# Patient Record
Sex: Female | Born: 1935 | Race: White | Hispanic: No | Smoking: Never smoker
Health system: Southern US, Community
[De-identification: ages and names within clinical notes are randomized; demographics above are authoritative.]

## PROBLEM LIST (undated history)

## (undated) ENCOUNTER — Emergency Department (HOSPITAL_COMMUNITY): Admission: EM | Payer: Medicare Other | Source: Home / Self Care

## (undated) DIAGNOSIS — M1990 Unspecified osteoarthritis, unspecified site: Secondary | ICD-10-CM

## (undated) DIAGNOSIS — I482 Chronic atrial fibrillation, unspecified: Secondary | ICD-10-CM

## (undated) DIAGNOSIS — I517 Cardiomegaly: Secondary | ICD-10-CM

## (undated) DIAGNOSIS — I3139 Other pericardial effusion (noninflammatory): Secondary | ICD-10-CM

## (undated) DIAGNOSIS — I495 Sick sinus syndrome: Secondary | ICD-10-CM

## (undated) DIAGNOSIS — I639 Cerebral infarction, unspecified: Secondary | ICD-10-CM

## (undated) DIAGNOSIS — R7611 Nonspecific reaction to tuberculin skin test without active tuberculosis: Secondary | ICD-10-CM

## (undated) DIAGNOSIS — Z8619 Personal history of other infectious and parasitic diseases: Secondary | ICD-10-CM

## (undated) DIAGNOSIS — I313 Pericardial effusion (noninflammatory): Secondary | ICD-10-CM

## (undated) DIAGNOSIS — D649 Anemia, unspecified: Secondary | ICD-10-CM

## (undated) DIAGNOSIS — E669 Obesity, unspecified: Secondary | ICD-10-CM

## (undated) DIAGNOSIS — I5032 Chronic diastolic (congestive) heart failure: Secondary | ICD-10-CM

## (undated) DIAGNOSIS — I10 Essential (primary) hypertension: Secondary | ICD-10-CM

## (undated) DIAGNOSIS — S72002A Fracture of unspecified part of neck of left femur, initial encounter for closed fracture: Secondary | ICD-10-CM

## (undated) DIAGNOSIS — G4730 Sleep apnea, unspecified: Secondary | ICD-10-CM

## (undated) DIAGNOSIS — Z8719 Personal history of other diseases of the digestive system: Secondary | ICD-10-CM

## (undated) HISTORY — DX: Essential (primary) hypertension: I10

## (undated) HISTORY — DX: Other pericardial effusion (noninflammatory): I31.39

## (undated) HISTORY — DX: Pericardial effusion (noninflammatory): I31.3

## (undated) HISTORY — DX: Anemia, unspecified: D64.9

## (undated) HISTORY — DX: Cardiomegaly: I51.7

## (undated) HISTORY — DX: Obesity, unspecified: E66.9

## (undated) HISTORY — DX: Personal history of other diseases of the digestive system: Z87.19

## (undated) HISTORY — DX: Sleep apnea, unspecified: G47.30

## (undated) HISTORY — PX: BREAST LUMPECTOMY: SHX2

## (undated) HISTORY — PX: CATARACT EXTRACTION W/ INTRAOCULAR LENS  IMPLANT, BILATERAL: SHX1307

---
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
## 1998-03-06 ENCOUNTER — Ambulatory Visit (HOSPITAL_COMMUNITY): Admission: RE | Admit: 1998-03-06 | Discharge: 1998-03-06 | Payer: Self-pay | Admitting: Emergency Medicine

## 1998-08-29 ENCOUNTER — Encounter (HOSPITAL_COMMUNITY): Admission: RE | Admit: 1998-08-29 | Discharge: 1998-11-27 | Payer: Self-pay | Admitting: Internal Medicine

## 1998-09-01 ENCOUNTER — Emergency Department (HOSPITAL_COMMUNITY): Admission: EM | Admit: 1998-09-01 | Discharge: 1998-09-01 | Payer: Self-pay | Admitting: Emergency Medicine

## 1998-09-05 ENCOUNTER — Ambulatory Visit (HOSPITAL_COMMUNITY): Admission: RE | Admit: 1998-09-05 | Discharge: 1998-09-05 | Payer: Self-pay | Admitting: Internal Medicine

## 1999-01-27 HISTORY — PX: INSERT / REPLACE / REMOVE PACEMAKER: SUR710

## 1999-02-06 ENCOUNTER — Emergency Department (HOSPITAL_COMMUNITY): Admission: EM | Admit: 1999-02-06 | Discharge: 1999-02-06 | Payer: Self-pay

## 1999-03-27 ENCOUNTER — Ambulatory Visit (HOSPITAL_COMMUNITY): Admission: RE | Admit: 1999-03-27 | Discharge: 1999-03-27 | Payer: Self-pay | Admitting: Gastroenterology

## 1999-03-28 ENCOUNTER — Ambulatory Visit (HOSPITAL_COMMUNITY): Admission: RE | Admit: 1999-03-28 | Discharge: 1999-03-28 | Payer: Self-pay | Admitting: Gastroenterology

## 1999-03-28 ENCOUNTER — Encounter (INDEPENDENT_AMBULATORY_CARE_PROVIDER_SITE_OTHER): Payer: Self-pay | Admitting: Specialist

## 1999-04-10 ENCOUNTER — Ambulatory Visit (HOSPITAL_COMMUNITY): Admission: RE | Admit: 1999-04-10 | Discharge: 1999-04-10 | Payer: Self-pay | Admitting: Emergency Medicine

## 1999-04-10 ENCOUNTER — Encounter: Payer: Self-pay | Admitting: Emergency Medicine

## 1999-06-11 ENCOUNTER — Ambulatory Visit (HOSPITAL_COMMUNITY): Admission: RE | Admit: 1999-06-11 | Discharge: 1999-06-12 | Payer: Self-pay | Admitting: Cardiology

## 1999-06-12 ENCOUNTER — Encounter: Payer: Self-pay | Admitting: Cardiology

## 2000-01-07 ENCOUNTER — Ambulatory Visit (HOSPITAL_COMMUNITY): Admission: RE | Admit: 2000-01-07 | Discharge: 2000-01-07 | Payer: Self-pay | Admitting: Gastroenterology

## 2000-01-09 ENCOUNTER — Ambulatory Visit (HOSPITAL_COMMUNITY): Admission: RE | Admit: 2000-01-09 | Discharge: 2000-01-09 | Payer: Self-pay | Admitting: Gastroenterology

## 2000-01-09 ENCOUNTER — Encounter: Payer: Self-pay | Admitting: Gastroenterology

## 2000-04-26 ENCOUNTER — Inpatient Hospital Stay (HOSPITAL_COMMUNITY): Admission: EM | Admit: 2000-04-26 | Discharge: 2000-04-28 | Payer: Self-pay | Admitting: Emergency Medicine

## 2000-09-16 ENCOUNTER — Encounter: Payer: Self-pay | Admitting: Gastroenterology

## 2000-09-16 ENCOUNTER — Ambulatory Visit (HOSPITAL_COMMUNITY): Admission: RE | Admit: 2000-09-16 | Discharge: 2000-09-16 | Payer: Self-pay | Admitting: Gastroenterology

## 2000-10-13 ENCOUNTER — Ambulatory Visit (HOSPITAL_COMMUNITY): Admission: RE | Admit: 2000-10-13 | Discharge: 2000-10-13 | Payer: Self-pay | Admitting: Emergency Medicine

## 2000-10-29 ENCOUNTER — Ambulatory Visit (HOSPITAL_COMMUNITY): Admission: RE | Admit: 2000-10-29 | Discharge: 2000-10-29 | Payer: Self-pay | Admitting: Gastroenterology

## 2001-08-10 ENCOUNTER — Ambulatory Visit (HOSPITAL_COMMUNITY): Admission: RE | Admit: 2001-08-10 | Discharge: 2001-08-10 | Payer: Self-pay | Admitting: Specialist

## 2001-09-14 ENCOUNTER — Ambulatory Visit (HOSPITAL_COMMUNITY): Admission: RE | Admit: 2001-09-14 | Discharge: 2001-09-14 | Payer: Self-pay | Admitting: Specialist

## 2001-10-21 ENCOUNTER — Observation Stay (HOSPITAL_COMMUNITY): Admission: EM | Admit: 2001-10-21 | Discharge: 2001-10-22 | Payer: Self-pay | Admitting: Emergency Medicine

## 2001-11-09 ENCOUNTER — Ambulatory Visit (HOSPITAL_COMMUNITY): Admission: RE | Admit: 2001-11-09 | Discharge: 2001-11-09 | Payer: Self-pay | Admitting: *Deleted

## 2002-06-29 ENCOUNTER — Encounter: Payer: Self-pay | Admitting: Obstetrics and Gynecology

## 2002-06-29 ENCOUNTER — Ambulatory Visit (HOSPITAL_COMMUNITY): Admission: RE | Admit: 2002-06-29 | Discharge: 2002-06-29 | Payer: Self-pay | Admitting: Obstetrics and Gynecology

## 2002-11-16 ENCOUNTER — Encounter: Payer: Self-pay | Admitting: Emergency Medicine

## 2002-11-16 ENCOUNTER — Ambulatory Visit (HOSPITAL_COMMUNITY): Admission: RE | Admit: 2002-11-16 | Discharge: 2002-11-16 | Payer: Self-pay

## 2003-01-27 HISTORY — PX: CHOLECYSTECTOMY: SHX55

## 2003-04-23 ENCOUNTER — Encounter: Admission: RE | Admit: 2003-04-23 | Discharge: 2003-04-23 | Payer: Self-pay | Admitting: Emergency Medicine

## 2003-04-24 ENCOUNTER — Ambulatory Visit (HOSPITAL_COMMUNITY): Admission: RE | Admit: 2003-04-24 | Discharge: 2003-04-24 | Payer: Self-pay | Admitting: Cardiology

## 2003-06-01 ENCOUNTER — Ambulatory Visit (HOSPITAL_COMMUNITY): Admission: RE | Admit: 2003-06-01 | Discharge: 2003-06-01 | Payer: Self-pay | Admitting: Cardiology

## 2003-06-18 ENCOUNTER — Observation Stay (HOSPITAL_COMMUNITY): Admission: EM | Admit: 2003-06-18 | Discharge: 2003-06-19 | Payer: Self-pay | Admitting: Emergency Medicine

## 2003-06-21 ENCOUNTER — Encounter: Admission: RE | Admit: 2003-06-21 | Discharge: 2003-06-21 | Payer: Self-pay | Admitting: Family Medicine

## 2003-11-29 ENCOUNTER — Ambulatory Visit (HOSPITAL_COMMUNITY): Admission: RE | Admit: 2003-11-29 | Discharge: 2003-11-29 | Payer: Self-pay | Admitting: Emergency Medicine

## 2003-12-27 ENCOUNTER — Ambulatory Visit (HOSPITAL_COMMUNITY): Admission: RE | Admit: 2003-12-27 | Discharge: 2003-12-27 | Payer: Self-pay | Admitting: Cardiology

## 2004-01-01 ENCOUNTER — Ambulatory Visit (HOSPITAL_COMMUNITY): Admission: RE | Admit: 2004-01-01 | Discharge: 2004-01-01 | Payer: Self-pay | Admitting: Gastroenterology

## 2004-03-14 ENCOUNTER — Encounter: Admission: RE | Admit: 2004-03-14 | Discharge: 2004-03-14 | Payer: Self-pay | Admitting: Gastroenterology

## 2004-03-27 ENCOUNTER — Encounter (INDEPENDENT_AMBULATORY_CARE_PROVIDER_SITE_OTHER): Payer: Self-pay | Admitting: Specialist

## 2004-03-27 ENCOUNTER — Observation Stay (HOSPITAL_COMMUNITY): Admission: RE | Admit: 2004-03-27 | Discharge: 2004-03-28 | Payer: Self-pay | Admitting: General Surgery

## 2004-06-09 ENCOUNTER — Ambulatory Visit: Payer: Self-pay | Admitting: Hematology & Oncology

## 2004-08-07 ENCOUNTER — Ambulatory Visit: Payer: Self-pay | Admitting: Hematology & Oncology

## 2004-12-01 ENCOUNTER — Ambulatory Visit (HOSPITAL_COMMUNITY): Admission: RE | Admit: 2004-12-01 | Discharge: 2004-12-01 | Payer: Self-pay | Admitting: Emergency Medicine

## 2004-12-15 ENCOUNTER — Ambulatory Visit (HOSPITAL_COMMUNITY): Admission: RE | Admit: 2004-12-15 | Discharge: 2004-12-15 | Payer: Self-pay | Admitting: Cardiology

## 2005-02-14 ENCOUNTER — Emergency Department (HOSPITAL_COMMUNITY): Admission: EM | Admit: 2005-02-14 | Discharge: 2005-02-14 | Payer: Self-pay | Admitting: Emergency Medicine

## 2005-12-22 ENCOUNTER — Ambulatory Visit (HOSPITAL_COMMUNITY): Admission: RE | Admit: 2005-12-22 | Discharge: 2005-12-22 | Payer: Self-pay | Admitting: Emergency Medicine

## 2006-02-18 ENCOUNTER — Ambulatory Visit: Payer: Self-pay | Admitting: Family Medicine

## 2006-02-18 ENCOUNTER — Inpatient Hospital Stay (HOSPITAL_COMMUNITY): Admission: EM | Admit: 2006-02-18 | Discharge: 2006-02-19 | Payer: Self-pay | Admitting: Emergency Medicine

## 2006-04-02 ENCOUNTER — Ambulatory Visit (HOSPITAL_COMMUNITY): Admission: RE | Admit: 2006-04-02 | Discharge: 2006-04-02 | Payer: Self-pay | Admitting: Cardiology

## 2006-04-26 ENCOUNTER — Ambulatory Visit: Payer: Self-pay | Admitting: Internal Medicine

## 2006-12-31 ENCOUNTER — Ambulatory Visit (HOSPITAL_COMMUNITY): Admission: RE | Admit: 2006-12-31 | Discharge: 2006-12-31 | Payer: Self-pay | Admitting: Emergency Medicine

## 2007-01-05 ENCOUNTER — Encounter: Admission: RE | Admit: 2007-01-05 | Discharge: 2007-01-05 | Payer: Self-pay | Admitting: Emergency Medicine

## 2007-06-30 ENCOUNTER — Ambulatory Visit: Payer: Self-pay | Admitting: Surgery

## 2008-01-04 ENCOUNTER — Ambulatory Visit (HOSPITAL_COMMUNITY): Admission: RE | Admit: 2008-01-04 | Discharge: 2008-01-04 | Payer: Self-pay | Admitting: Emergency Medicine

## 2009-01-04 ENCOUNTER — Ambulatory Visit (HOSPITAL_COMMUNITY): Admission: RE | Admit: 2009-01-04 | Discharge: 2009-01-04 | Payer: Self-pay | Admitting: Emergency Medicine

## 2009-09-03 ENCOUNTER — Ambulatory Visit: Payer: Self-pay | Admitting: *Deleted

## 2009-09-18 ENCOUNTER — Ambulatory Visit: Payer: Self-pay | Admitting: *Deleted

## 2009-10-02 ENCOUNTER — Ambulatory Visit: Payer: Self-pay | Admitting: Cardiology

## 2009-10-16 ENCOUNTER — Ambulatory Visit: Payer: Self-pay | Admitting: Cardiology

## 2009-10-30 ENCOUNTER — Ambulatory Visit: Payer: Self-pay | Admitting: Cardiology

## 2009-11-13 ENCOUNTER — Ambulatory Visit: Payer: Self-pay | Admitting: Cardiology

## 2009-11-22 ENCOUNTER — Ambulatory Visit: Payer: Self-pay | Admitting: Cardiovascular Disease

## 2009-12-10 ENCOUNTER — Ambulatory Visit: Payer: Self-pay | Admitting: Cardiology

## 2009-12-24 ENCOUNTER — Ambulatory Visit: Payer: Self-pay | Admitting: Cardiology

## 2009-12-28 ENCOUNTER — Encounter: Payer: Self-pay | Admitting: Internal Medicine

## 2010-01-08 ENCOUNTER — Ambulatory Visit: Payer: Self-pay | Admitting: Cardiology

## 2010-01-08 ENCOUNTER — Ambulatory Visit (HOSPITAL_COMMUNITY)
Admission: RE | Admit: 2010-01-08 | Discharge: 2010-01-08 | Payer: Self-pay | Source: Home / Self Care | Attending: Emergency Medicine | Admitting: Emergency Medicine

## 2010-01-22 ENCOUNTER — Ambulatory Visit: Payer: Self-pay | Admitting: Cardiology

## 2010-02-06 ENCOUNTER — Ambulatory Visit: Payer: Self-pay | Admitting: Cardiology

## 2010-02-16 ENCOUNTER — Encounter: Payer: Self-pay | Admitting: Emergency Medicine

## 2010-02-24 ENCOUNTER — Ambulatory Visit: Payer: Self-pay | Admitting: Cardiology

## 2010-02-25 NOTE — Miscellaneous (Signed)
Summary: Device preload  Clinical Lists Changes  Observations: Added new observation of PPM INDICATN: A-fib (12/28/2009 11:41) Added new observation of MAGNET RTE: BOL 85 ERI 65 (12/28/2009 11:41) Added new observation of PPMLEADSTAT2: active (12/28/2009 11:41) Added new observation of PPMLEADSER2: [id number]  (12/28/2009 11:41) Added new observation of PPMLEADMOD2: 4470  (12/28/2009 11:41) Added new observation of PPMLEADDOI2: 06/11/1999  (12/28/2009 11:41) Added new observation of PPMLEADLOC2: RV  (12/28/2009 11:41) Added new observation of PPMLEADSTAT1: active  (12/28/2009 11:41) Added new observation of PPMLEADSER1: [id number]  (12/28/2009 11:41) Added new observation of PPMLEADMOD1: 4469  (12/28/2009 11:41) Added new observation of PPMLEADDOI1: 06/11/1999  (12/28/2009 11:41) Added new observation of PPMLEADLOC1: RA  (12/28/2009 11:41) Added new observation of PPM DOI: 12/15/2004  (12/28/2009 11:41) Added new observation of PPM SERL#: [id number]  (12/28/2009 11:41) Added new observation of PPM MODL#: [id number]  (12/28/2009 38:18) Added new observation of PACEMAKERMFG: [employer]  (12/28/2009 11:41) Added new observation of PPM IMP MD: Duffy Rhody Tennant,MD  (12/28/2009 11:41) Added new observation of PPM REFER MD: Rolla Plate  (12/28/2009 11:41) Added new observation of PACEMAKER MD: Lewayne Bunting, MD  (12/28/2009 11:41)      PPM Specifications Following MD:  Lewayne Bunting, MD     Referring MD:  Rolla Plate PPM Vendor:  [employer]onic     PPM Model Number:  [id number]     PPM Serial Number:  [id number] PPM DOI:  12/15/2004     PPM Implanting MD:  Rolla Plate  Lead 1    Location: RA     DOI: 06/11/1999     Model #: [id number]     Serial #: [id number]     Status: active Lead 2    Location: RV     DOI: 06/11/1999     Model #: [id number]     Serial #: [id number]     Status: active  Magnet Response Rate:  BOL 85 ERI 65  Indications:  A-fib

## 2010-03-14 ENCOUNTER — Other Ambulatory Visit (INDEPENDENT_AMBULATORY_CARE_PROVIDER_SITE_OTHER): Payer: Medicare Other

## 2010-03-14 DIAGNOSIS — I319 Disease of pericardium, unspecified: Secondary | ICD-10-CM

## 2010-03-14 DIAGNOSIS — I4891 Unspecified atrial fibrillation: Secondary | ICD-10-CM

## 2010-03-14 DIAGNOSIS — Z7901 Long term (current) use of anticoagulants: Secondary | ICD-10-CM

## 2010-03-25 ENCOUNTER — Encounter (INDEPENDENT_AMBULATORY_CARE_PROVIDER_SITE_OTHER): Payer: Self-pay | Admitting: *Deleted

## 2010-03-25 ENCOUNTER — Ambulatory Visit (INDEPENDENT_AMBULATORY_CARE_PROVIDER_SITE_OTHER): Payer: Medicare Other | Admitting: Nurse Practitioner

## 2010-03-25 DIAGNOSIS — I10 Essential (primary) hypertension: Secondary | ICD-10-CM

## 2010-03-25 DIAGNOSIS — Z7901 Long term (current) use of anticoagulants: Secondary | ICD-10-CM

## 2010-03-25 DIAGNOSIS — I4891 Unspecified atrial fibrillation: Secondary | ICD-10-CM

## 2010-03-25 DIAGNOSIS — Z950 Presence of cardiac pacemaker: Secondary | ICD-10-CM

## 2010-04-03 NOTE — Letter (Signed)
Summary: Generic Letter  Architectural technologist, Main Office  [address]   [place], [place] [address]   Phone: [phone]  Fax: [phone]        March 25, 2010 MRN: [id number]    [facility] [address] [place], [place]  [address]    Dear [name],       I hope this letter finds you well.  I recieved a phone call from Dr Angelina Pih office today inquiring about your follow up on your pacemaker.   We have you down for an appointment to see Dr Sharrell Ku on April 08, 2010  at  11:20 am.   We ask that you arrive about 15 minutes before your appointment to register.   If this time is inconvient for any reason please give [place] a call to reschedule at  [phone].   Letta Moynahan, EMT  March 25, 2010 11:12 AM     Sincerely,  Letta Moynahan, EMT  This letter has been electronically signed by your physician.

## 2010-04-08 ENCOUNTER — Encounter: Payer: Self-pay | Admitting: Internal Medicine

## 2010-04-14 ENCOUNTER — Encounter: Payer: Self-pay | Admitting: *Deleted

## 2010-04-14 DIAGNOSIS — I4821 Permanent atrial fibrillation: Secondary | ICD-10-CM | POA: Insufficient documentation

## 2010-04-14 DIAGNOSIS — I4891 Unspecified atrial fibrillation: Secondary | ICD-10-CM

## 2010-04-15 ENCOUNTER — Encounter: Payer: Medicare Other | Admitting: *Deleted

## 2010-04-16 ENCOUNTER — Other Ambulatory Visit: Payer: Self-pay | Admitting: Cardiology

## 2010-04-16 ENCOUNTER — Ambulatory Visit (INDEPENDENT_AMBULATORY_CARE_PROVIDER_SITE_OTHER): Payer: Medicare Other | Admitting: *Deleted

## 2010-04-16 DIAGNOSIS — Z79899 Other long term (current) drug therapy: Secondary | ICD-10-CM

## 2010-04-16 DIAGNOSIS — I4891 Unspecified atrial fibrillation: Secondary | ICD-10-CM

## 2010-04-16 DIAGNOSIS — Z7901 Long term (current) use of anticoagulants: Secondary | ICD-10-CM

## 2010-04-16 LAB — POCT INR: INR: 2.2

## 2010-04-17 ENCOUNTER — Other Ambulatory Visit: Payer: Self-pay | Admitting: *Deleted

## 2010-04-17 ENCOUNTER — Telehealth: Payer: Self-pay | Admitting: *Deleted

## 2010-04-17 DIAGNOSIS — I4891 Unspecified atrial fibrillation: Secondary | ICD-10-CM

## 2010-04-17 LAB — DIGOXIN LEVEL: Digoxin Level: 1.6 ng/mL (ref 0.8–2.0)

## 2010-04-17 MED ORDER — DIGOXIN 125 MCG PO TABS: 125.0000 ug | ORAL_TABLET | Freq: Two times a day (BID) | ORAL | Status: DC

## 2010-04-17 NOTE — Telephone Encounter (Signed)
Pt notified of Digoxin level and will continue same dose of Digoxin.

## 2010-04-17 NOTE — Telephone Encounter (Signed)
Message copied by Barnetta Hammersmith on Thu Apr 17, 2010  9:34 AM ------      Message from: Norma Fredrickson      Created: Thu Apr 17, 2010  8:23 AM       Dig level is ok. Continue same dose.

## 2010-04-23 ENCOUNTER — Telehealth: Payer: Self-pay | Admitting: *Deleted

## 2010-04-23 NOTE — Telephone Encounter (Signed)
Message copied by Barnetta Hammersmith on Wed Apr 23, 2010  5:07 PM ------      Message from: Neil Crouch      Created: Wed Apr 23, 2010  3:09 PM      Regarding: TENDER PLACE       Contact: [phone]       HAS A TENDER PLACE ON THE INSIDE OF HER THIGH AND WANTS TO TALK WITH YOU ABOUT IT.

## 2010-04-23 NOTE — Telephone Encounter (Signed)
Pt called, c/o tender place on inside of thigh, feet swelling, and more palpitations.  Pt told to come in for office visit.  RN set pt up for 04/24/10 at 230pm.

## 2010-04-24 ENCOUNTER — Encounter: Payer: Self-pay | Admitting: Nurse Practitioner

## 2010-04-24 ENCOUNTER — Ambulatory Visit (INDEPENDENT_AMBULATORY_CARE_PROVIDER_SITE_OTHER): Payer: Medicare Other | Admitting: Nurse Practitioner

## 2010-04-24 VITALS — BP 128/86 | HR 74 | Ht 65.0 in | Wt 167.2 lb

## 2010-04-24 DIAGNOSIS — I313 Pericardial effusion (noninflammatory): Secondary | ICD-10-CM | POA: Insufficient documentation

## 2010-04-24 DIAGNOSIS — I319 Disease of pericardium, unspecified: Secondary | ICD-10-CM

## 2010-04-24 DIAGNOSIS — I4891 Unspecified atrial fibrillation: Secondary | ICD-10-CM

## 2010-04-24 DIAGNOSIS — Z7901 Long term (current) use of anticoagulants: Secondary | ICD-10-CM | POA: Insufficient documentation

## 2010-04-24 DIAGNOSIS — R609 Edema, unspecified: Secondary | ICD-10-CM

## 2010-04-24 DIAGNOSIS — I10 Essential (primary) hypertension: Secondary | ICD-10-CM

## 2010-04-24 LAB — BASIC METABOLIC PANEL
BUN: 18 mg/dL (ref 6–23)
CO2: 35 mEq/L — ABNORMAL HIGH (ref 19–32)
Calcium: 10 mg/dL (ref 8.4–10.5)
Chloride: 101 mEq/L (ref 96–112)
Creatinine, Ser: 1 mg/dL (ref 0.4–1.2)
GFR: 58.68 mL/min — ABNORMAL LOW (ref >60.00)
Glucose, Bld: 98 mg/dL (ref 70–99)
Potassium: 4.6 mEq/L (ref 3.5–5.1)
Sodium: 144 mEq/L (ref 135–145)

## 2010-04-24 MED ORDER — TORSEMIDE 20 MG PO TABS: 20.0000 mg | ORAL_TABLET | Freq: Every day | ORAL | Status: DC

## 2010-04-24 NOTE — Assessment & Plan Note (Signed)
She is not going to wear support stockings. She wants to switch and try a different diuretic. We will try her on Demedex 20mg  per day and stop the Lasix for now. I encouraged her to watch her salt.

## 2010-04-24 NOTE — Assessment & Plan Note (Addendum)
This has been chronic. Her last echo was in July of last year. We will recheck. Clinically she does not have evidence of tamponade.

## 2010-04-24 NOTE — Progress Notes (Signed)
History of Present Illness: [name] is seen today for a work in visit. She is seen for Dr. Deborah [name]. Her feet and legs have been swelling for the past couple of weeks. She does not wear support stockings. She tries to watch her salt. She does have some DOE but that seems stable. She has tried some extra fluid pills but notes no improvement. She is interested in a different diuretic. She denies chest pain. She is not dizzy and has had no syncope.   Current Outpatient Prescriptions on File Prior to Visit  Medication Sig Dispense Refill  . Calcium Carbonate-Vitamin D (CALCIUM + D PO) Take by mouth daily.        . diphenhydrAMINE (SOMINEX) 25 MG tablet Take 25 mg by mouth daily.        . furosemide (LASIX) 40 MG tablet Take 40 mg by mouth daily. Taking 2 tablets each morning       . multivitamin (THERAGRAN) per tablet Take 1 tablet by mouth daily.        . potassium chloride (KLOR-CON) 10 MEQ CR tablet Take 10 mEq by mouth daily.        . Thiamine HCl (VITAMIN B-1 PO) Take by mouth daily.        . verapamil (CALAN-SR) 240 MG CR tablet Take 240 mg by mouth at bedtime.        Marland Kitchen warfarin (COUMADIN) 5 MG tablet Take 5 mg by mouth daily. As directed. Currently taking 5mg  x 6 days and 2.5mg  x 1 day.       . torsemide (DEMADEX) 20 MG tablet Take 1 tablet (20 mg total) by mouth daily.  30 tablet  11  . DISCONTD: digoxin (LANOXIN) 0.125 MG tablet Take 1 tablet (125 mcg total) by mouth 2 (two) times daily.  60 tablet  6    No Known Allergies  Past Medical History  Diagnosis Date  . Pericardial effusion   . Atrial fibrillation   . Chronic anticoagulation   . History of GI bleed   . HTN (hypertension)   . Sleep apnea   . Pacemaker   . Valvular heart disease     Past Surgical History  Procedure Date  . Pacemaker insertion     Last generator in 2006  . Breast lumpectomy     bilaterally  . Tonsillectomy   . Adenoidectomy     History  Smoking status  . Never Smoker   Smokeless tobacco  .  Never Used    History  Alcohol Use No    Family History  Problem Relation Age of Onset  . Stroke Mother   . Stroke Father   . Pneumonia Father     Review of Systems: The review of systems is positive for constipation. She tries to stay active. She is tolerating her meds.  All other systems were reviewed and are negative.  Physical Exam: BP 128/86  Pulse 74  Ht 5\' 5"  (1.651 m)  Wt 167 lb 3.2 oz (75.841 kg)  BMI 27.82 kg/m2 She is pleasant and in no acute distress. Skin is warm and dry. Color is normal. HEENT is unremarkable. She is a little hard of hearing. Lungs are clear. Cardiac exam shows an irregular rhythm. Her rate is controlled. She has a soft outflow murmur. Abdomen is obese. She has about 1+ edema of the lower legs. Gait and ROM are intact. She has no gross neurologic deficits.    Assessment / Plan:

## 2010-04-24 NOTE — Patient Instructions (Addendum)
Would encourage support stockings Minimize your salt Stop the Lasix and we will try the Demedex 20mg  one a day. We will repeat an ultrasound of your heart. We need to check some labs today.

## 2010-04-24 NOTE — Assessment & Plan Note (Signed)
Blood pressure is ok. We will continue with her current meds.

## 2010-04-24 NOTE — Assessment & Plan Note (Signed)
Rate is controlled. She is maintained on Coumadin.

## 2010-04-28 ENCOUNTER — Telehealth: Payer: Self-pay | Admitting: *Deleted

## 2010-04-28 NOTE — Telephone Encounter (Signed)
Tried to contact pt with lab results.  No machine available to leave a mess.  Will cont, to call,

## 2010-04-29 ENCOUNTER — Ambulatory Visit (INDEPENDENT_AMBULATORY_CARE_PROVIDER_SITE_OTHER): Payer: Medicare Other | Admitting: *Deleted

## 2010-04-29 ENCOUNTER — Telehealth: Payer: Self-pay | Admitting: *Deleted

## 2010-04-29 DIAGNOSIS — Z7901 Long term (current) use of anticoagulants: Secondary | ICD-10-CM

## 2010-04-29 DIAGNOSIS — I4891 Unspecified atrial fibrillation: Secondary | ICD-10-CM

## 2010-04-29 NOTE — Telephone Encounter (Signed)
Pt notified of lab results and will f/u with Lawson Fiscal in three weeks for office visit and BMET.

## 2010-04-29 NOTE — Telephone Encounter (Signed)
Message copied by Barnetta Hammersmith on Tue Apr 29, 2010  8:20 AM ------      Message from: Norma Fredrickson      Created: Fri Apr 25, 2010  8:01 AM       Ok to report. Labs are satisfactory. Will recheck on return visit. She has been switched from Lasix to Demedex.

## 2010-05-07 ENCOUNTER — Ambulatory Visit (HOSPITAL_COMMUNITY): Payer: Medicare Other | Attending: Cardiology | Admitting: Radiology

## 2010-05-07 DIAGNOSIS — R609 Edema, unspecified: Secondary | ICD-10-CM | POA: Insufficient documentation

## 2010-05-07 DIAGNOSIS — I319 Disease of pericardium, unspecified: Secondary | ICD-10-CM | POA: Insufficient documentation

## 2010-05-07 DIAGNOSIS — R0989 Other specified symptoms and signs involving the circulatory and respiratory systems: Secondary | ICD-10-CM | POA: Insufficient documentation

## 2010-05-07 DIAGNOSIS — I079 Rheumatic tricuspid valve disease, unspecified: Secondary | ICD-10-CM | POA: Insufficient documentation

## 2010-05-07 DIAGNOSIS — I313 Pericardial effusion (noninflammatory): Secondary | ICD-10-CM

## 2010-05-07 DIAGNOSIS — R0609 Other forms of dyspnea: Secondary | ICD-10-CM | POA: Insufficient documentation

## 2010-05-07 DIAGNOSIS — I379 Nonrheumatic pulmonary valve disorder, unspecified: Secondary | ICD-10-CM | POA: Insufficient documentation

## 2010-05-07 DIAGNOSIS — I080 Rheumatic disorders of both mitral and aortic valves: Secondary | ICD-10-CM | POA: Insufficient documentation

## 2010-05-08 ENCOUNTER — Telehealth: Payer: Self-pay | Admitting: *Deleted

## 2010-05-08 NOTE — Telephone Encounter (Signed)
Message copied by Barnetta Hammersmith on Thu May 08, 2010  9:02 AM ------      Message from: Norma Fredrickson      Created: Wed May 07, 2010  3:52 PM       Echo looks ok. Will discuss further at follow up office visit.

## 2010-05-08 NOTE — Telephone Encounter (Signed)
Pt notified of echo results and will f/u with Lawson Fiscal next week.

## 2010-05-12 ENCOUNTER — Other Ambulatory Visit: Payer: Self-pay | Admitting: *Deleted

## 2010-05-12 DIAGNOSIS — Z79899 Other long term (current) drug therapy: Secondary | ICD-10-CM

## 2010-05-12 DIAGNOSIS — I4891 Unspecified atrial fibrillation: Secondary | ICD-10-CM

## 2010-05-12 MED ORDER — VERAPAMIL HCL 240 MG PO TBCR: 240.0000 mg | EXTENDED_RELEASE_TABLET | Freq: Every day | ORAL | Status: DC

## 2010-05-15 ENCOUNTER — Encounter: Payer: Self-pay | Admitting: Nurse Practitioner

## 2010-05-15 ENCOUNTER — Other Ambulatory Visit (INDEPENDENT_AMBULATORY_CARE_PROVIDER_SITE_OTHER): Payer: Medicare Other | Admitting: *Deleted

## 2010-05-15 ENCOUNTER — Ambulatory Visit (INDEPENDENT_AMBULATORY_CARE_PROVIDER_SITE_OTHER): Payer: Medicare Other | Admitting: Nurse Practitioner

## 2010-05-15 VITALS — BP 140/70 | HR 84 | Ht 65.0 in | Wt 163.0 lb

## 2010-05-15 DIAGNOSIS — I313 Pericardial effusion (noninflammatory): Secondary | ICD-10-CM

## 2010-05-15 DIAGNOSIS — Z7901 Long term (current) use of anticoagulants: Secondary | ICD-10-CM

## 2010-05-15 DIAGNOSIS — I319 Disease of pericardium, unspecified: Secondary | ICD-10-CM

## 2010-05-15 DIAGNOSIS — R609 Edema, unspecified: Secondary | ICD-10-CM

## 2010-05-15 DIAGNOSIS — I4891 Unspecified atrial fibrillation: Secondary | ICD-10-CM

## 2010-05-15 DIAGNOSIS — I10 Essential (primary) hypertension: Secondary | ICD-10-CM

## 2010-05-15 DIAGNOSIS — Z79899 Other long term (current) drug therapy: Secondary | ICD-10-CM

## 2010-05-15 LAB — BASIC METABOLIC PANEL
BUN: 18 mg/dL (ref 6–23)
CO2: 33 mEq/L — ABNORMAL HIGH (ref 19–32)
Calcium: 9.4 mg/dL (ref 8.4–10.5)
Chloride: 100 mEq/L (ref 96–112)
Creatinine, Ser: 1 mg/dL (ref 0.4–1.2)
GFR: 58.67 mL/min — ABNORMAL LOW (ref >60.00)
Glucose, Bld: 119 mg/dL — ABNORMAL HIGH (ref 70–99)
Potassium: 4 mEq/L (ref 3.5–5.1)
Sodium: 142 mEq/L (ref 135–145)

## 2010-05-15 NOTE — Progress Notes (Signed)
Eugene Garnet Date of Birth: 1923/04/29   History of Present Illness: [name] is seen back today for a 3 week check. She is seen for Dr. Deborah Chalk. She wanted to switch from Lasix to Demedex for her edema. She has had a follow up echo. Her pericardial effusion was trivial. She has moderate LVH. Her EF is normal. She is feeling pretty good. She is not interested in wearing support stockings. She does not have chest pain. She is currently happy with how she is feeling. She is considering going to the [place] to start swimming.   Current Outpatient Prescriptions on File Prior to Visit  Medication Sig Dispense Refill  . Calcium Carbonate-Vitamin D (CALCIUM + D PO) Take by mouth daily.        . digoxin (LANOXIN) 0.125 MG tablet Take 125 mcg by mouth daily. Taking one tablet in the morning and a one of a tablet in the evening      . diphenhydrAMINE (SOMINEX) 25 MG tablet Take 25 mg by mouth daily.        . multivitamin (THERAGRAN) per tablet Take 1 tablet by mouth daily.        . potassium chloride (KLOR-CON) 10 MEQ CR tablet Take 10 mEq by mouth daily.        . Thiamine HCl (VITAMIN B-1 PO) Take by mouth daily.        Marland Kitchen torsemide (DEMADEX) 20 MG tablet Take 1 tablet (20 mg total) by mouth daily.  30 tablet  11  . verapamil (CALAN-SR) 240 MG CR tablet Take 1 tablet (240 mg total) by mouth at bedtime.  30 tablet  4  . warfarin (COUMADIN) 5 MG tablet Take 5 mg by mouth daily. As directed. Currently taking 5mg  x 6 days and 2.5mg  x 1 day.       Marland Kitchen DISCONTD: digoxin (LANOXIN) 0.125 MG tablet Take 1 tablet (125 mcg total) by mouth 2 (two) times daily.  60 tablet  6  . DISCONTD: furosemide (LASIX) 40 MG tablet Take 40 mg by mouth daily. Taking 2 tablets each morning         No Known Allergies  Past Medical History  Diagnosis Date  . Pericardial effusion   . Atrial fibrillation   . Chronic anticoagulation   . History of GI bleed   . HTN (hypertension)   . Sleep apnea   . Pacemaker   . Valvular  heart disease     Past Surgical History  Procedure Date  . Pacemaker insertion     Last generator in 2006  . Breast lumpectomy     bilaterally  . Tonsillectomy   . Adenoidectomy     History  Smoking status  . Never Smoker   Smokeless tobacco  . Never Used    History  Alcohol Use No    Family History  Problem Relation Age of Onset  . Stroke Mother   . Stroke Father   . Pneumonia Father     Review of Systems: The review of systems is positive for occasional edema. No chest pain. She is not having significant palpitations.  All other systems were reviewed and are negative.  Physical Exam: BP 140/70  Pulse 84  Ht 5\' 5"  (1.651 m)  Wt 163 lb (73.936 kg)  BMI 27.12 kg/m2 Patient is very pleasant and in no acute distress. Skin is warm and dry. Color is normal.  HEENT is unremarkable. Normocephalic/atraumatic. PERRL. Sclera are nonicteric. Neck is supple. No masses. No  JVD. Lungs are clear. Cardiac exam shows an irregular rhythm. Her rate is controlled.  Abdomen is soft. Extremities are fleshy but no signficant edema. Gait and ROM are intact. No gross neurologic deficits noted.  LABORATORY DATA:  INR today is 1.9.   Assessment / Plan:

## 2010-05-15 NOTE — Patient Instructions (Signed)
Stay on your current medicines. Stay on the Demedex. I still encourage you to wear support stockings. Your ultrasound looks pretty good. I will see you back in about 3 months. We are going to check your coumadin level today.

## 2010-05-15 NOTE — Assessment & Plan Note (Signed)
Her edema seems improved. Weight is down about 3 pounds. She will stay on the Demedex for now. We will check a BMET today. I will see her back in about 3 months. Patient is agreeable to this plan and will call if any problems develop in the interim.

## 2010-05-15 NOTE — Assessment & Plan Note (Signed)
INR is ok today. She is to have her coumadin checked every 2 weeks due to history of GI bleeding.

## 2010-05-15 NOTE — Assessment & Plan Note (Signed)
Rate is controlled. We will continue with her current medicines.

## 2010-05-15 NOTE — Assessment & Plan Note (Signed)
Trivial per recent echo. We will continue to monitor.

## 2010-05-15 NOTE — Assessment & Plan Note (Signed)
Blood pressure is ok. She will stay on her current regimen.

## 2010-05-15 NOTE — Progress Notes (Signed)
Addended by: Carman Ching on: 05/15/2010 09:49 AM   Modules accepted: Orders

## 2010-05-16 ENCOUNTER — Telehealth: Payer: Self-pay | Admitting: *Deleted

## 2010-05-16 NOTE — Telephone Encounter (Signed)
Message copied by Barnetta Hammersmith on Fri May 16, 2010  2:53 PM ------      Message from: Norma Fredrickson      Created: Fri May 16, 2010  8:25 AM       Ok to report. Labs are satisfactory.

## 2010-05-16 NOTE — Telephone Encounter (Signed)
Pt notified of lab results and to continue same medications.   

## 2010-05-20 ENCOUNTER — Encounter: Payer: Self-pay | Admitting: Internal Medicine

## 2010-05-21 ENCOUNTER — Ambulatory Visit (INDEPENDENT_AMBULATORY_CARE_PROVIDER_SITE_OTHER): Payer: Medicare Other | Admitting: Internal Medicine

## 2010-05-21 ENCOUNTER — Encounter: Payer: Self-pay | Admitting: Internal Medicine

## 2010-05-21 DIAGNOSIS — I4891 Unspecified atrial fibrillation: Secondary | ICD-10-CM

## 2010-05-21 DIAGNOSIS — I495 Sick sinus syndrome: Secondary | ICD-10-CM

## 2010-05-21 DIAGNOSIS — I10 Essential (primary) hypertension: Secondary | ICD-10-CM

## 2010-05-21 DIAGNOSIS — Z950 Presence of cardiac pacemaker: Secondary | ICD-10-CM

## 2010-05-21 NOTE — Assessment & Plan Note (Signed)
Her symptoms are well controlled. She will continue her current medications.She we'll maintain a low-sodium diet.

## 2010-05-21 NOTE — Progress Notes (Signed)
HPI [name] is referred today for evaluation. She is a patient of Dr. Ronnald Nian and is followed by Norma Fredrickson. She has a history of atrial fibrillation, chronic pericardial effusion, hypertension, and sleep apnea. The patient has been intolerant to beta blockers. She gets blue toes with them. She has a history of bradycardia status post permanent pacemaker insertion. Her tachybradycardia syndrome appears to be well-controlled. She has a history of preserved left ventricular function by echocardiography.she denies a history of syncope. No chest pain or shortness of breath. Despite her advanced age, she remains active. She admits to trouble with insomnia. No Known Allergies   Current Outpatient Prescriptions  Medication Sig Dispense Refill  . Calcium Carbonate-Vitamin D (CALCIUM + D PO) Take by mouth daily.        . digoxin (LANOXIN) 0.125 MG tablet Take 125 mcg by mouth daily. Taking one tablet in the morning and a one of a tablet in the evening      . diphenhydrAMINE (BENADRYL) 25 MG tablet Take 25 mg by mouth every 6 (six) hours as needed.        . multivitamin (THERAGRAN) per tablet Take 1 tablet by mouth daily.        . potassium chloride (KLOR-CON) 10 MEQ CR tablet Take 10 mEq by mouth daily.        . Thiamine HCl (VITAMIN B-1 PO) Take by mouth daily.        Marland Kitchen torsemide (DEMADEX) 20 MG tablet Take 1 tablet (20 mg total) by mouth daily.  30 tablet  11  . verapamil (CALAN-SR) 240 MG CR tablet Take 1 tablet (240 mg total) by mouth at bedtime.  30 tablet  4  . warfarin (COUMADIN) 5 MG tablet Take 5 mg by mouth daily. As directed. Currently taking 5mg  x 6 days and 2.5mg  x 1 day.       Marland Kitchen DISCONTD: diphenhydrAMINE (SOMINEX) 25 MG tablet Take 25 mg by mouth daily.           Past Medical History  Diagnosis Date  . Pericardial effusion   . Atrial fibrillation   . Chronic anticoagulation   . History of GI bleed   . HTN (hypertension)   . Sleep apnea   . Pacemaker   . Valvular heart  disease     ROS:   All systems reviewed and negative except as noted in the HPI.   Past Surgical History  Procedure Date  . Pacemaker insertion     Last generator in 2006  . Breast lumpectomy     bilaterally  . Tonsillectomy   . Adenoidectomy      Family History  Problem Relation Age of Onset  . Stroke Mother   . Stroke Father   . Pneumonia Father      History   Social History  . Marital Status: Widowed    Spouse Name: N/A    Number of Children: N/A  . Years of Education: N/A   Occupational History  . Not on file.   Social History Main Topics  . Smoking status: Never Smoker   . Smokeless tobacco: Never Used  . Alcohol Use: No  . Drug Use: No  . Sexually Active: No   Other Topics Concern  . Not on file   Social History Narrative  . No narrative on file     BP 142/70  Pulse 60  Resp 14  Ht 5\' 5"  (1.651 m)  Wt 167 lb (75.751 kg)  BMI 27.79  kg/m2  Physical Exam:  Well appearing elderly woman NAD HEENT: Unremarkable Neck:  No JVD, no thyromegally Lymphatics:  No adenopathy Back:  No CVA tenderness Lungs:  Clear. Well-healed pacemaker incision HEART:  Regular rate rhythm, soft systolic murmur, no rubs, no clicks Abd:  Flat, positive bowel sounds, no organomegally, no rebound, no guarding Ext:  2 plus pulses, no edema, no cyanosis, no clubbing Skin:  No rashes no nodules Neuro:  CN II through XII intact, motor grossly intact  DEVICE  Normal device function.  See PaceArt for details.   Assess/Plan:

## 2010-05-21 NOTE — Assessment & Plan Note (Signed)
Her symptoms appear to be well controlled. She has minimal if any palpitations. She will continue her current medications.

## 2010-05-21 NOTE — Assessment & Plan Note (Signed)
Her device is working normally. We'll recheck in several months. 

## 2010-05-21 NOTE — Patient Instructions (Signed)
Your physician wants you to follow-up in: 12 months with Dr Court Joy will receive a reminder letter in the mail two months in advance. If you don't receive a letter, please call our office to schedule the follow-up appointment.  Your physician recommends that you continue on your current medications as directed. Please refer to the Current Medication list given to you today.  Remote monitoring is used to monitor your Pacemaker of ICD from home. This monitoring reduces the number of office visits required to check your device to one time per year. It allows [place] to keep an eye on the functioning of your device to ensure it is working properly. You are scheduled for a device check from home on 08/21/10. You may send your transmission at any time that day. If you have a wireless device, the transmission will be sent automatically. After your physician reviews your transmission, you will receive a postcard with your next transmission date.

## 2010-05-27 ENCOUNTER — Ambulatory Visit (INDEPENDENT_AMBULATORY_CARE_PROVIDER_SITE_OTHER): Payer: Medicare Other | Admitting: *Deleted

## 2010-05-27 DIAGNOSIS — I4891 Unspecified atrial fibrillation: Secondary | ICD-10-CM

## 2010-05-27 LAB — POCT INR: INR: 2.7

## 2010-06-11 ENCOUNTER — Encounter: Payer: Medicare Other | Admitting: *Deleted

## 2010-06-12 ENCOUNTER — Ambulatory Visit (INDEPENDENT_AMBULATORY_CARE_PROVIDER_SITE_OTHER): Payer: Medicare Other | Admitting: *Deleted

## 2010-06-12 DIAGNOSIS — I4891 Unspecified atrial fibrillation: Secondary | ICD-10-CM

## 2010-06-12 LAB — POCT INR
INR: 2.7
INR: 2.4

## 2010-06-13 NOTE — H&P (Signed)
NAMECHERYLIN, [name]             ACCOUNT NO.:  [id number]   MEDICAL RECORD NO.:  [id number]           PATIENT TYPE:   LOCATION:                                 FACILITY:   PHYSICIAN:  Colleen Can. Deborah Chalk, M.D.    DATE OF BIRTH:   DATE OF ADMISSION:  DATE OF DISCHARGE:                                HISTORY & PHYSICAL   DATE OF ADMISSION:  Monday, December 15, 2004   CHIEF COMPLAINT:  None.   HISTORY OF PRESENT ILLNESS:  Mrs. Burgio is a 77 year old white female. She  has had dual-chamber pacemaker implanted since 2001 due to tachybrady  syndrome. She has had paroxysmal atrial fibrillation. She was seen in  routine pacemaker clinic earlier this month and was noted to have elective  replacement indicators. She is now referred for elective generator  replacement. Clinically, she has had no complaints of dizziness. Her rhythm  has remained stable on Rythmol.   PAST MEDICAL HISTORY:  1.  Tachybrady syndrome with history of paroxysmal atrial fibrillation. She      originally had a [employer] Max DR, model 1270, serial number [id number]      implanted in May 2001.  2.  Remote history of Coumadin therapy.  3.  Mild aortic regurgitation. She had her last echocardiogram in March      2005. Ejection fraction was 60-65%.  4.  Hypertension.  5.  Chronic antiarrhythmic therapy.  6.  History of lumpectomy on both breasts in 1993 and 1996.  7.  History of tonsillectomy in 1950.   ALLERGIES:  NONSTEROIDALS.   CURRENT MEDICINES:  1.  Toprol-XL 50 mg a day.  2.  Verelan 240 a day.  3.  Multivitamin daily.  4.  Rythmol SR 325 b.i.d.  5.  Aspirin daily.  6.  Lasix 40 a day.  7.  Lunesta p.r.n.  8.  Ativan p.r.n.   FAMILY HISTORY:  Father died at age 65. Mother died at 3 and had a history  of a pacemaker as well.   SOCIAL HISTORY:  She has been widowed since September 2000. She has no  alcohol or tobacco use. She is a retired Engineer, civil (consulting).   REVIEW OF SYSTEMS:  Basically as noted above.  She has had some complaints of  neck pain which she feels is related to her antiarrhythmic therapy. She has  had some degree of shortness of breath that has been somewhat vague and  nonspecific. She has had no complaints of chest pain. She has had a past  history of anemia which has been evaluated. She has had no chest pain. She  did suffer a fall back in the early part of the fall.   PHYSICAL EXAMINATION:  GENERAL:  She is a pleasant elderly white female in  no acute distress.  VITAL SIGNS:  Blood pressure 140/70 sitting, 130/80 standing; heart rate 72;  weight is 172 pounds.  SKIN:  Warm and dry, color is unremarkable.  LUNGS:  Clear.  HEART:  Shows a regular rhythm.  ABDOMEN:  Soft, positive bowel sounds, nontender.  EXTREMITIES:  Without edema.  NEUROLOGIC:  Intact. There are no gross focal deficits.   PERTINENT LABORATORY:  Pending.   OVERALL IMPRESSION:  1.  End-of-life pulse generator.  2.  History of tachybrady syndrome with original pacemaker implantation in      2001.  3.  Paroxysmal atrial fibrillation.  4.  Chronic antiarrhythmic therapy.  5.  Hypertension.   PLAN:  Will proceed on with elective generator replacement. The procedure  has been reviewed in full detail and she is willing to proceed on Monday,  December 15, 2004.      Sharlee Blew, N.P.      Colleen Can. Deborah Chalk, M.D.  Electronically Signed    LC/MEDQ  D:  12/09/2004  T:  12/09/2004  Job:  [id number]   cc:   Brett Canales A. Cleta Alberts, M.D.  Fax: [phone]

## 2010-06-13 NOTE — Discharge Summary (Signed)
[facility]  Patient:    [name], [name]                    MRN: [id number] Adm. Date:  [id number] Disc. Date: 04/28/00 Attending:  Melody Haver CC:         Everardo All. Madilyn Fireman, M.D.             Madaline Savage, M.D.                           Discharge Summary  HISTORY OF PRESENT ILLNESS:  This is a 77 year old woman who presents with a history of GI bleeding.  The patient has in the past been evaluated for heme positive stools and melena.  She has had colonoscopy and upper esophagogastroduodenoscopy in December 2001, without any identifiable source. She presents now with a history of dark blood with bright red bleeding in her stools.  Of note, is the fact that she had her Coumadin dose increased from 4 mg to 6 mg per day, and 24 hours prior to this admission she took two aspirin tablets.  Her hemoglobin in the emergency room was 8.9.  She had been receiving her Coumadin therapy from Dr. Elsie Lincoln, and the emergency room notified his service, and they indicated that the problem was not a cardiological problem and that they would not see her for that.  The GI department was also notified, and they had a similar complaint and did not see the patient.  She was therefore left to become an unassigned patient, and I was asked to see her.  PHYSICAL EXAMINATION:  GENERAL:  A well-developed woman who appeared clinically stable and not in any distress.  LUNGS:  Clear.  ABDOMEN:  Soft, and no masses or tenderness were present.  RECTAL:  Done by the emergency room physician, and apparently this had bright red rectal bleeding.  IMPRESSION ON ADMISSION: 1. Gastrointestinal bleeding secondary to Coumadin/aspirin. 2. History of atrial fibrillation. 3. Past history of diverticulosis on colonoscopy.  HOSPITAL COURSE:  The patient was admitted to the hospital and was given vitamin K1 10 mg on the day of her admission and also the following day.   Her hemoglobin initially dropped down into the 6.6 range, and she was given 2 units of packed cells.  Her prothrombin time improved, and her hemoglobin stabilized.  She felt well.  Her diet was advanced, and her bleeding stopped. Her INR decreased to 2.7, and she had no more rectal bleeding, and her hemoglobin stabilized at 9.5.  She felt well, and was then discharged.  IMPRESSION ON DISCHARGE: 1. Gastrointestinal bleeding secondary to Coumadin/aspirin. 2. History of atrial fibrillation. 3. Past history of diverticulosis.  DISCHARGE MEDICATIONS:  She will continue taking her usual medications.  Her Coumadin will be restarted tomorrow with the dose of 4 mg daily.  FOLLOWUP:  She was advised to see Dr. Elsie Lincoln on Monday for a prothrombin time, and she was also advised to see Dr. Madilyn Fireman to determine whether he feels that any repeat colonoscopy should be performed. DD:  04/28/00 TD:  04/28/00 Job: [id number]

## 2010-06-13 NOTE — H&P (Signed)
NAMEBERDENE, ASKARI                         ACCOUNT NO.:  [id number]   MEDICAL RECORD NO.:  [id number]                    PATIENT TYPE:   LOCATION:                                       FACILITY:   PHYSICIAN:  Colleen Can. Deborah Chalk, M.D.            DATE OF BIRTH:   DATE OF ADMISSION:  DATE OF DISCHARGE:                                HISTORY & PHYSICAL   CHIEF COMPLAINT:  Palpitations and tachycardia with recurrent atrial  fibrillation.   HISTORY OF PRESENT ILLNESS:  Ms. Ayuso is a 77 year old female who has had  recurrent atrial fibrillation.  She was evaluated for palpitations and  tachycardia towards the later part of February.  She tried to increase her  Toprol with really no significant relief.  She has not really felt more  short of breath or having chest pain, but she has had significant fatigue.  She has been maintained on chronic Coumadin therapy, and now presents with  satisfactory INR's and now presents for elective cardioversion.   PAST MEDICAL HISTORY:  1. Palpitations.  2. History of paroxysmal atrial fibrillation.  3. Coumadin therapy.  4. Previous history of pacemaker implantation with a Guidant Pulsar DDD unit     implanted in May of 2001.  5. Valvular disease.  6. Hypertension.  7. Previous lumpectomy of both breasts in 1993 and 1996.  8. History of tonsillectomy in 1950.   ALLERGIES:  ANSAID.   CURRENT MEDICATIONS:  1. Coumadin 2.5 mg a day.  2. Toprol XL 50 mg a day.  3. Verelan 200 mg a day.  4. Hydrochlorothiazide 25 mg a day.  5. Ativan at bedtime.  6. Multivitamin daily.  7. Benadryl at bedtime.  8. Lanoxin 0.25 daily.   FAMILY HISTORY:  Father died at 79, mother died at 64 and had a history of a  pacemaker as well.   She has two sisters who also have indwelling pacemakers.   SOCIAL HISTORY:  She has been widowed since September of 2000. She has no  tobacco use.  She is a retired Engineer, civil (consulting).   REVIEW OF SYSTEMS:  As noted above, otherwise  unremarkable.   PHYSICAL EXAMINATION:  VITAL SIGNS:  Weight 193 pounds, blood pressure  120/80 sitting and standing.  Heart rate 92 and irregularly irregular.  Respirations are 18.  She is afebrile.  SKIN:  Warm and dry.  Color is unremarkable.  HEENT:  Unremarkable.  LUNGS:  Basically clear.  HEART:  Irregularly irregular rhythm.  ABDOMEN:  Obese.  She has soft, positive bowel sounds, nontender.  EXTREMITIES:  Without edema.  NEUROLOGIC:  Intact.  No gross focal deficits.   LABORATORY DATA:  Pertinent labs are pending.   IMPRESSION:  1. Atrial fibrillation.  2. Chronic Coumadin therapy.  3. Functioning dual chamber pacemaker.  4. Hypertension.   PLAN:  We will proceed on with electrocardioversion.  The procedure has been  reviewed in full detail, and  she is willing to proceed on April 24, 2003.      Sharlee Blew, N.P.                     Colleen Can. Deborah Chalk, M.D.    LC/MEDQ  D:  04/13/2003  T:  04/15/2003  Job:  [id number]

## 2010-06-13 NOTE — H&P (Signed)
NAME:  [name], [name]                       ACCOUNT NO.:  [id number]   MEDICAL RECORD NO.:  [id number]                   PATIENT TYPE:  OIB   LOCATION:                                       FACILITY:  [facility]   PHYSICIAN:  Colleen Can. Deborah Chalk, M.D.            DATE OF BIRTH:  01-06-1924   DATE OF ADMISSION:  06/01/2003  DATE OF DISCHARGE:                                HISTORY & PHYSICAL   CHIEF COMPLAINT:  Palpitations and fatigue.   HISTORY OF PRESENT ILLNESS:  [name] is a 77 year old female who has had  recurrent atrial fibrillation. She had cardioversion performed on March 29,  which was short lived. She has now subsequently been placed on Rhythmol SR  225 mg b.i.d. and has tolerated that very well without problems. She has  been maintained on chronic Coumadin anticoagulation and now she presents for  repeat attempts at cardioversion.   Clinically she continues to feel weak and washed out. She does continue to  noted palpitations.   PAST MEDICAL HISTORY:  1. Palpitations.  2. History of atrial fibrillation.  3. Coumadin therapy.  4. Functioning dual chamber pacemaker with a Guidant pulsar DDD unit     implanted in May 2001.  5. Known valvular disease with 2-D echocardiogram in March 2005 showing and     ejection fraction 60-65%. Mild LVH, moderate biatrial enlargement,     prominent septal knuckle, which extends into the outflow area, moderate     aortic valve sclerosis with moderate AI, mild MR, mild TR, normal LV     systolic function.  6. Hypertension.  7. Previous lumpectomy of both breasts in 1993 and 1996.  8. History of tonsillectomy in 1950.   ALLERGIES:  NSAID.   CURRENT MEDICATIONS:  1. Coumadin 2.5 mg a day.  2. Toprol XL 50 mg a day.  3. Verelan 200 mg a day.  4. Hydrochlorothiazide 25 mg one each day except an extra half a tab every     third day.  5. Ativan at bedtime.  6. Multivitamin daily.  7. Lanoxin 0.125 daily.  8. Rhythmol SR 225  b.i.d.   FAMILY HISTORY:  Unchanged.   SOCIAL HISTORY:  Unchanged.   REVIEW OF SYSTEMS:  As noted above and is otherwise unremarkable.   PHYSICAL EXAMINATION:  GENERAL:  She is an elderly white female in no acute  distress.  VITAL SIGNS: Blood pressure is 120/70 sitting, 100/70 standing. Heart rate  88 and irregular, respirations 18, weight 185 pounds.  SKIN:  Warm and dry. Color is unremarkable.  LUNGS:  Basically clear.  HEART:  An irregular irregular rhythm.  ABDOMEN:  Soft, positive bowel sounds. Nontender.  EXTREMITIES:  Without edema.   PERTINENT LABS:  Pending.   IMPRESSION:  1. Recurrent atrial fibrillation.  2. Coumadin therapy.  3. Recent initiation of anti-arrhythmic therapy.  4. Functioning dual chamber pacemaker.  5. Hypertension.   PLAN:  We will proceed on with attempts at repeat cardioversion. The  procedure has been reviewed in full detail and she is willing to proceed on  Friday, Jun 01, 2003.      Sharlee Blew, N.P.                     Colleen Can. Deborah Chalk, M.D.    LC/MEDQ  D:  05/28/2003  T:  05/28/2003  Job:  [id number]

## 2010-06-13 NOTE — Discharge Summary (Signed)
[name], [name]             ACCOUNT NO.:  [id number]   MEDICAL RECORD NO.:  [id number]          PATIENT TYPE:  INP   LOCATION:  3707                         FACILITY:  [facility]   PHYSICIAN:  Leighton Roach McDiarmid, M.D.DATE OF BIRTH:  [birth year]   DATE OF ADMISSION:  02/18/2006  DATE OF DISCHARGE:  02/19/2006                               DISCHARGE SUMMARY   DISCHARGE DIAGNOSES:  1. Hypoxia, resolved.  2. Wheezing.  3. Viral upper respiratory infection.  4. Bronchospasm, resolved.  5. Atrial fibrillation, rate controlled.  6. Hypertension.  7. Insomnia.   CONSULTATIONS:  Dr. Deborah Chalk with [facility].   PROCEDURES:  1. Chest x-ray on February 18, 2006, showed __________  with right mid      lung linear atelectasis, otherwise no change and no active disease.  2. Repeat chest x-ray on February 19, 2006, showed persistent streaky      atelectasis in the right mid lung with no other changes.  3. EKG showed no acute changes per cardiology.   DISCHARGE MEDICATIONS:  1. Toprol XL 50 mg p.o. daily.  2. Verelan 240 mg p.o. daily.  3. Rythmol SR 325 mg p.o. b.i.d.  4. Aspirin 325 mg 2 tablets p.o. daily.  5. Lasix 40 mg p.o. daily.  6. Iron 325 mg p.o. twice daily.  7. Multivitamin p.o. daily (the patient takes two daily).  8. Lorazepam 30 mg p.o. q.h.s. p.r.n.  9. Albuterol MDI with spacer 90 mcg per spray 2 puffs inhaled every 4      hours as needed for shortness of breath.  Prescription provided.  10.The patient may continue to take guaifenesin 1200 mg p.o. b.i.d.      for cough which she may buy over-the-counter.   BRIEF HOSPITAL COURSE:  Please see full dictated history and physical  for full details of admission presentation and initial workup.  In  brief, this is an 77 year old white female who was admitted for hypoxia  and wheezing and chest tightness.   #1 - CHEST TIGHTNESS:  Remained resolved after receiving breathing  treatment in the ED.  The patient was ruled  out for MI with cardiac  enzymes which were negative x3 and EKG which showed no acute changes.  The patient's cardiologists were initially consulted in the ED prior to  acceptance of the patient and felt that her chest tightness was likely  secondary to bronchospasm, had no concern for an acute cardiac process  at this time.  The patient's Toprol XL was held, on first day of  admission, given her wheezing and shortness of breath.  She was  continued on her aspirin at home dose.  The patient's Toprol XL was  resumed prior to discharge.   #2 - HYPOXIA AND WHEEZING:  Improved with albuterol and Atrovent nebs  which were continued throughout the first 24 hours of admission.  The  patient's Atrovent was discontinued and the patient was transitioned to  albuterol MDI with spacer prior to discharge.  The patient's clinical  exam and two chest x-rays were negative for pneumonia, and the patient  remained afebrile with a normal white  blood cell count throughout  admission.  The patient was without O2 requirement for 24 hours prior to  discharge.   #2 - ATRIAL FIBRILLATION:  Is rate controlled on the patient's home  medications of Toprol XL, Verelan and Rythmol.  The patient is not on  Coumadin given history of GI bleed.  She is on aspirin 650 mg p.o. daily  per her cardiology.  Cardiology followed the patient throughout  admission, and the patient's atrial fibrillation remained stable and  asymptomatic.   #3 - HYPERTENSION:  Remained well controlled on the patient's home  medications.   #4 - INSOMNIA:  The patient was given Ativan 1 mg p.o. q.h.s. to prevent  withdrawal.  However, decreased dose was given as compared to home dose  to minimize respiratory suppression.   PERTINENT LABORATORY DATA:  Basic metabolic panel on the day of  discharge showed sodium of 140, potassium of 4.0, chloride of 103,  bicarbonate of 28, BUN of 80 and creatinine of 1.02 and glucose of 98.  CBC on admission  showed a white blood cell count of 5.0, hemoglobin of  15.2, hematocrit 44.0, platelets of 227 with no left shift.   FOLLOW-UP APPOINTMENTS:  The patient has follow-up appointment with Dr.  Cleta Alberts at [facility] on January 29 at 10:20 a.m..   The patient was discharged home in stable condition.     ______________________________  Drue Dun, M.D.    ______________________________  Leighton Roach McDiarmid, M.D.    EE/MEDQ  D:  02/23/2006  T:  02/24/2006  Job:  [id number]

## 2010-06-13 NOTE — H&P (Signed)
NAMEDEVONNA, OBOYLE             ACCOUNT NO.:  [id number]   MEDICAL RECORD NO.:  [id number]          PATIENT TYPE:  INP   LOCATION:  3707                         FACILITY:  [facility]   PHYSICIAN:  Colleen Can. Deborah Chalk, M.D.DATE OF BIRTH:  March 13, 1923   DATE OF ADMISSION:  02/18/2006  DATE OF DISCHARGE:  02/19/2006                              HISTORY & PHYSICAL   DATE OF ADMISSION:  Planned for April 02, 2006.   CHIEF COMPLAINT:  Fatigue.   HISTORY OF PRESENT ILLNESS:  [name] is a very pleasant 77 year old  white female who has multiple medical problems. She has underlying  pacemaker in place due to past history of tachybrady syndrome. She has  had in the past paroxysmal atrial fibrillation, but has been in atrial  fibrillation following a bout of bronchitis since January. In the past  she has been very reluctant to be on Coumadin, but she has agreed and  has now been therapeutic over the last 3-4 weeks and now presents for  elective cardioversion.  She has been maintained on chronic  antiarrhythmic therapy that consists of Rythmol SR 425 mg b.i.d.  Clinically she complains of fatigue. Previously she had had complaints  of palpitations, but her pacemaker was reprogrammed to DDIR mode.   PAST MEDICAL HISTORY:  1. Tachybrady syndrome with history of paroxysmal atrial fibrillation.      She originally had a Guidant pulsar generator, but subsequently had      generator replacement with a Medtronic unit in November 2006.  2. Coumadin therapy.  3. History of mild aortic regurgitation.  4. Hypertension.  5. Chronic antiarrhythmic therapy.  6. Previous history of lumpectomies on both breasts in 1993 and 1996.  7. History of tonsillectomy.  8. Mild left ventricular hypertrophy.   ALLERGIES/INTOLERANCES:  None. Intolerances are NONSTEROIDALS.   CURRENT MEDICATIONS:  1. Toprol XL 50 mg a day.  2. Verelan 240 a day.  3. Multivitamin daily.  4. Rythmol SR 425 b.i.d.  5. Lasix 40 a day.  6. Ativan p.r.n.  7. Melatonin p.r.n.  8. Coumadin currently taking 5 mg alternating with 2.5.   FAMILY HISTORY:  Father died at age 84. Mother died at 42. Had a history  of a pacemaker implant as well.   SOCIAL HISTORY:  She has been a widower over the past eight years. She  has no alcohol or tobacco products. She is a retired Engineer, civil (consulting).   REVIEW OF SYSTEMS:  Is as noted above, and is otherwise, unremarkable.   PHYSICAL EXAMINATION:  GENERAL:  She is a pleasant elderly white female  in no acute distress.  VITAL SIGNS:  Blood pressure 140/80 sitting, 130/80 standing, heart rate  76 and irregular, weight 192 pounds.  SKIN:  Warm, dry. Color is unremarkable.  LUNGS:  Clear.  HEART:  She has an irregular rhythm.  ABDOMEN:  Obese.  EXTREMITIES:  She has no peripheral edema.  NEUROLOGIC:  Shows no gross focal deficits.   PERTINENT LABORATORIES:  Pending.   OVERALL IMPRESSION:  1. Atrial fibrillation.  2. Coumadin therapy.  3. Antiarrhythmic therapy.  4. Underlying pacemaker.   PLAN:  Will proceed on with attempts at outpatient cardioversion. The  procedure has been reviewed in full detail, and she is willing to  proceed on Friday, April 02, 2006.      Sharlee Blew, N.P.      Colleen Can. Deborah Chalk, M.D.  Electronically Signed    LC/MEDQ  D:  03/26/2006  T:  03/26/2006  Job:  [id number]   cc:   Brett Canales A. Cleta Alberts, M.D.

## 2010-06-13 NOTE — Discharge Summary (Signed)
[facility]  Patient:    [name], [name]                    MRN: [id number] Adm. Date:  [id number] Disc. Date: [id number] Attending:  Eleanora Neighbor Dictator:   Jennet Maduro. Earl Gala, R.N., A.N.P. CC:         Madaline Savage, M.D.             Colleen Can. Deborah Chalk, M.D.                           Discharge Summary  DISCHARGE DIAGNOSES: 1. 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 2. Fatigue, felt to be secondary to atrial fibrillation. 3. Chronic mild aortic regurgitation, currently stable. 4. History of hypertension.  HISTORY OF PRESENT ILLNESS:  [name] is a 77 year old white female who has a long-standing history of tachycardic-bradycardic syndrome with intermittent atrial fibrillation.  She is maintained on chronic Coumadin therapy.  She presents for insertion of a permanent transvenous pacemaker in order for antiarrhythmic therapy to be initiated at a later date.  Please see the dictated history and physical for further patient presentation and profile.  LABORATORY DATA:  INR on admission was 1.6.  Chemistries were satisfactory with sodium of 144, potassium 4.0, chloride 104, CO2 29, BUN 21, creatinine 1.0, and glucose 103.  CBC shows hemoglobin 13, hematocrit 42, white count 4.9, and platelets are 269.  HOSPITAL COURSE:  The patient was admitted to the short-stay center in order to undergo permanent transvenous pacemaker implantation.  This procedure was tolerated well without any known complications.  A Guidant Pulsar DDD unit was implanted.  Satisfactory thresholds were obtained, and the patient was subsequently transferred to 6500 for further monitoring and evaluation.  Today, on Jun 12, 1999, the patient continues to do well.  She is currently in a paced rhythm.  The pacemaker site is unremarkable.  Plans are now made for her to  be discharged today, with her follow-up on an outpatient basis with Dr. Lavonne Chick.  CONDITION ON DISCHARGE:  Stable.  DISCHARGE MEDICATIONS:  She may resume all of her home medicines as before coming to the hospital.  She is also given the okay to resume her Coumadin today at the same dosing schedule.  DISCHARGE INSTRUCTIONS:  Extensive written instructions are provided regarding pacemaker care; specifically, she is not to raise the right arm above her head for the next two to three weeks, and she is not to get the wound wet for the next week.  She was given Betadine swabs prior to leaving the hospital in order to swab the pacemaker site daily.  FOLLOW-UP:  Follow-up appointment will need to be with Dr. Lavonne Chick in approximately 7-10 days.  She is asked to call to schedule.  She will see Dr. Deborah Chalk on a p.r.n. basis. DD:  06/12/99 TD:  06/13/99 Job: [id number]

## 2010-06-13 NOTE — H&P (Signed)
[name], [name]                       ACCOUNT NO.:  [id number]   MEDICAL RECORD NO.:  [id number]                   PATIENT TYPE:  INP   LOCATION:  5156                                 FACILITY:  [facility]   PHYSICIAN:  Santiago Bumpers. Hensel, M.D.             DATE OF BIRTH:  [birth year]   DATE OF ADMISSION:  06/18/2003  DATE OF DISCHARGE:                                HISTORY & PHYSICAL   CHIEF COMPLAINT:  Fatigue.   HISTORY OF PRESENT ILLNESS:  [name] is a 77 year old female who is  referred from [facility] for an undiagnosed febrile illness.  She  reports 3 days of fever up to 102.8 with associated chills and myalgias.  At  [facility] the patient had a temperature of 100, demonstrated orthostatic  blood pressures, and complained of left lower quadrant tenderness on  examination.  Preliminary lab work there showed a white blood cell count of  28,000.  The patient states that her sister, with whom she has had travel,  had developed similar symptoms of fatigue that had resolved without fever.  She denies cough, sore throat, rhinitis, headache, nausea, vomiting or  diarrhea.  Her last bowel movement was 3 days prior to admission.  She  denies dysuria, urinary frequency, vaginal discharge, vaginal bleeding, and  abdominal pain.  She went on a cruise to [place] with her sister that  departed Jun 02, 2003 for one week.  She denies any exotic exposures during  this time.  Of note, she was cardioverted for a tachybrady syndrome by Dr.  Deborah Chalk on Jun 01, 2003.   PAST MEDICAL HISTORY:  1. Recurrent atrial fibrillation, tachybrady syndrome, cardioverted by Dr.     Deborah Chalk Jun 01, 2003.  2. Dual chamber pacemaker implanted May, 2001.  3. Hypertension.  4. Bilateral breast lumpectomies in 1993 and 1996.  5. Status post tonsillectomy.  6. Colonoscopy in October of 2002, status post polypectomy and was positive     for diverticulosis.  7. In March, 2005 2D echocardiogram  revealed an ejection fraction of 60 to     65%, mild LVH, prominent septal nuchal extending to the outflow area.     Moderate aortic valve sclerosis with moderate aortic insufficiency.  Mild     mitral valve regurgitation, mild tricuspid valve regurgitation, normal LV     systolic function.   REVIEW OF SYSTEMS:  Indication for colonoscopy in 2002 was bright red blood  per rectum.  The patient was anticoagulated at that time and a polyp was  identified, patient is status post polypectomy which showed benign  histology.  She is not currently sexually active, denies current vaginal  bleeding or bright red blood per rectum, no melena  No chest pain, no  shortness of breath.   MEDICATIONS:  1. Coumadin 2.5 mg daily.  2. Rhythmol 225 mg b.i.d.  3. Verelan 240 mg daily.  4. Hydrochlorothiazide 25 mg daily.  5. Toprol XL 50 mg daily.  6. Lanoxin 0.125 mg daily.  7. Lorazepam q.h.s.   ALLERGIES:  No known drug allergies, however the patient refused NSAIDs  secondary to associated edema.   FAMILY HISTORY:  Significant for father deceased at 45.  Mother deceased at  65, she had a pacemaker.  Cardiac disease is unknown.  The patient also has  2 sisters with pacemakers, unclear cardiac disease.  There is a family  history of colon cancer in the patient's sister.   SOCIAL HISTORY:  The patient has been widowed since September of 2000, she  is a retired Engineer, civil (consulting). No tobacco.  She lives alone.  She has a sister near by.  She is completely independent.   PHYSICAL EXAMINATION:  GENERAL:  The patient is resting comfortably and is  animated.  VITAL SIGNS:  Temperature 97.6, heart rate 78, respiratory rate 18, blood  pressure 123/67, O2 saturation 93% on room air.  At [facility]  orthostatic blood pressure was checked, supine blood pressure 138/76, heart  rate 72.  Sitting blood pressure 130/70, heart rate 72.  Standing blood  pressure 110/70, heart rate 72.  Temperature [facility] 99.8 [facility].  HEENT:  Normocephalic, atraumatic.  Mucosal membranes are pink and tacky.  Oropharynx is clear, no jaundice.  NECK:  No thyromegaly, no masses, no palpable lymphadenopathy.  CARDIOVASCULAR:  Normal precordium S1, S2, regular rate and rhythm, 2 out of  6 systolic murmur across precordium, no radiation to neck or axilla, 2 out  ot 2 radial and dorsalis pedis pulses.  LUNGS:  Clear to auscultation bilaterally without crackle, wheeze or  rhonchi.  Air exchange is good.  BACK:  No scoliosis or kyphosis.  No CVA tenderness.  ABDOMEN:  Nondistended, normal bowel sounds and soft, no hepatosplenomegaly,  o masses.  Palpable stool on the left side of colon, nontender.  RECTAL:  Revealed external skin tags, no active hemorrhoids.  Large soft  stool in rectal vault, guaiac negative.  EXTREMITIES:  No erythema, edema or callous.  SKIN:  No rash. Two biopsy sites recently performed on the left upper  extremity for possible basal cell carcinoma.  Sites are clean, dry and  intact.  NEURO:  Mentation is intact.  Cranial nerves II-XII intact. Strength 5 out  of 5.  Sensation intact to light touch.   LABORATORY DATA:  From [facility]:  A clean catch urine specific  gravity 1.025, ketones 15, small bilirubin, 100 protein; urine microscopic:  2 to 5 rbc's, 0 to 3 white blood cell count, 0 to 2 epithelial cells, 1+  bacteria.  White count 2800, hemoglobin 13.8, platelets 241,000, ANC 0.9.  Labs in the [facility] emergency department:  White count 2.7, hemoglobin  13.5, hematocrit 40.5, platelets 225,000, ANC 1.4.  Sodium 137, potassium  3.4, chloride 100, bicarb 29, BUN 18, creatinine 1.2, glucose 102, calcium  8.5, total bilirubin 1, alkaline phosphatase 75, AST 54, ALT 46, total  protein 6.5, albumin 3.4, lipase 27, PT 15.8, INR 1.4, PTT 39.   Acute abdominal series obtained at [facility]:  Chest x-ray - no acute disease. Abdomen is full of stool.  No free air or fluid (this is  an  unofficial read).  CT of the abdomen is pending.  Blood cultures are  pending.   ASSESSMENT AND PLAN:  This is a 77 year old female with a history of  diverticulosis and atrial fibrillation, and status post cardioversion who  presents with a  fever of questionable etiology, is currently stable.   1. Fever.  Only reported afebrile in the primary physician's office.  Vague     symptoms of fatigue and myalgias but low white blood cell count     suggestive of viral syndrome.  Primary care physician felt there was left     lower quadrant tenderness on examination although the patient does not     complain of pain.  I was unable to replicate these symptoms, however,     with patient's history of diverticulosis there is concern for     diverticulitis although this is less likely although this is less likely,     will check an abdominal CT.  Urine is not strongly positive and was a     clean catch specimen.  The patient is asymptomatic, therefore will not     proceed further.  We will check blood cultures.  Lung exam and chest x-     ray are negative.  The patient is constipated currently and differential     diagnoses are viral syndrome, constipation is also a consideration.  Will     follow CBC.   1. Constipation.  Dulcolax.   1. Orthostatic hypotension.  Normal saline bolus then maintenance IV fluids,     encourage oral fluid intake.  Will follow blood pressure.   1. Atrial fibrillation status post cardioversion.  Subtherapeutic INR, will     increase Warfarin to 5 mg tonight and then will follow INR, continue 2.5     mg of Warfarin daily.  Currently in regular rate and rhythm.  Continue Toprol and Rhythmol.   1. Hypertension - is currently well controlled.  May see increase in her     blood pressure after patient's volume status is repleted.  Will continue     Toprol, Verapamil and hydrochlorothiazide.   1. Marginally increased liver function tests.  No clinical etiology, will      follow.      Barney Drain, M.D.                       William A. Leveda Anna, M.D.    SS/MEDQ  D:  06/18/2003  T:  06/19/2003  Job:  [id number]   cc:   Denzil Magnuson  [address]  [place]  [place] [address]  Fax: [phone]   Colleen Can. Deborah Chalk, M.D.  Fax: [phone]   Everardo All. Madilyn Fireman, M.D.  [address], [address]  [place]  [place] [address]  Fax: [phone]

## 2010-06-13 NOTE — H&P (Signed)
NAMEAVRIANA, [name]             ACCOUNT NO.:  [id number]   MEDICAL RECORD NO.:  [id number]          PATIENT TYPE:  INP   LOCATION:  3707                         FACILITY:  [facility]   PHYSICIAN:  Leighton Roach McDiarmid, M.D.DATE OF BIRTH:  1923/05/02   DATE OF ADMISSION:  02/18/2006  DATE OF DISCHARGE:                              HISTORY & PHYSICAL   PRIMARY CARE PHYSICIAN:  Dr. Cleta Alberts at [facility].   CARDIOLOGIST:  Colleen Can. Deborah Chalk, M.D. at [facility].   CHIEF COMPLAINT:  Cold and wheezing.   HISTORY OF PRESENT ILLNESS:  This is an 77 year old white female who  presents with a two day history of wheezing that has worsened over time.  The patient also has started with a cough yesterday but has recently  dried, only occasionally productive of sputum.  The cough kept her awake  all last night.  With her wheezing worsening, she first went to [facility] this morning and was sent to the ED for admission to rule  out MI given an episode of chest tightness this afternoon as well as  rule out possible pneumonia.  The patient denies any fever or chills but  does endorse increased fatigue over the last couple of days.  She denies  any history of asthma or COPD or tobacco abuse.  Patient denies prior  history of wheezing as well.  Patient has not had any specific sick  contacts but does visit her sister in the nursing home frequently.  Patient's chest tightness has resolved since this afternoon and is no  longer pertinent at the time of exam.   PAST MEDICAL HISTORY:  1. Atrial fibrillation.  Patient has pacemaker for a sick sinus      syndrome.  It is rate controlled but is no longer on Coumadin      secondary to GI bleed.  2. Hypertension.  3. Venous insufficiency.  4. Status post blood transfusion or GI bleed while on Coumadin.  5. History of iron deficiency anemia status post IV iron therapy.   PAST SURGICAL HISTORY:  1. Status post cholecystectomy.  2.  Status post tonsillectomy and adenoidectomy years ago.   MEDICATIONS:  1. Toprol XL 50 mg p.o. daily.  2. Verapamil 240 mg p.o. daily.  3. Rythmol SR 325 mg p.o. b.i.d.  4. Aspirin 650 mg p.o. daily.  5. Lasix 40 mg p.o. daily.  6. Multivitamin 2 p.o. daily.  7. Lorazepam 3 mg p.o. q.h.s.   ALLERGIES:  NSAIDS CAUSE SWELLING EXCEPT FOR ASPIRIN.   FAMILY HISTORY:  Patient's mother had atrial fibrillation with a  pacemaker and died at age of 73 from a hemorrhagic stroke.  Patient's  father had hypertension and mild stroke and died of pneumonia at age 79  years old.  Patient's brother died of metastatic prostate cancer.  Patient's older sister is living at 7 years old and has a pacemaker for  a slow heart rate.  Patient's younger sister is living at 80 years old  and has recently suffered from a stroke.   SOCIAL HISTORY:  Patient is very tired Press photographer.  Patient currently  cares for her daughter who is status post rectal carcinoma surgery.  Patient lives alone approximately one mile from her daughter's house.  Patient denies tobacco use.  Drinks 2-3 ounces of whisky 2-3 times per  week.  Denies recreational drug use.   REVIEW OF SYSTEMS:  GENERAL:  Patient denies any fever or chills.  Does  endorse increased fatigue as per HPI.  Patient reports chronically  decreased appetite.  Denies nausea, vomiting or diarrhea.  Reports  occasionally bright red blood per rectum related to hemorrhoids but no  chronic bloody stools.  Initially did have chest tightness with exertion  this afternoon which has now resolved.  Patient reports positive stress  incontinence.  Denies dysuria.   PHYSICAL EXAMINATION:  Vital signs:  Temperature 97.8.  Heart rate 65.  Respiratory rate 22.  Blood pressure 112/45.  Oxygen saturation  initially at 96% on room air, decreased to 88% on 2 liters while in the  room.  GENERAL:  The patient is alert, oriented, in no acute distress.  HEENT:  Shows head is  normocephalic, atraumatic.  Pupils equal, round  and reactive to light.  Extraocular movements intact.  Conjunctiva and  lids are clear bilaterally.  Nares are without discharge.  Oropharynx is  without erythema and exudates.  CARDIOVASCULAR:  Heart rhythm is irregularly irregular but rate  controlled and patient has 2+ dorsalis pedis pulses bilaterally.  LUNGS:  Have diffuse inspiratory and end expiratory wheezes with  adequate air movement and normal work of breathing.  ABDOMINAL:  Normal active bowel sounds, soft, nontender and  nondistended.  EXTREMITIES:  Warm and well perfuse.  No clubbing, cyanosis or edema.  SKIN:  Has good turgor, is warm and dry.  NEURO:  Cranial nerves II-XII grossly intact. Patient has 5/5 strength  bilaterally in all four extremities.   LABORATORY DATA:  CBC shows a normal white blood cell count of 5.0,  hemoglobin of 15.2, hematocrit of 44.0, platelets 227, 66% neutrophils,  20% lymphocytes and absolute neutrophil count of 3.3.  Coag's show PT of  13.4, INR of 1.0, PTT of 24.  Initial set of cardiac enzymes show a CK  of 60, CKMB of 0.9, an invalid relative index and troponin of 0.02.  BNP  is 332.0.   Chest x-ray:  Shows mild linear atelectasis in the right middle lobe but  no infiltrate and no CHS.   EKG:  Shows no acute changes per cardiology.   ASSESSMENT/PLAN:  This is an 77 year old white female with hypoxia,  wheezing and chest tightness.  1. Chest tightness:  This is likely secondary to bronchospasm however      will rule out MI with cardiac enzymes x3 and repeat EKG in a.m.      Patient 's first set of cardiac enzymes were negative and initial      EKG showed no acute changes per cardiology.  Thus cardiology feels      this is related to pulmonary issues however they will continue to      follow the patient as she is well known to them.  We will place the     patient on telemetry and continue her home dose of aspirin 650 mg      p.o. daily.   We will hold her beta blocker for now given her      wheezing and shortness of breath.  We will not initiate      nitroglycerin at this time but  rather  order for call __________      for chest pain.  2. Hypoxia and wheezing:  Likely secondary to mild bronchitis given      negative chest x-ray, normal white blood cell count and afebrile.      We will hold on antibiotics for now and repeat chest x-ray in the      morning.  We will obtain blood cultures and start antibiotics if      patient spikes a temperature.  Patient has experienced relief with      nebulizer treatments in the ED and therefore will continue      albuterol 2.5 mg nebs every 4 hours q.2 hours p.r.n. as well as      Atrovent 0.5 mg nebs every 6 hours.  We will give O2 to maintain      sats that are greater than or equal to 90%.  We will check q.4 hour      vitals while on nebulizer treatments.  I am holding beta blocker      given atrial fibrillation.  We will recheck CBC in the a.m.  3. Atrial fibrillation.  Patient's rate is controlled and has      pacemaker for sick sinus syndrome.  Cardiology is following.  We      will continue patient's home dose Verapamil and Rythmol for rate      control however we will hold beta blocker as above given wheezing.      We will continue patient's home dose of aspirin 650 mg p.o. daily.      Patient is on Coumadin given history of GI bleed.  Will place on      telemetry with q.4 hour vitals as stated above.  4. Hypertension.  Currently well controlled on home medications.  Will      continue these as stated above.  5. FENGI:  We will give 250 mL IV fluid bolus and then give      maintenance IV fluids of 75 mL per hour.  We will check BNP.  We      will place patient on a heart healthy diet.  6. Insomnia.  We will give patient Ativan 1 mg q.h.s. which is less      than her 3 mg home dose to prevent withdrawal and given the fact      that the patient is wheezing.  7. Disposition:  With  pending improvement, respiratory symptoms and      rule out for myocardial infarction.     ______________________________  Drue Dun, M.D.    ______________________________  Leighton Roach McDiarmid, M.D.    EE/MEDQ  D:  02/18/2006  T:  02/19/2006  Job:  [id number]

## 2010-06-13 NOTE — Consult Note (Signed)
NAMEVICKI, CHAFFIN NO.:  [id number]   MEDICAL RECORD NO.:  [id number]          PATIENT TYPE:  EMS   LOCATION:  MAJO                         FACILITY:  [facility]   PHYSICIAN:  Peter M. Swazil[place], M.D.  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  The patient states she was fine until 2 days ago when she  developed wheezing.  This progressed until last nigh.  She was unable to  sleep and she was up coughing all night.  Her cough has been  nonproductive.  She denies any fever or chills.  She has had no chest  pain, but has had chest tightness associated with this.  She does have a  history of atrial fibrillation and has been on chronic therapy with  Rythmol therapy.  She states she goes out of rhythm one or two times per  week and is currently in atrial fibrillation with a controlled  ventricular response.  The patient states she is currently unaware of  her atrial fibrillation.  She has no known history of coronary disease,  but, to my knowledge, has not had an ischemic evaluation in the past.   PAST MEDICAL HISTORY:  1. Atrial fibrillation.  2. Sick sinus syndrome status post pacemaker implant initially in      2001.  This was revived in November of 2006.  3. Hypertension.  4. Status post bilateral breast lumpectomies in 1993 and 1996.  5. Status post cholecystectomy.  6. Status post polypectomy.  7. Status post T&A.   CURRENT MEDICATIONS:  1. Toprol XL 50 mg per day.  2. Verelan 240 mg per day.  3. Multivitamin daily.  4. Rythmol SR 325 mg b.i.d.  5. Aspirin 2 tablets daily.  6. Lasix 40 mg per day.  7. Melatonin daily.  8. Iron supplement 325 mg 2 tablets daily.   ALLERGIES:  The patient reports intolerance  nonsteroidal anti-  inflammatory drugs which cause swelling.   SOCIAL HISTORY:  The patient is widowed.  She is a retired Engineer, civil (consulting).  She  has a daughter and 2 sisters who live close by.  She lives alone.   FAMILY HISTORY:  Father died at age 43.  Mother died at age 81 and also  had a pacemaker.  Two sisters are alive and well.   REVIEW OF SYSTEMS:  Otherwise unremarkable.   PHYSICAL EXAMINATION:  GENERAL:  The patient is an elderly white female  in mild to moderate respiratory distress.  VITAL SIGNS:  Blood pressure is 112/45, pulse 85, in atrial  fibrillation.  Temperature is 97.8, saturations are 97% on room air.  HEENT:  Unremarkable.  She has no jugular venous distension or bruits.  LUNGS:  Diffuse inspiratory and expiratory wheezes.  CARDIAC:  Irregular rate and rhythm without murmur, rub or gallop.  ABDOMEN:  Soft, nontender, obese.  EXTREMITIES:  Without edema.  Pulses are 2+ and symmetric.  NEUROLOGIC:  Exam is intact.   LABORATORY DATA:  Her ECG shows atrial fibrillation with diffuse  nonspecific ST-T wave changes.  These are old.  Chest x-ray showed no  acute changes.  Her white count was 5000, hemoglobin 15.2, hematocrit  44, platelets 227,000.  Chemistries are pending.  CPK-MB and troponin  are negative.  Coags were normal.   IMPRESSION:  1. Acute bronchospasm probably due to acute bronchitis.  2. Atrial fibrillation, recurrent.  Rate is controlled.  3. Sick sinus syndrome status post DDD pacemaker placement.  4. Hypertension.   PLAN:  The patient will be admitted to the medical service for  antibiotics and nebulizer therapy.  I would hold her Toprol, given her  active bronchospasm.  It may be worthwhile getting serial cardiac  enzymes, but I doubt that she has acute ischemic insult, and her ECG  changes appear to be old.  We will follow with you.           ______________________________  Peter M. [place], M.D.     PMJ/MEDQ  D:  02/18/2006  T:  02/18/2006  Job:   [id number]   cc:   Colleen Can. Deborah Chalk, M.D.  [place] Cleta Alberts, M.D.

## 2010-06-13 NOTE — Op Note (Signed)
[facility]  Patient:    [name], [name]                      MRN: [id number] Attending:  Everardo All. Madilyn Fireman, M.D. CC:         Brett Canales A. Cleta Alberts, M.D.   Operative Report  PROCEDURE:  Esophagogastroduodenoscopy.  INDICATIONS FOR PROCEDURE:  Dark stools documented to be heme-positive in a patient who has recently had a colonoscopy and is on Coumadin.  DESCRIPTION OF PROCEDURE:  The patient was placed in the left lateral decubitus position and placed on the pulse monitor with continuous low-flow oxygen delivered by nasal cannula.  She was sedated with 75 mcg IV Fentanyl and 3.5 mg IV Versed.  The Olympus video endoscope was advanced under direct vision into the oropharynx and esophagus.  The esophagus was straight and of normal caliber with the squamocolumnar line at 38 cm.  There was no hiatal hernia, rings, stricture, or other abnormality at the GE junction.  The stomach was entered, and a small amount of liquid secretions were suctioned to the fundus.  Retroflex view of the cardia confirmed a 1-cm submucosal nodule with apparently normal overlying mucosa near the cardia.  This was not biopsied based on the submucosal nature and her being on Coumadin.  The remainder of the fundus body and antrum appeared normal.  The duodenum was entered and both the bulb and second portion were well inspected and appeared to be within normal limits.  The endoscope was then withdrawn, and the patient was returned to the recovery room in stable condition.  She tolerated the procedure well without any immediate complications.  IMPRESSION: 1. Submucosal proximal gastric nodule of doubtful clinical significance. 2. No obvious source of upper GI tract bleeding.  PLAN:  Based on her post prandial abdominal pain, will obtain abdominal ultrasound and will observe for any further bleeding.  I have performed a rectal exam which revealed dark brown stool which was heme-negative at the  end of the procedure. DD:  01/07/00 TD:  01/07/00 Job: [id number]

## 2010-06-13 NOTE — Op Note (Signed)
[facility]  Patient:    [name], [name]                    MRN: [id number] Proc. Date: 06/11/99 Adm. Date:  [id number] Attending:  Eleanora Neighbor CC:         Colleen Can. Deborah Chalk, M.D.             Cardiac Catheterization Lab             Madaline Savage, M.D.                           Operative Report  INDICATIONS FOR PROCEDURES:  Tachybrady syndrome.  PROCEDURE:  Insertion of a dual-chamber pulse generator, with atrioventricular lead systems under fluoroscopy.  DESCRIPTION OF PROCEDURE:  The right subclavicular area was prepped and draped.  The area was infiltrated with 1% Xylocaine, and then a subcutaneous pocket was created in the prepectoral fascia.  Punctures were made into the subclavian vein; they were made medial to the first rib.  We were able to enter the subclavian vein/axillary vein over the first rib, but were unable to thread the guide wires.  Using 8-[facility] introducers, atrioventricular leads were introduced.  The ventricular lead was a Guidant Model 4470 52 cm bipolar endocardial pacing lead, with active fixation (Serial No. [id number]).  Thresholds were 8.4 mV, R-waves threshold of 0.3 V at 0.5 ms, pulse with common impedance of 569 ohms, and a current of 0.5 mA.  The atrial lead was a Guidant Model 4469 45 cm bipolar endocardial pacing lead with active fixation (Serial No. [id number]).  The P-waves were 2.3 mV. Thresholds were not applicable.  Impedance was 412 ohms.  Current was not applicable.  The patient was in atrial fibrillation.  Both leads were sutured in place.  The wound was flushed with kanamycin solution.  The leads were connected to a Pulsar Maxx DR (Model 1270; Guidant Mfg: 006.  Serial No. [id number]).  Both leads were securely fastened.  We had to use a medical lubricant to advance the atrial lead far enough into the pulse generator.  The unit was sutured in place.  Wound was flushed  with kanamycin solution.  The wound was then closed with 2-0 and subsequently 5-0 Dexon.  Steri-Strips were applied.  Overall The patient tolerated the procedure well. DD:  06/11/99 TD:  06/12/99 Job: [id number]

## 2010-06-13 NOTE — Procedures (Signed)
[facility]  Patient:    [name], [name] Visit Number: [id number] MRN: [id number]          Service Type: END Location: ENDO Attending Physician:  Louie Bun Proc. Date: 10/29/00 Admit Date:  10/29/2000   CC:         Madaline Savage, M.D.   Procedure Report  PROCEDURE:  Colonoscopy.  INDICATION FOR PROCEDURE:  Heme-positive stools and a family history of colon cancer with a personal history of colon polyps.  DESCRIPTION OF PROCEDURE:  The patient was placed in the left lateral decubitus position and placed on the pulse monitor with continuous low-flow oxygen delivered by nasal cannula.  She was sedated with 7 mg IV Demerol and 50 mcg IV fentanyl.  The Olympus video colonoscope was inserted into the rectum and advanced to the cecum, confirmed by transillumination at McBurneys point and visualization of the ileocecal valve and appendiceal orifice.  The prep was excellent.  The cecum, ascending and transverse colon appeared normal with no masses, polyps, diverticula, or other mucosal abnormalities.  Within the sigmoid colon, there were seen multiple scattered diverticula and no other abnormalities.  The rectum appeared normal, and retroflex view of the anus revealed no obvious internal hemorrhoids.  The colonoscope was then withdrawn, and the patient returned to the recovery room in stable condition.  She tolerated the procedure well, and there were no immediate complications.  IMPRESSION:  Diverticulosis.  PLAN:  Repeat colonoscopy in three years based on her person history of colon polyps and family history of colon cancer. Attending Physician:  Louie Bun DD:  10/29/00 TD:  10/29/00 Job: [id number]

## 2010-06-13 NOTE — H&P (Signed)
[facility]  Patient:    [name], [name]                    MRN: [id number] Adm. Date:  [id number] Attending:  Melody Haver                         History and Physical  CHIEF COMPLAINT:  This is a 77 year old woman who presents with a history of GI bleeding.  HISTORY OF PRESENT ILLNESS:  The patient has had evaluation for heme-positive stools and melena in the past.  She had colonoscopy also due to the fact that she has a family history of colonic cancer and she had colonoscopy in March of 2001.  She also had gastroesophagoduodenoscopy in December of 2001.  She has been in her usual state of health until today, when she began having episodes of dark black stools with some bright red bleeding in her stools.  Of note is the fact that she had her prothrombin checked two weeks ago and had her Coumadin increased from 4 mg per day to alternating 4 and 6 mg per day and in addition, 24 hours prior to this admission, she took two aspirin tablets.  Her hemoglobin in the emergency room is 8.9.  PAST MEDICAL HISTORY:  She apparently has been in good health.  She has had atrial fibrillation in the past and has had a pacemaker inserted.  She is receiving Coumadin for that.  MEDICATIONS:  Medications prior to this admission include: 1. Ranitidine. 2. Skelaxin. 3. Coumadin 4 mg daily, except 6 mg on Monday, Wednesday and Friday. 4. Hydrochlorothiazide 25 mg one-half tab daily. 5. Veralan dose (?).  PERSONAL HISTORY:  She does not smoke or drink excessive amounts of alcohol. No history of allergies.  REVIEW OF SYSTEMS:  Her weight has been stable.  CARDIOVASCULAR/RESPIRATORY: No chest pain.  GI:  See above.  GU:  No complaints.  PHYSICAL EXAMINATION:  GENERAL:  This is a well-developed, moderately obese who appears clinically stable.  HEENT:  Her head is normocephalic.  NECK:  Her neck is supple.  No bruits are heard.  LUNGS:  Lungs are  clear.  BREASTS:  No masses are present.  CARDIOVASCULAR:  Rhythm is regular.  ABDOMEN:  Her abdomen is soft.  No masses are present.  No tenderness is present.  No bruits are noted.  RECTAL:  Deferred (done by the emergency room physician).  EXTREMITIES:  No pedal edema.  Pulses are present bilaterally.  NEUROMUSCULAR:  Grossly intact.  IMPRESSION: 1. Gastrointestinal bleeding secondary to Coumadin/aspirin. 2. History of atrial fibrillation. 3. Past history of diverticulosis (on colonoscopy). DD:  04/26/00 TD:  04/27/00 Job: [id number]

## 2010-06-13 NOTE — Assessment & Plan Note (Signed)
[facility]                         ELECTROPHYSIOLOGY OFFICE NOTE   NAME:[name], [name]                    MRN:          [id number]  DATE:04/26/2006                            DOB:          1923/10/20    Ms. Moeser is referred today by Dr. Delfin Edis for evaluation of  atrial flutter and fibrillation.   HISTORY OF PRESENT ILLNESS:  The patient is a very pleasant 77 year old  retired Engineer, civil (consulting) who has a history of palpitations for many years and  documented atrial fibrillation and most recently atrial flutter for over  5 years. The patient unfortunately had been symptomatic with atrial  arrhythmias and was tried on Rythmol but unfortunately maintained atrial  flutter and fibrillation. She is referred today for additional  evaluation. The patient's history is notable for some hypertension in  the past. She is on chronic Coumadin therapy. She has a history of  tachybrady syndrome and is status post pacemaker insertion.   Her additional past medical history is notable for chronic anemia,  history of anxiety and history of thyroid problems.   On family history, both of her parents are deceased, her mother of a  stroke at age 33 and her father from complications of pneumonia at age  73. She has one brother who is deceased of cancer.   Her review of systems is notable for fatigue and weakness. She has very  minimal palpitations. Her fatigue has actually improved after she has  gotten off of the Rythmol therapy. The rest of her review of systems was  negative except as noted in the HPI, except for dyspnea with exertion.   On physical exam, she is a pleasant, elderly woman in no acute distress.  Blood pressure today was 108/78. The pulse was 97 and irregular. The  respirations were 18. The weight was 195 pounds.  HEENT EXAM:  Normocephalic, atraumatic. Pupils are equal and round. The  oropharynx was moist. Sclerae was anicteric. The neck revealed no  jugular venous distention. There was no thyromegaly. Trachea was  midline. The carotids were 2+ and symmetric.  LUNGS:  Clear bilaterally to auscultation. No wheezes, rales or rhonchi  were present. There is no increased work of breathing.  CARDIOVASCULAR EXAM:  Revealed an irregular rhythm with normal S1 and  S2. There are no murmurs, rubs or gallops that I can appreciate.  The abdominal exam was obese, nontender, nondistended. There was no  organomegaly. The bowel sounds were present. There was no rebound or  guarding.  EXTREMITIES:  Demonstrate no clubbing, cyanosis, or edema. Pulses were  2+ and symmetric.  NEUROLOGICAL:  Alert and oriented x3 with cranial nerves intact.  Strength 5/5 and symmetric.   Her EKG demonstrates atypical flutter with intermittent ventricular  pacing.   Interrogation of her pacemaker demonstrates underlying VVIR mode. She  was approximately 50% V paced, 48% V sensed.   IMPRESSION:  1. Symptomatic atypical atrial flutter.  2. Probable atrial fibrillation.  3. Tachybrady syndrome status post pacemaker.  4. Fatigue and malaise on Rythmol therapy with no success in      terminating her atrial arrhythmias.  DISCUSSION:  I have discussed the treatment options with the patient in  detail. One will be to continue rate control therapy with beta blockers  plus or minus digoxin. One option would be inpatient hospitalization and  initiation of Tikosyn therapy. Third option would be consideration for  left atrial ablation, although I think this is probably overly  aggressive in this particular patient, and the final option would be AV  node ablation in conjunction with her permanent pacemaker. The risks,  benefits, goals and expectations of all of the above have been discussed  with the patient, and ultimately, I recommended that she be tried on  Tikosyn therapy. I make this recommendation, however, not knowing what  her renal function is, and if she has severe  renal insufficiency, then  this may have to be rethought. I have discussed this today with Conni Elliot, P.A.-C., and we will also try to contact Dr. Delfin Edis with  these issues.     Doylene Canning. Ladona Ridgel, MD  Electronically Signed    GWT/MedQ  DD: 04/26/2006  DT: 04/27/2006  Job #: [id number]

## 2010-06-13 NOTE — Op Note (Signed)
[name], [name]             ACCOUNT NO.:  [id number]   MEDICAL RECORD NO.:  [id number]          PATIENT TYPE:  OBV   LOCATION:  0098                         FACILITY:  [facility]   PHYSICIAN:  Timothy E. Earlene Plater, M.D. DATE OF BIRTH:  10-Nov-1923   DATE OF PROCEDURE:  03/27/2004  DATE OF DISCHARGE:                                 OPERATIVE REPORT   PREOPERATIVE DIAGNOSIS:  Acute and chronic cholecystolithiasis.   POSTOPERATIVE DIAGNOSIS:  Acute and chronic cholecystolithiasis.   OPERATION/PROCEDURE:  Laparoscopic cholecystectomy with operative  cholangiogram.   SURGEON:  Timothy E. Earlene Plater, M.D.   ASSISTANT:  Sandria Bales. Ezzard Standing, M.D.   ANESTHESIA:  General.   INDICATIONS:  The patient was seen yesterday for increasingly symptomatic  gallstones with fever and persistent pain and with her permission, the  surgery was scheduled urgently.  She was seen and evaluated this morning  again.  She was afebrile and pain free and seems stable.  Her cardiac status  is stable.  Preoperative antibiotics were given.  Her CBC and chemistry  profile were normal.  The permit signed and she was identified.   DESCRIPTION OF PROCEDURE:  The patient was taken back to the operating room.  Careful monitoring and PAS hose were applied and then general endotracheal  anesthesia administered.  Her abdomen is prepped and draped in the usual  fashion.  Marcaine 0.25% with epinephrine was used prior to each incision.  An infraumbilical incision made, fascia identified and opened vertically,  peritoneum entered without complications.  Hasson catheter placed, tied in  place with #1 Vicryl.  The abdomen was insufflated.  General peritoneoscopy  was unremarkable.  A second 10 mm trocar was placed in the mid epigastrium.  Two 5 mm trocars in the right upper quadrant.  The gallbladder was  thickened, seemed somewhat edematous.  It was grasped and placed on tension.  Dissection at the base of the gallbladder revealed a  normal-appearing cystic  duct.  It was carefully dissected out, a posterior window made.  An artery  posterior to this was identified.  A clip was placed on the gallbladder side  of the cystic duct, the cystic duct opened and the cholangiogram catheter  was threaded into the distal cystic duct.  A clip was placed and real time  cholangiography carried out showing rapid, smooth filling of the entire  biliary tree with free flow into the duodenum.  No abnormalities were noted.  Also to note the pancreatic duct filled partially.  Catheter clip removed.  The stump of the cystic duct triply clipped, the duct fully divided.  The  artery was then dissected out carefully, triply clipped and divided.  The  posterior wall of the gallbladder was quite edematous and densely adherent  to the liver bed. This was carefully dissected and, in spite of that, a hole  was made in the gallbladder, but no stones were spilled.  The gallbladder  was completely dissected from the bed in the liver, and placed in an  EndoCatch bag.  All bile was suctioned, copiously irrigated and suctioned.  The bed of the gallbladder inspected.  There were several oozing spots in  the bed.  These were carefully cauterized and then Surgicel was placed there  as well.  The bed was dry.  Irrigation was carried out until clear.  The  gallbladder was removed through the infundibulum incision which was then  tied under direct vision.  Further irrigation carried out.  All counts  initially correct, and then under direct vision all  irrigant, CO2, instruments and trocars were removed.  All skin incisions  checked and then each was closed with 4-0 Monocryl.  Steri-Strips applied.  Final counts correct.  She tolerated it well, was awakened and taken to the  recovery room in good condition.      TED/MEDQ  D:  03/27/2004  T:  03/27/2004  Job:  [id number]   cc:   Everardo All. Madilyn Fireman, M.D.  [address]  [place]  [place] [address]  Fax:  [phone]   Colleen Can. Deborah Chalk, M.D.  Fax: [phone]   Malachi Pro. Ambrose Mantle, M.D.  [address]  [place]  [place] [address]  Fax: [phone]

## 2010-06-13 NOTE — Discharge Summary (Signed)
NAME:  [name], [name]                       ACCOUNT NO.:  [id number]   MEDICAL RECORD NO.:  [id number]                   PATIENT TYPE:  INP   LOCATION:  5156                                 FACILITY:  [facility]   PHYSICIAN:  Wayne A. Sheffield Slider, M.D.                 DATE OF BIRTH:  04/26/1923   DATE OF ADMISSION:  06/18/2003  DATE OF DISCHARGE:  06/19/2003                                 DISCHARGE SUMMARY   DISCHARGE DIAGNOSES:  1. Diverticulitis.  2. Atrial fibrillation, status post cardioversion.  3. Hypertension.  4. Chronic anticoagulation.   DISCHARGE MEDICATIONS:  1. Augmentin 875 mg p.o. twice a day for seven days.  2. Coumadin 2.5 mg p.o. daily.  3. HCTZ 25 mg p.o. daily.  4. Verelan 240 mg p.o. daily.  5. Digoxin 0.125 mg p.o. daily.  6. Toprol XL 50 mg p.o. daily.  7. Lorazepam at bedtime.  8. Rhythmol 225 mg p.o. twice daily.   DISPOSITION:  The patient was discharged to home.   DISCHARGE INSTRUCTIONS:  1. Activity as tolerated.  2. Diet, low salt, low fat diet.   FOLLOW UP:  1. Dr. Cleta Alberts at [facility] after two weeks.  2. Dr. Deborah Chalk on Jun 22, 2003 as scheduled.   HISTORY:  This is a 77 year old white female sent from [facility] for a  diagnosed febrile illness lasting three days.  Fever was associated with  chills with a temperature of 102.8 and myalgia.  The patient was found to be  orthostatic by blood pressure and complained of left lower quadrant pain  tenderness and a white count of 2800.  The patient had recently undergone  cardioversion by Dr. Deborah Chalk for atrial fibrillation.   PHYSICAL EXAMINATION:  VITAL SIGNS: Blood pressure was 173/67, pulse rate of  79, respiratory rate of 18, temperature of 97.6, O2 saturation of 93% on  room air.  HEART: The patient had essentially normal physical exam findings except for  a grade 2/6 systolic murmur across the precordium without radiation to the  neck or axilla.  ABDOMEN: Soft with bowel sounds.   No hepatosplenomegaly.  No masses.  RECTAL EXAM: Large, soft stools and stool guaiac was negative.   LABORATORY DATA ON ADMISSION:  Hemoglobin 13.5, hematocrit 40.5, WBC of 2.7,  platelets 225,000 with an ANC of 1.4, sodium 137, potassium 3.4, chloride  100, CO2 of 29, BUN 18, creatinine 1.2, glucose 102, AST 54, ALT 46,  alkaline phosphatase 75, total bilirubin 1, total protein 6.5, albumin 3.4,  lipase 27.  Acute abdominal series showed no free air or fluid.  CT of the  abdomen showed mild sigmoid colonic diverticulitis.  No evidence of an  abscess.   COURSE AT THE HOSPITAL:  1. Diverticulitis.  The patient was started on Augmentin 875 mg p.o. b.i.d.     to complete a seven day course.  The patient did not have any fever  overnight.  2. Hypertension.  The patient's blood pressure was well-controlled with her     antihypertensive regimen.  3. Atrial fibrillation, status post cardioversion.  The patient was     maintained on her current cardiac medications in the form of Toprol and     Rhythmol.  The patient had subtherapeutic INR.  Coumadin was increased to     5 mg p.o. x1.  The patient was advised to followup with Dr. Deborah Chalk.  4. Fluid and electrolyte nutrition.  The patient was given 1 bolus of normal     saline solution then maintained on IV fluids.  On the day of discharge     the patient was tolerating p.o. fluids, _________ were negative and the     patient was ambulating without any symptoms of dizziness.   CONDITION ON DISCHARGE:  The patient was discharged with no complaints, with  good appetite and ambulating well with two stools overnight.  Blood pressure  was 148-121/51-59 with no fever.  The rest of the physical exam was  essentially normal.  The patient was advised to continue Augmentin as  instructed and to followup with Dr. Deborah Chalk on Jun 22, 2003.      Lawerance Sabal, MD                         Deniece Portela A. Sheffield Slider, M.D.    MC/MEDQ  D:  06/27/2003  T:  06/28/2003   Job:  [id number]   cc:   Brett Canales A. Cleta Alberts, M.D.  [address]  [place]  [place] [address]  Fax: [phone]   Colleen Can. Deborah Chalk, M.D.  Fax: [phone]   Everardo All. Madilyn Fireman, M.D.  [address]  [place]  [place] [address]  Fax: [phone]

## 2010-06-13 NOTE — Cardiovascular Report (Signed)
NAMESYNCERE, [name]             ACCOUNT NO.:  [id number]   MEDICAL RECORD NO.:  [id number]          PATIENT TYPE:  OIB   LOCATION:  2899                         FACILITY:  [facility]   PHYSICIAN:  Colleen Can. Deborah Chalk, M.D.DATE OF BIRTH:  31-Oct-1923   DATE OF PROCEDURE:  12/15/2004  DATE OF DISCHARGE:                              CARDIAC CATHETERIZATION   PROCEDURE:  Pacemaker battery change-out because of end-of-life parameters.   PROCEDURE:  The right subclavicular area was prepped and draped.  The area  was infiltrated with 1% Xylocaine.  Using sharp and Bovie dissection, the  old pulse generator was explanted.  The old pulse generator was implanted on  Jun 11, 1999 and is a [employer] model [id number] serial [id number].  Ventricular lead is a Guidant bipolar screw-in model 4469 serial [id number]  implanted on Jun 11, 1999.  Atrial lead is a Guidant bipolar active fixation  screw-in model 4470 serial #[id number] implanted on Jun 11, 1999.  Following  lead thresholds were measured.  The R-waves were measured 4 millivolts.  The  ventricular lead impedance was 380 ohms.  Ventricular capture is at 0.7  volts with current of 2.4 milliamps at 0.5 milliseconds pulse width.  P-  waves were measured at 0.6 millivolts.  The atrial lead impedance was 382  ohms.  The atrial capture threshold was 0.5 volts with a current 2 milliamps  at 0.5 milliseconds pulse width.   The wound was flushed with kanamycin solution.  The wound was closed with 2-  0 and subsequent 4-0 Vicryl and Steri-Strips were applied.  The patient  tolerated procedure well.      Colleen Can. Deborah Chalk, M.D.  Electronically Signed     SNT/MEDQ  D:  12/15/2004  T:  12/15/2004  Job:  [id number]

## 2010-06-13 NOTE — Op Note (Signed)
[name], [name]                       ACCOUNT NO.:  [id number]   MEDICAL RECORD NO.:  [id number]                   PATIENT TYPE:  OIB   LOCATION:  2899                                 FACILITY:  [facility]   PHYSICIAN:  Colleen Can. Deborah Chalk, M.D.            DATE OF BIRTH:  Feb 24, 1923   DATE OF PROCEDURE:  06/01/2003  DATE OF DISCHARGE:  06/01/2003                                 OPERATIVE REPORT   PROCEDURE PERFORMED:  Cardioversion.   CARDIOLOGIST:  Colleen Can. Deborah Chalk, M.D.   ANESTHESIOLOGIST AND ANESTHESIA:  Judie Petit, M.D.  IV Pentothal 175  mg.   DESCRIPTION OF PROCEDURE:  Using anterior posterior paddles 200 watt seconds  of synchronized energy was delivered with conversion to sinus rhythm.  The  pacer was interrogated pre- and post cardioversion, which was satisfactory,  and she was in an AV paced rhythm post cardioversion.                                               Colleen Can. Deborah Chalk, M.D.    SNT/MEDQ  D:  06/01/2003  T:  06/02/2003  Job:  [id number]

## 2010-06-13 NOTE — Op Note (Signed)
[name], [name]             ACCOUNT NO.:  [id number]   MEDICAL RECORD NO.:  [id number]          PATIENT TYPE:  AMB   LOCATION:  ENDO                         FACILITY:  [facility]   PHYSICIAN:  John C. Madilyn Fireman, M.D.    DATE OF BIRTH:  April 01, 1923   DATE OF PROCEDURE:  01/01/2004  DATE OF DISCHARGE:                                 OPERATIVE REPORT   PROCEDURE:  Colonoscopy.   INDICATIONS FOR PROCEDURE:  History of adenomatous colon polyps and a family  history of colon cancer in a first degree relative.   DESCRIPTION OF PROCEDURE:  The patient was placed in the left lateral  decubitus position and placed on the pulse monitor with continuous low flow  oxygen delivered by nasal cannula. She was sedated with 37.5 mcg IV fentanyl  and 6 mg IV Versed. The Olympus video colonoscope was inserted into the  rectum and advanced to the cecum, confirmed by transillumination at  McBurney's point and visualization of the ileocecal valve and appendiceal  orifice. The prep was excellent. Within the cecum, there were seen two AVM's  at the base. These were not bleeding and were not fulgurated.  The remainder  of  the cecum, ascending and transverse colon appeared normal with no  masses, polyps, diverticula or other mucosal abnormalities. Within the  descending and sigmoid colon, there were seen several scattered diverticula  and no other abnormalities.  The rectum appeared normal and retroflexed view  of the anus revealed no obvious internal hemorrhoids.  The scope was then  withdrawn and the patient returned to the recovery room in stable condition.  She tolerated the procedure well and there were no immediate complications.   IMPRESSION:  1.  Cecal arteriovenous malformations.  2.  Diverticulosis.   PLAN:  Will tentatively plan a repeat colonoscopy in five years. l      JCH/MEDQ  D:  01/01/2004  T:  01/01/2004  Job:  [id number]   cc:   Brett Canales A. Cleta Alberts, M.D.  [address]  [place]  [place]  [address]  Fax: [phone]

## 2010-06-26 ENCOUNTER — Encounter (INDEPENDENT_AMBULATORY_CARE_PROVIDER_SITE_OTHER): Payer: Medicare Other | Admitting: *Deleted

## 2010-06-26 DIAGNOSIS — I4891 Unspecified atrial fibrillation: Secondary | ICD-10-CM

## 2010-07-11 ENCOUNTER — Ambulatory Visit (INDEPENDENT_AMBULATORY_CARE_PROVIDER_SITE_OTHER): Payer: Medicare Other | Admitting: *Deleted

## 2010-07-11 DIAGNOSIS — I4891 Unspecified atrial fibrillation: Secondary | ICD-10-CM

## 2010-07-11 LAB — POCT INR: INR: 2

## 2010-07-14 ENCOUNTER — Other Ambulatory Visit: Payer: Self-pay | Admitting: *Deleted

## 2010-07-14 MED ORDER — POTASSIUM CHLORIDE 10 MEQ PO TBCR: 10.0000 meq | EXTENDED_RELEASE_TABLET | Freq: Every day | ORAL | Status: DC

## 2010-08-07 ENCOUNTER — Ambulatory Visit (INDEPENDENT_AMBULATORY_CARE_PROVIDER_SITE_OTHER): Payer: Medicare Other | Admitting: *Deleted

## 2010-08-07 DIAGNOSIS — I4891 Unspecified atrial fibrillation: Secondary | ICD-10-CM

## 2010-08-21 ENCOUNTER — Encounter: Payer: Medicare Other | Admitting: *Deleted

## 2010-08-26 ENCOUNTER — Encounter: Payer: Self-pay | Admitting: *Deleted

## 2010-08-28 ENCOUNTER — Ambulatory Visit (INDEPENDENT_AMBULATORY_CARE_PROVIDER_SITE_OTHER): Payer: Medicare Other | Admitting: *Deleted

## 2010-08-28 DIAGNOSIS — I4891 Unspecified atrial fibrillation: Secondary | ICD-10-CM

## 2010-09-05 ENCOUNTER — Telehealth: Payer: Self-pay | Admitting: Nurse Practitioner

## 2010-09-05 NOTE — Telephone Encounter (Signed)
Pt wanted to speak to [facility] about coumadin, before could ask anymore questions pt states have the nurse call and hung up, chart in box

## 2010-09-05 NOTE — Telephone Encounter (Signed)
Called stating her heart is "racing". She has taken extra Lanoxin but is still irreg. States rate is about 90-100. She does have pacemaker. States not really bothering her but she just wanted to know if she should be seen. Advised if continues and doesn't feel good then needs to go to Urgent care or ER. States she will call back Mon if still bothering her,

## 2010-09-08 ENCOUNTER — Other Ambulatory Visit: Payer: Self-pay | Admitting: *Deleted

## 2010-09-08 MED ORDER — DIGOXIN 125 MCG PO TABS: 125.0000 ug | ORAL_TABLET | Freq: Every day | ORAL | Status: DC

## 2010-09-08 NOTE — Telephone Encounter (Signed)
escribe medication per fax request  

## 2010-09-18 ENCOUNTER — Ambulatory Visit (INDEPENDENT_AMBULATORY_CARE_PROVIDER_SITE_OTHER): Payer: Medicare Other | Admitting: *Deleted

## 2010-09-18 DIAGNOSIS — I4891 Unspecified atrial fibrillation: Secondary | ICD-10-CM

## 2010-09-23 ENCOUNTER — Encounter: Payer: Self-pay | Admitting: Nurse Practitioner

## 2010-09-23 ENCOUNTER — Ambulatory Visit (INDEPENDENT_AMBULATORY_CARE_PROVIDER_SITE_OTHER): Payer: Medicare Other | Admitting: Nurse Practitioner

## 2010-09-23 VITALS — BP 128/80 | HR 66 | Ht 65.0 in | Wt 165.2 lb

## 2010-09-23 DIAGNOSIS — I10 Essential (primary) hypertension: Secondary | ICD-10-CM

## 2010-09-23 DIAGNOSIS — I4891 Unspecified atrial fibrillation: Secondary | ICD-10-CM

## 2010-09-23 DIAGNOSIS — Z7901 Long term (current) use of anticoagulants: Secondary | ICD-10-CM

## 2010-09-23 DIAGNOSIS — IMO0002 Reserved for concepts with insufficient information to code with codable children: Secondary | ICD-10-CM

## 2010-09-23 DIAGNOSIS — I313 Pericardial effusion (noninflammatory): Secondary | ICD-10-CM

## 2010-09-23 DIAGNOSIS — Z950 Presence of cardiac pacemaker: Secondary | ICD-10-CM

## 2010-09-23 DIAGNOSIS — I319 Disease of pericardium, unspecified: Secondary | ICD-10-CM

## 2010-09-23 LAB — CBC WITH DIFFERENTIAL/PLATELET
Basophils Absolute: 0 10*3/uL (ref 0.0–0.1)
Basophils Relative: 0.5 % (ref 0.0–3.0)
Eosinophils Absolute: 0.3 10*3/uL (ref 0.0–0.7)
Eosinophils Relative: 4 % (ref 0.0–5.0)
HCT: 42.5 % (ref 36.0–46.0)
Hemoglobin: 14.2 g/dL (ref 12.0–15.0)
Lymphocytes Relative: 20.5 % (ref 12.0–46.0)
Lymphs Abs: 1.3 10*3/uL (ref 0.7–4.0)
MCHC: 33.4 g/dL (ref 30.0–36.0)
MCV: 88.9 fl (ref 78.0–100.0)
Monocytes Absolute: 0.6 10*3/uL (ref 0.1–1.0)
Monocytes Relative: 9.1 % (ref 3.0–12.0)
Neutro Abs: 4.3 10*3/uL (ref 1.4–7.7)
Neutrophils Relative %: 65.9 % (ref 43.0–77.0)
Platelets: 279 10*3/uL (ref 150.0–400.0)
RBC: 4.77 Mil/uL (ref 3.87–5.11)
RDW: 14.2 % (ref 11.5–14.6)
WBC: 6.5 10*3/uL (ref 4.5–10.5)

## 2010-09-23 LAB — HEPATIC FUNCTION PANEL
ALT: 23 U/L (ref 0–35)
AST: 28 U/L (ref 0–37)
Albumin: 3.9 g/dL (ref 3.5–5.2)
Alkaline Phosphatase: 79 U/L (ref 39–117)
Bilirubin, Direct: 0.2 mg/dL (ref 0.0–0.3)
Total Bilirubin: 0.7 mg/dL (ref 0.3–1.2)
Total Protein: 6.9 g/dL (ref 6.0–8.3)

## 2010-09-23 LAB — LIPID PANEL
Cholesterol: 180 mg/dL (ref 0–200)
HDL: 40.8 mg/dL (ref >39.00)
LDL Cholesterol: 114 mg/dL — ABNORMAL HIGH (ref 0–99)
Total CHOL/HDL Ratio: 4
Triglycerides: 128 mg/dL (ref 0.0–149.0)
VLDL: 25.6 mg/dL (ref 0.0–40.0)

## 2010-09-23 LAB — BASIC METABOLIC PANEL
BUN: 17 mg/dL (ref 6–23)
CO2: 32 mEq/L (ref 19–32)
Calcium: 9.7 mg/dL (ref 8.4–10.5)
Chloride: 105 mEq/L (ref 96–112)
Creatinine, Ser: 0.8 mg/dL (ref 0.4–1.2)
GFR: 72.35 mL/min (ref >60.00)
Glucose, Bld: 145 mg/dL — ABNORMAL HIGH (ref 70–99)
Potassium: 4.9 mEq/L (ref 3.5–5.1)
Sodium: 146 mEq/L — ABNORMAL HIGH (ref 135–145)

## 2010-09-23 LAB — TSH: TSH: 2.28 u[IU]/mL (ref 0.35–5.50)

## 2010-09-23 NOTE — Assessment & Plan Note (Signed)
Last evaluation was in April of 2012. Effusion was trivial.

## 2010-09-23 NOTE — Progress Notes (Signed)
[name] Date of Birth: November 28, 1923   History of Present Illness: [name] is seen back today for her 6 month visit. She is seen for Dr. Elease Hashimoto. She is a former patient of Dr. Ronnald Nian. She is now 83. She is doing ok. She tries to remain active. No falls. No chest pain. She is on Digoxin 2 times a day which helps smooth her rhythm out. She is always reminding [place] that she does not tolerate Toprol. That made her feet blue. She is not having palpitations or shortness of breath. She is due for labs today. She is tolerating her medicines.   Current Outpatient Prescriptions on File Prior to Visit  Medication Sig Dispense Refill  . Calcium Carbonate-Vitamin D (CALCIUM + D PO) Take by mouth daily.        . digoxin (LANOXIN) 0.125 MG tablet Take 1 tablet (125 mcg total) by mouth daily. Taking one tablet in the morning and a one of a tablet in the evening  60 tablet  6  . diphenhydrAMINE (BENADRYL) 25 MG tablet Take 25 mg by mouth every 6 (six) hours as needed.        . multivitamin (THERAGRAN) per tablet Take 1 tablet by mouth daily.        . Thiamine HCl (VITAMIN B-1 PO) Take by mouth daily.        Marland Kitchen torsemide (DEMADEX) 20 MG tablet Take 1 tablet (20 mg total) by mouth daily.  30 tablet  11  . verapamil (CALAN-SR) 240 MG CR tablet Take 1 tablet (240 mg total) by mouth at bedtime.  30 tablet  4  . warfarin (COUMADIN) 5 MG tablet Take 5 mg by mouth daily. As directed. Currently taking 5mg  x 6 days and 2.5mg  x 1 day.       Marland Kitchen DISCONTD: potassium chloride (KLOR-CON) 10 MEQ CR tablet Take 1 tablet (10 mEq total) by mouth daily.  30 tablet  6    No Known Allergies  Past Medical History  Diagnosis Date  . Pericardial effusion     Chronic. Last echo in April of 2012.   . Atrial fibrillation     Chronic  . Chronic anticoagulation     on Coumadin  . History of GI bleed   . HTN (hypertension)   . Sleep apnea   . Pacemaker     placed for tachybrady.   . Valvular heart disease   . Obesity    . LVH (left ventricular hypertrophy)     Past Surgical History  Procedure Date  . Pacemaker insertion     Last generator in 2006  . Breast lumpectomy     bilaterally  . Tonsillectomy   . Adenoidectomy     History  Smoking status  . Never Smoker   Smokeless tobacco  . Never Used    History  Alcohol Use No    Family History  Problem Relation Age of Onset  . Stroke Mother   . Stroke Father   . Pneumonia Father     Review of Systems: The review of systems is as above.  All other systems were reviewed and are negative.  Physical Exam: BP 128/80  Pulse 66  Ht 5\' 5"  (1.651 m)  Wt 165 lb 3.2 oz (74.934 kg)  BMI 27.49 kg/m2 Patient is very pleasant and in no acute distress. She looks younger than her stated age. Skin is warm and dry. Color is normal.  HEENT is unremarkable. Normocephalic/atraumatic. PERRL. Sclera are  nonicteric. Neck is supple. No masses. No JVD. Lungs are clear. Cardiac exam shows a fairly regular rate and rhythm. Abdomen is soft. Extremities are without edema. She has her support stockings in place. Gait and ROM are intact. No gross neurologic deficits noted.   LABORATORY DATA: PENDING   Assessment / Plan:

## 2010-09-23 NOTE — Assessment & Plan Note (Signed)
Blood pressure is good. No change in her medicines.  

## 2010-09-23 NOTE — Assessment & Plan Note (Signed)
This is chronic. Rate is well controlled. Pacemaker is in place. She will see Dr. Elease Hashimoto in 6 months and continue with her pacemaker follow up in device clinic.

## 2010-09-23 NOTE — Assessment & Plan Note (Signed)
No adverse effects from her coumadin.

## 2010-09-23 NOTE — Patient Instructions (Signed)
Stay on your current medicines We will check your labs today You will see Dr. Delane Ginger in 6 months for your next visit.  Call for any problems.

## 2010-09-23 NOTE — Assessment & Plan Note (Signed)
She is followed in device clinic.

## 2010-09-24 LAB — DIGOXIN LEVEL: Digoxin Level: 1.6 ng/mL (ref 0.8–2.0)

## 2010-10-13 ENCOUNTER — Other Ambulatory Visit: Payer: Self-pay | Admitting: Cardiology

## 2010-10-13 NOTE — Telephone Encounter (Signed)
Pharmacy called and had dt tennant name removed and dr g taylor placed on new script.

## 2010-10-15 ENCOUNTER — Other Ambulatory Visit: Payer: Self-pay

## 2010-10-15 DIAGNOSIS — I4891 Unspecified atrial fibrillation: Secondary | ICD-10-CM

## 2010-10-15 MED ORDER — VERAPAMIL HCL 240 MG PO TBCR: 240.0000 mg | EXTENDED_RELEASE_TABLET | Freq: Every day | ORAL | Status: DC

## 2010-10-16 ENCOUNTER — Ambulatory Visit (INDEPENDENT_AMBULATORY_CARE_PROVIDER_SITE_OTHER): Payer: Medicare Other | Admitting: *Deleted

## 2010-10-16 ENCOUNTER — Telehealth: Payer: Self-pay | Admitting: Cardiovascular Disease

## 2010-10-16 DIAGNOSIS — I4891 Unspecified atrial fibrillation: Secondary | ICD-10-CM

## 2010-10-16 NOTE — Telephone Encounter (Signed)
Pt calling wanting to see if she can talk to Norma Fredrickson either in the office or on the phone prior to pt appt for coumadin today. Please return pt call to discuss further.

## 2010-10-16 NOTE — Telephone Encounter (Signed)
Pt wanted to see Lawson Fiscal in the next 40 minutes to talk with her for just 10 minutes. Informed she was not seeing pts but could fit her in tomorrow afternoon. Pt refused offer. No appointment made.

## 2010-11-04 ENCOUNTER — Other Ambulatory Visit: Payer: Self-pay | Admitting: Pharmacist

## 2010-11-04 MED ORDER — WARFARIN SODIUM 5 MG PO TABS: 5.0000 mg | ORAL_TABLET | Freq: Every day | ORAL | Status: DC

## 2010-11-13 ENCOUNTER — Ambulatory Visit (INDEPENDENT_AMBULATORY_CARE_PROVIDER_SITE_OTHER): Payer: Medicare Other | Admitting: *Deleted

## 2010-11-13 DIAGNOSIS — I4891 Unspecified atrial fibrillation: Secondary | ICD-10-CM

## 2010-11-13 LAB — POCT INR: INR: 2.7

## 2010-11-27 ENCOUNTER — Ambulatory Visit (INDEPENDENT_AMBULATORY_CARE_PROVIDER_SITE_OTHER): Payer: Medicare Other | Admitting: *Deleted

## 2010-11-27 ENCOUNTER — Encounter: Payer: Self-pay | Admitting: Pharmacist

## 2010-11-27 DIAGNOSIS — I4891 Unspecified atrial fibrillation: Secondary | ICD-10-CM

## 2010-11-27 LAB — POCT INR: INR: 2.1

## 2010-11-27 NOTE — Telephone Encounter (Signed)
This encounter was created in error - please disregard.

## 2010-12-17 ENCOUNTER — Ambulatory Visit (INDEPENDENT_AMBULATORY_CARE_PROVIDER_SITE_OTHER): Payer: Medicare Other | Admitting: *Deleted

## 2010-12-17 DIAGNOSIS — I4891 Unspecified atrial fibrillation: Secondary | ICD-10-CM

## 2010-12-17 LAB — POCT INR: INR: 2.3

## 2011-01-14 ENCOUNTER — Ambulatory Visit (INDEPENDENT_AMBULATORY_CARE_PROVIDER_SITE_OTHER): Payer: Medicare Other | Admitting: *Deleted

## 2011-01-14 DIAGNOSIS — I4891 Unspecified atrial fibrillation: Secondary | ICD-10-CM

## 2011-02-03 ENCOUNTER — Telehealth: Payer: Self-pay | Admitting: Nurse Practitioner

## 2011-02-03 NOTE — Telephone Encounter (Signed)
LOV,Echo faxed to [facility] @ [phone] 02/03/11/KM

## 2011-02-04 ENCOUNTER — Ambulatory Visit (INDEPENDENT_AMBULATORY_CARE_PROVIDER_SITE_OTHER): Payer: Medicare Other | Admitting: *Deleted

## 2011-02-04 DIAGNOSIS — I4891 Unspecified atrial fibrillation: Secondary | ICD-10-CM

## 2011-02-04 LAB — POCT INR: INR: 2.2

## 2011-02-09 ENCOUNTER — Other Ambulatory Visit: Payer: Self-pay | Admitting: Emergency Medicine

## 2011-02-09 DIAGNOSIS — N644 Mastodynia: Secondary | ICD-10-CM

## 2011-02-16 ENCOUNTER — Other Ambulatory Visit: Payer: Self-pay | Admitting: Family Medicine

## 2011-02-27 ENCOUNTER — Other Ambulatory Visit: Payer: Self-pay | Admitting: *Deleted

## 2011-02-27 MED ORDER — DIGOXIN 125 MCG PO TABS: 125.0000 ug | ORAL_TABLET | Freq: Every day | ORAL | Status: DC

## 2011-02-27 NOTE — Telephone Encounter (Signed)
Refilled lanoxin 

## 2011-03-04 ENCOUNTER — Encounter: Payer: Medicare Other | Admitting: *Deleted

## 2011-03-05 ENCOUNTER — Encounter: Payer: Self-pay | Admitting: *Deleted

## 2011-03-16 ENCOUNTER — Other Ambulatory Visit: Payer: Self-pay | Admitting: Internal Medicine

## 2011-03-17 ENCOUNTER — Other Ambulatory Visit: Payer: Self-pay | Admitting: Dermatology

## 2011-04-02 ENCOUNTER — Ambulatory Visit
Admission: RE | Admit: 2011-04-02 | Discharge: 2011-04-02 | Disposition: A | Discharge status: Home / Self Care | Payer: Medicare Other | Source: Ambulatory Visit | Attending: Emergency Medicine | Admitting: Emergency Medicine

## 2011-04-02 DIAGNOSIS — N644 Mastodynia: Secondary | ICD-10-CM

## 2011-04-03 ENCOUNTER — Emergency Department (HOSPITAL_COMMUNITY)
Admission: EM | Admit: 2011-04-03 | Discharge: 2011-04-04 | Disposition: A | Discharge status: Home / Self Care | Payer: Medicare Other | Attending: Emergency Medicine | Admitting: Emergency Medicine

## 2011-04-03 ENCOUNTER — Encounter (HOSPITAL_COMMUNITY): Payer: Self-pay | Admitting: Emergency Medicine

## 2011-04-03 DIAGNOSIS — E669 Obesity, unspecified: Secondary | ICD-10-CM | POA: Insufficient documentation

## 2011-04-03 DIAGNOSIS — Z7901 Long term (current) use of anticoagulants: Secondary | ICD-10-CM | POA: Insufficient documentation

## 2011-04-03 DIAGNOSIS — R609 Edema, unspecified: Secondary | ICD-10-CM | POA: Insufficient documentation

## 2011-04-03 DIAGNOSIS — R109 Unspecified abdominal pain: Secondary | ICD-10-CM | POA: Insufficient documentation

## 2011-04-03 DIAGNOSIS — G4730 Sleep apnea, unspecified: Secondary | ICD-10-CM | POA: Insufficient documentation

## 2011-04-03 DIAGNOSIS — Z950 Presence of cardiac pacemaker: Secondary | ICD-10-CM | POA: Insufficient documentation

## 2011-04-03 DIAGNOSIS — I10 Essential (primary) hypertension: Secondary | ICD-10-CM | POA: Insufficient documentation

## 2011-04-03 DIAGNOSIS — I4891 Unspecified atrial fibrillation: Secondary | ICD-10-CM | POA: Insufficient documentation

## 2011-04-03 DIAGNOSIS — R110 Nausea: Secondary | ICD-10-CM | POA: Insufficient documentation

## 2011-04-03 NOTE — ED Notes (Signed)
Pt states she started having abd cramping today around 2pm  States she went to urgent care and was sent here from there for further evaluation  Pt states she normally has 3 to 4 bowel movements a day but has not had one yesterday or today

## 2011-04-03 NOTE — ED Provider Notes (Signed)
History     CSN: [id number]  Arrival date & time 04/03/11  2125   First MD Initiated Contact with Patient 04/03/11 2355      Chief Complaint  Patient presents with  . Abdominal Cramping    (Consider location/radiation/quality/duration/timing/severity/associated sxs/prior treatment) HPI This is a [age]-year-old white female who complains of abdominal cramping since about 2 PM today. The cramping is located in the lower abdomen. It was associated with nausea but no vomiting. She had not had a bowel movement today which is not usual for her. She normally has bowel movements every day. She was seen in urgent care earlier and sent here for further evaluation. She states about 30 minutes ago she had a "3 pound" bowel movement completely relieved her symptoms. She is now without complaint. She doesn't knowledge that her lower legs are usually swollen.  Past Medical History  Diagnosis Date  . Pericardial effusion     Chronic. Last echo in April of 2012.   . Atrial fibrillation     Chronic  . Chronic anticoagulation     on Coumadin  . History of GI bleed   . HTN (hypertension)   . Sleep apnea   . Pacemaker     placed for tachybrady.   . Valvular heart disease   . Obesity   . LVH (left ventricular hypertrophy)     Past Surgical History  Procedure Date  . Pacemaker insertion     Last generator in 2006  . Breast lumpectomy     bilaterally  . Tonsillectomy   . Adenoidectomy     Family History  Problem Relation Age of Onset  . Stroke Mother   . Stroke Father   . Pneumonia Father     History  Substance Use Topics  . Smoking status: Never Smoker   . Smokeless tobacco: Never Used  . Alcohol Use: No    OB History    Grav Para Term Preterm Abortions TAB SAB Ect Mult Living                  Review of Systems  All other systems reviewed and are negative.    Allergies  Review of patient's allergies indicates no known allergies.  Home Medications   Current Outpatient  Rx  Name Route Sig Dispense Refill  . CALCIUM CARBONATE-VITAMIN D 500-200 MG-UNIT PO TABS Oral Take 1 tablet by mouth daily.    Marland Kitchen DIGOXIN 0.125 MG PO TABS Oral Take 1 tablet (125 mcg total) by mouth daily. Taking one tablet in the morning and a one of a tablet in the evening 60 tablet 6    Dr. Deborah Chalk has retired. Further refills will be b ...  . DIPHENHYDRAMINE HCL 25 MG PO TABS Oral Take 25 mg by mouth every 6 (six) hours as needed. Sleep    . MULTIVITAMINS PO TABS Oral Take 1 tablet by mouth daily.      Marland Kitchen VITAMIN B-1 PO Oral Take by mouth daily.      . TORSEMIDE 20 MG PO TABS Oral Take 1 tablet (20 mg total) by mouth daily. 30 tablet 11  . VERAPAMIL HCL ER 240 MG PO TBCR  take 1 tablet by mouth at bedtime 30 tablet 4  . WARFARIN SODIUM 5 MG PO TABS Oral Take 2.5-5 mg by mouth See admin instructions. As directed. Currently taking 5mg  5 days a week. and 2.5mg  on monday and Friday.      BP 133/63  Pulse 101  Temp(Src)  99.9 F (37.7 C) (Oral)  Resp 18  SpO2 92%  Physical Exam General: Well-developed, well-nourished female in no acute distress; appearance consistent with age of record HENT: normocephalic, atraumatic Eyes: pupils equal round and reactive to light; extraocular muscles intact; arcus senilis right greater the left Neck: supple Heart: regular rate and rhythm Lungs: clear to auscultation bilaterally Abdomen: soft; nondistended; nontender; no masses or hepatosplenomegaly; bowel sounds present Extremities: No deformity; full range of motion; edema of lower legs with compression stockings in place Neurologic: Awake, alert and oriented; motor function intact in all extremities and symmetric; no facial droop Skin: Warm and dry Psychiatric: Normal mood and affect    ED Course  Procedures (including critical care time)     MDM           Hanley Seamen, MD 04/04/11 0004

## 2011-04-03 NOTE — ED Notes (Signed)
Patient states that she has had abdominal pain since 2pm. States no bowel movement since Wednesday. Went to urgent care for evaluation and was sent here for further evaluation. Patient states that she has checked her rectum with her finger and does not feel any stool. States she has the urge to go to the bathroom but is unable to do so. 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

## 2011-04-04 LAB — URINALYSIS, ROUTINE W REFLEX MICROSCOPIC
Hgb urine dipstick: NEGATIVE
Ketones, ur: NEGATIVE mg/dL
Protein, ur: NEGATIVE mg/dL
Urobilinogen, UA: 0.2 mg/dL (ref 0.0–1.0)

## 2011-04-04 LAB — URINE MICROSCOPIC-ADD ON

## 2011-04-04 NOTE — Discharge Instructions (Signed)

## 2011-04-04 NOTE — ED Notes (Signed)
Patient states she went to the bathroom and had a large bowel movement and states she feels better after having bowel movement.

## 2011-04-28 ENCOUNTER — Other Ambulatory Visit: Payer: Self-pay

## 2011-05-16 ENCOUNTER — Inpatient Hospital Stay (HOSPITAL_COMMUNITY)
Admission: EM | Admit: 2011-05-16 | Discharge: 2011-05-20 | DRG: 065 | Disposition: A | Discharge status: Rehabilitation Facility | Payer: Medicare Other | Attending: Family Medicine | Admitting: Family Medicine

## 2011-05-16 ENCOUNTER — Encounter (HOSPITAL_COMMUNITY): Payer: Self-pay

## 2011-05-16 ENCOUNTER — Emergency Department (HOSPITAL_COMMUNITY): Payer: Medicare Other

## 2011-05-16 DIAGNOSIS — R531 Weakness: Secondary | ICD-10-CM

## 2011-05-16 DIAGNOSIS — I10 Essential (primary) hypertension: Secondary | ICD-10-CM | POA: Insufficient documentation

## 2011-05-16 DIAGNOSIS — I6350 Cerebral infarction due to unspecified occlusion or stenosis of unspecified cerebral artery: Principal | ICD-10-CM | POA: Diagnosis present

## 2011-05-16 DIAGNOSIS — K921 Melena: Secondary | ICD-10-CM | POA: Diagnosis not present

## 2011-05-16 DIAGNOSIS — G8190 Hemiplegia, unspecified affecting unspecified side: Secondary | ICD-10-CM | POA: Diagnosis present

## 2011-05-16 DIAGNOSIS — G589 Mononeuropathy, unspecified: Secondary | ICD-10-CM | POA: Diagnosis present

## 2011-05-16 DIAGNOSIS — Z950 Presence of cardiac pacemaker: Secondary | ICD-10-CM | POA: Insufficient documentation

## 2011-05-16 DIAGNOSIS — I313 Pericardial effusion (noninflammatory): Secondary | ICD-10-CM

## 2011-05-16 DIAGNOSIS — I4891 Unspecified atrial fibrillation: Secondary | ICD-10-CM

## 2011-05-16 DIAGNOSIS — I4821 Permanent atrial fibrillation: Secondary | ICD-10-CM | POA: Insufficient documentation

## 2011-05-16 DIAGNOSIS — R609 Edema, unspecified: Secondary | ICD-10-CM

## 2011-05-16 DIAGNOSIS — K449 Diaphragmatic hernia without obstruction or gangrene: Secondary | ICD-10-CM | POA: Diagnosis present

## 2011-05-16 DIAGNOSIS — K922 Gastrointestinal hemorrhage, unspecified: Secondary | ICD-10-CM | POA: Diagnosis not present

## 2011-05-16 DIAGNOSIS — R29898 Other symptoms and signs involving the musculoskeletal system: Secondary | ICD-10-CM

## 2011-05-16 DIAGNOSIS — R7889 Finding of other specified substances, not normally found in blood: Secondary | ICD-10-CM

## 2011-05-16 DIAGNOSIS — Z7901 Long term (current) use of anticoagulants: Secondary | ICD-10-CM | POA: Insufficient documentation

## 2011-05-16 DIAGNOSIS — K5900 Constipation, unspecified: Secondary | ICD-10-CM | POA: Diagnosis present

## 2011-05-16 LAB — COMPREHENSIVE METABOLIC PANEL
ALT: 22 U/L (ref 0–35)
Albumin: 4.2 g/dL (ref 3.5–5.2)
Alkaline Phosphatase: 90 U/L (ref 39–117)
BUN: 16 mg/dL (ref 6–23)
Chloride: 91 mEq/L — ABNORMAL LOW (ref 96–112)
Potassium: 3 mEq/L — ABNORMAL LOW (ref 3.5–5.1)
Sodium: 138 mEq/L (ref 135–145)
Total Bilirubin: 1.1 mg/dL (ref 0.3–1.2)

## 2011-05-16 LAB — DIFFERENTIAL
Basophils Relative: 0 % (ref 0–1)
Lymphs Abs: 1.2 10*3/uL (ref 0.7–4.0)
Monocytes Relative: 10 % (ref 3–12)
Neutro Abs: 5.9 10*3/uL (ref 1.7–7.7)
Neutrophils Relative %: 74 % (ref 43–77)

## 2011-05-16 LAB — CBC
Hemoglobin: 13.5 g/dL (ref 12.0–15.0)
MCHC: 33.4 g/dL (ref 30.0–36.0)
Platelets: 278 10*3/uL (ref 150–400)
RBC: 4.52 MIL/uL (ref 3.87–5.11)

## 2011-05-16 LAB — URINALYSIS, ROUTINE W REFLEX MICROSCOPIC
Bilirubin Urine: NEGATIVE
Glucose, UA: NEGATIVE mg/dL
Ketones, ur: NEGATIVE mg/dL
Protein, ur: NEGATIVE mg/dL
pH: 7 (ref 5.0–8.0)

## 2011-05-16 LAB — PROTIME-INR
INR: 1.71 — ABNORMAL HIGH (ref 0.00–1.49)
Prothrombin Time: 20.4 seconds — ABNORMAL HIGH (ref 11.6–15.2)

## 2011-05-16 LAB — DIGOXIN LEVEL: Digoxin Level: 2.1 ng/mL — ABNORMAL HIGH (ref 0.8–2.0)

## 2011-05-16 MED ORDER — BISACODYL 10 MG RE SUPP: 10.0000 mg | Freq: Every day | RECTAL | Status: DC | PRN

## 2011-05-16 MED ORDER — WARFARIN - PHARMACIST DOSING INPATIENT: Freq: Every day | Status: DC

## 2011-05-16 MED ORDER — SODIUM CHLORIDE 0.9 % IJ SOLN
3.0000 mL | Freq: Two times a day (BID) | INTRAMUSCULAR | Status: DC
  Administered 2011-05-16 – 2011-05-20 (×6): 3 mL via INTRAVENOUS

## 2011-05-16 MED ORDER — SODIUM CHLORIDE 0.9 % IJ SOLN: 3.0000 mL | Freq: Two times a day (BID) | INTRAMUSCULAR | Status: DC

## 2011-05-16 MED ORDER — POLYETHYLENE GLYCOL 3350 17 G PO PACK
17.0000 g | PACK | Freq: Once | ORAL | Status: AC
  Administered 2011-05-16: 17 g via ORAL
  Filled 2011-05-16: qty 1

## 2011-05-16 MED ORDER — TORSEMIDE 20 MG PO TABS
20.0000 mg | ORAL_TABLET | Freq: Every day | ORAL | Status: DC
  Administered 2011-05-17 – 2011-05-20 (×4): 20 mg via ORAL
  Filled 2011-05-16 (×4): qty 1

## 2011-05-16 MED ORDER — SODIUM CHLORIDE 0.9 % IV SOLN: 250.0000 mL | INTRAVENOUS | Status: DC | PRN

## 2011-05-16 MED ORDER — SODIUM CHLORIDE 0.9 % IV BOLUS (SEPSIS)
500.0000 mL | Freq: Once | INTRAVENOUS | Status: AC
  Administered 2011-05-16: 500 mL via INTRAVENOUS

## 2011-05-16 MED ORDER — VERAPAMIL HCL ER 240 MG PO TBCR
240.0000 mg | EXTENDED_RELEASE_TABLET | Freq: Every day | ORAL | Status: DC
  Administered 2011-05-17: 240 mg via ORAL
  Filled 2011-05-16 (×3): qty 1

## 2011-05-16 MED ORDER — SODIUM CHLORIDE 0.9 % IJ SOLN: 3.0000 mL | INTRAMUSCULAR | Status: DC | PRN

## 2011-05-16 MED ORDER — ASPIRIN 81 MG PO CHEW
324.0000 mg | CHEWABLE_TABLET | Freq: Once | ORAL | Status: AC
  Administered 2011-05-16: 324 mg via ORAL
  Filled 2011-05-16: qty 4

## 2011-05-16 MED ORDER — POLYETHYLENE GLYCOL 3350 17 G PO PACK
17.0000 g | PACK | Freq: Every day | ORAL | Status: DC
  Administered 2011-05-17 – 2011-05-18 (×2): 17 g via ORAL
  Filled 2011-05-16 (×2): qty 1

## 2011-05-16 MED ORDER — POTASSIUM CHLORIDE CRYS ER 20 MEQ PO TBCR
40.0000 meq | EXTENDED_RELEASE_TABLET | Freq: Three times a day (TID) | ORAL | Status: AC
  Administered 2011-05-16 – 2011-05-17 (×3): 40 meq via ORAL
  Filled 2011-05-16 (×3): qty 2

## 2011-05-16 MED ORDER — WARFARIN SODIUM 5 MG PO TABS
5.0000 mg | ORAL_TABLET | Freq: Once | ORAL | Status: AC
  Administered 2011-05-16: 5 mg via ORAL
  Filled 2011-05-16: qty 1

## 2011-05-16 MED ORDER — DIGOXIN 125 MCG PO TABS: 125.0000 ug | ORAL_TABLET | Freq: Two times a day (BID) | ORAL | Status: DC

## 2011-05-16 NOTE — ED Notes (Signed)
MD at bedside. Bridgette EDPA

## 2011-05-16 NOTE — ED Notes (Signed)
Pt awaiting neuro orders

## 2011-05-16 NOTE — H&P (Signed)
[name] is an 78 y.o. female.   History and Physical Note Family Medicine Teaching Service Service Pager: [phone]  Chief Complaint: LLE Weakness  HPI: [name] is an 78 y.o. female with a history of A fib, HTN and h/o pacemaket, presenting to ED with complaint of left lower extremity weakness and some weakness of left hand.  Patient states she woke up a 4:00am to go to the restroom and felt like she was "walking funny". Went back to bed for a few hours, and noticed weakness was getting progressively worse when she woke up around 5:30. She states she was dragging her left foot and her gait was wide. Never had this before. She has neuropathy but otherwise she feels like she could feel the ground ok. She also states "she couldn't get her hand to go where she wanted to". No noticeable weakness of left hand, just uncoordinated movements. No numbness of face. No change in speech. Since she arrived to the ED, she states she is "somewhat improved."   She sees Dr. Cleta Alberts for PCP. Also followed by Cardiology on a regular basis as an outpatient. She is on Coumadin for her A.fib and has a subtherapeutic INR of 1.71 today. (Takes 5mg  5 days, 2mg  M and F.) Has not missed any doses of her medications. Patient lives alone and takes care of herself with no problems. She doses her own mediations. States she cooks and cleans for herself. On review of systems, patient complains she is very constipated; takes laxative. (Not a new problem) She also complains of increasing leg edema. Cardiologist called in an extra diuretic 2 days ago which helped some, but she is unsure what that medication was.   ED Course: Patient brought in by EMS. CT head did not show acute process. Patient was seen by neurology who made recommendations about patient's care, but did not place orders. Family Medicine called for admission.   Past Medical History  Diagnosis Date  . Pericardial effusion     Chronic. Last echo in April  of 2012.   . Atrial fibrillation     Chronic  . Chronic anticoagulation     on Coumadin  . History of GI bleed   . HTN (hypertension)   . Sleep apnea   . Pacemaker     placed for tachybrady.   . Valvular heart disease   . Obesity   . LVH (left ventricular hypertrophy)     Past Surgical History  Procedure Date  . Pacemaker insertion     Last generator in 2006  . Breast lumpectomy     bilaterally  . Tonsillectomy   . Adenoidectomy   Gallbladder  Family History  Problem Relation Age of Onset  . Stroke Mother   . Stroke Father   . Pneumonia Father    Social History:  reports that she has never smoked. She has never used smokeless tobacco. She reports that she does not drink alcohol or use illicit drugs. Lives alone, no problems at home. No assistance at home.   Allergies: No Known Allergies  Medications Prior to Admission  Medication Dose Route Frequency Provider Last Rate Last Dose  . aspirin chewable tablet 324 mg  324 mg Oral Once Thomasene Lot, PA-C   324 mg at 05/16/11 1145   Medications Prior to Admission  Medication Sig Dispense Refill  . digoxin (LANOXIN) 0.125 MG tablet Take 125 mcg by mouth 2 (two) times daily. Taking one tablet in the morning and a  one of a tablet in the evening      . diphenhydrAMINE (BENADRYL) 25 MG tablet Take 25 mg by mouth every 6 (six) hours as needed. Sleep      . multivitamin (THERAGRAN) per tablet Take 1 tablet by mouth daily.        Marland Kitchen warfarin (COUMADIN) 5 MG tablet Take 2.5-5 mg by mouth. Currently taking 5mg  5 days a week. and 2.5mg  on monday and Friday.      Marland Kitchen DISCONTD: digoxin (LANOXIN) 0.125 MG tablet Take 1 tablet (125 mcg total) by mouth daily. Taking one tablet in the morning and a one of a tablet in the evening  60 tablet  6  . torsemide (DEMADEX) 20 MG tablet Take 1 tablet (20 mg total) by mouth daily.  30 tablet  11  Verapamil ER 240mg  1 tab daily (AM)  Results for orders placed during the hospital encounter of 05/16/11  (from the past 48 hour(s))  DIGOXIN LEVEL     Status: Abnormal   Collection Time   05/16/11  9:42 AM      Component Value Range Comment   Digoxin Level 2.1 (*) 0.8 - 2.0 (ng/mL)   CBC     Status: Normal   Collection Time   05/16/11 10:00 AM      Component Value Range Comment   WBC 7.9  4.0 - 10.5 (K/uL)    RBC 4.52  3.87 - 5.11 (MIL/uL)    Hemoglobin 13.5  12.0 - 15.0 (g/dL)    HCT 16.1  09.6 - 04.5 (%)    MCV 89.4  78.0 - 100.0 (fL)    MCH 29.9  26.0 - 34.0 (pg)    MCHC 33.4  30.0 - 36.0 (g/dL)    RDW 40.9  81.1 - 91.4 (%)    Platelets 278  150 - 400 (K/uL)   DIFFERENTIAL     Status: Normal   Collection Time   05/16/11 10:00 AM      Component Value Range Comment   Neutrophils Relative 74  43 - 77 (%)    Neutro Abs 5.9  1.7 - 7.7 (K/uL)    Lymphocytes Relative 15  12 - 46 (%)    Lymphs Abs 1.2  0.7 - 4.0 (K/uL)    Monocytes Relative 10  3 - 12 (%)    Monocytes Absolute 0.8  0.1 - 1.0 (K/uL)    Eosinophils Relative 1  0 - 5 (%)    Eosinophils Absolute 0.1  0.0 - 0.7 (K/uL)    Basophils Relative 0  0 - 1 (%)    Basophils Absolute 0.0  0.0 - 0.1 (K/uL)   COMPREHENSIVE METABOLIC PANEL     Status: Abnormal   Collection Time   05/16/11 10:00 AM      Component Value Range Comment   Sodium 138  135 - 145 (mEq/L)    Potassium 3.0 (*) 3.5 - 5.1 (mEq/L)    Chloride 91 (*) 96 - 112 (mEq/L)    CO2 35 (*) 19 - 32 (mEq/L)    Glucose, Bld 141 (*) 70 - 99 (mg/dL)    BUN 16  6 - 23 (mg/dL)    Creatinine, Ser 7.82  0.50 - 1.10 (mg/dL)    Calcium 9.7  8.4 - 10.5 (mg/dL)    Total Protein 7.6  6.0 - 8.3 (g/dL)    Albumin 4.2  3.5 - 5.2 (g/dL)    AST 23  0 - 37 (U/L)    ALT 22  0 - 35 (U/L)    Alkaline Phosphatase 90  39 - 117 (U/L)    Total Bilirubin 1.1  0.3 - 1.2 (mg/dL)    GFR calc non Af Amer 53 (*) >90 (mL/min)    GFR calc Af Amer 61 (*) >90 (mL/min)   PROTIME-INR     Status: Abnormal   Collection Time   05/16/11 10:00 AM      Component Value Range Comment   Prothrombin Time 20.4  (*) 11.6 - 15.2 (seconds)    INR 1.71 (*) 0.00 - 1.49    POCT I-STAT TROPONIN I     Status: Normal   Collection Time   05/16/11 10:13 AM      Component Value Range Comment   Troponin i, poc 0.02  0.00 - 0.08 (ng/mL)    Comment 3            URINALYSIS, ROUTINE W REFLEX MICROSCOPIC     Status: Abnormal   Collection Time   05/16/11 10:47 AM      Component Value Range Comment   Color, Urine YELLOW  YELLOW     APPearance CLEAR  CLEAR     Specific Gravity, Urine 1.009  1.005 - 1.030     pH 7.0  5.0 - 8.0     Glucose, UA NEGATIVE  NEGATIVE (mg/dL)    Hgb urine dipstick MODERATE (*) NEGATIVE     Bilirubin Urine NEGATIVE  NEGATIVE     Ketones, ur NEGATIVE  NEGATIVE (mg/dL)    Protein, ur NEGATIVE  NEGATIVE (mg/dL)    Urobilinogen, UA 1.0  0.0 - 1.0 (mg/dL)    Nitrite NEGATIVE  NEGATIVE     Leukocytes, UA NEGATIVE  NEGATIVE    URINE MICROSCOPIC-ADD ON     Status: Abnormal   Collection Time   05/16/11 10:47 AM      Component Value Range Comment   Squamous Epithelial / LPF RARE  RARE     RBC / HPF 0-2  <3 (RBC/hpf)    Casts HYALINE CASTS (*) NEGATIVE     Ct Head Wo Contrast  05/16/2011  *RADIOLOGY REPORT*  Clinical Data: 78 year old female with left lower extremity weakness.  Atrial fibrillation.  CT HEAD WITHOUT CONTRAST  Technique:  Contiguous axial images were obtained from the base of the skull through the vertex without contrast.  Comparison: None.  Findings: Visualized paranasal sinuses and mastoids are clear.  No acute osseous abnormality identified.  Postoperative changes to the globes.  Negative scalp soft tissues.  Calcified atherosclerosis at the skull base.  Cerebral volume is within normal limits for age.  No ventriculomegaly. No midline shift, mass effect, or evidence of mass lesion.  No acute intracranial hemorrhage identified.  No evidence of cortically based acute infarction identified.  Mild to moderate for age cerebral white matter hypodensity, primarily around the frontal horns.  No suspicious intracranial vascular hyperdensity.  IMPRESSION: No acute intracranial abnormality.  Mild to moderate for age nonspecific white matter changes.  Original Report Authenticated By: Harley Hallmark, M.D.    Review of Systems  Constitutional: Negative for fever and chills.  Respiratory: Negative for shortness of breath.   Cardiovascular: Positive for palpitations and leg swelling. Negative for chest pain.  Gastrointestinal: Positive for constipation. Negative for nausea and vomiting.  Genitourinary: Negative for dysuria.  Skin: Negative for rash.  Neurological: Negative for dizziness and headaches.    Blood pressure 114/59, pulse 60, temperature 98.2 F (36.8 C), temperature source Oral, resp. rate 14,  SpO2 93.00%. Physical Exam  Constitutional: She is oriented to person, place, and time. She appears well-developed and well-nourished. No distress.       Very pleasant. Talkative. Answers questions appropriately.   HENT:  Head: Normocephalic and atraumatic.       Center in place on 2L  Eyes: EOM are normal. Pupils are equal, round, and reactive to light. Right eye exhibits no discharge. Left eye exhibits no discharge.  Neck: Normal range of motion. Thyromegaly (questionable on left?) present.  Cardiovascular: Normal rate and normal pulses.  An irregularly irregular rhythm present.  Murmur heard.  Systolic murmur is present with a grade of 2/6  Respiratory: Effort normal and breath sounds normal. She has no wheezes.  GI: Soft. She exhibits no distension. There is no tenderness.       Firmness noted in LLQ, nontender.  Lymphadenopathy:    She has no cervical adenopathy.  Neurological: She is alert and oriented to person, place, and time. A sensory deficit (Left lower extremity sensory changes compared to left. ?chronic vs acute) is present. No cranial nerve deficit.       Patient with weakness of left hip flexors compared to right. Able to bend knee and ankle. Some questionable  weakness of left shoulder compared to right, but strength equal in hands bilaterally. No cerebellar deficits. Speech normal, no CN deficits appreciated.  Skin: Skin is warm and dry.       Chronic venous changes of feet bilaterally      Assessment/Plan [name] with acute onset LLE weakness  1. Weakness- Acute onset left lower extremity weakness when patient woke up this morning. Progressively worse throughout morning, but improving since arrival to ED. Neurology was consulted and saw patient; made recommendations. Patient does have risk factors including atrial fib, subtherapeutic INR, family history, hypertension. - Admit to [facility] Medicine Teaching Service. Telemetry monitoring; attending Dr. Deirdre Priest - Neurology consulted and will continue to follow patient - Will do Echo (Last echo in April 2012) - Labs to include: A1C, fasting lipid panel, TSH, and AM labs - Will get carotid dopplers - PT/OT/SLP consults - Takes Coumadin with subtherapeutic INR. Pharmacy to dose - Neuro checks q4 - Continue to monitor  2. Afib- Known history of atrial fibrillation. Followed by [facility] cardiology. Last echo was in April 2012. She does have a pacemaker as well - Rate controlled with Dig and Verapamil. Continue these medications at home doses  3. HTN- BP stable. - Continue home medications - Patient with led edema. Continue home Torsemide. Unsure what her "extra diuretic" from the cardiologist was. If she has increased edema, will consider switching to IV or trying a different diuretic, but at this time, no signs of fluid overload - Continue to monitor  4. Constipation- Patient with history of chronic constipation. Last bowel movement a few days ago. Has tried laxatives at home with no relief - Will give Miralax daily - Patient may have Ducolax suppository prn for constipation - If she develops any pain, or does not have bowel movement, will consider KUB  FEN/GI- Heart healthy diet after  patient passes bedside swallow evaluation. IVF to saline lock. Miralax daily, and Ducolax prn for constipation. PPx- On Coumadin. No VTE ppx indicated Dispo- Pending further work up and clinical improvement  HAIRFORD, AMBER 05/16/2011, 3:31 PM  PGY-2 Addendum  I have seen and examined patient along with Dr. Mikel Cella.  I agree with her exam, assessment and plan. As above will risk stratify and  make risk factor modifications as needed.  Current know risk factors include a.fib, htn.  Unable to MRI 2/2 to pacemaker.  Plan for carotid dopplers, echo.  Complaint of constipation, however no tenderness or distention, no nausea. For now will treat with laxative and suppository  If worsens can consider abd flat plate to evaluate for obstruction.  Everrett Coombe, DO

## 2011-05-16 NOTE — ED Notes (Signed)
Receiving rn unable to take report, he is in pharmacy, attempt x2

## 2011-05-16 NOTE — Progress Notes (Signed)
Notified of extra pacer spikes on telemetry.  Pt HR 60's on monitor.  EKG obtained showing ventricular pacing without additional pacer spikes while patient had extra pacer spikes on telemetry monitor.  Pt. Stated she felt like she has "a heavy head." but no additional symptoms or complaints.  BP 135/63.  MD notified.  MD coming to floor to see patient.  Will continue to monitor.

## 2011-05-16 NOTE — ED Notes (Signed)
Family at bedside. 

## 2011-05-16 NOTE — ED Provider Notes (Signed)
Medical screening examination/treatment/procedure(s) were conducted as a shared visit with non-physician practitioner(s) and myself.  I personally evaluated the patient during the encounter  H/o Afib on coumadin pw LLE weakness, also c/o "my left arm doesn't quite do what I want it to do". 2-3/5 strength LLE. Systolic murmur. EKG V paced. CT head wnl. Cannot obtain MRI 2/2 pacemaker. Neurology has seen and evaluated. Probably stroke out of window for tpA given that she awoke with the symptoms at 0400AM.   Forbes Cellar, MD 05/16/11 [phone]

## 2011-05-16 NOTE — ED Provider Notes (Signed)
History     CSN: [id number]  Arrival date & time 05/16/11  4098   First MD Initiated Contact with Patient 05/16/11 0936     10:00 AM HPI Patient reports waking up this morning with left lower extremity weakness. States she called EMS but decided not to come with them to the emergency department. Reports she went back to sleep and when she woke she had increased weakness of her left lower extremity. Describes the sensation as her extremity feeling heavy. Denies pain, paresthesias, headache, aphasia, chest pain, shortness of breath, back pain, nausea, vomiting. Reports difficulty with walking.Family reports they found her like this at 8:30 and called EMS  Patient is a 78 y.o. female presenting with weakness. The history is provided by the patient.  Weakness The primary symptoms include focal weakness. Primary symptoms do not include headaches, syncope, loss of consciousness, altered mental status, seizures, dizziness, visual change, paresthesias, loss of sensation, speech change, memory loss, fever, nausea or vomiting. The symptoms began 6 to 12 hours ago. The symptoms are worsening. The neurological symptoms are focal.  Additional symptoms include weakness. Additional symptoms do not include pain, lower back pain, leg pain, photophobia, hallucinations, nystagmus or vertigo. Medical issues do not include cerebral vascular accident or diabetes.    Past Medical History  Diagnosis Date  . Pericardial effusion     Chronic. Last echo in April of 2012.   . Atrial fibrillation     Chronic  . Chronic anticoagulation     on Coumadin  . History of GI bleed   . HTN (hypertension)   . Sleep apnea   . Pacemaker     placed for tachybrady.   . Valvular heart disease   . Obesity   . LVH (left ventricular hypertrophy)     Past Surgical History  Procedure Date  . Pacemaker insertion     Last generator in 2006  . Breast lumpectomy     bilaterally  . Tonsillectomy   . Adenoidectomy     Family  History  Problem Relation Age of Onset  . Stroke Mother   . Stroke Father   . Pneumonia Father     History  Substance Use Topics  . Smoking status: Never Smoker   . Smokeless tobacco: Never Used  . Alcohol Use: No    OB History    Grav Para Term Preterm Abortions TAB SAB Ect Mult Living                  Review of Systems  Constitutional: Negative for fever.  Eyes: Negative for photophobia.  Cardiovascular: Negative for syncope.  Gastrointestinal: Negative for nausea and vomiting.  Neurological: Positive for focal weakness and weakness. Negative for dizziness, vertigo, speech change, seizures, loss of consciousness, headaches and paresthesias.  Psychiatric/Behavioral: Negative for hallucinations, memory loss and altered mental status.    Allergies  Review of patient's allergies indicates no known allergies.  Home Medications   Current Outpatient Rx  Name Route Sig Dispense Refill  . DIGOXIN 0.125 MG PO TABS Oral Take 125 mcg by mouth 2 (two) times daily. Taking one tablet in the morning and a one of a tablet in the evening    . DIPHENHYDRAMINE HCL 25 MG PO TABS Oral Take 25 mg by mouth every 6 (six) hours as needed. Sleep    . MULTIVITAMINS PO TABS Oral Take 1 tablet by mouth daily.      Marland Kitchen VERAPAMIL HCL ER 240 MG PO TBCR Oral Take 240  mg by mouth at bedtime.    . WARFARIN SODIUM 5 MG PO TABS Oral Take 2.5-5 mg by mouth. Currently taking 5mg  5 days a week. and 2.5mg  on monday and Friday.    . TORSEMIDE 20 MG PO TABS Oral Take 1 tablet (20 mg total) by mouth daily. 30 tablet 11    BP 126/73  Pulse 76  Temp(Src) 98.7 F (37.1 C) (Oral)  Resp 21  SpO2 92%  Physical Exam  Vitals reviewed. Constitutional: She is oriented to person, place, and time. Vital signs are normal. She appears well-developed and well-nourished.  HENT:  Head: Normocephalic and atraumatic.  Eyes: Conjunctivae are normal. Pupils are equal, round, and reactive to light.  Neck: Normal range of  motion. Neck supple.  Cardiovascular: Normal rate, regular rhythm and normal heart sounds.  Exam reveals no friction rub.   No murmur heard. Pulmonary/Chest: Effort normal and breath sounds normal. She has no wheezes. She has no rhonchi. She has no rales. She exhibits no tenderness.  Abdominal: Soft. Bowel sounds are normal.  Musculoskeletal: Normal range of motion.  Neurological: She is alert and oriented to person, place, and time. Coordination normal.  Skin: Skin is warm and dry. No rash noted. No erythema. No pallor.    ED Course  Procedures  ED ECG REPORT   Date: 05/16/2011  EKG Time: 12:21 PM  Rate: 79  Rhythm: ventricular paced rhythm  Axis: left   Intervals:none  ST&T Change: none     Results for orders placed during the hospital encounter of 05/16/11  DIGOXIN LEVEL      Component Value Range   Digoxin Level 2.1 (*) 0.8 - 2.0 (ng/mL)  CBC      Component Value Range   WBC 7.9  4.0 - 10.5 (K/uL)   RBC 4.52  3.87 - 5.11 (MIL/uL)   Hemoglobin 13.5  12.0 - 15.0 (g/dL)   HCT 02.7  25.3 - 66.4 (%)   MCV 89.4  78.0 - 100.0 (fL)   MCH 29.9  26.0 - 34.0 (pg)   MCHC 33.4  30.0 - 36.0 (g/dL)   RDW 40.3  47.4 - 25.9 (%)   Platelets 278  150 - 400 (K/uL)  DIFFERENTIAL      Component Value Range   Neutrophils Relative 74  43 - 77 (%)   Neutro Abs 5.9  1.7 - 7.7 (K/uL)   Lymphocytes Relative 15  12 - 46 (%)   Lymphs Abs 1.2  0.7 - 4.0 (K/uL)   Monocytes Relative 10  3 - 12 (%)   Monocytes Absolute 0.8  0.1 - 1.0 (K/uL)   Eosinophils Relative 1  0 - 5 (%)   Eosinophils Absolute 0.1  0.0 - 0.7 (K/uL)   Basophils Relative 0  0 - 1 (%)   Basophils Absolute 0.0  0.0 - 0.1 (K/uL)  COMPREHENSIVE METABOLIC PANEL      Component Value Range   Sodium 138  135 - 145 (mEq/L)   Potassium 3.0 (*) 3.5 - 5.1 (mEq/L)   Chloride 91 (*) 96 - 112 (mEq/L)   CO2 35 (*) 19 - 32 (mEq/L)   Glucose, Bld 141 (*) 70 - 99 (mg/dL)   BUN 16  6 - 23 (mg/dL)   Creatinine, Ser 5.63  0.50 - 1.10  (mg/dL)   Calcium 9.7  8.4 - 87.5 (mg/dL)   Total Protein 7.6  6.0 - 8.3 (g/dL)   Albumin 4.2  3.5 - 5.2 (g/dL)   AST 23  0 - 37 (U/L)   ALT 22  0 - 35 (U/L)   Alkaline Phosphatase 90  39 - 117 (U/L)   Total Bilirubin 1.1  0.3 - 1.2 (mg/dL)   GFR calc non Af Amer 53 (*) >90 (mL/min)   GFR calc Af Amer 61 (*) >90 (mL/min)  URINALYSIS, ROUTINE W REFLEX MICROSCOPIC      Component Value Range   Color, Urine YELLOW  YELLOW    APPearance CLEAR  CLEAR    Specific Gravity, Urine 1.009  1.005 - 1.030    pH 7.0  5.0 - 8.0    Glucose, UA NEGATIVE  NEGATIVE (mg/dL)   Hgb urine dipstick MODERATE (*) NEGATIVE    Bilirubin Urine NEGATIVE  NEGATIVE    Ketones, ur NEGATIVE  NEGATIVE (mg/dL)   Protein, ur NEGATIVE  NEGATIVE (mg/dL)   Urobilinogen, UA 1.0  0.0 - 1.0 (mg/dL)   Nitrite NEGATIVE  NEGATIVE    Leukocytes, UA NEGATIVE  NEGATIVE   PROTIME-INR      Component Value Range   Prothrombin Time 20.4 (*) 11.6 - 15.2 (seconds)   INR 1.71 (*) 0.00 - 1.49   POCT I-STAT TROPONIN I      Component Value Range   Troponin i, poc 0.02  0.00 - 0.08 (ng/mL)   Comment 3           URINE MICROSCOPIC-ADD ON      Component Value Range   Squamous Epithelial / LPF RARE  RARE    RBC / HPF 0-2  <3 (RBC/hpf)   Casts HYALINE CASTS (*) NEGATIVE    Ct Head Wo Contrast  05/16/2011  *RADIOLOGY REPORT*  Clinical Data: 78 year old female with left lower extremity weakness.  Atrial fibrillation.  CT HEAD WITHOUT CONTRAST  Technique:  Contiguous axial images were obtained from the base of the skull through the vertex without contrast.  Comparison: None.  Findings: Visualized paranasal sinuses and mastoids are clear.  No acute osseous abnormality identified.  Postoperative changes to the globes.  Negative scalp soft tissues.  Calcified atherosclerosis at the skull base.  Cerebral volume is within normal limits for age.  No ventriculomegaly. No midline shift, mass effect, or evidence of mass lesion.  No acute intracranial  hemorrhage identified.  No evidence of cortically based acute infarction identified.  Mild to moderate for age cerebral white matter hypodensity, primarily around the frontal horns. No suspicious intracranial vascular hyperdensity.  IMPRESSION: No acute intracranial abnormality.  Mild to moderate for age nonspecific white matter changes.  Original Report Authenticated By: Harley Hallmark, M.D.     MDM   12:55 PM Suspect possible CVA. Patient is past 8hr Loraine Leriche since last seen normal by family on Thursday. CT scan is negative. With patient's history of atrial fibrillation and subtherapeutic Coumadin recommended admission to hospital for further evaluation of stroke. I've spoken with Dr. Roseanne Reno regarding this and he states he will come see the patient. Patient still continues to have left-sided heaviness of the left lower extremity but it seems to have improved slightly.     Thomasene Lot, PA-C 05/16/11 1257  3:25 PM Patient has been seen by neuro but is uncertain whether neuro is going to admit. I therefore tried to speak with the neurologist however Dr. Roseanne Reno sorry left today. I spoke with Dr. Lyman Speller and  he was uncertain whether  Dr. Roseanne Reno wanted patient admitted. I therefore decided to admit patient to family medicine and advised to family medicine and that Dr. Roseanne Reno had  a plan as written a note to not do to admission orders and requests. Patient is appropriate for internal medicine admission.  Thomasene Lot, PA-C 05/16/11 1526

## 2011-05-16 NOTE — Consult Note (Signed)
Chief Complaint: New-onset weakness involving left lower extremity along with coordination difficulty and left upper extremity.  HPI: [name] is an 78 y.o. female with a history of atrial fibrillation, hypertension, pacemaker placement and heart valvular disease, presenting with new onset left lower extremity weakness as well as coordination difficulty of left hand. Patient woke up with these symptoms around 4 AM today. She was last known normal at bedtime last night. She took a nap after waking up this morning and had worsening of her left lower extremity weakness when she woke up. Strength has improved slightly since she arrived in the emergency room. CT scan of the head showed no acute intracranial abnormality. MRI study could not be obtained because patient has pacemaker. NIH stroke score was 4. She is on Coumadin and has an INR of 1.71 today.  LSN: 10 PM 05/15/2011 tPA Given: No: Beyond time window for thrombolytic therapy MRankin: 1  Past Medical History  Diagnosis Date  . Pericardial effusion     Chronic. Last echo in April of 2012.   . Atrial fibrillation     Chronic  . Chronic anticoagulation     on Coumadin  . History of GI bleed   . HTN (hypertension)   . Sleep apnea   . Pacemaker     placed for tachybrady.   . Valvular heart disease   . Obesity   . LVH (left ventricular hypertrophy)     Family History  Problem Relation Age of Onset  . Stroke Mother   . Stroke Father   . Pneumonia Father      Medications: Prior to Admission:  Digoxin 0.125 mg twice a day Benadryl 25 mg at bedtime when necessary Multivitamin daily Calan SR 240 mg at bedtime Coumadin 2.5 mg on Mondays and Fridays and 5 mg all other days Demadex 20 mg per day  Physical Examination: Blood pressure 114/59, pulse 60, temperature 98.2 F (36.8 C), temperature source Oral, resp. rate 14, SpO2 93.00%.  Neurologic Examination: Mental Status: Alert, oriented, thought content appropriate.   Speech fluent without evidence of aphasia. Able to follow commands without difficulty. Cranial Nerves: II-Visual fields were normal. III/IV/VI-Pupils were equal and reacted. Extraocular movements were full and conjugate.    V/VII-no facial numbness and no facial weakness. VIII-normal. X-normal speech and symmetrical palatal movement. XII-midline tongue extension Motor: 2/5 strength of proximal left lower extremity, and 4/5 strength distally; normal strength otherwise.  Sensory: Normal throughout. Deep Tendon Reflexes: 1+ in the upper extremities and absent in the lower extremities. Plantars: Left Babinski sign Cerebellar: Slight difficulty with finger-to-nose testing on the left. Carotid auscultation: Transmitted systolic murmur to both carotids from the aortic area.  Ct Head Wo Contrast  05/16/2011  *RADIOLOGY REPORT*  Clinical Data: 78 year old female with left lower extremity weakness.  Atrial fibrillation.  CT HEAD WITHOUT CONTRAST  Technique:  Contiguous axial images were obtained from the base of the skull through the vertex without contrast.  Comparison: None.  Findings: Visualized paranasal sinuses and mastoids are clear.  No acute osseous abnormality identified.  Postoperative changes to the globes.  Negative scalp soft tissues.  Calcified atherosclerosis at the skull base.  Cerebral volume is within normal limits for age.  No ventriculomegaly. No midline shift, mass effect, or evidence of mass lesion.  No acute intracranial hemorrhage identified.  No evidence of cortically based acute infarction identified.  Mild to moderate for age cerebral white matter hypodensity, primarily around the frontal horns. No suspicious intracranial vascular hyperdensity.  IMPRESSION: No acute intracranial abnormality.  Mild to moderate for age nonspecific white matter changes.  Original Report Authenticated By: Harley Hallmark, M.D.    Assessment: 78 y.o. 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  Stroke Risk Factors - atrial fibrillation, family history and hypertension  Plan: 1. HgbA1c, fasting lipid panel 2. PT consult, OT consult, Speech consult 3. Echocardiogram 4. Carotid dopplers 5. Prophylactic therapy-Anticoagulation: Coumadin- pharmacy to manage 6. Risk factor modification 7. Telemetry monitoring  C.R. Roseanne Reno, MD [facility]  05/16/2011, 1:52 PM

## 2011-05-16 NOTE — ED Notes (Signed)
Patient denies pain and is resting comfortably.  

## 2011-05-16 NOTE — Progress Notes (Signed)
ANTICOAGULATION CONSULT NOTE - Initial Consult  Pharmacy Consult for Warfarin Indication: atrial fibrillation  No Known Allergies  Patient Measurements: Height: 5\' 5"  (165.1 cm) Weight: 158 lb 11.2 oz (71.986 kg) IBW/kg (Calculated) : 57   Vital Signs: Temp: 98.6 F (37 C) (04/20 1730) Temp src: Oral (04/20 1730) BP: 122/70 mmHg (04/20 1730) Pulse Rate: 83  (04/20 1730)  Labs:  Basename 05/16/11 1000  HGB 13.5  HCT 40.4  PLT 278  APTT --  LABPROT 20.4*  INR 1.71*  HEPARINUNFRC --  CREATININE 0.95  CKTOTAL --  CKMB --  TROPONINI --   Estimated Creatinine Clearance: 42.3 ml/min (by C-G formula based on Cr of 0.95).  Medical History: Past Medical History  Diagnosis Date  . Pericardial effusion     Chronic. Last echo in April of 2012.   . Atrial fibrillation     Chronic  . Chronic anticoagulation     on Coumadin  . History of GI bleed   . HTN (hypertension)   . Sleep apnea   . Pacemaker     placed for tachybrady.   . Valvular heart disease   . Obesity   . LVH (left ventricular hypertrophy)     Assessment: 78 y.o. F resumed on warfarin from PTA for hx Afib. The patient was admitted with a SUPRAtherapeutic INR this evening (INR 1.71, goal of 1.8-2). CBC ok, no bleeding noted. The patient's home dose is 5 mg daily EXCEPT for 2.5 mg on Mondays and Fridays. The patient's last dose was 2.5 mg on 4/19.   Goal of Therapy:  INR 1.8-2 as specified by coumadin clinic notes   Plan:  1. Warfarin 5 mg x 1 dose at 1800 today 2. Daily PT/INR 3. Will continue to monitor for any signs/symptoms of bleeding and will follow up with PT/INR in the a.m.   Georgina Pillion, [name], BCPS Clinical Pharmacist Pager: [phone] 05/16/2011 6:11 PM

## 2011-05-16 NOTE — ED Notes (Signed)
Patient transported to CT 

## 2011-05-16 NOTE — ED Notes (Signed)
Pt reports waking 0430 this am w/a (L) side "heavy" feeling, reports felt like she was having to "drag" her (L) foot while walking to the bathroom this am. Pt's grandson came over at 0800 noticed paper was still in the drive way and knew that was unusual since pt normally is up and about 0530 every am, grandson is a EMT and called 911 after he completed a neuro exam of his own. Reports pt was unable to hold her (L) leg in the air or able to lift her (:) leg from the bed. Pt LSN Thursday at noon. No facial droop, slurred words, or arm drift, (L) leg weakness noted. Pt able to ambulate but is unable to lift (L) leg, pt drags her (L) leg while ambulating.

## 2011-05-16 NOTE — ED Notes (Signed)
Pt attempted to use bedpan to void without success.

## 2011-05-16 NOTE — ED Notes (Signed)
Per EMS, pt from home, pt reports waking 0430 this am with (L) sided "heaviness," called EMS approx 0530, pt refused transport after ems assessed her, pt went back to sleep waking at 0800 called 911 d/t increase (L) side "heaviness." Pt ambulated to stretcher w/grandson assistance, no neuro deficits noted, no facial droop, speech clear.

## 2011-05-16 NOTE — ED Notes (Signed)
This rn attempted to give report to charge rn, charge rn unavailable, she is otf to security

## 2011-05-16 NOTE — ED Notes (Signed)
Receiving rn unable to take report. 

## 2011-05-16 NOTE — Progress Notes (Signed)
RN called to notify MD of extra pacer spikes on telemetry.  An EKG does NOT show the extra spikes.  Patient reports feeling okay, except for a "heavy head" when standing.  Denies dizziness or feeling like she is going to pass out.  Has not been taking much in the way of fluids and states she feels dry.  Reports one void this afternoon.  Continued constipation.  Asking for something to help her sleep.  O: vital signs normal (P 60s, BP 120s/70s) Neuro: unchanged from admission exam - alert, oriented, LLE weakness, LUE is shaky but no cerebellar signs  A/P: [name] with A fib, HTN, s/p pacemaker here for stroke work up for new left sided weakness - will give 500cc bolus - as EKG shows normal pacer activity, will monitor for now - miralax tonight for constipation - will give Trazadone for sleep if fluids resolve "heavy head" feeling  [name], [name] Doerner 05/16/2011, 10:46 PM

## 2011-05-17 DIAGNOSIS — I359 Nonrheumatic aortic valve disorder, unspecified: Secondary | ICD-10-CM

## 2011-05-17 DIAGNOSIS — K921 Melena: Secondary | ICD-10-CM

## 2011-05-17 DIAGNOSIS — I4891 Unspecified atrial fibrillation: Secondary | ICD-10-CM

## 2011-05-17 DIAGNOSIS — I6350 Cerebral infarction due to unspecified occlusion or stenosis of unspecified cerebral artery: Secondary | ICD-10-CM

## 2011-05-17 DIAGNOSIS — R42 Dizziness and giddiness: Secondary | ICD-10-CM

## 2011-05-17 LAB — CBC
Hemoglobin: 12 g/dL (ref 12.0–15.0)
MCV: 90.2 fL (ref 78.0–100.0)
Platelets: 274 10*3/uL (ref 150–400)
RBC: 4.1 MIL/uL (ref 3.87–5.11)
WBC: 7.7 10*3/uL (ref 4.0–10.5)

## 2011-05-17 LAB — LIPID PANEL
Cholesterol: 141 mg/dL (ref 0–200)
Total CHOL/HDL Ratio: 4.7 RATIO
Triglycerides: 107 mg/dL (ref <150)

## 2011-05-17 LAB — URINE CULTURE
Colony Count: 65000
Culture  Setup Time: 201304202153

## 2011-05-17 LAB — TSH: TSH: 2.471 u[IU]/mL (ref 0.350–4.500)

## 2011-05-17 LAB — COMPREHENSIVE METABOLIC PANEL
Albumin: 3.6 g/dL (ref 3.5–5.2)
BUN: 18 mg/dL (ref 6–23)
Calcium: 9 mg/dL (ref 8.4–10.5)
Chloride: 96 mEq/L (ref 96–112)
Creatinine, Ser: 0.91 mg/dL (ref 0.50–1.10)
Total Bilirubin: 0.8 mg/dL (ref 0.3–1.2)

## 2011-05-17 LAB — PROTIME-INR: INR: 1.73 — ABNORMAL HIGH (ref 0.00–1.49)

## 2011-05-17 MED ORDER — TRAZODONE HCL 50 MG PO TABS
50.0000 mg | ORAL_TABLET | Freq: Every evening | ORAL | Status: DC | PRN
  Filled 2011-05-17: qty 1

## 2011-05-17 MED ORDER — WARFARIN SODIUM 7.5 MG PO TABS
7.5000 mg | ORAL_TABLET | Freq: Once | ORAL | Status: AC
  Administered 2011-05-17: 7.5 mg via ORAL
  Filled 2011-05-17: qty 1

## 2011-05-17 MED ORDER — WARFARIN SODIUM 5 MG PO TABS
5.0000 mg | ORAL_TABLET | Freq: Once | ORAL | Status: DC
  Filled 2011-05-17: qty 1

## 2011-05-17 MED ORDER — TRAZODONE HCL 50 MG PO TABS
50.0000 mg | ORAL_TABLET | Freq: Every evening | ORAL | Status: DC | PRN
  Administered 2011-05-17: 50 mg via ORAL
  Filled 2011-05-17: qty 1

## 2011-05-17 NOTE — Progress Notes (Signed)
History: [name] is an 78 y.o. female with a history of atrial fibrillation, hypertension, pacemaker placement and heart valvular disease, presenting with new onset left lower extremity weakness as well as coordination difficulty of left hand. Patient woke up with these symptoms around 4 AM today. She was last known normal at bedtime last night. She took a nap after waking up this morning and had worsening of her left lower extremity weakness when she woke up. Strength has improved slightly since she arrived in the emergency room. CT scan of the head showed no acute intracranial abnormality. MRI study could not be obtained because patient has pacemaker. NIH stroke score was 4. She is on Coumadin and has an INR of 1.71 today.  LSN: 10 PM 05/15/2011  tPA Given: No: Beyond time window for thrombolytic therapy  MRankin: 1 Interval: Baseline (04/20 1032) Level of Consciousness (1a. ): Alert, keenly responsive (04/20 2000) LOC Questions (1b. ): Answers both questions correctly (04/20 2000) LOC Commands (1c. ): Performs both tasks correctly (04/20 2000) Best Gaze (2. ): Normal (04/20 2000) Visual (3. ): No visual loss (04/20 2000) Facial Palsy (4. ): Normal symmetrical movements (04/20 2000) Motor Arm, Left (5a. ): No drift (04/20 2000) Motor Arm, Right (5b. ): No drift (04/20 2000) Motor Leg, Left (6a. ): Some effort against gravity (04/20 2000) Motor Leg, Right (6b. ): No drift (04/20 2000) Limb Ataxia (7. ): Absent (04/20 2000) Sensory (8. ): Mild-to-moderate sensory loss, patient feels pinprick is less sharp or is dull on the affected side, or there is a loss of superficial pain with pinprick, but patient is aware of being touched (04/20 2000) Best Language (9. ): No aphasia (04/20 2000) Dysarthria (10. ): Normal (04/20 2000) Inattention/Extinction: No Abnormality (04/20 2000) Total: 3  (04/20 2000)  Subjective: Able to move left leg a little better.  More coordination of left  UE.  Objective: BP 126/66  Pulse 68  Temp(Src) 98.3 F (36.8 C) (Oral)  Resp 20  Ht 5\' 5"  (1.651 m)  Wt 71.986 kg (158 lb 11.2 oz)  BMI 26.41 kg/m2  SpO2 95% Telemetry:  Diet: Heart healthy, thin  Activity: up with assistance  DVT Prophylaxis: coumadin  Medications: Scheduled:   . aspirin  324 mg Oral Once  . polyethylene glycol  17 g Oral Daily  . polyethylene glycol  17 g Oral Once  . potassium chloride  40 mEq Oral TID  . sodium chloride  500 mL Intravenous Once  . sodium chloride  3 mL Intravenous Q12H  . torsemide  20 mg Oral Daily  . verapamil  240 mg Oral QHS  . warfarin  5 mg Oral ONCE-1800  . Warfarin - Pharmacist Dosing Inpatient   Does not apply q1800  . DISCONTD: digoxin  125 mcg Oral BID  . DISCONTD: sodium chloride  3 mL Intravenous Q12H   Neurologic Exam: Mental Status: Mental Status:  Alert, oriented, thought content appropriate. Speech fluent without evidence of aphasia. Able to follow commands without difficulty.  Cranial Nerves:  II-Visual fields were normal.  III/IV/VI-Pupils were equal and reacted. Extraocular movements were full and conjugate.  V/VII-no facial numbness and no facial weakness.  VIII-normal.  X-normal speech and symmetrical palatal movement.  XII-midline tongue extension  Motor: 3-4/5 strength of proximal left lower extremity, and 4/5 strength distally; normal strength otherwise.  LUE drift. Sensory: slightly altered on left hemibody.  Deep Tendon Reflexes: 1+ in the upper extremities and absent in the lower extremities.  Plantars: bilateral  Babinski sign  Cerebellar: Slight difficulty with finger-to-nose testing on the left.  Carotid auscultation: Transmitted systolic murmur to both carotids from the aortic area.  Lab Results: Basic Metabolic Panel:  Lab 05/16/11 1610  NA 138  K 3.0*  CL 91*  CO2 35*  GLUCOSE 141*  BUN 16  CREATININE 0.95  CALCIUM 9.7  MG --  PHOS --   Liver Function Tests:  Lab 05/16/11 1000   AST 23  ALT 22  ALKPHOS 90  BILITOT 1.1  PROT 7.6  ALBUMIN 4.2   CBC:  Lab 05/16/11 1000  WBC 7.9  NEUTROABS 5.9  HGB 13.5  HCT 40.4  MCV 89.4  PLT 278   Hemoglobin A1C:  Lab 05/16/11 1622  HGBA1C 6.3*   Thyroid Function Tests:  Lab 05/16/11 1810  TSH 2.471  T4TOTAL --  FREET4 --  T3FREE --  THYROIDAB --   Coagulation:  Lab 05/16/11 1000  LABPROT 20.4*  INR 1.71*   Urinalysis:  Lab 05/16/11 1047  COLORURINE YELLOW  LABSPEC 1.009  PHURINE 7.0  GLUCOSEU NEGATIVE  HGBUR MODERATE*  BILIRUBINUR NEGATIVE  KETONESUR NEGATIVE  PROTEINUR NEGATIVE  UROBILINOGEN 1.0  NITRITE NEGATIVE  LEUKOCYTESUR NEGATIVE   Study Results:  05/16/2011  CT HEAD WITHOUT CONTRAST   Findings: Visualized paranasal sinuses and mastoids are clear.  No acute osseous abnormality identified.  Postoperative changes to the globes.  Negative scalp soft tissues.  Calcified atherosclerosis at the skull base.  Cerebral volume is within normal limits for age.  No ventriculomegaly. No midline shift, mass effect, or evidence of mass lesion.  No acute intracranial hemorrhage identified.  No evidence of cortically based acute infarction identified.  Mild to moderate for age cerebral white matter hypodensity, primarily around the frontal horns. No suspicious intracranial vascular hyperdensity.  IMPRESSION: No acute intracranial abnormality.  Mild to moderate for age nonspecific white matter changes.  : H.LEE HALL III, M.D.   Therapies: pending  Assessment/Plan:  78 y.o. female with probable subcortical small vessel right cerebral infarction. On coumadin therapy chronically for Afib and valvular heart disease. Residual left sided weakness and difficulty with coordination.  Gait affected due to leg weakness.  Afib- on coumadin; INR subtherapeutic on admit 1.7 HTN- controlled A1C - 6.3- borderline DM  Stroke Risk Factors - atrial fibrillation, family history and hypertension   Plan:  1. fasting lipid  panel  2. PT consult, OT consult, Speech consult  3. Echocardiogram  4. Carotid dopplers  5. Prophylactic therapy-Anticoagulation: Coumadin- pharmacy to manage  6. Risk factor modification - appears to be developing borderline DM, consider nutrition education and OP follow up. 7. Telemetry monitoring 8. Repeat head CT in am  Patient seen and examined by Dr. Terrace Arabia   LOS: 1 day   Marya Fossa PA-C [facility] [phone] 05/17/2011  9:46 AM

## 2011-05-17 NOTE — Progress Notes (Signed)
  Echocardiogram 2D Echocardiogram has been performed.  [name], Real Cons 05/17/2011, 4:08 PM

## 2011-05-17 NOTE — Progress Notes (Addendum)
ANTICOAGULATION CONSULT NOTE - Initial Consult  Pharmacy Consult for Warfarin Indication: atrial fibrillation  No Known Allergies  Patient Measurements: Height: 5\' 5"  (165.1 cm) Weight: 158 lb 11.2 oz (71.986 kg) IBW/kg (Calculated) : 57   Vital Signs: Temp: 98.3 F (36.8 C) (04/21 1312) Temp src: Oral (04/21 1312) BP: 117/56 mmHg (04/21 1312) Pulse Rate: 77  (04/21 1312)  Labs:  Basename 05/17/11 0920 05/16/11 1000  HGB 12.0 13.5  HCT 37.0 40.4  PLT 274 278  APTT -- --  LABPROT 20.6* 20.4*  INR 1.73* 1.71*  HEPARINUNFRC -- --  CREATININE 0.91 0.95  CKTOTAL -- --  CKMB -- --  TROPONINI -- --   Estimated Creatinine Clearance: 44.1 ml/min (by C-G formula based on Cr of 0.91).  Medical History: Past Medical History  Diagnosis Date  . Pericardial effusion     Chronic. Last echo in April of 2012.   . Atrial fibrillation     Chronic  . Chronic anticoagulation     on Coumadin  . History of GI bleed   . HTN (hypertension)   . Sleep apnea   . Pacemaker     placed for tachybrady.   . Valvular heart disease   . Obesity   . LVH (left ventricular hypertrophy)     Assessment: 78 y.o. F on warfarin resumed from PTA for hx Afib with a slight SUBtherapeutic INR this a.m from MD-specified low goal range (INR 1.73 << 1.71, goal of 1.8-2). Hgb/Hct slight drop, no s/sx of bleeding noted. The patient's home dose is 5 mg daily EXCEPT for 2.5 mg on Mondays and Fridays. The patient's last dose prior to admission was 2.5 mg on 4/19. The goal INR range was discussed with the primary team today to see if it needs to be higher (i.e. 2-2.5) given the patient's presentation this admission. The team stated they would discuss with the cardiologist and clarify on 4/22 -- they were ok with a higher warfarin dose today.   Of noting, the patient was admitted with high digoxin level of 2.1 ng/mL (toxic at >2 ng/mL) due to "self-titration" at home and likely related to unclear instructions on  medication history from office visits (states: "Take 1 tablet (125 mcg total) by mouth daily. Taking one tablet in the morning and a one of a tablet in the evening"). This is usually a once a day medication -- will let primary team follow up with correct dose with her cardiologist.  Goal of Therapy:  INR 1.8-2 as specified by coumadin clinic notes   Plan:  1. Warfarin 7.5 mg x 1 dose at 1800 today 2. Daily PT/INR 3. Will continue to monitor for any signs/symptoms of bleeding and will follow up with PT/INR in the a.m.   Georgina Pillion, [name], BCPS Clinical Pharmacist Pager: [phone] 05/17/2011 1:40 PM

## 2011-05-17 NOTE — Progress Notes (Signed)
See H&P note.

## 2011-05-17 NOTE — H&P (Signed)
Family Medicine Teaching Service Attending Note  I interviewed and examined patient [name] and reviewed their tests and x-rays.  I discussed with Dr. Ashley Royalty and reviewed their note for today.  I agree with their assessment and plan.     Additionally  Feeling better this AM but left leg buckles if she tries to walk CVA workup Warfarin to therapeutic INR  Discuss with cardioloby about dig dosing

## 2011-05-17 NOTE — Progress Notes (Signed)
Pt O2 sat 88% on RA.  Pt placed on 2 L O2 [place] with Oxygen saturation increasing to 96%.  Pt. Denies any shortness of breath or difficulty breathing.  MD notified.  Will continue to monitor.

## 2011-05-17 NOTE — Progress Notes (Signed)
VASCULAR LAB PRELIMINARY  PRELIMINARY  PRELIMINARY  PRELIMINARY  Carotid Dopplers completed.    Preliminary report:  No ICA stenosis.  Vertebral artery flow is antegrade.  Sherren Kerns Marquez, 05/17/2011, 12:22 PM

## 2011-05-17 NOTE — Progress Notes (Signed)
Physical Therapy Evaluation Patient Details Name: [name] MRN: [id number] DOB: [birth year] Today's Date: 05/17/2011 Time: 7829-5621 PT Time Calculation (min): 33 min  PT Assessment / Plan / Recommendation Clinical Impression  78 yo female admitted with CVA presents with decr functional mobility, L sided weakness; will benefit from acute PT to maximize independence and safety with mobility, amb, steps, in prep for transfer to rehab; Of note, pt was extremely independent prior to admission, and will likley progress well    PT Assessment  Patient needs continued PT services    Follow Up Recommendations  Inpatient Rehab    Equipment Recommendations  Defer to next venue    Frequency Min 4X/week    Precautions / Restrictions Precautions Precautions: Fall Restrictions Weight Bearing Restrictions: No   Pertinent Vitals/Pain VSS      Mobility  Bed Mobility Bed Mobility: Not assessed (Pt EOB upon arrival) Transfers Transfers: Sit to Stand;Stand to Sit Sit to Stand: 3: Mod assist;From bed;From chair/3-in-1;With armrests Stand to Sit: 4: Min assist;With armrests;To chair/3-in-1 Details for Transfer Assistance: Cues for hand placement and safety; close guard of L knee, knee block initially for stability as noted buckle initial stand; cues for full hip and knee ext for upright posture Ambulation/Gait Ambulation/Gait Assistance: 3: Mod assist Ambulation Distance (Feet): 70 Feet Assistive device: Rolling walker;1 person hand held assist Ambulation/Gait Assistance Details: Verbal and tactile cues for upright posture, full hip extension; cues to push L heel into floor during loading response to promote L stance stability; occasional L knee buckle and occasional L knee push back into genu recurvatum; mod cues for posture Gait Pattern: Decreased hip/knee flexion - left Gait velocity: slow Modified Rankin (Stroke Patients Only) Modified Rankin: Moderately severe disability          PT Goals Acute Rehab PT Goals PT Goal Formulation: With patient Time For Goal Achievement: 05/31/11 Potential to Achieve Goals: Good Pt will go Supine/Side to Sit: with supervision PT Goal: Supine/Side to Sit - Progress: Goal set today Pt will go Sit to Supine/Side: with supervision PT Goal: Sit to Supine/Side - Progress: Goal set today Pt will go Sit to Stand: with supervision PT Goal: Sit to Stand - Progress: Goal set today Pt will go Stand to Sit: with supervision PT Goal: Stand to Sit - Progress: Goal set today Pt will Transfer Bed to Chair/Chair to Bed: with supervision PT Transfer Goal: Bed to Chair/Chair to Bed - Progress: Goal set today Pt will Stand: with supervision;6 - 10 min;with unilateral upper extremity support;with no upper extremity support PT Goal: Stand - Progress: Goal set today Pt will Ambulate: >150 feet;with supervision;with least restrictive assistive device PT Goal: Ambulate - Progress: Goal set today Pt will Go Up / Down Stairs: 6-9 stairs;with min assist;with least restrictive assistive device;with rail(s) PT Goal: Up/Down Stairs - Progress: Goal set today  Visit Information  Last PT Received On: 05/17/11    Subjective Data  Subjective: Pt indicated she wasn't sure if she could take 2 steps Patient Stated Goal: to be independent; ans, specifically, she wants to be able to walk to the bathroom without help   Prior Functioning  Home Living Lives With: Alone Available Help at Discharge: Family;Available 24 hours/day (daughter and son-in-law can arrange 24 hour assist) Type of Home: House Home Access: Stairs to enter [employer] of Steps: 2 Entrance Stairs-Rails: Right Home Layout: Two level;Bed/bath upstairs;Full bath on main level Alternate Level Stairs-Number of Steps: 12 Alternate Level Stairs-Rails: Right Bathroom Shower/Tub: Tub/shower  unit;Curtain Bathroom Toilet: Pharmacist, community: Yes How Accessible: Accessible  via walker Home Adaptive Equipment: None (Gave away her BSC) Additional Comments: Pt's own house is 78 years old; Pt may go to daughter's home; will need specifics of daughter's home Prior Function Level of Independence: Independent Able to Take Stairs?: Yes Driving: Yes Vocation: Retired Comments: Extremely independent prior to admission; occasionally went up and down stairs for exercise Communication Communication: HOH;Receptive difficulties (Requested this therapist speak more slowly)    Cognition  Overall Cognitive Status: Appears within functional limits for tasks assessed/performed Arousal/Alertness: Awake/alert Orientation Level: Oriented X4 / Intact Behavior During Session: [facility] for tasks performed    Extremity/Trunk Assessment Right Upper Extremity Assessment RUE ROM/Strength/Tone: Within functional levels Left Upper Extremity Assessment LUE ROM/Strength/Tone: Deficits LUE ROM/Strength/Tone Deficits: Noted weakness, some drift down when holding arm forward; will defer more specific assessment to OT Right Lower Extremity Assessment RLE ROM/Strength/Tone: Within functional levels Left Lower Extremity Assessment LLE ROM/Strength/Tone: Deficits LLE ROM/Strength/Tone Deficits: Hip flexors 2/5; Knee Ext. 2+/5; Ankle dorsiflexion 3+/5; Decr coordination with stepping LLE Coordination: Deficits LLE Coordination Deficits: Decr coordination with stepping; tending to overshoot Trunk Assessment Trunk Assessment: Normal   Balance    End of Session PT - End of Session Equipment Utilized During Treatment: Gait belt Activity Tolerance: Patient tolerated treatment well Patient left: in chair;with call bell/phone within reach;with family/visitor present Nurse Communication: Mobility status   Olen Pel [place], [place] [phone]\  05/17/2011, 12:48 PM

## 2011-05-17 NOTE — Progress Notes (Signed)
Daily Progress Note  S: Doing better this morning, strength in LLE improved.  Talked to her about her digoxin level being high and she states that she has increased her digoxin herself to add an additional 1/2 tab at night, because she can't sleep due to her A.fib if she doesn't.   O:  BP 125/53  Pulse 68  Temp(Src) 98.9 F (37.2 C) (Oral)  Resp 16  Ht 5\' 5"  (1.651 m)  Wt 158 lb 11.2 oz (71.986 kg)  BMI 26.41 kg/m2  SpO2 96% General appearance: alert, cooperative and no distress Throat: lips, mucosa, and tongue normal; teeth and gums normal Lungs: clear to auscultation bilaterally Heart: regular rate and rhythm, S1, S2 normal and systolic murmur: holosystolic 3/6, Abdomen: soft, non-tender; bowel sounds normal; no masses,  no organomegaly Extremities: extremities normal, atraumatic, no cyanosis or edema Skin: Skin color, texture, turgor normal. No rashes or lesions Neurologic: Mental status: Alert, oriented, thought content appropriate Cranial nerves: normal Sensory: Decreased sensation on extremities b/l, L>R Motor: Strength in LLE 4/5, can raise leg to almost 45 degree angle.   RLE 5/5, RUE 5/5, LUE 5/5   Ct Head Wo Contrast  05/16/2011  *RADIOLOGY REPORT*  Clinical Data: 78 year old female with left lower extremity weakness.  Atrial fibrillation.  CT HEAD WITHOUT CONTRAST  Technique:  Contiguous axial images were obtained from the base of the skull through the vertex without contrast.  Comparison: None.  Findings: Visualized paranasal sinuses and mastoids are clear.  No acute osseous abnormality identified.  Postoperative changes to the globes.  Negative scalp soft tissues.  Calcified atherosclerosis at the skull base.  Cerebral volume is within normal limits for age.  No ventriculomegaly. No midline shift, mass effect, or evidence of mass lesion.  No acute intracranial hemorrhage identified.  No evidence of cortically based acute infarction identified.  Mild to moderate for age  cerebral white matter hypodensity, primarily around the frontal horns. No suspicious intracranial vascular hyperdensity.  IMPRESSION: No acute intracranial abnormality.  Mild to moderate for age nonspecific white matter changes.  Original Report Authenticated By: Harley Hallmark, M.D.        Assessment/Plan [name] with acute onset LLE weakness  1. Weakness- Improved to some degree, will await workup for stroke and recommendations from PT/OT.  2. Afib- Known history of atrial fibrillation. Followed by [facility] cardiology. Last echo was in April 2012. She does have a pacemaker as well - Rate controlled  -Dig level supratherapeutic, patient has been administering 1/2 tab extra each night.  Will continue to hold and recheck a level again tomorrow.  Will contact her cardiology office to obtain her correct regimen.  3. HTN- BP stable. - Continue home medications - Continue to monitor  4. Constipation-BM x 2 since admission -continue miralx and dulcolax prn - If she develops any pain, or does not have bowel movement, will consider KUB  FEN/GI- Heart healthy diet   PPx- On Coumadin. No VTE ppx indicated  Dispo- Pending further work up, recommendations from PT/OT, and establishing proper digoxin therapy    Everrett Coombe, DO

## 2011-05-18 ENCOUNTER — Observation Stay (HOSPITAL_COMMUNITY): Payer: Medicare Other

## 2011-05-18 DIAGNOSIS — I6330 Cerebral infarction due to thrombosis of unspecified cerebral artery: Secondary | ICD-10-CM

## 2011-05-18 DIAGNOSIS — G8110 Spastic hemiplegia affecting unspecified side: Secondary | ICD-10-CM

## 2011-05-18 LAB — BASIC METABOLIC PANEL
CO2: 30 mEq/L (ref 19–32)
Chloride: 99 mEq/L (ref 96–112)
Sodium: 139 mEq/L (ref 135–145)

## 2011-05-18 LAB — PROTIME-INR: INR: 1.85 — ABNORMAL HIGH (ref 0.00–1.49)

## 2011-05-18 LAB — CBC
HCT: 40 % (ref 36.0–46.0)
Hemoglobin: 12.7 g/dL (ref 12.0–15.0)
MCV: 92 fL (ref 78.0–100.0)
RDW: 13.7 % (ref 11.5–15.5)
WBC: 7.8 10*3/uL (ref 4.0–10.5)

## 2011-05-18 LAB — DIGOXIN LEVEL: Digoxin Level: 1.6 ng/mL (ref 0.8–2.0)

## 2011-05-18 LAB — OCCULT BLOOD X 1 CARD TO LAB, STOOL: Fecal Occult Bld: POSITIVE

## 2011-05-18 MED ORDER — WARFARIN SODIUM 5 MG PO TABS
5.0000 mg | ORAL_TABLET | Freq: Once | ORAL | Status: DC
  Filled 2011-05-18: qty 1

## 2011-05-18 MED ORDER — POLYETHYLENE GLYCOL 3350 17 G PO PACK
17.0000 g | PACK | Freq: Every day | ORAL | Status: DC | PRN
  Filled 2011-05-18: qty 1

## 2011-05-18 MED ORDER — VERAPAMIL HCL ER 180 MG PO TBCR
180.0000 mg | EXTENDED_RELEASE_TABLET | Freq: Every day | ORAL | Status: DC
  Administered 2011-05-18 – 2011-05-19 (×2): 180 mg via ORAL
  Filled 2011-05-18 (×3): qty 1

## 2011-05-18 MED ORDER — DIGOXIN 125 MCG PO TABS
0.1250 mg | ORAL_TABLET | Freq: Every day | ORAL | Status: DC
  Administered 2011-05-19 – 2011-05-20 (×2): 0.125 mg via ORAL
  Filled 2011-05-18 (×2): qty 1

## 2011-05-18 NOTE — Progress Notes (Signed)
Seen and examined.  Agree with Dr. Mikel Cella.  Briefly, patient with known A fib, recent CVA, a documented hx of GI bleed and now maroon stools.  Obviously, we are between a rock and a hard place in deciding how aggressive to be with anticoagulation.  Will reinvolve GI to help perhaps give more data in making this difficult choice.

## 2011-05-18 NOTE — Progress Notes (Signed)
PGY-1 Daily Progress Note Family Medicine Teaching Service [name] Corsino M. [name] Maroney, MD Service Pager: [phone]  Subjective: Patient states she is overall feeling better. She continues to have weakness of her left lower extremity but it has improved. She is able to ambulate with a walker (not used prior to hospitalization). Also reports some black tarry stools. (We are giving Miralax for reported constipation at admission)  Objective: Vital signs in last 24 hours: Temp:  [98.1 F (36.7 C)-98.8 F (37.1 C)] 98.5 F (36.9 C) (04/22 0800) Pulse Rate:  [62-96] 62  (04/22 0800) Resp:  [16-20] 17  (04/22 0800) BP: (117-148)/(56-76) 119/56 mmHg (04/22 0800) SpO2:  [91 %-100 %] 94 % (04/22 0800) Weight change:  Last BM Date: 05/13/11  Intake/Output from previous day: 04/21 0701 - 04/22 0700 In: 600 [P.O.:600] Out: -  Intake/Output this shift: Total I/O In: 360 [P.O.:360] Out: -   Physical Exam: General appearance: alert, cooperative and no distress. Sitting in chair Lungs: clear to auscultation bilaterally  Heart: regular rate and rhythm, S1, S2 normal and systolic murmur: holosystolic 2/6,  Abdomen: soft, non-tender; bowel sounds normal Extremities: 1+ edema Neurologic: Mental status: Alert, oriented, thought content appropriate  Cranial nerves: normal  Sensory: Decreased sensation on extremities b/l, L>R  Motor: Strength in LLE 4/5, can raise leg to almost 45 degree angle. RLE 5/5, RUE 5/5, LUE 5/5  Lab Results:  Basename 05/17/11 0920 05/16/11 1000  WBC 7.7 7.9  HGB 12.0 13.5  HCT 37.0 40.4  PLT 274 278   BMET  Basename 05/18/11 0620 05/17/11 0920  NA 139 140  K 3.8 3.5  CL 99 96  CO2 30 33*  GLUCOSE 143* 208*  BUN 20 18  CREATININE 0.86 0.91  CALCIUM 8.8 9.0   A1C 6.3 TSH 2.471 LDL 90 Carotid dopplers wnl  Studies/Results: Ct Head Wo Contrast  05/18/2011  *RADIOLOGY REPORT*  Clinical Data: Persistent left-sided leg weakness.  CT HEAD WITHOUT CONTRAST   Technique:  Contiguous axial images were obtained from the base of the skull through the vertex without contrast.  Comparison: 05/16/2011.  Findings: No intracranial hemorrhage.  Now visualized is a small acute non hemorrhagic mid to posterior right frontal lobe infarct.  Moderate small vessel disease type changes.  Global atrophy without hydrocephalus.  No intracranial mass lesion detected on this unenhanced exam.  Vascular calcifications.  IMPRESSION: Small acute non hemorrhagic mid to posterior right frontal lobe infarct.  Please see above.  Original Report Authenticated By: Fuller Canada, JordanD.   Ct Head Wo Contrast  05/16/2011  *RADIOLOGY REPORT*  Clinical Data: 78 year old female with left lower extremity weakness.  Atrial fibrillation.  CT HEAD WITHOUT CONTRAST  Technique:  Contiguous axial images were obtained from the base of the skull through the vertex without contrast.  Comparison: None.  Findings: Visualized paranasal sinuses and mastoids are clear.  No acute osseous abnormality identified.  Postoperative changes to the globes.  Negative scalp soft tissues.  Calcified atherosclerosis at the skull base.  Cerebral volume is within normal limits for age.  No ventriculomegaly. No midline shift, mass effect, or evidence of mass lesion.  No acute intracranial hemorrhage identified.  No evidence of cortically based acute infarction identified.  Mild to moderate for age cerebral white matter hypodensity, primarily around the frontal horns. No suspicious intracranial vascular hyperdensity.  IMPRESSION: No acute intracranial abnormality.  Mild to moderate for age nonspecific white matter changes.  Original Report Authenticated By: Harley Hallmark, JordanD.    Medications:  I have reviewed the patient's current medications. Scheduled:    . polyethylene glycol  17 g Oral Daily  . potassium chloride  40 mEq Oral TID  . sodium chloride  3 mL Intravenous Q12H  . torsemide  20 mg Oral Daily  . verapamil  240  mg Oral QHS  . warfarin  7.5 mg Oral ONCE-1800  . Warfarin - Pharmacist Dosing Inpatient   Does not apply q1800  . DISCONTD: warfarin  5 mg Oral ONCE-1800   Continuous:  WJX:BJYNWG chloride, bisacodyl, sodium chloride, traZODone, DISCONTD: traZODone  Assessment/Plan: [name] with acute onset LLE weakness  1. Weakness- Improved to some degree. - Repeat CT scan shows frontal CVA - Await CIR consult - Continue PT/OT  2. Afib- Known history of atrial fibrillation. Followed by [facility] cardiology. Last echo was in April 2012. She does have a pacemaker as well - Rate controlled. Will decrease Verapamil to 180mg  qhs in setting of acute strok - Dig level supratherapeutic; currently holding Digoxin. Per her cardiology office she is taking 1.25mg  BID. Will restart this tomorrow at 1.25mg  daily. - Echo results pending. If she has systolic dysfunction, will consider d/c Verapamil (and/or cards consult for further medication management) - INR goal per SE cards is 1.8-2.0. Patient was 1.71 on admission with CVA. Will increase her goal to 2.0-2.5 but not higher due to history of bleed. Current INR 1/85  3. HTN- BP stable. - Continue home medications as described above - Continue Torsemide daily - Continue to monitor  4. Constipation-BM x 2 since admission which she describes as tarry and FOBT positive - Continue miralax prn  and dulcolax prn  FEN/GI- Heart healthy diet   PPx- On Coumadin. No VTE ppx indicated  Dispo- Pending further work up. Awaiting CIR consult. Will have CSW see patient in case she is not a candidate.   LOS: 2 days   Laray Corbit 05/18/2011, 10:19 AM

## 2011-05-18 NOTE — Consult Note (Signed)
Physical Medicine and Rehabilitation Consult Reason for Consult: Stroke Referring Phsyician: Teaching service [name] is an 78 y.o. female.   HPI: 78 year old right-handed female with history of atrial fibrillation/pacemaker with chronic Coumadin therapy. Admitted April 20 left side weakness. 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 MRI could not be completed due to pacemaker. Carotid Dopplers with no ICA stenosis. Echocardiogram was pending. Neurology consulted she remains on Coumadin therapy with admission INR 1.71. Physical therapy evaluation April 21 and required moderate assist to ambulate 70 feet with a rolling walker. Occupational therapy evaluation pending. Physical therapy is requested physical medicine rehabilitation consult consider inpatient rehabilitation services.  Review of Systems  Cardiovascular: Positive for palpitations and leg swelling.  Musculoskeletal: Positive for joint pain.  Neurological: Positive for dizziness.  All other systems reviewed and are negative.   Past Medical History  Diagnosis Date  . Pericardial effusion     Chronic. Last echo in April of 2012.   . Atrial fibrillation     Chronic  . Chronic anticoagulation     on Coumadin  . History of GI bleed   . HTN (hypertension)   . Sleep apnea   . Pacemaker     placed for tachybrady.   . Valvular heart disease   . Obesity   . LVH (left ventricular hypertrophy)    Past Surgical History  Procedure Date  . Pacemaker insertion     Last generator in 2006  . Breast lumpectomy     bilaterally  . Tonsillectomy   . Adenoidectomy    Family History  Problem Relation Age of Onset  . Stroke Mother   . Stroke Father   . Pneumonia Father    Social History:  reports that she has never smoked. She has never used smokeless tobacco. She reports that she does not drink alcohol or use illicit drugs. Allergies: No Known Allergies Medications Prior  to Admission  Medication Sig Dispense Refill  . digoxin (LANOXIN) 0.125 MG tablet Take 125 mcg by mouth 2 (two) times daily. Taking one tablet in the morning and a one of a tablet in the evening      . diphenhydrAMINE (BENADRYL) 25 MG tablet Take 25 mg by mouth every 6 (six) hours as needed. Sleep      . multivitamin (THERAGRAN) per tablet Take 1 tablet by mouth daily.        . verapamil (CALAN-SR) 240 MG CR tablet Take 240 mg by mouth at bedtime.      Marland Kitchen warfarin (COUMADIN) 5 MG tablet Take 2.5-5 mg by mouth. Currently taking 5mg  5 days a week. and 2.5mg  on monday and Friday.      . torsemide (DEMADEX) 20 MG tablet Take 1 tablet (20 mg total) by mouth daily.  30 tablet  11    Home: Home Living Lives With: Alone Available Help at Discharge: Family;Available 24 hours/day (daughter and son-in-law can arrange 24 hour assist) Type of Home: House Home Access: Stairs to enter [employer] of Steps: 2 Entrance Stairs-Rails: Right Home Layout: Two level;Bed/bath upstairs;Full bath on main level Alternate Level Stairs-Number of Steps: 12 Alternate Level Stairs-Rails: Right Bathroom Shower/Tub: Tub/shower unit;Curtain Bathroom Toilet: Standard Bathroom Accessibility: Yes How Accessible: Accessible via walker Home Adaptive Equipment: None (Gave away her BSC) Additional Comments: Pt's own house is 75 years old; Pt may go to daughter's home; will need specifics of daughter's home  Functional History: Prior Function Able to Take Stairs?: Yes  Driving: Yes Vocation: Retired Comments: Extremely independent prior to admission; occasionally went up and down stairs for exercise Functional Status:  Mobility: Bed Mobility Bed Mobility: Not assessed (Pt EOB upon arrival) Transfers Transfers: Sit to Stand;Stand to Sit Sit to Stand: 3: Mod assist;From bed;From chair/3-in-1;With armrests Stand to Sit: 4: Min assist;With armrests;To chair/3-in-1 Ambulation/Gait Ambulation/Gait Assistance: 3:  Mod assist Ambulation Distance (Feet): 70 Feet Assistive device: Rolling walker;1 person hand held assist Ambulation/Gait Assistance Details: Verbal and tactile cues for upright posture, full hip extension; cues to push L heel into floor during loading response to promote L stance stability; occasional L knee buckle and occasional L knee push back into genu recurvatum; mod cues for posture Gait Pattern: Decreased hip/knee flexion - left Gait velocity: slow    ADL:    Cognition: Cognition Arousal/Alertness: Awake/alert Orientation Level: Oriented X4 Cognition Overall Cognitive Status: Appears within functional limits for tasks assessed/performed Arousal/Alertness: Awake/alert Orientation Level: Oriented X4 / Intact Behavior During Session: [facility] for tasks performed  Blood pressure 119/56, pulse 62, temperature 98.5 F (36.9 C), temperature source Oral, resp. rate 17, height 5\' 5"  (1.651 m), weight 71.986 kg (158 lb 11.2 oz), SpO2 94.00%. Physical Exam  Vitals reviewed. Constitutional: She is oriented to person, place, and time. She appears well-developed.  HENT:  Head: Normocephalic.  Neck: No thyromegaly present.  Cardiovascular:       Cardiac rate control  Pulmonary/Chest: She has wheezes.  Abdominal: She exhibits no distension. There is no tenderness.  Musculoskeletal: She exhibits no edema.  Neurological: She is alert and oriented to person, place, and time.  Skin: Skin is warm and dry.  Psychiatric: She has a normal mood and affect.   motor strength is 5/5 in the right deltoid, biceps, triceps, grip, right hip flexor, knee extensor right ankle dorsiflexor and plantar flexor On the left side she has 4/5 in the left deltoid, biceps, triceps, grip, 2 minus/5 in the left hip flexor 3 minus left knee extensor and 2 minus left ankle dorsiflexor and plantar flexor. Sensation is intact to light touch in bilateral upper and bilateral lower extremities Tone is normal in the right side  and the left upper extremity. Tone is reduced in the left lower extremity Cranial nerves show no evidence of facial droop no extraocular muscle weakness  Results for orders placed during the hospital encounter of 05/16/11 (from the past 24 hour(s))  OCCULT BLOOD X 1 CARD TO LAB, STOOL     Status: Normal   Collection Time   05/18/11  4:15 AM      Component Value Range   Fecal Occult Bld POSITIVE    PROTIME-INR     Status: Abnormal   Collection Time   05/18/11  6:20 AM      Component Value Range   Prothrombin Time 21.7 (*) 11.6 - 15.2 (seconds)   INR 1.85 (*) 0.00 - 1.49   BASIC METABOLIC PANEL     Status: Abnormal   Collection Time   05/18/11  6:20 AM      Component Value Range   Sodium 139  135 - 145 (mEq/L)   Potassium 3.8  3.5 - 5.1 (mEq/L)   Chloride 99  96 - 112 (mEq/L)   CO2 30  19 - 32 (mEq/L)   Glucose, Bld 143 (*) 70 - 99 (mg/dL)   BUN 20  6 - 23 (mg/dL)   Creatinine, Ser 4.74  0.50 - 1.10 (mg/dL)   Calcium 8.8  8.4 - 25.9 (mg/dL)  GFR calc non Af Amer 59 (*) >90 (mL/min)   GFR calc Af Amer 69 (*) >90 (mL/min)  DIGOXIN LEVEL     Status: Normal   Collection Time   05/18/11  6:20 AM      Component Value Range   Digoxin Level 1.6  0.8 - 2.0 (ng/mL)   Ct Head Wo Contrast  05/18/2011  *RADIOLOGY REPORT*  Clinical Data: Persistent left-sided leg weakness.  CT HEAD WITHOUT CONTRAST  Technique:  Contiguous axial images were obtained from the base of the skull through the vertex without contrast.  Comparison: 05/16/2011.  Findings: No intracranial hemorrhage.  Now visualized is a small acute non hemorrhagic mid to posterior right frontal lobe infarct.  Moderate small vessel disease type changes.  Global atrophy without hydrocephalus.  No intracranial mass lesion detected on this unenhanced exam.  Vascular calcifications.  IMPRESSION: Small acute non hemorrhagic mid to posterior right frontal lobe infarct.  Please see above.  Original Report Authenticated By: Fuller Canada, M.D.    Ct Head Wo Contrast  05/16/2011  *RADIOLOGY REPORT*  Clinical Data: 78 year old female with left lower extremity weakness.  Atrial fibrillation.  CT HEAD WITHOUT CONTRAST  Technique:  Contiguous axial images were obtained from the base of the skull through the vertex without contrast.  Comparison: None.  Findings: Visualized paranasal sinuses and mastoids are clear.  No acute osseous abnormality identified.  Postoperative changes to the globes.  Negative scalp soft tissues.  Calcified atherosclerosis at the skull base.  Cerebral volume is within normal limits for age.  No ventriculomegaly. No midline shift, mass effect, or evidence of mass lesion.  No acute intracranial hemorrhage identified.  No evidence of cortically based acute infarction identified.  Mild to moderate for age cerebral white matter hypodensity, primarily around the frontal horns. No suspicious intracranial vascular hyperdensity.  IMPRESSION: No acute intracranial abnormality.  Mild to moderate for age nonspecific white matter changes.  Original Report Authenticated By: Harley Hallmark, M.D.    Assessment/Plan: Diagnosis: Right frontal infarct with left lower extremity greater than left upper extremity weakness 1. Does the need for close, 24 hr/day medical supervision in concert with the patient's rehab needs make it unreasonable for this patient to be served in a less intensive setting? Yes 2. Co-Morbidities requiring supervision/potential complications: Atrial fibrillation, hypertension 3. Due to bladder management, bowel management, safety, skin/wound care, disease management and patient education, does the patient require 24 hr/day rehab nursing? Yes 4. Does the patient require coordinated care of a physician, rehab nurse, PT (1-2 hrs/day, 5 days/week) and OT (1-2 hrs/day, 5 days/week) to address physical and functional deficits in the context of the above medical diagnosis(es)? Yes Addressing deficits in the following areas:  balance, endurance, locomotion, strength, transferring, bathing, dressing, grooming and toileting 5. Can the patient actively participate in an intensive therapy program of at least 3 hrs of therapy per day at least 5 days per week? Yes 6. The potential for patient to make measurable gains while on inpatient rehab is excellent 7. Anticipated functional outcomes upon discharge from inpatient rehab are supervision to modified independent mobility with PT, modified independent with ADLs with the exception of showering which will be supervision with OT, not applicable with SLP. 8. Estimated rehab length of stay to reach the above functional goals is: 10 days 9. Does the patient have adequate social supports to accommodate these discharge functional goals? Yes 10. Anticipated D/C setting: Home 11. Anticipated post D/C treatments: HH therapy 12. Overall Rehab/Functional  Prognosis: good  RECOMMENDATIONS: This patient's condition is appropriate for continued rehabilitative care in the following setting: CIR Patient has agreed to participate in recommended program. Yes Note that insurance prior authorization may be required for reimbursement for recommended care.  Comment:   ANGIULLI,DANIEL J. 05/18/2011

## 2011-05-18 NOTE — Progress Notes (Signed)
Utilization review complete 

## 2011-05-18 NOTE — Progress Notes (Signed)
Pt states blood in stool. Page MD. MD order CBC and hemocult

## 2011-05-18 NOTE — Progress Notes (Signed)
ANTICOAGULATION CONSULT NOTE - Follow Up Consult  Pharmacy Consult for Coumadin Indication: atrial fibrillation, right frontal CVA  No Known Allergies  Patient Measurements: Height: 5\' 5"  (165.1 cm) Weight: 158 lb 11.2 oz (71.986 kg) IBW/kg (Calculated) : 57   Vital Signs: Temp: 98.7 F (37.1 C) (04/22 1200) BP: 119/75 mmHg (04/22 1200) Pulse Rate: 81  (04/22 1200)  Labs:  Basename 05/18/11 0620 05/17/11 0920 05/16/11 1000  HGB -- 12.0 13.5  HCT -- 37.0 40.4  PLT -- 274 278  APTT -- -- --  LABPROT 21.7* 20.6* 20.4*  INR 1.85* 1.73* 1.71*  HEPARINUNFRC -- -- --  CREATININE 0.86 0.91 0.95  CKTOTAL -- -- --  CKMB -- -- --  TROPONINI -- -- --   Estimated Creatinine Clearance: 46.7 ml/min (by C-G formula based on Cr of 0.86).  Assessment:   INR increased to 1.85 after Coumadin 7.5 mg dose 4/21.   New target INR 2-2.5.   Tarry stool today.  Patient reports taking MVI with iron at home, but has not had any blood in stools recently.  Hx GI bleed.  Not on GI prophylaxis.  Talking laxatives for constipation.  She will keep [place] informed re: further blood in stools, but we discussed that we are trying to get her INR into 2-2.5 range.  She is in agreement with higher goal, despite blood in stool.   Goal of Therapy:    INR 2-2.5   Plan:    Coumadin 5 mg today.   Continue daily PT/INR.  CBC in am.   Will follow up for any further bloody stools.  Dennie Fetters, RPh Pager: [phone] 05/18/2011,12:37 PM

## 2011-05-18 NOTE — Evaluation (Signed)
Speech Language Pathology Evaluation Patient Details Name: [name] MRN: [id number] DOB: 02-04-1923 Today's Date: 05/18/2011  Problem List:  Patient Active Problem List  Diagnoses  . Atrial fibrillation  . Pericardial effusion  . Chronic anticoagulation  . HTN (hypertension)  . Edema  . Pacemaker  . Weakness of left side of body  . Elevated digoxin level   Past Medical History:  Past Medical History  Diagnosis Date  . Pericardial effusion     Chronic. Last echo in April of 2012.   . Atrial fibrillation     Chronic  . Chronic anticoagulation     on Coumadin  . History of GI bleed   . HTN (hypertension)   . Sleep apnea   . Pacemaker     placed for tachybrady.   . Valvular heart disease   . Obesity   . LVH (left ventricular hypertrophy)    Past Surgical History:  Past Surgical History  Procedure Date  . Pacemaker insertion     Last generator in 2006  . Breast lumpectomy     bilaterally  . Tonsillectomy   . Adenoidectomy     SLP Assessment/Plan/Recommendation Assessment Clinical Impression Statement: Pt appears to be at her baseline for cognitive-linguistic function. Pt and her niece do not report any concerns at this time. No further SLP services are recommended at this time, however education provided regarding OP SLP services as an option if pt notices difficulties with higher-level cognitive skills after d/c. SLP Recommendation/Assessment: Patient does not need any further Speech Lanaguage Pathology Services No Skilled Speech Therapy: Patient at baseline level of functioning SLP Recommendations Follow up Recommendations: None Equipment Recommended: None recommended by SLP Individuals Consulted Consulted and Agree with Results and Recommendations: Patient;Family member/caregiver Family Member Consulted : niece  SLP Goals  SLP Goals Progress/Goals/Alternative treatment plan discussed with pt/caregiver and they: Agree  Maxcine Ham 05/18/2011,  11:14 AM  Maxcine Ham, SLP Student

## 2011-05-18 NOTE — Progress Notes (Signed)
I agree with the assessments and medication admin done by Megan Roberts [employer] student from 7p to 7a. 

## 2011-05-18 NOTE — Progress Notes (Signed)
RN called MD to notify of bloody stools. Patient has had a guaiac positive stool this admission, but was not previously described as bloody.  Will hold coumadin tonight and check CBC this evening and in the morning.    [name], [name] 05/18/2011, 3:46 PM

## 2011-05-18 NOTE — Progress Notes (Signed)
Physical Therapy Treatment Patient Details Name: [name] MRN: [id number] DOB: 1923/08/03 Today's Date: 05/18/2011 Time: 7829-5621 PT Time Calculation (min): 20 min  PT Assessment / Plan / Recommendation Comments on Treatment Session  Pt is s/p Rt frontal CVA with Lt sided weakness and decreased safety judgment. Able to tolerate increased activity today compared to 4/21 and anticipate will continue to improve with therapy.     Follow Up Recommendations  Inpatient Rehab    Equipment Recommendations  Defer to next venue    Frequency Min 4X/week   Plan Discharge plan remains appropriate;Frequency remains appropriate    Precautions / Restrictions Precautions Precautions: Fall Restrictions Weight Bearing Restrictions: No   Pertinent Vitals/Pain n/a    Mobility  Bed Mobility Bed Mobility: Sit to Supine Sit to Supine: 4: Min assist;HOB flat Details for Bed Mobility Assistance: assist to lift LLE onto bed Transfers Transfers: Sit to Stand;Stand to Sit Sit to Stand: 4: Min assist;With upper extremity assist;With armrests;From chair/3-in-1;From toilet;Other (comment) (with grab bar at toilet) Stand to Sit: 4: Min assist;With upper extremity assist;With armrests;To chair/3-in-1;To toilet Details for Transfer Assistance: Stood from chair using armrests x 3; from chair with hands on her knees x5; from toilet x 1.  Pt impulsively stands before RW or gait belt are in place. Pt with poor alignment of herself with surface when preparing to sit. Requires assist to control descent and to pass through initial portion of sit to stand due to LE weakness.  Ambulation/Gait Ambulation/Gait Assistance: 3: Mod assist Ambulation Distance (Feet): 90 Feet (10 ft to bathroom; 5 ft to sink; 75 ft in hallway) Assistive device: Rolling walker Ambulation/Gait Assistance Details: verbal cues and physical assist to stand erect and to extend Lt knee in stance; as pt fatigues, Lt foot clearance diminishes  and she begins to use steppage gait on the Lt and Lt knee "gives/partial buckle"; pt with poor coordination with turning with RW (pushes it too far ahead, poor coordination stepping Lt foot around) Gait velocity: diminished Modified Rankin (Stroke Patients Only) Modified Rankin: Moderately severe disability    Exercises Low Level/ICU Exercises Ankle Circles/Pumps: AROM;Both;20 reps;Supine;Other (comment) (instructed to do every hour when awake) Stabilized Bridging: AROM;Both;5 reps;Supine;Other (comment) (instructed to keep knees ~2" apart) Other Exercises Other Exercises: sit to stand from chair x5 with min assist; hands on knees   PT Goals Acute Rehab PT Goals PT Goal: Sit to Supine/Side - Progress: Progressing toward goal PT Goal: Sit to Stand - Progress: Progressing toward goal PT Goal: Stand to Sit - Progress: Progressing toward goal PT Goal: Ambulate - Progress: Progressing toward goal  Visit Information  Last PT Received On: 05/18/11 Assistance Needed: +1    Subjective Data  Subjective: My niece can help me get up...she helps with her aunt that is paralyzed.  (Reiterated to pt and niece that she should not help pt ambulate yet)   Cognition  Overall Cognitive Status: Impaired Area of Impairment: Safety/judgement;Awareness of deficits Arousal/Alertness: Awake/alert Orientation Level: Appears intact for tasks assessed Behavior During Session: Other (comment) (impulsive--really have to get her attention to slow down) Safety/Judgement: Decreased awareness of safety precautions;Decreased safety judgement for tasks assessed;Impulsive Safety/Judgement - Other Comments: see subjective Awareness of Deficits: decr awareness of LLE weakness and risk of falling with buckling    Balance     End of Session PT - End of Session Equipment Utilized During Treatment: Gait belt Activity Tolerance: Patient limited by fatigue Patient left: in bed;with call bell/phone within reach;with  family/visitor  present    Raymond Azure 05/18/2011, 12:23 PM

## 2011-05-18 NOTE — Progress Notes (Signed)
Stroke Team Progress Note  HISTORY [name] is an 78 y.o. female with a history of atrial fibrillation, hypertension, pacemaker placement and heart valvular disease, presenting with new onset left lower extremity weakness as well as coordination difficulty of left hand. Patient woke up with these symptoms around 4 AM 05/16/2011. She was last known normal at bedtime on 05/15/2011. She took a nap after waking up this morning and had worsening of her left lower extremity weakness when she woke up. Strength has improved slightly since she arrived in the emergency room. CT scan of the head showed no acute intracranial abnormality. MRI study could not be obtained because patient has pacemaker. NIH stroke score was 4. She is on Coumadin and has an INR of 1.71 today. Patient was not a TPA candidate secondary to delay in arrival. She was admitted for further evaluation and treatment.  SUBJECTIVE Her daughter is at the bedside.  Overall she feels her condition is gradually improving. She will need rehab per daughter.She still has mild left leg weakness.  OBJECTIVE Most recent Vital Signs: Filed Vitals:   05/17/11 1600 05/17/11 2200 05/18/11 0600 05/18/11 0800  BP: 148/76 145/67 127/63 119/56  Pulse: 84 96 75 62  Temp: 98.1 F (36.7 C) 98.8 F (37.1 C) 98.4 F (36.9 C) 98.5 F (36.9 C)  TempSrc: Oral     Resp: 20 18 16 17   Height:      Weight:      SpO2: 96% 100% 91% 94%   CBG (last 3)  No results found for this basename: GLUCAP:3 in the last 72 hours Intake/Output from previous day: 04/21 0701 - 04/22 0700 In: 600 [P.O.:600] Out: -   IV Fluid Intake:     MEDICATIONS    . digoxin  0.125 mg Oral Daily  . sodium chloride  3 mL Intravenous Q12H  . torsemide  20 mg Oral Daily  . verapamil  180 mg Oral QHS  . warfarin  7.5 mg Oral ONCE-1800  . Warfarin - Pharmacist Dosing Inpatient   Does not apply q1800  . DISCONTD: polyethylene glycol  17 g Oral Daily  . DISCONTD: verapamil  240 mg  Oral QHS  . DISCONTD: warfarin  5 mg Oral ONCE-1800   PRN:  sodium chloride, bisacodyl, polyethylene glycol, sodium chloride, traZODone, DISCONTD: traZODone  Diet:  Cardiac thin liquids Activity:  Up with assistance DVT Prophylaxis:  coumadin  CLINICALLY SIGNIFICANT STUDIES CBC    Component Value Date/Time   WBC 7.7 05/17/2011 0920   RBC 4.10 05/17/2011 0920   HGB 12.0 05/17/2011 0920   HCT 37.0 05/17/2011 0920   PLT 274 05/17/2011 0920   MCV 90.2 05/17/2011 0920   MCH 29.3 05/17/2011 0920   MCHC 32.4 05/17/2011 0920   RDW 13.8 05/17/2011 0920   LYMPHSABS 1.2 05/16/2011 1000   MONOABS 0.8 05/16/2011 1000   EOSABS 0.1 05/16/2011 1000   BASOSABS 0.0 05/16/2011 1000   CMP    Component Value Date/Time   NA 139 05/18/2011 0620   K 3.8 05/18/2011 0620   CL 99 05/18/2011 0620   CO2 30 05/18/2011 0620   GLUCOSE 143* 05/18/2011 0620   BUN 20 05/18/2011 0620   CREATININE 0.86 05/18/2011 0620   CALCIUM 8.8 05/18/2011 0620   PROT 6.6 05/17/2011 0920   ALBUMIN 3.6 05/17/2011 0920   AST 20 05/17/2011 0920   ALT 17 05/17/2011 0920   ALKPHOS 79 05/17/2011 0920   BILITOT 0.8 05/17/2011 0920   GFRNONAA 59* 05/18/2011  4098   GFRAA 69* 05/18/2011 0620   COAGS Lab Results  Component Value Date   INR 1.85* 05/18/2011   INR 1.73* 05/17/2011   INR 1.71* 05/16/2011   Lipid Panel    Component Value Date/Time   CHOL 141 05/17/2011 0920   TRIG 107 05/17/2011 0920   HDL 30* 05/17/2011 0920   CHOLHDL 4.7 05/17/2011 0920   VLDL 21 05/17/2011 0920   LDLCALC 90 05/17/2011 0920   HgbA1C  Lab Results  Component Value Date   HGBA1C 6.3* 05/16/2011   Cardiac Panel (last 3 results) No results found for this basename: CKTOTAL:3,CKMB:3,TROPONINI:3,RELINDX:3 in the last 72 hours Urinalysis    Component Value Date/Time   COLORURINE YELLOW 05/16/2011 1047   APPEARANCEUR CLEAR 05/16/2011 1047   LABSPEC 1.009 05/16/2011 1047   PHURINE 7.0 05/16/2011 1047   GLUCOSEU NEGATIVE 05/16/2011 1047   HGBUR MODERATE* 05/16/2011 1047    BILIRUBINUR NEGATIVE 05/16/2011 1047   KETONESUR NEGATIVE 05/16/2011 1047   PROTEINUR NEGATIVE 05/16/2011 1047   UROBILINOGEN 1.0 05/16/2011 1047   NITRITE NEGATIVE 05/16/2011 1047   LEUKOCYTESUR NEGATIVE 05/16/2011 1047   Urine Drug Screen  No results found for this basename: labopia,  cocainscrnur,  labbenz,  amphetmu,  thcu,  labbarb    Alcohol Level No results found for this basename: eth   CT of the brain  05/18/2011  Small acute non hemorrhagic mid to posterior right frontal lobe infarct.  MRI of the brain  Pacemaker   MRA of the brain  pacemaker  2D Echocardiogram    Carotid Doppler  No ICA stenosis. Vertebral artery flow is antegrade.  CXR    EKG  Ventricular paced rhythm .   Physical Exam    Pleasant elderly caucasian lady not in distress.Awake alert. Afebrile. Head is nontraumatic. Neck is supple without bruit. Hearing is normal. Cardiac exam no murmur or gallop. Lungs are clear to auscultation. Distal pulses are well felt.   Neurologic Exam :Awake alert oriented x 3 normal speech and language. No face asymmetry. Tongue midline. No drift. No upper extremity weakness. ..left lower extremity 4/5 strength with hip and ankle dorsiflexor weakness. Normal sensation . Normal coordination. Gait deferred.  ASSESSMENT [name] is a 78 y.o. female with a subcortical right cerebral infarction secondary to atrial fibrillation. On warfarin prior to admission; INR on arrival 1.71 Now on warfarin for secondary stroke prevention. Patient with resultant left hemiparesis and incoordination, ataxia. Therapies recommend IP rehab.  Afib- on coumadin but suboptimal INR on admission HTN- controlled  A1C - 6.3- borderline DM  Hospital day # 2  TREATMENT/PLAN -Continue warfarin for secondary stroke prevention. -follow up 2D  -inpatient rehab consult pending -D/w patient and daughter [name], GNP-BC [facility] Pager: [phone] 05/18/2011 12:07  PM  Dr. Delia Heady, Stroke Center Medical Director, has personally reviewed chart, pertinent data, examined the patient and developed the plan of care.

## 2011-05-18 NOTE — Evaluation (Signed)
Occupational Therapy Evaluation Patient Details Name: [name] MRN: [id number] DOB: 1923-09-15 Today's Date: 05/18/2011 Time: 1340-1401 OT Time Calculation (min): 21 min  OT Assessment / Plan / Recommendation Clinical Impression  Pt presents to OT with decreased I with ADL activity s/p admission to hospital with CVA. Pt will benefit from OT to increase functional use LUE, as well as LLE to increase I with ADL activity in order for pt to return home with daughter    OT Assessment  Patient needs continued OT Services    Follow Up Recommendations  Inpatient Rehab    Equipment Recommendations  Defer to next venue    Frequency Min 2X/week    Precautions / Restrictions Precautions Precautions: Fall   Pertinent Vitals/Pain No pain    ADL  Where Assessed - Eating/Feeding: Chair Grooming: Performed Where Assessed - Grooming: Unsupported sitting Upper Body Bathing: Simulated;Minimal assistance Where Assessed - Upper Body Bathing: Unsupported;Sitting, bed Lower Body Bathing: Simulated;Moderate assistance Where Assessed - Lower Body Bathing: Sit to stand from bed Upper Body Dressing: Simulated;Minimal assistance Where Assessed - Upper Body Dressing: Sitting, bed;Unsupported Lower Body Dressing: Performed;Moderate assistance Where Assessed - Lower Body Dressing: Sit to stand from bed Toilet Transfer: Performed;Moderate assistance Toilet Transfer Method: Ambulating;Other (comment) (with walker) Toilet Transfer Equipment: Comfort height toilet Toileting - Clothing Manipulation: Performed;Moderate assistance Where Assessed - Toileting Clothing Manipulation: Standing Toileting - Hygiene: Performed;Moderate assistance Where Assessed - Toileting Hygiene: Standing Tub/Shower Transfer: Not assessed Tub/Shower Transfer Method: Not assessed    OT Goals Acute Rehab OT Goals OT Goal Formulation: With patient Time For Goal Achievement: 06/01/11 Potential to Achieve Goals: Good ADL  Goals Pt Will Perform Grooming: with supervision;Standing at sink ADL Goal: Grooming - Progress: Goal set today Pt Will Transfer to Toilet: with set-up;Comfort height toilet ADL Goal: Toilet Transfer - Progress: Goal set today Pt Will Perform Toileting - Clothing Manipulation: with supervision;Standing ADL Goal: Toileting - Clothing Manipulation - Progress: Goal set today Pt Will Perform Toileting - Hygiene: with supervision;Sit to stand from 3-in-1/toilet ADL Goal: Toileting - Hygiene - Progress: Goal set today Arm Goals Additional Arm Goal #1: Pt will peform HEP for coordination and strengthening for LUE to increase I with ADL activity with S and handout.  Visit Information  Assistance Needed: +1    Subjective Data  Subjective: I want to get back to doing for myself!   Prior Functioning  Home Living Lives With: Alone Available Help at Discharge: Family;Available 24 hours/day (daughter and son-in-law can arrange 24 hour assist) Type of Home: House Home Access: Stairs to enter [employer] of Steps: 2 Entrance Stairs-Rails: Right Home Layout: Two level;Bed/bath upstairs;Full bath on main level Alternate Level Stairs-Number of Steps: 12 Alternate Level Stairs-Rails: Right Bathroom Shower/Tub: Tub/shower unit;Curtain Bathroom Toilet: Standard Bathroom Accessibility: Yes How Accessible: Accessible via walker Home Adaptive Equipment: None (Gave away her BSC) Additional Comments: Pt's own house is 76 years old; Pt may go to daughter's home; will need specifics of daughter's home Prior Function Level of Independence: Independent Able to Take Stairs?: Yes Driving: Yes Vocation: Retired Musician: HOH;Receptive difficulties (Requested this therapist speak more slowly) Dominant Hand: Right    Cognition  Overall Cognitive Status: Appears within functional limits for tasks assessed/performed Area of Impairment: Safety/judgement;Awareness of  deficits Arousal/Alertness: Awake/alert Orientation Level: Appears intact for tasks assessed Behavior During Session: Other (comment) (impulsive--really have to get her attention to slow down) Safety/Judgement: Decreased awareness of safety precautions;Decreased safety judgement for tasks assessed;Impulsive Safety/Judgement - Other  Comments: see subjective Awareness of Deficits: decr awareness of LLE weakness and risk of falling with buckling Cognition - Other Comments: OT reinterated LLE weakness and importance of calling for assist to get OOB.    Extremity/Trunk Assessment Right Upper Extremity Assessment RUE ROM/Strength/Tone: Within functional levels Left Upper Extremity Assessment LUE ROM/Strength/Tone: Deficits LUE ROM/Strength/Tone Deficits: decreased strength LUE LUE Sensation: WFL - Light Touch LUE Coordination: Deficits LUE Coordination Deficits: noted decreased coordinatinon LUE with finger nose finger task and functional reach activity Trunk Assessment Trunk Assessment: Normal   Mobility Bed Mobility Bed Mobility: Sit to Supine Sit to Supine: 4: Min assist;HOB flat Details for Bed Mobility Assistance: assist to lift LLE onto bed Transfers Sit to Stand: 4: Min assist;With upper extremity assist;With armrests;From chair/3-in-1;From toilet;Other (comment) (with grab bar at toilet) Stand to Sit: 4: Min assist;With upper extremity assist;With armrests;To chair/3-in-1;To toilet Details for Transfer Assistance: Stood from chair using armrests x 3; from chair with hands on her knees x5; from toilet x 1.  Pt impulsively stands before RW or gait belt are in place. Pt with poor alignment of herself with surface when preparing to sit. Requires assist to control descent and to pass through initial portion of sit to stand due to LE weakness.    Exercise Low Level/ICU Exercises Ankle Circles/Pumps: AROM;Both;20 reps;Supine;Other (comment) (instructed to do every hour when awake) Stabilized  Bridging: AROM;Both;5 reps;Supine;Other (comment) (instructed to keep knees ~2" apart) Other Exercises Other Exercises: sit to stand from chair x5 with min assist; hands on knees     End of Session OT - End of Session Activity Tolerance: Patient tolerated treatment well Patient left: with call bell/phone within reach;with family/visitor present   Lannah Koike, [place] 05/18/2011, 2:12 PM

## 2011-05-18 NOTE — Progress Notes (Addendum)
Family Medicine Brief Progress Note  Came to speak with patient regarding bloody stools.  She says there has been some confusion about testing her stool for blood- that someone had told her her fecal occult blood was positive, but she was pretty sure hers had not been tested yet.  She says she had 3 maroon, tarry stools today, which were flushed as the tech thought her stool was already positive.  Patient had GI bleed about 10 years ago, but says they never told her if it was upper or lower GI.  She says Dr. Vincent Peyer is her gastroenterologist now.   Discussed with [name] that her story is concerning that she is bleeding from her GI tract, would test her stool again with next bowel movement.  I discussed risks/benefits of coumadin.  She has a-fib and a CVA, clearly at risk of re-infarction, but also likely has a GI bleed, and could be at high risk for occult hemorrhage.  Patient seems to fully understand both risks, and says that she would rather "bleed out than be incapacitated."    - Patient's INR goal had been 1.8-2.0 previously, we had discussed changing this to 2.0-2.5, will need to re-address as a team considering likely GI bleed, if she is to continue anticoagulation.    - She has not received coumadin today, will monitor stools, re-check cbc this evening, and hold coumadin tonight.   - patient seems competent to make her own decisions at this point, but in the interest of "doing no harm" we may have to stop coumadin if she continues to bleed.  - will consider GI consult.   [name] 05/18/2011 4:16 PM

## 2011-05-19 ENCOUNTER — Encounter (HOSPITAL_COMMUNITY): Payer: Self-pay | Admitting: *Deleted

## 2011-05-19 ENCOUNTER — Encounter (HOSPITAL_COMMUNITY)
Admission: EM | Disposition: A | Discharge status: Rehabilitation Facility | Payer: Self-pay | Source: Home / Self Care | Attending: Family Medicine

## 2011-05-19 DIAGNOSIS — K922 Gastrointestinal hemorrhage, unspecified: Secondary | ICD-10-CM | POA: Diagnosis not present

## 2011-05-19 HISTORY — PX: ESOPHAGOGASTRODUODENOSCOPY: SHX5428

## 2011-05-19 LAB — BASIC METABOLIC PANEL
BUN: 23 mg/dL (ref 6–23)
CO2: 28 mEq/L (ref 19–32)
Chloride: 100 mEq/L (ref 96–112)
Creatinine, Ser: 0.83 mg/dL (ref 0.50–1.10)
GFR calc Af Amer: 72 mL/min — ABNORMAL LOW (ref >90)
Glucose, Bld: 132 mg/dL — ABNORMAL HIGH (ref 70–99)
Potassium: 3.7 mEq/L (ref 3.5–5.1)

## 2011-05-19 LAB — CBC
HCT: 36 % (ref 36.0–46.0)
HCT: 39.6 % (ref 36.0–46.0)
Hemoglobin: 11.7 g/dL — ABNORMAL LOW (ref 12.0–15.0)
Hemoglobin: 12.9 g/dL (ref 12.0–15.0)
MCH: 29.2 pg (ref 26.0–34.0)
MCHC: 32.6 g/dL (ref 30.0–36.0)
MCV: 90 fL (ref 78.0–100.0)
MCV: 89.6 fL (ref 78.0–100.0)
RBC: 4 MIL/uL (ref 3.87–5.11)
RBC: 4.42 MIL/uL (ref 3.87–5.11)
WBC: 7 10*3/uL (ref 4.0–10.5)

## 2011-05-19 LAB — PROTIME-INR: INR: 2.13 — ABNORMAL HIGH (ref 0.00–1.49)

## 2011-05-19 SURGERY — EGD (ESOPHAGOGASTRODUODENOSCOPY)
Anesthesia: Moderate Sedation

## 2011-05-19 MED ORDER — MIDAZOLAM HCL 10 MG/2ML IJ SOLN
INTRAMUSCULAR | Status: DC | PRN
  Administered 2011-05-19 (×3): 1 mg via INTRAVENOUS

## 2011-05-19 MED ORDER — MIDAZOLAM HCL 10 MG/2ML IJ SOLN
INTRAMUSCULAR | Status: AC
  Filled 2011-05-19: qty 2

## 2011-05-19 MED ORDER — FENTANYL CITRATE 0.05 MG/ML IJ SOLN
INTRAMUSCULAR | Status: AC
  Filled 2011-05-19: qty 2

## 2011-05-19 MED ORDER — PANTOPRAZOLE SODIUM 40 MG PO TBEC: 40.0000 mg | DELAYED_RELEASE_TABLET | Freq: Every day | ORAL | Status: DC

## 2011-05-19 MED ORDER — BUTAMBEN-TETRACAINE-BENZOCAINE 2-2-14 % EX AERO
INHALATION_SPRAY | CUTANEOUS | Status: DC | PRN
  Administered 2011-05-19: 1 via TOPICAL

## 2011-05-19 MED ORDER — SODIUM CHLORIDE 0.9 % IV SOLN
INTRAVENOUS | Status: DC
  Administered 2011-05-19: 500 mL via INTRAVENOUS

## 2011-05-19 MED ORDER — PANTOPRAZOLE SODIUM 40 MG IV SOLR
40.0000 mg | INTRAVENOUS | Status: DC
  Administered 2011-05-19: 40 mg via INTRAVENOUS
  Filled 2011-05-19 (×2): qty 40

## 2011-05-19 MED ORDER — FENTANYL NICU IV SYRINGE 50 MCG/ML
INJECTION | INTRAMUSCULAR | Status: DC | PRN
  Administered 2011-05-19: 25 ug via INTRAVENOUS

## 2011-05-19 NOTE — Progress Notes (Signed)
Occupational Therapy Treatment Patient Details Name: [name] MRN: [id number] DOB: 09-Jul-1923 Today's Date: 05/19/2011   OT Assessment / Plan / Recommendation    Follow Up Recommendations  Inpatient Rehab    Equipment Recommendations  Defer to next venue    Frequency Min 2X/week   Plan   CIR   Precautions / Restrictions Precautions Precautions: Fall Restrictions Weight Bearing Restrictions: No       ADL  Grooming: Performed;Minimal assistance Where Assessed - Grooming: Standing at sink Upper Body Bathing: Performed;Minimal assistance Where Assessed - Upper Body Bathing: Standing at sink Lower Body Bathing: Performed;Moderate assistance Where Assessed - Lower Body Bathing: Standing at sink;Sit to stand from chair Upper Body Dressing: Performed;Minimal assistance Where Assessed - Upper Body Dressing: Sit to stand from chair Lower Body Dressing: Performed;Moderate assistance Where Assessed - Lower Body Dressing: Sitting, chair;Standing;Sit to stand from chair Toilet Transfer: Minimal assistance Toilet Transfer Method: Ambulating;Other (comment) (with walker) Toilet Transfer Equipment: Comfort height toilet;Grab bars Toileting - Clothing Manipulation: Performed;Minimal assistance Where Assessed - Glass blower/designer Manipulation: Standing Toileting - Hygiene: Performed;Minimal assistance Where Assessed - Toileting Hygiene: Standing ADL Comments: Pt needed verbal cues to keep L knee straight during bathing in standing activity    OT Goals ADL Goals ADL Goal: Grooming - Progress: Progressing toward goals Pt Will Perform Upper Body Bathing: with modified independence;Sit to stand from chair ADL Goal: Upper Body Bathing - Progress: Goal set today Pt Will Perform Lower Body Bathing: with modified independence;Sit to stand from chair ADL Goal: Lower Body Bathing - Progress: Goal set today Pt Will Perform Upper Body Dressing: with modified independence ADL Goal: Upper  Body Dressing - Progress: Goal set today Pt Will Perform Lower Body Dressing: with modified independence ADL Goal: Lower Body Dressing - Progress: Goal set today Pt Will Transfer to Toilet: with modified independence;Comfort height toilet ADL Goal: Toilet Transfer - Progress: Progressing toward goals ADL Goal: Toileting - Clothing Manipulation - Progress: Progressing toward goals ADL Goal: Toileting - Hygiene - Progress: Progressing toward goals  Visit Information  Assistance Needed: +1          Cognition  Overall Cognitive Status: Appears within functional limits for tasks assessed/performed Area of Impairment: Safety/judgement;Awareness of deficits Arousal/Alertness: Awake/alert Orientation Level: Appears intact for tasks assessed Behavior During Session: [facility] for tasks performed (Pt continues to demonstrate some impulsivity, but improving) Safety/Judgement: Decreased awareness of safety precautions;Decreased safety judgement for tasks assessed;Impulsive Cognition - Other Comments: Pt makes comments throughout tx as though she understands safety needs, but difficult to follow due to high level of indepenance    Mobility Bed Mobility Bed Mobility: Sit to Supine Sit to Supine: 4: Min assist;HOB flat Details for Bed Mobility Assistance: assist to lift LLE onto bed Transfers Sit to Stand: 4: Min assist;With upper extremity assist;With armrests;From chair/3-in-1;From toilet;Other (comment) (with grab bar at toilet) Stand to Sit: 4: Min assist;With upper extremity assist;With armrests;To chair/3-in-1;To toilet         End of Session OT - End of Session Activity Tolerance: Patient tolerated treatment well Patient left: with call bell/phone within reach   [facility], [place] 05/19/2011, 11:54 AM

## 2011-05-19 NOTE — Progress Notes (Signed)
Seen and examined earlier today.  Agree with Dr. Elwyn Reach.  At the time I saw her, GI consult was pending.  They have now evaluated and I greatly appreciate their help with this difficult problem of need for anticoag in the presence of GI bleed.

## 2011-05-19 NOTE — Consult Note (Addendum)
Referring Provider: Dr. Judie Petit. Chambliss Primary Care Physician:  Lucilla Edin, MD, MD Primary Gastroenterologist:  Dr. Matthias Hughs  Reason for Consultation:  GI bleed  HPI: [name] is a 78 y.o. female admitted for a stroke who reports having a maroon stool this past Sunday and then having 4 dark black stools yesterday with a streak of red blood in them without any associated abdominal pain/N/V/weakness/lightheadedness. No BMs today. Denies any previous history of ulcers. She does have a history of a cecal AVM seen on a colonoscopy in 03/2010 that also showed sigmoid diverticulosis. She is on Coumadin due to Afib and INR is currently 2.13. Her daughter reports that she had a nosebleed last Thursday and the patient says it lasted awhile before it stopped. Denies any rectal bleeding prior to Sunday.  Past Medical History  Diagnosis Date  . Pericardial effusion     Chronic. Last echo in April of 2012.   . Atrial fibrillation     Chronic  . Chronic anticoagulation     on Coumadin  . History of GI bleed   . HTN (hypertension)   . Sleep apnea   . Pacemaker     placed for tachybrady.   . Valvular heart disease   . Obesity   . LVH (left ventricular hypertrophy)     Past Surgical History  Procedure Date  . Pacemaker insertion     Last generator in 2006  . Breast lumpectomy     bilaterally  . Tonsillectomy   . Adenoidectomy     Prior to Admission medications   Medication Sig Start Date End Date Taking? Authorizing Provider  digoxin (LANOXIN) 0.125 MG tablet Take 125 mcg by mouth 2 (two) times daily. Taking one tablet in the morning and a one of a tablet in the evening 02/27/11 02/27/12 Yes Vesta Mixer, MD  diphenhydrAMINE (BENADRYL) 25 MG tablet Take 25 mg by mouth every 6 (six) hours as needed. Sleep   Yes Historical Provider, MD  multivitamin [facility]) per tablet Take 1 tablet by mouth daily.     Yes Historical Provider, MD  verapamil (CALAN-SR) 240 MG CR tablet Take 240 mg by  mouth at bedtime.   Yes Historical Provider, MD  warfarin (COUMADIN) 5 MG tablet Take 2.5-5 mg by mouth. Currently taking 5mg  5 days a week. and 2.5mg  on monday and Friday. 11/04/10  Yes Vesta Mixer, MD  torsemide (DEMADEX) 20 MG tablet Take 1 tablet (20 mg total) by mouth daily. 04/24/10 04/24/11  Rosalio Macadamia, NP    Scheduled Meds:   . digoxin  0.125 mg Oral Daily  . pantoprazole  40 mg Oral Q0600  . sodium chloride  3 mL Intravenous Q12H  . torsemide  20 mg Oral Daily  . verapamil  180 mg Oral QHS  . Warfarin - Pharmacist Dosing Inpatient   Does not apply q1800  . DISCONTD: warfarin  5 mg Oral ONCE-1800   Continuous Infusions:  PRN Meds:.sodium chloride, bisacodyl, polyethylene glycol, sodium chloride, traZODone  Allergies as of 05/16/2011  . (No Known Allergies)    Family History  Problem Relation Age of Onset  . Stroke Mother   . Stroke Father   . Pneumonia Father     History   Social History  . Marital Status: Widowed    Spouse Name: N/A    Number of Children: N/A  . Years of Education: N/A   Occupational History  . Not on file.   Social History Main  Topics  . Smoking status: Never Smoker   . Smokeless tobacco: Never Used  . Alcohol Use: No  . Drug Use: No  . Sexually Active: No   Other Topics Concern  . Not on file   Social History Narrative  . No narrative on file    Review of Systems: All negative except as stated above in HPI.  Physical Exam: Vital signs: Filed Vitals:   05/19/11 0800  BP: 120/60  Pulse: 64  Temp: 98 F (36.7 C)  Resp: 18   Last BM Date: 05/18/11 General:   Elderly, frail, alert, well-nourished, no acute distress  Lungs:  Clear throughout to auscultation.   No wheezes, crackles, or rhonchi. No acute distress. Heart:  Regular rate and rhythm; no murmurs, clicks, rubs,  or gallops. Abdomen: soft, NT, ND, +BS  Rectal:  Deferred  GI:  Lab Results:  Basename 05/19/11 0546 05/18/11 1643 05/17/11 0920  WBC 7.0 7.8  7.7  HGB 11.7* 12.7 12.0  HCT 36.0 40.0 37.0  PLT 268 308 274   BMET  Basename 05/19/11 0546 05/18/11 0620 05/17/11 0920  NA 139 139 140  K 3.7 3.8 3.5  CL 100 99 96  CO2 28 30 33*  GLUCOSE 132* 143* 208*  BUN 23 20 18   CREATININE 0.83 0.86 0.91  CALCIUM 9.0 8.8 9.0   LFT  Basename 05/17/11 0920  PROT 6.6  ALBUMIN 3.6  AST 20  ALT 17  ALKPHOS 79  BILITOT 0.8  BILIDIR --  IBILI --   PT/INR  Basename 05/19/11 0546 05/18/11 0620  LABPROT 24.2* 21.7*  INR 2.13* 1.85*     Studies/Results: Ct Head Wo Contrast  05/18/2011  *RADIOLOGY REPORT*  Clinical Data: Persistent left-sided leg weakness.  CT HEAD WITHOUT CONTRAST  Technique:  Contiguous axial images were obtained from the base of the skull through the vertex without contrast.  Comparison: 05/16/2011.  Findings: No intracranial hemorrhage.  Now visualized is a small acute non hemorrhagic mid to posterior right frontal lobe infarct.  Moderate small vessel disease type changes.  Global atrophy without hydrocephalus.  No intracranial mass lesion detected on this unenhanced exam.  Vascular calcifications.  IMPRESSION: Small acute non hemorrhagic mid to posterior right frontal lobe infarct.  Please see above.  Original Report Authenticated By: Fuller Canada, M.D.    Impression/Plan: 78yo with recent CVA due to Afib, which she is on Coumadin for. She states that she would rather bleed than become incapacitated from another stroke. Bleeding concerning for a peptic ulcer source vs. AVM. Will do EGD since will be back on chronic Coumadin at discharge to assess for an ulcer. Will give FFP if needed during procedure if active bleeding seen or complicated ulcer found. NPO. Her epistaxis could be a source of her black stools as well even though it occurred about 3 days before the first rectal bleeding episode occurred.    LOS: 3 days   Saniya Tranchina C.  05/19/2011, 12:17 PM

## 2011-05-19 NOTE — Progress Notes (Signed)
Rehab admissions - Evaluated for possible admission.  I spoke with patient and her daughter.  Patient would like to come to inpatient rehab prior to home.  Patient hopes she will not have to go home with daughter, but dtr says mom can stay with her if needed.  Currently undergoing workup for guiac positive stools.  I will check back in am.  Once workup is complete and patient deemed medically stable, I can admit to inpatient rehab.  Call me for questions.  #[phone]

## 2011-05-19 NOTE — H&P (View-Only) (Signed)
Stroke Team Progress Note  HISTORY [name] is an 78 y.o. female with a history of atrial fibrillation, hypertension, pacemaker placement and heart valvular disease, presenting with new onset left lower extremity weakness as well as coordination difficulty of left hand. Patient woke up with these symptoms around 4 AM 05/16/2011. She was last known normal at bedtime on 05/15/2011. She took a nap after waking up this morning and had worsening of her left lower extremity weakness when she woke up. Strength has improved slightly since she arrived in the emergency room. CT scan of the head showed no acute intracranial abnormality. MRI study could not be obtained because patient has pacemaker. NIH stroke score was 4. She is on Coumadin and has an INR of 1.71 today. Patient was not a TPA candidate secondary to delay in arrival. She was admitted for further evaluation and treatment.  SUBJECTIVE Patient currently bathing. Carotid dopplers no stenosis. INR is optimal today.  OBJECTIVE Most recent Vital Signs: Filed Vitals:   05/18/11 2000 05/19/11 0000 05/19/11 0400 05/19/11 0800  BP: 138/72 126/60 98/56 120/60  Pulse: 76 56 67 64  Temp: 98.7 F (37.1 C) 98.6 F (37 C) 98 F (36.7 C) 98 F (36.7 C)  TempSrc:  Oral Oral Oral  Resp: 16 18 18 18   Height:      Weight:      SpO2: 95% 96% 93% 97%   CBG (last 3)  No results found for this basename: GLUCAP:3 in the last 72 hours Intake/Output from previous day: 04/22 0701 - 04/23 0700 In: 720 [P.O.:720] Out: 200 [Urine:200]  IV Fluid Intake:     MEDICATIONS    . digoxin  0.125 mg Oral Daily  . sodium chloride  3 mL Intravenous Q12H  . torsemide  20 mg Oral Daily  . verapamil  180 mg Oral QHS  . Warfarin - Pharmacist Dosing Inpatient   Does not apply q1800  . DISCONTD: polyethylene glycol  17 g Oral Daily  . DISCONTD: verapamil  240 mg Oral QHS  . DISCONTD: warfarin  5 mg Oral ONCE-1800   PRN:  sodium chloride, bisacodyl, polyethylene  glycol, sodium chloride, traZODone  Diet:  Cardiac thin liquids Activity:  Up with assistance DVT Prophylaxis:  coumadin  CLINICALLY SIGNIFICANT STUDIES CBC    Component Value Date/Time   WBC 7.0 05/19/2011 0546   RBC 4.00 05/19/2011 0546   HGB 11.7* 05/19/2011 0546   HCT 36.0 05/19/2011 0546   PLT 268 05/19/2011 0546   MCV 90.0 05/19/2011 0546   MCH 29.3 05/19/2011 0546   MCHC 32.5 05/19/2011 0546   RDW 13.8 05/19/2011 0546   LYMPHSABS 1.2 05/16/2011 1000   MONOABS 0.8 05/16/2011 1000   EOSABS 0.1 05/16/2011 1000   BASOSABS 0.0 05/16/2011 1000   CMP    Component Value Date/Time   NA 139 05/19/2011 0546   K 3.7 05/19/2011 0546   CL 100 05/19/2011 0546   CO2 28 05/19/2011 0546   GLUCOSE 132* 05/19/2011 0546   BUN 23 05/19/2011 0546   CREATININE 0.83 05/19/2011 0546   CALCIUM 9.0 05/19/2011 0546   PROT 6.6 05/17/2011 0920   ALBUMIN 3.6 05/17/2011 0920   AST 20 05/17/2011 0920   ALT 17 05/17/2011 0920   ALKPHOS 79 05/17/2011 0920   BILITOT 0.8 05/17/2011 0920   GFRNONAA 62* 05/19/2011 0546   GFRAA 72* 05/19/2011 0546   COAGS Lab Results  Component Value Date   INR 2.13* 05/19/2011   INR 1.85* 05/18/2011  INR 1.73* 05/17/2011   Lipid Panel    Component Value Date/Time   CHOL 141 05/17/2011 0920   TRIG 107 05/17/2011 0920   HDL 30* 05/17/2011 0920   CHOLHDL 4.7 05/17/2011 0920   VLDL 21 05/17/2011 0920   LDLCALC 90 05/17/2011 0920   HgbA1C  Lab Results  Component Value Date   HGBA1C 6.3* 05/16/2011   Cardiac Panel (last 3 results) No results found for this basename: CKTOTAL:3,CKMB:3,TROPONINI:3,RELINDX:3 in the last 72 hours Urinalysis    Component Value Date/Time   COLORURINE YELLOW 05/16/2011 1047   APPEARANCEUR CLEAR 05/16/2011 1047   LABSPEC 1.009 05/16/2011 1047   PHURINE 7.0 05/16/2011 1047   GLUCOSEU NEGATIVE 05/16/2011 1047   HGBUR MODERATE* 05/16/2011 1047   BILIRUBINUR NEGATIVE 05/16/2011 1047   KETONESUR NEGATIVE 05/16/2011 1047   PROTEINUR NEGATIVE 05/16/2011 1047    UROBILINOGEN 1.0 05/16/2011 1047   NITRITE NEGATIVE 05/16/2011 1047   LEUKOCYTESUR NEGATIVE 05/16/2011 1047   Urine Drug Screen  No results found for this basename: labopia,  cocainscrnur,  labbenz,  amphetmu,  thcu,  labbarb    Alcohol Level No results found for this basename: eth   CT of the brain  05/18/2011  Small acute non hemorrhagic mid to posterior right frontal lobe infarct.  MRI of the brain  Pacemaker   MRA of the brain  pacemaker  2D Echocardiogram  EF 65-70% with no source of embolus.   Carotid Doppler  No ICA stenosis. Vertebral artery flow is antegrade.  CXR    EKG  Ventricular paced rhythm .   Physical Exam    Pleasant elderly caucasian lady not in distress.Awake alert.  Exam limited today as she is bathing. Neurologic Exam :Awake alert oriented x 3 normal speech and language. No face asymmetry. Exam limited as she is bathing.   ASSESSMENT [name] is a 78 y.o. female with a subcortical right cerebral infarction secondary to atrial fibrillation. On warfarin prior to admission; INR on arrival 1.71 Now on warfarin for secondary stroke prevention. INR therapeutic.  Patient with resultant left hemiparesis and incoordination, ataxia. Therapies recommend IP rehab.  Afib- on coumadin but suboptimal INR on admission HTN- controlled  A1C - 6.3- borderline DM  Hospital day # 3  TREATMENT/PLAN -Continue warfarin for secondary stroke prevention. -rehab when bed available -D/w patient -Stroke Service will sign off. Follow up with Dr. Pearlean Brownie in 2 months.  Joaquin Music, ANP-BC, GNP-BC [facility] Pager: [phone] 05/19/2011 8:41 AM  Dr. Delia Heady, Stroke Center Medical Director, has personally reviewed chart, pertinent data, examined the patient and developed the plan of care.

## 2011-05-19 NOTE — Op Note (Signed)
[facility] [address] [place], [place]  [address]  ENDOSCOPY PROCEDURE REPORT  PATIENT:  [name], [name]  MR#:  [id number] BIRTHDATE:  [birth year], 88 yrs. old  GENDER:  female  ENDOSCOPIST:  Charlott Rakes, MD Referred by:  Estill Batten. Deirdre Priest, M.D.  PROCEDURE DATE:  05/19/2011 PROCEDURE:  EGD, diagnostic 43235 ASA CLASS:  Class III INDICATIONS:  hemorrhage of GI tract  MEDICATIONS:   Fentanyl 25 mcg IV, Versed 3 mg IV, Cetacaine spray x 2  TOPICAL ANESTHETIC:  DESCRIPTION OF PROCEDURE:   After the risks benefits and alternatives of the procedure were thoroughly explained, informed consent was obtained.  The Pentax Gastroscope [id number] endoscope was introduced through the mouth and advanced to the second portion of the duodenum, without limitations.  The instrument was slowly withdrawn as the mucosa was fully examined. <<PROCEDUREIMAGES>>  FINDINGS:  The endoscope was inserted into the oropharynx and esophagus was intubated.  The gastroesophageal junction was unremarkable. The endoscope was advanced into the stomach, which revealed a few small nodules in the antrum with small clean-based ulcerations seen on the top of these nodules. The remaining mucosa of the stomach was unrevealing. Retroflexion revealed small hiatal hernia. Endoscope was advanced into the duodenal bulb and second portion of duodenum, which were both unremarkable. Clear bilious fluid seen in the duodenum. The scope was then withdrawn back to the stomach and then back into the esophagus. The esophagus was normal in its entirety.  COMPLICATIONS:  None  ENDOSCOPIC IMPRESSION:  1. Small benign-appearing antral ulcerations without any bleeding stigmata 2. Small hiatal hernia 3. No blood products or recent bleeding seen 4. Suspect gastric findings unrelated to her melena. Recent epistaxis could be the source of the recent melena  RECOMMENDATIONS:    1. Resume previous  diet 2. Check H. pylori serology and treat if positive  REPEAT EXAM:  N/A  ______________________________ Charlott Rakes, MD  CC:  Oda Cogan Md  n. eSIGNEDCharlott Rakes at 05/19/2011 03:59 PM  [name], [phone]

## 2011-05-19 NOTE — Progress Notes (Signed)
Physical Therapy Treatment Patient Details Name: [name] MRN: [id number] DOB: [birth year] Today's Date: 05/19/2011 Time: 1115-1140 PT Time Calculation (min): 25 min  PT Assessment / Plan / Recommendation Comments on Treatment Session  Pt is s/p Rt frontal CVA with Lt sided weakness and decreased safety judgment. Pt very eager to get going with rehab, asking for activities she can be doing in her room. Pt reminded not to get up without assist, but given open-chain LLE ex that would be suitable. Tx focused on static and dynamic balance tasks as well as gait with RW.     Follow Up Recommendations  Inpatient Rehab    Equipment Recommendations  Defer to next venue    Frequency     Plan Discharge plan remains appropriate;Frequency remains appropriate    Precautions / Restrictions Precautions Precautions: Fall Restrictions Weight Bearing Restrictions: No   Pertinent Vitals/Pain No complaints    Mobility  Bed Mobility Bed Mobility: Sit to Supine Sit to Supine: 4: Min assist;HOB flat Details for Bed Mobility Assistance: assist to lift LLE onto bed Transfers Sit to Stand: 4: Min assist;With upper extremity assist;With armrests;From chair/3-in-1;From toilet;Other (comment) (with grab bar at toilet) Stand to Sit: 4: Min assist;With upper extremity assist;With armrests;To chair/3-in-1;To toilet Details for Transfer Assistance: Performed sit<>stand without UE assist for strengthening, but had pt use UEs for regular transfers due to safety with L LE weakness.  During sit>stand, pt leans to L due to weakness, needing assit for steadying Ambulation/Gait Ambulation/Gait Assistance: 3: Mod assist;4: Min assist Ambulation Distance (Feet): 120 Feet Assistive device: Rolling walker Ambulation/Gait Assistance Details: Min A needed for steadying, especially during turns. Instructed pt in safe turning with RW management. Pt continues to demonstrate decreased L foot clearance unless she uses  steppage gait to compensate. Pt using good hell strike this tx, but continues to have limited knee ext in stance. Posture improved, but pt still with downward gaze to monitor progress with quality.  Gait Pattern: Step-through pattern;Left steppage;Lateral hip instability Stairs: No Modified Rankin (Stroke Patients Only) Modified Rankin: Moderately severe disability    Exercises Total Joint Exercises Long Arc Quad: AROM;Left;10 reps;Seated (+10 with 3 sec holds) Low Level/ICU Exercises Ankle Circles/Pumps: AROM;Left;20 reps;Seated Other Exercises Other Exercises: sit to stand from chair x5 with min assist; hands on knees   PT Goals Acute Rehab PT Goals PT Goal: Sit to Stand - Progress: Progressing toward goal PT Goal: Stand to Sit - Progress: Progressing toward goal PT Goal: Stand - Progress: Progressing toward goal PT Goal: Ambulate - Progress: Progressing toward goal  Visit Information  Last PT Received On: 05/19/11 Assistance Needed: +1    Subjective Data  Subjective: What exercise can I do on my own here?    Cognition  Overall Cognitive Status: Appears within functional limits for tasks assessed/performed Area of Impairment: Safety/judgement;Awareness of deficits Arousal/Alertness: Awake/alert Orientation Level: Appears intact for tasks assessed Behavior During Session: [facility] for tasks performed (Pt continues to demonstrate some impulsivity, but improving) Safety/Judgement: Decreased awareness of safety precautions;Decreased safety judgement for tasks assessed;Impulsive Safety/Judgement - Other Comments: Pt reminded to wait for staff for mobility Awareness of Deficits: Pt with increasing awareness of deficits, but not safe to transfer independently Cognition - Other Comments: Pt makes comments throughout tx as though she understands safety needs, but difficult to follow due to high level of independence    Balance  Balance Balance Assessed: Yes Dynamic Standing  Balance Dynamic Standing - Balance Support: No upper extremity supported Dynamic Standing -  Level of Assistance: 3: Mod assist Dynamic Standing - Balance Activities: Lateral lean/weight shifting;Forward lean/weight shifting;Reaching across midline Dynamic Standing - Comments: Pt with decreased stability on L while reaching in all directions. 1 LOB, but pt with good reactions, able to recover with Mod A High Level Balance High Level Balance Activites: Side stepping;Backward walking;Turns;Direction changes High Level Balance Comments: Performed all conditions x10' with Mod A due to LOB and decreased L foot clearance.   End of Session PT - End of Session Equipment Utilized During Treatment: Gait belt Activity Tolerance: Patient tolerated treatment well Patient left: in chair;with call bell/phone within reach;with family/visitor present Nurse Communication: Mobility status    Virl Cagey, [place] [phone]  05/19/2011, 1:15 PM

## 2011-05-19 NOTE — Progress Notes (Signed)
Stroke Team Progress Note  HISTORY [name] is an 78 y.o. female with a history of atrial fibrillation, hypertension, pacemaker placement and heart valvular disease, presenting with new onset left lower extremity weakness as well as coordination difficulty of left hand. Patient woke up with these symptoms around 4 AM 05/16/2011. She was last known normal at bedtime on 05/15/2011. She took a nap after waking up this morning and had worsening of her left lower extremity weakness when she woke up. Strength has improved slightly since she arrived in the emergency room. CT scan of the head showed no acute intracranial abnormality. MRI study could not be obtained because patient has pacemaker. NIH stroke score was 4. She is on Coumadin and has an INR of 1.71 today. Patient was not a TPA candidate secondary to delay in arrival. She was admitted for further evaluation and treatment.  SUBJECTIVE Patient currently bathing. Carotid dopplers no stenosis. INR is optimal today.  OBJECTIVE Most recent Vital Signs: Filed Vitals:   05/18/11 2000 05/19/11 0000 05/19/11 0400 05/19/11 0800  BP: 138/72 126/60 98/56 120/60  Pulse: 76 56 67 64  Temp: 98.7 F (37.1 C) 98.6 F (37 C) 98 F (36.7 C) 98 F (36.7 C)  TempSrc:  Oral Oral Oral  Resp: 16 18 18 18  Height:      Weight:      SpO2: 95% 96% 93% 97%   CBG (last 3)  No results found for this basename: GLUCAP:3 in the last 72 hours Intake/Output from previous day: 04/22 0701 - 04/23 0700 In: 720 [P.O.:720] Out: 200 [Urine:200]  IV Fluid Intake:     MEDICATIONS    . digoxin  0.125 mg Oral Daily  . sodium chloride  3 mL Intravenous Q12H  . torsemide  20 mg Oral Daily  . verapamil  180 mg Oral QHS  . Warfarin - Pharmacist Dosing Inpatient   Does not apply q1800  . DISCONTD: polyethylene glycol  17 g Oral Daily  . DISCONTD: verapamil  240 mg Oral QHS  . DISCONTD: warfarin  5 mg Oral ONCE-1800   PRN:  sodium chloride, bisacodyl, polyethylene  glycol, sodium chloride, traZODone  Diet:  Cardiac thin liquids Activity:  Up with assistance DVT Prophylaxis:  coumadin  CLINICALLY SIGNIFICANT STUDIES CBC    Component Value Date/Time   WBC 7.0 05/19/2011 0546   RBC 4.00 05/19/2011 0546   HGB 11.7* 05/19/2011 0546   HCT 36.0 05/19/2011 0546   PLT 268 05/19/2011 0546   MCV 90.0 05/19/2011 0546   MCH 29.3 05/19/2011 0546   MCHC 32.5 05/19/2011 0546   RDW 13.8 05/19/2011 0546   LYMPHSABS 1.2 05/16/2011 1000   MONOABS 0.8 05/16/2011 1000   EOSABS 0.1 05/16/2011 1000   BASOSABS 0.0 05/16/2011 1000   CMP    Component Value Date/Time   NA 139 05/19/2011 0546   K 3.7 05/19/2011 0546   CL 100 05/19/2011 0546   CO2 28 05/19/2011 0546   GLUCOSE 132* 05/19/2011 0546   BUN 23 05/19/2011 0546   CREATININE 0.83 05/19/2011 0546   CALCIUM 9.0 05/19/2011 0546   PROT 6.6 05/17/2011 0920   ALBUMIN 3.6 05/17/2011 0920   AST 20 05/17/2011 0920   ALT 17 05/17/2011 0920   ALKPHOS 79 05/17/2011 0920   BILITOT 0.8 05/17/2011 0920   GFRNONAA 62* 05/19/2011 0546   GFRAA 72* 05/19/2011 0546   COAGS Lab Results  Component Value Date   INR 2.13* 05/19/2011   INR 1.85* 05/18/2011     INR 1.73* 05/17/2011   Lipid Panel    Component Value Date/Time   CHOL 141 05/17/2011 0920   TRIG 107 05/17/2011 0920   HDL 30* 05/17/2011 0920   CHOLHDL 4.7 05/17/2011 0920   VLDL 21 05/17/2011 0920   LDLCALC 90 05/17/2011 0920   HgbA1C  Lab Results  Component Value Date   HGBA1C 6.3* 05/16/2011   Cardiac Panel (last 3 results) No results found for this basename: CKTOTAL:3,CKMB:3,TROPONINI:3,RELINDX:3 in the last 72 hours Urinalysis    Component Value Date/Time   COLORURINE YELLOW 05/16/2011 1047   APPEARANCEUR CLEAR 05/16/2011 1047   LABSPEC 1.009 05/16/2011 1047   PHURINE 7.0 05/16/2011 1047   GLUCOSEU NEGATIVE 05/16/2011 1047   HGBUR MODERATE* 05/16/2011 1047   BILIRUBINUR NEGATIVE 05/16/2011 1047   KETONESUR NEGATIVE 05/16/2011 1047   PROTEINUR NEGATIVE 05/16/2011 1047    UROBILINOGEN 1.0 05/16/2011 1047   NITRITE NEGATIVE 05/16/2011 1047   LEUKOCYTESUR NEGATIVE 05/16/2011 1047   Urine Drug Screen  No results found for this basename: labopia,  cocainscrnur,  labbenz,  amphetmu,  thcu,  labbarb    Alcohol Level No results found for this basename: eth   CT of the brain  05/18/2011  Small acute non hemorrhagic mid to posterior right frontal lobe infarct.  MRI of the brain  Pacemaker   MRA of the brain  pacemaker  2D Echocardiogram  EF 65-70% with no source of embolus.   Carotid Doppler  No ICA stenosis. Vertebral artery flow is antegrade.  CXR    EKG  Ventricular paced rhythm .   Physical Exam    Pleasant elderly caucasian lady not in distress.Awake alert.  Exam limited today as she is bathing. Neurologic Exam :Awake alert oriented x 3 normal speech and language. No face asymmetry. Exam limited as she is bathing.   ASSESSMENT [name] is a 78 y.o. female with a subcortical right cerebral infarction secondary to atrial fibrillation. On warfarin prior to admission; INR on arrival 1.71 Now on warfarin for secondary stroke prevention. INR therapeutic.  Patient with resultant left hemiparesis and incoordination, ataxia. Therapies recommend IP rehab.  Afib- on coumadin but suboptimal INR on admission HTN- controlled  A1C - 6.3- borderline DM  Hospital day # 3  TREATMENT/PLAN -Continue warfarin for secondary stroke prevention. -rehab when bed available -D/w patient -Stroke Service will sign off. Follow up with Dr. Haide Klinker in 2 months.  SHARON BIBY, AVNP, ANP-BC, GNP-BC  Stroke Center Pager: [phone] 05/19/2011 8:41 AM  Dr. Angelyn Osterberg, Stroke Center Medical Director, has personally reviewed chart, pertinent data, examined the patient and developed the plan of care.    

## 2011-05-19 NOTE — Interval H&P Note (Signed)
History and Physical Interval Note:  05/19/2011 3:27 PM  [name]  has presented today for surgery, with the diagnosis of gib  The various methods of treatment have been discussed with the patient and family. After consideration of risks, benefits and other options for treatment, the patient has consented to  Procedure(s) (LRB): ESOPHAGOGASTRODUODENOSCOPY (EGD) (N/A) as a surgical intervention .  The patients' history has been reviewed, patient examined, no change in status, stable for surgery.  I have reviewed the patients' chart and labs.  Questions were answered to the patient's satisfaction.     Mekel Haverstock C.

## 2011-05-19 NOTE — Progress Notes (Signed)
PGY-1 Daily Progress Note Family Medicine Teaching Service Amber M. Hairford, MD Service Pager: [phone]  Subjective: Patient states she is overall feeling better. She continues to have weakness of her left lower extremity but it has improved. She is able to ambulate with a walker (not used prior to hospitalization). Now reporting maroon colored stools.  States that she hasn't had this much blood in her stool since her previous GI bleed.  No dizziness, SOB, or CP.  Objective: Vital signs in last 24 hours: Temp:  [98 F (36.7 C)-98.7 F (37.1 C)] 98 F (36.7 C) (04/23 0800) Pulse Rate:  [56-81] 64  (04/23 0800) Resp:  [16-18] 18  (04/23 0800) BP: (98-138)/(56-75) 120/60 mmHg (04/23 0800) SpO2:  [93 %-97 %] 97 % (04/23 0800) Weight change:  Last BM Date: 05/18/11  Intake/Output from previous day: 04/22 0701 - 04/23 0700 In: 720 [P.O.:720] Out: 200 [Urine:200] Intake/Output this shift:    Physical Exam: General appearance: alert, cooperative and no distress. Sitting in chair Lungs: clear to auscultation bilaterally  Heart: regular rate and rhythm, S1, S2 normal and systolic murmur: holosystolic 2/6,  Abdomen: soft, non-tender; bowel sounds normal Extremities: 1+ edema, R>L Neurologic: Mental status: Alert, oriented, thought content appropriate  Cranial nerves: normal  Sensory: Decreased sensation on extremities b/l, L>R  Motor: Strength in LLE 4/5, can raise leg to almost 45 degree angle. RLE 5/5, RUE 5/5, LUE 5/5  Lab Results:  Basename 05/19/11 0546 05/18/11 1643  WBC 7.0 7.8  HGB 11.7* 12.7  HCT 36.0 40.0  PLT 268 308   BMET  Basename 05/19/11 0546 05/18/11 0620  NA 139 139  K 3.7 3.8  CL 100 99  CO2 28 30  GLUCOSE 132* 143*  BUN 23 20  CREATININE 0.83 0.86  CALCIUM 9.0 8.8   A1C 6.3 TSH 2.471 LDL 90 Carotid dopplers wnl  INR 2.13  Studies/Results: Ct Head Wo Contrast  05/18/2011  *RADIOLOGY REPORT*  Clinical Data: Persistent left-sided leg  weakness.  CT HEAD WITHOUT CONTRAST  Technique:  Contiguous axial images were obtained from the base of the skull through the vertex without contrast.  Comparison: 05/16/2011.  Findings: No intracranial hemorrhage.  Now visualized is a small acute non hemorrhagic mid to posterior right frontal lobe infarct.  Moderate small vessel disease type changes.  Global atrophy without hydrocephalus.  No intracranial mass lesion detected on this unenhanced exam.  Vascular calcifications.  IMPRESSION: Small acute non hemorrhagic mid to posterior right frontal lobe infarct.  Please see above.  Original Report Authenticated By: Fuller Canada, M.D.    Medications:  I have reviewed the patient's current medications. Scheduled:    . digoxin  0.125 mg Oral Daily  . sodium chloride  3 mL Intravenous Q12H  . torsemide  20 mg Oral Daily  . verapamil  180 mg Oral QHS  . Warfarin - Pharmacist Dosing Inpatient   Does not apply q1800  . DISCONTD: polyethylene glycol  17 g Oral Daily  . DISCONTD: verapamil  240 mg Oral QHS  . DISCONTD: warfarin  5 mg Oral ONCE-1800   Continuous:  AVW:[id number] chloride, bisacodyl, polyethylene glycol, sodium chloride, traZODone  Assessment/Plan: [name] with acute onset LLE weakness  1. Weakness- Improved to some degree. - Repeat CT scan shows frontal CVA - Await CIR consult - Continue PT/OT  2. Hemoccult positive stools: Concern for GI bleed and on coumadin.  Hemoglobin has been around 12 this admission. - consult GI - recheck CBC at  1300 - hold coumadin - protonix 40mg  PO daily  2. Afib- Known history of atrial fibrillation. Followed by [facility] cardiology. Last echo was in April 2012. She does have a pacemaker as well - Rate controlled. Will decrease Verapamil to 180mg  qhs in setting of acute strok - Dig level supratherapeutic; currently holding Digoxin. Per her cardiology office she is taking 1.25mg  BID. Will restart this tomorrow at 1.25mg  daily. - Echo results  pending. If she has systolic dysfunction, will consider d/c Verapamil (and/or cards consult for further medication management) - INR goal per [facility] cards is 1.8-2.0. Patient was 1.71 on admission with CVA. Will increase her goal to 2.0-2.5 but not higher due to history of bleed. Current INR 2.1  3. HTN- BP stable. - Continue home medications as described above - Continue Torsemide daily - Continue to monitor  4. Constipation-BM x 2 since admission which she describes as tarry and FOBT positive - Continue miralax prn  and dulcolax prn  FEN/GI- Heart healthy diet   PPx- On Coumadin. No VTE ppx indicated  Dispo- Pending further work up. Awaiting CIR consult. Will have CSW see patient in case she is not a candidate.   LOS: 3 days   [name], Tayvon Culley 05/19/2011, 10:28 AM

## 2011-05-19 NOTE — Clinical Documentation Improvement (Signed)
GENERIC DOCUMENTATION CLARIFICATION QUERY  THIS DOCUMENT IS NOT A PERMANENT PART OF THE MEDICAL RECORD  TO RESPOND TO THE THIS QUERY, FOLLOW THE INSTRUCTIONS BELOW:  1. If needed, update documentation for the patient's encounter via the notes activity.  2. Access this query again and click edit on the In [employer].  3. After updating, or not, click F2 to complete all highlighted (required) fields concerning your review. Select "additional documentation in the medical record" OR "no additional documentation provided".  4. Click Sign note button.  5. The deficiency will fall out of your In Basket *Please let us know if you are not able to complete this workflow by phone or e-mail (listed below).  Please update your documentation within the medical record to reflect your response to this query.                                                                                        05/19/11   Dear [facility],  In a better effort to capture your patient's severity of illness, reflect appropriate length of stay and utilization of resources, a review of the patient medical record has revealed the following indicators.    Possible Clinical Conditions? - Left hemiparesis - Other Condition (please specify) - Cannot Clinically Determine  Supporting Information: - Risk Factors: Acute CVA - Signs & Symptoms: 4/21: Motor: 3-4/5 strength of proximal left lower extremity, and 4/5 strength distally; normal strength otherwise.  LUE drift. Sensory: slightly altered on left hemibody. Slight difficulty with finger-to-nose testing on the left.   You may use possible, probable, or suspect with inpatient documentation. possible, probable, suspected diagnoses MUST be documented at the time of discharge.  THANK YOU FOR RESPONDING TO THIS QUERY.  Reviewed: additional documentation in the medical record  Thank You,  Beverley Fiedler RN Clinical Documentation Specialist: [phone] Health  Information Management [facility] [place]  Done.  See addendum to DC summary

## 2011-05-19 NOTE — Op Note (Signed)
Small nodular ulcerations in antrum of stomach but no bleeding stigmata, blood products, or evidence of recent bleeding. Suspect bleeding was due to recent epistaxis and not from the antral findings. Resume heart healthy diet. IV PPI QD today and then PO BID tomorrow if stable. Will check for H. Pylori with serology.

## 2011-05-20 ENCOUNTER — Encounter (HOSPITAL_COMMUNITY): Payer: Self-pay | Admitting: Gastroenterology

## 2011-05-20 ENCOUNTER — Ambulatory Visit: Payer: Self-pay | Admitting: Pharmacist

## 2011-05-20 ENCOUNTER — Inpatient Hospital Stay (HOSPITAL_COMMUNITY)
Admission: RE | Admit: 2011-05-20 | Discharge: 2011-05-25 | DRG: 945 | Disposition: A | Discharge status: Home Health | Payer: Medicare Other | Source: Ambulatory Visit | Attending: Physical Medicine & Rehabilitation | Admitting: Physical Medicine & Rehabilitation

## 2011-05-20 DIAGNOSIS — A048 Other specified bacterial intestinal infections: Secondary | ICD-10-CM

## 2011-05-20 DIAGNOSIS — E669 Obesity, unspecified: Secondary | ICD-10-CM

## 2011-05-20 DIAGNOSIS — Z5189 Encounter for other specified aftercare: Principal | ICD-10-CM

## 2011-05-20 DIAGNOSIS — Z79899 Other long term (current) drug therapy: Secondary | ICD-10-CM

## 2011-05-20 DIAGNOSIS — G8190 Hemiplegia, unspecified affecting unspecified side: Secondary | ICD-10-CM

## 2011-05-20 DIAGNOSIS — R040 Epistaxis: Secondary | ICD-10-CM

## 2011-05-20 DIAGNOSIS — K259 Gastric ulcer, unspecified as acute or chronic, without hemorrhage or perforation: Secondary | ICD-10-CM

## 2011-05-20 DIAGNOSIS — I495 Sick sinus syndrome: Secondary | ICD-10-CM

## 2011-05-20 DIAGNOSIS — I872 Venous insufficiency (chronic) (peripheral): Secondary | ICD-10-CM

## 2011-05-20 DIAGNOSIS — X58XXXA Exposure to other specified factors, initial encounter: Secondary | ICD-10-CM

## 2011-05-20 DIAGNOSIS — G4730 Sleep apnea, unspecified: Secondary | ICD-10-CM

## 2011-05-20 DIAGNOSIS — I4891 Unspecified atrial fibrillation: Secondary | ICD-10-CM

## 2011-05-20 DIAGNOSIS — Z950 Presence of cardiac pacemaker: Secondary | ICD-10-CM

## 2011-05-20 DIAGNOSIS — S838X9A Sprain of other specified parts of unspecified knee, initial encounter: Secondary | ICD-10-CM

## 2011-05-20 DIAGNOSIS — I6350 Cerebral infarction due to unspecified occlusion or stenosis of unspecified cerebral artery: Secondary | ICD-10-CM

## 2011-05-20 DIAGNOSIS — I10 Essential (primary) hypertension: Secondary | ICD-10-CM

## 2011-05-20 DIAGNOSIS — Z7901 Long term (current) use of anticoagulants: Secondary | ICD-10-CM

## 2011-05-20 DIAGNOSIS — R609 Edema, unspecified: Secondary | ICD-10-CM

## 2011-05-20 DIAGNOSIS — I639 Cerebral infarction, unspecified: Secondary | ICD-10-CM

## 2011-05-20 DIAGNOSIS — I6330 Cerebral infarction due to thrombosis of unspecified cerebral artery: Secondary | ICD-10-CM

## 2011-05-20 DIAGNOSIS — K5900 Constipation, unspecified: Secondary | ICD-10-CM

## 2011-05-20 LAB — PROTIME-INR: Prothrombin Time: 21.8 seconds — ABNORMAL HIGH (ref 11.6–15.2)

## 2011-05-20 LAB — CBC
HCT: 34.8 % — ABNORMAL LOW (ref 36.0–46.0)
Hemoglobin: 11.4 g/dL — ABNORMAL LOW (ref 12.0–15.0)
MCH: 29.7 pg (ref 26.0–34.0)
RBC: 3.84 MIL/uL — ABNORMAL LOW (ref 3.87–5.11)

## 2011-05-20 MED ORDER — PANTOPRAZOLE SODIUM 40 MG PO TBEC: 40.0000 mg | DELAYED_RELEASE_TABLET | Freq: Two times a day (BID) | ORAL | Status: DC

## 2011-05-20 MED ORDER — TRAZODONE HCL 50 MG PO TABS: 50.0000 mg | ORAL_TABLET | Freq: Every evening | ORAL | Status: DC | PRN

## 2011-05-20 MED ORDER — POLYETHYLENE GLYCOL 3350 17 G PO PACK
17.0000 g | PACK | Freq: Every day | ORAL | Status: DC | PRN
  Filled 2011-05-20: qty 1

## 2011-05-20 MED ORDER — VERAPAMIL HCL ER 180 MG PO TBCR
180.0000 mg | EXTENDED_RELEASE_TABLET | Freq: Every day | ORAL | Status: DC
  Administered 2011-05-20 – 2011-05-24 (×5): 180 mg via ORAL
  Filled 2011-05-20 (×6): qty 1

## 2011-05-20 MED ORDER — TORSEMIDE 20 MG PO TABS: 20.0000 mg | ORAL_TABLET | Freq: Every day | ORAL | Status: DC

## 2011-05-20 MED ORDER — PANTOPRAZOLE SODIUM 40 MG PO TBEC
40.0000 mg | DELAYED_RELEASE_TABLET | Freq: Every day | ORAL | Status: DC
  Administered 2011-05-20 – 2011-05-24 (×5): 40 mg via ORAL
  Filled 2011-05-20 (×4): qty 1

## 2011-05-20 MED ORDER — SENNOSIDES-DOCUSATE SODIUM 8.6-50 MG PO TABS: 1.0000 | ORAL_TABLET | Freq: Every evening | ORAL | Status: DC | PRN

## 2011-05-20 MED ORDER — DIGOXIN 125 MCG PO TABS
0.1250 mg | ORAL_TABLET | Freq: Every day | ORAL | Status: DC
  Administered 2011-05-21 – 2011-05-25 (×5): 0.125 mg via ORAL
  Filled 2011-05-20 (×7): qty 1

## 2011-05-20 MED ORDER — TORSEMIDE 20 MG PO TABS
20.0000 mg | ORAL_TABLET | Freq: Every day | ORAL | Status: DC
  Administered 2011-05-21 – 2011-05-25 (×6): 20 mg via ORAL
  Filled 2011-05-20 (×7): qty 1

## 2011-05-20 MED ORDER — WARFARIN - PHARMACIST DOSING INPATIENT: 1.0000 | Freq: Every day | Status: DC

## 2011-05-20 MED ORDER — POLYETHYLENE GLYCOL 3350 17 G PO PACK: 17.0000 g | PACK | Freq: Every day | ORAL | Status: AC | PRN

## 2011-05-20 MED ORDER — ACETAMINOPHEN 325 MG PO TABS: 325.0000 mg | ORAL_TABLET | ORAL | Status: DC | PRN

## 2011-05-20 MED ORDER — SORBITOL 70 % SOLN
30.0000 mL | Freq: Every day | Status: DC | PRN
  Administered 2011-05-21 – 2011-05-23 (×2): 30 mL via ORAL
  Filled 2011-05-20 (×2): qty 30

## 2011-05-20 MED ORDER — WARFARIN - PHARMACIST DOSING INPATIENT
Freq: Every day | Status: DC
  Administered 2011-05-24: 18:00:00

## 2011-05-20 MED ORDER — DIGOXIN 125 MCG PO TABS: 0.1250 mg | ORAL_TABLET | Freq: Every day | ORAL | Status: DC

## 2011-05-20 MED ORDER — WARFARIN SODIUM 5 MG PO TABS
5.0000 mg | ORAL_TABLET | Freq: Once | ORAL | Status: AC
  Administered 2011-05-20: 5 mg via ORAL
  Filled 2011-05-20: qty 1

## 2011-05-20 MED ORDER — VERAPAMIL HCL ER 180 MG PO TBCR: 180.0000 mg | EXTENDED_RELEASE_TABLET | Freq: Every day | ORAL | Status: DC

## 2011-05-20 NOTE — Progress Notes (Signed)
Overall Plan of Care [facility]) Patient Details Name: [name] MRN: [id number] DOB: Dec 05, 1923  Diagnosis:   Rehabilitation for right frontal infarction  Primary Diagnosis:    CVA (cerebral infarction) Co-morbidities:  Pericardial effusion      Chronic. Last echo in April of 2012.   .  Atrial fibrillation      Chronic   .  Chronic anticoagulation      on Coumadin   .  History of GI bleed    .  HTN (hypertension)    .  Sleep apnea    .  Pacemaker      placed for tachybrady.   .  Valvular heart disease    .  Obesity    .  LVH (left ventricular hypertrophy)       Functional Problem List  Patient demonstrates impairments in the following areas: Balance, Motor, Pain, Safety and Sensory   Basic ADL's: bathing, dressing and toileting Advanced ADL's: simple meal preparation and light housekeeping  Transfers:  bed mobility, bed to chair, toilet, tub/shower, car and floor Locomotion:  ambulation and stairs  Additional Impairments:  None  Anticipated Outcomes Item Anticipated Outcome  Eating/Swallowing    Basic self-care  Mod I  Tolieting  Mod I  Bowel/Bladder  Mod I  Transfers  independent  Locomotion  Mod I  Communication    Cognition    Pain  Less than 3 out of 10 on pain scale   Safety/Judgment  Mod I  Other     Therapy Plan: PT Frequency: 1-2 X/day, 60-90 minutes;5 out of 7 days OT Frequency: 1-2 X/day, 60-90 minutes;5 out of 7 days     Team Interventions: Item RN PT OT SLP SW TR Other  Self Care/Advanced ADL Retraining   x      Neuromuscular Re-Education  x x      Therapeutic Activities  x x      UE/LE Strength Training/ROM  x x      UE/LE Coordination Activities  x x      Visual/Perceptual Remediation/Compensation         DME/Adaptive Equipment Instruction  x x      Therapeutic Exercise  x x      Balance/Vestibular Training  x x      Patient/Family Education x x x      Cognitive Remediation/Compensation         Functional Mobility  Training  x x      Ambulation/Gait Training  x       Furniture conservator/restorer Reintegration   x      Dysphagia/Aspiration Landscape architect Facilitation         Bladder Management x        Bowel Management x        Disease Management/Prevention         Pain Management x        Medication Management x        Skin Care/Wound Management x        Splinting/Orthotics         Discharge Planning x x x      Psychosocial Support  Team Discharge Planning: Destination:  Home Projected Follow-up:  PT and Outpatient Projected Equipment Needs:  TBD as patient progresses Patient/family involved in discharge planning:  Yes  MD ELOS:  2 weeks Medical Rehab Prognosis:  Good Assessment:  78 year old female admitted with left lower extremity greater than the upper  weakness. She now requires CIR level PT, OT , 24 7 rehabilitation RN and M.D.

## 2011-05-20 NOTE — H&P (Signed)
Physical Medicine and Rehabilitation Admission H&P    Chief Complaint  Patient presents with  . Weakness  : HPI: 78 year old right-handed female with history of atrial fibrillation/pacemaker with chronic Coumadin therapy. Admitted April 20 left side weakness. 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 MRI could not be completed due to pacemaker. Carotid Dopplers with no ICA stenosis. Echocardiogram with ejection fraction of 70% without emboli. Neurology consulted she remains on Coumadin therapy with admission INR 1.71. Gastroenterology consulted April 23 Dr.Schooler for maroon stool x4 without associated abdominal pain or nausea. There was also reported nosebleed April 18 that resolved on its own without recurrence. Underwent an endoscopy 05/19/2011 showing small benign appearing antral ulcerations without any bleeding. No blood products or recent bleeding seen. Suspect gastric findings unrelated to her melena. Felt recent epistaxis  could be the source of the recent melena. Placed on Protonix 40 mg daily. It was advised to continue Coumadin therapy. Physical therapy and occupational therapy  requested physical medicine rehabilitation consult to consider inpatient rehabilitation services. Rehab services felt she would be appropriate for the inpatient setting.   Review of Systems  Cardiovascular: Positive for palpitations and leg swelling.  Musculoskeletal: Positive for joint pain.  Neurological: Positive for dizziness.  All other systems reviewed and are negative   Past Medical History  Diagnosis Date  . Pericardial effusion     Chronic. Last echo in April of 2012.   . Atrial fibrillation     Chronic  . Chronic anticoagulation     on Coumadin  . History of GI bleed   . HTN (hypertension)   . Sleep apnea   . Pacemaker     placed for tachybrady.   . Valvular heart disease   . Obesity   . LVH (left ventricular hypertrophy)    Past  Surgical History  Procedure Date  . Pacemaker insertion     Last generator in 2006  . Breast lumpectomy     bilaterally  . Tonsillectomy   . Adenoidectomy   . Esophagogastroduodenoscopy 05/19/2011    Procedure: ESOPHAGOGASTRODUODENOSCOPY (EGD);  Surgeon: Shirley Friar, MD;  Location: [facility] ENDOSCOPY;  Service: Endoscopy;  Laterality: N/A;  doctor aware of inr   will try to be here no later than 230   Family History  Problem Relation Age of Onset  . Stroke Mother   . Stroke Father   . Pneumonia Father    Social History:  reports that she has never smoked. She has never used smokeless tobacco. She reports that she does not drink alcohol or use illicit drugs. Allergies: No Known Allergies Medications Prior to Admission  Medication Sig Dispense Refill  . digoxin (LANOXIN) 0.125 MG tablet Take 125 mcg by mouth 2 (two) times daily. Taking one tablet in the morning and a one of a tablet in the evening      . diphenhydrAMINE (BENADRYL) 25 MG tablet Take 25 mg by mouth every 6 (six) hours as needed. Sleep      . multivitamin (THERAGRAN) per tablet Take 1 tablet by mouth daily.        . verapamil (CALAN-SR) 240 MG CR tablet Take 240 mg by mouth at bedtime.      Marland Kitchen warfarin (COUMADIN) 5 MG tablet Take 2.5-5 mg by mouth. Currently taking 5mg  5 days a week. and 2.5mg  on monday and Friday.      . torsemide (DEMADEX) 20 MG tablet Take 1 tablet (20 mg total) by mouth  daily.  30 tablet  11    Home: Home Living Lives With: Alone Available Help at Discharge: Family;Available 24 hours/day (daughter and son-in-law can arrange 24 hour assist) Type of Home: House Home Access: Stairs to enter [employer] of Steps: 2 Entrance Stairs-Rails: Right Home Layout: Two level;Bed/bath upstairs;Full bath on main level Alternate Level Stairs-Number of Steps: 12 Alternate Level Stairs-Rails: Right Bathroom Shower/Tub: Tub/shower unit;Curtain Bathroom Toilet: Standard Bathroom Accessibility:  Yes How Accessible: Accessible via walker Home Adaptive Equipment: None (Gave away her BSC) Additional Comments: Pt's own house is 76 years old; Pt may go to daughter's home; will need specifics of daughter's home   Functional History: Prior Function Able to Take Stairs?: Yes Driving: Yes Vocation: Retired Comments: Extremely independent prior to admission; occasionally went up and down stairs for exercise  Functional Status:  Mobility: Bed Mobility Bed Mobility: Sit to Supine Sit to Supine: 4: Min assist;HOB flat Transfers Transfers: Sit to Stand;Stand to Sit Sit to Stand: 4: Min assist;With upper extremity assist;With armrests;From chair/3-in-1;From toilet;Other (comment) (with grab bar at toilet) Stand to Sit: 4: Min assist;With upper extremity assist;With armrests;To chair/3-in-1;To toilet Ambulation/Gait Ambulation/Gait Assistance: 3: Mod assist;4: Min assist Ambulation Distance (Feet): 120 Feet Assistive device: Rolling walker Ambulation/Gait Assistance Details: Min A needed for steadying, especially during turns. Instructed pt in safe turning with RW management. Pt continues to demonstrate decreased L foot clearance unless she uses steppage gait to compensate. Pt using good hell strike this tx, but continues to have limited knee ext in stance. Posture improved, but pt still with downward gaze to monitor progress with quality.  Gait Pattern: Step-through pattern;Left steppage;Lateral hip instability Gait velocity: diminished Stairs: No    ADL: ADL Where Assessed - Eating/Feeding: Chair Grooming: Performed;Minimal assistance Where Assessed - Grooming: Standing at sink Upper Body Bathing: Performed;Minimal assistance Where Assessed - Upper Body Bathing: Standing at sink Lower Body Bathing: Performed;Moderate assistance Where Assessed - Lower Body Bathing: Standing at sink;Sit to stand from chair Upper Body Dressing: Performed;Minimal assistance Where Assessed - Upper Body  Dressing: Sit to stand from chair Lower Body Dressing: Performed;Moderate assistance Where Assessed - Lower Body Dressing: Sitting, chair;Standing;Sit to stand from chair Toilet Transfer: Minimal assistance Toilet Transfer Method: Ambulating;Other (comment) (with walker) Toilet Transfer Equipment: Comfort height toilet;Grab bars Toileting - Clothing Manipulation: Performed;Minimal assistance Where Assessed - Glass blower/designer Manipulation: Standing Toileting - Hygiene: Performed;Minimal assistance Where Assessed - Toileting Hygiene: Standing Tub/Shower Transfer: Not assessed Tub/Shower Transfer Method: Not assessed ADL Comments: Pt needed verbal cues to keep L knee straight during bathing in standing activity  Cognition: Cognition Overall Cognitive Status: Appears within functional limits for tasks assessed Arousal/Alertness: Awake/alert Orientation Level: Oriented X4 Cognition Overall Cognitive Status: Appears within functional limits for tasks assessed/performed Area of Impairment: Safety/judgement;Awareness of deficits Arousal/Alertness: Awake/alert Orientation Level: Appears intact for tasks assessed Behavior During Session: [facility] for tasks performed (Pt continues to demonstrate some impulsivity, but improving) Safety/Judgement: Decreased awareness of safety precautions;Decreased safety judgement for tasks assessed;Impulsive Safety/Judgement - Other Comments: Pt reminded to wait for staff for mobility Awareness of Deficits: Pt with increasing awareness of deficits, but not safe to transfer independently Cognition - Other Comments: Pt makes comments throughout tx as though she understands safety needs, but difficult to follow due to high level of independence   Blood pressure 104/58, pulse 60, temperature 98.3 F (36.8 C), temperature source Oral, resp. rate 18, height 5\' 5"  (1.651 m), weight 74.39 kg (164 lb), SpO2 92.00%. Physical Exam  Vitals reviewed.  Constitutional: She is  oriented to person, place, and time. She appears well-developed.  HENT:  Head: Normocephalic. PERRL, EOMI Neck: No thyromegaly present.  Cardiovascular:  Cardiac rate controlled, positive murmur. Reg rhythm.  Pulmonary/Chest: Clear to auscultation. No murmurs rubs or gallops Abdominal: She exhibits no distension. There is no tenderness.  Musculoskeletal: She exhibits no edema.  Neurological: She is alert and oriented to person, place, and time.  Skin: Skin is warm and dry.  Psychiatric: She has a normal mood and affect. Cognition quite appropriate motor strength is 5/5 in the right deltoid, biceps, triceps, grip, right hip flexor, knee extensor right ankle dorsiflexor and plantar flexor  On the left side she has 4+/5 in the left deltoid, biceps, triceps,grip 5.  3+ hip, 4/5 ADF/APF, 4/5 knee. Mild left pronator drift. Mild rhomberg  Sensation is intact to light touch in bilateral upper and bilateral lower extremities  Tone is normal in the right side and the left upper extremity. Tone is reduced in the left lower extremity  Cranial nerves show no evidence of facial droop no extraocular muscle weakness   Results for orders placed during the hospital encounter of 05/16/11 (from the past 48 hour(s))  CBC     Status: Normal   Collection Time   05/18/11  4:43 PM      Component Value Range Comment   WBC 7.8  4.0 - 10.5 (K/uL)    RBC 4.35  3.87 - 5.11 (MIL/uL)    Hemoglobin 12.7  12.0 - 15.0 (g/dL)    HCT 78.2  95.6 - 21.3 (%)    MCV 92.0  78.0 - 100.0 (fL)    MCH 29.2  26.0 - 34.0 (pg)    MCHC 31.8  30.0 - 36.0 (g/dL)    RDW 08.6  57.8 - 46.9 (%)    Platelets 308  150 - 400 (K/uL)   OCCULT BLOOD X 1 CARD TO LAB, STOOL     Status: Normal   Collection Time   05/18/11  7:03 PM      Component Value Range Comment   Fecal Occult Bld POSITIVE     PROTIME-INR     Status: Abnormal   Collection Time   05/19/11  5:46 AM      Component Value Range Comment   Prothrombin Time 24.2 (*) 11.6 - 15.2  (seconds)    INR 2.13 (*) 0.00 - 1.49    CBC     Status: Abnormal   Collection Time   05/19/11  5:46 AM      Component Value Range Comment   WBC 7.0  4.0 - 10.5 (K/uL)    RBC 4.00  3.87 - 5.11 (MIL/uL)    Hemoglobin 11.7 (*) 12.0 - 15.0 (g/dL)    HCT 62.9  52.8 - 41.3 (%)    MCV 90.0  78.0 - 100.0 (fL)    MCH 29.3  26.0 - 34.0 (pg)    MCHC 32.5  30.0 - 36.0 (g/dL)    RDW 24.4  01.0 - 27.2 (%)    Platelets 268  150 - 400 (K/uL)   BASIC METABOLIC PANEL     Status: Abnormal   Collection Time   05/19/11  5:46 AM      Component Value Range Comment   Sodium 139  135 - 145 (mEq/L)    Potassium 3.7  3.5 - 5.1 (mEq/L)    Chloride 100  96 - 112 (mEq/L)    CO2 28  19 - 32 (mEq/L)  Glucose, Bld 132 (*) 70 - 99 (mg/dL)    BUN 23  6 - 23 (mg/dL)    Creatinine, Ser 1.61  0.50 - 1.10 (mg/dL)    Calcium 9.0  8.4 - 10.5 (mg/dL)    GFR calc non Af Amer 62 (*) >90 (mL/min)    GFR calc Af Amer 72 (*) >90 (mL/min)   CBC     Status: Normal   Collection Time   05/19/11  1:47 PM      Component Value Range Comment   WBC 7.3  4.0 - 10.5 (K/uL)    RBC 4.42  3.87 - 5.11 (MIL/uL)    Hemoglobin 12.9  12.0 - 15.0 (g/dL)    HCT 09.6  04.5 - 40.9 (%)    MCV 89.6  78.0 - 100.0 (fL)    MCH 29.2  26.0 - 34.0 (pg)    MCHC 32.6  30.0 - 36.0 (g/dL)    RDW 81.1  91.4 - 78.2 (%)    Platelets 314  150 - 400 (K/uL)   PROTIME-INR     Status: Abnormal   Collection Time   05/20/11  6:35 AM      Component Value Range Comment   Prothrombin Time 21.8 (*) 11.6 - 15.2 (seconds)    INR 1.86 (*) 0.00 - 1.49    CBC     Status: Abnormal   Collection Time   05/20/11  6:35 AM      Component Value Range Comment   WBC 6.9  4.0 - 10.5 (K/uL)    RBC 3.84 (*) 3.87 - 5.11 (MIL/uL)    Hemoglobin 11.4 (*) 12.0 - 15.0 (g/dL)    HCT 95.6 (*) 21.3 - 46.0 (%)    MCV 90.6  78.0 - 100.0 (fL)    MCH 29.7  26.0 - 34.0 (pg)    MCHC 32.8  30.0 - 36.0 (g/dL)    RDW 08.6  57.8 - 46.9 (%)    Platelets 280  150 - 400 (K/uL)    No  results found.  Post Admission Physician Evaluation: 1. Functional deficits secondary  to right frontal lobe infarct. 2. Patient is admitted to receive collaborative, interdisciplinary care between the physiatrist, rehab nursing staff, and therapy team. 3. Patient's level of medical complexity and substantial therapy needs in context of that medical necessity cannot be provided at a lesser intensity of care such as a SNF. 4. Patient has experienced substantial functional loss from his/her baseline which was documented above under the "Functional History" and "Functional Status" headings.  Judging by the patient's diagnosis, physical exam, and functional history, the patient has potential for functional progress which will result in measurable gains while on inpatient rehab.  These gains will be of substantial and practical use upon discharge  in facilitating mobility and self-care at the household level. 5. Physiatrist will provide 24 hour management of medical needs as well as oversight of the therapy plan/treatment and provide guidance as appropriate regarding the interaction of the two. 6. 24 hour rehab nursing will assist with bladder management, bowel management, safety, skin/wound care, disease management, medication administration and patient education  and help integrate therapy concepts, techniques,education, etc. 7. PT will assess and treat for:  LES, ROM, balance, NMR,  fxnl mobility.  Goals are: mod I. 8. OT will assess and treat for: UES, ROM, ADL's, NMR, fxnl mobility, safety.   Goals are: mod I. 9. SLP will assess and treat for: not app 10. Case Management and Social Worker will  assess and treat for psychological issues and discharge planning. 11. Team conference will be held weekly to assess progress toward goals and to determine barriers to discharge. 12.  Patient will receive at least 3 hours of therapy per day at least 5 days per week. 13. ELOS and Prognosis: 1 week   excellent   Medical Problem List and Plan: 1. acute nonhemorrhagic mid to posterior right frontal lobe infarction 2. DVT Prophylaxis/Anticoagulation: Chronic Coumadin therapy. INR 1.86 05/20/2011. Monitor for any signs of bleeding 3. Hypertension/atrial fibrillation. Demadex 20 mg daily, verapamil 180 mg each bedtime and Lanoxin 0.25 mg daily. Monitor with increased mobility. Cardiac rate is controlled.  4. Tachybradycardia syndrome. History of pacemaker 2006. HR controlled at present  5. History of GI bleed. Recent maroon-colored stools. EGD without active bleeding. Protonic since initiated. Follow serial cbc's. Recent hgb 11.4 6. Epistaxis. Occurrence x1 on 05/14/2011. Will monitor.    Ivory Broad 05/20/2011,  11:45

## 2011-05-20 NOTE — Progress Notes (Signed)
Patient arrived from 3700 about 1500. Patient alert and oriented x3. Patient denies any pain. Patient oriented to unit and and given rehab booklet. Discussed safety plan with patient and patient watched safety video. Continue with plan of care. See Assessment for detailed report.

## 2011-05-20 NOTE — Discharge Instructions (Signed)
STROKE/TIA DISCHARGE INSTRUCTIONS SMOKING Cigarette smoking nearly doubles your risk of having a stroke & is the single most alterable risk factor  If you smoke or have smoked in the last 12 months, you are advised to quit smoking for your health.  Most of the excess cardiovascular risk related to smoking disappears within a year of stopping.  Ask you doctor about anti-smoking medications  [place] Quit Line: 1-800-QUIT NOW  Free Smoking Cessation Classes [phone]  CHOLESTEROL Know your levels; limit fat & cholesterol in your diet  Lipid Panel     Component Value Date/Time   CHOL 141 05/17/2011 0920   TRIG 107 05/17/2011 0920   HDL 30* 05/17/2011 0920   CHOLHDL 4.7 05/17/2011 0920   VLDL 21 05/17/2011 0920   LDLCALC 90 05/17/2011 0920      Many patients benefit from treatment even if their cholesterol is at goal.  Goal: Total Cholesterol (CHOL) less than 160  Goal:  Triglycerides (TRIG) less than 150  Goal:  HDL greater than 40  Goal:  LDL (LDLCALC) less than 100   BLOOD PRESSURE American Stroke Association blood pressure target is less that 120/80 mm/Hg  Your discharge blood pressure is:  BP: 127/63 mmHg  Monitor your blood pressure  Limit your salt and alcohol intake  Many individuals will require more than one medication for high blood pressure  DIABETES (A1c is a blood sugar average for last 3 months) 6.3 Goal HGBA1c is under 7% (HBGA1c is blood sugar average for last 3 months)  Diabetes: No known diagnosis of diabetes    Lab Results  Component Value Date   HGBA1C 6.3* 05/16/2011     Your HGBA1c can be lowered with medications, healthy diet, and exercise.  Check your blood sugar as directed by your physician  Call your physician if you experience unexplained or low blood sugars.  PHYSICAL ACTIVITY/REHABILITATION Goal is 30 minutes at least 4 days per week    Activity:   Increase activity slowly, Therapies:   {STROKE DC THERAPIES:22361}  Activity decreases your  risk of heart attack and stroke and makes your heart stronger.  It helps control your weight and blood pressure; helps you relax and can improve your mood.  Participate in a regular exercise program.  Talk with your doctor about the best form of exercise for you (dancing, walking, swimming, cycling).  DIET/WEIGHT Goal is to maintain a healthy weight  Your discharge diet is: Cardiac  Your height is:  Height: 5\' 5"  (165.1 cm) Your current weight is: Weight: 71.986 kg (158 lb 11.2 oz) Your Body Mass Index (BMI) is:  BMI (Calculated): 26.5   Following the type of diet specifically designed for you will help prevent another stroke.  Your goal weight range is:  ***  Your goal Body Mass Index (BMI) is 19-24.  Healthy food habits can help reduce 3 risk factors for stroke:  High cholesterol, hypertension, and excess weight.  RESOURCES Stroke/Support Group:  Call [phone]  they meet the 3rd Sunday of the month on the Rehab Unit at [facility], [place] ( no meetings June, July & Aug).  STROKE EDUCATION PROVIDED/REVIEWED AND GIVEN TO PATIENT Stroke warning signs and symptoms How to activate emergency medical system (call 911). Medications prescribed at discharge. Need for follow-up after discharge. Personal risk factors for stroke. Pneumonia vaccine given:   No Flu vaccine given:   No My questions have been answered, the writing is legible, and I understand these instructions.  I will adhere to  these goals & educational materials that have been provided to me after my discharge from the hospital.

## 2011-05-20 NOTE — H&P (Signed)
Physical Medicine and Rehabilitation Admission H&P  Chief Complaint   Patient presents with   .  Weakness   :  HPI: 78 year old right-handed female with history of atrial fibrillation/pacemaker with chronic Coumadin therapy. Admitted April 20 left side weakness. 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 MRI could not be completed due to pacemaker. Carotid Dopplers with no ICA stenosis. Echocardiogram with ejection fraction of 70% without emboli. Neurology consulted she remains on Coumadin therapy with admission INR 1.71. Gastroenterology consulted April 23 Dr.Schooler for maroon stool x4 without associated abdominal pain or nausea. There was also reported nosebleed April 18 that resolved on its own without recurrence. Underwent an endoscopy 05/19/2011 showing small benign appearing antral ulcerations without any bleeding. No blood products or recent bleeding seen. Suspect gastric findings unrelated to her melena. Felt recent epistaxis could be the source of the recent melena. Placed on Protonix 40 mg daily. It was advised to continue Coumadin therapy. Physical therapy and occupational therapy requested physical medicine rehabilitation consult to consider inpatient rehabilitation services. Rehab services felt she would be appropriate for the inpatient setting.  Review of Systems  Cardiovascular: Positive for palpitations and leg swelling.  Musculoskeletal: Positive for joint pain.  Neurological: Positive for dizziness.  All other systems reviewed and are negative  Past Medical History   Diagnosis  Date   .  Pericardial effusion      Chronic. Last echo in April of 2012.   .  Atrial fibrillation      Chronic   .  Chronic anticoagulation      on Coumadin   .  History of GI bleed    .  HTN (hypertension)    .  Sleep apnea    .  Pacemaker      placed for tachybrady.   .  Valvular heart disease    .  Obesity    .  LVH (left ventricular  hypertrophy)     Past Surgical History   Procedure  Date   .  Pacemaker insertion      Last generator in 2006   .  Breast lumpectomy      bilaterally   .  Tonsillectomy    .  Adenoidectomy    .  Esophagogastroduodenoscopy  05/19/2011     Procedure: ESOPHAGOGASTRODUODENOSCOPY (EGD); Surgeon: Shirley Friar, MD; Location: [facility] ENDOSCOPY; Service: Endoscopy; Laterality: N/A; doctor aware of inr will try to be here no later than 230    Family History   Problem  Relation  Age of Onset   .  Stroke  Mother    .  Stroke  Father    .  Pneumonia  Father     Social History: reports that she has never smoked. She has never used smokeless tobacco. She reports that she does not drink alcohol or use illicit drugs.  Allergies: No Known Allergies  Medications Prior to Admission   Medication  Sig  Dispense  Refill   .  digoxin (LANOXIN) 0.125 MG tablet  Take 125 mcg by mouth 2 (two) times daily. Taking one tablet in the morning and a one of a tablet in the evening     .  diphenhydrAMINE (BENADRYL) 25 MG tablet  Take 25 mg by mouth every 6 (six) hours as needed. Sleep     .  multivitamin (THERAGRAN) per tablet  Take 1 tablet by mouth daily.     .  verapamil (CALAN-SR) 240 MG CR tablet  Take 240 mg by mouth at bedtime.     Marland Kitchen  warfarin (COUMADIN) 5 MG tablet  Take 2.5-5 mg by mouth. Currently taking 5mg  5 days a week. and 2.5mg  on monday and Friday.     .  torsemide (DEMADEX) 20 MG tablet  Take 1 tablet (20 mg total) by mouth daily.  30 tablet  11    Home:  Home Living  Lives With: Alone  Available Help at Discharge: Family;Available 24 hours/day (daughter and son-in-law can arrange 24 hour assist)  Type of Home: House  Home Access: Stairs to enter  [employer] of Steps: 2  Entrance Stairs-Rails: Right  Home Layout: Two level;Bed/bath upstairs;Full bath on main level  Alternate Level Stairs-Number of Steps: 12  Alternate Level Stairs-Rails: Right  Bathroom Shower/Tub: Tub/shower  unit;Curtain  Bathroom Toilet: Standard  Bathroom Accessibility: Yes  How Accessible: Accessible via walker  Home Adaptive Equipment: None (Gave away her BSC)  Additional Comments: Pt's own house is 76 years old; Pt may go to daughter's home; will need specifics of daughter's home  Functional History:  Prior Function  Able to Take Stairs?: Yes  Driving: Yes  Vocation: Retired  Comments: Extremely independent prior to admission; occasionally went up and down stairs for exercise  Functional Status:  Mobility:  Bed Mobility  Bed Mobility: Sit to Supine  Sit to Supine: 4: Min assist;HOB flat  Transfers  Transfers: Sit to Stand;Stand to Sit  Sit to Stand: 4: Min assist;With upper extremity assist;With armrests;From chair/3-in-1;From toilet;Other (comment) (with grab bar at toilet)  Stand to Sit: 4: Min assist;With upper extremity assist;With armrests;To chair/3-in-1;To toilet  Ambulation/Gait  Ambulation/Gait Assistance: 3: Mod assist;4: Min assist  Ambulation Distance (Feet): 120 Feet  Assistive device: Rolling walker  Ambulation/Gait Assistance Details: Min A needed for steadying, especially during turns. Instructed pt in safe turning with RW management. Pt continues to demonstrate decreased L foot clearance unless she uses steppage gait to compensate. Pt using good hell strike this tx, but continues to have limited knee ext in stance. Posture improved, but pt still with downward gaze to monitor progress with quality.  Gait Pattern: Step-through pattern;Left steppage;Lateral hip instability  Gait velocity: diminished  Stairs: No   ADL:  ADL  Where Assessed - Eating/Feeding: Chair  Grooming: Performed;Minimal assistance  Where Assessed - Grooming: Standing at sink  Upper Body Bathing: Performed;Minimal assistance  Where Assessed - Upper Body Bathing: Standing at sink  Lower Body Bathing: Performed;Moderate assistance  Where Assessed - Lower Body Bathing: Standing at sink;Sit to  stand from chair  Upper Body Dressing: Performed;Minimal assistance  Where Assessed - Upper Body Dressing: Sit to stand from chair  Lower Body Dressing: Performed;Moderate assistance  Where Assessed - Lower Body Dressing: Sitting, chair;Standing;Sit to stand from chair  Toilet Transfer: Minimal assistance  Toilet Transfer Method: Ambulating;Other (comment) (with walker)  Toilet Transfer Equipment: Comfort height toilet;Grab bars  Toileting - Clothing Manipulation: Performed;Minimal assistance  Where Assessed - Glass blower/designer Manipulation: Standing  Toileting - Hygiene: Performed;Minimal assistance  Where Assessed - Toileting Hygiene: Standing  Tub/Shower Transfer: Not assessed  Tub/Shower Transfer Method: Not assessed  ADL Comments: Pt needed verbal cues to keep L knee straight during bathing in standing activity  Cognition:  Cognition  Overall Cognitive Status: Appears within functional limits for tasks assessed  Arousal/Alertness: Awake/alert  Orientation Level: Oriented X4  Cognition  Overall Cognitive Status: Appears within functional limits for tasks assessed/performed  Area of Impairment: Safety/judgement;Awareness of deficits  Arousal/Alertness: Awake/alert  Orientation Level: Appears intact for tasks assessed  Behavior During Session: [facility] for tasks performed (Pt continues to demonstrate some impulsivity, but improving)  Safety/Judgement: Decreased awareness of safety precautions;Decreased safety judgement for tasks assessed;Impulsive  Safety/Judgement - Other Comments: Pt reminded to wait for staff for mobility  Awareness of Deficits: Pt with increasing awareness of deficits, but not safe to transfer independently  Cognition - Other Comments: Pt makes comments throughout tx as though she understands safety needs, but difficult to follow due to high level of independence  Blood pressure 104/58, pulse 60, temperature 98.3 F (36.8 C), temperature source Oral, resp. rate 18,  height 5\' 5"  (1.651 m), weight 74.39 kg (164 lb), SpO2 92.00%.  Physical Exam  Vitals reviewed.  Constitutional: She is oriented to person, place, and time. She appears well-developed.  HENT:  Head: Normocephalic. PERRL, EOMI  Neck: No thyromegaly present.  Cardiovascular:  Cardiac rate controlled, positive murmur. Reg rhythm.  Pulmonary/Chest: Clear to auscultation. No murmurs rubs or gallops  Abdominal: She exhibits no distension. There is no tenderness.  Musculoskeletal: She exhibits no edema.  Neurological: She is alert and oriented to person, place, and time.  Skin: Skin is warm and dry.  Psychiatric: She has a normal mood and affect. Cognition quite appropriate  motor strength is 5/5 in the right deltoid, biceps, triceps, grip, right hip flexor, knee extensor right ankle dorsiflexor and plantar flexor  On the left side she has 4+/5 in the left deltoid, biceps, triceps,grip 5. 3+ hip, 4/5 ADF/APF, 4/5 knee. Mild left pronator drift. Mild rhomberg  Sensation is intact to light touch in bilateral upper and bilateral lower extremities  Tone is normal in the right side and the left upper extremity. Tone is reduced in the left lower extremity  Cranial nerves show no evidence of facial droop no extraocular muscle weakness  Results for orders placed during the hospital encounter of 05/16/11 (from the past 48 hour(s))   CBC Status: Normal    Collection Time    05/18/11 4:43 PM   Component  Value  Range  Comment    WBC  7.8  4.0 - 10.5 (K/uL)     RBC  4.35  3.87 - 5.11 (MIL/uL)     Hemoglobin  12.7  12.0 - 15.0 (g/dL)     HCT  16.1  09.6 - 46.0 (%)     MCV  92.0  78.0 - 100.0 (fL)     MCH  29.2  26.0 - 34.0 (pg)     MCHC  31.8  30.0 - 36.0 (g/dL)     RDW  04.5  40.9 - 15.5 (%)     Platelets  308  150 - 400 (K/uL)    OCCULT BLOOD X 1 CARD TO LAB, STOOL Status: Normal    Collection Time    05/18/11 7:03 PM   Component  Value  Range  Comment    Fecal Occult Bld  POSITIVE     PROTIME-INR  Status: Abnormal    Collection Time    05/19/11 5:46 AM   Component  Value  Range  Comment    Prothrombin Time  24.2 (*)  11.6 - 15.2 (seconds)     INR  2.13 (*)  0.00 - 1.49    CBC Status: Abnormal    Collection Time    05/19/11 5:46 AM   Component  Value  Range  Comment    WBC  7.0  4.0 - 10.5 (K/uL)     RBC  4.00  3.87 - 5.11 (MIL/uL)     Hemoglobin  11.7 (*)  12.0 - 15.0 (g/dL)     HCT  16.1  09.6 - 46.0 (%)     MCV  90.0  78.0 - 100.0 (fL)     MCH  29.3  26.0 - 34.0 (pg)     MCHC  32.5  30.0 - 36.0 (g/dL)     RDW  04.5  40.9 - 15.5 (%)     Platelets  268  150 - 400 (K/uL)    BASIC METABOLIC PANEL Status: Abnormal    Collection Time    05/19/11 5:46 AM   Component  Value  Range  Comment    Sodium  139  135 - 145 (mEq/L)     Potassium  3.7  3.5 - 5.1 (mEq/L)     Chloride  100  96 - 112 (mEq/L)     CO2  28  19 - 32 (mEq/L)     Glucose, Bld  132 (*)  70 - 99 (mg/dL)     BUN  23  6 - 23 (mg/dL)     Creatinine, Ser  8.11  0.50 - 1.10 (mg/dL)     Calcium  9.0  8.4 - 10.5 (mg/dL)     GFR calc non Af Amer  62 (*)  >90 (mL/min)     GFR calc Af Amer  72 (*)  >90 (mL/min)    CBC Status: Normal    Collection Time    05/19/11 1:47 PM   Component  Value  Range  Comment    WBC  7.3  4.0 - 10.5 (K/uL)     RBC  4.42  3.87 - 5.11 (MIL/uL)     Hemoglobin  12.9  12.0 - 15.0 (g/dL)     HCT  91.4  78.2 - 46.0 (%)     MCV  89.6  78.0 - 100.0 (fL)     MCH  29.2  26.0 - 34.0 (pg)     MCHC  32.6  30.0 - 36.0 (g/dL)     RDW  95.6  21.3 - 15.5 (%)     Platelets  314  150 - 400 (K/uL)    PROTIME-INR Status: Abnormal    Collection Time    05/20/11 6:35 AM   Component  Value  Range  Comment    Prothrombin Time  21.8 (*)  11.6 - 15.2 (seconds)     INR  1.86 (*)  0.00 - 1.49    CBC Status: Abnormal    Collection Time    05/20/11 6:35 AM   Component  Value  Range  Comment    WBC  6.9  4.0 - 10.5 (K/uL)     RBC  3.84 (*)  3.87 - 5.11 (MIL/uL)     Hemoglobin  11.4 (*)  12.0 - 15.0 (g/dL)     HCT   08.6 (*)  57.8 - 46.0 (%)     MCV  90.6  78.0 - 100.0 (fL)     MCH  29.7  26.0 - 34.0 (pg)     MCHC  32.8  30.0 - 36.0 (g/dL)     RDW  46.9  62.9 - 15.5 (%)     Platelets  280  150 - 400 (K/uL)     No results found.  Post Admission Physician Evaluation:  1. Functional deficits secondary to right frontal lobe infarct. 2. Patient is admitted to receive collaborative, interdisciplinary care between the  physiatrist, rehab nursing staff, and therapy team. 3. Patient's level of medical complexity and substantial therapy needs in context of that medical necessity cannot be provided at a lesser intensity of care such as a SNF. 4. Patient has experienced substantial functional loss from his/her baseline which was documented above under the "Functional History" and "Functional Status" headings. Judging by the patient's diagnosis, physical exam, and functional history, the patient has potential for functional progress which will result in measurable gains while on inpatient rehab. These gains will be of substantial and practical use upon discharge in facilitating mobility and self-care at the household level. 5. Physiatrist will provide 24 hour management of medical needs as well as oversight of the therapy plan/treatment and provide guidance as appropriate regarding the interaction of the two. 6. 24 hour rehab nursing will assist with bladder management, bowel management, safety, skin/wound care, disease management, medication administration and patient education and help integrate therapy concepts, techniques,education, etc. 7. PT will assess and treat for: LES, ROM, balance, NMR, fxnl mobility. Goals are: mod I. 8. OT will assess and treat for: UES, ROM, ADL's, NMR, fxnl mobility, safety. Goals are: mod I. 9. SLP will assess and treat for: not app 10. Case Management and Social Worker will assess and treat for psychological issues and discharge planning. 11. Team conference will be held weekly to assess  progress toward goals and to determine barriers to discharge. 12. Patient will receive at least 3 hours of therapy per day at least 5 days per week. 13. ELOS and Prognosis: 1 week excellent Medical Problem List and Plan:  1. 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 DVT Prophylaxis/Anticoagulation: Chronic Coumadin therapy. INR 1.86 05/20/2011. Monitor for any signs of bleeding  3. Hypertension/atrial fibrillation. Demadex 20 mg daily, verapamil 180 mg each bedtime and Lanoxin 0.25 mg daily. Monitor with increased mobility. Heart rate is controlled.  4. Tachybradycardia syndrome. History of pacemaker 2006. HR controlled at present  5. History of GI bleed. Recent maroon-colored stools. EGD without active bleeding. Protonic since initiated. Follow serial cbc's. Recent hgb 11.4  6. Epistaxis. Occurrence x1 on 05/14/2011. No further bleeding-follow clinically. Ivory Broad  05/20/2011, 11:45

## 2011-05-20 NOTE — Progress Notes (Signed)
Physical Therapy Treatment Patient Details Name: [name] MRN: [id number] DOB: [birth year] Today's Date: 05/20/2011 Time: 0942-1000 PT Time Calculation (min): 18 min  PT Assessment / Plan / Recommendation Comments on Treatment Session  Pt mobility continues to improve.  Will d/c to CIR today.      Follow Up Recommendations  Inpatient Rehab    Equipment Recommendations  Defer to next venue    Frequency Min 4X/week   Plan Discharge plan remains appropriate;Frequency remains appropriate    Precautions / Restrictions Precautions Precautions: Fall Restrictions Weight Bearing Restrictions: No    Pertinent Vitals/Pain No c/o pain.  O2 sats >90 throughout session.     Mobility  Bed Mobility Bed Mobility: Sit to Supine Sit to Supine: 6: Modified independent (Device/Increase time);HOB elevated Transfers Transfers: Sit to Stand;Stand to Sit Sit to Stand: 5: Supervision;From bed Stand to Sit: 5: Supervision;To bed Details for Transfer Assistance: supervision for safety.   Ambulation/Gait Ambulation/Gait Assistance: 5: Supervision;4: Min assist Ambulation Distance (Feet): 150 Feet Assistive device: Rolling walker;None Ambulation/Gait Assistance Details: Pt able to ambulate approximately 10 feet with no AD.  Pt unsteady, and afraid of Left knee buckling.  Pt able to ambulate 140 feet with RW and verbal cues for posture.  Gait Pattern: Step-through pattern;Left steppage;Lateral hip instability Gait velocity: diminished Stairs: No Wheelchair Mobility Wheelchair Mobility: No Modified Rankin (Stroke Patients Only) Modified Rankin: Slight disability    Exercises Total Joint Exercises Long Arc Quad: AROM;Left;10 reps;Seated Low Level/ICU Exercises Ankle Circles/Pumps: AROM;Left;20 reps;Seated Stabilized Bridging: AROM;Both;5 reps;Supine;Other (comment) Other Exercises Other Exercises: sit to stand from chair x5 with min assist; hands on knees   PT Goals Acute Rehab PT  Goals PT Goal Formulation: With patient Time For Goal Achievement: 05/31/11 Potential to Achieve Goals: Good Pt will go Supine/Side to Sit: with supervision PT Goal: Supine/Side to Sit - Progress: Met Pt will go Sit to Supine/Side: with supervision PT Goal: Sit to Supine/Side - Progress: Met Pt will go Sit to Stand: with supervision PT Goal: Sit to Stand - Progress: Met Pt will go Stand to Sit: with supervision PT Goal: Stand to Sit - Progress: Met Pt will Transfer Bed to Chair/Chair to Bed: with supervision PT Transfer Goal: Bed to Chair/Chair to Bed - Progress: Met Pt will Stand: with supervision;6 - 10 min;with unilateral upper extremity support;with no upper extremity support PT Goal: Stand - Progress: Progressing toward goal Pt will Ambulate: >150 feet;with supervision;with least restrictive assistive device PT Goal: Ambulate - Progress: Met Pt will Go Up / Down Stairs: 6-9 stairs;with min assist;with least restrictive assistive device;with rail(s) PT Goal: Up/Down Stairs - Progress: Not met  Visit Information  Last PT Received On: 05/20/11    Subjective Data  Subjective: I am all packed for rehab.    Cognition  Overall Cognitive Status: Appears within functional limits for tasks assessed/performed Area of Impairment: Safety/judgement;Awareness of deficits Arousal/Alertness: Awake/alert Orientation Level: Appears intact for tasks assessed Behavior During Session: [facility] for tasks performed Safety/Judgement: Decreased awareness of safety precautions;Decreased safety judgement for tasks assessed;Impulsive Safety/Judgement - Other Comments: Pt reminded to wait for staff for mobility Awareness of Deficits: Pt with increasing awareness of deficits, but not safe to transfer independently Cognition - Other Comments: Pt makes comments throughout tx as though she understands safety needs, but difficult to follow due to high level of independence    Balance  Balance Balance Assessed:  Yes  End of Session PT - End of Session Equipment Utilized During Treatment: Gait belt  Activity Tolerance: Patient tolerated treatment well Patient left: in chair;with call bell/phone within reach;with family/visitor present Nurse Communication: Mobility status    Areli Frary 05/20/2011, 1:45 PM Axyl Sitzman L. Victoria Henshaw DPT [id number]

## 2011-05-20 NOTE — Progress Notes (Signed)
ANTICOAGULATION CONSULT NOTE - Follow Up Consult  Pharmacy Consult for  coumadin Indication: atrial fibrillation  No Known Allergies  Patient Measurements:   Heparin Dosing Weight:   Vital Signs: Temp: 97.8 F (36.6 C) (04/24 1518) Temp src: Oral (04/24 1518) BP: 128/74 mmHg (04/24 2229) Pulse Rate: 88  (04/24 1518)  Labs:  Basename 05/20/11 1610 05/19/11 1347 05/19/11 0546 05/18/11 0620  HGB 11.4* 12.9 -- --  HCT 34.8* 39.6 36.0 --  PLT 280 314 268 --  APTT -- -- -- --  LABPROT 21.8* -- 24.2* 21.7*  INR 1.86* -- 2.13* 1.85*  HEPARINUNFRC -- -- -- --  CREATININE -- -- 0.83 0.86  CKTOTAL -- -- -- --  CKMB -- -- -- --  TROPONINI -- -- -- --   The CrCl is unknown because both a height and weight (above a minimum accepted value) are required for this calculation.   Medications:  Scheduled:    . digoxin  0.125 mg Oral Daily  . pantoprazole  40 mg Oral Q1200  . torsemide  20 mg Oral Daily  . verapamil  180 mg Oral QHS  . warfarin  5 mg Oral ONCE-1800  . Warfarin - Pharmacist Dosing Inpatient   Does not apply q1800    Assessment: 78 yr old female admitted with nonhemorrhagic CVA. Now transferred to rehab. To continue on Coumadin with new goal of 2-2.5. INR today is 1.86. Pt has had some maroon stools but scope showed no bleeding. Monitor closely for s/s of bleeding. Goal of Therapy:  INR 2-2.5   Plan:  Will give 5 mg coumadin tonight. F/U daily INR. Monitor for s/s of bleeding.  [name] 05/20/2011,10:50 PM

## 2011-05-20 NOTE — PMR Pre-admission (Signed)
PMR Admission Coordinator Pre-Admission Assessment  Patient: [name] is an 78 y.o., female MRN: [id number] DOB: Sep 06, 1923 Height: 5\' 5"  (165.1 cm) Weight: 74.39 kg (164 lb)  Insurance Information HMO:      PPO:       PCP:       IPA:       80/20:       OTHER:   PRIMARY: Medicare A/B      Policy#: [id number]      Subscriber: [name] CM Name:        Phone#:       Fax#:   Pre-Cert#:        Employer: Retired Benefits:  Phone #:       Name: Armed forces training and education officer. Date: 01/26/90     Deduct: $1184      Out of Pocket Max: none      Life Max: unlimited CIR: 100%      SNF: 100 days   LBD = 02/19/06 Outpatient: 80%     Co-Pay: 20% Home Health: 100%      Co-Pay: none DME: 80%     Co-Pay: 20% Providers: patient's choice  SECONDARY: BCBS of [place] sup      Policy#: [id number]      Subscriber: [name] CM Name:        Phone#:       Fax#:   Pre-Cert#:        Employer:  Retired Benefits:  Phone #: [phone]     Name:   Eff. Date:       Deduct:        Out of Pocket Max:        Life Max:   CIR:        SNF:   Outpatient:       Co-Pay:   Home Health:        Co-Pay:   DME:       Co-Pay:    Emergency Contact Information Contact Information    Name Relation Home Work Mobile   Cathey,Donna Daughter [phone]       Current Medical History  Patient Admitting Diagnosis:  Right frontal infarct with left lower extremity greater than left upper extremity weakness.  History of Present Illness: 78 year old right-handed female with history of atrial fibrillation/pacemaker with chronic Coumadin therapy. Admitted April 20 left side weakness. 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 MRI could not be completed due to pacemaker. Carotid Dopplers with no ICA stenosis.  EGD done 04/23 for guiac positive stools with no new evidence of bleeding.  Total: 0   Past Medical History  Past Medical History  Diagnosis Date  . Pericardial  effusion     Chronic. Last echo in April of 2012.   . Atrial fibrillation     Chronic  . Chronic anticoagulation     on Coumadin  . History of GI bleed   . HTN (hypertension)   . Sleep apnea   . Pacemaker     placed for tachybrady.   . Valvular heart disease   . Obesity   . LVH (left ventricular hypertrophy)     Family History  family history includes Pneumonia in her father and Stroke in her father and mother.  Prior Rehab/Hospitalizations: No prior rehab admissions.   Current Medications  Current facility-administered medications:0.9 %  sodium chloride infusion, 250 mL, Intravenous, PRN, Hilarie Fredrickson, MD;  bisacodyl (DULCOLAX) suppository 10 mg, 10 mg, Rectal,  Daily PRN, Hilarie Fredrickson, MD;  digoxin (LANOXIN) tablet 0.125 mg, 0.125 mg, Oral, Daily, Amber M Hairford, MD, 0.125 mg at 05/20/11 0935;  pantoprazole (PROTONIX) injection 40 mg, 40 mg, Intravenous, Q24H, Shirley Friar, MD, 40 mg at 05/19/11 1807 polyethylene glycol (MIRALAX / GLYCOLAX) packet 17 g, 17 g, Oral, Daily PRN, Amber M Hairford, MD;  sodium chloride 0.9 % injection 3 mL, 3 mL, Intravenous, Q12H, Amber M Hairford, MD, 3 mL at 05/19/11 2152;  sodium chloride 0.9 % injection 3 mL, 3 mL, Intravenous, PRN, Hilarie Fredrickson, MD;  torsemide (DEMADEX) tablet 20 mg, 20 mg, Oral, Daily, Hilarie Fredrickson, MD, 20 mg at 05/20/11 0935 traZODone (DESYREL) tablet 50 mg, 50 mg, Oral, QHS PRN, Dessa Phi, MD, 50 mg at 05/17/11 2241;  verapamil (CALAN-SR) CR tablet 180 mg, 180 mg, Oral, QHS, Josalyn Funches, MD, 180 mg at 05/19/11 2152;  Warfarin - Pharmacist Dosing Inpatient, , Does not apply, q1800, Ann Held, [name] DISCONTD: 0.9 %  sodium chloride infusion, , Intravenous, Continuous, Shirley Friar, MD, Last Rate: 20 mL/hr at 05/19/11 1525, 500 mL at 05/19/11 1525;  DISCONTD: butamben-tetracaine-benzocaine (CETACAINE) spray, , , PRN, Shirley Friar, MD, 1 spray at 05/19/11 1534;  DISCONTD: fentaNYL  NICU IV Syringe 50 mcg/mL, , , PRN, Shirley Friar, MD, 25 mcg at 05/19/11 1533 DISCONTD: midazolam (VERSED) injection, , , PRN, Shirley Friar, MD, 1 mg at 05/19/11 1538;  DISCONTD: pantoprazole (PROTONIX) EC tablet 40 mg, 40 mg, Oral, Q0600, Ardyth Gal, MD  Patients Current Diet: Cardiac  Precautions / Restrictions Precautions Precautions: Fall Restrictions Weight Bearing Restrictions: No   Prior Activity Level Community (5-7x/wk): Went out daily.  Home Assistive Devices / Equipment Home Assistive Devices/Equipment: None Home Adaptive Equipment: None (Gave away her [facility])  Prior Functional Level Prior Function Level of Independence: Independent Able to Take Stairs?: Yes Driving: Yes Vocation: Retired Comments: Extremely independent prior to admission; occasionally went up and down stairs for exercise  Current Functional Level Cognition  Arousal/Alertness: Awake/alert Overall Cognitive Status: Appears within functional limits for tasks assessed Overall Cognitive Status: Appears within functional limits for tasks assessed/performed Orientation Level: Oriented X4 Safety/Judgement: Decreased awareness of safety precautions;Decreased safety judgement for tasks assessed;Impulsive Safety/Judgement - Other Comments: Pt reminded to wait for staff for mobility Awareness of Deficits: Pt with increasing awareness of deficits, but not safe to transfer independently Cognition - Other Comments: Pt makes comments throughout tx as though she understands safety needs, but difficult to follow due to high level of independence    Sensation       Coordination       ADLs  Where Assessed - Eating/Feeding: Chair Grooming: Performed;Minimal assistance Where Assessed - Grooming: Standing at sink Upper Body Bathing: Performed;Minimal assistance Where Assessed - Upper Body Bathing: Standing at sink Lower Body Bathing: Performed;Moderate assistance Where Assessed - Lower Body  Bathing: Standing at sink;Sit to stand from chair Upper Body Dressing: Performed;Minimal assistance Where Assessed - Upper Body Dressing: Sit to stand from chair Lower Body Dressing: Performed;Moderate assistance Where Assessed - Lower Body Dressing: Sitting, chair;Standing;Sit to stand from chair Toilet Transfer: Minimal assistance Toilet Transfer Method: Ambulating;Other (comment) (with walker) Toilet Transfer Equipment: Comfort height toilet;Grab bars Toileting - Clothing Manipulation: Performed;Minimal assistance Where Assessed - Glass blower/designer Manipulation: Standing Toileting - Hygiene: Performed;Minimal assistance Where Assessed - Toileting Hygiene: Standing Tub/Shower Transfer: Not assessed Tub/Shower Transfer Method: Not assessed ADL Comments: Pt needed verbal cues to keep L knee straight during  bathing in standing activity    Mobility  Bed Mobility: Sit to Supine Sit to Supine: 4: Min assist;HOB flat    Transfers  Transfers: Sit to Stand;Stand to Sit Sit to Stand: 4: Min assist;With upper extremity assist;With armrests;From chair/3-in-1;From toilet;Other (comment) (with grab bar at toilet) Stand to Sit: 4: Min assist;With upper extremity assist;With armrests;To chair/3-in-1;To toilet    Ambulation / Gait / Stairs / Wheelchair Mobility  Ambulation/Gait Ambulation/Gait Assistance: 3: Mod assist;4: Min Environmental consultant (Feet): 120 Feet Assistive device: Rolling walker Ambulation/Gait Assistance Details: Min A needed for steadying, especially during turns. Instructed pt in safe turning with RW management. Pt continues to demonstrate decreased L foot clearance unless she uses steppage gait to compensate. Pt using good hell strike this tx, but continues to have limited knee ext in stance. Posture improved, but pt still with downward gaze to monitor progress with quality.  Gait Pattern: Step-through pattern;Left steppage;Lateral hip instability Gait velocity:  diminished Stairs: No    Posture / Balance Dynamic Standing Balance Dynamic Standing - Balance Support: No upper extremity supported Dynamic Standing - Level of Assistance: 3: Mod assist Dynamic Standing - Balance Activities: Lateral lean/weight shifting;Forward lean/weight shifting;Reaching across midline Dynamic Standing - Comments: Pt with decreased stability on L while reaching in all directions. 1 LOB, but pt with good reactions, able to recover with Mod A High Level Balance High Level Balance Activites: Side stepping;Backward walking;Turns;Direction changes High Level Balance Comments: Performed all conditions x10' with Mod A due to LOB and decreased L foot clearance.      Previous Home Environment Living Arrangements: Alone Lives With: Alone Type of Home: House Home Layout: Two level;Bed/bath upstairs;Full bath on main level Alternate Level Stairs-Rails: Right Alternate Level Stairs-Number of Steps: 12 Home Access: Stairs to enter Entrance Stairs-Rails: Right Entrance Stairs-Number of Steps: 2 Bathroom Shower/Tub: Forensic scientist: Standard Bathroom Accessibility: Yes How Accessible: Accessible via walker Home Care Services: No Additional Comments: Pt's own house is 78 years old; Pt may go to daughter's home; will need specifics of daughter's home  Discharge Living Setting Plans for Discharge Living Setting: Patient's home;Alone Type of Home at Discharge: House Discharge Home Layout: Two level;Bed/bath upstairs (Could sleep in the couch on the main level.) Alternate Level Stairs-Number of Steps: 15 Discharge Home Access: Stairs to enter Entrance Stairs-Number of Steps: 2 Do you have any problems obtaining your medications?: No  Social/Family/Support Systems Patient Roles: Parent Contact Information: Dierdre Searles - Daughter (h) [phone] Anticipated Caregiver: self, Dtr can assist as well (Could go home with dtr, Lupita Leash, if  needed.) Ability/Limitations of Caregiver: Dtr has mild CP, cares for step children (Has 1 Dtr in [employer] and 1 Dtr 1 mile away, 1 Dtr in [employer].) Caregiver Availability: Intermittent Discharge Plan Discussed with Primary Caregiver: Yes Is Caregiver In Agreement with Plan?: Yes Does Caregiver/Family have Issues with Lodging/Transportation while Pt is in Rehab?: No  Goals/Additional Needs Patient/Family Goal for Rehab: PT/OT mod I, Shower with supervision goals Expected length of stay: 10 days Cultural Considerations: None Dietary Needs: Heart diet, thin liquids Equipment Needs: TBD Pt/Family Agrees to Admission and willing to participate: Yes Program Orientation Provided & Reviewed with Pt/Caregiver Including Roles  & Responsibilities: Yes  Patient Condition: This patient's condition remains as documented in the Consult dated 05/18/11, in which the Rehabilitation Physician determined and documented that the patient's condition is appropriate for intensive rehabilitative care in an inpatient rehabilitation facility.  Preadmission Screen Completed By:  Trish Mage, 05/20/2011  10:00 AM ______________________________________________________________________   Discussed status with Dr. Riley Kill on 05/20/11 at [phone] and received telephone approval for admission today.  Admission Coordinator:  Trish Mage, time0956/Date04/24/13

## 2011-05-20 NOTE — Progress Notes (Signed)
Patient ID: [name], female   DOB: [birth year], 78 y.o.   MRN: [id number]  About to eat lunch. No further bleeding. H. Pylori serology pending and if positive, then needs treatment for that. Will change to PO BID PPI. Will sign off. Call if questions.

## 2011-05-20 NOTE — Discharge Summary (Signed)
Seen and examined.  Agree with DC today to rehab.  Appreciate GI help.  Will continue double dose PPI, monitor Hgb and aim for INR between 2.0 & 2.5.

## 2011-05-20 NOTE — Progress Notes (Signed)
Rehab admissions - Continuing to follow. I spoke with resident and feel patient will be able to admit to inpatient rehab today.  Bed available on rehab.  Call me for questions.  #[phone]

## 2011-05-20 NOTE — Discharge Summary (Addendum)
Physician Discharge Summary  Patient ID: [name] MRN: [id number] DOB/AGE: May 28, 1923 78 y.o.  Admit date: 05/16/2011 Discharge date: 05/20/2011  Admission Diagnoses: Left sided weakness  Discharge Diagnoses:  Principal Problem:  *Weakness of left side of body Active Problems:  Atrial fibrillation  Chronic anticoagulation  HTN (hypertension)  Edema  Pacemaker  Elevated digoxin level  GI bleed   Discharged Condition: stable  Hospital Course: Patient is a [name] who presented with acute onset left sided weakness on 05/16/11. Patient states at that time, she had decreased ability to move her left leg and was not able to have good coordination her left arm. Patient denies facial weakness, slurred speech or difficulty speaking. She was admitted for stroke work up, and in brief, her hospital problems are as follows,  1. CVA- Patient admitted with left sided weakness. Neurology was consulted. Patient had a score of 4 on the stroke scale at admission. She was kept on her chronic Coumadin for anticoagulation. She was not a candidate for an MRI at admission due to pacemaker, but repeat CT scan showed a small acute non hemorrhagic mid to posterior right frontal lobe infarct. Patient's weakness improved somewhat throughout hospitalization. She required a walker to ambulate which she did not use prior to admission. [facility] was consulted and agrees that patient is an excellent candidate for rehab. She will be living with her daughter after discharge. She should follow up with Dr. Pearlean Brownie two months after discharge as well.   2. Atrial Fib s/p pacemaker- Patient has chronic atrial fibrillation. She is on Verapamil as an outpatient. Her dose was decreased during hospitalization due to low blood pressures in the setting of acute stroke. She will be discharged on Verapamil 180mg  qhs. This may be adjusted by Cardiology as an outpatient as they see fit. Patient was also on Digoxin 0.125mg   BID prior to admission for rate control. She had an elevated Digoxin level at admission to 2.1. Her Digoxin dose was also reduced to 0.125 daily. She should be monitored for increased heart rate, but stable at discharge. Patient on chronic Coumadin therapy. Please see #3 below.  3. Subtherapeutic INR- Patient on Coumadin at home. Per [employer], her goal was 1.8-2.0 due to history of GI bleed. Patient had a CVA with an INR of 1.7 therefore her goal range was changed to 2.0-2.5. Her coumadin was dosed per pharmacy while in the hospital, and should continue while she is in rehab until she is at a steady dose. She will have her Coumadin followed by [facility] at final discharge. Of note, patient did report black tarry stools while in the hospital. Please see #4.  4. GI bleed- Patient on chronic coumadin reported dark stools. Since she has a history of GI bleed, GI was consulted for EGD. Patient did not have any active bleeding noted on scope, and hemoglobin remained stable throughout admission. Patient should be monitored for GI bleed. Per GI recs, patient should be on high dose PPI twice daily for ulcers. H.pylori pending. She may eat a regular heart healthy diet.  5. HTN/Edema- History of hypertension and lower extremity edema. Patient had echo during this admission which showed vigorous systolic function and EF of 65-70%. Continued on home Torsemide and lower dose of Verapamil in setting of acute CVA. Patient's BP remained stable.   Consults: neurology and rehabilitation medicine  Significant Diagnostic Studies:  CBC    Component Value Date/Time   WBC 6.9 05/20/2011 4098  RBC 3.84* 05/20/2011 0635   HGB 11.4* 05/20/2011 0635   HCT 34.8* 05/20/2011 0635   PLT 280 05/20/2011 0635   MCV 90.6 05/20/2011 0635   MCH 29.7 05/20/2011 0635   MCHC 32.8 05/20/2011 0635   RDW 13.9 05/20/2011 0635   LYMPHSABS 1.2 05/16/2011 1000   MONOABS 0.8 05/16/2011 1000   EOSABS 0.1 05/16/2011 1000   BASOSABS  0.0 05/16/2011 1000   Ct Head Wo Contrast  05/18/2011  *RADIOLOGY REPORT*  Clinical Data: Persistent left-sided leg weakness.  CT HEAD WITHOUT CONTRAST  Technique:  Contiguous axial images were obtained from the base of the skull through the vertex without contrast.  Comparison: 05/16/2011.  Findings: No intracranial hemorrhage.  Now visualized is a small acute non hemorrhagic mid to posterior right frontal lobe infarct.  Moderate small vessel disease type changes.  Global atrophy without hydrocephalus.  No intracranial mass lesion detected on this unenhanced exam.  Vascular calcifications.  IMPRESSION: Small acute non hemorrhagic mid to posterior right frontal lobe infarct.  Please see above.  Original Report Authenticated By: Fuller Canada, M.D.   Ct Head Wo Contrast  05/16/2011  *RADIOLOGY REPORT*  Clinical Data: 78 year old female with left lower extremity weakness.  Atrial fibrillation.  CT HEAD WITHOUT CONTRAST  Technique:  Contiguous axial images were obtained from the base of the skull through the vertex without contrast.  Comparison: None.  Findings: Visualized paranasal sinuses and mastoids are clear.  No acute osseous abnormality identified.  Postoperative changes to the globes.  Negative scalp soft tissues.  Calcified atherosclerosis at the skull base.  Cerebral volume is within normal limits for age.  No ventriculomegaly. No midline shift, mass effect, or evidence of mass lesion.  No acute intracranial hemorrhage identified.  No evidence of cortically based acute infarction identified.  Mild to moderate for age cerebral white matter hypodensity, primarily around the frontal horns. No suspicious intracranial vascular hyperdensity.  IMPRESSION: No acute intracranial abnormality.  Mild to moderate for age nonspecific white matter changes.  Original Report Authenticated By: Harley Hallmark, M.D.   Treatments: IV hydration, cardiac meds: verapamil (decreased dose to 180mg  QHS) and digoxin (decreased  dose to 0.125 daily) and procedures: EGD (which did not show active bleeding)  Discharge Exam: Blood pressure 134/73, pulse 79, temperature 98.3 F (36.8 C), temperature source Oral, resp. rate 18, height 5\' 5"  (1.651 m), weight 164 lb (74.39 kg), SpO2 94.00%. General appearance: alert, cooperative and no distress. Sitting in bed Lungs: clear to auscultation bilaterally  Heart: regular rate, irregular rhythm and systolic murmur: holosystolic 2/6 Abdomen: soft, non-tender; bowel sounds normal  Extremities: 1+ edema, R>L  Neurologic: Mental status: Alert, oriented, thought content appropriate  Cranial nerves: normal  Sensory: Decreased sensation on extremities b/l, L>R  Motor: Strength in LLE 4/5, can raise leg to almost 45 degree angle. RLE 5/5, RUE 5/5, LUE 5/5  Recommendations for follow-up: - Needs f/u with Neuro in 2 months - Needs f/u with [facility] after discharge - May increase Verapamil dose is hypertensive - Monitor for GI bleed - Continue Coumadin with INR goal of 2.0-2.5  Disposition: [facility]  Medication List  As of 05/20/2011  9:33 AM   TAKE these medications         digoxin 0.125 MG tablet   Commonly known as: LANOXIN   Take 1 tablet (0.125 mg total) by mouth daily.      diphenhydrAMINE 25 MG tablet   Commonly known as: BENADRYL   Take  25 mg by mouth every 6 (six) hours as needed. Sleep      multivitamin per tablet   Take 1 tablet by mouth daily.      polyethylene glycol packet   Commonly known as: MIRALAX / GLYCOLAX   Take 17 g by mouth daily as needed.      torsemide 20 MG tablet   Commonly known as: DEMADEX   Take 1 tablet (20 mg total) by mouth daily.      torsemide 20 MG tablet   Commonly known as: DEMADEX   Take 1 tablet (20 mg total) by mouth daily.      traZODone 50 MG tablet   Commonly known as: DESYREL   Take 1 tablet (50 mg total) by mouth at bedtime as needed for sleep.      verapamil 180 MG CR tablet   Commonly known  as: CALAN-SR   Take 1 tablet (180 mg total) by mouth at bedtime.      Warfarin - Pharmacist Dosing Inpatient Misc   1 each by Does not apply route daily at 6 PM.      warfarin 5 MG tablet   Commonly known as: COUMADIN   Take 2.5-5 mg by mouth. Currently taking 5mg  5 days a week. and 2.5mg  on monday and Friday.           Follow-up Information    Follow up with Gates Rigg, MD. Schedule an appointment as soon as possible for a visit in 2 months.   Contact information:   [address], [address] [facility] [place] [address] [phone]          Signed: Rodman Pickle 05/20/2011, 9:33 AM  To clarify, by indicating left sided weakness, we indicate the diagnosis of left hemiparesis.

## 2011-05-21 DIAGNOSIS — G8110 Spastic hemiplegia affecting unspecified side: Secondary | ICD-10-CM

## 2011-05-21 DIAGNOSIS — Z5189 Encounter for other specified aftercare: Secondary | ICD-10-CM

## 2011-05-21 DIAGNOSIS — I6330 Cerebral infarction due to thrombosis of unspecified cerebral artery: Secondary | ICD-10-CM

## 2011-05-21 LAB — H. PYLORI ANTIBODY, IGG: H Pylori IgG: 2.99 {ISR} — ABNORMAL HIGH

## 2011-05-21 LAB — DIFFERENTIAL
Basophils Absolute: 0 10*3/uL (ref 0.0–0.1)
Basophils Relative: 1 % (ref 0–1)
Eosinophils Absolute: 0.4 10*3/uL (ref 0.0–0.7)
Eosinophils Relative: 6 % — ABNORMAL HIGH (ref 0–5)
Lymphocytes Relative: 21 % (ref 12–46)
Lymphs Abs: 1.3 10*3/uL (ref 0.7–4.0)
Monocytes Absolute: 0.6 10*3/uL (ref 0.1–1.0)
Monocytes Relative: 9 % (ref 3–12)
Neutro Abs: 3.9 10*3/uL (ref 1.7–7.7)
Neutrophils Relative %: 63 % (ref 43–77)

## 2011-05-21 LAB — COMPREHENSIVE METABOLIC PANEL
ALT: 16 U/L (ref 0–35)
AST: 20 U/L (ref 0–37)
Albumin: 3.2 g/dL — ABNORMAL LOW (ref 3.5–5.2)
Alkaline Phosphatase: 74 U/L (ref 39–117)
BUN: 24 mg/dL — ABNORMAL HIGH (ref 6–23)
CO2: 29 mEq/L (ref 19–32)
Calcium: 9.1 mg/dL (ref 8.4–10.5)
Chloride: 100 mEq/L (ref 96–112)
Creatinine, Ser: 0.85 mg/dL (ref 0.50–1.10)
GFR calc Af Amer: 70 mL/min — ABNORMAL LOW (ref >90)
GFR calc non Af Amer: 60 mL/min — ABNORMAL LOW (ref >90)
Glucose, Bld: 119 mg/dL — ABNORMAL HIGH (ref 70–99)
Potassium: 3.5 mEq/L (ref 3.5–5.1)
Sodium: 140 mEq/L (ref 135–145)
Total Bilirubin: 0.5 mg/dL (ref 0.3–1.2)
Total Protein: 6.3 g/dL (ref 6.0–8.3)

## 2011-05-21 LAB — CBC
Platelets: 275 10*3/uL (ref 150–400)
RBC: 3.87 MIL/uL (ref 3.87–5.11)
RDW: 13.8 % (ref 11.5–15.5)
WBC: 6.2 10*3/uL (ref 4.0–10.5)

## 2011-05-21 MED ORDER — WARFARIN SODIUM 5 MG PO TABS
5.0000 mg | ORAL_TABLET | Freq: Once | ORAL | Status: AC
  Administered 2011-05-21: 5 mg via ORAL
  Filled 2011-05-21: qty 1

## 2011-05-21 MED ORDER — MUSCLE RUB 10-15 % EX CREA
TOPICAL_CREAM | CUTANEOUS | Status: DC | PRN
  Filled 2011-05-21: qty 85

## 2011-05-21 MED ORDER — POLYETHYLENE GLYCOL 3350 17 G PO PACK
17.0000 g | PACK | Freq: Every day | ORAL | Status: DC
  Administered 2011-05-21 – 2011-05-25 (×5): 17 g via ORAL
  Filled 2011-05-21 (×6): qty 1

## 2011-05-21 NOTE — Progress Notes (Signed)
Inpatient Rehabilitation Center Individual Statement of Services  Patient Name:  [name]  Date:  05/21/2011  Welcome to the Inpatient Rehabilitation Center.  Our goal is to provide you with an individualized program based on your diagnosis and situation, designed to meet your specific needs.  With this comprehensive rehabilitation program, you will be expected to participate in at least 3 hours of rehabilitation therapies Monday-Friday, with modified therapy programming on the weekends.  Your rehabilitation program will include the following services:    Weekly team conferences will be held on Wednesdays to discuss your progress.  Your RN Case Designer, television/film set will talk with you frequently to get your input and to update you on team discussions.  Team conferences with you and your family in attendance may also be held.  Expected length of stay: 3-5 days  Overall anticipated outcome: Modified independence  Depending on your progress and recovery, your program may change.  Your RN Case Estate agent will coordinate services and will keep you informed of any changes.  Your RN Sports coach and SW names and contact numbers are listed  below.  The following services may also be recommended but are not provided by the Inpatient Rehabilitation Center:   Driving Evaluations  Home Health Rehabiltiation Services  Outpatient Rehabilitatation [facility]  Vocational Rehabilitation   Arrangements will be made to provide these services after discharge if needed.  Arrangements include referral to agencies that provide these services.  Your insurance has been verified to be:  Medicare + BCBS of [place] Your primary doctor is:  Dr Viviann Spare Daub  Pertinent information will be shared with your doctor and your insurance company.  Case Manager: Lutricia Horsfall, [facility] [phone]  Social Worker:  Dossie Der, [place] [phone]  Information discussed with and copy given to patient  by: Meryl Dare, 05/21/2011

## 2011-05-21 NOTE — Progress Notes (Signed)
ANTICOAGULATION CONSULT NOTE - Follow Up Consult  Pharmacy Consult:  Coumadin Indication: atrial fibrillation  No Known Allergies  Patient Measurements: Weight = 74.4 kg  Vital Signs: Temp: 98.7 F (37.1 C) (04/25 0513) Temp src: Oral (04/25 0513) BP: 137/74 mmHg (04/25 0513) Pulse Rate: 76  (04/25 0513)  Labs:  Basename 05/21/11 0617 05/20/11 0635 05/19/11 1347 05/19/11 0546  HGB 11.4* 11.4* -- --  HCT 34.9* 34.8* 39.6 --  PLT 275 280 314 --  APTT -- -- -- --  LABPROT 19.0* 21.8* -- 24.2*  INR 1.56* 1.86* -- 2.13*  HEPARINUNFRC -- -- -- --  CREATININE 0.85 -- -- 0.83  CKTOTAL -- -- -- --  CKMB -- -- -- --  TROPONINI -- -- -- --   The CrCl is unknown because both a height and weight (above a minimum accepted value) are required for this calculation.    Assessment: 78 yr old female admitted with nonhemorrhagic CVA.  Pt has had some maroon stools but scope showed no bleeding, so okay to resume Coumadin per GI.  INR trended down further d/t Coumadin being held x 2 days.  No further bleeding reported.  Was on Coumadin 5mg  PO daily except 2.5mg  on Mon and Fri PTA.   Goal of Therapy:  INR 2-2.5    Plan:  - Repeat Coumadin 5mg  PO today - Daily PT/INR - Monitor for s/sx of bleeding - F/U H. pylori result    Worthy Boschert D. Laney Potash, [name], BCPS Pager:  [phone] 05/21/2011, 10:31 AM

## 2011-05-21 NOTE — Progress Notes (Signed)
Per State Regulation 482.30 This chart was reviewed for medical necessity with respect to the patient's Admission/Duration of stay. Meryl Dare                 Nurse Care Manager            Next Review Date: 05/25/11

## 2011-05-21 NOTE — Evaluation (Signed)
Occupational Therapy Assessment and Plan and Skilled Therapy Intervention  Patient Details  Name: [name] MRN: [id number] Date of Birth: [birth year]  OT Diagnosis: ataxia in LLE and LLE hemiplegia affecting non-dominant side Rehab Potential: Rehab Potential: Good ELOS: 5 - 7 days   Today's Date: 05/21/2011 Time: 1100-1200 Time Calculation (min): 60 min  Skilled Therapy Intervention:  Pt seen for initial evaluation and BADL retraining of toileting, bathing, and dressing with a focus on dynamic standing balance.  Pt demonstrates decreased coordination and strength of LLE which increases her fall risk with ambulating during her ADL activities.  Discussed with Pt/ daughter the OT P.O.C. to enable her to return home safely.    Problem List:  Patient Active Problem List  Diagnoses  . Atrial fibrillation  . Pericardial effusion  . Chronic anticoagulation  . HTN (hypertension)  . Edema  . Pacemaker  . Weakness of left side of body  . Elevated digoxin level  . GI bleed  . CVA (cerebral infarction)    Past Medical History:  Past Medical History  Diagnosis Date  . Pericardial effusion     Chronic. Last echo in April of 2012.   . Atrial fibrillation     Chronic  . Chronic anticoagulation     on Coumadin  . History of GI bleed   . HTN (hypertension)   . Sleep apnea   . Pacemaker     placed for tachybrady.   . Valvular heart disease   . Obesity   . LVH (left ventricular hypertrophy)    Past Surgical History:  Past Surgical History  Procedure Date  . Pacemaker insertion     Last generator in 2006  . Breast lumpectomy     bilaterally  . Tonsillectomy   . Adenoidectomy   . Esophagogastroduodenoscopy 05/19/2011    Procedure: ESOPHAGOGASTRODUODENOSCOPY (EGD);  Surgeon: Shirley Friar, MD;  Location: [facility] ENDOSCOPY;  Service: Endoscopy;  Laterality: N/A;  doctor aware of inr   will try to be here no later than 230    Assessment & Plan Clinical Impression:  78 year old right-handed female with history of atrial fibrillation/pacemaker with chronic Coumadin therapy. Admitted April 20 left side weakness. 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 MRI could not be completed due to pacemaker. Carotid Dopplers with no ICA stenosis. Echocardiogram with ejection fraction of 70% without emboli. Patient transferred to CIR on 05/20/2011 .    Patient currently requires supervision with basic self-care skills and IADL secondary to LLE ataxia and decreased standing balance and decreased balance strategies.  Prior to hospitalization, patient could complete all basic and IADLS independently.  Pt was driving and working in her garden.  Patient will benefit from skilled intervention to increase independence with basic self-care skills and increase level of independence with iADL prior to discharge home independently.  Anticipate patient will require intermittent supervision and no further OT follow recommended.  OT - End of Session Activity Tolerance: Tolerates 30+ min activity without fatigue Endurance Deficit: No OT Assessment Rehab Potential: Good Barriers to Discharge: None OT Plan OT Frequency: 1-2 X/day, 60-90 minutes;5 out of 7 days Estimated Length of Stay: 5 - 7 days OT Treatment/Interventions: Balance/vestibular training;Discharge planning;DME/adaptive equipment instruction;Functional mobility training;Neuromuscular re-education;Patient/family education;Self Care/advanced ADL retraining;Therapeutic Activities;Therapeutic Exercise;UE/LE Strength taining/ROM;UE/LE Coordination activities OT Recommendation Follow Up Recommendations: None Equipment Recommended: Tub/shower seat  OT Evaluation Precautions/Restrictions  Precautions Precautions: Fall;ICD/Pacemaker General Chart Reviewed: Yes Family/Caregiver Present: Yes Pain Pain Assessment Pain Assessment:  No/denies pain Pain Score: 0-No pain Home  Living/Prior Functioning Home Living Lives With: Alone Available Help at Discharge: Family Type of Home: House Home Access: Stairs to enter Secretary/administrator of Steps: 2 Entrance Stairs-Rails: Left Home Layout: Two level;Bed/bath upstairs Alternate Level Stairs-Number of Steps: 12 Alternate Level Stairs-Rails: Left Home Adaptive Equipment: None Prior Function Level of Independence: Independent with transfers;Independent with gait;Independent with homemaking with ambulation;Independent with basic ADLs Able to Take Stairs?: Yes Driving: Yes ADL ADL Eating: Independent Grooming: Independent Where Assessed-Grooming: Standing at sink Upper Body Bathing: Independent Where Assessed-Upper Body Bathing: Shower Lower Body Bathing: Supervision/safety Where Assessed-Lower Body Bathing: Shower Upper Body Dressing: Setup Where Assessed-Upper Body Dressing: Chair Lower Body Dressing: Supervision/safety Where Assessed-Lower Body Dressing: Chair Toileting: Supervision/safety Where Assessed-Toileting: Teacher, adult education: Close supervision Toilet Transfer Method: Event organiser: Close supervision Film/video editor Method: Designer, industrial/product: Emergency planning/management officer Vision/Perception  Vision - History Baseline Vision: No visual deficits Patient Visual Report: No change from baseline Vision - Assessment Eye Alignment: Within Functional Limits Perception Perception: Within Functional Limits Praxis Praxis: Intact  Cognition Overall Cognitive Status: Appears within functional limits for tasks assessed Sensation Sensation Light Touch: Impaired Detail Light Touch Impaired Details: Impaired RLE;Impaired LLE Stereognosis: Appears Intact Hot/Cold: Appears Intact Proprioception: Appears Intact Additional Comments: h/o neuropathy bilat LE Coordination Gross Motor Movements are Fluid and Coordinated: No Fine Motor Movements are Fluid and  Coordinated: Yes Finger Nose Finger Test: WFL BUE Motor  Motor Motor: Hemiplegia Motor - Skilled Clinical Observations: LLE hemiplegia, LUE WFL Mobility  Bed Mobility Sit to Supine: 6: Modified independent (Device/Increase time)  Trunk/Postural Assessment  Cervical Assessment Cervical Assessment: Within Functional Limits Thoracic Assessment Thoracic Assessment: Within Functional Limits Lumbar Assessment Lumbar Assessment: Within Functional Limits Postural Control Postural Control: Deficits on evaluation Righting Reactions: LOB to L during gait but able to self correct with stepping strategy with min A  Balance Standardized Balance Assessment Standardized Balance Assessment: Berg Balance Test;Dynamic Gait Index Berg Balance Test Sit to Stand: Able to stand without using hands and stabilize independently Standing Unsupported: Able to stand safely 2 minutes Sitting with Back Unsupported but Feet Supported on Floor or Stool: Able to sit safely and securely 2 minutes Stand to Sit: Sits safely with minimal use of hands Transfers: Able to transfer safely, minor use of hands Standing Unsupported with Eyes Closed: Able to stand 10 seconds with supervision Standing Ubsupported with Feet Together: Able to place feet together independently and stand 1 minute safely From Standing, Reach Forward with Outstretched Arm: Can reach confidently >25 cm (10") From Standing Position, Pick up Object from Floor: Able to pick up shoe, needs supervision From Standing Position, Turn to Look Behind Over each Shoulder: Looks behind from both sides and weight shifts well Turn 360 Degrees: Needs close supervision or verbal cueing Standing Unsupported, Alternately Place Feet on Step/Stool: Able to stand independently and complete 8 steps >20 seconds Standing Unsupported, One Foot in Front: Able to take small step independently and hold 30 seconds Standing on One Leg: Able to lift leg independently and hold 5-10  seconds Total Score: 47  Dynamic Gait Index Level Surface: Moderate Impairment Change in Gait Speed: Moderate Impairment Gait with Horizontal Head Turns: Mild Impairment Gait with Vertical Head Turns: Mild Impairment Gait and Pivot Turn: Moderate Impairment Step Over Obstacle: Moderate Impairment Step Around Obstacles: Moderate Impairment Steps: Mild Impairment Total Score: 11  Dynamic Standing Balance Dynamic Standing - Balance Support: No upper extremity supported  Dynamic Standing - Level of Assistance: 4: Min assist Dynamic Standing - Balance Activities: Lateral lean/weight shifting;Forward lean/weight shifting;Reaching for objects;Reaching across midline Extremity/Trunk Assessment RUE Assessment RUE Assessment: Within Functional Limits LUE Assessment LUE Assessment: Within Functional Limits  See FIM for current functional status Refer to Care Plan for Long Term Goals  Recommendations for other services: None  Discharge Criteria: Patient will be discharged from OT if patient refuses treatment 3 consecutive times without medical reason, if treatment goals not met, if there is a change in medical status, if patient makes no progress towards goals or if patient is discharged from hospital.  The above assessment, treatment plan, treatment alternatives and goals were discussed and mutually agreed upon: by patient and by family  Yee Joss 05/21/2011, 1:29 PM

## 2011-05-21 NOTE — Evaluation (Signed)
Physical Therapy Assessment and Plan  Patient Details  Name: [name] MRN: [id number] Date of Birth: [birth year]  PT Diagnosis: Abnormality of gait, Difficulty walking, Hemiplegia non-dominant, Impaired sensation and Muscle weakness Rehab Potential: Excellent ELOS: 3-5 days   Today's Date: 05/21/2011 Time: 0900-1030 Time Calculation (min): 90 min  Problem List:  Patient Active Problem List  Diagnoses  . Atrial fibrillation  . Pericardial effusion  . Chronic anticoagulation  . HTN (hypertension)  . Edema  . Pacemaker  . Weakness of left side of body  . Elevated digoxin level  . GI bleed  . CVA (cerebral infarction)    Past Medical History:  Past Medical History  Diagnosis Date  . Pericardial effusion     Chronic. Last echo in April of 2012.   . Atrial fibrillation     Chronic  . Chronic anticoagulation     on Coumadin  . History of GI bleed   . HTN (hypertension)   . Sleep apnea   . Pacemaker     placed for tachybrady.   . Valvular heart disease   . Obesity   . LVH (left ventricular hypertrophy)    Past Surgical History:  Past Surgical History  Procedure Date  . Pacemaker insertion     Last generator in 2006  . Breast lumpectomy     bilaterally  . Tonsillectomy   . Adenoidectomy   . Esophagogastroduodenoscopy 05/19/2011    Procedure: ESOPHAGOGASTRODUODENOSCOPY (EGD);  Surgeon: Shirley Friar, MD;  Location: [facility] ENDOSCOPY;  Service: Endoscopy;  Laterality: N/A;  doctor aware of inr   will try to be here no later than 230    Assessment & Plan Clinical Impression: Patient is a 78 year old right-handed female with history of atrial fibrillation/pacemaker with chronic Coumadin therapy. Admitted April 20 left side weakness. 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 MRI could not be completed due to pacemaker. Carotid Dopplers with no ICA stenosis. Echocardiogram with ejection fraction of 70% without  emboli. Neurology consulted she remains on Coumadin therapy with admission INR 1.71. Gastroenterology consulted April 23 Dr.Schooler for maroon stool x4 without associated abdominal pain or nausea. There was also reported nosebleed April 18 that resolved on its own without recurrence. Underwent an endoscopy 05/19/2011 showing small benign appearing antral ulcerations without any bleeding. No blood products or recent bleeding seen. Suspect gastric findings unrelated to her melena. Felt recent epistaxis could be the source of the recent melena.  Patient transferred to CIR on 05/20/2011 .   Patient currently requires min with mobility secondary to muscle weakness, impaired sensation, impaired timing and sequencing and decreased coordination and decreased sitting balance, decreased standing balance, decreased postural control, hemiplegia and decreased balance strategies.  Prior to hospitalization, patient was independent with mobility and lived with Alone in a House home.  Home access is 2Stairs to enter with L rail and 12 stairs with L rail to access second level bed and bath; patient's desire is to return home alone.  Patient will benefit from skilled PT intervention to maximize safe functional mobility and minimize fall risk for planned discharge home alone.  Anticipate patient will benefit from follow up OP at discharge.  PT Evaluation Precautions/Restrictions Precautions Precautions: Fall;ICD/Pacemaker Precaution Comments: AFIB/PACEMAKER Pain Pain Assessment Pain Assessment: No/denies pain Home Living/Prior Functioning Home Living Lives With: Alone Available Help at Discharge: Family Type of Home: House Home Access: Stairs to enter [employer] of Steps: 2 Entrance Stairs-Rails: Left Home Layout: Two  level;Bed/bath upstairs Alternate Level Stairs-Number of Steps: 12 Alternate Level Stairs-Rails: Left Home Adaptive Equipment: None Prior Function Level of Independence: Independent  with transfers;Independent with gait;Independent with homemaking with ambulation;Independent with basic ADLs Able to Take Stairs?: Yes Driving: Yes Vision/Perception  Vision - History Patient Visual Report: No change from baseline Perception Perception: Within Functional Limits Praxis Praxis: Intact  Cognition Overall Cognitive Status: Appears within functional limits for tasks assessed Sensation Sensation Light Touch: Impaired Detail Light Touch Impaired Details: Impaired RLE;Impaired LLE Proprioception: Appears Intact Additional Comments: h/o neuropathy bilat LE Coordination Gross Motor Movements are Fluid and Coordinated: No Motor  Motor Motor: Hemiplegia Motor - Skilled Clinical Observations: L hemiplegia  Mobility Bed Mobility Sit to Supine: 6: Modified independent (Device/Increase time) Transfers Stand Pivot Transfers: 5: Supervision Stand Pivot Transfer Details: Verbal cues for precautions/safety;Verbal cues for technique;Visual cues/gestures for sequencing;Verbal cues for sequencing Stand Pivot Transfer Details (indicate cue type and reason): Patient able to perform bed <> chair transfers supervision with verbal cues for safety secondary to mild LOB with turning; demonstrated to patient how to perform floor > furniture transfer with attention to signs and symptoms of fracture or recurrent CVA; patient gave repeat demonstration of floor > mat transfer with supervision and verbal cues for sequence Locomotion  Ambulation Ambulation/Gait Assistance: 4: Min assist Ambulation Distance (Feet): 300 Feet Assistive device: None;Rolling walker Ambulation/Gait Assistance Details: Visual cues/gestures for precautions/safety;Verbal cues for precautions/safety;Verbal cues for safe use of DME/AE Ambulation/Gait Assistance Details: On level surface, controlled environment patient performed gait with min A secondary to LOB to L and L knee instability; presents with decreased L knee and hip  extension and decreased stance time on L with slightly retracted trunk and pelvis; required min A and verbal cues for safety during higher level gait challenges secondary to increased LOB to L; patient requesting to go to bathroom on her own; observed patient ambulate in hallway and room with RW with decreased incidence of LOB during stops and changes in direction.  Patient cleared to ambulate to and from toilet with RW mod I during day only. Stairs / Additional Locomotion Stairs: Yes Stairs Assistance: 4: Min assist Stairs Assistance Details: Visual cues/gestures for precautions/safety;Verbal cues for sequencing;Verbal cues for precautions/safety Stairs Assistance Details (indicate cue type and reason): Performed full flight of stairs with L rail to mimic home; required min A secondary to L knee and hip weakness and instability. Stair Management Technique: One rail Left;Alternating pattern;Forwards Number of Stairs: 12  Height of Stairs: 8  Wheelchair Mobility Wheelchair Mobility: No  Trunk/Postural Assessment  Cervical Assessment Cervical Assessment: Within Functional Limits Thoracic Assessment Thoracic Assessment: Within Functional Limits Lumbar Assessment Lumbar Assessment: Within Functional Limits Postural Control Postural Control: Deficits on evaluation Righting Reactions: LOB to L during gait but able to self correct with stepping strategy with min A  Balance Standardized Balance Assessment Standardized Balance Assessment: Berg Balance Test;Dynamic Gait Index Berg Balance Test Sit to Stand: Able to stand without using hands and stabilize independently Standing Unsupported: Able to stand safely 2 minutes Sitting with Back Unsupported but Feet Supported on Floor or Stool: Able to sit safely and securely 2 minutes Stand to Sit: Sits safely with minimal use of hands Transfers: Able to transfer safely, minor use of hands Standing Unsupported with Eyes Closed: Able to stand 10 seconds  with supervision Standing Ubsupported with Feet Together: Able to place feet together independently and stand 1 minute safely From Standing, Reach Forward with Outstretched Arm: Can reach confidently >25 cm (  10") From Standing Position, Pick up Object from Floor: Able to pick up shoe, needs supervision From Standing Position, Turn to Look Behind Over each Shoulder: Looks behind from both sides and weight shifts well Turn 360 Degrees: Needs close supervision or verbal cueing Standing Unsupported, Alternately Place Feet on Step/Stool: Able to stand independently and complete 8 steps >20 seconds Standing Unsupported, One Foot in Front: Able to take small step independently and hold 30 seconds Standing on One Leg: Able to lift leg independently and hold 5-10 seconds Total Score: 47 Patient demonstrates moderate fall risk as noted by score of 47/56 on Berg Balance Scale.  (<36= high risk for falls, close to 100%; 37-45 significant >80%; 46-51 moderate >50%; 52-55 lower >25%)  Dynamic Gait Index Level Surface: Moderate Impairment Change in Gait Speed: Moderate Impairment Gait with Horizontal Head Turns: Mild Impairment Gait with Vertical Head Turns: Mild Impairment Gait and Pivot Turn: Moderate Impairment Step Over Obstacle: Moderate Impairment Step Around Obstacles: Moderate Impairment Steps: Mild Impairment Total Score: 11 with a score of less than 19/24 predictive of falls in community dwelling older individuals.  Extremity Assessment  RLE Assessment RLE Assessment: Within Functional Limits LLE Assessment LLE Assessment: Exceptions to [facility] LLE Strength LLE Overall Strength: Deficits Left Hip Flexion: 3-/5 Left Hip Extension: 4/5 Left Knee Flexion: 3+/5 Left Knee Extension: 4/5 Left Ankle Dorsiflexion: 4/5  See FIM for current functional status Refer to Care Plan for Long Term Goals  Recommendations for other services: None  Discharge Criteria: Patient will be discharged from PT if  patient refuses treatment 3 consecutive times without medical reason, if treatment goals not met, if there is a change in medical status, if patient makes no progress towards goals or if patient is discharged from hospital.  The above assessment, treatment plan, treatment alternatives and goals were discussed and mutually agreed upon: by patient and by family  Duncan Desanctis 05/21/2011, 10:51 AM

## 2011-05-21 NOTE — Progress Notes (Signed)
Occupational Therapy Session Note  Patient Details  Name: TUANA HOHEISEL MRN: [id number] Date of Birth: 04-Oct-1923  Today's Date: 05/21/2011 Time: 6962-9528 Time Calculation (min): 40 min  Short Term Goals: No Short Term goals set  Skilled Therapeutic Interventions/Progress Updates:  1:1 OT with focus on transfers, functional mobility with and without RW, and higher level activities to challenge balance.  Engaged in tub transfer with stepping over side with use of grab bar, pt required min/steady assist secondary to height of tub and requiring extra effort to clear side with LLE.  Cues for pt to slow down and take her time with mobility.  Pt able to ask for use of RW when felt that her LLE was fatiguing.  Completed horse shoe tossing activity with focus on dynamic standing balance and bending to retrieve horse shoes while not losing her balance.    Therapy Documentation Precautions:  Precautions Precautions: Fall;ICD/Pacemaker Precaution Comments: AFIB/PACEMAKER Restrictions Weight Bearing Restrictions: No General: General Chart Reviewed: Yes Family/Caregiver Present: Yes Vital Signs:   Pain:   No c/o pain this session.  See FIM for current functional status  Therapy/Group: Individual Therapy  Leonette Monarch 05/21/2011, 3:14 PM

## 2011-05-21 NOTE — Progress Notes (Signed)
Social Work Assessment and Plan Social Work Assessment and Plan  Patient Details  Name: [name] MRN: [id number] Date of Birth: 1923-07-12  Today's Date: 05/21/2011  Problem List:  Patient Active Problem List  Diagnoses  . Atrial fibrillation  . Pericardial effusion  . Chronic anticoagulation  . HTN (hypertension)  . Edema  . Pacemaker  . Weakness of left side of body  . Elevated digoxin level  . GI bleed  . CVA (cerebral infarction)   Past Medical History:  Past Medical History  Diagnosis Date  . Pericardial effusion     Chronic. Last echo in April of 2012.   . Atrial fibrillation     Chronic  . Chronic anticoagulation     on Coumadin  . History of GI bleed   . HTN (hypertension)   . Sleep apnea   . Pacemaker     placed for tachybrady.   . Valvular heart disease   . Obesity   . LVH (left ventricular hypertrophy)    Past Surgical History:  Past Surgical History  Procedure Date  . Pacemaker insertion     Last generator in 2006  . Breast lumpectomy     bilaterally  . Tonsillectomy   . Adenoidectomy   . Esophagogastroduodenoscopy 05/19/2011    Procedure: ESOPHAGOGASTRODUODENOSCOPY (EGD);  Surgeon: Shirley Friar, MD;  Location: [facility] ENDOSCOPY;  Service: Endoscopy;  Laterality: N/A;  doctor aware of inr   will try to be here no later than 230   Social History:  reports that she has never smoked. She has never used smokeless tobacco. She reports that she does not drink alcohol or use illicit drugs.  Family / Support Systems Marital Status: Widow/Widower Patient Roles: Parent Children: Lupita Leash Cathey-daughter  [phone] Anticipated Caregiver: Home to her home versus daughter's or ALF Ability/Limitations of Caregiver: Daughter has mild CP but very independent.  Daughter's all willing to assist pt Caregiver Availability: Intermittent Family Dynamics: Daughter's are all involved and supportive.  Pt is very stubborn and hard headed and only let the  daughter's do so much for her.  Issue is cleaning out her home-and pt is resistant to this.  Social History Preferred language: English Religion: Baptist Cultural Background: No issues Education: [employer] Read: Yes Write: Yes Employment Status: Retired Fish farm manager Issues: No issues Guardian/Conservator: None   Abuse/Neglect Physical Abuse: Denies Verbal Abuse: Denies Sexual Abuse: Denies Exploitation of patient/patient's resources: Denies Self-Neglect: Denies  Emotional Status Pt's affect, behavior adn adjustment status: Pt is motivated but also grateful she has done so well with this stroke.  She wants to be back home but understands the need for rehab.  Daughter is here and supportive. Recent Psychosocial Issues: Other meidcal issues Pyschiatric History: No history- deferred Depression Screen due to coping appropriately with her stroke and deficits. Substance Abuse History: No issues  Patient / Family Perceptions, Expectations & Goals Pt/Family understanding of illness & functional limitations: Pt and daughter are able to explain her stroke and deficits.  Pt feels her balance has mainly been affected.  She is willing to stay and work on this. Premorbid pt/family roles/activities: Mom, Grandmother, Retiree, Home owner, Church member, etc Anticipated changes in roles/activities/participation: Resume Pt/family expectations/goals: Pt states: " I want to go home soon."  Daughter expressed concerns regarding pt's home being safe and her ability to move around it due too much stuff.  Daughter states: " She may go to [place] for a short time then home."  [facility]  Community Agencies: None Premorbid Home Care/DME Agencies: None Transportation available at discharge: Family Resource referrals recommended: Support group (specify) (CVA Support Group)  Discharge Planning Living Arrangements: Alone Support Systems:  Children;Friends/neighbors;Church/faith community Type of Residence: Private residence Insurance Resources: Administrator (specify) Manufacturing systems engineer) Financial Resources: Restaurant manager, fast food Screen Referred: No Living Expenses: Own Money Management: Patient Do you have any problems obtaining your medications?: No Home Management: Patient Patient/Family Preliminary Plans: Either go to her home or daughter's or another possibility go to [place] for a short time Social Work Anticipated Follow Up Needs: HH/OP;Support Group DC Planning Additional Notes/Comments: [employer] coming to speak with pt and daughter regarding questions or concerns/availability  Clinical Impression Pleasant female who is fortunate to be recovering so quick.  Daughter concerns regarding pt's home and clutter.  Pursuing ALF short term for the transition.  Lucy Chris 05/21/2011, 11:31 AM

## 2011-05-21 NOTE — Progress Notes (Signed)
Patient ID: [name], female   DOB: 1923-10-10, 78 y.o.   MRN: [id number] HPI: 77 year old right-handed female with history of atrial fibrillation/pacemaker with chronic Coumadin therapy. Admitted April 20 left side weakness. Cranial CT scan shows small acute nonhemorrhagic mid to posterior right frontal lobe infarction  Subjective/Complaints: L post thigh pain Can IV come out + constipation RUQ pain with deep breath Objective: Vital Signs: Blood pressure 137/74, pulse 76, temperature 98.7 F (37.1 C), temperature source Oral, resp. rate 18, SpO2 95.00%. No results found. Results for orders placed during the hospital encounter of 05/20/11 (from the past 72 hour(s))  CBC     Status: Abnormal   Collection Time   05/21/11  6:17 AM      Component Value Range Comment   WBC 6.2  4.0 - 10.5 (K/uL)    RBC 3.87  3.87 - 5.11 (MIL/uL)    Hemoglobin 11.4 (*) 12.0 - 15.0 (g/dL)    HCT 40.9 (*) 81.1 - 46.0 (%)    MCV 90.2  78.0 - 100.0 (fL)    MCH 29.5  26.0 - 34.0 (pg)    MCHC 32.7  30.0 - 36.0 (g/dL)    RDW 91.4  78.2 - 95.6 (%)    Platelets 275  150 - 400 (K/uL)   DIFFERENTIAL     Status: Abnormal   Collection Time   05/21/11  6:17 AM      Component Value Range Comment   Neutrophils Relative 63  43 - 77 (%)    Neutro Abs 3.9  1.7 - 7.7 (K/uL)    Lymphocytes Relative 21  12 - 46 (%)    Lymphs Abs 1.3  0.7 - 4.0 (K/uL)    Monocytes Relative 9  3 - 12 (%)    Monocytes Absolute 0.6  0.1 - 1.0 (K/uL)    Eosinophils Relative 6 (*) 0 - 5 (%)    Eosinophils Absolute 0.4  0.0 - 0.7 (K/uL)    Basophils Relative 1  0 - 1 (%)    Basophils Absolute 0.0  0.0 - 0.1 (K/uL)   PROTIME-INR     Status: Abnormal   Collection Time   05/21/11  6:17 AM      Component Value Range Comment   Prothrombin Time 19.0 (*) 11.6 - 15.2 (seconds)    INR 1.56 (*) 0.00 - 1.49       Physical Exam  Vitals reviewed.  Constitutional: She is oriented to person, place, and time. She appears well-developed.  HENT:    Head: Normocephalic. PERRL, EOMI  Neck: No thyromegaly present.  Cardiovascular:  Cardiac rate controlled, positive murmur. Reg rhythm.  Pulmonary/Chest: Clear to auscultation. No murmurs rubs or gallops  Abdominal: She exhibits no distension. There is no tenderness.  Musculoskeletal: She exhibits no edema. Tenderness at hamstrings Neurological: She is alert and oriented to person, place, and time.  Skin: Skin is warm and dry.  Psychiatric: She has a normal mood and affect. Cognition quite appropriate  motor strength is 5/5 in the right deltoid, biceps, triceps, grip, right hip flexor, knee extensor right ankle dorsiflexor and plantar flexor  On the left side she has 4+/5 in the left deltoid, biceps, triceps,grip 5. 3+ hip, 4/5 ADF/APF, 4/5 knee. Mild left pronator drift. Mild rhomberg  Sensation is intact to light touch in bilateral upper and bilateral lower extremities  Tone is normal in the right side and the left upper extremity. Tone is reduced in the left lower extremity  Cranial nerves  show no evidence of facial droop no extraocular muscle weakness   Assessment/Plan: 1. Functional deficits secondary to R Frontal infarct with L hemiparesis which require 3+ hours per day of interdisciplinary therapy in a comprehensive inpatient rehab setting. Physiatrist is providing close team supervision and 24 hour management of active medical problems listed below. Physiatrist and rehab team continue to assess barriers to discharge/monitor patient progress toward functional and medical goals. FIM:       FIM - Toileting Toileting steps completed by patient: Adjust clothing prior to toileting;Performs perineal hygiene;Adjust clothing after toileting Toileting: 5: Supervision: Safety issues/verbal cues  FIM - Archivist Transfers Assistive Devices: Elevated toilet seat Toilet Transfers: 5-To toilet/BSC: Supervision (verbal cues/safety issues);5-From toilet/BSC: Supervision (verbal  cues/safety issues)  FIM - Banker Devices: Therapist, occupational: 5: Bed > Chair or W/C: Supervision (verbal cues/safety issues);5: Chair or W/C > Bed: Supervision (verbal cues/safety issues)     Comprehension Comprehension Mode: Auditory Comprehension: 7-Follows complex conversation/direction: With no assist  Expression Expression Mode: Verbal Expression: 7-Expresses complex ideas: With no assist  Social Interaction Social Interaction: 7-Interacts appropriately with others - No medications needed.  Problem Solving Problem Solving: 6-Solves complex problems: With extra time  Memory Memory: 6-More than reasonable amt of time   Medical Problem List and Plan:  1. 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 DVT Prophylaxis/Anticoagulation: Chronic Coumadin therapy. INR 1.86 05/20/2011 1.56 today. Monitor for any signs of bleeding  3. Hypertension/atrial fibrillation. Demadex 20 mg daily, verapamil 180 mg each bedtime and Lanoxin 0.25 mg daily. Monitor with increased mobility. Heart rate is controlled.  4. Tachybradycardia syndrome. History of pacemaker 2006. HR controlled at present  5. History of GI bleed. Recent maroon-colored stools. EGD without active bleeding. Protonic since initiated. Follow serial cbc's. Recent hgb 11.4  6. Epistaxis. Occurrence x1 on 05/14/2011. No further bleeding-follow clinically 7.  L hamstring strain sports cream LOS (Days) 1 A FACE TO FACE EVALUATION WAS PERFORMED  Calab Sachse E 05/21/2011, 7:19 AM

## 2011-05-21 NOTE — Progress Notes (Signed)
Patient information reviewed and entered into [facility] system by Trampas Stettner, RN, CRRN, PPS Coordinator.  Information including medical coding and functional independence measure will be reviewed and updated through discharge.     Per nursing patient was given "Data Collection Information Summary for Patients in Inpatient Rehabilitation Facilities with attached "Privacy Act Statement-Health Care Records" upon admission.   

## 2011-05-22 LAB — PROTIME-INR: Prothrombin Time: 20.4 seconds — ABNORMAL HIGH (ref 11.6–15.2)

## 2011-05-22 MED ORDER — WARFARIN SODIUM 5 MG PO TABS
5.0000 mg | ORAL_TABLET | Freq: Once | ORAL | Status: AC
  Administered 2011-05-22: 5 mg via ORAL
  Filled 2011-05-22: qty 1

## 2011-05-22 MED ORDER — TUBERCULIN PPD 5 UNIT/0.1ML ID SOLN
5.0000 [IU] | Freq: Once | INTRADERMAL | Status: DC
  Filled 2011-05-22 (×2): qty 0.1

## 2011-05-22 NOTE — Progress Notes (Signed)
ANTICOAGULATION CONSULT NOTE - Follow Up Consult  Pharmacy Consult:  Coumadin Indication: atrial fibrillation  No Known Allergies  Patient Measurements: Weight = 74.4 kg  Vital Signs: Temp: 98.2 F (36.8 C) (04/26 0608) Temp src: Oral (04/26 0608) BP: 126/54 mmHg (04/26 0608) Pulse Rate: 67  (04/26 0608)  Labs:  Alvira Philips 05/22/11 0625 05/21/11 0617 05/20/11 0635 05/19/11 1347  HGB -- 11.4* 11.4* --  HCT -- 34.9* 34.8* 39.6  PLT -- 275 280 314  APTT -- -- -- --  LABPROT 20.4* 19.0* 21.8* --  INR 1.71* 1.56* 1.86* --  HEPARINUNFRC -- -- -- --  CREATININE -- 0.85 -- --  CKTOTAL -- -- -- --  CKMB -- -- -- --  TROPONINI -- -- -- --   The CrCl is unknown because both a height and weight (above a minimum accepted value) are required for this calculation.    Assessment: 78 yr old female admitted with nonhemorrhagic CVA.  Pt has had some maroon stools but scope showed no bleeding, so okay to resume Coumadin per GI.  INR trended down further d/t Coumadin being held x 2 days. INR today 1.71 No further bleeding reported.  Was on Coumadin 5mg  PO daily except 2.5mg  on Mon and Fri PTA.   Goal of Therapy:  INR 2-2.5    Plan:  - Repeat Coumadin 5mg  PO today - Daily PT/INR - Monitor for s/sx of bleeding  Talbert Cage, [name] Pager:  [phone]       05/22/2011, 10:53 AM

## 2011-05-22 NOTE — Progress Notes (Addendum)
Physical Therapy Session Note  Patient Details  Name: [name] MRN: [id number] Date of Birth: 1923/12/25  Today's Date: 05/22/2011 Time: 1191-4782 and 9562-1308 Time Calculation (min): 55 min and 40 min   Short Term Goals: Week 1:  PT Short Term Goal 1 (Week 1): = LTG secondary to short LOS  Therapy Documentation Precautions:  Precautions Precautions: Fall;ICD/Pacemaker Precaution Comments: AFIB/PACEMAKER Restrictions Weight Bearing Restrictions: No Locomotion : Ambulation Ambulation/Gait Assistance: 5: Supervision  Exercises:  Patient performed 1 set x 15-20 reps standing LE strengthening and balance HEP with bilat UE support on tall table after handout given and each exercise demonstrated for patient with verbal and tactile cues for upright trunk posture and activation of LLE extensors for stability in stance position. Other Treatments: Treatments Therapeutic Activity: Patient performed 2 reps toilet transfers with RW and supervision.  Dynamic balance training with ankle and hip strategy muscle training with 2 reps heel-toe walking with one UE support forwards and backwards and 10 reps sit to stand from chair without UE support with focus on L anterior lean during sit <> stand to increase activation of LLE extensors for concentric and eccentric control  PM session: Continued to focus on LLE hip and knee muscle strengthening in functional WB position with 2 sets x 10 reps forward, L lateral and retro step ups on 6" step with min-mod A for terminal hip and trunk extension and one sitting rest break.  Patient continues to have episodes of LOB during direction changes; gait training without RW while weaving L and R around cones x 25' x 2 reps with min-mod A; patient with LOB during L turning; attempted RLE stepping in various directions and across midline to focus on pivoting/transverse plane movement but patient unable to secondary to LE fatigue.  Patient reports that she will D/C  Monday to ALF temporarily prior to returning home; ordered RW for patient to maximize safety and independence with mobility.   See FIM for current functional status  Therapy/Group: Individual Therapy  Edman Circle [facility] 05/22/2011, 12:47 PM

## 2011-05-22 NOTE — Progress Notes (Signed)
Patient ID: [name], female   DOB: March 29, 1923, 78 y.o.   MRN: [id number] HPI: 78 year old right-handed female with history of atrial fibrillation/pacemaker with chronic Coumadin therapy. Admitted April 20 left side weakness. Cranial CT scan shows small acute nonhemorrhagic mid to posterior right frontal lobe infarction  Subjective/Complaints: L post thigh pain Can IV come out + constipation RUQ pain with deep breath Objective: Vital Signs: Blood pressure 126/54, pulse 67, temperature 98.2 F (36.8 C), temperature source Oral, resp. rate 16, SpO2 96.00%. No results found. Results for orders placed during the hospital encounter of 05/20/11 (from the past 72 hour(s))  CBC     Status: Abnormal   Collection Time   05/21/11  6:17 AM      Component Value Range Comment   WBC 6.2  4.0 - 10.5 (K/uL)    RBC 3.87  3.87 - 5.11 (MIL/uL)    Hemoglobin 11.4 (*) 12.0 - 15.0 (g/dL)    HCT 40.9 (*) 81.1 - 46.0 (%)    MCV 90.2  78.0 - 100.0 (fL)    MCH 29.5  26.0 - 34.0 (pg)    MCHC 32.7  30.0 - 36.0 (g/dL)    RDW 91.4  78.2 - 95.6 (%)    Platelets 275  150 - 400 (K/uL)   COMPREHENSIVE METABOLIC PANEL     Status: Abnormal   Collection Time   05/21/11  6:17 AM      Component Value Range Comment   Sodium 140  135 - 145 (mEq/L)    Potassium 3.5  3.5 - 5.1 (mEq/L)    Chloride 100  96 - 112 (mEq/L)    CO2 29  19 - 32 (mEq/L)    Glucose, Bld 119 (*) 70 - 99 (mg/dL)    BUN 24 (*) 6 - 23 (mg/dL)    Creatinine, Ser 2.13  0.50 - 1.10 (mg/dL)    Calcium 9.1  8.4 - 10.5 (mg/dL)    Total Protein 6.3  6.0 - 8.3 (g/dL)    Albumin 3.2 (*) 3.5 - 5.2 (g/dL)    AST 20  0 - 37 (U/L)    ALT 16  0 - 35 (U/L)    Alkaline Phosphatase 74  39 - 117 (U/L)    Total Bilirubin 0.5  0.3 - 1.2 (mg/dL)    GFR calc non Af Amer 60 (*) >90 (mL/min)    GFR calc Af Amer 70 (*) >90 (mL/min)   DIFFERENTIAL     Status: Abnormal   Collection Time   05/21/11  6:17 AM      Component Value Range Comment   Neutrophils Relative  63  43 - 77 (%)    Neutro Abs 3.9  1.7 - 7.7 (K/uL)    Lymphocytes Relative 21  12 - 46 (%)    Lymphs Abs 1.3  0.7 - 4.0 (K/uL)    Monocytes Relative 9  3 - 12 (%)    Monocytes Absolute 0.6  0.1 - 1.0 (K/uL)    Eosinophils Relative 6 (*) 0 - 5 (%)    Eosinophils Absolute 0.4  0.0 - 0.7 (K/uL)    Basophils Relative 1  0 - 1 (%)    Basophils Absolute 0.0  0.0 - 0.1 (K/uL)   PROTIME-INR     Status: Abnormal   Collection Time   05/21/11  6:17 AM      Component Value Range Comment   Prothrombin Time 19.0 (*) 11.6 - 15.2 (seconds)    INR 1.56 (*)  0.00 - 1.49       Physical Exam  Vitals reviewed.  Constitutional: She is oriented to person, place, and time. She appears well-developed.  HENT:  Head: Normocephalic. PERRL, EOMI  Neck: No thyromegaly present.  Cardiovascular:  Cardiac rate controlled, positive murmur. Reg rhythm.  Pulmonary/Chest: Clear to auscultation. No murmurs rubs or gallops  Abdominal: She exhibits no distension. There is no tenderness.  Musculoskeletal: She exhibits no edema. Tenderness at hamstrings Neurological: She is alert and oriented to person, place, and time.  Skin: Skin is warm and dry.  Psychiatric: She has a normal mood and affect. Cognition quite appropriate  motor strength is 5/5 in the right deltoid, biceps, triceps, grip, right hip flexor, knee extensor right ankle dorsiflexor and plantar flexor  On the left side she has 4+/5 in the left deltoid, biceps, triceps,grip 5. 3+ hip, 4/5 ADF/APF, 4/5 knee. Mild left pronator drift. Mild rhomberg  Sensation is intact to light touch in bilateral upper and bilateral lower extremities  Tone is normal in the right side and the left upper extremity. Tone is reduced in the left lower extremity  Cranial nerves show no evidence of facial droop no extraocular muscle weakness   Assessment/Plan: 1. Functional deficits secondary to R Frontal infarct with L hemiparesis which require 3+ hours per day of interdisciplinary  therapy in a comprehensive inpatient rehab setting. Physiatrist is providing close team supervision and 24 hour management of active medical problems listed below. Physiatrist and rehab team continue to assess barriers to discharge/monitor patient progress toward functional and medical goals. FIM: FIM - Bathing Bathing Steps Patient Completed: Chest;Right Arm;Left Arm;Abdomen;Front perineal area;Buttocks;Right lower leg (including foot);Left upper leg;Right upper leg;Left lower leg (including foot) Bathing: 5: Supervision: Safety issues/verbal cues  FIM - Upper Body Dressing/Undressing Upper body dressing/undressing steps patient completed: Thread/unthread right bra strap;Thread/unthread left bra strap;Thread/unthread right sleeve of pullover shirt/dresss;Thread/unthread left sleeve of pullover shirt/dress;Put head through opening of pull over shirt/dress;Pull shirt over trunk (sports bra) Upper body dressing/undressing: 5: Set-up assist to: Obtain clothing/put away FIM - Lower Body Dressing/Undressing Lower body dressing/undressing steps patient completed: Thread/unthread right underwear leg;Pull underwear up/down;Thread/unthread right pants leg;Thread/unthread left pants leg;Pull pants up/down;Thread/unthread left underwear leg;Fasten/unfasten pants;Don/Doff right sock;Don/Doff left sock;Don/Doff right shoe;Don/Doff left shoe;Fasten/unfasten right shoe;Fasten/unfasten left shoe Lower body dressing/undressing: 5: Supervision: Safety issues/verbal cues  FIM - Toileting Toileting steps completed by patient: Adjust clothing prior to toileting;Performs perineal hygiene;Adjust clothing after toileting Toileting: 5: Supervision: Safety issues/verbal cues  FIM - Archivist Transfers Assistive Devices: Art gallery manager Transfers: 5-From toilet/BSC: Supervision (verbal cues/safety issues);5-To toilet/BSC: Supervision (verbal cues/safety issues) (supervision at [facility])  FIM - Bed/Chair  Transfer Bed/Chair Transfer Assistive Devices: Therapist, occupational: 6: Supine > Sit: No assist;6: Sit > Supine: No assist;5: Bed > Chair or W/C: Supervision (verbal cues/safety issues);5: Chair or W/C > Bed: Supervision (verbal cues/safety issues)  FIM - Locomotion: Wheelchair Locomotion: Wheelchair: 0: Activity did not occur FIM - Locomotion: Ambulation Ambulation/Gait Assistance: 4: Min assist Locomotion: Ambulation: 4: Travels 150 ft or more with minimal assistance (Pt.>75%)  Comprehension Comprehension Mode: Auditory Comprehension: 7-Follows complex conversation/direction: With no assist  Expression Expression Mode: Verbal Expression: 7-Expresses complex ideas: With no assist  Social Interaction Social Interaction: 7-Interacts appropriately with others - No medications needed.  Problem Solving Problem Solving: 6-Solves complex problems: With extra time  Memory Memory: 6-Assistive device: No helper   Medical Problem List and Plan:  1. 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 DVT Prophylaxis/Anticoagulation: Chronic Coumadin therapy.pharmacy dosing  Monitor for any signs of bleeding  3. Hypertension/atrial fibrillation. Demadex 20 mg daily, verapamil 180 mg each bedtime and Lanoxin 0.25 mg daily. Monitor with increased mobility. Heart rate is controlled.  4. Tachybradycardia syndrome. History of pacemaker 2006. HR controlled at present  5. History of GI bleed. Recent maroon-colored stools. EGD without active bleeding. Protonic since initiated. Follow serial cbc's. Recent hgb 11.4  6. Epistaxis. Occurrence x1 on 05/14/2011. No further bleeding-follow clinically 7.  L hamstring strain sports cream LOS (Days) 2 A FACE TO FACE EVALUATION WAS PERFORMED  Charnay Nazario E 05/22/2011, 6:15 AM

## 2011-05-22 NOTE — Progress Notes (Signed)
Patient ID: [name], female   DOB: [birth year], 78 y.o.   MRN: [id number]  H. Pylori serology positive. Due to drug interactions with digoxin and coumadin from Clarithromycin and Amoxicillin I think risk to her drug levels outweighs the benefit in treating her H. Pylori despite tiny ulcerations seen on EGD. Continue daily PPI indefinitely.

## 2011-05-22 NOTE — Progress Notes (Signed)
Social Work Patient ID: [name], female   DOB: [birth year], 78 y.o.   MRN: [id number] Met with pt and daughter to discuss discharge plans.  Pt plans to go to a ALF for a week or two before returning Home, so daughter's can clean out her home.  Have contacted [facility] and plans to go there.  I have Also contacted the facility to inquire about paperwork they require for admission.  Completing FL2 and TB test ordered. Pt and daughter pleased with the plan.  Continue to work on discharge plans for Monday.

## 2011-05-22 NOTE — Progress Notes (Signed)
Occupational Therapy Session Note  Patient Details  Name: NAHIMA ALES MRN: [id number] Date of Birth: [birth year]  Today's Date: 05/22/2011 Visit 1: Time: 0800-0900 Time Calculation (min): 60 min  Visit 2: Time: 1100-1130 (30 minutes)  Short Term Goals= LTGs  Skilled Therapeutic Interventions/Progress Updates:   Visit 1:   Pt seen for BADL retraining of bathing and dressing with a focus on pt completing tasks at a mod I level without use of RW demonstrating improved dynamic balance.  Today pt demonstrated improved LLE strength with mobility as pt able to ambulate about room, retreive clothing from drawers, and ambulate to bathroom without RW with supervision.  Mod I with bathing and dressing.  Pt is more independent with RW, but desires to build her strength without it.  Ambulated to day room as pt held empty coffee cup with supervision, pt made her own coffee, but needed assistance carrying full coffee cup back to her room as she has an essential tremor in right hand.  Visit 2:  Pt seen with her daughter this session for tub transfers and kitchen mobility and safety.  Pt ambulated from her room to the day room without RW with one LOB as she tried to quickly step to the right out of the way of an opening door.  Pt is very insistant on sitting down into tub without a seat.  We worked on this transfer and pt able to complete with supervision (simulated with clothes on).  Discussed at length safety considerations (grab bars, tub mat, only completing transfer with family member present).  Pt worked on Therapist, music tasks and discussed safety issues, how to stagger her stance to increase standing balance, using UE support if lifting heavy objects off the floor.      Therapy Documentation Precautions:  Precautions Precautions: Fall;ICD/Pacemaker Precaution Comments: AFIB/PACEMAKER Restrictions Weight Bearing Restrictions: No   Pain: Pain Assessment Pain Assessment: No/denies pain     See FIM for current functional status  Therapy/Group: Individual Therapy  Marcquis Ridlon 05/22/2011, 8:57 AM

## 2011-05-23 DIAGNOSIS — I6330 Cerebral infarction due to thrombosis of unspecified cerebral artery: Secondary | ICD-10-CM

## 2011-05-23 DIAGNOSIS — Z5189 Encounter for other specified aftercare: Secondary | ICD-10-CM

## 2011-05-23 LAB — PROTIME-INR: INR: 1.77 — ABNORMAL HIGH (ref 0.00–1.49)

## 2011-05-23 MED ORDER — WARFARIN SODIUM 5 MG PO TABS
5.0000 mg | ORAL_TABLET | Freq: Once | ORAL | Status: AC
  Administered 2011-05-23: 5 mg via ORAL
  Filled 2011-05-23: qty 1

## 2011-05-23 NOTE — Progress Notes (Signed)
Occupational Therapy Session Note  Patient Details  Name: [name] MRN: [id number] Date of Birth: Jun 09, 1923  Today's Date: 05/23/2011 Time: 4098-1191 Time Calculation (min): 60 min  Skilled Therapeutic Interventions/Progress Updates: ADL standing and distant S to Mod I level.  Patient really motivated to complete ADL on her own.  ALso functional mobility with HHA to wash clothes.  2nd session challenged balance activities reaches and bending low.  Also watered plants and bent to floor to pick up items off floor with close S and no loss of balance.  Completed endurance activities and functional mobility activities as well.     Therapy Documentation Precautions:  Precautions Precautions: Fall;ICD/Pacemaker Precaution Comments: AFIB/PACEMAKER Restrictions Weight Bearing Restrictions: No  Pain: no c/os    See FIM for current functional status  Therapy/Group: Individual Therapy  Bud Face [facility] 05/23/2011, 4:00 PM

## 2011-05-23 NOTE — Progress Notes (Signed)
ANTICOAGULATION CONSULT NOTE - Follow Up Consult  Pharmacy Consult:  Coumadin Indication: atrial fibrillation  No Known Allergies  Patient Measurements: Weight = 74.4 kg  Vital Signs: Temp: 97.9 F (36.6 C) (04/27 0530) Temp src: Oral (04/27 0530) BP: 112/68 mmHg (04/27 0530) Pulse Rate: 67  (04/27 0530)  Labs:  Basename 05/23/11 0500 05/22/11 0625 05/21/11 0617  HGB -- -- 11.4*  HCT -- -- 34.9*  PLT -- -- 275  APTT -- -- --  LABPROT 20.9* 20.4* 19.0*  INR 1.77* 1.71* 1.56*  HEPARINUNFRC -- -- --  CREATININE -- -- 0.85  CKTOTAL -- -- --  CKMB -- -- --  TROPONINI -- -- --   The CrCl is unknown because both a height and weight (above a minimum accepted value) are required for this calculation.    Assessment: 78 yr old female admitted with nonhemorrhagic CVA.  Pt has had some maroon stools but scope showed no bleeding, so okay to resume Coumadin per GI.  INR < goal d/t Coumadin being held x 2 days. INR today 1.77 (trending up) No further bleeding reported.  Was on Coumadin 5mg  PO daily except 2.5mg  on Mon and Fri PTA.   Goal of Therapy:  INR 2-2.5    Plan:  - Repeat Coumadin 5mg  PO today - Daily PT/INR - Monitor for s/sx of bleeding  Talbert Cage, [name] Pager:  [phone]       05/23/2011, 9:36 AM

## 2011-05-23 NOTE — Progress Notes (Signed)
BLE's with 1 plus pitting edema. [name]

## 2011-05-23 NOTE — Progress Notes (Signed)
Physical Therapy Session Note  Patient Details  Name: [name] MRN: [id number] Date of Birth: 10-24-1923  Today's Date: 05/23/2011 Time: 1305-1400 Time Calculation (min): 55 min  Short Term Goals: Week 1:  PT Short Term Goal 1 (Week 1): = LTG secondary to short LOS  Skilled Therapeutic Interventions/Progress Updates:  Pt participated in Stride Right Walking Group for gait training, therex for strengthening, balance, and activity tolerance. Pt ambulate 200' with no device and S in controlled environment and for 400' on treadmill up to 1.69ft/sec with bil UE support over with no rest. Nu step x10 min at level 6. Dynamic balance tasks includign gait with marching, side stepping, retro-walking all x20' and step-taps x10 bil to 4" step, all with HHA for balance.Seated therex 2x10 marching and LAW with cues for technique.      Therapy Documentation Precautions:  Precautions Precautions: Fall;ICD/Pacemaker Precaution Comments: AFIB/PACEMAKER Restrictions Weight Bearing Restrictions: No Pain: None   Mobility: See FIM for current functional status  Therapy/Group: Group Therapy  Shameer Molstad M 05/23/2011, 2:08 PM

## 2011-05-23 NOTE — Progress Notes (Signed)
Patient ID: [name], female   DOB: [birth year], 78 y.o.   MRN: [id number] Patient ID: [name], female   DOB: 01-16-1924, 78 y.o.   MRN: [id number] Subjective/Complaints: L post thigh pain Can IV come out + constipation RUQ pain with deep breath Objective: Vital Signs: Blood pressure 112/68, pulse 67, temperature 97.9 F (36.6 C), temperature source Oral, resp. rate 18, SpO2 93.00%. No results found. Results for orders placed during the hospital encounter of 05/20/11 (from the past 72 hour(s))  CBC     Status: Abnormal   Collection Time   05/21/11  6:17 AM      Component Value Range Comment   WBC 6.2  4.0 - 10.5 (K/uL)    RBC 3.87  3.87 - 5.11 (MIL/uL)    Hemoglobin 11.4 (*) 12.0 - 15.0 (g/dL)    HCT 78.2 (*) 95.6 - 46.0 (%)    MCV 90.2  78.0 - 100.0 (fL)    MCH 29.5  26.0 - 34.0 (pg)    MCHC 32.7  30.0 - 36.0 (g/dL)    RDW 21.3  08.6 - 57.8 (%)    Platelets 275  150 - 400 (K/uL)   COMPREHENSIVE METABOLIC PANEL     Status: Abnormal   Collection Time   05/21/11  6:17 AM      Component Value Range Comment   Sodium 140  135 - 145 (mEq/L)    Potassium 3.5  3.5 - 5.1 (mEq/L)    Chloride 100  96 - 112 (mEq/L)    CO2 29  19 - 32 (mEq/L)    Glucose, Bld 119 (*) 70 - 99 (mg/dL)    BUN 24 (*) 6 - 23 (mg/dL)    Creatinine, Ser 4.69  0.50 - 1.10 (mg/dL)    Calcium 9.1  8.4 - 10.5 (mg/dL)    Total Protein 6.3  6.0 - 8.3 (g/dL)    Albumin 3.2 (*) 3.5 - 5.2 (g/dL)    AST 20  0 - 37 (U/L)    ALT 16  0 - 35 (U/L)    Alkaline Phosphatase 74  39 - 117 (U/L)    Total Bilirubin 0.5  0.3 - 1.2 (mg/dL)    GFR calc non Af Amer 60 (*) >90 (mL/min)    GFR calc Af Amer 70 (*) >90 (mL/min)   DIFFERENTIAL     Status: Abnormal   Collection Time   05/21/11  6:17 AM      Component Value Range Comment   Neutrophils Relative 63  43 - 77 (%)    Neutro Abs 3.9  1.7 - 7.7 (K/uL)    Lymphocytes Relative 21  12 - 46 (%)    Lymphs Abs 1.3  0.7 - 4.0 (K/uL)    Monocytes Relative 9  3 - 12 (%)      Monocytes Absolute 0.6  0.1 - 1.0 (K/uL)    Eosinophils Relative 6 (*) 0 - 5 (%)    Eosinophils Absolute 0.4  0.0 - 0.7 (K/uL)    Basophils Relative 1  0 - 1 (%)    Basophils Absolute 0.0  0.0 - 0.1 (K/uL)   PROTIME-INR     Status: Abnormal   Collection Time   05/21/11  6:17 AM      Component Value Range Comment   Prothrombin Time 19.0 (*) 11.6 - 15.2 (seconds)    INR 1.56 (*) 0.00 - 1.49    PROTIME-INR     Status: Abnormal   Collection Time  05/22/11  6:25 AM      Component Value Range Comment   Prothrombin Time 20.4 (*) 11.6 - 15.2 (seconds)    INR 1.71 (*) 0.00 - 1.49       Physical Exam  Vitals reviewed.  Constitutional: She is oriented to person, place, and time. She appears well-developed.  HENT:  Head: Normocephalic. PERRL, EOMI  Neck: No thyromegaly present.  Cardiovascular:  Cardiac rate controlled, positive murmur. Reg rhythm.  Pulmonary/Chest: Clear to auscultation. No murmurs rubs or gallops  Abdominal: She exhibits no distension. There is no tenderness.  Musculoskeletal: She exhibits no edema. Tenderness at hamstrings Neurological: She is alert and oriented to person, place, and time.  Skin: Skin is warm and dry.  Psychiatric: She has a normal mood and affect. Cognition quite appropriate  motor strength is 5/5 in the right deltoid, biceps, triceps, grip, right hip flexor, knee extensor right ankle dorsiflexor and plantar flexor  On the left side she has 4+/5 in the left deltoid, biceps, triceps,grip 5. 3+ hip, 4/5 ADF/APF, 4/5 knee. Good sitting balance Sensation is intact to light touch in bilateral upper and bilateral lower extremities  Tone is normal in the right side and the left upper extremity. Tone is reduced in the left lower extremity  Cranial nerves show no evidence of facial droop no extraocular muscle weakness   Assessment/Plan: 1. Functional deficits secondary to R Frontal infarct with L hemiparesis which require 3+ hours per day of  interdisciplinary therapy in a comprehensive inpatient rehab setting. Physiatrist is providing close team supervision and 24 hour management of active medical problems listed below. Physiatrist and rehab team continue to assess barriers to discharge/monitor patient progress toward functional and medical goals. FIM: FIM - Bathing Bathing Steps Patient Completed: Chest;Right Arm;Left Arm;Abdomen;Front perineal area;Buttocks;Right lower leg (including foot);Left upper leg;Right upper leg;Left lower leg (including foot) Bathing: 6: More than reasonable amount of time  FIM - Upper Body Dressing/Undressing Upper body dressing/undressing steps patient completed: Thread/unthread right bra strap;Thread/unthread left bra strap;Thread/unthread right sleeve of pullover shirt/dresss;Thread/unthread left sleeve of pullover shirt/dress;Put head through opening of pull over shirt/dress;Pull shirt over trunk (sports bra) Upper body dressing/undressing: 7: Complete Independence: No helper FIM - Lower Body Dressing/Undressing Lower body dressing/undressing steps patient completed: Thread/unthread right underwear leg;Pull underwear up/down;Thread/unthread right pants leg;Thread/unthread left pants leg;Pull pants up/down;Thread/unthread left underwear leg;Fasten/unfasten pants;Don/Doff right sock;Don/Doff left sock;Don/Doff right shoe;Don/Doff left shoe;Fasten/unfasten right shoe;Fasten/unfasten left shoe Lower body dressing/undressing: 6: More than reasonable amount of time  FIM - Toileting Toileting steps completed by patient: Adjust clothing prior to toileting;Performs perineal hygiene;Adjust clothing after toileting Toileting: 5: Supervision: Safety issues/verbal cues  FIM - Archivist Transfers Assistive Devices: Art gallery manager Transfers: 5-To toilet/BSC: Supervision (verbal cues/safety issues);5-From toilet/BSC: Supervision (verbal cues/safety issues)  FIM - Landscape architect Devices: Therapist, occupational: 6: Supine > Sit: No assist;6: Sit > Supine: No assist;5: Bed > Chair or W/C: Supervision (verbal cues/safety issues);5: Chair or W/C > Bed: Supervision (verbal cues/safety issues)  FIM - Locomotion: Wheelchair Locomotion: Wheelchair: 0: Activity did not occur FIM - Locomotion: Ambulation Locomotion: Ambulation Assistive Devices: Designer, industrial/product Ambulation/Gait Assistance: 5: Supervision Locomotion: Ambulation: 5: Travels 150 ft or more with supervision/safety issues  Comprehension Comprehension Mode: Auditory Comprehension: 7-Follows complex conversation/direction: With no assist  Expression Expression Mode: Verbal Expression: 7-Expresses complex ideas: With no assist  Social Interaction Social Interaction: 7-Interacts appropriately with others - No medications needed.  Problem Solving Problem Solving: 6-Solves complex problems:  With extra time  Memory Memory: 6-Assistive device: No helper   Medical Problem List and Plan:  1. acute nonhemorrhagic mid to posterior right frontal lobe infarction -home Monday? 2. DVT Prophylaxis/Anticoagulation: Chronic Coumadin therapy.pharmacy dosing  Monitor for any signs of bleeding  3. Hypertension/atrial fibrillation. Demadex 20 mg daily, verapamil 180 mg each bedtime and Lanoxin 0.25 mg daily. Monitor with increased mobility. Heart rate is controlled.  4. Tachybradycardia syndrome. History of pacemaker 2006. HR controlled at present  5. History of GI bleed. Recent maroon-colored stools. EGD without active bleeding. Protonic since initiated. Follow serial cbc's. Recent hgb 11.4  6. Epistaxis. Occurrence x1 on 05/14/2011. No further bleeding-follow clinically 7.  L hamstring strain sports cream- improving LOS (Days) 3 A FACE TO FACE EVALUATION WAS PERFORMED  Margherita Collyer T 05/23/2011, 7:03 AM

## 2011-05-23 NOTE — Progress Notes (Signed)
Physical Therapy Note  Patient Details  Name: [name] MRN: [id number] Date of Birth: [birth year] Today's Date: 05/23/2011  3244-0102 (40 minutes) individual Pain: no complaint of pain Focus of treatment: Bilateral LE strengthening in weight bearing [name] home exercise program Treatment: Pt completed 15 reps of written, illustrated home exercise program ( hip flexion, extension, knee flexion, ankle raises, hip abduction bilaterally) using chair for support with min instructional cues for correct technique./Gait 150 feet on unit SBA with RW.   Elyssa Pendelton,JIM 05/23/2011, 4:34 PM

## 2011-05-24 LAB — PROTIME-INR
INR: 1.76 — ABNORMAL HIGH (ref 0.00–1.49)
Prothrombin Time: 20.8 seconds — ABNORMAL HIGH (ref 11.6–15.2)

## 2011-05-24 MED ORDER — TORSEMIDE 20 MG PO TABS
20.0000 mg | ORAL_TABLET | Freq: Once | ORAL | Status: AC
  Administered 2011-05-24: 20 mg via ORAL

## 2011-05-24 MED ORDER — WARFARIN SODIUM 6 MG PO TABS
6.0000 mg | ORAL_TABLET | Freq: Once | ORAL | Status: AC
  Administered 2011-05-24: 6 mg via ORAL
  Filled 2011-05-24: qty 1

## 2011-05-24 MED ORDER — WHITE PETROLATUM GEL
Status: AC
  Administered 2011-05-24: 21:00:00
  Filled 2011-05-24: qty 5

## 2011-05-24 NOTE — Progress Notes (Addendum)
Subjective/Complaints: Legs swollen Can IV come out + constipation RUQ pain with deep breath Objective: Vital Signs: Blood pressure 126/74, pulse 81, temperature 98.4 F (36.9 C), temperature source Oral, resp. rate 18, SpO2 95.00%. No results found. Results for orders placed during the hospital encounter of 05/20/11 (from the past 72 hour(s))  PROTIME-INR     Status: Abnormal   Collection Time   05/22/11  6:25 AM      Component Value Range Comment   Prothrombin Time 20.4 (*) 11.6 - 15.2 (seconds)    INR 1.71 (*) 0.00 - 1.49    PROTIME-INR     Status: Abnormal   Collection Time   05/23/11  5:00 AM      Component Value Range Comment   Prothrombin Time 20.9 (*) 11.6 - 15.2 (seconds)    INR 1.77 (*) 0.00 - 1.49    PROTIME-INR     Status: Abnormal   Collection Time   05/24/11  5:54 AM      Component Value Range Comment   Prothrombin Time 20.8 (*) 11.6 - 15.2 (seconds)    INR 1.76 (*) 0.00 - 1.49       Physical Exam  Vitals reviewed.  Constitutional: She is oriented to person, place, and time. She appears well-developed.  HENT:  Head: Normocephalic. PERRL, EOMI  Neck: No thyromegaly present.  Cardiovascular:  Cardiac rate controlled, positive murmur. Reg rhythm.  Pulmonary/Chest: Clear to auscultation. No murmurs rubs or gallops  Abdominal: She exhibits no distension. There is no tenderness.  Musculoskeletal: She exhibits 1++ edema bilateral LE. Tenderness at hamstrings Neurological: She is alert and oriented to person, place, and time.  Skin: Skin is warm and dry.  Psychiatric: She has a normal mood and affect. Cognition quite appropriate  motor strength is 5/5 in the right deltoid, biceps, triceps, grip, right hip flexor, knee extensor right ankle dorsiflexor and plantar flexor  On the left side she has 4+/5 in the left deltoid, biceps, triceps,grip 5. 3+ hip, 4/5 ADF/APF, 4/5 knee. Good sitting balance Sensation is intact to light touch in bilateral upper and bilateral lower  extremities  Tone is normal in the right side and the left upper extremity. Tone is reduced in the left lower extremity  Cranial nerves show no evidence of facial droop no extraocular muscle weakness   Assessment/Plan: 1. Functional deficits secondary to R Frontal infarct with L hemiparesis which require 3+ hours per day of interdisciplinary therapy in a comprehensive inpatient rehab setting. Physiatrist is providing close team supervision and 24 hour management of active medical problems listed below. Physiatrist and rehab team continue to assess barriers to discharge/monitor patient progress toward functional and medical goals. FIM: FIM - Bathing Bathing Steps Patient Completed: Chest;Right Arm;Left Arm;Abdomen;Front perineal area;Buttocks;Right upper leg;Left upper leg;Right lower leg (including foot);Left lower leg (including foot) Bathing: 6: More than reasonable amount of time (prefers to complete b/d on her own)  FIM - Upper Body Dressing/Undressing Upper body dressing/undressing steps patient completed: Thread/unthread right sleeve of pullover shirt/dresss;Thread/unthread left sleeve of pullover shirt/dress;Put head through opening of pull over shirt/dress;Pull shirt over trunk Upper body dressing/undressing: 6: More than reasonable amount of time FIM - Lower Body Dressing/Undressing Lower body dressing/undressing steps patient completed: Thread/unthread right underwear leg;Thread/unthread left underwear leg;Pull underwear up/down;Thread/unthread right pants leg;Thread/unthread left pants leg;Pull pants up/down;Fasten/unfasten pants;Don/Doff right sock;Don/Doff left sock Lower body dressing/undressing: 6: More than reasonable amount of time  FIM - Toileting Toileting steps completed by patient: Adjust clothing prior to toileting;Performs perineal  hygiene;Adjust clothing after toileting Toileting: 6: Assistive device: No helper  FIM - Diplomatic Services operational officer  Devices: Art gallery manager Transfers: 6-Assistive device: No helper  FIM - Banker Devices: Therapist, occupational: 7: Independent: No helper  FIM - Locomotion: Wheelchair Locomotion: Wheelchair: 0: Activity did not occur FIM - Locomotion: Ambulation Locomotion: Ambulation Assistive Devices: Designer, industrial/product Ambulation/Gait Assistance: 5: Supervision Locomotion: Ambulation: 5: Travels 150 ft or more with supervision/safety issues  Comprehension Comprehension Mode: Auditory Comprehension: 7-Follows complex conversation/direction: With no assist  Expression Expression Mode: Verbal Expression: 7-Expresses complex ideas: With no assist  Social Interaction Social Interaction: 7-Interacts appropriately with others - No medications needed.  Problem Solving Problem Solving: 7-Solves complex problems: Recognizes & self-corrects  Memory Memory: 7-Complete Independence: No helper   Medical Problem List and Plan:  1. acute nonhemorrhagic mid to posterior right frontal lobe infarction -home Monday? 2. DVT Prophylaxis/Anticoagulation: Chronic Coumadin therapy.pharmacy dosing  Monitor for any signs of bleeding  3. Hypertension/atrial fibrillation. Demadex 20 mg daily, verapamil 180 mg each bedtime and Lanoxin 0.25 mg daily. Monitor with increased mobility. Heart rate is controlled.   -increase demadex to 40 qday today  `-check labs in am  -elevate legs. KHT 4. Tachybradycardia syndrome. History of pacemaker 2006. HR controlled at present  5. History of GI bleed. Recent maroon-colored stools. EGD without active bleeding. Protonic since initiated. Follow serial cbc's. Recent hgb 11.4  6. Epistaxis. Occurrence x1 on 05/14/2011. No further bleeding-follow clinically 7.  L hamstring strain sports cream- improving LOS (Days) 4 A FACE TO FACE EVALUATION WAS PERFORMED  [name] T 05/24/2011, 8:27 AM

## 2011-05-24 NOTE — Progress Notes (Signed)
BLE's with pitting edema. Per previous report, patient had sorbitol. [name]

## 2011-05-24 NOTE — Progress Notes (Signed)
ANTICOAGULATION CONSULT NOTE - Follow Up Consult  Pharmacy Consult:  Coumadin Indication: atrial fibrillation  No Known Allergies  Patient Measurements: Weight = 74.4 kg  Vital Signs: Temp: 98.4 F (36.9 C) (04/28 0630) Temp src: Oral (04/28 0630) BP: 126/74 mmHg (04/28 0630) Pulse Rate: 81  (04/28 0630)  Labs:  Alvira Philips 05/24/11 0554 05/23/11 0500 05/22/11 0625  HGB -- -- --  HCT -- -- --  PLT -- -- --  APTT -- -- --  LABPROT 20.8* 20.9* 20.4*  INR 1.76* 1.77* 1.71*  HEPARINUNFRC -- -- --  CREATININE -- -- --  CKTOTAL -- -- --  CKMB -- -- --  TROPONINI -- -- --   The CrCl is unknown because both a height and weight (above a minimum accepted value) are required for this calculation.    Assessment: 78 yr old female admitted with nonhemorrhagic CVA.  Pt has had some maroon stools but scope showed no bleeding, so okay to resume Coumadin per GI.  INR < goal d/t Coumadin being held x 2 days. INR today 1.76 No further bleeding reported.  Was on Coumadin 5mg  PO daily except 2.5mg  on Mon and Fri PTA.   Goal of Therapy:  INR 2-2.5    Plan:  - Coumadin 6mg  PO today - Daily PT/INR - Monitor for s/sx of bleeding  Talbert Cage, [name] Pager:  [phone]       05/24/2011, 9:11 AM

## 2011-05-24 NOTE — Progress Notes (Signed)
Physical Therapy Note  Patient Details  Name: LOUISE RAWSON MRN: [id number] Date of Birth: Nov 25, 1923 Today's Date: 05/24/2011  1100-1155 (55 minutes) group Pain: no complaint of pain Pt participated in PT group focusing on gait training/safety/endurance. Pt ambulates to/from gym 150 feet SBA RW; dynamic gait throwing ball to challenge balance and resistive ambulation without AD    Triston Lisanti,JIM 05/24/2011, 12:35 PM

## 2011-05-25 ENCOUNTER — Inpatient Hospital Stay (HOSPITAL_COMMUNITY): Payer: Medicare Other

## 2011-05-25 LAB — BASIC METABOLIC PANEL
BUN: 20 mg/dL (ref 6–23)
CO2: 32 mEq/L (ref 19–32)
Chloride: 101 mEq/L (ref 96–112)
Glucose, Bld: 124 mg/dL — ABNORMAL HIGH (ref 70–99)
Potassium: 3.7 mEq/L (ref 3.5–5.1)
Sodium: 142 mEq/L (ref 135–145)

## 2011-05-25 MED ORDER — PANTOPRAZOLE SODIUM 40 MG PO TBEC: 40.0000 mg | DELAYED_RELEASE_TABLET | Freq: Every day | ORAL | Status: DC

## 2011-05-25 MED ORDER — WARFARIN SODIUM 7.5 MG PO TABS
7.5000 mg | ORAL_TABLET | Freq: Once | ORAL | Status: DC
  Filled 2011-05-25: qty 1

## 2011-05-25 NOTE — Progress Notes (Signed)
Social Work Discharge Note Discharge Note  The overall goal for the admission was met for:   Discharge location: Yes-[facility] FOR FEW WEEKS  Length of Stay: Yes-5 DAYS  Discharge activity level: Yes-SUPERVISION/MOD/I LEVEL  Home/community participation: Yes  Services provided included: MD, RD, PT, OT, RN, CM, TR, Pharmacy and SW  Financial Services: Medicare and Private Insurance: BCBS  Follow-up services arranged: Home Health: [facility],RN and DME: [facility]  Comments (or additional information):PT AGREEABLE TO GO TO ALF UNTIL HOUSE CAN BE CLEANED OUT.  Patient/Family verbalized understanding of follow-up arrangements: Yes  Individual responsible for coordination of the follow-up plan: DONNA-DAUGHTER  Confirmed correct DME delivered: Lucy Chris 05/25/2011    Lucy Chris

## 2011-05-25 NOTE — Discharge Summary (Signed)
[name], [name]             ACCOUNT NO.:  [id number]  MEDICAL RECORD NO.:  [id number]  LOCATION:  4005                         FACILITY:  [facility]  PHYSICIAN:  [name], M.D.DATE OF BIRTH:  04-04-1923  DATE OF ADMISSION:  05/20/2011 DATE OF DISCHARGE:  05/25/2011                              DISCHARGE SUMMARY   DISCHARGE DIAGNOSES: 1. 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 2. Chronic Coumadin therapy. 3. Hypertension. 4. Atrial fibrillation. 5. Tachycardia with pacemaker. 6. History of gastrointestinal bleed. 7. Epistaxis - resolved.  This is an 78 year old right-handed female with history of atrial fibrillation, pacemaker, chronic Coumadin therapy who was admitted on May 16, 2011, with left-sided weakness.  Cranial CT scan showed small acute nonhemorrhagic mid to posterior right frontal lobe infarction. MRI could not be completed due to pacemaker.  Carotid Dopplers with no ICA stenosis.  Echocardiogram with ejection fraction of 70% without emboli.  Neurology consulted and remained on Coumadin therapy with INR 1.71.  Gastroenterology was consulted on May 19, 2011, for maroon stool x4 without associated abdominal pain or nausea.  There was also reported nosebleed on May 14, 2011, that resolved on its own without recurrence.  Underwent an endoscopy on May 19, 2011, showing small benign-appearing antral ulcerations without any bleeding.  No blood products or rectal bleeding seen.  Suspect gastric findings unrelated to her melena.  Felt recent epistaxis could be the source of recent melena. Placed on Protonix therapy.  He was advised to continue Coumadin.  She was admitted for comprehensive rehab program.  PAST MEDICAL HISTORY:  See discharge diagnoses.  SOCIAL HISTORY:  Lives alone.  One-level home, 2 steps to entry.  She has a daughter and son-in-law with who can assist.  FUNCTIONAL HISTORY:  Prior to admission was  independent, driving, and retired.  Functional status upon admission to rehab services was minimal assist for sit to supine, minimum to moderate assist to ambulate 120 feet with a rolling walker.  PHYSICAL EXAMINATION:  VITAL SIGNS:  Blood pressure 125/67, pulse 65, respirations 18, temperature 98.1. GENERAL:  This is an alert female, in no acute distress, oriented x3. CARDIAC:  Rate controlled. ABDOMEN:  Soft, nontender.  Good bowel sounds. NEUROLOGIC:  She can move all extremities.  On the left side, she was 4+ out of 5 in the left deltoid, biceps, triceps, grip, 3+ at the hips, 4-5 at the ankle dorsiflexion and plantar flexion.  REHABILITATION HOSPITAL COURSE:  The patient was admitted to inpatient rehab services with therapies initiated on a 3-hour daily basis consisting of physical therapy, occupational therapy, and rehabilitation nursing.  The following issues were addressed during the patient's rehabilitation stay.  Pertaining to [name] acute nonhemorrhagic mid to posterior right frontal lobe infarction remained stable, maintained on Coumadin therapy with latest INR of 1.76 and no bleeding episodes.  She would follow up with [facility] in relation to her atrial fibrillation as well as chronic Coumadin therapy. Her blood pressure, heart rate remained well controlled on digoxin, torsemide, and verapamil.  She did have a history of pacemaker, she had no chest pain or shortness of breath.  She had been seen by Gastroenterology  Services, Dr. Bosie Clos, for recent maroon-colored stools.  No further bleeding identified.  EGD without active bleeding. Hemoglobin remained stable at 11.4, hematocrit 34.9.  The patient received weekly collaborative interdisciplinary team conferences to discuss estimated length of stay, family teaching, and any barriers to her discharge.  She was progressing in all areas, ambulating with a rolling walker.  The patient participated with  a group focusing on ambulation safety, endurance which had greatly improved, ambulating 150 feet.  Plan is to be discharged to assisted living for approximately the next 1 week until her house could be pulled together for discharge to home and then supervision by family could be provided as needed.  DISCHARGE MEDICATIONS:  At the time of dictation included: 1. Lanoxin 0.125 mg daily. 2. Protonix 40 mg p.o. daily. 3. MiraLAX 17 g daily with 8 ounces of water. 4. Demadex 20 mg p.o. daily. 5. Verapamil 180 mg p.o. bedtime. 6. Coumadin, 5 mg 5 days a week and 2.5 mg Monday and Friday      7. Desyrel 50 mg at bedtime as needed FOLLOWUPS:  Would be Dr. Claudette Laws on Jun 12, 2011; Dr. Earl Lites, medical management; Dr. Nanetta Batty, call for appointment; Dr. Delia Heady in 1 month.  SPECIAL INSTRUCTIONS:  Home health nurse will check INR on Monday, on May 27, 2011, results to [facility], [phone], fax [phone].  Her diet was regular.     Mariam Dollar, P.A.   ______________________________ [name], M.D.    DA/MEDQ  D:  05/25/2011  T:  05/25/2011  Job:  [id number]  cc:   Brett Canales A. Cleta Alberts, M.D. Nanetta Batty, M.D. Pramod P. Pearlean Brownie, MD

## 2011-05-25 NOTE — Progress Notes (Signed)
Increased pitting edema to BLE's. "I've never saw my legs this big before". Removed teds at bedtime and reminded patient to keep legs elevated.Tawanna Solo

## 2011-05-25 NOTE — Progress Notes (Signed)
Late Entry: Pt discharged home with family.Discharge instructions provided by Harvel Ricks, PA. Pt verbalized understanding of information provided. Pt escorted off unit in w/c with personal belonging by Lelon Mast, NT.

## 2011-05-25 NOTE — Progress Notes (Signed)
Patient ID: [name], female   DOB: [birth year], 78 y.o.   MRN: [id number] Subjective/Complaints: Legs swollen, chronic problem.  I don't have my good stockings here  Objective: Vital Signs: Blood pressure 125/67, pulse 65, temperature 98.3 F (36.8 C), temperature source Oral, resp. rate 18, SpO2 93.00%. No results found. Results for orders placed during the hospital encounter of 05/20/11 (from the past 72 hour(s))  PROTIME-INR     Status: Abnormal   Collection Time   05/23/11  5:00 AM      Component Value Range Comment   Prothrombin Time 20.9 (*) 11.6 - 15.2 (seconds)    INR 1.77 (*) 0.00 - 1.49    PROTIME-INR     Status: Abnormal   Collection Time   05/24/11  5:54 AM      Component Value Range Comment   Prothrombin Time 20.8 (*) 11.6 - 15.2 (seconds)    INR 1.76 (*) 0.00 - 1.49       Physical Exam  Vitals reviewed.  Constitutional: She is oriented to person, place, and time. She appears well-developed.  HENT:  Head: Normocephalic. PERRL, EOMI  Neck: No thyromegaly present.  Cardiovascular:  Cardiac rate controlled, positive murmur. Reg rhythm.  Pulmonary/Chest: Clear to auscultation. No murmurs rubs or gallops  Abdominal: She exhibits no distension. There is no tenderness.  Musculoskeletal: She exhibits 1++ edema bilateral LE. Tenderness at hamstrings Neurological: She is alert and oriented to person, place, and time.  Skin: Skin is warm and dry.  Psychiatric: She has a normal mood and affect. Cognition quite appropriate  motor strength is 5/5 in the right deltoid, biceps, triceps, grip, right hip flexor, knee extensor right ankle dorsiflexor and plantar flexor  On the left side she has 4+/5 in the left deltoid, biceps, triceps,grip 5. 3+ hip, 4/5 ADF/APF, 4/5 knee. Good sitting balance Sensation is intact to light touch in bilateral upper and bilateral lower extremities  Tone is normal in the right side and the left upper extremity. Tone is reduced in the left lower  extremity  Cranial nerves show no evidence of facial droop no extraocular muscle weakness   Assessment/Plan: 1. Functional deficits secondary to R Frontal infarct with L hemiparesis which require 3+ hours per day of interdisciplinary therapy in a comprehensive inpatient rehab setting. Physiatrist is providing close team supervision and 24 hour management of active medical problems listed below. Physiatrist and rehab team continue to assess barriers to discharge/monitor patient progress toward functional and medical goals. FIM: FIM - Bathing Bathing Steps Patient Completed: Chest;Right Arm;Left Arm;Abdomen;Front perineal area;Buttocks;Right upper leg;Left upper leg;Right lower leg (including foot);Left lower leg (including foot) Bathing: 6: More than reasonable amount of time (prefers to complete b/d on her own)  FIM - Upper Body Dressing/Undressing Upper body dressing/undressing steps patient completed: Thread/unthread right sleeve of pullover shirt/dresss;Thread/unthread left sleeve of pullover shirt/dress;Put head through opening of pull over shirt/dress;Pull shirt over trunk Upper body dressing/undressing: 6: More than reasonable amount of time FIM - Lower Body Dressing/Undressing Lower body dressing/undressing steps patient completed: Thread/unthread right underwear leg;Thread/unthread left underwear leg;Pull underwear up/down;Thread/unthread right pants leg;Thread/unthread left pants leg;Pull pants up/down;Fasten/unfasten pants;Don/Doff right sock;Don/Doff left sock Lower body dressing/undressing: 6: More than reasonable amount of time  FIM - Toileting Toileting steps completed by patient: Adjust clothing prior to toileting;Performs perineal hygiene;Adjust clothing after toileting Toileting: 7: Independent: No helper, no device  FIM - Diplomatic Services operational officer Devices: Art gallery manager Transfers: 6-Assistive device: No helper  FIM - Psychologist, occupational  Bed/Chair  Transfer Assistive Devices: Therapist, occupational: 7: Independent: No helper  FIM - Locomotion: Wheelchair Locomotion: Wheelchair: 0: Activity did not occur FIM - Locomotion: Ambulation Locomotion: Ambulation Assistive Devices: Designer, industrial/product Ambulation/Gait Assistance: 5: Supervision Locomotion: Ambulation: 2: Travels 50 - 149 ft with supervision/safety issues  Comprehension Comprehension Mode: Auditory Comprehension: 7-Follows complex conversation/direction: With no assist  Expression Expression Mode: Verbal Expression: 7-Expresses complex ideas: With no assist  Social Interaction Social Interaction: 7-Interacts appropriately with others - No medications needed.  Problem Solving Problem Solving: 7-Solves complex problems: Recognizes & self-corrects  Memory Memory: 7-Complete Independence: No helper   Medical Problem List and Plan:  1. acute nonhemorrhagic mid to posterior right frontal lobe infarction -home Monday? 2. DVT Prophylaxis/Anticoagulation: Chronic Coumadin therapy.pharmacy dosing  Monitor for any signs of bleeding  3. Hypertension/atrial fibrillation. Demadex 20 mg daily, verapamil 180 mg each bedtime and Lanoxin 0.25 mg daily. Monitor with increased mobility. Heart rate is controlled.   -increase demadex to 40 qday today  `-check labs in am  -elevate legs. KHT 4. Tachybradycardia syndrome. History of pacemaker 2006. HR controlled at present  5. History of GI bleed. Recent maroon-colored stools. EGD without active bleeding. Protonic since initiated. Follow serial cbc's. Recent hgb 11.4 Recheck today prior to D/C 6. Epistaxis. Occurrence x1 on 05/14/2011. No further bleeding-follow clinically 7.  L hamstring strain sports cream- improving 8.  Venous stasis, compression hose elevation and demadex LOS (Days) 5 A FACE TO FACE EVALUATION WAS PERFORMED  Arad Burston E 05/25/2011, 6:53 AM

## 2011-05-25 NOTE — Progress Notes (Signed)
ANTICOAGULATION CONSULT NOTE - Follow Up Consult  Pharmacy Consult:  Coumadin Indication: atrial fibrillation  No Known Allergies  Patient Measurements: Weight = 74.4 kg  Vital Signs: Temp: 98.3 F (36.8 C) (04/29 0550) Temp src: Oral (04/29 0550) BP: 125/67 mmHg (04/29 0550) Pulse Rate: 88  (04/29 0808)  Labs:  [name] 05/25/11 0627 05/24/11 0554 05/23/11 0500  HGB -- -- --  HCT -- -- --  PLT -- -- --  APTT -- -- --  LABPROT 20.5* 20.8* 20.9*  INR 1.72* 1.76* 1.77*  HEPARINUNFRC -- -- --  CREATININE 0.92 -- --  CKTOTAL -- -- --  CKMB -- -- --  TROPONINI -- -- --   The CrCl is unknown because both a height and weight (above a minimum accepted value) are required for this calculation.    Assessment: 78 yr old female admitted with nonhemorrhagic CVA.  Pt has had some maroon stools but scope showed no bleeding, so okay to resume Coumadin per GI.  INR < goal d/t Coumadin being held x 2 days. INR today 1.72 No further bleeding reported.  Was on Coumadin 5mg  PO daily except 2.5mg  on Mon and Fri PTA.   Goal of Therapy:  INR 2-2.5    Plan:  - Coumadin 7.5mg  PO today - Daily PT/INR - Monitor for s/sx of bleeding  Talbert Cage, [name] Pager:  [phone]       05/25/2011, 8:59 AM

## 2011-05-25 NOTE — Discharge Summary (Signed)
  Discharge summary job [id number]

## 2011-05-25 NOTE — Discharge Instructions (Signed)
Inpatient Rehab Discharge Instructions  [name] Discharge date and time: No discharge date for patient encounter.   Activities/Precautions/ Functional Status: Activity: activity as tolerated Diet: regular diet Wound Care: none needed Functional status:  ___ No restrictions     ___ Walk up steps independently _x__ 24/7 supervision/assistance   ___ Walk up steps with assistance ___ Intermittent supervision/assistance  ___ Bathe/dress independently ___ Walk with walker     ___ Bathe/dress with assistance ___ Walk Independently    ___ Shower independently __x_ Walk with assistance    ___ Shower with assistance ___ No alcohol     ___ Return to work/school ________  Special Instructions:  Home health nurse to check INR Wednesday, 05/27/2011 results to [facility] [phone] fax [phone]   COMMUNITY REFERRALS UPON DISCHARGE:    Home Health:   PT, RN   Agency: [facility] Phone: [phone] Date of last service:05/25/2011   Medical Equipment/Items Ordered:ROLLING WALKER  Agency/Supplier:[facility]  [phone] Other; [facility] FOR [facility]ORT TIME  GENERAL COMMUNITY RESOURCES FOR PATIENT/FAMILY: Support Groups:[facility] SUPPORT GROUP   My questions have been answered and I understand these instructions. I will adhere to these goals and the provided educational materials after my discharge from the hospital.  Patient/Caregiver Signature _______________________________ Date __________  Clinician Signature _______________________________________ Date __________  Please bring this form and your medication list with you to all your follow-up doctor's appointments.

## 2011-05-25 NOTE — Patient Care Conference (Signed)
Inpatient RehabilitationTeam Conference Note Date: 05/25/2011   Time: 11:00 AM    Patient Name: [name]      Medical Record Number: [id number]  Date of Birth: 18-Nov-1923 Sex: Female         Room/Bed: 4005/4005-01 Payor Info: Payor: MEDICARE  Plan: MEDICARE PART A AND B  Product Type: *No Product type*     Admitting Diagnosis: rt cva  Admit Date/Time:  05/20/2011  2:57 PM Admission Comments: No comment available   Primary Diagnosis:  CVA (cerebral infarction) Principal Problem: CVA (cerebral infarction)  Patient Active Problem List  Diagnoses Date Noted  . CVA (cerebral infarction) 05/20/2011  . GI bleed 05/19/2011  . Weakness of left side of body 05/16/2011  . Elevated digoxin level 05/16/2011  . Pacemaker 05/21/2010  . Edema 04/24/2010  . Pericardial effusion   . Chronic anticoagulation   . HTN (hypertension)   . Atrial fibrillation 04/14/2010    Expected Discharge Date: Expected Discharge Date: 05/25/11  Team Members Present: Physician: Dr. Claudette Laws Case Manager Present: Lutricia Horsfall, RN Social Worker Present: Dossie Der, LCSW Nurse Present: Daryll Brod, RN PT Present: Edman Circle, PT OT Present: Primitivo Gauze, OT     Current Status/Progress Goal Weekly Team Focus  Medical   No medical issues  manitain medical stability   transition to outpt environment   Bowel/Bladder   Continent of bowel and bladder  Continent of bowel and bladder  Monitor   Swallow/Nutrition/ Hydration             ADL's     mod I   mod I     Mobility   Goals met  mod I overall  balance, gait, D/C planning   Communication             Safety/Cognition/ Behavioral Observations            Pain   No c/o pain  <3  Monitor for increase repor of pain   Skin   Old bruising to BUE  No skin breakdown  Routine turn q 2hrs      *See Interdisciplinary Assessment and Plan and progress notes for long and short-term goals  Barriers to Discharge: none      Possible Resolutions to Barriers:  none    Discharge Planning/Teaching Needs:  Go to [facility] until house can be cleaned out of clutter, daughter's working on this.  High level short length of stay      Team Discussion:  Pt has made good progress and team in agreement that pt ready for d/c today.  Revisions to Treatment Plan:     Continued Need for Acute Rehabilitation Level of Care: The patient requires daily medical management by a physician with specialized training in physical medicine and rehabilitation for the following conditions: Daily direction of a multidisciplinary physical rehabilitation program to ensure safe treatment while eliciting the highest outcome that is of practical value to the patient.: Yes Daily medical management of patient stability for increased activity during participation in an intensive rehabilitation regime.: Yes Daily analysis of laboratory values and/or radiology reports with any subsequent need for medication adjustment of medical intervention for : Neurological problems  Meryl Dare 05/26/2011, 11:03 AM

## 2011-05-25 NOTE — Progress Notes (Signed)
Physical Therapy Discharge Summary  Patient Details  Name: [name] MRN: [id number] Date of Birth: [birth year]  Today's Date: 05/25/2011 Time: 0900-1000 Time Calculation (min): 60 min  Patient has made excellent progress and has met 9 of 9 long term goals due to improved balance, increased strength, functional use of  left lower extremity and improved coordination.  Patient to discharge at an ambulatory level Modified Independent with RW for community mobility secondary to continued increased falls risk as indicated by Sharlene Motts balance score of 50/56 and DGI score of 16/24 .   Patient lives alone and secondary to continued LE weakness and impaired balance patient will D/C to ALF for 1 week prior to returning home alone with intermittent supervision and assistance of her daughter.  No formal education given to patient's daughter; patient at mod I level and able to demonstrate and verbalize all transfer sequences independently.  Reasons goals not met: All Goals Met  Recommendation:  Patient will benefit from ongoing skilled PT services in home health setting to continue to advance safe functional mobility, address ongoing impairments in L sided weakness, impaired standing dynamic balance, balance strategies, gait, return to previous level of function and minimize fall risk.  Equipment: RW  Reasons for discharge: treatment goals met and discharge from hospital  Patient/family agrees with progress made and goals achieved: Yes  PT Discharge Precautions/Restrictions Precautions Precautions: ICD/Pacemaker Pain Pain Assessment Pain Assessment: No/denies pain Vision/Perception  Vision - History Baseline Vision: No visual deficits Vision - Assessment Eye Alignment: Within Functional Limits  Cognition Overall Cognitive Status: Appears within functional limits for tasks assessed Orientation Level: Oriented X4 Sensation Sensation Light Touch: Impaired Detail Light Touch Impaired  Details: Impaired RLE;Impaired LLE secondary to h/o neuropathy Proprioception: Appears Intact Coordination Gross Motor Movements are Fluid and Coordinated: No Heel Shin Test: Slight dysmetria secondary to weakness Motor  Motor Motor: Hemiplegia Motor - Discharge Observations: L sided weakness  Mobility Bed Mobility Sit to Supine: 7: Independent Transfers Stand Pivot Transfers: 6: Modified independent (Device/Increase time) Stand Pivot Transfer Details (indicate cue type and reason): Mod I with UE support on RW or furniture; performed floor > mat transfer mod I without use of furniture to push up, no cues needed; did discuss again with patient her plan to contact emergency help if she experiences signs or symptoms of CVA or fx.  Patient states she will find a way to keep her cell phone on her or may purchase Life Alert system.  Also practiced passenger side car transfer with stand pivot mod I with UE support on car hand rail putting L foot in first and then sitting in seat. Locomotion  Ambulation Ambulation/Gait Assistance: 6: Modified independent (Device/Increase time) Ambulation Distance (Feet): 300 Feet Assistive device: Rolling walker Ambulation/Gait Assistance Details: RW during longer distances or higher level gait challenges; able to ambulate household environment or level surfaces without RW mod I High Level Ambulation High Level Ambulation: Backwards walking;Direction changes;Sudden stops;Head turns;Other high level ambulation Backwards Walking: mod I; slower speed without AD Direction Changes: mod I; slower speed without AD Sudden Stops: mod I; slower speed without AD Head Turns: mod I; slower speed without AD High Level Ambulation - Other Comments: Walking over compliant surfaces, up and down curb and ramp, picking up items off the floor and gait during dual tasks of carrying items in hand and cognitive activity all mod I; slower speed without AD Stairs / Additional  Locomotion Stairs Assistance: 6: Modified independent (Device/Increase time) Stair Management Technique: One  rail Left;Alternating pattern;Forwards Number of Stairs: 12  Height of Stairs: 8  Wheelchair Mobility Wheelchair Mobility: No   Balance Standardized Balance Assessment Standardized Balance Assessment: Berg Balance Test;Dynamic Gait Index Berg Balance Test Sit to Stand: Able to stand without using hands and stabilize independently Standing Unsupported: Able to stand safely 2 minutes Sitting with Back Unsupported but Feet Supported on Floor or Stool: Able to sit safely and securely 2 minutes Stand to Sit: Sits safely with minimal use of hands Transfers: Able to transfer safely, minor use of hands Standing Unsupported with Eyes Closed: Able to stand 10 seconds with supervision Standing Ubsupported with Feet Together: Able to place feet together independently and stand 1 minute safely From Standing, Reach Forward with Outstretched Arm: Can reach confidently >25 cm (10") From Standing Position, Pick up Object from Floor: Able to pick up shoe safely and easily From Standing Position, Turn to Look Behind Over each Shoulder: Looks behind from both sides and weight shifts well Turn 360 Degrees: Able to turn 360 degrees safely in 4 seconds or less Standing Unsupported, Alternately Place Feet on Step/Stool: Able to stand independently and complete 8 steps >20 seconds Standing Unsupported, One Foot in Front: Able to take small step independently and hold 30 seconds Standing on One Leg: Able to lift leg independently and hold equal to or more than 3 seconds Total Score: 50 Patient demonstrates moderate fall risk as noted by score of  50/56 on Berg Balance Scale.  (<36= high risk for falls, close to 100%; 37-45 significant >80%; 46-51 moderate >50%; 52-55 lower >25%)  Dynamic Gait Index Level Surface: Mild Impairment Change in Gait Speed: Mild Impairment Gait with Horizontal Head Turns: Mild  Impairment Gait with Vertical Head Turns: Mild Impairment Gait and Pivot Turn: Mild Impairment Step Over Obstacle: Mild Impairment Step Around Obstacles: Mild Impairment Steps: Mild Impairment Total Score: 16; still at risk for falls during community mobility (score of <19/24); recommending use of RW during community mobility to decrease risk for falls  Dynamic Standing Balance Dynamic Standing - Balance Support: No upper extremity supported Dynamic Standing - Level of Assistance: 6: Modified independent (Device/Increase time) Extremity Assessment  RLE Assessment RLE Assessment: Within Functional Limits LLE Assessment LLE Assessment: Exceptions to [facility] LLE Strength LLE Overall Strength: Deficits Left Hip Flexion: 4/5 Left Hip Extension: 4/5 Left Knee Flexion: 4/5 Left Knee Extension: 5/5 Left Ankle Dorsiflexion: 5/5  See FIM for current functional status  [address] [facility] 05/25/2011, 9:54 AM

## 2011-05-25 NOTE — Progress Notes (Signed)
Occupational Therapy Skilled Therapy Intervention and Discharge Summary  Patient Details  Name: [name] MRN: [id number] Date of Birth: 1923/08/14  Today's Date: 05/25/2011 Visit 1: Time: 0700-0800 Time Calculation: 60 minutes Visit 2: Time: 4098-1191 Time Calculation (min): 23  Min  Skilled Therapy Intervention: Visit 1:    Pt seen for BADL retraining of toileting, bathing, and dressing with a focus on pt completing all tasks at a mod I level. Pt demonstrated fair + dynamic standing balance.  Light housekeeping activities of cleaning up towels, making bed, putting items away at a mod I level. Visit 2:  Pt seen for dynamic standing balance activities with forward and side stepping, heel and toe raises, knee lifts.  Pt can do these exercises at home while steadying herself at kitchen counter.   Patient has met 8 of 8 long term goals due to improved balance, postural control, ability to compensate for deficits and improved coordination.  Patient to discharge at overall Modified Independent level.  Pt will be going to an assisted living facility after discharge for a short time prior to transitioning home.  Reasons goals not met: not applicable  Recommendation:  Patient will benefit from ongoing skilled OT services in assisted living facility to continue to advance functional skills in the area of iADL.  Equipment: No equipment provided  Reasons for discharge: treatment goals met  Patient/family agrees with progress made and goals achieved: Yes  OT Discharge Precautions/Restrictions  Precautions Precautions: ICD/Pacemaker Pain Pain Assessment Pain Assessment: No/denies pain ADL ADL Eating: Independent Grooming: Independent Where Assessed-Grooming: Standing at sink Upper Body Bathing: Independent Where Assessed-Upper Body Bathing: Shower Lower Body Bathing: Modified independent Where Assessed-Lower Body Bathing: Shower Upper Body Dressing: Independent Where  Assessed-Upper Body Dressing: Chair Lower Body Dressing: Independent Where Assessed-Lower Body Dressing: Chair Toileting: Independent Where Assessed-Toileting: Teacher, adult education: Community education officer Method: Ambulating Tub/Shower Transfer: Modified independent Tub/Shower Transfer Method: Ship broker: Grab bars;Shower seat with back Film/video editor: Close supervision Film/video editor Method: Designer, industrial/product: Emergency planning/management officer Vision/Perception  Vision - History Baseline Vision: No visual deficits Vision - Assessment Eye Alignment: Within Media planner Overall Cognitive Status: Appears within functional limits for tasks assessed Orientation Level: Oriented X4 Sensation Sensation Light Touch: Impaired Detail Light Touch Impaired Details: Impaired RLE;Impaired LLE Proprioception: Appears Intact Coordination Gross Motor Movements are Fluid and Coordinated: No Heel Shin Test: Slight dysmetria secondary to weakness Motor  Motor Motor: Hemiplegia Motor - Discharge Observations: L sided weakness Mobility  Bed Mobility Sit to Supine: 7: Independent   Balance Dynamic Standing Balance Dynamic Standing - Level of Assistance: 6: Modified independent (Device/Increase time) Dynamic Standing - Comments: WFL for BADL activities    LUE Assessment LUE Assessment: Within Functional Limits  See FIM for current functional status  Erick Murin 05/25/2011, 9:36 AM

## 2011-06-05 ENCOUNTER — Other Ambulatory Visit: Payer: Self-pay

## 2011-06-05 MED ORDER — PANTOPRAZOLE SODIUM 40 MG PO TBEC: 40.0000 mg | DELAYED_RELEASE_TABLET | Freq: Every day | ORAL | Status: DC

## 2011-06-11 ENCOUNTER — Other Ambulatory Visit: Payer: Self-pay | Admitting: Cardiovascular Disease

## 2011-06-11 ENCOUNTER — Ambulatory Visit (INDEPENDENT_AMBULATORY_CARE_PROVIDER_SITE_OTHER): Payer: Medicare Other | Admitting: Emergency Medicine

## 2011-06-11 VITALS — BP 108/62 | HR 89 | Temp 97.8°F | Resp 18 | Ht 64.5 in | Wt 160.2 lb

## 2011-06-11 DIAGNOSIS — K921 Melena: Secondary | ICD-10-CM

## 2011-06-11 DIAGNOSIS — I639 Cerebral infarction, unspecified: Secondary | ICD-10-CM

## 2011-06-11 DIAGNOSIS — M549 Dorsalgia, unspecified: Secondary | ICD-10-CM

## 2011-06-11 DIAGNOSIS — I6789 Other cerebrovascular disease: Secondary | ICD-10-CM

## 2011-06-11 DIAGNOSIS — K625 Hemorrhage of anus and rectum: Secondary | ICD-10-CM

## 2011-06-11 LAB — POCT CBC
Hemoglobin: 12.4 g/dL (ref 12.2–16.2)
Lymph, poc: 2.1 (ref 0.6–3.4)
MCH, POC: 27.8 pg (ref 27–31.2)
MCHC: 31.9 g/dL (ref 31.8–35.4)
MID (cbc): 0.6 (ref 0–0.9)
POC Granulocyte: 5.2 (ref 2–6.9)
POC LYMPH PERCENT: 26.5 %L (ref 10–50)
POC MID %: 8.2 %M (ref 0–12)
RDW, POC: 14.3 %
WBC: 7.9 10*3/uL (ref 4.6–10.2)

## 2011-06-11 MED ORDER — METHOCARBAMOL 500 MG PO TABS: 500.0000 mg | ORAL_TABLET | Freq: Four times a day (QID) | ORAL | Status: DC

## 2011-06-11 MED ORDER — PANTOPRAZOLE SODIUM 40 MG PO TBEC: 40.0000 mg | DELAYED_RELEASE_TABLET | Freq: Every day | ORAL | Status: DC

## 2011-06-11 MED ORDER — CODEINE SULFATE 15 MG PO TABS: 15.0000 mg | ORAL_TABLET | Freq: Four times a day (QID) | ORAL | Status: AC | PRN

## 2011-06-11 NOTE — Progress Notes (Signed)
  Subjective:    Patient ID: [name], female    DOB: [birth year], 78 y.o.   MRN: [id number]  HPI patient here to followup recent hospitalization for stroke she is doing well to therapy. She is concerned she may be having some rectal bleeding at the present time. She is currently on Coumadin for cardiac issues. She did test positive for Helicobacter pylori and is wanting to take medications. Dr. Kirstie Peri has been against treatment of this due to her interference with Coumadin dosage. The stroke was a nonhemorrhagic CVA in the right frontal area    Review of Systems is some tenderness over the midthoracic area and lower thoracic area bilaterally these are in the peri-spinous areas.     Objective:   Physical Exam  HENT:  Head: Normocephalic.  Eyes: Pupils are equal, round, and reactive to light.  Neck: Neck supple.  Cardiovascular:       There is a 1/6 systolic murmur left sternal border cardiac exam is irregular the  Abdominal:       Rectal exam was performed. Rectal vault was empty stool sent for he injured  Neurological:       There is a residual weakness of the left arm. Cranial nerves are intact. Patient does have a wide-based ataxic gait.    Results for orders placed in visit on 06/11/11  IFOBT (OCCULT BLOOD)      Component Value Range   IFOBT Positive    POCT CBC      Component Value Range   WBC 7.9  4.6 - 10.2 (K/uL)   Lymph, poc 2.1  0.6 - 3.4    POC LYMPH PERCENT 26.5  10 - 50 (%L)   MID (cbc) 0.6  0 - 0.9    POC MID % 8.2  0 - 12 (%M)   POC Granulocyte 5.2  2 - 6.9    Granulocyte percent 65.3  37 - 80 (%G)   RBC 4.46  4.04 - 5.48 (M/uL)   Hemoglobin 12.4  12.2 - 16.2 (g/dL)   HCT, POC 66.0  63.0 - 47.9 (%)   MCV 87.3  80 - 97 (fL)   MCH, POC 27.8  27 - 31.2 (pg)   MCHC 31.9  31.8 - 35.4 (g/dL)   RDW, POC 16.0     Platelet Count, POC 307  142 - 424 (K/uL)   MPV 9.9  0 - 99.8 (fL)        Assessment & Plan:  Apparently she is having some rectal bleeding.  Hemoglobin is stable at 12.4. Advised her discussed driving issues with her neurologist. I called in a prescription for Robaxin for the back discomfort she is experiencing.

## 2011-06-12 ENCOUNTER — Encounter: Payer: Self-pay | Admitting: Physical Medicine & Rehabilitation

## 2011-06-12 ENCOUNTER — Encounter: Payer: Medicare Other | Attending: Physical Medicine & Rehabilitation

## 2011-06-12 ENCOUNTER — Ambulatory Visit (HOSPITAL_BASED_OUTPATIENT_CLINIC_OR_DEPARTMENT_OTHER): Payer: Medicare Other | Admitting: Physical Medicine & Rehabilitation

## 2011-06-12 VITALS — BP 116/48 | HR 79 | Resp 16 | Ht 65.0 in | Wt 161.0 lb

## 2011-06-12 DIAGNOSIS — Z950 Presence of cardiac pacemaker: Secondary | ICD-10-CM | POA: Insufficient documentation

## 2011-06-12 DIAGNOSIS — Z7901 Long term (current) use of anticoagulants: Secondary | ICD-10-CM | POA: Insufficient documentation

## 2011-06-12 DIAGNOSIS — I69998 Other sequelae following unspecified cerebrovascular disease: Secondary | ICD-10-CM | POA: Insufficient documentation

## 2011-06-12 DIAGNOSIS — I6340 Cerebral infarction due to embolism of unspecified cerebral artery: Secondary | ICD-10-CM

## 2011-06-12 DIAGNOSIS — R279 Unspecified lack of coordination: Secondary | ICD-10-CM | POA: Insufficient documentation

## 2011-06-12 DIAGNOSIS — R29898 Other symptoms and signs involving the musculoskeletal system: Secondary | ICD-10-CM | POA: Insufficient documentation

## 2011-06-12 NOTE — Patient Instructions (Signed)

## 2011-06-12 NOTE — Progress Notes (Signed)
  Subjective:    Patient ID: [name], female    DOB: 05-16-1923, 78 y.o.   MRN: [id number]  HPI  This is an 78 year old right-handed female with history of atrial  fibrillation, pacemaker, chronic Coumadin therapy who was admitted on  May 16, 2011, with left-sided weakness. Cranial CT scan showed small  acute nonhemorrhagic mid to posterior right frontal lobe infarction.  MRI could not be completed due to pacemaker. Carotid Dopplers with no  ICA stenosis. Echocardiogram with ejection fraction of 70% without  emboli. Neurology consulted and remained on Coumadin therapy with INR  1.71. Gastroenterology was consulted on May 19, 2011, for maroon  stool x4 without associated abdominal pain or nausea. There was also  reported nosebleed on May 14, 2011, that resolved on its own without  recurrence. Underwent an endoscopy on May 19, 2011, showing small  benign-appearing antral ulcerations without any bleeding. No blood  products or rectal bleeding seen. Suspect gastric findings unrelated to  her melena. Felt recent epistaxis could be the source of recent melena.  Placed on Protonix therapy. He was advised to continue Coumadin  Discharged to assisted living for probably one week after rehabilitation. She has been at home for approximately one week. No falls. Independent with all her activities including cooking. Home health nurse has come out to draw INR. Patient saw her primary physician yesterday. Pain Inventory Average Pain 0 Pain Right Now 0 My pain is N/A  In the last 24 hours, has pain interfered with the following? General activity 0 Relation with others 0 Enjoyment of life 0 What TIME of day is your pain at its worst? N/A Sleep (in general) Fair  Pain is worse with: N/A Pain improves with: N/A Relief from Meds: N/A  Mobility walk without assistance how many minutes can you walk? 10 ability to climb steps?  yes do you drive?   yes  Function retired  Neuro/Psych No problems in this area  Prior Studies Any changes since last visit?  no  Physicians involved in your care Any changes since last visit?  no  Review of Systems  Constitutional: Negative.   HENT: Negative.   Eyes: Negative.   Respiratory: Negative.   Cardiovascular: Negative.   Gastrointestinal: Positive for constipation.  Genitourinary: Negative.   Musculoskeletal: Negative.   Skin: Negative.   Neurological: Negative.   Hematological: Negative.   Psychiatric/Behavioral: Negative.        Objective:   Physical Exam  Eyes: EOM are normal.    4/5 strength in the left hip flexor knee extensor ankle dorsiflexor I. Lateral upper extremities are 5/5 right lower extremity is 5/5 Romberg is negative Gait has slightly widened base of support no evidence of toe drag or knee instability Mood and affect are appropriate Oriented x3 Visual fields are intact to confrontation testing      Assessment & Plan:  1. Right frontal CVA with residual balance disorder and mild left lower extremity weakness. She is able to go back to driving in a graduated fashion. I've given her instructions. These are listed in the patient instructions section. I recommend outpatient physical therapy. I will see her back in one month.

## 2011-06-15 ENCOUNTER — Telehealth: Payer: Self-pay

## 2011-06-15 NOTE — Telephone Encounter (Signed)
PT STATES MUSCLE RELAXANT NOT HELPING, PLEASE ADVISE  BEST PHONE [phone]  [facility]

## 2011-06-16 NOTE — Telephone Encounter (Signed)
Have her try taking 1 1/2 robaxin tablets every 6-8 hours

## 2011-06-16 NOTE — Telephone Encounter (Signed)
Can we try anything different to help with her back pain?

## 2011-06-17 NOTE — Telephone Encounter (Signed)
Advised pt to try the 1 and 1/2 tabs Q6-8 hrs. Pt agreed to try.

## 2011-06-18 ENCOUNTER — Ambulatory Visit: Payer: Medicare Other | Attending: Physical Medicine & Rehabilitation | Admitting: Physical Therapy

## 2011-06-18 DIAGNOSIS — IMO0001 Reserved for inherently not codable concepts without codable children: Secondary | ICD-10-CM | POA: Insufficient documentation

## 2011-06-18 DIAGNOSIS — R269 Unspecified abnormalities of gait and mobility: Secondary | ICD-10-CM | POA: Insufficient documentation

## 2011-06-18 DIAGNOSIS — M6281 Muscle weakness (generalized): Secondary | ICD-10-CM | POA: Insufficient documentation

## 2011-06-19 ENCOUNTER — Ambulatory Visit: Payer: Medicare Other | Admitting: Family Medicine

## 2011-06-19 VITALS — BP 112/66 | HR 93 | Temp 98.0°F | Resp 16 | Ht 64.5 in | Wt 162.0 lb

## 2011-06-19 DIAGNOSIS — R739 Hyperglycemia, unspecified: Secondary | ICD-10-CM

## 2011-06-19 DIAGNOSIS — M62838 Other muscle spasm: Secondary | ICD-10-CM

## 2011-06-19 MED ORDER — CYCLOBENZAPRINE HCL 10 MG PO TABS: 10.0000 mg | ORAL_TABLET | Freq: Every evening | ORAL | Status: AC | PRN

## 2011-06-20 ENCOUNTER — Ambulatory Visit (INDEPENDENT_AMBULATORY_CARE_PROVIDER_SITE_OTHER): Payer: Medicare Other | Admitting: Family Medicine

## 2011-06-20 ENCOUNTER — Inpatient Hospital Stay (HOSPITAL_COMMUNITY)
Admission: AD | Admit: 2011-06-20 | Discharge: 2011-06-24 | DRG: 378 | Disposition: A | Discharge status: Home / Self Care | Payer: Medicare Other | Source: Ambulatory Visit | Attending: Family Medicine | Admitting: Family Medicine

## 2011-06-20 ENCOUNTER — Encounter (HOSPITAL_COMMUNITY): Payer: Self-pay | Admitting: *Deleted

## 2011-06-20 VITALS — BP 118/70 | HR 97 | Temp 97.9°F | Resp 16 | Ht 64.5 in | Wt 161.0 lb

## 2011-06-20 DIAGNOSIS — E669 Obesity, unspecified: Secondary | ICD-10-CM | POA: Diagnosis present

## 2011-06-20 DIAGNOSIS — I4821 Permanent atrial fibrillation: Secondary | ICD-10-CM | POA: Diagnosis present

## 2011-06-20 DIAGNOSIS — K921 Melena: Secondary | ICD-10-CM | POA: Diagnosis present

## 2011-06-20 DIAGNOSIS — K625 Hemorrhage of anus and rectum: Secondary | ICD-10-CM

## 2011-06-20 DIAGNOSIS — R609 Edema, unspecified: Secondary | ICD-10-CM

## 2011-06-20 DIAGNOSIS — G8929 Other chronic pain: Secondary | ICD-10-CM | POA: Diagnosis present

## 2011-06-20 DIAGNOSIS — I10 Essential (primary) hypertension: Secondary | ICD-10-CM | POA: Diagnosis present

## 2011-06-20 DIAGNOSIS — K922 Gastrointestinal hemorrhage, unspecified: Principal | ICD-10-CM | POA: Diagnosis present

## 2011-06-20 DIAGNOSIS — I4891 Unspecified atrial fibrillation: Secondary | ICD-10-CM

## 2011-06-20 DIAGNOSIS — R7309 Other abnormal glucose: Secondary | ICD-10-CM

## 2011-06-20 DIAGNOSIS — Z7901 Long term (current) use of anticoagulants: Secondary | ICD-10-CM

## 2011-06-20 DIAGNOSIS — Z823 Family history of stroke: Secondary | ICD-10-CM

## 2011-06-20 DIAGNOSIS — M62838 Other muscle spasm: Secondary | ICD-10-CM

## 2011-06-20 DIAGNOSIS — I319 Disease of pericardium, unspecified: Secondary | ICD-10-CM | POA: Diagnosis present

## 2011-06-20 DIAGNOSIS — Z8673 Personal history of transient ischemic attack (TIA), and cerebral infarction without residual deficits: Secondary | ICD-10-CM

## 2011-06-20 DIAGNOSIS — D62 Acute posthemorrhagic anemia: Secondary | ICD-10-CM | POA: Diagnosis present

## 2011-06-20 DIAGNOSIS — Z950 Presence of cardiac pacemaker: Secondary | ICD-10-CM

## 2011-06-20 DIAGNOSIS — M549 Dorsalgia, unspecified: Secondary | ICD-10-CM | POA: Diagnosis present

## 2011-06-20 HISTORY — DX: Cerebral infarction, unspecified: I63.9

## 2011-06-20 LAB — COMPREHENSIVE METABOLIC PANEL
AST: 21 U/L (ref 0–37)
Albumin: 4.2 g/dL (ref 3.5–5.2)
BUN: 31 mg/dL — ABNORMAL HIGH (ref 6–23)
BUN: 28 mg/dL — ABNORMAL HIGH (ref 6–23)
CO2: 29 mEq/L (ref 19–32)
Calcium: 9.3 mg/dL (ref 8.4–10.5)
Calcium: 9.4 mg/dL (ref 8.4–10.5)
Chloride: 103 mEq/L (ref 96–112)
Chloride: 102 mEq/L (ref 96–112)
Creatinine, Ser: 0.93 mg/dL (ref 0.50–1.10)
GFR calc Af Amer: 63 mL/min — ABNORMAL LOW (ref >90)
GFR calc non Af Amer: 54 mL/min — ABNORMAL LOW (ref >90)
Glucose, Bld: 94 mg/dL (ref 70–99)
Potassium: 4.2 mEq/L (ref 3.5–5.3)
Total Bilirubin: 0.3 mg/dL (ref 0.3–1.2)

## 2011-06-20 LAB — HEMOGLOBIN AND HEMATOCRIT, BLOOD
HCT: 26.9 % — ABNORMAL LOW (ref 36.0–46.0)
Hemoglobin: 8.8 g/dL — ABNORMAL LOW (ref 12.0–15.0)

## 2011-06-20 LAB — CBC
HCT: 29.1 % — ABNORMAL LOW (ref 36.0–46.0)
Hemoglobin: 9.4 g/dL — ABNORMAL LOW (ref 12.0–15.0)
MCH: 27.8 pg (ref 26.0–34.0)
MCHC: 32.3 g/dL (ref 30.0–36.0)
RDW: 13 % (ref 11.5–15.5)

## 2011-06-20 LAB — POCT CBC
Granulocyte percent: 69.9 % (ref 37–80)
HCT, POC: 34.4 % — AB (ref 37.7–47.9)
Hemoglobin: 10.5 g/dL — AB (ref 12.2–16.2)
Lymph, poc: 1.9 (ref 0.6–3.4)
MCH, POC: 26.7 pg — AB (ref 27–31.2)
MCHC: 30.5 g/dL — AB (ref 31.8–35.4)
MCV: 87.6 fL (ref 80–97)
MID (cbc): 0.8 (ref 0–0.9)
MPV: 9.9 fL (ref 0–99.8)
POC Granulocyte: 6.3 (ref 2–6.9)
POC LYMPH PERCENT: 21 % (ref 10–50)
POC MID %: 9.1 % (ref 0–12)
Platelet Count, POC: 327 10*3/uL (ref 142–424)
RBC: 3.93 M/uL — AB (ref 4.04–5.48)
RDW, POC: 13.9 %
WBC: 9 10*3/uL (ref 4.6–10.2)

## 2011-06-20 LAB — PROTIME-INR: INR: 3.21 — ABNORMAL HIGH (ref 0.00–1.49)

## 2011-06-20 LAB — IFOBT (OCCULT BLOOD): IFOBT: POSITIVE

## 2011-06-20 MED ORDER — OXYCODONE HCL 5 MG PO TABS
5.0000 mg | ORAL_TABLET | ORAL | Status: DC | PRN
  Administered 2011-06-22 – 2011-06-24 (×3): 5 mg via ORAL
  Filled 2011-06-20 (×4): qty 1

## 2011-06-20 MED ORDER — TORSEMIDE 20 MG PO TABS
20.0000 mg | ORAL_TABLET | Freq: Every day | ORAL | Status: DC
Start: 2011-06-21 — End: 2011-06-21
  Filled 2011-06-20: qty 1

## 2011-06-20 MED ORDER — TRAZODONE HCL 50 MG PO TABS
50.0000 mg | ORAL_TABLET | Freq: Every evening | ORAL | Status: DC | PRN
  Filled 2011-06-20: qty 1

## 2011-06-20 MED ORDER — PANTOPRAZOLE SODIUM 40 MG PO TBEC
40.0000 mg | DELAYED_RELEASE_TABLET | Freq: Every day | ORAL | Status: DC
  Administered 2011-06-20 – 2011-06-24 (×5): 40 mg via ORAL
  Filled 2011-06-20 (×5): qty 1

## 2011-06-20 MED ORDER — VERAPAMIL HCL ER 120 MG PO TBCR
120.0000 mg | EXTENDED_RELEASE_TABLET | Freq: Two times a day (BID) | ORAL | Status: DC
  Administered 2011-06-20 – 2011-06-24 (×8): 120 mg via ORAL
  Filled 2011-06-20 (×9): qty 1

## 2011-06-20 MED ORDER — ONDANSETRON HCL 4 MG/2ML IJ SOLN: 4.0000 mg | Freq: Four times a day (QID) | INTRAMUSCULAR | Status: DC | PRN

## 2011-06-20 MED ORDER — DIGOXIN 125 MCG PO TABS
0.1250 mg | ORAL_TABLET | Freq: Every day | ORAL | Status: DC
  Filled 2011-06-20: qty 1

## 2011-06-20 MED ORDER — ONDANSETRON HCL 4 MG PO TABS: 4.0000 mg | ORAL_TABLET | Freq: Four times a day (QID) | ORAL | Status: DC | PRN

## 2011-06-20 MED ORDER — KCL IN DEXTROSE-NACL 20-5-0.45 MEQ/L-%-% IV SOLN
INTRAVENOUS | Status: DC
  Administered 2011-06-20 – 2011-06-21 (×2): via INTRAVENOUS
  Filled 2011-06-20 (×5): qty 1000

## 2011-06-20 MED ORDER — ACETAMINOPHEN 325 MG PO TABS
650.0000 mg | ORAL_TABLET | Freq: Four times a day (QID) | ORAL | Status: DC | PRN
  Administered 2011-06-20 – 2011-06-21 (×4): 650 mg via ORAL
  Filled 2011-06-20 (×4): qty 2

## 2011-06-20 MED ORDER — ACETAMINOPHEN 650 MG RE SUPP: 650.0000 mg | Freq: Four times a day (QID) | RECTAL | Status: DC | PRN

## 2011-06-20 MED ORDER — DIGOXIN 125 MCG PO TABS
0.1250 mg | ORAL_TABLET | Freq: Every day | ORAL | Status: DC
  Administered 2011-06-20 – 2011-06-24 (×5): 0.125 mg via ORAL
  Filled 2011-06-20 (×5): qty 1

## 2011-06-20 MED ORDER — VERAPAMIL HCL 240 MG (CO) PO TB24: 240.0000 mg | ORAL_TABLET | Freq: Every day | ORAL | Status: DC

## 2011-06-20 MED ORDER — SODIUM CHLORIDE 0.9 % IV BOLUS (SEPSIS)
1000.0000 mL | Freq: Once | INTRAVENOUS | Status: AC
  Administered 2011-06-20: 1000 mL via INTRAVENOUS

## 2011-06-20 MED ORDER — SODIUM CHLORIDE 0.9 % IJ SOLN
3.0000 mL | Freq: Two times a day (BID) | INTRAMUSCULAR | Status: DC
  Administered 2011-06-21 – 2011-06-23 (×6): 3 mL via INTRAVENOUS

## 2011-06-20 NOTE — H&P (Signed)
Family Medicine Teaching [facility] Admission History and Physical  Patient name: [name] Medical record number: [id number] Date of birth: [birth year] Age: 77 y.o. Gender: female  Primary Care Provider: Lucilla Edin, MD, MD  Chief Complaint:  History of Present Illness: [name] is an 78 y.o. year old female with a PMH of GI bleed and chronic anti-coagulation for atrial fibrillation presenting with melena and an acute decrease in hemoglobin. [name] normally had 4-5 bowel movements last night that were dark, black, and shiny. She has a history of melena from a GI bleed, so she recognized it as such. This melena was not accompanied by any other symptoms including nausea, vomiting, hematemesis, lightheadedness, syncope, or chest pain. This morning she went to her niece's house to have her INR checked, because she was worried about it being elevated. According the the patient, her INR was 4.0, which is an increase from 2.4 on Monday. She denies it having ever been this high before. She went for evaluation at [facility] and was found to have a heme positive stool and a hemoglobin of 10.5. Her hemoglobin was 12.4 on 5/16, so she was sent to [facility] for further evaluation of a GI bleed. Of note, the patient had an EGD performed on 4/23 due to having bloody stools by Dr. Charlott Rakes that did not reveal any recent bleeding. Her last colonoscopy was about a year ago, and she was found to her diverticulosis.   She denies any significant changes to her diet or new medications (including herbal medications) recently.    Patient Active Problem List  Diagnoses  . Atrial fibrillation  . Pericardial effusion  . Chronic anticoagulation  . HTN (hypertension)  . Edema  . Pacemaker  . Weakness of left side of body  . Elevated digoxin level  . GI bleed  . CVA (cerebral infarction)  . Cardioembolic stroke   Past Medical History: Past Medical History    Diagnosis Date  . Pericardial effusion     Chronic. Last echo in April of 2012.   . Atrial fibrillation     Chronic  . Chronic anticoagulation     on Coumadin  . History of GI bleed     once treated with Fe infusion  . HTN (hypertension)   . Sleep apnea   . Pacemaker     placed for tachybrady.   . Valvular heart disease   . Obesity   . LVH (left ventricular hypertrophy)   . Stroke April 2013    right frontal    Past Surgical History: Past Surgical History  Procedure Date  . Pacemaker insertion     Last generator in 2006  . Breast lumpectomy     bilaterally  . Tonsillectomy   . Adenoidectomy   . Esophagogastroduodenoscopy 05/19/2011    Procedure: ESOPHAGOGASTRODUODENOSCOPY (EGD);  Surgeon: Shirley Friar, MD;  Location: [facility] ENDOSCOPY;  Service: Endoscopy;  Laterality: N/A;  doctor aware of inr   will try to be here no later than 230    Social History: History   Social History  . Marital Status: Widowed    Spouse Name: N/A    Number of Children: N/A  . Years of Education: N/A   Social History Main Topics  . Smoking status: Never Smoker   . Smokeless tobacco: Never Used  . Alcohol Use: No  . Drug Use: No  . Sexually Active: No   Other Topics Concern  . None  Social History Narrative  . None    Family History: Family History  Problem Relation Age of Onset  . Stroke Mother   . Stroke Father   . Pneumonia Father     Allergies: No Known Allergies  Current Facility-Administered Medications  Medication Dose Route Frequency Provider Last Rate Last Dose  . acetaminophen (TYLENOL) tablet 650 mg  650 mg Oral Q6H PRN Garnetta Buddy, MD   650 mg at 06/20/11 1723   Or  . acetaminophen (TYLENOL) suppository 650 mg  650 mg Rectal Q6H PRN Garnetta Buddy, MD      . dextrose 5 % and 0.45 % NaCl with KCl 20 mEq/L infusion   Intravenous Continuous Garnetta Buddy, MD      . digoxin Margit Banda) tablet 0.125 mg  0.125 mg Oral Daily Garnetta Buddy, MD      . ondansetron [facility]) tablet 4 mg  4 mg Oral Q6H PRN Garnetta Buddy, MD       Or  . ondansetron [facility]) injection 4 mg  4 mg Intravenous Q6H PRN Garnetta Buddy, MD      . oxyCODONE (Oxy IR/ROXICODONE) immediate release tablet 5 mg  5 mg Oral Q4H PRN Garnetta Buddy, MD      . pantoprazole (PROTONIX) EC tablet 40 mg  40 mg Oral Q1200 Garnetta Buddy, MD   40 mg at 06/20/11 1723  . sodium chloride 0.9 % injection 3 mL  3 mL Intravenous Q12H Garnetta Buddy, MD      . torsemide [facility]) tablet 20 mg  20 mg Oral Daily Garnetta Buddy, MD      . traZODone (DESYREL) tablet 50 mg  50 mg Oral QHS PRN Garnetta Buddy, MD      . verapamil (CALAN-SR) CR tablet 120 mg  120 mg Oral BID Garnetta Buddy, MD      . DISCONTD: verapamil (COVERA HS) 24 hr tablet 240 mg  240 mg Oral QHS Garnetta Buddy, MD       Review Of Systems: Per HPI with the following additions: pain in back; left foot weakness secondary to stroke in April 2013  Otherwise 12 point review of systems was performed and was unremarkable, including no chest pain/palpitations/difficulty breathing/dysuria/nausea/vomiting. She denies feeling unwell prior to admission.  Her last bowel movement was this morning.   Physical Exam: BP 139/70  Pulse 102  Temp(Src) 97.2 F (36.2 C) (Oral)  Resp 18  Ht 5\' 5"  (1.651 m)  Wt 160 lb 11.2 oz (72.893 kg)  BMI 26.74 kg/m2  SpO2 100% General: elderly white female, very pleasant, appears stated age speaks and carries herself like a younger active person HEENT: NCAT, PERRLA, EOMI, mucous membranes dry, OP clear, conjunctival pallor bilaterally  Heart: regularly irregular, 2/6 systolic murmur Chest: Pacemaker under skin to right of sternum Lungs: clear to auscultation, no wheezes or rales and unlabored breathing Abdomen: abdomen is soft without significant tenderness, masses, organomegaly or guarding Extremities: 1+ edema of lower extremities  bilaterally Skin:no rashes, no ecchymoses Neurology: normal without focal findings, mental status, speech normal, alert and oriented x3 and PERLA  PGY-2-addendum: CV: irregularly irregular, 2-3/6 systolic murmur throughout,  Pulm: NI WOB, CTAB without w/r/r Abd: NABS, soft, ND; mild TTP left lower quadrant Ext: no edema Skin: warm, dry, intact, no significant bruising; several cherry hemangiomas throughout Neuro:   CNII-XII intact. PERRL, no facial droop, good shoulder and neck strength   Sensation: intact throughout  Strength: 4/5 weakness left leg and foot, otherwise 5/5 all other extremities Psych: alert and oriented x 3, engaged, appropriate to questions  Labs and Imaging:  Results for orders placed during the hospital encounter of 06/20/11 (from the past 24 hour(s))  CBC     Status: Abnormal   Collection Time   06/20/11  4:35 PM      Component Value Range   WBC 7.8  4.0 - 10.5 (K/uL)   RBC 3.38 (*) 3.87 - 5.11 (MIL/uL)   Hemoglobin 9.4 (*) 12.0 - 15.0 (g/dL)   HCT 16.1 (*) 09.6 - 46.0 (%)   MCV 86.1  78.0 - 100.0 (fL)   MCH 27.8  26.0 - 34.0 (pg)   MCHC 32.3  30.0 - 36.0 (g/dL)   RDW 04.5  40.9 - 81.1 (%)   Platelets 281  150 - 400 (K/uL)  PROTIME-INR     Status: Abnormal   Collection Time   06/20/11  4:35 PM      Component Value Range   Prothrombin Time 33.3 (*) 11.6 - 15.2 (seconds)   INR 3.21 (*) 0.00 - 1.49   COMPREHENSIVE METABOLIC PANEL     Status: Abnormal   Collection Time   06/20/11  4:35 PM      Component Value Range   Sodium 139  135 - 145 (mEq/L)   Potassium 3.7  3.5 - 5.1 (mEq/L)   Chloride 102  96 - 112 (mEq/L)   CO2 29  19 - 32 (mEq/L)   Glucose, Bld 129 (*) 70 - 99 (mg/dL)   BUN 28 (*) 6 - 23 (mg/dL)   Creatinine, Ser 9.14  0.50 - 1.10 (mg/dL)   Calcium 9.4  8.4 - 78.2 (mg/dL)   Total Protein 6.2  6.0 - 8.3 (g/dL)   Albumin 3.2 (*) 3.5 - 5.2 (g/dL)   AST 16  0 - 37 (U/L)   ALT 12  0 - 35 (U/L)   Alkaline Phosphatase 67  39 - 117 (U/L)   Total  Bilirubin 0.3  0.3 - 1.2 (mg/dL)   GFR calc non Af Amer 54 (*) >90 (mL/min)   GFR calc Af Amer 63 (*) >90 (mL/min)     Assessment and Plan: [name] is a 78 y.o. year old female with atrial fibrillation and a history of GI bleed who presents with melena, anemia and a supratherapeutic INR.   # GI Bleed - source may be upper or lower, but not likely not a variceal bleed since no liver problems nor a mallory-weis tear since no wretching or vomiting; if lower, then may need scope to rule out cancer however, likely combination of supratherapeutic INR and diverticulosis  - GI doctor covering for Dr. Matthias Hughs is Dr. Dulce Sellar, and he was notified by Dr. Hal Hope prior to admission - give protonix 40 mg BID - NPO, MIVF, place to 2 large bore IV in case fluid resuscitation needed - check Hgb serially q 4-5 hours>>>Hgb around 16:00 today shows drop to 9.4. Will re-check around 22:00.  - hold coumadin - transfuse for Hgb < 7.0  # Atrial Fibrillation - hold coumadin, place on telemetry  - continue digoxin   - consider [place] cards consultation   # HTN - continue home verapamil   # Back Pain - likely MSK, heating pad and oxycodone for now  # CVA - recent right frontal infarct 1 month ago, minimal deficit, so no action needed now  # FENGI -  - NPO - MIVF D51/2NS  w/ KCl @ 100>>>Will consider increasing if vital signs worsen.   # PPX - anticoagulation held until certain of no bleeding  # Dispo - patient admitted to telemetry under Family Medicine Teaching Service, considering move to [facility] if Hgb dropping and needs transfusion  # Code - limited, no compression or intubation; electrical cardioversion and meds are OK    Si Raider. Clinton Sawyer, PGY-1 Family Medicine [phone]   PGY-2 Addendum I have spoken with and examined the patient and agree with Dr. Yetta Numbers assessment and plan. A couple of my addendums are in purple.   No clear cause for why INR has become significantly  elevated over the past few days.   I agree with Dr. Clinton Sawyer and think the most probable cause for her anemia is diverticular bleeding due to supratherpeutic INR. She is having some mild left lower quadrant tenderness.   We will observe her closely overnight for any hemodynamic instability. Will will start fluids at 100 mL/hr and increase as needed; she has no history of systolic or diastolic heart failure (recent ECHO 04/2011 showed EF 65-70% with AS/AR, and MVR.   We will observe her on telemetry due to her history of atrial fibrillation. She does have a pacemaker and denies any cardiac symptoms at this time.  We will follow-up on GI's recommendations. She is no longer having any blood stools (no bowel movements since this morning).    Priscella Mann, PGY-2 Family Medicine [phone]

## 2011-06-20 NOTE — Progress Notes (Signed)
  Subjective:    Patient ID: [name], female    DOB: 10-Aug-1923, 78 y.o.   MRN: [id number]  HPI  Patient presents complaining of  black tarry stools X 6 overnight.  UGI  Negative 1 month ago  INR 4.0 today  (Niece has PT/INR machine at home) Denies orthostasis or weakness.   PMH/ Chronic anticoagulation secondary to atrial fibrillation            Diverticulosis/ AV malformation             H/O colonic polyps  Review of Systems     Objective:   Physical Exam  Constitutional: She appears well-developed.  HENT:  Mouth/Throat: Oropharynx is clear and moist.  Neck: Neck supple. No thyromegaly present.  Cardiovascular: Normal rate, regular rhythm and normal heart sounds.   Pulmonary/Chest: Effort normal and breath sounds normal.  Abdominal: Soft. She exhibits no mass. There is tenderness (LLQ). There is no rebound.       Hyperactive BS  Neurological: She is alert.  Skin: Skin is warm.      Results for orders placed in visit on 06/20/11  POCT CBC      Component Value Range   WBC 9.0  4.6 - 10.2 (K/uL)   Lymph, poc 1.9  0.6 - 3.4    POC LYMPH PERCENT 21.0  10 - 50 (%L)   MID (cbc) 0.8  0 - 0.9    POC MID % 9.1  0 - 12 (%M)   POC Granulocyte 6.3  2 - 6.9    Granulocyte percent 69.9  37 - 80 (%G)   RBC 3.93 (*) 4.04 - 5.48 (M/uL)   Hemoglobin 10.5 (*) 12.2 - 16.2 (g/dL)   HCT, POC 40.9 (*) 81.1 - 47.9 (%)   MCV 87.6  80 - 97 (fL)   MCH, POC 26.7 (*) 27 - 31.2 (pg)   MCHC 30.5 (*) 31.8 - 35.4 (g/dL)   RDW, POC 91.4     Platelet Count, POC 327  142 - 424 (K/uL)   MPV 9.9  0 - 99.8 (fL)  IFOBT (OCCULT BLOOD)      Component Value Range   IFOBT Positive         Assessment & Plan:   1. Recurrent GI bleeding  POCT CBC, IFOBT POC (occult bld, rslt in office)   Cecum, angiodysplasia Sigmoid colon diverticulum H/O rectal adenoma  2. Chronic anticoagulation    3. Atrial fibrillation      Patient to be admitted to [facility] teaching service for GI bleed Dr. Dulce Sellar  covering for Dr. Matthias Hughs notified

## 2011-06-20 NOTE — Progress Notes (Signed)
Pt. Had small bowel movement-more of a smear that was burgundy in color.  Will continue to monitor.

## 2011-06-21 ENCOUNTER — Encounter (HOSPITAL_COMMUNITY)
Admission: AD | Disposition: A | Discharge status: Home / Self Care | Payer: Self-pay | Source: Ambulatory Visit | Attending: Family Medicine

## 2011-06-21 ENCOUNTER — Encounter: Payer: Self-pay | Admitting: *Deleted

## 2011-06-21 ENCOUNTER — Ambulatory Visit (HOSPITAL_COMMUNITY): Admit: 2011-06-21 | Payer: Self-pay | Admitting: Gastroenterology

## 2011-06-21 LAB — BASIC METABOLIC PANEL
BUN: 25 mg/dL — ABNORMAL HIGH (ref 6–23)
Chloride: 104 mEq/L (ref 96–112)
GFR calc Af Amer: 69 mL/min — ABNORMAL LOW (ref >90)
GFR calc non Af Amer: 59 mL/min — ABNORMAL LOW (ref >90)
Potassium: 4.4 mEq/L (ref 3.5–5.1)
Sodium: 136 mEq/L (ref 135–145)

## 2011-06-21 LAB — CBC
Hemoglobin: 7.9 g/dL — ABNORMAL LOW (ref 12.0–15.0)
MCH: 27.6 pg (ref 26.0–34.0)
Platelets: 248 10*3/uL (ref 150–400)
RBC: 2.86 MIL/uL — ABNORMAL LOW (ref 3.87–5.11)
WBC: 5.7 10*3/uL (ref 4.0–10.5)

## 2011-06-21 LAB — PROTIME-INR
INR: 1.63 — ABNORMAL HIGH (ref 0.00–1.49)
Prothrombin Time: 35.7 seconds — ABNORMAL HIGH (ref 11.6–15.2)
Prothrombin Time: 19.6 seconds — ABNORMAL HIGH (ref 11.6–15.2)

## 2011-06-21 LAB — HEMOGLOBIN AND HEMATOCRIT, BLOOD: Hemoglobin: 10.4 g/dL — ABNORMAL LOW (ref 12.0–15.0)

## 2011-06-21 SURGERY — EGD (ESOPHAGOGASTRODUODENOSCOPY)
Anesthesia: Moderate Sedation

## 2011-06-21 MED ORDER — PHYTONADIONE 5 MG PO TABS
2.5000 mg | ORAL_TABLET | Freq: Once | ORAL | Status: DC
  Filled 2011-06-21: qty 1

## 2011-06-21 MED ORDER — TORSEMIDE 20 MG PO TABS
20.0000 mg | ORAL_TABLET | Freq: Every day | ORAL | Status: DC
  Administered 2011-06-21 – 2011-06-24 (×4): 20 mg via ORAL
  Filled 2011-06-21 (×4): qty 1

## 2011-06-21 MED ORDER — VITAMIN K1 10 MG/ML IJ SOLN
5.0000 mg | Freq: Once | INTRAVENOUS | Status: AC
  Administered 2011-06-21: 5 mg via INTRAVENOUS
  Filled 2011-06-21: qty 0.5

## 2011-06-21 MED ORDER — VITAMIN K1 10 MG/ML IJ SOLN
10.0000 mg | Freq: Once | INTRAVENOUS | Status: DC
  Filled 2011-06-21: qty 1

## 2011-06-21 NOTE — Consult Note (Signed)
[facility] Consultation Note  Referring Provider: Dr. Pearlean Brownie Primary Care Physician:  Lucilla Edin, MD, MD  Reason for Consultation:  Blood in stool  HPI: [name] is a 78 y.o. female on chronic warfarin for atrial fibrillation with history of stroke.  Was in static state of health until a couple days ago.  At that time, she had several episodes of melena.  No abdominal pain, dizziness, pre-syncope, hematemesis.  INR was ~ 4 initially, now ~ 3.2.  She has had no further melena, or any type of bowel movement for that matter, since yesterday evening.  Similar episode one month ago, necessitating endoscopy which showed gastric erosions but no clear source of melena, and epistaxis was thought to be potential source.  For this episode, she denies any epistaxis.  Colonoscopy March 2012 showed cecal AVM (solitary) and sigmoid diverticulosis.   Past Medical History  Diagnosis Date  . Pericardial effusion     Chronic. Last echo in April of 2012.   . Atrial fibrillation     Chronic  . Chronic anticoagulation     on Coumadin  . History of GI bleed     once treated with Fe infusion  . HTN (hypertension)   . Sleep apnea   . Pacemaker     placed for tachybrady.   . Valvular heart disease   . Obesity   . LVH (left ventricular hypertrophy)   . Stroke April 2013    right frontal  . Thrombophlebitis 1967    associated with pregnancy  . Diverticulosis     Past Surgical History  Procedure Date  . Pacemaker insertion 2001    Last generator in 2006; interrogated Dec 2012  . Breast lumpectomy     bilaterally; negative for cancer  . Tonsillectomy 1950  . Adenoidectomy   . Esophagogastroduodenoscopy 05/19/2011    Procedure: ESOPHAGOGASTRODUODENOSCOPY (EGD);  Surgeon: Shirley Friar, MD;  Location[facility] ENDOSCOPY;  Service: Endoscopy;  Laterality: N/A;  doctor aware of inr   will try to be here no later than 230  . Cholecystectomy 2005    Prior to Admission  medications   Medication Sig Start Date End Date Taking? Authorizing Provider  codeine 15 MG tablet Take 1 tablet (15 mg total) by mouth every 6 (six) hours as needed for pain. 06/11/11 06/21/11 Yes Collene Gobble, MD  cyclobenzaprine (FLEXERIL) 10 MG tablet Take 1 tablet (10 mg total) by mouth at bedtime as needed for muscle spasms. 06/19/11 06/29/11 Yes Dois Davenport, MD  digoxin (LANOXIN) 0.125 MG tablet Take 1 tablet (0.125 mg total) by mouth daily. 05/20/11 05/19/12 Yes Amber Nydia Bouton, MD  multivitamin [facility]) per tablet Take 1 tablet by mouth daily.     Yes Historical Provider, MD  pantoprazole (PROTONIX) 40 MG tablet Take 1 tablet (40 mg total) by mouth daily at 12 noon. 06/11/11 06/10/12 Yes Vesta Mixer, MD  torsemide (DEMADEX) 20 MG tablet Take 1 tablet (20 mg total) by mouth daily. 05/20/11 05/19/12 Yes Amber Nydia Bouton, MD  verapamil (COVERA HS) 240 MG (CO) 24 hr tablet Take 240 mg by mouth at bedtime.   Yes Historical Provider, MD  warfarin (COUMADIN) 5 MG tablet Take 2.5-5 mg by mouth. Currently taking 5mg  six days a week and 2.5mg  on Friday. 11/04/10  Yes Vesta Mixer, MD  metolazone (ZAROXOLYN) 2.5 MG tablet  05/13/11   Historical Provider, MD  torsemide (DEMADEX) 20 MG tablet Take 1 tablet (20 mg total) by  mouth daily. 04/24/10 04/24/11  Rosalio Macadamia, NP  traZODone (DESYREL) 50 MG tablet Take 1 tablet (50 mg total) by mouth at bedtime as needed for sleep. 05/20/11 06/19/11  Amber Nydia Bouton, MD    Current Facility-Administered Medications  Medication Dose Route Frequency Provider Last Rate Last Dose  . acetaminophen (TYLENOL) tablet 650 mg  650 mg Oral Q6H PRN Garnetta Buddy, MD   650 mg at 06/21/11 0849   Or  . acetaminophen (TYLENOL) suppository 650 mg  650 mg Rectal Q6H PRN Garnetta Buddy, MD      . dextrose 5 % and 0.45 % NaCl with KCl 20 mEq/L infusion   Intravenous Continuous Shelly Rubenstein, MD 150 mL/hr at 06/21/11 0810    . digoxin (LANOXIN) tablet 0.125 mg   0.125 mg Oral Daily Carney Living, MD   0.125 mg at 06/21/11 1013  . ondansetron (ZOFRAN) tablet 4 mg  4 mg Oral Q6H PRN Garnetta Buddy, MD       Or  . ondansetron [facility]) injection 4 mg  4 mg Intravenous Q6H PRN Garnetta Buddy, MD      . oxyCODONE (Oxy IR/ROXICODONE) immediate release tablet 5 mg  5 mg Oral Q4H PRN Garnetta Buddy, MD      . pantoprazole (PROTONIX) EC tablet 40 mg  40 mg Oral Q1200 Garnetta Buddy, MD   40 mg at 06/20/11 1723  . phytonadione (VITAMIN K) 5 mg in dextrose 5 % 50 mL IVPB  5 mg Intravenous Once Tito Dine, MD   5 mg at 06/21/11 1115  . sodium chloride 0.9 % bolus 1,000 mL  1,000 mL Intravenous Once Garnetta Buddy, MD   1,000 mL at 06/20/11 2142  . sodium chloride 0.9 % injection 3 mL  3 mL Intravenous Q12H Garnetta Buddy, MD   3 mL at 06/21/11 1014  . traZODone (DESYREL) tablet 50 mg  50 mg Oral QHS PRN Garnetta Buddy, MD      . verapamil (CALAN-SR) CR tablet 120 mg  120 mg Oral BID Garnetta Buddy, MD   120 mg at 06/21/11 1014  . DISCONTD: digoxin (LANOXIN) tablet 0.125 mg  0.125 mg Oral Daily Garnetta Buddy, MD      . DISCONTD: phytonadione (VITAMIN K) 10 mg in dextrose 5 % 50 mL IVPB  10 mg Intravenous Once Shelly Rubenstein, MD      . DISCONTD: phytonadione (VITAMIN K) tablet 2.5 mg  2.5 mg Oral Once Shelly Rubenstein, MD      . DISCONTD: phytonadione (VITAMIN K) tablet 2.5 mg  2.5 mg Oral Once Wyline Copas, [name]      . DISCONTD: torsemide (DEMADEX) tablet 20 mg  20 mg Oral Daily Garnetta Buddy, MD      . DISCONTD: verapamil (COVERA HS) 24 hr tablet 240 mg  240 mg Oral QHS Garnetta Buddy, MD        Allergies as of 06/20/2011  . (No Known Allergies)    Family History  Problem Relation Age of Onset  . Stroke Mother   . Stroke Father   . Pneumonia Father     History   Social History  . Marital Status: Widowed    Spouse Name: N/A    Number of Children: N/A  . Years of Education: N/A     Occupational History  . Not on file.   Social History Main Topics  .  Smoking status: Never Smoker   . Smokeless tobacco: Never Used  . Alcohol Use: No  . Drug Use: No  . Sexually Active: No   Other Topics Concern  . Not on file   Social History Narrative  . No narrative on file    Review of Systems: Positive = bold Gen: Denies any fever, chills, rigors, night sweats, anorexia, fatigue, weakness, malaise, involuntary weight loss, and sleep disorder CV: Denies chest pain, angina, palpitations, syncope, orthopnea, PND, peripheral edema, and claudication. Resp: Denies dyspnea, cough, sputum, wheezing, coughing up blood. GI: Described in detail in HPI.    GU : Denies urinary burning, blood in urine, urinary frequency, urinary hesitancy, nocturnal urination, and urinary incontinence. MS: Denies joint pain or swelling.  Denies muscle weakness, cramps, atrophy.  Derm: Denies rash, itching, oral ulcerations, hives, unhealing ulcers.  Psych: Denies depression, anxiety, memory loss, suicidal ideation, hallucinations,  and confusion. Heme: Denies bruising, bleeding, and enlarged lymph nodes. Neuro:  Denies any headaches, dizziness, paresthesias; left lower extremity weakness post stroke Endo:  Denies any problems with DM, thyroid, adrenal function.  Physical Exam: Vital signs in last 24 hours: Temp:  [97.2 F (36.2 C)-98.6 F (37 C)] 98.6 F (37 C) (05/26 0600) Pulse Rate:  [74-102] 87  (05/26 1013) Resp:  [18] 18  (05/26 0600) BP: (96-139)/(52-71) 132/71 mmHg (05/26 1013) SpO2:  [94 %-100 %] 95 % (05/26 0600) Weight:  [72.893 kg (160 lb 11.2 oz)-73.4 kg (161 lb 13.1 oz)] 73.4 kg (161 lb 13.1 oz) (05/26 0600) Last BM Date: 06/20/11 General:   Alert,  Well-developed, well-nourished, pleasant and cooperative in NAD Head:  Normocephalic and atraumatic. Eyes:  Sclera clear, no icterus.   Conjunctiva slightly pale. Ears:  Normal auditory acuity. Nose:  No deformity, discharge,  or  lesions. Mouth:  No deformity or lesions.  Oropharynx pink & moist. Neck:  Supple; no masses or thyromegaly. Lungs:  Clear throughout to auscultation.   No wheezes, crackles, or rhonchi. No acute distress. Heart:  Irregularly regular rhythm, normal rate; no clicks, rubs,  or gallops; III/VI SEM L/RUSB Abdomen:  Soft, nontender and nondistended. No masses, hepatosplenomegaly or hernias noted. Normal bowel sounds, without guarding, and without rebound.     Msk:  Symmetrical without gross deformities. Normal posture. Pulses:  Normal pulses noted. Extremities:  Without clubbing or edema. Neurologic:  Alert and  oriented x4;  Left lower extremity weakness, otherwise diffusely weak but non-focal Skin:  Occasional ecchymoses. Psych:  Alert and cooperative. Normal mood and affect.  Lab Results:  Basename 06/21/11 0500 06/20/11 2102 06/20/11 1635  WBC 5.7 -- 7.8  HGB 7.9* 8.8* 9.4*  HCT 24.7* 26.9* 29.1*  PLT 248 -- 281   BMET  Basename 06/21/11 0500 06/20/11 1635 06/19/11 2000  NA 136 139 143  K 4.4 3.7 4.2  CL 104 102 103  CO2 27 29 31   GLUCOSE 123* 129* 94  BUN 25* 28* 31*  CREATININE 0.86 0.93 1.06  CALCIUM 8.4 9.4 9.3   LFT  Basename 06/20/11 1635  PROT 6.2  ALBUMIN 3.2*  AST 16  ALT 12  ALKPHOS 67  BILITOT 0.3  BILIDIR --  IBILI --   PT/INR  Basename 06/21/11 0746 06/20/11 1635  LABPROT 35.7* 33.3*  INR 3.51* 3.21*   Impression:  1.  Melena in setting of coagulopathy. 2.  Acute blood loss anemia. 3.  Chronic need for anticoagulation.  Plan:  1.  Protonix. 2.  Serial CBCs with transfusions as needed. 3.  EGD once INR < 2.  If EGD is negative, as is likely, will need capsule endoscopy.   LOS: 1 day   Eero Dini M  06/21/2011, 11:50 AM

## 2011-06-21 NOTE — Progress Notes (Signed)
Family Medicine Teaching Service Attending Note  I interviewed and examined patient [name] and reviewed their tests and x-rays.  I discussed with Dr. Madolyn Frieze and [place]  and reviewed their note for today.  I agree with their assessment and plan.     Additionally  Restart diuretic given blood transfusion Follow symptoms and INR and hgb

## 2011-06-21 NOTE — Progress Notes (Signed)
Pt had a lg bm- burgundy in color

## 2011-06-21 NOTE — Progress Notes (Signed)
Subjective: No overnight events.  Patient continues to deny chest pain/palpitations, lightheadedness/dizziness. She did have one small bowel movement with smear of blood last night.    Objective: Vital signs in last 24 hours: Temp:  [97.2 F (36.2 C)-98.6 F (37 C)] 98.6 F (37 C) (05/26 0600) Pulse Rate:  [74-102] 88  (05/26 0600) Resp:  [16-18] 18  (05/26 0600) BP: (95-139)/(52-70) 103/60 mmHg (05/26 0600) SpO2:  [94 %-100 %] 95 % (05/26 0600) Weight:  [160 lb 11.2 oz (72.893 kg)-161 lb 13.1 oz (73.4 kg)] 161 lb 13.1 oz (73.4 kg) (05/26 0600)  Intake/Output from previous day: 05/25 0701 - 05/26 0700 In: 2415 [P.O.:270; I.V.:1145] Out: 525 [Urine:525]   Physical exam: GEN: NAD PSYCH: pleasant, engaged, appropriate to questions; alert and oriented CV: irregularly irregular, 2-3/6 systolic murmur throughout  Pulm: NI WOB Neuro: grossly intact; moves all extremities   Gait: able to ambulate without unsteadiness/dizziness/lightheadedness from bed to bathroom   Results for orders placed during the hospital encounter of 06/20/11 (from the past 24 hour(s))  CBC     Status: Abnormal   Collection Time   06/20/11  4:35 PM      Component Value Range   WBC 7.8  4.0 - 10.5 (K/uL)   RBC 3.38 (*) 3.87 - 5.11 (MIL/uL)   Hemoglobin 9.4 (*) 12.0 - 15.0 (g/dL)   HCT 78.2 (*) 95.6 - 46.0 (%)   MCV 86.1  78.0 - 100.0 (fL)   MCH 27.8  26.0 - 34.0 (pg)   MCHC 32.3  30.0 - 36.0 (g/dL)   RDW 21.3  08.6 - 57.8 (%)   Platelets 281  150 - 400 (K/uL)  PROTIME-INR     Status: Abnormal   Collection Time   06/20/11  4:35 PM      Component Value Range   Prothrombin Time 33.3 (*) 11.6 - 15.2 (seconds)   INR 3.21 (*) 0.00 - 1.49   COMPREHENSIVE METABOLIC PANEL     Status: Abnormal   Collection Time   06/20/11  4:35 PM      Component Value Range   Sodium 139  135 - 145 (mEq/L)   Potassium 3.7  3.5 - 5.1 (mEq/L)   Chloride 102  96 - 112 (mEq/L)   CO2 29  19 - 32 (mEq/L)   Glucose, Bld 129 (*)  70 - 99 (mg/dL)   BUN 28 (*) 6 - 23 (mg/dL)   Creatinine, Ser 4.69  0.50 - 1.10 (mg/dL)   Calcium 9.4  8.4 - 62.9 (mg/dL)   Total Protein 6.2  6.0 - 8.3 (g/dL)   Albumin 3.2 (*) 3.5 - 5.2 (g/dL)   AST 16  0 - 37 (U/L)   ALT 12  0 - 35 (U/L)   Alkaline Phosphatase 67  39 - 117 (U/L)   Total Bilirubin 0.3  0.3 - 1.2 (mg/dL)   GFR calc non Af Amer 54 (*) >90 (mL/min)   GFR calc Af Amer 63 (*) >90 (mL/min)  HEMOGLOBIN AND HEMATOCRIT, BLOOD     Status: Abnormal   Collection Time   06/20/11  9:02 PM      Component Value Range   Hemoglobin 8.8 (*) 12.0 - 15.0 (g/dL)   HCT 52.8 (*) 41.3 - 46.0 (%)  CBC     Status: Abnormal   Collection Time   06/21/11  5:00 AM      Component Value Range   WBC 5.7  4.0 - 10.5 (K/uL)   RBC 2.86 (*) 3.87 -  5.11 (MIL/uL)   Hemoglobin 7.9 (*) 12.0 - 15.0 (g/dL)   HCT 28.4 (*) 13.2 - 46.0 (%)   MCV 86.4  78.0 - 100.0 (fL)   MCH 27.6  26.0 - 34.0 (pg)   MCHC 32.0  30.0 - 36.0 (g/dL)   RDW 44.0  10.2 - 72.5 (%)   Platelets 248  150 - 400 (K/uL)  BASIC METABOLIC PANEL     Status: Abnormal   Collection Time   06/21/11  5:00 AM      Component Value Range   Sodium 136  135 - 145 (mEq/L)   Potassium 4.4  3.5 - 5.1 (mEq/L)   Chloride 104  96 - 112 (mEq/L)   CO2 27  19 - 32 (mEq/L)   Glucose, Bld 123 (*) 70 - 99 (mg/dL)   BUN 25 (*) 6 - 23 (mg/dL)   Creatinine, Ser 3.66  0.50 - 1.10 (mg/dL)   Calcium 8.4  8.4 - 44.0 (mg/dL)   GFR calc non Af Amer 59 (*) >90 (mL/min)   GFR calc Af Amer 69 (*) >90 (mL/min)  PROTIME-INR     Status: Abnormal   Collection Time   06/21/11  7:46 AM      Component Value Range   Prothrombin Time 35.7 (*) 11.6 - 15.2 (seconds)   INR 3.51 (*) 0.00 - 1.49     Studies/Results: Dg Chest 2 View  05/25/2011  *RADIOLOGY REPORT*  IMPRESSION:  1.  Increased size of cardiac silhouette since previous study, which may be due to worsening cardiomegaly or pericardial effusion. 2.  Increased bilateral perihilar atelectasis versus scarring. 3.  No  evidence of pulmonary edema or consolidation.     Scheduled Meds:   . digoxin  0.125 mg Oral Daily  . pantoprazole  40 mg Oral Q1200  . sodium chloride  1,000 mL Intravenous Once  . sodium chloride  3 mL Intravenous Q12H  . torsemide  20 mg Oral Daily  . verapamil  120 mg Oral BID  . DISCONTD: digoxin  0.125 mg Oral Daily  . DISCONTD: verapamil  240 mg Oral QHS   Continuous Infusions:   . dextrose 5 % and 0.45 % NaCl with KCl 20 mEq/L 150 mL/hr at 06/21/11 0810   PRN Meds:acetaminophen, acetaminophen, ondansetron (ZOFRAN) IV, ondansetron, oxyCODONE, traZODone  Assessment/Plan: [name] is a 78 y.o. year old female with atrial fibrillation and a history of GI bleed 04/2011 who presents with melena, anemia and a supratherapeutic INR on 05/25.   GI/HEME Anemia, GIB, supratherapeutic INR  She had an EGD 04/2011 due to tarry stools. No abnormalities were noted. She reports history of H. pylori in the past; currently denies gastritis symptoms.  Hgb continues to drop and is now 8.8. Patient remains asymptomatic.  -Awaiting consultation with Dr. Dulce Sellar. Anticipate he will see this morning. -Will re-check Hgb around noon. Will transfuse if < 8.0 or if patient symptomatic or vital sign compromise.  -Protonix 40 bid -Holding coumadin. 4.0-->3.21-->3.51 this morning. Will give dose of Vitamin K 2.5 mg x 1 now. Re-check in AM.   CV History of atrial fibrillation -May no longer be coumadin candidate due to recent episodes of GIB. However, she is at high risk for thrombus formation.  -Continue home digoxin. -Consider consultation with [facility] (her primary cardiologist).  History of HTN -Continue home verapamil. Hold torsemide.   PSYCH/NEURO History of chronic MSK back pain -Continue home oxycodone prn. Heating pad.  History of CVA--recent right frontal infarct 1  month ago, minimal residual deficits  FEN/GI -NPO in case of endoscopy/colonoscopy  -IVF: D5 1/2NS c KCl  @ 150  PPX -No DVT PPx 2/2 supratherapeutic INR  Dispo -Pending stable Hgb, normalized INR or INR appropriate on warfarin    Code: limited, no compression or intubation; electrical cardioversion and meds are OK     LOS: 1 day   [place] PARK, [name]

## 2011-06-21 NOTE — H&P (Signed)
Family Medicine Teaching Service Attending Note  I interviewed and examined patient [name] and reviewed their tests and x-rays.  I discussed with Dr. Madolyn Frieze and reviewed their note for today.  I agree with their assessment and plan.     Additionally  Feels well no lightheadness or chest pain Hgb decreasing and INR increasing Transfuse and vit k aiming for partial reversal Endoscopy as per gi TC change from warfarin to xarelto for long term anticoagulation

## 2011-06-22 ENCOUNTER — Encounter (HOSPITAL_COMMUNITY)
Admission: AD | Disposition: A | Discharge status: Home / Self Care | Payer: Self-pay | Source: Ambulatory Visit | Attending: Family Medicine

## 2011-06-22 ENCOUNTER — Encounter (HOSPITAL_COMMUNITY): Payer: Self-pay

## 2011-06-22 ENCOUNTER — Encounter: Payer: Self-pay | Admitting: Family Medicine

## 2011-06-22 HISTORY — PX: ESOPHAGOGASTRODUODENOSCOPY: SHX5428

## 2011-06-22 LAB — PROTIME-INR: INR: 1.44 (ref 0.00–1.49)

## 2011-06-22 LAB — BASIC METABOLIC PANEL
BUN: 17 mg/dL (ref 6–23)
CO2: 28 mEq/L (ref 19–32)
Glucose, Bld: 101 mg/dL — ABNORMAL HIGH (ref 70–99)
Potassium: 3.7 mEq/L (ref 3.5–5.1)
Sodium: 140 mEq/L (ref 135–145)

## 2011-06-22 LAB — TYPE AND SCREEN
ABO/RH(D): AB POS
Unit division: 0

## 2011-06-22 LAB — PREPARE FRESH FROZEN PLASMA: Unit division: 0

## 2011-06-22 LAB — CBC
Hemoglobin: 9.9 g/dL — ABNORMAL LOW (ref 12.0–15.0)
Platelets: 243 10*3/uL (ref 150–400)
RBC: 3.56 MIL/uL — ABNORMAL LOW (ref 3.87–5.11)
WBC: 6.1 10*3/uL (ref 4.0–10.5)

## 2011-06-22 LAB — HEMOGLOBIN AND HEMATOCRIT, BLOOD
HCT: 29.5 % — ABNORMAL LOW (ref 36.0–46.0)
Hemoglobin: 9.9 g/dL — ABNORMAL LOW (ref 12.0–15.0)

## 2011-06-22 SURGERY — EGD (ESOPHAGOGASTRODUODENOSCOPY)
Anesthesia: Moderate Sedation

## 2011-06-22 MED ORDER — FENTANYL CITRATE 0.05 MG/ML IJ SOLN
INTRAMUSCULAR | Status: AC
  Filled 2011-06-22: qty 2

## 2011-06-22 MED ORDER — MIDAZOLAM HCL 10 MG/2ML IJ SOLN
INTRAMUSCULAR | Status: AC
  Filled 2011-06-22: qty 2

## 2011-06-22 MED ORDER — FENTANYL CITRATE 0.05 MG/ML IJ SOLN
INTRAMUSCULAR | Status: DC | PRN
  Administered 2011-06-22: 25 ug via INTRAVENOUS

## 2011-06-22 MED ORDER — DEXTROSE-NACL 5-0.45 % IV SOLN
INTRAVENOUS | Status: DC
  Administered 2011-06-22 (×2): via INTRAVENOUS

## 2011-06-22 MED ORDER — POLYETHYLENE GLYCOL 3350 17 G PO PACK
17.0000 g | PACK | Freq: Two times a day (BID) | ORAL | Status: DC | PRN
  Administered 2011-06-22: 17 g via ORAL
  Filled 2011-06-22: qty 1

## 2011-06-22 MED ORDER — MIDAZOLAM HCL 10 MG/2ML IJ SOLN
INTRAMUSCULAR | Status: DC | PRN
  Administered 2011-06-22: 2 mg via INTRAVENOUS

## 2011-06-22 NOTE — Interval H&P Note (Signed)
History and Physical Interval Note:  06/22/2011 1:06 PM  [name]  has presented today for surgery, with the diagnosis of GI BLEED  The various methods of treatment have been discussed with the patient and family. After consideration of risks, benefits and other options for treatment, the patient has consented to  Procedure(s) (LRB): ESOPHAGOGASTRODUODENOSCOPY (EGD) (N/A) as a surgical intervention .  The patients' history has been reviewed, patient examined, no change in status, stable for surgery.  I have reviewed the patients' chart and labs.  Questions were answered to the patient's satisfaction.     Freddy Jaksch

## 2011-06-22 NOTE — Op Note (Signed)
[facility] [address] [place], [place]  [address]  ENDOSCOPY PROCEDURE REPORT  PATIENT:  [name], [name]  MR#:  [id number] BIRTHDATE:  26-Feb-1923, 88 yrs. old  GENDER:  female ENDOSCOPIST:  Willis Modena, MD Referred by:  Estill Batten. Deirdre Priest, M.D. PROCEDURE DATE:  06/22/2011 PROCEDURE:  EGD, diagnostic 43235 ASA CLASS:  Class III INDICATIONS:  melena, anticoagulation MEDICATIONS:   Fentanyl 25 mcg IV, Versed 2 mg IV, Cetacaine spray x 2 DESCRIPTION OF PROCEDURE:   After the risks benefits and alternatives of the procedure were thoroughly explained, informed consent was obtained.  The EG-[id number] ([id number]) endoscope was introduced through the mouth and advanced to the second portion of the duodenum, without limitations.  The instrument was slowly withdrawn as the mucosa was fully examined.  <<PROCEDUREIMAGES>>  FINDINGS:  Normal esophagus.  Small sub-cm submucosal-appearing nodule with normal overlying mucosa (no ulceration, no bleeding) in cardia along lesser curvature. Mild antral gastritis. Otherwise normal stomach, pylorus and duodenum to the second portion.  No old or fresh blood identified.  ENDOSCOPIC IMPRESSION:    1.  Small gastric nodule, highly likely incidental finding. 2.  Otherwise normal endoscopy; no source of melena identified.  RECOMMENDATIONS:      1.  Watch for potential complications of procedure. 2.  Continue Protonix. 3.  Capsule endoscopy tomorrow.  REPEAT EXAM:  No  ______________________________ Willis Modena  CC:  n. eSIGNEDWillis Modena at 06/22/2011 01:45 PM  [name], [phone]

## 2011-06-22 NOTE — Progress Notes (Addendum)
Subjective: No acute events overnight.  Patient complains of lower back spasms - last 2-3 seconds and occur every 2-3 min.  She just received pain medication and is waiting for it to kick in.  Still having bloody stools, but denies any dizziness/fatigue or abdominal pain.  Objective: Vital signs in last 24 hours: Temp:  [97 F (36.1 C)-98.3 F (36.8 C)] 97.7 F (36.5 C) (05/27 0707) Pulse Rate:  [64-104] 83  (05/27 0707) Resp:  [16-18] 18  (05/27 0707) BP: (96-140)/(49-80) 111/65 mmHg (05/27 0707) SpO2:  [91 %-100 %] 100 % (05/27 0707) Weight:  [159 lb 13.3 oz (72.5 kg)] 159 lb 13.3 oz (72.5 kg) (05/27 0707) Weight change:  Last BM Date: 06/21/11  Intake/Output from previous day: 05/26 0701 - 05/27 0700 In: 1510 [P.O.:680; I.V.:250; Blood:580] Out: 3251 [Urine:3250; Stool:1] Intake/Output this shift:   PHYSICAL EXAM: General appearance: alert, cooperative and sitting up in bed. Resp: clear to auscultation bilaterally Cardio: irregularly irregular, 2/6 SEM appreciated. GI: soft, non-tender; bowel sounds normal; no masses,  no organomegaly Extremities: extremities normal, atraumatic, no cyanosis or edema Skin: Skin color, texture, turgor normal. No rashes or lesions, no pallor  Lab Results:  Basename 06/22/11 0535 06/21/11 1800 06/21/11 0500 06/20/11 1635  WBC -- -- 5.7 7.8  HGB 9.9* 10.4* -- --  HCT 29.5* 31.3* -- --  PLT -- -- 248 281   BMET  Basename 06/22/11 0535 06/21/11 0500  NA 140 136  K 3.7 4.4  CL 105 104  CO2 28 27  GLUCOSE 101* 123*  BUN 17 25*  CREATININE 0.97 0.86  CALCIUM 8.7 8.4    Studies/Results: No results found.  Medications: I have reviewed the patient's current medications.  Assessment/Plan: AKIYAH EPPOLITO is a 78 y.o. year old female with atrial fibrillation and a history of GI bleed 04/2011 who presents with melena, anemia and a supratherapeutic INR on 05/25.   # Anemia, GIB, supratherapeutic INR:   She had an EGD 04/2011 due to  tarry stools. No abnormalities were noted. She reports history of H. pylori in the past; currently denies gastritis symptoms.  Hgb dropped to 7.9 and patient received 2 units PRBC on 06/21/11.   - Awaiting recommendations for EGD per Dr. Dulce Sellar.  Patient has been NPO since midnight. - Recheck Hgb at 1600.  Will transfuse if < 8.0 or if patient symptomatic or vital sign compromise.   -Protonix 40 BID - Holding coumadin. 4.0-->3.21-->3.5-->1.6-->1.4 this morning.   # History of atrial fibrillation: currently in A. Fib.  May no longer be coumadin candidate due to recent episodes of GIB.  However, she is at high risk for thrombus formation.  - Continue home digoxin. - Hold coumadin. - Consider consultation with [facility] (her primary cardiologist).   # History of Hypertension: BP normal off medications. - Continue to hold home verapamil and torsemide.   # History of chronic MSK back pain - Continue home oxycodone prn. Heating pad.  - Consider adding Flexeril if spasms continue.  # History of CVA--recent right frontal infarct 1 month ago, minimal residual deficits - Monitor BP closely.  FEN/GI -NPO in case of endoscopy/colonoscopy  -IVF: D5 1/2NS @ 75 cc/hr PPX:  SCD Code: limited, no compression or intubation; electrical cardioversion and meds are OK. Dispo: Pending GI recommendations/further workup and clinical improvement.    LOS: 2 days   DE LA CRUZ,Etha Stambaugh 06/22/2011, 9:41 AM

## 2011-06-22 NOTE — Progress Notes (Signed)
  Subjective:    Patient ID: [name], female    DOB: 09-06-1923, 78 y.o.   MRN: [id number]  HPI Patient presents complaining of LE muscle spasms that have not responded to Robaxin.  Patient states is not on diuretic therapy No history of lumbar radiculopathy No LE edema or LE trauma Healthy balanced diet per patient  Patient has a history of elevated BS and she would like this rechecked.   Review of Systems     Objective:   Physical Exam  Constitutional: She appears well-developed.  Neck: Neck supple. No thyromegaly present.  Cardiovascular: Normal rate, regular rhythm and normal heart sounds.   Pulmonary/Chest: Effort normal and breath sounds normal.  Musculoskeletal: Normal range of motion. She exhibits no tenderness.       No LE edema or cords  Neurological: She is alert.          Assessment & Plan:   1. Elevated blood sugar  POCT glycosylated hemoglobin (Hb A1C)  2. Muscle spasm  cyclobenzaprine (FLEXERIL) 10 MG tablet, Comprehensive metabolic panel    Anticipatory guidance

## 2011-06-23 ENCOUNTER — Encounter (HOSPITAL_COMMUNITY): Payer: Self-pay | Admitting: Gastroenterology

## 2011-06-23 ENCOUNTER — Encounter (HOSPITAL_COMMUNITY)
Admission: AD | Disposition: A | Discharge status: Home / Self Care | Payer: Self-pay | Source: Ambulatory Visit | Attending: Family Medicine

## 2011-06-23 HISTORY — PX: GIVENS CAPSULE STUDY: SHX5432

## 2011-06-23 LAB — CBC
HCT: 34.7 % — ABNORMAL LOW (ref 36.0–46.0)
Hemoglobin: 11.2 g/dL — ABNORMAL LOW (ref 12.0–15.0)
MCH: 27.8 pg (ref 26.0–34.0)
Platelets: 260 10*3/uL (ref 150–400)
RBC: 3.63 MIL/uL — ABNORMAL LOW (ref 3.87–5.11)
RBC: 3.96 MIL/uL (ref 3.87–5.11)
RDW: 13.7 % (ref 11.5–15.5)
WBC: 5.8 10*3/uL (ref 4.0–10.5)
WBC: 6.2 10*3/uL (ref 4.0–10.5)

## 2011-06-23 LAB — PROTIME-INR
INR: 1.22 (ref 0.00–1.49)
Prothrombin Time: 15.7 seconds — ABNORMAL HIGH (ref 11.6–15.2)

## 2011-06-23 SURGERY — IMAGING PROCEDURE, GI TRACT, INTRALUMINAL, VIA CAPSULE
Anesthesia: LOCAL

## 2011-06-23 SURGICAL SUPPLY — 1 items: TOWEL COTTON PACK 4EA (MISCELLANEOUS) ×4 IMPLANT

## 2011-06-23 NOTE — Progress Notes (Signed)
Family Medicine Teaching Service [facility] Progress Note  Patient name: [name] Medical record number: [id number] Date of birth: 1923/02/25 Age: 78 y.o. Gender: female    LOS: 3 days   Primary Care Provider: Lucilla Edin, MD, MD  Overnight Events:  NAEO. Feeling well this morning. No orthostatic complaints. Feeling hungry  Objective: Vital signs in last 24 hours: Temp:  [97.3 F (36.3 C)-97.6 F (36.4 C)] 97.3 F (36.3 C) (05/28 0536) Pulse Rate:  [63-66] 66  (05/28 0536) Resp:  [16-25] 18  (05/28 0536) BP: (98-125)/(53-79) 121/57 mmHg (05/28 0536) SpO2:  [89 %-99 %] 96 % (05/28 0536) Weight:  [157 lb 13.6 oz (71.6 kg)] 157 lb 13.6 oz (71.6 kg) (05/28 0536)  Wt Readings from Last 3 Encounters:  06/23/11 157 lb 13.6 oz (71.6 kg)  06/23/11 157 lb 13.6 oz (71.6 kg)  06/23/11 157 lb 13.6 oz (71.6 kg)     Current Facility-Administered Medications  Medication Dose Route Frequency Provider Last Rate Last Dose  . acetaminophen (TYLENOL) tablet 650 mg  650 mg Oral Q6H PRN Garnetta Buddy, MD   650 mg at 06/21/11 2216   Or  . acetaminophen (TYLENOL) suppository 650 mg  650 mg Rectal Q6H PRN Garnetta Buddy, MD      . dextrose 5 %-0.45 % sodium chloride infusion   Intravenous Continuous Barnabas Lister, MD 75 mL/hr at 06/22/11 2048    . digoxin (LANOXIN) tablet 0.125 mg  0.125 mg Oral Daily Carney Living, MD   0.125 mg at 06/22/11 1024  . ondansetron (ZOFRAN) tablet 4 mg  4 mg Oral Q6H PRN Garnetta Buddy, MD       Or  . ondansetron [facility]) injection 4 mg  4 mg Intravenous Q6H PRN Garnetta Buddy, MD      . oxyCODONE (Oxy IR/ROXICODONE) immediate release tablet 5 mg  5 mg Oral Q4H PRN Garnetta Buddy, MD   5 mg at 06/23/11 0736  . pantoprazole (PROTONIX) EC tablet 40 mg  40 mg Oral Q1200 Garnetta Buddy, MD   40 mg at 06/22/11 1131  . polyethylene glycol (MIRALAX / GLYCOLAX) packet 17 g  17 g Oral BID PRN Barnabas Lister, MD   17 g at 06/22/11 1708   . sodium chloride 0.9 % injection 3 mL  3 mL Intravenous Q12H Garnetta Buddy, MD   3 mL at 06/22/11 2250  . torsemide (DEMADEX) tablet 20 mg  20 mg Oral Daily Jacquelyn A McGill, MD   20 mg at 06/22/11 1024  . traZODone (DESYREL) tablet 50 mg  50 mg Oral QHS PRN Garnetta Buddy, MD      . verapamil (CALAN-SR) CR tablet 120 mg  120 mg Oral BID Garnetta Buddy, MD   120 mg at 06/22/11 2249  . DISCONTD: fentaNYL (SUBLIMAZE) injection    PRN Willis Modena, MD   25 mcg at 06/22/11 1319  . DISCONTD: midazolam (VERSED) injection    PRN Willis Modena, MD   2 mg at 06/22/11 1319     PE: Gen: NAD HEENT: MMM CV: irregularly irregular. 2+ pulses in extremities YQM:VHQI ONG:EXBM non-tender. NABS  Labs/Studies: Upper endoscopy yesterday w/o evidence of bleeding.   Lab Results  Component Value Date   WBC 5.8 06/23/2011   HGB 10.1* 06/23/2011   HCT 31.4* 06/23/2011   MCV 86.5 06/23/2011   PLT 260 06/23/2011   INR/Prothrombin Time: 1.22/15.7   Assessment/Plan:  [name] is a 78 y.o. year old female with atrial fibrillation and a history of GI bleed 04/2011 who presents with melena, anemia and a supratherapeutic INR on 05/25.   # Anemia, GIB, supratherapeutic INR: Unsure of GI source of bleed. Stable Hgb which is Improved today to 10.1. Transfused 2 units PRBC on 06/21/11. GI following.  - f/U Capsule study today and GI recs - CBC in AM - continue Protonix 40 BID  - Holding coumadin. 4.0-->3.21-->3.5-->1.6-->1.4-->1.22  this morning.   # History of atrial fibrillation: currently in A. Fib. May no longer be coumadin candidate due to recent episodes of GIB. However, she is at high risk for thrombus formation. - Will consider Xeralto  - Continue home digoxin.  - Hold coumadin.  - Consider consultation with [facility] (her primary cardiologist).   # History of Hypertension: BP normal off medications.  - Continue to hold home verapamil and torsemide, until  warranted  # History of chronic MSK back pain: Well controlled - Continue home oxycodone prn. Heating pad.  - Consider adding Flexeril if spasms continue.   # History of CVA--recent right frontal infarct 1 month ago, minimal residual deficits  - Monitor BP closely.   FEN/GI: Continues to be NPO  -Continue IVF: D5 1/2NS @ 75 cc/hr   PPX: SCD   Code: limited, no compression or intubation; electrical cardioversion and meds are OK.   Dispo: Pending GI recommendations/further workup and clinical improvement.   LOS 3  Signed: Shelly Flatten, MD Family Medicine Resident PGY-1 [phone] 06/23/2011 8:22 AM

## 2011-06-23 NOTE — Progress Notes (Signed)
Seen and examined.  Agree with Dr. Konrad Dolores.  Bleeding seems to have stopped.  Feels fine.  Anticipate DC tomorrow.  The big question is DC on which anticoagulant.

## 2011-06-23 NOTE — Progress Notes (Signed)
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

## 2011-06-23 NOTE — Progress Notes (Signed)
Utilization review completed.  

## 2011-06-24 ENCOUNTER — Ambulatory Visit: Payer: Medicare Other | Admitting: Physical Therapy

## 2011-06-24 ENCOUNTER — Encounter (HOSPITAL_COMMUNITY): Payer: Self-pay | Admitting: Gastroenterology

## 2011-06-24 LAB — PROTIME-INR: INR: 1.13 (ref 0.00–1.49)

## 2011-06-24 LAB — CBC
HCT: 30.9 % — ABNORMAL LOW (ref 36.0–46.0)
Platelets: 267 10*3/uL (ref 150–400)
RBC: 3.57 MIL/uL — ABNORMAL LOW (ref 3.87–5.11)
RDW: 13.8 % (ref 11.5–15.5)
WBC: 6.7 10*3/uL (ref 4.0–10.5)

## 2011-06-24 MED ORDER — IOHEXOL 300 MG/ML  SOLN
20.0000 mL | INTRAMUSCULAR | Status: AC
  Administered 2011-06-24: 20 mL via ORAL

## 2011-06-24 NOTE — Progress Notes (Signed)
Discussed in rounds.  Agree with Dr. Satira Sark management.

## 2011-06-24 NOTE — Progress Notes (Signed)
Procedure capsule endoscopy Please see scanned report for detail Findings: A little dark material in mid small bowel but otherwise normal capsule endoscopy and no signs of active bleeding and normal colored stool in the colon Plan: I would get a one time CT of abdomen and pelvis with contrast just to be sure and reevaluate blood thinner needs at home and if CT okay she could go home with outpatient followup and consider outpatient repeat colonoscopy with lasering AVM in the cecum if nothing else is seen

## 2011-06-24 NOTE — Progress Notes (Signed)
Discussed discharge instructions with pt including medications to take at home, how and when to call the dr, and follow up appt. Pt verbalized understanding and denied any questions. IV dc'd. No prescriptions given.

## 2011-06-24 NOTE — Progress Notes (Signed)
[name] 10:04 AM  Subjective: Patient doing well without GI complaint and her primary complaint is low back pain  Objective: Vital signs stable afebrile, abdomen soft nontender hemoglobin stable from yesterday morning capsule endoscopy okay please see report  Assessment: Subacute GI bleeding questionable etiology  Plan: Will get a CAT scan today and if okay she can go home from our standpoint and I have given her a followup with me in 3 weeks to recheck symptoms CBC and guaiac and decide if repeat colonoscopy and lasering her AVM is needed in the meantime reevaluate her blood thinner requirements  [facility]

## 2011-06-24 NOTE — Discharge Instructions (Signed)
You were admitted due to a severe bleed from somewhere in your abdomen. The bleeding has stopped and you are stable to go home. Please stop taking you warfarin. You will likely do better with Xarelto in the future for your blood thinning. Please talk to your primary care physician about this. We recommend you wait 1-2 weeks before starting blood thinning medications. Please follow up with Dr. Ewing Schlein as instructed in 3 weeks.   Have a great day.

## 2011-06-24 NOTE — Progress Notes (Signed)
Family Medicine Teaching Service [facility] Progress Note  Patient name: [name] Medical record number: [id number] Date of birth: March 26, 1923 Age: 78 y.o. Gender: female    LOS: 4 days   Primary Care Provider: Lucilla Edin, MD, MD  Overnight Events:  No complaints this am. Tolerating regular diet. One BM this am that was non-bloody. No lightheadedness or syncope  Objective: Vital signs in last 24 hours: Temp:  [97.3 F (36.3 C)-98.8 F (37.1 C)] 98.5 F (36.9 C) (05/29 0425) Pulse Rate:  [63-73] 73  (05/29 0425) Resp:  [18] 18  (05/29 0425) BP: (108-128)/(50-79) 108/50 mmHg (05/29 0425) SpO2:  [92 %-96 %] 92 % (05/29 0425) Weight:  [158 lb 8.2 oz (71.9 kg)] 158 lb 8.2 oz (71.9 kg) (05/29 0425)  Wt Readings from Last 3 Encounters:  06/24/11 158 lb 8.2 oz (71.9 kg)  06/24/11 158 lb 8.2 oz (71.9 kg)  06/24/11 158 lb 8.2 oz (71.9 kg)     Current Facility-Administered Medications  Medication Dose Route Frequency Provider Last Rate Last Dose  . acetaminophen (TYLENOL) tablet 650 mg  650 mg Oral Q6H PRN Garnetta Buddy, MD   650 mg at 06/21/11 2216   Or  . acetaminophen (TYLENOL) suppository 650 mg  650 mg Rectal Q6H PRN Garnetta Buddy, MD      . digoxin Margit Banda) tablet 0.125 mg  0.125 mg Oral Daily Carney Living, MD   0.125 mg at 06/23/11 1201  . ondansetron (ZOFRAN) tablet 4 mg  4 mg Oral Q6H PRN Garnetta Buddy, MD       Or  . ondansetron [facility]) injection 4 mg  4 mg Intravenous Q6H PRN Garnetta Buddy, MD      . oxyCODONE (Oxy IR/ROXICODONE) immediate release tablet 5 mg  5 mg Oral Q4H PRN Garnetta Buddy, MD   5 mg at 06/23/11 0736  . pantoprazole (PROTONIX) EC tablet 40 mg  40 mg Oral Q1200 Garnetta Buddy, MD   40 mg at 06/23/11 1200  . polyethylene glycol (MIRALAX / GLYCOLAX) packet 17 g  17 g Oral BID PRN Barnabas Lister, MD   17 g at 06/22/11 1708  . sodium chloride 0.9 % injection 3 mL  3 mL Intravenous Q12H Garnetta Buddy, MD    3 mL at 06/23/11 2128  . torsemide (DEMADEX) tablet 20 mg  20 mg Oral Daily Jacquelyn A McGill, MD   20 mg at 06/23/11 1200  . traZODone (DESYREL) tablet 50 mg  50 mg Oral QHS PRN Garnetta Buddy, MD      . verapamil (CALAN-SR) CR tablet 120 mg  120 mg Oral BID Garnetta Buddy, MD   120 mg at 06/23/11 2127  . DISCONTD: dextrose 5 %-0.45 % sodium chloride infusion   Intravenous Continuous Barnabas Lister, MD 75 mL/hr at 06/22/11 2048       PE: Gen: NAD  HEENT: MMM  CV: irregularly irregular. 2+ pulses in extremities  NWG:NFAO  ZHY:QMVH non-tender. NABS   Labs/Studies: Lab Results  Component Value Date   WBC 6.7 06/24/2011   HGB 10.0* 06/24/2011   HCT 30.9* 06/24/2011   MCV 86.6 06/24/2011   PLT 267 06/24/2011       Assessment/Plan: [name] is a 78 y.o. year old female with atrial fibrillation and a history of GI bleed 04/2011 who presents with melena, anemia and a supratherapeutic INR on 05/25.   # Anemia, GIB, supratherapeutic INR: Unsure  of GI source of bleed. Possibly from Cecal AVM. Stable Hgb at 10.1 today. Transfused 2 units PRBC on 06/21/11. GI following.  - f/U Capsule study today and GI recs  - CBC BID until DC - continue Protonix 40 BID  - Holding coumadin. 4.0-->3.21-->3.5-->1.6-->1.4-->1.22-->1.13 this morning.  - Recommend Xarelto to start in 1 wk to avoid further current bleeding and as pt w/ previous bleeds due to being supratherapeutic  # History of atrial fibrillation:  currently in A. Fib. May no longer be coumadin candidate due to recent episodes of GIB. However, she is at high risk for thrombus formation.  - Will consider Xeralto, as above - Continue home digoxin.  - Hold coumadin.  - Consider consultation with [facility] (her primary cardiologist).   # History of Hypertension: BP normal off medications.  - Continue to hold home verapamil and torsemide, until warranted   # History of chronic MSK back pain: Well controlled  -  Continue home oxycodone prn. Heating pad.  - Consider adding Flexeril if spasms continue.   # History of CVA--recent right frontal infarct 1 month ago, minimal residual deficits. BP well controlled currently - Monitor BP closely.   FEN/GI: Tolerating heart healthy diet. SLIV  PPX: SCD   Code: limited, no compression or intubation; electrical cardioversion and meds are OK.   Dispo: Pending GI recommendations/further workup and clinical improvement. Likely DC today  LOS 4  Signed: Shelly Flatten, MD Family Medicine Resident PGY-1 [phone] 06/24/2011 8:20 AM

## 2011-06-25 ENCOUNTER — Other Ambulatory Visit: Payer: Self-pay | Admitting: Gastroenterology

## 2011-06-26 ENCOUNTER — Ambulatory Visit
Admission: RE | Admit: 2011-06-26 | Discharge: 2011-06-26 | Disposition: A | Discharge status: Home / Self Care | Payer: Medicare Other | Source: Ambulatory Visit | Attending: Gastroenterology | Admitting: Gastroenterology

## 2011-06-26 MED ORDER — IOHEXOL 300 MG/ML  SOLN
100.0000 mL | Freq: Once | INTRAMUSCULAR | Status: AC | PRN
  Administered 2011-06-26: 100 mL via INTRAVENOUS

## 2011-06-28 NOTE — Discharge Summary (Signed)
Family Medicine Resident Discharge Summary  Patient ID: [name] [id number] 78 y.o. [birth year]  Admit date: 06/20/2011  Discharge date and time: 06/24/2011  1:38 PM   Admitting Physician: Carney Living, MD   Discharge Physician: Doralee Albino, MD  Admission Diagnoses: GI Bleed melena GI BLEED GI Bleed  Discharge Diagnoses: GI Bleed  Admission Condition: fair  Discharged Condition: good  Indication for Admission: Supratherapeutic INR, Melena, Anemia  Hospital Course: [name] is a 78 y.o. year old female with atrial fibrillation and a history of GI bleed who presents with melena, anemia and a supratherapeutic INR.   Anemia: Likely from GI bleed. Unsure of location/ but recurrent problem w/ this pt whenever INR becomes supratherapeutic. INR on admission was 3.21. All anticoagulation held during admission. GI consulted and followed throughout admission. Pt received upper endoscopy and colonoscopy during admission as well as capsule swallow study. No evidence of active bleed was located, though cecal AVM noted. Pt transfused 2 units PRBC on 06/21/11, w/ stabilization of Ngb and VS. GI planning to f/u w/ pt in 3wks to evaluate Hgb and possible treatment of Cecal AVM. Pt instructed to hold off on Anticoagulation until 1 wk after discharge. Pt also w/ intermittent Afib and aware of risks benefits of being off anticoagulation for this short amount of time. Pt to discuss Xarelto w/ PCP.  HTN and musculoskeletal pain well controlled on home medical regimen.   A.Fib: Pt on telemetry throughout admission. Intermittently in A. Fib. Anticoagulation held during admission due to profound GI bleed. Pt aware of slight stroke risk while off anticoagulation, but aware of the risk of recurrent severe bleed if anticoagulated.   Consults: GI  Significant Diagnostic Studies: Hgb: 10.5-->9.4-->7.9-->Transfused-->10.4-->9.9-->9.9-->10 Lab Results  Component Value Date   WBC 6.7  06/24/2011   HGB 10.0* 06/24/2011   HCT 30.9* 06/24/2011   MCV 86.6 06/24/2011   PLT 267 06/24/2011   Lab Results  Component Value Date   INR 1.13 06/24/2011   INR 1.22 06/23/2011   INR 1.44 06/22/2011  INR on admission: 3.21  Upper Endoscopy: 5/27:  1. Small gastric nodule, highly likely incidental finding.  2. Otherwise normal endoscopy; no source of melena identified.  Capsule Study: "A little dark material in mid small bowel but otherwise normal capsule endoscopy and no signs of active bleeding and normal colored stool in the colon"  Discharge Exam: Gen: NAD  HEENT: MMM  CV: irregularly irregular. 2+ pulses in extremities  MWU:XLKG  MWN:UUVO non-tender. NABS BM: Noted on day of discharge to be non-melonic and no BRB  Disposition: home  Patient Instructions:  Medication List  As of 06/28/2011  9:50 PM   STOP taking these medications         codeine 15 MG tablet      warfarin 5 MG tablet         TAKE these medications         cyclobenzaprine 10 MG tablet   Commonly known as: FLEXERIL   Take 1 tablet (10 mg total) by mouth at bedtime as needed for muscle spasms.      digoxin 0.125 MG tablet   Commonly known as: LANOXIN   Take 1 tablet (0.125 mg total) by mouth daily.      metolazone 2.5 MG tablet   Commonly known as: ZAROXOLYN      multivitamin per tablet   Take 1 tablet by mouth daily.      pantoprazole 40 MG tablet   Commonly  known as: PROTONIX   Take 1 tablet (40 mg total) by mouth daily at 12 noon.      torsemide 20 MG tablet   Commonly known as: DEMADEX   Take 1 tablet (20 mg total) by mouth daily.      torsemide 20 MG tablet   Commonly known as: DEMADEX   Take 1 tablet (20 mg total) by mouth daily.      traZODone 50 MG tablet   Commonly known as: DESYREL   Take 1 tablet (50 mg total) by mouth at bedtime as needed for sleep.      verapamil 240 MG (CO) 24 hr tablet   Commonly known as: COVERA HS   Take 240 mg by mouth at bedtime.             Activity: activity as tolerated Diet: regular diet Wound Care: none needed  Follow-up with Dr. Ewing Schlein in 3 wks Follow up with [facility] in 1 wk  Follow-up Items: 1. Hgb/Hct. +/- repeat colonoscopy 2. Consider Xarelto as pt w/ multiple episodes of severe GI bleed when supratherapeutic on INR. Pt off anticoagulation at time of DC due to GI bleed. Consider restarting approximately 1wk after DC.   Signed: Shelly Flatten, MD Family Medicine Resident PGY-1 [phone] 06/28/2011 9:50 PM

## 2011-06-29 ENCOUNTER — Ambulatory Visit: Payer: Medicare Other | Admitting: Physical Therapy

## 2011-06-29 NOTE — Discharge Summary (Signed)
Reviewed and agree with Dr. Satira Sark documentation and management.

## 2011-07-01 ENCOUNTER — Inpatient Hospital Stay (HOSPITAL_COMMUNITY)
Admission: EM | Admit: 2011-07-01 | Discharge: 2011-07-05 | DRG: 315 | Disposition: A | Discharge status: Home / Self Care | Payer: Medicare Other | Attending: Cardiovascular Disease | Admitting: Cardiovascular Disease

## 2011-07-01 ENCOUNTER — Encounter (HOSPITAL_COMMUNITY): Payer: Self-pay | Admitting: General Practice

## 2011-07-01 ENCOUNTER — Ambulatory Visit (INDEPENDENT_AMBULATORY_CARE_PROVIDER_SITE_OTHER): Payer: Medicare Other | Admitting: Emergency Medicine

## 2011-07-01 ENCOUNTER — Ambulatory Visit: Payer: Medicare Other

## 2011-07-01 VITALS — BP 124/78 | HR 86 | Temp 97.5°F | Resp 16 | Ht 65.0 in | Wt 161.2 lb

## 2011-07-01 DIAGNOSIS — I10 Essential (primary) hypertension: Secondary | ICD-10-CM | POA: Diagnosis present

## 2011-07-01 DIAGNOSIS — Z950 Presence of cardiac pacemaker: Secondary | ICD-10-CM | POA: Diagnosis present

## 2011-07-01 DIAGNOSIS — R935 Abnormal findings on diagnostic imaging of other abdominal regions, including retroperitoneum: Secondary | ICD-10-CM

## 2011-07-01 DIAGNOSIS — I313 Pericardial effusion (noninflammatory): Secondary | ICD-10-CM | POA: Diagnosis present

## 2011-07-01 DIAGNOSIS — Z888 Allergy status to other drugs, medicaments and biological substances status: Secondary | ICD-10-CM

## 2011-07-01 DIAGNOSIS — M549 Dorsalgia, unspecified: Secondary | ICD-10-CM | POA: Diagnosis present

## 2011-07-01 DIAGNOSIS — R188 Other ascites: Secondary | ICD-10-CM | POA: Diagnosis present

## 2011-07-01 DIAGNOSIS — I318 Other specified diseases of pericardium: Principal | ICD-10-CM | POA: Diagnosis present

## 2011-07-01 DIAGNOSIS — I4821 Permanent atrial fibrillation: Secondary | ICD-10-CM | POA: Diagnosis present

## 2011-07-01 DIAGNOSIS — R29898 Other symptoms and signs involving the musculoskeletal system: Secondary | ICD-10-CM | POA: Diagnosis present

## 2011-07-01 DIAGNOSIS — D649 Anemia, unspecified: Secondary | ICD-10-CM

## 2011-07-01 DIAGNOSIS — E669 Obesity, unspecified: Secondary | ICD-10-CM | POA: Diagnosis present

## 2011-07-01 DIAGNOSIS — I69998 Other sequelae following unspecified cerebrovascular disease: Secondary | ICD-10-CM

## 2011-07-01 DIAGNOSIS — Z7901 Long term (current) use of anticoagulants: Secondary | ICD-10-CM

## 2011-07-01 DIAGNOSIS — Z79899 Other long term (current) drug therapy: Secondary | ICD-10-CM

## 2011-07-01 DIAGNOSIS — I4891 Unspecified atrial fibrillation: Secondary | ICD-10-CM | POA: Diagnosis present

## 2011-07-01 DIAGNOSIS — Z823 Family history of stroke: Secondary | ICD-10-CM

## 2011-07-01 DIAGNOSIS — Z5181 Encounter for therapeutic drug level monitoring: Secondary | ICD-10-CM

## 2011-07-01 LAB — POCT CBC
Granulocyte percent: 64.6 %G (ref 37–80)
HCT, POC: 36.3 % — AB (ref 37.7–47.9)
MID (cbc): 0.6 (ref 0–0.9)
POC Granulocyte: 4.2 (ref 2–6.9)
POC LYMPH PERCENT: 26.2 %L (ref 10–50)
Platelet Count, POC: 359 10*3/uL (ref 142–424)
RBC: 4.16 M/uL (ref 4.04–5.48)
RDW, POC: 15 %

## 2011-07-01 LAB — DIFFERENTIAL
Basophils Absolute: 0 10*3/uL (ref 0.0–0.1)
Basophils Relative: 1 % (ref 0–1)
Eosinophils Absolute: 0.2 10*3/uL (ref 0.0–0.7)
Eosinophils Relative: 3 % (ref 0–5)
Neutrophils Relative %: 64 % (ref 43–77)

## 2011-07-01 LAB — PROTIME-INR
INR: 1.07 (ref <1.50)
INR: 1.15 (ref 0.00–1.49)
Prothrombin Time: 14.3 seconds (ref 11.6–15.2)

## 2011-07-01 LAB — CBC
MCH: 28 pg (ref 26.0–34.0)
MCHC: 32 g/dL (ref 30.0–36.0)
MCV: 87.5 fL (ref 78.0–100.0)
Platelets: 318 10*3/uL (ref 150–400)
RDW: 13.9 % (ref 11.5–15.5)

## 2011-07-01 LAB — COMPREHENSIVE METABOLIC PANEL
Alkaline Phosphatase: 94 U/L (ref 39–117)
Calcium: 9.7 mg/dL (ref 8.4–10.5)
GFR calc non Af Amer: 47 mL/min — ABNORMAL LOW (ref >90)
Glucose, Bld: 118 mg/dL — ABNORMAL HIGH (ref 70–99)
Sodium: 136 mEq/L (ref 135–145)
Total Bilirubin: 0.7 mg/dL (ref 0.3–1.2)
Total Protein: 7.3 g/dL (ref 6.0–8.3)

## 2011-07-01 LAB — IFOBT (OCCULT BLOOD): IFOBT: NEGATIVE

## 2011-07-01 MED ORDER — MORPHINE SULFATE 4 MG/ML IJ SOLN
4.0000 mg | Freq: Once | INTRAMUSCULAR | Status: AC
  Administered 2011-07-01: 4 mg via INTRAVENOUS
  Filled 2011-07-01: qty 1

## 2011-07-01 MED ORDER — CODEINE SULFATE 15 MG PO TABS: 15.0000 mg | ORAL_TABLET | Freq: Four times a day (QID) | ORAL | Status: DC | PRN

## 2011-07-01 MED ORDER — DIGOXIN 125 MCG PO TABS
125.0000 ug | ORAL_TABLET | Freq: Every day | ORAL | Status: DC
  Administered 2011-07-01 – 2011-07-05 (×5): 125 ug via ORAL
  Filled 2011-07-01 (×5): qty 1

## 2011-07-01 MED ORDER — VERAPAMIL HCL 120 MG PO TABS
180.0000 mg | ORAL_TABLET | Freq: Every day | ORAL | Status: DC
  Administered 2011-07-01: 180 mg via ORAL
  Filled 2011-07-01 (×2): qty 1.5

## 2011-07-01 MED ORDER — PANTOPRAZOLE SODIUM 40 MG PO TBEC
40.0000 mg | DELAYED_RELEASE_TABLET | Freq: Every day | ORAL | Status: DC
  Administered 2011-07-01 – 2011-07-05 (×5): 40 mg via ORAL
  Filled 2011-07-01 (×4): qty 1

## 2011-07-01 MED ORDER — TORSEMIDE 20 MG PO TABS
20.0000 mg | ORAL_TABLET | Freq: Every day | ORAL | Status: DC
  Administered 2011-07-01 – 2011-07-05 (×5): 20 mg via ORAL
  Filled 2011-07-01 (×5): qty 1

## 2011-07-01 MED ORDER — DIAZEPAM 5 MG/ML IJ SOLN
2.5000 mg | Freq: Once | INTRAMUSCULAR | Status: AC
  Administered 2011-07-01: 10 mg via INTRAVENOUS
  Filled 2011-07-01: qty 2

## 2011-07-01 MED ORDER — MORPHINE SULFATE 4 MG/ML IJ SOLN
4.0000 mg | INTRAMUSCULAR | Status: DC | PRN
  Administered 2011-07-01 – 2011-07-05 (×5): 4 mg via INTRAVENOUS
  Filled 2011-07-01 (×5): qty 1

## 2011-07-01 NOTE — ED Provider Notes (Addendum)
History     CSN: [id number]  Arrival date & time 07/01/11  1413   First MD Initiated Contact with Patient 07/01/11 1420      Chief Complaint  Patient presents with  . Flank Pain    (Consider location/radiation/quality/duration/timing/severity/associated sxs/prior treatment) HPI  88yoF h/o chronic pericardial effusion, recent GI bleed, nephrolithiasis pw back pain. Pt states the pain Left flank/lower back. Present intermittently x 2 weeks. Worse now She has been taking 15mg  codeine as prescribed by her PMD. Denies cp/sob/palpitations but occ "I have to catch my breath" when the pain is severe. Was seen by them this morning and referred to ED with thoughts that her pain may be related to her pericardial effusion and cardiology to see in ED. Denies history of recent trauma/falls. Denies h/o malignancy, DM, immunocompromise  injection drug use, immunosuppression, indwelling urinary catheter, prolonged steroid use, skin or urinary tract infection. No numbness/tingling/weakness of extremities. Denies fever/chills. Denies saddle anesthesia, no urinary incontinence or retention. She is no longer taking coumadin    ED Notes, ED Provider Notes from 07/01/11 0000 to 07/01/11 14:16:54       Delbert Harness, RN 07/01/2011 14:16      Pt in per [facility] from [place] c/o L flank worsening with movement & breathing, pt denies CP, SOB, per EMS informed by Ernesto Rutherford MD, pt had an abnormal x-ray (enlarged heart) & CT, Pt A&O x4, follows commands, speaks in complete sentences, follows commands     Past Medical History  Diagnosis Date  . Pericardial effusion     Chronic. Last echo in April of 2012.   . Atrial fibrillation     Chronic  . Chronic anticoagulation     on Coumadin  . History of GI bleed     once treated with Fe infusion  . HTN (hypertension)   . Sleep apnea   . Pacemaker     placed for tachybrady.   . Valvular heart disease   . Obesity   . LVH (left ventricular hypertrophy)   . Stroke April  2013    right frontal  . Thrombophlebitis 1967    associated with pregnancy  . Diverticulosis     Past Surgical History  Procedure Date  . Pacemaker insertion 2001    Last generator in 2006; interrogated Dec 2012  . Breast lumpectomy     bilaterally; negative for cancer  . Tonsillectomy 1950  . Adenoidectomy   . Esophagogastroduodenoscopy 05/19/2011    Procedure: ESOPHAGOGASTRODUODENOSCOPY (EGD);  Surgeon: Shirley Friar, MD;  Location: [facility] ENDOSCOPY;  Service: Endoscopy;  Laterality: N/A;  doctor aware of inr   will try to be here no later than 230  . Cholecystectomy 2005  . Esophagogastroduodenoscopy 06/22/2011    Procedure: ESOPHAGOGASTRODUODENOSCOPY (EGD);  Surgeon: Willis Modena, MD;  Location: [facility] ENDOSCOPY;  Service: Endoscopy;  Laterality: N/A;  Check PT/INR in am  . Givens capsule study 06/23/2011    Procedure: GIVENS CAPSULE STUDY;  Surgeon: Willis Modena, MD;  Location: [facility] ENDOSCOPY;  Service: Endoscopy;  Laterality: N/A;    Family History  Problem Relation Age of Onset  . Stroke Mother   . Stroke Father   . Pneumonia Father     History  Substance Use Topics  . Smoking status: Never Smoker   . Smokeless tobacco: Never Used  . Alcohol Use: No    OB History    Grav Para Term Preterm Abortions TAB SAB Ect Mult Living  Review of Systems  All other systems reviewed and are negative.  except as noted HPI   Allergies  Iodinated diagnostic agents; Anti-inflammatory enzyme; Arthrotec; Biaxin; and Plavix  Home Medications   Current Outpatient Rx  Name Route Sig Dispense Refill  . CODEINE SULFATE 15 MG PO TABS Oral Take 15 mg by mouth every 6 (six) hours as needed. For pain.    Marland Kitchen DIGOXIN 0.125 MG PO TABS Oral Take 125 mcg by mouth daily.    Marland Kitchen METOLAZONE 2.5 MG PO TABS Oral Take 2.5 mg by mouth daily.    . MULTIVITAMINS PO TABS Oral Take 1 tablet by mouth daily.      Marland Kitchen PANTOPRAZOLE SODIUM 40 MG PO TBEC Oral Take 40 mg by mouth daily.    .  TORSEMIDE 20 MG PO TABS Oral Take 20 mg by mouth daily.    Marland Kitchen VERAPAMIL HCL ER (CO) 240 MG PO TB24 Oral Take 240 mg by mouth at bedtime.    . TRAZODONE HCL 50 MG PO TABS Oral Take 1 tablet (50 mg total) by mouth at bedtime as needed for sleep. 30 tablet 0  . WARFARIN SODIUM 5 MG PO TABS Oral Take 5 mg by mouth daily. Was taking 5 mg qd for six days a week and 2.5 mg 1 day. Coumadin was held during hospital stay. Starts back today.      BP 159/70  Pulse 60  Temp(Src) 97.7 F (36.5 C) (Oral)  Resp 24  SpO2 95%  Physical Exam  Nursing note and vitals reviewed. Constitutional: She is oriented to person, place, and time. She appears well-developed.       Pacing in room  HENT:  Head: Atraumatic.  Mouth/Throat: Oropharynx is clear and moist.  Eyes: Conjunctivae and EOM are normal. Pupils are equal, round, and reactive to light.  Neck: Normal range of motion. Neck supple.  Cardiovascular: Normal rate, regular rhythm and intact distal pulses.   Murmur heard. Pulmonary/Chest: Effort normal and breath sounds normal. No respiratory distress. She has no wheezes. She has no rales.  Abdominal: Soft. She exhibits no distension. There is no tenderness. There is no rebound and no guarding.  Musculoskeletal: Normal range of motion. She exhibits no tenderness.       No rash, min ttp Lt paraspinal thoracic/lumbar No cvat +pain with light touch  Neurological: She is alert and oriented to person, place, and time.       Strength 5/5 b/l LE No saddle anesthesia  Skin: Skin is warm and dry. No rash noted.  Psychiatric: She has a normal mood and affect.    ED Course  Procedures (including critical care time)  Labs Reviewed  CBC - Abnormal; Notable for the following:    Hemoglobin 11.6 (*)    All other components within normal limits  DIFFERENTIAL - Abnormal; Notable for the following:    Monocytes Relative 13 (*)    All other components within normal limits  COMPREHENSIVE METABOLIC PANEL -  Abnormal; Notable for the following:    Glucose, Bld 118 (*)    GFR calc non Af Amer 47 (*)    GFR calc Af Amer 55 (*)    All other components within normal limits  URINALYSIS, ROUTINE W REFLEX MICROSCOPIC   Dg Chest 2 View  07/01/2011  *RADIOLOGY REPORT*  Clinical Data: Follow-up pericardial effusion.  CHEST - 2 VIEW  Comparison: None.  Findings: Enlargement of the cardiopericardial silhouette.  No pulmonary edema.  No focal opacities or effusion.  Peribronchial thickening present.  No acute bony abnormality.  Right pacer is in place with leads in the right base and right ventricle.  IMPRESSION: Enlarged cardiopericardial silhouette.  Bronchitic changes.  Clinically significant discrepancy from primary report, if provided: None  Original Report Authenticated By: Cyndie Chime, M.D.    1. Back pain   2. Pericardial effusion     MDM  Flank pain/back pain. DDx broad including msk, nephrolithiasis, possible pre-rash zoster. She does have chronic pericardial effusion and will be seen by cardiology for same-- possible cause of sx? Cardiology has seen patient in ED and admit for eval possible pericardial drainage. Ct A/P 5/31 with partial bowel obstruction but pt without N/V. No nephrolithiasis or AAA at that time. She is declining an additional CT scan A/P today.        Forbes Cellar, MD 07/01/11 1617  Forbes Cellar, MD 07/01/11 4098  Forbes Cellar, MD 07/01/11 [phone]

## 2011-07-01 NOTE — ED Notes (Signed)
Pt in per [facility] from [place] c/o L flank worsening with movement & breathing, pt denies CP, SOB, per EMS informed by [place] MD, pt had an abnormal x-ray (enlarged heart) & CT, Pt A&O x4, follows commands, speaks in complete sentences, follows commands

## 2011-07-01 NOTE — ED Notes (Signed)
Called report to [phone] spoke to neeli

## 2011-07-01 NOTE — Progress Notes (Signed)
  Subjective:    Patient ID: [name], female    DOB: February 18, 1923, 78 y.o.   MRN: [id number]  HPI [name] presents for followup following recent admission with an acute GI bleed. She required 2 units of blood transfusion. Her nose elevated at 3.4 on admission . No sources of bleeding was identified. She was stable at 11 on discharge. She enters today because of severe cramping in the right flank area and right posterior chest. She also feels somewhat short of breath.    Review of Systems     Objective:   Physical Exam plantar to round the patient was leaning over the exam table. She was breathing rapidly but shallow breaths . Breath sounds were symmetrical. Cardiac exam revealed a rapid rate but regular there is a 2/6 systolic murmur at the left sternal border. Abdominal exam reveals tenderness midepigastrium and right upper abdomen  [facility] reading (PRIMARY) by  Dr.Leveda Kendrix chest x-ray shows significant cardiomegaly consistent with a pericardial effusion which was identified on previous CT        Assessment & Plan:     I spoke with Dr. Allyson Sabal on call for [facility] heart and vascular. Patient was placed on O2 2 L transport to the hospital via EMS on the monitor. I spoke with the charge nurse at town and they are aware of the patient's transport. Patient has been off of Coumadin now for almost a week and this may be an opportune time to do a pericardiocentesis with studies of the fluid we still do not have an explanation for the patient's back spasms and pain in whether these are related to her pericardial effusion.

## 2011-07-01 NOTE — Progress Notes (Signed)
Subjective:    Patient ID: [name], female    DOB: 11-25-1923, 78 y.o.   MRN: [id number]  HPI  [name] comes in today to see [name] for post hospital follow up.  She was admitted for GI Bleed with drop in HGB and increase in INR.  After 4 days she was discharged with no identifiable source of bleed.  She states that she has not had any dark tarry stools since her admission and is feeling well.  She has had a decrease in her appetite for 3 weeks but no nausea or vomiting and does report some discomfort in abdomen.  She has no dizziness, SOB, HA, CP, edema, myalgias.  She is complaining of returning back spasms that recurred yesterday.  She was having trouble with her back 1 day prior to admission but it improved slightly while she was inpatient. She states it worse today.  She was told to hold her Coumadin for 1 week and is her to follow up to discuss Xarelto.  She is hesitant to change to a new medication is ok with continuing Coumadin.    She had a CT Abdomen on 06/25/11 and was notified by Dr. Marlane Hatcher RN to follow up in 3 weeks.  Of note, she is found to have a pericardial effusion.  There is no plan at this point for cardiology evaluation    Review of Systems  Constitutional: Positive for activity change and fatigue. Negative for fever and chills.  Respiratory: Negative for cough, chest tightness and shortness of breath.   Cardiovascular: Positive for leg swelling. Negative for chest pain and palpitations.  Gastrointestinal: Positive for abdominal pain. Negative for nausea, vomiting, constipation and rectal pain.  Musculoskeletal: Positive for back pain.  Skin: Negative for color change.  Neurological: Negative for dizziness and weakness.       Objective:   Physical Exam  Constitutional: No distress.  Cardiovascular: Normal rate and regular rhythm.   Murmur heard.  Systolic murmur is present with a grade of 2/6   Diastolic murmur is present with a grade of 6/6       NO  JVD noted  Pulmonary/Chest: Effort normal and breath sounds normal.  Abdominal: There is tenderness in the right upper quadrant and epigastric area.    Musculoskeletal:       Thoracic back: She exhibits decreased range of motion, tenderness and pain.       Back:  Skin: Skin is warm. She is not diaphoretic.    Filed Vitals:   07/01/11 1137  BP: 130/71  Pulse: 88  Temp: 97.5 F (36.4 C)  Resp: 16    Results for orders placed in visit on 07/01/11  POCT CBC      Component Value Range   WBC 6.5  4.6 - 10.2 (K/uL)   Lymph, poc 1.7  0.6 - 3.4    POC LYMPH PERCENT 26.2  10 - 50 (%L)   MID (cbc) 0.6  0 - 0.9    POC MID % 9.2  0 - 12 (%M)   POC Granulocyte 4.2  2 - 6.9    Granulocyte percent 64.6  37 - 80 (%G)   RBC 4.16  4.04 - 5.48 (M/uL)   Hemoglobin 11.2 (*) 12.2 - 16.2 (g/dL)   HCT, POC 78.2 (*) 95.6 - 47.9 (%)   MCV 87.2  80 - 97 (fL)   MCH, POC 26.9 (*) 27 - 31.2 (pg)   MCHC 30.9 (*) 31.8 - 35.4 (g/dL)  RDW, POC 15.0     Platelet Count, POC 359  142 - 424 (K/uL)   MPV 10.2  0 - 99.8 (fL)  IFOBT (OCCULT BLOOD)      Component Value Range   IFOBT Negative        [facility] reading (PRIMARY) by  KaraDaub  CXR Very large pericardial effusion  .     Assessment & Plan:  Pericardial Effusion seen on CT 06/25/11.  CXR repeated today and enlarged. Hypoxia S/P GI bleed unknown source.  HGB 11.2 today.  Anticoag therapy Back pain, likely from pericardial effusion  Patient is currently off anticoagulation therapy from her hospital admission and with the enlarging pericardial effusion, she is transported by EMS on Oxygen 2 liters for cardiac evaluation.  [name] was evaluated and examined by [name].

## 2011-07-01 NOTE — H&P (Signed)
[name] is an 78 y.o. female.   Chief Complaint:  Pericardial effusion, Back pain HPI:   The patient is an 78 year old Caucasian female with history of permanent pacemaker, chronic 8 atrial fibrillation and chronic anticoagulation on Coumadin, hypertension, mild aortic stenosis, moderate tricuspid insufficiency,chronic lower extremity edema, GI bleeding and recent CVA. She was discharged from [facility] April 24th 2013 after suffering a CVA which affected her left side.  Patient was last seen in our office on May 17 at which time she was making an improvement in strength. She was ambulating freely without walker. He does have some residual left-sided weakness.  Patient presents today from Dr. Ellis Parents office severe left flank pain which began 2 weeks ago and cardiomegaly on chest x-ray.  Patient states she has no other complaints. No nausea no vomiting, shortness of breath, chest pain, abdominal pain, dysuria, hematuria, hematochezia, cough, congestion, orthopnea.    Medications:  Potassium 10 mEq Monday Wednesday Friday, verapamil 180 mg daily, torsemide 20 mg daily, Coumadin 2.5 mg on Friday and 5 mg other days, vitamin B 100 mg daily, diphenhydramine 20 mg at bedtime, multivitamin daily, Lanoxin 125 mcg daily, melatonin 5 mg, Protonix 40 mg daily, MiraLAX daily  Allergies: Anti-inflammatories and metoprolol intolerant  Past Medical History  Diagnosis Date  . Pericardial effusion     Chronic. Last echo in April of 2012.   . Atrial fibrillation     Chronic  . Chronic anticoagulation     on Coumadin  . History of GI bleed     once treated with Fe infusion  . HTN (hypertension)   . Sleep apnea   . Pacemaker     placed for tachybrady.   . Valvular heart disease   . Obesity   . LVH (left ventricular hypertrophy)   . Stroke April 2013    right frontal  . Thrombophlebitis 1967    associated with pregnancy  . Diverticulosis     Past Surgical History  Procedure Date  . Pacemaker  insertion 2001    Last generator in 2006; interrogated Dec 2012  . Breast lumpectomy     bilaterally; negative for cancer  . Tonsillectomy 1950  . Adenoidectomy   . Esophagogastroduodenoscopy 05/19/2011    Procedure: ESOPHAGOGASTRODUODENOSCOPY (EGD);  Surgeon: Shirley Friar, MD;  Location: [facility] ENDOSCOPY;  Service: Endoscopy;  Laterality: N/A;  doctor aware of inr   will try to be here no later than 230  . Cholecystectomy 2005  . Esophagogastroduodenoscopy 06/22/2011    Procedure: ESOPHAGOGASTRODUODENOSCOPY (EGD);  Surgeon: Willis Modena, MD;  Location: [facility] ENDOSCOPY;  Service: Endoscopy;  Laterality: N/A;  Check PT/INR in am  . Givens capsule study 06/23/2011    Procedure: GIVENS CAPSULE STUDY;  Surgeon: Willis Modena, MD;  Location: [facility] ENDOSCOPY;  Service: Endoscopy;  Laterality: N/A;    Family History  Problem Relation Age of Onset  . Stroke Mother   . Stroke Father   . Pneumonia Father    Social History:  reports that she has never smoked. She has never used smokeless tobacco. She reports that she does not drink alcohol or use illicit drugs.  Allergies:  Allergies  Allergen Reactions  . Iodinated Diagnostic Agents Hives    HIVES S/P IV CONTRAST INJECTION,WILL NEED 13 HR PREP FOR FUTURE INJECTIONS, ok s/p 50mg  po benadryl//a.calhoun  . Anti-Inflammatory Enzyme (Nutritional Supplements)     Retains fluids and headaches  . Arthrotec (Diclofenac-Misoprostol) Other (See Comments)    unknown  . Biaxin (  Clarithromycin) Other (See Comments)    unknown  . Plavix (Clopidogrel Bisulfate) Other (See Comments)    unknown     (Not in a hospital admission)  Results for orders placed during the hospital encounter of 07/01/11 (from the past 48 hour(s))  CBC     Status: Abnormal   Collection Time   07/01/11  2:53 PM      Component Value Range Comment   WBC 6.5  4.0 - 10.5 (K/uL)    RBC 4.15  3.87 - 5.11 (MIL/uL)    Hemoglobin 11.6 (*) 12.0 - 15.0 (g/dL)    HCT 16.1  09.6 -  04.5 (%)    MCV 87.5  78.0 - 100.0 (fL)    MCH 28.0  26.0 - 34.0 (pg)    MCHC 32.0  30.0 - 36.0 (g/dL)    RDW 40.9  81.1 - 91.4 (%)    Platelets 318  150 - 400 (K/uL)   DIFFERENTIAL     Status: Abnormal   Collection Time   07/01/11  2:53 PM      Component Value Range Comment   Neutrophils Relative 64  43 - 77 (%)    Neutro Abs 4.2  1.7 - 7.7 (K/uL)    Lymphocytes Relative 19  12 - 46 (%)    Lymphs Abs 1.3  0.7 - 4.0 (K/uL)    Monocytes Relative 13 (*) 3 - 12 (%)    Monocytes Absolute 0.9  0.1 - 1.0 (K/uL)    Eosinophils Relative 3  0 - 5 (%)    Eosinophils Absolute 0.2  0.0 - 0.7 (K/uL)    Basophils Relative 1  0 - 1 (%)    Basophils Absolute 0.0  0.0 - 0.1 (K/uL)   COMPREHENSIVE METABOLIC PANEL     Status: Abnormal   Collection Time   07/01/11  2:53 PM      Component Value Range Comment   Sodium 136  135 - 145 (mEq/L)    Potassium 3.6  3.5 - 5.1 (mEq/L)    Chloride 96  96 - 112 (mEq/L)    CO2 28  19 - 32 (mEq/L)    Glucose, Bld 118 (*) 70 - 99 (mg/dL)    BUN 20  6 - 23 (mg/dL)    Creatinine, Ser 7.82  0.50 - 1.10 (mg/dL)    Calcium 9.7  8.4 - 10.5 (mg/dL)    Total Protein 7.3  6.0 - 8.3 (g/dL)    Albumin 4.0  3.5 - 5.2 (g/dL)    AST 24  0 - 37 (U/L)    ALT 15  0 - 35 (U/L)    Alkaline Phosphatase 94  39 - 117 (U/L)    Total Bilirubin 0.7  0.3 - 1.2 (mg/dL)    GFR calc non Af Amer 47 (*) >90 (mL/min)    GFR calc Af Amer 55 (*) >90 (mL/min)    Dg Chest 2 View  07/01/2011  *RADIOLOGY REPORT*  Clinical Data: Follow-up pericardial effusion.  CHEST - 2 VIEW  Comparison: None.  Findings: Enlargement of the cardiopericardial silhouette.  No pulmonary edema.  No focal opacities or effusion.  Peribronchial thickening present.  No acute bony abnormality.  Right pacer is in place with leads in the right base and right ventricle.  IMPRESSION: Enlarged cardiopericardial silhouette.  Bronchitic changes.  Clinically significant discrepancy from primary report, if provided: None  Original Report  Authenticated By: Cyndie Chime, M.D.   06/26/11  CT ABDOMEN AND PELVIS WITH  CONTRAST   Technique:  Multidetector CT imaging of the abdomen and pelvis was performed following the standard protocol during bolus administration of intravenous contrast.   Contrast: OMNIPAQUE IOHEXOL 300 MG/ML  SOLN   Comparison: CT abdomen pelvis of 08/06/2005   Findings: The lung bases are clear with only a tiny 3 mm noncalcified nodule in the right lower lobe not included on prior CTs.  There is a large pericardial effusion now present which is a new finding.  Cardiomegaly is stable with permanent pacemaker wires remaining.  The liver enhances with no focal abnormality and no ductal dilatation is seen.  The contours of the liver are slightly irregular raising the possibility of changes of cirrhosis. Surgical clips are present from prior cholecystectomy.  The pancreas is normal in size and the pancreatic duct is not dilated. The adrenal glands and spleen are unremarkable.  The stomach is moderately distended with fluid with no abnormality noted.  The kidneys enhance with no calculus or mass other than probable small cyst in the upper pole of the left kidney posteriorly.  On delayed images, the pelvocaliceal systems are unremarkable.  The abdominal aorta is normal in caliber with atheromatous change appears patent. No adenopathy is seen with only small retroperitoneal nodes present.   However, there is dilatation of small bowel with apparent change in caliber within the mid pelvis without definite mass.  This may be due to adhesion.  The distal ileum and terminal ileum are decompressed.   The urinary bladder is unremarkable.  The uterus is normal in size. No adnexal lesion is seen.  Multiple rectosigmoid colonic diverticula are present.  No fluid is seen within the pelvis.  No bony abnormality is seen.   The CT technologist contacted me that this patient did develop hives after completion  of the study.  Dr. Carlota Raspberry evaluated the patient and 50 mg of Benadryl were given orally and the patient was observed.   IMPRESSION:   1.  Partial small bowel obstruction with change in caliber probably within the mid pelvis.  No definite mass.  Question adhesion. 2.  Large pericardial effusion. 3.  Multiple rectosigmoid colonic diverticula. 4.  Slightly nodular contour of the liver.  Question cirrhosis.   5.  Mild contrast reaction as described above.  Review of Systems  Constitutional: Negative for fever and diaphoresis.  HENT: Negative for congestion.   Eyes: Negative for blurred vision.  Respiratory: Negative for cough and shortness of breath.   Cardiovascular: Positive for leg swelling. Negative for chest pain, orthopnea and PND.  Gastrointestinal: Negative for nausea, vomiting, abdominal pain, diarrhea, constipation, blood in stool and melena.  Genitourinary: Negative for dysuria and hematuria.  Musculoskeletal: Positive for back pain (left flank).  Neurological: Negative for dizziness, weakness and headaches.    Blood pressure 159/70, pulse 60, temperature 97.7 F (36.5 C), temperature source Oral, resp. rate 24, SpO2 95.00%. Physical Exam  Constitutional: She is oriented to person, place, and time. She appears well-developed and well-nourished. She appears distressed.       The patient was in severe pain.  HENT:  Head: Normocephalic and atraumatic.  Eyes: EOM are normal. Pupils are equal, round, and reactive to light. No scleral icterus.  Neck: Normal range of motion. Neck supple.  Cardiovascular: Normal rate and regular rhythm.   Murmur heard.  Systolic murmur is present with a grade of 2/6  Pulses:      Radial pulses are 2+ on the right side, and 2+ on the left  side.       Dorsalis pedis pulses are 1+ on the right side, and 1+ on the left side.       No carotid bruit  Respiratory: Effort normal and breath sounds normal. She has no wheezes. She has no rales.  GI:  Soft. Bowel sounds are normal.  Musculoskeletal: She exhibits edema. Tenderness: Left flank.       Nonpitting lower extremity edema  Lymphadenopathy:    She has no cervical adenopathy.  Neurological: She is alert and oriented to person, place, and time. She exhibits normal muscle tone.  Skin: Skin is warm and dry.  Psychiatric: She has a normal mood and affect.     Assessment/Plan Patient Active Hospital Problem List: Back pain, Left Flank (07/01/2011) Atrial fibrillation (04/14/2010) Pericardial effusion () Chronic anticoagulation () HTN (hypertension) () Pacemaker (05/21/2010)  Plan:  Admit patient to telemetry. She was told to hold her coumadin for a week, which it has been.  Her last INR was 1.1.  Rechecking. With a large pericardial effusion on CT 5/31, it may be a good idea to tap it prior to restarting anticoagulation.  Back pain has subsided with morphine and the patient feels a lot better.  Will continue.  CT of the abdomen and Pelvis is pending.  She does have a prior history of kidney stones.  UA pending as well.    HAGER, BRYAN 07/01/2011, 4:01 PM  I have seen and examined the patient along with HAGER, BRYAN, PA.  I have reviewed the chart, notes and new data.  I agree with PA's note.  Key new complaints: left lumbar area pain. She denies dyspnea. Pain is worsened by very deep breaths. Edema is chronic. Key examination changes: JVP slightly elevated (5-6 cm above sternal angle), but there is no real hepatojugular reflux, Kussmaul's or prominent v waves. No pulsus paradoxus. Key new findings / data: the CT showed incidental finding of large pericardial effusion, which is primarily posterior to the right atrium and the left ventricle; there is not a large anterior or diaphragmatic pocket of fluid. An echo in April showed trivial effusion.  PLAN: Agreed that the fact that she is off warfarin offers a window of opportunity to perform pericardiocentesis, but there is not an easy  window to perform it from, there is no clinical tamponade and the yield of diagnostic pericardiocentesis is statistically around 30%.  Will repeat an echo before deciding on necessity and appropriateness of pericardiocentesis. There is no clear etiology for it (no lung or breast cancer history or mass on CT, no recent trauma -pacemaker is 78 years old, no overt infection, etc), but simple observation may still be the appropriate approach.  Thurmon Fair, MD, [facility] [phone] 07/01/2011, 5:27 PM

## 2011-07-02 ENCOUNTER — Ambulatory Visit: Payer: Medicare Other | Admitting: Physical Therapy

## 2011-07-02 LAB — BASIC METABOLIC PANEL
CO2: 27 mEq/L (ref 19–32)
Calcium: 9 mg/dL (ref 8.4–10.5)
Chloride: 95 mEq/L — ABNORMAL LOW (ref 96–112)
GFR calc Af Amer: 51 mL/min — ABNORMAL LOW (ref >90)
Sodium: 134 mEq/L — ABNORMAL LOW (ref 135–145)

## 2011-07-02 LAB — URINALYSIS, ROUTINE W REFLEX MICROSCOPIC
Hgb urine dipstick: NEGATIVE
Ketones, ur: NEGATIVE mg/dL
Protein, ur: NEGATIVE mg/dL
Urobilinogen, UA: 0.2 mg/dL (ref 0.0–1.0)

## 2011-07-02 LAB — CBC
MCV: 86.8 fL (ref 78.0–100.0)
Platelets: 282 10*3/uL (ref 150–400)
RBC: 3.65 MIL/uL — ABNORMAL LOW (ref 3.87–5.11)
WBC: 7.9 10*3/uL (ref 4.0–10.5)

## 2011-07-02 LAB — URINE MICROSCOPIC-ADD ON

## 2011-07-02 MED ORDER — VERAPAMIL HCL ER 180 MG PO TBCR
180.0000 mg | EXTENDED_RELEASE_TABLET | Freq: Every day | ORAL | Status: DC
  Administered 2011-07-02 – 2011-07-05 (×4): 180 mg via ORAL
  Filled 2011-07-02 (×4): qty 1

## 2011-07-02 NOTE — Clinical Documentation Improvement (Signed)
GENERIC DOCUMENTATION CLARIFICATION QUERY  THIS DOCUMENT IS NOT A PERMANENT PART OF THE MEDICAL RECORD   Please update your documentation within the medical record to reflect your response to this query.                                                                                         07/02/11   Wilburt Finlay PA and[facility]or Associates,  In a better effort to capture your patient's severity of illness, reflect appropriate length of stay and utilization of resources, a review of the patient medical record has revealed the following indicators:  "She was discharged from [facility] April 24th 2013 after suffering a CVA which affected her left side. Patient was last seen in our office on May 17 at which time she was making an improvement in strength. She was ambulating freely without walker. She does have some residual left-sided weakness." Keno Caraway  07/01/2011, 4:01 PM Thurmon Fair, MD, [facility]   07/01/2011, 5:27 PM   Based on your clinical judgment, please document in the progress notes and discharge summary if a condition below provides greater specificity regarding the "residual left-sided weakness":   - Hemiparesis   - Other Condition   - Unable to Clinically Determine   In responding to this query please exercise your independent judgment.  The fact that a query is asked, does not imply that any particular answer is desired or expected.   Reviewed:  no additional documentation provided  Thank You,  Jerral Ralph  RN BSN CCDS Certified Clinical Documentation Specialist: Cell   [phone]  Health Information Management [place]  TO RESPOND TO THE THIS QUERY, FOLLOW THE INSTRUCTIONS BELOW:  1. If needed, update documentation for the patient's encounter via the notes activity.  2. Access this query again and click edit on the In [employer].  3. After updating, or not, click F2 to complete all highlighted (required) fields concerning your review. Select "additional  documentation in the medical record" OR "no additional documentation provided".  4. Click Sign note button.  5. The deficiency will fall out of your In Basket *Please let us know if you are not able to complete this workflow by phone or e-mail (listed below).

## 2011-07-02 NOTE — Progress Notes (Signed)
Utilization Review Completed.[name] T6/06/2011   

## 2011-07-02 NOTE — Progress Notes (Signed)
  Echocardiogram 2D Echocardiogram has been performed.  Ragena Fiola, Real Cons 07/02/2011, 10:17 AM

## 2011-07-02 NOTE — Progress Notes (Signed)
[facility]  Subjective: No Complaints.  Back pain has just about resolved but is usually a daily problem for her.  Objective: Vital signs in last 24 hours: Temp:  [97.5 F (36.4 C)-98.7 F (37.1 C)] 98.5 F (36.9 C) (06/06 0500) Pulse Rate:  [60-88] 67  (06/06 1037) Resp:  [16-24] 20  (06/06 0500) BP: (112-159)/(51-78) 127/54 mmHg (06/06 1043) SpO2:  [93 %-99 %] 93 % (06/06 0500) Weight:  [73.1 kg (161 lb 2.5 oz)-73.483 kg (162 lb)] 73.483 kg (162 lb) (06/06 0500) Last BM Date: 07/01/11  Intake/Output from previous day: 06/05 0701 - 06/06 0700 In: 880 [P.O.:880] Out: 350 [Urine:350] Intake/Output this shift: Total I/O In: 420 [P.O.:420] Out: -   Medications Current Facility-Administered Medications  Medication Dose Route Frequency Provider Last Rate Last Dose  . codeine tablet 15 mg  15 mg Oral Q6H PRN Wilburt Finlay, PA      . diazepam (VALIUM) injection 2.5 mg  2.5 mg Intravenous Once Forbes Cellar, MD   10 mg at 07/01/11 1502  . digoxin (LANOXIN) tablet 125 mcg  125 mcg Oral Daily Wilburt Finlay, PA   125 mcg at 07/02/11 1037  . morphine 4 MG/ML injection 4 mg  4 mg Intravenous Once Forbes Cellar, MD   4 mg at 07/01/11 1501  . morphine 4 MG/ML injection 4 mg  4 mg Intravenous Q4H PRN Wilburt Finlay, PA   4 mg at 07/02/11 0433  . pantoprazole (PROTONIX) EC tablet 40 mg  40 mg Oral Daily Wilburt Finlay, PA   40 mg at 07/02/11 1043  . torsemide (DEMADEX) tablet 20 mg  20 mg Oral Daily Wilburt Finlay, PA   20 mg at 07/02/11 1038  . verapamil (CALAN-SR) CR tablet 180 mg  180 mg Oral Daily Wilburt Finlay, PA   180 mg at 07/02/11 1043  . DISCONTD: verapamil (CALAN) tablet 180 mg  180 mg Oral Daily Wilburt Finlay, PA   180 mg at 07/01/11 2359    PE: General appearance: alert, cooperative and no distress Neck: No JVD Lungs: clear to auscultation bilaterally Heart: regular rate and rhythm and 2/6 sys MM Extremities: Nonpitting LEE Pulses: Radials 2+ and  symmetric, 1+ DPs  Lab Results:   Basename 07/02/11 0509 07/01/11 1453 07/01/11 1255  WBC 7.9 6.5 6.5  HGB 10.0* 11.6* 11.2*  HCT 31.7* 36.3 36.3*  PLT 282 318 --   BMET  Basename 07/02/11 0509 07/01/11 1453  NA 134* 136  K 3.6 3.6  CL 95* 96  CO2 27 28  GLUCOSE 119* 118*  BUN 22 20  CREATININE 1.10 1.04  CALCIUM 9.0 9.7   PT/INR  Basename 07/01/11 1812 07/01/11 1253  LABPROT 14.9 14.3  INR 1.15 1.07    Assessment/Plan   Principal Problem:  *Pericardial effusion, Large Active Problems:  Back pain, Left Flank  Atrial fibrillation  Chronic anticoagulation  HTN (hypertension)  Pacemaker  Plan:  Doing better today.  2D Echo complete and pending read.  She was supposed to restart coumadin today for chronic afib.  Recent GIB.  HGB is decreased today from 11.6 to 10.0.  Recheck in AM.  Guiac stools.  ? Need for pericardiocentesis.  She does not appear to be affected by it.   LOS: 1 day    HAGER, BRYAN 07/02/2011 11:05 AM   Agree with note written by Jones Skene PAC  No CP/SOB. Results of echo noted. Mild to moderate pericardial effusion , more posteriorly, without hemodynamic compromise.  Major complaint was back pain which she no longer has. She was on coumadin A/C for CVA and Afib. Was hospitalized last week for GIB and was transfused. Told to restart coumadin in a week. Exam benign. Soft outflow murmur. Mild increase in JVP and mild B:LE edema. At this point I see no reason to perform pericardiocentesis especially since there aren't any safe approaches to do this given it's location. Will need to discuss with Dr. Matthias Hughs safety of restarting coumadin in light of recent GIB. INR was 1.1 on admission and HGB 11.6 which has dropped to 10.  Runell Gess 07/02/2011 5:12 PM

## 2011-07-03 LAB — SEDIMENTATION RATE: Sed Rate: 35 mm/hr — ABNORMAL HIGH (ref 0–22)

## 2011-07-03 LAB — CBC
MCH: 28.3 pg (ref 26.0–34.0)
MCHC: 32.5 g/dL (ref 30.0–36.0)
Platelets: 242 10*3/uL (ref 150–400)
RBC: 3.32 MIL/uL — ABNORMAL LOW (ref 3.87–5.11)
RDW: 14.5 % (ref 11.5–15.5)

## 2011-07-03 MED ORDER — DIPHENHYDRAMINE HCL 50 MG PO CAPS
50.0000 mg | ORAL_CAPSULE | Freq: Once | ORAL | Status: DC
  Filled 2011-07-03: qty 1

## 2011-07-03 MED ORDER — DIPHENHYDRAMINE HCL 50 MG PO CAPS
50.0000 mg | ORAL_CAPSULE | Freq: Once | ORAL | Status: AC
  Administered 2011-07-03: 50 mg via ORAL
  Filled 2011-07-03: qty 1

## 2011-07-03 MED ORDER — PREDNISONE 50 MG PO TABS
50.0000 mg | ORAL_TABLET | Freq: Four times a day (QID) | ORAL | Status: AC
  Administered 2011-07-03 – 2011-07-04 (×3): 50 mg via ORAL
  Filled 2011-07-03 (×3): qty 1

## 2011-07-03 NOTE — Progress Notes (Signed)
[facility]  DAILY PROGRESS NOTE   Subjective:  Was pain free most of day yesterday, but colicky flank pain returned this AM. Analgetics do calm pain in 20-30 minutes.  Echo confirms that pericardial effusion is much larger than before, but there are no signs of tamponade and there is little fluid anteriorly for a pericardiocentesis. If a true emergency, there is a apical pocket that might be approached with ultrasound guidance.  Had normal solid brown stools, hemoccult negative, but Hgb has dropped further to 9.4.  Objective:  Temp:  [98.5 F (36.9 C)-98.9 F (37.2 C)] 98.5 F (36.9 C) (06/07 0511) Pulse Rate:  [63-79] 79  (06/07 0949) Resp:  [18-20] 18  (06/07 0511) BP: (103-145)/(50-84) 103/50 mmHg (06/07 0949) SpO2:  [90 %-93 %] 91 % (06/07 0511) Weight:  [74.435 kg (164 lb 1.6 oz)] 74.435 kg (164 lb 1.6 oz) (06/07 0511) Weight change: 1.335 kg (2 lb 15.1 oz)  Intake/Output from previous day: 06/06 0701 - 06/07 0700 In: 1200 [P.O.:1200] Out: 1100 [Urine:1100]  Intake/Output from this shift: Total I/O In: 480 [P.O.:480] Out: 101 [Urine:100; Stool:1]  Medications: Current Facility-Administered Medications  Medication Dose Route Frequency Provider Last Rate Last Dose  . codeine tablet 15 mg  15 mg Oral Q6H PRN Wilburt Finlay, PA      . digoxin (LANOXIN) tablet 125 mcg  125 mcg Oral Daily Wilburt Finlay, PA   125 mcg at 07/03/11 0949  . diphenhydrAMINE (BENADRYL) capsule 50 mg  50 mg Oral Once Wenonah Milo, MD      . morphine 4 MG/ML injection 4 mg  4 mg Intravenous Q4H PRN Wilburt Finlay, PA   4 mg at 07/03/11 0902  . pantoprazole (PROTONIX) EC tablet 40 mg  40 mg Oral Daily Wilburt Finlay, PA   40 mg at 07/03/11 0900  . predniSONE (DELTASONE) tablet 50 mg  50 mg Oral Q6H Wilburt Finlay, PA      . torsemide (DEMADEX) tablet 20 mg  20 mg Oral Daily Wilburt Finlay, PA   20 mg at 07/03/11 0949  . verapamil (CALAN-SR) CR tablet 180 mg  180 mg Oral Daily Wilburt Finlay, PA   180 mg at 07/03/11 0949  . DISCONTD: diphenhydrAMINE (BENADRYL) capsule 50 mg  50 mg Oral Once Wilburt Finlay, PA      . DISCONTD: diphenhydrAMINE (BENADRYL) capsule 50 mg  50 mg Oral Once Wilburt Finlay, PA        Physical Exam: Constitutional: She is oriented to person, place, and time. She appears well-developed and well-nourished. She appears distressed.  Head: Normocephalic and atraumatic.  Eyes: EOM are normal. Pupils are equal, round, and reactive to light. No scleral icterus.  Neck: Normal range of motion. Neck supple.  Cardiovascular: Normal rate and regular rhythm.  Murmur heard.  Systolic murmur is present with a grade of 2/6  Pulses: Radial pulses are 2+ on the right side, and 2+ on the left side.  Dorsalis pedis pulses are 1+ on the right side, and 1+ on the left side.  No carotid bruit  Respiratory: Effort normal and breath sounds normal. She has no wheezes. She has no rales.  GI: Soft. Bowel sounds are normal.  Musculoskeletal: She exhibits edema. Tenderness: Left flank.  Nonpitting lower extremity edema  Lymphadenopathy:  She has no cervical adenopathy.  Neurological: She is alert and oriented to person, place, and time. She exhibits normal muscle tone.  Skin: Skin is warm and dry.  Psychiatric: She  has a normal mood and affect.   Lab Results: Results for orders placed during the hospital encounter of 07/01/11 (from the past 48 hour(s))  CBC     Status: Abnormal   Collection Time   07/01/11  2:53 PM      Component Value Range Comment   WBC 6.5  4.0 - 10.5 (K/uL)    RBC 4.15  3.87 - 5.11 (MIL/uL)    Hemoglobin 11.6 (*) 12.0 - 15.0 (g/dL)    HCT 16.1  09.6 - 04.5 (%)    MCV 87.5  78.0 - 100.0 (fL)    MCH 28.0  26.0 - 34.0 (pg)    MCHC 32.0  30.0 - 36.0 (g/dL)    RDW 40.9  81.1 - 91.4 (%)    Platelets 318  150 - 400 (K/uL)   DIFFERENTIAL     Status: Abnormal   Collection Time   07/01/11  2:53 PM      Component Value Range Comment   Neutrophils Relative 64   43 - 77 (%)    Neutro Abs 4.2  1.7 - 7.7 (K/uL)    Lymphocytes Relative 19  12 - 46 (%)    Lymphs Abs 1.3  0.7 - 4.0 (K/uL)    Monocytes Relative 13 (*) 3 - 12 (%)    Monocytes Absolute 0.9  0.1 - 1.0 (K/uL)    Eosinophils Relative 3  0 - 5 (%)    Eosinophils Absolute 0.2  0.0 - 0.7 (K/uL)    Basophils Relative 1  0 - 1 (%)    Basophils Absolute 0.0  0.0 - 0.1 (K/uL)   COMPREHENSIVE METABOLIC PANEL     Status: Abnormal   Collection Time   07/01/11  2:53 PM      Component Value Range Comment   Sodium 136  135 - 145 (mEq/L)    Potassium 3.6  3.5 - 5.1 (mEq/L)    Chloride 96  96 - 112 (mEq/L)    CO2 28  19 - 32 (mEq/L)    Glucose, Bld 118 (*) 70 - 99 (mg/dL)    BUN 20  6 - 23 (mg/dL)    Creatinine, Ser 7.82  0.50 - 1.10 (mg/dL)    Calcium 9.7  8.4 - 10.5 (mg/dL)    Total Protein 7.3  6.0 - 8.3 (g/dL)    Albumin 4.0  3.5 - 5.2 (g/dL)    AST 24  0 - 37 (U/L)    ALT 15  0 - 35 (U/L)    Alkaline Phosphatase 94  39 - 117 (U/L)    Total Bilirubin 0.7  0.3 - 1.2 (mg/dL)    GFR calc non Af Amer 47 (*) >90 (mL/min)    GFR calc Af Amer 55 (*) >90 (mL/min)   PROTIME-INR     Status: Normal   Collection Time   07/01/11  6:12 PM      Component Value Range Comment   Prothrombin Time 14.9  11.6 - 15.2 (seconds)    INR 1.15  0.00 - 1.49    URINALYSIS, ROUTINE W REFLEX MICROSCOPIC     Status: Abnormal   Collection Time   07/02/11  4:43 AM      Component Value Range Comment   Color, Urine YELLOW  YELLOW     APPearance CLEAR  CLEAR     Specific Gravity, Urine 1.012  1.005 - 1.030     pH 5.5  5.0 - 8.0     Glucose, UA 100 (*)  NEGATIVE (mg/dL)    Hgb urine dipstick NEGATIVE  NEGATIVE     Bilirubin Urine NEGATIVE  NEGATIVE     Ketones, ur NEGATIVE  NEGATIVE (mg/dL)    Protein, ur NEGATIVE  NEGATIVE (mg/dL)    Urobilinogen, UA 0.2  0.0 - 1.0 (mg/dL)    Nitrite NEGATIVE  NEGATIVE     Leukocytes, UA SMALL (*) NEGATIVE    URINE MICROSCOPIC-ADD ON     Status: Abnormal   Collection Time   07/02/11   4:43 AM      Component Value Range Comment   Squamous Epithelial / LPF RARE  RARE     WBC, UA 3-6  <3 (WBC/hpf)    RBC / HPF 0-2  <3 (RBC/hpf)    Bacteria, UA RARE  RARE     Casts HYALINE CASTS (*) NEGATIVE    BASIC METABOLIC PANEL     Status: Abnormal   Collection Time   07/02/11  5:09 AM      Component Value Range Comment   Sodium 134 (*) 135 - 145 (mEq/L)    Potassium 3.6  3.5 - 5.1 (mEq/L)    Chloride 95 (*) 96 - 112 (mEq/L)    CO2 27  19 - 32 (mEq/L)    Glucose, Bld 119 (*) 70 - 99 (mg/dL)    BUN 22  6 - 23 (mg/dL)    Creatinine, Ser 1.61  0.50 - 1.10 (mg/dL)    Calcium 9.0  8.4 - 10.5 (mg/dL)    GFR calc non Af Amer 44 (*) >90 (mL/min)    GFR calc Af Amer 51 (*) >90 (mL/min)   CBC     Status: Abnormal   Collection Time   07/02/11  5:09 AM      Component Value Range Comment   WBC 7.9  4.0 - 10.5 (K/uL)    RBC 3.65 (*) 3.87 - 5.11 (MIL/uL)    Hemoglobin 10.0 (*) 12.0 - 15.0 (g/dL)    HCT 09.6 (*) 04.5 - 46.0 (%)    MCV 86.8  78.0 - 100.0 (fL)    MCH 27.4  26.0 - 34.0 (pg)    MCHC 31.5  30.0 - 36.0 (g/dL)    RDW 40.9  81.1 - 91.4 (%)    Platelets 282  150 - 400 (K/uL)   CBC     Status: Abnormal   Collection Time   07/03/11  6:15 AM      Component Value Range Comment   WBC 7.4  4.0 - 10.5 (K/uL)    RBC 3.32 (*) 3.87 - 5.11 (MIL/uL)    Hemoglobin 9.4 (*) 12.0 - 15.0 (g/dL)    HCT 78.2 (*) 95.6 - 46.0 (%)    MCV 87.0  78.0 - 100.0 (fL)    MCH 28.3  26.0 - 34.0 (pg)    MCHC 32.5  30.0 - 36.0 (g/dL)    RDW 21.3  08.6 - 57.8 (%)    Platelets 242  150 - 400 (K/uL)   SEDIMENTATION RATE     Status: Abnormal   Collection Time   07/03/11 11:10 AM      Component Value Range Comment   Sed Rate 35 (*) 0 - 22 (mm/hr)     Imaging: Dg Chest 2 View  07/01/2011  *RADIOLOGY REPORT*  Clinical Data: Follow-up pericardial effusion.  CHEST - 2 VIEW  Comparison: None.  Findings: Enlargement of the cardiopericardial silhouette.  No pulmonary edema.  No focal opacities or effusion.   Peribronchial thickening  present.  No acute bony abnormality.  Right pacer is in place with leads in the right base and right ventricle.  IMPRESSION: Enlarged cardiopericardial silhouette.  Bronchitic changes.  Clinically significant discrepancy from primary report, if provided: None  Original Report Authenticated By: Cyndie Chime, M.D.    Assessment:  1. Principal Problem: 2.  *Pericardial effusion, Large 3. Active Problems: 4.  Atrial fibrillation 5.  Chronic anticoagulation 6.  HTN (hypertension) 7.  Pacemaker 8.  Back pain, Left Flank 9.   Plan:  1. Pericardiocentesis risk outweighs benefits in this asymptomatic patient, but we can proceed with noninvasive evaluation (CT chest,immunological labs). Needs premeds for CT due to contrast allergy. 2. Continue to monitor Hgb, especially as we are resuming heparin (due to AF and recent CVA, high risk of recurrent embolism). 3. Flank pain seems most compatible with nephrolithiasis/renal colic, but UA is unremarkable and CT abdomen does not demonstrate a ureteral stone.  Possibly dc tomorrow if workup complete, Hgb stable and pain controlled. If so, repeat echo in about 3 weeks.  Length of Stay:  LOS: 2 days    Artina Minella 07/03/2011, 12:57 PM

## 2011-07-04 ENCOUNTER — Inpatient Hospital Stay (HOSPITAL_COMMUNITY): Payer: Medicare Other

## 2011-07-04 ENCOUNTER — Encounter (HOSPITAL_COMMUNITY): Payer: Self-pay | Admitting: Radiology

## 2011-07-04 MED ORDER — SPIRONOLACTONE 12.5 MG HALF TABLET
12.5000 mg | ORAL_TABLET | Freq: Two times a day (BID) | ORAL | Status: DC
  Administered 2011-07-04 – 2011-07-05 (×2): 12.5 mg via ORAL
  Filled 2011-07-04 (×4): qty 1

## 2011-07-04 MED ORDER — IOHEXOL 300 MG/ML  SOLN
80.0000 mL | Freq: Once | INTRAMUSCULAR | Status: AC | PRN
  Administered 2011-07-04: 80 mL via INTRAVENOUS

## 2011-07-04 NOTE — Progress Notes (Signed)
[facility]  Subjective: Back pain comes and goes with morphine.   Objective: Vital signs in last 24 hours: Temp:  [97.7 F (36.5 C)-98.3 F (36.8 C)] 98.3 F (36.8 C) (06/08 0636) Pulse Rate:  [60-67] 60  (06/08 0636) Resp:  [18-20] 18  (06/08 0636) BP: (113-118)/(55-65) 113/61 mmHg (06/08 0636) SpO2:  [90 %-93 %] 93 % (06/08 0636) Weight:  [74.844 kg (165 lb)] 74.844 kg (165 lb) (06/08 0636) Last BM Date: 07/01/11  Intake/Output from previous day: 06/07 0701 - 06/08 0700 In: 1560 [P.O.:1560] Out: 1151 [Urine:1150; Stool:1] Intake/Output this shift: Total I/O In: 220 [P.O.:220] Out: -   Medications Current Facility-Administered Medications  Medication Dose Route Frequency Provider Last Rate Last Dose  . codeine tablet 15 mg  15 mg Oral Q6H PRN Wilburt Finlay, PA      . digoxin (LANOXIN) tablet 125 mcg  125 mcg Oral Daily Wilburt Finlay, PA   125 mcg at 07/04/11 1008  . diphenhydrAMINE (BENADRYL) capsule 50 mg  50 mg Oral Once Thurmon Fair, MD   50 mg at 07/03/11 1341  . iohexol (OMNIPAQUE) 300 MG/ML solution 80 mL  80 mL Intravenous Once PRN Medication Radiologist, MD   80 mL at 07/04/11 0312  . morphine 4 MG/ML injection 4 mg  4 mg Intravenous Q4H PRN Wilburt Finlay, PA   4 mg at 07/04/11 1016  . pantoprazole (PROTONIX) EC tablet 40 mg  40 mg Oral Daily Wilburt Finlay, PA   40 mg at 07/04/11 1000  . predniSONE (DELTASONE) tablet 50 mg  50 mg Oral Q6H Wilburt Finlay, PA   50 mg at 07/04/11 0144  . torsemide (DEMADEX) tablet 20 mg  20 mg Oral Daily Wilburt Finlay, PA   20 mg at 07/04/11 1008  . verapamil (CALAN-SR) CR tablet 180 mg  180 mg Oral Daily Wilburt Finlay, PA   180 mg at 07/04/11 1008  . DISCONTD: diphenhydrAMINE (BENADRYL) capsule 50 mg  50 mg Oral Once Wilburt Finlay, PA      . DISCONTD: diphenhydrAMINE (BENADRYL) capsule 50 mg  50 mg Oral Once Wilburt Finlay, PA        PE: General appearance: alert, cooperative and no distress Lungs: clear to  auscultation bilaterally Heart: regular rate and rhythm 1/6 sys MM axillae  No rub Abdomen: soft, non-tender; bowel sounds normal; Extremities: 1+ Nonpitting edema Pulses: 2+ and symmetric Skin: Warm and dry.  Lab Results:   Basename 07/03/11 0615 07/02/11 0509 07/01/11 1453  WBC 7.4 7.9 6.5  HGB 9.4* 10.0* 11.6*  HCT 28.9* 31.7* 36.3  PLT 242 282 318   BMET  Basename 07/02/11 0509 07/01/11 1453  NA 134* 136  K 3.6 3.6  CL 95* 96  CO2 27 28  GLUCOSE 119* 118*  BUN 22 20  CREATININE 1.10 1.04  CALCIUM 9.0 9.7   PT/INR  Basename 07/01/11 1812 07/01/11 1253  LABPROT 14.9 14.3  INR 1.15 1.07   Cholesterol No results found for this basename: CHOL in the last 72 hours Cardiac Enzymes No components found with this basename: TROPONIN:3, CKMB:3  Studies/Results: CT CHEST WITH CONTRAST  Technique: Multidetector CT imaging of the chest was performed  following the standard protocol during bolus administration of  intravenous contrast.  Contrast: 80mL OMNIPAQUE IOHEXOL 300 MG/ML SOLN  Comparison: Abdominal CT 06/26/2011  Findings: There is mild nodularity of the thyroid tissue. There is  a moderate to large sized pericardial effusion that appears similar  to the prior  CT examination. There are small bilateral pleural  effusions which are new. Right pleural effusion is larger than the  left.  There are a few prominent lymph nodes in the mediastinum. Index  lymph node in the pre tracheal region on sequence #2, image 21  measures 1.2 cm in the short axis. There is no significant hilar  lymphadenopathy. The patient has a right cardiac pacemaker with  leads in the right atrium and right ventricle. Main pulmonary  artery measures up to 3.6 cm and ascending thoracic aorta roughly  measures 3.9 cm. There is a small amount of perihepatic ascites.  The gallbladder has been removed. Heterogeneous appearance of the  spleen may be related to the phase of contrast. There is edema    between the stomach and spleen. The liver appears to have a subtle  nodular contour and raises the possibility of cirrhosis. Low  density structure in the posterior left kidney may represent a cyst  but poorly visualized on this examination. There may also be edema  near the porta hepatis. Small amount of fluid along the descending  colon.  The trachea and mainstem bronchi are patent. Questionable 3 mm  nodule in the right lower lobe on sequence #3, image 31. There is  mild scarring or atelectasis in the lung bases. No focal  parenchymal airspace disease. No large suspicious lesions with the  lung parenchyma. Evaluation of the bones demonstrates osteopenia.  The thoracic vertebral bodies are intact.   IMPRESSION:  Increased fluid throughout the chest and abdomen. The patient now  has upper abdominal ascites and edema. The patient has also  developed small pleural effusions, right side greater than left.  Again seen is a moderate to large sized pericardial effusion.  The contour of the liver is slightly nodular and cannot exclude  cirrhosis.  Mild mediastinal lymphadenopathy. There is a 3 mm nodule in the  right lower lobe which is probably an incidental finding. No  suspicious findings within the chest to suggest malignancy.  Sed rate 35 C3 Comp: 124  Assessment/Plan  Principal Problem:  *Pericardial effusion, Large Active Problems:  Back pain, Left Flank  Atrial fibrillation  Chronic anticoagulation  HTN (hypertension)  Pacemaker  Plan: No suspicious findings within the chest to suggest malignancy on CT scan.  She does have upper abdominal ascites.  Rechecking H&H this AM.  Stool guaiac nega.  Sed rate elevated slightly.  ~1L to the positive since admission.  ? Increasing diuretic. May be abler to DC if Hgb stable.  Need to resume coumadin for afib soon.   LOS: 3 days    HAGER, BRYAN 07/04/2011 10:28 AM   Patient seen and examined. Agree with assessment and plan.  With ascities will add spironolactone to medical therapy. Possibly home tomorrow, ? Timing of reinstitution of anticoagulation.  Lennette Bihari, MD, [facility] 07/04/2011 11:15 AM

## 2011-07-05 MED ORDER — SPIRONOLACTONE 25 MG PO TABS
25.0000 mg | ORAL_TABLET | Freq: Two times a day (BID) | ORAL | Status: DC
  Filled 2011-07-05 (×2): qty 1

## 2011-07-05 MED ORDER — VERAPAMIL HCL ER 180 MG PO TBCR: 180.0000 mg | EXTENDED_RELEASE_TABLET | Freq: Every day | ORAL | Status: DC

## 2011-07-05 MED ORDER — CODEINE SULFATE 15 MG PO TABS: 15.0000 mg | ORAL_TABLET | Freq: Four times a day (QID) | ORAL | Status: AC | PRN

## 2011-07-05 MED ORDER — SPIRONOLACTONE 25 MG PO TABS: 25.0000 mg | ORAL_TABLET | Freq: Two times a day (BID) | ORAL | Status: DC

## 2011-07-05 NOTE — Discharge Summary (Signed)
Physician Discharge Summary  Patient ID: [name] MRN: [id number] DOB/AGE: 76-Jul-1925 76 y.o.  Admit date: 07/01/2011 Discharge date: 07/05/2011  Admission Diagnoses:  Pericardial effusion, Large; back pain.   Discharge Diagnoses:  Principal Problem:  *Pericardial effusion, Large Active Problems:  Back pain, Left Flank  Atrial fibrillation  Chronic anticoagulation  HTN (hypertension)  Pacemaker   Discharged Condition: stable  Hospital Course:   The patient is an 78 year old Caucasian female with history of permanent pacemaker, chronic atrial fibrillation and chronic anticoagulation on Coumadin, hypertension, mild aortic stenosis, moderate tricuspid insufficiency,chronic lower extremity edema, GI bleeding(Coumadin held recently and recent CVA. She was discharged from [facility] April 24th 2013 after suffering a CVA which affected her left side. Patient was last seen in our office on May 17 at which time she was making an improvement in strength. She was ambulating freely without walker. He did have some residual left-sided weakness.   Patient presented from Dr. Ellis Parents office with severe left flank pain which began 2 weeks ago, cardiomegaly on chest x-ray and large pericardial effusion. Patient states she has no other complaints. No nausea no vomiting, shortness of breath, chest pain, abdominal pain, dysuria, hematuria, hematochezia, cough, congestion, orthopnea.  She was admitted to investigate pericardial effusion.  Morphine given for MSK pain.  2D echo results below.  There was no significant change form Echo 05/17/11.  Ct of the chest was also completed(see below). It revealed t he patient now  has upper abdominal ascites and edema. The patient has also developed small pleural effusions, right side greater than left.  She was started on spironolactone which was titrated to 25mg  BID.  She was instructed to eliminate salt from her diet, which she apparently really enjoys, as well as  daily weights.  Her hgb decreased on admission from 11.6 to 9.4 then on the forth draw was 11.5.   She will restart coumadin for afib.  INR check on Thursday. FU echo in three weeks.     Consults: None  Significant Diagnostic Studies: CT CHEST WITH CONTRAST  Technique: Multidetector CT imaging of the chest was performed  following the standard protocol during bolus administration of  intravenous contrast.  Contrast: 80mL OMNIPAQUE IOHEXOL 300 MG/ML SOLN  Comparison: Abdominal CT 06/26/2011  Findings: There is mild nodularity of the thyroid tissue. There is  a moderate to large sized pericardial effusion that appears similar  to the prior CT examination. There are small bilateral pleural  effusions which are new. Right pleural effusion is larger than the  left.  There are a few prominent lymph nodes in the mediastinum. Index  lymph node in the pre tracheal region on sequence #2, image 21  measures 1.2 cm in the short axis. There is no significant hilar  lymphadenopathy. The patient has a right cardiac pacemaker with  leads in the right atrium and right ventricle. Main pulmonary  artery measures up to 3.6 cm and ascending thoracic aorta roughly  measures 3.9 cm. There is a small amount of perihepatic ascites.  The gallbladder has been removed. Heterogeneous appearance of the  spleen may be related to the phase of contrast. There is edema  between the stomach and spleen. The liver appears to have a subtle  nodular contour and raises the possibility of cirrhosis. Low  density structure in the posterior left kidney may represent a cyst  but poorly visualized on this examination. There may also be edema  near the porta hepatis. Small amount of fluid along the  descending  colon.  The trachea and mainstem bronchi are patent. Questionable 3 mm  nodule in the right lower lobe on sequence #3, image 31. There is  mild scarring or atelectasis in the lung bases. No focal  parenchymal airspace  disease. No large suspicious lesions with the  lung parenchyma. Evaluation of the bones demonstrates osteopenia.  The thoracic vertebral bodies are intact.  IMPRESSION:  Increased fluid throughout the chest and abdomen. The patient now  has upper abdominal ascites and edema. The patient has also  developed small pleural effusions, right side greater than left.  Again seen is a moderate to large sized pericardial effusion.  The contour of the liver is slightly nodular and cannot exclude  cirrhosis.  Mild mediastinal lymphadenopathy. There is a 3 mm nodule in the  right lower lobe which is probably an incidental finding. No  suspicious findings within the chest to suggest malignancy.   Study Conclusions  - Left ventricle: The cavity size was normal. Wall thickness was increased in a pattern of moderate LVH. Systolic function was normal. The estimated ejection fraction was in the range of 55% to 65%. Wall motion was normal; there were no regional wall motion abnormalities. Doppler parameters are consistent with abnormal left ventricular relaxation (grade 1 diastolic dysfunction). - Aortic valve: Mildly to moderately calcified annulus. Trileaflet; moderately calcified leaflets. - Mitral valve: Calcified annulus. Mild regurgitation. - Atrial septum: No defect or patent foramen ovale was identified. - Tricuspid valve: Moderate regurgitation. - Pericardium, extracardiac: A small to moderate circumferential pericardial effusion was identified deing moderate posteriorly measuring 1.91 cm.. The fluid had no internal echoes. There was no evidence of hemodynamic compromise. No significant change from 4/21 echo.   Treatments: Spironolactone  Discharge Exam: Blood pressure 98/46, pulse 70, temperature 98.1 F (36.7 C), temperature source Oral, resp. rate 18, height 5\' 5"  (1.651 m), weight 70.3 kg (154 lb 15.7 oz), SpO2 95.00%.   Disposition: 01-Home or Self Care  Discharge Orders     Future Appointments: Provider: Department: Dept Phone: Center:   07/10/2011 10:00 AM [facility] [phone] None   07/10/2011 11:00 AM Erick Colace, MD [facility] [phone] None     Future Orders Please Complete By Expires   Diet - low sodium heart healthy      Increase activity slowly      Discharge instructions      Comments:   No Salt and daily weights.     Medication List  As of 07/05/2011 10:42 AM   STOP taking these medications         metolazone 2.5 MG tablet      traZODone 50 MG tablet      verapamil 240 MG (CO) 24 hr tablet         TAKE these medications         codeine 15 MG tablet   Take 1 tablet (15 mg total) by mouth every 6 (six) hours as needed.      digoxin 0.125 MG tablet   Commonly known as: LANOXIN   Take 125 mcg by mouth daily.      multivitamin per tablet   Take 1 tablet by mouth daily.      pantoprazole 40 MG tablet   Commonly known as: PROTONIX   Take 40 mg by mouth daily.      spironolactone 25 MG tablet   Commonly known as: ALDACTONE   Take 1 tablet (25 mg total) by mouth 2 (  two) times daily.      torsemide 20 MG tablet   Commonly known as: DEMADEX   Take 20 mg by mouth daily.      verapamil 180 MG CR tablet   Commonly known as: CALAN-SR   Take 1 tablet (180 mg total) by mouth daily.      warfarin 5 MG tablet   Commonly known as: COUMADIN   Take 5 mg by mouth daily. Was taking 5 mg qd for six days a week and 2.5 mg 1 day. Coumadin was held during hospital stay. Starts back today.             SignedWilburt Finlay 07/05/2011, 10:42 AM

## 2011-07-05 NOTE — Progress Notes (Signed)
[facility]  Subjective: No SOB, CP.  Continue intermittent back pain.  Objective: Vital signs in last 24 hours: Temp:  [98.1 F (36.7 C)-98.7 F (37.1 C)] 98.1 F (36.7 C) (06/09 0412) Pulse Rate:  [61-86] 70  (06/09 0412) Resp:  [18] 18  (06/09 0412) BP: (98-126)/(46-59) 98/46 mmHg (06/09 0412) SpO2:  [95 %-97 %] 95 % (06/09 0412) Weight:  [70.3 kg (154 lb 15.7 oz)] 70.3 kg (154 lb 15.7 oz) (06/09 0412) Last BM Date: 07/01/11  Intake/Output from previous day: 06/08 0701 - 06/09 0700 In: 1120 [P.O.:1120] Out: 1351 [Urine:1350; Stool:1] Intake/Output this shift:    Medications Current Facility-Administered Medications  Medication Dose Route Frequency Provider Last Rate Last Dose  . codeine tablet 15 mg  15 mg Oral Q6H PRN Wilburt Finlay, PA      . digoxin (LANOXIN) tablet 125 mcg  125 mcg Oral Daily Wilburt Finlay, PA   125 mcg at 07/04/11 1008  . morphine 4 MG/ML injection 4 mg  4 mg Intravenous Q4H PRN Wilburt Finlay, PA   4 mg at 07/04/11 1016  . pantoprazole (PROTONIX) EC tablet 40 mg  40 mg Oral Daily Wilburt Finlay, PA   40 mg at 07/04/11 1000  . spironolactone (ALDACTONE) tablet 12.5 mg  12.5 mg Oral BID Lennette Bihari, MD   12.5 mg at 07/04/11 1719  . torsemide (DEMADEX) tablet 20 mg  20 mg Oral Daily Wilburt Finlay, PA   20 mg at 07/04/11 1008  . verapamil (CALAN-SR) CR tablet 180 mg  180 mg Oral Daily Wilburt Finlay, PA   180 mg at 07/04/11 1008    PE: General appearance: alert, cooperative and no distress Lungs: clear to auscultation bilaterally Heart: regular rate and rhythm, S1, S2 normal, no murmur, click, rub or gallop Extremities: 2+ LEE Pulses: 2+ and symmetric Neurologic: Grossly normal  Lab Results:   Basename 07/04/11 1134 07/03/11 0615  WBC -- 7.4  HGB 11.5* 9.4*  HCT 34.8* 28.9*  PLT -- 242    Assessment/Plan  Principal Problem:  *Pericardial effusion, Large Active Problems:  Back pain, Left Flank  Atrial fibrillation  Chronic anticoagulation  HTN (hypertension)  Pacemaker   Plan:  Hgb has dramatically improved.  Back pain is clearly MSK.  Very tender to slight palpation.  Spironolactone added yesterday.  ? Adding low dose lasix short term.  I think she can be DCd today.  I discussed daily weights and no sodium.  She apparently likes and uses salt a lot.  We should restart coumadin and follow Hgb/INR.  DC with PRN pain med.   LOS: 4 days    HAGER, BRYAN 07/05/2011 9:01 AM   Patient seen and examined. Agree with assessment and plan. Clinically feels better; ready to go home.  Tolerated initiation of low dose spironolactone; will increase to 25 mg bid.  Plan dc later with f/u with Dr Royann Shivers.  Lennette Bihari, MD, [facility] 07/05/2011 9:40 AM

## 2011-07-06 ENCOUNTER — Ambulatory Visit: Payer: Medicare Other | Admitting: Physical Therapy

## 2011-07-06 LAB — ANA: Anti Nuclear Antibody(ANA): NEGATIVE

## 2011-07-08 ENCOUNTER — Ambulatory Visit: Payer: Medicare Other | Admitting: Physical Therapy

## 2011-07-08 ENCOUNTER — Encounter: Payer: Self-pay | Admitting: Emergency Medicine

## 2011-07-10 ENCOUNTER — Encounter: Payer: Medicare Other | Attending: Physical Medicine & Rehabilitation

## 2011-07-10 ENCOUNTER — Encounter: Payer: Self-pay | Admitting: Physical Medicine & Rehabilitation

## 2011-07-10 ENCOUNTER — Ambulatory Visit (HOSPITAL_BASED_OUTPATIENT_CLINIC_OR_DEPARTMENT_OTHER): Payer: Medicare Other | Admitting: Physical Medicine & Rehabilitation

## 2011-07-10 VITALS — BP 125/54 | HR 72 | Resp 16 | Ht 65.0 in | Wt 163.4 lb

## 2011-07-10 DIAGNOSIS — R29898 Other symptoms and signs involving the musculoskeletal system: Secondary | ICD-10-CM | POA: Insufficient documentation

## 2011-07-10 DIAGNOSIS — R279 Unspecified lack of coordination: Secondary | ICD-10-CM | POA: Insufficient documentation

## 2011-07-10 DIAGNOSIS — I69998 Other sequelae following unspecified cerebrovascular disease: Secondary | ICD-10-CM | POA: Insufficient documentation

## 2011-07-10 DIAGNOSIS — Z7901 Long term (current) use of anticoagulants: Secondary | ICD-10-CM | POA: Insufficient documentation

## 2011-07-10 DIAGNOSIS — M76899 Other specified enthesopathies of unspecified lower limb, excluding foot: Secondary | ICD-10-CM

## 2011-07-10 DIAGNOSIS — M7070 Other bursitis of hip, unspecified hip: Secondary | ICD-10-CM

## 2011-07-10 DIAGNOSIS — Z950 Presence of cardiac pacemaker: Secondary | ICD-10-CM | POA: Insufficient documentation

## 2011-07-10 NOTE — Patient Instructions (Signed)
Hamstring Strain with Rehab The hamstring muscle and tendons are vulnerable to muscle or tendon tear (strain). Hamstring tears cause pain and inflammation in the backside of the thigh, where the hamstring muscles are located. The hamstring is comprised of three muscles that are responsible for straightening the hip, bending the knee, and stabilizing the knee. These muscles are important for walking, running, and jumping. Hamstring strain is the most common injury of the thigh. Hamstring strains are classified as grade 1 or 2 strains. Grade 1 strains cause pain, but the tendon is not lengthened. Grade 2 strains include a lengthened ligament due to the ligament being stretched or partially ruptured. With grade 2 strains there is still function, although the function may be decreased.  SYMPTOMS   Pain, tenderness, swelling, warmth, or redness over the hamstring muscles, at the back of the thigh.   Pain that gets worse during and after intense activity.   A "pop" heard in the area, at the time of injury.   Muscle spasm in the hamstring muscles.   Pain or weakness with running, jumping, or bending the knee against resistance.   Crackling sound (crepitation) when the tendon is moved or touched.   Bruising (contusion) in the thigh within 48 hours of injury.   Loss of fullness of the muscle, or area of muscle bulging in the case of a complete rupture.  CAUSES  A muscle strain occurs when a force is placed on the muscle or tendon that is greater than it can withstand. Common causes of injury include:  Strain from overuse or sudden increase in the frequency, duration, or intensity of activity.   Single violent blow or force to the back of the knee or the hamstring area of the thigh.  RISK INCREASES WITH:  Sports that require quick starts (sprinting, racquetball, tennis).   Sports that require jumping (basketball, volleyball).   Kicking sports and water skiing.   Contact sports (soccer,  football).   Poor strength and flexibility.   Failure to warm up properly before activity.   Previous thigh, knee, or pelvis injury.   Poor exercise technique.   Poor posture.  PREVENTION  Maintain physical fitness:   Strength, flexibility, and endurance.   Cardiovascular fitness.   Learn and use proper exercise technique and posture.   Wear proper fitted and padded protective equipment.  PROGNOSIS  If treated properly, hamstring strains are usually curable in 2 to 6 weeks. RELATED COMPLICATIONS   Longer healing time, if not properly treated or if not given adequate time to heal.   Chronically inflamed tendon, causing persistent pain with activity that may progress to constant pain.   Recurring symptoms, if activity is resumed too soon.   Vulnerable to repeated injury (in up to 25% of cases).  TREATMENT  Treatment first involves the use of ice and medication to help reduce pain and inflammation. It is also important to complete strengthening and stretching exercises, as well as modifying any activities that aggravate the symptoms. These exercises may be completed at home or with a therapist. Your caregiver may recommend the use of crutches to help reduce pain and discomfort, especially is the strain is severe enough to cause limping. If the tendon has pulled away from the bone, then surgery is necessary to reattach it. MEDICATION   If pain medicine is needed, nonsteroidal anti-inflammatory medicines (aspirin and ibuprofen), or other minor pain relievers (acetaminophen), are often advised.   Do not take pain medicine for 7 days before surgery.     Prescription pain relievers may be given if your caregiver thinks they are needed. Use only as directed and only as much as you need.   Corticosteroid injections may be recommended. However, these injections should only be used for serious cases, as they can only be given a certain number of times.   Ointments applied to the skin  may be beneficial.  HEAT AND COLD  Cold treatment (icing) relieves pain and reduces inflammation. Cold treatment should be applied for 10 to 15 minutes every 2 to 3 hours, and immediately after activity that aggravates your symptoms. Use ice packs or an ice massage.   Heat treatment may be used before performing the stretching and strengthening activities prescribed by your caregiver, physical therapist, or athletic trainer. Use a heat pack or a warm water soak.  SEEK MEDICAL CARE IF:   Symptoms get worse or do not improve in 2 weeks, despite treatment.   New, unexplained symptoms develop. (Drugs used in treatment may produce side effects.)  EXERCISES RANGE OF MOTION (ROM) AND STRETCHING EXERCISES - Hamstring Strain These exercises may help you when beginning to rehabilitate your injury. Your symptoms may go away with or without further involvement from your physician, physical therapist or athletic trainer. While completing these exercises, remember:   Restoring tissue flexibility helps normal motion to return to the joints. This allows healthier, less painful movement and activity.   An effective stretch should be held for at least 30 seconds.   A stretch should never be painful. You should only feel a gentle lengthening or release in the stretched tissue.  STRETCH - Hamstrings, Standing  Stand or sit, and extend your right / left leg, placing your foot on a chair or foot stool.   Keep a slight arch in your low back and your hips straight forward.   Lead with your chest, and lean forward at the waist until you feel a gentle stretch in the back of your right / left knee or thigh. (When done correctly, this exercise requires leaning only a small distance.)   Hold this position for __________ seconds.  Repeat __________ times. Complete this stretch __________ times per day. STRETCH - Hamstrings, Supine   Lie on your back. Loop a belt or towel over the ball of your right / left foot.     Straighten your right / left knee and slowly pull on the belt to raise your leg. Do not allow the right / left knee to bend. Keep your opposite leg flat on the floor.   Raise the leg until you feel a gentle stretch behind your right / left knee or thigh. Hold this position for __________ seconds.  Repeat __________ times. Complete this stretch __________ times per day.  STRETCH - Hamstrings, Doorway  Lie on your back with your right / left leg extended and resting on the wall, and the opposite leg flat on the ground through the door. Initially, position your bottom farther away from the wall.   Keep your right / left knee straight. If you feel a stretch behind your knee or thigh, hold this position for __________ seconds.   If you do not feel a stretch, scoot your bottom closer to the door and hold __________ seconds.  Repeat __________ times. Complete this stretch __________ times per day.  STRETCH - Hamstrings/Adductors, V-Sit   Sit on the floor with your legs extended in a large "V," keeping your knees straight.   With your head and chest upright, bend at   your waist reaching for your left foot to stretch your right thigh muscles.   You should feel a stretch in your right inner thigh. Hold for __________ seconds.   Return to the upright position to relax your leg muscles.   Continuing to keep your chest upright, bend straight forward at your waist to stretch your hamstrings.   You should feel a stretch behind both of your thighs and knees. Hold for __________ seconds.   Return to the upright position to relax your leg muscles.   With your head and chest upright, bend at your waist reaching for your right foot to stretch your left thigh muscles.   You should feel a stretch in your left inner thigh. Hold for __________ seconds.   Return to the upright position to relax your leg muscles.  Repeat __________ times. Complete this exercise __________ times per day.  STRENGTHENING  EXERCISES - Hamstring Strain These exercises may help you when beginning to rehabilitate your injury. They may resolve your symptoms with or without further involvement from your physician, physical therapist or athletic trainer. While completing these exercises, remember:   Muscles can gain both the endurance and the strength needed for everyday activities through controlled exercises.   Complete these exercises as instructed by your physician, physical therapist or athletic trainer. Increase the resistance and repetitions only as guided.   You may experience muscle soreness or fatigue, but the pain or discomfort you are trying to eliminate should never get worse during these exercises. If this pain does get worse, stop and make certain you are following the directions exactly. If the pain is still present after adjustments, discontinue the exercise until you can discuss the trouble with your clinician.  STRENGTH - Hip Extensors, Straight Leg Raises   Lie on your stomach on a firm surface.   Tense the muscles in your buttocks to lift your right / left leg about 4 inches. If you cannot lift your leg this high without arching your back, place a pillow under your hips.   Keep your knee straight. Hold for __________ seconds.   Slowly lower your leg to the starting position and allow it to relax completely before starting the next repetition.  Repeat __________ times. Complete this exercise __________ times per day.  STRENGTH - Hamstring, Isometrics   Lie on your back on a firm surface.   Bend your right / left knee approximately __________ degrees.   Dig your heel into the surface, as if you are trying to pull it toward your buttocks. Tighten the muscles in the back of your thighs to "dig" as hard as you can, without increasing any pain.   Hold this position for __________ seconds.   Release the tension gradually and allow your muscles to completely relax for __________ seconds between each  exercise.  Repeat __________ times. Complete this exercise __________ times per day.  STRENGTH - Hamstring, Curls   Lay on your stomach with your legs extended. (If you lay on a bed, your feet may hang over the edge.)   Tighten the muscles in the back of your thigh to bend your right / left knee up to 90 degrees. Keep your hips flat on the bed or floor.   Hold this position for __________ seconds.   Slowly lower your leg back to the starting position.  Repeat __________ times. Complete this exercise __________ times per day.  OPTIONAL ANKLE WEIGHTS: Begin with ____________________, but DO NOT exceed ____________________. Increase in 1 pound/0.5   kilogram increments. Document Released: 01/12/2005 Document Revised: 01/01/2011 Document Reviewed: 04/26/2008 [employer] Patient Information 2012 [employer]. 

## 2011-07-10 NOTE — Progress Notes (Signed)
Subjective:    Patient ID: [name], female    DOB: Sep 16, 1923, 78 y.o.   MRN: [id number]  HPI This is an 78 year old right-handed female with history of atrial  fibrillation, pacemaker, chronic Coumadin therapy who was admitted on  May 16, 2011, with left-sided weakness. Cranial CT scan showed small  acute nonhemorrhagic mid to posterior right frontal lobe infarction.  MRI could not be completed due to pacemaker. Carotid Dopplers with no  ICA stenosis. Echocardiogram with ejection fraction of 70% without  emboli. Neurology consulted and remained on Coumadin therapy with INR  1.71. Gastroenterology was consulted on May 19, 2011, for maroon  stool x4 without associated abdominal pain or nausea. There was also  reported nosebleed on May 14, 2011, that resolved on its own without  recurrence. Underwent an endoscopy on May 19, 2011, showing small  benign-appearing antral ulcerations without any bleeding. No blood  products or rectal bleeding seen. Suspect gastric findings unrelated to  her melena. Felt recent epistaxis could be the source of recent melena.  Placed on Protonix therapy  Main complaint is R buttocks pain with walking  I ADL and Mobility  Pain Inventory Average Pain 0 Pain Right Now 0 My pain is no pain  In the last 24 hours, has pain interfered with the following? General activity 0 Relation with others 0 Enjoyment of life 0 What TIME of day is your pain at its worst? no pain Sleep (in general) Poor Trazodone did not help at all--stopped taking it  Pain is worse with: no pain Pain improves with: no pain Relief from Meds: no pain  Mobility walk without assistance how many minutes can you walk? 5 ability to climb steps?  yes do you drive?  yes  Function retired  Neuro/Psych No problems in this area  Prior Studies Any changes since last visit?  no  Physicians involved in your care Any changes since last visit?  no   Family History    Problem Relation Age of Onset  . Stroke Mother   . Stroke Father   . Pneumonia Father    History   Social History  . Marital Status: Widowed    Spouse Name: N/A    Number of Children: N/A  . Years of Education: N/A   Social History Main Topics  . Smoking status: Never Smoker   . Smokeless tobacco: Never Used  . Alcohol Use: No  . Drug Use: No  . Sexually Active: No   Other Topics Concern  . None   Social History Narrative  . None   Past Surgical History  Procedure Date  . Pacemaker insertion 2001    Last generator in 2006; interrogated Dec 2012  . Breast lumpectomy     bilaterally; negative for cancer  . Tonsillectomy 1950  . Adenoidectomy   . Esophagogastroduodenoscopy 05/19/2011    Procedure: ESOPHAGOGASTRODUODENOSCOPY (EGD);  Surgeon: Shirley Friar, MD;  Location: [facility] ENDOSCOPY;  Service: Endoscopy;  Laterality: N/A;  doctor aware of inr   will try to be here no later than 230  . Cholecystectomy 2005  . Esophagogastroduodenoscopy 06/22/2011    Procedure: ESOPHAGOGASTRODUODENOSCOPY (EGD);  Surgeon: Willis Modena, MD;  Location: [facility] ENDOSCOPY;  Service: Endoscopy;  Laterality: N/A;  Check PT/INR in am  . Givens capsule study 06/23/2011    Procedure: GIVENS CAPSULE STUDY;  Surgeon: Willis Modena, MD;  Location: [facility] ENDOSCOPY;  Service: Endoscopy;  Laterality: N/A;  . Breast lumpectomy     left breast  Past Medical History  Diagnosis Date  . Pericardial effusion     Chronic. Last echo in April of 2012.   . Atrial fibrillation     Chronic  . Chronic anticoagulation     on Coumadin  . History of GI bleed     once treated with Fe infusion  . HTN (hypertension)   . Sleep apnea   . Pacemaker     placed for tachybrady.   . Valvular heart disease   . Obesity   . LVH (left ventricular hypertrophy)   . Stroke April 2013    right frontal  . Thrombophlebitis 1967    associated with pregnancy  . Diverticulosis   . Heart murmur    BP 125/54  Pulse 72  Resp  16  Ht 5\' 5"  (1.651 m)  Wt 163 lb 6.4 oz (74.118 kg)  BMI 27.19 kg/m2  SpO2 95%    Review of Systems  All other systems reviewed and are negative.       Objective:   Physical Exam  Constitutional: She is oriented to person, place, and time.  Musculoskeletal:       Right hip: She exhibits decreased range of motion and tenderness. She exhibits normal strength.       Tenderness over ischial bursa  Neurological: She is alert and oriented to person, place, and time. She has normal strength. No sensory deficit. She exhibits normal muscle tone. Gait abnormal.       Forward flexed  Psychiatric: She has a normal mood and affect. Her speech is normal and behavior is normal. Judgment and thought content normal. Cognition and memory are normal.          Assessment & Plan:  1. Small right frontal lobe infarct no significant residual deficits although her right buttock pain may be secondary to some compensation from mild weakness on the left side. 2. Right ischial her side this will instruct hamstring stretching exercises. Followup when necessary

## 2011-07-13 ENCOUNTER — Ambulatory Visit: Payer: Medicare Other | Admitting: Physical Therapy

## 2011-07-16 ENCOUNTER — Ambulatory Visit: Payer: Medicare Other | Admitting: Physical Therapy

## 2011-07-20 ENCOUNTER — Ambulatory Visit: Payer: Medicare Other | Admitting: Physical Therapy

## 2011-07-21 ENCOUNTER — Encounter: Payer: Self-pay | Admitting: Internal Medicine

## 2011-07-21 ENCOUNTER — Telehealth: Payer: Self-pay | Admitting: Internal Medicine

## 2011-07-21 NOTE — Telephone Encounter (Signed)
07-21-11 called pt to set up past due pacer check, n/a, unable to leave a message, sent past due letter/mt

## 2011-07-22 ENCOUNTER — Ambulatory Visit: Payer: Medicare Other | Admitting: Physical Therapy

## 2011-07-28 ENCOUNTER — Ambulatory Visit: Payer: Medicare Other

## 2011-07-28 ENCOUNTER — Ambulatory Visit (INDEPENDENT_AMBULATORY_CARE_PROVIDER_SITE_OTHER): Payer: Medicare Other | Admitting: Family Medicine

## 2011-07-28 VITALS — BP 118/54 | HR 93 | Temp 98.7°F | Resp 16 | Ht 65.0 in | Wt 158.4 lb

## 2011-07-28 DIAGNOSIS — K921 Melena: Secondary | ICD-10-CM

## 2011-07-28 DIAGNOSIS — M549 Dorsalgia, unspecified: Secondary | ICD-10-CM

## 2011-07-28 DIAGNOSIS — R0602 Shortness of breath: Secondary | ICD-10-CM

## 2011-07-28 LAB — POCT CBC
Granulocyte percent: 72 % (ref 37–80)
HCT, POC: 36 % — AB (ref 37.7–47.9)
Hemoglobin: 10.4 g/dL — AB (ref 12.2–16.2)
Lymph, poc: 1.8 (ref 0.6–3.4)
MCH, POC: 25.5 pg — AB (ref 27–31.2)
MCHC: 28.9 g/dL — AB (ref 31.8–35.4)
MCV: 88.3 fL (ref 80–97)
MID (cbc): 0.9 (ref 0–0.9)
MPV: 10 fL (ref 0–99.8)
POC Granulocyte: 6.8 (ref 2–6.9)
POC LYMPH PERCENT: 18.8 %L (ref 10–50)
POC MID %: 9.2 %M (ref 0–12)
Platelet Count, POC: 322 10*3/uL (ref 142–424)
RBC: 4.08 M/uL (ref 4.04–5.48)
RDW, POC: 16.4 %
WBC: 9.5 10*3/uL (ref 4.6–10.2)

## 2011-07-28 NOTE — Progress Notes (Signed)
[facility]:  Office Visit  Chief Complaint:  Chief Complaint  Patient presents with  . Rectal Bleeding    x 2 weeks blood in stool  . Back Pain    x ? chronic  . Fatigue    HPI: [name] is a 78 y.o. female who complains of the following chronic issues: 1. GIB- maroon color with outer ring of bright red blood, worsening problem for over 2 weeks with most bowel movements, this has been an ongoing problem for several months.  [name] has a complicated medical  history which includes: chronic anticoagulation with warfarin for A. Fib ( today's INR was 3.2), recent nonhemorrhagic  CVA in 05/16/2011, history of diverticular disease/AV malformation and also H. Pylori, history of pacemaker. She was recently hospitalized for GIB, anemia, and supratherapeutic INR at [facility] on 06/21/11. Her HgB was 9.4- 9.9, and was transfused 2 units of PRBC and Hgb went up to 11.2. She was seen in the hospital by GI, upper endoscopy was completed and did not show any abnormalities contributing to GIB. She is on PPI. She is seen by Dr. Sherilyn Dacosta from [facility]  2. Back pain which is also chronic,intermittent, nonradiating located on left upper thoracic region at T 8-10 close to midline, has a  h/o CT with pleural effusion, pericardial effusion. PAtient states that she takes codeine for this prn and that seems to relieve the pain. Has tried Aleve without any help.  3. Fatigue -chronic  Past Medical History  Diagnosis Date  . Pericardial effusion     Chronic. Last echo in April of 2012.   . Atrial fibrillation     Chronic  . Chronic anticoagulation     on Coumadin  . History of GI bleed     once treated with Fe infusion  . HTN (hypertension)   . Sleep apnea   . Pacemaker     placed for tachybrady.   . Valvular heart disease   . Obesity   . LVH (left ventricular hypertrophy)   . Stroke April 2013    right frontal  . Thrombophlebitis 1967    associated with pregnancy  . Diverticulosis    . Heart murmur    Past Surgical History  Procedure Date  . Pacemaker insertion 2001    Last generator in 2006; interrogated Dec 2012  . Breast lumpectomy     bilaterally; negative for cancer  . Tonsillectomy 1950  . Adenoidectomy   . Esophagogastroduodenoscopy 05/19/2011    Procedure: ESOPHAGOGASTRODUODENOSCOPY (EGD);  Surgeon: Shirley Friar, MD;  Location: [facility] ENDOSCOPY;  Service: Endoscopy;  Laterality: N/A;  doctor aware of inr   will try to be here no later than 230  . Cholecystectomy 2005  . Esophagogastroduodenoscopy 06/22/2011    Procedure: ESOPHAGOGASTRODUODENOSCOPY (EGD);  Surgeon: Willis Modena, MD;  Location: [facility] ENDOSCOPY;  Service: Endoscopy;  Laterality: N/A;  Check PT/INR in am  . Givens capsule study 06/23/2011    Procedure: GIVENS CAPSULE STUDY;  Surgeon: Willis Modena, MD;  Location: [facility] ENDOSCOPY;  Service: Endoscopy;  Laterality: N/A;  . Breast lumpectomy     left breast   History   Social History  . Marital Status: Widowed    Spouse Name: N/A    Number of Children: N/A  . Years of Education: N/A   Social History Main Topics  . Smoking status: Never Smoker   . Smokeless tobacco: Never Used  . Alcohol Use: No  . Drug Use: No  .  Sexually Active: No   Other Topics Concern  . None   Social History Narrative  . None   Family History  Problem Relation Age of Onset  . Stroke Mother   . Stroke Father   . Pneumonia Father    Allergies  Allergen Reactions  . Iodinated Diagnostic Agents Hives    HIVES S/P IV CONTRAST INJECTION,WILL NEED 13 HR PREP FOR FUTURE INJECTIONS, ok s/p 50mg  po benadryl//a.calhoun  . Anti-Inflammatory Enzyme (Nutritional Supplements)     Retains fluids and headaches  . Arthrotec (Diclofenac-Misoprostol) Other (See Comments)    unknown  . Biaxin (Clarithromycin) Other (See Comments)    unknown  . Plavix (Clopidogrel Bisulfate) Other (See Comments)    unknown   Prior to Admission medications   Medication Sig Start  Date End Date Taking? Authorizing Provider  digoxin (LANOXIN) 0.125 MG tablet Take 125 mcg by mouth daily.   Yes Historical Provider, MD  multivitamin [facility]) per tablet Take 1 tablet by mouth daily.     Yes Historical Provider, MD  pantoprazole (PROTONIX) 40 MG tablet Take 40 mg by mouth daily.   Yes Historical Provider, MD  spironolactone (ALDACTONE) 25 MG tablet Take 1 tablet (25 mg total) by mouth 2 (two) times daily. 07/05/11 07/04/12 Yes Wilburt Finlay, PA  torsemide (DEMADEX) 20 MG tablet Take 20 mg by mouth daily.   Yes Historical Provider, MD  verapamil (CALAN-SR) 180 MG CR tablet Take 1 tablet (180 mg total) by mouth daily. 07/05/11 07/04/12 Yes Wilburt Finlay, PA  warfarin (COUMADIN) 5 MG tablet Take 5 mg by mouth daily. Was taking 5 mg qd for six days a week and 2.5 mg 1 day. Coumadin was held during hospital stay. Starts back today.   Yes Historical Provider, MD     ROS: The patient denies fevers, chills, night sweats, unintentional weight loss, chest pain, palpitations, wheezing,  nausea, vomiting, abdominal pain, dysuria, hematuria,numbness, weakness, or tingling. + melena , fatigue, SOB  All other systems have been reviewed and were otherwise negative with the exception of those mentioned in the HPI and as above.    PHYSICAL EXAM: Filed Vitals:   07/28/11 1703  BP: 118/54  Pulse: 93  Temp: 98.7 F (37.1 C)  Resp: 16   Filed Vitals:   07/28/11 1703  Height: 5\' 5"  (1.651 m)  Weight: 158 lb 6.4 oz (71.85 kg)   Body mass index is 26.36 kg/(m^2).  General: Alert, no acute distress. Tired appearing HEENT:  Normocephalic, atraumatic, oropharynx patent. EOMI Cardiovascular:  Regular rate and rhythm, no rubs or gallops.  No Carotid bruits,No pedal edema.  Respiratory: Clear to auscultation bilaterally.  No wheezes, rales, or rhonchi.  No cyanosis, no use of accessory musculature GI: No organomegaly, abdomen is soft and non-tender, positive bowel sounds.  No masses. Skin: No  rashes. Neurologic: Facial musculature symmetric. Psychiatric: Patient is appropriate throughout our interaction. Musculoskeletal: Gait intact.   LABS: Results for orders placed in visit on 07/28/11  POCT CBC      Component Value Range   WBC 9.5  4.6 - 10.2 K/uL   Lymph, poc 1.8  0.6 - 3.4   POC LYMPH PERCENT 18.8  10 - 50 %L   MID (cbc) 0.9  0 - 0.9   POC MID % 9.2  0 - 12 %M   POC Granulocyte 6.8  2 - 6.9   Granulocyte percent 72.0  37 - 80 %G   RBC 4.08  4.04 - 5.48 M/uL  Hemoglobin 10.4 (*) 12.2 - 16.2 g/dL   HCT, POC 16.1 (*) 09.6 - 47.9 %   MCV 88.3  80 - 97 fL   MCH, POC 25.5 (*) 27 - 31.2 pg   MCHC 28.9 (*) 31.8 - 35.4 g/dL   RDW, POC 04.5     Platelet Count, POC 322  142 - 424 K/uL   MPV 10.0  0 - 99.8 fL     EKG/XRAY:   Primary read interpreted by Dr. Conley Rolls at [facility]. CXR unchanged, cardiomegaly,Pace maker. No effusion   ASSESSMENT/PLAN: Encounter Diagnoses  Name Primary?  . Melena Yes  . SOB (shortness of breath)   . Back pain    1. Codeine 15 mg #30 no refills for backpain. Hand written rx given.  2. CXR unchanged from prior, oximetry WNL. Continue to monitor for s/sx of CHF, worsening SOB/DOE/CP, weight gain 3. Patient asked to return to be seen by Dr. Sharion Balloon (GI doctor [facility]) for further evaluation of GIB/melena. Defer rectal exam since h/o of + IFOBT and GIB. We will try to expedite GI appt for her.  Hold warfarin as directed by coumadin clinic and cont with PPI. Today's Hgb is holding steady at 10.4    Saachi Zale PHUONG, DO 07/28/2011 6:10 PM

## 2011-08-03 ENCOUNTER — Other Ambulatory Visit: Payer: Self-pay | Admitting: *Deleted

## 2011-08-03 MED ORDER — TORSEMIDE 20 MG PO TABS: 20.0000 mg | ORAL_TABLET | Freq: Every day | ORAL | Status: DC

## 2011-08-07 ENCOUNTER — Other Ambulatory Visit: Payer: Self-pay | Admitting: Cardiovascular Disease

## 2011-08-11 ENCOUNTER — Encounter (HOSPITAL_COMMUNITY): Payer: Self-pay

## 2011-08-11 ENCOUNTER — Encounter (HOSPITAL_COMMUNITY)
Admission: RE | Disposition: A | Discharge status: Home / Self Care | Payer: Self-pay | Source: Ambulatory Visit | Attending: Gastroenterology

## 2011-08-11 ENCOUNTER — Encounter (HOSPITAL_COMMUNITY): Payer: Self-pay | Admitting: Anesthesiology

## 2011-08-11 ENCOUNTER — Encounter (HOSPITAL_COMMUNITY): Payer: Self-pay | Admitting: Gastroenterology

## 2011-08-11 ENCOUNTER — Ambulatory Visit (HOSPITAL_COMMUNITY)
Admission: RE | Admit: 2011-08-11 | Discharge: 2011-08-11 | Disposition: A | Discharge status: Home / Self Care | Payer: Medicare Other | Source: Ambulatory Visit | Attending: Gastroenterology | Admitting: Gastroenterology

## 2011-08-11 ENCOUNTER — Ambulatory Visit (HOSPITAL_COMMUNITY): Payer: Medicare Other | Admitting: Anesthesiology

## 2011-08-11 DIAGNOSIS — K922 Gastrointestinal hemorrhage, unspecified: Secondary | ICD-10-CM | POA: Insufficient documentation

## 2011-08-11 DIAGNOSIS — Z950 Presence of cardiac pacemaker: Secondary | ICD-10-CM | POA: Insufficient documentation

## 2011-08-11 DIAGNOSIS — I4891 Unspecified atrial fibrillation: Secondary | ICD-10-CM | POA: Insufficient documentation

## 2011-08-11 DIAGNOSIS — I10 Essential (primary) hypertension: Secondary | ICD-10-CM | POA: Insufficient documentation

## 2011-08-11 DIAGNOSIS — Z8673 Personal history of transient ischemic attack (TIA), and cerebral infarction without residual deficits: Secondary | ICD-10-CM | POA: Insufficient documentation

## 2011-08-11 DIAGNOSIS — K5520 Angiodysplasia of colon without hemorrhage: Secondary | ICD-10-CM | POA: Insufficient documentation

## 2011-08-11 DIAGNOSIS — K644 Residual hemorrhoidal skin tags: Secondary | ICD-10-CM | POA: Insufficient documentation

## 2011-08-11 DIAGNOSIS — K5730 Diverticulosis of large intestine without perforation or abscess without bleeding: Secondary | ICD-10-CM | POA: Insufficient documentation

## 2011-08-11 HISTORY — PX: COLONOSCOPY: SHX5424

## 2011-08-11 HISTORY — PX: HOT HEMOSTASIS: SHX5433

## 2011-08-11 LAB — CBC
MCH: 25.9 pg — ABNORMAL LOW (ref 26.0–34.0)
MCHC: 32.5 g/dL (ref 30.0–36.0)
MCV: 79.7 fL (ref 78.0–100.0)
Platelets: 482 10*3/uL — ABNORMAL HIGH (ref 150–400)
RDW: 14.6 % (ref 11.5–15.5)

## 2011-08-11 LAB — PROTIME-INR: Prothrombin Time: 23.5 seconds — ABNORMAL HIGH (ref 11.6–15.2)

## 2011-08-11 SURGERY — COLONOSCOPY
Anesthesia: Monitor Anesthesia Care

## 2011-08-11 MED ORDER — PROPOFOL 10 MG/ML IV EMUL
INTRAVENOUS | Status: DC | PRN
  Administered 2011-08-11: 50 ug/kg/min via INTRAVENOUS

## 2011-08-11 MED ORDER — LACTATED RINGERS IV SOLN
INTRAVENOUS | Status: DC | PRN
  Administered 2011-08-11: 14:00:00 via INTRAVENOUS

## 2011-08-11 MED ORDER — LACTATED RINGERS IV SOLN
INTRAVENOUS | Status: DC
  Administered 2011-08-11: 1000 mL via INTRAVENOUS

## 2011-08-11 NOTE — Anesthesia Preprocedure Evaluation (Signed)
Anesthesia Evaluation  Patient identified by MRN, date of birth, ID band Patient awake    Reviewed: Allergy & Precautions, H&P , NPO status , Patient's Chart, lab work & pertinent test results  Airway Mallampati: II TM Distance: >3 FB Neck ROM: Limited    Dental No notable dental hx.    Pulmonary neg pulmonary ROS,  breath sounds clear to auscultation  Pulmonary exam normal       Cardiovascular hypertension, Pt. on medications + dysrhythmias Atrial Fibrillation + pacemaker Rhythm:Regular Rate:Normal     Neuro/Psych CVA negative psych ROS   GI/Hepatic negative GI ROS, Neg liver ROS,   Endo/Other  negative endocrine ROS  Renal/GU negative Renal ROS  negative genitourinary   Musculoskeletal negative musculoskeletal ROS (+)   Abdominal   Peds negative pediatric ROS (+)  Hematology negative hematology ROS (+)   Anesthesia Other Findings   Reproductive/Obstetrics negative OB ROS                           Anesthesia Physical Anesthesia Plan  ASA: III  Anesthesia Plan: MAC   Post-op Pain Management:    Induction: Intravenous  Airway Management Planned: Nasal Cannula  Additional Equipment:   Intra-op Plan:   Post-operative Plan:   Informed Consent: I have reviewed the patients History and Physical, chart, labs and discussed the procedure including the risks, benefits and alternatives for the proposed anesthesia with the patient or authorized representative who has indicated his/her understanding and acceptance.   Dental advisory given  Plan Discussed with: CRNA  Anesthesia Plan Comments:         Anesthesia Quick Evaluation

## 2011-08-11 NOTE — Transfer of Care (Signed)
Immediate Anesthesia Transfer of Care Note  Patient: [name]  Procedure(s) Performed: Procedure(s) (LRB): COLONOSCOPY (N/A) HOT HEMOSTASIS (ARGON PLASMA COAGULATION/BICAP) (N/A)  Patient Location: PACU  Anesthesia Type: MAC  Level of Consciousness: awake, alert  and oriented  Airway & Oxygen Therapy: Patient Spontanous Breathing and Patient connected to face mask oxygen  Post-op Assessment: Report given to PACU RN and Post -op Vital signs reviewed and stable  Post vital signs: Reviewed and stable  Complications: No apparent anesthesia complications

## 2011-08-11 NOTE — Op Note (Signed)
[facility] [address] [place], [place]  [address]  COLONOSCOPY PROCEDURE REPORT  PATIENT:  [name], [name]  MR#:  [id number] BIRTHDATE:  [birth year], 88 yrs. old  GENDER:  female ENDOSCOPIST:  Vida Rigger, MD REF. BY: PROCEDURE DATE:  08/11/2011 PROCEDURE:  Colonoscopy for control of bleeding, Colonoscopy with ablation ASA CLASS:  Class II INDICATIONS:  GI bleeding with history of cecal AVMs MEDICATIONS:  150 mg propofol only DESCRIPTION OF PROCEDURE:   After the risks benefits and alternatives of the procedure were thoroughly explained, informed consent was obtained.  The Pentax Ped Colon [id number] endoscope was introduced through the anus and advanced to the cecum, which was identified by both the appendix and ileocecal valve, without limitations.  The quality of the prep was fair.Marland Kitchen abdominal pressure was needed to advance to the cecum and other than some small hemorrhoids and some left sided and rare transverse diverticuli no other abnormalities were seen on insertion and no signs of bleeding however the prep was only fair and lots of washing and suctioning was done throughout the procedure. 2 cecal AVMs were found only and were treated in the customary fashion with the APC with nice cautery and no obvious signs of bleeding or complication The instrument was then slowly withdrawn as the colon was fully examined. No additional findings were seen once back in the rectum anal rectal pull-through and retroflexion was done and then the scope was advanced a short ways up the left side of the colon air was suctioned scope removed patient tolerated the procedure well there was no obvious immediate complication <<PROCEDUREIMAGES>>  FINDINGS: 1. Small external greater than internal 2. Left-sided and rare distal transverse diverticuli 3. 2 cecal AVMs status post APC 4. Otherwise within normal limits to the cecum without signs of bleeding  COMPLICATIONS:  None  IMPRESSION: Above  RECOMMENDATIONS: See how she does clinically observe for delayed complication resume Coumadin call me when necessary and followup when necessary and consider repeat capsule endoscopy when necessary repeat bleeding  ______________________________ Vida Rigger, MD  CC:  n. eSIGNEDVida Rigger at 08/11/2011 03:28 PM  [name], [id number]

## 2011-08-11 NOTE — Progress Notes (Signed)
[name] 2:33 PM  Subjective: The patient has not had any further bleeding and no problems since we last saw her and no change in history  Objective: Vital signs stable afebrile exam please see pre- procedure physical INR slightly increased hemoglobin increased  Assessment: GI bleeding history of cecal AVM  Plan: Okay to proceed with colonoscopy and probable APC of AVM  Abigail Marsiglia E

## 2011-08-12 ENCOUNTER — Encounter (HOSPITAL_COMMUNITY): Payer: Self-pay | Admitting: Gastroenterology

## 2011-08-12 NOTE — Anesthesia Postprocedure Evaluation (Signed)
  Anesthesia Post-op Note  Patient: [name]  Procedure(s) Performed: Procedure(s) (LRB): COLONOSCOPY (N/A) HOT HEMOSTASIS (ARGON PLASMA COAGULATION/BICAP) (N/A)  Patient Location: PACU  Anesthesia Type: MAC  Level of Consciousness: awake and alert   Airway and Oxygen Therapy: Patient Spontanous Breathing  Post-op Pain: mild  Post-op Assessment: Post-op Vital signs reviewed, Patient's Cardiovascular Status Stable, Respiratory Function Stable, Patent Airway and No signs of Nausea or vomiting  Post-op Vital Signs: stable  Complications: No apparent anesthesia complications

## 2011-08-21 ENCOUNTER — Ambulatory Visit (HOSPITAL_BASED_OUTPATIENT_CLINIC_OR_DEPARTMENT_OTHER): Payer: Medicare Other | Admitting: Physical Medicine & Rehabilitation

## 2011-08-21 ENCOUNTER — Encounter: Payer: Medicare Other | Attending: Physical Medicine & Rehabilitation

## 2011-08-21 ENCOUNTER — Encounter: Payer: Self-pay | Admitting: Physical Medicine & Rehabilitation

## 2011-08-21 VITALS — BP 118/68 | HR 121 | Resp 16 | Wt 149.4 lb

## 2011-08-21 DIAGNOSIS — G8929 Other chronic pain: Secondary | ICD-10-CM

## 2011-08-21 DIAGNOSIS — R29898 Other symptoms and signs involving the musculoskeletal system: Secondary | ICD-10-CM | POA: Insufficient documentation

## 2011-08-21 DIAGNOSIS — I69998 Other sequelae following unspecified cerebrovascular disease: Secondary | ICD-10-CM | POA: Insufficient documentation

## 2011-08-21 DIAGNOSIS — R279 Unspecified lack of coordination: Secondary | ICD-10-CM | POA: Insufficient documentation

## 2011-08-21 DIAGNOSIS — M549 Dorsalgia, unspecified: Secondary | ICD-10-CM

## 2011-08-21 DIAGNOSIS — Z7901 Long term (current) use of anticoagulants: Secondary | ICD-10-CM | POA: Insufficient documentation

## 2011-08-21 DIAGNOSIS — Z950 Presence of cardiac pacemaker: Secondary | ICD-10-CM | POA: Insufficient documentation

## 2011-08-21 MED ORDER — TRAMADOL HCL 50 MG PO TABS: 50.0000 mg | ORAL_TABLET | Freq: Three times a day (TID) | ORAL | Status: AC | PRN

## 2011-08-21 NOTE — Patient Instructions (Signed)
tramadol for you. You can pick this up at your pharmacy. I'll see back in 3 months.

## 2011-08-21 NOTE — Progress Notes (Signed)
Subjective:    Patient ID: [name], female    DOB: 12-18-1923, 78 y.o.   MRN: [id number]  HPI This is an 78 year old right-handed female with history of atrial  fibrillation, pacemaker, chronic Coumadin therapy who was admitted on  May 16, 2011, with left-sided weakness. Cranial CT scan showed small  acute nonhemorrhagic mid to posterior right frontal lobe infarction.  MRI could not be completed due to pacemaker. Carotid Dopplers with no  ICA stenosis. Echocardiogram with ejection fraction of 70% without  emboli. Neurology consulted and remained on Coumadin therapy with INR  1.71.  Pain Inventory Average Pain 8 Pain Right Now 8 My pain is burning and aching  In the last 24 hours, has pain interfered with the following? General activity 7 Relation with others 9 Enjoyment of life 9 What TIME of day is your pain at its worst? daytime Sleep (in general) Poor  Pain is worse with: walking, bending and sitting Pain improves with: rest and medication Relief from Meds: 8  Mobility walk without assistance ability to climb steps?  yes do you drive?  yes  Function retired  Neuro/Psych tremor spasms  Prior Studies Any changes since last visit?  no  Physicians involved in your care Any changes since last visit?  no   Family History  Problem Relation Age of Onset  . Stroke Mother   . Stroke Father   . Pneumonia Father    History   Social History  . Marital Status: Widowed    Spouse Name: N/A    Number of Children: N/A  . Years of Education: N/A   Social History Main Topics  . Smoking status: Never Smoker   . Smokeless tobacco: Never Used  . Alcohol Use: No  . Drug Use: No  . Sexually Active: No   Other Topics Concern  . None   Social History Narrative  . None   Past Surgical History  Procedure Date  . Pacemaker insertion 2001    Last generator in 2006; interrogated Dec 2012  . Breast lumpectomy     bilaterally; negative for cancer  .  Tonsillectomy 1950  . Adenoidectomy   . Esophagogastroduodenoscopy 05/19/2011    Procedure: ESOPHAGOGASTRODUODENOSCOPY (EGD);  Surgeon: Shirley Friar, MD;  Location[facility] ENDOSCOPY;  Service: Endoscopy;  Laterality: N/A;  doctor aware of inr   will try to be here no later than 230  . Cholecystectomy 2005  . Esophagogastroduodenoscopy 06/22/2011    Procedure: ESOPHAGOGASTRODUODENOSCOPY (EGD);  Surgeon: Willis Modena, MD;  Location: [facility] ENDOSCOPY;  Service: Endoscopy;  Laterality: N/A;  Check PT/INR in am  . Givens capsule study 06/23/2011    Procedure: GIVENS CAPSULE STUDY;  Surgeon: Willis Modena, MD;  Location: [facility] ENDOSCOPY;  Service: Endoscopy;  Laterality: N/A;  . Breast lumpectomy     left breast  . Colonoscopy 08/11/2011    Procedure: COLONOSCOPY;  Surgeon: Petra Kuba, MD;  Location: [facility] ENDOSCOPY;  Service: Endoscopy;  Laterality: N/A;  . Hot hemostasis 08/11/2011    Procedure: HOT HEMOSTASIS (ARGON PLASMA COAGULATION/BICAP);  Surgeon: Petra Kuba, MD;  Location: [facility] ENDOSCOPY;  Service: Endoscopy;  Laterality: N/A;   Past Medical History  Diagnosis Date  . Pericardial effusion     Chronic. Last echo in April of 2012.   . Atrial fibrillation     Chronic  . Chronic anticoagulation     on Coumadin  . History of GI bleed     once treated with Fe infusion  . HTN (  hypertension)   . Sleep apnea   . Pacemaker     placed for tachybrady.   . Valvular heart disease   . Obesity   . LVH (left ventricular hypertrophy)   . Stroke April 2013    right frontal  . Thrombophlebitis 1967    associated with pregnancy  . Diverticulosis   . Heart murmur    BP 118/68  Pulse 121  Resp 16  Wt 149 lb 6.4 oz (67.767 kg)  SpO2 97%   Review of Systems  Musculoskeletal:       Spasms  Neurological: Positive for tremors.  All other systems reviewed and are negative.       Objective:   Physical Exam  Musculoskeletal:       Thoracic back: She exhibits tenderness and pain. She exhibits  normal range of motion, no deformity and no spasm.       Lumbar back: Normal.       - SLR  Neurological: She has normal strength. Gait abnormal.       Balance reduced with turning           Assessment & Plan:  1.back pain she's had chest CTs showing no malignancy. Her examination is unremarkable may have some myofascial-type discomfort. She does not want any physical therapy. She's tried her husband's fentanyl patches before he died he said this worked Firefighter. I told her that with her age and balance problems I would not recommend this. I will write her a prescription for tramadol. Trigger point injections may be helpful in this situation 2.Small right frontal lobe infarct no significant residual deficits except for some reduced balance

## 2011-08-24 ENCOUNTER — Telehealth: Payer: Self-pay | Admitting: Physical Medicine & Rehabilitation

## 2011-08-24 MED ORDER — LIDOCAINE 5 % EX PTCH: 1.0000 | MEDICATED_PATCH | CUTANEOUS | Status: DC

## 2011-08-24 NOTE — Telephone Encounter (Signed)
Please call about her medication

## 2011-08-24 NOTE — Telephone Encounter (Signed)
Tramadol isn't helping and it is now causing her to itch. Please advise.

## 2011-08-24 NOTE — Telephone Encounter (Signed)
Pt aware, lidoderm patch has been sent in for her.

## 2011-08-24 NOTE — Telephone Encounter (Signed)
We could try a Flector patch or Lidoderm patch, oral anti inflammatory might interact with her coumadin, will not give her stronger narcotic because of age and balance problems, did she have one of those patches in the past, and did she tolerate one

## 2011-08-25 NOTE — Telephone Encounter (Signed)
thanks

## 2011-08-27 ENCOUNTER — Telehealth: Payer: Self-pay | Admitting: Physical Medicine & Rehabilitation

## 2011-08-27 NOTE — Telephone Encounter (Signed)
Pts questions have been answered.

## 2011-08-27 NOTE — Telephone Encounter (Signed)
Has question about medication °

## 2011-09-18 ENCOUNTER — Other Ambulatory Visit: Payer: Self-pay | Admitting: *Deleted

## 2011-09-18 NOTE — Telephone Encounter (Signed)
Opened in Error.

## 2011-10-12 ENCOUNTER — Telehealth: Payer: Self-pay | Admitting: Physical Medicine & Rehabilitation

## 2011-10-12 ENCOUNTER — Other Ambulatory Visit: Payer: Self-pay | Admitting: Physical Medicine and Rehabilitation

## 2011-10-12 NOTE — Telephone Encounter (Signed)
This has been done.

## 2011-10-12 NOTE — Telephone Encounter (Signed)
Refill on patches

## 2011-10-16 ENCOUNTER — Encounter: Payer: Medicare Other | Attending: Physical Medicine & Rehabilitation

## 2011-10-16 ENCOUNTER — Ambulatory Visit (HOSPITAL_BASED_OUTPATIENT_CLINIC_OR_DEPARTMENT_OTHER): Payer: Medicare Other | Admitting: Physical Medicine & Rehabilitation

## 2011-10-16 ENCOUNTER — Encounter: Payer: Self-pay | Admitting: Physical Medicine & Rehabilitation

## 2011-10-16 VITALS — BP 124/79 | HR 75 | Resp 16 | Ht 65.0 in | Wt 141.0 lb

## 2011-10-16 DIAGNOSIS — E669 Obesity, unspecified: Secondary | ICD-10-CM | POA: Insufficient documentation

## 2011-10-16 DIAGNOSIS — M7918 Myalgia, other site: Secondary | ICD-10-CM | POA: Insufficient documentation

## 2011-10-16 DIAGNOSIS — G4730 Sleep apnea, unspecified: Secondary | ICD-10-CM | POA: Insufficient documentation

## 2011-10-16 DIAGNOSIS — IMO0001 Reserved for inherently not codable concepts without codable children: Secondary | ICD-10-CM

## 2011-10-16 DIAGNOSIS — I10 Essential (primary) hypertension: Secondary | ICD-10-CM | POA: Insufficient documentation

## 2011-10-16 DIAGNOSIS — M25519 Pain in unspecified shoulder: Secondary | ICD-10-CM | POA: Insufficient documentation

## 2011-10-16 DIAGNOSIS — I4891 Unspecified atrial fibrillation: Secondary | ICD-10-CM | POA: Insufficient documentation

## 2011-10-16 DIAGNOSIS — Z950 Presence of cardiac pacemaker: Secondary | ICD-10-CM | POA: Insufficient documentation

## 2011-10-16 DIAGNOSIS — M549 Dorsalgia, unspecified: Secondary | ICD-10-CM | POA: Insufficient documentation

## 2011-10-16 DIAGNOSIS — Z8673 Personal history of transient ischemic attack (TIA), and cerebral infarction without residual deficits: Secondary | ICD-10-CM | POA: Insufficient documentation

## 2011-10-16 DIAGNOSIS — Z7901 Long term (current) use of anticoagulants: Secondary | ICD-10-CM | POA: Insufficient documentation

## 2011-10-16 NOTE — Progress Notes (Signed)
Subjective:    Patient ID: [name], female    DOB: 1923-06-30, 78 y.o.   MRN: [id number]  HPI No falls no new trauma no new complaints continues with previous complaints of left upper back pain Pain around left shoulder blade Reviewed CT of the chest, Performed on 07/04/2011. Pain Inventory Average Pain 9 Pain Right Now 3 My pain is constant  In the last 24 hours, has pain interfered with the following? General activity 7 Relation with others 6 Enjoyment of life 9 What TIME of day is your pain at its worst? All Day Sleep (in general) Poor  Pain is worse with: walking and some activites Pain improves with: rest and heat/ice Relief from Meds: 1  Mobility walk without assistance  Function retired  Neuro/Psych tremor  Prior Studies Any changes since last visit?  no  Physicians involved in your care Any changes since last visit?  no   Family History  Problem Relation Age of Onset  . Stroke Mother   . Stroke Father   . Pneumonia Father    History   Social History  . Marital Status: Widowed    Spouse Name: N/A    Number of Children: N/A  . Years of Education: N/A   Social History Main Topics  . Smoking status: Never Smoker   . Smokeless tobacco: Never Used  . Alcohol Use: No  . Drug Use: No  . Sexually Active: No   Other Topics Concern  . None   Social History Narrative  . None   Past Surgical History  Procedure Date  . Pacemaker insertion 2001    Last generator in 2006; interrogated Dec 2012  . Breast lumpectomy     bilaterally; negative for cancer  . Tonsillectomy 1950  . Adenoidectomy   . Esophagogastroduodenoscopy 05/19/2011    Procedure: ESOPHAGOGASTRODUODENOSCOPY (EGD);  Surgeon: Shirley Friar, MD;  Location: [facility] ENDOSCOPY;  Service: Endoscopy;  Laterality: N/A;  doctor aware of inr   will try to be here no later than 230  . Cholecystectomy 2005  . Esophagogastroduodenoscopy 06/22/2011    Procedure: ESOPHAGOGASTRODUODENOSCOPY  (EGD);  Surgeon: Willis Modena, MD;  Location: [facility] ENDOSCOPY;  Service: Endoscopy;  Laterality: N/A;  Check PT/INR in am  . Givens capsule study 06/23/2011    Procedure: GIVENS CAPSULE STUDY;  Surgeon: Willis Modena, MD;  Location: [facility] ENDOSCOPY;  Service: Endoscopy;  Laterality: N/A;  . Breast lumpectomy     left breast  . Colonoscopy 08/11/2011    Procedure: COLONOSCOPY;  Surgeon: Petra Kuba, MD;  Location: [facility] ENDOSCOPY;  Service: Endoscopy;  Laterality: N/A;  . Hot hemostasis 08/11/2011    Procedure: HOT HEMOSTASIS (ARGON PLASMA COAGULATION/BICAP);  Surgeon: Petra Kuba, MD;  Location: [facility] ENDOSCOPY;  Service: Endoscopy;  Laterality: N/A;   Past Medical History  Diagnosis Date  . Pericardial effusion     Chronic. Last echo in April of 2012.   . Atrial fibrillation     Chronic  . Chronic anticoagulation     on Coumadin  . History of GI bleed     once treated with Fe infusion  . HTN (hypertension)   . Sleep apnea   . Pacemaker     placed for tachybrady.   . Valvular heart disease   . Obesity   . LVH (left ventricular hypertrophy)   . Stroke April 2013    right frontal  . Thrombophlebitis 1967    associated with pregnancy  . Diverticulosis   . Heart  murmur    BP 124/79  Pulse 75  Resp 16  Ht 5\' 5"  (1.651 m)  Wt 141 lb (63.957 kg)  BMI 23.46 kg/m2  SpO2 92%      Review of Systems  Constitutional: Negative.   HENT: Negative.   Eyes: Negative.   Respiratory: Negative.   Cardiovascular: Negative.   Gastrointestinal: Negative.   Genitourinary: Negative.   Musculoskeletal: Positive for back pain.  Skin: Negative.   Neurological: Negative.   Hematological: Negative.   Psychiatric/Behavioral: Negative.        Objective:   Physical Exam  Constitutional: She appears well-developed and well-nourished.  HENT:  Head: Normocephalic and atraumatic.  Eyes: Conjunctivae normal and EOM are normal. Pupils are equal, round, and reactive to light.  Neck: Normal range of  motion.  Musculoskeletal:       Thoracic back: She exhibits tenderness.       Lumbar back: She exhibits decreased range of motion. She exhibits no tenderness.       Back:       Left T7 paraspinal tenderness  Neurological: She has normal strength. No sensory deficit. Gait abnormal.       Gait is mildly forward flexed  Psychiatric: She has a normal mood and affect.          Assessment & Plan:  1.  Myofascial pain left periscapular, this would account for her complaints as well as her examination. We reviewed the CT of the chest. I looked at the periscapular area as well as the thoracic spine and did not see any lesions to account for her pain. She has tenderness around the T7 paraspinals left side  Trigger point injection left T7 paraspinals-Informed consent was obtained after describing risks and benefits of the procedure 2 tender points were marked and prepped with Betadine and alcohol then entered with a 25-gauge 1-1/2 inch needle directed medially. 1 cc of 1% lidocaine were injected into each of 2 spots. Patient tolerated procedure well. Post procedure instructions given

## 2011-10-16 NOTE — Patient Instructions (Signed)
We did a trigger point injection today. We did the left periscapular muscles. This area may be sore for a day which should improve with time I do think he would benefit from physical therapy You may benefit from acupuncture but this is not a covered benefit under the Medicare plan We can repeat trigger point injections every 2 months if need be If this injection is helpful please call me to repeat it when it wears off

## 2011-10-17 ENCOUNTER — Ambulatory Visit: Payer: Medicare Other

## 2011-10-17 ENCOUNTER — Ambulatory Visit (INDEPENDENT_AMBULATORY_CARE_PROVIDER_SITE_OTHER): Payer: Medicare Other | Admitting: Emergency Medicine

## 2011-10-17 VITALS — BP 122/78 | HR 84 | Temp 98.2°F | Resp 18 | Ht 64.25 in | Wt 141.2 lb

## 2011-10-17 DIAGNOSIS — M542 Cervicalgia: Secondary | ICD-10-CM

## 2011-10-17 DIAGNOSIS — M5090 Cervical disc disorder, unspecified, unspecified cervical region: Secondary | ICD-10-CM

## 2011-10-17 DIAGNOSIS — Z23 Encounter for immunization: Secondary | ICD-10-CM

## 2011-10-17 DIAGNOSIS — M549 Dorsalgia, unspecified: Secondary | ICD-10-CM

## 2011-10-17 MED ORDER — CODEINE SULFATE 15 MG PO TABS: 15.0000 mg | ORAL_TABLET | Freq: Three times a day (TID) | ORAL | Status: DC | PRN

## 2011-10-17 NOTE — Progress Notes (Signed)
  Subjective:    Patient ID: [name], female    DOB: 11-14-1923, 78 y.o.   MRN: [id number]  HPI patient states she went to pain management yesterday and had an injection of her back. She continues to have severe pain on the left side of her neck and left perithoracic area . She has had no radicular symptoms down her arms. She has been under treatment for a large pericardial effusion and is doing well with this recently. She is here requesting a trauma of codeine quarter gr which she takes for severe back pain to    Review of Systems     Objective:   Physical Exam there is tenderness at the bases call on the left. There is tenderness along the left paracervical muscles. Her neck has decreased range of motion. There is no upper extremity weakness noted. There is tenderness along the left perithoracic area. Breath sounds are symmetrical. Her cardiac exam is irregular she does have a pacemaker in .  [facility] reading (PRIMARY) by  Dr.Clarke Amburn she has C5-C6 degenerative disc disease. Thoracic spine films show osteoporosis but no compression fractures seen       Assessment & Plan:   Treat with codeine quarter gr every 6-8 hours for pain

## 2011-11-17 ENCOUNTER — Ambulatory Visit: Payer: Medicare Other | Admitting: Physical Medicine & Rehabilitation

## 2011-11-25 ENCOUNTER — Ambulatory Visit (INDEPENDENT_AMBULATORY_CARE_PROVIDER_SITE_OTHER): Payer: Medicare Other | Admitting: Emergency Medicine

## 2011-11-25 VITALS — BP 132/78 | HR 102 | Temp 97.9°F | Resp 18 | Ht 65.0 in | Wt 150.2 lb

## 2011-11-25 DIAGNOSIS — I4891 Unspecified atrial fibrillation: Secondary | ICD-10-CM

## 2011-11-25 DIAGNOSIS — M5090 Cervical disc disorder, unspecified, unspecified cervical region: Secondary | ICD-10-CM

## 2011-11-25 DIAGNOSIS — K649 Unspecified hemorrhoids: Secondary | ICD-10-CM

## 2011-11-25 MED ORDER — CODEINE SULFATE 15 MG PO TABS: 15.0000 mg | ORAL_TABLET | Freq: Three times a day (TID) | ORAL | Status: DC | PRN

## 2011-11-25 MED ORDER — HYDROCORTISONE ACETATE 25 MG RE SUPP: 25.0000 mg | Freq: Two times a day (BID) | RECTAL | Status: DC

## 2011-11-25 NOTE — Progress Notes (Signed)
  Subjective:    Patient ID: [name], female    DOB: 1923/10/19, 78 y.o.   MRN: [id number]  HPI patient here with significant pain in her thoracic area. She states codeine for severe discomfort. She has pain in between her shoulder blades with pain with movement of the arm to she also is having some problems with perianal itching and discomfort. He used Anusol cream in the past the    Review of Systems     Objective:   Physical Exam patient is alert and cooperative. There is tenderness to palpation over the spinous process of T6-T8. There is good motion of her upper extremities. There is a regular rhythm with 2/6 systolic murmur at the left sternal border.        Assessment & Plan:  Have gone ahead and refilled her codeine. Have also sent in her perianal cream to use. Recheck on a when necessary basis

## 2011-12-15 ENCOUNTER — Ambulatory Visit: Payer: Medicare Other | Admitting: Physical Medicine & Rehabilitation

## 2012-03-02 ENCOUNTER — Other Ambulatory Visit: Payer: Self-pay

## 2012-03-22 ENCOUNTER — Other Ambulatory Visit: Payer: Self-pay | Admitting: Emergency Medicine

## 2012-03-22 DIAGNOSIS — Z1231 Encounter for screening mammogram for malignant neoplasm of breast: Secondary | ICD-10-CM

## 2012-03-28 ENCOUNTER — Other Ambulatory Visit (HOSPITAL_COMMUNITY): Payer: Self-pay | Admitting: Cardiovascular Disease

## 2012-04-05 ENCOUNTER — Ambulatory Visit (HOSPITAL_COMMUNITY)
Admission: RE | Admit: 2012-04-05 | Discharge: 2012-04-05 | Disposition: A | Discharge status: Home / Self Care | Payer: Medicare Other | Source: Ambulatory Visit | Attending: Emergency Medicine | Admitting: Emergency Medicine

## 2012-04-05 DIAGNOSIS — Z1231 Encounter for screening mammogram for malignant neoplasm of breast: Secondary | ICD-10-CM | POA: Insufficient documentation

## 2012-04-15 ENCOUNTER — Ambulatory Visit: Payer: Self-pay | Admitting: Cardiovascular Disease

## 2012-04-15 DIAGNOSIS — I4891 Unspecified atrial fibrillation: Secondary | ICD-10-CM

## 2012-04-15 DIAGNOSIS — I639 Cerebral infarction, unspecified: Secondary | ICD-10-CM

## 2012-04-15 DIAGNOSIS — Z7901 Long term (current) use of anticoagulants: Secondary | ICD-10-CM

## 2012-04-22 ENCOUNTER — Ambulatory Visit: Payer: Medicare Other

## 2012-06-11 ENCOUNTER — Ambulatory Visit: Payer: Medicare Other

## 2012-06-11 ENCOUNTER — Ambulatory Visit (INDEPENDENT_AMBULATORY_CARE_PROVIDER_SITE_OTHER): Payer: Medicare Other | Admitting: Emergency Medicine

## 2012-06-11 VITALS — BP 132/70 | HR 90 | Temp 97.4°F | Resp 16 | Ht 65.0 in | Wt 158.0 lb

## 2012-06-11 DIAGNOSIS — M5090 Cervical disc disorder, unspecified, unspecified cervical region: Secondary | ICD-10-CM

## 2012-06-11 DIAGNOSIS — R0781 Pleurodynia: Secondary | ICD-10-CM

## 2012-06-11 DIAGNOSIS — R079 Chest pain, unspecified: Secondary | ICD-10-CM

## 2012-06-11 DIAGNOSIS — K6289 Other specified diseases of anus and rectum: Secondary | ICD-10-CM

## 2012-06-11 DIAGNOSIS — I4891 Unspecified atrial fibrillation: Secondary | ICD-10-CM

## 2012-06-11 LAB — POCT CBC
Granulocyte percent: 76.9 %G (ref 37–80)
Lymph, poc: 1.1 (ref 0.6–3.4)
MCH, POC: 29.5 pg (ref 27–31.2)
MCHC: 31.7 g/dL — AB (ref 31.8–35.4)
MCV: 93.3 fL (ref 80–97)
MID (cbc): 0.4 (ref 0–0.9)
POC LYMPH PERCENT: 17 %L (ref 10–50)
Platelet Count, POC: 242 10*3/uL (ref 142–424)
RDW, POC: 14.2 %
WBC: 6.6 10*3/uL (ref 4.6–10.2)

## 2012-06-11 LAB — BASIC METABOLIC PANEL
BUN: 15 mg/dL (ref 6–23)
Calcium: 9.7 mg/dL (ref 8.4–10.5)
Creat: 1.02 mg/dL (ref 0.50–1.10)
Glucose, Bld: 113 mg/dL — ABNORMAL HIGH (ref 70–99)

## 2012-06-11 MED ORDER — CODEINE SULFATE 15 MG PO TABS: 15.0000 mg | ORAL_TABLET | Freq: Three times a day (TID) | ORAL | Status: DC | PRN

## 2012-06-11 MED ORDER — HYDROCORTISONE 2.5 % RE CREA: 1.0000 "application " | TOPICAL_CREAM | Freq: Two times a day (BID) | RECTAL | Status: DC

## 2012-06-11 NOTE — Progress Notes (Signed)
  Subjective:    Patient ID: [name], female    DOB: Mar 04, 1923, 79 y.o.   MRN: [id number]  HPI  79 year old female asking when she might get over the limp from her stroke.  Short of breath from time to time.  Larey Seat and injured her left side on Wednesday.  Patient is in atrial fib all the time.  She is taking coumadin. She injured herself on Thursday and was able to mow yesterday. She feels she may be injured the cartilage on the left side of her chest. She is also requesting a refill her codeine she takes a quarter gr as needed for pain. She's also requesting that she have her blood work done as ordered by her cardiologist without having to come in here for a check.     Review of Systems     Objective:   Physical Exam patient is alert and cooperative she is in no distress. Her neck is supple. Cardiac exam reveals a rapid rate which is irregular. There is tenderness along the left costal margin.  [facility] reading (PRIMARY) by  Dr. Cleta Alberts x-ray shows massive cardiomegaly versus pericardial effusion no rib fracture is seen   Results for orders placed in visit on 06/11/12  POCT CBC      Result Value Range   WBC 6.6  4.6 - 10.2 K/uL   Lymph, poc 1.1  0.6 - 3.4   POC LYMPH PERCENT 17.0  10 - 50 %L   MID (cbc) 0.4  0 - 0.9   POC MID % 6.1  0 - 12 %M   POC Granulocyte 5.1  2 - 6.9   Granulocyte percent 76.9  37 - 80 %G   RBC 4.47  4.04 - 5.48 M/uL   Hemoglobin 13.2  12.2 - 16.2 g/dL   HCT, POC 78.2  95.6 - 47.9 %   MCV 93.3  80 - 97 fL   MCH, POC 29.5  27 - 31.2 pg   MCHC 31.7 (*) 31.8 - 35.4 g/dL   RDW, POC 21.3     Platelet Count, POC 242  142 - 424 K/uL   MPV 9.2  0 - 99.8 fL       Assessment & Plan:  Labs were done and these need to be sent to Dr.Croitoru. I refilled her codeine to have for chest wall pain.

## 2012-06-14 ENCOUNTER — Telehealth: Payer: Self-pay

## 2012-06-14 NOTE — Telephone Encounter (Signed)
Yes, she should be very concerned about this. Called her to advise her to take in deep breaths multiple times daily try to move/ walk around as much as she can. Did not get answer, will try again later. Amy

## 2012-06-14 NOTE — Telephone Encounter (Signed)
Patient pulled her chest muscles a second time after seeing dr. Cleta Alberts.  She wants to know if she needs to worry about getting pneumonia.  She had the vaccine.  Please call [phone]

## 2012-06-15 ENCOUNTER — Other Ambulatory Visit: Payer: Self-pay | Admitting: Emergency Medicine

## 2012-06-15 ENCOUNTER — Encounter: Payer: Self-pay | Admitting: Physician Assistant

## 2012-06-15 LAB — POCT INR: INR: 2.5

## 2012-06-15 NOTE — Telephone Encounter (Signed)
error 

## 2012-06-17 ENCOUNTER — Other Ambulatory Visit: Payer: Self-pay | Admitting: Radiology

## 2012-06-17 ENCOUNTER — Ambulatory Visit (INDEPENDENT_AMBULATORY_CARE_PROVIDER_SITE_OTHER): Payer: Self-pay | Admitting: Pharmacist Clinician (PhC)/ Clinical Pharmacy Specialist

## 2012-06-17 DIAGNOSIS — I4891 Unspecified atrial fibrillation: Secondary | ICD-10-CM

## 2012-06-17 DIAGNOSIS — I639 Cerebral infarction, unspecified: Secondary | ICD-10-CM

## 2012-06-17 DIAGNOSIS — I6350 Cerebral infarction due to unspecified occlusion or stenosis of unspecified cerebral artery: Secondary | ICD-10-CM

## 2012-06-17 DIAGNOSIS — Z7901 Long term (current) use of anticoagulants: Secondary | ICD-10-CM

## 2012-06-17 MED ORDER — METHOCARBAMOL 500 MG PO TABS: ORAL_TABLET | ORAL | Status: DC

## 2012-06-17 NOTE — Telephone Encounter (Signed)
Called her. To advise Rx for Robaxin sent in per Dr Cleta Alberts. Did not get answer.

## 2012-06-17 NOTE — Telephone Encounter (Signed)
Please call again to check on status

## 2012-06-17 NOTE — Telephone Encounter (Signed)
Called her about Rx.

## 2012-06-17 NOTE — Telephone Encounter (Signed)
Called her, to see how she is doing. She states she has been mowing, she states she has been "bad" she c/o increased back pain after mowing but otherwise she is okay. She is asking for a muscle relaxer to help with the back pain , she takes the pain meds with codeine, but if she takes in the afternoon it keeps her awake.

## 2012-06-20 NOTE — Telephone Encounter (Signed)
Patient advised.

## 2012-06-27 ENCOUNTER — Other Ambulatory Visit (HOSPITAL_COMMUNITY): Payer: Self-pay | Admitting: Cardiovascular Disease

## 2012-06-29 ENCOUNTER — Ambulatory Visit (INDEPENDENT_AMBULATORY_CARE_PROVIDER_SITE_OTHER): Payer: Self-pay | Admitting: Pharmacist Clinician (PhC)/ Clinical Pharmacy Specialist

## 2012-06-29 DIAGNOSIS — Z7901 Long term (current) use of anticoagulants: Secondary | ICD-10-CM

## 2012-06-29 DIAGNOSIS — I639 Cerebral infarction, unspecified: Secondary | ICD-10-CM

## 2012-06-29 DIAGNOSIS — I4891 Unspecified atrial fibrillation: Secondary | ICD-10-CM

## 2012-06-29 DIAGNOSIS — I6350 Cerebral infarction due to unspecified occlusion or stenosis of unspecified cerebral artery: Secondary | ICD-10-CM

## 2012-07-12 ENCOUNTER — Ambulatory Visit (INDEPENDENT_AMBULATORY_CARE_PROVIDER_SITE_OTHER): Payer: Self-pay | Admitting: Pharmacist Clinician (PhC)/ Clinical Pharmacy Specialist

## 2012-07-12 DIAGNOSIS — I639 Cerebral infarction, unspecified: Secondary | ICD-10-CM

## 2012-07-12 DIAGNOSIS — I6350 Cerebral infarction due to unspecified occlusion or stenosis of unspecified cerebral artery: Secondary | ICD-10-CM

## 2012-07-12 DIAGNOSIS — Z7901 Long term (current) use of anticoagulants: Secondary | ICD-10-CM

## 2012-07-12 DIAGNOSIS — I4891 Unspecified atrial fibrillation: Secondary | ICD-10-CM

## 2012-07-20 ENCOUNTER — Ambulatory Visit (INDEPENDENT_AMBULATORY_CARE_PROVIDER_SITE_OTHER): Payer: Self-pay | Admitting: Pharmacist Clinician (PhC)/ Clinical Pharmacy Specialist

## 2012-07-20 DIAGNOSIS — Z7901 Long term (current) use of anticoagulants: Secondary | ICD-10-CM

## 2012-07-20 DIAGNOSIS — I639 Cerebral infarction, unspecified: Secondary | ICD-10-CM

## 2012-07-20 DIAGNOSIS — I6350 Cerebral infarction due to unspecified occlusion or stenosis of unspecified cerebral artery: Secondary | ICD-10-CM

## 2012-07-20 DIAGNOSIS — I4891 Unspecified atrial fibrillation: Secondary | ICD-10-CM

## 2012-07-20 LAB — POCT INR: INR: 1.9

## 2012-07-21 ENCOUNTER — Telehealth: Payer: Self-pay | Admitting: Internal Medicine

## 2012-07-21 NOTE — Telephone Encounter (Signed)
07-21-12 called pt re past due device checks, pt having checks done with dr croitoru/mt

## 2012-07-26 ENCOUNTER — Telehealth: Payer: Self-pay | Admitting: Cardiovascular Disease

## 2012-07-26 NOTE — Telephone Encounter (Signed)
Take an extra torsemide for three days.  Monitor weight for decrease.  No salt.   Winthrop Shannahan 1:50 PM

## 2012-07-26 NOTE — Telephone Encounter (Signed)
Gained five pounds since yesterday! Please call-don't think her fluid pill is working!

## 2012-07-26 NOTE — Telephone Encounter (Signed)
Returned call.  Pt stated she gained 5lbs from yesterday morning until this morning.  C/o BLE edema.  Stated she was real SOB over the night.  Stated she is not having too much SOB since she's up moving around and the breathing was better once she got another pillow.  Pt stated she took an extra torsemide this morning.  Pt informed RN will discuss with MD/PA for further instructions.  Pt verbalized understanding and agreed w/ plan.  Pt also stated she is supposed to have an echo and see Dr. Salena Saner in August.  Informed echo scheduled for 8.26 at 10am in our office.  Pt informed RN will set up her f/u appt when call returned.  Pt agreed.  Message forwarded to B. Leron Croak, PA-C for further instructions.

## 2012-07-26 NOTE — Telephone Encounter (Signed)
Chart# [id number]

## 2012-07-26 NOTE — Telephone Encounter (Signed)
Returned call.  No answer/voicemail.  Will try later.  

## 2012-07-27 NOTE — Telephone Encounter (Signed)
Returned call and informed [name] per instructions by MD/PA.  [name] verbalized understanding and agreed w/ plan.  [name] stated she will call back for her appt w/ Dr. Royann Shivers.

## 2012-08-03 ENCOUNTER — Encounter: Payer: Self-pay | Admitting: Cardiovascular Disease

## 2012-08-09 ENCOUNTER — Ambulatory Visit (INDEPENDENT_AMBULATORY_CARE_PROVIDER_SITE_OTHER): Payer: Self-pay | Admitting: Pharmacist Clinician (PhC)/ Clinical Pharmacy Specialist

## 2012-08-09 DIAGNOSIS — I4891 Unspecified atrial fibrillation: Secondary | ICD-10-CM

## 2012-08-09 DIAGNOSIS — Z7901 Long term (current) use of anticoagulants: Secondary | ICD-10-CM

## 2012-08-09 DIAGNOSIS — I639 Cerebral infarction, unspecified: Secondary | ICD-10-CM

## 2012-08-09 DIAGNOSIS — I6350 Cerebral infarction due to unspecified occlusion or stenosis of unspecified cerebral artery: Secondary | ICD-10-CM

## 2012-08-09 LAB — POCT INR: INR: 2.9

## 2012-08-23 LAB — POCT INR: INR: 2.4

## 2012-08-24 ENCOUNTER — Telehealth: Payer: Self-pay | Admitting: Cardiovascular Disease

## 2012-08-24 NOTE — Telephone Encounter (Signed)
Returned call.  Pt stated the torsemide isn't working the way it used to and she gains 3 lbs every few days.  Wants to know if she can get a refill to take an extra pill every other day.  Pt informed Dr. Royann Shivers will be notified for further instructions.  Pt verbalized understanding and agreed w/ plan.  Message forwarded to Dr. Royann Shivers.

## 2012-08-24 NOTE — Telephone Encounter (Signed)
Please call-concerning her medicine. °

## 2012-08-25 ENCOUNTER — Ambulatory Visit (INDEPENDENT_AMBULATORY_CARE_PROVIDER_SITE_OTHER): Payer: Self-pay | Admitting: Pharmacist

## 2012-08-25 DIAGNOSIS — I639 Cerebral infarction, unspecified: Secondary | ICD-10-CM

## 2012-08-25 DIAGNOSIS — Z7901 Long term (current) use of anticoagulants: Secondary | ICD-10-CM

## 2012-08-25 DIAGNOSIS — I6350 Cerebral infarction due to unspecified occlusion or stenosis of unspecified cerebral artery: Secondary | ICD-10-CM

## 2012-08-25 DIAGNOSIS — I4891 Unspecified atrial fibrillation: Secondary | ICD-10-CM

## 2012-08-25 MED ORDER — POTASSIUM CHLORIDE CRYS ER 20 MEQ PO TBCR: 20.0000 meq | EXTENDED_RELEASE_TABLET | Freq: Every day | ORAL | Status: DC

## 2012-08-25 MED ORDER — TORSEMIDE 20 MG PO TABS: 20.0000 mg | ORAL_TABLET | Freq: Every day | ORAL | Status: DC

## 2012-08-25 NOTE — Telephone Encounter (Signed)
Returned call.  No answer/voicemail.  Refill(s) sent to pharmacy on file.  Will await return call from pt if changes needed.

## 2012-08-25 NOTE — Telephone Encounter (Signed)
OK to double on torsemide temporarily until weight back to baseline, but on days she doubles the dose she should also take KCl 20 mEq daily - she may need that called in.

## 2012-08-25 NOTE — Telephone Encounter (Signed)
Returned call.  No answer/voicemail.  Will try later.  

## 2012-09-06 LAB — POCT INR: INR: 2

## 2012-09-09 ENCOUNTER — Ambulatory Visit (INDEPENDENT_AMBULATORY_CARE_PROVIDER_SITE_OTHER): Payer: Self-pay | Admitting: Pharmacist Clinician (PhC)/ Clinical Pharmacy Specialist

## 2012-09-09 DIAGNOSIS — I639 Cerebral infarction, unspecified: Secondary | ICD-10-CM

## 2012-09-09 DIAGNOSIS — I4891 Unspecified atrial fibrillation: Secondary | ICD-10-CM

## 2012-09-09 DIAGNOSIS — Z7901 Long term (current) use of anticoagulants: Secondary | ICD-10-CM

## 2012-09-09 DIAGNOSIS — I6350 Cerebral infarction due to unspecified occlusion or stenosis of unspecified cerebral artery: Secondary | ICD-10-CM

## 2012-09-20 ENCOUNTER — Ambulatory Visit (HOSPITAL_COMMUNITY)
Admission: RE | Admit: 2012-09-20 | Discharge: 2012-09-20 | Disposition: A | Discharge status: Home / Self Care | Payer: Medicare Other | Source: Ambulatory Visit | Attending: Cardiology | Admitting: Cardiology

## 2012-09-20 DIAGNOSIS — Z8673 Personal history of transient ischemic attack (TIA), and cerebral infarction without residual deficits: Secondary | ICD-10-CM | POA: Insufficient documentation

## 2012-09-20 DIAGNOSIS — I059 Rheumatic mitral valve disease, unspecified: Secondary | ICD-10-CM | POA: Insufficient documentation

## 2012-09-20 DIAGNOSIS — I4891 Unspecified atrial fibrillation: Secondary | ICD-10-CM | POA: Insufficient documentation

## 2012-09-20 DIAGNOSIS — I079 Rheumatic tricuspid valve disease, unspecified: Secondary | ICD-10-CM | POA: Insufficient documentation

## 2012-09-20 DIAGNOSIS — I10 Essential (primary) hypertension: Secondary | ICD-10-CM | POA: Insufficient documentation

## 2012-09-20 DIAGNOSIS — I517 Cardiomegaly: Secondary | ICD-10-CM | POA: Insufficient documentation

## 2012-09-20 DIAGNOSIS — I313 Pericardial effusion (noninflammatory): Secondary | ICD-10-CM

## 2012-09-20 DIAGNOSIS — Z950 Presence of cardiac pacemaker: Secondary | ICD-10-CM | POA: Insufficient documentation

## 2012-09-20 DIAGNOSIS — I319 Disease of pericardium, unspecified: Secondary | ICD-10-CM

## 2012-09-20 DIAGNOSIS — I359 Nonrheumatic aortic valve disorder, unspecified: Secondary | ICD-10-CM | POA: Insufficient documentation

## 2012-09-20 NOTE — Progress Notes (Signed)
2D Echo Performed 09/20/2012    Anaya Bovee, RCS  

## 2012-09-21 ENCOUNTER — Encounter: Payer: Self-pay | Admitting: *Deleted

## 2012-09-21 ENCOUNTER — Ambulatory Visit (INDEPENDENT_AMBULATORY_CARE_PROVIDER_SITE_OTHER): Payer: Self-pay | Admitting: Pharmacist Clinician (PhC)/ Clinical Pharmacy Specialist

## 2012-09-21 DIAGNOSIS — I6350 Cerebral infarction due to unspecified occlusion or stenosis of unspecified cerebral artery: Secondary | ICD-10-CM

## 2012-09-21 DIAGNOSIS — I4891 Unspecified atrial fibrillation: Secondary | ICD-10-CM

## 2012-09-21 DIAGNOSIS — Z7901 Long term (current) use of anticoagulants: Secondary | ICD-10-CM

## 2012-09-21 DIAGNOSIS — I639 Cerebral infarction, unspecified: Secondary | ICD-10-CM

## 2012-09-27 ENCOUNTER — Ambulatory Visit (INDEPENDENT_AMBULATORY_CARE_PROVIDER_SITE_OTHER): Payer: Medicare Other | Admitting: Cardiovascular Disease

## 2012-09-27 ENCOUNTER — Encounter: Payer: Self-pay | Admitting: Cardiovascular Disease

## 2012-09-27 VITALS — BP 134/78 | HR 86 | Resp 18 | Ht 65.0 in | Wt 157.5 lb

## 2012-09-27 DIAGNOSIS — Z950 Presence of cardiac pacemaker: Secondary | ICD-10-CM

## 2012-09-27 DIAGNOSIS — I313 Pericardial effusion (noninflammatory): Secondary | ICD-10-CM

## 2012-09-27 DIAGNOSIS — I4891 Unspecified atrial fibrillation: Secondary | ICD-10-CM

## 2012-09-27 DIAGNOSIS — I10 Essential (primary) hypertension: Secondary | ICD-10-CM

## 2012-09-27 DIAGNOSIS — I319 Disease of pericardium, unspecified: Secondary | ICD-10-CM

## 2012-09-27 LAB — COMPREHENSIVE METABOLIC PANEL
AST: 21 U/L (ref 0–37)
Albumin: 4.3 g/dL (ref 3.5–5.2)
BUN: 14 mg/dL (ref 6–23)
Calcium: 10.4 mg/dL (ref 8.4–10.5)
Chloride: 99 mEq/L (ref 96–112)
Glucose, Bld: 94 mg/dL (ref 70–99)
Potassium: 4.7 mEq/L (ref 3.5–5.3)
Sodium: 140 mEq/L (ref 135–145)
Total Protein: 7.1 g/dL (ref 6.0–8.3)

## 2012-09-27 LAB — CBC
Hemoglobin: 14.3 g/dL (ref 12.0–15.0)
MCH: 29.9 pg (ref 26.0–34.0)
MCHC: 34.1 g/dL (ref 30.0–36.0)
Platelets: 278 10*3/uL (ref 150–400)
RBC: 4.78 MIL/uL (ref 3.87–5.11)

## 2012-09-27 LAB — ANGIOTENSIN CONVERTING ENZYME: Angiotensin-Converting Enzyme: 46 U/L (ref 8–52)

## 2012-09-27 LAB — PACEMAKER DEVICE OBSERVATION

## 2012-09-27 LAB — SEDIMENTATION RATE: Sed Rate: 10 mm/hr (ref 0–22)

## 2012-09-27 MED ORDER — POTASSIUM CHLORIDE CRYS ER 10 MEQ PO TBCR: 20.0000 meq | EXTENDED_RELEASE_TABLET | Freq: Every day | ORAL | Status: DC

## 2012-09-27 NOTE — Progress Notes (Signed)
In office pacemaker interrogation. Minimal changes made this session.

## 2012-09-27 NOTE — Patient Instructions (Addendum)
Your physician recommends that you return for lab work performed as soon as possible.   Your physician has requested that you have an echocardiogram. Echocardiography is a painless test that uses sound waves to create images of your heart. It provides your doctor with information about the size and shape of your heart and how well your heart's chambers and valves are working. This procedure takes approximately one hour. There are no restrictions for this procedure.  Your physician recommends that you schedule a follow-up appointment in: 6 months

## 2012-09-28 LAB — PACEMAKER DEVICE OBSERVATION
BATTERY VOLTAGE: 2.73 V
BMOD-0005RV: 105 {beats}/min
BRDY-0002RV: 60 {beats}/min
RV LEAD AMPLITUDE: 5.6 mv
RV LEAD IMPEDENCE PM: 454 Ohm
RV LEAD THRESHOLD: 0.5 V

## 2012-10-01 ENCOUNTER — Encounter: Payer: Self-pay | Admitting: Cardiovascular Disease

## 2012-10-01 NOTE — Assessment & Plan Note (Signed)
The etiology of the effusion remains unexplained. Previous workup has suggested a noninflammatory etiology. Chest CT has not shown any clues. She does not have other signs of rheumatological disease. There is no evidence of malignancy that we can detect. Her pacemaker was placed many years ago. Her request to undergo workup for sarcoidosis is not a reasonable, but this diagnosis is doubtful in the absence of mediastinal lymphadenopathy on her chest CT. I think repeating a sedimentation rate might also be a good idea. We will check an ACE level. Thankfully, the effusion remains hemodynamically unimportant. There is no evidence of symptoms of reduced cardiac output or increased filling pressures and there are no echocardiographic signs of temp. Under the circumstances I think it is still safe for to defer pericardiocentesis, especially since she is on warfarin anticoagulation. She has atrial fibrillation and a history of previous embolic stroke. Continuing the anticoagulation for any procedure should not be undertaken lightly.

## 2012-10-01 NOTE — Assessment & Plan Note (Signed)
Good control on current medications. 

## 2012-10-01 NOTE — Progress Notes (Signed)
Patient ID: [name], female   DOB: 1923/05/05, 79 y.o.   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 She has permanent atrial fibrillation and is on warfarin anticoagulation. She had a presumed embolic stroke in May of 2013 when her anticoagulation was slightly subtherapeutic at 1.7. She has also had a history of gastrointestinal bleeding from arteriovenous malformations when super therapeutically anticoagulated. She is currently on warfarin without any  Recent bleeding events.  A pericardial effusion was initially identified in 2012 and described as trivial,  it was small to moderate on the echocardiogram performed in 07/02/2011 and probably unchanged but described as moderate on the echocardiogram performed 07/28/2011. On a followup echocardiogram performed just before this visit the pericardial effusion appears to be moderate to large. It is echo free and free flowing and again there are no signs of tamponade. It is noteworthy that a CT of the chest performed last year described a more generalized syndrome of fluid overload with pleural effusions and small amount of ascites.  Has not had fever, chills, weight loss, cough, pleuritic or anginal chest pain, active bleeding and there is no worsening of her chronic New York Heart Association functional class II shortness of breath on exertion. She remains active taking care of her entire household and her lawn without assistance.    Allergies  Allergen Reactions  . Iodinated Diagnostic Agents Hives    HIVES S/P IV CONTRAST INJECTION,WILL NEED 13 HR PREP FOR FUTURE INJECTIONS, ok s/p 50mg  po benadryl//a.calhoun  . Anti-Inflammatory Enzyme [Nutritional Supplements]     Retains fluids and headaches  . Arthrotec [Diclofenac-Misoprostol] Other (See Comments)    unknown  . Biaxin [Clarithromycin] Other (See Comments)    unknown  . Plavix  [Clopidogrel Bisulfate] Other (See Comments)    unknown  . Spironolactone     Hair loss    Current Outpatient Prescriptions  Medication Sig Dispense Refill  . codeine 15 MG tablet Take 1 tablet (15 mg total) by mouth every 8 (eight) hours as needed for pain.  60 tablet  0  . hydrocortisone (ANUSOL-HC) 25 MG suppository Place 1 suppository (25 mg total) rectally 2 (two) times daily.  24 suppository  4  . hydrocortisone (PROCTOSOL HC) 2.5 % rectal cream Place 1 application rectally 2 (two) times daily.  30 g  5  . LANOXIN 125 MCG tablet take 1 tablet by mouth once daily  30 tablet  5  . methocarbamol (ROBAXIN) 500 MG tablet One  q 8 hrs prn spasm  30 tablet  0  . multivitamin (THERAGRAN) per tablet Take 1 tablet by mouth daily.        . polyethylene glycol powder (GLYCOLAX/MIRALAX) powder Take 17 g by mouth Once daily as needed.      . potassium chloride (KLOR-CON M20) 10 MEQ tablet Take 2 tablets (20 mEq total) by mouth daily. When you take Torsemide.  180 tablet  11  . torsemide (DEMADEX) 20 MG tablet Take 1 tablet (20 mg total) by mouth daily. Take an additional 20 mg daily as needed for swelling.  45 tablet  3  . verapamil (CALAN-SR) 180 MG CR tablet take 1 tablet by mouth at bedtime  30 tablet  6  . warfarin (COUMADIN) 5 MG tablet Take 5 mg by mouth daily. Was taking 5 mg qd for six days a week and 2.5 mg 1 day.  Coumadin was held during hospital stay. Starts back today.      . pantoprazole (PROTONIX) 40 MG tablet take 1 tablet by mouth once daily at noon  30 tablet  1  . traMADol (ULTRAM) 50 MG tablet Take 50 mg by mouth Daily.       No current facility-administered medications for this visit.    Past Medical History  Diagnosis Date  . Pericardial effusion     Chronic. Last echo in April of 2012.   . Atrial fibrillation     Chronic  . Chronic anticoagulation     on Coumadin  . History of GI bleed     once treated with Fe infusion  . HTN (hypertension)   . Sleep apnea   .  Pacemaker     placed for tachybrady.   . Valvular heart disease   . Obesity   . LVH (left ventricular hypertrophy)   . Stroke April 2013    right frontal  . Thrombophlebitis 1967    associated with pregnancy  . Diverticulosis   . Heart murmur   . Anemia   . Blood transfusion without reported diagnosis   . Osteoporosis   . Cataract   . Hx of echocardiogram 07/28/2011    EF>55% small to moderate and is mostly located posteriorly. there is no overt evidence of tamponade although subtle diastolic collapse of the right ventricle.    Past Surgical History  Procedure Laterality Date  . Pacemaker insertion  2001    Last generator in 2006; interrogated Dec 2012  . Breast lumpectomy      bilaterally; negative for cancer  . Tonsillectomy  1950  . Adenoidectomy    . Esophagogastroduodenoscopy  05/19/2011    Procedure: ESOPHAGOGASTRODUODENOSCOPY (EGD);  Surgeon: Shirley Friar, MD;  Location: [facility] ENDOSCOPY;  Service: Endoscopy;  Laterality: N/A;  doctor aware of inr   will try to be here no later than 230  . Cholecystectomy  2005  . Esophagogastroduodenoscopy  06/22/2011    Procedure: ESOPHAGOGASTRODUODENOSCOPY (EGD);  Surgeon: Willis Modena, MD;  Location: [facility] ENDOSCOPY;  Service: Endoscopy;  Laterality: N/A;  Check PT/INR in am  . Givens capsule study  06/23/2011    Procedure: GIVENS CAPSULE STUDY;  Surgeon: Willis Modena, MD;  Location: [facility] ENDOSCOPY;  Service: Endoscopy;  Laterality: N/A;  . Breast lumpectomy      left breast  . Colonoscopy  08/11/2011    Procedure: COLONOSCOPY;  Surgeon: Petra Kuba, MD;  Location: [facility] ENDOSCOPY;  Service: Endoscopy;  Laterality: N/A;  . Hot hemostasis  08/11/2011    Procedure: HOT HEMOSTASIS (ARGON PLASMA COAGULATION/BICAP);  Surgeon: Petra Kuba, MD;  Location: [facility] ENDOSCOPY;  Service: Endoscopy;  Laterality: N/A;    Family History  Problem Relation Age of Onset  . Stroke Mother   . Stroke Father   . Pneumonia Father     History   Social  History  . Marital Status: Widowed    Spouse Name: N/A    Number of Children: N/A  . Years of Education: N/A   Occupational History  . Not on file.   Social History Main Topics  . Smoking status: Never Smoker   . Smokeless tobacco: Never Used  . Alcohol Use: No  . Drug Use: No  . Sexual Activity: No   Other Topics Concern  . Not on file   Social History Narrative  . No narrative on file    Review of systems: The patient specifically denies any chest  pain at rest or with exertion, dyspnea at rest, orthopnea, paroxysmal nocturnal dyspnea, syncope, palpitations, focal neurological deficits, intermittent claudication, lower extremity edema, unexplained weight gain, cough, hemoptysis or wheezing.  The patient also denies abdominal pain, nausea, vomiting, dysphagia, diarrhea, constipation, polyuria, polydipsia, dysuria, hematuria, frequency, urgency, abnormal bleeding or bruising, fever, chills, unexpected weight changes, mood swings, change in skin or hair texture, change in voice quality, auditory or visual problems, allergic reactions or rashes, new musculoskeletal complaints other than usual "aches and pains".   PHYSICAL EXAM BP 134/78  Pulse 86  Resp 18  Ht 5\' 5"  (1.651 m)  Wt 157 lb 8 oz (71.442 kg)  BMI 26.21 kg/m2  General: Alert, oriented x3, no distress Head: no evidence of trauma, PERRL, EOMI, no exophtalmos or lid lag, no myxedema, no xanthelasma; normal ears, nose and oropharynx Neck: normal jugular venous pulsations and no hepatojugular reflux; brisk carotid pulses without delay and no carotid bruits Chest: clear to auscultation, no signs of consolidation by percussion or palpation, normal fremitus, symmetrical and full respiratory excursions Cardiovascular: normal position and quality of the apical impulse, regular rhythm, normal first and paradoxically split second heart sounds, no murmurs, rubs or gallops Abdomen: no tenderness or distention, no masses by  palpation, no abnormal pulsatility or arterial bruits, normal bowel sounds, no hepatosplenomegaly Extremities: no clubbing, cyanosis or edema; 2+ radial, ulnar and brachial pulses bilaterally; 2+ right femoral, posterior tibial and dorsalis pedis pulses; 2+ left femoral, posterior tibial and dorsalis pedis pulses; no subclavian or femoral bruits Neurological: There is very mild left facial droop and slightly weaker grip on the left side    EKG: Background atrial fibrillation, 100% ventricular pacing  Lipid Panel     Component Value Date/Time   CHOL 141 05/17/2011 0920   TRIG 107 05/17/2011 0920   HDL 30* 05/17/2011 0920   CHOLHDL 4.7 05/17/2011 0920   VLDL 21 05/17/2011 0920   LDLCALC 90 05/17/2011 0920    BMET    Component Value Date/Time   NA 140 09/27/2012 1156   K 4.7 09/27/2012 1156   CL 99 09/27/2012 1156   CO2 35* 09/27/2012 1156   GLUCOSE 94 09/27/2012 1156   BUN 14 09/27/2012 1156   CREATININE 1.01 09/27/2012 1156   CREATININE 1.10 07/02/2011 0509   CALCIUM 10.4 09/27/2012 1156   GFRNONAA 44* 07/02/2011 0509   GFRAA 51* 07/02/2011 0509     ASSESSMENT AND PLAN Pericardial effusion, Large The etiology of the effusion remains unexplained. Previous workup has suggested a noninflammatory etiology. Chest CT has not shown any clues. She does not have other signs of rheumatological disease. There is no evidence of malignancy that we can detect. Her pacemaker was placed many years ago. Her request to undergo workup for sarcoidosis is not a reasonable, but this diagnosis is doubtful in the absence of mediastinal lymphadenopathy on her chest CT. I think repeating a sedimentation rate might also be a good idea. We will check an ACE level. Thankfully, the effusion remains hemodynamically unimportant. There is no evidence of symptoms of reduced cardiac output or increased filling pressures and there are no echocardiographic signs of temp. Under the circumstances I think it is still safe for to defer  pericardiocentesis, especially since she is on warfarin anticoagulation. She has atrial fibrillation and a history of previous embolic stroke. Continuing the anticoagulation for any procedure should not be undertaken lightly.  Pacemaker She has a dual-chamber Medtronic pacemaker that was implanted in 2006. It is now programmed VVIR  since she has permanent atrial fibrillation. Device function is normal by a full check.  HTN (hypertension) Good control on current medications  Plan followup echocardiography and clinical evaluation in 6 months  Orders Placed This Encounter  Procedures  . Comp Met (CMET)  . CBC  . Sed Rate (ESR)  . Angiotensin converting enzyme  . Pacemaker Device Observation  . EKG 12-Lead  . 2D Echocardiogram with contrast   Meds ordered this encounter  Medications  . potassium chloride (KLOR-CON M20) 10 MEQ tablet    Sig: Take 2 tablets (20 mEq total) by mouth daily. When you take Torsemide.    Dispense:  180 tablet    Refill:  11    Romen Yutzy  Thurmon Fair, MD, [facility] [phone] office [phone] pager

## 2012-10-01 NOTE — Assessment & Plan Note (Addendum)
She has a dual-chamber Medtronic pacemaker that was implanted in 2006. It is now programmed VVIR since she has permanent atrial fibrillation. Had a very slow deterioration in heart rate amplitude as sensed by the ventricular lead and currently R waves only measure roughly 2 mV. It seems that we still have some room for safe device programming. She paces the ventricle roughly 75% of the time, and this is no different from historical trends.

## 2012-10-04 ENCOUNTER — Ambulatory Visit (INDEPENDENT_AMBULATORY_CARE_PROVIDER_SITE_OTHER): Payer: Medicare Other | Admitting: Pharmacist Clinician (PhC)/ Clinical Pharmacy Specialist

## 2012-10-04 DIAGNOSIS — I639 Cerebral infarction, unspecified: Secondary | ICD-10-CM

## 2012-10-04 DIAGNOSIS — I4891 Unspecified atrial fibrillation: Secondary | ICD-10-CM

## 2012-10-04 DIAGNOSIS — I6350 Cerebral infarction due to unspecified occlusion or stenosis of unspecified cerebral artery: Secondary | ICD-10-CM

## 2012-10-04 DIAGNOSIS — Z7901 Long term (current) use of anticoagulants: Secondary | ICD-10-CM

## 2012-10-04 LAB — POCT INR: INR: 1.9

## 2012-10-11 LAB — POCT INR: INR: 2.6

## 2012-10-18 ENCOUNTER — Ambulatory Visit (INDEPENDENT_AMBULATORY_CARE_PROVIDER_SITE_OTHER): Payer: Medicare Other | Admitting: Pharmacist Clinician (PhC)/ Clinical Pharmacy Specialist

## 2012-10-18 DIAGNOSIS — I6350 Cerebral infarction due to unspecified occlusion or stenosis of unspecified cerebral artery: Secondary | ICD-10-CM

## 2012-10-18 DIAGNOSIS — Z7901 Long term (current) use of anticoagulants: Secondary | ICD-10-CM

## 2012-10-18 DIAGNOSIS — I4891 Unspecified atrial fibrillation: Secondary | ICD-10-CM

## 2012-10-18 DIAGNOSIS — I639 Cerebral infarction, unspecified: Secondary | ICD-10-CM

## 2012-10-18 LAB — POCT INR: INR: 2.2

## 2012-11-01 ENCOUNTER — Ambulatory Visit (INDEPENDENT_AMBULATORY_CARE_PROVIDER_SITE_OTHER): Payer: Medicare Other | Admitting: Pharmacist Clinician (PhC)/ Clinical Pharmacy Specialist

## 2012-11-01 DIAGNOSIS — I6350 Cerebral infarction due to unspecified occlusion or stenosis of unspecified cerebral artery: Secondary | ICD-10-CM

## 2012-11-01 DIAGNOSIS — Z7901 Long term (current) use of anticoagulants: Secondary | ICD-10-CM

## 2012-11-01 DIAGNOSIS — I639 Cerebral infarction, unspecified: Secondary | ICD-10-CM

## 2012-11-01 DIAGNOSIS — I4891 Unspecified atrial fibrillation: Secondary | ICD-10-CM

## 2012-11-01 LAB — POCT INR: INR: 2.3

## 2012-11-08 LAB — POCT INR: INR: 2.4

## 2012-11-15 ENCOUNTER — Ambulatory Visit (INDEPENDENT_AMBULATORY_CARE_PROVIDER_SITE_OTHER): Payer: Medicare Other | Admitting: Pharmacist Clinician (PhC)/ Clinical Pharmacy Specialist

## 2012-11-15 DIAGNOSIS — I4891 Unspecified atrial fibrillation: Secondary | ICD-10-CM

## 2012-11-15 DIAGNOSIS — Z7901 Long term (current) use of anticoagulants: Secondary | ICD-10-CM

## 2012-11-15 DIAGNOSIS — I6350 Cerebral infarction due to unspecified occlusion or stenosis of unspecified cerebral artery: Secondary | ICD-10-CM

## 2012-11-15 DIAGNOSIS — I639 Cerebral infarction, unspecified: Secondary | ICD-10-CM

## 2012-11-25 ENCOUNTER — Telehealth: Payer: Self-pay | Admitting: *Deleted

## 2012-11-25 NOTE — Telephone Encounter (Signed)
No answer.  Calling patient to let her know her insurance will not cover in 2015  brand Coumadin ( ok to switch to Warfarin) and Bystolic (ok to switch to Carvedilol 12.5mg  BID.  No answer at patient's # or daughters #'s.

## 2012-11-29 ENCOUNTER — Other Ambulatory Visit: Payer: Self-pay | Admitting: Emergency Medicine

## 2012-11-29 ENCOUNTER — Telehealth: Payer: Self-pay | Admitting: *Deleted

## 2012-11-29 MED ORDER — WARFARIN SODIUM 5 MG PO TABS: ORAL_TABLET | ORAL | Status: DC

## 2012-11-29 NOTE — Telephone Encounter (Signed)
Coumadin switched to Warfarin at the insurance request.  Patient notified and voiced understanding.  Dr. Salena Saner authorized this change.

## 2012-12-01 ENCOUNTER — Ambulatory Visit (INDEPENDENT_AMBULATORY_CARE_PROVIDER_SITE_OTHER): Payer: Medicare Other | Admitting: Pharmacist Clinician (PhC)/ Clinical Pharmacy Specialist

## 2012-12-01 DIAGNOSIS — I639 Cerebral infarction, unspecified: Secondary | ICD-10-CM

## 2012-12-01 DIAGNOSIS — I6350 Cerebral infarction due to unspecified occlusion or stenosis of unspecified cerebral artery: Secondary | ICD-10-CM

## 2012-12-01 DIAGNOSIS — Z7901 Long term (current) use of anticoagulants: Secondary | ICD-10-CM

## 2012-12-01 DIAGNOSIS — I4891 Unspecified atrial fibrillation: Secondary | ICD-10-CM

## 2012-12-06 ENCOUNTER — Telehealth: Payer: Self-pay | Admitting: Radiology

## 2012-12-06 DIAGNOSIS — Z79899 Other long term (current) drug therapy: Secondary | ICD-10-CM

## 2012-12-06 NOTE — Telephone Encounter (Signed)
Called patient Dr [name] has gotten a letter from her insurance company. Her lanoxin and Verapamil may interact. He wants to check her lanoxin level. I have spoken to her and advised her, she was transferred to make appt. Have pended the future order for this lab as well.

## 2012-12-13 LAB — POCT INR: INR: 2.4

## 2012-12-15 ENCOUNTER — Ambulatory Visit (INDEPENDENT_AMBULATORY_CARE_PROVIDER_SITE_OTHER): Payer: Medicare Other | Admitting: Pharmacist Clinician (PhC)/ Clinical Pharmacy Specialist

## 2012-12-15 DIAGNOSIS — I6350 Cerebral infarction due to unspecified occlusion or stenosis of unspecified cerebral artery: Secondary | ICD-10-CM

## 2012-12-15 DIAGNOSIS — I4891 Unspecified atrial fibrillation: Secondary | ICD-10-CM

## 2012-12-15 DIAGNOSIS — Z7901 Long term (current) use of anticoagulants: Secondary | ICD-10-CM

## 2012-12-15 DIAGNOSIS — I639 Cerebral infarction, unspecified: Secondary | ICD-10-CM

## 2012-12-27 ENCOUNTER — Encounter: Payer: Self-pay | Admitting: Emergency Medicine

## 2012-12-27 ENCOUNTER — Ambulatory Visit (INDEPENDENT_AMBULATORY_CARE_PROVIDER_SITE_OTHER): Payer: Medicare Other | Admitting: Emergency Medicine

## 2012-12-27 VITALS — BP 115/70 | HR 90 | Temp 97.6°F | Resp 16 | Ht 64.5 in | Wt 158.0 lb

## 2012-12-27 DIAGNOSIS — I509 Heart failure, unspecified: Secondary | ICD-10-CM

## 2012-12-27 DIAGNOSIS — Z9189 Other specified personal risk factors, not elsewhere classified: Secondary | ICD-10-CM

## 2012-12-27 DIAGNOSIS — Z23 Encounter for immunization: Secondary | ICD-10-CM

## 2012-12-27 DIAGNOSIS — Z9289 Personal history of other medical treatment: Secondary | ICD-10-CM

## 2012-12-27 DIAGNOSIS — I6350 Cerebral infarction due to unspecified occlusion or stenosis of unspecified cerebral artery: Secondary | ICD-10-CM

## 2012-12-27 DIAGNOSIS — I639 Cerebral infarction, unspecified: Secondary | ICD-10-CM

## 2012-12-27 DIAGNOSIS — R21 Rash and other nonspecific skin eruption: Secondary | ICD-10-CM

## 2012-12-27 DIAGNOSIS — R5381 Other malaise: Secondary | ICD-10-CM

## 2012-12-27 LAB — COMPREHENSIVE METABOLIC PANEL
ALT: 19 U/L (ref 0–35)
BUN: 16 mg/dL (ref 6–23)
CO2: 32 mEq/L (ref 19–32)
Calcium: 9.7 mg/dL (ref 8.4–10.5)
Chloride: 99 mEq/L (ref 96–112)
Creat: 0.86 mg/dL (ref 0.50–1.10)
Glucose, Bld: 95 mg/dL (ref 70–99)
Sodium: 142 mEq/L (ref 135–145)
Total Protein: 7.6 g/dL (ref 6.0–8.3)

## 2012-12-27 LAB — CBC WITH DIFFERENTIAL/PLATELET
Eosinophils Relative: 3 % (ref 0–5)
Hemoglobin: 15.3 g/dL — ABNORMAL HIGH (ref 12.0–15.0)
Lymphocytes Relative: 23 % (ref 12–46)
Lymphs Abs: 1.6 10*3/uL (ref 0.7–4.0)
MCV: 85.7 fL (ref 78.0–100.0)
Monocytes Relative: 10 % (ref 3–12)
Neutrophils Relative %: 64 % (ref 43–77)
Platelets: 291 10*3/uL (ref 150–400)
RBC: 5.16 MIL/uL — ABNORMAL HIGH (ref 3.87–5.11)
WBC: 7 10*3/uL (ref 4.0–10.5)

## 2012-12-27 MED ORDER — TRIAMCINOLONE 0.1 % CREAM:EUCERIN CREAM 1:1: 1.0000 "application " | TOPICAL_CREAM | Freq: Two times a day (BID) | CUTANEOUS | Status: DC | PRN

## 2012-12-27 NOTE — Progress Notes (Signed)
   Subjective:    Patient ID: [name], female    DOB: 08/31/1923, 79 y.o.   MRN: [id number]  HPI patient enters at my request to have a dig level done. Apparently this had not been done for a while and we called he should come in for this. She sees Dr.Critorui as her cardiologist. She had an echocardiogram the end of August which showed a moderate to large pericardial effusion. There was no hemodynamic compromise on that study. She states she feels well. She does have some issues with depression over family issues but overall she denies any recent chest pain. shortness of breath, or other problems.    Review of Systems     Objective:   Physical Exam patient is alert and cooperative. Neck is supple. Chest clear. Cardiac exam heart sounds were clearly audible. There is a 2/6 systolic murmur. Abdomen is soft nontender extremity exam reveals mild edema. She does have stasis changes of both legs but posterior tibial pulses are palpable at 2+        Assessment & Plan:  Routine labs were done today. She requested a hep C test since she had a transfusion . I did prescribe Eucerin and triamcinolone mixed as a cream for her legs.

## 2012-12-28 LAB — HEPATITIS C ANTIBODY: HCV Ab: NEGATIVE

## 2012-12-28 LAB — DIGOXIN LEVEL: Digoxin Level: 1 ng/mL (ref 0.8–2.0)

## 2012-12-29 ENCOUNTER — Encounter: Payer: Self-pay | Admitting: *Deleted

## 2012-12-29 ENCOUNTER — Ambulatory Visit (INDEPENDENT_AMBULATORY_CARE_PROVIDER_SITE_OTHER): Payer: Medicare Other | Admitting: Pharmacist Clinician (PhC)/ Clinical Pharmacy Specialist

## 2012-12-29 DIAGNOSIS — I4891 Unspecified atrial fibrillation: Secondary | ICD-10-CM

## 2012-12-29 DIAGNOSIS — I639 Cerebral infarction, unspecified: Secondary | ICD-10-CM

## 2012-12-29 DIAGNOSIS — Z7901 Long term (current) use of anticoagulants: Secondary | ICD-10-CM

## 2012-12-29 DIAGNOSIS — I6350 Cerebral infarction due to unspecified occlusion or stenosis of unspecified cerebral artery: Secondary | ICD-10-CM

## 2013-01-04 ENCOUNTER — Telehealth: Payer: Self-pay

## 2013-01-04 NOTE — Telephone Encounter (Signed)
Pt would like a copy of her latest lab results mailed to her home.

## 2013-01-05 NOTE — Telephone Encounter (Signed)
Per Vernona Rieger in lab under epic in lab results, patient was notified that a copy was already mailed to her.

## 2013-01-10 ENCOUNTER — Ambulatory Visit (INDEPENDENT_AMBULATORY_CARE_PROVIDER_SITE_OTHER): Payer: Medicare Other | Admitting: Pharmacist Clinician (PhC)/ Clinical Pharmacy Specialist

## 2013-01-10 DIAGNOSIS — I4891 Unspecified atrial fibrillation: Secondary | ICD-10-CM

## 2013-01-10 DIAGNOSIS — I6350 Cerebral infarction due to unspecified occlusion or stenosis of unspecified cerebral artery: Secondary | ICD-10-CM

## 2013-01-10 DIAGNOSIS — I639 Cerebral infarction, unspecified: Secondary | ICD-10-CM

## 2013-01-10 DIAGNOSIS — Z7901 Long term (current) use of anticoagulants: Secondary | ICD-10-CM

## 2013-01-10 LAB — POCT INR: INR: 2.6

## 2013-01-12 ENCOUNTER — Other Ambulatory Visit: Payer: Self-pay | Admitting: Emergency Medicine

## 2013-01-24 LAB — POCT INR
INR: 3.1
INR: 3.1
INR: 3.1
INR: 3.1

## 2013-01-25 ENCOUNTER — Ambulatory Visit (INDEPENDENT_AMBULATORY_CARE_PROVIDER_SITE_OTHER): Payer: Medicare Other | Admitting: Pharmacist Clinician (PhC)/ Clinical Pharmacy Specialist

## 2013-01-25 DIAGNOSIS — I639 Cerebral infarction, unspecified: Secondary | ICD-10-CM

## 2013-01-25 DIAGNOSIS — I6350 Cerebral infarction due to unspecified occlusion or stenosis of unspecified cerebral artery: Secondary | ICD-10-CM

## 2013-01-25 DIAGNOSIS — I4891 Unspecified atrial fibrillation: Secondary | ICD-10-CM

## 2013-01-25 DIAGNOSIS — Z7901 Long term (current) use of anticoagulants: Secondary | ICD-10-CM

## 2013-01-25 NOTE — Progress Notes (Signed)
INR 3.1, unable to record on anticoag track

## 2013-02-04 ENCOUNTER — Other Ambulatory Visit (HOSPITAL_COMMUNITY): Payer: Self-pay | Admitting: Cardiovascular Disease

## 2013-02-06 NOTE — Telephone Encounter (Signed)
Rx was sent to pharmacy electronically. 

## 2013-02-07 ENCOUNTER — Ambulatory Visit (INDEPENDENT_AMBULATORY_CARE_PROVIDER_SITE_OTHER): Payer: Medicare Other | Admitting: Pharmacist Clinician (PhC)/ Clinical Pharmacy Specialist

## 2013-02-07 DIAGNOSIS — I639 Cerebral infarction, unspecified: Secondary | ICD-10-CM

## 2013-02-07 DIAGNOSIS — I6350 Cerebral infarction due to unspecified occlusion or stenosis of unspecified cerebral artery: Secondary | ICD-10-CM

## 2013-02-07 DIAGNOSIS — Z7901 Long term (current) use of anticoagulants: Secondary | ICD-10-CM

## 2013-02-07 DIAGNOSIS — I4891 Unspecified atrial fibrillation: Secondary | ICD-10-CM

## 2013-02-07 LAB — POCT INR: INR: 2.6

## 2013-02-21 LAB — POCT INR: INR: 2

## 2013-02-22 ENCOUNTER — Ambulatory Visit (INDEPENDENT_AMBULATORY_CARE_PROVIDER_SITE_OTHER): Payer: Medicare Other | Admitting: Pharmacist Clinician (PhC)/ Clinical Pharmacy Specialist

## 2013-02-22 DIAGNOSIS — I4891 Unspecified atrial fibrillation: Secondary | ICD-10-CM

## 2013-02-22 DIAGNOSIS — Z7901 Long term (current) use of anticoagulants: Secondary | ICD-10-CM

## 2013-02-22 DIAGNOSIS — I639 Cerebral infarction, unspecified: Secondary | ICD-10-CM

## 2013-02-22 DIAGNOSIS — I6350 Cerebral infarction due to unspecified occlusion or stenosis of unspecified cerebral artery: Secondary | ICD-10-CM

## 2013-03-07 ENCOUNTER — Ambulatory Visit (INDEPENDENT_AMBULATORY_CARE_PROVIDER_SITE_OTHER): Payer: Medicare Other | Admitting: Pharmacist Clinician (PhC)/ Clinical Pharmacy Specialist

## 2013-03-07 DIAGNOSIS — I6350 Cerebral infarction due to unspecified occlusion or stenosis of unspecified cerebral artery: Secondary | ICD-10-CM

## 2013-03-07 DIAGNOSIS — Z7901 Long term (current) use of anticoagulants: Secondary | ICD-10-CM

## 2013-03-07 DIAGNOSIS — I639 Cerebral infarction, unspecified: Secondary | ICD-10-CM

## 2013-03-07 DIAGNOSIS — I4891 Unspecified atrial fibrillation: Secondary | ICD-10-CM

## 2013-03-07 LAB — POCT INR: INR: 2.8

## 2013-03-22 ENCOUNTER — Ambulatory Visit (INDEPENDENT_AMBULATORY_CARE_PROVIDER_SITE_OTHER): Payer: Medicare Other | Admitting: Pharmacist Clinician (PhC)/ Clinical Pharmacy Specialist

## 2013-03-22 DIAGNOSIS — I639 Cerebral infarction, unspecified: Secondary | ICD-10-CM

## 2013-03-22 DIAGNOSIS — I4891 Unspecified atrial fibrillation: Secondary | ICD-10-CM

## 2013-03-22 DIAGNOSIS — I6350 Cerebral infarction due to unspecified occlusion or stenosis of unspecified cerebral artery: Secondary | ICD-10-CM

## 2013-03-22 DIAGNOSIS — Z7901 Long term (current) use of anticoagulants: Secondary | ICD-10-CM

## 2013-03-22 LAB — POCT INR: INR: 2

## 2013-03-28 ENCOUNTER — Ambulatory Visit (INDEPENDENT_AMBULATORY_CARE_PROVIDER_SITE_OTHER): Payer: Medicare Other | Admitting: Emergency Medicine

## 2013-03-28 ENCOUNTER — Ambulatory Visit: Payer: Medicare Other | Admitting: Cardiovascular Disease

## 2013-03-28 ENCOUNTER — Encounter: Payer: Self-pay | Admitting: Emergency Medicine

## 2013-03-28 VITALS — BP 131/75 | HR 87 | Temp 97.9°F | Resp 16

## 2013-03-28 DIAGNOSIS — I10 Essential (primary) hypertension: Secondary | ICD-10-CM

## 2013-03-28 DIAGNOSIS — R109 Unspecified abdominal pain: Secondary | ICD-10-CM

## 2013-03-28 DIAGNOSIS — I639 Cerebral infarction, unspecified: Secondary | ICD-10-CM

## 2013-03-28 DIAGNOSIS — E559 Vitamin D deficiency, unspecified: Secondary | ICD-10-CM

## 2013-03-28 DIAGNOSIS — I4891 Unspecified atrial fibrillation: Secondary | ICD-10-CM

## 2013-03-28 DIAGNOSIS — I669 Occlusion and stenosis of unspecified cerebral artery: Secondary | ICD-10-CM

## 2013-03-28 LAB — CBC WITH DIFFERENTIAL/PLATELET
BASOS PCT: 0 % (ref 0–1)
Basophils Absolute: 0 10*3/uL (ref 0.0–0.1)
EOS PCT: 4 % (ref 0–5)
Eosinophils Absolute: 0.2 10*3/uL (ref 0.0–0.7)
HCT: 41.4 % (ref 36.0–46.0)
Hemoglobin: 14 g/dL (ref 12.0–15.0)
Lymphocytes Relative: 23 % (ref 12–46)
Lymphs Abs: 1.4 10*3/uL (ref 0.7–4.0)
MCH: 29.7 pg (ref 26.0–34.0)
MCHC: 33.8 g/dL (ref 30.0–36.0)
MCV: 87.7 fL (ref 78.0–100.0)
Monocytes Absolute: 0.5 10*3/uL (ref 0.1–1.0)
Monocytes Relative: 9 % (ref 3–12)
NEUTROS PCT: 64 % (ref 43–77)
Neutro Abs: 3.9 10*3/uL (ref 1.7–7.7)
Platelets: 283 10*3/uL (ref 150–400)
RBC: 4.72 MIL/uL (ref 3.87–5.11)
RDW: 13.4 % (ref 11.5–15.5)
WBC: 6.1 10*3/uL (ref 4.0–10.5)

## 2013-03-28 NOTE — Progress Notes (Signed)
Subjective:    Patient ID: [name], female    DOB: 01-May-1923, 80 y.o.   MRN: [id number]  HPI This chart was scribed for [name] by Smiley Houseman, Scribe. This patient was seen in room 23 and the patient's care was started at 11:22 AM.  HPI Comments: [name] is a 81 y.o. female with a h/o of Afib and CVA who presents to the [facility] for her HTN three month check up.  Pt states she is doing well overall.  She reports she has an uncomfortable feeling in her RUQ onset about 3 years ago.  Pt states her GI doctor is Dr. Ewing Schlein.  She states her last visit was around 2 years ago when she had a colonoscopy (08/11/11).  She reports she is constipated with associated rectal bleeding.  She states she has to use her fingers to remove the stool from her rectum, which causes irritation.  Pt's stroke occurred in 04/2011.    Pt states she has been taking Vitamin D daily.  She states she thinks since she started taking the Vitamin D she is sleeping better.    Pt also complains of her left arm aching constantly.  Pt denies the aching radiating into her fingers.  Pt states that pain is worse when she lifts objects or completing specific exercises at the gym.     Past Surgical History  Procedure Laterality Date  . Pacemaker insertion  2001    Last generator in 2006; interrogated Dec 2012  . Breast lumpectomy      bilaterally; negative for cancer  . Tonsillectomy  1950  . Adenoidectomy    . Esophagogastroduodenoscopy  05/19/2011    Procedure: ESOPHAGOGASTRODUODENOSCOPY (EGD);  Surgeon: Shirley Friar, MD;  Location: [facility] ENDOSCOPY;  Service: Endoscopy;  Laterality: N/A;  doctor aware of inr   will try to be here no later than 230  . Cholecystectomy  2005  . Esophagogastroduodenoscopy  06/22/2011    Procedure: ESOPHAGOGASTRODUODENOSCOPY (EGD);  Surgeon: Willis Modena, MD;  Location: [facility] ENDOSCOPY;  Service: Endoscopy;  Laterality: N/A;  Check PT/INR in am  .  Givens capsule study  06/23/2011    Procedure: GIVENS CAPSULE STUDY;  Surgeon: Willis Modena, MD;  Location: [facility] ENDOSCOPY;  Service: Endoscopy;  Laterality: N/A;  . Breast lumpectomy      left breast  . Colonoscopy  08/11/2011    Procedure: COLONOSCOPY;  Surgeon: Petra Kuba, MD;  Location: [facility] ENDOSCOPY;  Service: Endoscopy;  Laterality: N/A;  . Hot hemostasis  08/11/2011    Procedure: HOT HEMOSTASIS (ARGON PLASMA COAGULATION/BICAP);  Surgeon: Petra Kuba, MD;  Location: [facility] ENDOSCOPY;  Service: Endoscopy;  Laterality: N/A;    Family History  Problem Relation Age of Onset  . Stroke Mother   . Stroke Father   . Pneumonia Father     History   Social History  . Marital Status: Widowed    Spouse Name: N/A    Number of Children: N/A  . Years of Education: N/A   Occupational History  . Not on file.   Social History Main Topics  . Smoking status: Never Smoker   . Smokeless tobacco: Never Used  . Alcohol Use: No  . Drug Use: No  . Sexual Activity: No   Other Topics Concern  . Not on file   Social History Narrative  . No narrative on file    Allergies  Allergen Reactions  . Iodinated Diagnostic Agents Hives  HIVES 15MIN S/P IV CONTRAST INJECTION,WILL NEED 13 HR PREP FOR FUTURE INJECTIONS, ok s/p 50mg  po benadryl//a.calhoun  . Anti-Inflammatory Enzyme [Nutritional Supplements]     Retains fluids and headaches  . Arthrotec [Diclofenac-Misoprostol] Other (See Comments)    unknown  . Biaxin [Clarithromycin] Other (See Comments)    unknown  . Plavix [Clopidogrel Bisulfate] Other (See Comments)    unknown  . Spironolactone     Hair loss    Patient Active Problem List   Diagnosis Date Noted  . Myofacial muscle pain 10/16/2011  . Back pain, Left Flank 07/01/2011  . Supratherapeutic INR 06/20/2011  . Melena 06/20/2011  . Acute blood loss anemia 06/20/2011  . Cerebral embolism with cerebral infarction 06/12/2011  . CVA (cerebral infarction) 05/20/2011  . GI bleed  05/19/2011  . Weakness of left side of body 05/16/2011  . Elevated digoxin level 05/16/2011  . Pacemaker 05/21/2010  . Edema 04/24/2010  . Pericardial effusion, Large   . Chronic anticoagulation   . HTN (hypertension)   . Atrial fibrillation 04/14/2010    Review of Systems  Constitutional: Negative for fever and chills.  Respiratory: Negative for cough and shortness of breath.   Cardiovascular: Negative for chest pain and leg swelling.  Gastrointestinal: Positive for constipation and anal bleeding. Negative for nausea, vomiting, abdominal pain and diarrhea.  Musculoskeletal: Negative for back pain and myalgias.  Skin: Negative for color change and rash.  Neurological: Negative for headaches.  Psychiatric/Behavioral: Negative for behavioral problems and confusion.      Objective:   Physical Exam  Nursing note and vitals reviewed. Constitutional: She is oriented to person, place, and time. She appears well-developed and well-nourished. No distress.  HENT:  Head: Normocephalic and atraumatic.  Right Ear: Tympanic membrane, external ear and ear canal normal.  Left Ear: Tympanic membrane, external ear and ear canal normal.  Nose: Nose normal. No mucosal edema or rhinorrhea.  Mouth/Throat: Uvula is midline, oropharynx is clear and moist and mucous membranes are normal. No oropharyngeal exudate, posterior oropharyngeal edema or posterior oropharyngeal erythema.  Eyes: EOM are normal.  Neck: Neck supple. No tracheal deviation present.  Cardiovascular: Normal rate.  An irregular rhythm present. Exam reveals no gallop and no friction rub.   Murmur heard.  Systolic murmur is present with a grade of 2/6  Pulmonary/Chest: Effort normal and breath sounds normal. No respiratory distress. She has no wheezes. She has no rales.  Abdominal: Soft. She exhibits no distension. There is no rebound and no guarding.  Musculoskeletal: Normal range of motion.  Neurological: She is alert and oriented to  person, place, and time.  Slight tremor of lips.  Skin: Skin is warm and dry. No rash noted.  Psychiatric: She has a normal mood and affect. Her behavior is normal.   Filed Vitals:   03/28/13 1104  BP: 131/75  Pulse: 87  Temp: 97.9 F (36.6 C)  TempSrc: Oral  Resp: 16  SpO2: 95%      Assessment & Plan:  Labs were done today to include a vitamin D comprehensive metabolic panel and CBC to check on anemia. She is referred back to Dr. Gorden Harms for evaluation of her chronic right upper abdominal discomfort. She also has persistent rectal bleeding which is probably secondary to trauma related to the digital extraction of impacted stool. I encouraged her to take her MiraLax on a regular basis.  I personally performed the services described in this documentation, which was scribed in my presence. The recorded information has been  reviewed and is accurate. **Disclaimer: This note was dictated with voice recognition software. Similar sounding words can inadvertently be transcribed and this note may contain transcription errors which may not have been corrected upon publication of note.**

## 2013-03-29 ENCOUNTER — Encounter: Payer: Self-pay | Admitting: *Deleted

## 2013-03-29 LAB — COMPLETE METABOLIC PANEL WITH GFR
ALK PHOS: 71 U/L (ref 39–117)
ALT: 22 U/L (ref 0–35)
AST: 22 U/L (ref 0–37)
Albumin: 4.1 g/dL (ref 3.5–5.2)
BILIRUBIN TOTAL: 0.7 mg/dL (ref 0.2–1.2)
BUN: 16 mg/dL (ref 6–23)
CO2: 33 mEq/L — ABNORMAL HIGH (ref 19–32)
Calcium: 9.6 mg/dL (ref 8.4–10.5)
Chloride: 102 mEq/L (ref 96–112)
Creat: 0.95 mg/dL (ref 0.50–1.10)
GFR, EST AFRICAN AMERICAN: 62 mL/min
GFR, EST NON AFRICAN AMERICAN: 54 mL/min — AB
GLUCOSE: 99 mg/dL (ref 70–99)
Potassium: 4.1 mEq/L (ref 3.5–5.3)
SODIUM: 142 meq/L (ref 135–145)
TOTAL PROTEIN: 7.1 g/dL (ref 6.0–8.3)

## 2013-03-29 LAB — VITAMIN D 25 HYDROXY (VIT D DEFICIENCY, FRACTURES): Vit D, 25-Hydroxy: 46 ng/mL (ref 30–89)

## 2013-03-30 ENCOUNTER — Ambulatory Visit (INDEPENDENT_AMBULATORY_CARE_PROVIDER_SITE_OTHER): Payer: Medicare Other | Admitting: Cardiovascular Disease

## 2013-03-30 ENCOUNTER — Encounter: Payer: Self-pay | Admitting: Cardiovascular Disease

## 2013-03-30 VITALS — BP 120/80 | HR 91 | Resp 16 | Ht 65.0 in | Wt 162.5 lb

## 2013-03-30 DIAGNOSIS — I313 Pericardial effusion (noninflammatory): Secondary | ICD-10-CM

## 2013-03-30 DIAGNOSIS — I6350 Cerebral infarction due to unspecified occlusion or stenosis of unspecified cerebral artery: Secondary | ICD-10-CM

## 2013-03-30 DIAGNOSIS — I3139 Other pericardial effusion (noninflammatory): Secondary | ICD-10-CM

## 2013-03-30 DIAGNOSIS — I319 Disease of pericardium, unspecified: Secondary | ICD-10-CM

## 2013-03-30 DIAGNOSIS — Z950 Presence of cardiac pacemaker: Secondary | ICD-10-CM

## 2013-03-30 DIAGNOSIS — I4891 Unspecified atrial fibrillation: Secondary | ICD-10-CM

## 2013-03-30 LAB — MDC_IDC_ENUM_SESS_TYPE_INCLINIC
Battery Impedance: 2900 Ohm
Battery Voltage: 2.69 V
Lead Channel Pacing Threshold Amplitude: 0.875 V
Lead Channel Pacing Threshold Pulse Width: 0.4 ms
Lead Channel Sensing Intrinsic Amplitude: 1.4 mV
Lead Channel Setting Pacing Amplitude: 2.5 V
MDC IDC MSMT BATTERY REMAINING LONGEVITY: 18 mo
MDC IDC MSMT LEADCHNL RV IMPEDANCE VALUE: 456 Ohm
MDC IDC SET LEADCHNL RV PACING PULSEWIDTH: 0.4 ms
MDC IDC SET LEADCHNL RV SENSING SENSITIVITY: 2 mV
MDC IDC STAT BRADY RV PERCENT PACED: 52.6 %

## 2013-03-30 MED ORDER — TORSEMIDE 20 MG PO TABS: 30.0000 mg | ORAL_TABLET | Freq: Every day | ORAL | Status: DC

## 2013-03-30 NOTE — Assessment & Plan Note (Signed)
Clinically there are no signs of tamponade. I do worry about her episodes that might represent orthopnea, but wonder whether this is an expression of diastolic left ventricular dysfunction rather than the pericardial effusion. Stopping her anticoagulations to allow a pericardiocentesis will be associated with a nontrivial risk of embolic events. Consider performing a pericardiocentesis for diagnostic purposes, at the time of her pacemaker system revision. This will allow for fewer interruptions anticoagulation. In the meantime, I've asked her to increase her torsemide to 30 mg once daily.

## 2013-03-30 NOTE — Progress Notes (Signed)
Patient ID: [name], female   DOB: 06/30/1923, 80 y.o.   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 She has permanent atrial fibrillation and is on warfarin anticoagulation. She had a presumed embolic stroke in May of 2013 when her anticoagulation was slightly subtherapeutic at 1.7. She has also had a history of gastrointestinal bleeding from arteriovenous malformations when super therapeutically anticoagulated. She is currently on warfarin without any recent bleeding events.  A pericardial effusion was initially identified in 2012 and described as trivial, it was small to moderate on the echocardiogram performed in 07/02/2011 and probably unchanged but described as moderate on the echocardiogram performed 07/28/2011, moderate to large in August 2014. It is echo free and free flowing and again there are no signs of tamponade. It is noteworthy that a CT of the chest performed last year described a more generalized syndrome of fluid overload with pleural effusions and small amount of ascites.  Has not had fever, chills, weight loss, cough, pleuritic or anginal chest pain, active bleeding and there is no worsening of her chronic New York Heart Association functional class II shortness of breath on exertion. She remains active taking care of her entire household and her lawn without assistance. She rarely has ankle swelling. Occasionally she notes it is uncomfortable to lie flat in bed because of difficult breathing. This is inconsistent and sporadic.    Allergies  Allergen Reactions  . Iodinated Diagnostic Agents Hives    HIVES 15MIN S/P IV CONTRAST INJECTION,WILL NEED 13 HR PREP FOR FUTURE INJECTIONS, ok s/p 50mg  po benadryl//a.calhoun  . Anti-Inflammatory Enzyme [Nutritional Supplements]     Retains fluids and headaches  . Arthrotec [Diclofenac-Misoprostol] Other (See Comments)      unknown  . Biaxin [Clarithromycin] Other (See Comments)    unknown  . Plavix [Clopidogrel Bisulfate] Other (See Comments)    unknown  . Spironolactone     Hair loss    Current Outpatient Prescriptions  Medication Sig Dispense Refill  . Cholecalciferol (VITAMIN D-3) 1000 UNITS CAPS Take 2,000 Units by mouth daily.      . codeine 15 MG tablet Take 1 tablet (15 mg total) by mouth every 8 (eight) hours as needed for pain.  60 tablet  0  . digoxin (LANOXIN) 0.125 MG tablet Take 1 tablet (0.125 mg total) by mouth daily.  30 tablet  5  . hydrocortisone (ANUSOL-HC) 25 MG suppository Place 1 suppository (25 mg total) rectally 2 (two) times daily.  24 suppository  4  . hydrocortisone (PROCTOSOL HC) 2.5 % rectal cream Place 1 application rectally 2 (two) times daily.  30 g  5  . methocarbamol (ROBAXIN) 500 MG tablet as needed. One  q 8 hrs prn spasm      . multivitamin (THERAGRAN) per tablet Take 1 tablet by mouth daily.        . polyethylene glycol powder (GLYCOLAX/MIRALAX) powder Take 17 g by mouth Once daily as needed.      . potassium chloride (K-DUR,KLOR-CON) 10 MEQ tablet Take 10 mEq by mouth daily. When you take Torsemide.      . torsemide (DEMADEX) 20 MG tablet Take 1.5 tablets (30 mg total) by mouth daily.  45 tablet  6  . Triamcinolone Acetonide (TRIAMCINOLONE 0.1 % CREAM : EUCERIN) CREA Apply 1 application topically 2 (two) times daily as needed.  1 each  5  .  verapamil (CALAN-SR) 180 MG CR tablet Take 1 tablet (180 mg total) by mouth at bedtime.  30 tablet  8  . warfarin (COUMADIN) 5 MG tablet Take one tablet daily or as directed per INR.  90 tablet  3   No current facility-administered medications for this visit.    Past Medical History  Diagnosis Date  . Pericardial effusion     Chronic. Last echo in April of 2012.   . Atrial fibrillation     Chronic  . Chronic anticoagulation     on Coumadin  . History of GI bleed     once treated with Fe infusion  . HTN (hypertension)    . Sleep apnea   . Pacemaker     placed for tachybrady.   . Valvular heart disease   . Obesity   . LVH (left ventricular hypertrophy)   . Stroke April 2013    right frontal  . Thrombophlebitis 1967    associated with pregnancy  . Diverticulosis   . Heart murmur   . Anemia   . Blood transfusion without reported diagnosis   . Osteoporosis   . Cataract   . Hx of echocardiogram 07/28/2011    EF>55% small to moderate and is mostly located posteriorly. there is no overt evidence of tamponade although subtle diastolic collapse of the right ventricle.    Past Surgical History  Procedure Laterality Date  . Pacemaker insertion  2001    Last generator in 2006; interrogated Dec 2012  . Breast lumpectomy      bilaterally; negative for cancer  . Tonsillectomy  1950  . Adenoidectomy    . Esophagogastroduodenoscopy  05/19/2011    Procedure: ESOPHAGOGASTRODUODENOSCOPY (EGD);  Surgeon: Lear Ng, MD;  Location: [facility] ENDOSCOPY;  Service: Endoscopy;  Laterality: N/A;  doctor aware of inr   will try to be here no later than 230  . Cholecystectomy  2005  . Esophagogastroduodenoscopy  06/22/2011    Procedure: ESOPHAGOGASTRODUODENOSCOPY (EGD);  Surgeon: Arta Silence, MD;  Location: [facility] ENDOSCOPY;  Service: Endoscopy;  Laterality: N/A;  Check PT/INR in am  . Givens capsule study  06/23/2011    Procedure: GIVENS CAPSULE STUDY;  Surgeon: Arta Silence, MD;  Location: [facility] ENDOSCOPY;  Service: Endoscopy;  Laterality: N/A;  . Breast lumpectomy      left breast  . Colonoscopy  08/11/2011    Procedure: COLONOSCOPY;  Surgeon: Jeryl Columbia, MD;  Location: [facility] ENDOSCOPY;  Service: Endoscopy;  Laterality: N/A;  . Hot hemostasis  08/11/2011    Procedure: HOT HEMOSTASIS (ARGON PLASMA COAGULATION/BICAP);  Surgeon: Jeryl Columbia, MD;  Location: [facility] ENDOSCOPY;  Service: Endoscopy;  Laterality: N/A;    Family History  Problem Relation Age of Onset  . Stroke Mother   . Stroke Father   . Pneumonia Father      History   Social History  . Marital Status: Widowed    Spouse Name: N/A    Number of Children: N/A  . Years of Education: N/A   Occupational History  . Not on file.   Social History Main Topics  . Smoking status: Never Smoker   . Smokeless tobacco: Never Used  . Alcohol Use: No  . Drug Use: No  . Sexual Activity: No   Other Topics Concern  . Not on file   Social History Narrative  . No narrative on file    Review of systems: The patient specifically denies any chest pain at rest or with exertion, dyspnea at rest,  orthopnea, paroxysmal nocturnal dyspnea, syncope, palpitations, focal neurological deficits, intermittent claudication, lower extremity edema, unexplained weight gain, cough, hemoptysis or wheezing.  The patient also denies abdominal pain, nausea, vomiting, dysphagia, diarrhea, constipation, polyuria, polydipsia, dysuria, hematuria, frequency, urgency, abnormal bleeding or bruising, fever, chills, unexpected weight changes, mood swings, change in skin or hair texture, change in voice quality, auditory or visual problems, allergic reactions or rashes, new musculoskeletal complaints other than usual "aches and pains".   PHYSICAL EXAM BP 120/80  Pulse 91  Resp 16  Ht 5\' 5"  (1.651 m)  Wt 73.71 kg (162 lb 8 oz)  BMI 27.04 kg/m2 General: Alert, oriented x3, no distress  Head: no evidence of trauma, PERRL, EOMI, no exophtalmos or lid lag, no myxedema, no xanthelasma; normal ears, nose and oropharynx  Neck: normal jugular venous pulsations and no hepatojugular reflux; brisk carotid pulses without delay and no carotid bruits  Chest: clear to auscultation, no signs of consolidation by percussion or palpation, normal fremitus, symmetrical and full respiratory excursions  Cardiovascular: normal position and quality of the apical impulse, regular rhythm, normal first and paradoxically split second heart sounds, no murmurs, rubs or gallops  Abdomen: no tenderness or  distention, no masses by palpation, no abnormal pulsatility or arterial bruits, normal bowel sounds, no hepatosplenomegaly  Extremities: no clubbing, cyanosis or edema; 2+ radial, ulnar and brachial pulses bilaterally; 2+ right femoral, posterior tibial and dorsalis pedis pulses; 2+ left femoral, posterior tibial and dorsalis pedis pulses; no subclavian or femoral bruits  Neurological: There is very mild left facial droop and slightly weaker grip on the left side   EKG: EKG: Background atrial fibrillation, 100% ventricular pacing   Lipid Panel     Component Value Date/Time   CHOL 141 05/17/2011 0920   TRIG 107 05/17/2011 0920   HDL 30* 05/17/2011 0920   CHOLHDL 4.7 05/17/2011 0920   VLDL 21 05/17/2011 0920   LDLCALC 90 05/17/2011 0920    BMET    Component Value Date/Time   NA 142 03/28/2013 1141   K 4.1 03/28/2013 1141   CL 102 03/28/2013 1141   CO2 33* 03/28/2013 1141   GLUCOSE 99 03/28/2013 1141   BUN 16 03/28/2013 1141   CREATININE 0.95 03/28/2013 1141   CREATININE 1.10 07/02/2011 0509   CALCIUM 9.6 03/28/2013 1141   GFRNONAA 44* 07/02/2011 0509   GFRAA 51* 07/02/2011 0509     ASSESSMENT AND PLAN Atrial fibrillation I wonder whether stopping the verapamil would allow for less ventricular pacing. However she tells me that when she missed a few doses she had very unpleasant palpitations and does not think she can come off this medication. She should remain on full dose anticoagulations barring serious bleeding complications, since she has had a stroke when subtherapeutic with anticoagulated in the past.  Pacemaker Medtronic Adapta implanted 2006. Dual chamber device now programmed VVIR for permanent atrial fibrillation Chronic poor sensing on the ventricular channel with R waves of approximately 2 mV. Interrogation of her pacemaker today shows similar findings. Battery voltage is 2.69 V with an estimated longevity of about 18 months. There is roughly 50% ventricular pacing. Sensed R waves are 1.4-2.0  mV. Ventricular pacing threshold is 0.5 V at 0.4 ms pulse width. Lead impedance is steady at 456 ohms. When the time comes for generator change out she should receive a new ventricular lead.  Pericardial effusion, Large Clinically there are no signs of tamponade. I do worry about her episodes that might represent orthopnea, but wonder whether  this is an expression of diastolic left ventricular dysfunction rather than the pericardial effusion. Stopping her anticoagulations to allow a pericardiocentesis will be associated with a nontrivial risk of embolic events. Consider performing a pericardiocentesis for diagnostic purposes, at the time of her pacemaker system revision. This will allow for fewer interruptions anticoagulation. In the meantime, I've asked her to increase her torsemide to 30 mg once daily.   Orders Placed This Encounter  Procedures  . EKG 12-Lead   Meds ordered this encounter  Medications  . Cholecalciferol (VITAMIN D-3) 1000 UNITS CAPS    Sig: Take 2,000 Units by mouth daily.  . methocarbamol (ROBAXIN) 500 MG tablet    Sig: as needed. One  q 8 hrs prn spasm  . potassium chloride (K-DUR,KLOR-CON) 10 MEQ tablet    Sig: Take 10 mEq by mouth daily. When you take Torsemide.  Marland Kitchen DISCONTD: torsemide (DEMADEX) 20 MG tablet    Sig: Take 30 mg by mouth daily.  Marland Kitchen torsemide (DEMADEX) 20 MG tablet    Sig: Take 1.5 tablets (30 mg total) by mouth daily.    Dispense:  45 tablet    Refill:  Calhoun Damarcus Reggio, MD, [facility] [phone] office [phone] pager

## 2013-03-30 NOTE — Patient Instructions (Signed)
Your physician recommends that you schedule a follow-up appointment in: 6 Months  Remote monitoring is used to monitor your Pacemaker of ICD from home. This monitoring reduces the number of office visits required to check your device to one time per year. It allows [place] to keep an eye on the functioning of your device to ensure it is working properly. You are scheduled for a device check from home in June. You may send your transmission at any time that day. If you have a wireless device, the transmission will be sent automatically. After your physician reviews your transmission, you will receive a postcard with your next transmission date.  Your physician has recommended you make the following change in your medication: Take 1 1/2 Torsemide daily

## 2013-03-30 NOTE — Assessment & Plan Note (Signed)
Medtronic Adapta implanted 2006. Dual chamber device now programmed VVIR for permanent atrial fibrillation Chronic poor sensing on the ventricular channel with R waves of approximately 2 mV. Interrogation of her pacemaker today shows similar findings. Battery voltage is 2.69 V with an estimated longevity of about 18 months. There is roughly 50% ventricular pacing. Sensed R waves are 1.4-2.0 mV. Ventricular pacing threshold is 0.5 V at 0.4 ms pulse width. Lead impedance is steady at 456 ohms. When the time comes for generator change out she should receive a new ventricular lead.

## 2013-03-30 NOTE — Assessment & Plan Note (Signed)
I wonder whether stopping the verapamil would allow for less ventricular pacing. However she tells me that when she missed a few doses she had very unpleasant palpitations and does not think she can come off this medication. She should remain on full dose anticoagulations barring serious bleeding complications, since she has had a stroke when subtherapeutic with anticoagulated in the past.

## 2013-04-04 LAB — POCT INR: INR: 2.1

## 2013-04-05 ENCOUNTER — Ambulatory Visit (INDEPENDENT_AMBULATORY_CARE_PROVIDER_SITE_OTHER): Payer: Medicare Other | Admitting: Pharmacist Clinician (PhC)/ Clinical Pharmacy Specialist

## 2013-04-05 DIAGNOSIS — I639 Cerebral infarction, unspecified: Secondary | ICD-10-CM

## 2013-04-05 DIAGNOSIS — I6350 Cerebral infarction due to unspecified occlusion or stenosis of unspecified cerebral artery: Secondary | ICD-10-CM

## 2013-04-05 DIAGNOSIS — I4891 Unspecified atrial fibrillation: Secondary | ICD-10-CM

## 2013-04-05 DIAGNOSIS — Z7901 Long term (current) use of anticoagulants: Secondary | ICD-10-CM

## 2013-04-18 LAB — POCT INR: INR: 2.3

## 2013-04-19 ENCOUNTER — Ambulatory Visit (INDEPENDENT_AMBULATORY_CARE_PROVIDER_SITE_OTHER): Payer: Medicare Other | Admitting: Pharmacist Clinician (PhC)/ Clinical Pharmacy Specialist

## 2013-04-19 DIAGNOSIS — Z7901 Long term (current) use of anticoagulants: Secondary | ICD-10-CM

## 2013-04-19 DIAGNOSIS — I6350 Cerebral infarction due to unspecified occlusion or stenosis of unspecified cerebral artery: Secondary | ICD-10-CM

## 2013-04-19 DIAGNOSIS — I4891 Unspecified atrial fibrillation: Secondary | ICD-10-CM

## 2013-04-19 DIAGNOSIS — I639 Cerebral infarction, unspecified: Secondary | ICD-10-CM

## 2013-05-02 LAB — POCT INR: INR: 1.5

## 2013-05-03 ENCOUNTER — Ambulatory Visit (INDEPENDENT_AMBULATORY_CARE_PROVIDER_SITE_OTHER): Payer: Medicare Other | Admitting: Pharmacist Clinician (PhC)/ Clinical Pharmacy Specialist

## 2013-05-03 DIAGNOSIS — Z7901 Long term (current) use of anticoagulants: Secondary | ICD-10-CM

## 2013-05-03 DIAGNOSIS — I4891 Unspecified atrial fibrillation: Secondary | ICD-10-CM

## 2013-05-03 DIAGNOSIS — I6350 Cerebral infarction due to unspecified occlusion or stenosis of unspecified cerebral artery: Secondary | ICD-10-CM

## 2013-05-03 DIAGNOSIS — I639 Cerebral infarction, unspecified: Secondary | ICD-10-CM

## 2013-05-15 LAB — POCT INR: INR: 2

## 2013-05-16 ENCOUNTER — Ambulatory Visit (INDEPENDENT_AMBULATORY_CARE_PROVIDER_SITE_OTHER): Payer: Medicare Other | Admitting: Pharmacist Clinician (PhC)/ Clinical Pharmacy Specialist

## 2013-05-16 DIAGNOSIS — I4891 Unspecified atrial fibrillation: Secondary | ICD-10-CM

## 2013-05-16 DIAGNOSIS — I6350 Cerebral infarction due to unspecified occlusion or stenosis of unspecified cerebral artery: Secondary | ICD-10-CM

## 2013-05-16 DIAGNOSIS — I639 Cerebral infarction, unspecified: Secondary | ICD-10-CM

## 2013-05-16 DIAGNOSIS — Z7901 Long term (current) use of anticoagulants: Secondary | ICD-10-CM

## 2013-05-17 ENCOUNTER — Other Ambulatory Visit: Payer: Self-pay

## 2013-05-30 LAB — POCT INR: INR: 2.1

## 2013-05-31 ENCOUNTER — Ambulatory Visit (INDEPENDENT_AMBULATORY_CARE_PROVIDER_SITE_OTHER): Payer: Medicare Other | Admitting: Pharmacist Clinician (PhC)/ Clinical Pharmacy Specialist

## 2013-05-31 DIAGNOSIS — I6350 Cerebral infarction due to unspecified occlusion or stenosis of unspecified cerebral artery: Secondary | ICD-10-CM

## 2013-05-31 DIAGNOSIS — Z7901 Long term (current) use of anticoagulants: Secondary | ICD-10-CM

## 2013-05-31 DIAGNOSIS — I639 Cerebral infarction, unspecified: Secondary | ICD-10-CM

## 2013-05-31 DIAGNOSIS — I4891 Unspecified atrial fibrillation: Secondary | ICD-10-CM

## 2013-06-07 ENCOUNTER — Other Ambulatory Visit: Payer: Self-pay | Admitting: Emergency Medicine

## 2013-06-07 DIAGNOSIS — Z1231 Encounter for screening mammogram for malignant neoplasm of breast: Secondary | ICD-10-CM

## 2013-06-08 ENCOUNTER — Telehealth: Payer: Self-pay

## 2013-06-08 NOTE — Telephone Encounter (Signed)
Patient called and wanted to know when her last tetanus shot was given. I searched through paper chart and [facility] system and advised patient that it was done 05/27/02.

## 2013-06-13 ENCOUNTER — Ambulatory Visit (INDEPENDENT_AMBULATORY_CARE_PROVIDER_SITE_OTHER): Payer: Medicare Other | Admitting: Pharmacist Clinician (PhC)/ Clinical Pharmacy Specialist

## 2013-06-13 DIAGNOSIS — I6350 Cerebral infarction due to unspecified occlusion or stenosis of unspecified cerebral artery: Secondary | ICD-10-CM

## 2013-06-13 DIAGNOSIS — Z7901 Long term (current) use of anticoagulants: Secondary | ICD-10-CM

## 2013-06-13 DIAGNOSIS — I639 Cerebral infarction, unspecified: Secondary | ICD-10-CM

## 2013-06-13 DIAGNOSIS — I4891 Unspecified atrial fibrillation: Secondary | ICD-10-CM

## 2013-06-13 LAB — POCT INR: INR: 2

## 2013-06-15 ENCOUNTER — Ambulatory Visit (HOSPITAL_COMMUNITY)
Admission: RE | Admit: 2013-06-15 | Discharge: 2013-06-15 | Disposition: A | Discharge status: Home / Self Care | Payer: Medicare Other | Source: Ambulatory Visit | Attending: Emergency Medicine | Admitting: Emergency Medicine

## 2013-06-15 DIAGNOSIS — Z1231 Encounter for screening mammogram for malignant neoplasm of breast: Secondary | ICD-10-CM | POA: Insufficient documentation

## 2013-06-16 ENCOUNTER — Other Ambulatory Visit: Payer: Self-pay | Admitting: Emergency Medicine

## 2013-06-19 ENCOUNTER — Telehealth: Payer: Self-pay

## 2013-06-19 NOTE — Telephone Encounter (Signed)
duplicate

## 2013-06-19 NOTE — Telephone Encounter (Signed)
Pharm note says pt is requesting a year's supply. Last seen in March. Dr Everlene Farrier, please advise how many RFs?

## 2013-06-27 LAB — POCT INR: INR: 2

## 2013-06-28 ENCOUNTER — Ambulatory Visit (INDEPENDENT_AMBULATORY_CARE_PROVIDER_SITE_OTHER): Payer: Medicare Other | Admitting: Pharmacist Clinician (PhC)/ Clinical Pharmacy Specialist

## 2013-06-28 DIAGNOSIS — I4891 Unspecified atrial fibrillation: Secondary | ICD-10-CM

## 2013-06-28 DIAGNOSIS — I6350 Cerebral infarction due to unspecified occlusion or stenosis of unspecified cerebral artery: Secondary | ICD-10-CM

## 2013-06-28 DIAGNOSIS — I639 Cerebral infarction, unspecified: Secondary | ICD-10-CM

## 2013-06-28 DIAGNOSIS — Z7901 Long term (current) use of anticoagulants: Secondary | ICD-10-CM

## 2013-07-05 ENCOUNTER — Other Ambulatory Visit: Payer: Self-pay

## 2013-07-13 ENCOUNTER — Ambulatory Visit (INDEPENDENT_AMBULATORY_CARE_PROVIDER_SITE_OTHER): Payer: Medicare Other | Admitting: Pharmacist Clinician (PhC)/ Clinical Pharmacy Specialist

## 2013-07-13 DIAGNOSIS — I6350 Cerebral infarction due to unspecified occlusion or stenosis of unspecified cerebral artery: Secondary | ICD-10-CM

## 2013-07-13 DIAGNOSIS — I4891 Unspecified atrial fibrillation: Secondary | ICD-10-CM

## 2013-07-13 DIAGNOSIS — Z7901 Long term (current) use of anticoagulants: Secondary | ICD-10-CM

## 2013-07-13 DIAGNOSIS — I639 Cerebral infarction, unspecified: Secondary | ICD-10-CM

## 2013-07-13 LAB — POCT INR: INR: 2.4

## 2013-07-25 LAB — PROTIME-INR: INR: 3.1 — AB (ref 0.9–1.1)

## 2013-07-26 ENCOUNTER — Ambulatory Visit (INDEPENDENT_AMBULATORY_CARE_PROVIDER_SITE_OTHER): Payer: Medicare Other | Admitting: Pharmacist Clinician (PhC)/ Clinical Pharmacy Specialist

## 2013-07-26 DIAGNOSIS — Z7901 Long term (current) use of anticoagulants: Secondary | ICD-10-CM

## 2013-07-26 HISTORY — PX: LIPOMA EXCISION: SHX5283

## 2013-08-01 LAB — POCT INR: INR: 2.6

## 2013-08-03 ENCOUNTER — Encounter: Payer: Self-pay | Admitting: Emergency Medicine

## 2013-08-03 ENCOUNTER — Ambulatory Visit (INDEPENDENT_AMBULATORY_CARE_PROVIDER_SITE_OTHER): Payer: Medicare Other | Admitting: Emergency Medicine

## 2013-08-03 VITALS — BP 122/75 | HR 92 | Temp 99.1°F | Resp 16 | Ht 65.0 in | Wt 162.8 lb

## 2013-08-03 DIAGNOSIS — R81 Glycosuria: Secondary | ICD-10-CM

## 2013-08-03 DIAGNOSIS — R739 Hyperglycemia, unspecified: Secondary | ICD-10-CM

## 2013-08-03 DIAGNOSIS — I10 Essential (primary) hypertension: Secondary | ICD-10-CM

## 2013-08-03 DIAGNOSIS — E119 Type 2 diabetes mellitus without complications: Secondary | ICD-10-CM | POA: Insufficient documentation

## 2013-08-03 DIAGNOSIS — I4891 Unspecified atrial fibrillation: Secondary | ICD-10-CM

## 2013-08-03 DIAGNOSIS — Z13220 Encounter for screening for lipoid disorders: Secondary | ICD-10-CM

## 2013-08-03 DIAGNOSIS — E0865 Diabetes mellitus due to underlying condition with hyperglycemia: Secondary | ICD-10-CM

## 2013-08-03 DIAGNOSIS — E1369 Other specified diabetes mellitus with other specified complication: Secondary | ICD-10-CM

## 2013-08-03 DIAGNOSIS — Z23 Encounter for immunization: Secondary | ICD-10-CM

## 2013-08-03 DIAGNOSIS — I6350 Cerebral infarction due to unspecified occlusion or stenosis of unspecified cerebral artery: Secondary | ICD-10-CM

## 2013-08-03 DIAGNOSIS — R7309 Other abnormal glucose: Secondary | ICD-10-CM

## 2013-08-03 DIAGNOSIS — I319 Disease of pericardium, unspecified: Secondary | ICD-10-CM

## 2013-08-03 LAB — POCT URINALYSIS DIPSTICK
BILIRUBIN UA: NEGATIVE
GLUCOSE UA: NEGATIVE
Ketones, UA: NEGATIVE
LEUKOCYTES UA: NEGATIVE
Nitrite, UA: NEGATIVE
SPEC GRAV UA: 1.015
Urobilinogen, UA: 0.2
pH, UA: 6.5

## 2013-08-03 LAB — GLUCOSE, POCT (MANUAL RESULT ENTRY): POC Glucose: 109 mg/dl — AB (ref 70–99)

## 2013-08-03 LAB — POCT GLYCOSYLATED HEMOGLOBIN (HGB A1C): Hemoglobin A1C: 6.3

## 2013-08-03 NOTE — Progress Notes (Addendum)
   Subjective:    Patient ID: [name], female    DOB: [birth year], 80 y.o.   MRN: [id number]  This chart was scribed for Darlyne Russian, MD by Cathie Hoops, ED Scribe. The patient's care was started at 11:15 AM.   Chief Complaint  Patient presents with  . Follow-up    hypertension and cva  . sugar in urine    was told by another physician that she had sugar in her urine   HPI HPI Comments: [name] is a 80 y.o. female who presents to the [facility] complaining of sugar in her urine.  Pt is also here for a follow-up for HTN and CVA.   Pt states she has fluid around her heart and her cardiologist is unsure of why it is persistent. Pt states she last saw her cardiologist 6 months ago and will schedule an appointment.   Pt reports she has the pneumonia vaccine without reaction.   Past Medical History  Diagnosis Date  . Pericardial effusion     Chronic. Last echo in April of 2012.   . Atrial fibrillation     Chronic  . Chronic anticoagulation     on Coumadin  . History of GI bleed     once treated with Fe infusion  . HTN (hypertension)   . Sleep apnea   . Pacemaker     placed for tachybrady.   . Valvular heart disease   . Obesity   . LVH (left ventricular hypertrophy)   . Stroke April 2013    right frontal  . Thrombophlebitis 1967    associated with pregnancy  . Diverticulosis   . Heart murmur   . Anemia   . Blood transfusion without reported diagnosis   . Osteoporosis   . Cataract   . Hx of echocardiogram 07/28/2011    EF>55% small to moderate and is mostly located posteriorly. there is no overt evidence of tamponade although subtle diastolic collapse of the right ventricle.   Filed Vitals:   08/03/13 1059  BP: 122/75  Pulse: 92  Temp: 99.1 F (37.3 C)  Resp: 16     Review of Systems Constitutional: Negative for fever and chills. Gastrointestinal: Negative for nausea and vomiting.     Objective:   Physical  Exam CONSTITUTIONAL: Well developed/well nourished HEAD: Normocephalic/atraumatic EYES: EOMI/PERRL ENMT: Mucous membranes moist NECK: supple no meningeal signs SPINE:entire spine nontender CVheart rhythm is very irregular. LUNGS: Lungs are clear to auscultation bilaterally, no apparent distress ABDOMEN: soft, nontender, no rebound or guarding GU:no cva tenderness NEURO: Pt is awake/alert, moves all extremitiesx4 EXTREMITIES: pulses normal, full ROM SKIN: warm, color normal PSYCH: no abnormalities of mood noted     Assessment & Plan:  Patient looks excellent today. Routine labs were drawn to include an ACE level and lipid panel. Her A1c is 6.3 not high enough to put her on medication yet but I did talk to her about diet and apparently she likes Key lime pie. She has been stable regarding her pericarditis. She has an appointment to see her cardiologist in the near future. She asked me if I would do an ACE level today which I did. She has a followup appointment with Dr. Einar Gip.

## 2013-08-03 NOTE — Patient Instructions (Signed)
Pneumococcal Conjugate Vaccine  What You Need to Know  Your doctor recommends that you, or your child, get a dose of PCV13 vaccine today.  WHY GET VACCINATED?  Pneumococcal conjugate vaccine (called PCV13 or Prevnar 13) is recommended to protect infants and toddlers, and some older children and adults with certain health conditions, from pneumococcal disease.  Pneumococcal disease is caused by infection with Streptococcus pneumoniae bacteria. These bacteria can spread from person to person through close contact.  Pneumococcal disease can lead to severe health problems, including pneumonia, blood infections, and meningitis.  Meningitis is an infection of the covering of the brain. Pneumococcal meningitis is fairly rare (less than 1 case per 100,000 people each year), but it leads to other health problems, including deafness and brain damage. In children, it is fatal in about 1 case out of 10.  Children younger than two are at higher risk for serious disease than older children.  People with certain medical conditions, people over age 65, and cigarette smokers are also at higher risk.  Before vaccine, pneumococcal infections caused many problems each year in the [place] in children younger than 5, including:  · more than 700 cases of meningitis,  · 13,000 blood infections,  · about 5 million ear infections, and  · about 200 deaths.  About 4,000 adults still die each year because of pneumococcal infections.  Pneumococcal infections can be hard to treat because some strains are resistant to antibiotics. This makes prevention through vaccination even more important.  PCV13 VACCINE  There are more than 90 types of pneumococcal bacteria. PCV13 protects against 13 of them. These 13 strains cause most severe infections in children and about half of infections in adults.   PCV13 is routinely given to children at 2, 4, 6, and 12-15 months of age. Children in this age range are at greatest risk for serious diseases caused  by pneumococcal infection.  PCV13 vaccine may also be recommended for some older children or adults. Your doctor can give you details.  A second type of pneumococcal vaccine, called PPSV23, may also be given to some children and adults, including anyone over age 65. There is a separate Vaccine Information Statement for this vaccine.  PRECAUTIONS   Anyone who has ever had a life-threatening allergic reaction to a dose of this vaccine, to an earlier pneumococcal vaccine called PCV7 (or Prevnar), or to any vaccine containing diphtheria toxoid (for example, DTaP), should not get PCV13.  Anyone with a severe allergy to any component of PCV13 should not get the vaccine. Tell your doctor if the person being vaccinated has any severe allergies.  If the person scheduled for vaccination is sick, your doctor might decide to reschedule the shot on another day.  Your doctor can give you more information about any of these precautions.  RISKS   With any medicine, including vaccines, there is a chance of side effects. These are usually mild and go away on their own, but serious reactions are also possible.  Reported problems associated with PCV13 vary by dose and age, but generally:  · About half of children became drowsy after the shot, had a temporary loss of appetite, or had redness or tenderness where the shot was given.  · About 1 out of 3 had swelling where the shot was given.  · About 1 out of 3 had a mild fever, and about 1 in 20 had a higher fever (over 102.2° F or 39° C).  · Up to about 8   reported. Life-threatening allergic reactions from any vaccine are very rare. WHAT IF THERE IS A SERIOUS REACTION? What should I look for? Look for anything that concerns you, such as signs of a severe allergic reaction, very high  fever, or behavior changes. Signs of a severe allergic reaction can include hives, swelling of the face and throat, difficulty breathing, a fast heartbeat, dizziness, and weakness. These would start a few minutes to a few hours after the vaccination. What should I do?  If you think it is a severe allergic reaction or other emergency that can't wait, get the person to the nearest hospital or call 9-1-1. Otherwise, call your doctor.  Afterward, the reaction should be reported to the "Vaccine Adverse Event Reporting System" (VAERS). Your doctor might file this report, or you can do it yourself through the VAERS web site at www.vaers.SamedayNews.es, or by calling (713) 729-7609. VAERS is only for reporting reactions. They do not give medical advice. THE NATIONAL VACCINE INJURY COMPENSATION PROGRAM The National Vaccine Injury Compensation Program (VICP) was created in 1986. Persons who believe they may have been injured by a vaccine can learn about the program and about filing a claim by calling (858)316-0540 or visiting the [facility] website at GoldCloset.com.ee. HOW CAN I LEARN MORE?  Ask your doctor.  Call your local or state health department.  Contact the Centers for Disease Control and Prevention (CDC):  Call (754) 871-9498 (1-800-CDC-INFO) or  Visit CDC's website at http://hunter.com/ CDC PCV13 Vaccine VIS (Interim) (03/25/11) Document Released: 11/09/2005 Document Revised: 05/09/2012 Document Reviewed: 05/04/2012 [employer] Patient Information 2015 [place], [place]. This information is not intended to replace advice given to you by your health care provider. Make sure you discuss any questions you have with your health care provider. Tetanus, Diphtheria (Td) Vaccine What You Need to Know WHY GET VACCINATED? Tetanus  and diphtheria are very serious diseases. They are rare in the [place] today, but people who do become infected often have severe complications. Td vaccine is used  to protect adolescents and adults from both of these diseases. Both tetanus and diphtheria are infections caused by bacteria. Diphtheria spreads from person to person through coughing or sneezing. Tetanus-causing bacteria enter the body through cuts, scratches, or wounds. TETANUS (Lockjaw) causes painful muscle tightening and stiffness, usually all over the body.  It can lead to tightening of muscles in the head and neck so you can't open your mouth, swallow, or sometimes even breathe. Tetanus kills about 1 out of every 5 people who are infected. DIPHTHERIA can cause a thick coating to form in the back of the throat.  It can lead to breathing problems, paralysis, heart failure, and death. Before vaccines, the [place] saw as many as 200,000 cases a year of diphtheria and hundreds of cases of tetanus. Since vaccination began, cases of both diseases have dropped by about 99%. TD VACCINE Td vaccine can protect adolescents and adults from tetanus and diphtheria. Td is usually given as a booster dose every 10 years but it can also be given earlier after a severe and dirty wound or burn. Your doctor can give you more information. Td may safely be given at the same time as other vaccines. SOME PEOPLE SHOULD NOT GET THIS VACCINE  If you ever had a life-threatening allergic reaction after a dose of any tetanus or diphtheria containing vaccine, OR if you have a severe allergy to any part of this vaccine, you should not get Td. Tell your doctor if you have any severe allergies.  Talk to your doctor if you:  have epilepsy or another nervous system problem,  had severe pain or swelling after any vaccine containing diphtheria or tetanus,  ever had Guillain Barr Syndrome (GBS),  aren't feeling well on the day the shot is scheduled. RISKS OF A VACCINE REACTION With a vaccine, like any medicine, there is a chance of side effects. These are usually mild and go away on their own. Serious side effects  are also possible, but are very rare. Most people who get Td vaccine do not have any problems with it. Mild Problems  following Td (Did not interfere with activities)  Pain where the shot was given (about 8 people in 10)  Redness or swelling where the shot was given (about 1 person in 3)  Mild fever (about 1 person in 15)  Headache or Tiredness (uncommon) Moderate Problems following Td (Interfered with activities, but did not require medical attention)  Fever over 102 F (38.9 C) (rare) Severe Problems  following Td (Unable to perform usual activities; required medical attention)  Swelling, severe pain, bleeding, or redness in the arm where the shot was given (rare). Problems that could happen after any vaccine:  Brief fainting spells can happen after any medical procedure, including vaccination. Sitting or lying down for about 15 minutes can help prevent fainting, and injuries caused by a fall. Tell your doctor if you feel dizzy, or have vision changes or ringing in the ears.  Severe shoulder pain and reduced range of motion in the arm where a shot was given can happen, very rarely, after a vaccination.  Severe allergic reactions from a vaccine are very rare, estimated at less than 1 in a million doses. If one were to occur, it would usually be within a few minutes to a few hours after the vaccination. WHAT IF THERE IS A SERIOUS REACTION? What should I look for?  Look for anything that concerns you, such as signs of a severe allergic reaction, very high fever, or behavior changes. Signs of a severe allergic reaction can include hives, swelling of the face and throat, difficulty breathing, a fast heartbeat, dizziness, and weakness. These would usually start a few minutes to a few hours after the vaccination. What should I do?  If you think it is a severe allergic reaction or other emergency that can't wait, call 911 or get the person to the nearest hospital. Otherwise, call your  doctor.  Afterward, the reaction should be reported to the Vaccine Adverse Event Reporting System (VAERS). Your doctor might file this report, or, you can do it yourself through the VAERS website or by calling 308-675-3299. VAERS is only for reporting reactions. They do not give medical advice. THE NATIONAL VACCINE INJURY COMPENSATION PROGRAM The National Vaccine Injury Compensation Program (VICP) is a federal program that was created to compensate people who may have been injured by certain vaccines. Persons who believe they may have been injured by a vaccine can learn about the program and about filing a claim by calling (312)116-0143 or visiting the [facility] website. HOW CAN I LEARN MORE?  Ask your doctor.  Contact your local or state health department.  Contact the Centers for Disease Control and Prevention (CDC):  Call 984-716-0022 (1-800-CDC-INFO)  Visit CDC's vaccines website CDC Td Vaccine Interim VIS (03/01/12) Document Released: 11/09/2005 Document Revised: 05/09/2012 Document Reviewed: 05/04/2012 [facility] Patient Information 2015 [place], [place]. This information is not intended to replace advice given to you by your health care provider. Make sure you  discuss any questions you have with your health care provider.  

## 2013-08-04 LAB — CBC WITH DIFFERENTIAL/PLATELET
BASOS ABS: 0.1 10*3/uL (ref 0.0–0.1)
Basophils Relative: 1 % (ref 0–1)
EOS PCT: 5 % (ref 0–5)
Eosinophils Absolute: 0.3 10*3/uL (ref 0.0–0.7)
HEMATOCRIT: 37.3 % (ref 36.0–46.0)
HEMOGLOBIN: 13 g/dL (ref 12.0–15.0)
LYMPHS ABS: 1.2 10*3/uL (ref 0.7–4.0)
Lymphocytes Relative: 23 % (ref 12–46)
MCH: 29.5 pg (ref 26.0–34.0)
MCHC: 34.9 g/dL (ref 30.0–36.0)
MCV: 84.8 fL (ref 78.0–100.0)
MONO ABS: 0.5 10*3/uL (ref 0.1–1.0)
MONOS PCT: 9 % (ref 3–12)
Neutro Abs: 3.2 10*3/uL (ref 1.7–7.7)
Neutrophils Relative %: 62 % (ref 43–77)
Platelets: 259 10*3/uL (ref 150–400)
RBC: 4.4 MIL/uL (ref 3.87–5.11)
RDW: 13.2 % (ref 11.5–15.5)
WBC: 5.2 10*3/uL (ref 4.0–10.5)

## 2013-08-04 LAB — COMPLETE METABOLIC PANEL WITH GFR
ALBUMIN: 3.9 g/dL (ref 3.5–5.2)
ALT: 20 U/L (ref 0–35)
AST: 23 U/L (ref 0–37)
Alkaline Phosphatase: 63 U/L (ref 39–117)
BILIRUBIN TOTAL: 1 mg/dL (ref 0.2–1.2)
BUN: 17 mg/dL (ref 6–23)
CO2: 27 mEq/L (ref 19–32)
Calcium: 9 mg/dL (ref 8.4–10.5)
Chloride: 102 mEq/L (ref 96–112)
Creat: 1.05 mg/dL (ref 0.50–1.10)
GFR, EST NON AFRICAN AMERICAN: 48 mL/min — AB
GFR, Est African American: 55 mL/min — ABNORMAL LOW
GLUCOSE: 105 mg/dL — AB (ref 70–99)
POTASSIUM: 3.6 meq/L (ref 3.5–5.3)
Sodium: 139 mEq/L (ref 135–145)
TOTAL PROTEIN: 6.7 g/dL (ref 6.0–8.3)

## 2013-08-04 LAB — LIPID PANEL
CHOLESTEROL: 160 mg/dL (ref 0–200)
HDL: 37 mg/dL — AB (ref >39)
LDL CALC: 100 mg/dL — AB (ref 0–99)
TRIGLYCERIDES: 114 mg/dL (ref <150)
Total CHOL/HDL Ratio: 4.3 Ratio
VLDL: 23 mg/dL (ref 0–40)

## 2013-08-04 LAB — ANGIOTENSIN CONVERTING ENZYME: Angiotensin-Converting Enzyme: 47 U/L (ref 8–52)

## 2013-08-06 ENCOUNTER — Encounter: Payer: Self-pay | Admitting: Radiology

## 2013-08-08 ENCOUNTER — Ambulatory Visit: Payer: Medicare Other | Admitting: Emergency Medicine

## 2013-08-08 LAB — POCT INR: INR: 1.9

## 2013-08-09 ENCOUNTER — Ambulatory Visit (INDEPENDENT_AMBULATORY_CARE_PROVIDER_SITE_OTHER): Payer: Medicare Other | Admitting: Pharmacist

## 2013-08-09 DIAGNOSIS — Z7901 Long term (current) use of anticoagulants: Secondary | ICD-10-CM

## 2013-08-09 DIAGNOSIS — I4891 Unspecified atrial fibrillation: Secondary | ICD-10-CM

## 2013-08-22 ENCOUNTER — Encounter: Payer: Self-pay | Admitting: Cardiovascular Disease

## 2013-08-22 LAB — POCT INR: INR: 2.2

## 2013-08-23 ENCOUNTER — Ambulatory Visit (INDEPENDENT_AMBULATORY_CARE_PROVIDER_SITE_OTHER): Payer: Medicare Other | Admitting: Pharmacist Clinician (PhC)/ Clinical Pharmacy Specialist

## 2013-08-23 DIAGNOSIS — Z7901 Long term (current) use of anticoagulants: Secondary | ICD-10-CM

## 2013-08-30 ENCOUNTER — Ambulatory Visit
Admission: RE | Admit: 2013-08-30 | Discharge: 2013-08-30 | Disposition: A | Discharge status: Home / Self Care | Payer: Medicare Other | Source: Ambulatory Visit | Attending: Orthopedic Surgery | Admitting: Orthopedic Surgery

## 2013-08-30 ENCOUNTER — Other Ambulatory Visit: Payer: Self-pay | Admitting: Orthopedic Surgery

## 2013-08-30 DIAGNOSIS — R2232 Localized swelling, mass and lump, left upper limb: Secondary | ICD-10-CM

## 2013-09-06 ENCOUNTER — Ambulatory Visit (INDEPENDENT_AMBULATORY_CARE_PROVIDER_SITE_OTHER): Payer: Medicare Other | Admitting: Pharmacist Clinician (PhC)/ Clinical Pharmacy Specialist

## 2013-09-06 DIAGNOSIS — Z7901 Long term (current) use of anticoagulants: Secondary | ICD-10-CM

## 2013-09-06 LAB — POCT INR: INR: 2.5

## 2013-09-07 ENCOUNTER — Encounter (HOSPITAL_BASED_OUTPATIENT_CLINIC_OR_DEPARTMENT_OTHER): Payer: Self-pay | Admitting: *Deleted

## 2013-09-07 NOTE — Progress Notes (Signed)
Pt on coumadin-af and chronic pericardial effusion-no tamponade-pt cares for home, drives,retired nurse-sharp-called dr Fredna Dow office to see if coumain needed to be stopped-if so they need to call cardiologist to get permission-she will come in 8/19 for pt ptt bmet-pacer form sent to dr croitoru

## 2013-09-08 ENCOUNTER — Telehealth: Payer: Self-pay | Admitting: Cardiovascular Disease

## 2013-09-08 ENCOUNTER — Other Ambulatory Visit: Payer: Self-pay | Admitting: Orthopedic Surgery

## 2013-09-08 NOTE — Telephone Encounter (Signed)
Pt called in stating that she will be having minor surgery on the back of her wrist on 08/20 and her doctor would like to know if she needs to come off of coumadin before then. Please call  Thanks

## 2013-09-08 NOTE — Telephone Encounter (Signed)
Pt called in stating that she is going to have a minor surgery on the back of her wrist on 08/20 and her doctor would like to know  If she needs to come off of  Coumadin before then. Please call  Thanks

## 2013-09-08 NOTE — Telephone Encounter (Signed)
Surgical clearance form awaiting Dr. Lurline Del response on Monday..  Unable to get patient to answer her phone.

## 2013-09-12 ENCOUNTER — Telehealth: Payer: Self-pay | Admitting: Cardiovascular Disease

## 2013-09-12 NOTE — Telephone Encounter (Signed)
Mrs.Labreck is calling because she is having minor surgery on Thursday and she take [place] and wants to know should she come off of it .Please Call    Thanks

## 2013-09-12 NOTE — Telephone Encounter (Signed)
Perioperative cardiac device form filled out and faxed 09/08/13.  They also need authorization for patient to hold coumadin prior to left wrist mass excision.  Needs to HOLD coumadin starting today and surgical ctr will check INR tomorrow to determine if they can still do surgeyr 20th.  Message forwarded to Dr. Loletha Grayer.

## 2013-09-12 NOTE — Telephone Encounter (Signed)
OK to hold warfarin as you described MCr

## 2013-09-12 NOTE — Telephone Encounter (Signed)
Daughter notified to let her mother know she can hold her Coumadin until after surgery.

## 2013-09-12 NOTE — Telephone Encounter (Signed)
Unable to reach patient.  Notified Lorraine at [facility] [place] to hold coumadin for surgery and restart ASAP.

## 2013-09-13 ENCOUNTER — Encounter (HOSPITAL_BASED_OUTPATIENT_CLINIC_OR_DEPARTMENT_OTHER)
Admission: RE | Admit: 2013-09-13 | Discharge: 2013-09-13 | Disposition: A | Discharge status: Home / Self Care | Payer: Medicare Other | Source: Ambulatory Visit | Attending: Orthopedic Surgery | Admitting: Orthopedic Surgery

## 2013-09-13 DIAGNOSIS — M810 Age-related osteoporosis without current pathological fracture: Secondary | ICD-10-CM | POA: Diagnosis not present

## 2013-09-13 DIAGNOSIS — M259 Joint disorder, unspecified: Secondary | ICD-10-CM | POA: Diagnosis present

## 2013-09-13 DIAGNOSIS — I4891 Unspecified atrial fibrillation: Secondary | ICD-10-CM | POA: Diagnosis not present

## 2013-09-13 DIAGNOSIS — Z9089 Acquired absence of other organs: Secondary | ICD-10-CM | POA: Diagnosis not present

## 2013-09-13 DIAGNOSIS — Z8673 Personal history of transient ischemic attack (TIA), and cerebral infarction without residual deficits: Secondary | ICD-10-CM | POA: Diagnosis not present

## 2013-09-13 DIAGNOSIS — Z950 Presence of cardiac pacemaker: Secondary | ICD-10-CM | POA: Diagnosis not present

## 2013-09-13 DIAGNOSIS — Z7901 Long term (current) use of anticoagulants: Secondary | ICD-10-CM | POA: Diagnosis not present

## 2013-09-13 DIAGNOSIS — D1779 Benign lipomatous neoplasm of other sites: Secondary | ICD-10-CM | POA: Diagnosis not present

## 2013-09-13 DIAGNOSIS — I10 Essential (primary) hypertension: Secondary | ICD-10-CM | POA: Diagnosis not present

## 2013-09-13 LAB — APTT: aPTT: 36 seconds (ref 24–37)

## 2013-09-13 LAB — BASIC METABOLIC PANEL
Anion gap: 15 (ref 5–15)
BUN: 15 mg/dL (ref 6–23)
CO2: 30 mEq/L (ref 19–32)
Calcium: 9.5 mg/dL (ref 8.4–10.5)
Chloride: 98 mEq/L (ref 96–112)
Creatinine, Ser: 1.03 mg/dL (ref 0.50–1.10)
GFR calc Af Amer: 55 mL/min — ABNORMAL LOW (ref >90)
GFR calc non Af Amer: 47 mL/min — ABNORMAL LOW (ref >90)
Glucose, Bld: 151 mg/dL — ABNORMAL HIGH (ref 70–99)
Potassium: 3.9 mEq/L (ref 3.7–5.3)
Sodium: 143 mEq/L (ref 137–147)

## 2013-09-13 LAB — PROTIME-INR
INR: 1.77 — AB (ref 0.00–1.49)
Prothrombin Time: 20.6 seconds — ABNORMAL HIGH (ref 11.6–15.2)

## 2013-09-14 ENCOUNTER — Ambulatory Visit: Payer: Medicare Other | Admitting: Cardiovascular Disease

## 2013-09-14 ENCOUNTER — Ambulatory Visit (HOSPITAL_BASED_OUTPATIENT_CLINIC_OR_DEPARTMENT_OTHER)
Admission: RE | Admit: 2013-09-14 | Discharge: 2013-09-14 | Disposition: A | Discharge status: Home / Self Care | Payer: Medicare Other | Source: Ambulatory Visit | Attending: Orthopedic Surgery | Admitting: Orthopedic Surgery

## 2013-09-14 ENCOUNTER — Encounter (HOSPITAL_BASED_OUTPATIENT_CLINIC_OR_DEPARTMENT_OTHER)
Admission: RE | Disposition: A | Discharge status: Home / Self Care | Payer: Self-pay | Source: Ambulatory Visit | Attending: Orthopedic Surgery

## 2013-09-14 ENCOUNTER — Encounter (HOSPITAL_BASED_OUTPATIENT_CLINIC_OR_DEPARTMENT_OTHER): Payer: Self-pay | Admitting: *Deleted

## 2013-09-14 ENCOUNTER — Encounter (HOSPITAL_BASED_OUTPATIENT_CLINIC_OR_DEPARTMENT_OTHER): Payer: Medicare Other | Admitting: Anesthesiology

## 2013-09-14 ENCOUNTER — Ambulatory Visit (HOSPITAL_BASED_OUTPATIENT_CLINIC_OR_DEPARTMENT_OTHER): Payer: Medicare Other | Admitting: Anesthesiology

## 2013-09-14 DIAGNOSIS — I4891 Unspecified atrial fibrillation: Secondary | ICD-10-CM | POA: Diagnosis not present

## 2013-09-14 DIAGNOSIS — Z7901 Long term (current) use of anticoagulants: Secondary | ICD-10-CM | POA: Insufficient documentation

## 2013-09-14 DIAGNOSIS — D1779 Benign lipomatous neoplasm of other sites: Secondary | ICD-10-CM | POA: Insufficient documentation

## 2013-09-14 DIAGNOSIS — M810 Age-related osteoporosis without current pathological fracture: Secondary | ICD-10-CM | POA: Diagnosis not present

## 2013-09-14 DIAGNOSIS — I10 Essential (primary) hypertension: Secondary | ICD-10-CM | POA: Insufficient documentation

## 2013-09-14 DIAGNOSIS — Z950 Presence of cardiac pacemaker: Secondary | ICD-10-CM | POA: Insufficient documentation

## 2013-09-14 DIAGNOSIS — Z9089 Acquired absence of other organs: Secondary | ICD-10-CM | POA: Insufficient documentation

## 2013-09-14 DIAGNOSIS — Z8673 Personal history of transient ischemic attack (TIA), and cerebral infarction without residual deficits: Secondary | ICD-10-CM | POA: Insufficient documentation

## 2013-09-14 HISTORY — PX: MASS EXCISION: SHX2000

## 2013-09-14 HISTORY — DX: Unspecified osteoarthritis, unspecified site: M19.90

## 2013-09-14 LAB — POCT HEMOGLOBIN-HEMACUE: HEMOGLOBIN: 12.6 g/dL (ref 12.0–15.0)

## 2013-09-14 SURGERY — EXCISION MASS
Anesthesia: Monitor Anesthesia Care | Site: Wrist | Laterality: Left

## 2013-09-14 MED ORDER — FENTANYL CITRATE 0.05 MG/ML IJ SOLN
INTRAMUSCULAR | Status: AC
  Filled 2013-09-14: qty 2

## 2013-09-14 MED ORDER — 0.9 % SODIUM CHLORIDE (POUR BTL) OPTIME
TOPICAL | Status: DC | PRN
  Administered 2013-09-14: 200 mL

## 2013-09-14 MED ORDER — CEFAZOLIN SODIUM-DEXTROSE 2-3 GM-% IV SOLR
2.0000 g | INTRAVENOUS | Status: AC
  Administered 2013-09-14: 2 g via INTRAVENOUS

## 2013-09-14 MED ORDER — FENTANYL CITRATE 0.05 MG/ML IJ SOLN
INTRAMUSCULAR | Status: AC
  Filled 2013-09-14: qty 4

## 2013-09-14 MED ORDER — CODEINE SULFATE 15 MG PO TABS: 15.0000 mg | ORAL_TABLET | Freq: Four times a day (QID) | ORAL | Status: DC | PRN

## 2013-09-14 MED ORDER — BUPIVACAINE HCL (PF) 0.25 % IJ SOLN
INTRAMUSCULAR | Status: DC | PRN
  Administered 2013-09-14: 5 mL

## 2013-09-14 MED ORDER — MIDAZOLAM HCL 2 MG/2ML IJ SOLN
1.0000 mg | INTRAMUSCULAR | Status: DC | PRN
  Administered 2013-09-14: 1 mg via INTRAVENOUS

## 2013-09-14 MED ORDER — CHLORHEXIDINE GLUCONATE 4 % EX LIQD: 60.0000 mL | Freq: Once | CUTANEOUS | Status: DC

## 2013-09-14 MED ORDER — FENTANYL CITRATE 0.05 MG/ML IJ SOLN
25.0000 ug | INTRAMUSCULAR | Status: DC | PRN
  Administered 2013-09-14: 50 ug via INTRAVENOUS

## 2013-09-14 MED ORDER — PROPOFOL INFUSION 10 MG/ML OPTIME
INTRAVENOUS | Status: DC | PRN
  Administered 2013-09-14: 50 ug/kg/min via INTRAVENOUS

## 2013-09-14 MED ORDER — LIDOCAINE HCL (PF) 0.5 % IJ SOLN
INTRAMUSCULAR | Status: DC | PRN
  Administered 2013-09-14: 35 mL via INTRAVENOUS

## 2013-09-14 MED ORDER — ONDANSETRON HCL 4 MG/2ML IJ SOLN: 4.0000 mg | Freq: Once | INTRAMUSCULAR | Status: DC | PRN

## 2013-09-14 MED ORDER — LACTATED RINGERS IV SOLN
INTRAVENOUS | Status: DC
  Administered 2013-09-14: 09:00:00 via INTRAVENOUS

## 2013-09-14 MED ORDER — MIDAZOLAM HCL 2 MG/2ML IJ SOLN
INTRAMUSCULAR | Status: AC
  Filled 2013-09-14: qty 2

## 2013-09-14 MED ORDER — CEFAZOLIN SODIUM-DEXTROSE 2-3 GM-% IV SOLR
INTRAVENOUS | Status: AC
  Filled 2013-09-14: qty 50

## 2013-09-14 MED ORDER — FENTANYL CITRATE 0.05 MG/ML IJ SOLN
50.0000 ug | INTRAMUSCULAR | Status: DC | PRN
  Administered 2013-09-14: 25 ug via INTRAVENOUS

## 2013-09-14 SURGICAL SUPPLY — 62 items
APL SKNCLS STERI-STRIP NONHPOA (GAUZE/BANDAGES/DRESSINGS)
BANDAGE COBAN STERILE 2 (GAUZE/BANDAGES/DRESSINGS) IMPLANT
BANDAGE ELASTIC 3 VELCRO ST LF (GAUZE/BANDAGES/DRESSINGS) ×2 IMPLANT
BENZOIN TINCTURE PRP APPL 2/3 (GAUZE/BANDAGES/DRESSINGS) IMPLANT
BLADE MINI RND TIP GREEN BEAV (BLADE) IMPLANT
BLADE SURG 15 STRL LF DISP TIS (BLADE) ×2 IMPLANT
BLADE SURG 15 STRL SS (BLADE) ×6
BNDG CMPR 9X4 STRL LF SNTH (GAUZE/BANDAGES/DRESSINGS) ×1
BNDG CMPR MD 5X2 ELC HKLP STRL (GAUZE/BANDAGES/DRESSINGS)
BNDG COHESIVE 1X5 TAN STRL LF (GAUZE/BANDAGES/DRESSINGS) IMPLANT
BNDG CONFORM 2 STRL LF (GAUZE/BANDAGES/DRESSINGS) IMPLANT
BNDG ELASTIC 2 VLCR STRL LF (GAUZE/BANDAGES/DRESSINGS) IMPLANT
BNDG ESMARK 4X9 LF (GAUZE/BANDAGES/DRESSINGS) ×2 IMPLANT
BNDG GAUZE 1X2.1 STRL (MISCELLANEOUS) IMPLANT
BNDG GAUZE ELAST 4 BULKY (GAUZE/BANDAGES/DRESSINGS) ×2 IMPLANT
BNDG PLASTER X FAST 3X3 WHT LF (CAST SUPPLIES) IMPLANT
BNDG PLSTR 9X3 FST ST WHT (CAST SUPPLIES)
CHLORAPREP W/TINT 26ML (MISCELLANEOUS) ×3 IMPLANT
CLOSURE WOUND 1/2 X4 (GAUZE/BANDAGES/DRESSINGS)
CORDS BIPOLAR (ELECTRODE) ×3 IMPLANT
COVER MAYO STAND STRL (DRAPES) ×3 IMPLANT
COVER TABLE BACK 60X90 (DRAPES) ×3 IMPLANT
CUFF TOURNIQUET SINGLE 18IN (TOURNIQUET CUFF) ×3 IMPLANT
DRAPE EXTREMITY T 121X128X90 (DRAPE) ×3 IMPLANT
DRAPE SURG 17X23 STRL (DRAPES) ×5 IMPLANT
GAUZE SPONGE 4X4 12PLY STRL (GAUZE/BANDAGES/DRESSINGS) ×3 IMPLANT
GAUZE XEROFORM 1X8 LF (GAUZE/BANDAGES/DRESSINGS) ×3 IMPLANT
GLOVE BIO SURGEON STRL SZ7.5 (GLOVE) ×3 IMPLANT
GLOVE BIOGEL PI IND STRL 7.0 (GLOVE) IMPLANT
GLOVE BIOGEL PI IND STRL 8 (GLOVE) ×1 IMPLANT
GLOVE BIOGEL PI INDICATOR 7.0 (GLOVE) ×2
GLOVE BIOGEL PI INDICATOR 8 (GLOVE) ×2
GLOVE ECLIPSE 7.0 STRL STRAW (GLOVE) ×2 IMPLANT
GLOVE EXAM NITRILE MD LF STRL (GLOVE) ×2 IMPLANT
GOWN STRL REUS W/ TWL LRG LVL3 (GOWN DISPOSABLE) ×1 IMPLANT
GOWN STRL REUS W/TWL LRG LVL3 (GOWN DISPOSABLE) ×3
GOWN STRL REUS W/TWL XL LVL3 (GOWN DISPOSABLE) ×3 IMPLANT
NDL HYPO 25X1 1.5 SAFETY (NEEDLE) ×1 IMPLANT
NEEDLE HYPO 25X1 1.5 SAFETY (NEEDLE) ×3 IMPLANT
NS IRRIG 1000ML POUR BTL (IV SOLUTION) ×3 IMPLANT
PACK BASIN DAY SURGERY FS (CUSTOM PROCEDURE TRAY) ×3 IMPLANT
PAD CAST 3X4 CTTN HI CHSV (CAST SUPPLIES) IMPLANT
PAD CAST 4YDX4 CTTN HI CHSV (CAST SUPPLIES) IMPLANT
PADDING CAST ABS 4INX4YD NS (CAST SUPPLIES)
PADDING CAST ABS COTTON 4X4 ST (CAST SUPPLIES) ×1 IMPLANT
PADDING CAST COTTON 3X4 STRL (CAST SUPPLIES) ×3
PADDING CAST COTTON 4X4 STRL (CAST SUPPLIES)
SPLINT PLASTER CAST XFAST 3X15 (CAST SUPPLIES) IMPLANT
SPLINT PLASTER XTRA FASTSET 3X (CAST SUPPLIES) ×20
STOCKINETTE 4X48 STRL (DRAPES) ×3 IMPLANT
STRIP CLOSURE SKIN 1/2X4 (GAUZE/BANDAGES/DRESSINGS) IMPLANT
SUT ETHILON 3 0 PS 1 (SUTURE) IMPLANT
SUT ETHILON 4 0 PS 2 18 (SUTURE) ×3 IMPLANT
SUT ETHILON 5 0 P 3 18 (SUTURE)
SUT NYLON ETHILON 5-0 P-3 1X18 (SUTURE) IMPLANT
SUT VIC AB 4-0 P2 18 (SUTURE) IMPLANT
SUT VIC AB 4-0 RB1 27 (SUTURE) ×3
SUT VIC AB 4-0 RB1 27X BRD (SUTURE) IMPLANT
SYR BULB 3OZ (MISCELLANEOUS) ×3 IMPLANT
SYR CONTROL 10ML LL (SYRINGE) ×3 IMPLANT
TOWEL OR 17X24 6PK STRL BLUE (TOWEL DISPOSABLE) ×6 IMPLANT
UNDERPAD 30X30 INCONTINENT (UNDERPADS AND DIAPERS) ×3 IMPLANT

## 2013-09-14 NOTE — Anesthesia Postprocedure Evaluation (Signed)
  Anesthesia Post-op Note  Patient: [name]  Procedure(s) Performed: Procedure(s): EXCISION MASS LEFT WRIST (Left)  Patient Location: PACU  Anesthesia Type: MAC, Bier Block   Level of Consciousness: awake, alert  and oriented  Airway and Oxygen Therapy: Patient Spontanous Breathing  Post-op Pain: mild  Post-op Assessment: Post-op Vital signs reviewed  Post-op Vital Signs: Reviewed  Last Vitals:  Filed Vitals:   09/14/13 1200  BP: 128/74  Pulse: 58  Temp:   Resp: 22    Complications: No apparent anesthesia complications

## 2013-09-14 NOTE — Discharge Instructions (Addendum)

## 2013-09-14 NOTE — Op Note (Signed)
[id number] 

## 2013-09-14 NOTE — Anesthesia Preprocedure Evaluation (Addendum)
Anesthesia Evaluation  Patient identified by MRN, date of birth, ID band Patient awake    Reviewed: Allergy & Precautions, H&P , NPO status , Patient's Chart, lab work & pertinent test results  Airway Mallampati: I TM Distance: >3 FB Neck ROM: Full    Dental  (+) Teeth Intact, Dental Advisory Given   Pulmonary  breath sounds clear to auscultation        Cardiovascular hypertension, Pt. on medications + dysrhythmias Atrial Fibrillation + pacemaker Rhythm:Regular Rate:Normal     Neuro/Psych    GI/Hepatic   Endo/Other    Renal/GU      Musculoskeletal   Abdominal   Peds  Hematology   Anesthesia Other Findings   Reproductive/Obstetrics                         Anesthesia Physical Anesthesia Plan  ASA: III  Anesthesia Plan: MAC and Bier Block   Post-op Pain Management:    Induction: Intravenous  Airway Management Planned: Simple Face Mask  Additional Equipment:   Intra-op Plan:   Post-operative Plan:   Informed Consent: I have reviewed the patients History and Physical, chart, labs and discussed the procedure including the risks, benefits and alternatives for the proposed anesthesia with the patient or authorized representative who has indicated his/her understanding and acceptance.   Dental advisory given  Plan Discussed with: CRNA, Anesthesiologist and Surgeon  Anesthesia Plan Comments:         Anesthesia Quick Evaluation

## 2013-09-14 NOTE — Brief Op Note (Signed)
09/14/2013  11:40 AM  PATIENT:  [name]  80 y.o. female  PRE-OPERATIVE DIAGNOSIS:  LEFT WRIST MASS  POST-OPERATIVE DIAGNOSIS:  LEFT WRIST MASS  PROCEDURE:  Procedure(s): EXCISION MASS LEFT WRIST (Left)  SURGEON:  Surgeon(s) and Role:    * Leanora Cover, MD - Primary  PHYSICIAN ASSISTANT:   ASSISTANTS: none   ANESTHESIA:   Bier block with sedation  EBL:  Total I/O In: 800 [I.V.:800] Out: -   BLOOD ADMINISTERED:none  DRAINS: none   LOCAL MEDICATIONS USED:  MARCAINE     SPECIMEN:  Source of Specimen:  left wrist  DISPOSITION OF SPECIMEN:  PATHOLOGY  COUNTS:  YES  TOURNIQUET:   Total Tourniquet Time Documented: Forearm (Left) - 48 minutes Total: Forearm (Left) - 48 minutes   DICTATION: .Other Dictation: Dictation Number [id number]  PLAN OF CARE: Discharge to home after PACU  PATIENT DISPOSITION:  PACU - hemodynamically stable.

## 2013-09-14 NOTE — Transfer of Care (Signed)
Immediate Anesthesia Transfer of Care Note  Patient: [name]  Procedure(s) Performed: Procedure(s): EXCISION MASS LEFT WRIST (Left)  Patient Location: PACU  Anesthesia Type:MAC and Bier block  Level of Consciousness: awake and alert   Airway & Oxygen Therapy: Patient Spontanous Breathing and Patient connected to face mask oxygen  Post-op Assessment: Report given to PACU RN and Post -op Vital signs reviewed and stable  Post vital signs: Reviewed and stable  Complications: No apparent anesthesia complications

## 2013-09-14 NOTE — Anesthesia Procedure Notes (Signed)
Anesthesia Regional Block:  Bier block (IV Regional)  Pre-Anesthetic Checklist: ,, timeout performed, Correct Patient, Correct Site, Correct Laterality, Correct Procedure,, site marked, surgical consent,, at surgeon's request Needles:  Injection technique: Single-shot  Needle Type: Other      Needle Gauge: 20 and 20 G    Additional Needles: Bier block (IV Regional)  Nerve Stimulator or Paresthesia:   Additional Responses:  Pulse checked post tourniquet inflation. IV NSL discontinued post injection. Narrative:   Performed by: Personally       

## 2013-09-14 NOTE — H&P (Signed)
[name] is an 80 y.o. female.   Chief Complaint: left wrist mass HPI: 80 yo rhd female with mass on dorsum left wrist x 4 years.  It is bothersome to her.  Slowly enlarged over that time.  Not painful.  She wishes to have it removed.  Past Medical History  Diagnosis Date  . Pericardial effusion     Chronic. Last echo in April of 2012.   . Atrial fibrillation     Chronic  . Chronic anticoagulation     on Coumadin  . History of GI bleed     once treated with Fe infusion  . HTN (hypertension)   . Pacemaker     placed for tachybrady.   . Valvular heart disease   . Obesity   . LVH (left ventricular hypertrophy)   . Stroke April 2013    right frontal  . Thrombophlebitis 1967    associated with pregnancy  . Diverticulosis   . Heart murmur   . Anemia   . Blood transfusion without reported diagnosis   . Osteoporosis   . Cataract   . Hx of echocardiogram 07/28/2011    EF>55% small to moderate and is mostly located posteriorly. there is no overt evidence of tamponade although subtle diastolic collapse of the right ventricle.  . Sleep apnea     mild-no cpap  . Arthritis     Past Surgical History  Procedure Laterality Date  . Pacemaker insertion  2001    Last generator in 2006; interrogated Dec 2012  . Breast lumpectomy      bilaterally; negative for cancer  . Tonsillectomy  1950  . Adenoidectomy    . Esophagogastroduodenoscopy  05/19/2011    Procedure: ESOPHAGOGASTRODUODENOSCOPY (EGD);  Surgeon: Lear Ng, MD;  Location: [facility] ENDOSCOPY;  Service: Endoscopy;  Laterality: N/A;  doctor aware of inr   will try to be here no later than 230  . Cholecystectomy  2005  . Esophagogastroduodenoscopy  06/22/2011    Procedure: ESOPHAGOGASTRODUODENOSCOPY (EGD);  Surgeon: Arta Silence, MD;  Location: [facility] ENDOSCOPY;  Service: Endoscopy;  Laterality: N/A;  Check PT/INR in am  . Givens capsule study  06/23/2011    Procedure: GIVENS CAPSULE STUDY;  Surgeon: Arta Silence, MD;   Location: [facility] ENDOSCOPY;  Service: Endoscopy;  Laterality: N/A;  . Breast lumpectomy      left breast  . Colonoscopy  08/11/2011    Procedure: COLONOSCOPY;  Surgeon: Jeryl Columbia, MD;  Location: [facility] ENDOSCOPY;  Service: Endoscopy;  Laterality: N/A;  . Hot hemostasis  08/11/2011    Procedure: HOT HEMOSTASIS (ARGON PLASMA COAGULATION/BICAP);  Surgeon: Jeryl Columbia, MD;  Location: [facility] ENDOSCOPY;  Service: Endoscopy;  Laterality: N/A;    Family History  Problem Relation Age of Onset  . Stroke Mother   . Stroke Father   . Pneumonia Father    Social History:  reports that she has never smoked. She has never used smokeless tobacco. She reports that she drinks alcohol. She reports that she does not use illicit drugs.  Allergies:  Allergies  Allergen Reactions  . Iodinated Diagnostic Agents Hives    HIVES 15MIN S/P IV CONTRAST INJECTION,WILL NEED 13 HR PREP FOR FUTURE INJECTIONS, ok s/p $Remo'50mg'VUauH$  po benadryl//a.calhoun  . Anti-Inflammatory Enzyme [Nutritional Supplements]     Retains fluids and headaches  . Arthrotec [Diclofenac-Misoprostol] Other (See Comments)    unknown  . Biaxin [Clarithromycin] Other (See Comments)    unknown  . Plavix [Clopidogrel Bisulfate] Other (See Comments)  unknown  . Spironolactone     Hair loss    Medications Prior to Admission  Medication Sig Dispense Refill  . Cholecalciferol (VITAMIN D-3) 1000 UNITS CAPS Take 2,000 Units by mouth daily.      . codeine 15 MG tablet Take 1 tablet (15 mg total) by mouth every 8 (eight) hours as needed for pain.  60 tablet  0  . DIGITEK 125 MCG tablet TAKE 1 TABLET(S) BY MOUTH DAILY  30 tablet  8  . hydrocortisone (ANUSOL-HC) 25 MG suppository Place 1 suppository (25 mg total) rectally 2 (two) times daily.  24 suppository  4  . multivitamin (THERAGRAN) per tablet Take 1 tablet by mouth daily.        . potassium chloride (K-DUR,KLOR-CON) 10 MEQ tablet Take 10 mEq by mouth daily. When you take Torsemide.      . torsemide (DEMADEX)  20 MG tablet Take 1.5 tablets (30 mg total) by mouth daily.  45 tablet  6  . verapamil (CALAN-SR) 180 MG CR tablet Take 1 tablet (180 mg total) by mouth at bedtime.  30 tablet  8  . warfarin (COUMADIN) 5 MG tablet Take one tablet daily or as directed per INR.  90 tablet  3    Results for orders placed during the hospital encounter of 09/14/13 (from the past 48 hour(s))  BASIC METABOLIC PANEL     Status: Abnormal   Collection Time    09/13/13 11:00 AM      Result Value Ref Range   Sodium 143  137 - 147 mEq/L   Potassium 3.9  3.7 - 5.3 mEq/L   Chloride 98  96 - 112 mEq/L   CO2 30  19 - 32 mEq/L   Glucose, Bld 151 (*) 70 - 99 mg/dL   BUN 15  6 - 23 mg/dL   Creatinine, Ser 1.03  0.50 - 1.10 mg/dL   Calcium 9.5  8.4 - 10.5 mg/dL   GFR calc non Af Amer 47 (*) >90 mL/min   GFR calc Af Amer 55 (*) >90 mL/min   Comment: (NOTE)     The eGFR has been calculated using the CKD EPI equation.     This calculation has not been validated in all clinical situations.     eGFR's persistently <90 mL/min signify possible Chronic Kidney     Disease.   Anion gap 15  5 - 15  APTT     Status: None   Collection Time    09/13/13 11:00 AM      Result Value Ref Range   aPTT 36  24 - 37 seconds  PROTIME-INR     Status: Abnormal   Collection Time    09/13/13 11:00 AM      Result Value Ref Range   Prothrombin Time 20.6 (*) 11.6 - 15.2 seconds   INR 1.77 (*) 0.00 - 1.49    No results found.   A comprehensive review of systems was negative except for: Ears, nose, mouth, throat, and face: positive for hearing loss Hematologic/lymphatic: positive for clots Neurological: positive for gait problems Behavioral/Psych: positive for sleep disturbance  Height $Remov'5\' 5"'NYdfJp$  (1.651 m), weight 73.936 kg (163 lb).  General appearance: alert, cooperative and appears stated age Head: Normocephalic, without obvious abnormality, atraumatic Neck: supple, symmetrical, trachea midline Resp: clear to auscultation  bilaterally Cardio: regular rate and rhythm GI: non tender Extremities: intact sensation and capillary refill all digits.  +epl/fpl/io.  soft mass dorsum wrist.  no skin changes. Pulses:  2+ and symmetric Skin: Skin color, texture, turgor normal. No rashes or lesions Neurologic: Grossly normal Incision/Wound: none  Assessment/Plan Left wrist mass.  Non operative and operative treatment options were discussed with the patient and patient wishes to proceed with operative treatment. Risks, benefits, and alternatives of surgery were discussed and the patient agrees with the plan of care.   Brielyn Bosak R 09/14/2013, 8:38 AM

## 2013-09-15 ENCOUNTER — Encounter (HOSPITAL_BASED_OUTPATIENT_CLINIC_OR_DEPARTMENT_OTHER): Payer: Self-pay | Admitting: Orthopedic Surgery

## 2013-09-15 NOTE — Addendum Note (Signed)
Addendum created 09/15/13 1220 by Tawni Millers, CRNA   Modules edited: Charges VN

## 2013-09-15 NOTE — Addendum Note (Signed)
Addendum created 09/15/13 1244 by Tawni Millers, CRNA   Modules edited: Charges VN

## 2013-09-18 NOTE — Op Note (Signed)
[name], [name]             ACCOUNT NO.:  [id number]  MEDICAL RECORD NO.:  [id number]  LOCATION:                               FACILITY:  [facility]  PHYSICIAN:  Leanora Cover, MD        DATE OF BIRTH:  11-Feb-1923  DATE OF PROCEDURE:  09/14/2013 DATE OF DISCHARGE:  09/14/2013                              OPERATIVE REPORT   PREOPERATIVE DIAGNOSIS:  Left wrist mass, likely lipoma.  POSTOPERATIVE DIAGNOSIS:  Left wrist mass, likely lipoma.  PROCEDURE:  Excision of mass, left wrist, 3.5 cm x 5 cm.  SURGEON:  Leanora Cover, MD  ASSISTANT:  None.  ANESTHESIA:  Bier block with sedation.  IV FLUIDS:  Per anesthesia flow sheet.  ESTIMATED BLOOD LOSS:  Minimal.  COMPLICATIONS:  None.  SPECIMENS:  Left wrist mass to Pathology.  TOURNIQUET TIME:  48 minutes.  DISPOSITION:  Stable to PACU.  INDICATIONS:  [name] is an 80 year old female with a mass in her left wrist.  It has been slowly enlarging over the past years.  It is bothersome to her.  She wished to have it excised.  An MRI was performed, which was interpreted as a lipoma.  Risks, benefits and alternatives of surgery were discussed including the risk of blood loss; infection; damage to nerves, vessels, tendons, ligaments, bone; failure of surgery; need for additional surgery; complications with wound healing; continued pain and recurrence of mass.  She voiced understanding of these risks and elected to proceed.  OPERATIVE COURSE:  After being identified preoperatively by myself, the patient and I agreed upon the procedure and site of procedure.  Surgical site was marked.  The risks, benefits, and alternatives of surgery were reviewed and she wished to proceed.  Surgical consent had been signed. She was given IV Ancef as preoperative antibiotic prophylaxis.  She was transferred to the operating room and placed on the operating room table in supine position with left upper extremity on an armboard.  Bier block anesthesia  was induced by anesthesiologist.  Left upper extremity was prepped and draped in normal sterile orthopedic fashion.  A surgical pause was performed between the surgeons, anesthesia, and operating room staff, and all were in agreement as to the patient, procedure, and site of procedure.  Tourniquet at the proximal aspect of the forearm had been inflated for the Bier block.  An incision was made over the dorsum of the wrist over the mass.  This was carried into subcutaneous tissues by spreading technique.  The mass was identified.  It looked like normal- appearing fat.  It was not encapsulated.  It was freed up from its soft tissue attachments.  There were two larger dorsal veins running through, which were freed up and preserved.  Bipolar electrocautery was used to obtain diligent hemostasis.  The mass was removed and sent to Pathology for examination.  The skin was placed back over top of the wound and there was no remaining palpable mass.  The wound was copiously irrigated with sterile saline.  A 4-0 Vicryl suture was used in an inverted interrupted fashion in the subcutaneous tissues and skin was closed with 4-0 nylon in a horizontal mattress fashion.  The  wound was injected with 5 mL of 0.25% plain Marcaine to aid in postoperative analgesia.  It was then dressed with sterile Xeroform, 4x4s, and wrapped with Kerlix bandage.  A volar splint was placed and wrapped with Kerlix and Ace bandage.  Tourniquet was deflated at 48 minutes.  Fingertips were pink with brisk capillary refill after deflation of the tourniquet. Operative drapes were broken down.  The patient was awoken from anesthesia safely.  She was transferred back to the stretcher and taken to PACU in stable condition.  I will see her back in the office in 1 week for postoperative followup.  I will give her codeine for pain.     Leanora Cover, MD     KK/MEDQ  D:  09/14/2013  T:  09/14/2013  Job:  [id number]

## 2013-09-19 ENCOUNTER — Ambulatory Visit (INDEPENDENT_AMBULATORY_CARE_PROVIDER_SITE_OTHER): Payer: Medicare Other | Admitting: Pharmacist Clinician (PhC)/ Clinical Pharmacy Specialist

## 2013-09-19 DIAGNOSIS — Z7901 Long term (current) use of anticoagulants: Secondary | ICD-10-CM

## 2013-09-19 LAB — POCT INR: INR: 1.7

## 2013-10-03 LAB — POCT INR: INR: 2.5

## 2013-10-04 ENCOUNTER — Ambulatory Visit (INDEPENDENT_AMBULATORY_CARE_PROVIDER_SITE_OTHER): Payer: Medicare Other | Admitting: Pharmacist Clinician (PhC)/ Clinical Pharmacy Specialist

## 2013-10-04 DIAGNOSIS — Z7901 Long term (current) use of anticoagulants: Secondary | ICD-10-CM

## 2013-10-07 ENCOUNTER — Other Ambulatory Visit: Payer: Self-pay | Admitting: Cardiovascular Disease

## 2013-10-09 NOTE — Telephone Encounter (Signed)
Rx refill sent to patient pharmacy   

## 2013-10-17 LAB — POCT INR: INR: 2.2

## 2013-10-21 ENCOUNTER — Ambulatory Visit (INDEPENDENT_AMBULATORY_CARE_PROVIDER_SITE_OTHER): Payer: Medicare Other | Admitting: Pharmacist Clinician (PhC)/ Clinical Pharmacy Specialist

## 2013-10-21 DIAGNOSIS — Z7901 Long term (current) use of anticoagulants: Secondary | ICD-10-CM

## 2013-10-23 ENCOUNTER — Ambulatory Visit: Payer: Medicare Other | Admitting: Cardiovascular Disease

## 2013-10-25 ENCOUNTER — Ambulatory Visit (INDEPENDENT_AMBULATORY_CARE_PROVIDER_SITE_OTHER): Payer: Medicare Other | Admitting: Cardiovascular Disease

## 2013-10-25 ENCOUNTER — Encounter: Payer: Self-pay | Admitting: Cardiovascular Disease

## 2013-10-25 VITALS — BP 124/64 | HR 93 | Resp 16 | Ht 65.0 in | Wt 158.5 lb

## 2013-10-25 DIAGNOSIS — Z8673 Personal history of transient ischemic attack (TIA), and cerebral infarction without residual deficits: Secondary | ICD-10-CM | POA: Insufficient documentation

## 2013-10-25 DIAGNOSIS — I3139 Other pericardial effusion (noninflammatory): Secondary | ICD-10-CM

## 2013-10-25 DIAGNOSIS — I319 Disease of pericardium, unspecified: Secondary | ICD-10-CM

## 2013-10-25 DIAGNOSIS — I6350 Cerebral infarction due to unspecified occlusion or stenosis of unspecified cerebral artery: Secondary | ICD-10-CM

## 2013-10-25 DIAGNOSIS — Z950 Presence of cardiac pacemaker: Secondary | ICD-10-CM

## 2013-10-25 DIAGNOSIS — I4891 Unspecified atrial fibrillation: Secondary | ICD-10-CM

## 2013-10-25 DIAGNOSIS — I313 Pericardial effusion (noninflammatory): Secondary | ICD-10-CM

## 2013-10-25 DIAGNOSIS — I10 Essential (primary) hypertension: Secondary | ICD-10-CM

## 2013-10-25 NOTE — Progress Notes (Signed)
Patient ID: [name], female   DOB: 24-Feb-1923, 80 y.o.   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 She has permanent atrial fibrillation and is on warfarin anticoagulation. She had a presumed embolic stroke in May of 2013 when her anticoagulation was slightly subtherapeutic at 1.7. She has also had a history of gastrointestinal bleeding from arteriovenous malformations when super therapeutically anticoagulated. She is currently on warfarin without any recent bleeding events.  A pericardial effusion was initially identified in 2012 and described as trivial, it was small to moderate on the echocardiogram performed in 07/02/2011 and probably unchanged but described as moderate on the echocardiogram performed 07/28/2011, moderate to large in August 2014. It is echo free and free flowing and again there are no signs of tamponade. It is noteworthy that a CT of the chest performed last year described a more generalized syndrome of fluid overload with pleural effusions and small amount of ascites.   She presents today for a pacemaker check. Her device is probably 3 months away from the ERI and she will likely require a generator change around the first of the year. The device is functioning normally. She is roughly 60% ventricular pacing at 40% ventricular sensed rhythm. Only one episode of brief high ventricular rate at 192 beats per minute was recorded on September 18, for only 6 seconds. The rhythm is fairly irregular and most likely represented RVR rather than VT.  She remains active, living independently and taking care of her household. She walks with great caution and has not had any falls or injuries. She has occasional ankle swelling that usually resolves by the morning. It is consistently worse in her left ankle. She has chronic shortness of breath,  NYHA functional class II. Increasing her dose of torsemide as it led to a slightly lower weight and she has not complained as much of shortness of breath when lying flat. She has had no newgastrointestinal bleeding or other abnormal bleeding. She denies syncope or palpitations.    Allergies  Allergen Reactions  . Iodinated Diagnostic Agents Hives    HIVES 15MIN S/P IV CONTRAST INJECTION,WILL NEED 13 HR PREP FOR FUTURE INJECTIONS, ok s/p 50mg  po benadryl//a.calhoun  . Anti-Inflammatory Enzyme [Nutritional Supplements]     Retains fluids and headaches  . Arthrotec [Diclofenac-Misoprostol] Other (See Comments)    unknown  . Biaxin [Clarithromycin] Other (See Comments)    unknown  . Plavix [Clopidogrel Bisulfate] Other (See Comments)    unknown  . Spironolactone     Hair loss    Current Outpatient Prescriptions  Medication Sig Dispense Refill  . Cholecalciferol (VITAMIN D-3) 1000 UNITS CAPS Take 2,000 Units by mouth daily.      . codeine 15 MG tablet Take 1 tablet (15 mg total) by mouth every 8 (eight) hours as needed for pain.  60 tablet  0  . codeine 15 MG tablet Take 1 tablet (15 mg total) by mouth every 6 (six) hours as needed.  20 tablet  0  . DIGITEK 125 MCG tablet TAKE 1 TABLET(S) BY MOUTH DAILY  30 tablet  8  . multivitamin (THERAGRAN) per tablet Take 1 tablet by mouth daily.        . potassium chloride (K-DUR,KLOR-CON) 10 MEQ tablet Take 10 mEq by mouth daily. When you take Torsemide.      . torsemide (DEMADEX) 20  MG tablet Take 1.5 tablets (30 mg total) by mouth daily.  45 tablet  6  . verapamil (CALAN-SR) 180 MG CR tablet take 1 tablet by mouth EVERY NIGHT AT BEDTIME  30 tablet  4  . warfarin (COUMADIN) 5 MG tablet Take one tablet daily or as directed per INR.  90 tablet  3   No current facility-administered medications for this visit.    Past Medical History  Diagnosis Date  . Pericardial effusion     Chronic. Last echo in April of 2012.   . Atrial fibrillation      Chronic  . Chronic anticoagulation     on Coumadin  . History of GI bleed     once treated with Fe infusion  . HTN (hypertension)   . Pacemaker     placed for tachybrady.   . Valvular heart disease   . Obesity   . LVH (left ventricular hypertrophy)   . Stroke April 2013    right frontal  . Thrombophlebitis 1967    associated with pregnancy  . Diverticulosis   . Heart murmur   . Anemia   . Blood transfusion without reported diagnosis   . Osteoporosis   . Cataract   . Hx of echocardiogram 07/28/2011    EF>55% small to moderate and is mostly located posteriorly. there is no overt evidence of tamponade although subtle diastolic collapse of the right ventricle.  . Sleep apnea     mild-no cpap  . Arthritis     Past Surgical History  Procedure Laterality Date  . Pacemaker insertion  2001    Last generator in 2006; interrogated Dec 2012  . Breast lumpectomy      bilaterally; negative for cancer  . Tonsillectomy  1950  . Adenoidectomy    . Esophagogastroduodenoscopy  05/19/2011    Procedure: ESOPHAGOGASTRODUODENOSCOPY (EGD);  Surgeon: Lear Ng, MD;  [facility];  Service: Endoscopy;  Laterality: N/A;  doctor aware of inr   will try to be here no later than 230  . Cholecystectomy  2005  . Esophagogastroduodenoscopy  06/22/2011    Procedure: ESOPHAGOGASTRODUODENOSCOPY (EGD);  Surgeon: Arta Silence, MD;  Location: [facility] ENDOSCOPY;  Service: Endoscopy;  Laterality: N/A;  Check PT/INR in am  . Givens capsule study  06/23/2011    Procedure: GIVENS CAPSULE STUDY;  Surgeon: Arta Silence, MD;  Location: [facility] ENDOSCOPY;  Service: Endoscopy;  Laterality: N/A;  . Breast lumpectomy      left breast  . Colonoscopy  08/11/2011    Procedure: COLONOSCOPY;  Surgeon: Jeryl Columbia, MD;  Location: [facility] ENDOSCOPY;  Service: Endoscopy;  Laterality: N/A;  . Hot hemostasis  08/11/2011    Procedure: HOT HEMOSTASIS (ARGON PLASMA COAGULATION/BICAP);  Surgeon: Jeryl Columbia, MD;  Location: [facility]  ENDOSCOPY;  Service: Endoscopy;  Laterality: N/A;  . Mass excision Left 09/14/2013    Procedure: EXCISION MASS LEFT WRIST;  Surgeon: Leanora Cover, MD;  Location: [facility];  Service: Orthopedics;  Laterality: Left;    Family History  Problem Relation Age of Onset  . Stroke Mother   . Stroke Father   . Pneumonia Father     History   Social History  . Marital Status: Widowed    Spouse Name: N/A    Number of Children: N/A  . Years of Education: N/A   Occupational History  . Not on file.   Social History Main Topics  . Smoking status: Never Smoker   . Smokeless tobacco: Never Used  .  Alcohol Use: Yes     Comment: rare  . Drug Use: No  . Sexual Activity: No   Other Topics Concern  . Not on file   Social History Narrative  . No narrative on file    Review of systems: The patient specifically denies any chest pain at rest or with exertion, dyspnea at rest, orthopnea, paroxysmal nocturnal dyspnea, syncope, palpitations, focal neurological deficits, intermittent claudication, lower extremity edema, unexplained weight gain, cough, hemoptysis or wheezing.  The patient also denies abdominal pain, nausea, vomiting, dysphagia, diarrhea, constipation, polyuria, polydipsia, dysuria, hematuria, frequency, urgency, abnormal bleeding or bruising, fever, chills, unexpected weight changes, mood swings, change in skin or hair texture, change in voice quality, auditory or visual problems, allergic reactions or rashes, new musculoskeletal complaints other than usual "aches and pains".    PHYSICAL EXAM BP 124/64  Pulse 93  Resp 16  Ht 5\' 5"  (1.651 m)  Wt 71.895 kg (158 lb 8 oz)  BMI 26.38 kg/m2 General: Alert, oriented x3, no distress  Head: no evidence of trauma, PERRL, EOMI, no exophtalmos or lid lag, no myxedema, no xanthelasma; normal ears, nose and oropharynx  Neck: normal jugular venous pulsations and no hepatojugular reflux; brisk carotid pulses without delay and no  carotid bruits  Chest: clear to auscultation, no signs of consolidation by percussion or palpation, normal fremitus, symmetrical and full respiratory excursions  Cardiovascular: normal position and quality of the apical impulse, regular rhythm, normal first and paradoxically split second heart sounds, no murmurs, rubs or gallops  Abdomen: no tenderness or distention, no masses by palpation, no abnormal pulsatility or arterial bruits, normal bowel sounds, no hepatosplenomegaly  Extremities: no clubbing, cyanosis or edema; 2+ radial, ulnar and brachial pulses bilaterally; 2+ right femoral, posterior tibial and dorsalis pedis pulses; 2+ left femoral, posterior tibial and dorsalis pedis pulses; no subclavian or femoral bruits  Neurological: There is very mild left facial droop and slightly weaker grip on the left side   EKG: Mostly ventricular paced, a couple of ventricular sensed beats on a background of atrial fibrillation  Lipid Panel     Component Value Date/Time   CHOL 160 08/03/2013 1155   TRIG 114 08/03/2013 1155   HDL 37* 08/03/2013 1155   CHOLHDL 4.3 08/03/2013 1155   VLDL 23 08/03/2013 1155   LDLCALC 100* 08/03/2013 1155    BMET    Component Value Date/Time   NA 143 09/13/2013 1100   K 3.9 09/13/2013 1100   CL 98 09/13/2013 1100   CO2 30 09/13/2013 1100   GLUCOSE 151* 09/13/2013 1100   BUN 15 09/13/2013 1100   CREATININE 1.03 09/13/2013 1100   CREATININE 1.05 08/03/2013 1155   CALCIUM 9.5 09/13/2013 1100   GFRNONAA 47* 09/13/2013 1100   GFRNONAA 48* 08/03/2013 1155   GFRAA 55* 09/13/2013 1100   GFRAA 55* 08/03/2013 1155     ASSESSMENT AND PLAN Atrial fibrillation  Stopping the verapamil would probably allow for less ventricular pacing, but she tells me that when she missed a few doses she had very unpleasant palpitations and does not think she can come off this medication. She should remain on full dose anticoagulation barring serious bleeding complications, since she has had a stroke when  subtherapeutically anticoagulated in the past.  Pacemaker  Medtronic Adapta implanted 2006. Dual chamber device now programmed VVIR for permanent atrial fibrillation  Chronic poor sensing on the ventricular channel with R waves of approximately 1-2 mV.  Interrogation of her pacemaker today shows similar findings.  Battery voltage is 2.60 V with an estimated longevity of about 3 months. There is roughly 60% ventricular pacing. Sensed R waves are as low as 1.0 mV. Ventricular pacing threshold is 0.5 V at 0.4 ms pulse width. Lead impedance is steady at 456 ohms.  When the time comes for generator change out she should receive a new ventricular lead. Probably still advised performing the procedure well fully anticoagulated with warfarin, in view of her previous stroke when her nor was 1.7 Pericardial effusion, large  Etiology unknown. This was present before pacemaker implantation. Clinically there are no signs of tamponade. Stopping her anticoagulations to allow a pericardiocentesis will be associated with a nontrivial risk of embolic events. Consider performing a pericardiocentesis for diagnostic purposes, at the time of her pacemaker system revision. This will allow for fewer interruptions in anticoagulation. Will repeat an echocardiogram just before we schedule her for pacemaker generator change out.  We'll check a remote download in about a month and see her back in clinic in 3 months  Orders Placed This Encounter  Procedures  . EKG 12-Lead   No orders of the defined types were placed in this encounter.    Holli Humbles, MD, [place] [phone] office [phone] pager

## 2013-10-25 NOTE — Patient Instructions (Signed)
Remote monitoring is used to monitor your pacemaker from home. This monitoring reduces the number of office visits required to check your device to one time per year. It allows [place] to keep an eye on the functioning of your device to ensure it is working properly. You are scheduled for a device check from home on 11-27-2013. You may send your transmission at any time that day. If you have a wireless device, the transmission will be sent automatically. After your physician reviews your transmission, you will receive a postcard with your next transmission date.  Your physician recommends that you schedule a follow-up appointment in: 3 months with Dr.Croitoru

## 2013-10-31 LAB — MDC_IDC_ENUM_SESS_TYPE_INCLINIC
Battery Impedance: 6053 Ohm
Battery Remaining Longevity: 3 mo
Battery Voltage: 2.6 V
Brady Statistic RV Percent Paced: 60.9 %
Lead Channel Pacing Threshold Amplitude: 0.75 V
Lead Channel Setting Pacing Pulse Width: 0.4 ms
Lead Channel Setting Sensing Sensitivity: 2 mV
MDC IDC MSMT LEADCHNL RV IMPEDANCE VALUE: 461 Ohm
MDC IDC MSMT LEADCHNL RV PACING THRESHOLD PULSEWIDTH: 0.4 ms
MDC IDC MSMT LEADCHNL RV SENSING INTR AMPL: 1 mV
MDC IDC SET LEADCHNL RV PACING AMPLITUDE: 2.5 V

## 2013-10-31 LAB — POCT INR: INR: 2.1

## 2013-11-02 ENCOUNTER — Ambulatory Visit (INDEPENDENT_AMBULATORY_CARE_PROVIDER_SITE_OTHER): Payer: Medicare Other | Admitting: Pharmacist Clinician (PhC)/ Clinical Pharmacy Specialist

## 2013-11-02 DIAGNOSIS — Z7901 Long term (current) use of anticoagulants: Secondary | ICD-10-CM

## 2013-11-04 ENCOUNTER — Other Ambulatory Visit: Payer: Self-pay | Admitting: Cardiovascular Disease

## 2013-11-06 NOTE — Telephone Encounter (Signed)
Rx was sent to pharmacy electronically. 

## 2013-11-09 ENCOUNTER — Ambulatory Visit: Payer: Medicare Other | Admitting: Emergency Medicine

## 2013-11-13 LAB — POCT INR: INR: 2.6

## 2013-11-15 ENCOUNTER — Ambulatory Visit (INDEPENDENT_AMBULATORY_CARE_PROVIDER_SITE_OTHER): Payer: Medicare Other | Admitting: Pharmacist Clinician (PhC)/ Clinical Pharmacy Specialist

## 2013-11-15 DIAGNOSIS — Z7901 Long term (current) use of anticoagulants: Secondary | ICD-10-CM

## 2013-11-20 ENCOUNTER — Ambulatory Visit: Payer: Medicare Other | Admitting: Emergency Medicine

## 2013-11-27 ENCOUNTER — Encounter: Payer: Medicare Other | Admitting: *Deleted

## 2013-11-27 ENCOUNTER — Telehealth: Payer: Self-pay | Admitting: Cardiology

## 2013-11-27 NOTE — Telephone Encounter (Signed)
Attempted to call pt and confirm remote transmission. No answer and was unable to leave a message.

## 2013-11-28 LAB — POCT INR: INR: 1.6

## 2013-11-29 ENCOUNTER — Encounter: Payer: Self-pay | Admitting: Cardiology

## 2013-11-30 ENCOUNTER — Ambulatory Visit (INDEPENDENT_AMBULATORY_CARE_PROVIDER_SITE_OTHER): Payer: Medicare Other | Admitting: Pharmacist Clinician (PhC)/ Clinical Pharmacy Specialist

## 2013-11-30 DIAGNOSIS — Z7901 Long term (current) use of anticoagulants: Secondary | ICD-10-CM

## 2013-12-11 ENCOUNTER — Ambulatory Visit (INDEPENDENT_AMBULATORY_CARE_PROVIDER_SITE_OTHER): Payer: Medicare Other | Admitting: *Deleted

## 2013-12-11 DIAGNOSIS — Z950 Presence of cardiac pacemaker: Secondary | ICD-10-CM

## 2013-12-11 NOTE — Progress Notes (Signed)
Remote pacemaker transmission.   

## 2013-12-12 LAB — POCT INR: INR: 1.8

## 2013-12-12 LAB — PROTIME-INR: INR: 1.8 — AB (ref 0.9–1.1)

## 2013-12-14 ENCOUNTER — Ambulatory Visit (INDEPENDENT_AMBULATORY_CARE_PROVIDER_SITE_OTHER): Payer: Medicare Other | Admitting: Pharmacist Clinician (PhC)/ Clinical Pharmacy Specialist

## 2013-12-14 DIAGNOSIS — Z7901 Long term (current) use of anticoagulants: Secondary | ICD-10-CM

## 2013-12-19 ENCOUNTER — Ambulatory Visit (INDEPENDENT_AMBULATORY_CARE_PROVIDER_SITE_OTHER): Payer: Medicare Other | Admitting: Emergency Medicine

## 2013-12-19 ENCOUNTER — Encounter: Payer: Self-pay | Admitting: Emergency Medicine

## 2013-12-19 VITALS — BP 108/48 | HR 90 | Temp 98.1°F | Resp 16 | Ht 65.0 in | Wt 162.8 lb

## 2013-12-19 DIAGNOSIS — I639 Cerebral infarction, unspecified: Secondary | ICD-10-CM

## 2013-12-19 DIAGNOSIS — E0865 Diabetes mellitus due to underlying condition with hyperglycemia: Secondary | ICD-10-CM

## 2013-12-19 DIAGNOSIS — I4891 Unspecified atrial fibrillation: Secondary | ICD-10-CM

## 2013-12-19 DIAGNOSIS — K649 Unspecified hemorrhoids: Secondary | ICD-10-CM

## 2013-12-19 DIAGNOSIS — M5090 Cervical disc disorder, unspecified, unspecified cervical region: Secondary | ICD-10-CM

## 2013-12-19 DIAGNOSIS — Z23 Encounter for immunization: Secondary | ICD-10-CM

## 2013-12-19 DIAGNOSIS — I10 Essential (primary) hypertension: Secondary | ICD-10-CM

## 2013-12-19 LAB — BASIC METABOLIC PANEL WITH GFR
BUN: 20 mg/dL (ref 6–23)
CALCIUM: 9 mg/dL (ref 8.4–10.5)
CO2: 33 mEq/L — ABNORMAL HIGH (ref 19–32)
CREATININE: 1 mg/dL (ref 0.50–1.10)
Chloride: 102 mEq/L (ref 96–112)
GFR, EST NON AFRICAN AMERICAN: 50 mL/min — AB
GFR, Est African American: 58 mL/min — ABNORMAL LOW
Glucose, Bld: 110 mg/dL — ABNORMAL HIGH (ref 70–99)
Potassium: 3.7 mEq/L (ref 3.5–5.3)
Sodium: 141 mEq/L (ref 135–145)

## 2013-12-19 LAB — CBC WITH DIFFERENTIAL/PLATELET
BASOS PCT: 0 % (ref 0–1)
Basophils Absolute: 0 10*3/uL (ref 0.0–0.1)
EOS ABS: 0.2 10*3/uL (ref 0.0–0.7)
Eosinophils Relative: 3 % (ref 0–5)
HCT: 40.7 % (ref 36.0–46.0)
Hemoglobin: 13.4 g/dL (ref 12.0–15.0)
LYMPHS ABS: 1.4 10*3/uL (ref 0.7–4.0)
Lymphocytes Relative: 23 % (ref 12–46)
MCH: 29.3 pg (ref 26.0–34.0)
MCHC: 32.9 g/dL (ref 30.0–36.0)
MCV: 88.9 fL (ref 78.0–100.0)
MPV: 10.7 fL (ref 9.4–12.4)
Monocytes Absolute: 0.6 10*3/uL (ref 0.1–1.0)
Monocytes Relative: 10 % (ref 3–12)
NEUTROS PCT: 64 % (ref 43–77)
Neutro Abs: 3.8 10*3/uL (ref 1.7–7.7)
Platelets: 260 10*3/uL (ref 150–400)
RBC: 4.58 MIL/uL (ref 3.87–5.11)
RDW: 13.1 % (ref 11.5–15.5)
WBC: 5.9 10*3/uL (ref 4.0–10.5)

## 2013-12-19 LAB — POCT GLYCOSYLATED HEMOGLOBIN (HGB A1C): Hemoglobin A1C: 6.5

## 2013-12-19 LAB — GLUCOSE, POCT (MANUAL RESULT ENTRY): POC GLUCOSE: 118 mg/dL — AB (ref 70–99)

## 2013-12-19 MED ORDER — CODEINE SULFATE 15 MG PO TABS: 15.0000 mg | ORAL_TABLET | Freq: Four times a day (QID) | ORAL | Status: DC | PRN

## 2013-12-19 NOTE — Progress Notes (Signed)
Subjective:    Patient ID: [name], female    DOB: Apr 29, 1923, 80 y.o.   MRN: [id number]  HPI Patient with PMH of A. fib and CVA presents for follow up for HTN. Says she has been well in the interval and has been compliant with medications without any side effects. Does not check BP at home.   Has had rectal bleeding that she thinks is coming from her ext. Hemorrhoids. See bright red blood when she wipes. Denies pain, but can be irritated at times. When INR is in range has had drops of blood in toilet and in stool. Had colonoscopy in 2013 with some abnormalities.   Will get cardiologist, Dr. Loletha Grayer, to [facility] digoxin going forward.  INR this morning was 1.8. Managed by coumadin clinic.  Health Maintenance: Flu shot today. Prevnar up to date. Eye appt missed, will reschedule.  Taking mucinex daily for nasal congestion.   Review of Systems  Constitutional: Negative for fever, activity change, appetite change and unexpected weight change.  HENT: Positive for congestion. Negative for sore throat.   Eyes: Negative for visual disturbance.  Respiratory: Negative for shortness of breath.   Cardiovascular: Positive for leg swelling (baseline). Negative for chest pain and palpitations.  Gastrointestinal: Positive for blood in stool. Negative for nausea, vomiting, abdominal pain, diarrhea and constipation.  Genitourinary: Negative.   Neurological: Negative for dizziness, weakness, light-headedness, numbness and headaches.       Objective:   Physical Exam  Constitutional: She is oriented to person, place, and time. She appears well-developed and well-nourished. No distress.  Blood pressure 108/48, pulse 43, temperature 98.1 F (36.7 C), temperature source Oral, resp. rate 16, height 5\' 5"  (1.651 m), weight 162 lb 12.8 oz (73.846 kg), SpO2 95 %.  Recheck pulse 90  HENT:  Head: Normocephalic and atraumatic.  Right Ear: External ear normal.  Left Ear: External ear normal.  Nose: Nose  normal.  Mouth/Throat: Oropharynx is clear and moist.  Eyes: Conjunctivae are normal. Pupils are equal, round, and reactive to light. Right eye exhibits no discharge. Left eye exhibits no discharge. No scleral icterus.  Neck: Normal range of motion. Neck supple. No JVD present. No thyromegaly present.  Cardiovascular: Normal rate and intact distal pulses.  Exam reveals no gallop and no friction rub.   Murmur heard. Pulmonary/Chest: Effort normal and breath sounds normal. No stridor. She has no wheezes. She has no rales.  Abdominal: Soft. Bowel sounds are normal. She exhibits no distension and no mass. There is no tenderness. There is no rebound and no guarding.  Genitourinary: Rectal exam shows external hemorrhoid (2 non inflammed at the 6 and 9:00 positions, 1 inflammed, raw hemorrhoid in 2:00 position) and internal hemorrhoid (2 irritated, raw hemorrhoids in the 3 and 4:00). Rectal exam shows no fissure, no mass and no tenderness.  Musculoskeletal: She exhibits edema. She exhibits no tenderness.  Lymphadenopathy:    She has no cervical adenopathy.  Neurological: She is alert and oriented to person, place, and time. She has normal reflexes.  Skin: Skin is warm and dry. No rash noted. She is not diaphoretic. No erythema. No pallor.    Results for orders placed or performed in visit on 12/19/13  POCT glucose (manual entry)  Result Value Ref Range   POC Glucose 118 (A) 70 - 99 mg/dl  POCT glycosylated hemoglobin (Hb A1C)  Result Value Ref Range   Hemoglobin A1C 6.5        Assessment & Plan:  1.  Essential hypertension Controlled with medication. - CBC with Differential - BASIC METABOLIC PANEL WITH GFR - Digoxin level  2. Diabetes mellitus due to underlying condition with hyperglycemia No medication at this time. Discussed diet options. - BASIC METABOLIC PANEL WITH GFR - POCT glucose (manual entry) - POCT glycosylated hemoglobin (Hb A1C)  3. Atrial fibrillation,  unspecified Followed by cardiologist. Will get cardio to take over prescribing [place]. - Digoxin level  4. Flu vaccine need - Flu Vaccine QUAD 36+ mos IM  5. Disc disorder of cervical region - Codeine prn.  6. Hemorrhoids, ext and int - Patient has cream for when pain is present. Does not need at this time.  - CBC with Diff to check for anemia secondary to hemorrhoidal bleeding.    Alveta Heimlich PA-C  [facility] 12/19/2013 3:40 PM

## 2013-12-20 LAB — MDC_IDC_ENUM_SESS_TYPE_REMOTE
Battery Impedance: 6998 Ohm
Battery Remaining Longevity: 1 mo
Battery Voltage: 2.61 V
Date Time Interrogation Session: 20151116012237
Lead Channel Impedance Value: 466 Ohm
MDC IDC MSMT LEADCHNL RA IMPEDANCE VALUE: 67 Ohm
MDC IDC SET LEADCHNL RV PACING AMPLITUDE: 2.5 V
MDC IDC SET LEADCHNL RV PACING PULSEWIDTH: 0.4 ms
MDC IDC SET LEADCHNL RV SENSING SENSITIVITY: 1 mV
MDC IDC STAT BRADY RV PERCENT PACED: 93 %

## 2013-12-20 LAB — DIGOXIN LEVEL: Digoxin Level: 0.6 ng/mL — ABNORMAL LOW (ref 0.8–2.0)

## 2013-12-26 LAB — POCT INR: INR: 1.8

## 2013-12-27 ENCOUNTER — Ambulatory Visit (INDEPENDENT_AMBULATORY_CARE_PROVIDER_SITE_OTHER): Payer: Medicare Other | Admitting: Pharmacist Clinician (PhC)/ Clinical Pharmacy Specialist

## 2013-12-27 DIAGNOSIS — I4891 Unspecified atrial fibrillation: Secondary | ICD-10-CM

## 2013-12-27 DIAGNOSIS — Z7901 Long term (current) use of anticoagulants: Secondary | ICD-10-CM

## 2013-12-28 ENCOUNTER — Encounter: Payer: Self-pay | Admitting: Cardiovascular Disease

## 2013-12-28 ENCOUNTER — Telehealth: Payer: Self-pay | Admitting: Cardiovascular Disease

## 2013-12-28 ENCOUNTER — Ambulatory Visit (INDEPENDENT_AMBULATORY_CARE_PROVIDER_SITE_OTHER): Payer: Medicare Other | Admitting: Cardiovascular Disease

## 2013-12-28 VITALS — BP 136/86 | HR 112 | Resp 16 | Ht 65.0 in | Wt 164.3 lb

## 2013-12-28 DIAGNOSIS — I10 Essential (primary) hypertension: Secondary | ICD-10-CM

## 2013-12-28 DIAGNOSIS — Z45010 Encounter for checking and testing of cardiac pacemaker pulse generator [battery]: Secondary | ICD-10-CM

## 2013-12-28 DIAGNOSIS — I4891 Unspecified atrial fibrillation: Secondary | ICD-10-CM

## 2013-12-28 DIAGNOSIS — Z7189 Other specified counseling: Secondary | ICD-10-CM | POA: Insufficient documentation

## 2013-12-28 DIAGNOSIS — Z950 Presence of cardiac pacemaker: Secondary | ICD-10-CM

## 2013-12-28 DIAGNOSIS — I639 Cerebral infarction, unspecified: Secondary | ICD-10-CM

## 2013-12-28 LAB — MDC_IDC_ENUM_SESS_TYPE_INCLINIC
Battery Impedance: 7498 Ohm
Lead Channel Impedance Value: 458 Ohm
Lead Channel Sensing Intrinsic Amplitude: 2 mV
Lead Channel Setting Pacing Amplitude: 2.5 V
Lead Channel Setting Pacing Pulse Width: 0.4 ms
Lead Channel Setting Sensing Sensitivity: 1 mV
MDC IDC MSMT BATTERY REMAINING LONGEVITY: 0 mo
MDC IDC MSMT BATTERY VOLTAGE: 2.6 V
MDC IDC MSMT LEADCHNL RA IMPEDANCE VALUE: 67 Ohm
MDC IDC SESS DTM: 20151203121327
MDC IDC STAT BRADY RV PERCENT PACED: 93 %

## 2013-12-28 LAB — COMPREHENSIVE METABOLIC PANEL
ALBUMIN: 3.7 g/dL (ref 3.5–5.2)
ALK PHOS: 64 U/L (ref 39–117)
ALT: 16 U/L (ref 0–35)
AST: 22 U/L (ref 0–37)
BUN: 16 mg/dL (ref 6–23)
CALCIUM: 9.6 mg/dL (ref 8.4–10.5)
CHLORIDE: 102 meq/L (ref 96–112)
CO2: 31 mEq/L (ref 19–32)
Creat: 0.87 mg/dL (ref 0.50–1.10)
Glucose, Bld: 103 mg/dL — ABNORMAL HIGH (ref 70–99)
POTASSIUM: 4 meq/L (ref 3.5–5.3)
Sodium: 142 mEq/L (ref 135–145)
Total Bilirubin: 0.5 mg/dL (ref 0.2–1.2)
Total Protein: 6.7 g/dL (ref 6.0–8.3)

## 2013-12-28 LAB — CBC
HCT: 40.1 % (ref 36.0–46.0)
HEMOGLOBIN: 13.8 g/dL (ref 12.0–15.0)
MCH: 29.6 pg (ref 26.0–34.0)
MCHC: 34.4 g/dL (ref 30.0–36.0)
MCV: 85.9 fL (ref 78.0–100.0)
MPV: 10.1 fL (ref 9.4–12.4)
PLATELETS: 277 10*3/uL (ref 150–400)
RBC: 4.67 MIL/uL (ref 3.87–5.11)
RDW: 13.5 % (ref 11.5–15.5)
WBC: 5.6 10*3/uL (ref 4.0–10.5)

## 2013-12-28 LAB — TSH: TSH: 2.154 u[IU]/mL (ref 0.350–4.500)

## 2013-12-28 NOTE — Telephone Encounter (Signed)
Returning your call from Tuesday.

## 2013-12-28 NOTE — Progress Notes (Signed)
Patient ID: [name], female   DOB: [birth year], 80 y.o.   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 She has permanent atrial fibrillation and is on warfarin anticoagulation. She had a presumed embolic stroke in May of 2013 when her anticoagulation was slightly subtherapeutic at 1.7. She has also had a history of gastrointestinal bleeding from arteriovenous malformations when super therapeutically anticoagulated. She is currently on warfarin without any recent bleeding events.  A pericardial effusion was initially identified in 2012 and described as trivial, it was small to moderate on the echocardiogram performed in 07/02/2011 and probably unchanged but described as moderate on the echocardiogram performed 07/28/2011, moderate to large in August 2014. It is echo free and free flowing and again there are no signs of tamponade. It is noteworthy that a CT of the chest performed last year described a more generalized syndrome of fluid overload with pleural effusions and small amount of ascites.   She presents today for remote device check showed that her pacemaker has reached ERI.  The device was functioning normally had a recent check, with the exception of chronically low R wave sensing (2.0-2.8 mV). She is currently pacing the ventricle 93% of the time. At her device check recently she had roughly 60% ventricular pacing at 40% ventricular sensed rhythm. Only one episode of brief high ventricular rate at 192 beats per minute was recorded on September 18, for only 6 seconds. Interestingly, today she has ventricular sensed rhythm at 112 bpm.   She remains active, living independently and taking care of her household. She walks with great caution and has not had any falls or injuries. She has occasional ankle swelling that usually resolves by the morning. It is  consistently worse in her left ankle. She has chronic shortness of breath, NYHA functional class II. Increasing her dose of torsemide as it led to a slightly lower weight and she has not complained as much of shortness of breath when lying flat. She has had no new gastrointestinal bleeding or other abnormal bleeding. She denies syncope or palpitations.   Allergies  Allergen Reactions  . Iodinated Diagnostic Agents Hives    HIVES 15MIN S/P IV CONTRAST INJECTION,WILL NEED 13 HR PREP FOR FUTURE INJECTIONS, ok s/p 14m po benadryl//a.calhoun  . Anti-Inflammatory Enzyme [Nutritional Supplements]     Retains fluids and headaches  . Arthrotec [Diclofenac-Misoprostol] Other (See Comments)    unknown  . Biaxin [Clarithromycin] Other (See Comments)    unknown  . Plavix [Clopidogrel Bisulfate] Other (See Comments)    unknown  . Spironolactone     Hair loss    Current Outpatient Prescriptions  Medication Sig Dispense Refill  . Cholecalciferol (VITAMIN D-3) 1000 UNITS CAPS Take 2,000 Units by mouth daily.    . codeine 15 MG tablet Take 1 tablet (15 mg total) by mouth every 8 (eight) hours as needed for pain. 60 tablet 0  . codeine 15 MG tablet Take 1 tablet (15 mg total) by mouth every 6 (six) hours as needed. 30 tablet 0  . DIGITEK 125 MCG tablet TAKE 1 TABLET(S) BY MOUTH DAILY 30 tablet 8  . multivitamin (THERAGRAN) per tablet Take 1 tablet by mouth daily.      .Marland KitchenOVER THE COUNTER MEDICATION OTC Vitamin B 1 taking    . potassium chloride (K-DUR,KLOR-CON) 10 MEQ tablet Take 10  mEq by mouth daily. When you take Torsemide.    . torsemide (DEMADEX) 20 MG tablet Take 1.5 tablets (30 mg total) by mouth daily. 45 tablet 3  . verapamil (CALAN-SR) 180 MG CR tablet take 1 tablet by mouth EVERY NIGHT AT BEDTIME 30 tablet 4  . warfarin (COUMADIN) 5 MG tablet Take one tablet daily or as directed per INR. 90 tablet 3   No current facility-administered medications for this visit.    Past Medical History    Diagnosis Date  . Pericardial effusion     Chronic. Last echo in April of 2012.   . Atrial fibrillation     Chronic  . Chronic anticoagulation     on Coumadin  . History of GI bleed     once treated with Fe infusion  . HTN (hypertension)   . Pacemaker     placed for tachybrady.   . Valvular heart disease   . Obesity   . LVH (left ventricular hypertrophy)   . Stroke April 2013    right frontal  . Thrombophlebitis 1967    associated with pregnancy  . Diverticulosis   . Heart murmur   . Anemia   . Blood transfusion without reported diagnosis   . Osteoporosis   . Cataract   . Hx of echocardiogram 07/28/2011    EF>55% small to moderate and is mostly located posteriorly. there is no overt evidence of tamponade although subtle diastolic collapse of the right ventricle.  . Sleep apnea     mild-no cpap  . Arthritis     Past Surgical History  Procedure Laterality Date  . Pacemaker insertion  2001    Last generator in 2006; interrogated Dec 2012  . Breast lumpectomy      bilaterally; negative for cancer  . Tonsillectomy  1950  . Adenoidectomy    . Esophagogastroduodenoscopy  05/19/2011    Procedure: ESOPHAGOGASTRODUODENOSCOPY (EGD);  Surgeon: Lear Ng, MD;  Location: [facility] ENDOSCOPY;  Service: Endoscopy;  Laterality: N/A;  doctor aware of inr   will try to be here no later than 230  . Cholecystectomy  2005  . Esophagogastroduodenoscopy  06/22/2011    Procedure: ESOPHAGOGASTRODUODENOSCOPY (EGD);  Surgeon: Arta Silence, MD;  Location: [facility] ENDOSCOPY;  Service: Endoscopy;  Laterality: N/A;  Check PT/INR in am  . Givens capsule study  06/23/2011    Procedure: GIVENS CAPSULE STUDY;  Surgeon: Arta Silence, MD;  Location: [facility] ENDOSCOPY;  Service: Endoscopy;  Laterality: N/A;  . Breast lumpectomy      left breast  . Colonoscopy  08/11/2011    Procedure: COLONOSCOPY;  Surgeon: Jeryl Columbia, MD;  Location: [facility] ENDOSCOPY;  Service: Endoscopy;  Laterality: N/A;  . Hot hemostasis   08/11/2011    Procedure: HOT HEMOSTASIS (ARGON PLASMA COAGULATION/BICAP);  Surgeon: Jeryl Columbia, MD;  Location: [facility] ENDOSCOPY;  Service: Endoscopy;  Laterality: N/A;  . Mass excision Left 09/14/2013    Procedure: EXCISION MASS LEFT WRIST;  Surgeon: Leanora Cover, MD;  Location: [facility];  Service: Orthopedics;  Laterality: Left;    Family History  Problem Relation Age of Onset  . Stroke Mother   . Stroke Father   . Pneumonia Father     History   Social History  . Marital Status: Widowed    Spouse Name: N/A    Number of Children: N/A  . Years of Education: N/A   Occupational History  . Not on file.   Social History Main Topics  . Smoking  status: Never Smoker   . Smokeless tobacco: Never Used  . Alcohol Use: Yes     Comment: rare  . Drug Use: No  . Sexual Activity: No   Other Topics Concern  . Not on file   Social History Narrative    Review of systems: The patient specifically denies any chest pain at rest or with exertion, dyspnea at rest or with exertion, orthopnea, paroxysmal nocturnal dyspnea, syncope, palpitations, focal neurological deficits, intermittent claudication, lower extremity edema, unexplained weight gain, cough, hemoptysis or wheezing.  The patient also denies abdominal pain, nausea, vomiting, dysphagia, diarrhea, constipation, polyuria, polydipsia, dysuria, hematuria, frequency, urgency, abnormal bleeding or bruising, fever, chills, unexpected weight changes, mood swings, change in skin or hair texture, change in voice quality, auditory or visual problems, allergic reactions or rashes, new musculoskeletal complaints other than usual "aches and pains".   PHYSICAL EXAM BP 136/86 mmHg  Pulse 112  Resp 16  Ht _0  (1.651 m)  Wt 164 lb 4.8 oz (74.526 kg)  BMI 27.34 kg/m2 General: Alert, oriented x3, no distress  Head: no evidence of trauma, PERRL, EOMI, no exophtalmos or lid lag, no myxedema, no xanthelasma; normal ears, nose and  oropharynx  Neck: normal jugular venous pulsations and no hepatojugular reflux; brisk carotid pulses without delay and no carotid bruits  Chest: clear to auscultation, no signs of consolidation by percussion or palpation, normal fremitus, symmetrical and full respiratory excursions  Cardiovascular: normal position and quality of the apical impulse, irregular rhythm, normal first and second heart sounds, no murmurs, rubs or gallomcrross ps  Abdomen: no tenderness or distention, no masses by palpation, no abnormal pulsatility or arterial bruits, normal bowel sounds, no hepatosplenomegaly  Extremities: no clubbing, cyanosis or edema; 2+ radial, ulnar and brachial pulses bilaterally; 2+ right femoral, posterior tibial and dorsalis pedis pulses; 2+ left femoral, posterior tibial and dorsalis pedis pulses; no subclavian or femoral bruits  Neurological: There is very mild left facial droop and slightly weaker grip on the left side  EKG: Atrial fibrillation with rapid ventricular response, diffuse ST and T-wave abnormality suggestive of digoxin effect  Lipid Panel     Component Value Date/Time   CHOL 160 08/03/2013 1155   TRIG 114 08/03/2013 1155   HDL 37* 08/03/2013 1155   CHOLHDL 4.3 08/03/2013 1155   VLDL 23 08/03/2013 1155   LDLCALC 100* 08/03/2013 1155    BMET    Component Value Date/Time   NA 141 12/19/2013 1509   K 3.7 12/19/2013 1509   CL 102 12/19/2013 1509   CO2 33* 12/19/2013 1509   GLUCOSE 110* 12/19/2013 1509   BUN 20 12/19/2013 1509   CREATININE 1.00 12/19/2013 1509   CREATININE 1.03 09/13/2013 1100   CALCIUM 9.0 12/19/2013 1509   GFRNONAA 50* 12/19/2013 1509   GFRNONAA 47* 09/13/2013 1100   GFRAA 58* 12/19/2013 1509   GFRAA 55* 09/13/2013 1100     ASSESSMENT AND PLAN Atrial fibrillation  Stopping the verapamil would probably allow for less ventricular pacing, but she tells me that when she missed a few doses she had very unpleasant palpitations and does not  think she can come off this medication. She should remain on full dose anticoagulation barring serious bleeding complications, since she has had a stroke when subtherapeutically anticoagulated in the past. We'll plan pacemaker revision while on anticoagulant  Pacemaker at [facility] implanted 2006. Dual chamber device now programmed VVIR for permanent atrial fibrillation  Chronic poor sensing on the ventricular channel with  R waves of approximately  2 mV.  Discussed the need for a new ventricular lead. She is elderly and thin and therefore at increased risk of mechanical complications. We'll plan a right upper extremity venogram before making a final decision. If the right subclavian vein is still open will plan to drop a new right ventricular lead, probably a passive fixation device. If the right subclavian vein is occluded, will probably decide to just do a pacemaker generator change with the option of implanting an entirely new system at a future date if right ventricular sensing deteriorates further. She is not pacemaker dependent.  Pericardial effusion, large  Etiology unknown. This was present before pacemaker implantation. Clinically there are no signs of tamponade.  Discussed performing a pericardiocentesis for diagnostic purposes, at the time of her pacemaker system revision. Her repeat echocardiogram has not yet been performed. I think we'll leave this for another time.  Orders Placed This Encounter  Procedures  . Comp Met (CMET)  . TSH  . CBC  . INR/PT  . PTT  . EKG 12-Lead  . PACEMAKER GENERATOR CHANGE  . VENOGRAM   No orders of the defined types were placed in this encounter.    Holli Humbles, MD, [place] [phone] office [phone] pager

## 2013-12-28 NOTE — Patient Instructions (Addendum)
Your physician has recommended that you have a PACEMAKER GENERATOR CHANGE OUT W/NEW RV LEAD/VENOGRAM on 01-02-2014. A pacemaker is a small device that is placed under the skin of your chest or abdomen to help control abnormal heart rhythms. This device uses electrical pulses to prompt the heart to beat at a normal rate. Pacemakers are used to treat heart rhythms that are too slow. Wire (leads) are attached to the pacemaker that goes into the chambers of you heart. This is done in the hospital and usually requires and overnight stay. Please see the instruction sheet given to you today for more information.  Your physician recommends that you return for lab work TODAY.

## 2013-12-29 ENCOUNTER — Encounter: Payer: Self-pay | Admitting: Cardiovascular Disease

## 2013-12-29 LAB — PROTIME-INR
INR: 1.8 — ABNORMAL HIGH (ref <1.50)
Prothrombin Time: 20.9 seconds — ABNORMAL HIGH (ref 11.6–15.2)

## 2013-12-29 LAB — APTT: APTT: 39 s — AB (ref 24–37)

## 2013-12-31 ENCOUNTER — Other Ambulatory Visit: Payer: Self-pay | Admitting: Cardiovascular Disease

## 2014-01-01 ENCOUNTER — Other Ambulatory Visit: Payer: Self-pay | Admitting: *Deleted

## 2014-01-01 DIAGNOSIS — I10 Essential (primary) hypertension: Secondary | ICD-10-CM | POA: Diagnosis not present

## 2014-01-01 DIAGNOSIS — Z45018 Encounter for adjustment and management of other part of cardiac pacemaker: Secondary | ICD-10-CM | POA: Diagnosis not present

## 2014-01-01 DIAGNOSIS — I313 Pericardial effusion (noninflammatory): Secondary | ICD-10-CM | POA: Diagnosis not present

## 2014-01-01 DIAGNOSIS — Z7982 Long term (current) use of aspirin: Secondary | ICD-10-CM | POA: Diagnosis not present

## 2014-01-01 DIAGNOSIS — Z6826 Body mass index (BMI) 26.0-26.9, adult: Secondary | ICD-10-CM | POA: Diagnosis not present

## 2014-01-01 DIAGNOSIS — Z8673 Personal history of transient ischemic attack (TIA), and cerebral infarction without residual deficits: Secondary | ICD-10-CM | POA: Diagnosis not present

## 2014-01-01 DIAGNOSIS — E669 Obesity, unspecified: Secondary | ICD-10-CM | POA: Diagnosis not present

## 2014-01-01 DIAGNOSIS — Z7901 Long term (current) use of anticoagulants: Secondary | ICD-10-CM | POA: Diagnosis not present

## 2014-01-01 DIAGNOSIS — I482 Chronic atrial fibrillation: Secondary | ICD-10-CM | POA: Diagnosis not present

## 2014-01-01 DIAGNOSIS — Z45010 Encounter for checking and testing of cardiac pacemaker pulse generator [battery]: Secondary | ICD-10-CM

## 2014-01-01 DIAGNOSIS — I495 Sick sinus syndrome: Secondary | ICD-10-CM | POA: Diagnosis not present

## 2014-01-01 MED ORDER — SODIUM CHLORIDE 0.9 % IV SOLN: INTRAVENOUS | Status: DC

## 2014-01-01 MED ORDER — CEFAZOLIN SODIUM-DEXTROSE 2-3 GM-% IV SOLR: 2.0000 g | INTRAVENOUS | Status: DC

## 2014-01-01 MED ORDER — SODIUM CHLORIDE 0.9 % IR SOLN
80.0000 mg | Status: DC
  Filled 2014-01-01: qty 2

## 2014-01-01 NOTE — Telephone Encounter (Signed)
Rx has been sent to the pharmacy electronically. ° °

## 2014-01-02 ENCOUNTER — Ambulatory Visit (HOSPITAL_COMMUNITY)
Admission: RE | Admit: 2014-01-02 | Discharge: 2014-01-03 | Disposition: A | Discharge status: Home / Self Care | Payer: Medicare Other | Source: Ambulatory Visit | Attending: Cardiovascular Disease | Admitting: Cardiovascular Disease

## 2014-01-02 ENCOUNTER — Encounter (HOSPITAL_COMMUNITY): Payer: Self-pay | Admitting: General Practice

## 2014-01-02 ENCOUNTER — Encounter (HOSPITAL_COMMUNITY)
Admission: RE | Disposition: A | Discharge status: Home / Self Care | Payer: BLUE CROSS/BLUE SHIELD | Source: Ambulatory Visit | Attending: Cardiovascular Disease

## 2014-01-02 DIAGNOSIS — Z45018 Encounter for adjustment and management of other part of cardiac pacemaker: Secondary | ICD-10-CM | POA: Diagnosis not present

## 2014-01-02 DIAGNOSIS — Z45010 Encounter for checking and testing of cardiac pacemaker pulse generator [battery]: Secondary | ICD-10-CM

## 2014-01-02 DIAGNOSIS — Z950 Presence of cardiac pacemaker: Secondary | ICD-10-CM | POA: Diagnosis present

## 2014-01-02 DIAGNOSIS — Z7189 Other specified counseling: Secondary | ICD-10-CM

## 2014-01-02 DIAGNOSIS — Z8673 Personal history of transient ischemic attack (TIA), and cerebral infarction without residual deficits: Secondary | ICD-10-CM | POA: Insufficient documentation

## 2014-01-02 DIAGNOSIS — Z6826 Body mass index (BMI) 26.0-26.9, adult: Secondary | ICD-10-CM | POA: Insufficient documentation

## 2014-01-02 DIAGNOSIS — I482 Chronic atrial fibrillation: Secondary | ICD-10-CM | POA: Insufficient documentation

## 2014-01-02 DIAGNOSIS — I313 Pericardial effusion (noninflammatory): Secondary | ICD-10-CM | POA: Insufficient documentation

## 2014-01-02 DIAGNOSIS — I10 Essential (primary) hypertension: Secondary | ICD-10-CM | POA: Insufficient documentation

## 2014-01-02 DIAGNOSIS — Z7901 Long term (current) use of anticoagulants: Secondary | ICD-10-CM | POA: Insufficient documentation

## 2014-01-02 DIAGNOSIS — I495 Sick sinus syndrome: Secondary | ICD-10-CM | POA: Diagnosis present

## 2014-01-02 DIAGNOSIS — I4821 Permanent atrial fibrillation: Secondary | ICD-10-CM | POA: Diagnosis present

## 2014-01-02 DIAGNOSIS — I4891 Unspecified atrial fibrillation: Secondary | ICD-10-CM

## 2014-01-02 DIAGNOSIS — Z7982 Long term (current) use of aspirin: Secondary | ICD-10-CM | POA: Insufficient documentation

## 2014-01-02 DIAGNOSIS — E669 Obesity, unspecified: Secondary | ICD-10-CM | POA: Insufficient documentation

## 2014-01-02 HISTORY — DX: Nonspecific reaction to tuberculin skin test without active tuberculosis: R76.11

## 2014-01-02 HISTORY — PX: LEAD REVISION: SHX5945

## 2014-01-02 HISTORY — PX: PACEMAKER GENERATOR CHANGE: SHX5481

## 2014-01-02 HISTORY — DX: Sick sinus syndrome: I49.5

## 2014-01-02 LAB — SURGICAL PCR SCREEN
MRSA, PCR: NEGATIVE
STAPHYLOCOCCUS AUREUS: NEGATIVE

## 2014-01-02 LAB — PROTIME-INR
INR: 2.12 — ABNORMAL HIGH (ref 0.00–1.49)
PROTHROMBIN TIME: 23.9 s — AB (ref 11.6–15.2)

## 2014-01-02 SURGERY — PACEMAKER GENERATOR CHANGE
Anesthesia: LOCAL

## 2014-01-02 MED ORDER — CEFAZOLIN SODIUM 1-5 GM-% IV SOLN
1.0000 g | Freq: Four times a day (QID) | INTRAVENOUS | Status: AC
Start: 2014-01-02 — End: 2014-01-03
  Administered 2014-01-02 – 2014-01-03 (×3): 1 g via INTRAVENOUS
  Filled 2014-01-02 (×3): qty 50

## 2014-01-02 MED ORDER — SODIUM CHLORIDE 0.9 % IV SOLN
INTRAVENOUS | Status: DC
  Administered 2014-01-02: 13:00:00 via INTRAVENOUS

## 2014-01-02 MED ORDER — MUPIROCIN 2 % EX OINT
1.0000 "application " | TOPICAL_OINTMENT | Freq: Once | CUTANEOUS | Status: AC
  Administered 2014-01-02: 1 via TOPICAL
  Filled 2014-01-02: qty 22

## 2014-01-02 MED ORDER — WARFARIN - PHARMACIST DOSING INPATIENT: Freq: Every day | Status: DC

## 2014-01-02 MED ORDER — YOU HAVE A PACEMAKER BOOK
Freq: Once | Status: AC
  Administered 2014-01-02: 22:00:00
  Filled 2014-01-02: qty 1

## 2014-01-02 MED ORDER — LIDOCAINE HCL (PF) 1 % IJ SOLN
INTRAMUSCULAR | Status: AC
  Filled 2014-01-02: qty 60

## 2014-01-02 MED ORDER — VERAPAMIL HCL ER 180 MG PO TBCR
180.0000 mg | EXTENDED_RELEASE_TABLET | Freq: Every day | ORAL | Status: DC
  Filled 2014-01-02 (×2): qty 1

## 2014-01-02 MED ORDER — SODIUM CHLORIDE 0.9 % IV SOLN
INTRAVENOUS | Status: AC
  Administered 2014-01-02: 17:00:00 via INTRAVENOUS

## 2014-01-02 MED ORDER — ACETAMINOPHEN 325 MG PO TABS: 325.0000 mg | ORAL_TABLET | ORAL | Status: DC | PRN

## 2014-01-02 MED ORDER — MUPIROCIN 2 % EX OINT
TOPICAL_OINTMENT | CUTANEOUS | Status: AC
  Administered 2014-01-02: 1 via TOPICAL
  Filled 2014-01-02: qty 22

## 2014-01-02 MED ORDER — CEFAZOLIN SODIUM-DEXTROSE 2-3 GM-% IV SOLR
INTRAVENOUS | Status: AC
  Filled 2014-01-02: qty 50

## 2014-01-02 MED ORDER — SODIUM CHLORIDE 0.9 % IJ SOLN: 3.0000 mL | INTRAMUSCULAR | Status: DC | PRN

## 2014-01-02 MED ORDER — DIGOXIN 125 MCG PO TABS
0.1250 mg | ORAL_TABLET | Freq: Every day | ORAL | Status: DC
  Filled 2014-01-02: qty 1

## 2014-01-02 MED ORDER — HEPARIN (PORCINE) IN NACL 2-0.9 UNIT/ML-% IJ SOLN
INTRAMUSCULAR | Status: AC
  Filled 2014-01-02: qty 1000

## 2014-01-02 MED ORDER — ONDANSETRON HCL 4 MG/2ML IJ SOLN: 4.0000 mg | Freq: Four times a day (QID) | INTRAMUSCULAR | Status: DC | PRN

## 2014-01-02 MED ORDER — CODEINE SULFATE 15 MG PO TABS: 15.0000 mg | ORAL_TABLET | Freq: Four times a day (QID) | ORAL | Status: DC | PRN

## 2014-01-02 MED ORDER — WARFARIN SODIUM 5 MG PO TABS
5.0000 mg | ORAL_TABLET | Freq: Once | ORAL | Status: AC
  Administered 2014-01-02: 5 mg via ORAL
  Filled 2014-01-02: qty 1

## 2014-01-02 NOTE — H&P (View-Only) (Signed)
Patient ID: [name], female   DOB: [birth year], 80 y.o.   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 She has permanent atrial fibrillation and is on warfarin anticoagulation. She had a presumed embolic stroke in May of 2013 when her anticoagulation was slightly subtherapeutic at 1.7. She has also had a history of gastrointestinal bleeding from arteriovenous malformations when super therapeutically anticoagulated. She is currently on warfarin without any recent bleeding events.  A pericardial effusion was initially identified in 2012 and described as trivial, it was small to moderate on the echocardiogram performed in 07/02/2011 and probably unchanged but described as moderate on the echocardiogram performed 07/28/2011, moderate to large in August 2014. It is echo free and free flowing and again there are no signs of tamponade. It is noteworthy that a CT of the chest performed last year described a more generalized syndrome of fluid overload with pleural effusions and small amount of ascites.   She presents today for remote device check showed that her pacemaker has reached ERI.  The device was functioning normally had a recent check, with the exception of chronically low R wave sensing (2.0-2.8 mV). She is currently pacing the ventricle 93% of the time. At her device check recently she had roughly 60% ventricular pacing at 40% ventricular sensed rhythm. Only one episode of brief high ventricular rate at 192 beats per minute was recorded on September 18, for only 6 seconds. Interestingly, today she has ventricular sensed rhythm at 112 bpm.   She remains active, living independently and taking care of her household. She walks with great caution and has not had any falls or injuries. She has occasional ankle swelling that usually resolves by the morning. It is  consistently worse in her left ankle. She has chronic shortness of breath, NYHA functional class II. Increasing her dose of torsemide as it led to a slightly lower weight and she has not complained as much of shortness of breath when lying flat. She has had no new gastrointestinal bleeding or other abnormal bleeding. She denies syncope or palpitations.   Allergies  Allergen Reactions  . Iodinated Diagnostic Agents Hives    HIVES 15MIN S/P IV CONTRAST INJECTION,WILL NEED 13 HR PREP FOR FUTURE INJECTIONS, ok s/p 14m po benadryl//a.calhoun  . Anti-Inflammatory Enzyme [Nutritional Supplements]     Retains fluids and headaches  . Arthrotec [Diclofenac-Misoprostol] Other (See Comments)    unknown  . Biaxin [Clarithromycin] Other (See Comments)    unknown  . Plavix [Clopidogrel Bisulfate] Other (See Comments)    unknown  . Spironolactone     Hair loss    Current Outpatient Prescriptions  Medication Sig Dispense Refill  . Cholecalciferol (VITAMIN D-3) 1000 UNITS CAPS Take 2,000 Units by mouth daily.    . codeine 15 MG tablet Take 1 tablet (15 mg total) by mouth every 8 (eight) hours as needed for pain. 60 tablet 0  . codeine 15 MG tablet Take 1 tablet (15 mg total) by mouth every 6 (six) hours as needed. 30 tablet 0  . DIGITEK 125 MCG tablet TAKE 1 TABLET(S) BY MOUTH DAILY 30 tablet 8  . multivitamin (THERAGRAN) per tablet Take 1 tablet by mouth daily.      .Marland KitchenOVER THE COUNTER MEDICATION OTC Vitamin B 1 taking    . potassium chloride (K-DUR,KLOR-CON) 10 MEQ tablet Take 10  mEq by mouth daily. When you take Torsemide.    . torsemide (DEMADEX) 20 MG tablet Take 1.5 tablets (30 mg total) by mouth daily. 45 tablet 3  . verapamil (CALAN-SR) 180 MG CR tablet take 1 tablet by mouth EVERY NIGHT AT BEDTIME 30 tablet 4  . warfarin (COUMADIN) 5 MG tablet Take one tablet daily or as directed per INR. 90 tablet 3   No current facility-administered medications for this visit.    Past Medical History    Diagnosis Date  . Pericardial effusion     Chronic. Last echo in April of 2012.   . Atrial fibrillation     Chronic  . Chronic anticoagulation     on Coumadin  . History of GI bleed     once treated with Fe infusion  . HTN (hypertension)   . Pacemaker     placed for tachybrady.   . Valvular heart disease   . Obesity   . LVH (left ventricular hypertrophy)   . Stroke April 2013    right frontal  . Thrombophlebitis 1967    associated with pregnancy  . Diverticulosis   . Heart murmur   . Anemia   . Blood transfusion without reported diagnosis   . Osteoporosis   . Cataract   . Hx of echocardiogram 07/28/2011    EF>55% small to moderate and is mostly located posteriorly. there is no overt evidence of tamponade although subtle diastolic collapse of the right ventricle.  . Sleep apnea     mild-no cpap  . Arthritis     Past Surgical History  Procedure Laterality Date  . Pacemaker insertion  2001    Last generator in 2006; interrogated Dec 2012  . Breast lumpectomy      bilaterally; negative for cancer  . Tonsillectomy  1950  . Adenoidectomy    . Esophagogastroduodenoscopy  05/19/2011    Procedure: ESOPHAGOGASTRODUODENOSCOPY (EGD);  Surgeon: Lear Ng, MD;  Location: [facility] ENDOSCOPY;  Service: Endoscopy;  Laterality: N/A;  doctor aware of inr   will try to be here no later than 230  . Cholecystectomy  2005  . Esophagogastroduodenoscopy  06/22/2011    Procedure: ESOPHAGOGASTRODUODENOSCOPY (EGD);  Surgeon: Arta Silence, MD;  Location: [facility] ENDOSCOPY;  Service: Endoscopy;  Laterality: N/A;  Check PT/INR in am  . Givens capsule study  06/23/2011    Procedure: GIVENS CAPSULE STUDY;  Surgeon: Arta Silence, MD;  Location: [facility] ENDOSCOPY;  Service: Endoscopy;  Laterality: N/A;  . Breast lumpectomy      left breast  . Colonoscopy  08/11/2011    Procedure: COLONOSCOPY;  Surgeon: Jeryl Columbia, MD;  Location: [facility] ENDOSCOPY;  Service: Endoscopy;  Laterality: N/A;  . Hot hemostasis   08/11/2011    Procedure: HOT HEMOSTASIS (ARGON PLASMA COAGULATION/BICAP);  Surgeon: Jeryl Columbia, MD;  Location: [facility] ENDOSCOPY;  Service: Endoscopy;  Laterality: N/A;  . Mass excision Left 09/14/2013    Procedure: EXCISION MASS LEFT WRIST;  Surgeon: Leanora Cover, MD;  Location: [facility];  Service: Orthopedics;  Laterality: Left;    Family History  Problem Relation Age of Onset  . Stroke Mother   . Stroke Father   . Pneumonia Father     History   Social History  . Marital Status: Widowed    Spouse Name: N/A    Number of Children: N/A  . Years of Education: N/A   Occupational History  . Not on file.   Social History Main Topics  . Smoking  status: Never Smoker   . Smokeless tobacco: Never Used  . Alcohol Use: Yes     Comment: rare  . Drug Use: No  . Sexual Activity: No   Other Topics Concern  . Not on file   Social History Narrative    Review of systems: The patient specifically denies any chest pain at rest or with exertion, dyspnea at rest or with exertion, orthopnea, paroxysmal nocturnal dyspnea, syncope, palpitations, focal neurological deficits, intermittent claudication, lower extremity edema, unexplained weight gain, cough, hemoptysis or wheezing.  The patient also denies abdominal pain, nausea, vomiting, dysphagia, diarrhea, constipation, polyuria, polydipsia, dysuria, hematuria, frequency, urgency, abnormal bleeding or bruising, fever, chills, unexpected weight changes, mood swings, change in skin or hair texture, change in voice quality, auditory or visual problems, allergic reactions or rashes, new musculoskeletal complaints other than usual "aches and pains".   PHYSICAL EXAM BP 136/86 mmHg  Pulse 112  Resp 16  Ht _0  (1.651 m)  Wt 164 lb 4.8 oz (74.526 kg)  BMI 27.34 kg/m2 General: Alert, oriented x3, no distress  Head: no evidence of trauma, PERRL, EOMI, no exophtalmos or lid lag, no myxedema, no xanthelasma; normal ears, nose and  oropharynx  Neck: normal jugular venous pulsations and no hepatojugular reflux; brisk carotid pulses without delay and no carotid bruits  Chest: clear to auscultation, no signs of consolidation by percussion or palpation, normal fremitus, symmetrical and full respiratory excursions  Cardiovascular: normal position and quality of the apical impulse, irregular rhythm, normal first and second heart sounds, no murmurs, rubs or gallomcrross ps  Abdomen: no tenderness or distention, no masses by palpation, no abnormal pulsatility or arterial bruits, normal bowel sounds, no hepatosplenomegaly  Extremities: no clubbing, cyanosis or edema; 2+ radial, ulnar and brachial pulses bilaterally; 2+ right femoral, posterior tibial and dorsalis pedis pulses; 2+ left femoral, posterior tibial and dorsalis pedis pulses; no subclavian or femoral bruits  Neurological: There is very mild left facial droop and slightly weaker grip on the left side  EKG: Atrial fibrillation with rapid ventricular response, diffuse ST and T-wave abnormality suggestive of digoxin effect  Lipid Panel     Component Value Date/Time   CHOL 160 08/03/2013 1155   TRIG 114 08/03/2013 1155   HDL 37* 08/03/2013 1155   CHOLHDL 4.3 08/03/2013 1155   VLDL 23 08/03/2013 1155   LDLCALC 100* 08/03/2013 1155    BMET    Component Value Date/Time   NA 141 12/19/2013 1509   K 3.7 12/19/2013 1509   CL 102 12/19/2013 1509   CO2 33* 12/19/2013 1509   GLUCOSE 110* 12/19/2013 1509   BUN 20 12/19/2013 1509   CREATININE 1.00 12/19/2013 1509   CREATININE 1.03 09/13/2013 1100   CALCIUM 9.0 12/19/2013 1509   GFRNONAA 50* 12/19/2013 1509   GFRNONAA 47* 09/13/2013 1100   GFRAA 58* 12/19/2013 1509   GFRAA 55* 09/13/2013 1100     ASSESSMENT AND PLAN Atrial fibrillation  Stopping the verapamil would probably allow for less ventricular pacing, but she tells me that when she missed a few doses she had very unpleasant palpitations and does not  think she can come off this medication. She should remain on full dose anticoagulation barring serious bleeding complications, since she has had a stroke when subtherapeutically anticoagulated in the past. We'll plan pacemaker revision while on anticoagulant  Pacemaker at [facility] implanted 2006. Dual chamber device now programmed VVIR for permanent atrial fibrillation  Chronic poor sensing on the ventricular channel with  R waves of approximately  2 mV.  Discussed the need for a new ventricular lead. She is elderly and thin and therefore at increased risk of mechanical complications. We'll plan a right upper extremity venogram before making a final decision. If the right subclavian vein is still open will plan to drop a new right ventricular lead, probably a passive fixation device. If the right subclavian vein is occluded, will probably decide to just do a pacemaker generator change with the option of implanting an entirely new system at a future date if right ventricular sensing deteriorates further. She is not pacemaker dependent.  Pericardial effusion, large  Etiology unknown. This was present before pacemaker implantation. Clinically there are no signs of tamponade.  Discussed performing a pericardiocentesis for diagnostic purposes, at the time of her pacemaker system revision. Her repeat echocardiogram has not yet been performed. I think we'll leave this for another time.  Orders Placed This Encounter  Procedures  . Comp Met (CMET)  . TSH  . CBC  . INR/PT  . PTT  . EKG 12-Lead  . PACEMAKER GENERATOR CHANGE  . VENOGRAM   No orders of the defined types were placed in this encounter.    Holli Humbles, MD, [place] [phone] office [phone] pager

## 2014-01-02 NOTE — Interval H&P Note (Signed)
History and Physical Interval Note:  01/02/2014 11:35 AM  [name]  has presented today for surgery, with the diagnosis of End of Life  The various methods of treatment have been discussed with the patient and family. After consideration of risks, benefits and other options for treatment, the patient has consented to  Procedure(s): PACEMAKER GENERATOR CHANGE (N/A) LEAD REVISION (N/A) as a surgical intervention .  The patient's history has been reviewed, patient examined, no change in status, stable for surgery.  I have reviewed the patient's chart and labs.  Questions were answered to the patient's satisfaction.     [name]

## 2014-01-02 NOTE — Op Note (Signed)
Procedure report  Procedure performed:  1. Single chamber generator changeout  2. New right ventricular lead 3. Chronic RA and RV leads capped  Reason for procedure:  1. Device generator at elective replacement interval; poor chronic RV lead sensing 2. Permanent atrial fibrillation with slow ventricular response Procedure performed by:  Sanda Klein, MD  Complications:  None  Estimated blood loss:  <5 mL  Medications administered during procedure:  Ancef 2 g intravenously,  lidocaine 1% 30 mL locally Device details:   [employer] [place]  model number [id number], serial number [id number] H Right ventricular lead (new )  Medtronic, model number [id number], serial number [id number]   Explanted generator Medtronic Adapta [id number], serial number [id number] (implanted 12/15/2004)  Procedure details:  After the risks and benefits of the procedure were discussed the patient provided informed consent. She was brought to the cardiac catheter lab in the fasting state. The patient was prepped and draped in usual sterile fashion. Local anesthesia with 1% lidocaine was administered to to the left infraclavicular area. A 5-6cm horizontal incision was made parallel with and 2-3 cm caudal to the right clavicle, in the area of an old scar. An older scar was seen closer to the right clavicle. Using minimal electrocautery and mostly sharp and blunt dissection the prepectoral pocket was opened carefully to avoid injury to the loops of chronic leads. Extensive dissection was not necessary. The device was explanted. The pocket was carefully inspected for hemostasis and flushed with copious amounts of antibiotic solution.  The leads were disconnected from the old generator and capped.   Under fluoroscopic guidance and using the modified Seldinger technique a single venipuncture was performed to access the left subclavian vein without difficulty. Considerable difficulty was encountered passing the guidewire  past the chronic leads and a Glidewire was used.and subsequently exchanged for a 7 [place] safe sheath.  Under fluoroscopic guidance the ventricular lead was advanced to level of the mid to apical right ventricular septum and thet active-fixation helix was deployed. Prominent current of injury was seen. Satisfactory pacing and sensing parameters were recorded. There was no evidence of diaphragmatic stimulation at maximum device output. The safe sheath was peeled away and the lead was secured in place with 2-0 silk.  The antibiotic-soaked sponge was removed from the pocket. The pocket was flushed with copious amounts of antibiotic solution. Reinspection showed excellent hemostasis..  The ventricular lead was connected to the generator and appropriate ventricular pacing was seen. Subsequently the atrial lead was also connected. Repeat testing of the lead parameters later showed excellent values.  The entire system was then carefully inserted in the pocket with care been taking that the leads and device assumed a comfortable position without pressure on the incision. Great care was taken that the leads be located deep to the generator. The pocket was then closed in layers using 2 layers of 2-0 Vicryl and cutaneous staples after which a sterile dressing was applied.   At the end of the procedure the following lead parameters were encountered:   Right ventricular lead sensed R waves  11.2-15.68 mV, impedance 818hms, threshold 0.5 0.51ms pulse width.  Sanda Klein, MD, [facility] [phone] office [phone] pager   Cc:

## 2014-01-02 NOTE — Discharge Instructions (Signed)
Supplemental Discharge Instructions for  Pacemaker/Defibrillator Patients  Activity Do not raise your left/right arm above shoulder level or extend it backward beyond shoulder level for 2 weeks. Wear the arm sling as a reminder or as needed for comfort for 2 weeks. No heavy lifting or vigorous activity with your left/right arm for 6-8 weeks.    NO DRIVING is preferable for 2 weeks; If absolutely necessary, drive only short, familiar routes. DO wear your seatbelt, even if it crosses over the pacemaker site.  WOUND CARE Keep the wound area clean and dry.  Remove the dressing the day after you return home (usually 48 hours after the procedure). DO NOT SUBMERGE UNDER WATER UNTIL FULLY HEALED (no tub baths, hot tubs, swimming pools, etc.).  You  may shower or take a sponge bath after the dressing is removed. DO NOT SOAK the area and do not allow the shower to directly spray on the site. If you have staples, these will be removed in the office in 7-14 days. If you have tape/steri-strips on your wound, these will fall off; do not pull them off prematurely.   No bandage is needed on the site.  DO  NOT apply any creams, oils, or ointments to the wound area. If you notice any drainage or discharge from the wound, any swelling, excessive redness or bruising at the site, or if you develop a fever > 101? F after you are discharged home, call the office at once.  Special Instructions You are still able to use cellular telephones.  Avoid carrying your cellular phone near your device. When traveling through airports, show security personnel your identification card to avoid being screened in the metal detectors.  Avoid arc welding equipment, MRI testing (magnetic resonance imaging), TENS units (transcutaneous nerve stimulators).  Call the office for questions about other devices. Avoid electrical appliances that are in poor condition or are not properly grounded. Microwave ovens are safe to be near or to  operate.   

## 2014-01-02 NOTE — Progress Notes (Signed)
Plan DC in AM after overnight observation if device check, wound inspection and CXR are favorable. Wound check/staple removal in 7-10 days. Office visit for device reprogramming in 4-6 weeks. Continue warfarin uninterrupted, target INR 2-2.5.

## 2014-01-02 NOTE — Progress Notes (Signed)
ANTICOAGULATION CONSULT NOTE - Initial Consult  Pharmacy Consult for Warfarin Indication: atrial fibrillation  Allergies  Allergen Reactions  . Iodinated Diagnostic Agents Hives    HIVES 15MIN S/P IV CONTRAST INJECTION,WILL NEED 13 HR PREP FOR FUTURE INJECTIONS, ok s/p 50mg  po benadryl//a.calhoun  . Anti-Inflammatory Enzyme [Nutritional Supplements]     Retains fluids and headaches  . Arthrotec [Diclofenac-Misoprostol] Other (See Comments)    unknown  . Biaxin [Clarithromycin] Other (See Comments)    unknown  . Plavix [Clopidogrel Bisulfate] Other (See Comments)    unknown  . Spironolactone     Hair loss    Patient Measurements: Height: 5\' 5"  (165.1 cm) Weight: 164 lb (74.39 kg) IBW/kg (Calculated) : 57  Vital Signs: Temp: 97.5 F (36.4 C) (12/08 1118) Temp Source: Oral (12/08 1118) BP: 109/61 mmHg (12/08 1623) Pulse Rate: 61 (12/08 1623)  Labs:  Recent Labs  01/02/14 1221  LABPROT 23.9*  INR 2.12*    Estimated Creatinine Clearance: 45.2 mL/min (by C-G formula based on Cr of 0.87).   Medical History: Past Medical History  Diagnosis Date  . Pericardial effusion     Chronic. Last echo in April of 2012.   . Atrial fibrillation     Chronic  . Chronic anticoagulation     on Coumadin  . History of GI bleed     once treated with Fe infusion  . HTN (hypertension)   . Pacemaker     placed for tachybrady.   . Valvular heart disease   . Obesity   . LVH (left ventricular hypertrophy)   . Stroke April 2013    right frontal  . Thrombophlebitis 1967    associated with pregnancy  . Diverticulosis   . Heart murmur   . Anemia   . Blood transfusion without reported diagnosis   . Osteoporosis   . Cataract   . Hx of echocardiogram 07/28/2011    EF>55% small to moderate and is mostly located posteriorly. there is no overt evidence of tamponade although subtle diastolic collapse of the right ventricle.  . Sleep apnea     mild-no cpap  . Arthritis      Medications:  Prescriptions prior to admission  Medication Sig Dispense Refill Last Dose  . Cholecalciferol (VITAMIN D-3) 1000 UNITS CAPS Take 2,000 Units by mouth daily.   01/01/2014 at Unknown time  . DIGITEK 125 MCG tablet TAKE 1 TABLET(S) BY MOUTH DAILY 30 tablet 8 01/02/2014 at 0930  . multivitamin (THERAGRAN) per tablet Take 1 tablet by mouth daily.     01/01/2014 at Unknown time  . OVER THE COUNTER MEDICATION OTC Vitamin B 1 taking   01/01/2014 at Unknown time  . potassium chloride (K-DUR,KLOR-CON) 10 MEQ tablet take 2 tablets by mouth once daily WHEN YOU TAKE TORSEMIDE 180 tablet 1 Past Week at Unknown time  . torsemide (DEMADEX) 20 MG tablet Take 1.5 tablets (30 mg total) by mouth daily. 45 tablet 3 01/01/2014 at Unknown time  . verapamil (CALAN-SR) 180 MG CR tablet take 1 tablet by mouth EVERY NIGHT AT BEDTIME 30 tablet 4 01/02/2014 at 0930  . warfarin (COUMADIN) 5 MG tablet Take one tablet daily or as directed per INR. 90 tablet 3 01/01/2014 at 2130  . codeine 15 MG tablet Take 1 tablet (15 mg total) by mouth every 8 (eight) hours as needed for pain. 60 tablet 0 More than a month at Unknown time  . codeine 15 MG tablet Take 1 tablet (15 mg total) by mouth every  6 (six) hours as needed. 30 tablet 0 More than a month at Unknown time   Scheduled:  . ceFAZolin      .  ceFAZolin (ANCEF) IV  1 g Intravenous Q6H  . [START ON 01/03/2014] digoxin  0.125 mg Oral Daily  . verapamil  180 mg Oral QHS    Assessment: 80yo female presented for pacemaker generator change. Pharmacy is consulted to dose warfarin for atrial fibrillation at a tighter INR goal of 2-2.5. Pt reports that she takes warfarin 2.5mg  on Mondays and Fridays and 5mg  AODs. INR on admission is therapeutic at 2.12, Hgb and Plt wnl. Pt reports no abnormal signs of bleeding. The cardiology note expects an AM discharge.  Goal of Therapy:  INR 2-2.5 Monitor platelets by anticoagulation protocol: Yes   Plan:  Warfarin 5mg  PO tonight  x1 Daily INR Continue to monitor H&H and platelets  Monitor s/sx of bleeding  [name]. Diona Foley, [name] Clinical Pharmacist Pager [phone] 01/02/2014,4:29 PM

## 2014-01-03 ENCOUNTER — Encounter (HOSPITAL_COMMUNITY): Payer: Self-pay | Admitting: Physician Assistant

## 2014-01-03 ENCOUNTER — Ambulatory Visit (HOSPITAL_COMMUNITY): Payer: Medicare Other

## 2014-01-03 DIAGNOSIS — I4891 Unspecified atrial fibrillation: Secondary | ICD-10-CM | POA: Diagnosis not present

## 2014-01-03 DIAGNOSIS — Z45018 Encounter for adjustment and management of other part of cardiac pacemaker: Secondary | ICD-10-CM | POA: Diagnosis not present

## 2014-01-03 DIAGNOSIS — I480 Paroxysmal atrial fibrillation: Secondary | ICD-10-CM

## 2014-01-03 DIAGNOSIS — R001 Bradycardia, unspecified: Secondary | ICD-10-CM | POA: Diagnosis not present

## 2014-01-03 DIAGNOSIS — I495 Sick sinus syndrome: Secondary | ICD-10-CM | POA: Diagnosis not present

## 2014-01-03 DIAGNOSIS — Z7901 Long term (current) use of anticoagulants: Secondary | ICD-10-CM

## 2014-01-03 DIAGNOSIS — Z950 Presence of cardiac pacemaker: Secondary | ICD-10-CM

## 2014-01-03 LAB — PROTIME-INR
INR: 2.24 — ABNORMAL HIGH (ref 0.00–1.49)
Prothrombin Time: 25 seconds — ABNORMAL HIGH (ref 11.6–15.2)

## 2014-01-03 LAB — CBC
HEMATOCRIT: 36.6 % (ref 36.0–46.0)
HEMOGLOBIN: 11.9 g/dL — AB (ref 12.0–15.0)
MCH: 29.2 pg (ref 26.0–34.0)
MCHC: 32.5 g/dL (ref 30.0–36.0)
MCV: 89.9 fL (ref 78.0–100.0)
Platelets: 249 10*3/uL (ref 150–400)
RBC: 4.07 MIL/uL (ref 3.87–5.11)
RDW: 13.1 % (ref 11.5–15.5)
WBC: 5.6 10*3/uL (ref 4.0–10.5)

## 2014-01-03 MED ORDER — WARFARIN SODIUM 5 MG PO TABS
5.0000 mg | ORAL_TABLET | Freq: Once | ORAL | Status: DC
  Filled 2014-01-03: qty 1

## 2014-01-03 MED ORDER — WARFARIN SODIUM 5 MG PO TABS: 2.5000 mg | ORAL_TABLET | Freq: Every day | ORAL | Status: DC

## 2014-01-03 NOTE — Progress Notes (Signed)
ANTICOAGULATION CONSULT NOTE - Follow Up Consult  Pharmacy Consult for coumadin Indication: atrial fibrillation  Allergies  Allergen Reactions  . Iodinated Diagnostic Agents Hives    HIVES 15MIN S/P IV CONTRAST INJECTION,WILL NEED 13 HR PREP FOR FUTURE INJECTIONS, ok s/p 50mg  po benadryl//a.calhoun  . Anti-Inflammatory Enzyme [Nutritional Supplements]     Retains fluids and headaches  . Arthrotec [Diclofenac-Misoprostol] Other (See Comments)    unknown  . Biaxin [Clarithromycin] Other (See Comments)    unknown  . Plavix [Clopidogrel Bisulfate] Other (See Comments)    unknown  . Spironolactone     Hair loss    Patient Measurements: Height: 5\' 5"  (165.1 cm) Weight: 160 lb 11.5 oz (72.9 kg) IBW/kg (Calculated) : 57 Heparin Dosing Weight:   Vital Signs: Temp: 97.7 F (36.5 C) (12/09 0641) Temp Source: Oral (12/09 0641) BP: 123/52 mmHg (12/09 0641) Pulse Rate: 78 (12/09 0641)  Labs:  Recent Labs  01/02/14 1221 01/03/14 0306  HGB  --  11.9*  HCT  --  36.6  PLT  --  249  LABPROT 23.9* 25.0*  INR 2.12* 2.24*    Estimated Creatinine Clearance: 44.7 mL/min (by C-G formula based on Cr of 0.87).   Medications:  Scheduled:  . digoxin  0.125 mg Oral Daily  . verapamil  180 mg Oral QHS  . Warfarin - Pharmacist Dosing Inpatient   Does not apply q1800   Infusions:    Assessment: 80 yo female with afib is currently on therapeutic coumadin.  INR today is 2.24.  Home coumadin dose is 5mg  daily except 2.5 mg on Mondays and Fridays  Goal of Therapy:  INR 2-2.5 per MD's request Monitor platelets by anticoagulation protocol: Yes   Plan:  - coumadin 5mg  po x1 - INR in am if not dishcarge  Metzli Pollick, Tsz-Yin 01/03/2014,8:40 AM

## 2014-01-03 NOTE — Discharge Summary (Signed)
CARDIOLOGY DISCHARGE SUMMARY   Patient ID: [name] MRN: [id number] DOB/AGE: 78-Feb-1925 78 y.o.  Admit date: 01/02/2014 Discharge date: 01/03/2014  PCP: [name], MD Primary Cardiologist: Dr. 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 female with a history of tachy/brady syndrome, with MDT PPM, and pericardial effusion. She came to the office for remote device check and it showed her pacemaker had reached ERI. She was having no other ongoing cardiac issues. It was recommended that she come to the hospital for a generator change and possibly a new RV lead.  She came to the hospital on 12/08 for the procedure. She had a generator change as well as insertion of a new RV lead and tolerated the procedure well.  On 12/09, she was seen by Dr. Gwenlyn Found, a two-view chest x-ray was performed, and her device was interrogated. The device was programmed to VVI mode, base rate 70 and was functioning well. The chest x-ray results are below, it showed no pneumothorax, pacer leads in good position. It showed some vascular congestion but she was not describing any shortness of breath, oxygen saturation was good on room air and her weight was at baseline. She had no symptoms or signs of volume overload by exam.   No further inpatient workup was indicated and she is considered stable for discharge, to follow up as an outpatient.  BP 123/52 mmHg  Pulse 78  Temp(Src) 97.7 F (36.5 C) (Oral)  Resp 20  Ht 5\' 5"  (1.651 m)  Wt 160 lb 11.5 oz (72.9 kg)  BMI 26.74 kg/m2   SpO2 93% General: Well developed, well nourished, female in no acute distress Head: Eyes PERRLA, No xanthomas.   Normocephalic and atraumatic  Lungs: Clear bilaterally to auscultation. Pacemaker implantation site upper right chest is without ecchymosis or hematoma. There is no bleeding or drainage. Heart: HRRR S1 S2, without MRG.  Pulses are 2+ & equal.  No JVD.   Abdomen: Bowel sounds are present, abdomen soft and non-tender without masses or  hernias noted. Msk: Normal strength and tone for age. Extremities: No clubbing, cyanosis or edema.    Skin:  No rashes or lesions noted. Neuro: Alert and oriented X 3. Psych:  Good affect, responds appropriately   Labs:   Lab Results  Component Value Date   WBC 5.6 01/03/2014   HGB 11.9* 01/03/2014   HCT 36.6 01/03/2014   MCV 89.9 01/03/2014   PLT 249 01/03/2014     Recent Labs Lab 12/28/13 1102  NA 142  K 4.0  CL 102  CO2 31  BUN 16  CREATININE 0.87  CALCIUM 9.6  PROT 6.7  BILITOT 0.5  ALKPHOS 64  ALT 16  AST 22  GLUCOSE 103*    Recent Labs  01/03/14 0306  INR 2.24*     Radiology: Dg Chest 2 View 01/03/2014   CLINICAL DATA:  Pacemaker insertion  EXAM: CHEST  2 VIEW  COMPARISON:  06/11/2012  FINDINGS: Right subclavian pacemaker device is in place. A third lead has been placed with its tip projecting over the right ventricle apex. No ensuing pneumothorax.  Vascular congestion has developed. Severe cardiomegaly persists. Right middle lobe consolidation versus atelectasis.  IMPRESSION: New right subclavian pacemaker lead placed without pneumothorax.  Right middle lobe atelectasis versus consolidation.  Vascular congestion.   Electronically Signed   By: Maryclare Bean M.D.   On: 01/03/2014 07:55   EKG: atrial fib, rate 75   FOLLOW UP PLANS AND APPOINTMENTS Allergies  Allergen Reactions  . Iodinated Diagnostic Agents Hives    HIVES 15MIN S/P IV CONTRAST INJECTION,WILL NEED 13 HR PREP FOR FUTURE INJECTIONS, ok s/p 50mg  po  benadryl//a.calhoun  . Anti-Inflammatory Enzyme [Nutritional Supplements]     Retains fluids and headaches  . Arthrotec [Diclofenac-Misoprostol] Other (See Comments)    unknown  . Biaxin [Clarithromycin] Other (See Comments)    unknown  . Plavix [Clopidogrel Bisulfate] Other (See Comments)    unknown  . Spironolactone     Hair loss     Medication List    TAKE these medications        codeine 15 MG tablet  Take 1 tablet (15 mg total) by mouth every 6 (six) hours as needed.     DIGITEK 0.125 MG tablet  Generic drug:  digoxin  TAKE 1 TABLET(S) BY MOUTH DAILY     multivitamin per tablet  Take 1 tablet by mouth daily.     potassium chloride 10 MEQ tablet  Commonly known as:  K-DUR,KLOR-CON  take 2 tablets by mouth once daily WHEN YOU TAKE TORSEMIDE     torsemide 20 MG tablet  Commonly known as:  DEMADEX  Take 1.5 tablets (30 mg total) by mouth daily.     verapamil 180 MG CR tablet  Commonly known as:  CALAN-SR  take 1 tablet by mouth EVERY NIGHT AT BEDTIME     VITAMIN B-1 PO  Take 1 tablet by mouth daily.     Vitamin D-3 1000 UNITS Caps  Take 2,000 Units by mouth daily.     warfarin 5 MG tablet  Commonly known as:  COUMADIN  Take 0.5-1 tablets (2.5-5 mg total) by mouth daily. Take 2.5mg  Mon and Fri and 5mg  all other days as directed per INR.        Discharge Instructions    Diet - low sodium heart healthy    Complete by:  As directed      Increase activity slowly    Complete by:  As directed           Follow-up Information    Follow up with [facility] On 01/15/2014.   Why:  Wound check and device check at 3:00 pm   Contact information:   [address] [place] [address]       Follow up with Sanda Klein, MD.   Specialty:  Cardiology   Why:  See in 3 months, the office will call.   Contact information:   [address] [place] [address] [phone]       BRING ALL MEDICATIONS WITH  YOU TO FOLLOW UP APPOINTMENTS  Time spent with patient to include physician time: 38 min Signed: Rosaria Ferries, PA-C 01/03/2014, 1:45 PM Co-Sign MD

## 2014-01-04 ENCOUNTER — Encounter (HOSPITAL_COMMUNITY): Payer: Self-pay | Admitting: Cardiovascular Disease

## 2014-01-05 ENCOUNTER — Encounter: Payer: Self-pay | Admitting: Cardiovascular Disease

## 2014-01-05 LAB — MDC_IDC_ENUM_SESS_TYPE_INCLINIC
Battery Impedance: 7498 Ohm
Battery Remaining Longevity: 0 mo
Battery Voltage: 2.6 V
Date Time Interrogation Session: 20151203160215
Lead Channel Impedance Value: 458 Ohm
Lead Channel Sensing Intrinsic Amplitude: 2 mV
MDC IDC MSMT LEADCHNL RA IMPEDANCE VALUE: 67 Ohm
MDC IDC SET LEADCHNL RV PACING AMPLITUDE: 2.5 V
MDC IDC SET LEADCHNL RV PACING PULSEWIDTH: 0.4 ms
MDC IDC SET LEADCHNL RV SENSING SENSITIVITY: 1 mV
MDC IDC STAT BRADY RV PERCENT PACED: 93 %

## 2014-01-05 LAB — POCT INR: INR: 2.2

## 2014-01-09 ENCOUNTER — Ambulatory Visit (INDEPENDENT_AMBULATORY_CARE_PROVIDER_SITE_OTHER): Payer: Medicare Other | Admitting: Pharmacist Clinician (PhC)/ Clinical Pharmacy Specialist

## 2014-01-09 DIAGNOSIS — I480 Paroxysmal atrial fibrillation: Secondary | ICD-10-CM

## 2014-01-09 DIAGNOSIS — Z7901 Long term (current) use of anticoagulants: Secondary | ICD-10-CM

## 2014-01-09 LAB — PROTIME-INR: INR: 2.1 — AB (ref 0.9–1.1)

## 2014-01-12 ENCOUNTER — Encounter: Payer: Self-pay | Admitting: Cardiology

## 2014-01-15 ENCOUNTER — Ambulatory Visit (INDEPENDENT_AMBULATORY_CARE_PROVIDER_SITE_OTHER): Payer: Medicare Other | Admitting: *Deleted

## 2014-01-15 DIAGNOSIS — I482 Chronic atrial fibrillation, unspecified: Secondary | ICD-10-CM

## 2014-01-15 LAB — MDC_IDC_ENUM_SESS_TYPE_INCLINIC
Battery Impedance: 100 Ohm
Battery Remaining Longevity: 118 mo
Battery Voltage: 2.79 V
Brady Statistic RV Percent Paced: 52 %
Date Time Interrogation Session: 20151221152316
Lead Channel Pacing Threshold Pulse Width: 0.4 ms
Lead Channel Sensing Intrinsic Amplitude: 8 mV
Lead Channel Setting Pacing Amplitude: 3.5 V
Lead Channel Setting Pacing Pulse Width: 0.4 ms
Lead Channel Setting Sensing Sensitivity: 4 mV
MDC IDC MSMT LEADCHNL RA IMPEDANCE VALUE: 0 Ohm
MDC IDC MSMT LEADCHNL RV IMPEDANCE VALUE: 659 Ohm
MDC IDC MSMT LEADCHNL RV PACING THRESHOLD AMPLITUDE: 0.5 V

## 2014-01-15 NOTE — Progress Notes (Signed)
Wound check appointment. Staples removed. Wound without redness or edema. Incision edges approximated, wound well healed. Normal device function. Thresholds, sensing, and impedances consistent with implant measurements. Device programmed at 3.5V/auto capture programmed on for extra safety margin until 3 month visit. Histogram distribution appropriate for patient and level of activity. No high ventricular rates noted. Patient educated about wound care, arm mobility, lifting restrictions. ROV in 3 months with implanting physician.

## 2014-01-23 LAB — POCT INR: INR: 2.3

## 2014-01-24 ENCOUNTER — Ambulatory Visit (INDEPENDENT_AMBULATORY_CARE_PROVIDER_SITE_OTHER): Payer: Medicare Other | Admitting: Pharmacist Clinician (PhC)/ Clinical Pharmacy Specialist

## 2014-01-24 DIAGNOSIS — Z7901 Long term (current) use of anticoagulants: Secondary | ICD-10-CM

## 2014-01-24 DIAGNOSIS — I482 Chronic atrial fibrillation, unspecified: Secondary | ICD-10-CM

## 2014-01-30 ENCOUNTER — Encounter: Payer: Self-pay | Admitting: Cardiovascular Disease

## 2014-02-02 ENCOUNTER — Encounter: Payer: Self-pay | Admitting: Cardiovascular Disease

## 2014-02-06 LAB — POCT INR: INR: 2.1

## 2014-02-08 ENCOUNTER — Ambulatory Visit (INDEPENDENT_AMBULATORY_CARE_PROVIDER_SITE_OTHER): Payer: Medicare Other | Admitting: Pharmacist Clinician (PhC)/ Clinical Pharmacy Specialist

## 2014-02-08 DIAGNOSIS — I482 Chronic atrial fibrillation, unspecified: Secondary | ICD-10-CM

## 2014-02-08 DIAGNOSIS — Z7901 Long term (current) use of anticoagulants: Secondary | ICD-10-CM

## 2014-02-20 LAB — POCT INR: INR: 2

## 2014-02-21 ENCOUNTER — Ambulatory Visit (INDEPENDENT_AMBULATORY_CARE_PROVIDER_SITE_OTHER): Payer: Medicare Other | Admitting: Pharmacist Clinician (PhC)/ Clinical Pharmacy Specialist

## 2014-02-21 DIAGNOSIS — Z7901 Long term (current) use of anticoagulants: Secondary | ICD-10-CM

## 2014-02-21 DIAGNOSIS — I482 Chronic atrial fibrillation, unspecified: Secondary | ICD-10-CM

## 2014-03-06 LAB — POCT INR: INR: 2.4

## 2014-03-07 ENCOUNTER — Ambulatory Visit (INDEPENDENT_AMBULATORY_CARE_PROVIDER_SITE_OTHER): Payer: Medicare Other | Admitting: Pharmacist Clinician (PhC)/ Clinical Pharmacy Specialist

## 2014-03-07 DIAGNOSIS — I482 Chronic atrial fibrillation, unspecified: Secondary | ICD-10-CM

## 2014-03-07 DIAGNOSIS — Z7901 Long term (current) use of anticoagulants: Secondary | ICD-10-CM

## 2014-03-20 ENCOUNTER — Encounter: Payer: Self-pay | Admitting: Emergency Medicine

## 2014-03-20 ENCOUNTER — Ambulatory Visit (INDEPENDENT_AMBULATORY_CARE_PROVIDER_SITE_OTHER): Payer: Medicare Other | Admitting: Emergency Medicine

## 2014-03-20 ENCOUNTER — Telehealth: Payer: Self-pay

## 2014-03-20 VITALS — BP 128/78 | HR 74 | Temp 98.0°F | Resp 16 | Ht 65.0 in | Wt 161.0 lb

## 2014-03-20 DIAGNOSIS — I319 Disease of pericardium, unspecified: Secondary | ICD-10-CM

## 2014-03-20 DIAGNOSIS — K5909 Other constipation: Secondary | ICD-10-CM

## 2014-03-20 DIAGNOSIS — I313 Pericardial effusion (noninflammatory): Secondary | ICD-10-CM

## 2014-03-20 DIAGNOSIS — E0865 Diabetes mellitus due to underlying condition with hyperglycemia: Secondary | ICD-10-CM | POA: Diagnosis not present

## 2014-03-20 DIAGNOSIS — I3139 Other pericardial effusion (noninflammatory): Secondary | ICD-10-CM

## 2014-03-20 DIAGNOSIS — R0789 Other chest pain: Secondary | ICD-10-CM

## 2014-03-20 DIAGNOSIS — R0781 Pleurodynia: Secondary | ICD-10-CM | POA: Diagnosis not present

## 2014-03-20 DIAGNOSIS — M810 Age-related osteoporosis without current pathological fracture: Secondary | ICD-10-CM

## 2014-03-20 LAB — CBC
HEMATOCRIT: 39.1 % (ref 36.0–46.0)
Hemoglobin: 13.5 g/dL (ref 12.0–15.0)
MCH: 30.4 pg (ref 26.0–34.0)
MCHC: 34.5 g/dL (ref 30.0–36.0)
MCV: 88.1 fL (ref 78.0–100.0)
MPV: 10.5 fL (ref 8.6–12.4)
PLATELETS: 233 10*3/uL (ref 150–400)
RBC: 4.44 MIL/uL (ref 3.87–5.11)
RDW: 13.2 % (ref 11.5–15.5)
WBC: 4.9 10*3/uL (ref 4.0–10.5)

## 2014-03-20 LAB — POCT INR: INR: 2.4

## 2014-03-20 LAB — PROTIME-INR

## 2014-03-20 LAB — POCT GLYCOSYLATED HEMOGLOBIN (HGB A1C): HEMOGLOBIN A1C: 6.3

## 2014-03-20 LAB — SEDIMENTATION RATE: SED RATE: 11 mm/h (ref 0–30)

## 2014-03-20 NOTE — Progress Notes (Addendum)
Subjective:  This chart was scribed for [name], MD by Donato Schultz, Medical Scribe. This patient was seen in Room  21  and the patient's care was started at 10:21 AM.   Patient ID: [name], female    DOB: 1923/04/07, 81 y.o.   MRN: [id number]  HPI HPI Comments: [name] is a 81 y.o. female with a history of a large pericardial effusion who presents to the [facility] for a 3 month hypertension follow-up.  She is still complaining of persistent right sided, achy abdominal pain that is no longer responding to Vitamin D supplements.  She has never gotten an x-ray of her ribs.  Laying on her right side aggravates the pain.  She does not have a history of shingles in that area and states that this pain is not similar to the pain she experienced when she had shingles.    She is also complaining of constant pain in her left great toe.      She would like a prescription for Guaifenesin because it is cheaper than Mucinex.  She takes 2 stool softeners nightly and a laxative twice weekly to treat her chronic constipation.   She takes Tylenol once nightly but would like another medication to take in its place.    She has seen the cardiologist since her last visit with me and was given a new pacemaker battery which is good for the next 10 years.  She denies chest pain and leg pain as associated symptoms.     Past Medical History  Diagnosis Date  . Pericardial effusion     Chronic. Last echo in April of 2012.   . Atrial fibrillation     Chronic  . Chronic anticoagulation     on Coumadin  . History of GI bleed     once treated with Fe infusion  . HTN (hypertension)   . Pacemaker     placed for tachybrady.   . Valvular heart disease   . Obesity   . LVH (left ventricular hypertrophy)   . Thrombophlebitis 1967    associated with pregnancy  . Diverticulosis   . Heart murmur   . Osteoporosis   . Hx of echocardiogram 07/28/2011    EF>55% small to moderate  and is mostly located posteriorly. there is no overt evidence of tamponade although subtle diastolic collapse of the right ventricle.  . Sleep apnea     mild-no cpap  . History of blood transfusion "several times"  . CHF (congestive heart failure)   . Pneumonia ~ 1986  . Positive TB test   . Anemia   . Iron deficiency anemia     "I've had an iron transfusion"  . Stroke April 2013    right frontal  . Arthritis   . Tachycardia-bradycardia syndrome     s/p Medtronic Miller  model number [id number], serial number [id number] H 12/2013   Past Surgical History  Procedure Laterality Date  . Breast lumpectomy Bilateral     negative for cancer  . Esophagogastroduodenoscopy  05/19/2011    Procedure: ESOPHAGOGASTRODUODENOSCOPY (EGD);  Surgeon: Lear Ng, MD;  Location: [facility] ENDOSCOPY;  Service: Endoscopy;  Laterality: N/A;  doctor aware of inr   will try to be here no later than 230  . Cholecystectomy  2005  . Esophagogastroduodenoscopy  06/22/2011    Procedure: ESOPHAGOGASTRODUODENOSCOPY (EGD);  Surgeon: Arta Silence, MD;  Location: [facility] ENDOSCOPY;  Service: Endoscopy;  Laterality: N/A;  Check PT/INR in  am  . Givens capsule study  06/23/2011    Procedure: GIVENS CAPSULE STUDY;  Surgeon: Arta Silence, MD;  Location: [facility] ENDOSCOPY;  Service: Endoscopy;  Laterality: N/A;  . Colonoscopy  08/11/2011    Procedure: COLONOSCOPY;  Surgeon: Jeryl Columbia, MD;  Location: [facility] ENDOSCOPY;  Service: Endoscopy;  Laterality: N/A;  . Hot hemostasis  08/11/2011    Procedure: HOT HEMOSTASIS (ARGON PLASMA COAGULATION/BICAP);  Surgeon: Jeryl Columbia, MD;  Location: [facility] ENDOSCOPY;  Service: Endoscopy;  Laterality: N/A;  . Mass excision Left 09/14/2013    Procedure: EXCISION MASS LEFT WRIST;  Surgeon: Leanora Cover, MD;  Location: [facility];  Service: Orthopedics;  Laterality: Left;  . Pacemaker generator change  01/02/2014    w/lead replacement  . Tonsillectomy and adenoidectomy  1950  . Insert / replace /  remove pacemaker  2001    Last generator in 2006; interrogated Dec 2012  . Cataract extraction w/ intraocular lens  implant, bilateral Bilateral   . Lipoma excision Left 07/2013    wrist  . Pacemaker generator change N/A 01/02/2014    Procedure: PACEMAKER GENERATOR CHANGE;  Surgeon: Sanda Klein, MD;  Location: [facility] CATH LAB;  Service: Cardiovascular;  Laterality: N/A;  . Lead revision N/A 01/02/2014    Procedure: LEAD REVISION;  Surgeon: Sanda Klein, MD;  Location: [facility] CATH LAB;  Service: Cardiovascular;  Laterality: N/A;   Family History  Problem Relation Age of Onset  . Stroke Mother   . Stroke Father   . Pneumonia Father    History   Social History  . Marital Status: Widowed    Spouse Name: N/A  . Number of Children: N/A  . Years of Education: N/A   Occupational History  . Not on file.   Social History Main Topics  . Smoking status: Never Smoker   . Smokeless tobacco: Never Used  . Alcohol Use: Yes     Comment: 01/02/2014 "I'll have a drink a few times/year"  . Drug Use: No  . Sexual Activity: No   Other Topics Concern  . Not on file   Social History Narrative   Allergies  Allergen Reactions  . Iodinated Diagnostic Agents Hives    HIVES 15MIN S/P IV CONTRAST INJECTION,WILL NEED 13 HR PREP FOR FUTURE INJECTIONS, ok s/p 50mg  po benadryl//a.calhoun  . Anti-Inflammatory Enzyme [Nutritional Supplements]     Retains fluids and headaches  . Arthrotec [Diclofenac-Misoprostol] Other (See Comments)    unknown  . Biaxin [Clarithromycin] Other (See Comments)    unknown  . Plavix [Clopidogrel Bisulfate] Other (See Comments)    unknown  . Spironolactone     Hair loss    Review of Systems  Cardiovascular: Negative for chest pain.  Gastrointestinal: Positive for abdominal pain.  Musculoskeletal: Negative for arthralgias.     Objective:  Physical Exam  Constitutional: She is oriented to person, place, and time. She appears well-developed and well-nourished.  She is  an elderly female who is hard of hearing but alert and cooperative.  HENT:  Head: Normocephalic and atraumatic.  Eyes: EOM are normal.  Neck: Normal range of motion. Neck supple. No thyromegaly present.  Cardiovascular: Normal rate.  An irregular rhythm present. Exam reveals distant heart sounds. Exam reveals no gallop and no friction rub.   Murmur heard.  Systolic murmur is present with a grade of 2/6  Heart sounds are distant with very irregular rhythm and 2/6 systolic murmur.  Pulmonary/Chest: Effort normal and breath sounds normal. No respiratory distress. She  has no wheezes. She has no rales.  Abdominal: Soft. Bowel sounds are normal. There is no tenderness.  Musculoskeletal: Normal range of motion.  Extremities have bilateral varicose veins with dependent rubor and mild distal left great toe tenderness.  Lymphadenopathy:    She has no cervical adenopathy.  Neurological: She is alert and oriented to person, place, and time.  Skin: Skin is warm and dry.  Psychiatric: She has a normal mood and affect. Her behavior is normal.  Nursing note and vitals reviewed.  Results for orders placed or performed in visit on 03/20/14  POCT glycosylated hemoglobin (Hb A1C)  Result Value Ref Range   Hemoglobin A1C 6.3     BP 128/78 mmHg  Pulse 74  Temp(Src) 98 F (36.7 C)  Resp 16  Ht 5\' 5"  (1.651 m)  Wt 161 lb (73.029 kg)  BMI 26.79 kg/m2  SpO2 98% Assessment & Plan:  Patient looks great. She does have this right-sided chest pain I'm not sure the origin of this. She did have a chest x-ray post-insertion of her new pacemaker and this showed only  cardiomegaly without pneumothorax or rib issues Could be possibly referred from her pericardial effusion. She does need to wear her  hearing aid. We'll recheck in  4 months. She continues close follow-up with her anticoagulation therapy through the cardiologist.I personally performed the services described in this documentation, which was scribed in my  presence. The recorded information has been reviewed and is accurate. Hemoglobin A1c was good at 6.3

## 2014-03-20 NOTE — Progress Notes (Deleted)
   Subjective:    Patient ID: [name], female    DOB: 09-12-1923, 81 y.o.   MRN: [id number]  HPI    Review of Systems     Objective:   Physical Exam        Assessment & Plan:

## 2014-03-20 NOTE — Progress Notes (Deleted)
   Subjective:    Patient ID: [name], female    DOB: [birth year], 81 y.o.   MRN: [id number]  HPI    Review of Systems     Objective:   Physical Exam        Assessment & Plan:

## 2014-03-20 NOTE — Telephone Encounter (Signed)
Tried to call patient for Dr. Everlene Farrier to give her her HgbA1c results of 6.3 but there was no answer and no machine.

## 2014-03-21 ENCOUNTER — Ambulatory Visit (INDEPENDENT_AMBULATORY_CARE_PROVIDER_SITE_OTHER): Payer: BLUE CROSS/BLUE SHIELD | Admitting: Pharmacist Clinician (PhC)/ Clinical Pharmacy Specialist

## 2014-03-21 DIAGNOSIS — I482 Chronic atrial fibrillation, unspecified: Secondary | ICD-10-CM

## 2014-03-21 DIAGNOSIS — Z7901 Long term (current) use of anticoagulants: Secondary | ICD-10-CM

## 2014-03-24 ENCOUNTER — Other Ambulatory Visit: Payer: Self-pay | Admitting: Cardiovascular Disease

## 2014-03-26 NOTE — Telephone Encounter (Signed)
Rx(s) sent to pharmacy electronically.  

## 2014-03-27 ENCOUNTER — Other Ambulatory Visit: Payer: Self-pay

## 2014-03-27 ENCOUNTER — Other Ambulatory Visit: Payer: Self-pay | Admitting: Emergency Medicine

## 2014-03-27 MED ORDER — TORSEMIDE 20 MG PO TABS: 30.0000 mg | ORAL_TABLET | Freq: Every day | ORAL | Status: DC

## 2014-03-27 NOTE — Telephone Encounter (Signed)
Rx(s) sent to pharmacy electronically.  

## 2014-04-01 LAB — POCT INR

## 2014-04-03 LAB — POCT INR: INR: 1.9

## 2014-04-04 ENCOUNTER — Encounter: Payer: Medicare Other | Admitting: Cardiovascular Disease

## 2014-04-06 ENCOUNTER — Ambulatory Visit (INDEPENDENT_AMBULATORY_CARE_PROVIDER_SITE_OTHER): Payer: BLUE CROSS/BLUE SHIELD | Admitting: Pharmacist Clinician (PhC)/ Clinical Pharmacy Specialist

## 2014-04-06 DIAGNOSIS — I482 Chronic atrial fibrillation, unspecified: Secondary | ICD-10-CM

## 2014-04-06 DIAGNOSIS — Z7901 Long term (current) use of anticoagulants: Secondary | ICD-10-CM

## 2014-04-17 LAB — POCT INR: INR: 2.1

## 2014-04-17 LAB — PROTIME-INR: INR: 2.1 — AB (ref 0.9–1.1)

## 2014-04-19 ENCOUNTER — Ambulatory Visit (INDEPENDENT_AMBULATORY_CARE_PROVIDER_SITE_OTHER): Payer: Medicare Other | Admitting: Pharmacist Clinician (PhC)/ Clinical Pharmacy Specialist

## 2014-04-19 DIAGNOSIS — Z7901 Long term (current) use of anticoagulants: Secondary | ICD-10-CM

## 2014-04-19 DIAGNOSIS — I482 Chronic atrial fibrillation, unspecified: Secondary | ICD-10-CM

## 2014-05-01 LAB — PROTIME-INR: INR: 3.5 — AB (ref 0.9–1.1)

## 2014-05-01 LAB — POCT INR: INR: 3.5

## 2014-05-02 ENCOUNTER — Ambulatory Visit (INDEPENDENT_AMBULATORY_CARE_PROVIDER_SITE_OTHER): Payer: BLUE CROSS/BLUE SHIELD | Admitting: Pharmacist Clinician (PhC)/ Clinical Pharmacy Specialist

## 2014-05-02 DIAGNOSIS — I482 Chronic atrial fibrillation, unspecified: Secondary | ICD-10-CM

## 2014-05-02 DIAGNOSIS — Z7901 Long term (current) use of anticoagulants: Secondary | ICD-10-CM

## 2014-05-04 ENCOUNTER — Other Ambulatory Visit: Payer: Self-pay | Admitting: Dermatology

## 2014-05-15 LAB — POCT INR: INR: 2.7

## 2014-05-17 ENCOUNTER — Ambulatory Visit (INDEPENDENT_AMBULATORY_CARE_PROVIDER_SITE_OTHER): Payer: BLUE CROSS/BLUE SHIELD | Admitting: Pharmacist Clinician (PhC)/ Clinical Pharmacy Specialist

## 2014-05-17 DIAGNOSIS — Z7901 Long term (current) use of anticoagulants: Secondary | ICD-10-CM

## 2014-05-17 DIAGNOSIS — I482 Chronic atrial fibrillation, unspecified: Secondary | ICD-10-CM

## 2014-05-22 ENCOUNTER — Ambulatory Visit (INDEPENDENT_AMBULATORY_CARE_PROVIDER_SITE_OTHER): Payer: Medicare Other | Admitting: Cardiovascular Disease

## 2014-05-22 ENCOUNTER — Encounter: Payer: Self-pay | Admitting: Cardiovascular Disease

## 2014-05-22 VITALS — BP 143/72 | HR 83 | Ht 65.0 in | Wt 165.4 lb

## 2014-05-22 DIAGNOSIS — I495 Sick sinus syndrome: Secondary | ICD-10-CM | POA: Diagnosis not present

## 2014-05-22 DIAGNOSIS — I482 Chronic atrial fibrillation, unspecified: Secondary | ICD-10-CM

## 2014-05-22 DIAGNOSIS — Z7901 Long term (current) use of anticoagulants: Secondary | ICD-10-CM

## 2014-05-22 DIAGNOSIS — Z8673 Personal history of transient ischemic attack (TIA), and cerebral infarction without residual deficits: Secondary | ICD-10-CM

## 2014-05-22 DIAGNOSIS — I3139 Other pericardial effusion (noninflammatory): Secondary | ICD-10-CM

## 2014-05-22 DIAGNOSIS — R0602 Shortness of breath: Secondary | ICD-10-CM | POA: Diagnosis not present

## 2014-05-22 DIAGNOSIS — Z79899 Other long term (current) drug therapy: Secondary | ICD-10-CM | POA: Diagnosis not present

## 2014-05-22 DIAGNOSIS — I313 Pericardial effusion (noninflammatory): Secondary | ICD-10-CM

## 2014-05-22 DIAGNOSIS — R609 Edema, unspecified: Secondary | ICD-10-CM

## 2014-05-22 DIAGNOSIS — R0600 Dyspnea, unspecified: Secondary | ICD-10-CM

## 2014-05-22 DIAGNOSIS — I319 Disease of pericardium, unspecified: Secondary | ICD-10-CM

## 2014-05-22 DIAGNOSIS — Z950 Presence of cardiac pacemaker: Secondary | ICD-10-CM

## 2014-05-22 DIAGNOSIS — I10 Essential (primary) hypertension: Secondary | ICD-10-CM

## 2014-05-22 LAB — MDC_IDC_ENUM_SESS_TYPE_INCLINIC
Battery Impedance: 137 Ohm
Battery Remaining Longevity: 114 mo
Battery Voltage: 2.79 V
Brady Statistic RV Percent Paced: 51 %
Date Time Interrogation Session: 20160426134922
Lead Channel Pacing Threshold Pulse Width: 0.4 ms
MDC IDC MSMT LEADCHNL RA IMPEDANCE VALUE: 0 Ohm
MDC IDC MSMT LEADCHNL RV IMPEDANCE VALUE: 536 Ohm
MDC IDC MSMT LEADCHNL RV PACING THRESHOLD AMPLITUDE: 0.625 V
MDC IDC MSMT LEADCHNL RV SENSING INTR AMPL: 5.6 mV
MDC IDC SET LEADCHNL RV PACING AMPLITUDE: 2.5 V
MDC IDC SET LEADCHNL RV PACING PULSEWIDTH: 0.4 ms
MDC IDC SET LEADCHNL RV SENSING SENSITIVITY: 5.6 mV

## 2014-05-22 MED ORDER — TORSEMIDE 20 MG PO TABS: 40.0000 mg | ORAL_TABLET | Freq: Every day | ORAL | Status: DC

## 2014-05-22 NOTE — Patient Instructions (Addendum)
Your physician has recommended you make the following change in your medication: increase the torsemide from 30 mg daily to 40 mg daily. A new prescription has been sent to your pharmacy.  Your physician has requested that you have an echocardiogram. Echocardiography is a painless test that uses sound waves to create images of your heart. It provides your doctor with information about the size and shape of your heart and how well your heart's chambers and valves are working. This procedure takes approximately one hour. There are no restrictions for this procedure.  Your physician recommends that you schedule a follow-up appointment in: 3 months for remote pacer check. 1 year with Dr. Sallyanne Kuster.  Your physician recommends that you return for lab work in: 3-4 weeks.

## 2014-05-22 NOTE — Progress Notes (Signed)
Patient ID: [name], female   DOB: 1923-04-04, 81 y.o.   MRN: [id number]     Cardiology Office Note   Date:  05/22/2014   ID:  [name], DOB [birth year], MRN [id number]  PCP:  [name], MD  Cardiologist:   Sanda Klein, MD   Chief Complaint  Patient presents with  . Follow-up    No complaints chest pain or dizzness.  Occas. SOB at night waking her.  Constant edema.      History of Present Illness: [name] is a 81 y.o. female who presents for pacemaker follow-up, roughly 3 months after her generator change out and placement of a new right ventricular lead for poor sensing. She has permanent atrial fibrillation with periods of slow ventricular response. Her current pacemaker is at beginning of life, Medtronic [place] implanted December 2015. Estimated generator longevity is 9.5 years. There is roughly 50% ventricular pacing. No episodes of high ventricular rate are recorded.  She denies chest pain, syncope palpitations or dizziness. Occasionally she has noticed that she becomes short of breath at night and has to sit up. She has persistent lower extremity edema that is likely multifactorial, but she has noticed that she responds less to diuretic dosing and her edema seems to be more severe. Her weight today is 4 pounds higher than it was in February but similar to last December.  She is on chronic warfarin anticoagulation has not had any recent bleeding complications. She does have a history of embolic stroke during a period of subtherapeutic anticoagulation in May 2013, but has also had gastrointestinal bleeding from arteriovenous malformations when her anticoagulation with supratherapeutic.  She has a pericardial effusion of uncertain etiology that was first identified in 2012. It was small to moderate on the echocardiogram performed in 07/02/2011 and probably unchanged but described as moderate on the echocardiogram performed 07/28/2011, moderate to large in  August 2014. Never showed signs of tamponade on    Past Medical History  Diagnosis Date  . Pericardial effusion     Chronic. Last echo in April of 2012.   . Atrial fibrillation     Chronic  . Chronic anticoagulation     on Coumadin  . History of GI bleed     once treated with Fe infusion  . HTN (hypertension)   . Pacemaker     placed for tachybrady.   . Valvular heart disease   . Obesity   . LVH (left ventricular hypertrophy)   . Thrombophlebitis 1967    associated with pregnancy  . Diverticulosis   . Heart murmur   . Osteoporosis   . Hx of echocardiogram 07/28/2011    EF>55% small to moderate and is mostly located posteriorly. there is no overt evidence of tamponade although subtle diastolic collapse of the right ventricle.  . Sleep apnea     mild-no cpap  . History of blood transfusion "several times"  . CHF (congestive heart failure)   . Pneumonia ~ 1986  . Positive TB test   . Anemia   . Iron deficiency anemia     "I've had an iron transfusion"  . Stroke April 2013    right frontal  . Arthritis   . Tachycardia-bradycardia syndrome     s/p Medtronic [place]  model number [id number], serial number [id number] H 12/2013    Past Surgical History  Procedure Laterality Date  . Breast lumpectomy Bilateral     negative for cancer  . Esophagogastroduodenoscopy  05/19/2011  Procedure: ESOPHAGOGASTRODUODENOSCOPY (EGD);  Surgeon: Lear Ng, MD;  Location: [facility] ENDOSCOPY;  Service: Endoscopy;  Laterality: N/A;  doctor aware of inr   will try to be here no later than 230  . Cholecystectomy  2005  . Esophagogastroduodenoscopy  06/22/2011    Procedure: ESOPHAGOGASTRODUODENOSCOPY (EGD);  Surgeon: Arta Silence, MD;  Location: [facility] ENDOSCOPY;  Service: Endoscopy;  Laterality: N/A;  Check PT/INR in am  . Givens capsule study  06/23/2011    Procedure: GIVENS CAPSULE STUDY;  Surgeon: Arta Silence, MD;  Location: [facility] ENDOSCOPY;  Service: Endoscopy;  Laterality: N/A;  . Colonoscopy   08/11/2011    Procedure: COLONOSCOPY;  Surgeon: Jeryl Columbia, MD;  Location: [facility] ENDOSCOPY;  Service: Endoscopy;  Laterality: N/A;  . Hot hemostasis  08/11/2011    Procedure: HOT HEMOSTASIS (ARGON PLASMA COAGULATION/BICAP);  Surgeon: Jeryl Columbia, MD;  Location: [facility] ENDOSCOPY;  Service: Endoscopy;  Laterality: N/A;  . Mass excision Left 09/14/2013    Procedure: EXCISION MASS LEFT WRIST;  Surgeon: Leanora Cover, MD;  Location: [place];  Service: Orthopedics;  Laterality: Left;  . Pacemaker generator change  01/02/2014    w/lead replacement  . Tonsillectomy and adenoidectomy  1950  . Insert / replace / remove pacemaker  2001    Last generator in 2006; interrogated Dec 2012  . Cataract extraction w/ intraocular lens  implant, bilateral Bilateral   . Lipoma excision Left 07/2013    wrist  . Pacemaker generator change N/A 01/02/2014    Procedure: PACEMAKER GENERATOR CHANGE;  Surgeon: Sanda Klein, MD;  Location: [place] CATH LAB;  Service: Cardiovascular;  Laterality: N/A;  . Lead revision N/A 01/02/2014    Procedure: LEAD REVISION;  Surgeon: Sanda Klein, MD;  Location: [place] CATH LAB;  Service: Cardiovascular;  Laterality: N/A;     Current Outpatient Prescriptions  Medication Sig Dispense Refill  . codeine 15 MG tablet Take 1 tablet (15 mg total) by mouth every 6 (six) hours as needed. 30 tablet 0  . DIGITEK 125 MCG tablet TAKE 1 TABLET(S) BY MOUTH DAILY 30 tablet 5  . multivitamin (THERAGRAN) per tablet Take 1 tablet by mouth daily.      . potassium chloride (K-DUR,KLOR-CON) 10 MEQ tablet take 2 tablets by mouth once daily WHEN YOU TAKE TORSEMIDE 180 tablet 1  . Thiamine HCl (VITAMIN B-1 PO) Take 1 tablet by mouth daily.    Marland Kitchen torsemide (DEMADEX) 20 MG tablet Take 2 tablets (40 mg total) by mouth daily. 60 tablet 10  . verapamil (CALAN-SR) 180 MG CR tablet Take 1 tablet (180 mg total) by mouth at bedtime. 30 tablet 10  . warfarin (COUMADIN) 5 MG tablet Take 0.5-1 tablets (2.5-5 mg  total) by mouth daily. Take 2.5mg  Mon and Fri and 5mg  all other days as directed per INR. 90 tablet 3   No current facility-administered medications for this visit.    Allergies:   Iodinated diagnostic agents; Anti-inflammatory enzyme; Arthrotec; Biaxin; Plavix; and Spironolactone    Social History:  The patient  reports that she has never smoked. She has never used smokeless tobacco. She reports that she drinks alcohol. She reports that she does not use illicit drugs.   Family History:  The patient's family history includes Pneumonia in her father; Stroke in her father and mother.    ROS:  Please see the history of present illness.    Otherwise, review of systems positive for worsening lower extremity edema, possible orthopnea.   All other systems are reviewed  and negative.    PHYSICAL EXAM: VS:  BP 143/72 mmHg  Pulse 83  Ht 5\' 5"  (1.651 m)  Wt 165 lb 6.4 oz (75.025 kg)  BMI 27.52 kg/m2 , BMI Body mass index is 27.52 kg/(m^2).  General: Alert, oriented x3, no distress  Head: no evidence of trauma, PERRL, EOMI, no exophtalmos or lid lag, no myxedema, no xanthelasma; normal ears, nose and oropharynx  Neck: normal jugular venous pulsations and no hepatojugular reflux; brisk carotid pulses without delay and no carotid bruits  Chest: clear to auscultation, no signs of consolidation by percussion or palpation, normal fremitus, symmetrical and full respiratory excursions  Cardiovascular: normal position and quality of the apical impulse, irregular rhythm, normal first and second heart sounds, no murmurs, rubs or gallomcrross ps  Abdomen: no tenderness or distention, no masses by palpation, no abnormal pulsatility or arterial bruits, normal bowel sounds, no hepatosplenomegaly  Extremities: no clubbing, cyanosis or edema; 2+ radial, ulnar and brachial pulses bilaterally; 2+ right femoral, posterior tibial and dorsalis pedis pulses; 2+ left femoral, posterior tibial and dorsalis pedis  pulses; no subclavian or femoral bruits  Neurological: There is very mild left facial droop and slightly weaker grip on the left side  Psych: euthymic mood, full affect   EKG:  EKG is ordered today. The ekg ordered today demonstrates atrial fibrillation   Recent Labs: 12/28/2013: ALT 16; BUN 16; Creatinine 0.87; Potassium 4.0; Sodium 142; TSH 2.154 03/20/2014: Hemoglobin 13.5; Platelets 233    Lipid Panel    Component Value Date/Time   CHOL 160 08/03/2013 1155   TRIG 114 08/03/2013 1155   HDL 37* 08/03/2013 1155   CHOLHDL 4.3 08/03/2013 1155   VLDL 23 08/03/2013 1155   LDLCALC 100* 08/03/2013 1155      Wt Readings from Last 3 Encounters:  05/22/14 165 lb 6.4 oz (75.025 kg)  03/20/14 161 lb (73.029 kg)  01/03/14 160 lb 11.5 oz (72.9 kg)       ASSESSMENT AND PLAN:  Atrial fibrillation  Stopping the verapamil would probably allow for less ventricular pacing, but she tells me that when she missed a few doses she had very unpleasant palpitations and does not think she can come off this medication. She should remain on full dose anticoagulation barring serious bleeding complications, since she has had a stroke when subtherapeutically anticoagulated in the past.  Pacemaker  Medtronic [place] implanted 2006, generator change Dec 2015. Dual chamber device now programmed VVIR for permanent atrial fibrillation.   Pericardial effusion, large  Etiology unknown. This was present before pacemaker implantation. Clinically there are no signs of tamponade, but she has increasing diuretic requirements. Plan a repeat echocardiogram has not yet been performed.   Her dyspnea and worsening edema may be a sign of subacute tamponade or an expression of diastolic heart failure. Will increase the dose of diuretic. If the pericardial effusion shows evidence of hemodynamic impairment of left ventricular filling she will need a pericardiocentesis. Stopping her anticoagulation will have to be  performed very cautiously since she has had previous stroke when anticoagulation was subtherapeutic.   Current medicines are reviewed at length with the patient today.  The patient does not have concerns regarding medicines.  The following changes have been made:  Increase torsemide to 40 mg daily  Labs/ tests ordered today include:  Orders Placed This Encounter  Procedures  . Basic metabolic panel  . Digoxin level  . 2D Echocardiogram without contrast    Patient Instructions  Your physician has recommended  you make the following change in your medication: increase the torsemide from 30 mg daily to 40 mg daily. A new prescription has been sent to your pharmacy.  Your physician has requested that you have an echocardiogram. Echocardiography is a painless test that uses sound waves to create images of your heart. It provides your doctor with information about the size and shape of your heart and how well your heart's chambers and valves are working. This procedure takes approximately one hour. There are no restrictions for this procedure.  Your physician recommends that you schedule a follow-up appointment in: 3 months for remote pacer check. 1 year with Dr. Sallyanne Kuster.  Your physician recommends that you return for lab work in: 3-4 weeks.    Mikael Spray, MD  05/22/2014 1:29 PM    Sanda Klein, MD, [facility] [phone] office [phone] pager

## 2014-05-24 ENCOUNTER — Other Ambulatory Visit: Payer: Self-pay | Admitting: *Deleted

## 2014-05-24 DIAGNOSIS — R0602 Shortness of breath: Secondary | ICD-10-CM

## 2014-05-24 NOTE — Progress Notes (Signed)
Cupid order placed

## 2014-05-29 LAB — POCT INR: INR: 2.2

## 2014-05-31 ENCOUNTER — Ambulatory Visit (INDEPENDENT_AMBULATORY_CARE_PROVIDER_SITE_OTHER): Payer: Medicare Other | Admitting: Pharmacist Clinician (PhC)/ Clinical Pharmacy Specialist

## 2014-05-31 DIAGNOSIS — I482 Chronic atrial fibrillation, unspecified: Secondary | ICD-10-CM

## 2014-05-31 DIAGNOSIS — Z7901 Long term (current) use of anticoagulants: Secondary | ICD-10-CM

## 2014-06-06 ENCOUNTER — Inpatient Hospital Stay (HOSPITAL_COMMUNITY): Admission: RE | Admit: 2014-06-06 | Payer: Medicare Other | Source: Ambulatory Visit

## 2014-06-12 LAB — POCT INR: INR: 2.6

## 2014-06-13 ENCOUNTER — Ambulatory Visit (INDEPENDENT_AMBULATORY_CARE_PROVIDER_SITE_OTHER): Payer: Medicare Other | Admitting: Pharmacist Clinician (PhC)/ Clinical Pharmacy Specialist

## 2014-06-13 DIAGNOSIS — I482 Chronic atrial fibrillation, unspecified: Secondary | ICD-10-CM

## 2014-06-13 DIAGNOSIS — Z7901 Long term (current) use of anticoagulants: Secondary | ICD-10-CM

## 2014-06-25 LAB — POCT INR: INR: 1.5

## 2014-06-27 ENCOUNTER — Ambulatory Visit (INDEPENDENT_AMBULATORY_CARE_PROVIDER_SITE_OTHER): Payer: Medicare Other | Admitting: Pharmacist Clinician (PhC)/ Clinical Pharmacy Specialist

## 2014-06-27 DIAGNOSIS — I482 Chronic atrial fibrillation, unspecified: Secondary | ICD-10-CM

## 2014-06-27 DIAGNOSIS — Z7901 Long term (current) use of anticoagulants: Secondary | ICD-10-CM

## 2014-06-28 ENCOUNTER — Encounter: Payer: Self-pay | Admitting: Cardiovascular Disease

## 2014-06-28 ENCOUNTER — Telehealth: Payer: Self-pay | Admitting: Emergency Medicine

## 2014-06-28 NOTE — Telephone Encounter (Signed)
Spoke with pt, I advised her to RTC. She will see Dr. Everlene Farrier tomorrow.

## 2014-06-28 NOTE — Telephone Encounter (Signed)
Patient states that she was outside mowing her lawn and a tick bit her neck. She states that by the time she realized it was a tick, 2-3 had already passed. 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 Please advise.  [phone]

## 2014-06-29 ENCOUNTER — Ambulatory Visit (INDEPENDENT_AMBULATORY_CARE_PROVIDER_SITE_OTHER): Payer: Medicare Other | Admitting: Emergency Medicine

## 2014-06-29 VITALS — BP 110/64 | HR 73 | Temp 98.4°F | Resp 20 | Ht 64.5 in | Wt 166.2 lb

## 2014-06-29 DIAGNOSIS — S40261A Insect bite (nonvenomous) of right shoulder, initial encounter: Secondary | ICD-10-CM

## 2014-06-29 DIAGNOSIS — W57XXXA Bitten or stung by nonvenomous insect and other nonvenomous arthropods, initial encounter: Secondary | ICD-10-CM | POA: Diagnosis not present

## 2014-06-29 MED ORDER — MUPIROCIN 2 % EX OINT: TOPICAL_OINTMENT | CUTANEOUS | Status: DC

## 2014-06-29 NOTE — Patient Instructions (Signed)
Tick Bite Information Ticks are insects that attach themselves to the skin and draw blood for food. There are various types of ticks. Common types include wood ticks and deer ticks. Most ticks live in shrubs and grassy areas. Ticks can climb onto your body when you make contact with leaves or grass where the tick is waiting. The most common places on the body for ticks to attach themselves are the scalp, neck, armpits, waist, and groin. Most tick bites are harmless, but sometimes ticks carry germs that cause diseases. These germs can be spread to a person during the tick's feeding process. The chance of a disease spreading through a tick bite depends on:   The type of tick.  Time of year.   How long the tick is attached.   Geographic location.  HOW CAN YOU PREVENT TICK BITES? Take these steps to help prevent tick bites when you are outdoors:  Wear protective clothing. Long sleeves and long pants are best.   Wear white clothes so you can see ticks more easily.  Tuck your pant legs into your socks.   If walking on a trail, stay in the middle of the trail to avoid brushing against bushes.  Avoid walking through areas with long grass.  Put insect repellent on all exposed skin and along boot tops, pant legs, and sleeve cuffs.   Check clothing, hair, and skin repeatedly and before going inside.   Brush off any ticks that are not attached.  Take a shower or bath as soon as possible after being outdoors.  WHAT IS THE PROPER WAY TO REMOVE A TICK? Ticks should be removed as soon as possible to help prevent diseases caused by tick bites. 1. If latex gloves are available, put them on before trying to remove a tick.  2. Using fine-point tweezers, grasp the tick as close to the skin as possible. You may also use curved forceps or a tick removal tool. Grasp the tick as close to its head as possible. Avoid grasping the tick on its body. 3. Pull gently with steady upward pressure until  the tick lets go. Do not twist the tick or jerk it suddenly. This may break off the tick's head or mouth parts. 4. Do not squeeze or crush the tick's body. This could force disease-carrying fluids from the tick into your body.  5. After the tick is removed, wash the bite area and your hands with soap and water or other disinfectant such as alcohol. 6. Apply a small amount of antiseptic cream or ointment to the bite site.  7. Wash and disinfect any instruments that were used.  Do not try to remove a tick by applying a hot match, petroleum jelly, or fingernail polish to the tick. These methods do not work and may increase the chances of disease being spread from the tick bite.  WHEN SHOULD YOU SEEK MEDICAL CARE? Contact your health care provider if you are unable to remove a tick from your skin or if a part of the tick breaks off and is stuck in the skin.  After a tick bite, you need to be aware of signs and symptoms that could be related to diseases spread by ticks. Contact your health care provider if you develop any of the following in the days or weeks after the tick bite:  Unexplained fever.  Rash. A circular rash that appears days or weeks after the tick bite may indicate the possibility of Lyme disease. The rash may resemble   a target with a bull's-eye and may occur at a different part of your body than the tick bite.  Redness and swelling in the area of the tick bite.   Tender, swollen lymph glands.   Diarrhea.   Weight loss.   Cough.   Fatigue.   Muscle, joint, or bone pain.   Abdominal pain.   Headache.   Lethargy or a change in your level of consciousness.  Difficulty walking or moving your legs.   Numbness in the legs.   Paralysis.  Shortness of breath.   Confusion.   Repeated vomiting.  Document Released: 01/10/2000 Document Revised: 11/02/2012 Document Reviewed: 06/22/2012 ExitCare Patient Information 2015 [employer]. This information is  not intended to replace advice given to you by your health care provider. Make sure you discuss any questions you have with your health care provider.  

## 2014-06-29 NOTE — Progress Notes (Signed)
Subjective:  This chart was scribed for [name], by [name], at [facility].  This patient was seen in room 3 and the patient's care was started at 1:28 PM.    Patient ID: [name], female    DOB: 25-Mar-1923, 81 y.o.   MRN: [id number] Chief Complaint  Patient presents with  . Insect Bite    tick bite to her right shoulder--area red     HPI  HPI Comments: [name] is a 81 y.o. female who presents to the Emergency Department for a tick bite on her right shoulder on Monday( four days ago) and took the tick off on Wednesday (two days ago).  Patient has associated symptoms of redness to the area. She has no other complaints today.     Past Medical History  Diagnosis Date  . Pericardial effusion     Chronic. Last echo in April of 2012.   . Atrial fibrillation     Chronic  . Chronic anticoagulation     on Coumadin  . History of GI bleed     once treated with Fe infusion  . HTN (hypertension)   . Pacemaker     placed for tachybrady.   . Valvular heart disease   . Obesity   . LVH (left ventricular hypertrophy)   . Thrombophlebitis 1967    associated with pregnancy  . Diverticulosis   . Heart murmur   . Osteoporosis   . Hx of echocardiogram 07/28/2011    EF>55% small to moderate and is mostly located posteriorly. there is no overt evidence of tamponade although subtle diastolic collapse of the right ventricle.  . Sleep apnea     mild-no cpap  . History of blood transfusion "several times"  . CHF (congestive heart failure)   . Pneumonia ~ 1986  . Positive TB test   . Anemia   . Iron deficiency anemia     "I've had an iron transfusion"  . Stroke April 2013    right frontal  . Arthritis   . Tachycardia-bradycardia syndrome     s/p Medtronic [place]  model number [id number], serial number [id number] H 12/2013    Current Outpatient Prescriptions on File Prior to Visit  Medication Sig Dispense Refill  . codeine 15 MG tablet  Take 1 tablet (15 mg total) by mouth every 6 (six) hours as needed. 30 tablet 0  . DIGITEK 125 MCG tablet TAKE 1 TABLET(S) BY MOUTH DAILY 30 tablet 5  . multivitamin (THERAGRAN) per tablet Take 1 tablet by mouth daily.      . potassium chloride (K-DUR,KLOR-CON) 10 MEQ tablet take 2 tablets by mouth once daily WHEN YOU TAKE TORSEMIDE 180 tablet 1  . Thiamine HCl (VITAMIN B-1 PO) Take 1 tablet by mouth daily.    Marland Kitchen torsemide (DEMADEX) 20 MG tablet Take 2 tablets (40 mg total) by mouth daily. 60 tablet 10  . verapamil (CALAN-SR) 180 MG CR tablet Take 1 tablet (180 mg total) by mouth at bedtime. 30 tablet 10  . warfarin (COUMADIN) 5 MG tablet Take 0.5-1 tablets (2.5-5 mg total) by mouth daily. Take 2.5mg  Mon and Fri and 5mg  all other days as directed per INR. 90 tablet 3   No current facility-administered medications on file prior to visit.    Allergies  Allergen Reactions  . Iodinated Diagnostic Agents Hives    HIVES 15MIN S/P IV CONTRAST INJECTION,WILL NEED 13 HR PREP FOR FUTURE INJECTIONS, ok s/p 50mg  po benadryl//a.calhoun  .  Anti-Inflammatory Enzyme [Nutritional Supplements]     Retains fluids and headaches  . Arthrotec [Diclofenac-Misoprostol] Other (See Comments)    unknown  . Biaxin [Clarithromycin] Other (See Comments)    unknown  . Plavix [Clopidogrel Bisulfate] Other (See Comments)    unknown  . Spironolactone     Hair loss    Recent Results (from the past 2160 hour(s))  POCT INR     Status: None   Collection Time: 04/03/14 12:00 AM  Result Value Ref Range   INR 1.9   POCT INR     Status: None   Collection Time: 04/17/14 12:00 AM  Result Value Ref Range   INR 2.1   Protime-INR     Status: Abnormal   Collection Time: 04/17/14 12:00 AM  Result Value Ref Range   INR 2.1 (A) 0.9 - 1.1  POCT INR     Status: None   Collection Time: 05/01/14 12:00 AM  Result Value Ref Range   INR 3.5   Protime-INR     Status: Abnormal   Collection Time: 05/01/14 12:00 AM  Result Value  Ref Range   INR 3.5 (A) 0.9 - 1.1  POCT INR     Status: None   Collection Time: 05/15/14 12:00 AM  Result Value Ref Range   INR 2.7   Implantable device check     Status: None   Collection Time: 05/22/14  1:49 PM  Result Value Ref Range   Date Time Interrogation Session [id number]    Implantable Pulse Generator Manufacturer Medtronic    Implantable Pulse Generator Model [id number] Sensia    Implantable Pulse Generator Serial Number [id number]    Lead Channel Setting Sensing Sensitivity 5.6 mV   Lead Channel Setting Pacing Pulse Width 0.4 ms   Lead Channel Setting Pacing Amplitude 2.5 V   Lead Channel Impedance Value 0 ohm   Lead Channel Impedance Value 536 ohm   Lead Channel Sensing Intrinsic Amplitude 5.6 mV   Lead Channel Pacing Threshold Amplitude 0.625 V   Lead Channel Pacing Threshold Pulse Width 0.4 ms   Battery Status Unknown    Battery Remaining Longevity 114 mo   Battery Voltage 2.79 V   Battery Impedance 137 ohm   Brady Statistic RV Percent Paced 51 %   Eval Rhythm VS/VP    Miscellaneous Comment      Pacemaker check in clinic via 360. Device function reviewed. Impedance, sensing, and auto capture threshold consistent with previous measurements. Histograms appropriate for patient and level of activity. All other diagnostic data reviewed and is  appropriate and stable for patient. Real time/magnet EGM shows appropriate sensing and capture. Permanent AF + Warfarin. No ventricular high rate episodes. Estimated longevity 9.5 years. Plan to follow up via [facility] on 7/26 and with [place] in 12 months.   POCT INR     Status: None   Collection Time: 05/29/14 12:00 AM  Result Value Ref Range   INR 2.2   POCT INR     Status: None   Collection Time: 06/12/14 12:00 AM  Result Value Ref Range   INR 2.6   POCT INR     Status: None   Collection Time: 06/25/14 12:00 AM  Result Value Ref Range   INR 1.5        Review of Systems  Constitutional: Negative for fever and chills.    Gastrointestinal: Negative for nausea and vomiting.  Skin: Positive for wound.       Objective:   Physical Exam  CONSTITUTIONAL: Well developed/well nourished HEAD: Normocephalic/atraumatic EYES: EOMI/PERRL ENMT: Mucous membranes moist NECK: supple no meningeal signs SPINE/BACK:entire spine nontender CV: S1/S2 noted, no murmurs/rubs/gallops noted LUNGS: Lungs are clear to auscultation bilaterally, no apparent distress ABDOMEN: soft, nontender, no rebound or guarding, bowel sounds noted throughout abdomen GU:no cva tenderness NEURO: Pt is awake/alert/appropriate, moves all extremitiesx4.  No facial droop.   EXTREMITIES: pulses normal/equal, full ROM SKIN: She has a 1 by 2 cm red area on her lower right neck.     PSYCH: no abnormalities of mood noted, alert and oriented to situation   Filed Vitals:   06/29/14 1321  BP: 110/64  Pulse: 73  Temp: 98.4 F (36.9 C)  TempSrc: Oral  Resp: 20  Height: 5' 4.5" (1.638 m)  Weight: 166 lb 4 oz (75.411 kg)  SpO2: 97%         Assessment & Plan:  I was not able to identify the tic on the computer. Will treat the area of inflammation with Bactroban. 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 She will return to clinic if she becomes ill. I was fearful to put her on antibodies because she is on Coumadin.I personally performed the services described in this documentation, which was scribed in my presence. The recorded information has been reviewed and is accurate.  Nena [name], [name]

## 2014-07-02 LAB — WOUND CULTURE
GRAM STAIN: NONE SEEN
Gram Stain: NONE SEEN
Gram Stain: NONE SEEN

## 2014-07-03 ENCOUNTER — Ambulatory Visit: Payer: BLUE CROSS/BLUE SHIELD | Admitting: Emergency Medicine

## 2014-07-05 ENCOUNTER — Ambulatory Visit (HOSPITAL_COMMUNITY)
Admission: RE | Admit: 2014-07-05 | Discharge: 2014-07-05 | Disposition: A | Discharge status: Home / Self Care | Payer: Medicare Other | Source: Ambulatory Visit | Attending: Cardiovascular Disease | Admitting: Cardiovascular Disease

## 2014-07-05 DIAGNOSIS — R0600 Dyspnea, unspecified: Secondary | ICD-10-CM | POA: Insufficient documentation

## 2014-07-05 DIAGNOSIS — I10 Essential (primary) hypertension: Secondary | ICD-10-CM | POA: Insufficient documentation

## 2014-07-05 DIAGNOSIS — R0602 Shortness of breath: Secondary | ICD-10-CM

## 2014-07-06 ENCOUNTER — Telehealth: Payer: Self-pay | Admitting: *Deleted

## 2014-07-06 DIAGNOSIS — I313 Pericardial effusion (noninflammatory): Secondary | ICD-10-CM

## 2014-07-06 DIAGNOSIS — I3139 Other pericardial effusion (noninflammatory): Secondary | ICD-10-CM

## 2014-07-06 NOTE — Telephone Encounter (Signed)
Echo results called to patient and will get repeat in 6 months.  Patient voiced understanding.

## 2014-07-06 NOTE — Telephone Encounter (Signed)
-----   Message from Sanda Klein, MD sent at 07/05/2014  8:35 PM EDT ----- Pericardial effusion remains large, but not interfering with heart function. Excellent cardiac function otherwise. Would like to repeat in 6 months, sooner if she develops syncope, worsening dyspnea or edema.

## 2014-07-10 LAB — POCT INR: INR: 2.6

## 2014-07-10 LAB — PROTIME-INR: INR: 2.6 — AB (ref 0.9–1.1)

## 2014-07-11 ENCOUNTER — Ambulatory Visit (INDEPENDENT_AMBULATORY_CARE_PROVIDER_SITE_OTHER): Payer: Medicare Other | Admitting: Pharmacist Clinician (PhC)/ Clinical Pharmacy Specialist

## 2014-07-11 DIAGNOSIS — Z7901 Long term (current) use of anticoagulants: Secondary | ICD-10-CM

## 2014-07-11 DIAGNOSIS — I482 Chronic atrial fibrillation, unspecified: Secondary | ICD-10-CM

## 2014-07-13 ENCOUNTER — Encounter: Payer: Self-pay | Admitting: Cardiology

## 2014-07-13 DIAGNOSIS — I6310 Cerebral infarction due to embolism of unspecified precerebral artery: Secondary | ICD-10-CM

## 2014-07-13 DIAGNOSIS — I482 Chronic atrial fibrillation, unspecified: Secondary | ICD-10-CM

## 2014-07-13 DIAGNOSIS — Z7901 Long term (current) use of anticoagulants: Secondary | ICD-10-CM

## 2014-07-13 NOTE — Progress Notes (Signed)
This encounter was created in error - please disregard.

## 2014-07-17 ENCOUNTER — Encounter: Payer: Self-pay | Admitting: Emergency Medicine

## 2014-07-17 ENCOUNTER — Ambulatory Visit (INDEPENDENT_AMBULATORY_CARE_PROVIDER_SITE_OTHER): Payer: Medicare Other | Admitting: Emergency Medicine

## 2014-07-17 ENCOUNTER — Ambulatory Visit (INDEPENDENT_AMBULATORY_CARE_PROVIDER_SITE_OTHER): Payer: Medicare Other

## 2014-07-17 VITALS — BP 142/76 | HR 69 | Temp 98.2°F | Resp 16 | Ht 64.0 in | Wt 161.8 lb

## 2014-07-17 DIAGNOSIS — R0781 Pleurodynia: Secondary | ICD-10-CM

## 2014-07-17 DIAGNOSIS — I10 Essential (primary) hypertension: Secondary | ICD-10-CM

## 2014-07-17 DIAGNOSIS — I319 Disease of pericardium, unspecified: Secondary | ICD-10-CM | POA: Diagnosis not present

## 2014-07-17 DIAGNOSIS — I6310 Cerebral infarction due to embolism of unspecified precerebral artery: Secondary | ICD-10-CM | POA: Diagnosis not present

## 2014-07-17 DIAGNOSIS — E0865 Diabetes mellitus due to underlying condition with hyperglycemia: Secondary | ICD-10-CM | POA: Diagnosis not present

## 2014-07-17 DIAGNOSIS — R109 Unspecified abdominal pain: Secondary | ICD-10-CM

## 2014-07-17 DIAGNOSIS — I313 Pericardial effusion (noninflammatory): Secondary | ICD-10-CM

## 2014-07-17 DIAGNOSIS — I3139 Other pericardial effusion (noninflammatory): Secondary | ICD-10-CM

## 2014-07-17 LAB — COMPREHENSIVE METABOLIC PANEL
ALT: 19 U/L (ref 0–35)
AST: 18 U/L (ref 0–37)
Albumin: 4.2 g/dL (ref 3.5–5.2)
Alkaline Phosphatase: 81 U/L (ref 39–117)
BILIRUBIN TOTAL: 0.6 mg/dL (ref 0.2–1.2)
BUN: 21 mg/dL (ref 6–23)
CHLORIDE: 99 meq/L (ref 96–112)
CO2: 31 meq/L (ref 19–32)
Calcium: 9.3 mg/dL (ref 8.4–10.5)
Creat: 1.1 mg/dL (ref 0.50–1.10)
GLUCOSE: 182 mg/dL — AB (ref 70–99)
Potassium: 3.7 mEq/L (ref 3.5–5.3)
SODIUM: 140 meq/L (ref 135–145)
Total Protein: 6.8 g/dL (ref 6.0–8.3)

## 2014-07-17 LAB — POCT URINALYSIS DIPSTICK
Bilirubin, UA: NEGATIVE
Glucose, UA: 250
KETONES UA: NEGATIVE
LEUKOCYTES UA: NEGATIVE
Nitrite, UA: NEGATIVE
Protein, UA: NEGATIVE
Spec Grav, UA: 1.01
UROBILINOGEN UA: 0.2
pH, UA: 6.5

## 2014-07-17 LAB — POCT UA - MICROSCOPIC ONLY
Bacteria, U Microscopic: NEGATIVE
CASTS, UR, LPF, POC: NEGATIVE
Crystals, Ur, HPF, POC: NEGATIVE
Epithelial cells, urine per micros: NEGATIVE
Mucus, UA: NEGATIVE
Yeast, UA: NEGATIVE

## 2014-07-17 LAB — POCT GLYCOSYLATED HEMOGLOBIN (HGB A1C): Hemoglobin A1C: 6.8

## 2014-07-17 LAB — GLUCOSE, POCT (MANUAL RESULT ENTRY): POC Glucose: 183 mg/dl — AB (ref 70–99)

## 2014-07-17 MED ORDER — GUAIFENESIN ER 1200 MG PO TB12: 1.0000 | ORAL_TABLET | Freq: Every day | ORAL | Status: DC

## 2014-07-17 MED ORDER — CODEINE SULFATE 15 MG PO TABS: 15.0000 mg | ORAL_TABLET | Freq: Four times a day (QID) | ORAL | Status: DC | PRN

## 2014-07-17 NOTE — Progress Notes (Addendum)
Subjective:    Patient ID: [name], female    DOB: Nov 15, 1923, 81 y.o.   MRN: [id number] This chart was scribed for [name], MD by Zola Button, Medical Scribe. This patient was seen in room 23 and the patient's care was started at 11:08 AM.   HPI HPI Comments: [name] is a 81 y.o. female with a hx of DM and hypertension who presents to the [facility] for a follow-up. Patient has been feeling fatigued, possibly attributed to the pericardial effusion. She has been waking up 4-5 times a week due to right flank pain; she thinks a thiamine injection would help. She used to take thiamine injections in the past. Patient states she had lab work done elsewhere previously that showed normal thiamine levels.  PSHx includes cholecystectomy.  She had her eyes examined about a month ago.  Review of Systems  Constitutional: Positive for fatigue.  Genitourinary: Positive for flank pain.       Objective:   Physical Exam CONSTITUTIONAL: Well developed/well nourished HEAD: Normocephalic/atraumatic EYES: EOM/PERRL ENMT: Mucous membranes moist NECK: supple no meningeal signs SPINE: entire spine nontender CV heart sounds sound regular with a gallop rhythm. LUNGS: Lungs are clear to auscultation bilaterally, no apparent distress there is some decreased breath sounds in the right base ABDOMEN: soft, nontender, no rebound or guarding GU: no cva tenderness NEURO: Pt is awake/alert, moves all extremitiesx4 EXTREMITIES: pulses normal, full ROM SKIN: warm, color normal PSYCH: no abnormalities of mood noted  [facility] reading (PRIMARY) by  Dr. Everlene Farrier there is a huge pericardial effusion please compare to previous   Results for orders placed or performed in visit on 07/17/14  POCT glucose (manual entry)  Result Value Ref Range   POC Glucose 183 (A) 70 - 99 mg/dl  POCT glycosylated hemoglobin (Hb A1C)  Result Value Ref Range   Hemoglobin A1C 6.8    Results for  orders placed or performed in visit on 07/17/14  POCT glucose (manual entry)  Result Value Ref Range   POC Glucose 183 (A) 70 - 99 mg/dl  POCT glycosylated hemoglobin (Hb A1C)  Result Value Ref Range   Hemoglobin A1C 6.8   POCT urinalysis dipstick  Result Value Ref Range   Color, UA Light Yellow    Clarity, UA Clear    Glucose, UA 250    Bilirubin, UA Negative    Ketones, UA Negative    Spec Grav, UA 1.010    Blood, UA Small    pH, UA 6.5    Protein, UA Negative    Urobilinogen, UA 0.2    Nitrite, UA Negative    Leukocytes, UA Negative Negative  POCT UA - Microscopic Only  Result Value Ref Range   WBC, Ur, HPF, POC 0-2    RBC, urine, microscopic 1-5    Bacteria, U Microscopic Negative    Mucus, UA Negative    Epithelial cells, urine per micros negative    Crystals, Ur, HPF, POC Negative    Casts, Ur, LPF, POC negative    Yeast, UA Negative       Assessment & Plan:    Patient She did have 1-5 red cells. She declined to have an ultrasound done of her kidney. She has   1-5 red cells which also could be related to her flank pain a huge pericardial effusion. She has right-sided flank pain I think may be related to her effusion. She is concerned about liver tests and a  comprehensive metabolic panel was done today. I declined to give her a thiamine shot but told her I would check a thiamine level.I personally performed the services described in this documentation, which was scribed in my presence. The recorded information has been reviewed and is accurate.  Nena [name], MD

## 2014-07-21 LAB — VITAMIN B1: VITAMIN B1 (THIAMINE): 27 nmol/L (ref 8–30)

## 2014-07-23 LAB — POCT INR: INR: 2.7

## 2014-07-24 ENCOUNTER — Encounter: Payer: Self-pay | Admitting: Family Medicine

## 2014-07-24 ENCOUNTER — Ambulatory Visit (INDEPENDENT_AMBULATORY_CARE_PROVIDER_SITE_OTHER): Payer: Medicare Other | Admitting: Pharmacist Clinician (PhC)/ Clinical Pharmacy Specialist

## 2014-07-24 DIAGNOSIS — I482 Chronic atrial fibrillation, unspecified: Secondary | ICD-10-CM

## 2014-07-24 DIAGNOSIS — Z7901 Long term (current) use of anticoagulants: Secondary | ICD-10-CM

## 2014-07-24 DIAGNOSIS — I6310 Cerebral infarction due to embolism of unspecified precerebral artery: Secondary | ICD-10-CM

## 2014-08-07 LAB — PROTIME-INR

## 2014-08-07 LAB — POCT INR: INR: 2.6

## 2014-08-08 ENCOUNTER — Ambulatory Visit (INDEPENDENT_AMBULATORY_CARE_PROVIDER_SITE_OTHER): Payer: Medicare Other | Admitting: Pharmacist Clinician (PhC)/ Clinical Pharmacy Specialist

## 2014-08-08 DIAGNOSIS — I6310 Cerebral infarction due to embolism of unspecified precerebral artery: Secondary | ICD-10-CM

## 2014-08-08 DIAGNOSIS — I482 Chronic atrial fibrillation, unspecified: Secondary | ICD-10-CM

## 2014-08-08 DIAGNOSIS — Z7901 Long term (current) use of anticoagulants: Secondary | ICD-10-CM

## 2014-08-13 ENCOUNTER — Other Ambulatory Visit: Payer: Self-pay | Admitting: Cardiovascular Disease

## 2014-08-13 NOTE — Telephone Encounter (Signed)
Rx has been sent to the pharmacy electronically. ° °

## 2014-08-21 ENCOUNTER — Encounter: Payer: Medicare Other | Admitting: *Deleted

## 2014-08-21 LAB — POCT INR: INR: 1.9

## 2014-08-22 ENCOUNTER — Ambulatory Visit (INDEPENDENT_AMBULATORY_CARE_PROVIDER_SITE_OTHER): Payer: Medicare Other | Admitting: Pharmacist Clinician (PhC)/ Clinical Pharmacy Specialist

## 2014-08-22 DIAGNOSIS — I482 Chronic atrial fibrillation, unspecified: Secondary | ICD-10-CM

## 2014-08-22 DIAGNOSIS — Z7901 Long term (current) use of anticoagulants: Secondary | ICD-10-CM

## 2014-08-22 DIAGNOSIS — I6310 Cerebral infarction due to embolism of unspecified precerebral artery: Secondary | ICD-10-CM

## 2014-08-23 ENCOUNTER — Telehealth: Payer: Self-pay | Admitting: Cardiovascular Disease

## 2014-08-23 NOTE — Telephone Encounter (Signed)
Please call,wants to find out if she needs to see the doctor. She says her heart rate is different.

## 2014-08-23 NOTE — Telephone Encounter (Signed)
Can we help patient regarding troubleshooting device download? She is not sure if her transmission went through.

## 2014-08-23 NOTE — Telephone Encounter (Signed)
Called patient back - no answer when dialed.

## 2014-08-23 NOTE — Telephone Encounter (Signed)
Called patient. Re: heart rate.  States "I wanted to let you know I've been doing something that is kind of out of left field that seems to affect my pulse rate"  Notes she has had problems w/ discomfort in torso (front and around sides). Due to this, doesn't sleep well. To remedy this, she started taking thiamine 600mg  a day. Notes that in doing so, she also noticed a vast improvement in the frequency of her palpitations.  Notes she feels heart is beating normally all the time "until the thiamine is out of [her] system".  She wanted to make sure no precautions/contraindications to taking this supplement. Informed her i would route to [name] to address as Dr. Sallyanne Kuster is on vacation. Pt indicated no urgency in this. Cites no symptoms or acute concerns.  She also wanted to make sure device download from yesterday was received - I do not see this - will copy device pool on this encounter to troubleshoot.

## 2014-08-23 NOTE — Telephone Encounter (Signed)
Thiamine deficiency can lead to HF symptoms, but the deficiency is not common.  Would expect more feelings of breathing easier or less edema.  No indication that can stop palpitations.  But if she is feeling better, the thiamine has no drug interactions and is water soluble, so she's fine to take it.

## 2014-08-23 NOTE — Telephone Encounter (Signed)
Pt did not have carelink monitor plugged in; no remote received yet.   I explained to pt she has chronic A-fib and will always have an irregular heart rate. Pt strongly believes taking high doses of thiamine will regulate her pulse rate. Pt just wanted to know why her heart rate is always irregular. Pt does not have any symptoms.   Walked pt through connecting her monitor. Monitor initially had "[id number]" message (no cell signal available). I had her move closer to a window. We were still unsuccessful. Pt continually gets [phone] msg. Pt aware she can send anytime from any location as long carelink can obtain a signal. She will continue to leave her monitor plugged and will check other locations for reception then try to send a report again later.

## 2014-08-24 ENCOUNTER — Encounter: Payer: Self-pay | Admitting: Cardiovascular Disease

## 2014-08-26 ENCOUNTER — Other Ambulatory Visit: Payer: Self-pay | Admitting: Emergency Medicine

## 2014-09-04 LAB — POCT INR: INR: 2.5

## 2014-09-05 ENCOUNTER — Ambulatory Visit (INDEPENDENT_AMBULATORY_CARE_PROVIDER_SITE_OTHER): Payer: Medicare Other | Admitting: Pharmacist Clinician (PhC)/ Clinical Pharmacy Specialist

## 2014-09-05 ENCOUNTER — Encounter: Payer: Self-pay | Admitting: *Deleted

## 2014-09-05 DIAGNOSIS — Z7901 Long term (current) use of anticoagulants: Secondary | ICD-10-CM

## 2014-09-05 DIAGNOSIS — I482 Chronic atrial fibrillation, unspecified: Secondary | ICD-10-CM

## 2014-09-05 DIAGNOSIS — I6310 Cerebral infarction due to embolism of unspecified precerebral artery: Secondary | ICD-10-CM

## 2014-09-07 ENCOUNTER — Encounter: Payer: Self-pay | Admitting: Cardiovascular Disease

## 2014-09-13 ENCOUNTER — Telehealth: Payer: Self-pay | Admitting: Cardiology

## 2014-09-13 NOTE — Telephone Encounter (Signed)
Patient returned phone call.  Gave patient phone number for [employer] tech support as we were unable to troubleshoot her device over the phone.  Patient aware to call with worsening symptoms, questions, or concerns.

## 2014-09-13 NOTE — Telephone Encounter (Signed)
Attempted to call pt back about missed remote transmission on 08-21-14. Last transmission received was from 05-22-14 by a [employer]. No answer and unable to leave a message.

## 2014-09-18 LAB — POCT INR: INR: 2.8

## 2014-09-19 ENCOUNTER — Ambulatory Visit (INDEPENDENT_AMBULATORY_CARE_PROVIDER_SITE_OTHER): Payer: Medicare Other | Admitting: Pharmacist Clinician (PhC)/ Clinical Pharmacy Specialist

## 2014-09-19 DIAGNOSIS — I6310 Cerebral infarction due to embolism of unspecified precerebral artery: Secondary | ICD-10-CM

## 2014-09-19 DIAGNOSIS — I482 Chronic atrial fibrillation, unspecified: Secondary | ICD-10-CM

## 2014-09-19 DIAGNOSIS — Z7901 Long term (current) use of anticoagulants: Secondary | ICD-10-CM

## 2014-10-02 LAB — POCT INR: INR: 3.2

## 2014-10-03 ENCOUNTER — Ambulatory Visit (INDEPENDENT_AMBULATORY_CARE_PROVIDER_SITE_OTHER): Payer: Medicare Other | Admitting: Pharmacist Clinician (PhC)/ Clinical Pharmacy Specialist

## 2014-10-03 DIAGNOSIS — I6310 Cerebral infarction due to embolism of unspecified precerebral artery: Secondary | ICD-10-CM

## 2014-10-03 DIAGNOSIS — I482 Chronic atrial fibrillation, unspecified: Secondary | ICD-10-CM

## 2014-10-03 DIAGNOSIS — Z7901 Long term (current) use of anticoagulants: Secondary | ICD-10-CM

## 2014-10-05 ENCOUNTER — Other Ambulatory Visit: Payer: Self-pay | Admitting: Emergency Medicine

## 2014-10-16 LAB — POCT INR: INR: 4

## 2014-10-17 ENCOUNTER — Ambulatory Visit (INDEPENDENT_AMBULATORY_CARE_PROVIDER_SITE_OTHER): Payer: Medicare Other | Admitting: Pharmacist Clinician (PhC)/ Clinical Pharmacy Specialist

## 2014-10-17 DIAGNOSIS — I6310 Cerebral infarction due to embolism of unspecified precerebral artery: Secondary | ICD-10-CM

## 2014-10-17 DIAGNOSIS — I482 Chronic atrial fibrillation, unspecified: Secondary | ICD-10-CM

## 2014-10-17 DIAGNOSIS — Z7901 Long term (current) use of anticoagulants: Secondary | ICD-10-CM

## 2014-10-30 ENCOUNTER — Encounter: Payer: Self-pay | Admitting: Emergency Medicine

## 2014-10-30 ENCOUNTER — Ambulatory Visit (INDEPENDENT_AMBULATORY_CARE_PROVIDER_SITE_OTHER): Payer: Medicare Other | Admitting: Pharmacist Clinician (PhC)/ Clinical Pharmacy Specialist

## 2014-10-30 DIAGNOSIS — I482 Chronic atrial fibrillation, unspecified: Secondary | ICD-10-CM

## 2014-10-30 DIAGNOSIS — Z7901 Long term (current) use of anticoagulants: Secondary | ICD-10-CM

## 2014-10-30 DIAGNOSIS — I6310 Cerebral infarction due to embolism of unspecified precerebral artery: Secondary | ICD-10-CM

## 2014-10-30 LAB — POCT INR: INR: 3.2

## 2014-11-13 LAB — POCT INR: INR: 3

## 2014-11-13 LAB — PROTIME-INR: INR: 3 — AB (ref 0.9–1.1)

## 2014-11-16 ENCOUNTER — Ambulatory Visit (INDEPENDENT_AMBULATORY_CARE_PROVIDER_SITE_OTHER): Payer: Medicare Other | Admitting: Pharmacist Clinician (PhC)/ Clinical Pharmacy Specialist

## 2014-11-16 DIAGNOSIS — I482 Chronic atrial fibrillation, unspecified: Secondary | ICD-10-CM

## 2014-11-16 DIAGNOSIS — Z7901 Long term (current) use of anticoagulants: Secondary | ICD-10-CM

## 2014-11-16 DIAGNOSIS — I6310 Cerebral infarction due to embolism of unspecified precerebral artery: Secondary | ICD-10-CM

## 2014-11-23 ENCOUNTER — Ambulatory Visit (INDEPENDENT_AMBULATORY_CARE_PROVIDER_SITE_OTHER): Payer: Medicare Other | Admitting: Pharmacist Clinician (PhC)/ Clinical Pharmacy Specialist

## 2014-11-23 DIAGNOSIS — I482 Chronic atrial fibrillation, unspecified: Secondary | ICD-10-CM

## 2014-11-23 DIAGNOSIS — Z7901 Long term (current) use of anticoagulants: Secondary | ICD-10-CM

## 2014-11-23 DIAGNOSIS — I6310 Cerebral infarction due to embolism of unspecified precerebral artery: Secondary | ICD-10-CM

## 2014-11-23 LAB — POCT INR: INR: 2.4

## 2014-11-27 ENCOUNTER — Other Ambulatory Visit: Payer: Self-pay | Admitting: *Deleted

## 2014-11-27 ENCOUNTER — Telehealth: Payer: Self-pay | Admitting: Cardiovascular Disease

## 2014-11-27 DIAGNOSIS — R600 Localized edema: Secondary | ICD-10-CM

## 2014-11-27 DIAGNOSIS — I3139 Other pericardial effusion (noninflammatory): Secondary | ICD-10-CM

## 2014-11-27 DIAGNOSIS — I313 Pericardial effusion (noninflammatory): Secondary | ICD-10-CM

## 2014-11-27 DIAGNOSIS — R0602 Shortness of breath: Secondary | ICD-10-CM

## 2014-11-27 LAB — POCT INR: INR: 3.6

## 2014-11-27 NOTE — Telephone Encounter (Signed)
Reviewed with Dr Gena Fray Dr Debara Pickett:  Increase torsemide to 40mg  bid for 3 days, then decrease back to usual dose of torsemide 40mg  daily. Increase KCL to 20 mEq bid for 3 days, then decrease back to usual dose of KCL 20 mEq daily. Weigh daily, call if weight does not go down after increasing torsemide. BMET/BNP in 1 week, limited echo for pericardial effusion, follow up office visit in 2 weeks with Dr Croitoru/PA/NP/Flex.  I attempted to contact pt and did not get an answer.

## 2014-11-27 NOTE — Telephone Encounter (Signed)
Pt advised,verbalized understanding. 

## 2014-11-27 NOTE — Telephone Encounter (Signed)
Pt c/o swelling: STAT is pt has developed SOB within 24 hours  1. How long have you been experiencing swelling? Several Weeks   2. Where is the swelling located? Both feet  3.  Are you currently taking a "fluid pill"? Yes   4.  Are you currently SOB? Yes   5.  Have you traveled recently? Did not say

## 2014-11-27 NOTE — Telephone Encounter (Signed)
Pt states that her feet and legs are swollen to her knees, usually they are swollen about half way up her legs. Pt states that she has gained 8 pounds in the last few months, she is more short of breath and has no energy. Pt states she should have called sooner but has been putting off calling. Pt states she does not feel her heart rate is fast and her BP is Ok. Pt confirmed she is taking torsemide 2 of a 20mg  tablet (40mg )  daily.

## 2014-11-28 ENCOUNTER — Ambulatory Visit (INDEPENDENT_AMBULATORY_CARE_PROVIDER_SITE_OTHER): Payer: Medicare Other | Admitting: Pharmacist Clinician (PhC)/ Clinical Pharmacy Specialist

## 2014-11-28 DIAGNOSIS — I482 Chronic atrial fibrillation, unspecified: Secondary | ICD-10-CM

## 2014-11-28 DIAGNOSIS — Z7901 Long term (current) use of anticoagulants: Secondary | ICD-10-CM

## 2014-11-28 DIAGNOSIS — I6310 Cerebral infarction due to embolism of unspecified precerebral artery: Secondary | ICD-10-CM

## 2014-12-04 ENCOUNTER — Other Ambulatory Visit: Payer: Self-pay | Admitting: Cardiovascular Disease

## 2014-12-04 LAB — BASIC METABOLIC PANEL
BUN: 18 mg/dL (ref 7–25)
CHLORIDE: 96 mmol/L — AB (ref 98–110)
CO2: 36 mmol/L — AB (ref 20–31)
CREATININE: 1.01 mg/dL — AB (ref 0.60–0.88)
Calcium: 10 mg/dL (ref 8.6–10.4)
Glucose, Bld: 92 mg/dL (ref 65–99)
Potassium: 3.6 mmol/L (ref 3.5–5.3)
Sodium: 141 mmol/L (ref 135–146)

## 2014-12-05 LAB — DIGOXIN LEVEL: Digoxin Level: 1.3 ug/L (ref 0.8–2.0)

## 2014-12-06 ENCOUNTER — Ambulatory Visit (HOSPITAL_COMMUNITY): Payer: Medicare Other | Attending: Cardiology

## 2014-12-06 ENCOUNTER — Other Ambulatory Visit: Payer: Self-pay | Admitting: Internal Medicine

## 2014-12-06 ENCOUNTER — Other Ambulatory Visit: Payer: Self-pay

## 2014-12-06 DIAGNOSIS — I313 Pericardial effusion (noninflammatory): Secondary | ICD-10-CM | POA: Insufficient documentation

## 2014-12-06 DIAGNOSIS — R600 Localized edema: Secondary | ICD-10-CM

## 2014-12-06 DIAGNOSIS — R0602 Shortness of breath: Secondary | ICD-10-CM | POA: Diagnosis present

## 2014-12-06 DIAGNOSIS — I3139 Other pericardial effusion (noninflammatory): Secondary | ICD-10-CM

## 2014-12-06 DIAGNOSIS — I319 Disease of pericardium, unspecified: Secondary | ICD-10-CM

## 2014-12-06 DIAGNOSIS — I059 Rheumatic mitral valve disease, unspecified: Secondary | ICD-10-CM | POA: Insufficient documentation

## 2014-12-06 DIAGNOSIS — I517 Cardiomegaly: Secondary | ICD-10-CM | POA: Diagnosis not present

## 2014-12-07 ENCOUNTER — Telehealth: Payer: Self-pay | Admitting: Cardiovascular Disease

## 2014-12-10 ENCOUNTER — Encounter: Payer: Medicare Other | Admitting: Cardiothoracic Surgery

## 2014-12-10 NOTE — Telephone Encounter (Signed)
Closed encounter °

## 2014-12-11 ENCOUNTER — Institutional Professional Consult (permissible substitution) (INDEPENDENT_AMBULATORY_CARE_PROVIDER_SITE_OTHER): Payer: Medicare Other | Admitting: Cardiothoracic Surgery

## 2014-12-11 ENCOUNTER — Other Ambulatory Visit: Payer: Self-pay | Admitting: Emergency Medicine

## 2014-12-11 ENCOUNTER — Encounter: Payer: Self-pay | Admitting: Cardiothoracic Surgery

## 2014-12-11 VITALS — BP 140/70 | HR 80 | Resp 20 | Ht 64.0 in | Wt 160.0 lb

## 2014-12-11 DIAGNOSIS — I6310 Cerebral infarction due to embolism of unspecified precerebral artery: Secondary | ICD-10-CM | POA: Diagnosis not present

## 2014-12-11 DIAGNOSIS — I313 Pericardial effusion (noninflammatory): Secondary | ICD-10-CM

## 2014-12-11 DIAGNOSIS — I319 Disease of pericardium, unspecified: Secondary | ICD-10-CM | POA: Diagnosis not present

## 2014-12-11 DIAGNOSIS — I3139 Other pericardial effusion (noninflammatory): Secondary | ICD-10-CM

## 2014-12-11 LAB — POCT INR
INR: 2.7
INR: 2.4

## 2014-12-11 NOTE — Progress Notes (Signed)
[name]       [place] [address]             [phone]                    [name] [id number] Date of Birth: [birth year]  Referring: Sanda Klein, MD Primary Care: [name] Reichmann, MD  Chief Complaint:    Chief Complaint  Patient presents with  . Pericardial Effusion    Surgical eval, ECHO 12/06/14    History of Present Illness:    [name] 81 y.o. female is seen in the office  today for a large chronic pericardial effusion, with signs and symptoms of congestive heart failure including shortness of breath with exertion and significant bilateral lower extremity edema. The patient notes shortness of breath with exertion around her house but remains fairly active and takes care of her invalid sister. She has a pericardial effusion of uncertain etiology that was first identified in 2012. It was small to moderate on the echocardiogram performed in 07/02/2011 and probably unchanged but described as moderate on the echocardiogram performed 07/28/2011, moderate to large in August 2014. Never showed signs of tamponade on. Recent follow-up CT scan done November 10 shows the effusion increasing in size.    Patient presented for pacemaker follow-up in the cardiology office, roughly 3 months after her generator change out and placement of a new right ventricular lead for poor sensing. She has permanent atrial fibrillation with periods of slow ventricular response. Her current pacemaker is at beginning of life, Medtronic [place] implanted December 2015. Estimated generator longevity is 9.5 years. There is roughly 50% ventricular pacing. No episodes of high ventricular rate are recorded.  She is on chronic warfarin anticoagulation has not had any recent bleeding complications. She does have a history of embolic stroke during a period of subtherapeutic anticoagulation in May 2013, but has also had gastrointestinal bleeding from  arteriovenous malformations when her anticoagulation with supratherapeutic.   Current Activity/ Functional Status:  Patient is independent with mobility/ambulation, transfers, ADL's, IADL's.   Zubrod Score: At the time of surgery this patient's most appropriate activity status/level should be described as: []     0    Normal activity, no symptoms [x]     1    Restricted in physical strenuous activity but ambulatory, able to do out light work []     2    Ambulatory and capable of self care, unable to do work activities, up and about               >50 % of waking hours                              []     3    Only limited self care, in bed greater than 50% of waking hours []     4    Completely disabled, no self care, confined to bed or chair []     5    Moribund   Past Medical History  Diagnosis Date  . Pericardial effusion     Chronic. Last echo in April of 2012.   . Atrial fibrillation (HCC)     Chronic  . Chronic anticoagulation     on Coumadin  . History of GI bleed     once treated with Fe infusion  . HTN (hypertension)   . Pacemaker  placed for tachybrady.   . Valvular heart disease   . Obesity   . LVH (left ventricular hypertrophy)   . Thrombophlebitis 1967    associated with pregnancy  . Diverticulosis   . Heart murmur   . Osteoporosis   . Hx of echocardiogram 07/28/2011    EF>55% small to moderate and is mostly located posteriorly. there is no overt evidence of tamponade although subtle diastolic collapse of the right ventricle.  . Sleep apnea     mild-no cpap  . History of blood transfusion "several times"  . CHF (congestive heart failure) ([place])   . Pneumonia ~ 1986  . Positive TB test   . Anemia   . Iron deficiency anemia     "I've had an iron transfusion"  . Stroke [facility]) April 2013    right frontal  . Arthritis   . Tachycardia-bradycardia syndrome [facility])     s/p Medtronic [place]  model number [id number], serial number [id number] H 12/2013    Past Surgical  History  Procedure Laterality Date  . Breast lumpectomy Bilateral     negative for cancer  . Esophagogastroduodenoscopy  05/19/2011    Procedure: ESOPHAGOGASTRODUODENOSCOPY (EGD);  Surgeon: Lear Ng, MD;  Location: [facility] ENDOSCOPY;  Service: Endoscopy;  Laterality: N/A;  doctor aware of inr   will try to be here no later than 230  . Cholecystectomy  2005  . Esophagogastroduodenoscopy  06/22/2011    Procedure: ESOPHAGOGASTRODUODENOSCOPY (EGD);  Surgeon: Arta Silence, MD;  Location: [facility] ENDOSCOPY;  Service: Endoscopy;  Laterality: N/A;  Check PT/INR in am  . Givens capsule study  06/23/2011    Procedure: GIVENS CAPSULE STUDY;  Surgeon: Arta Silence, MD;  Location: [facility] ENDOSCOPY;  Service: Endoscopy;  Laterality: N/A;  . Colonoscopy  08/11/2011    Procedure: COLONOSCOPY;  Surgeon: Jeryl Columbia, MD;  Location: [facility] ENDOSCOPY;  Service: Endoscopy;  Laterality: N/A;  . Hot hemostasis  08/11/2011    Procedure: HOT HEMOSTASIS (ARGON PLASMA COAGULATION/BICAP);  Surgeon: Jeryl Columbia, MD;  Location: [facility] ENDOSCOPY;  Service: Endoscopy;  Laterality: N/A;  . Mass excision Left 09/14/2013    Procedure: EXCISION MASS LEFT WRIST;  Surgeon: Leanora Cover, MD;  Location: [facility];  Service: Orthopedics;  Laterality: Left;  . Pacemaker generator change  01/02/2014    w/lead replacement  . Tonsillectomy and adenoidectomy  1950  . Insert / replace / remove pacemaker  2001    Last generator in 2006; interrogated Dec 2012  . Cataract extraction w/ intraocular lens  implant, bilateral Bilateral   . Lipoma excision Left 07/2013    wrist  . Pacemaker generator change N/A 01/02/2014    Procedure: PACEMAKER GENERATOR CHANGE;  Surgeon: Sanda Klein, MD;  Location: [facility] CATH LAB;  Service: Cardiovascular;  Laterality: N/A;  . Lead revision N/A 01/02/2014    Procedure: LEAD REVISION;  Surgeon: Sanda Klein, MD;  Location: [place] CATH LAB;  Service: Cardiovascular;  Laterality: N/A;    Family History    Problem Relation Age of Onset  . Stroke Mother   . Stroke Father   . Pneumonia Father     Social History   Social History  . Marital Status: Widowed    Spouse Name: N/A  . Number of Children: N/A  . Years of Education: N/A   Occupational History  . Not on file.   Social History Main Topics  . Smoking status: Never Smoker   . Smokeless tobacco: Never Used  . Alcohol Use: 0.0 oz/week  0 Standard drinks or equivalent per week     Comment: 01/02/2014 "I'll have a drink a few times/year"  . Drug Use: No  . Sexual Activity: No   Other Topics Concern  . Not on file   Social History Narrative    History  Smoking status  . Never Smoker   Smokeless tobacco  . Never Used    History  Alcohol Use  . 0.0 oz/week  . 0 Standard drinks or equivalent per week    Comment: 01/02/2014 "I'll have a drink a few times/year"     Allergies  Allergen Reactions  . Iodinated Diagnostic Agents Hives    HIVES 15MIN S/P IV CONTRAST INJECTION,WILL NEED 13 HR PREP FOR FUTURE INJECTIONS, ok s/p 50mg  po benadryl//a.calhoun  . Anti-Inflammatory Enzyme [Nutritional Supplements]     Retains fluids and headaches  . Arthrotec [Diclofenac-Misoprostol] Other (See Comments)    unknown  . Biaxin [Clarithromycin] Other (See Comments)    unknown  . Plavix [Clopidogrel Bisulfate] Other (See Comments)    unknown  . Spironolactone     Hair loss    Current Outpatient Prescriptions  Medication Sig Dispense Refill  . codeine 15 MG tablet Take 1 tablet (15 mg total) by mouth every 6 (six) hours as needed. 30 tablet 0  . DIGITEK 125 MCG tablet TAKE 1 TABLET(S) BY MOUTH DAILY 30 tablet 5  . multivitamin (THERAGRAN) per tablet Take 1 tablet by mouth daily.      . potassium chloride (K-DUR,KLOR-CON) 10 MEQ tablet take 2 tablets by mouth once daily WHEN YOU TAKE TORSEMIDE 180 tablet 2  . Thiamine HCl (VITAMIN B-1 PO) Take 1 tablet by mouth daily.    Marland Kitchen torsemide (DEMADEX) 20 MG tablet Take 2 tablets (40  mg total) by mouth daily. 60 tablet 10  . verapamil (CALAN-SR) 180 MG CR tablet Take 1 tablet (180 mg total) by mouth at bedtime. 30 tablet 10  . warfarin (COUMADIN) 5 MG tablet Take 0.5-1 tablets (2.5-5 mg total) by mouth daily. Take 2.5mg  Mon and Fri and 5mg  all other days as directed per INR. 90 tablet 3   No current facility-administered medications for this visit.      Review of Systems:     Cardiac Review of Systems: Y or N  Chest Pain [  n  ]  Resting SOB Blue.Reese   ] Exertional SOB  [ y ]  Orthopnea [ y   Pedal Edema [ severe ]    Palpitations [ y ] Syncope  [ n ]   Presyncope [ n  ]  General Review of Systems: [Y] = yes [  ]=no Constitional: recent weight change [  ];  Wt loss over the last 3 months [   ] anorexia [  ]; fatigue [  ]; nausea [  ]; night sweats [  ]; fever [  ]; or chills [  ];          Dental: poor dentition[  n]; Last Dentist visit:   Eye : blurred vision [  ]; diplopia [   ]; vision changes [  ];  Amaurosis fugax[  ]; Resp: cough [  ];  wheezing[  ];  hemoptysis[  ]; shortness of breath[  ]; paroxysmal nocturnal dyspnea[  ]; dyspnea on exertion[  ]; or orthopnea[  ];  GI:  gallstones[  ], vomiting[  ];  dysphagia[  ]; melena[  ];  hematochezia [  ]; heartburn[  ];   Hx of  Colonoscopy[  ]; GU: kidney stones [  ]; hematuria[  ];   dysuria [  ];  nocturia[  ];  history of     obstruction [  ]; urinary frequency [  ]             Skin: rash, swelling[  ];, hair loss[  ];  peripheral edema[  ];  or itching[  ]; Musculosketetal: myalgias[  ];  joint swelling[  ];  joint erythema[  ];  joint pain[  ];  back pain[  ];  Heme/Lymph: bruising[  ];  bleeding[  ];  anemia[  ];  Neuro: TIA[  ];  headaches[  ];  stroke[y  ];  vertigo[  ];  seizures[  ];   paresthesias[  ];  difficulty walking[ y ];  Psych:depression[  ]; anxiety[  ];  Endocrine: diabetes[  ];  thyroid dysfunction[  ];  Immunizations: Flu up to date [?  ]; Pneumococcal up to date [  ];  Other:  Physical Exam: BP  140/70 mmHg  Pulse 80  Resp 20  Ht 5\' 4"  (1.626 m)  Wt 160 lb (72.576 kg)  BMI 27.45 kg/m2  SpO2 92%  PHYSICAL EXAMINATION: General appearance: alert, cooperative, appears stated age and no distress Head: Normocephalic, without obvious abnormality, atraumatic, She does have prominent jugular venous distention Neck: no adenopathy, no carotid bruit, supple, symmetrical, trachea midline and thyroid not enlarged, symmetric, no tenderness/mass/nodules Lymph nodes: Cervical, supraclavicular, and axillary nodes normal. Resp: diminished breath sounds bibasilar Back: symmetric, no curvature. ROM normal. No CVA tenderness. Cardio: irregularly irregular rhythm and systolic murmur: early systolic 3/6, crescendo at lower left sternal border GI: soft, non-tender; bowel sounds normal; no masses,  no organomegaly Extremities: edema 3+ pitting edema from the ankles to the knees bilaterally Neurologic: Grossly normal  Diagnostic Studies & Laboratory data:     Recent Radiology Findings:  No results found.    Recent Lab Findings: Lab Results  Component Value Date   WBC 4.9 03/20/2014   HGB 13.5 03/20/2014   HCT 39.1 03/20/2014   PLT 233 03/20/2014   GLUCOSE 182* 07/17/2014   CHOL 160 08/03/2013   TRIG 114 08/03/2013   HDL 37* 08/03/2013   LDLCALC 100* 08/03/2013   ALT 19 07/17/2014   AST 18 07/17/2014   NA 140 07/17/2014   K 3.7 07/17/2014   CL 99 07/17/2014   CREATININE 1.10 07/17/2014   BUN 21 07/17/2014   CO2 31 07/17/2014   TSH 2.154 12/28/2013   INR 3.6 11/27/2014   HGBA1C 6.8 07/17/2014   ECHO: LV EF: 60% -  65%  ------------------------------------------------------------------- Indications:   SOB (R06.02).  ------------------------------------------------------------------- Study Conclusions  - Left ventricle: The cavity size was normal. There was mild concentric hypertrophy. Systolic function was normal. The estimated ejection fraction was in the range of 60%  to 65%. Wall motion was normal; there were no regional wall motion abnormalities. - Aortic valve: Cusp separation was reduced. - Mitral valve: Moderately calcified annulus. - Left atrium: The atrium was severely dilated. - Right ventricle: The cavity size was mildly dilated. Wall thickness was normal. - Right atrium: The atrium was severely dilated. - Pulmonary arteries: Systolic pressure was mildly increased. PA peak pressure: 33 mm Hg (S). - Pericardium, extracardiac: A large pericardial effusion was identified mostly posterior to the heart (3cm), and also along the right ventricular free wall, and the right atrial free wall. There was no evidence of hemodynamic compromise.  Impressions:  - Prior  echocardiogram, 24mm posterior, 07/05/14.  Transthoracic echocardiography. M-mode, limited 2D, limited spectral Doppler, and color Doppler. Birthdate: Patient birthdate: Oct 22, 1923. Age: Patient is 81 yr old. Sex: Gender: female.  BMI: 27.6 kg/m^2. Blood pressure:   142/76 Patient status: Outpatient. Study date: Study date: 12/06/2014. Study time: 03:20 PM. Location: [place] [facility]  -------------------------------------------------------------------  ------------------------------------------------------------------- Left ventricle: The cavity size was normal. There was mild concentric hypertrophy. Systolic function was normal. The estimated ejection fraction was in the range of 60% to 65%. Wall motion was normal; there were no regional wall motion abnormalities.  ------------------------------------------------------------------- Aortic valve:  Trileaflet; moderately thickened, moderately calcified leaflets. Cusp separation was reduced. Doppler: Transvalvular velocity was within the normal range. There was no stenosis. There was no regurgitation.  ------------------------------------------------------------------- Aorta: Aortic root: The  aortic root was normal in size.  ------------------------------------------------------------------- Mitral valve:  Moderately calcified annulus. Mobility was not restricted. Doppler: Transvalvular velocity was within the normal range. There was no evidence for stenosis. There was no regurgitation.  ------------------------------------------------------------------- Left atrium: The atrium was severely dilated.  ------------------------------------------------------------------- Right ventricle: The cavity size was mildly dilated. Wall thickness was normal. Pacer wire or catheter noted in right ventricle. Systolic function was normal.  ------------------------------------------------------------------- Pulmonic valve:  Doppler: Transvalvular velocity was within the normal range. There was no evidence for stenosis. There was mild regurgitation.  ------------------------------------------------------------------- Tricuspid valve:  Structurally normal valve.  Doppler: Transvalvular velocity was within the normal range. There was mild regurgitation.  ------------------------------------------------------------------- Pulmonary artery:  The main pulmonary artery was normal-sized. Systolic pressure was mildly increased.  ------------------------------------------------------------------- Right atrium: The atrium was severely dilated. Pacer wire or catheter noted in right atrium.  ------------------------------------------------------------------- Pericardium: A large pericardial effusion was identified mostly posterior to the heart (3cm), and also along the right ventricular free wall, and the right atrial free wall. There was no evidence of hemodynamic compromise.  ------------------------------------------------------------------- Systemic veins: Inferior vena cava: The vessel was normal in  size.  ------------------------------------------------------------------- Measurements  Left ventricle               Value    Reference LV ID, ED, PLAX chordal           44.8 mm   43 - 52 LV ID, ES, PLAX chordal           34.1 mm   23 - 38 LV fx shortening, PLAX chordal   (L)   24  %   >=29 LV PW thickness, ED             12.4 mm   --------- IVS/LV PW ratio, ED             1.14     <=1.3  Ventricular septum             Value    Reference IVS thickness, ED              14.1 mm   ---------  Aorta                    Value    Reference Aortic root ID, ED             33  mm   ---------  Left atrium                 Value    Reference LA ID, A-P, ES               52  mm   --------- LA  ID/bsa, A-P           (H)   2.83 cm/m^2 <=2.2  Mitral valve                Value    Reference Mitral deceleration time          197  ms   150 - 230  Pulmonary arteries             Value    Reference PA pressure, S, DP         (H)   33  mm Hg <=30  Tricuspid valve               Value    Reference Tricuspid regurg peak velocity       273  cm/s  --------- Tricuspid peak RV-RA gradient        30  mm Hg --------- Tricuspid maximal regurg velocity,     273  cm/s  --------- PISA  Systemic veins               Value    Reference Estimated CVP                3   mm Hg ---------  Right ventricle               Value    Reference RV pressure, S, DP         (H)   33  mm Hg <=30  Legend: (L) and (H) mark values outside specified reference  range.  ------------------------------------------------------------------- Prepared and Electronically Authenticated by  Candee Furbish, M.D. 2016-11-10T16:43:27        Assessment / Plan:   Chronic large appearing pericardial effusion on patient with progressive heart failure symptoms now class II with chronic atrial fibrillation on Coumadin. Agree with Dr. Loletha Grayer with the patient's increasing pericardial effusion that would be of benefit to perform a pericardial window under nonemergency conditions when her anticoagulation can be handled appropriately both for relief of symptoms and to obtain a diagnosis, if possible. I discussed with the patient the risks and options involved with pericardial drainage in the expected time in the hospital. She is willing to proceed but would like to wait until after Thanksgiving. We will coordinate with Dr. Loletha Grayer office And Coumadin clinic to transition her off Coumadin onto Lovenox because of her previous history of atrial fibrillation and stroke.      I  spent 40 minutes counseling the patient face to face and 50% or more the  time was spent in counseling and coordination of care. The total time spent in the appointment was 60 minutes.  Grace Isaac MD      [address] [place] [address] Office [phone]   Beeper [phone]  12/11/2014 3:19 PM

## 2014-12-12 ENCOUNTER — Ambulatory Visit: Payer: Medicare Other | Admitting: Physician Assistant

## 2014-12-12 ENCOUNTER — Other Ambulatory Visit: Payer: Self-pay | Admitting: *Deleted

## 2014-12-12 ENCOUNTER — Other Ambulatory Visit: Payer: Self-pay | Admitting: Physician Assistant

## 2014-12-12 ENCOUNTER — Telehealth: Payer: Self-pay | Admitting: Cardiovascular Disease

## 2014-12-12 ENCOUNTER — Ambulatory Visit (INDEPENDENT_AMBULATORY_CARE_PROVIDER_SITE_OTHER): Payer: Medicare Other | Admitting: Pharmacist Clinician (PhC)/ Clinical Pharmacy Specialist

## 2014-12-12 DIAGNOSIS — I313 Pericardial effusion (noninflammatory): Secondary | ICD-10-CM

## 2014-12-12 DIAGNOSIS — I3139 Other pericardial effusion (noninflammatory): Secondary | ICD-10-CM

## 2014-12-12 DIAGNOSIS — Z7901 Long term (current) use of anticoagulants: Secondary | ICD-10-CM

## 2014-12-12 DIAGNOSIS — I482 Chronic atrial fibrillation, unspecified: Secondary | ICD-10-CM

## 2014-12-12 DIAGNOSIS — I6310 Cerebral infarction due to embolism of unspecified precerebral artery: Secondary | ICD-10-CM

## 2014-12-12 NOTE — Telephone Encounter (Signed)
States she woke up this AM and was SOB with a little discomfort in her back.  Did not want the surgical procedure done before Thanksgiving (pericardial window) because of her family.  She since has urinated a lot and feels much better and still wants to wait until 11/29.  Instructed if she has similar problems to call Dr. Everrett Coombe office and schedule sooner.  Dr. Servando Snare told her our office would take care of the coumadin dosing around the surgery.  Will send this info to [place], [name].

## 2014-12-12 NOTE — Telephone Encounter (Signed)
Notified patient she will need to have lovenox bridging. Therefore, she will need to come in tomorrow or Friday the latest.  Patient agreed on appt tomorrow 12/13/14 at 3:30pm.  Erasmo Downer, [name] notified.

## 2014-12-12 NOTE — Telephone Encounter (Signed)
Please call,concerning the procedure she is going to have.Pt asked to speak to you.

## 2014-12-13 ENCOUNTER — Ambulatory Visit (INDEPENDENT_AMBULATORY_CARE_PROVIDER_SITE_OTHER): Payer: Medicare Other | Admitting: Pharmacist Clinician (PhC)/ Clinical Pharmacy Specialist

## 2014-12-13 DIAGNOSIS — I480 Paroxysmal atrial fibrillation: Secondary | ICD-10-CM

## 2014-12-13 MED ORDER — ENOXAPARIN SODIUM 100 MG/ML ~~LOC~~ SOLN: 100.0000 mg | SUBCUTANEOUS | Status: DC

## 2014-12-13 NOTE — Patient Instructions (Signed)
Enoxaprin Dosing Schedule  Enoxparin dose: 100 mg daily  Date  Warfarin Dose (evenings) Enoxaprin Dose  11-23 6 5  mg (1 tablet)   11-24 5 0   11-25 4 0 8 am  11-26 3 0 8 am  11-27 2 0 8 am  11-28 1 0 8 am  11-29 Procedure 2.5 mg (1/2 tab)   11-30 1 7.5 mg (1.5 tabs) 8 am  12-1 2 7.5 mg (1.5 tabs) 8 am  12-2 3 5  mg (1 tab) 8 am  12-3 4 5  mg (1 tab) 8 am  12-4 5 5  mg (1 tab) 8 am  12-5 6 Re-check INR

## 2014-12-13 NOTE — Progress Notes (Signed)
Patient in office for lovenox bridge information.  She is to have a subxyphoid pericardial window on Nov 29.  Patient unsure how long she will be hospitalized after procedure.  Patient is retired Therapist, sports, knows how to give injections and understands directions today.

## 2014-12-16 ENCOUNTER — Ambulatory Visit (INDEPENDENT_AMBULATORY_CARE_PROVIDER_SITE_OTHER): Payer: Medicare Other | Admitting: Internal Medicine

## 2014-12-16 VITALS — BP 130/60 | HR 80 | Temp 98.3°F | Resp 16 | Ht 65.0 in | Wt 166.0 lb

## 2014-12-16 DIAGNOSIS — Z23 Encounter for immunization: Secondary | ICD-10-CM | POA: Diagnosis not present

## 2014-12-16 DIAGNOSIS — B029 Zoster without complications: Secondary | ICD-10-CM

## 2014-12-16 DIAGNOSIS — I6310 Cerebral infarction due to embolism of unspecified precerebral artery: Secondary | ICD-10-CM | POA: Diagnosis not present

## 2014-12-16 MED ORDER — HYDROCODONE-ACETAMINOPHEN 5-325 MG PO TABS: ORAL_TABLET | ORAL | Status: DC

## 2014-12-16 MED ORDER — VALACYCLOVIR HCL 1 G PO TABS: 1000.0000 mg | ORAL_TABLET | Freq: Three times a day (TID) | ORAL | Status: DC

## 2014-12-16 NOTE — Progress Notes (Signed)
Subjective:  This chart was scribed for [name], MD by [facility], medical scribe at [facility].The patient was seen in exam room 11 and the patient's care was started at 3:05 PM.   Patient ID: [name], female    DOB: April 08, 1923, 81 y.o.   MRN: [id number] Chief Complaint  Patient presents with  . Rash    x this morning, stomach area, pt. thinks its shingles    HPI HPI Comments: [name] is a 81 y.o. female who presents to [facility] complaining of a rash over the left abdomen, onset two days ago. Hx of shingles. She has not had the shingles vaccine. She would like the flu shot today.  Patient Active Problem List   Diagnosis Date Noted  . Dyspnea 05/22/2014  . Tachycardia-bradycardia syndrome with symptomatic bradycardia 01/03/2014  . Pacemaker battery depletion 12/28/2013  . History of CVA (cerebrovascular accident) 10/25/2013  . Diabetes ([place]) 08/03/2013  . Myofacial muscle pain 10/16/2011  . Back pain, Left Flank 07/01/2011  . Supratherapeutic INR 06/20/2011  . Melena 06/20/2011  . Acute blood loss anemia 06/20/2011  . Cerebral embolism with cerebral infarction ([place]) 06/12/2011  . CVA (cerebral infarction) 05/20/2011  . GI bleed 05/19/2011  . Weakness of left side of body 05/16/2011  . Elevated digoxin level 05/16/2011  . Pacemaker 05/21/2010  . Edema 04/24/2010  . Pericardial effusion, Large   . Chronic anticoagulation   . HTN (hypertension)   . Atrial fibrillation ([place]) 04/14/2010    Prior to Admission medications   Medication Sig Start Date End Date Taking? Authorizing Provider  codeine 15 MG tablet Take 1 tablet (15 mg total) by mouth every 6 (six) hours as needed. 07/17/14  Yes Darlyne Russian, MD  DIGITEK 125 MCG tablet TAKE 1 TABLET(S) BY MOUTH DAILY 03/28/14  Yes Darlyne Russian, MD  digoxin (LANOXIN) 0.125 MG tablet Take 1 tablet (0.125 mg total) by mouth daily. PATIENT NEEDS OFFICE VISIT FOR  ADDITIONAL REFILLS 12/12/14  Yes Darlyne Russian, MD  multivitamin [facility]) per tablet Take 1 tablet by mouth daily.     Yes Historical Provider, MD  potassium chloride (K-DUR,KLOR-CON) 10 MEQ tablet take 2 tablets by mouth once daily WHEN YOU TAKE TORSEMIDE 08/13/14  Yes Mihai Croitoru, MD  Thiamine HCl (VITAMIN B-1 PO) Take 1 tablet by mouth daily.   Yes Historical Provider, MD  torsemide (DEMADEX) 20 MG tablet Take 2 tablets (40 mg total) by mouth daily. 05/22/14  Yes Mihai Croitoru, MD  verapamil (CALAN-SR) 180 MG CR tablet Take 1 tablet (180 mg total) by mouth at bedtime. 03/26/14  Yes Mihai Croitoru, MD  warfarin (COUMADIN) 5 MG tablet Take 1 tablet by mouth daily or as directed by coumadin clinic 12/14/14  Yes Mihai Croitoru, MD  enoxaparin (LOVENOX) 100 MG/ML injection Inject 1 mL (100 mg total) into the skin daily. Patient not taking: Reported on 12/16/2014 12/13/14   Sanda Klein, MD   Allergies  Allergen Reactions  . Iodinated Diagnostic Agents Hives    HIVES 15MIN S/P IV CONTRAST INJECTION,WILL NEED 13 HR PREP FOR FUTURE INJECTIONS, ok s/p 50mg  po benadryl//a.calhoun  . Anti-Inflammatory Enzyme [Nutritional Supplements]     Retains fluids and headaches  . Arthrotec [Diclofenac-Misoprostol] Other (See Comments)    unknown  . Biaxin [Clarithromycin] Other (See Comments)    unknown  . Plavix [Clopidogrel Bisulfate] Other (See Comments)    unknown  . Spironolactone  Hair loss   Review of Systems  Skin: Positive for rash.      Objective:  BP 130/60 mmHg  Pulse 80  Temp(Src) 98.3 F (36.8 C) (Oral)  Resp 16  Ht 5\' 5"  (1.651 m)  Wt 166 lb (75.297 kg)  BMI 27.62 kg/m2  SpO2 98% Physical Exam  Constitutional: She is oriented to person, place, and time. She appears well-developed and well-nourished. No distress.  HENT:  Head: Normocephalic and atraumatic.  Eyes: Pupils are equal, round, and reactive to light.  Neck: Normal range of motion.  Cardiovascular: Normal  rate and regular rhythm.   Pulmonary/Chest: Effort normal. No respiratory distress.  Musculoskeletal: Normal range of motion.  Neurological: She is alert and oriented to person, place, and time.  Skin: Skin is warm and dry.  Vesicles in the T9 dermatome on the left.  Psychiatric: She has a normal mood and affect. Her behavior is normal.  Nursing note and vitals reviewed.     Assessment & Plan:  Flu vaccine need - Plan: Flu Vaccine QUAD 36+ mos IM  Shingles  Meds ordered this encounter  Medications  . valACYclovir (VALTREX) 1000 MG tablet    Sig: Take 1 tablet (1,000 mg total) by mouth 3 (three) times daily.    Dispense:  30 tablet    Refill:  0  . HYDROcodone-acetaminophen (NORCO/VICODIN) 5-325 MG tablet    Sig: 1 at bedtime for pain relief to sleep    Dispense:  15 tablet    Refill:  0       I have completed the patient encounter in its entirety as documented by the scribe, with editing by me where necessary. Robert P. Laney Pastor, M.D. By signing my name below, I, Nadim Abuhashem, attest that this documentation has been prepared under the direction and in the presence of [name], MD.  Electronically Signed: Lora Havens, medical scribe. 12/16/2014, 3:07 PM.

## 2014-12-17 ENCOUNTER — Telehealth: Payer: Self-pay | Admitting: Pharmacist Clinician (PhC)/ Clinical Pharmacy Specialist

## 2014-12-17 NOTE — Telephone Encounter (Signed)
Yes, I would delay for 2-3 weeks

## 2014-12-17 NOTE — Telephone Encounter (Signed)
Patient scheduled for subxyphoid pericardial window procedure on 11/29; now has shingles on left side of chest.  Wants to know if procedure needs to be delayed

## 2014-12-17 NOTE — Telephone Encounter (Signed)
Patient notified surgery will need to be rescheduled.  Date and time will be up to Dr. Servando Snare.  Left message for his nurse of this info and to please call the patient with this info and let us know for the Lovenox bridging.  Patient will await a call from Dr. Everrett Coombe office.  Voiced understanding.

## 2014-12-18 ENCOUNTER — Telehealth: Payer: Self-pay

## 2014-12-18 ENCOUNTER — Telehealth: Payer: Self-pay | Admitting: Cardiovascular Disease

## 2014-12-18 ENCOUNTER — Ambulatory Visit (INDEPENDENT_AMBULATORY_CARE_PROVIDER_SITE_OTHER): Payer: Medicare Other | Admitting: Pharmacist Clinician (PhC)/ Clinical Pharmacy Specialist

## 2014-12-18 DIAGNOSIS — I480 Paroxysmal atrial fibrillation: Secondary | ICD-10-CM

## 2014-12-18 DIAGNOSIS — Z7901 Long term (current) use of anticoagulants: Secondary | ICD-10-CM

## 2014-12-18 DIAGNOSIS — I6310 Cerebral infarction due to embolism of unspecified precerebral artery: Secondary | ICD-10-CM

## 2014-12-18 LAB — POCT INR: INR: 3.5

## 2014-12-18 NOTE — Telephone Encounter (Signed)
Pt states she was given OXYCODONE and had told the Dr it keeps her awake but he gave it to her anyway and she hasn't slept, really need something else called in Please call [place]

## 2014-12-18 NOTE — Telephone Encounter (Signed)
Per Thurmond Butts at Dr. Everrett Coombe office rescheduled surgery and message sent to [facility] for lovenox bridging.

## 2014-12-18 NOTE — Telephone Encounter (Signed)
Spoke with patient and changed dates on lovenox bridge schedule to coordinate with new surgery date of Dec 14.  Pt voiced understanding of new schedule.

## 2014-12-18 NOTE — Telephone Encounter (Signed)
Thurmond Butts is returning [place] call about rescheduling the pt's surgery and managing her coumadin. Please f/u with her  Thanks

## 2014-12-18 NOTE — Telephone Encounter (Signed)
Patient called and wants to know the status of her request for change of medication.   [phone]

## 2014-12-19 MED ORDER — TRAMADOL HCL 50 MG PO TABS: 50.0000 mg | ORAL_TABLET | Freq: Four times a day (QID) | ORAL | Status: DC | PRN

## 2014-12-19 NOTE — Telephone Encounter (Signed)
No--we gave her hydrocod because she said oxy keeps her awake If doesn't like hydrocod then we can call in tramadol 50 1-2 q6hrs and disp#30

## 2014-12-19 NOTE — Telephone Encounter (Signed)
Spoke to pt who verified that she has tried the hydrocodone and it also keeps her awake. She would like for me to call in the tramadol as written below. Done.

## 2014-12-21 ENCOUNTER — Ambulatory Visit (INDEPENDENT_AMBULATORY_CARE_PROVIDER_SITE_OTHER): Payer: Medicare Other | Admitting: Internal Medicine

## 2014-12-21 VITALS — BP 130/62 | HR 88 | Temp 98.2°F | Resp 16 | Ht 65.0 in | Wt 166.0 lb

## 2014-12-21 DIAGNOSIS — I868 Varicose veins of other specified sites: Secondary | ICD-10-CM

## 2014-12-21 DIAGNOSIS — I6310 Cerebral infarction due to embolism of unspecified precerebral artery: Secondary | ICD-10-CM

## 2014-12-21 DIAGNOSIS — I83899 Varicose veins of unspecified lower extremities with other complications: Secondary | ICD-10-CM

## 2014-12-21 DIAGNOSIS — I8390 Asymptomatic varicose veins of unspecified lower extremity: Principal | ICD-10-CM

## 2014-12-21 NOTE — Progress Notes (Signed)
Subjective:  This chart was scribed for [name] Lin, MD by [name], Medical Scribe. This patient was seen in Room 3 and the patient's care was started at 8:58 AM.    Patient ID: [name], female    DOB: 03-22-1923, 81 y.o.   MRN: [id number] Chief Complaint  Patient presents with  . believes she may have a blood clot in the left leg    HPI [name] is a 81 y.o. female who presents to [facility] complaining of possible blood clot in her left leg. She has a large patch of purple on her left upper thigh. It doesn't hurt but she wanted to double check.   She was here 5 days ago with shingles and she's doing better.   anticoagulated  Patient Active Problem List   Diagnosis Date Noted  . Dyspnea 05/22/2014  . Tachycardia-bradycardia syndrome with symptomatic bradycardia 01/03/2014  . Pacemaker battery depletion 12/28/2013  . History of CVA (cerebrovascular accident) 10/25/2013  . Diabetes ([place]) 08/03/2013  . Myofacial muscle pain 10/16/2011  . Back pain, Left Flank 07/01/2011  . Supratherapeutic INR 06/20/2011  . Melena 06/20/2011  . Acute blood loss anemia 06/20/2011  . Cerebral embolism with cerebral infarction ([place]) 06/12/2011  . CVA (cerebral infarction) 05/20/2011  . GI bleed 05/19/2011  . Weakness of left side of body 05/16/2011  . Elevated digoxin level 05/16/2011  . Pacemaker 05/21/2010  . Edema 04/24/2010  . Pericardial effusion, Large   . Chronic anticoagulation   . HTN (hypertension)   . Atrial fibrillation ([place]) 04/14/2010    Current outpatient prescriptions:  .  codeine 15 MG tablet, Take 1 tablet (15 mg total) by mouth every 6 (six) hours as needed., Disp: 30 tablet, Rfl: 0 .  DIGITEK 125 MCG tablet, TAKE 1 TABLET(S) BY MOUTH DAILY, Disp: 30 tablet, Rfl: 5 .  enoxaparin (LOVENOX) 100 MG/ML injection, Inject 1 mL (100 mg total) into the skin daily., Disp: 10 Syringe, Rfl: 0 .  multivitamin (THERAGRAN) per tablet, Take 1 tablet by mouth  daily.  , Disp: , Rfl:  .  potassium chloride (K-DUR,KLOR-CON) 10 MEQ tablet, take 2 tablets by mouth once daily WHEN YOU TAKE TORSEMIDE, Disp: 180 tablet, Rfl: 2 .  Thiamine HCl (VITAMIN B-1 PO), Take 1 tablet by mouth daily., Disp: , Rfl:  .  torsemide (DEMADEX) 20 MG tablet, Take 2 tablets (40 mg total) by mouth daily., Disp: 60 tablet, Rfl: 10 .  traMADol (ULTRAM) 50 MG tablet, Take 1-2 tablets (50-100 mg total) by mouth every 6 (six) hours as needed., Disp: 30 tablet, Rfl: 0 .  valACYclovir (VALTREX) 1000 MG tablet, Take 1 tablet (1,000 mg total) by mouth 3 (three) times daily., Disp: 30 tablet, Rfl: 0 .  verapamil (CALAN-SR) 180 MG CR tablet, Take 1 tablet (180 mg total) by mouth at bedtime., Disp: 30 tablet, Rfl: 10 .  warfarin (COUMADIN) 5 MG tablet, Take 1 tablet by mouth daily or as directed by coumadin clinic, Disp: 90 tablet, Rfl: 1 .  HYDROcodone-acetaminophen (NORCO/VICODIN) 5-325 MG tablet, 1 at bedtime for pain relief to sleep (Patient not taking: Reported on 12/21/2014), Disp: 15 tablet, Rfl: 0  Review of Systems  Constitutional: Negative for fatigue and unexpected weight change.  Respiratory: Negative for chest tightness and shortness of breath.   Cardiovascular: Negative for chest pain, palpitations and leg swelling.  Gastrointestinal: Negative for abdominal pain and blood in stool.  Skin:       Ecchymosis on upper left  thigh  Neurological: Negative for dizziness, syncope, light-headedness and headaches.       Objective:   Physical Exam  Constitutional: She is oriented to person, place, and time. She appears well-developed and well-nourished. No distress.  HENT:  Head: Normocephalic and atraumatic.  Eyes: EOM are normal. Pupils are equal, round, and reactive to light.  Neck: Neck supple.  Cardiovascular: Normal rate.   Pulmonary/Chest: Effort normal. No respiratory distress.  Musculoskeletal: Normal range of motion.   Anteriorly, Left upper thigh: An area of  varicosities. One is thrombosed and has caused ecchymosis 3cm x 2cm; no deep clot/mass/redness All nontender  Neurological: She is alert and oriented to person, place, and time.  Skin: Skin is warm and dry.  Psychiatric: She has a normal mood and affect. Her behavior is normal.  Nursing note and vitals reviewed.  Shingles L abd is scabbed/nontender today BP 130/62 mmHg  Pulse 88  Temp(Src) 98.2 F (36.8 C) (Oral)  Resp 16  Ht 5\' 5"  (1.651 m)  Wt 166 lb (75.297 kg)  BMI 27.62 kg/m2  SpO2 93%     Assessment & Plan:  Ruptured varicosity  reassured   By signing my name below, I, [name], attest that this documentation has been prepared under the direction and in the presence of [name] Lin, MD. Electronically Signed: [name], Scribe. 12/21/2014 , 8:58 AM .  I have completed the patient encounter in its entirety as documented by the scribe, with editing by me where necessary. Shaunak Kreis P. Laney Pastor, M.D.

## 2014-12-25 ENCOUNTER — Ambulatory Visit (INDEPENDENT_AMBULATORY_CARE_PROVIDER_SITE_OTHER): Payer: Medicare Other | Admitting: Pharmacist Clinician (PhC)/ Clinical Pharmacy Specialist

## 2014-12-25 DIAGNOSIS — Z7901 Long term (current) use of anticoagulants: Secondary | ICD-10-CM

## 2014-12-25 DIAGNOSIS — I6310 Cerebral infarction due to embolism of unspecified precerebral artery: Secondary | ICD-10-CM

## 2014-12-25 DIAGNOSIS — I480 Paroxysmal atrial fibrillation: Secondary | ICD-10-CM

## 2014-12-25 LAB — POCT INR: INR: 4

## 2015-01-01 ENCOUNTER — Encounter: Payer: Self-pay | Admitting: Cardiovascular Disease

## 2015-01-01 ENCOUNTER — Telehealth: Payer: Self-pay

## 2015-01-01 ENCOUNTER — Other Ambulatory Visit: Payer: Self-pay | Admitting: *Deleted

## 2015-01-01 NOTE — Telephone Encounter (Signed)
Pt states she have shingles really bad and is in a lot of pain, would like to have something with Codeine in it to help, had called yesterday but doesn't see the message Please call pt at [phone] and she can have someone go get it for her    [place]

## 2015-01-02 MED ORDER — ACETAMINOPHEN-CODEINE #3 300-30 MG PO TABS: 1.0000 | ORAL_TABLET | Freq: Four times a day (QID) | ORAL | Status: DC | PRN

## 2015-01-02 NOTE — Telephone Encounter (Signed)
Rx faxed

## 2015-01-02 NOTE — Telephone Encounter (Signed)
Meds ordered this encounter  Medications  . acetaminophen-codeine (TYLENOL #3) 300-30 MG tablet    Sig: Take 1-2 tablets by mouth every 6 (six) hours as needed for moderate pain (shingles).    Dispense:  100 tablet    Refill:  0

## 2015-01-08 ENCOUNTER — Telehealth: Payer: Self-pay | Admitting: Cardiovascular Disease

## 2015-01-08 ENCOUNTER — Encounter: Payer: Self-pay | Admitting: Cardiovascular Disease

## 2015-01-08 ENCOUNTER — Other Ambulatory Visit (HOSPITAL_COMMUNITY): Payer: Medicare Other

## 2015-01-08 DIAGNOSIS — R0602 Shortness of breath: Secondary | ICD-10-CM

## 2015-01-08 DIAGNOSIS — M7989 Other specified soft tissue disorders: Secondary | ICD-10-CM

## 2015-01-08 LAB — POCT INR: INR: 3.7

## 2015-01-08 NOTE — Telephone Encounter (Signed)
[name] is calling because her feet and legs are swollen . Please call   Thanks

## 2015-01-08 NOTE — Telephone Encounter (Signed)
Patient complaining of swelling in BLE up to her knees with 4+ pitting. Patient stated the swelling is worse than usual. Patient states it improves just a little when she takes her torsemide 40 mg. Patient is also taking an extra dose of torsemide 20 mg recently to help with her swelling. Patient states it is just not helping as much as it use to. Patient denies any pain in BLE. Patient cancel her surgery with Dr. Servando Snare due to not feeling well and having shingles. Encouraged patient to keep BLE elevated and to take her medication as prescribed. Will forward to Dr. Sallyanne Kuster for further advisement.

## 2015-01-08 NOTE — Telephone Encounter (Signed)
Can we please bring her in for an urgent visit with PA/NP([facility]?). Check BMET and Mg and BNP. May need to make pericardial surgery more urgent. Is she taking warfarin? Has she rescheduled with DR. Gerhardt?

## 2015-01-08 NOTE — Telephone Encounter (Signed)
Called patient back about Dr. Victorino December message. Patient will come in for BMET, Mg and BNP tomorrow. Patient is still on warfarin, not on Lovenox. Patient stated she has not rescheduled with Dr. Servando Snare, that she was waiting to get over her shingles and feel better. First available with PA/NP is on Monday. I will send a message to scheduling to see if they can find an opening sooner.

## 2015-01-09 ENCOUNTER — Inpatient Hospital Stay (HOSPITAL_COMMUNITY): Admission: RE | Admit: 2015-01-09 | Payer: Medicare Other | Source: Ambulatory Visit | Admitting: Cardiothoracic Surgery

## 2015-01-09 ENCOUNTER — Ambulatory Visit (INDEPENDENT_AMBULATORY_CARE_PROVIDER_SITE_OTHER): Payer: Medicare Other | Admitting: Pharmacist Clinician (PhC)/ Clinical Pharmacy Specialist

## 2015-01-09 ENCOUNTER — Encounter (HOSPITAL_COMMUNITY): Admission: RE | Payer: Self-pay | Source: Ambulatory Visit

## 2015-01-09 DIAGNOSIS — I6310 Cerebral infarction due to embolism of unspecified precerebral artery: Secondary | ICD-10-CM

## 2015-01-09 DIAGNOSIS — I480 Paroxysmal atrial fibrillation: Secondary | ICD-10-CM

## 2015-01-09 DIAGNOSIS — Z7901 Long term (current) use of anticoagulants: Secondary | ICD-10-CM

## 2015-01-09 LAB — BASIC METABOLIC PANEL
BUN: 18 mg/dL (ref 7–25)
CHLORIDE: 97 mmol/L — AB (ref 98–110)
CO2: 34 mmol/L — ABNORMAL HIGH (ref 20–31)
Calcium: 9.3 mg/dL (ref 8.6–10.4)
Creat: 1.08 mg/dL — ABNORMAL HIGH (ref 0.60–0.88)
GLUCOSE: 96 mg/dL (ref 65–99)
POTASSIUM: 4.1 mmol/L (ref 3.5–5.3)
SODIUM: 140 mmol/L (ref 135–146)

## 2015-01-09 LAB — MAGNESIUM: Magnesium: 2 mg/dL (ref 1.5–2.5)

## 2015-01-09 SURGERY — CREATION, PERICARDIAL WINDOW, SUBXIPHOID APPROACH
Anesthesia: General | Site: Chest

## 2015-01-10 LAB — BRAIN NATRIURETIC PEPTIDE: BRAIN NATRIURETIC PEPTIDE: 135.5 pg/mL — AB (ref 0.0–100.0)

## 2015-01-11 ENCOUNTER — Other Ambulatory Visit: Payer: Self-pay | Admitting: Emergency Medicine

## 2015-01-11 NOTE — Telephone Encounter (Signed)
Yes, please.

## 2015-01-11 NOTE — Telephone Encounter (Signed)
Patient has appointment 01/15/15 at 8:00 am with Rosaria Ferries PA. Patient was offered Monday, but stated she could not come that day. Informed patient it was very important for her to make her appointment and to call our office if her symptoms get worse. Patient verbalized understanding.

## 2015-01-14 NOTE — Progress Notes (Addendum)
Cardiology Office Note   Date:  01/15/2015   ID:  [name], DOB 07/25/1923, MRN [id number]  PCP:  [name] Reichmann, MD  Cardiologist:  Dr Juanetta Snow, PA-C   Chief Complaint  Patient presents with  . Leg Swelling    worse on left leg - up to knee  . Chest Pain    more of a discomfort  . Shortness of Breath    with exertion    History of Present Illness: [name] is a 81 y.o. female with a history of pericardial effusion (dx 2012), perm afib w/ MDT PPM 12/2013 for slow VR, chronic coumadin, CVA, GIB 2nd AVMs   [name] presents for evaluation of LE edema and SOB.  She saw Dr Servando Snare for the pericardial effusion, but got shingles and canceled the planned pericardial window.  Her weight is up about 10 pounds and her shortness of breath has gotten worse. She is waking up frequently in the night and isn't sure if it is from shortness of breath and not. Her lower extremity edema has gotten worse. Her dyspnea on exertion is gotten worse and she states she can only walk a few feet without having to stop. She sleeps most of the day and her activity is extremely limited because of her dyspnea. She has not been lightheaded or dizzy. She describes orthopnea. She says she is not short of breath at rest but her respiratory rate increases with conversation.  She is compliant with her Coumadin dosing and her INR was greater than 3 the last 2 times it was checked. Her Coumadin dose has been decreased and she is due for recheck. She has Lovenox at home but has not started it because she has not felt well enough to undergo the pericardial window.   Past Medical History  Diagnosis Date  . Pericardial effusion     Chronic. Last echo in April of 2012.   . Atrial fibrillation (HCC)     Chronic  . Chronic anticoagulation     on Coumadin  . History of GI bleed     once treated with Fe infusion  . HTN (hypertension)   . Pacemaker     placed for tachybrady.    . Valvular heart disease   . Obesity   . LVH (left ventricular hypertrophy)   . Thrombophlebitis 1967    associated with pregnancy  . Diverticulosis   . Heart murmur   . Osteoporosis   . Hx of echocardiogram 07/28/2011    EF>55% small to moderate and is mostly located posteriorly. there is no overt evidence of tamponade although subtle diastolic collapse of the right ventricle.  . Sleep apnea     mild-no cpap  . History of blood transfusion "several times"  . CHF (congestive heart failure) ([place])   . Pneumonia ~ 1986  . Positive TB test   . Anemia   . Iron deficiency anemia     "I've had an iron transfusion"  . Stroke [facility]) April 2013    right frontal  . Arthritis   . Tachycardia-bradycardia syndrome [facility])     s/p Medtronic [place]  model number [id number], serial number [id number] H 12/2013    Past Surgical History  Procedure Laterality Date  . Breast lumpectomy Bilateral     negative for cancer  . Esophagogastroduodenoscopy  05/19/2011    Procedure: ESOPHAGOGASTRODUODENOSCOPY (EGD);  Surgeon: Lear Ng, MD;  Location: [facility] ENDOSCOPY;  Service: Endoscopy;  Laterality:  N/A;  doctor aware of inr   will try to be here no later than 230  . Cholecystectomy  2005  . Esophagogastroduodenoscopy  06/22/2011    Procedure: ESOPHAGOGASTRODUODENOSCOPY (EGD);  Surgeon: Arta Silence, MD;  Location: [facility] ENDOSCOPY;  Service: Endoscopy;  Laterality: N/A;  Check PT/INR in am  . Givens capsule study  06/23/2011    Procedure: GIVENS CAPSULE STUDY;  Surgeon: Arta Silence, MD;  Location: [facility] ENDOSCOPY;  Service: Endoscopy;  Laterality: N/A;  . Colonoscopy  08/11/2011    Procedure: COLONOSCOPY;  Surgeon: Jeryl Columbia, MD;  Location: [facility] ENDOSCOPY;  Service: Endoscopy;  Laterality: N/A;  . Hot hemostasis  08/11/2011    Procedure: HOT HEMOSTASIS (ARGON PLASMA COAGULATION/BICAP);  Surgeon: Jeryl Columbia, MD;  Location: [facility] ENDOSCOPY;  Service: Endoscopy;  Laterality: N/A;  . Mass excision Left 09/14/2013      Procedure: EXCISION MASS LEFT WRIST;  Surgeon: Leanora Cover, MD;  Location: [place];  Service: Orthopedics;  Laterality: Left;  . Pacemaker generator change  01/02/2014    w/lead replacement  . Tonsillectomy and adenoidectomy  1950  . Insert / replace / remove pacemaker  2001    Last generator in 2006; interrogated Dec 2012  . Cataract extraction w/ intraocular lens  implant, bilateral Bilateral   . Lipoma excision Left 07/2013    wrist  . Pacemaker generator change N/A 01/02/2014    Procedure: PACEMAKER GENERATOR CHANGE;  Surgeon: Sanda Klein, MD;  Location: [place] CATH LAB;  Service: Cardiovascular;  Laterality: N/A;  . Lead revision N/A 01/02/2014    Procedure: LEAD REVISION;  Surgeon: Sanda Klein, MD;  Location: [place] CATH LAB;  Service: Cardiovascular;  Laterality: N/A;    Current Outpatient Prescriptions  Medication Sig Dispense Refill  . acetaminophen-codeine (TYLENOL #3) 300-30 MG tablet Take 1-2 tablets by mouth every 6 (six) hours as needed for moderate pain (shingles). 100 tablet 0  . codeine 15 MG tablet Take 1 tablet (15 mg total) by mouth every 6 (six) hours as needed. 30 tablet 0  . DIGITEK 125 MCG tablet TAKE 1 TABLET(S) BY MOUTH DAILY 30 tablet 5  . multivitamin (THERAGRAN) per tablet Take 1 tablet by mouth daily.      . potassium chloride (K-DUR,KLOR-CON) 10 MEQ tablet take 2 tablets by mouth once daily WHEN YOU TAKE TORSEMIDE 180 tablet 2  . torsemide (DEMADEX) 20 MG tablet Take 2 tablets (40 mg total) by mouth daily. 60 tablet 10  . valACYclovir (VALTREX) 1000 MG tablet Take 1 tablet (1,000 mg total) by mouth 3 (three) times daily. 30 tablet 0  . verapamil (CALAN-SR) 180 MG CR tablet Take 1 tablet (180 mg total) by mouth at bedtime. 30 tablet 10  . warfarin (COUMADIN) 5 MG tablet Take 1 tablet by mouth daily or as directed by coumadin clinic 90 tablet 1   No current facility-administered medications for this visit.    Allergies:   Iodinated diagnostic  agents; Anti-inflammatory enzyme; Arthrotec; Biaxin; Plavix; and Spironolactone    Social History:  The patient  reports that she has never smoked. She has never used smokeless tobacco. She reports that she drinks alcohol. She reports that she does not use illicit drugs.   Family History:  The patient's family history includes Pneumonia in her father; Stroke in her father and mother.    ROS:  Please see the history of present illness. All other systems are reviewed and negative.    PHYSICAL EXAM: VS:  BP 126/60 mmHg  Pulse 76  Ht 5\' 5"  (1.651 m)  Wt 163 lb 6.4 oz (74.118 kg)  BMI 27.19 kg/m2 , BMI Body mass index is 27.19 kg/(m^2). GEN: Well nourished, well developed, female in mild respiratory distress at rest HEENT: normal for age  Neck: JVD at 10 cm, strongly positive hepatojugular reflux, no carotid bruit, no masses Cardiac: RRR; 2/6 murmur, no rubs, or gallops Respiratory:  clear to auscultation bilaterally, normal work of breathing GI: soft, nontender, nondistended, + BS MS: no deformity or atrophy; 2+ lower extremity edema; distal pulses are 2+ in upper extremities, difficult to palpate because of the edema in both lower extremities  Skin: warm and dry, no rash Neuro:  Strength and sensation are intact Psych: euthymic mood, full affect   EKG:  EKG is ordered today. The ekg ordered today demonstrates atrial fibrillation with ventricular pacing  ECHO: 12/06/2014 - Left ventricle: The cavity size was normal. There was mild concentric hypertrophy. Systolic function was normal. The estimated ejection fraction was in the range of 60% to 65%. Wall motion was normal; there were no regional wall motion abnormalities. - Aortic valve: Cusp separation was reduced. - Mitral valve: Moderately calcified annulus. - Left atrium: The atrium was severely dilated. - Right ventricle: The cavity size was mildly dilated. Wall thickness was normal. - Right atrium: The atrium was  severely dilated. - Pulmonary arteries: Systolic pressure was mildly increased. PA peak pressure: 33 mm Hg (S). - Pericardium, extracardiac: A large pericardial effusion was identified mostly posterior to the heart (3cm), and also along the right ventricular free wall, and the right atrial free wall. There was no evidence of hemodynamic compromise. Impressions: - Prior echocardiogram, 29mm posterior, 07/05/14.  Recent Labs: 03/20/2014: Hemoglobin 13.5; Platelets 233 07/17/2014: ALT 19 01/08/2015: BUN 18; Creat 1.08*; Magnesium 2.0; Potassium 4.1; Sodium 140    Lipid Panel    Component Value Date/Time   CHOL 160 08/03/2013 1155   TRIG 114 08/03/2013 1155   HDL 37* 08/03/2013 1155   CHOLHDL 4.3 08/03/2013 1155   VLDL 23 08/03/2013 1155   LDLCALC 100* 08/03/2013 1155     Wt Readings from Last 3 Encounters:  01/15/15 163 lb 6.4 oz (74.118 kg)  12/21/14 166 lb (75.297 kg)  12/16/14 166 lb (75.297 kg)     Other studies Reviewed: Additional studies/ records that were reviewed today include: Dr. Everrett Coombe consult note, echocardiogram and previous office notes.  ASSESSMENT AND PLAN:  1.  Pericardial effusion: Effusion had increased in size between July and November. She got shingles and because of that has not yet had her pericardial window. We will get a stat echocardiogram and ask Dr. Servando Snare or one of his colleagues to evaluate her.  2. Acute on chronic diastolic CHF: We will start her on IV Lasix to pull fluid off. We will have to keep a close eye on her blood pressure to make sure that she does not get hypotensive.  3. Chronic anticoagulation with Coumadin: Her INR was 3.6 today. Her INR has been greater than 3 the last 2 times it was checked. She has had a stroke in the past, so we'll hold her Coumadin and start her on heparin once INR is less than 2.0.   Current medicines are reviewed at length with the patient today.  The patient does not have concerns regarding  medicines.  The following changes have been made:  To be determined  Labs/ tests ordered today include:   testing to be done in the hospital  Disposition:   FU with Dr. Sallyanne Kuster  Signed, Lenoard Aden  01/15/2015 8:12 AM    [facility] [address], [place], [place]  [address] Phone: [phone]; Fax: [phone]    Seen and discussed with Rosaria Ferries, PAC. Worsening edema is concerning for progression to pericardial tamponade. Plan for drainage was delayed by development of shingles. Needs careful management of anticoagulation around time of the procedure due to high risk of embolic stroke and bleeding complications. Best managed as an inpatient with carefully monitored diuresis and IV heparin "bridge".  Sanda Klein, MD, [facility] [phone] office [phone] pager

## 2015-01-15 ENCOUNTER — Ambulatory Visit (INDEPENDENT_AMBULATORY_CARE_PROVIDER_SITE_OTHER): Payer: Medicare Other | Admitting: Physician Assistant

## 2015-01-15 ENCOUNTER — Inpatient Hospital Stay (HOSPITAL_COMMUNITY): Payer: Medicare Other

## 2015-01-15 ENCOUNTER — Inpatient Hospital Stay (HOSPITAL_COMMUNITY)
Admission: AD | Admit: 2015-01-15 | Discharge: 2015-01-26 | DRG: 270 | Disposition: A | Discharge status: Home / Self Care | Payer: Medicare Other | Source: Ambulatory Visit | Attending: Cardiovascular Disease | Admitting: Cardiovascular Disease

## 2015-01-15 ENCOUNTER — Encounter: Payer: Self-pay | Admitting: Physician Assistant

## 2015-01-15 ENCOUNTER — Encounter (HOSPITAL_COMMUNITY): Payer: Self-pay | Admitting: Physician Assistant

## 2015-01-15 VITALS — BP 126/60 | HR 76 | Ht 65.0 in | Wt 163.4 lb

## 2015-01-15 DIAGNOSIS — Y95 Nosocomial condition: Secondary | ICD-10-CM | POA: Diagnosis present

## 2015-01-15 DIAGNOSIS — Z950 Presence of cardiac pacemaker: Secondary | ICD-10-CM | POA: Diagnosis not present

## 2015-01-15 DIAGNOSIS — J9811 Atelectasis: Secondary | ICD-10-CM | POA: Diagnosis not present

## 2015-01-15 DIAGNOSIS — Z9689 Presence of other specified functional implants: Secondary | ICD-10-CM

## 2015-01-15 DIAGNOSIS — I5033 Acute on chronic diastolic (congestive) heart failure: Secondary | ICD-10-CM | POA: Diagnosis present

## 2015-01-15 DIAGNOSIS — Z8673 Personal history of transient ischemic attack (TIA), and cerebral infarction without residual deficits: Secondary | ICD-10-CM | POA: Diagnosis not present

## 2015-01-15 DIAGNOSIS — I313 Pericardial effusion (noninflammatory): Secondary | ICD-10-CM | POA: Diagnosis present

## 2015-01-15 DIAGNOSIS — E669 Obesity, unspecified: Secondary | ICD-10-CM | POA: Diagnosis present

## 2015-01-15 DIAGNOSIS — Z86711 Personal history of pulmonary embolism: Secondary | ICD-10-CM | POA: Diagnosis not present

## 2015-01-15 DIAGNOSIS — R05 Cough: Secondary | ICD-10-CM | POA: Diagnosis not present

## 2015-01-15 DIAGNOSIS — I639 Cerebral infarction, unspecified: Secondary | ICD-10-CM | POA: Diagnosis present

## 2015-01-15 DIAGNOSIS — M810 Age-related osteoporosis without current pathological fracture: Secondary | ICD-10-CM | POA: Diagnosis present

## 2015-01-15 DIAGNOSIS — R0982 Postnasal drip: Secondary | ICD-10-CM | POA: Diagnosis present

## 2015-01-15 DIAGNOSIS — E875 Hyperkalemia: Secondary | ICD-10-CM | POA: Diagnosis present

## 2015-01-15 DIAGNOSIS — R54 Age-related physical debility: Secondary | ICD-10-CM | POA: Diagnosis present

## 2015-01-15 DIAGNOSIS — I3139 Other pericardial effusion (noninflammatory): Secondary | ICD-10-CM

## 2015-01-15 DIAGNOSIS — J939 Pneumothorax, unspecified: Secondary | ICD-10-CM

## 2015-01-15 DIAGNOSIS — J45909 Unspecified asthma, uncomplicated: Secondary | ICD-10-CM | POA: Diagnosis present

## 2015-01-15 DIAGNOSIS — Z7901 Long term (current) use of anticoagulants: Secondary | ICD-10-CM

## 2015-01-15 DIAGNOSIS — R058 Other specified cough: Secondary | ICD-10-CM | POA: Insufficient documentation

## 2015-01-15 DIAGNOSIS — I319 Disease of pericardium, unspecified: Secondary | ICD-10-CM | POA: Diagnosis not present

## 2015-01-15 DIAGNOSIS — J189 Pneumonia, unspecified organism: Secondary | ICD-10-CM

## 2015-01-15 DIAGNOSIS — I482 Chronic atrial fibrillation, unspecified: Secondary | ICD-10-CM | POA: Diagnosis present

## 2015-01-15 DIAGNOSIS — I509 Heart failure, unspecified: Secondary | ICD-10-CM | POA: Diagnosis not present

## 2015-01-15 DIAGNOSIS — R062 Wheezing: Secondary | ICD-10-CM | POA: Diagnosis not present

## 2015-01-15 DIAGNOSIS — J9601 Acute respiratory failure with hypoxia: Secondary | ICD-10-CM | POA: Diagnosis present

## 2015-01-15 DIAGNOSIS — I10 Essential (primary) hypertension: Secondary | ICD-10-CM | POA: Diagnosis present

## 2015-01-15 DIAGNOSIS — E119 Type 2 diabetes mellitus without complications: Secondary | ICD-10-CM | POA: Diagnosis present

## 2015-01-15 DIAGNOSIS — I110 Hypertensive heart disease with heart failure: Secondary | ICD-10-CM | POA: Diagnosis present

## 2015-01-15 DIAGNOSIS — K219 Gastro-esophageal reflux disease without esophagitis: Secondary | ICD-10-CM | POA: Diagnosis present

## 2015-01-15 DIAGNOSIS — R059 Cough, unspecified: Secondary | ICD-10-CM

## 2015-01-15 DIAGNOSIS — J209 Acute bronchitis, unspecified: Secondary | ICD-10-CM | POA: Diagnosis present

## 2015-01-15 DIAGNOSIS — Z01818 Encounter for other preprocedural examination: Secondary | ICD-10-CM

## 2015-01-15 DIAGNOSIS — H9190 Unspecified hearing loss, unspecified ear: Secondary | ICD-10-CM | POA: Diagnosis present

## 2015-01-15 HISTORY — DX: Chronic diastolic (congestive) heart failure: I50.32

## 2015-01-15 HISTORY — DX: Personal history of other infectious and parasitic diseases: Z86.19

## 2015-01-15 HISTORY — DX: Chronic atrial fibrillation, unspecified: I48.20

## 2015-01-15 LAB — COMPREHENSIVE METABOLIC PANEL
ALK PHOS: 69 U/L (ref 38–126)
ALT: 18 U/L (ref 14–54)
AST: 28 U/L (ref 15–41)
Albumin: 3.3 g/dL — ABNORMAL LOW (ref 3.5–5.0)
Anion gap: 12 (ref 5–15)
BILIRUBIN TOTAL: 0.6 mg/dL (ref 0.3–1.2)
BUN: 12 mg/dL (ref 6–20)
CHLORIDE: 98 mmol/L — AB (ref 101–111)
CO2: 36 mmol/L — AB (ref 22–32)
Calcium: 8.8 mg/dL — ABNORMAL LOW (ref 8.9–10.3)
Creatinine, Ser: 1.21 mg/dL — ABNORMAL HIGH (ref 0.44–1.00)
GFR calc Af Amer: 45 mL/min — ABNORMAL LOW (ref >60)
GFR, EST NON AFRICAN AMERICAN: 38 mL/min — AB (ref >60)
GLUCOSE: 155 mg/dL — AB (ref 65–99)
POTASSIUM: 3.8 mmol/L (ref 3.5–5.1)
Sodium: 146 mmol/L — ABNORMAL HIGH (ref 135–145)
Total Protein: 6.3 g/dL — ABNORMAL LOW (ref 6.5–8.1)

## 2015-01-15 LAB — CBC WITH DIFFERENTIAL/PLATELET
BASOS PCT: 0 %
Basophils Absolute: 0 10*3/uL (ref 0.0–0.1)
Eosinophils Absolute: 0.2 10*3/uL (ref 0.0–0.7)
Eosinophils Relative: 4 %
HEMATOCRIT: 38 % (ref 36.0–46.0)
HEMOGLOBIN: 11.9 g/dL — AB (ref 12.0–15.0)
LYMPHS PCT: 15 %
Lymphs Abs: 0.9 10*3/uL (ref 0.7–4.0)
MCH: 28.9 pg (ref 26.0–34.0)
MCHC: 31.3 g/dL (ref 30.0–36.0)
MCV: 92.2 fL (ref 78.0–100.0)
MONOS PCT: 10 %
Monocytes Absolute: 0.6 10*3/uL (ref 0.1–1.0)
NEUTROS ABS: 4.1 10*3/uL (ref 1.7–7.7)
NEUTROS PCT: 71 %
Platelets: 248 10*3/uL (ref 150–400)
RBC: 4.12 MIL/uL (ref 3.87–5.11)
RDW: 15.1 % (ref 11.5–15.5)
WBC: 5.7 10*3/uL (ref 4.0–10.5)

## 2015-01-15 LAB — GLUCOSE, CAPILLARY
GLUCOSE-CAPILLARY: 141 mg/dL — AB (ref 65–99)
GLUCOSE-CAPILLARY: 145 mg/dL — AB (ref 65–99)
Glucose-Capillary: 102 mg/dL — ABNORMAL HIGH (ref 65–99)

## 2015-01-15 LAB — BRAIN NATRIURETIC PEPTIDE: B Natriuretic Peptide: 219.9 pg/mL — ABNORMAL HIGH (ref 0.0–100.0)

## 2015-01-15 LAB — TSH: TSH: 1.989 u[IU]/mL (ref 0.350–4.500)

## 2015-01-15 LAB — PROTIME-INR
INR: 3.25 — ABNORMAL HIGH (ref 0.00–1.49)
Prothrombin Time: 32.5 seconds — ABNORMAL HIGH (ref 11.6–15.2)

## 2015-01-15 MED ORDER — PHYTONADIONE 1 MG/0.5 ML ORAL SOLUTION
2.0000 mg | Freq: Once | ORAL | Status: AC
  Administered 2015-01-15: 2 mg via ORAL
  Filled 2015-01-15 (×2): qty 1

## 2015-01-15 MED ORDER — ALPRAZOLAM 0.25 MG PO TABS: 0.2500 mg | ORAL_TABLET | Freq: Two times a day (BID) | ORAL | Status: DC | PRN

## 2015-01-15 MED ORDER — CHLORHEXIDINE GLUCONATE 4 % EX LIQD
60.0000 mL | Freq: Once | CUTANEOUS | Status: AC
  Administered 2015-01-16: 4 via TOPICAL
  Filled 2015-01-15: qty 30

## 2015-01-15 MED ORDER — CEFUROXIME SODIUM 1.5 G IJ SOLR
1.5000 g | INTRAMUSCULAR | Status: AC
  Administered 2015-01-16: 1.5 g via INTRAVENOUS
  Filled 2015-01-15: qty 1.5

## 2015-01-15 MED ORDER — SODIUM CHLORIDE 0.9 % IJ SOLN: 3.0000 mL | INTRAMUSCULAR | Status: DC | PRN

## 2015-01-15 MED ORDER — ACETAMINOPHEN 325 MG PO TABS
650.0000 mg | ORAL_TABLET | ORAL | Status: DC | PRN
Start: 2015-01-15 — End: 2015-01-16

## 2015-01-15 MED ORDER — SODIUM CHLORIDE 0.9 % IV SOLN: 250.0000 mL | INTRAVENOUS | Status: DC | PRN

## 2015-01-15 MED ORDER — POTASSIUM CHLORIDE CRYS ER 20 MEQ PO TBCR
20.0000 meq | EXTENDED_RELEASE_TABLET | Freq: Two times a day (BID) | ORAL | Status: DC
  Administered 2015-01-15 – 2015-01-21 (×12): 20 meq via ORAL
  Filled 2015-01-15 (×2): qty 2
  Filled 2015-01-15 (×10): qty 1

## 2015-01-15 MED ORDER — INSULIN ASPART 100 UNIT/ML ~~LOC~~ SOLN
0.0000 [IU] | Freq: Three times a day (TID) | SUBCUTANEOUS | Status: DC
  Administered 2015-01-15: 2 [IU] via SUBCUTANEOUS

## 2015-01-15 MED ORDER — ADULT MULTIVITAMIN W/MINERALS CH
1.0000 | ORAL_TABLET | Freq: Every day | ORAL | Status: DC
  Administered 2015-01-17 – 2015-01-26 (×10): 1 via ORAL
  Filled 2015-01-15 (×10): qty 1

## 2015-01-15 MED ORDER — INSULIN ASPART 100 UNIT/ML ~~LOC~~ SOLN: 0.0000 [IU] | Freq: Every day | SUBCUTANEOUS | Status: DC

## 2015-01-15 MED ORDER — ACETAMINOPHEN-CODEINE #3 300-30 MG PO TABS: 1.0000 | ORAL_TABLET | Freq: Four times a day (QID) | ORAL | Status: DC | PRN

## 2015-01-15 MED ORDER — ZOLPIDEM TARTRATE 5 MG PO TABS
5.0000 mg | ORAL_TABLET | Freq: Every evening | ORAL | Status: DC | PRN
  Administered 2015-01-15: 5 mg via ORAL
  Filled 2015-01-15: qty 1

## 2015-01-15 MED ORDER — ONDANSETRON HCL 4 MG/2ML IJ SOLN
4.0000 mg | Freq: Four times a day (QID) | INTRAMUSCULAR | Status: DC | PRN
  Administered 2015-01-16: 4 mg via INTRAVENOUS

## 2015-01-15 MED ORDER — VERAPAMIL HCL ER 180 MG PO TBCR
180.0000 mg | EXTENDED_RELEASE_TABLET | Freq: Every day | ORAL | Status: DC
  Administered 2015-01-15: 180 mg via ORAL
  Filled 2015-01-15 (×2): qty 1

## 2015-01-15 MED ORDER — CHLORHEXIDINE GLUCONATE 4 % EX LIQD
Freq: Two times a day (BID) | CUTANEOUS | Status: AC
  Administered 2015-01-15 – 2015-01-16 (×2): via TOPICAL
  Filled 2015-01-15: qty 45

## 2015-01-15 MED ORDER — NITROGLYCERIN 0.4 MG SL SUBL: 0.4000 mg | SUBLINGUAL_TABLET | SUBLINGUAL | Status: DC | PRN

## 2015-01-15 MED ORDER — DIGOXIN 125 MCG PO TABS
0.1250 mg | ORAL_TABLET | Freq: Every day | ORAL | Status: DC
  Administered 2015-01-18 – 2015-01-24 (×7): 0.125 mg via ORAL
  Filled 2015-01-15 (×8): qty 1

## 2015-01-15 MED ORDER — SODIUM CHLORIDE 0.9 % IJ SOLN
3.0000 mL | Freq: Two times a day (BID) | INTRAMUSCULAR | Status: DC
  Administered 2015-01-15 (×2): 3 mL via INTRAVENOUS

## 2015-01-15 MED ORDER — CHLORHEXIDINE GLUCONATE 4 % EX LIQD
60.0000 mL | Freq: Once | CUTANEOUS | Status: AC
  Administered 2015-01-15: 4 via TOPICAL

## 2015-01-15 MED ORDER — FUROSEMIDE 10 MG/ML IJ SOLN
40.0000 mg | Freq: Two times a day (BID) | INTRAMUSCULAR | Status: DC
  Administered 2015-01-15 – 2015-01-18 (×7): 40 mg via INTRAVENOUS
  Filled 2015-01-15 (×7): qty 4

## 2015-01-15 NOTE — Anesthesia Preprocedure Evaluation (Addendum)
Anesthesia Evaluation  Patient identified by MRN, date of birth, ID band Patient awake    Reviewed: Allergy & Precautions, NPO status , Patient's Chart, lab work & pertinent test results  Airway Mallampati: II  TM Distance: >3 FB Neck ROM: Full    Dental  (+) Teeth Intact, Dental Advisory Given   Pulmonary shortness of breath, sleep apnea ,    Pulmonary exam normal breath sounds clear to auscultation       Cardiovascular hypertension, Pt. on medications +CHF  + dysrhythmias (on coumadin) Atrial Fibrillation + pacemaker + Valvular Problems/Murmurs  Rhythm:Irregular Rate:Abnormal + Systolic murmurs V-paced Chronic pericardial effusion  TTE 12/06/14: Study Conclusions  - Left ventricle: The cavity size was normal. There was mild concentric hypertrophy. Systolic function was normal. Theestimated ejection fraction was in the range of 60% to 65%. Wallmotion was normal; there were no regional wall motionabnormalities. - Aortic valve: Cusp separation was reduced. - Mitral valve: Moderately calcified annulus. - Left atrium: The atrium was severely dilated. - Right ventricle: The cavity size was mildly dilated. Wall thickness was normal. - Right atrium: The atrium was severely dilated. - Pulmonary arteries: Systolic pressure was mildly increased. PApeak pressure: 33 mm Hg (S). - Pericardium, extracardiac: A large pericardial effusion wasidentified mostly posterior to the heart (3cm), and also alongthe right ventricular free wall, and the right atrial free wall.  There was no evidence of hemodynamic compromise.  Impressions:  - Prior echocardiogram, 50mm posterior, 07/05/14.    Neuro/Psych CVA, Residual Symptoms negative psych ROS   GI/Hepatic negative GI ROS, Neg liver ROS,   Endo/Other  diabetes  Renal/GU negative Renal ROS     Musculoskeletal  (+) Arthritis ,   Abdominal   Peds  Hematology  (+) Blood  dyscrasia, anemia ,   Anesthesia Other Findings Day of surgery medications reviewed with the patient.  Reproductive/Obstetrics                        Anesthesia Physical Anesthesia Plan  ASA: IV  Anesthesia Plan: General   Post-op Pain Management:    Induction: Intravenous  Airway Management Planned: Oral ETT  Additional Equipment: Arterial line and TEE  Intra-op Plan:   Post-operative Plan: Extubation in OR  Informed Consent: I have reviewed the patients History and Physical, chart, labs and discussed the procedure including the risks, benefits and alternatives for the proposed anesthesia with the patient or authorized representative who has indicated his/her understanding and acceptance.   Dental advisory given  Plan Discussed with: CRNA  Anesthesia Plan Comments: (Risks/benefits of general anesthesia discussed with patient including risk of damage to teeth, lips, gum, and tongue, nausea/vomiting, allergic reactions to medications, and the possibility of heart attack, stroke and death.  All patient questions answered.  Patient wishes to proceed.  INR 2.39 after Vitamin K yesterday.)       Anesthesia Quick Evaluation

## 2015-01-15 NOTE — H&P (Signed)
Cardiology History and Physical   Date:  01/15/2015   ID:  [name], DOB [birth year], MRN [id number]  PCP:  [name], MD  Cardiologist:  Dr Juanetta Snow, PA-C   Chief Complaint  Patient presents with  . Leg Swelling    worse on left leg - up to knee  . Chest Pain    more of a discomfort  . Shortness of Breath    with exertion    History of Present Illness: [name] is a 81 y.o. female with a history of pericardial effusion (dx 2012), perm afib w/ MDT PPM 12/2013 for slow VR, chronic coumadin, CVA, GIB 2nd AVMs   [name] presents for evaluation of LE edema and SOB.  She saw Dr Servando Snare for the pericardial effusion, but got shingles and canceled the planned pericardial window.  Her weight is up about 10 pounds and her shortness of breath has gotten worse. She is waking up frequently in the night and isn't sure if it is from shortness of breath and not. Her lower extremity edema has gotten worse. Her dyspnea on exertion is gotten worse and she states she can only walk a few feet without having to stop. She sleeps most of the day and her activity is extremely limited because of her dyspnea. She has not been lightheaded or dizzy. She describes orthopnea. She says she is not short of breath at rest but her respiratory rate increases with conversation.  She is compliant with her Coumadin dosing and her INR was greater than 3 the last 2 times it was checked. Her Coumadin dose has been decreased and she is due for recheck. She has Lovenox at home but has not started it because she has not felt well enough to undergo the pericardial window.   Past Medical History  Diagnosis Date  . Pericardial effusion     Chronic. Last echo in April of 2012.   . Atrial fibrillation (HCC)     Chronic  . Chronic anticoagulation     on Coumadin  . History of GI bleed     once treated with Fe infusion  . HTN (hypertension)   . Pacemaker     placed for  tachybrady.   . Valvular heart disease   . Obesity   . LVH (left ventricular hypertrophy)   . Thrombophlebitis 1967    associated with pregnancy  . Diverticulosis   . Heart murmur   . Osteoporosis   . Hx of echocardiogram 07/28/2011    EF>55% small to moderate and is mostly located posteriorly. there is no overt evidence of tamponade although subtle diastolic collapse of the right ventricle.  . Sleep apnea     mild-no cpap  . History of blood transfusion "several times"  . CHF (congestive heart failure) ([place])   . Pneumonia ~ 1986  . Positive TB test   . Anemia   . Iron deficiency anemia     "I've had an iron transfusion"  . Stroke [facility]) April 2013    right frontal  . Arthritis   . Tachycardia-bradycardia syndrome [facility])     s/p Medtronic [place]  model number [id number], serial number [id number] H 12/2013    Past Surgical History  Procedure Laterality Date  . Breast lumpectomy Bilateral     negative for cancer  . Esophagogastroduodenoscopy  05/19/2011    Procedure: ESOPHAGOGASTRODUODENOSCOPY (EGD);  Surgeon: Lear Ng, MD;  Location: [facility] ENDOSCOPY;  Service: Endoscopy;  Laterality: N/A;  doctor aware of inr   will try to be here no later than 230  . Cholecystectomy  2005  . Esophagogastroduodenoscopy  06/22/2011    Procedure: ESOPHAGOGASTRODUODENOSCOPY (EGD);  Surgeon: Arta Silence, MD;  Location: [facility] ENDOSCOPY;  Service: Endoscopy;  Laterality: N/A;  Check PT/INR in am  . Givens capsule study  06/23/2011    Procedure: GIVENS CAPSULE STUDY;  Surgeon: Arta Silence, MD;  Location: [facility] ENDOSCOPY;  Service: Endoscopy;  Laterality: N/A;  . Colonoscopy  08/11/2011    Procedure: COLONOSCOPY;  Surgeon: Jeryl Columbia, MD;  Location: [facility] ENDOSCOPY;  Service: Endoscopy;  Laterality: N/A;  . Hot hemostasis  08/11/2011    Procedure: HOT HEMOSTASIS (ARGON PLASMA COAGULATION/BICAP);  Surgeon: Jeryl Columbia, MD;  Location: [facility] ENDOSCOPY;  Service: Endoscopy;  Laterality: N/A;  . Mass excision  Left 09/14/2013    Procedure: EXCISION MASS LEFT WRIST;  Surgeon: Leanora Cover, MD;  Location: [place];  Service: Orthopedics;  Laterality: Left;  . Pacemaker generator change  01/02/2014    w/lead replacement  . Tonsillectomy and adenoidectomy  1950  . Insert / replace / remove pacemaker  2001    Last generator in 2006; interrogated Dec 2012  . Cataract extraction w/ intraocular lens  implant, bilateral Bilateral   . Lipoma excision Left 07/2013    wrist  . Pacemaker generator change N/A 01/02/2014    Procedure: PACEMAKER GENERATOR CHANGE;  Surgeon: Sanda Klein, MD;  Location: [place] CATH LAB;  Service: Cardiovascular;  Laterality: N/A;  . Lead revision N/A 01/02/2014    Procedure: LEAD REVISION;  Surgeon: Sanda Klein, MD;  Location: [place] CATH LAB;  Service: Cardiovascular;  Laterality: N/A;    Current Outpatient Prescriptions  Medication Sig Dispense Refill  . acetaminophen-codeine (TYLENOL #3) 300-30 MG tablet Take 1-2 tablets by mouth every 6 (six) hours as needed for moderate pain (shingles). 100 tablet 0  . codeine 15 MG tablet Take 1 tablet (15 mg total) by mouth every 6 (six) hours as needed. 30 tablet 0  . DIGITEK 125 MCG tablet TAKE 1 TABLET(S) BY MOUTH DAILY 30 tablet 5  . multivitamin (THERAGRAN) per tablet Take 1 tablet by mouth daily.      . potassium chloride (K-DUR,KLOR-CON) 10 MEQ tablet take 2 tablets by mouth once daily WHEN YOU TAKE TORSEMIDE 180 tablet 2  . torsemide (DEMADEX) 20 MG tablet Take 2 tablets (40 mg total) by mouth daily. 60 tablet 10  . valACYclovir (VALTREX) 1000 MG tablet Take 1 tablet (1,000 mg total) by mouth 3 (three) times daily. 30 tablet 0  . verapamil (CALAN-SR) 180 MG CR tablet Take 1 tablet (180 mg total) by mouth at bedtime. 30 tablet 10  . warfarin (COUMADIN) 5 MG tablet Take 1 tablet by mouth daily or as directed by coumadin clinic 90 tablet 1   No current facility-administered medications for this visit.    Allergies:    Iodinated diagnostic agents; Anti-inflammatory enzyme; Arthrotec; Biaxin; Plavix; and Spironolactone    Social History:  The patient  reports that she has never smoked. She has never used smokeless tobacco. She reports that she drinks alcohol. She reports that she does not use illicit drugs.   Family History:  The patient's family history includes Pneumonia in her father; Stroke in her father and mother.    ROS:  Please see the history of present illness. All other systems are reviewed and negative.    PHYSICAL EXAM: VS:  BP 126/60 mmHg  Pulse 76  Ht 5\' 5"  (1.651 m)  Wt 163 lb 6.4 oz (74.118 kg)  BMI 27.19 kg/m2 , BMI Body mass index is 27.19 kg/(m^2). GEN: Well nourished, well developed, female in mild respiratory distress at rest HEENT: normal for age  Neck: JVD at 10 cm, strongly positive hepatojugular reflux, no carotid bruit, no masses Cardiac: RRR; 2/6 murmur, no rubs, or gallops Respiratory:  clear to auscultation bilaterally, normal work of breathing GI: soft, nontender, nondistended, + BS MS: no deformity or atrophy; 2+ lower extremity edema; distal pulses are 2+ in upper extremities, difficult to palpate because of the edema in both lower extremities  Skin: warm and dry, no rash Neuro:  Strength and sensation are intact Psych: euthymic mood, full affect   EKG:  EKG is ordered today. The ekg ordered today demonstrates atrial fibrillation with ventricular pacing  ECHO: 12/06/2014 - Left ventricle: The cavity size was normal. There was mild concentric hypertrophy. Systolic function was normal. The estimated ejection fraction was in the range of 60% to 65%. Wall motion was normal; there were no regional wall motion abnormalities. - Aortic valve: Cusp separation was reduced. - Mitral valve: Moderately calcified annulus. - Left atrium: The atrium was severely dilated. - Right ventricle: The cavity size was mildly dilated. Wall thickness was normal. - Right  atrium: The atrium was severely dilated. - Pulmonary arteries: Systolic pressure was mildly increased. PA peak pressure: 33 mm Hg (S). - Pericardium, extracardiac: A large pericardial effusion was identified mostly posterior to the heart (3cm), and also along the right ventricular free wall, and the right atrial free wall. There was no evidence of hemodynamic compromise. Impressions: - Prior echocardiogram, 68mm posterior, 07/05/14.  Recent Labs: 03/20/2014: Hemoglobin 13.5; Platelets 233 07/17/2014: ALT 19 01/08/2015: BUN 18; Creat 1.08*; Magnesium 2.0; Potassium 4.1; Sodium 140    Lipid Panel    Component Value Date/Time   CHOL 160 08/03/2013 1155   TRIG 114 08/03/2013 1155   HDL 37* 08/03/2013 1155   CHOLHDL 4.3 08/03/2013 1155   VLDL 23 08/03/2013 1155   LDLCALC 100* 08/03/2013 1155     Wt Readings from Last 3 Encounters:  01/15/15 163 lb 6.4 oz (74.118 kg)  12/21/14 166 lb (75.297 kg)  12/16/14 166 lb (75.297 kg)     Other studies Reviewed: Additional studies/ records that were reviewed today include: Dr. Everrett Coombe consult note, echocardiogram and previous office notes.  ASSESSMENT AND PLAN: Determined by Dr Sallyanne Kuster  1.  Pericardial effusion: Effusion had increased in size between July and November. She got shingles and because of that has not yet had her pericardial window. We will get a stat echocardiogram and ask Dr. Servando Snare or one of his colleagues to evaluate her.  2. Acute on chronic diastolic CHF: She needs admission for IV diuresis. We will start her on IV Lasix to pull fluid off. We will have to keep a close eye on her blood pressure to make sure that she does not get hypotensive.  3. Chronic anticoagulation with Coumadin: Her INR was 3.6 today. Her INR has been greater than 3 the last 2 times it was checked. She has had a stroke in the past, so we'll hold her Coumadin and start her on heparin once INR is less than 2.0.   Current medicines are  reviewed at length with the patient today.  The patient does not have concerns regarding medicines.  The following changes have been made:  To be determined at discharge  Labs/ tests  ordered today include:   testing to be done in the hospital   Disposition: Admission to the hospital for IV diuresis and management of her effusion. After discharge, FU with Dr. Sallyanne Kuster  Signed, Lenoard Aden  01/15/2015 8:12 AM    [facility] [address], [place], [place]  [address] Phone: [phone]; Fax: [phone]

## 2015-01-15 NOTE — Patient Instructions (Signed)
Suanne Marker recommends that you be admitted.   Bed Control will call us with a bed within the next 30 minutes - 1 hour.    You can eat something!

## 2015-01-15 NOTE — Progress Notes (Signed)
Pt. Arrived to the unit via wheelchair. Pt. Is alert and oriented with no signs of distress noted. Pt. Vitals appear stable with no skin issues noted. Pt. Ambulated from wheelchair to the bed and tolerated well. Educated pt. On use of staff numbers, room telephone and call bell. Call light within reach. No further needs noted at this time.

## 2015-01-15 NOTE — Progress Notes (Signed)
Utilization review completed.  

## 2015-01-15 NOTE — Progress Notes (Signed)
  Echocardiogram 2D Echocardiogram has been performed.  Diamond Nickel 01/15/2015, 12:38 PM

## 2015-01-15 NOTE — Consult Note (Signed)
[address]       [place] [address]             [phone]        [name] Record [id number] Date of Birth: 11-Dec-1923  Referring: Dr. Sallyanne Kuster Primary Care: [name], MD  Chief Complaint: Shortness of breath with exertion, pericardial effusion  History of Present Illness:     This is an 81 year old Caucasian female with a past medical history of pericardial effusion (13'), chronic atrial fibrillation (on chronic Coumadin), hypertension, GI bleed (AVM), tachy brady syndrome (s/p PPM 15'), stroke who has had a pericardial effusion since June.  Most recent echo done 12/06/2014 showed a large pericardial effusion with no evidence of tamponade. She was scheduled for surgery, but developed shingles so she  Cancelled surgery . She developed worsening shortness of breath with exertion and lower extremity edema. She was seen by cardiology and admitted to [facility] for further evaluation and treatment. Repeat echo done today.   Current Activity/ Functional Status: Patient is independent with mobility/ambulation, transfers, ADL's, IADL's.   Zubrod Score: At the time of surgery this patient's most appropriate activity status/level should be described as: []     0    Normal activity, no symptoms []     1    Restricted in physical strenuous activity but ambulatory, able to do out light work [x]     2    Ambulatory and capable of self care, unable to do work activities, up and about  more than 50%  Of the time                            []     3    Only limited self care, in bed greater than 50% of waking hours []     4    Completely disabled, no self care, confined to bed or chair []     5    Moribund  Past Medical History  Diagnosis Date  . Pericardial effusion     Chronic. Last echo in April of 2012.   . Atrial fibrillation (HCC)     Chronic  . Chronic anticoagulation     on Coumadin  . History of GI bleed     once treated  with Fe infusion  . HTN (hypertension)   . Pacemaker     Medtronic, placed for tachybrady.   . Valvular heart disease   . Obesity   . LVH (left ventricular hypertrophy)   . Thrombophlebitis 1967    associated with pregnancy  . Diverticulosis   . Heart murmur   . Osteoporosis   . Hx of echocardiogram 07/28/2011    EF>55% small to moderate and is mostly located posteriorly. there is no overt evidence of tamponade although subtle diastolic collapse of the right ventricle.  . Sleep apnea     mild-no cpap  . History of blood transfusion "several times"  . CHF (congestive heart failure) ([place])   . Pneumonia ~ 1986  . Positive TB test   . Anemia   . Iron deficiency anemia     "I've had an iron transfusion"  . Stroke [facility]) April 2013    right frontal  . Arthritis   . Tachycardia-bradycardia syndrome [facility])     s/p Medtronic Sensia  model number [id number], serial number [id number] H 12/2013  . Acute on chronic diastolic CHF (congestive heart  failure), NYHA class 3 ([place]) 01/15/2015    Past Surgical History  Procedure Laterality Date  . Breast lumpectomy Bilateral     negative for cancer  . Esophagogastroduodenoscopy  05/19/2011    Procedure: ESOPHAGOGASTRODUODENOSCOPY (EGD);  Surgeon: Lear Ng, MD;  Location: [facility] ENDOSCOPY;  Service: Endoscopy;  Laterality: N/A;  doctor aware of inr   will try to be here no later than 230  . Cholecystectomy  2005  . Esophagogastroduodenoscopy  06/22/2011    Procedure: ESOPHAGOGASTRODUODENOSCOPY (EGD);  Surgeon: Arta Silence, MD;  Location: [facility] ENDOSCOPY;  Service: Endoscopy;  Laterality: N/A;  Check PT/INR in am  . Givens capsule study  06/23/2011    Procedure: GIVENS CAPSULE STUDY;  Surgeon: Arta Silence, MD;  Location: [facility] ENDOSCOPY;  Service: Endoscopy;  Laterality: N/A;  . Colonoscopy  08/11/2011    Procedure: COLONOSCOPY;  Surgeon: Jeryl Columbia, MD;  Location: [facility] ENDOSCOPY;  Service: Endoscopy;  Laterality: N/A;  . Hot hemostasis  08/11/2011     Procedure: HOT HEMOSTASIS (ARGON PLASMA COAGULATION/BICAP);  Surgeon: Jeryl Columbia, MD;  Location: [facility] ENDOSCOPY;  Service: Endoscopy;  Laterality: N/A;  . Mass excision Left 09/14/2013    Procedure: EXCISION MASS LEFT WRIST;  Surgeon: Leanora Cover, MD;  Location: [place];  Service: Orthopedics;  Laterality: Left;  . Tonsillectomy and adenoidectomy  1950  . Insert / replace / remove pacemaker  2001    Last generator in 2006; interrogated Dec 2012  . Cataract extraction w/ intraocular lens  implant, bilateral Bilateral   . Lipoma excision Left 07/2013    wrist  . Pacemaker generator change N/A 01/02/2014    Procedure: PACEMAKER GENERATOR CHANGE;  Surgeon: Sanda Klein, MD;  Location: [place] CATH LAB;  Service: Cardiovascular;  Laterality: N/A;  . Lead revision N/A 01/02/2014    Procedure: LEAD REVISION;  Surgeon: Sanda Klein, MD;  Location: [place] CATH LAB;  Service: Cardiovascular;  Laterality: N/A;    Social History   Social History  . Marital Status: Widowed    Spouse Name: N/A  . Number of Children: N/A  . Years of Education: N/A   Social History Main Topics  . Smoking status: Never Smoker   . Smokeless tobacco: Never Used  . Alcohol Use: 0.0 oz/week    0 Standard drinks or equivalent per week     Comment: 01/02/2014 "I'll have a drink a few times/year"  . Drug Use: No  . Sexual Activity: No    Allergies  Allergen Reactions  . Iodinated Diagnostic Agents Hives    HIVES 15MIN S/P IV CONTRAST INJECTION,WILL NEED 13 HR PREP FOR FUTURE INJECTIONS, ok s/p 50mg  po benadryl//a.calhoun  . Anti-Inflammatory Enzyme [Nutritional Supplements]     Retains fluids and headaches  . Arthrotec [Diclofenac-Misoprostol] Other (See Comments)    unknown  . Biaxin [Clarithromycin] Other (See Comments)    unknown  . Plavix [Clopidogrel Bisulfate] Other (See Comments)    unknown  . Spironolactone     Hair loss    Current Facility-Administered Medications  Medication Dose Route  Frequency Provider Last Rate Last Dose  . 0.9 %  sodium chloride infusion  250 mL Intravenous PRN Rhonda G Barrett, PA-C      . acetaminophen (TYLENOL) tablet 650 mg  650 mg Oral Q4H PRN Rhonda G Barrett, PA-C      . acetaminophen-codeine (TYLENOL #3) 300-30 MG per tablet 1-2 tablet  1-2 tablet Oral Q6H PRN Rhonda G Barrett, PA-C      . ALPRAZolam (  XANAX) tablet 0.25 mg  0.25 mg Oral BID PRN Lonn Georgia, PA-C      . [START ON 01/16/2015] digoxin (LANOXIN) tablet 0.125 mg  0.125 mg Oral Daily Rhonda G Barrett, PA-C      . furosemide (LASIX) injection 40 mg  40 mg Intravenous BID Rhonda G Barrett, PA-C      . insulin aspart (novoLOG) injection 0-15 Units  0-15 Units Subcutaneous TID WC Rhonda G Barrett, PA-C      . insulin aspart (novoLOG) injection 0-5 Units  0-5 Units Subcutaneous QHS Rhonda G Barrett, PA-C      . [START ON 01/16/2015] multivitamin with minerals tablet 1 tablet  1 tablet Oral Daily Rhonda G Barrett, PA-C      . nitroGLYCERIN (NITROSTAT) SL tablet 0.4 mg  0.4 mg Sublingual Q5 Min x 3 PRN Rhonda G Barrett, PA-C      . ondansetron (ZOFRAN) injection 4 mg  4 mg Intravenous Q6H PRN Rhonda G Barrett, PA-C      . potassium chloride SA (K-DUR,KLOR-CON) CR tablet 20 mEq  20 mEq Oral BID Rhonda G Barrett, PA-C      . sodium chloride 0.9 % injection 3 mL  3 mL Intravenous Q12H Rhonda G Barrett, PA-C      . sodium chloride 0.9 % injection 3 mL  3 mL Intravenous PRN Rhonda G Barrett, PA-C      . verapamil (CALAN-SR) CR tablet 180 mg  180 mg Oral QHS Rhonda G Barrett, PA-C      . zolpidem (AMBIEN) tablet 5 mg  5 mg Oral QHS PRN Lonn Georgia, PA-C        Prescriptions prior to admission  Medication Sig Dispense Refill Last Dose  . acetaminophen-codeine (TYLENOL #3) 300-30 MG tablet Take 1-2 tablets by mouth every 6 (six) hours as needed for moderate pain (shingles). 100 tablet 0 Taking  . codeine 15 MG tablet Take 1 tablet (15 mg total) by mouth every 6 (six) hours as needed. 30  tablet 0 Taking  . DIGITEK 125 MCG tablet TAKE 1 TABLET(S) BY MOUTH DAILY 30 tablet 5 Taking  . multivitamin (THERAGRAN) per tablet Take 1 tablet by mouth daily.     Taking  . potassium chloride (K-DUR,KLOR-CON) 10 MEQ tablet take 2 tablets by mouth once daily WHEN YOU TAKE TORSEMIDE 180 tablet 2 Taking  . torsemide (DEMADEX) 20 MG tablet Take 2 tablets (40 mg total) by mouth daily. 60 tablet 10 Taking  . valACYclovir (VALTREX) 1000 MG tablet Take 1 tablet (1,000 mg total) by mouth 3 (three) times daily. 30 tablet 0 Taking  . verapamil (CALAN-SR) 180 MG CR tablet Take 1 tablet (180 mg total) by mouth at bedtime. 30 tablet 10 Taking  . warfarin (COUMADIN) 5 MG tablet Take 1 tablet by mouth daily or as directed by coumadin clinic 90 tablet 1 Taking    Family History  Problem Relation Age of Onset  . Stroke Mother   . Stroke Father   . Pneumonia Father    Review of Systems:     Cardiac Review of Systems: Y or N  Chest Pain [  N  ]  Resting SOB [ N  ] Exertional SOB  [ Y ] Pedal Edema [ Y  ]   Palpitations [  N] Syncope  Aqua.Slicker  ]   Presyncope [  N ]  General Review of Systems: [Y] = yes [ N ]=no Constitional: recent weight change [ Y ];  fatigue [  Y ]; nausea [  N]; night sweats [ N ]; fever [ N ]; or chills [ N ]                                                                Resp: cough [ N ];  wheezing[ N ];  hemoptysis[ N ];  GI:  vomiting[ N ];  dysphagia[ N ]; melena[N  ];  hematochezia [ N ];  GU: hematuria[ N ];   dysuria Aqua.Slicker  ];  nocturia[ N ];              Heme/Lymph: anemia[Y  ];   Endocrine: pre diabetes[Y  ];  thyroid dysfunction[N  ];    Physical Exam: BP 119/59 mmHg  Pulse 59  Temp(Src) 98.4 F (36.9 C) (Oral)  Resp 20  Wt 160 lb 11.2 oz (72.893 kg)  SpO2 97%   General appearance: alert, cooperative and no distress Neck: no adenopathy, no carotid bruit, supple, symmetrical, trachea midline, thyroid not enlarged, symmetric, no tenderness/mass/nodules and JVD Resp: clear  to auscultation bilaterally Cardio: Paced, grade II/VI systolic murmur GI: soft, non-tender; bowel sounds normal; no masses,  no organomegaly Extremities: 2+ bilateral lower extremity edema Neurologic: Grossly normal  Diagnostic Studies & Laboratory data:     Recent Radiology Findings:   Echo 12/20: LVEF 55-60%, a large free flowing pericardial effusion but no tamponade.   I have independently reviewed the above radiologic studies.  Recent Lab Findings: Lab Results  Component Value Date   WBC 4.9 03/20/2014   HGB 13.5 03/20/2014   HCT 39.1 03/20/2014   PLT 233 03/20/2014   GLUCOSE 96 01/08/2015   CHOL 160 08/03/2013   TRIG 114 08/03/2013   HDL 37* 08/03/2013   LDLCALC 100* 08/03/2013   ALT 19 07/17/2014   AST 18 07/17/2014   NA 140 01/08/2015   K 4.1 01/08/2015   CL 97* 01/08/2015   CREATININE 1.08* 01/08/2015   BUN 18 01/08/2015   CO2 34* 01/08/2015   TSH 2.154 12/28/2013   INR 3.7 01/08/2015   HGBA1C 6.8 07/17/2014   Assessment / Plan:    1.Pericardial effusion-will have subxiphoid pericardial window in the am  2. On chronic coumadin for a fib.  INR is 3.25. I will,give 2 mg Vitamin K.  Plan subxiphiod pericardial window in the am. The goals risks and alternatives of the planned surgical procedure subxiphiod pericardial window  have been discussed with the patient in detail. The risks of the procedure including death, infection, stroke, myocardial infarction, bleeding, blood transfusion have all been discussed specifically.  I have quoted [name] a 3 % of perioperative mortality and a complication rate as high as 20%. The patient's questions have been answered.[name] is willing  to proceed with the planned procedure.  Grace Isaac MD      [address] [place] [address] Office [phone]   [place]

## 2015-01-16 ENCOUNTER — Inpatient Hospital Stay (HOSPITAL_COMMUNITY): Payer: Medicare Other

## 2015-01-16 ENCOUNTER — Inpatient Hospital Stay (HOSPITAL_COMMUNITY)
Admission: AD | Admit: 2015-01-16 | Discharge: 2015-01-16 | Disposition: A | Discharge status: Home / Self Care | Payer: Medicare Other | Source: Ambulatory Visit | Attending: Cardiothoracic Surgery | Admitting: Cardiothoracic Surgery

## 2015-01-16 ENCOUNTER — Inpatient Hospital Stay (HOSPITAL_COMMUNITY): Payer: Medicare Other | Admitting: Anesthesiology

## 2015-01-16 ENCOUNTER — Encounter (HOSPITAL_COMMUNITY)
Admission: AD | Disposition: A | Discharge status: Home / Self Care | Payer: Self-pay | Source: Ambulatory Visit | Attending: Cardiovascular Disease

## 2015-01-16 ENCOUNTER — Encounter (HOSPITAL_COMMUNITY): Payer: Self-pay | Admitting: Certified Registered Nurse Anesthetist

## 2015-01-16 DIAGNOSIS — I313 Pericardial effusion (noninflammatory): Secondary | ICD-10-CM

## 2015-01-16 DIAGNOSIS — I3139 Other pericardial effusion (noninflammatory): Secondary | ICD-10-CM | POA: Diagnosis present

## 2015-01-16 HISTORY — PX: SUBXYPHOID PERICARDIAL WINDOW: SHX5075

## 2015-01-16 HISTORY — PX: TEE WITHOUT CARDIOVERSION: SHX5443

## 2015-01-16 LAB — GLUCOSE, CAPILLARY
GLUCOSE-CAPILLARY: 130 mg/dL — AB (ref 65–99)
Glucose-Capillary: 101 mg/dL — ABNORMAL HIGH (ref 65–99)
Glucose-Capillary: 133 mg/dL — ABNORMAL HIGH (ref 65–99)

## 2015-01-16 LAB — BASIC METABOLIC PANEL
Anion gap: 11 (ref 5–15)
BUN: 12 mg/dL (ref 6–20)
CO2: 32 mmol/L (ref 22–32)
CREATININE: 1.15 mg/dL — AB (ref 0.44–1.00)
Calcium: 9.4 mg/dL (ref 8.9–10.3)
Chloride: 98 mmol/L — ABNORMAL LOW (ref 101–111)
GFR calc Af Amer: 47 mL/min — ABNORMAL LOW (ref >60)
GFR, EST NON AFRICAN AMERICAN: 41 mL/min — AB (ref >60)
GLUCOSE: 111 mg/dL — AB (ref 65–99)
POTASSIUM: 3.5 mmol/L (ref 3.5–5.1)
SODIUM: 141 mmol/L (ref 135–145)

## 2015-01-16 LAB — PROTIME-INR
INR: 2.39 — AB (ref 0.00–1.49)
PROTHROMBIN TIME: 25.8 s — AB (ref 11.6–15.2)

## 2015-01-16 LAB — BODY FLUID CELL COUNT WITH DIFFERENTIAL
Lymphs, Fluid: 3 %
Monocyte-Macrophage-Serous Fluid: 94 % — ABNORMAL HIGH (ref 50–90)
Neutrophil Count, Fluid: 3 % (ref 0–25)
Total Nucleated Cell Count, Fluid: 129 cu mm (ref 0–1000)

## 2015-01-16 LAB — PREPARE RBC (CROSSMATCH)

## 2015-01-16 LAB — GRAM STAIN

## 2015-01-16 LAB — HEMOGLOBIN A1C
HEMOGLOBIN A1C: 6.2 % — AB (ref 4.8–5.6)
MEAN PLASMA GLUCOSE: 131 mg/dL

## 2015-01-16 SURGERY — CREATION, PERICARDIAL WINDOW, SUBXIPHOID APPROACH
Anesthesia: General | Site: Chest

## 2015-01-16 MED ORDER — PROPOFOL 10 MG/ML IV BOLUS
INTRAVENOUS | Status: AC
  Filled 2015-01-16: qty 20

## 2015-01-16 MED ORDER — DEXTROSE 5 % IV SOLN
1.5000 g | Freq: Two times a day (BID) | INTRAVENOUS | Status: AC
  Administered 2015-01-16: 1.5 g via INTRAVENOUS
  Filled 2015-01-16 (×3): qty 1.5

## 2015-01-16 MED ORDER — VECURONIUM BROMIDE 10 MG IV SOLR
INTRAVENOUS | Status: DC | PRN
  Administered 2015-01-16: 4 mg via INTRAVENOUS

## 2015-01-16 MED ORDER — SUCCINYLCHOLINE CHLORIDE 20 MG/ML IJ SOLN
INTRAMUSCULAR | Status: DC | PRN
  Administered 2015-01-16: 100 mg via INTRAVENOUS

## 2015-01-16 MED ORDER — DEXTROSE 5 % IV SOLN: 1.5000 g | INTRAVENOUS | Status: DC

## 2015-01-16 MED ORDER — SODIUM CHLORIDE 0.9 % IJ SOLN
INTRAMUSCULAR | Status: AC
  Filled 2015-01-16: qty 10

## 2015-01-16 MED ORDER — OXYCODONE HCL 5 MG PO TABS
5.0000 mg | ORAL_TABLET | ORAL | Status: DC | PRN
  Administered 2015-01-16: 5 mg via ORAL
  Filled 2015-01-16 (×3): qty 1

## 2015-01-16 MED ORDER — ETOMIDATE 2 MG/ML IV SOLN
INTRAVENOUS | Status: AC
  Filled 2015-01-16: qty 10

## 2015-01-16 MED ORDER — ONDANSETRON HCL 4 MG/2ML IJ SOLN: 4.0000 mg | Freq: Four times a day (QID) | INTRAMUSCULAR | Status: DC | PRN

## 2015-01-16 MED ORDER — SENNA 8.6 MG PO TABS
1.0000 | ORAL_TABLET | Freq: Every day | ORAL | Status: DC
  Administered 2015-01-17 – 2015-01-22 (×6): 8.6 mg via ORAL
  Filled 2015-01-16 (×8): qty 1

## 2015-01-16 MED ORDER — TRAMADOL HCL 50 MG PO TABS
50.0000 mg | ORAL_TABLET | Freq: Four times a day (QID) | ORAL | Status: DC | PRN
  Administered 2015-01-17: 50 mg via ORAL
  Filled 2015-01-16: qty 1

## 2015-01-16 MED ORDER — VERAPAMIL HCL ER 180 MG PO TBCR
180.0000 mg | EXTENDED_RELEASE_TABLET | Freq: Every day | ORAL | Status: DC
  Administered 2015-01-17 – 2015-01-25 (×9): 180 mg via ORAL
  Filled 2015-01-16 (×14): qty 1

## 2015-01-16 MED ORDER — FENTANYL CITRATE (PF) 100 MCG/2ML IJ SOLN
INTRAMUSCULAR | Status: AC
  Filled 2015-01-16: qty 2

## 2015-01-16 MED ORDER — FENTANYL CITRATE (PF) 100 MCG/2ML IJ SOLN
INTRAMUSCULAR | Status: DC | PRN
  Administered 2015-01-16 (×2): 50 ug via INTRAVENOUS

## 2015-01-16 MED ORDER — SUGAMMADEX SODIUM 200 MG/2ML IV SOLN
INTRAVENOUS | Status: DC | PRN
  Administered 2015-01-16: 200 mg via INTRAVENOUS

## 2015-01-16 MED ORDER — ETOMIDATE 2 MG/ML IV SOLN
INTRAVENOUS | Status: DC | PRN
  Administered 2015-01-16: 10 mg via INTRAVENOUS

## 2015-01-16 MED ORDER — ACETAMINOPHEN 160 MG/5ML PO SOLN
1000.0000 mg | Freq: Four times a day (QID) | ORAL | Status: AC
  Filled 2015-01-16: qty 40.6

## 2015-01-16 MED ORDER — EPHEDRINE SULFATE 50 MG/ML IJ SOLN
INTRAMUSCULAR | Status: DC | PRN
  Administered 2015-01-16 (×3): 5 mg via INTRAVENOUS

## 2015-01-16 MED ORDER — EPHEDRINE SULFATE 50 MG/ML IJ SOLN
INTRAMUSCULAR | Status: AC
  Filled 2015-01-16: qty 1

## 2015-01-16 MED ORDER — LACTATED RINGERS IV SOLN
INTRAVENOUS | Status: DC | PRN
Start: 2015-01-16 — End: 2015-01-16
  Administered 2015-01-16: 08:00:00 via INTRAVENOUS

## 2015-01-16 MED ORDER — SUGAMMADEX SODIUM 200 MG/2ML IV SOLN
INTRAVENOUS | Status: AC
  Filled 2015-01-16: qty 2

## 2015-01-16 MED ORDER — FENTANYL CITRATE (PF) 250 MCG/5ML IJ SOLN
INTRAMUSCULAR | Status: AC
  Filled 2015-01-16: qty 5

## 2015-01-16 MED ORDER — FENTANYL CITRATE (PF) 100 MCG/2ML IJ SOLN
25.0000 ug | INTRAMUSCULAR | Status: DC | PRN
  Administered 2015-01-16: 25 ug via INTRAVENOUS

## 2015-01-16 MED ORDER — POTASSIUM CHLORIDE 10 MEQ/50ML IV SOLN: 10.0000 meq | Freq: Every day | INTRAVENOUS | Status: DC | PRN

## 2015-01-16 MED ORDER — INSULIN ASPART 100 UNIT/ML ~~LOC~~ SOLN
0.0000 [IU] | Freq: Four times a day (QID) | SUBCUTANEOUS | Status: DC
  Administered 2015-01-16 – 2015-01-17 (×3): 2 [IU] via SUBCUTANEOUS

## 2015-01-16 MED ORDER — ONDANSETRON HCL 4 MG/2ML IJ SOLN: 4.0000 mg | Freq: Once | INTRAMUSCULAR | Status: DC | PRN

## 2015-01-16 MED ORDER — CETYLPYRIDINIUM CHLORIDE 0.05 % MT LIQD
7.0000 mL | Freq: Two times a day (BID) | OROMUCOSAL | Status: DC
  Administered 2015-01-17 – 2015-01-26 (×17): 7 mL via OROMUCOSAL

## 2015-01-16 MED ORDER — FENTANYL CITRATE (PF) 100 MCG/2ML IJ SOLN
INTRAMUSCULAR | Status: AC
  Administered 2015-01-16: 25 ug via INTRAVENOUS
  Filled 2015-01-16: qty 2

## 2015-01-16 MED ORDER — FENTANYL CITRATE (PF) 100 MCG/2ML IJ SOLN
12.5000 ug | INTRAMUSCULAR | Status: DC | PRN
Start: 2015-01-16 — End: 2015-01-16
  Administered 2015-01-16 (×2): 25 ug via INTRAVENOUS
  Filled 2015-01-16: qty 2

## 2015-01-16 MED ORDER — FENTANYL CITRATE (PF) 100 MCG/2ML IJ SOLN
25.0000 ug | Freq: Once | INTRAMUSCULAR | Status: AC
  Administered 2015-01-16: 25 ug via INTRAVENOUS

## 2015-01-16 MED ORDER — POTASSIUM CHLORIDE IN NACL 20-0.9 MEQ/L-% IV SOLN
INTRAVENOUS | Status: DC
  Administered 2015-01-16: 13:00:00 via INTRAVENOUS
  Filled 2015-01-16 (×3): qty 1000

## 2015-01-16 MED ORDER — 0.9 % SODIUM CHLORIDE (POUR BTL) OPTIME
TOPICAL | Status: DC | PRN
  Administered 2015-01-16: 1000 mL

## 2015-01-16 MED ORDER — LIDOCAINE HCL (CARDIAC) 20 MG/ML IV SOLN
INTRAVENOUS | Status: DC | PRN
  Administered 2015-01-16: 100 mg via INTRAVENOUS

## 2015-01-16 MED ORDER — ACETAMINOPHEN 500 MG PO TABS
1000.0000 mg | ORAL_TABLET | Freq: Four times a day (QID) | ORAL | Status: AC
  Administered 2015-01-17 – 2015-01-20 (×7): 1000 mg via ORAL
  Filled 2015-01-16 (×8): qty 2

## 2015-01-16 MED ORDER — SODIUM CHLORIDE 0.9 % IV SOLN: Freq: Once | INTRAVENOUS | Status: DC

## 2015-01-16 SURGICAL SUPPLY — 46 items
ADH SKN CLS APL DERMABOND .7 (GAUZE/BANDAGES/DRESSINGS) ×2
CANISTER SUCTION 2500CC (MISCELLANEOUS) ×4 IMPLANT
CATH THORACIC 28FR (CATHETERS) IMPLANT
CATH THORACIC 28FR RT ANG (CATHETERS) IMPLANT
CONN ST 1/4X3/8  BEN (MISCELLANEOUS) ×2
CONN ST 1/4X3/8 BEN (MISCELLANEOUS) ×2 IMPLANT
CONT SPEC 4OZ CLIKSEAL STRL BL (MISCELLANEOUS) ×4 IMPLANT
DERMABOND ADVANCED (GAUZE/BANDAGES/DRESSINGS) ×2
DERMABOND ADVANCED .7 DNX12 (GAUZE/BANDAGES/DRESSINGS) ×2 IMPLANT
DRAIN CHANNEL 28F RND 3/8 FF (WOUND CARE) ×4 IMPLANT
DRAPE LAPAROSCOPIC ABDOMINAL (DRAPES) ×4 IMPLANT
ELECT BLADE 4.0 EZ CLEAN MEGAD (MISCELLANEOUS) ×4
ELECT BLADE 6.5 EXT (BLADE) ×2 IMPLANT
ELECT REM PT RETURN 9FT ADLT (ELECTROSURGICAL) ×4
ELECTRODE BLDE 4.0 EZ CLN MEGD (MISCELLANEOUS) IMPLANT
ELECTRODE REM PT RTRN 9FT ADLT (ELECTROSURGICAL) ×2 IMPLANT
GAUZE SPONGE 4X4 12PLY STRL (GAUZE/BANDAGES/DRESSINGS) ×2 IMPLANT
GLOVE BIO SURGEON STRL SZ 6.5 (GLOVE) ×6 IMPLANT
GLOVE BIO SURGEONS STRL SZ 6.5 (GLOVE) ×2
HEMOSTAT POWDER SURGIFOAM 1G (HEMOSTASIS) IMPLANT
KIT BASIN OR (CUSTOM PROCEDURE TRAY) ×4 IMPLANT
KIT ROOM TURNOVER OR (KITS) ×4 IMPLANT
NDL 18GX1X1/2 (RX/OR ONLY) (NEEDLE) IMPLANT
NEEDLE 18GX1X1/2 (RX/OR ONLY) (NEEDLE) ×4 IMPLANT
NS IRRIG 1000ML POUR BTL (IV SOLUTION) ×8 IMPLANT
PACK CHEST (CUSTOM PROCEDURE TRAY) ×4 IMPLANT
PAD ARMBOARD 7.5X6 YLW CONV (MISCELLANEOUS) ×8 IMPLANT
PAD ELECT DEFIB RADIOL ZOLL (MISCELLANEOUS) ×4 IMPLANT
SPONGE GAUZE 4X4 12PLY STER LF (GAUZE/BANDAGES/DRESSINGS) ×2 IMPLANT
SUT SILK  1 MH (SUTURE) ×2
SUT SILK 1 MH (SUTURE) IMPLANT
SUT VIC AB 1 CTX 18 (SUTURE) ×4 IMPLANT
SUT VIC AB 2-0 CTX 27 (SUTURE) ×4 IMPLANT
SUT VIC AB 3-0 X1 27 (SUTURE) ×4 IMPLANT
SWAB COLLECTION DEVICE MRSA (MISCELLANEOUS) IMPLANT
SYR 50ML SLIP (SYRINGE) IMPLANT
SYRINGE 10CC LL (SYRINGE) IMPLANT
SYRINGE 20CC LL (MISCELLANEOUS) ×2 IMPLANT
SYSTEM SAHARA CHEST DRAIN ATS (WOUND CARE) ×4 IMPLANT
TAPE CLOTH SURG 4X10 WHT LF (GAUZE/BANDAGES/DRESSINGS) ×2 IMPLANT
TOWEL OR 17X24 6PK STRL BLUE (TOWEL DISPOSABLE) ×4 IMPLANT
TOWEL OR 17X26 10 PK STRL BLUE (TOWEL DISPOSABLE) ×4 IMPLANT
TRAP SPECIMEN MUCOUS 40CC (MISCELLANEOUS) ×10 IMPLANT
TRAY FOLEY CATH 16FRSI W/METER (SET/KITS/TRAYS/PACK) ×4 IMPLANT
TUBE ANAEROBIC SPECIMEN COL (MISCELLANEOUS) IMPLANT
WATER STERILE IRR 1000ML POUR (IV SOLUTION) ×8 IMPLANT

## 2015-01-16 NOTE — Progress Notes (Signed)
01/16/2015 7:04 PM Nursing note Pt. Voicing concerns about pain management now that fentanyl had been d/c from [facility]. Dr. Servando Snare on floor and verbal orders received to [facility] RN to repeat prior ordered fentanyl dose x 1. Orders enacted. Pt. Received fentanyl 79mcg IV x1. Pt. States decreased pain. Will continue to monitor patient.  Dierra Riesgo, Arville Lime

## 2015-01-16 NOTE — Brief Op Note (Addendum)
      [address]       [place] [address]             [phone]   01/16/2015  9:37 AM  PATIENT:  [name]  81 y.o. female  PRE-OPERATIVE DIAGNOSIS:  LARGE PERICARDIAL EFFUSION  POST-OPERATIVE DIAGNOSIS:  LARGE PERICARDIAL EFFUSION  PROCEDURE:  TRANSESOPHAGEAL ECHOCARDIOGRAM (TEE), SUBXYPHOID PERICARDIAL WINDOW   FINDINGS:  Approximately 1100 cc of pericardial fluid (light yellow) removed  SURGEON:  Surgeon(s) and Role:    * Grace Isaac, MD - Primary  PHYSICIAN ASSISTANT: Lars Pinks PA-C  ANESTHESIA:   general  EBL:   10 cc  BLOOD ADMINISTERED:none  DRAINS: (20) Blake drain(s) in the pericardial space   SPECIMEN:  Source of Specimen:  Pericardial fluid and pericardial biopsy  DISPOSITION OF SPECIMEN:  PATHOLOGY and cultures  COUNTS CORRECT:  YES  DICTATION: .Dragon Dictation  PLAN OF CARE: Admit to inpatient   PATIENT DISPOSITION:  PACU - hemodynamically stable.   Delay start of Pharmacological VTE agent (>24hrs) due to surgical blood loss or risk of bleeding: yes

## 2015-01-16 NOTE — Progress Notes (Signed)
  Echocardiogram Echocardiogram Transesophageal has been performed.  Bobbye Charleston 01/16/2015, 10:37 AM

## 2015-01-16 NOTE — Anesthesia Procedure Notes (Signed)
Procedure Name: Intubation Date/Time: 01/16/2015 8:58 AM Performed by: Rejeana Brock L Pre-anesthesia Checklist: Patient identified, Timeout performed, Emergency Drugs available, Suction available and Patient being monitored Patient Re-evaluated:Patient Re-evaluated prior to inductionOxygen Delivery Method: Circle system utilized Preoxygenation: Pre-oxygenation with 100% oxygen Intubation Type: IV induction Ventilation: Mask ventilation without difficulty Laryngoscope Size: Mac and 3 Grade View: Grade II Tube size: 7.5 mm Number of attempts: 1 Airway Equipment and Method: Stylet Placement Confirmation: ETT inserted through vocal cords under direct vision,  positive ETCO2 and breath sounds checked- equal and bilateral Secured at: 21 cm Tube secured with: Tape Dental Injury: Teeth and Oropharynx as per pre-operative assessment

## 2015-01-16 NOTE — Anesthesia Postprocedure Evaluation (Signed)
Anesthesia Post Note  Patient: [name]  Procedure(s) Performed: Procedure(s) (LRB): SUBXYPHOID PERICARDIAL WINDOW (N/A) TRANSESOPHAGEAL ECHOCARDIOGRAM (TEE) (N/A)  Patient location during evaluation: PACU Anesthesia Type: General Level of consciousness: awake and alert, awake, oriented and patient cooperative Pain management: pain level controlled Vital Signs Assessment: post-procedure vital signs reviewed and stable Respiratory status: spontaneous breathing, nonlabored ventilation, respiratory function stable and patient connected to nasal cannula oxygen Cardiovascular status: blood pressure returned to baseline and stable Postop Assessment: no signs of nausea or vomiting Anesthetic complications: no    Last Vitals:  Filed Vitals:   01/16/15 1500 01/16/15 1531  BP: 112/50   Pulse: 59   Temp:  36.7 C  Resp: 26     Last Pain:  Filed Vitals:   01/16/15 1531  PainSc: 0-No pain                 Catalina Gravel

## 2015-01-16 NOTE — Progress Notes (Signed)
Patient ID: [name], female   DOB: 03-04-1923, 81 y.o.   MRN: [id number] EVENING ROUNDS NOTE :     [address]       [place] [address]             [phone]                 Day of Surgery Procedure(s) (LRB): SUBXYPHOID PERICARDIAL WINDOW (N/A) TRANSESOPHAGEAL ECHOCARDIOGRAM (TEE) (N/A)  Total Length of Stay:  LOS: 1 day  BP 122/56 mmHg  Pulse 58  Temp(Src) 98 F (36.7 C) (Oral)  Resp 19  Wt 153 lb (69.4 kg)  SpO2 90%  .Intake/Output      12/20 0701 - 12/21 0700 12/21 0701 - 12/22 0700   P.O. 240    I.V. (mL/kg)  1325 (19.1)   Total Intake(mL/kg) 240 (3.5) 1325 (19.1)   Urine (mL/kg/hr) 2100 540 (0.7)   Blood  30 (0)   Chest Tube  280 (0.4)   Total Output 2100 850   Net -1860 +475          . 0.9 % NaCl with KCl 20 mEq / L 75 mL/hr at 01/16/15 1800     Lab Results  Component Value Date   WBC 5.7 01/15/2015   HGB 11.9* 01/15/2015   HCT 38.0 01/15/2015   PLT 248 01/15/2015   GLUCOSE 111* 01/16/2015   CHOL 160 08/03/2013   TRIG 114 08/03/2013   HDL 37* 08/03/2013   LDLCALC 100* 08/03/2013   ALT 18 01/15/2015   AST 28 01/15/2015   NA 141 01/16/2015   K 3.5 01/16/2015   CL 98* 01/16/2015   CREATININE 1.15* 01/16/2015   BUN 12 01/16/2015   CO2 32 01/16/2015   TSH 1.989 01/15/2015   INR 2.39* 01/16/2015   HGBA1C 6.2* 01/15/2015   Stable post pericardial window 300 ml more out not bloody Taking po well will decrease iv fluid  Grace Isaac MD  Beeper [phone] Office [phone] 01/16/2015 6:29 PM

## 2015-01-16 NOTE — Transfer of Care (Signed)
Immediate Anesthesia Transfer of Care Note  Patient: [name]  Procedure(s) Performed: Procedure(s): SUBXYPHOID PERICARDIAL WINDOW (N/A) TRANSESOPHAGEAL ECHOCARDIOGRAM (TEE) (N/A)  Patient Location: PACU  Anesthesia Type:General  Level of Consciousness: awake  Airway & Oxygen Therapy: Patient Spontanous Breathing and Patient connected to face mask oxygen  Post-op Assessment: Report given to RN, Post -op Vital signs reviewed and stable and Patient moving all extremities X 4  Post vital signs: stable  Last Vitals:  Filed Vitals:   01/15/15 2055 01/16/15 0457  BP: 122/58 117/47  Pulse: 63 62  Temp: 37.1 C 36.8 C  Resp: 18 18    Complications: No apparent anesthesia complications

## 2015-01-17 ENCOUNTER — Inpatient Hospital Stay (HOSPITAL_COMMUNITY): Payer: Medicare Other

## 2015-01-17 ENCOUNTER — Encounter (HOSPITAL_COMMUNITY): Payer: Self-pay | Admitting: Cardiothoracic Surgery

## 2015-01-17 DIAGNOSIS — I319 Disease of pericardium, unspecified: Secondary | ICD-10-CM

## 2015-01-17 DIAGNOSIS — Z7901 Long term (current) use of anticoagulants: Secondary | ICD-10-CM

## 2015-01-17 DIAGNOSIS — I5033 Acute on chronic diastolic (congestive) heart failure: Secondary | ICD-10-CM

## 2015-01-17 LAB — CBC
HCT: 38 % (ref 36.0–46.0)
Hemoglobin: 11.8 g/dL — ABNORMAL LOW (ref 12.0–15.0)
MCH: 28.6 pg (ref 26.0–34.0)
MCHC: 31.1 g/dL (ref 30.0–36.0)
MCV: 92.2 fL (ref 78.0–100.0)
PLATELETS: 240 10*3/uL (ref 150–400)
RBC: 4.12 MIL/uL (ref 3.87–5.11)
RDW: 15.1 % (ref 11.5–15.5)
WBC: 8.8 10*3/uL (ref 4.0–10.5)

## 2015-01-17 LAB — MISC LABCORP TEST (SEND OUT)
LABCORP TEST CODE: 100156
LABCORP TEST CODE: 19497
Labcorp test code: 19588

## 2015-01-17 LAB — BASIC METABOLIC PANEL
ANION GAP: 11 (ref 5–15)
BUN: 10 mg/dL (ref 6–20)
CALCIUM: 8.8 mg/dL — AB (ref 8.9–10.3)
CO2: 30 mmol/L (ref 22–32)
CREATININE: 1.01 mg/dL — AB (ref 0.44–1.00)
Chloride: 98 mmol/L — ABNORMAL LOW (ref 101–111)
GFR calc Af Amer: 55 mL/min — ABNORMAL LOW (ref >60)
GFR, EST NON AFRICAN AMERICAN: 48 mL/min — AB (ref >60)
GLUCOSE: 112 mg/dL — AB (ref 65–99)
Potassium: 3.6 mmol/L (ref 3.5–5.1)
Sodium: 139 mmol/L (ref 135–145)

## 2015-01-17 LAB — PROTIME-INR
INR: 1.71 — ABNORMAL HIGH (ref 0.00–1.49)
PROTHROMBIN TIME: 20.1 s — AB (ref 11.6–15.2)

## 2015-01-17 LAB — TYPE AND SCREEN
ABO/RH(D): AB POS
Antibody Screen: NEGATIVE
Unit division: 0
Unit division: 0

## 2015-01-17 LAB — BLOOD GAS, ARTERIAL
Acid-Base Excess: 7.1 mmol/L — ABNORMAL HIGH (ref 0.0–2.0)
BICARBONATE: 31.3 meq/L — AB (ref 20.0–24.0)
DRAWN BY: 252031
O2 CONTENT: 4 L/min
O2 Saturation: 93.1 %
PCO2 ART: 46.1 mmHg — AB (ref 35.0–45.0)
PH ART: 7.447 (ref 7.350–7.450)
PO2 ART: 65.3 mmHg — AB (ref 80.0–100.0)
Patient temperature: 98.6
TCO2: 32.7 mmol/L (ref 0–100)

## 2015-01-17 LAB — GLUCOSE, CAPILLARY
GLUCOSE-CAPILLARY: 136 mg/dL — AB (ref 65–99)
GLUCOSE-CAPILLARY: 110 mg/dL — AB (ref 65–99)
Glucose-Capillary: 110 mg/dL — ABNORMAL HIGH (ref 65–99)

## 2015-01-17 LAB — MRSA PCR SCREENING: MRSA by PCR: NEGATIVE

## 2015-01-17 LAB — PATHOLOGIST SMEAR REVIEW

## 2015-01-17 MED ORDER — WARFARIN SODIUM 2.5 MG PO TABS
2.5000 mg | ORAL_TABLET | Freq: Once | ORAL | Status: AC
  Administered 2015-01-17: 2.5 mg via ORAL
  Filled 2015-01-17: qty 1

## 2015-01-17 MED ORDER — HYDROCODONE-ACETAMINOPHEN 5-325 MG PO TABS
1.0000 | ORAL_TABLET | Freq: Four times a day (QID) | ORAL | Status: DC | PRN
  Administered 2015-01-17 – 2015-01-24 (×4): 1 via ORAL
  Filled 2015-01-17 (×5): qty 1

## 2015-01-17 MED ORDER — LIDOCAINE 5 % EX PTCH
1.0000 | MEDICATED_PATCH | CUTANEOUS | Status: DC
  Administered 2015-01-17 – 2015-01-26 (×10): 1 via TRANSDERMAL
  Filled 2015-01-17 (×11): qty 1

## 2015-01-17 MED ORDER — ENOXAPARIN SODIUM 30 MG/0.3ML ~~LOC~~ SOLN
30.0000 mg | Freq: Two times a day (BID) | SUBCUTANEOUS | Status: DC
  Administered 2015-01-17 – 2015-01-20 (×7): 30 mg via SUBCUTANEOUS
  Filled 2015-01-17 (×7): qty 0.3

## 2015-01-17 MED ORDER — TRAMADOL HCL 50 MG PO TABS
50.0000 mg | ORAL_TABLET | Freq: Four times a day (QID) | ORAL | Status: DC | PRN
  Administered 2015-01-17: 100 mg via ORAL
  Administered 2015-01-19 – 2015-01-22 (×2): 50 mg via ORAL
  Filled 2015-01-17: qty 1
  Filled 2015-01-17: qty 2
  Filled 2015-01-17: qty 1

## 2015-01-17 MED ORDER — WARFARIN - PHYSICIAN DOSING INPATIENT: Freq: Every day | Status: DC

## 2015-01-17 NOTE — Progress Notes (Signed)
TCTS BRIEF SICU PROGRESS NOTE  1 Day Post-Op  S/P Procedure(s) (LRB): SUBXYPHOID PERICARDIAL WINDOW (N/A) TRANSESOPHAGEAL ECHOCARDIOGRAM (TEE) (N/A)   Stable day Paced rhythm w/ stable BP Ambulated around unit UOP adequate  Plan: Continue current plan  Rexene Alberts, MD 01/17/2015 7:36 PM

## 2015-01-17 NOTE — Progress Notes (Signed)
Patient ID: [name], female   DOB: Aug 24, 1923, 81 y.o.   MRN: [id number] TCTS DAILY ICU PROGRESS NOTE                   [address] [place]            [place] [address]          [phone]   1 Day Post-Op Procedure(s) (LRB): SUBXYPHOID PERICARDIAL WINDOW (N/A) TRANSESOPHAGEAL ECHOCARDIOGRAM (TEE) (N/A)  Total Length of Stay:  LOS: 2 days   Subjective: Alert and awake, adequate pain control, does not like oxyir  Objective: Vital signs in last 24 hours: Temp:  [97.3 F (36.3 C)-98.3 F (36.8 C)] 97.8 F (36.6 C) (12/22 0400) Pulse Rate:  [57-120] 59 (12/22 0700) Cardiac Rhythm:  [-] Ventricular paced (12/22 0700) Resp:  [12-43] 29 (12/22 0700) BP: (98-131)/(48-93) 129/51 mmHg (12/22 0700) SpO2:  [90 %-96 %] 93 % (12/22 0700) Arterial Line BP: (105-145)/(43-68) 122/53 mmHg (12/22 0700) Weight:  [158 lb 11.7 oz (72 kg)] 158 lb 11.7 oz (72 kg) (12/22 0400)  Filed Weights   01/15/15 1049 01/16/15 0503 01/17/15 0400  Weight: 160 lb 11.2 oz (72.893 kg) 153 lb (69.4 kg) 158 lb 11.7 oz (72 kg)    Weight change: -1 lb 15.5 oz (-0.893 kg)   Hemodynamic parameters for last 24 hours:    Intake/Output from previous day: 12/21 0701 - 12/22 0700 In: 1495 [I.V.:1445; IV Piggyback:50] Out: [id number] [Urine:1080; Blood:30; Chest Tube:440]  Intake/Output this shift:    Current Meds: Scheduled Meds: . acetaminophen  1,000 mg Oral 4 times per day   Or  . acetaminophen (TYLENOL) oral liquid 160 mg/5 mL  1,000 mg Oral 4 times per day  . antiseptic oral rinse  7 mL Mouth Rinse BID  . digoxin  0.125 mg Oral Daily  . furosemide  40 mg Intravenous BID  . insulin aspart  0-24 Units Subcutaneous 4 times per day  . multivitamin with minerals  1 tablet Oral Daily  . potassium chloride  20 mEq Oral BID  . senna  1 tablet Oral QHS  . verapamil  180 mg Oral QHS   Continuous Infusions: . 0.9 % NaCl with KCl 20 mEq / L 10 mL/hr at 01/17/15 0700   PRN Meds:.ondansetron  (ZOFRAN) IV, oxyCODONE, potassium chloride, traMADol  General appearance: alert, cooperative and no distress Neurologic: intact Heart: regular rate and rhythm, S1, S2 normal, no murmur, click, rub or gallop Lungs: diminished breath sounds bibasilar Abdomen: soft, non-tender; bowel sounds normal; no masses,  no organomegaly Extremities: extremities normal, atraumatic, no cyanosis or edema and Homans sign is negative, no sign of DVT Wound: intact  Lab Results: CBC: Recent Labs  01/15/15 1329 01/17/15 0320  WBC 5.7 8.8  HGB 11.9* 11.8*  HCT 38.0 38.0  PLT 248 240   BMET:  Recent Labs  01/16/15 0454 01/17/15 0320  NA 141 139  K 3.5 3.6  CL 98* 98*  CO2 32 30  GLUCOSE 111* 112*  BUN 12 10  CREATININE 1.15* 1.01*  CALCIUM 9.4 8.8*    PT/INR:  Recent Labs  01/17/15 0320  LABPROT 20.1*  INR 1.71*   Radiology: Dg Chest Port 1 View  01/17/2015  CLINICAL DATA:  Chest tube, pericardial effusion. EXAM: PORTABLE CHEST 1 VIEW COMPARISON:  Chest radiograph from one day prior. FINDINGS: Stable configuration of 3 lead right subclavian pacemaker and av surgical drain terminating over the left lower pericardial space. Stable  cardiomediastinal silhouette with moderate cardiomegaly. No definite pneumothorax. Trace left pleural effusion, stable. Mild pulmonary edema appears slightly decreased. IMPRESSION: 1. Mild congestive heart failure, slightly improved. 2. Stable trace bilateral pleural effusions. Electronically Signed   By: Ilona Sorrel M.D.   On: 01/17/2015 07:44   Dg Chest Port 1 View  01/16/2015  CLINICAL DATA:  Postop thoracic surgery. Chest tube in place. Subxiphoid pericardial window procedure. Evaluate for pneumothorax. EXAM: PORTABLE CHEST 1 VIEW COMPARISON:  01/15/2015 FINDINGS: Evidence for a surgical drain in the left lower chest. Again noted is enlargement of the cardiac silhouette with prominent perihilar densities and peribronchial thickening. Right cardiac pacemaker is  again noted and leads appear to be grossly stable. Negative for a pneumothorax. IMPRESSION: Evidence for a surgical drain in the left lower chest. Negative for pneumothorax. Enlarged central vascular structures with peribronchial thickening. Findings are suggestive for pulmonary edema. Electronically Signed   By: Markus Daft M.D.   On: 01/16/2015 10:35     Assessment/Plan: S/P Procedure(s) (LRB): SUBXYPHOID PERICARDIAL WINDOW (N/A) TRANSESOPHAGEAL ECHOCARDIOGRAM (TEE) (N/A) Mobilize Diuresis d/c tubes/lines D/c foley Resume coumadin  D/c pericardial drain tomorrow Further management per cardiology  Grace Isaac 01/17/2015 8:05 AM

## 2015-01-17 NOTE — Progress Notes (Signed)
SUBJECTIVE:  No complaints  OBJECTIVE:   Vitals:   Filed Vitals:   01/17/15 0900 01/17/15 1000 01/17/15 1100 01/17/15 1200  BP: 118/57 104/73 100/51   Pulse: 58 69 58   Temp:    98.2 F (36.8 C)  TempSrc:    Oral  Resp: 32 21 19   Weight:      SpO2: 92% 94% 90%    I&O's:   Intake/Output Summary (Last 24 hours) at 01/17/15 1238 Last data filed at 01/17/15 1200  Gross per 24 hour  Intake    670 ml  Output   1270 ml  Net   -600 ml   TELEMETRY: Reviewed telemetry pt in NSR:     PHYSICAL EXAM General: Well developed, well nourished, in no acute distress Head: Eyes PERRLA, No xanthomas.   Normal cephalic and atramatic  Lungs:   Clear bilaterally to auscultation and percussion. Heart:   HRRR S1 S2 Pulses are 2+ & equal. Abdomen: Bowel sounds are positive, abdomen soft and non-tender without masses  Extremities:   No clubbing, cyanosis.  DP +1.  1+ edema Neuro: Alert and oriented X 3. Psych:  Good affect, responds appropriately   LABS: Basic Metabolic Panel:  Recent Labs  01/16/15 0454 01/17/15 0320  NA 141 139  K 3.5 3.6  CL 98* 98*  CO2 32 30  GLUCOSE 111* 112*  BUN 12 10  CREATININE 1.15* 1.01*  CALCIUM 9.4 8.8*   Liver Function Tests:  Recent Labs  01/15/15 1329  AST 28  ALT 18  ALKPHOS 69  BILITOT 0.6  PROT 6.3*  ALBUMIN 3.3*   No results for input(s): LIPASE, AMYLASE in the last 72 hours. CBC:  Recent Labs  01/15/15 1329 01/17/15 0320  WBC 5.7 8.8  NEUTROABS 4.1  --   HGB 11.9* 11.8*  HCT 38.0 38.0  MCV 92.2 92.2  PLT 248 240   Cardiac Enzymes: No results for input(s): CKTOTAL, CKMB, CKMBINDEX, TROPONINI in the last 72 hours. BNP: Invalid input(s): POCBNP D-Dimer: No results for input(s): DDIMER in the last 72 hours. Hemoglobin A1C:  Recent Labs  01/15/15 1329  HGBA1C 6.2*   Fasting Lipid Panel: No results for input(s): CHOL, HDL, LDLCALC, TRIG, CHOLHDL, LDLDIRECT in the last 72 hours. Thyroid Function Tests:  Recent  Labs  01/15/15 1329  TSH 1.989   Anemia Panel: No results for input(s): VITAMINB12, FOLATE, FERRITIN, TIBC, IRON, RETICCTPCT in the last 72 hours. Coag Panel:   Lab Results  Component Value Date   INR 1.71* 01/17/2015   INR 2.39* 01/16/2015   INR 3.25* 01/15/2015    RADIOLOGY: Dg Chest 2 View  01/15/2015  CLINICAL DATA:  Preoperative examination. EXAM: CHEST  2 VIEW COMPARISON:  07/17/2014; 01/13/2014; 06/11/2012; 07/01/2011; chest CT - 07/04/2011 FINDINGS: Grossly unchanged marked enlargement of the cardiac silhouette and mediastinal contours with atherosclerotic plaque within thoracic aorta. Stable positioning of support apparatus. There is chronic atelectasis/collapse of the right middle lobe. Chronic pulmonary venous congestion without frank evidence of edema. There is persistent blunting of the bilobed costophrenic angles without definite pleural effusion. No pneumothorax. Unchanged bones. IMPRESSION: 1. Similar findings of marked cardiomegaly and pulmonary venous congestion without definite acute cardiopulmonary disease. 2. Chronic partial atelectasis/collapse of the right middle lobe, similar compared to the 06/2011 examinations. Electronically Signed   By: Sandi Mariscal M.D.   On: 01/15/2015 16:13   Dg Chest Port 1 View  01/17/2015  CLINICAL DATA:  Chest tube, pericardial effusion. EXAM: PORTABLE CHEST  1 VIEW COMPARISON:  Chest radiograph from one day prior. FINDINGS: Stable configuration of 3 lead right subclavian pacemaker and av surgical drain terminating over the left lower pericardial space. Stable cardiomediastinal silhouette with moderate cardiomegaly. No definite pneumothorax. Trace left pleural effusion, stable. Mild pulmonary edema appears slightly decreased. IMPRESSION: 1. Mild congestive heart failure, slightly improved. 2. Stable trace bilateral pleural effusions. Electronically Signed   By: Ilona Sorrel M.D.   On: 01/17/2015 07:44   Dg Chest Port 1 View  01/16/2015   CLINICAL DATA:  Postop thoracic surgery. Chest tube in place. Subxiphoid pericardial window procedure. Evaluate for pneumothorax. EXAM: PORTABLE CHEST 1 VIEW COMPARISON:  01/15/2015 FINDINGS: Evidence for a surgical drain in the left lower chest. Again noted is enlargement of the cardiac silhouette with prominent perihilar densities and peribronchial thickening. Right cardiac pacemaker is again noted and leads appear to be grossly stable. Negative for a pneumothorax. IMPRESSION: Evidence for a surgical drain in the left lower chest. Negative for pneumothorax. Enlarged central vascular structures with peribronchial thickening. Findings are suggestive for pulmonary edema. Electronically Signed   By: Markus Daft M.D.   On: 01/16/2015 10:35    ASSESSMENT AND PLAN: Determined by Dr Sallyanne Kuster  1. Pericardial effusion: Effusion had increased in size between July and November. She got shingles and because of that had not yet had her pericardial window. She is now s/p pericardial drainage of 1100cc of light yellow fluid with pericardial window.  CVTS plans to d/c drain tomorrow.    2. Acute on chronic diastolic CHF: She is - 2L out. Renal function stable.   Continue IV diuresis  3. Chronic anticoagulation with Coumadin: Coumadin had been on hold and anticoagulation reversed with Vit K.  Per CVTS ok to restart coumadin per pharmacy.    Sueanne Margarita, MD  01/17/2015  12:38 PM

## 2015-01-17 NOTE — Progress Notes (Signed)
Utilization Review Completed.  

## 2015-01-17 NOTE — Progress Notes (Deleted)
[facility] DAILY ICU PROGRESS NOTE                   [address]            [place] [address]          [phone]   1 Day Post-Op Procedure(s) (LRB): SUBXYPHOID PERICARDIAL WINDOW (N/A) TRANSESOPHAGEAL ECHOCARDIOGRAM (TEE) (N/A)  Total Length of Stay:  LOS: 2 days   Subjective:  [name] complains of a "pulled muscle" along her right side.  She is asking if she can have a lidocaine patch for this.  Objective: Vital signs in last 24 hours: Temp:  [97.3 F (36.3 C)-98.3 F (36.8 C)] 97.8 F (36.6 C) (12/22 0400) Pulse Rate:  [57-120] 59 (12/22 0700) Cardiac Rhythm:  [-] Ventricular paced (12/22 0700) Resp:  [12-43] 29 (12/22 0700) BP: (98-131)/(48-93) 129/51 mmHg (12/22 0700) SpO2:  [90 %-96 %] 93 % (12/22 0700) Arterial Line BP: (105-145)/(43-68) 122/53 mmHg (12/22 0700) Weight:  [158 lb 11.7 oz (72 kg)] 158 lb 11.7 oz (72 kg) (12/22 0400)  Filed Weights   01/15/15 1049 01/16/15 0503 01/17/15 0400  Weight: 160 lb 11.2 oz (72.893 kg) 153 lb (69.4 kg) 158 lb 11.7 oz (72 kg)    Weight change: -1 lb 15.5 oz (-0.893 kg)   Intake/Output from previous day: 12/21 0701 - 12/22 0700 In: 1495 [I.V.:1445; IV Piggyback:50] Out: [id number] [Urine:1080; Blood:30; Chest Tube:440]  Current Meds: Scheduled Meds: . acetaminophen  1,000 mg Oral 4 times per day   Or  . acetaminophen (TYLENOL) oral liquid 160 mg/5 mL  1,000 mg Oral 4 times per day  . antiseptic oral rinse  7 mL Mouth Rinse BID  . digoxin  0.125 mg Oral Daily  . furosemide  40 mg Intravenous BID  . insulin aspart  0-24 Units Subcutaneous 4 times per day  . multivitamin with minerals  1 tablet Oral Daily  . potassium chloride  20 mEq Oral BID  . senna  1 tablet Oral QHS  . verapamil  180 mg Oral QHS   Continuous Infusions: . 0.9 % NaCl with KCl 20 mEq / L 10 mL/hr at 01/17/15 0700   PRN Meds:.ondansetron (ZOFRAN) IV, oxyCODONE, potassium chloride, traMADol  General appearance: alert, cooperative and no  distress Heart: regular rate and rhythm Lungs: diminished breath sounds bibasilar Abdomen: soft, non-tender; bowel sounds normal; no masses,  no organomegaly Extremities: edema trace Wound: clean and dry  Lab Results: CBC: Recent Labs  01/15/15 1329 01/17/15 0320  WBC 5.7 8.8  HGB 11.9* 11.8*  HCT 38.0 38.0  PLT 248 240   BMET:  Recent Labs  01/16/15 0454 01/17/15 0320  NA 141 139  K 3.5 3.6  CL 98* 98*  CO2 32 30  GLUCOSE 111* 112*  BUN 12 10  CREATININE 1.15* 1.01*  CALCIUM 9.4 8.8*    PT/INR:  Recent Labs  01/17/15 0320  LABPROT 20.1*  INR 1.71*   Radiology: Dg Chest Port 1 View  01/17/2015  CLINICAL DATA:  Chest tube, pericardial effusion. EXAM: PORTABLE CHEST 1 VIEW COMPARISON:  Chest radiograph from one day prior. FINDINGS: Stable configuration of 3 lead right subclavian pacemaker and av surgical drain terminating over the left lower pericardial space. Stable cardiomediastinal silhouette with moderate cardiomegaly. No definite pneumothorax. Trace left pleural effusion, stable. Mild pulmonary edema appears slightly decreased. IMPRESSION: 1. Mild congestive heart failure, slightly improved. 2. Stable trace bilateral pleural effusions. Electronically Signed   By: Corene Cornea  A Poff M.D.   On: 01/17/2015 07:44   Dg Chest Port 1 View  01/16/2015  CLINICAL DATA:  Postop thoracic surgery. Chest tube in place. Subxiphoid pericardial window procedure. Evaluate for pneumothorax. EXAM: PORTABLE CHEST 1 VIEW COMPARISON:  01/15/2015 FINDINGS: Evidence for a surgical drain in the left lower chest. Again noted is enlargement of the cardiac silhouette with prominent perihilar densities and peribronchial thickening. Right cardiac pacemaker is again noted and leads appear to be grossly stable. Negative for a pneumothorax. IMPRESSION: Evidence for a surgical drain in the left lower chest. Negative for pneumothorax. Enlarged central vascular structures with peribronchial thickening. Findings  are suggestive for pulmonary edema. Electronically Signed   By: Markus Daft M.D.   On: 01/16/2015 10:35     Assessment/Plan: S/P Procedure(s) (LRB): SUBXYPHOID PERICARDIAL WINDOW (N/A) TRANSESOPHAGEAL ECHOCARDIOGRAM (TEE) (N/A)  1. CV- H/O Chronic A. Fib- continue Digoxin, Verapamil- appreciate Cardiology consult 2. Pulm- wean oxygen as tolerated, CXR shows improvement of CHF, small bilateral effusions 3. Renal- creatinine WNL, on lasix 4. INR 1.71, no currently on coumadin, will speak with staff in regards to restarting 5. D/c arterial line 6. Dispo- patient stable, CT with 400 cc output leave today, pain control on Ultram will start Norco-patient doesn't like Oxy, continue current care   [name] 01/17/2015 8:07 AM

## 2015-01-17 NOTE — Care Management Note (Signed)
Case Management Note  Patient Details  Name: [name] MRN: [id number] Date of Birth: 18-Oct-1923  Subjective/Objective:  Pt lives alone but has arranged to go home with dtr who will provide 24/7 assistance when she is medically stable for discharge.                    Expected Discharge Plan:  Home/Self Care  Discharge planning Services  CM Consult  Status of Service:  In process, will continue to follow  Girard Cooter, RN 01/17/2015, 10:42 AM

## 2015-01-18 ENCOUNTER — Inpatient Hospital Stay (HOSPITAL_COMMUNITY): Payer: Medicare Other

## 2015-01-18 LAB — GLUCOSE, CAPILLARY
GLUCOSE-CAPILLARY: 94 mg/dL (ref 65–99)
Glucose-Capillary: 111 mg/dL — ABNORMAL HIGH (ref 65–99)

## 2015-01-18 LAB — CBC
HEMATOCRIT: 38.8 % (ref 36.0–46.0)
HEMOGLOBIN: 12.3 g/dL (ref 12.0–15.0)
MCH: 29.3 pg (ref 26.0–34.0)
MCHC: 31.7 g/dL (ref 30.0–36.0)
MCV: 92.4 fL (ref 78.0–100.0)
Platelets: 238 10*3/uL (ref 150–400)
RBC: 4.2 MIL/uL (ref 3.87–5.11)
RDW: 15.1 % (ref 11.5–15.5)
WBC: 8.8 10*3/uL (ref 4.0–10.5)

## 2015-01-18 LAB — COMPREHENSIVE METABOLIC PANEL
ALBUMIN: 3.1 g/dL — AB (ref 3.5–5.0)
ALK PHOS: 79 U/L (ref 38–126)
ALT: 21 U/L (ref 14–54)
ANION GAP: 8 (ref 5–15)
AST: 27 U/L (ref 15–41)
BILIRUBIN TOTAL: 1.6 mg/dL — AB (ref 0.3–1.2)
BUN: 16 mg/dL (ref 6–20)
CALCIUM: 9.3 mg/dL (ref 8.9–10.3)
CO2: 31 mmol/L (ref 22–32)
Chloride: 98 mmol/L — ABNORMAL LOW (ref 101–111)
Creatinine, Ser: 1.19 mg/dL — ABNORMAL HIGH (ref 0.44–1.00)
GFR calc Af Amer: 46 mL/min — ABNORMAL LOW (ref >60)
GFR calc non Af Amer: 39 mL/min — ABNORMAL LOW (ref >60)
GLUCOSE: 121 mg/dL — AB (ref 65–99)
Potassium: 4.4 mmol/L (ref 3.5–5.1)
SODIUM: 137 mmol/L (ref 135–145)
TOTAL PROTEIN: 6.4 g/dL — AB (ref 6.5–8.1)

## 2015-01-18 LAB — PROTIME-INR
INR: 1.62 — AB (ref 0.00–1.49)
PROTHROMBIN TIME: 19.2 s — AB (ref 11.6–15.2)

## 2015-01-18 NOTE — Care Management Important Message (Signed)
Important Message  Patient Details  Name: [name] MRN: [id number] Date of Birth: [birth year]   Medicare Important Message Given:  Yes    Nathen May 01/18/2015, 3:28 PM

## 2015-01-18 NOTE — Op Note (Signed)
[name], [name]             ACCOUNT NO.:  [id number]  MEDICAL RECORD NO.:  [id number]  LOCATION:  [id number]                        FACILITY:  [facility]  PHYSICIAN:  Lanelle Bal, MD    DATE OF BIRTH:  Apr 18, 1923  DATE OF PROCEDURE:  01/16/2015 DATE OF DISCHARGE:                              OPERATIVE REPORT   PREOPERATIVE DIAGNOSIS:  Large pericardial effusion.  POSTOPERATIVE DIAGNOSIS:  Large pericardial effusion.  PROCEDURE:  Subxiphoid pericardial window.  SURGEON:  Lanelle Bal, MD  FIRST ASSISTANT:  Lars Pinks, PA  BRIEF HISTORY:  The patient is an 81 year old female who has been known to have chronic pericardial effusion for 6 months or more, however, approximately a month ago, she had some increasing shortness of breath and an echo showed increasing pericardial effusion.  She was referred for pericardial window by Cardiology and this was arranged to be done; however, the patient then developed shingles and wished to delay having the procedure done.  In the mean time, she was seen in followup in the cardiology office and was continued to have increasing shortness of breath.  A repeat echo showed persistence of a large pericardial effusion, and the patient was admitted.  She was agreeable with proceeding with the previously planned pericardial window both for attempted diagnosis of the fluid and also for relief, though on echo she did not have evidence of tamponade.  The patient agreed and signed informed consent.  DESCRIPTION OF PROCEDURE:  The patient underwent general endotracheal anesthesia without incident.  Skin of chest and upper abdomen was prepped with Betadine and draped in usual sterile manner.  Appropriate time-out was performed and we then proceeded with a subxiphoid pericardial window making a small incision over the subxiphoid process and carried the incision down through the subcutaneous tissue.  The lower edge of the pericardium was  identified and with slight retraction on the xiphoid process, we had good visualization of the pericardium and underlying myocardium.  Pericardium was opened and 1100 mL of straw- colored fluid were easily removed, portion of this was sent for cultures and also for cytology and chemical studies.  The anterior portion of the pericardium was identified, and using electrocautery, a segment of the anterior pericardium was excised and submitted to Pathology.  A 28 Blake drain was left in the inferior recess of the pericardium and brought out through a separate site just to the right of the incision.  The fascial layer closed with interrupted 0 Vicryl, running 2-0 Vicryl in subcutaneous tissue, and 4-0 subcuticular stitch in skin edges. Dermabond was applied.  The patient tolerated the procedure without obvious complication.  TEE showed complete resolution of the pericardial effusion.  The patient tolerated the procedure without obvious complication.  Sponge and needle count was reported as correct at completion of procedure.  She was extubated in the operating room and transferred to the recovery room for further postoperative care.     Lanelle Bal, MD     EG/MEDQ  D:  01/18/2015  T:  01/18/2015  Job:  [id number]

## 2015-01-18 NOTE — Progress Notes (Addendum)
[facility] DAILY ICU PROGRESS NOTE                   [address]            [place] [address]          [phone]   2 Days Post-Op Procedure(s) (LRB): SUBXYPHOID PERICARDIAL WINDOW (N/A) TRANSESOPHAGEAL ECHOCARDIOGRAM (TEE) (N/A)  Total Length of Stay:  LOS: 3 days   Subjective:  States doing okay considering.  She states she is not sleeping well.   No BM  Objective: Vital signs in last 24 hours: Temp:  [97.9 F (36.6 C)-98.7 F (37.1 C)] 98.4 F (36.9 C) (12/23 0400) Pulse Rate:  [58-80] 59 (12/23 0700) Cardiac Rhythm:  [-] Ventricular paced (12/23 0400) Resp:  [14-32] 16 (12/23 0700) BP: (92-141)/(50-127) 104/51 mmHg (12/23 0700) SpO2:  [87 %-97 %] 93 % (12/23 0700) Arterial Line BP: (123)/(52-56) 123/52 mmHg (12/22 0900) Weight:  [158 lb 8.2 oz (71.9 kg)] 158 lb 8.2 oz (71.9 kg) (12/23 0500)  Filed Weights   01/16/15 0503 01/17/15 0400 01/18/15 0500  Weight: 153 lb (69.4 kg) 158 lb 11.7 oz (72 kg) 158 lb 8.2 oz (71.9 kg)    Weight change: -3.5 oz (-0.1 kg)   Intake/Output from previous day: 12/22 0701 - 12/23 0700 In: [id number] [P.O.:900; I.V.:140] Out: 1135 [Urine:965; Chest Tube:170]  Current Meds: Scheduled Meds: . acetaminophen  1,000 mg Oral 4 times per day   Or  . acetaminophen (TYLENOL) oral liquid 160 mg/5 mL  1,000 mg Oral 4 times per day  . antiseptic oral rinse  7 mL Mouth Rinse BID  . digoxin  0.125 mg Oral Daily  . enoxaparin (LOVENOX) injection  30 mg Subcutaneous Q12H  . furosemide  40 mg Intravenous BID  . insulin aspart  0-24 Units Subcutaneous 4 times per day  . lidocaine  1 patch Transdermal Q24H  . multivitamin with minerals  1 tablet Oral Daily  . potassium chloride  20 mEq Oral BID  . senna  1 tablet Oral QHS  . verapamil  180 mg Oral QHS  . Warfarin - Physician Dosing Inpatient   Does not apply q1800   Continuous Infusions: . 0.9 % NaCl with KCl 20 mEq / L Stopped (01/17/15 2100)   PRN Meds:.HYDROcodone-acetaminophen,  ondansetron (ZOFRAN) IV, potassium chloride, traMADol  General appearance: alert, cooperative and no distress Heart: regular rate and rhythm Lungs: diminished breath sounds bibasilar Abdomen: soft, non-tender; bowel sounds normal; no masses,  no organomegaly Extremities: edema 2-3+ pitting Wound: clean and dry  Lab Results: CBC: Recent Labs  01/17/15 0320 01/18/15 0333  WBC 8.8 8.8  HGB 11.8* 12.3  HCT 38.0 38.8  PLT 240 238   BMET:  Recent Labs  01/17/15 0320 01/18/15 0333  NA 139 137  K 3.6 4.4  CL 98* 98*  CO2 30 31  GLUCOSE 112* 121*  BUN 10 16  CREATININE 1.01* 1.19*  CALCIUM 8.8* 9.3    PT/INR:  Recent Labs  01/18/15 0333  LABPROT 19.2*  INR 1.62*   Radiology: No results found.   Assessment/Plan: S/P Procedure(s) (LRB): SUBXYPHOID PERICARDIAL WINDOW (N/A) TRANSESOPHAGEAL ECHOCARDIOGRAM (TEE) (N/A)  1. Chest tube 150 cc output, decreasing- ? Output marginal, possibly chest tube removal today 2. Pulm- CXR with atelectasis, CHF changes 3. INR 1.62, continue coumadin at 2.5 mg for now 4. Dispo- care per Cardiology     Ellwood Handler 01/18/2015 7:44 AM Recent Results (from the past 240  hour(s))  Body fluid culture     Status: None (Preliminary result)   Collection Time: 01/16/15  9:13 AM  Result Value Ref Range Status   Specimen Description FLUID PERICARDIAL  Final   Special Requests SPEC A  Final   Gram Stain   Final    FEW WBC PRESENT,BOTH PMN AND MONONUCLEAR NO ORGANISMS SEEN    Culture NO GROWTH 1 DAY  Final   Report Status PENDING  Incomplete  Fungus Culture with Smear     Status: None (Preliminary result)   Collection Time: 01/16/15  9:18 AM  Result Value Ref Range Status   Specimen Description FLUID PERICARDIAL  Final   Special Requests NONE  Final   Fungal Smear   Final    NO YEAST OR FUNGAL ELEMENTS SEEN Performed at [facility]    Culture   Final    CULTURE IN PROGRESS FOR FOUR WEEKS Performed at [facility]    Report Status PENDING  Incomplete  AFB culture with smear     Status: None (Preliminary result)   Collection Time: 01/16/15  9:18 AM  Result Value Ref Range Status   Specimen Description FLUID PERICARDIAL  Final   Special Requests NONE  Final   Acid Fast Smear   Final    NO ACID FAST BACILLI SEEN Performed at [facility]    Culture   Final    CULTURE WILL BE EXAMINED FOR 6 WEEKS BEFORE ISSUING A FINAL REPORT Performed at [facility]    Report Status PENDING  Incomplete  Culture, body fluid-bottle     Status: None (Preliminary result)   Collection Time: 01/16/15  9:18 AM  Result Value Ref Range Status   Specimen Description FLUID PERICARDIAL  Final   Special Requests NONE  Final   Culture NO GROWTH 1 DAY  Final   Report Status PENDING  Incomplete  Gram stain     Status: None   Collection Time: 01/16/15  9:18 AM  Result Value Ref Range Status   Specimen Description FLUID PERICARDIAL  Final   Special Requests NONE  Final   Gram Stain   Final    MODERATE WBC PRESENT,BOTH PMN AND MONONUCLEAR NO ORGANISMS SEEN    Report Status 01/16/2015 FINAL  Final  Culture, body fluid-bottle     Status: None (Preliminary result)   Collection Time: 01/16/15  9:18 AM  Result Value Ref Range Status   Specimen Description FLUID PERICARDIAL  Final   Special Requests NONE  Final   Culture NO GROWTH 1 DAY  Final   Report Status PENDING  Incomplete  Gram stain     Status: None   Collection Time: 01/16/15  9:18 AM  Result Value Ref Range Status   Specimen Description FLUID PERICARDIAL  Final   Special Requests NONE  Final   Gram Stain   Final    FEW WBC PRESENT,BOTH PMN AND MONONUCLEAR NO ORGANISMS SEEN    Report Status 01/16/2015 FINAL  Final  MRSA PCR Screening     Status: None   Collection Time: 01/17/15  3:17 AM  Result Value Ref Range Status   MRSA by PCR NEGATIVE NEGATIVE Final    Comment:        The GeneXpert MRSA Assay (FDA approved for NASAL  specimens only), is one component of a comprehensive MRSA colonization surveillance program. It is not intended to diagnose MRSA infection nor to guide or monitor treatment for MRSA infections.  Path Review    Comments: REACTIVE MESOTHELIAL CELLS AND  MACROPHAGES PRESENT, NO ATYPIA SEEN.  Reviewed by Lennox Solders Lyndon Code, M.D.  01/17/15          FINAL DIAGNOSIS Diagnosis Pericardium, biopsy - BENIGN PERICARDIAL TISSUE, SEE COMMENT. Microscopic Comment Tissue sections demonstrate benign pericardium and associated fibrofatty soft tissue. Although the pericardial epithelium is largely denuded, where present, the epithelium demonstrates significant reactive epithelial atypia. Definitive features of malignancy are not present. Calretinin, CK 5/6, TTF-1, WT-1, CEA, and MOC-31 immunostains were used in the diagnostic workup of the case. (CRR:ecj 01/18/2015) Mali RUND DO Pathologist, Electronic Signature (Case signed 01/18/2015) Specimen Gross and Clinical Information Specimen(s) Obtained: Pericardium, biopsy Specimen Clinical  Past 12 hours only 20 ml from tube , will d/c  Ok from surgical view to transfer to monitored bed, poss home in am  Back on coumadin I have seen and examined [name] and agree with the above assessment  and plan.  Grace Isaac MD Beeper [phone] Office [phone] 01/18/2015 8:13 AM

## 2015-01-18 NOTE — Op Note (Deleted)
NAMEKIVA, CHUGG             ACCOUNT NO.:  [id number]  MEDICAL RECORD NO.:  [id number]  LOCATION:  [place]                       FACILITY:  [facility]  PHYSICIAN:  Lanelle Bal, MD    DATE OF BIRTH:  1923-05-23  DATE OF PROCEDURE:  01/16/2015 DATE OF DISCHARGE:  01/16/2015                              OPERATIVE REPORT   PREOPERATIVE DIAGNOSIS:  Large pericardial effusion.  POSTOPERATIVE DIAGNOSIS:  Large pericardial effusion.  PROCEDURE:  Subxiphoid pericardial window.  SURGEON:  Lanelle Bal, MD  FIRST ASSISTANT:  Lars Pinks, PA  BRIEF HISTORY:  The patient is an 81 year old female who has been known to have chronic pericardial effusion for 6 months or more, however, approximately a month ago, she had some increasing shortness of breath and an echo showed increasing pericardial effusion.  She was referred for pericardial window by Cardiology and this was arranged to be done; however, the patient then developed shingles and wished to delay having the procedure done.  In the mean time, she was seen in followup in the cardiology office and was continued to have increasing shortness of breath.  A repeat echo showed persistence of a large pericardial effusion, and the patient was admitted.  She was agreeable with proceeding with the previously planned pericardial window both for attempted diagnosis of the fluid and also for relief, though on echo she did not have evidence of tamponade.  The patient agreed and signed informed consent.  DESCRIPTION OF PROCEDURE:  The patient underwent general endotracheal anesthesia without incident.  Skin of chest and upper abdomen was prepped with Betadine and draped in usual sterile manner.  Appropriate time-out was performed and we then proceeded with a subxiphoid pericardial window making a small incision over the subxiphoid process and carried the incision down through the subcutaneous tissue.  The lower edge of the pericardium  was identified and with slight retraction on the xiphoid process, we had good visualization of the pericardium and underlying myocardium.  Pericardium was opened and 1100 mL of straw- colored fluid were easily removed, portion of this was sent for cultures and also for cytology and chemical studies.  The anterior portion of the pericardium was identified, and using electrocautery, a segment of the anterior pericardium was excised and submitted to Pathology.  A 28 Blake drain was left in the inferior recess of the pericardium and brought out through a separate site just to the right of the incision.  The fascial layer closed with interrupted 0 Vicryl, running 2-0 Vicryl in subcutaneous tissue, and 4-0 subcuticular stitch in skin edges. Dermabond was applied.  The patient tolerated the procedure without obvious complication.  TEE showed complete resolution of the pericardial effusion.  The patient tolerated the procedure without obvious complication.  Sponge and needle count was reported as correct at completion of procedure.  She was extubated in the operating room and transferred to the recovery room for further postoperative care.     Lanelle Bal, MD     EG/MEDQ  D:  01/18/2015  T:  01/18/2015  Job:  [id number]

## 2015-01-18 NOTE — Progress Notes (Signed)
SUBJECTIVE:  No complaints.  Excited to get pericardial drain out  OBJECTIVE:   Vitals:   Filed Vitals:   01/18/15 0500 01/18/15 0600 01/18/15 0700 01/18/15 0800  BP: 106/51 126/64 104/51 120/54  Pulse: 59 77 59 58  Temp:   98.1 F (36.7 C)   TempSrc:   Oral   Resp: 18 25 16 21   Weight: 158 lb 8.2 oz (71.9 kg)     SpO2: 91% 91% 93% 96%   I&O's:   Intake/Output Summary (Last 24 hours) at 01/18/15 0955 Last data filed at 01/18/15 0900  Gross per 24 hour  Intake    780 ml  Output   1035 ml  Net   -255 ml   TELEMETRY: Reviewed telemetry pt in atrial fibrillation with V pacing     PHYSICAL EXAM General: Well developed, well nourished, in no acute distress Head: Eyes PERRLA, No xanthomas.   Normal cephalic and atramatic  Lungs:   Clear bilaterally to auscultation and percussion. Heart:   HRRR S1 S2 Pulses are 2+ & equal. Abdomen: Bowel sounds are positive, abdomen soft and non-tender without masses Extremities:   No clubbing, cyanosis or edema.  DP +1 Neuro: Alert and oriented X 3. Psych:  Good affect, responds appropriately   LABS: Basic Metabolic Panel:  Recent Labs  01/17/15 0320 01/18/15 0333  NA 139 137  K 3.6 4.4  CL 98* 98*  CO2 30 31  GLUCOSE 112* 121*  BUN 10 16  CREATININE 1.01* 1.19*  CALCIUM 8.8* 9.3   Liver Function Tests:  Recent Labs  01/15/15 1329 01/18/15 0333  AST 28 27  ALT 18 21  ALKPHOS 69 79  BILITOT 0.6 1.6*  PROT 6.3* 6.4*  ALBUMIN 3.3* 3.1*   No results for input(s): LIPASE, AMYLASE in the last 72 hours. CBC:  Recent Labs  01/15/15 1329 01/17/15 0320 01/18/15 0333  WBC 5.7 8.8 8.8  NEUTROABS 4.1  --   --   HGB 11.9* 11.8* 12.3  HCT 38.0 38.0 38.8  MCV 92.2 92.2 92.4  PLT 248 240 238   Cardiac Enzymes: No results for input(s): CKTOTAL, CKMB, CKMBINDEX, TROPONINI in the last 72 hours. BNP: Invalid input(s): POCBNP D-Dimer: No results for input(s): DDIMER in the last 72 hours. Hemoglobin A1C:  Recent Labs  01/15/15 1329  HGBA1C 6.2*   Fasting Lipid Panel: No results for input(s): CHOL, HDL, LDLCALC, TRIG, CHOLHDL, LDLDIRECT in the last 72 hours. Thyroid Function Tests:  Recent Labs  01/15/15 1329  TSH 1.989   Anemia Panel: No results for input(s): VITAMINB12, FOLATE, FERRITIN, TIBC, IRON, RETICCTPCT in the last 72 hours. Coag Panel:   Lab Results  Component Value Date   INR 1.62* 01/18/2015   INR 1.71* 01/17/2015   INR 2.39* 01/16/2015    RADIOLOGY: Dg Chest 2 View  01/15/2015  CLINICAL DATA:  Preoperative examination. EXAM: CHEST  2 VIEW COMPARISON:  07/17/2014; 01/13/2014; 06/11/2012; 07/01/2011; chest CT - 07/04/2011 FINDINGS: Grossly unchanged marked enlargement of the cardiac silhouette and mediastinal contours with atherosclerotic plaque within thoracic aorta. Stable positioning of support apparatus. There is chronic atelectasis/collapse of the right middle lobe. Chronic pulmonary venous congestion without frank evidence of edema. There is persistent blunting of the bilobed costophrenic angles without definite pleural effusion. No pneumothorax. Unchanged bones. IMPRESSION: 1. Similar findings of marked cardiomegaly and pulmonary venous congestion without definite acute cardiopulmonary disease. 2. Chronic partial atelectasis/collapse of the right middle lobe, similar compared to the 06/2011 examinations. Electronically Signed  By: Sandi Mariscal M.D.   On: 01/15/2015 16:13   Dg Chest Port 1 View  01/18/2015  CLINICAL DATA:  Chest tube placement EXAM: PORTABLE CHEST 1 VIEW COMPARISON:  12/22/6 FINDINGS: There is a right chest wall pacer device with lead in the right atrial appendage and right ventricle. The heart size is enlarged. There is aortic atherosclerosis. Bilateral pleural effusions and interstitial edema is identified compatible with CHF. Chest tube overlying the left lower pericardial space noted. IMPRESSION: 1. Stable CHF. 2. Aortic atherosclerosis. Electronically Signed   By:  Kerby Moors M.D.   On: 01/18/2015 08:05   Dg Chest Port 1 View  01/17/2015  CLINICAL DATA:  Chest tube, pericardial effusion. EXAM: PORTABLE CHEST 1 VIEW COMPARISON:  Chest radiograph from one day prior. FINDINGS: Stable configuration of 3 lead right subclavian pacemaker and av surgical drain terminating over the left lower pericardial space. Stable cardiomediastinal silhouette with moderate cardiomegaly. No definite pneumothorax. Trace left pleural effusion, stable. Mild pulmonary edema appears slightly decreased. IMPRESSION: 1. Mild congestive heart failure, slightly improved. 2. Stable trace bilateral pleural effusions. Electronically Signed   By: Ilona Sorrel M.D.   On: 01/17/2015 07:44   Dg Chest Port 1 View  01/16/2015  CLINICAL DATA:  Postop thoracic surgery. Chest tube in place. Subxiphoid pericardial window procedure. Evaluate for pneumothorax. EXAM: PORTABLE CHEST 1 VIEW COMPARISON:  01/15/2015 FINDINGS: Evidence for a surgical drain in the left lower chest. Again noted is enlargement of the cardiac silhouette with prominent perihilar densities and peribronchial thickening. Right cardiac pacemaker is again noted and leads appear to be grossly stable. Negative for a pneumothorax. IMPRESSION: Evidence for a surgical drain in the left lower chest. Negative for pneumothorax. Enlarged central vascular structures with peribronchial thickening. Findings are suggestive for pulmonary edema. Electronically Signed   By: Markus Daft M.D.   On: 01/16/2015 10:35     ASSESSMENT AND PLAN: Determined by Dr Sallyanne Kuster  1. Pericardial effusion: Effusion had increased in size between July and November. She got shingles and because of that had not yet had her pericardial window. She is now s/p pericardial drainage of 1100cc of light yellow fluid with pericardial window. CVTS plans to d/c drain today.  2. Acute on chronic diastolic CHF: She is - [id number] out. Renal function stable. Continue IV diuresis as she  still appears volume overloaded with LE edema.  3. Chronic anticoagulation with Coumadin: Coumadin had been on hold and anticoagulation reversed with Vit K. Now back on coumadin per pharmacy. INR subtherapeutic.   4.  Chronic atrial fibrillation rate controlled on digoxin and Verapamil.  5.  Tachybrady syndrome s/p PPM 12/2013  Per CVTS patient stable to transfer to tele bed today wit possible d/c home in am.  Sueanne Margarita, MD  01/18/2015  9:55 AM

## 2015-01-19 ENCOUNTER — Inpatient Hospital Stay (HOSPITAL_COMMUNITY): Payer: Medicare Other

## 2015-01-19 DIAGNOSIS — Z8673 Personal history of transient ischemic attack (TIA), and cerebral infarction without residual deficits: Secondary | ICD-10-CM

## 2015-01-19 DIAGNOSIS — I482 Chronic atrial fibrillation: Secondary | ICD-10-CM

## 2015-01-19 LAB — BASIC METABOLIC PANEL
ANION GAP: 11 (ref 5–15)
BUN: 24 mg/dL — ABNORMAL HIGH (ref 6–20)
CALCIUM: 9.1 mg/dL (ref 8.9–10.3)
CHLORIDE: 98 mmol/L — AB (ref 101–111)
CO2: 26 mmol/L (ref 22–32)
Creatinine, Ser: 1.19 mg/dL — ABNORMAL HIGH (ref 0.44–1.00)
GFR calc non Af Amer: 39 mL/min — ABNORMAL LOW (ref >60)
GFR, EST AFRICAN AMERICAN: 46 mL/min — AB (ref >60)
Glucose, Bld: 186 mg/dL — ABNORMAL HIGH (ref 65–99)
Potassium: 4.4 mmol/L (ref 3.5–5.1)
Sodium: 135 mmol/L (ref 135–145)

## 2015-01-19 LAB — GLUCOSE, CAPILLARY
Glucose-Capillary: 87 mg/dL (ref 65–99)
Glucose-Capillary: 107 mg/dL — ABNORMAL HIGH (ref 65–99)

## 2015-01-19 LAB — CBC
HCT: 39.4 % (ref 36.0–46.0)
Hemoglobin: 12.7 g/dL (ref 12.0–15.0)
MCH: 29.6 pg (ref 26.0–34.0)
MCHC: 32.2 g/dL (ref 30.0–36.0)
MCV: 91.8 fL (ref 78.0–100.0)
PLATELETS: 270 10*3/uL (ref 150–400)
RBC: 4.29 MIL/uL (ref 3.87–5.11)
RDW: 15 % (ref 11.5–15.5)
WBC: 10.1 10*3/uL (ref 4.0–10.5)

## 2015-01-19 LAB — BODY FLUID CULTURE: Culture: NO GROWTH

## 2015-01-19 LAB — PROTIME-INR
INR: 1.69 — ABNORMAL HIGH (ref 0.00–1.49)
PROTHROMBIN TIME: 19.8 s — AB (ref 11.6–15.2)

## 2015-01-19 MED ORDER — WARFARIN SODIUM 5 MG PO TABS
5.0000 mg | ORAL_TABLET | Freq: Once | ORAL | Status: AC
  Administered 2015-01-19: 5 mg via ORAL
  Filled 2015-01-19: qty 1

## 2015-01-19 MED ORDER — ALBUTEROL SULFATE (2.5 MG/3ML) 0.083% IN NEBU
2.5000 mg | INHALATION_SOLUTION | Freq: Once | RESPIRATORY_TRACT | Status: AC
  Administered 2015-01-19: 2.5 mg via RESPIRATORY_TRACT
  Filled 2015-01-19: qty 3

## 2015-01-19 MED ORDER — WARFARIN - PHARMACIST DOSING INPATIENT
Freq: Every day | Status: DC
  Administered 2015-01-20: 18:00:00

## 2015-01-19 MED ORDER — ALBUTEROL SULFATE (2.5 MG/3ML) 0.083% IN NEBU
2.5000 mg | INHALATION_SOLUTION | RESPIRATORY_TRACT | Status: DC | PRN
  Administered 2015-01-19 – 2015-01-21 (×5): 2.5 mg via RESPIRATORY_TRACT
  Filled 2015-01-19 (×5): qty 3

## 2015-01-19 MED ORDER — FUROSEMIDE 10 MG/ML IJ SOLN
40.0000 mg | Freq: Every day | INTRAMUSCULAR | Status: DC
  Administered 2015-01-19: 40 mg via INTRAVENOUS
  Filled 2015-01-19: qty 4

## 2015-01-19 MED ORDER — WARFARIN SODIUM 5 MG PO TABS: 5.0000 mg | ORAL_TABLET | Freq: Once | ORAL | Status: DC

## 2015-01-19 MED ORDER — FUROSEMIDE 40 MG PO TABS: 40.0000 mg | ORAL_TABLET | Freq: Two times a day (BID) | ORAL | Status: DC

## 2015-01-19 NOTE — Progress Notes (Signed)
ANTICOAGULATION CONSULT NOTE - Initial Consult  Pharmacy Consult for warfarin Indication: atrial fibrillation  Allergies  Allergen Reactions  . Iodinated Diagnostic Agents Hives    HIVES 15MIN S/P IV CONTRAST INJECTION,WILL NEED 13 HR PREP FOR FUTURE INJECTIONS, ok s/p 50mg  po benadryl//a.calhoun  . Anti-Inflammatory Enzyme [Nutritional Supplements]     Retains fluids and headaches  . Arthrotec [Diclofenac-Misoprostol] Other (See Comments)    unknown  . Biaxin [Clarithromycin] Other (See Comments)    unknown  . Plavix [Clopidogrel Bisulfate] Other (See Comments)    unknown  . Spironolactone     Hair loss    Patient Measurements: Weight: 159 lb 9.6 oz (72.394 kg)  Vital Signs: Temp: 98.3 F (36.8 C) (12/24 0500) Temp Source: Oral (12/24 0500) BP: 113/46 mmHg (12/24 0500) Pulse Rate: 59 (12/24 0500)  Labs:  Recent Labs  01/17/15 0320 01/18/15 0333 01/19/15 0258  HGB 11.8* 12.3 12.7  HCT 38.0 38.8 39.4  PLT 240 238 270  LABPROT 20.1* 19.2* 19.8*  INR 1.71* 1.62* 1.69*  CREATININE 1.01* 1.19*  --     Estimated Creatinine Clearance: 32 mL/min (by C-G formula based on Cr of 1.19).   Medical History: Past Medical History  Diagnosis Date  . Pericardial effusion     Chronic. Last echo in April of 2012.   . Atrial fibrillation (HCC)     Chronic  . Chronic anticoagulation     on Coumadin  . History of GI bleed     once treated with Fe infusion  . HTN (hypertension)   . Pacemaker     Medtronic, placed for tachybrady.   . Valvular heart disease   . Obesity   . LVH (left ventricular hypertrophy)   . Thrombophlebitis 1967    associated with pregnancy  . Diverticulosis   . Heart murmur   . Osteoporosis   . Hx of echocardiogram 07/28/2011    EF>55% small to moderate and is mostly located posteriorly. there is no overt evidence of tamponade although subtle diastolic collapse of the right ventricle.  . Sleep apnea     mild-no cpap  . History of blood  transfusion "several times"  . CHF (congestive heart failure) ([place])   . Pneumonia ~ 1986  . Positive TB test   . Anemia   . Iron deficiency anemia     "I've had an iron transfusion"  . Stroke [facility]) April 2013    right frontal  . Arthritis   . Tachycardia-bradycardia syndrome [facility])     s/p Medtronic Sensia  model number [id number], serial number [id number] H 12/2013  . Acute on chronic diastolic CHF (congestive heart failure), NYHA class 3 ([place]) 01/15/2015  . History of shingles     Assessment:  81 y/o F with SOB, pericardial effusion. Pharmacy consulted to dose warfarin from PTA.  Warfarin on hold but resumed 12/22. Missed warfarin dose on 12/23 - INR 1.71>1.62>1.69, hgb 12.7, plts 270, received vit K PO 2mg  on 12/20 - s/p pericardial window 12/21 - also on lovenox prophylaxis, no bleeding noted  PTA warfarin dose 2.5 mg daily except 5 mg on MWF  Goal of Therapy:  INR 2-3 Monitor platelets by anticoagulation protocol: Yes   Plan:  -Warfarin 5 mg po x 1 tonight -Will likely need another dose of 5 mg tomorrow before resuming home dose of warfarin - Enoxaparin 30 SQ daily  - Daily INR, CBC - Monitor for S&S of bleed  Angela Burke, [name] Pharmacy Resident Pager: [phone] 01/19/2015,9:49 AM

## 2015-01-19 NOTE — Progress Notes (Signed)
       [address]       [place] [address]             [phone]          3 Days Post-Op Procedure(s) (LRB): SUBXYPHOID PERICARDIAL WINDOW (N/A) TRANSESOPHAGEAL ECHOCARDIOGRAM (TEE) (N/A)  Subjective: Rested poorly. C/o cough with thick mucus.   Objective: Vital signs in last 24 hours: Patient Vitals for the past 24 hrs:  BP Temp Temp src Pulse Resp SpO2 Weight  01/19/15 0500 (!) 113/46 mmHg 98.3 F (36.8 C) Oral (!) 59 18 92 % 159 lb 9.6 oz (72.394 kg)  01/18/15 1951 (!) 103/55 mmHg 99 F (37.2 C) Oral 70 (!) 22 98 % -  01/18/15 1318 (!) 106/53 mmHg 98.2 F (36.8 C) Oral 63 (!) 24 92 % -  01/18/15 1200 (!) 123/59 mmHg 98.4 F (36.9 C) Oral 60 (!) 25 95 % -  01/18/15 1100 129/61 mmHg - - 65 17 95 % -  01/18/15 1031 - - - 63 - - -  01/18/15 1000 119/65 mmHg - - 61 17 94 % -  01/18/15 0900 - - - 68 (!) 25 - -  01/18/15 0800 (!) 120/54 mmHg - - (!) 58 (!) 21 96 % -   Current Weight  01/19/15 159 lb 9.6 oz (72.394 kg)     Intake/Output from previous day: 12/23 0701 - 12/24 0700 In: 360 [P.O.:360] Out: 20 [Chest Tube:20]    PHYSICAL EXAM:  Heart: RRR Lungs: Few exp wheezes bilaterally Wound: Clean and dry   Lab Results: CBC: Recent Labs  01/18/15 0333 01/19/15 0258  WBC 8.8 10.1  HGB 12.3 12.7  HCT 38.8 39.4  PLT 238 270   BMET:  Recent Labs  01/17/15 0320 01/18/15 0333  NA 139 137  K 3.6 4.4  CL 98* 98*  CO2 30 31  GLUCOSE 112* 121*  BUN 10 16  CREATININE 1.01* 1.19*  CALCIUM 8.8* 9.3    PT/INR:  Recent Labs  01/19/15 0258  LABPROT 19.8*  INR 1.69*      Assessment/Plan: S/P Procedure(s) (LRB): SUBXYPHOID PERICARDIAL WINDOW (N/A) TRANSESOPHAGEAL ECHOCARDIOGRAM (TEE) (N/A) Pt stable from surgical standpoint.   Continue current care per cardiology. Ok to d/c when ok with cardiology- will arrange OP followup.   LOS: 4 days    [name] H 01/19/2015

## 2015-01-19 NOTE — Progress Notes (Signed)
Patient Name: [name] Date of Encounter: 01/19/2015   SUBJECTIVE  Feeling well. No chest pain, sob or palpitations.   CURRENT MEDS . acetaminophen  1,000 mg Oral 4 times per day   Or  . acetaminophen (TYLENOL) oral liquid 160 mg/5 mL  1,000 mg Oral 4 times per day  . antiseptic oral rinse  7 mL Mouth Rinse BID  . digoxin  0.125 mg Oral Daily  . enoxaparin (LOVENOX) injection  30 mg Subcutaneous Q12H  . furosemide  40 mg Intravenous BID  . insulin aspart  0-24 Units Subcutaneous 4 times per day  . lidocaine  1 patch Transdermal Q24H  . multivitamin with minerals  1 tablet Oral Daily  . potassium chloride  20 mEq Oral BID  . senna  1 tablet Oral QHS  . verapamil  180 mg Oral QHS  . Warfarin - Physician Dosing Inpatient   Does not apply q1800    OBJECTIVE  Filed Vitals:   01/18/15 1200 01/18/15 1318 01/18/15 1951 01/19/15 0500  BP: 123/59 106/53 103/55 113/46  Pulse: 60 63 70 59  Temp: 98.4 F (36.9 C) 98.2 F (36.8 C) 99 F (37.2 C) 98.3 F (36.8 C)  TempSrc: Oral Oral Oral Oral  Resp: 25 24 22 18   Weight:    159 lb 9.6 oz (72.394 kg)  SpO2: 95% 92% 98% 92%    Intake/Output Summary (Last 24 hours) at 01/19/15 [id number] Last data filed at 01/18/15 1800  Gross per 24 hour  Intake    360 ml  Output     20 ml  Net    340 ml   Filed Weights   01/17/15 0400 01/18/15 0500 01/19/15 0500  Weight: 158 lb 11.7 oz (72 kg) 158 lb 8.2 oz (71.9 kg) 159 lb 9.6 oz (72.394 kg)    PHYSICAL EXAM  General: Pleasant, NAD. Neuro: Alert and oriented X 3. Moves all extremities spontaneously. Psych: Normal affect. HEENT:  Normal  Neck: Supple without bruits or JVD. Lungs:  Resp regular and unlabored.Expiratory wheezing thorough out. No rales,  Heart: RRR no s3, s4, or murmurs. Abdomen: Soft, non-tender, non-distended, BS + x 4.  Extremities: No clubbing, cyanosis or edema. DP + and equal bilaterally.  Accessory Clinical Findings  CBC  Recent Labs  01/18/15 0333  01/19/15 0258  WBC 8.8 10.1  HGB 12.3 12.7  HCT 38.8 39.4  MCV 92.4 91.8  PLT 238 [id number]   Basic Metabolic Panel  Recent Labs  01/17/15 0320 01/18/15 0333  NA 139 137  K 3.6 4.4  CL 98* 98*  CO2 30 31  GLUCOSE 112* 121*  BUN 10 16  CREATININE 1.01* 1.19*  CALCIUM 8.8* 9.3   Liver Function Tests  Recent Labs  01/18/15 0333  AST 27  ALT 21  ALKPHOS 79  BILITOT 1.6*  PROT 6.4*  ALBUMIN 3.1*    TELE  V paced  Radiology/Studies  Dg Chest 2 View  01/15/2015  CLINICAL DATA:  Preoperative examination. EXAM: CHEST  2 VIEW COMPARISON:  07/17/2014; 01/13/2014; 06/11/2012; 07/01/2011; chest CT - 07/04/2011 FINDINGS: Grossly unchanged marked enlargement of the cardiac silhouette and mediastinal contours with atherosclerotic plaque within thoracic aorta. Stable positioning of support apparatus. There is chronic atelectasis/collapse of the right middle lobe. Chronic pulmonary venous congestion without frank evidence of edema. There is persistent blunting of the bilobed costophrenic angles without definite pleural effusion. No pneumothorax. Unchanged bones. IMPRESSION: 1. Similar findings of marked cardiomegaly and pulmonary venous congestion without  definite acute cardiopulmonary disease. 2. Chronic partial atelectasis/collapse of the right middle lobe, similar compared to the 06/2011 examinations. Electronically Signed   By: Sandi Mariscal M.D.   On: 01/15/2015 16:13   Dg Chest Port 1 View  01/18/2015  CLINICAL DATA:  Chest tube placement EXAM: PORTABLE CHEST 1 VIEW COMPARISON:  12/22/6 FINDINGS: There is a right chest wall pacer device with lead in the right atrial appendage and right ventricle. The heart size is enlarged. There is aortic atherosclerosis. Bilateral pleural effusions and interstitial edema is identified compatible with CHF. Chest tube overlying the left lower pericardial space noted. IMPRESSION: 1. Stable CHF. 2. Aortic atherosclerosis. Electronically Signed   By: Kerby Moors M.D.   On: 01/18/2015 08:05   Dg Chest Port 1 View  01/17/2015  CLINICAL DATA:  Chest tube, pericardial effusion. EXAM: PORTABLE CHEST 1 VIEW COMPARISON:  Chest radiograph from one day prior. FINDINGS: Stable configuration of 3 lead right subclavian pacemaker and av surgical drain terminating over the left lower pericardial space. Stable cardiomediastinal silhouette with moderate cardiomegaly. No definite pneumothorax. Trace left pleural effusion, stable. Mild pulmonary edema appears slightly decreased. IMPRESSION: 1. Mild congestive heart failure, slightly improved. 2. Stable trace bilateral pleural effusions. Electronically Signed   By: Ilona Sorrel M.D.   On: 01/17/2015 07:44   Dg Chest Port 1 View  01/16/2015  CLINICAL DATA:  Postop thoracic surgery. Chest tube in place. Subxiphoid pericardial window procedure. Evaluate for pneumothorax. EXAM: PORTABLE CHEST 1 VIEW COMPARISON:  01/15/2015 FINDINGS: Evidence for a surgical drain in the left lower chest. Again noted is enlargement of the cardiac silhouette with prominent perihilar densities and peribronchial thickening. Right cardiac pacemaker is again noted and leads appear to be grossly stable. Negative for a pneumothorax. IMPRESSION: Evidence for a surgical drain in the left lower chest. Negative for pneumothorax. Enlarged central vascular structures with peribronchial thickening. Findings are suggestive for pulmonary edema. Electronically Signed   By: Markus Daft M.D.   On: 01/16/2015 10:35    ASSESSMENT AND PLAN   1. Pericardial effusion: Effusion had increased in size between July and November. She got shingles and because of that had not yet had her pericardial window. She is now s/p pericardial drainage of 1100cc of light yellow fluid with pericardial window. D/c drain yesterday/   2. Acute on chronic diastolic CHF: diuresed 0000000.  Pending renal function today. appears euvolemic. Will change IV lasix to po lasix 40mg  BID.   3.  Chronic anticoagulation with Coumadin: Coumadin had been on hold and anticoagulation reversed with Vit K. Now back on coumadin per pharmacy. INR subtherapeutic.   4. Chronic atrial fibrillation rate controlled on digoxin and Verapamil. V paced   5. Tachybrady syndrome s/p PPM 12/2013  Dispo: MD to see. Likely discharge today or tomorrow.   Signed, Bhagat,Bhavinkumar PA-C Pager [phone]  I have seen and examined the patient along with Bhagat,Bhavinkumar PA-C.  I have reviewed the chart, notes and new data.  I agree with PA's note.  Key new complaints: coughing and wheezing. On O2 4 L. Key examination changes: bilateral wheezing, healthy chest tube site. JVP elevated Key new findings / data: nondiagnostic pathology and microbiology on pericardial tissue and fluid; small bilateral pleural effusions on CXR. INR 1.7 (s/p vit K). Creat 1.19  PLAN: Bronchodilators. Sit up, ambulate. One more dose of IV furosemide. Try to wean O2. Possibly DC this afternoon, but not ready yet.  Sanda Klein, MD, [place] [phone] 01/19/2015, 7:55 AM

## 2015-01-19 NOTE — Progress Notes (Signed)
Substantially improved after bronchodilators. No further wheezing now. O2 sat 95% after walking, off oxygen. Still feels a little congested. Probably ready for DC by tomorrow AM.

## 2015-01-20 DIAGNOSIS — J209 Acute bronchitis, unspecified: Secondary | ICD-10-CM

## 2015-01-20 LAB — PROTIME-INR
INR: 1.6 — AB (ref 0.00–1.49)
PROTHROMBIN TIME: 19.1 s — AB (ref 11.6–15.2)

## 2015-01-20 MED ORDER — FUROSEMIDE 10 MG/ML IJ SOLN
40.0000 mg | Freq: Two times a day (BID) | INTRAMUSCULAR | Status: DC
  Administered 2015-01-20: 40 mg via INTRAVENOUS
  Filled 2015-01-20: qty 4

## 2015-01-20 MED ORDER — TORSEMIDE 20 MG PO TABS
60.0000 mg | ORAL_TABLET | Freq: Every day | ORAL | Status: DC
  Administered 2015-01-20 – 2015-01-22 (×3): 60 mg via ORAL
  Filled 2015-01-20 (×3): qty 3

## 2015-01-20 MED ORDER — WARFARIN SODIUM 5 MG PO TABS
5.0000 mg | ORAL_TABLET | Freq: Once | ORAL | Status: AC
  Administered 2015-01-20: 5 mg via ORAL
  Filled 2015-01-20: qty 1

## 2015-01-20 MED ORDER — HEPARIN (PORCINE) IN NACL 100-0.45 UNIT/ML-% IJ SOLN
1000.0000 [IU]/h | INTRAMUSCULAR | Status: DC
  Administered 2015-01-20 – 2015-01-21 (×2): 1000 [IU]/h via INTRAVENOUS
  Filled 2015-01-20 (×2): qty 250

## 2015-01-20 MED ORDER — HEPARIN BOLUS VIA INFUSION
3000.0000 [IU] | Freq: Once | INTRAVENOUS | Status: AC
  Administered 2015-01-20: 3000 [IU] via INTRAVENOUS
  Filled 2015-01-20: qty 3000

## 2015-01-20 MED ORDER — DM-GUAIFENESIN ER 30-600 MG PO TB12
1.0000 | ORAL_TABLET | Freq: Two times a day (BID) | ORAL | Status: DC
  Administered 2015-01-20 – 2015-01-26 (×13): 1 via ORAL
  Filled 2015-01-20 (×13): qty 1

## 2015-01-20 NOTE — Progress Notes (Addendum)
ANTICOAGULATION CONSULT NOTE - Initial Consult  Pharmacy Consult for heparin Indication: atrial fibrillation  Allergies  Allergen Reactions  . Iodinated Diagnostic Agents Hives    HIVES 15MIN S/P IV CONTRAST INJECTION,WILL NEED 13 HR PREP FOR FUTURE INJECTIONS, ok s/p 50mg  po benadryl//a.calhoun  . Anti-Inflammatory Enzyme [Nutritional Supplements]     Retains fluids and headaches  . Arthrotec [Diclofenac-Misoprostol] Other (See Comments)    unknown  . Biaxin [Clarithromycin] Other (See Comments)    unknown  . Plavix [Clopidogrel Bisulfate] Other (See Comments)    unknown  . Spironolactone     Hair loss    Patient Measurements: Weight: 160 lb 7.9 oz (72.8 kg) Heparin Dosing Weight: 72 kg  Vital Signs: Temp: 98.1 F (36.7 C) (12/25 0527) Temp Source: Oral (12/25 0527) BP: 134/50 mmHg (12/25 0527) Pulse Rate: 64 (12/25 0527)  Labs:  Recent Labs  01/18/15 0333 01/19/15 0258 01/19/15 0949 01/20/15 0225  HGB 12.3 12.7  --   --   HCT 38.8 39.4  --   --   PLT 238 270  --   --   LABPROT 19.2* 19.8*  --  19.1*  INR 1.62* 1.69*  --  1.60*  CREATININE 1.19*  --  1.19*  --     Estimated Creatinine Clearance: 32 mL/min (by C-G formula based on Cr of 1.19).   Medical History: Past Medical History  Diagnosis Date  . Pericardial effusion     Chronic. Last echo in April of 2012.   . Atrial fibrillation (HCC)     Chronic  . Chronic anticoagulation     on Coumadin  . History of GI bleed     once treated with Fe infusion  . HTN (hypertension)   . Pacemaker     Medtronic, placed for tachybrady.   . Valvular heart disease   . Obesity   . LVH (left ventricular hypertrophy)   . Thrombophlebitis 1967    associated with pregnancy  . Diverticulosis   . Heart murmur   . Osteoporosis   . Hx of echocardiogram 07/28/2011    EF>55% small to moderate and is mostly located posteriorly. there is no overt evidence of tamponade although subtle diastolic collapse of the right  ventricle.  . Sleep apnea     mild-no cpap  . History of blood transfusion "several times"  . CHF (congestive heart failure) ([place])   . Pneumonia ~ 1986  . Positive TB test   . Anemia   . Iron deficiency anemia     "I've had an iron transfusion"  . Stroke [facility]) April 2013    right frontal  . Arthritis   . Tachycardia-bradycardia syndrome [facility])     s/p Medtronic Sensia  model number [id number], serial number [id number] H 12/2013  . Acute on chronic diastolic CHF (congestive heart failure), NYHA class 3 ([place]) 01/15/2015  . History of shingles    Assessment:  81 y/o F with SOB, pericardial effusion. Pharmacy consulted to dose heparin for atrial fibrillation. Pharmacy also consulted to dose warfarin from PTA. Pt is s/p vit K and has a sub-therapeutic INR. Hgb 12.7, plts 270.    Goal of Therapy:  Heparin level 0.3-0.7 units/ml Monitor platelets by anticoagulation protocol: Yes   Plan:  Heparin IV 3000u bolus, then Heparin gtt 1000 units/hr 8 hr HL Daily HL, CBC Monitor for S&S of bleed  Angela Burke, [name] Pharmacy Resident Pager: [phone] 01/20/2015,9:09 AM

## 2015-01-20 NOTE — Progress Notes (Addendum)
Patient Name: [name] Date of Encounter: 01/20/2015  Primary Cardiologist: Dr. Sallyanne Kuster   Principal Problem:   Pericardial effusion, Large Active Problems:   Chronic anticoagulation - Coumadin, CHADS2VASC=7   Supratherapeutic INR   Diabetes ([employer])   History of CVA (cerebrovascular accident)   Acute on chronic diastolic CHF (congestive heart failure), NYHA class 3 ([place])    SUBJECTIVE  Does not feel good this morning. Some SOB, still has a lot of cough, productive, thick brown sputum, chest hurt when cough.   CURRENT MEDS . acetaminophen  1,000 mg Oral 4 times per day   Or  . acetaminophen (TYLENOL) oral liquid 160 mg/5 mL  1,000 mg Oral 4 times per day  . antiseptic oral rinse  7 mL Mouth Rinse BID  . digoxin  0.125 mg Oral Daily  . enoxaparin (LOVENOX) injection  30 mg Subcutaneous Q12H  . furosemide  40 mg Intravenous Daily  . lidocaine  1 patch Transdermal Q24H  . multivitamin with minerals  1 tablet Oral Daily  . potassium chloride  20 mEq Oral BID  . senna  1 tablet Oral QHS  . verapamil  180 mg Oral QHS  . Warfarin - Pharmacist Dosing Inpatient   Does not apply q1800    OBJECTIVE  Filed Vitals:   01/19/15 2025 01/19/15 2055 01/19/15 2123 01/20/15 0527  BP:  122/52 126/53 134/50  Pulse:  60 60 64  Temp:  98.2 F (36.8 C)  98.1 F (36.7 C)  TempSrc:  Oral  Oral  Resp: 31 28 26 25   Weight:    160 lb 7.9 oz (72.8 kg)  SpO2:  94% 94% 90%    Intake/Output Summary (Last 24 hours) at 01/20/15 0731 Last data filed at 01/19/15 2310  Gross per 24 hour  Intake    150 ml  Output    400 ml  Net   -250 ml   Filed Weights   01/18/15 0500 01/19/15 0500 01/20/15 0527  Weight: 158 lb 8.2 oz (71.9 kg) 159 lb 9.6 oz (72.394 kg) 160 lb 7.9 oz (72.8 kg)    PHYSICAL EXAM  General: Pleasant, NAD. Neuro: Alert and oriented X 3. Moves all extremities spontaneously. Psych: Normal affect. HEENT:  Normal  Neck: Supple without bruits +JVD. Lungs:  Resp regular  and unlabored. Mild expiratory wheezing.  Heart: RRR no s3, s4, or murmurs. Prominant S1S2 Abdomen: Soft, non-tender, non-distended, BS + x 4.  Extremities: No clubbing, cyanosis. DP/PT/Radials 2+ and equal bilaterally. 2-3+ pitting edema  Accessory Clinical Findings  CBC  Recent Labs  01/18/15 0333 01/19/15 0258  WBC 8.8 10.1  HGB 12.3 12.7  HCT 38.8 39.4  MCV 92.4 91.8  PLT 238 [id number]   Basic Metabolic Panel  Recent Labs  01/18/15 0333 01/19/15 0949  NA 137 135  K 4.4 4.4  CL 98* 98*  CO2 31 26  GLUCOSE 121* 186*  BUN 16 24*  CREATININE 1.19* 1.19*  CALCIUM 9.3 9.1   Liver Function Tests  Recent Labs  01/18/15 0333  AST 27  ALT 21  ALKPHOS 79  BILITOT 1.6*  PROT 6.4*  ALBUMIN 3.1*    TELE ventricularly paced rhythm    ECG  No new EKG  Echocardiogram 01/15/2015  LV EF: 55% -  60%  ------------------------------------------------------------------- Indications:   CHF - 428.0. Pericardial effusion 423.9.  ------------------------------------------------------------------- History:  PMH: Pacemaker. Risk factors: Hypertension.  ------------------------------------------------------------------- Study Conclusions  - Left ventricle: The cavity size was normal. Wall thickness  was increased in a pattern of mild LVH. Systolic function was normal. The estimated ejection fraction was in the range of 55% to 60%. Wall motion was normal; there were no regional wall motion abnormalities. - Aortic valve: There was mild stenosis. There was mild to moderate regurgitation. Valve area (VTI): 1.44 cm^2. Valve area (Vmax): 1.36 cm^2. Valve area (Vmean): 1.37 cm^2. - Mitral valve: There was mild regurgitation. - Left atrium: The atrium was mildly dilated. - Right atrium: The atrium was moderately dilated. - Tricuspid valve: There was moderate regurgitation. - Pulmonary arteries: Systolic pressure was moderately increased. PA peak  pressure: 66 mm Hg (S). - Pericardium, extracardiac: A large, free-flowing pericardial effusion was identified. There was no evidence of hemodynamic compromise.     Radiology/Studies  Dg Chest 2 View  01/19/2015  CLINICAL DATA:  Pericardial effusion. EXAM: CHEST  2 VIEW COMPARISON:  01/18/2015 FINDINGS: Dual lead cardiac pacemaker with battery within the right chest wall is stable. Inferior approach mediastinal or pericardial drain has been removed. Cardiomediastinal silhouette is stably enlarged. Linear opacities in bilateral lung bases may represent atelectasis. There is persistent mild interstitial pulmonary edema. Small bilateral pleural effusions cannot be excluded. No evidence of pneumothorax. Osseous structures are without acute abnormality. Soft tissues are grossly normal. IMPRESSION: Status post removal of mediastinal or pericardial drain. No evidence of pneumothorax or pneumomediastinum. Stably enlarged cardiac silhouette with mild pulmonary edema and bibasilar atelectasis. Small bilateral pleural effusions cannot be excluded. Electronically Signed   By: Fidela Salisbury M.D.   On: 01/19/2015 09:25   Dg Chest 2 View  01/15/2015  CLINICAL DATA:  Preoperative examination. EXAM: CHEST  2 VIEW COMPARISON:  07/17/2014; 01/13/2014; 06/11/2012; 07/01/2011; chest CT - 07/04/2011 FINDINGS: Grossly unchanged marked enlargement of the cardiac silhouette and mediastinal contours with atherosclerotic plaque within thoracic aorta. Stable positioning of support apparatus. There is chronic atelectasis/collapse of the right middle lobe. Chronic pulmonary venous congestion without frank evidence of edema. There is persistent blunting of the bilobed costophrenic angles without definite pleural effusion. No pneumothorax. Unchanged bones. IMPRESSION: 1. Similar findings of marked cardiomegaly and pulmonary venous congestion without definite acute cardiopulmonary disease. 2. Chronic partial  atelectasis/collapse of the right middle lobe, similar compared to the 06/2011 examinations. Electronically Signed   By: Sandi Mariscal M.D.   On: 01/15/2015 16:13   Dg Chest Port 1 View  01/18/2015  CLINICAL DATA:  Chest tube placement EXAM: PORTABLE CHEST 1 VIEW COMPARISON:  12/22/6 FINDINGS: There is a right chest wall pacer device with lead in the right atrial appendage and right ventricle. The heart size is enlarged. There is aortic atherosclerosis. Bilateral pleural effusions and interstitial edema is identified compatible with CHF. Chest tube overlying the left lower pericardial space noted. IMPRESSION: 1. Stable CHF. 2. Aortic atherosclerosis. Electronically Signed   By: Kerby Moors M.D.   On: 01/18/2015 08:05   Dg Chest Port 1 View  01/17/2015  CLINICAL DATA:  Chest tube, pericardial effusion. EXAM: PORTABLE CHEST 1 VIEW COMPARISON:  Chest radiograph from one day prior. FINDINGS: Stable configuration of 3 lead right subclavian pacemaker and av surgical drain terminating over the left lower pericardial space. Stable cardiomediastinal silhouette with moderate cardiomegaly. No definite pneumothorax. Trace left pleural effusion, stable. Mild pulmonary edema appears slightly decreased. IMPRESSION: 1. Mild congestive heart failure, slightly improved. 2. Stable trace bilateral pleural effusions. Electronically Signed   By: Ilona Sorrel M.D.   On: 01/17/2015 07:44   Dg Chest Port 1 View  01/16/2015  CLINICAL  DATA:  Postop thoracic surgery. Chest tube in place. Subxiphoid pericardial window procedure. Evaluate for pneumothorax. EXAM: PORTABLE CHEST 1 VIEW COMPARISON:  01/15/2015 FINDINGS: Evidence for a surgical drain in the left lower chest. Again noted is enlargement of the cardiac silhouette with prominent perihilar densities and peribronchial thickening. Right cardiac pacemaker is again noted and leads appear to be grossly stable. Negative for a pneumothorax. IMPRESSION: Evidence for a surgical drain  in the left lower chest. Negative for pneumothorax. Enlarged central vascular structures with peribronchial thickening. Findings are suggestive for pulmonary edema. Electronically Signed   By: Markus Daft M.D.   On: 01/16/2015 10:35    ASSESSMENT AND PLAN  [name] is a 81 y.o. female with a history of pericardial effusion (dx 2012), perm afib w/ MDT PPM 12/2013 for slow VR, chronic coumadin, CVA, GIB 2nd AVMs admitted for worsening edema concerning for progression of pericardial tamponade. She had previously scheduled pericardial window surgery, but had to cancel due to shingles. She underwent subxyphoid pericardial window on 12/21 by Dr. Servando Snare.  1. Large pericardial effusion s/p pericardial window 12/21  - had increasing in size between July and Sep, previously scheduled for pericardial window, cancelled due to onset of shingles. Worsening edema seen in office on 12/19. Drain d/c'ed on 12/23  2. Acute on chronic diastolic HF  - s/p IV diuresis, S/p -2L. Still appear to be fluid overloaded, 2+ pitting edema, +JVD, O2 sat low 90s on [place], drop to 85 when off O2 and talking, change IV lasix to 40mg  BID. Compression stocking. Thick productive cough this morning, nonfebrile. Continue breathing treatment.    3. Chronic atrial fibrillation on coumadin  - CHA2DS2-Vasc score 4 (CVA, HF, female)  - s/p vit K reversal prior to pericardial window, coumadin restarted on 12/22  - controlled on digoxin and verapmil  4. Tachybrady s/p PPM 12/2013   SignedAlmyra Deforest PA-C Pager: [phone]  I have seen and examined the patient along with Almyra Deforest PA-C.  I have reviewed the chart, notes and new data.  I agree with PA's note.  Key new complaints: cough and copious sputum production persists; incisional pain makes it hard to clear the secretions with a strong cough Key examination changes: low oxygen saturations just with speaking, rhonchi and wheezes Key new findings / data: normal WBC yesterday;  CXR with pleural effusions, but no infiltrates.  PLAN: Unfortunately, still not ready for DC. No clear evidence for bacterial infection, but may need to consider antibiotics in this elderly and frail patient. Recheck CBC. Switch to PO diuretics. Will start IV heparin - she remains subtherapeutically anticoagulated (after  Vit K) and has had a CVA before.  Sanda Klein, MD, [place] [phone] 01/20/2015, 8:49 AM

## 2015-01-20 NOTE — Progress Notes (Addendum)
ANTICOAGULATION CONSULT NOTE - Follow Up Consult  Pharmacy Consult for warfarin Indication: atrial fibrillation  Allergies  Allergen Reactions  . Iodinated Diagnostic Agents Hives    HIVES 15MIN S/P IV CONTRAST INJECTION,WILL NEED 13 HR PREP FOR FUTURE INJECTIONS, ok s/p 50mg  po benadryl//a.calhoun  . Anti-Inflammatory Enzyme [Nutritional Supplements]     Retains fluids and headaches  . Arthrotec [Diclofenac-Misoprostol] Other (See Comments)    unknown  . Biaxin [Clarithromycin] Other (See Comments)    unknown  . Plavix [Clopidogrel Bisulfate] Other (See Comments)    unknown  . Spironolactone     Hair loss    Patient Measurements: Weight: 160 lb 7.9 oz (72.8 kg)  Vital Signs: Temp: 98.1 F (36.7 C) (12/25 0527) Temp Source: Oral (12/25 0527) BP: 134/50 mmHg (12/25 0527) Pulse Rate: 64 (12/25 0527)  Labs:  Recent Labs  01/18/15 0333 01/19/15 0258 01/19/15 0949 01/20/15 0225  HGB 12.3 12.7  --   --   HCT 38.8 39.4  --   --   PLT 238 270  --   --   LABPROT 19.2* 19.8*  --  19.1*  INR 1.62* 1.69*  --  1.60*  CREATININE 1.19*  --  1.19*  --     Estimated Creatinine Clearance: 32 mL/min (by C-G formula based on Cr of 1.19).   Medical History: Past Medical History  Diagnosis Date  . Pericardial effusion     Chronic. Last echo in April of 2012.   . Atrial fibrillation (H[facility])     Chronic  . Chronic anticoagulation     on Coumadin  . History of GI bleed     once treated with Fe infusion  . HTN (hypertension)   . Pacemaker     Medtronic, placed for tachybrady.   . Valvular heart disease   . Obesity   . LVH (left ventricular hypertrophy)   . Thrombophlebitis 1967    associated with pregnancy  . Diverticulosis   . Heart murmur   . Osteoporosis   . Hx of echocardiogram 07/28/2011    EF>55% small to moderate and is mostly located posteriorly. there is no overt evidence of tamponade although subtle diastolic collapse of the right ventricle.  . Sleep apnea    mild-no cpap  . History of blood transfusion "several times"  . CHF (congestive heart failure) ([place])   . Pneumonia ~ 1986  . Positive TB test   . Anemia   . Iron deficiency anemia     "I've had an iron transfusion"  . Stroke [facility]) April 2013    right frontal  . Arthritis   . Tachycardia-bradycardia syndrome [facility])     s/p Medtronic Sensia  model number [id number], serial number [id number] H 12/2013  . Acute on chronic diastolic CHF (congestive heart failure), NYHA class 3 ([place]) 01/15/2015  . History of shingles     Assessment:  81 y/o F with SOB, pericardial effusion. Pharmacy consulted to dose warfarin from PTA.  Warfarin on hold but resumed 12/22. Missed warfarin dose on 12/23 - INR 1.71>1.62>1.69>1.60, hgb 12.7, plts 270, received vit K PO 2mg  on 12/20 - s/p pericardial window 12/21 - also on lovenox prophylaxis, no bleeding noted  PTA warfarin dose 2.5 mg daily except 5 mg on MWF  Goal of Therapy:  INR 2-3 Monitor platelets by anticoagulation protocol: Yes   Plan:  - Warfarin 5 mg po x 1 tonight - Restart home dose once INR therapeutic - Enoxaparin 30 SQ daily  - Daily INR, CBC -  Monitor for S&S of bleed  Angela Burke, [name] Pharmacy Resident Pager: [phone] 01/20/2015,8:21 AM

## 2015-01-21 ENCOUNTER — Inpatient Hospital Stay (HOSPITAL_COMMUNITY): Payer: Medicare Other

## 2015-01-21 DIAGNOSIS — R05 Cough: Secondary | ICD-10-CM

## 2015-01-21 DIAGNOSIS — J189 Pneumonia, unspecified organism: Secondary | ICD-10-CM

## 2015-01-21 DIAGNOSIS — R058 Other specified cough: Secondary | ICD-10-CM | POA: Insufficient documentation

## 2015-01-21 DIAGNOSIS — R062 Wheezing: Secondary | ICD-10-CM

## 2015-01-21 LAB — CULTURE, BODY FLUID-BOTTLE: CULTURE: NO GROWTH

## 2015-01-21 LAB — HEPARIN LEVEL (UNFRACTIONATED)
HEPARIN UNFRACTIONATED: 0.28 [IU]/mL — AB (ref 0.30–0.70)
HEPARIN UNFRACTIONATED: 0.21 [IU]/mL — AB (ref 0.30–0.70)
Heparin Unfractionated: 0.58 IU/mL (ref 0.30–0.70)

## 2015-01-21 LAB — CBC
HCT: 36.3 % (ref 36.0–46.0)
HEMOGLOBIN: 11.4 g/dL — AB (ref 12.0–15.0)
MCH: 28.9 pg (ref 26.0–34.0)
MCHC: 31.4 g/dL (ref 30.0–36.0)
MCV: 91.9 fL (ref 78.0–100.0)
PLATELETS: 251 10*3/uL (ref 150–400)
RBC: 3.95 MIL/uL (ref 3.87–5.11)
RDW: 15.1 % (ref 11.5–15.5)
WBC: 6 10*3/uL (ref 4.0–10.5)

## 2015-01-21 LAB — BASIC METABOLIC PANEL
ANION GAP: 10 (ref 5–15)
BUN: 22 mg/dL — ABNORMAL HIGH (ref 6–20)
CALCIUM: 9.2 mg/dL (ref 8.9–10.3)
CO2: 28 mmol/L (ref 22–32)
CREATININE: 1.02 mg/dL — AB (ref 0.44–1.00)
Chloride: 101 mmol/L (ref 101–111)
GFR calc non Af Amer: 47 mL/min — ABNORMAL LOW (ref >60)
GFR, EST AFRICAN AMERICAN: 55 mL/min — AB (ref >60)
Glucose, Bld: 104 mg/dL — ABNORMAL HIGH (ref 65–99)
Potassium: 5.2 mmol/L — ABNORMAL HIGH (ref 3.5–5.1)
SODIUM: 139 mmol/L (ref 135–145)

## 2015-01-21 LAB — CULTURE, BODY FLUID W GRAM STAIN -BOTTLE: Culture: NO GROWTH

## 2015-01-21 LAB — PROTIME-INR
INR: 1.84 — AB (ref 0.00–1.49)
PROTHROMBIN TIME: 21.2 s — AB (ref 11.6–15.2)

## 2015-01-21 LAB — PROCALCITONIN: PROCALCITONIN: 0.13 ng/mL

## 2015-01-21 MED ORDER — ALBUTEROL SULFATE (2.5 MG/3ML) 0.083% IN NEBU
2.5000 mg | INHALATION_SOLUTION | Freq: Two times a day (BID) | RESPIRATORY_TRACT | Status: DC
  Administered 2015-01-21 – 2015-01-22 (×2): 2.5 mg via RESPIRATORY_TRACT
  Filled 2015-01-21 (×2): qty 3

## 2015-01-21 MED ORDER — ALBUTEROL SULFATE (2.5 MG/3ML) 0.083% IN NEBU
2.5000 mg | INHALATION_SOLUTION | Freq: Four times a day (QID) | RESPIRATORY_TRACT | Status: DC
  Administered 2015-01-21: 2.5 mg via RESPIRATORY_TRACT
  Filled 2015-01-21: qty 3

## 2015-01-21 MED ORDER — FLUTICASONE PROPIONATE 50 MCG/ACT NA SUSP
2.0000 | Freq: Two times a day (BID) | NASAL | Status: DC
  Administered 2015-01-21 – 2015-01-26 (×11): 2 via NASAL
  Filled 2015-01-21: qty 16

## 2015-01-21 MED ORDER — LEVOFLOXACIN 750 MG PO TABS
750.0000 mg | ORAL_TABLET | ORAL | Status: DC
  Administered 2015-01-21: 750 mg via ORAL
  Filled 2015-01-21: qty 1

## 2015-01-21 MED ORDER — HEPARIN (PORCINE) IN NACL 100-0.45 UNIT/ML-% IJ SOLN
1100.0000 [IU]/h | INTRAMUSCULAR | Status: DC
  Administered 2015-01-22: 1200 [IU]/h via INTRAVENOUS
  Filled 2015-01-21: qty 250

## 2015-01-21 MED ORDER — VANCOMYCIN HCL IN DEXTROSE 1-5 GM/200ML-% IV SOLN
1000.0000 mg | INTRAVENOUS | Status: DC
  Administered 2015-01-21: 1000 mg via INTRAVENOUS
  Filled 2015-01-21 (×2): qty 200

## 2015-01-21 MED ORDER — ALBUTEROL SULFATE (2.5 MG/3ML) 0.083% IN NEBU
2.5000 mg | INHALATION_SOLUTION | RESPIRATORY_TRACT | Status: DC | PRN
Start: 2015-01-21 — End: 2015-01-23

## 2015-01-21 MED ORDER — WARFARIN SODIUM 2.5 MG PO TABS
2.5000 mg | ORAL_TABLET | Freq: Once | ORAL | Status: AC
  Administered 2015-01-21: 2.5 mg via ORAL
  Filled 2015-01-21: qty 1

## 2015-01-21 MED ORDER — METHYLPREDNISOLONE SODIUM SUCC 125 MG IJ SOLR
60.0000 mg | Freq: Once | INTRAMUSCULAR | Status: AC
  Administered 2015-01-21: 60 mg via INTRAVENOUS
  Filled 2015-01-21: qty 2

## 2015-01-21 MED ORDER — PIPERACILLIN-TAZOBACTAM 3.375 G IVPB
3.3750 g | Freq: Three times a day (TID) | INTRAVENOUS | Status: DC
  Administered 2015-01-21 – 2015-01-25 (×12): 3.375 g via INTRAVENOUS
  Filled 2015-01-21 (×15): qty 50

## 2015-01-21 MED ORDER — WARFARIN SODIUM 5 MG PO TABS: 5.0000 mg | ORAL_TABLET | Freq: Once | ORAL | Status: DC

## 2015-01-21 NOTE — Progress Notes (Signed)
ANTICOAGULATION CONSULT NOTE - Initial Consult  Pharmacy Consult for heparin/coumadin/vanc/zosyn Indication: atrial fibrillation/PNA  Allergies  Allergen Reactions  . Iodinated Diagnostic Agents Hives    HIVES 15MIN S/P IV CONTRAST INJECTION,WILL NEED 13 HR PREP FOR FUTURE INJECTIONS, ok s/p 50mg  po benadryl//a.calhoun  . Anti-Inflammatory Enzyme [Nutritional Supplements]     Retains fluids and headaches  . Arthrotec [Diclofenac-Misoprostol] Other (See Comments)    unknown  . Biaxin [Clarithromycin] Other (See Comments)    unknown  . Plavix [Clopidogrel Bisulfate] Other (See Comments)    unknown  . Spironolactone     Hair loss    Patient Measurements: Height: 5\' 5"  (165.1 cm) Weight: 161 lb 9.6 oz (73.3 kg) IBW/kg (Calculated) : 57 Heparin Dosing Weight: 72 kg  Vital Signs: Temp: 98.3 F (36.8 C) (12/26 0320) Temp Source: Oral (12/26 0320) BP: 123/59 mmHg (12/26 0320) Pulse Rate: 68 (12/26 0834)  Labs:  Recent Labs  01/19/15 0258 01/19/15 0949 01/20/15 0225 01/21/15 0245 01/21/15 1030  HGB 12.7  --   --  11.4*  --   HCT 39.4  --   --  36.3  --   PLT 270  --   --  251  --   LABPROT 19.8*  --  19.1*  --  21.2*  INR 1.69*  --  1.60*  --  1.84*  HEPARINUNFRC  --   --   --  0.28* 0.21*  CREATININE  --  1.19*  --  1.02*  --     Estimated Creatinine Clearance: 37.5 mL/min (by C-G formula based on Cr of 1.02).   Medical History: Past Medical History  Diagnosis Date  . Pericardial effusion     Chronic. Last echo in April of 2012.   . Atrial fibrillation (HCC)     Chronic  . Chronic anticoagulation     on Coumadin  . History of GI bleed     once treated with Fe infusion  . HTN (hypertension)   . Pacemaker     Medtronic, placed for tachybrady.   . Valvular heart disease   . Obesity   . LVH (left ventricular hypertrophy)   . Thrombophlebitis 1967    associated with pregnancy  . Diverticulosis   . Heart murmur   . Osteoporosis   . Hx of echocardiogram  07/28/2011    EF>55% small to moderate and is mostly located posteriorly. there is no overt evidence of tamponade although subtle diastolic collapse of the right ventricle.  . Sleep apnea     mild-no cpap  . History of blood transfusion "several times"  . CHF (congestive heart failure) ([place])   . Pneumonia ~ 1986  . Positive TB test   . Anemia   . Iron deficiency anemia     "I've had an iron transfusion"  . Stroke [facility]) April 2013    right frontal  . Arthritis   . Tachycardia-bradycardia syndrome [facility])     s/p Medtronic Sensia  model number [id number], serial number [id number] H 12/2013  . Acute on chronic diastolic CHF (congestive heart failure), NYHA class 3 ([place]) 01/15/2015  . History of shingles    Assessment:  81 y/o F with SOB, pericardial effusion. Pharmacy consulted to dose heparin for atrial fibrillation. Pharmacy also consulted to dose warfarin from PTA. Pt is s/p vit K and has a sub-therapeutic INR. Heparin has been used for bridging but subtherapeutic this AM. CCM has also added vanc/zosyn for possible HAP.   Goal of Therapy:  Heparin level 0.3-0.7  units/ml Monitor platelets by anticoagulation protocol: Yes   Plan:   Increase Heparin gtt 1200 units/hr 8 hr HL Coumadin 5mg  PO x1 Daily HL, PT/INR, CBC Vanc 1g IV q24 Zosyn 3.375g IV q8 VT as needed  Onnie Boer, [name] Pager: [phone] 01/21/2015 1:18 PM

## 2015-01-21 NOTE — Progress Notes (Signed)
       [address]       [place] [address]             [phone]          5 Days Post-Op Procedure(s) (LRB): SUBXYPHOID PERICARDIAL WINDOW (N/A) TRANSESOPHAGEAL ECHOCARDIOGRAM (TEE) (N/A)  Subjective: Rested poorly overnight. Still with productive cough.   Objective: Vital signs in last 24 hours: Patient Vitals for the past 24 hrs:  BP Temp Temp src Pulse Resp SpO2 Height Weight  01/21/15 0320 (!) 123/59 mmHg 98.3 F (36.8 C) Oral 62 (!) 24 94 % - 161 lb 9.6 oz (73.3 kg)  01/20/15 2154 (!) 136/51 mmHg - - - - - - -  01/20/15 2053 - - - - - 95 % - -  01/20/15 2000 (!) 131/51 mmHg 98.8 F (37.1 C) Oral 60 (!) 26 95 % - -  01/20/15 1700 - - - - - - 5\' 5"  (1.651 m) -  01/20/15 1502 (!) 121/43 mmHg 99 F (37.2 C) Oral 66 (!) 25 92 % - -  01/20/15 1341 - - - - - 93 % - -  01/20/15 0911 - - - 62 - - - -   Current Weight  01/21/15 161 lb 9.6 oz (73.3 kg)     Intake/Output from previous day: 12/25 0701 - 12/26 0700 In: 836.3 [P.O.:630; I.V.:206.3] Out: 750 [Urine:750]    PHYSICAL EXAM:  Heart: RRR Lungs: Coarse rhonchi bilaterally Wound: Clean and dry     Lab Results: CBC: Recent Labs  01/19/15 0258 01/21/15 0245  WBC 10.1 6.0  HGB 12.7 11.4*  HCT 39.4 36.3  PLT 270 251   BMET:  Recent Labs  01/19/15 0949 01/21/15 0245  NA 135 139  K 4.4 5.2*  CL 98* 101  CO2 26 28  GLUCOSE 186* 104*  BUN 24* 22*  CREATININE 1.19* 1.02*  CALCIUM 9.1 9.2    PT/INR:  Recent Labs  01/20/15 0225  LABPROT 19.1*  INR 1.60*      Assessment/Plan: S/P Procedure(s) (LRB): SUBXYPHOID PERICARDIAL WINDOW (N/A) TRANSESOPHAGEAL ECHOCARDIOGRAM (TEE) (N/A) Stable from surgical standpoint.  Continue diuresis, pulm toilet. Medical issues per cardiology.   LOS: 6 days    Newel Oien H 01/21/2015

## 2015-01-21 NOTE — Progress Notes (Signed)
Utilization review completed.  

## 2015-01-21 NOTE — Progress Notes (Signed)
ANTICOAGULATION CONSULT NOTE - Follow Up Consult  Pharmacy Consult for heparin Indication: atrial fibrillation   Labs:  Recent Labs  01/19/15 0258 01/19/15 0949 01/20/15 0225 01/21/15 0245  HGB 12.7  --   --  11.4*  HCT 39.4  --   --  36.3  PLT 270  --   --  251  LABPROT 19.8*  --  19.1*  --   INR 1.69*  --  1.60*  --   HEPARINUNFRC  --   --   --  0.28*  CREATININE  --  1.19*  --   --      Assessment/Plan:  81yo female subtherapeutic on heparin after started for low INR though RN reports that IV was lost ~44min and had asked lab to draw labs later to make up for time heparin was off but lab was drawn early.  Will continue gtt at current rate for now and recheck level.  Wynona Neat, [name], BCPS  01/21/2015,3:37 AM

## 2015-01-21 NOTE — Progress Notes (Signed)
ANTICOAGULATION CONSULT NOTE - Follow Up Consult  Pharmacy Consult for heparin Indication: atrial fibrillation  Labs:  Recent Labs  01/19/15 0258 01/19/15 0949 01/20/15 0225 01/21/15 0245 01/21/15 1030 01/21/15 2059  HGB 12.7  --   --  11.4*  --   --   HCT 39.4  --   --  36.3  --   --   PLT 270  --   --  251  --   --   LABPROT 19.8*  --  19.1*  --  21.2*  --   INR 1.69*  --  1.60*  --  1.84*  --   HEPARINUNFRC  --   --   --  0.28* 0.21* 0.58  CREATININE  --  1.19*  --  1.02*  --   --     Assessment:   81yo female on heparin/warfarin for atrial fibrillation. HL this pm 0.58 (goal 0.3-0.7) on 1200 units/hr.   Goal of Therapy:  Heparin level 0.3-0.7 units/ml Monitor platelets by anticoagulation protocol: Yes  Plan:  1. Continue heparin at current rate of 1200 units/hr 2. HL in am  Vincenza Hews, [name], BCPS 01/21/2015, 9:39 PM Pager: [phone]

## 2015-01-21 NOTE — Consult Note (Signed)
Name: [name] MRN: [id number] DOB: [birth year]    ADMISSION DATE:  01/15/2015 CONSULTATION DATE: 12/26  REFERRING MD :  Croitoru  CHIEF COMPLAINT:  Wheezing   BRIEF PATIENT DESCRIPTION:  81 year old female w/ CAF, diastolic HF, and progressive pericardial effusion. Admitted for diuresis and pericardial window. Window was completed 12/21. Drain removed 12/23. PCCM asked to eval 12/27 for cough, recurrent dyspnea and wheezing first noted on 12/24 and progressed (w/ room air sats 85%) in spite of diuretics and bronchodilators.   SIGNIFICANT EVENTS  12/21 subxiphoid pericardial window.   STUDIES:     HISTORY OF PRESENT ILLNESS:   81 year old female w/ h/o afib w/ PPM for slow VR (on chronic coumadin) and chronic pericardial effusion which had been growing in size. This was initially scheduled for planned window but this was cancelled as pt developed shingles. Subsequently she had developed progressive symptom burden in the form of increased exertional dyspnea, 10 lb wt gain, orthopnea and frequent PND. She was admitted w/ decompensated HF and also for possible window on 12/20. She underwent subxiphoid pericardial window on 12/21. Her pericardial drain was discontinued 12/23. Path and micro from window was non-diagnostic. On 12/24 she began to co wheezing and requiring oxygen. Diuretics escalated. 12/25 more short of breath, coughing w/ thick brown sputum and CP w/ cough. Aggressive pulmonary hygiene measures attempted and bronchodilators prescribed. On 12/26 work of breathing worse, O2 requirements higher because of this PCCM was asked to eval.   PAST MEDICAL HISTORY :   has a past medical history of Pericardial effusion; Atrial fibrillation ([place]); Chronic anticoagulation; History of GI bleed; HTN (hypertension); Pacemaker; Valvular heart disease; Obesity; LVH (left ventricular hypertrophy); Thrombophlebitis (1967); Diverticulosis; Heart murmur; Osteoporosis; echocardiogram  (07/28/2011); Sleep apnea; History of blood transfusion ("several times"); CHF (congestive heart failure) ([place]); Pneumonia (~ 1986); Positive TB test; Anemia; Iron deficiency anemia; Stroke [facility]) (April 2013); Arthritis; Tachycardia-bradycardia syndrome ([place]); Acute on chronic diastolic CHF (congestive heart failure), NYHA class 3 ([place]) (01/15/2015); and History of shingles.  has past surgical history that includes Breast lumpectomy (Bilateral); Esophagogastroduodenoscopy (05/19/2011); Cholecystectomy (2005); Esophagogastroduodenoscopy (06/22/2011); Givens capsule study (06/23/2011); Colonoscopy (08/11/2011); Hot hemostasis (08/11/2011); Mass excision (Left, 09/14/2013); Tonsillectomy and adenoidectomy (1950); Insert / replace / remove pacemaker (2001); Cataract extraction w/ intraocular lens  implant, bilateral (Bilateral); Lipoma excision (Left, 07/2013); pacemaker generator change (N/A, 01/02/2014); Lead revision (N/A, 01/02/2014); Subxyphoid pericardial window (N/A, 01/16/2015); and TEE without cardioversion (N/A, 01/16/2015). Prior to Admission medications   Medication Sig Start Date End Date Taking? Authorizing Provider  acetaminophen-codeine (TYLENOL #3) 300-30 MG tablet Take 1-2 tablets by mouth every 6 (six) hours as needed for moderate pain (shingles). 01/02/15  Yes Leandrew Koyanagi, MD  DIGITEK 125 MCG tablet TAKE 1 TABLET(S) BY MOUTH DAILY 03/28/14  Yes Darlyne Russian, MD  potassium chloride (K-DUR,KLOR-CON) 10 MEQ tablet take 2 tablets by mouth once daily WHEN YOU TAKE TORSEMIDE Patient taking differently: TAKE 20 MEQ BY MOUTH DAILY 08/13/14  Yes Mihai Croitoru, MD  senna (SENOKOT) 8.6 MG TABS tablet Take 1 tablet by mouth at bedtime.   Yes Historical Provider, MD  torsemide (DEMADEX) 20 MG tablet Take 2 tablets (40 mg total) by mouth daily. Patient taking differently: Take 40 mg by mouth daily. Take an extra tablet every other day 05/22/14  Yes Mihai Croitoru, MD  verapamil (CALAN-SR) 180 MG CR tablet  Take 1 tablet (180 mg total) by mouth at bedtime. Patient taking differently: Take 180 mg by  mouth daily.  03/26/14  Yes Mihai Croitoru, MD  warfarin (COUMADIN) 5 MG tablet Take 1 tablet by mouth daily or as directed by coumadin clinic Patient taking differently: Take 2.5-5 mg by mouth daily at 6 PM. Take 5 mg by mouth daily on Mon, Wed, and Friday. Take 2.5 mg by mouth on all other days. 12/14/14  Yes Mihai Croitoru, MD  codeine 15 MG tablet Take 1 tablet (15 mg total) by mouth every 6 (six) hours as needed. Patient not taking: Reported on 01/15/2015 07/17/14   Darlyne Russian, MD  valACYclovir (VALTREX) 1000 MG tablet Take 1 tablet (1,000 mg total) by mouth 3 (three) times daily. Patient not taking: Reported on 01/15/2015 12/16/14   Leandrew Koyanagi, MD   Allergies  Allergen Reactions  . Iodinated Diagnostic Agents Hives    HIVES 15MIN S/P IV CONTRAST INJECTION,WILL NEED 13 HR PREP FOR FUTURE INJECTIONS, ok s/p 50mg  po benadryl//a.calhoun  . Anti-Inflammatory Enzyme [Nutritional Supplements]     Retains fluids and headaches  . Arthrotec [Diclofenac-Misoprostol] Other (See Comments)    unknown  . Biaxin [Clarithromycin] Other (See Comments)    unknown  . Plavix [Clopidogrel Bisulfate] Other (See Comments)    unknown  . Spironolactone     Hair loss    FAMILY HISTORY:  family history includes Pneumonia in her father; Stroke in her father and mother. SOCIAL HISTORY:  reports that she has never smoked. She has never used smokeless tobacco. She reports that she drinks alcohol. She reports that she does not use illicit drugs.  REVIEW OF SYSTEMS (bolds +):   Constitutional: Negative for fever, chills, weight loss, malaise/fatigue and diaphoresis.  HENT: Negative for hearing loss, ear pain, nosebleeds, nasal congestion, sore throat, neck pain, tinnitus and ear discharge.   Eyes: Negative for blurred vision, double vision, photophobia, pain, discharge and redness.  Respiratory:  cough,  hemoptysis, sputum production, brown/dk, now more clear, shortness of breath, wheezing and stridor.   Cardiovascular: Negative for chest pain, palpitations, orthopnea, claudication, leg swelling and PND.  Gastrointestinal: Negative for heartburn, nausea, vomiting, abdominal pain, diarrhea, constipation, blood in stool and melena.  Genitourinary: Negative for dysuria, urgency, frequency, hematuria and flank pain.  Musculoskeletal: Negative for myalgias, back pain, joint pain and falls.  Skin: Negative for itching and rash.  Neurological: Negative for dizziness, tingling, tremors, sensory change, speech change, focal weakness, seizures, loss of consciousness, weakness and headaches.  Endo/Heme/Allergies: Negative for environmental allergies and polydipsia. Does not bruise/bleed easily.  SUBJECTIVE:  Does not feel well  VITAL SIGNS: Temp:  [98.3 F (36.8 C)-99 F (37.2 C)] 98.3 F (36.8 C) (12/26 0320) Pulse Rate:  [60-68] 68 (12/26 0834) Resp:  [24-26] 24 (12/26 0320) BP: (121-136)/(43-59) 123/59 mmHg (12/26 0320) SpO2:  [92 %-95 %] 94 % (12/26 0320) Weight:  [73.3 kg (161 lb 9.6 oz)] 73.3 kg (161 lb 9.6 oz) (12/26 0320)  PHYSICAL EXAMINATION: General:  Frail white female, sitting up in bed. No distress.  Neuro:  Awake, alert, no focal def  HEENT: NCAT, no JVd  Cardiovascular:  Reg irreg, III/VI SEM; + LE edema  Lungs:  Crackles bilaterally. No accessory muscle use. Exp wheeze transmitted from upper airway  Abdomen:  Soft, not tender. + bowel sounds  Musculoskeletal:  Equal w/ good bulk Skin:  Dry and intact    Recent Labs Lab 01/18/15 0333 01/19/15 0949 01/21/15 0245  NA 137 135 139  K 4.4 4.4 5.2*  CL 98* 98* 101  CO2 31 26  28  BUN 16 24* 22*  CREATININE 1.19* 1.19* 1.02*  GLUCOSE 121* 186* 104*    Recent Labs Lab 01/18/15 0333 01/19/15 0258 01/21/15 0245  HGB 12.3 12.7 11.4*  HCT 38.8 39.4 36.3  WBC 8.8 10.1 6.0  PLT 238 270 251   Lab Results  Component  Value Date   INR 1.84* 01/21/2015   INR 1.60* 01/20/2015   INR 1.69* 01/19/2015    No results found. PCXR w/ what looks like evolving RLL airspace disease   ASSESSMENT / PLAN: Acute hypoxic respiratory failure in setting of HCAP VS ATX +/- element of pulmonary edema.  Symptom burden likely complicated by Post-nasal gtt and Upper airway wheeze.   Plan Change to HCAP coverage Nasal steroids Scheduled BDs Add flutter Cont lasix as BUN/creatinine tolerate Ck PCT  Consider repeat ECHO to r/o re-occuring effusion as etiology of dyspnea if above does not help  AF/diastolic HF/ recent Pericardial effusion and hyperkalemia  Plan Per cards.  Repeat chemistry  Erick Colace ACNP-BC [place] Pager # [phone] OR # [phone] if no answer   01/21/2015, 12:14 PM

## 2015-01-21 NOTE — Progress Notes (Signed)
Patient Name: [name] Date of Encounter: 01/21/2015  Principal Problem:   Pericardial effusion, Large Active Problems:   Chronic anticoagulation - Coumadin, CHADS2VASC=7   Supratherapeutic INR   Diabetes ([place])   History of CVA (cerebrovascular accident)   Acute on chronic diastolic CHF (congestive heart failure), NYHA class 3 ([place])   Acute bronchitis   Length of Stay: 6  SUBJECTIVE   Still tachypneic, coughing thick mucus and wheezing. On O2 5L by [place]  CURRENT MEDS . acetaminophen  1,000 mg Oral 4 times per day   Or  . acetaminophen (TYLENOL) oral liquid 160 mg/5 mL  1,000 mg Oral 4 times per day  . antiseptic oral rinse  7 mL Mouth Rinse BID  . dextromethorphan-guaiFENesin  1 tablet Oral BID  . digoxin  0.125 mg Oral Daily  . levofloxacin  500 mg Oral Daily  . lidocaine  1 patch Transdermal Q24H  . methylPREDNISolone sodium succinate  60 mg Intravenous Once  . multivitamin with minerals  1 tablet Oral Daily  . potassium chloride  20 mEq Oral BID  . senna  1 tablet Oral QHS  . torsemide  60 mg Oral Daily  . verapamil  180 mg Oral QHS  . Warfarin - Pharmacist Dosing Inpatient   Does not apply q1800    OBJECTIVE   Intake/Output Summary (Last 24 hours) at 01/21/15 0906 Last data filed at 01/21/15 0900  Gross per 24 hour  Intake 976.33 ml  Output    750 ml  Net 226.33 ml   Filed Weights   01/19/15 0500 01/20/15 0527 01/21/15 0320  Weight: 159 lb 9.6 oz (72.394 kg) 160 lb 7.9 oz (72.8 kg) 161 lb 9.6 oz (73.3 kg)    PHYSICAL EXAM Filed Vitals:   01/20/15 2053 01/20/15 2154 01/21/15 0320 01/21/15 0834  BP:  136/51 123/59   Pulse:   62 68  Temp:   98.3 F (36.8 C)   TempSrc:   Oral   Resp:   24   Height:      Weight:   161 lb 9.6 oz (73.3 kg)   SpO2: 95%  94%    General: Alert, oriented x3, no distress Head: no evidence of trauma, PERRL, EOMI, no exophtalmos or lid lag, no myxedema, no xanthelasma; normal ears, nose and oropharynx Neck: normal  jugular venous pulsations and no hepatojugular reflux; brisk carotid pulses without delay and no carotid bruits Chest: labored breathing, bilateral wheezing and rhoncji, no signs of consolidation by percussion or palpation, normal fremitus,  Cardiovascular: normal position and quality of the apical impulse, regular rhythm, normal first and paradoxically splitr second heart sounds, no rubs or gallops, no murmur Abdomen: no tenderness or distention, no masses by palpation, no abnormal pulsatility or arterial bruits, normal bowel sounds, no hepatosplenomegaly Extremities: no clubbing, cyanosis; 2+ symmetricaledema; 2+ radial, ulnar and brachial pulses bilaterally; 2+ right femoral, posterior tibial and dorsalis pedis pulses; 2+ left femoral, posterior tibial and dorsalis pedis pulses; no subclavian or femoral bruits Neurological: grossly nonfocal  LABS  CBC  Recent Labs  01/19/15 0258 01/21/15 0245  WBC 10.1 6.0  HGB 12.7 11.4*  HCT 39.4 36.3  MCV 91.8 91.9  PLT 270 [id number]   Basic Metabolic Panel  Recent Labs  01/19/15 0949 01/21/15 0245  NA 135 139  K 4.4 5.2*  CL 98* 101  CO2 26 28  GLUCOSE 186* 104*  BUN 24* 22*  CREATININE 1.19* 1.02*  CALCIUM 9.1 9.2    Radiology Studies  Imaging results have been reviewed and No results found.  TELE Mostly V paced, occ conducted AFib, no RVR   ASSESSMENT AND PLAN  1.Pericardial effusion s/p pericardial window. Cultures negative, pathology/cytology nondiagnostic. 2. Acute on chronic diastolic CHF: diuresed [id number] since admission.Appears euvolemic. On PO diuretics, close to usual home dose. In/out matched, stable weight and renal function 3. Chronic anticoagulation with Coumadin: Coumadin had been on hold, reversed with Vit K. Now back on coumadin per pharmacy since 12/24. INR subtherapeutic. On IV heparin - she has had a previous stroke. INR pending today. 4. Chronic atrial fibrillation rate controlled on digoxin and Verapamil. V  paced  5. Tachybrady syndrome s/p PPM 12/2013 6. Acute bronchitis versus early pneumonia? No fever, but may be somewhat immunosuppressed (recent shingles). Never smoker. Will give one dose of solumedrol, reluctant to prescribe more steroids with recent shingles. Elderly and frail, will start antibiotics. CXR tomorrow. 7. Hyperkalemia - unexpected (hemolysis?). Hold supplements and recheck in AM    Sanda Klein, MD, [facility] [phone] office [phone] pager 01/21/2015 9:06 AM

## 2015-01-22 ENCOUNTER — Inpatient Hospital Stay (HOSPITAL_COMMUNITY): Payer: Medicare Other

## 2015-01-22 ENCOUNTER — Other Ambulatory Visit: Payer: Self-pay | Admitting: Internal Medicine

## 2015-01-22 DIAGNOSIS — J209 Acute bronchitis, unspecified: Secondary | ICD-10-CM

## 2015-01-22 LAB — BASIC METABOLIC PANEL
ANION GAP: 8 (ref 5–15)
BUN: 28 mg/dL — ABNORMAL HIGH (ref 6–20)
CHLORIDE: 99 mmol/L — AB (ref 101–111)
CO2: 31 mmol/L (ref 22–32)
Calcium: 9.7 mg/dL (ref 8.9–10.3)
Creatinine, Ser: 1.49 mg/dL — ABNORMAL HIGH (ref 0.44–1.00)
GFR calc non Af Amer: 30 mL/min — ABNORMAL LOW (ref >60)
GFR, EST AFRICAN AMERICAN: 35 mL/min — AB (ref >60)
Glucose, Bld: 184 mg/dL — ABNORMAL HIGH (ref 65–99)
Potassium: 4.9 mmol/L (ref 3.5–5.1)
Sodium: 138 mmol/L (ref 135–145)

## 2015-01-22 LAB — PROTIME-INR
INR: 2.14 — ABNORMAL HIGH (ref 0.00–1.49)
Prothrombin Time: 23.8 seconds — ABNORMAL HIGH (ref 11.6–15.2)

## 2015-01-22 LAB — CBC
HEMATOCRIT: 36.1 % (ref 36.0–46.0)
Hemoglobin: 11.4 g/dL — ABNORMAL LOW (ref 12.0–15.0)
MCH: 28.6 pg (ref 26.0–34.0)
MCHC: 31.6 g/dL (ref 30.0–36.0)
MCV: 90.5 fL (ref 78.0–100.0)
Platelets: 280 10*3/uL (ref 150–400)
RBC: 3.99 MIL/uL (ref 3.87–5.11)
RDW: 14.9 % (ref 11.5–15.5)
WBC: 5.1 10*3/uL (ref 4.0–10.5)

## 2015-01-22 LAB — BRAIN NATRIURETIC PEPTIDE: B Natriuretic Peptide: 356.3 pg/mL — ABNORMAL HIGH (ref 0.0–100.0)

## 2015-01-22 LAB — HEPARIN LEVEL (UNFRACTIONATED): Heparin Unfractionated: 0.77 IU/mL — ABNORMAL HIGH (ref 0.30–0.70)

## 2015-01-22 MED ORDER — BUDESONIDE 0.25 MG/2ML IN SUSP
0.2500 mg | Freq: Two times a day (BID) | RESPIRATORY_TRACT | Status: DC
  Administered 2015-01-22 – 2015-01-26 (×9): 0.25 mg via RESPIRATORY_TRACT
  Filled 2015-01-22 (×8): qty 2

## 2015-01-22 MED ORDER — ALBUTEROL SULFATE (2.5 MG/3ML) 0.083% IN NEBU
2.5000 mg | INHALATION_SOLUTION | Freq: Four times a day (QID) | RESPIRATORY_TRACT | Status: DC
  Administered 2015-01-22 – 2015-01-23 (×3): 2.5 mg via RESPIRATORY_TRACT
  Filled 2015-01-22 (×3): qty 3

## 2015-01-22 MED ORDER — WARFARIN SODIUM 2.5 MG PO TABS
2.5000 mg | ORAL_TABLET | Freq: Once | ORAL | Status: AC
  Administered 2015-01-22: 2.5 mg via ORAL
  Filled 2015-01-22: qty 1

## 2015-01-22 MED ORDER — TORSEMIDE 20 MG PO TABS: 40.0000 mg | ORAL_TABLET | Freq: Every day | ORAL | Status: DC

## 2015-01-22 NOTE — Progress Notes (Signed)
6 Days Post-Op Procedure(s) (LRB): SUBXYPHOID PERICARDIAL WINDOW (N/A) TRANSESOPHAGEAL ECHOCARDIOGRAM (TEE) (N/A) Subjective: Patient examined and medical record reviewed, most recent chest x-ray reviewed independently She is resting comfortably in bed The subxiphoid incision for pericardial window is healing The drain suture will be removed tomorrow-1 week postop No evidence of recurrent pericardial effusion 6 days postop  Objective: Vital signs in last 24 hours: Temp:  [98.2 F (36.8 C)-98.9 F (37.2 C)] 98.9 F (37.2 C) (12/27 0500) Pulse Rate:  [60-71] 60 (12/27 0839) Cardiac Rhythm:  [-] Ventricular paced (12/27 0713) Resp:  [18-20] 18 (12/27 0839) BP: (116-119)/(46-52) 116/51 mmHg (12/27 0500) SpO2:  [90 %-97 %] 90 % (12/27 0839) Weight:  [158 lb 8.2 oz (71.9 kg)] 158 lb 8.2 oz (71.9 kg) (12/27 0500)  Hemodynamic parameters for last 24 hours:   atrial fibrillation  Intake/Output from previous day: 12/26 0701 - 12/27 0700 In: 1259.1 [P.O.:960; I.V.:249.1; IV Piggyback:50] Out: 1202 [Urine:1200; Stool:2] Intake/Output this shift: Total I/O In: 240 [P.O.:240] Out: -   Heart rate regular Neuro intact except for hard of hearing Incision well-healed Abdomen soft Lungs clear  Lab Results:  Recent Labs  01/21/15 0245 01/22/15 0602  WBC 6.0 5.1  HGB 11.4* 11.4*  HCT 36.3 36.1  PLT 251 280   BMET:  Recent Labs  01/21/15 0245 01/22/15 0602  NA 139 138  K 5.2* 4.9  CL 101 99*  CO2 28 31  GLUCOSE 104* 184*  BUN 22* 28*  CREATININE 1.02* 1.49*  CALCIUM 9.2 9.7    PT/INR:  Recent Labs  01/22/15 0602  LABPROT 23.8*  INR 2.14*   ABG    Component Value Date/Time   PHART 7.447 01/17/2015 0319   HCO3 31.3* 01/17/2015 0319   TCO2 32.7 01/17/2015 0319   O2SAT 93.1 01/17/2015 0319   CBG (last 3)  No results for input(s): GLUCAP in the last 72 hours.  Assessment/Plan: S/P Procedure(s) (LRB): SUBXYPHOID PERICARDIAL WINDOW (N/A) TRANSESOPHAGEAL  ECHOCARDIOGRAM (TEE) (N/A) We'll follow patient in office as outpatient.   LOS: 7 days    Tharon Aquas Trigt III 01/22/2015

## 2015-01-22 NOTE — Progress Notes (Addendum)
SUBJECTIVE:  Complains of cough  OBJECTIVE:   Vitals:   Filed Vitals:   01/21/15 2006 01/21/15 2036 01/22/15 0500 01/22/15 0839  BP: 119/46  116/51   Pulse: 65  71 60  Temp: 98.7 F (37.1 C)  98.9 F (37.2 C)   TempSrc: Oral  Oral   Resp: 18  18 18   Height:      Weight:   158 lb 8.2 oz (71.9 kg)   SpO2: 96% 97% 96% 90%   I&O's:   Intake/Output Summary (Last 24 hours) at 01/22/15 0845 Last data filed at 01/22/15 0400  Gross per 24 hour  Intake 1259.1 ml  Output   1202 ml  Net   57.1 ml   TELEMETRY: Reviewed telemetry pt in NSR:     PHYSICAL EXAM General: Well developed, well nourished, in no acute distress Head: Eyes PERRLA, No xanthomas.   Normal cephalic and atramatic  Lungs:   scattered expiratory wheezes Heart:   HRRR S1 S2 Pulses are 2+ & equal. Abdomen: Bowel sounds are positive, abdomen soft and non-tender without masses  Extremities:   No clubbing, cyanosis or edema.  DP +1 Neuro: Alert and oriented X 3. Psych:  Good affect, responds appropriately   LABS: Basic Metabolic Panel:  Recent Labs  01/21/15 0245 01/22/15 0602  NA 139 138  K 5.2* 4.9  CL 101 99*  CO2 28 31  GLUCOSE 104* 184*  BUN 22* 28*  CREATININE 1.02* 1.49*  CALCIUM 9.2 9.7   Liver Function Tests: No results for input(s): AST, ALT, ALKPHOS, BILITOT, PROT, ALBUMIN in the last 72 hours. No results for input(s): LIPASE, AMYLASE in the last 72 hours. CBC:  Recent Labs  01/21/15 0245 01/22/15 0602  WBC 6.0 5.1  HGB 11.4* 11.4*  HCT 36.3 36.1  MCV 91.9 90.5  PLT 251 280   Cardiac Enzymes: No results for input(s): CKTOTAL, CKMB, CKMBINDEX, TROPONINI in the last 72 hours. BNP: Invalid input(s): POCBNP D-Dimer: No results for input(s): DDIMER in the last 72 hours. Hemoglobin A1C: No results for input(s): HGBA1C in the last 72 hours. Fasting Lipid Panel: No results for input(s): CHOL, HDL, LDLCALC, TRIG, CHOLHDL, LDLDIRECT in the last 72 hours. Thyroid Function Tests: No  results for input(s): TSH, T4TOTAL, T3FREE, THYROIDAB in the last 72 hours.  Invalid input(s): FREET3 Anemia Panel: No results for input(s): VITAMINB12, FOLATE, FERRITIN, TIBC, IRON, RETICCTPCT in the last 72 hours. Coag Panel:   Lab Results  Component Value Date   INR 2.14* 01/22/2015   INR 1.84* 01/21/2015   INR 1.60* 01/20/2015    RADIOLOGY: Dg Chest 2 View  01/19/2015  CLINICAL DATA:  Pericardial effusion. EXAM: CHEST  2 VIEW COMPARISON:  01/18/2015 FINDINGS: Dual lead cardiac pacemaker with battery within the right chest wall is stable. Inferior approach mediastinal or pericardial drain has been removed. Cardiomediastinal silhouette is stably enlarged. Linear opacities in bilateral lung bases may represent atelectasis. There is persistent mild interstitial pulmonary edema. Small bilateral pleural effusions cannot be excluded. No evidence of pneumothorax. Osseous structures are without acute abnormality. Soft tissues are grossly normal. IMPRESSION: Status post removal of mediastinal or pericardial drain. No evidence of pneumothorax or pneumomediastinum. Stably enlarged cardiac silhouette with mild pulmonary edema and bibasilar atelectasis. Small bilateral pleural effusions cannot be excluded. Electronically Signed   By: Fidela Salisbury M.D.   On: 01/19/2015 09:25   Dg Chest 2 View  01/15/2015  CLINICAL DATA:  Preoperative examination. EXAM: CHEST  2 VIEW COMPARISON:  07/17/2014; 01/13/2014; 06/11/2012; 07/01/2011; chest CT - 07/04/2011 FINDINGS: Grossly unchanged marked enlargement of the cardiac silhouette and mediastinal contours with atherosclerotic plaque within thoracic aorta. Stable positioning of support apparatus. There is chronic atelectasis/collapse of the right middle lobe. Chronic pulmonary venous congestion without frank evidence of edema. There is persistent blunting of the bilobed costophrenic angles without definite pleural effusion. No pneumothorax. Unchanged bones.  IMPRESSION: 1. Similar findings of marked cardiomegaly and pulmonary venous congestion without definite acute cardiopulmonary disease. 2. Chronic partial atelectasis/collapse of the right middle lobe, similar compared to the 06/2011 examinations. Electronically Signed   By: Sandi Mariscal M.D.   On: 01/15/2015 16:13   Dg Chest Port 1 View  01/21/2015  CLINICAL DATA:  Pneumonia. EXAM: PORTABLE CHEST 1 VIEW COMPARISON:  01/19/2015 and 01/18/2015. FINDINGS: 1245 hours. Right subclavian pacemaker leads appear unchanged within the right atrium and right ventricle. The heart is enlarged. There is aortic atherosclerosis, vascular congestion and bilateral pleural effusions. Associated bibasilar atelectasis is similar to the most recent examination. There is no pneumothorax. The bones appear unchanged. IMPRESSION: No significant change in cardiomegaly, vascular congestion, pleural effusions and bibasilar atelectasis. Electronically Signed   By: Richardean Sale M.D.   On: 01/21/2015 13:20   Dg Chest Port 1 View  01/18/2015  CLINICAL DATA:  Chest tube placement EXAM: PORTABLE CHEST 1 VIEW COMPARISON:  12/22/6 FINDINGS: There is a right chest wall pacer device with lead in the right atrial appendage and right ventricle. The heart size is enlarged. There is aortic atherosclerosis. Bilateral pleural effusions and interstitial edema is identified compatible with CHF. Chest tube overlying the left lower pericardial space noted. IMPRESSION: 1. Stable CHF. 2. Aortic atherosclerosis. Electronically Signed   By: Kerby Moors M.D.   On: 01/18/2015 08:05   Dg Chest Port 1 View  01/17/2015  CLINICAL DATA:  Chest tube, pericardial effusion. EXAM: PORTABLE CHEST 1 VIEW COMPARISON:  Chest radiograph from one day prior. FINDINGS: Stable configuration of 3 lead right subclavian pacemaker and av surgical drain terminating over the left lower pericardial space. Stable cardiomediastinal silhouette with moderate cardiomegaly. No  definite pneumothorax. Trace left pleural effusion, stable. Mild pulmonary edema appears slightly decreased. IMPRESSION: 1. Mild congestive heart failure, slightly improved. 2. Stable trace bilateral pleural effusions. Electronically Signed   By: Ilona Sorrel M.D.   On: 01/17/2015 07:44   Dg Chest Port 1 View  01/16/2015  CLINICAL DATA:  Postop thoracic surgery. Chest tube in place. Subxiphoid pericardial window procedure. Evaluate for pneumothorax. EXAM: PORTABLE CHEST 1 VIEW COMPARISON:  01/15/2015 FINDINGS: Evidence for a surgical drain in the left lower chest. Again noted is enlargement of the cardiac silhouette with prominent perihilar densities and peribronchial thickening. Right cardiac pacemaker is again noted and leads appear to be grossly stable. Negative for a pneumothorax. IMPRESSION: Evidence for a surgical drain in the left lower chest. Negative for pneumothorax. Enlarged central vascular structures with peribronchial thickening. Findings are suggestive for pulmonary edema. Electronically Signed   By: Markus Daft M.D.   On: 01/16/2015 10:35    ASSESSMENT AND PLAN  1.Pericardial effusion s/p pericardial window. Cultures negative, pathology/cytology nondiagnostic. 2. Acute on chronic diastolic CHF: diuresed [id number] since admission.Appears euvolemic. On PO diuretics, close to usual home dose. Weight down 3 lbs since yesterday and creatinine has bumped to 1.4.  Decrease Demadex to home dose of 40mg  daily and recheck BMET in am.   3. Chronic anticoagulation with Coumadin: Coumadin had been on hold, reversed with Vit K. Now  back on coumadin per pharmacy since 12/24. INR therapeutic. D/C Heparin 4. Chronic atrial fibrillation rate controlled on digoxin and Verapamil. V paced  5. Tachybrady syndrome s/p PPM 12/2013 6. Acute bronchitis versus early pneumonia? No fever and WBC normal, but may be somewhat immunosuppressed (recent shingles). Never smoker. Yesterday given one dose of solumedrol,  reluctant to prescribe more steroids with recent shingles. Elderly and frail, antibiotics started yesterday. Continue Zosyn and stop Vanc.  Her lungs have wheezes today and coughing up sputum but clear.  Check CXR today. 7. Hyperkalemia - unexpected (hemolysis?). Now improved.  Hold supplements.   Sueanne Margarita, MD  01/22/2015  8:45 AM

## 2015-01-22 NOTE — Progress Notes (Signed)
PCCM PROGRESS NOTE  ADMISSION DATE: 01/15/2015 CONSULT DATE: 01/21/2015 REFERRING PROVIDER: Cr. Croitoru  CC: Wheezing  SUBJECTIVE: Still has cough.  Denies chest pain.  VITALS SIGNS: BP 116/51 mmHg  Pulse 60  Temp(Src) 98.9 F (37.2 C) (Oral)  Resp 18  Ht 5\' 5"  (1.651 m)  Wt 158 lb 8.2 oz (71.9 kg)  BMI 26.38 kg/m2  SpO2 90%  INTAKE/OUTPUT: I/O last 3 completed shifts: In: 1825.4 [P.O.:1320; I.V.:455.4; IV Piggyback:50] Out: 1952 [Urine:1950; Stool:2]  General: pleasant HEENT: clear nasal discharge Cardiac: regular, 2/6 SM Chest: no wheeze, decreased BS at bases Abd: soft, nontender Ext: 1+ edema Neuro: normal strength Skin: no rashes   CMP Latest Ref Rng 01/22/2015 01/21/2015 01/19/2015  Glucose 65 - 99 mg/dL 184(H) 104(H) 186(H)  BUN 6 - 20 mg/dL 28(H) 22(H) 24(H)  Creatinine 0.44 - 1.00 mg/dL 1.49(H) 1.02(H) 1.19(H)  Sodium 135 - 145 mmol/L 138 139 135  Potassium 3.5 - 5.1 mmol/L 4.9 5.2(H) 4.4  Chloride 101 - 111 mmol/L 99(L) 101 98(L)  CO2 22 - 32 mmol/L 31 28 26   Calcium 8.9 - 10.3 mg/dL 9.7 9.2 9.1  Total Protein 6.5 - 8.1 g/dL - - -  Total Bilirubin 0.3 - 1.2 mg/dL - - -  Alkaline Phos 38 - 126 U/L - - -  AST 15 - 41 U/L - - -  ALT 14 - 54 U/L - - -     CBC Latest Ref Rng 01/22/2015 01/21/2015 01/19/2015  WBC 4.0 - 10.5 K/uL 5.1 6.0 10.1  Hemoglobin 12.0 - 15.0 g/dL 11.4(L) 11.4(L) 12.7  Hematocrit 36.0 - 46.0 % 36.1 36.3 39.4  Platelets 150 - 400 K/uL 280 251 270     Dg Chest 2 View  01/22/2015  CLINICAL DATA:  Productive cough EXAM: CHEST  2 VIEW COMPARISON:  01/21/2015 FINDINGS: Patchy right middle lobe opacity, atelectasis versus pneumonia. Mild bibasilar opacities, likely atelectasis. Small bilateral pleural effusions. Pulmonary vascular congestion without frank interstitial edema. Cardiomegaly.  Right subclavian pacemaker. Visualized osseous structures are within normal limits. IMPRESSION: Patchy right middle lobe opacity, atelectasis  versus pneumonia. Mild bibasilar opacities, likely atelectasis. Pulmonary vascular congestion without frank interstitial edema. Small bilateral pleural effusions. Electronically Signed   By: Julian Hy M.D.   On: 01/22/2015 09:53   Dg Chest Port 1 View  01/21/2015  CLINICAL DATA:  Pneumonia. EXAM: PORTABLE CHEST 1 VIEW COMPARISON:  01/19/2015 and 01/18/2015. FINDINGS: 1245 hours. Right subclavian pacemaker leads appear unchanged within the right atrium and right ventricle. The heart is enlarged. There is aortic atherosclerosis, vascular congestion and bilateral pleural effusions. Associated bibasilar atelectasis is similar to the most recent examination. There is no pneumothorax. The bones appear unchanged. IMPRESSION: No significant change in cardiomegaly, vascular congestion, pleural effusions and bibasilar atelectasis. Electronically Signed   By: Richardean Sale M.D.   On: 01/21/2015 13:20     CULTURES: 12/21 Pericardial fluid >>  ANTIBIOTICS: 12/26 Vancomycin >> 12/27 12/26 Zosyn >>  STUDIES: 12/20 Echo >> mild LVH, EF 55 to 60%, mild AS, mild/mod AR, mild MR, mod TR, PAS 66 mmHg, large pericardial effusion 12/21 Pericardial fluid >> 1200 ml, WBC 129 (94% M), pathology negative for malignancy  EVENTS: 12/20 admit, TCTS consulted 12/21 Pericardial window 12/23 Pericardial drain d/c'ed 12/24 Cough, wheezing develop  DISCUSSION: 81 yo female admitted for pericardial window with progressive pericardial effusion.  She subsequently developed cough, wheezing.  Concern for HCAP with acute asthmatic bronchitis, acute on chronic diastolic CHF, and upper airway cough with  post-nasal drip.  ASSESSMENT/PLAN:  HCAP. Plan:  - day 2 of Abx  >> might be able to change to po Abx soon - f/u CXR intermittently  Acute asthmatic bronchitis. Plan: - change albuterol to q6h while awake and q4h prn - add pulmicort 12/27 - if no improvement in wheezing, then add systemic steroids - bronchial  hygiene  Upper airway cough with post nasal drip. Plan: - flonase  Acute on chronic diastolic CHF. Hx of A fib. Pericardial effusion s/p window. Plan: - per cardiology and TCTS  Updated pt's family at bedside.  Chesley Mires, MD [facility] Pulmonary/Critical Care 01/22/2015, 11:48 AM Pager:  [phone] After 3pm call: [phone]

## 2015-01-22 NOTE — Progress Notes (Signed)
ANTICOAGULATION CONSULT NOTE - Follow Up Consult  Pharmacy Consult for heparin Indication: atrial fibrillation   Labs:  Recent Labs  01/19/15 0949 01/20/15 0225  01/21/15 0245 01/21/15 1030 01/21/15 2059 01/22/15 0602  HGB  --   --   --  11.4*  --   --  11.4*  HCT  --   --   --  36.3  --   --  36.1  PLT  --   --   --  251  --   --  280  LABPROT  --  19.1*  --   --  21.2*  --  23.8*  INR  --  1.60*  --   --  1.84*  --  2.14*  HEPARINUNFRC  --   --   < > 0.28* 0.21* 0.58 0.77*  CREATININE 1.19*  --   --  1.02*  --   --  1.49*  < > = values in this interval not displayed.   Assessment: 81yo female now above goal on heparin after one level at goal.  Goal of Therapy:  Heparin level 0.3-0.7 units/ml   Plan:  Will decrease heparin gtt slightly to 1100 units/hr and check level in [place], [name], BCPS  01/22/2015,7:00 AM

## 2015-01-22 NOTE — Care Management Note (Signed)
Case Management Note Previous CM note initiated by [facility] RN, CM   Patient Details  Name: [name] MRN: [id number] Date of Birth: 1923-12-11  Subjective/Objective:  Pt lives alone but has arranged to go home with dtr who will provide 24/7 assistance when she is medically stable for discharge.                     Expected Discharge Date:                  Expected Discharge Plan:  Home/Self Care  In-House Referral:     Discharge planning Services  CM Consult  Post Acute Care Choice:    Choice offered to:     DME Arranged:    DME Agency:     HH Arranged:    HH Agency:     Status of Service:  In process, will continue to follow  Medicare Important Message Given:  Yes Date Medicare IM Given:    Medicare IM give by:    Date Additional Medicare IM Given:    Additional Medicare Important Message give by:     If discussed at [place] of Stay Meetings, dates discussed:  01/22/15  Additional Comments:  01/22/15- per PT notes- no recommendations for d/c needs or follow up - plan to d/c home with daughter, [facility] referral- and to follow pt upon discharge.  Dawayne Patricia, RN 01/22/2015, 2:23 PM

## 2015-01-22 NOTE — Evaluation (Signed)
Physical Therapy Evaluation Patient Details Name: [name] MRN: [id number] DOB: 07-Jan-1924 Today's Date: 01/22/2015   History of Present Illness  81 yo F admitted SOB and DOE. Has a history of pericardial effusion (dx 2012), perm afib w/ MDT PPM 12/2013 for slow VR, chronic coumadin, CVA, GIB 2nd AVMs. Underwent pericardial window and has HCAP.   Clinical Impression  Pt admitted with above diagnosis. Pt currently with functional limitations due to the deficits listed below (see PT Problem List). Pt will benefit from PT to increase her independence and safety with mobility to allow discharge to her duaghter's house.  Pt does not feel she is at her baseline level of function yet and I recommend she use her RW from a previous admission upon DC home to increase safety.  Encouraged pt to ambulate with nursing staff daily to increase strength.      Follow Up Recommendations No PT follow up;Supervision for mobility/OOB    Equipment Recommendations  None recommended by PT    Recommendations for Other Services       Precautions / Restrictions Precautions Precautions: Fall Restrictions Weight Bearing Restrictions: No      Mobility  Bed Mobility Overal bed mobility: Independent                Transfers Overall transfer level: Needs assistance Equipment used: Rolling walker (2 wheeled) Transfers: Sit to/from Stand Sit to Stand: Supervision         General transfer comment: Pt transferred from bed and commode without difficulty   Ambulation/Gait Ambulation/Gait assistance: Supervision Ambulation Distance (Feet): 180 Feet Assistive device: Rolling walker (2 wheeled) Gait Pattern/deviations: Step-through pattern   Gait velocity interpretation: at or above normal speed for age/gender General Gait Details: no balance losses. Pt felt more secure using RW  Stairs            Wheelchair Mobility    Modified Rankin (Stroke Patients Only)       Balance                                              Pertinent Vitals/Pain Pain Assessment: No/denies pain  Oxygen sats on RA while ambulating decreased to 82%.  Quickly increased to 92% on RA upon sitting down and resting.      Home Living Family/patient expects to be discharged to:: Private residence Living Arrangements: Alone Available Help at Discharge: Family;Available 24 hours/day Type of Home: House           Additional Comments: Pt plans to stay at her daughters hourse upon DC    Prior Function Level of Independence: Independent               Hand Dominance   Dominant Hand: Right    Extremity/Trunk Assessment   Upper Extremity Assessment: Overall WFL for tasks assessed           Lower Extremity Assessment: Overall WFL for tasks assessed      Cervical / Trunk Assessment: Normal  Communication   Communication: HOH  Cognition Arousal/Alertness: Awake/alert Behavior During Therapy: WFL for tasks assessed/performed Overall Cognitive Status: Within Functional Limits for tasks assessed                      General Comments General comments (skin integrity, edema, etc.): Family present in room during evaluation.  MD entered to talk  with pt family at end of session    Exercises        Assessment/Plan    PT Assessment Patient needs continued PT services  PT Diagnosis Difficulty walking   PT Problem List Decreased activity tolerance;Decreased mobility;Decreased knowledge of use of DME;Cardiopulmonary status limiting activity  PT Treatment Interventions DME instruction;Gait training;Therapeutic activities;Therapeutic exercise;Patient/family education   PT Goals (Current goals can be found in the Care Plan section) Acute Rehab PT Goals Patient Stated Goal: pt wants to return home PT Goal Formulation: With patient Time For Goal Achievement: 01/29/15 Potential to Achieve Goals: Good    Frequency Min 3X/week   Barriers to discharge         Co-evaluation               End of Session Equipment Utilized During Treatment: Gait belt Activity Tolerance: Patient tolerated treatment well Patient left: in bed;with call bell/phone within reach;with family/visitor present;Other (comment) (MD present) Nurse Communication: Mobility status (Spoke with NT as RN was busy with another pt)         Time: [id number] PT Time Calculation (min) (ACUTE ONLY): 25 min   Charges:   PT Evaluation $Initial PT Evaluation Tier I: 1 Procedure PT Treatments $Gait Training: 8-22 mins   PT G Codes:        Melvern Banker 01/22/2015, 12:19 PM  Lavonia Dana, PT  [phone] 01/22/2015

## 2015-01-22 NOTE — Consult Note (Signed)
   [facility] CM Inpatient Consult   01/22/2015  [name] [birth year] [id number]   Patient evaluated for [place] Management services. Came to bedside to discuss and offer [facility] Care Management. Patient is agreeable to [place] Management and consents were signed. She states she will go stay with her daughter for a period of time post hospital discharge. States her daughter lives off of [address] in [place], but she does not know the physical address. Explained to patient that she will receive post hospital transition of care calls and will be evaluated for monthly home visits. Explained that [facility] will not interfere or replace services provided by home health if she should have home health services at discharge. [facility] Care Management packet and contact information left at bedside. Inpatient RNCM aware that [facility] to follow.  Confirmed best contact number is Melinda Crutch, patient's daughter at [phone]. Attempted to contact [name] to confirm her address. HIPPA compliant voicemail left and request for call back.  Will request for patient to be assigned to [place] [facility] in the meantime.   Marthenia Rolling, MSN-Ed, RN,BSN [facility] Liaison [phone]

## 2015-01-22 NOTE — Care Management Important Message (Signed)
Important Message  Patient Details  Name: [name] MRN: [id number] Date of Birth: 08/05/1923   Medicare Important Message Given:  Yes    [name] [name] 01/22/2015, 1:07 PM

## 2015-01-22 NOTE — Progress Notes (Signed)
ANTICOAGULATION CONSULT NOTE - Initial Consult  Pharmacy Consult for heparin/coumadin/vanc/zosyn Indication: atrial fibrillation/PNA  Allergies  Allergen Reactions  . Iodinated Diagnostic Agents Hives    HIVES 15MIN S/P IV CONTRAST INJECTION,WILL NEED 13 HR PREP FOR FUTURE INJECTIONS, ok s/p 50mg  po benadryl//a.calhoun  . Anti-Inflammatory Enzyme [Nutritional Supplements]     Retains fluids and headaches  . Arthrotec [Diclofenac-Misoprostol] Other (See Comments)    unknown  . Biaxin [Clarithromycin] Other (See Comments)    unknown  . Plavix [Clopidogrel Bisulfate] Other (See Comments)    unknown  . Spironolactone     Hair loss    Patient Measurements: Height: 5\' 5"  (165.1 cm) Weight: 158 lb 8.2 oz (71.9 kg) IBW/kg (Calculated) : 57 Heparin Dosing Weight: 72 kg  Vital Signs: Temp: 98.9 F (37.2 C) (12/27 0500) Temp Source: Oral (12/27 0500) BP: 116/51 mmHg (12/27 0500) Pulse Rate: 60 (12/27 0839)  Labs:  Recent Labs  01/19/15 0949 01/20/15 0225  01/21/15 0245 01/21/15 1030 01/21/15 2059 01/22/15 0602  HGB  --   --   --  11.4*  --   --  11.4*  HCT  --   --   --  36.3  --   --  36.1  PLT  --   --   --  251  --   --  280  LABPROT  --  19.1*  --   --  21.2*  --  23.8*  INR  --  1.60*  --   --  1.84*  --  2.14*  HEPARINUNFRC  --   --   < > 0.28* 0.21* 0.58 0.77*  CREATININE 1.19*  --   --  1.02*  --   --  1.49*  < > = values in this interval not displayed.  Estimated Creatinine Clearance: 25.5 mL/min (by C-G formula based on Cr of 1.49).   Medical History: Past Medical History  Diagnosis Date  . Pericardial effusion     Chronic. Last echo in April of 2012.   . Atrial fibrillation (HCC)     Chronic  . Chronic anticoagulation     on Coumadin  . History of GI bleed     once treated with Fe infusion  . HTN (hypertension)   . Pacemaker     Medtronic, placed for tachybrady.   . Valvular heart disease   . Obesity   . LVH (left ventricular hypertrophy)   .  Thrombophlebitis 1967    associated with pregnancy  . Diverticulosis   . Heart murmur   . Osteoporosis   . Hx of echocardiogram 07/28/2011    EF>55% small to moderate and is mostly located posteriorly. there is no overt evidence of tamponade although subtle diastolic collapse of the right ventricle.  . Sleep apnea     mild-no cpap  . History of blood transfusion "several times"  . CHF (congestive heart failure) ([place])   . Pneumonia ~ 1986  . Positive TB test   . Anemia   . Iron deficiency anemia     "I've had an iron transfusion"  . Stroke [facility]) April 2013    right frontal  . Arthritis   . Tachycardia-bradycardia syndrome [facility])     s/p Medtronic Sensia  model number [id number], serial number [id number] H 12/2013  . Acute on chronic diastolic CHF (congestive heart failure), NYHA class 3 ([place]) 01/15/2015  . History of shingles    Assessment:  81 y/o F with SOB, pericardial effusion. Pharmacy consulted to dose heparin for  atrial fibrillation. Pharmacy also consulted to dose warfarin from PTA. Pt is s/p vit K and has a sub-therapeutic INR. Heparin has been used for bridging. HL is slightly elevated this AM. INR is now in the therapeutic range again at 2.14. CCM has also added vanc/zosyn for possible HAP. MRSA PCR is neg. Scr up slightly to 1.49.   Goal of Therapy:  Heparin level 0.3-0.7 units/ml  Vanc trough = 15-20 Monitor platelets by anticoagulation protocol: Yes   Plan:   Cont Heparin gtt 1100 units/hr 8 hr HL Coumadin 2.5mg  PO x1 Daily HL, PT/INR, CBC Vanc 1g IV q24 Zosyn 3.375g IV q8 VT as needed Consider dc vanc and heparin  Onnie Boer, [name] Pager: [phone] 01/22/2015 8:45 AM

## 2015-01-23 LAB — CBC
HEMATOCRIT: 35.9 % — AB (ref 36.0–46.0)
HEMOGLOBIN: 11.6 g/dL — AB (ref 12.0–15.0)
MCH: 29.3 pg (ref 26.0–34.0)
MCHC: 32.3 g/dL (ref 30.0–36.0)
MCV: 90.7 fL (ref 78.0–100.0)
Platelets: 264 10*3/uL (ref 150–400)
RBC: 3.96 MIL/uL (ref 3.87–5.11)
RDW: 15.1 % (ref 11.5–15.5)
WBC: 6.7 10*3/uL (ref 4.0–10.5)

## 2015-01-23 LAB — PROTIME-INR
INR: 2.17 — AB (ref 0.00–1.49)
Prothrombin Time: 24 seconds — ABNORMAL HIGH (ref 11.6–15.2)

## 2015-01-23 LAB — PROCALCITONIN: PROCALCITONIN: 0.14 ng/mL

## 2015-01-23 MED ORDER — TORSEMIDE 20 MG PO TABS
60.0000 mg | ORAL_TABLET | Freq: Every day | ORAL | Status: DC
  Administered 2015-01-23 – 2015-01-26 (×4): 60 mg via ORAL
  Filled 2015-01-23 (×4): qty 3

## 2015-01-23 MED ORDER — METHYLPREDNISOLONE SODIUM SUCC 40 MG IJ SOLR
40.0000 mg | Freq: Four times a day (QID) | INTRAMUSCULAR | Status: DC
  Administered 2015-01-23 – 2015-01-25 (×9): 40 mg via INTRAVENOUS
  Filled 2015-01-23 (×9): qty 1

## 2015-01-23 MED ORDER — ARFORMOTEROL TARTRATE 15 MCG/2ML IN NEBU
15.0000 ug | INHALATION_SOLUTION | Freq: Two times a day (BID) | RESPIRATORY_TRACT | Status: DC
  Administered 2015-01-23 – 2015-01-26 (×7): 15 ug via RESPIRATORY_TRACT
  Filled 2015-01-23 (×7): qty 2

## 2015-01-23 MED ORDER — ALBUTEROL SULFATE (2.5 MG/3ML) 0.083% IN NEBU: 2.5000 mg | INHALATION_SOLUTION | RESPIRATORY_TRACT | Status: DC | PRN

## 2015-01-23 MED ORDER — WARFARIN SODIUM 5 MG PO TABS
5.0000 mg | ORAL_TABLET | ORAL | Status: DC
  Administered 2015-01-23 – 2015-01-25 (×2): 5 mg via ORAL
  Filled 2015-01-23 (×3): qty 1

## 2015-01-23 MED ORDER — WARFARIN SODIUM 2.5 MG PO TABS
2.5000 mg | ORAL_TABLET | ORAL | Status: DC
  Administered 2015-01-24: 2.5 mg via ORAL

## 2015-01-23 NOTE — Plan of Care (Signed)
Problem: Activity: Goal: Risk for activity intolerance will decrease Outcome: Progressing Pt ambulated 500 ft in hallway with front wheel walker. Tolerated well.  No complaints of SOB, did de-stat to low 80s without 02. On 2L sats went up to mid 90s.

## 2015-01-23 NOTE — Progress Notes (Signed)
PCCM PROGRESS NOTE  ADMISSION DATE: 01/15/2015 CONSULT DATE: 01/21/2015 REFERRING PROVIDER: Cr. Croitoru  CC: Wheezing  SUBJECTIVE: Still has cough and wheeze.  Oxygen desat with ambulation today.  VITALS SIGNS: BP 114/45 mmHg  Pulse 62  Temp(Src) 98.6 F (37 C) (Oral)  Resp 16  Ht 5\' 5"  (1.651 m)  Wt 150 lb 12.8 oz (68.402 kg)  BMI 25.09 kg/m2  SpO2 89%  INTAKE/OUTPUT: I/O last 3 completed shifts: In: 1452.5 [P.O.:1200; I.V.:152.5; IV Piggyback:100] Out: 850 [Urine:850]  General: pleasant HEENT: clear nasal discharge Cardiac: regular, 2/6 SM Chest: b/l expiratory wheeze Abd: soft, nontender Ext: 1+ edema Neuro: normal strength Skin: no rashes   CMP Latest Ref Rng 01/22/2015 01/21/2015 01/19/2015  Glucose 65 - 99 mg/dL 184(H) 104(H) 186(H)  BUN 6 - 20 mg/dL 28(H) 22(H) 24(H)  Creatinine 0.44 - 1.00 mg/dL 1.49(H) 1.02(H) 1.19(H)  Sodium 135 - 145 mmol/L 138 139 135  Potassium 3.5 - 5.1 mmol/L 4.9 5.2(H) 4.4  Chloride 101 - 111 mmol/L 99(L) 101 98(L)  CO2 22 - 32 mmol/L 31 28 26   Calcium 8.9 - 10.3 mg/dL 9.7 9.2 9.1  Total Protein 6.5 - 8.1 g/dL - - -  Total Bilirubin 0.3 - 1.2 mg/dL - - -  Alkaline Phos 38 - 126 U/L - - -  AST 15 - 41 U/L - - -  ALT 14 - 54 U/L - - -     CBC Latest Ref Rng 01/23/2015 01/22/2015 01/21/2015  WBC 4.0 - 10.5 K/uL 6.7 5.1 6.0  Hemoglobin 12.0 - 15.0 g/dL 11.6(L) 11.4(L) 11.4(L)  Hematocrit 36.0 - 46.0 % 35.9(L) 36.1 36.3  Platelets 150 - 400 K/uL 264 280 251     Dg Chest 2 View  01/22/2015  CLINICAL DATA:  Productive cough EXAM: CHEST  2 VIEW COMPARISON:  01/21/2015 FINDINGS: Patchy right middle lobe opacity, atelectasis versus pneumonia. Mild bibasilar opacities, likely atelectasis. Small bilateral pleural effusions. Pulmonary vascular congestion without frank interstitial edema. Cardiomegaly.  Right subclavian pacemaker. Visualized osseous structures are within normal limits. IMPRESSION: Patchy right middle lobe opacity,  atelectasis versus pneumonia. Mild bibasilar opacities, likely atelectasis. Pulmonary vascular congestion without frank interstitial edema. Small bilateral pleural effusions. Electronically Signed   By: Julian Hy M.D.   On: 01/22/2015 09:53   Dg Chest Port 1 View  01/21/2015  CLINICAL DATA:  Pneumonia. EXAM: PORTABLE CHEST 1 VIEW COMPARISON:  01/19/2015 and 01/18/2015. FINDINGS: 1245 hours. Right subclavian pacemaker leads appear unchanged within the right atrium and right ventricle. The heart is enlarged. There is aortic atherosclerosis, vascular congestion and bilateral pleural effusions. Associated bibasilar atelectasis is similar to the most recent examination. There is no pneumothorax. The bones appear unchanged. IMPRESSION: No significant change in cardiomegaly, vascular congestion, pleural effusions and bibasilar atelectasis. Electronically Signed   By: Richardean Sale M.D.   On: 01/21/2015 13:20     CULTURES: 12/21 Pericardial fluid >>  ANTIBIOTICS: 12/26 Vancomycin >> 12/27 12/26 Zosyn >>  STUDIES: 12/20 Echo >> mild LVH, EF 55 to 60%, mild AS, mild/mod AR, mild MR, mod TR, PAS 66 mmHg, large pericardial effusion 12/21 Pericardial fluid >> 1200 ml, WBC 129 (94% M), pathology negative for malignancy  EVENTS: 12/20 admit, TCTS consulted 12/21 Pericardial window 12/23 Pericardial drain d/c'ed 12/24 Cough, wheezing develop 12/28 Add solumedrol  DISCUSSION: 81 yo female admitted for pericardial window with progressive pericardial effusion.  She subsequently developed cough, wheezing.  Concern for HCAP with acute asthmatic bronchitis, acute on chronic diastolic CHF, and upper  airway cough with post-nasal drip.  ASSESSMENT/PLAN:  HCAP. Plan:  - day 3 of Abx  >>likely can change to augmentin 12/29 - f/u CXR intermittently  Acute asthmatic bronchitis. Plan: - add brovana 12/28 - continue pulmicort - prn albuterol - add solumedrol 12/28 - bronchial hygiene  Upper  airway cough with post nasal drip. Plan: - flonase  Acute on chronic diastolic CHF. Hx of A fib. Pericardial effusion s/p window. Plan: - per cardiology and TCTS  Acute hypoxic respiratory failure. Plan: - assessing for home oxygen   Updated pt's family at bedside.  Chesley Mires, MD [facility] Pulmonary/Critical Care 01/23/2015, 10:18 AM Pager:  [phone] After 3pm call: [phone]

## 2015-01-23 NOTE — Progress Notes (Signed)
Pt taken neb pt is stable at this time, family at bedside with her.

## 2015-01-23 NOTE — Progress Notes (Signed)
    SUBJECTIVE:  Still with SOB while ambulating.  Sats fell into the low 80s yesterday with ambulation.  No chest pain.    PHYSICAL EXAM Filed Vitals:   01/22/15 1500 01/22/15 1948 01/22/15 2128 01/23/15 0500  BP: 121/48  109/54 114/45  Pulse: 85  59 68  Temp: 98.2 F (36.8 C)  98 F (36.7 C) 98.6 F (37 C)  TempSrc: Oral  Oral Oral  Resp: 20   16  Height:      Weight:    150 lb 12.8 oz (68.402 kg)  SpO2: 95% 92% 90% 91%   General:  No distress Lungs:  Clear Heart:  Irregular Abdomen:  Positive bowel sounds, no rebound no guarding Extremities:  Moderate edema to the knees.    LABS: No results found for: TROPONINI Results for orders placed or performed during the hospital encounter of 01/15/15 (from the past 24 hour(s))  CBC     Status: Abnormal   Collection Time: 01/23/15  5:40 AM  Result Value Ref Range   WBC 6.7 4.0 - 10.5 K/uL   RBC 3.96 3.87 - 5.11 MIL/uL   Hemoglobin 11.6 (L) 12.0 - 15.0 g/dL   HCT 35.9 (L) 36.0 - 46.0 %   MCV 90.7 78.0 - 100.0 fL   MCH 29.3 26.0 - 34.0 pg   MCHC 32.3 30.0 - 36.0 g/dL   RDW 15.1 11.5 - 15.5 %   Platelets 264 150 - 400 K/uL  Protime-INR     Status: Abnormal   Collection Time: 01/23/15  5:40 AM  Result Value Ref Range   Prothrombin Time 24.0 (H) 11.6 - 15.2 seconds   INR 2.17 (H) 0.00 - 1.49  Procalcitonin     Status: None   Collection Time: 01/23/15  5:40 AM  Result Value Ref Range   Procalcitonin 0.14 ng/mL    Intake/Output Summary (Last 24 hours) at 01/23/15 0758 Last data filed at 01/22/15 1700  Gross per 24 hour  Intake   1010 ml  Output    750 ml  Net    260 ml    EKG:    ASSESSMENT AND PLAN:  HCAP/ACUTE ASTHMATIC BRONCHITIS:  Day number 3 of antibiotics.  Plan per CCM.    She has decreased sats with ambulation.  We will assess and arrange for home O2.  I anticipate that she can be discharged tomorrow.    ACUTE ON CHRONIC DIASTOLIC HF:   BNP is mildly elevated.    She does seem to have some excess volume.   However, her creat is up.  I will increase the diuretic slightly today and told her to keep her feet elevated.  She will wear compression stockings.    PERICARDIAL EFFUSION:  Status post window.    CKD:  Creat is up slightly yesterday.  Follow with repeat BMET in the AM.    ATRIAL FIB/HISTORY OF PE:   On warfarin.      Minus Breeding 01/23/2015 7:58 AM

## 2015-01-23 NOTE — Progress Notes (Addendum)
SATURATION QUALIFICATIONS: (This note is used to comply with regulatory documentation for home oxygen)  Patient Saturations on Room Air at Rest = 94%  Patient Saturations on Room Air while Ambulating = 80%  Patient Saturations on 2Liters of oxygen while Ambulating = 94  Please briefly explain why patient needs home oxygen: Ptdesats quickly while ambulating mid-low 80s. While seated without 02 sats go up to mid 90s within less than 1 minute. Pt has no complaints of SOB

## 2015-01-23 NOTE — Progress Notes (Signed)
ANTICOAGULATION CONSULT NOTE - Initial Consult  Pharmacy Consult for coumadin/zosyn Indication: atrial fibrillation/PNA  Allergies  Allergen Reactions  . Iodinated Diagnostic Agents Hives    HIVES 15MIN S/P IV CONTRAST INJECTION,WILL NEED 13 HR PREP FOR FUTURE INJECTIONS, ok s/p 50mg  po benadryl//a.calhoun  . Anti-Inflammatory Enzyme [Nutritional Supplements]     Retains fluids and headaches  . Arthrotec [Diclofenac-Misoprostol] Other (See Comments)    unknown  . Biaxin [Clarithromycin] Other (See Comments)    unknown  . Plavix [Clopidogrel Bisulfate] Other (See Comments)    unknown  . Spironolactone     Hair loss    Patient Measurements: Height: 5\' 5"  (165.1 cm) Weight: 150 lb 12.8 oz (68.402 kg) IBW/kg (Calculated) : 57 Heparin Dosing Weight: 72 kg  Vital Signs: Temp: 98.6 F (37 C) (12/28 0500) Temp Source: Oral (12/28 0500) BP: 114/45 mmHg (12/28 0500) Pulse Rate: 68 (12/28 0500)  Labs:  Recent Labs  01/21/15 0245 01/21/15 1030 01/21/15 2059 01/22/15 0602 01/23/15 0540  HGB 11.4*  --   --  11.4* 11.6*  HCT 36.3  --   --  36.1 35.9*  PLT 251  --   --  280 264  LABPROT  --  21.2*  --  23.8* 24.0*  INR  --  1.84*  --  2.14* 2.17*  HEPARINUNFRC 0.28* 0.21* 0.58 0.77*  --   CREATININE 1.02*  --   --  1.49*  --     Estimated Creatinine Clearance: 24.9 mL/min (by C-G formula based on Cr of 1.49).   Medical History: Past Medical History  Diagnosis Date  . Pericardial effusion     Chronic. Last echo in April of 2012.   . Atrial fibrillation (HCC)     Chronic  . Chronic anticoagulation     on Coumadin  . History of GI bleed     once treated with Fe infusion  . HTN (hypertension)   . Pacemaker     Medtronic, placed for tachybrady.   . Valvular heart disease   . Obesity   . LVH (left ventricular hypertrophy)   . Thrombophlebitis 1967    associated with pregnancy  . Diverticulosis   . Heart murmur   . Osteoporosis   . Hx of echocardiogram 07/28/2011     EF>55% small to moderate and is mostly located posteriorly. there is no overt evidence of tamponade although subtle diastolic collapse of the right ventricle.  . Sleep apnea     mild-no cpap  . History of blood transfusion "several times"  . CHF (congestive heart failure) ([place])   . Pneumonia ~ 1986  . Positive TB test   . Anemia   . Iron deficiency anemia     "I've had an iron transfusion"  . Stroke [facility]) April 2013    right frontal  . Arthritis   . Tachycardia-bradycardia syndrome [facility])     s/p Medtronic Sensia  model number [id number], serial number [id number] H 12/2013  . Acute on chronic diastolic CHF (congestive heart failure), NYHA class 3 ([place]) 01/15/2015  . History of shingles    Assessment:  81 y/o F with SOB, pericardial effusion. Pharmacy consulted to dose heparin for atrial fibrillation. Pharmacy also consulted to dose warfarin from PTA. Pt is s/p vit K and has a sub-therapeutic INR. INR is now in the therapeutic range again at 2.17. CCM has also added vanc/zosyn for possible HAP. MRSA PCR is neg. Scr up slightly to 1.49. Day 3 zosyn now. All cultures are neg. Pt remains  afebrile, WBC wnl. Note mentioned to consider changing abx to PO. Consider changing zosyn to Ceftin 500mg .   Goal of Therapy:  INR = 2-3   Plan:   Comadin 2.5mg  qday except 5mg  MWF Daily PT/INR Zosyn 3.375g IV q8 Consider changing zosyn to Ceftin 500mg  PO qday x 5 more days  Onnie Boer, [name] Pager: [phone] 01/23/2015 8:28 AM

## 2015-01-24 ENCOUNTER — Inpatient Hospital Stay (HOSPITAL_COMMUNITY): Payer: Medicare Other

## 2015-01-24 DIAGNOSIS — I319 Disease of pericardium, unspecified: Secondary | ICD-10-CM

## 2015-01-24 LAB — BASIC METABOLIC PANEL
ANION GAP: 11 (ref 5–15)
BUN: 33 mg/dL — ABNORMAL HIGH (ref 6–20)
CHLORIDE: 99 mmol/L — AB (ref 101–111)
CO2: 31 mmol/L (ref 22–32)
Calcium: 9.3 mg/dL (ref 8.9–10.3)
Creatinine, Ser: 1.45 mg/dL — ABNORMAL HIGH (ref 0.44–1.00)
GFR calc Af Amer: 36 mL/min — ABNORMAL LOW (ref >60)
GFR calc non Af Amer: 31 mL/min — ABNORMAL LOW (ref >60)
GLUCOSE: 165 mg/dL — AB (ref 65–99)
POTASSIUM: 4.2 mmol/L (ref 3.5–5.1)
Sodium: 141 mmol/L (ref 135–145)

## 2015-01-24 LAB — CBC
HEMATOCRIT: 37.2 % (ref 36.0–46.0)
HEMOGLOBIN: 11.7 g/dL — AB (ref 12.0–15.0)
MCH: 28.3 pg (ref 26.0–34.0)
MCHC: 31.5 g/dL (ref 30.0–36.0)
MCV: 89.9 fL (ref 78.0–100.0)
Platelets: 284 10*3/uL (ref 150–400)
RBC: 4.14 MIL/uL (ref 3.87–5.11)
RDW: 15 % (ref 11.5–15.5)
WBC: 4.5 10*3/uL (ref 4.0–10.5)

## 2015-01-24 LAB — PROTIME-INR
INR: 2.24 — ABNORMAL HIGH (ref 0.00–1.49)
Prothrombin Time: 24.6 seconds — ABNORMAL HIGH (ref 11.6–15.2)

## 2015-01-24 MED ORDER — MELATONIN 3 MG PO TABS
3.0000 mg | ORAL_TABLET | Freq: Every evening | ORAL | Status: DC | PRN
Start: 2015-01-24 — End: 2015-01-26
  Administered 2015-01-24 – 2015-01-25 (×2): 3 mg via ORAL
  Filled 2015-01-24 (×3): qty 1

## 2015-01-24 MED ORDER — NON FORMULARY: 3.0000 mg | Freq: Every evening | Status: DC | PRN

## 2015-01-24 NOTE — Plan of Care (Signed)
Problem: Skin Integrity: Goal: Risk for impaired skin integrity will decrease Outcome: Progressing No evidence of skin breakdown at this time.  Problem: Tissue Perfusion: Goal: Risk factors for ineffective tissue perfusion will decrease Outcome: Progressing TED hose applied as ordered and removed for 30 minute rest intervals. No skin breakdown noted. Pulses +1 bilateral feet and feet warm to touch.  Problem: Activity: Goal: Risk for activity intolerance will decrease Outcome: Progressing Patient ambulated in hallway 500 feet with oxygen and rolling Elazar Argabright. Tolerated well.

## 2015-01-24 NOTE — Progress Notes (Signed)
Patient Name: [name] Date of Encounter: 01/24/2015  SUBJECTIVE  She woke up with chest discomfort, placed oxygen and resolved. De-stat oxygen with ambulation. Will arrange home oxygen.   CURRENT MEDS . antiseptic oral rinse  7 mL Mouth Rinse BID  . arformoterol  15 mcg Nebulization BID  . budesonide (PULMICORT) nebulizer solution  0.25 mg Nebulization BID  . dextromethorphan-guaiFENesin  1 tablet Oral BID  . digoxin  0.125 mg Oral Daily  . fluticasone  2 spray Each Nare BID  . lidocaine  1 patch Transdermal Q24H  . methylPREDNISolone (SOLU-MEDROL) injection  40 mg Intravenous Q6H  . multivitamin with minerals  1 tablet Oral Daily  . piperacillin-tazobactam (ZOSYN)  IV  3.375 g Intravenous Q8H  . senna  1 tablet Oral QHS  . torsemide  60 mg Oral Daily  . verapamil  180 mg Oral QHS  . warfarin  2.5 mg Oral Once per day on Sun Tue Thu Sat  . warfarin  5 mg Oral Q M,W,F-1800  . Warfarin - Pharmacist Dosing Inpatient   Does not apply q1800    OBJECTIVE  Filed Vitals:   01/23/15 2022 01/23/15 2038 01/24/15 0510 01/24/15 0854  BP: 124/44  119/56   Pulse: 63  70 69  Temp: 99 F (37.2 C)  98.7 F (37.1 C)   TempSrc: Oral  Oral   Resp: 18  18 18   Height:      Weight:   159 lb 1.6 oz (72.167 kg)   SpO2: 95% 95% 93% 94%    Intake/Output Summary (Last 24 hours) at 01/24/15 0914 Last data filed at 01/24/15 0845  Gross per 24 hour  Intake    920 ml  Output    950 ml  Net    -30 ml   Filed Weights   01/22/15 0500 01/23/15 0500 01/24/15 0510  Weight: 158 lb 8.2 oz (71.9 kg) 150 lb 12.8 oz (68.402 kg) 159 lb 1.6 oz (72.167 kg)    PHYSICAL EXAM  General: Pleasant, NAD. Neuro: Alert and oriented X 3. Moves all extremities spontaneously. Psych: Normal affect. HEENT:  Normal  Neck: Supple without bruits or JVD. Lungs:  Resp regular and unlabored, Course breath sound with scattered wheezing.  Heart: irregular  no s3, s4. 2/6 systolic murmurs. Abdomen: Soft,  non-tender, non-distended, BS + x 4.  Extremities: No clubbing, cyanosis. Trace to 1+ BL LE edema. DP/PT/Radials 2+ and equal bilaterally.  Accessory Clinical Findings  CBC  Recent Labs  01/23/15 0540 01/24/15 0358  WBC 6.7 4.5  HGB 11.6* 11.7*  HCT 35.9* 37.2  MCV 90.7 89.9  PLT 264 [id number]   Basic Metabolic Panel  Recent Labs  01/22/15 0602 01/24/15 0358  NA 138 141  K 4.9 4.2  CL 99* 99*  CO2 31 31  GLUCOSE 184* 165*  BUN 28* 33*  CREATININE 1.49* 1.45*  CALCIUM 9.7 9.3    TELE  V paced  Radiology/Studies  Dg Chest 2 View  01/22/2015  CLINICAL DATA:  Productive cough EXAM: CHEST  2 VIEW COMPARISON:  01/21/2015 FINDINGS: Patchy right middle lobe opacity, atelectasis versus pneumonia. Mild bibasilar opacities, likely atelectasis. Small bilateral pleural effusions. Pulmonary vascular congestion without frank interstitial edema. Cardiomegaly.  Right subclavian pacemaker. Visualized osseous structures are within normal limits. IMPRESSION: Patchy right middle lobe opacity, atelectasis versus pneumonia. Mild bibasilar opacities, likely atelectasis. Pulmonary vascular congestion without frank interstitial edema. Small bilateral pleural effusions. Electronically Signed   By: Henderson Newcomer.D.  On: 01/22/2015 09:53   Dg Chest 2 View  01/19/2015  CLINICAL DATA:  Pericardial effusion. EXAM: CHEST  2 VIEW COMPARISON:  01/18/2015 FINDINGS: Dual lead cardiac pacemaker with battery within the right chest wall is stable. Inferior approach mediastinal or pericardial drain has been removed. Cardiomediastinal silhouette is stably enlarged. Linear opacities in bilateral lung bases may represent atelectasis. There is persistent mild interstitial pulmonary edema. Small bilateral pleural effusions cannot be excluded. No evidence of pneumothorax. Osseous structures are without acute abnormality. Soft tissues are grossly normal. IMPRESSION: Status post removal of mediastinal or pericardial  drain. No evidence of pneumothorax or pneumomediastinum. Stably enlarged cardiac silhouette with mild pulmonary edema and bibasilar atelectasis. Small bilateral pleural effusions cannot be excluded. Electronically Signed   By: Fidela Salisbury M.D.   On: 01/19/2015 09:25   Dg Chest 2 View  01/15/2015  CLINICAL DATA:  Preoperative examination. EXAM: CHEST  2 VIEW COMPARISON:  07/17/2014; 01/13/2014; 06/11/2012; 07/01/2011; chest CT - 07/04/2011 FINDINGS: Grossly unchanged marked enlargement of the cardiac silhouette and mediastinal contours with atherosclerotic plaque within thoracic aorta. Stable positioning of support apparatus. There is chronic atelectasis/collapse of the right middle lobe. Chronic pulmonary venous congestion without frank evidence of edema. There is persistent blunting of the bilobed costophrenic angles without definite pleural effusion. No pneumothorax. Unchanged bones. IMPRESSION: 1. Similar findings of marked cardiomegaly and pulmonary venous congestion without definite acute cardiopulmonary disease. 2. Chronic partial atelectasis/collapse of the right middle lobe, similar compared to the 06/2011 examinations. Electronically Signed   By: Sandi Mariscal M.D.   On: 01/15/2015 16:13   Dg Chest Port 1 View  01/21/2015  CLINICAL DATA:  Pneumonia. EXAM: PORTABLE CHEST 1 VIEW COMPARISON:  01/19/2015 and 01/18/2015. FINDINGS: 1245 hours. Right subclavian pacemaker leads appear unchanged within the right atrium and right ventricle. The heart is enlarged. There is aortic atherosclerosis, vascular congestion and bilateral pleural effusions. Associated bibasilar atelectasis is similar to the most recent examination. There is no pneumothorax. The bones appear unchanged. IMPRESSION: No significant change in cardiomegaly, vascular congestion, pleural effusions and bibasilar atelectasis. Electronically Signed   By: Richardean Sale M.D.   On: 01/21/2015 13:20   Dg Chest Port 1 View  01/18/2015   CLINICAL DATA:  Chest tube placement EXAM: PORTABLE CHEST 1 VIEW COMPARISON:  12/22/6 FINDINGS: There is a right chest wall pacer device with lead in the right atrial appendage and right ventricle. The heart size is enlarged. There is aortic atherosclerosis. Bilateral pleural effusions and interstitial edema is identified compatible with CHF. Chest tube overlying the left lower pericardial space noted. IMPRESSION: 1. Stable CHF. 2. Aortic atherosclerosis. Electronically Signed   By: Kerby Moors M.D.   On: 01/18/2015 08:05   Dg Chest Port 1 View  01/17/2015  CLINICAL DATA:  Chest tube, pericardial effusion. EXAM: PORTABLE CHEST 1 VIEW COMPARISON:  Chest radiograph from one day prior. FINDINGS: Stable configuration of 3 lead right subclavian pacemaker and av surgical drain terminating over the left lower pericardial space. Stable cardiomediastinal silhouette with moderate cardiomegaly. No definite pneumothorax. Trace left pleural effusion, stable. Mild pulmonary edema appears slightly decreased. IMPRESSION: 1. Mild congestive heart failure, slightly improved. 2. Stable trace bilateral pleural effusions. Electronically Signed   By: Ilona Sorrel M.D.   On: 01/17/2015 07:44   Dg Chest Port 1 View  01/16/2015  CLINICAL DATA:  Postop thoracic surgery. Chest tube in place. Subxiphoid pericardial window procedure. Evaluate for pneumothorax. EXAM: PORTABLE CHEST 1 VIEW COMPARISON:  01/15/2015 FINDINGS: Evidence for  a surgical drain in the left lower chest. Again noted is enlargement of the cardiac silhouette with prominent perihilar densities and peribronchial thickening. Right cardiac pacemaker is again noted and leads appear to be grossly stable. Negative for a pneumothorax. IMPRESSION: Evidence for a surgical drain in the left lower chest. Negative for pneumothorax. Enlarged central vascular structures with peribronchial thickening. Findings are suggestive for pulmonary edema. Electronically Signed   By: Markus Daft M.D.   On: 01/16/2015 10:35    ASSESSMENT AND PLAN  1. Pericardial effusion, Large - S/p pericardial window. Will need outpatient echo as outpatient.   2. HCAP/Acute asthmatic bronchitis - PCCM following. On IV abx--> change to po per CCM. De-stat oxygen with ambulation. CM will arrange home oxygen. She is going to her daughter's house.   3. Afib/ hx of PE - Chronic anticoagulation - Coumadin, CHADS2VASC=7 - Dosage per pharmacy. She checks INR at home.  - Continue Digoxin 0.125mg  and verapamil CR 180mg   4.  Acute on chronic diastolic CHF (congestive heart failure), NYHA class 3 ([place]) - diuresed 1.167L so far. Continue Torsemide 60mg  qd. Not on supplemental K due to upper high limit initially, today K of 4.2. However was taking at home. MD to decide. Likely low dose.   5. AonCKD - Creatinine slightly improved to 1.45 on increased torsemide. Will need BMET in one week.    6. Acute hypoxic respiratory failure. - Home oxygen  Signed, Bhagat,Bhavinkumar PA-C Pager [phone]  History and all data above reviewed.  Patient examined.  I agree with the findings as above. Chest pain with ambulation similar to symptoms she had before the pericardial effusion was tapped.  Ambulated OK but sats fell with ambulation The patient exam reveals COR:RRR  ,  Lungs: Decreased breath sounds  ,  Abd: Positive bowel sounds, no rebound no guarding, Ext Decreased Edema  .  All available labs, radiology testing, previous records reviewed. Agree with documented assessment and plan. Pericardial effusion:  No discharge today.  We will follow CCM recommendations.  I will order a limited echo to make sure that there is no recurrent effusion.      Jeneen Rinks Janea Schwenn  10:15 AM  01/24/2015

## 2015-01-24 NOTE — Progress Notes (Signed)
Pt has temp 99.3 F, no complaints. Will continue to watch

## 2015-01-24 NOTE — Progress Notes (Signed)
PCCM PROGRESS NOTE  ADMISSION DATE: 01/15/2015 CONSULT DATE: 01/21/2015 REFERRING PROVIDER: Cr. Croitoru  CC: Wheezing  SUBJECTIVE: Episode of chest pain last night >> better after she put oxygen on.  Still has cough and chest congestion.  Wheezing better.  Doesn't feel like she is ready to d/c home yet.  VITALS SIGNS: BP 119/56 mmHg  Pulse 69  Temp(Src) 98.7 F (37.1 C) (Oral)  Resp 18  Ht 5\' 5"  (1.651 m)  Wt 159 lb 1.6 oz (72.167 kg)  BMI 26.48 kg/m2  SpO2 94%  INTAKE/OUTPUT: I/O last 3 completed shifts: In: 1020 [P.O.:720; IV Piggyback:300] Out: 1075 [Urine:1075]  General: pleasant HEENT: clear nasal discharge Cardiac: regular, 2/6 SM Chest: scattered rhonchi, no wheeze Abd: soft, nontender Ext: 1+ edema Neuro: normal strength Skin: no rashes   CMP Latest Ref Rng 01/24/2015 01/22/2015 01/21/2015  Glucose 65 - 99 mg/dL 165(H) 184(H) 104(H)  BUN 6 - 20 mg/dL 33(H) 28(H) 22(H)  Creatinine 0.44 - 1.00 mg/dL 1.45(H) 1.49(H) 1.02(H)  Sodium 135 - 145 mmol/L 141 138 139  Potassium 3.5 - 5.1 mmol/L 4.2 4.9 5.2(H)  Chloride 101 - 111 mmol/L 99(L) 99(L) 101  CO2 22 - 32 mmol/L 31 31 28   Calcium 8.9 - 10.3 mg/dL 9.3 9.7 9.2  Total Protein 6.5 - 8.1 g/dL - - -  Total Bilirubin 0.3 - 1.2 mg/dL - - -  Alkaline Phos 38 - 126 U/L - - -  AST 15 - 41 U/L - - -  ALT 14 - 54 U/L - - -     CBC Latest Ref Rng 01/24/2015 01/23/2015 01/22/2015  WBC 4.0 - 10.5 K/uL 4.5 6.7 5.1  Hemoglobin 12.0 - 15.0 g/dL 11.7(L) 11.6(L) 11.4(L)  Hematocrit 36.0 - 46.0 % 37.2 35.9(L) 36.1  Platelets 150 - 400 K/uL 284 264 280     No results found.   CULTURES: 12/21 Pericardial fluid >> negative  ANTIBIOTICS: 12/26 Vancomycin >> 12/27 12/26 Zosyn >>  STUDIES: 12/20 Echo >> mild LVH, EF 55 to 60%, mild AS, mild/mod AR, mild MR, mod TR, PAS 66 mmHg, large pericardial effusion 12/21 Pericardial fluid >> 1200 ml, WBC 129 (94% M), pathology negative for malignancy  EVENTS: 12/20  admit, TCTS consulted 12/21 Pericardial window 12/23 Pericardial drain d/c'ed 12/24 Cough, wheezing develop 12/28 Add solumedrol  DISCUSSION: 81 yo female admitted for pericardial window with progressive pericardial effusion.  She subsequently developed cough, wheezing.  Concern for HCAP with acute asthmatic bronchitis, acute on chronic diastolic CHF, and upper airway cough with post-nasal drip.  ASSESSMENT/PLAN:  HCAP. Plan:  - day 4 of Abx >> continue IV zosyn for now - f/u CXR 12/30  Acute asthmatic bronchitis. Plan: - continue pulmicort, brovana - prn albuterol - continue solumedrol IV for now - bronchial hygiene  Upper airway cough with post nasal drip. Plan: - flonase  Acute on chronic diastolic CHF. Hx of A fib. Pericardial effusion s/p window. Plan: - per cardiology and TCTS  Acute hypoxic respiratory failure. Plan: - assessing for home oxygen    Chesley Mires, MD [place] 01/24/2015, 10:03 AM Pager:  [phone] After 3pm call: [phone]

## 2015-01-24 NOTE — Care Management Note (Addendum)
Case Management Note Previous CM note initiated by [facility] RN, CM   Patient Details  Name: LORALEI QUIROS MRN: [id number] Date of Birth: 10/01/1923  Subjective/Objective: Pt lives alone but has arranged to go home with dtr who will provide 24/7 assistance when she is medically stable for discharge.     Expected Discharge Date:     Expected Discharge Plan: Home/Self Care  In-House Referral:    Discharge planning Services CM Consult  Post Acute Care Choice:   Choice offered to:    DME Arranged: Oxygen   DME Agency:  [place]:   HH Agency:    Status of Service:Completed  Medicare Important Message Given: Yes Date Medicare IM Given:   Medicare IM give by:   Date Additional Medicare IM Given:   Additional Medicare Important Message give by:    If discussed at [place] of Stay Meetings, dates discussed: 01/22/15  Additional Comments: 01-24-15 [address], [place] [phone] CM did offer choice for 02 and pt chose [facility]- CM made referral to [facility] and [place] to begin within 24-48 hrs post d/c. No further needs from CM at this time.    01/22/15- per PT notes- no recommendations for d/c needs or follow up - plan to d/c home with daughter, [facility] referral- and to follow pt upon discharge.  Bethena Roys, RN  01/24/2015, 10:26 AM

## 2015-01-24 NOTE — Progress Notes (Signed)
ANTICOAGULATION CONSULT NOTE - Initial Consult  Pharmacy Consult for coumadin/zosyn Indication: atrial fibrillation/PNA  Allergies  Allergen Reactions  . Iodinated Diagnostic Agents Hives    HIVES 15MIN S/P IV CONTRAST INJECTION,WILL NEED 13 HR PREP FOR FUTURE INJECTIONS, ok s/p 50mg  po benadryl//a.calhoun  . Anti-Inflammatory Enzyme [Nutritional Supplements]     Retains fluids and headaches  . Arthrotec [Diclofenac-Misoprostol] Other (See Comments)    unknown  . Biaxin [Clarithromycin] Other (See Comments)    unknown  . Plavix [Clopidogrel Bisulfate] Other (See Comments)    unknown  . Spironolactone     Hair loss    Patient Measurements: Height: 5\' 5"  (165.1 cm) Weight: 159 lb 1.6 oz (72.167 kg) IBW/kg (Calculated) : 57 Heparin Dosing Weight: 72 kg  Vital Signs: Temp: 98.7 F (37.1 C) (12/29 0510) Temp Source: Oral (12/29 0510) BP: 119/56 mmHg (12/29 0510) Pulse Rate: 69 (12/29 0854)  Labs:  Recent Labs  01/21/15 2059  01/22/15 0602 01/23/15 0540 01/24/15 0358  HGB  --   < > 11.4* 11.6* 11.7*  HCT  --   --  36.1 35.9* 37.2  PLT  --   --  280 264 284  LABPROT  --   --  23.8* 24.0* 24.6*  INR  --   --  2.14* 2.17* 2.24*  HEPARINUNFRC 0.58  --  0.77*  --   --   CREATININE  --   --  1.49*  --  1.45*  < > = values in this interval not displayed.  Estimated Creatinine Clearance: 26.2 mL/min (by C-G formula based on Cr of 1.45).   Medical History: Past Medical History  Diagnosis Date  . Pericardial effusion     Chronic. Last echo in April of 2012.   . Atrial fibrillation (HCC)     Chronic  . Chronic anticoagulation     on Coumadin  . History of GI bleed     once treated with Fe infusion  . HTN (hypertension)   . Pacemaker     Medtronic, placed for tachybrady.   . Valvular heart disease   . Obesity   . LVH (left ventricular hypertrophy)   . Thrombophlebitis 1967    associated with pregnancy  . Diverticulosis   . Heart murmur   . Osteoporosis   . Hx  of echocardiogram 07/28/2011    EF>55% small to moderate and is mostly located posteriorly. there is no overt evidence of tamponade although subtle diastolic collapse of the right ventricle.  . Sleep apnea     mild-no cpap  . History of blood transfusion "several times"  . CHF (congestive heart failure) ([place])   . Pneumonia ~ 1986  . Positive TB test   . Anemia   . Iron deficiency anemia     "I've had an iron transfusion"  . Stroke [facility]) April 2013    right frontal  . Arthritis   . Tachycardia-bradycardia syndrome [facility])     s/p Medtronic Sensia  model number [id number], serial number [id number] H 12/2013  . Acute on chronic diastolic CHF (congestive heart failure), NYHA class 3 ([place]) 01/15/2015  . History of shingles    Assessment:  81 y/o F with SOB, pericardial effusion. Pharmacy consulted to dose heparin for atrial fibrillation. Pharmacy also consulted to dose warfarin from PTA. Pt is s/p vit K and has a sub-therapeutic INR. INR is now in the therapeutic range again at 2.24. CCM has also added vanc/zosyn for possible HAP. MRSA PCR is neg. Scr 1.45. Day 4  zosyn now. All cultures are neg. Pt remains afebrile, WBC wnl. Note mentioned to consider changing abx to PO. Consider changing zosyn to Ceftin 500mg .   Goal of Therapy:  INR = 2-3   Plan:   Comadin 2.5mg  qday except 5mg  MWF Daily PT/INR Zosyn 3.375g IV q8 Consider changing zosyn to Ceftin 500mg  PO qday x 5 more days  Onnie Boer, [name] Pager: [phone] 01/24/2015 12:45 PM

## 2015-01-24 NOTE — Progress Notes (Signed)
Chest tube suture removed.

## 2015-01-24 NOTE — Progress Notes (Signed)
PT Cancellation Note  Patient Details Name: [name] MRN: [id number] DOB: 01-06-1924   Cancelled Treatment:    Reason Eval/Treat Not Completed: Other (comment)  Observed pt up ambulating with N[facility] staff.  Spoke with pt after ambulation and pt indicates no further PT needs at this time and is anticipating D/C to home today.  If pt does not D/C today, will f/u to ensure no needs.     Emilio Baylock, Thornton Papas 01/24/2015, 10:28 AM

## 2015-01-24 NOTE — Plan of Care (Signed)
Problem: Nutrition: Goal: Adequate nutrition will be maintained Outcome: Completed/Met Date Met:  01/24/15 Pt eating 75-100% of meal intake.

## 2015-01-24 NOTE — Progress Notes (Signed)
Echocardiogram 2D Echocardiogram limited has been performed.  [name] 01/24/2015, 3:13 PM

## 2015-01-25 ENCOUNTER — Inpatient Hospital Stay (HOSPITAL_COMMUNITY): Payer: Medicare Other

## 2015-01-25 ENCOUNTER — Other Ambulatory Visit: Payer: Self-pay | Admitting: *Deleted

## 2015-01-25 LAB — CBC
HEMATOCRIT: 36.9 % (ref 36.0–46.0)
Hemoglobin: 11.5 g/dL — ABNORMAL LOW (ref 12.0–15.0)
MCH: 28.3 pg (ref 26.0–34.0)
MCHC: 31.2 g/dL (ref 30.0–36.0)
MCV: 90.7 fL (ref 78.0–100.0)
Platelets: 267 10*3/uL (ref 150–400)
RBC: 4.07 MIL/uL (ref 3.87–5.11)
RDW: 15 % (ref 11.5–15.5)
WBC: 7.2 10*3/uL (ref 4.0–10.5)

## 2015-01-25 LAB — PROCALCITONIN

## 2015-01-25 LAB — PROTIME-INR
INR: 2.81 — ABNORMAL HIGH (ref 0.00–1.49)
Prothrombin Time: 29.1 seconds — ABNORMAL HIGH (ref 11.6–15.2)

## 2015-01-25 MED ORDER — DIGOXIN 125 MCG PO TABS
0.0625 mg | ORAL_TABLET | Freq: Every day | ORAL | Status: DC
  Administered 2015-01-25 – 2015-01-26 (×2): 0.0625 mg via ORAL
  Filled 2015-01-25 (×2): qty 1

## 2015-01-25 MED ORDER — AMOXICILLIN-POT CLAVULANATE 875-125 MG PO TABS
1.0000 | ORAL_TABLET | Freq: Two times a day (BID) | ORAL | Status: DC
Start: 2015-01-25 — End: 2015-01-25

## 2015-01-25 MED ORDER — AMOXICILLIN-POT CLAVULANATE 500-125 MG PO TABS
500.0000 mg | ORAL_TABLET | Freq: Two times a day (BID) | ORAL | Status: DC
  Administered 2015-01-25 – 2015-01-26 (×3): 500 mg via ORAL
  Filled 2015-01-25 (×5): qty 1

## 2015-01-25 MED ORDER — PREDNISONE 20 MG PO TABS
30.0000 mg | ORAL_TABLET | Freq: Every day | ORAL | Status: DC
  Administered 2015-01-25 – 2015-01-26 (×2): 30 mg via ORAL
  Filled 2015-01-25 (×2): qty 1

## 2015-01-25 NOTE — Progress Notes (Signed)
    SUBJECTIVE:  Breathing OK and felt good with ambulation yesterday while on O2.  No pain.    PHYSICAL EXAM Filed Vitals:   01/24/15 1435 01/24/15 2034 01/24/15 2135 01/25/15 0647  BP: 122/52  130/53 112/54  Pulse: 62  74 71  Temp: 99.3 F (37.4 C)  98.6 F (37 C) 98.2 F (36.8 C)  TempSrc: Oral  Oral Oral  Resp: 22  18 18   Height:      Weight:    160 lb 12.8 oz (72.938 kg)  SpO2: 92% 93% 96% 95%   General:  No distress Lungs:  Clear Heart:  Irregular Abdomen:  Positive bowel sounds, no rebound no guarding Extremities:  Mild edema to the knees.    LABS: No results found for: TROPONINI Results for orders placed or performed during the hospital encounter of 01/15/15 (from the past 24 hour(s))  CBC     Status: Abnormal   Collection Time: 01/25/15  4:56 AM  Result Value Ref Range   WBC 7.2 4.0 - 10.5 K/uL   RBC 4.07 3.87 - 5.11 MIL/uL   Hemoglobin 11.5 (L) 12.0 - 15.0 g/dL   HCT 36.9 36.0 - 46.0 %   MCV 90.7 78.0 - 100.0 fL   MCH 28.3 26.0 - 34.0 pg   MCHC 31.2 30.0 - 36.0 g/dL   RDW 15.0 11.5 - 15.5 %   Platelets 267 150 - 400 K/uL  Protime-INR     Status: Abnormal   Collection Time: 01/25/15  4:56 AM  Result Value Ref Range   Prothrombin Time 29.1 (H) 11.6 - 15.2 seconds   INR 2.81 (H) 0.00 - 1.49  Procalcitonin     Status: None   Collection Time: 01/25/15  4:56 AM  Result Value Ref Range   Procalcitonin <0.10 ng/mL    Intake/Output Summary (Last 24 hours) at 01/25/15 0841 Last data filed at 01/25/15 [id number]  Gross per 24 hour  Intake    870 ml  Output    300 ml  Net    570 ml    ECHO:  Compared to the prior study from 01/16/2015 (post pericardiocentesis TEE), there is a minimal pericardial effusion inferiorly to the LV and a mild effusion in the proximity ro the right atrium with no signs of hemodynamic compromise.  ASSESSMENT AND PLAN:  HCAP/ACUTE ASTHMATIC BRONCHITIS:  Day number 5 of antibiotics.  Plan per CCM.    She has decreased sats with  ambulation.  Home O2 has been arranged.    ACUTE ON CHRONIC DIASTOLIC HF:   Home on 60 of [place].  However, she will need close follow up of her creatinine.  Needs TOC appt next week.   PERICARDIAL EFFUSION:  Status post window.   No residual effusion on echo.   CKD:  Creat stable yesterday.  Follow up as an outpatient.   ATRIAL FIB/HISTORY OF PE:   On warfarin.  I reduced her digoxin dose.     Jeneen Rinks [facility] 01/25/2015 8:41 AM

## 2015-01-25 NOTE — Progress Notes (Signed)
ANTICOAGULATION CONSULT NOTE - Follow Up Consult  Pharmacy Consult for Coumadin Indication: atrial fibrillation  Allergies  Allergen Reactions  . Iodinated Diagnostic Agents Hives    HIVES 15MIN S/P IV CONTRAST INJECTION,WILL NEED 13 HR PREP FOR FUTURE INJECTIONS, ok s/p 50mg  po benadryl//a.calhoun  . Anti-Inflammatory Enzyme [Nutritional Supplements]     Retains fluids and headaches  . Arthrotec [Diclofenac-Misoprostol] Other (See Comments)    unknown  . Biaxin [Clarithromycin] Other (See Comments)    unknown  . Plavix [Clopidogrel Bisulfate] Other (See Comments)    unknown  . Spironolactone     Hair loss    Patient Measurements: Height: 5\' 5"  (165.1 cm) Weight: 160 lb 12.8 oz (72.938 kg) IBW/kg (Calculated) : 57  Vital Signs: Temp: 98.2 F (36.8 C) (12/30 0647) Temp Source: Oral (12/30 0647) BP: 112/54 mmHg (12/30 0647) Pulse Rate: 71 (12/30 0647)  Labs:  Recent Labs  01/23/15 0540 01/24/15 0358 01/25/15 0456  HGB 11.6* 11.7* 11.5*  HCT 35.9* 37.2 36.9  PLT 264 284 267  LABPROT 24.0* 24.6* 29.1*  INR 2.17* 2.24* 2.81*  CREATININE  --  1.45*  --     Estimated Creatinine Clearance: 26.3 mL/min (by C-G formula based on Cr of 1.45).  Assessment: 81 y/o F with SOB, pericardial effusion. Pharmacy consulted to dose warfarin from PTA. PTA warfarin dose 2.5 mg daily except 5 mg on MWF.   AC: Coum for afib. INR 2.81 up today - PTA warfarin dose 2.5 mg daily except 5 mg on MWF.   Goal of Therapy:  INR 2-3 Monitor platelets by anticoagulation protocol: Yes   Plan:  Coumadin 2.5mg  PO qday except 5mg  MWF INR MWF if stable in AM  Alford Highland, [employer] 01/25/2015,1:22 PM

## 2015-01-25 NOTE — Progress Notes (Addendum)
Physical Therapy Treatment Patient Details Name: [name] MRN: [id number] DOB: 01-07-1924 Today's Date: 01/25/2015    History of Present Illness 81 yo F admitted SOB and DOE. Has a history of pericardial effusion (dx 2012), perm afib w/ MDT PPM 12/2013 for slow VR, chronic coumadin, CVA, GIB 2nd AVMs. Underwent pericardial window and has HCAP.     PT Comments    Ms. Costabile continues to make excellent progress w/ therapy, ambulating 200 ft. SpO2 fluctuates from 84-91% on RA, pt requiring 2 LPM O2 via [place] periodically when O2 drops.    Follow Up Recommendations  Supervision for mobility/OOB;Outpatient PT;Other (comment) (OPPT to specifically address mid back pain)     Equipment Recommendations  None recommended by PT    Recommendations for Other Services       Precautions / Restrictions Precautions Precautions: Fall Precaution Comments: monitor O2 Restrictions Weight Bearing Restrictions: No    Mobility  Bed Mobility Overal bed mobility: Independent                Transfers Overall transfer level: Needs assistance Equipment used: Rolling walker (2 wheeled) Transfers: Sit to/from Stand Sit to Stand: Supervision         General transfer comment: Cues to push from bed as pt pulls on RW to stand  Ambulation/Gait Ambulation/Gait assistance: Supervision Ambulation Distance (Feet): 200 Feet Assistive device: Rolling walker (2 wheeled) Gait Pattern/deviations: Step-through pattern;Decreased stride length;Trunk flexed   Gait velocity interpretation: at or above normal speed for age/gender General Gait Details: No instability or LOB using RW.  SpO2 down to 84% on RA at start of session, [place] donned at 2 LPM to reach 90's.  At end of sesion SpO2 at 91% on RA.   Stairs            Wheelchair Mobility    Modified Rankin (Stroke Patients Only)       Balance Overall balance assessment: Needs assistance Sitting-balance support: Feet supported Sitting  balance-Leahy Scale: Good     Standing balance support: During functional activity Standing balance-Leahy Scale: Fair                      Cognition Arousal/Alertness: Awake/alert Behavior During Therapy: WFL for tasks assessed/performed Overall Cognitive Status: Within Functional Limits for tasks assessed                      Exercises General Exercises - Lower Extremity Ankle Circles/Pumps: AROM;Both;10 reps;Seated Long Arc Quad: AROM;Both;10 reps;Seated Hip Flexion/Marching: AROM;Both;10 reps;Seated    General Comments        Pertinent Vitals/Pain Pain Assessment: Faces Faces Pain Scale: Hurts little more Pain Location: Lt mid back Pain Descriptors / Indicators: Aching;Discomfort Pain Intervention(s): Limited activity within patient's tolerance;Monitored during session;Repositioned;Heat applied    Home Living                      Prior Function            PT Goals (current goals can now be found in the care plan section) Acute Rehab PT Goals Patient Stated Goal: none stated PT Goal Formulation: With patient Time For Goal Achievement: 01/29/15 Potential to Achieve Goals: Good Progress towards PT goals: Progressing toward goals    Frequency  Min 3X/week    PT Plan Discharge plan needs to be updated    Co-evaluation             End of Session Equipment  Utilized During Treatment: Gait belt;Oxygen Activity Tolerance: Patient tolerated treatment well Patient left: in bed;with call bell/phone within reach;with SCD's reapplied     Time: [id number] PT Time Calculation (min) (ACUTE ONLY): 21 min  Charges:  $Gait Training: 8-22 mins                    G Codes:      Joslyn Hy PT, [place] [id number] Pager: [phone] 01/25/2015, 4:03 PM

## 2015-01-25 NOTE — Progress Notes (Signed)
PCCM PROGRESS NOTE  ADMISSION DATE: 01/15/2015 CONSULT DATE: 01/21/2015 REFERRING PROVIDER: Cr. Croitoru  CC: Wheezing  SUBJECTIVE: Breathing better.  Still has cough.  Had band like tightness around her chest >> better now.  Walking better when using oxygen.  VITALS SIGNS: BP 112/54 mmHg  Pulse 71  Temp(Src) 98.2 F (36.8 C) (Oral)  Resp 18  Ht 5\' 5"  (1.651 m)  Wt 160 lb 12.8 oz (72.938 kg)  BMI 26.76 kg/m2  SpO2 95%  INTAKE/OUTPUT: I/O last 3 completed shifts: In: 1160 [P.O.:960; IV Piggyback:200] Out: 950 [Urine:950]  General: pleasant HEENT: clear nasal discharge Cardiac: regular, 2/6 SM Chest: better air movement, no wheeze Abd: soft, nontender Ext: 1+ edema Neuro: normal strength Skin: no rashes   CMP Latest Ref Rng 01/24/2015 01/22/2015 01/21/2015  Glucose 65 - 99 mg/dL 165(H) 184(H) 104(H)  BUN 6 - 20 mg/dL 33(H) 28(H) 22(H)  Creatinine 0.44 - 1.00 mg/dL 1.45(H) 1.49(H) 1.02(H)  Sodium 135 - 145 mmol/L 141 138 139  Potassium 3.5 - 5.1 mmol/L 4.2 4.9 5.2(H)  Chloride 101 - 111 mmol/L 99(L) 99(L) 101  CO2 22 - 32 mmol/L 31 31 28   Calcium 8.9 - 10.3 mg/dL 9.3 9.7 9.2  Total Protein 6.5 - 8.1 g/dL - - -  Total Bilirubin 0.3 - 1.2 mg/dL - - -  Alkaline Phos 38 - 126 U/L - - -  AST 15 - 41 U/L - - -  ALT 14 - 54 U/L - - -     CBC Latest Ref Rng 01/25/2015 01/24/2015 01/23/2015  WBC 4.0 - 10.5 K/uL 7.2 4.5 6.7  Hemoglobin 12.0 - 15.0 g/dL 11.5(L) 11.7(L) 11.6(L)  Hematocrit 36.0 - 46.0 % 36.9 37.2 35.9(L)  Platelets 150 - 400 K/uL 267 284 264     Dg Chest 2 View  01/25/2015  CLINICAL DATA:  Shortness of breath, pneumonia. EXAM: CHEST  2 VIEW COMPARISON:  Chest x-rays dated 01/22/2015, 01/19/2015 and 01/17/2015. FINDINGS: Patchy opacities again seen at the right lung base, stable compared to 01/22/2015, new compared to the earlier study of 01/17/2015, suggesting right middle lobe and/or right lower lobe pneumonia. Suspect additional bibasilar  atelectasis. Cardiomegaly is unchanged. Mild central pulmonary vascular congestion with associated mild bilateral interstitial edema is unchanged. No frank alveolar pulmonary edema. Right chest wall pacemaker/AICD is stable in position. Osseous and soft tissue structures about the chest are unremarkable. IMPRESSION: No appreciable change from most recent chest x-ray of 01/22/2015. Findings again suggest right middle lobe and/or right lower lobe pneumonia. Probable additional bibasilar atelectasis. Persistent mild pulmonary vascular congestion and interstitial edema suggesting mild CHF/volume overload. Electronically Signed   By: Franki Cabot M.D.   On: 01/25/2015 08:09     CULTURES: 12/21 Pericardial fluid >> negative  ANTIBIOTICS: 12/26 Vancomycin >> 12/27 12/26 Zosyn >> 12/30 12/30 Augmentin  STUDIES: 12/20 Echo >> mild LVH, EF 55 to 60%, mild AS, mild/mod AR, mild MR, mod TR, PAS 66 mmHg, large pericardial effusion 12/21 Pericardial fluid >> 1200 ml, WBC 129 (94% M), pathology negative for malignancy  EVENTS: 12/20 admit, TCTS consulted 12/21 Pericardial window 12/23 Pericardial drain d/c'ed 12/24 Cough, wheezing develop 12/28 Add solumedrol 12/30 change to prednisone, po Abx  DISCUSSION: 81 yo female admitted for pericardial window with progressive pericardial effusion.  She subsequently developed cough, wheezing.  Concern for HCAP with acute asthmatic bronchitis, acute on chronic diastolic CHF, and upper airway cough with post-nasal drip.  ASSESSMENT/PLAN:  HCAP. Plan:  - day 5/7 of Abx >> changed  to augmentin 12/30 - f/u CXR as outpt  Acute asthmatic bronchitis. Plan: - continue pulmicort, brovana - prn albuterol - change to prednisone on 12/30 and wean off over next one week as tolerated - bronchial hygiene  Upper airway cough with post nasal drip. Plan: - flonase  Acute on chronic diastolic CHF. Hx of A fib. Pericardial effusion s/p window. Plan: - per  cardiology and TCTS  Acute hypoxic respiratory failure. Plan: - primary team arranging for home oxygen   Disposition >> if she tolerates transition to po Abx and prednisone, then she should be okay for d/c home on 12/31 from pulmonary standpoint.  Chesley Mires, MD [facility] Pulmonary/Critical Care 01/25/2015, 1:12 PM Pager:  [phone] After 3pm call: [phone]

## 2015-01-25 NOTE — Plan of Care (Signed)
Problem: Physical Regulation: Goal: Ability to maintain clinical measurements within normal limits will improve Outcome: Progressing Patient maintains O2 sats in the 90s while wearing oxygen. Will go home on O2.  Problem: Skin Integrity: Goal: Risk for impaired skin integrity will decrease Outcome: Progressing Patient has no adverse skin issues at this time.

## 2015-01-25 NOTE — Clinical Social Work Note (Signed)
CSW was notified by [facility] that patient is stating she wants to go to SNF so that she isn't a burden on her family. The patient does not present with a skilled need at this time. CSW signing off.  Liz Beach MSW, [place], [place], [id number]

## 2015-01-25 NOTE — Care Management Important Message (Signed)
Important Message  Patient Details  Name: [name] MRN: [id number] Date of Birth: 28-Nov-1923   Medicare Important Message Given:  Yes    [name] 01/25/2015, 3:26 PM

## 2015-01-25 NOTE — Telephone Encounter (Signed)
Called in.

## 2015-01-26 ENCOUNTER — Encounter (HOSPITAL_COMMUNITY): Payer: Self-pay | Admitting: Physician Assistant

## 2015-01-26 DIAGNOSIS — E669 Obesity, unspecified: Secondary | ICD-10-CM | POA: Diagnosis present

## 2015-01-26 DIAGNOSIS — I482 Chronic atrial fibrillation, unspecified: Secondary | ICD-10-CM | POA: Diagnosis present

## 2015-01-26 MED ORDER — PREDNISONE 10 MG PO TABS: 30.0000 mg | ORAL_TABLET | Freq: Every day | ORAL | Status: DC

## 2015-01-26 MED ORDER — AMOXICILLIN-POT CLAVULANATE 500-125 MG PO TABS: 500.0000 mg | ORAL_TABLET | Freq: Two times a day (BID) | ORAL | Status: DC

## 2015-01-26 MED ORDER — TORSEMIDE 20 MG PO TABS: 60.0000 mg | ORAL_TABLET | Freq: Every day | ORAL | Status: DC

## 2015-01-26 MED ORDER — PANTOPRAZOLE SODIUM 40 MG PO TBEC: 40.0000 mg | DELAYED_RELEASE_TABLET | Freq: Two times a day (BID) | ORAL | Status: DC

## 2015-01-26 MED ORDER — FLUTICASONE PROPIONATE 50 MCG/ACT NA SUSP: 2.0000 | Freq: Two times a day (BID) | NASAL | Status: DC

## 2015-01-26 MED ORDER — ARFORMOTEROL TARTRATE 15 MCG/2ML IN NEBU: 15.0000 ug | INHALATION_SOLUTION | Freq: Two times a day (BID) | RESPIRATORY_TRACT | Status: DC

## 2015-01-26 MED ORDER — ALBUTEROL SULFATE (2.5 MG/3ML) 0.083% IN NEBU: 2.5000 mg | INHALATION_SOLUTION | RESPIRATORY_TRACT | Status: DC | PRN

## 2015-01-26 MED ORDER — BUDESONIDE 0.25 MG/2ML IN SUSP: 0.2500 mg | Freq: Two times a day (BID) | RESPIRATORY_TRACT | Status: DC

## 2015-01-26 MED ORDER — AMOXICILLIN-POT CLAVULANATE 500-125 MG PO TABS
500.0000 mg | ORAL_TABLET | Freq: Two times a day (BID) | ORAL | Status: DC
Start: 2015-01-26 — End: 2015-01-26

## 2015-01-26 MED ORDER — DIGOXIN 62.5 MCG PO TABS: 0.0625 mg | ORAL_TABLET | Freq: Every day | ORAL | Status: DC

## 2015-01-26 NOTE — Progress Notes (Signed)
PCCM PROGRESS NOTE  ADMISSION DATE: 01/15/2015 CONSULT DATE: 01/21/2015 REFERRING PROVIDER: Cr. Croitoru  CC: Wheezing  SUBJECTIVE: Breathing better.   Harsh upper airway cough / min mucoid sputum   VITALS SIGNS: BP 129/62 mmHg  Pulse 82  Temp(Src) 97.6 F (36.4 C) (Oral)  Resp 18  Ht 5\' 5"  (1.651 m)  Wt 161 lb 1.6 oz (73.074 kg)  BMI 26.81 kg/m2  SpO2 93%  INTAKE/OUTPUT: I/O last 3 completed shifts: In: 1210 [P.O.:1160; IV Piggyback:50] Out: 200 [Urine:200]  General: pleasant but very freq throat clearing  HEENT: clear nasal discharge Cardiac: regular, 2/6 SM Chest: ok  air movement, no wheeze Abd: soft, nontender Ext: trace  edema Neuro: normal strength Skin: no rashes   CMP Latest Ref Rng 01/24/2015 01/22/2015 01/21/2015  Glucose 65 - 99 mg/dL 165(H) 184(H) 104(H)  BUN 6 - 20 mg/dL 33(H) 28(H) 22(H)  Creatinine 0.44 - 1.00 mg/dL 1.45(H) 1.49(H) 1.02(H)  Sodium 135 - 145 mmol/L 141 138 139  Potassium 3.5 - 5.1 mmol/L 4.2 4.9 5.2(H)  Chloride 101 - 111 mmol/L 99(L) 99(L) 101  CO2 22 - 32 mmol/L 31 31 28   Calcium 8.9 - 10.3 mg/dL 9.3 9.7 9.2  Total Protein 6.5 - 8.1 g/dL - - -  Total Bilirubin 0.3 - 1.2 mg/dL - - -  Alkaline Phos 38 - 126 U/L - - -  AST 15 - 41 U/L - - -  ALT 14 - 54 U/L - - -     CBC Latest Ref Rng 01/25/2015 01/24/2015 01/23/2015  WBC 4.0 - 10.5 K/uL 7.2 4.5 6.7  Hemoglobin 12.0 - 15.0 g/dL 11.5(L) 11.7(L) 11.6(L)  Hematocrit 36.0 - 46.0 % 36.9 37.2 35.9(L)  Platelets 150 - 400 K/uL 267 284 264     Dg Chest 2 View  01/25/2015  CLINICAL DATA:  Shortness of breath, pneumonia. EXAM: CHEST  2 VIEW COMPARISON:  Chest x-rays dated 01/22/2015, 01/19/2015 and 01/17/2015. FINDINGS: Patchy opacities again seen at the right lung base, stable compared to 01/22/2015, new compared to the earlier study of 01/17/2015, suggesting right middle lobe and/or right lower lobe pneumonia. Suspect additional bibasilar atelectasis. Cardiomegaly is unchanged.  Mild central pulmonary vascular congestion with associated mild bilateral interstitial edema is unchanged. No frank alveolar pulmonary edema. Right chest wall pacemaker/AICD is stable in position. Osseous and soft tissue structures about the chest are unremarkable. IMPRESSION: No appreciable change from most recent chest x-ray of 01/22/2015. Findings again suggest right middle lobe and/or right lower lobe pneumonia. Probable additional bibasilar atelectasis. Persistent mild pulmonary vascular congestion and interstitial edema suggesting mild CHF/volume overload. Electronically Signed   By: Franki Cabot M.D.   On: 01/25/2015 08:09     CULTURES: 12/21 Pericardial fluid >> negative  ANTIBIOTICS: 12/26 Vancomycin >> 12/27 12/26 Zosyn >> 12/30 12/30 Augmentin >>>  STUDIES: 12/20 Echo >> mild LVH, EF 55 to 60%, mild AS, mild/mod AR, mild MR, mod TR, PAS 66 mmHg, large pericardial effusion 12/21 Pericardial fluid >> 1200 ml, WBC 129 (94% M), pathology negative for malignancy  EVENTS: 12/20 admit, TCTS consulted 12/21 Pericardial window 12/23 Pericardial drain d/c'ed 12/24 Cough, wheezing developed 12/28 Add solumedrol 12/30 changed to prednisone, po Abx  DISCUSSION: 81 yo female admitted for pericardial window with progressive pericardial effusion.  She subsequently developed cough, wheezing.  Concern for HCAP with acute asthmatic bronchitis, acute on chronic diastolic CHF, and upper airway cough with post-nasal drip.  ASSESSMENT/PLAN:  HCAP. Plan:  - day 6/7 of Abx >> changed to  augmentin 12/30 - f/u CXR as outpt  Acute asthmatic bronchitis vs all pseudoasthma Plan: - continue pulmicort, brovana - prn albuterol - change to prednisone on 12/30 and wean off over next one week as tolerated - bronchial hygiene/flutter valve as much as possible   Upper airway cough with post nasal drip. Plan: - flonase -- max gerd rx added 12/31  Acute on chronic diastolic CHF. Hx of A  fib. Pericardial effusion s/p window with ? Small pleural effusions also ? Etiology  Plan: - per cardiology and TCTS  Acute hypoxic respiratory failure. Plan: - primary team arranging for home oxygen  - check RA walking sats 12/31 may not need 24 02   Disposition >> ok for d/c home on max rx for gerd and f/u in one week in office with CXR with Dr Halford Chessman, Rexene Edison NP or me    Christinia Gully, MD Pulmonary and [place] [phone] After 5:30 PM or weekends, call [phone]

## 2015-01-26 NOTE — Discharge Instructions (Signed)
Information on my medicine - Coumadin®   (Warfarin) ° °This medication education was reviewed with me or my healthcare representative as part of my discharge preparation.  The pharmacist that spoke with me during my hospital stay was:  Roosvelt Churchwell P, RPH ° °Why was Coumadin prescribed for you? °Coumadin was prescribed for you because you have a blood clot or a medical condition that can cause an increased risk of forming blood clots. Blood clots can cause serious health problems by blocking the flow of blood to the heart, lung, or brain. Coumadin can prevent harmful blood clots from forming. °As a reminder your indication for Coumadin is:   Stroke Prevention Because Of Atrial Fibrillation ° °What test will check on my response to Coumadin? °While on Coumadin (warfarin) you will need to have an INR test regularly to ensure that your dose is keeping you in the desired range. The INR (international normalized ratio) number is calculated from the result of the laboratory test called prothrombin time (PT). ° °If an INR APPOINTMENT HAS NOT ALREADY BEEN MADE FOR YOU please schedule an appointment to have this lab work done by your health care provider within 7 days. °Your INR goal is usually a number between:  2 to 3 or your provider may give you a more narrow range like 2-2.5.  Ask your health care provider during an office visit what your goal INR is. ° °What  do you need to  know  About  COUMADIN? °Take Coumadin (warfarin) exactly as prescribed by your healthcare provider about the same time each day.  DO NOT stop taking without talking to the doctor who prescribed the medication.  Stopping without other blood clot prevention medication to take the place of Coumadin may increase your risk of developing a new clot or stroke.  Get refills before you run out. ° °What do you do if you miss a dose? °If you miss a dose, take it as soon as you remember on the same day then continue your regularly scheduled regimen the next  day.  Do not take two doses of Coumadin at the same time. ° °Important Safety Information °A possible side effect of Coumadin (Warfarin) is an increased risk of bleeding. You should call your healthcare provider right away if you experience any of the following: °? Bleeding from an injury or your nose that does not stop. °? Unusual colored urine (red or dark brown) or unusual colored stools (red or black). °? Unusual bruising for unknown reasons. °? A serious fall or if you hit your head (even if there is no bleeding). ° °Some foods or medicines interact with Coumadin® (warfarin) and might alter your response to warfarin. To help avoid this: °? Eat a balanced diet, maintaining a consistent amount of Vitamin K. °? Notify your provider about major diet changes you plan to make. °? Avoid alcohol or limit your intake to 1 drink for women and 2 drinks for men per day. °(1 drink is 5 oz. wine, 12 oz. beer, or 1.5 oz. liquor.) ° °Make sure that ANY health care provider who prescribes medication for you knows that you are taking Coumadin (warfarin).  Also make sure the healthcare provider who is monitoring your Coumadin knows when you have started a new medication including herbals and non-prescription products. ° °Coumadin® (Warfarin)  Major Drug Interactions  °Increased Warfarin Effect Decreased Warfarin Effect  °Alcohol (large quantities) °Antibiotics (esp. Septra/Bactrim, Flagyl, Cipro) °Amiodarone (Cordarone) °Aspirin (ASA) °Cimetidine (Tagamet) °Megestrol (Megace) °NSAIDs (ibuprofen,   naproxen, etc.) Piroxicam (Feldene) Propafenone (Rythmol SR) Propranolol (Inderal) Isoniazid (INH) Posaconazole (Noxafil) Barbiturates (Phenobarbital) Carbamazepine (Tegretol) Chlordiazepoxide (Librium) Cholestyramine (Questran) Griseofulvin Oral Contraceptives Rifampin Sucralfate (Carafate) Vitamin K   Coumadin (Warfarin) Major Herbal Interactions  Increased Warfarin Effect Decreased Warfarin Effect   Garlic Ginseng Ginkgo biloba Coenzyme Q10 Green tea St. Johns wort    Coumadin (Warfarin) FOOD Interactions  Eat a consistent number of servings per week of foods HIGH in Vitamin K (1 serving =  cup)  Collards (cooked, or boiled & drained) Kale (cooked, or boiled & drained) Mustard greens (cooked, or boiled & drained) Parsley *serving size only =  cup Spinach (cooked, or boiled & drained) Swiss chard (cooked, or boiled & drained) Turnip greens (cooked, or boiled & drained)  Eat a consistent number of servings per week of foods MEDIUM-HIGH in Vitamin K (1 serving = 1 cup)  Asparagus (cooked, or boiled & drained) Broccoli (cooked, boiled & drained, or raw & chopped) Brussel sprouts (cooked, or boiled & drained) *serving size only =  cup Lettuce, raw (green leaf, endive, romaine) Spinach, raw Turnip greens, raw & chopped   These websites have more information on Coumadin (warfarin):  FailFactory.se; VeganReport.com.au;   Pericardial Effusion Pericardial effusion is a buildup of fluid around your heart. The heart is surrounded by a double-layered sac (pericardium). This sac normally contains a small amount of fluid. When too much builds up, it can put pressure on your heart and cause problems. As fluid builds up in the pericardial sac and pressure on your heart increases, it becomes harder for your heart to pump blood. When fluid prevents your heart from pumping enough blood, it is called cardiac tamponade. Cardiac tamponade is a life-threatening condition. CAUSES  Often the cause of pericardial effusion is not known (idiopathic effusion). Possible causes are from:  Infections, such as from a virus, bacteria, fungus, or parasite.  Damage to the pericardium from heart surgery or a heart attack.  Inflammatory diseases, such as rheumatoid arthritis or lupus.  Kidney disease.  Thyroid disease.  Cancer.  Cancer treatment, including radiation or  chemotherapy.  Certain drugs, including tuberculosis drugs or seizure drugs.  Chest trauma. SIGNS AND SYMPTOMS  Pericardial effusion may not cause symptoms at first, especially if the fluid builds up slowly. In time, pressure on the heart may cause:  Chest pain.  Trouble breathing.  Pain and shortness of breath that is worse when lying down.  Dizziness.  Fainting.  Cough.  Hiccups.  Skipped heartbeats (palpitations).  Anxiety and confusion.  A bluish skin color (cyanosis).  Swollen legs and ankles. DIAGNOSIS  Your health care provider may suspect pericardial effusion based on your symptoms. Your health care provider may also do a physical exam to check for:  Low blood pressure and weak pulses.  Soft (muffled) heart sounds.  Rapid heartbeat.  Full veins in your neck (distended jugular veins).  Decreased breathing sounds and a rubbing sound (friction rub) when listening to your lungs. Your health care provider may also do several tests to confirm the diagnosis and find out what is causing the pericardial effusion. These may include:  Chest X-ray.  Imaging study of the heart using sound waves (echocardiogram).  CT scan or MRI.  Electrical study of the heart (electrocardiogram [ECG]).  A procedure using a needle to remove fluid from the pericardium for examination (pericardiocentesis).  Blood tests to check for:  Infection.  Heart damage.  Thyroid abnormalities.  Kidney disease.  Inflammatory disorders. TREATMENT  Treatment for  pericardial effusion depends on the cause of your symptoms and how severe your symptoms are. If a specific cause was found, that cause will be treated. Treatment may include:  Medicines, such as:  Nonsteroidal anti-inflammatory drugs (NSAIDs).  Other anti-inflammatory drugs, such as steroids.  Antibiotic medicine.  Antifungal medicine.  Hospital treatment may be necessary, such as for cardiac tamponade. This may  include:  Intravenous (IV) fluids.  Breathing support.  Surgery may be needed in severe cases. Surgery may include:  Pericardiocentesis.  Open heart surgery.  A procedure to make a permanent opening in the pericardium (pericardial window). SEEK MEDICAL CARE IF:  You feel dizzy or light-headed.  You have swelling in your legs or ankles.  You have heart palpitations.  You have persistent cough or hiccups. SEEK IMMEDIATE MEDICAL CARE IF:   You faint.  You have chest pain.  You have trouble breathing. These symptoms may represent a serious problem that is an emergency. Do not wait to see if the symptoms will go away. Get medical help right away. Call your local emergency services (911 in the [place] Do not drive yourself to the hospital. MAKE SURE YOU:  Understand these instructions.  Will watch your condition.  Will get help right away if you are not doing well or get worse.   This information is not intended to replace advice given to you by your health care provider. Make sure you discuss any questions you have with your health care provider.   Document Released: 09/09/2004 Document Revised: 02/02/2014 Document Reviewed: 06/01/2013 Elsevier Interactive Patient Education [employer].

## 2015-01-26 NOTE — Progress Notes (Signed)
    SUBJECTIVE:  SOB much improved,  Denies CP   PHYSICAL EXAM Filed Vitals:   01/25/15 1350 01/25/15 1932 01/25/15 2018 01/26/15 0500  BP: 123/55  116/52 129/62  Pulse: 70  60 82  Temp: 98.4 F (36.9 C)  98.6 F (37 C) 97.6 F (36.4 C)  TempSrc: Oral  Oral Oral  Resp: 20  18 18   Height:      Weight:    161 lb 1.6 oz (73.074 kg)  SpO2: 91% 93% 90% 93%   General:  No distress Lungs:  Clear Heart:  Irregular Abdomen:  Positive bowel sounds, no rebound no guarding Extremities:  Mild edema to the knees.    LABS: No results found for: TROPONINI No results found for this or any previous visit (from the past 24 hour(s)).  Intake/Output Summary (Last 24 hours) at 01/26/15 1110 Last data filed at 01/25/15 2330  Gross per 24 hour  Intake    720 ml  Output      0 ml  Net    720 ml    ECHO:  Compared to the prior study from 01/16/2015 (post pericardiocentesis TEE), there is a minimal pericardial effusion inferiorly to the LV and a mild effusion in the proximity ro the right atrium with no signs of hemodynamic compromise.  ASSESSMENT AND PLAN:  HCAP/ACUTE ASTHMATIC BRONCHITIS:  Day number 6 of antibiotics.  She has decreased sats with ambulation.  Home O2 has been arranged.  Seen by Dr Melvyn Novas this am.  Anticipate discharge to home today with close Pulmonary follow-up  ACUTE ON CHRONIC DIASTOLIC HF:   Home on [address] of [place].  However, she will need close follow up of her creatinine.  Needs TOC appt next week.   PERICARDIAL EFFUSION:  Status post window.   No residual effusion on echo.   CKD:  Creat stable yesterday.  Follow up as an outpatient.   ATRIAL FIB/HISTORY OF PE:   On warfarin.  Now on lower dig dose    Thompson Grayer 01/26/2015 11:10 AM

## 2015-01-26 NOTE — Discharge Summary (Signed)
Discharge Summary   Patient ID: [name] MRN: [id number], DOB/AGE: 79-May-1925 79 y.o. Admit date: 01/15/2015 D/C date:     01/26/2015  Primary Cardiologist: Dr. Sallyanne Kuster  Principal Problem:   Pericardial effusion, Large   Acute on chronic diastolic CHF (congestive heart failure), NYHA class 3 (HCC)   Acute bronchitis   HCAP (healthcare-associated pneumonia) Active Problems:   Chronic anticoagulation - Coumadin, CHADS2VASC=7   HTN (hypertension)   CVA (cerebral infarction)   Diabetes ([place])   History of CVA (cerebrovascular accident)   Upper airway cough syndrome   Obesity   Chronic atrial fibrillation [facility])    Admission Dates: 01/15/15- 01/26/15 Discharge Diagnosis: A/C diastolic CHF and pericardial effusion s/p pericardial window  HPI: [name] is a 81 y.o. female with a history of pericardial effusion (dx 2012), perm afib w/ MDT PPM 12/2013 for slow VR, chronic coumadin, CVA, GIB 2nd AVMs who presented to [facility] on 01/15/15 for evaluation of LE edema and SOB.   She saw Dr Servando Snare for the pericardial effusion, but got shingles and canceled the planned pericardial window. She presented with increased weight, LE edema and SOB. She is compliant with her Coumadin dosing and her INR was greater than 3 the last 2 times it was checked.   She was admitted for IV diuresis and CTCS was consulted. Coumadin was placed on hold and anticoagulation reversed with Vit K. She underwent successful subxiphiod pericardial window on 01/16/15 w/ drainage of 1100cc of light yellow fluid. Her chronic atrial fibrillation was rate controlled with rate controlled on digoxin and Verapamil; however, digoxin dose decreased (150mcg--> 62.5 mcg). Coumadin was resumed with heparin bridging (previous CVA). Her hospital course was further complicated by cough, wheeze, sputum, and dyspnea. PCCM was consulted. She was treated for HCAP as well as asthmatic bronchitis vs all pseudoasthma and acute upper  airway cough w/ PND. She was started on IV solumedrol --> switched to prednisone 30mg  on 01/25/15 and IV zosyn--> switched to Augmentin 01/25/15 500-125mg  BID on 01/25/15. She was also started on Flonase, albuterol and Brovana as well as protonix 40mg  BID. She was diuresed with IV lasix. Switched back to Torsemide 60mg  qd on 01/23/15. Potassium supplementation was discontinued due to some hyperkalemia. Creat was 1.45 on discharge with plans for outpatient BMET in 1 week with TOC appointment.    Hospital Course  HCAP/ACUTE ASTHMATIC BRONCHITIS/Upper airway cough w/ PND: Day number 6 of antibiotics. She has decreased sats with ambulation. Home O2 has been arranged. Seen by Dr Melvyn Novas this am. Anticipate discharge to home today with close Pulmonary follow-up. Will continue on Augmentin for 1 more day (she is on day 6/7) and prednisone with 1 week supply. She will need to see pulmonary next week sometime for further instructions. F/u CXR as outpt. Also placed on Flonase, PRN albuterol and Brovana as well as protonix 40mg  BID.   ACUTE ON CHRONIC DIASTOLIC [id number] with IV lasix. Discharge weight 161lbs. Home on 60 of Torsemide po qd.Potassium supplementation was discontinued due to some hyperkalemia while admitted. We will continue her previous home dosing at discharge (108 MeQ with a Torsemide.) However, she will need close follow up of her creatinine and K. Needs TOC appt next week w/ a BMET.   PERICARDIAL EFFUSION: Status post window. 01/16/15: Cultures negative, pathology/cytology nondiagnostic. No residual effusion on echo.   CKD: Creat stable yesterday at 1.45. Follow up as an outpatient. BMET in 1 week   ATRIAL FIB/HISTORY OF PE:On warfarin- will send  home on same dosing with follow up in the coumadin clinic on tuesday.INR 2.81 on 01/26/15. Continue on Verapamil CR 180mg . Now on lower dig dose (128mcg--> 62.5 mcg)   Discharge Vitals: Blood pressure 129/62, pulse 82, temperature 97.6 F  (36.4 C), temperature source Oral, resp. rate 18, height 5\' 5"  (1.651 m), weight 161 lb 1.6 oz (73.074 kg), SpO2 93 %.  Labs: Lab Results  Component Value Date   WBC 7.2 01/25/2015   HGB 11.5* 01/25/2015   HCT 36.9 01/25/2015   MCV 90.7 01/25/2015   PLT 267 01/25/2015     Recent Labs Lab 01/24/15 0358  NA 141  K 4.2  CL 99*  CO2 31  BUN 33*  CREATININE 1.45*  CALCIUM 9.3  GLUCOSE 165*   No results for input(s): CKTOTAL, CKMB, TROPONINI in the last 72 hours. Lab Results  Component Value Date   CHOL 160 08/03/2013   HDL 37* 08/03/2013   LDLCALC 100* 08/03/2013   TRIG 114 08/03/2013   No results found for: DDIMER  Diagnostic Studies/Procedures   Dg Chest 2 View  01/25/2015  CLINICAL DATA:  Shortness of breath, pneumonia. EXAM: CHEST  2 VIEW COMPARISON:  Chest x-rays dated 01/22/2015, 01/19/2015 and 01/17/2015. FINDINGS: Patchy opacities again seen at the right lung base, stable compared to 01/22/2015, new compared to the earlier study of 01/17/2015, suggesting right middle lobe and/or right lower lobe pneumonia. Suspect additional bibasilar atelectasis. Cardiomegaly is unchanged. Mild central pulmonary vascular congestion with associated mild bilateral interstitial edema is unchanged. No frank alveolar pulmonary edema. Right chest wall pacemaker/AICD is stable in position. Osseous and soft tissue structures about the chest are unremarkable. IMPRESSION: No appreciable change from most recent chest x-ray of 01/22/2015. Findings again suggest right middle lobe and/or right lower lobe pneumonia. Probable additional bibasilar atelectasis. Persistent mild pulmonary vascular congestion and interstitial edema suggesting mild CHF/volume overload. Electronically Signed   By: Franki Cabot M.D.   On: 01/25/2015 08:09   Dg Chest 2 View  01/22/2015  CLINICAL DATA:  Productive cough EXAM: CHEST  2 VIEW COMPARISON:  01/21/2015 FINDINGS: Patchy right middle lobe opacity, atelectasis versus  pneumonia. Mild bibasilar opacities, likely atelectasis. Small bilateral pleural effusions. Pulmonary vascular congestion without frank interstitial edema. Cardiomegaly.  Right subclavian pacemaker. Visualized osseous structures are within normal limits. IMPRESSION: Patchy right middle lobe opacity, atelectasis versus pneumonia. Mild bibasilar opacities, likely atelectasis. Pulmonary vascular congestion without frank interstitial edema. Small bilateral pleural effusions. Electronically Signed   By: Julian Hy M.D.   On: 01/22/2015 09:53   Dg Chest 2 View  01/19/2015  CLINICAL DATA:  Pericardial effusion. EXAM: CHEST  2 VIEW COMPARISON:  01/18/2015 FINDINGS: Dual lead cardiac pacemaker with battery within the right chest wall is stable. Inferior approach mediastinal or pericardial drain has been removed. Cardiomediastinal silhouette is stably enlarged. Linear opacities in bilateral lung bases may represent atelectasis. There is persistent mild interstitial pulmonary edema. Small bilateral pleural effusions cannot be excluded. No evidence of pneumothorax. Osseous structures are without acute abnormality. Soft tissues are grossly normal. IMPRESSION: Status post removal of mediastinal or pericardial drain. No evidence of pneumothorax or pneumomediastinum. Stably enlarged cardiac silhouette with mild pulmonary edema and bibasilar atelectasis. Small bilateral pleural effusions cannot be excluded. Electronically Signed   By: Fidela Salisbury M.D.   On: 01/19/2015 09:25   Dg Chest 2 View  01/15/2015  CLINICAL DATA:  Preoperative examination. EXAM: CHEST  2 VIEW COMPARISON:  07/17/2014; 01/13/2014; 06/11/2012; 07/01/2011; chest CT - 07/04/2011  FINDINGS: Grossly unchanged marked enlargement of the cardiac silhouette and mediastinal contours with atherosclerotic plaque within thoracic aorta. Stable positioning of support apparatus. There is chronic atelectasis/collapse of the right middle lobe. Chronic  pulmonary venous congestion without frank evidence of edema. There is persistent blunting of the bilobed costophrenic angles without definite pleural effusion. No pneumothorax. Unchanged bones. IMPRESSION: 1. Similar findings of marked cardiomegaly and pulmonary venous congestion without definite acute cardiopulmonary disease. 2. Chronic partial atelectasis/collapse of the right middle lobe, similar compared to the 06/2011 examinations. Electronically Signed   By: Sandi Mariscal M.D.   On: 01/15/2015 16:13   Dg Chest Port 1 View  01/21/2015  CLINICAL DATA:  Pneumonia. EXAM: PORTABLE CHEST 1 VIEW COMPARISON:  01/19/2015 and 01/18/2015. FINDINGS: 1245 hours. Right subclavian pacemaker leads appear unchanged within the right atrium and right ventricle. The heart is enlarged. There is aortic atherosclerosis, vascular congestion and bilateral pleural effusions. Associated bibasilar atelectasis is similar to the most recent examination. There is no pneumothorax. The bones appear unchanged. IMPRESSION: No significant change in cardiomegaly, vascular congestion, pleural effusions and bibasilar atelectasis. Electronically Signed   By: Richardean Sale M.D.   On: 01/21/2015 13:20   Dg Chest Port 1 View  01/18/2015  CLINICAL DATA:  Chest tube placement EXAM: PORTABLE CHEST 1 VIEW COMPARISON:  12/22/6 FINDINGS: There is a right chest wall pacer device with lead in the right atrial appendage and right ventricle. The heart size is enlarged. There is aortic atherosclerosis. Bilateral pleural effusions and interstitial edema is identified compatible with CHF. Chest tube overlying the left lower pericardial space noted. IMPRESSION: 1. Stable CHF. 2. Aortic atherosclerosis. Electronically Signed   By: Kerby Moors M.D.   On: 01/18/2015 08:05   Dg Chest Port 1 View  01/17/2015  CLINICAL DATA:  Chest tube, pericardial effusion. EXAM: PORTABLE CHEST 1 VIEW COMPARISON:  Chest radiograph from one day prior. FINDINGS: Stable  configuration of 3 lead right subclavian pacemaker and av surgical drain terminating over the left lower pericardial space. Stable cardiomediastinal silhouette with moderate cardiomegaly. No definite pneumothorax. Trace left pleural effusion, stable. Mild pulmonary edema appears slightly decreased. IMPRESSION: 1. Mild congestive heart failure, slightly improved. 2. Stable trace bilateral pleural effusions. Electronically Signed   By: Ilona Sorrel M.D.   On: 01/17/2015 07:44   Dg Chest Port 1 View  01/16/2015  CLINICAL DATA:  Postop thoracic surgery. Chest tube in place. Subxiphoid pericardial window procedure. Evaluate for pneumothorax. EXAM: PORTABLE CHEST 1 VIEW COMPARISON:  01/15/2015 FINDINGS: Evidence for a surgical drain in the left lower chest. Again noted is enlargement of the cardiac silhouette with prominent perihilar densities and peribronchial thickening. Right cardiac pacemaker is again noted and leads appear to be grossly stable. Negative for a pneumothorax. IMPRESSION: Evidence for a surgical drain in the left lower chest. Negative for pneumothorax. Enlarged central vascular structures with peribronchial thickening. Findings are suggestive for pulmonary edema. Electronically Signed   By: Markus Daft M.D.   On: 01/16/2015 10:35     2D ECHO: 01/24/2015 LV EF: 60% -  65% Study Conclusions - Left ventricle: The cavity size was normal. There was mild concentric hypertrophy. Systolic function was normal. The estimated ejection fraction was in the range of 60% to 65%. Wall motion was normal; there were no regional wall motion abnormalities. - Aortic valve: Valve mobility was restricted. Transvalvular velocity was minimally increased. There was mild stenosis. There was mild regurgitation. Valve area (VTI): 1.44 cm^2. Valve area (Vmax): 1.45 cm^2. Valve  area (Vmean): 1.41 cm^2. - Mitral valve: Calcified annulus. Mildly thickened leaflets . - Left atrium: The atrium was  severely dilated. - Right atrium: The appendage was moderately dilated. - Pulmonary arteries: Systolic pressure was moderately increased. PA peak pressure: 45 mm Hg (S). - Inferior vena cava: The vessel was dilated. The respirophasic diameter changes were blunted (< 50%), consistent with elevated central venous pressure. - Pericardium, extracardiac: A trivial pericardial effusion was identified posterior to the heart. Features were not consistent with tamponade physiology. Impressions: - Compared to the prior study from 01/16/2015 (post pericardiocentesis TEE), there is a minimal pericardial effusion inferiorly to the LV and a mild effusion in the proximity ro the right atrium with no signs of hemodynamic compromise.    Discharge Medications     Medication List    TAKE these medications        acetaminophen-codeine 300-30 MG tablet  Commonly known as:  TYLENOL #3  TAKE 1 OR 2 TABLETS BY MOUTH EVERY 6 HOURS     albuterol (2.5 MG/3ML) 0.083% nebulizer solution  Commonly known as:  PROVENTIL  Take 3 mLs (2.5 mg total) by nebulization every 2 (two) hours as needed for wheezing or shortness of breath.     amoxicillin-clavulanate 500-125 MG tablet  Commonly known as:  AUGMENTIN  Take 1 tablet (500 mg total) by mouth 2 (two) times daily.     arformoterol 15 MCG/2ML Nebu  Commonly known as:  BROVANA  Take 2 mLs (15 mcg total) by nebulization 2 (two) times daily.     budesonide 0.25 MG/2ML nebulizer solution  Commonly known as:  PULMICORT  Take 2 mLs (0.25 mg total) by nebulization 2 (two) times daily.     codeine 15 MG tablet  Take 1 tablet (15 mg total) by mouth every 6 (six) hours as needed.     Digoxin 62.5 MCG Tabs  Commonly known as:  DIGITEK  Take 0.0625 mg by mouth daily.     fluticasone 50 MCG/ACT nasal spray  Commonly known as:  FLONASE  Place 2 sprays into both nostrils 2 (two) times daily.     pantoprazole 40 MG tablet  Commonly known as:   PROTONIX  Take 1 tablet (40 mg total) by mouth 2 (two) times daily before a meal.     potassium chloride 10 MEQ tablet  Commonly known as:  K-DUR,KLOR-CON  take 2 tablets by mouth once daily WHEN YOU TAKE TORSEMIDE     predniSONE 10 MG tablet  Commonly known as:  DELTASONE  Take 3 tablets (30 mg total) by mouth daily with breakfast.     senna 8.6 MG Tabs tablet  Commonly known as:  SENOKOT  Take 1 tablet by mouth at bedtime.     torsemide 20 MG tablet  Commonly known as:  DEMADEX  Take 3 tablets (60 mg total) by mouth daily.     valACYclovir 1000 MG tablet  Commonly known as:  VALTREX  Take 1 tablet (1,000 mg total) by mouth 3 (three) times daily.     verapamil 180 MG CR tablet  Commonly known as:  CALAN-SR  Take 1 tablet (180 mg total) by mouth at bedtime.     warfarin 5 MG tablet  Commonly known as:  COUMADIN  Take 1 tablet by mouth daily or as directed by coumadin clinic        Disposition   The patient will be discharged in stable condition to home. Discharge Instructions    AMB Referral to  [facility] Care Management    Complete by:  As directed   Please assign to [place] for transition of care. Patient to stay with her daughter [name] at [phone] post discharge. Daughter lives in [place]. Consents signed. Please call with questions. Marthenia Rolling, MSN-Ed, RN,BSN- [facility] [id number]  Reason for consult:  Please assign to [facility] RNCM  Diagnoses of:  Heart Failure  Expected date of contact:  1-3 days (reserved for hospital discharges)          Follow-up Information    Follow up with Grace Isaac, MD On 02/14/2015.   Specialty:  Cardiothoracic Surgery   Why:  Appointment is at 4:00    Contact information:   [place] [address] [phone]       Follow up with [facility].   Why:  Oxygen   Contact information:   [address] [place] [address] [phone]        Follow up with [facility], NP.   Specialty:  Pulmonary Disease   Why:  You need to be seen next week for follow up chest x ray and further instructions on prednisone   Contact information:   [address] [address] [phone]       Follow up with Tarri Fuller, PA-C On 02/01/2015.   Specialties:  Physician Assistant, Radiology, Interventional Cardiology   Why:  The office will call you to make an appoinment., If you do not hear from them, please contact them. You shold be seen on 02/01/15. you will also need labs at that time.    Contact information:   [address] [place] [address] [phone]       Follow up with [facility] On 01/29/2015.   Why:  The office will call you to make an appoinment in the coumadin clinic   Contact information:   [address] [place] [phone]        Duration of Discharge Encounter: Greater than 30 minutes including physician and PA time.  Signed, [place]ville Silos, KATHRYN R PA-C 01/26/2015, 2:22 PM   Thompson [email or web address].MD

## 2015-01-26 NOTE — Progress Notes (Signed)
Pt up ambulating in hall Sa02 between 90 - 93%, Md aware oxygen not needed, advanced home care scheduled to pick up home 02.  Edward Qualia RN

## 2015-01-28 ENCOUNTER — Other Ambulatory Visit: Payer: Self-pay | Admitting: Cardiovascular Disease

## 2015-01-29 ENCOUNTER — Other Ambulatory Visit: Payer: Self-pay | Admitting: *Deleted

## 2015-01-29 ENCOUNTER — Encounter (INDEPENDENT_AMBULATORY_CARE_PROVIDER_SITE_OTHER): Payer: Self-pay

## 2015-01-29 DIAGNOSIS — I319 Disease of pericardium, unspecified: Secondary | ICD-10-CM

## 2015-01-29 LAB — POCT INR: INR: 2.2

## 2015-01-29 NOTE — Patient Outreach (Signed)
[facility]) Care Management  01/29/2015  KLIYAH SHERIN May 25, 1923 [id number]   Transition of care (week 1)  RN spoke with pt's caregiver (daughter-Donna Tavares) who indicates pt will be recovering at her home at this time. Transition of care template completed as caregiver indicated no pending appointment for follow up at the pt's primary care doctor's office. RN inquired if pt could contact Dr. Perfecto Kingdom as inquire on recent discharge from the hospital for possible post hospital office visit (daughter indicated she would and ask the pt on a specific date and time). RN reviewed all follow up appointments noted in EPIC. RN also discussed HF and pt's knowledge on daily monitoring.Daughter states pt does not monitor and has not weighed since discharged home. Daughter did not know the importance of daily weight although she verified the HF packet upon pt's discharge. RN briefly discussed the HF zones and GREEN sone with contact to the provider if the pt gaines 3 lbs overnight or 5 lbs within one week with any precipitating symptoms (caregiver verbalized an understanding). Daughter indicated she would discuss all information with the pt and verified a date and time pt wishes to visit with her provider since her recent discharge from the hospital. Daughter also verified that she would obtain a scale today and start with pt's daily weights in monitoring her HF. RN extended to further assist with home visits in assisting with further education and managing the pt's HF. Daughter was receptive and a schedule home visit was arranged for this Thursday. Will follow up accordingly and completed the initial assessment.  Raina Mina, RN Care Management Coordinator [place] Office [phone]

## 2015-01-30 ENCOUNTER — Ambulatory Visit (INDEPENDENT_AMBULATORY_CARE_PROVIDER_SITE_OTHER): Payer: Medicare Other | Admitting: Pharmacist Clinician (PhC)/ Clinical Pharmacy Specialist

## 2015-01-30 ENCOUNTER — Other Ambulatory Visit: Payer: Self-pay | Admitting: Cardiovascular Disease

## 2015-01-30 ENCOUNTER — Telehealth: Payer: Self-pay | Admitting: Internal Medicine

## 2015-01-30 DIAGNOSIS — I6310 Cerebral infarction due to embolism of unspecified precerebral artery: Secondary | ICD-10-CM

## 2015-01-30 DIAGNOSIS — Z7901 Long term (current) use of anticoagulants: Secondary | ICD-10-CM

## 2015-01-30 DIAGNOSIS — I480 Paroxysmal atrial fibrillation: Secondary | ICD-10-CM

## 2015-01-30 MED ORDER — WARFARIN SODIUM 5 MG PO TABS: ORAL_TABLET | ORAL | Status: DC

## 2015-01-30 NOTE — Telephone Encounter (Signed)
Spoke with pt's daughter, states that her mother believes she might need oxygen and wants to have this ordered.  I advised that pt has not been seen in our office before and that this could be discussed on pt's 02/04/15 ov with MW.  Pt's daughter expressed understanding.  Nothing further needed.

## 2015-01-31 ENCOUNTER — Other Ambulatory Visit: Payer: Self-pay | Admitting: *Deleted

## 2015-01-31 NOTE — Patient Outreach (Signed)
[facility]) Care Management   01/31/2015  [name] March 05, 1923 [id number]  [name] is an 82 y.o. 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 Pt adherent with daily monitoring and just started weighing based upon RN's request a few days ago. Pt unaware of the specifics on HF symptoms and when to seek medical attention early to prevent possible hospitalization. Pt receptive to the teachings and educational programs provided today and verbralized an understanding.   MEDICATIONS: Pt and daughter indicate one medication she did not have and was not taking at this time. Pt indicated it was her nebulizer medication. Pt also indicated she has purchase her nebulizer device OTC unaware that she could have gone through a [place] for durable medical equipment with insurance co-pay.  SWELLING:  Pt states she has a history of swelling to her lower legs and her doctor is aware. States she has some swelling today but it's much improved. Pt confirms she is taking fluid medication with no problems. Pt reports she has tried compression stockings in the past but not comfortable and the folds that appear in the stockings once applied irritates her skin. Therefore pt has been elevating her lower legs to reduce ongoing swelling however a low process. Pt aware of the importance of reducing the swelling and aware of other resources for a more comfortable wear compression stockings from a company called [employer] who she has uses in the past. RN offered to re-measure pt for possible new compression stockings based upon her current swelling size however pt declined indicating she has a doctor's appointment tomorrow and will request her provider to measure for possible new compression stockings.   Objective:   Review of Systems   Constitutional: Negative.   HENT: Negative.   Eyes: Negative.   Respiratory: Negative.   Cardiovascular: Negative.   Gastrointestinal: Negative.   Genitourinary: Negative.   Musculoskeletal: Negative.   Skin: Negative.   Neurological: Negative.   Endo/Heme/Allergies: Negative.   Psychiatric/Behavioral: Negative.     Physical Exam  Constitutional: She is oriented to person, place, and time. She appears well-developed and well-nourished.  HENT:  Right Ear: External ear normal.  Left Ear: External ear normal.  Eyes: EOM are normal.  Neck: Normal range of motion.  Cardiovascular: Normal heart sounds.   Respiratory: Effort normal and breath sounds normal.  GI: Soft. Bowel sounds are normal.  Musculoskeletal: She exhibits edema.  Lower extremities with 3+  Neurological: She is alert and oriented to person, place, and time.  Skin: Skin is warm and dry.  Psychiatric: She has a normal mood and affect. Her behavior is normal. Judgment and thought content normal.    Current Medications:   Current Outpatient Prescriptions  Medication Sig Dispense Refill  . acetaminophen-codeine (TYLENOL #3) 300-30 MG tablet TAKE 1 OR 2 TABLETS BY MOUTH EVERY 6 HOURS 100 tablet 0  . albuterol (PROVENTIL) (2.5 MG/3ML) 0.083% nebulizer solution Take 3 mLs (2.5 mg total) by nebulization every 2 (two) hours as needed for wheezing or shortness of breath. 75 mL 12  . amoxicillin-clavulanate (AUGMENTIN) 500-125 MG tablet Take 1 tablet (500 mg total) by mouth 2 (two) times daily. 3 tablet 0  . arformoterol (BROVANA) 15 MCG/2ML NEBU Take 2 mLs (15 mcg total) by nebulization 2 (two) times daily. 120 mL 0  . budesonide (  PULMICORT) 0.25 MG/2ML nebulizer solution Take 2 mLs (0.25 mg total) by nebulization 2 (two) times daily. 60 mL 0  . codeine 15 MG tablet Take 1 tablet (15 mg total) by mouth every 6 (six) hours as needed. 30 tablet 0  . Digoxin 62.5 MCG TABS Take 0.0625 mg by mouth daily. 30 tablet 6  .  fluticasone (FLONASE) 50 MCG/ACT nasal spray Place 2 sprays into both nostrils 2 (two) times daily. 16 g 2  . pantoprazole (PROTONIX) 40 MG tablet Take 1 tablet (40 mg total) by mouth 2 (two) times daily before a meal. 60 tablet 11  . potassium chloride (K-DUR,KLOR-CON) 10 MEQ tablet take 2 tablets by mouth once daily WHEN YOU TAKE TORSEMIDE (Patient taking differently: TAKE 20 MEQ BY MOUTH DAILY) 180 tablet 2  . predniSONE (DELTASONE) 10 MG tablet Take 3 tablets (30 mg total) by mouth daily with breakfast. 7 tablet 0  . senna (SENOKOT) 8.6 MG TABS tablet Take 1 tablet by mouth at bedtime.    . torsemide (DEMADEX) 20 MG tablet Take 3 tablets (60 mg total) by mouth daily. 180 tablet 6  . valACYclovir (VALTREX) 1000 MG tablet Take 1 tablet (1,000 mg total) by mouth 3 (three) times daily. 30 tablet 0  . verapamil (CALAN-SR) 180 MG CR tablet TAKE 1 TABLET(S) BY MOUTH EVERY NIGHT AT BEDTIME 30 tablet 6  . warfarin (COUMADIN) 5 MG tablet Take 1 tablet by mouth daily or as directed by coumadin clinic 90 tablet 1   No current facility-administered medications for this visit.    Functional Status:   In your present state of health, do you have any difficulty performing the following activities: 01/31/2015 01/15/2015  Hearing? [place]? N -  Difficulty concentrating or making decisions? N -  Walking or climbing stairs? Y -  Dressing or bathing? N -  Doing errands, shopping? [place] Don[place]  Preparing Food and eating ? N -  Using the Toilet? N -  In the past six months, have you accidently leaked urine? N -  Do you have problems with loss of bowel control? N -  Managing your Medications? Y -  Managing your Finances? Y -  Housekeeping or managing your Housekeeping? Y -    Fall/Depression Screening:    PHQ 2/9 Scores 01/31/2015 12/21/2014 12/16/2014 07/17/2014 06/29/2014 03/28/2013  PHQ - 2 Score 0 0 0 0 0 0   Filed Vitals:   01/31/15 1101  BP: 118/58  Pulse: 64  Resp: 20   Assessment:   THN enrollment  relate to the [facility] program and services Case Management related to HF Medication adherence related to prescribed medications Bilateral edema related to Lower extremities  Plan: Will verified a signs consent and via recent hospital discharge and verified pt ongoing interest in [facility] services (verified by pt). Physical assessment completed with not acute symptoms noted today. Will began teachings on HF and discussed details on the importance of daily monitoring and obtain her weight everyday. Will discussed the HF zones and educate pt on if she gains 3 lbs in one day or 5 lbs within one week with any precipitating symptoms pt now aware to contact her provider for intervention to prevent possible readmission. Will provide education information related to HF with the following EMMI programs: HF: Keeping track of your weight each day, HF: When to call your doctor or 911 and HF: How to be salt smart. All information reviewed with pt having an understanding of the information provided. Pt  also provided a [facility] calendar and encouraged to document all her weights and readings along with any upcoming appointments to better prepare her in managing her medical conditions.Will obtain any weights pt has performed since her recent discharge over the last few days and start her documentation in her calender (only available weights were today's 160.2lbs  and yesterday's 161 lbs).  Will review all medications for pt's knowledge on purpose of all her medications and verified pt has all supplies and taking accordingly to recommendations form her providers. Due to one medications pt indicated she did not have RN contacted her pharmacy and verified pt has indeed picked up this medication and may be confused that it is a generic medications. RN will verify pt has this medications and educate accordingly on this drug as the generic version (nebulizer medication). All inquired related to her medication again review and verified pt's  understanding of all her medications and the purpose.  Will strongly encouraged elevation of her bilateral lower legs and present education on compression stockings and offer accordingly. Pt understands the importance of what will happen if she does not elevate her lower legs with ongoing swelling and how this may affect her breathing and HF exacerbation symptoms may occur.  Will offered measurements for possible supplies however pt declined measures of lower legs indicated she will have her provider to measures on tomorrow's appointment. Will inform pt that [facility] can provide compression stockings if needed in the future.  Plan of care discussed along with measurable goals as pt receptive and will comply. Will schedule next home visit and continue with the above plan of care and adjust according to pt's progress.  Raina Mina, RN Care Management Coordinator [place] Office [phone]

## 2015-02-01 ENCOUNTER — Telehealth: Payer: Self-pay | Admitting: *Deleted

## 2015-02-01 ENCOUNTER — Ambulatory Visit (INDEPENDENT_AMBULATORY_CARE_PROVIDER_SITE_OTHER): Payer: Medicare Other | Admitting: Emergency Medicine

## 2015-02-01 ENCOUNTER — Other Ambulatory Visit: Payer: Self-pay | Admitting: Emergency Medicine

## 2015-02-01 VITALS — BP 124/66 | HR 82 | Temp 97.4°F | Resp 18 | Ht 63.5 in | Wt 162.2 lb

## 2015-02-01 DIAGNOSIS — R601 Generalized edema: Secondary | ICD-10-CM | POA: Diagnosis not present

## 2015-02-01 DIAGNOSIS — R110 Nausea: Secondary | ICD-10-CM

## 2015-02-01 DIAGNOSIS — R739 Hyperglycemia, unspecified: Secondary | ICD-10-CM

## 2015-02-01 DIAGNOSIS — I482 Chronic atrial fibrillation, unspecified: Secondary | ICD-10-CM

## 2015-02-01 LAB — BASIC METABOLIC PANEL WITH GFR
BUN: 35 mg/dL — AB (ref 7–25)
CALCIUM: 10.2 mg/dL (ref 8.6–10.4)
CO2: 36 mmol/L — ABNORMAL HIGH (ref 20–31)
Chloride: 93 mmol/L — ABNORMAL LOW (ref 98–110)
Creat: 1.09 mg/dL — ABNORMAL HIGH (ref 0.60–0.88)
GFR, EST AFRICAN AMERICAN: 52 mL/min — AB (ref >=60)
GFR, Est Non African American: 45 mL/min — ABNORMAL LOW (ref >=60)
GLUCOSE: 154 mg/dL — AB (ref 65–99)
POTASSIUM: 4.9 mmol/L (ref 3.5–5.3)
Sodium: 140 mmol/L (ref 135–146)

## 2015-02-01 NOTE — Telephone Encounter (Signed)
Lab results called to patient and med instructions given to patient.  Voiced understanding.

## 2015-02-01 NOTE — Telephone Encounter (Signed)
-----   Message from Sanda Klein, MD sent at 02/01/2015 11:44 AM EST ----- Please reduce digoxin to 5 days a week only. Skip Sunday and Wednesday.

## 2015-02-01 NOTE — Addendum Note (Signed)
Addended by: Burnis Kingfisher on: 02/01/2015 03:29 PM   Modules accepted: Orders

## 2015-02-01 NOTE — Progress Notes (Signed)
By signing my name below, I, Raven Small, attest that this documentation has been prepared under the direction and in the presence of [name], MD.  Electronically Signed: Thea Alken, ED Scribe. 02/01/2015. 1:45 PM.   Chief Complaint:  Chief Complaint  Patient presents with  . Follow-up    HPI: [name] is a 82 y.o. female who reports to [facility] today for a hospital f/u. Pt underwent subxiphoid pericardial window 12/21 and was hospitalized for 10 days. Recently discharged 6 days ago.   Pt states her breathing has been okay. A medication that she is taking has been making nauseous. She believes it may be prednisone but took her last dose this morning. She unintentionally took an extra dose of Digoxin this morning. She has been using her nebulizer daily. She would like a medication change from codeine to dilaudid but has to discuss this with her cardiologist. She has had swelling in lower legs bilaterally. States she has been keeping them elevated. He next appointment with cardiology 1/19. She denies having heartburn.   Pt is a retired [employer] scientist.   Past Medical History  Diagnosis Date  . Pericardial effusion     a. s/p pericardial window 01/16/15  . Atrial fibrillation (HCC)     Chronic  . Chronic anticoagulation     on Coumadin  . History of GI bleed     once treated with Fe infusion  . HTN (hypertension)   . Pacemaker     a. Medtronic, placed for tachybrady.   . Obesity   . LVH (left ventricular hypertrophy)   . Sleep apnea     mild-no cpap  . Positive TB test   . Anemia   . Stroke [facility])     a. 2013: right frontal  . Arthritis   . Tachycardia-bradycardia syndrome [facility])     a. s/p Medtronic Sensia  model number [id number], serial number [id number] H 12/2013  . History of shingles   . Chronic diastolic (congestive) heart failure ([place])   . Chronic atrial fibrillation (HCC)     a. on coumadin, digoxin and calan  . GI bleed     a. 2/2 AVMs   Past Surgical History    Procedure Laterality Date  . Breast lumpectomy Bilateral     negative for cancer  . Esophagogastroduodenoscopy  05/19/2011    Procedure: ESOPHAGOGASTRODUODENOSCOPY (EGD);  Surgeon: Lear Ng, MD;  Location: [facility] ENDOSCOPY;  Service: Endoscopy;  Laterality: N/A;  doctor aware of inr   will try to be here no later than 230  . Cholecystectomy  2005  . Esophagogastroduodenoscopy  06/22/2011    Procedure: ESOPHAGOGASTRODUODENOSCOPY (EGD);  Surgeon: Arta Silence, MD;  Location: [facility] ENDOSCOPY;  Service: Endoscopy;  Laterality: N/A;  Check PT/INR in am  . Givens capsule study  06/23/2011    Procedure: GIVENS CAPSULE STUDY;  Surgeon: Arta Silence, MD;  Location: [facility] ENDOSCOPY;  Service: Endoscopy;  Laterality: N/A;  . Colonoscopy  08/11/2011    Procedure: COLONOSCOPY;  Surgeon: Jeryl Columbia, MD;  Location: [facility] ENDOSCOPY;  Service: Endoscopy;  Laterality: N/A;  . Hot hemostasis  08/11/2011    Procedure: HOT HEMOSTASIS (ARGON PLASMA COAGULATION/BICAP);  Surgeon: Jeryl Columbia, MD;  Location: [facility] ENDOSCOPY;  Service: Endoscopy;  Laterality: N/A;  . Mass excision Left 09/14/2013    Procedure: EXCISION MASS LEFT WRIST;  Surgeon: Leanora Cover, MD;  Location: [place];  Service: Orthopedics;  Laterality: Left;  . Tonsillectomy and adenoidectomy  1950  .  Insert / replace / remove pacemaker  2001    Last generator in 2006; interrogated Dec 2012  . Cataract extraction w/ intraocular lens  implant, bilateral Bilateral   . Lipoma excision Left 07/2013    wrist  . Pacemaker generator change N/A 01/02/2014    Procedure: PACEMAKER GENERATOR CHANGE;  Surgeon: Sanda Klein, MD;  Location: [place] CATH LAB;  Service: Cardiovascular;  Laterality: N/A;  . Lead revision N/A 01/02/2014    Procedure: LEAD REVISION;  Surgeon: Sanda Klein, MD;  Location: [place] CATH LAB;  Service: Cardiovascular;  Laterality: N/A;  . Subxyphoid pericardial window N/A 01/16/2015    Procedure: SUBXYPHOID PERICARDIAL WINDOW;   Surgeon: Grace Isaac, MD;  Location: [place];  Service: Thoracic;  Laterality: N/A;  . Tee without cardioversion N/A 01/16/2015    Procedure: TRANSESOPHAGEAL ECHOCARDIOGRAM (TEE);  Surgeon: Grace Isaac, MD;  Location: [place];  Service: Thoracic;  Laterality: N/A;   Social History   Social History  . Marital Status: Widowed    Spouse Name: N/A  . Number of Children: N/A  . Years of Education: N/A   Social History Main Topics  . Smoking status: Never Smoker   . Smokeless tobacco: Never Used  . Alcohol Use: 0.0 oz/week    0 Standard drinks or equivalent per week     Comment: 01/02/2014 "I'll have a drink a few times/year"  . Drug Use: No  . Sexual Activity: No   Other Topics Concern  . None   Social History Narrative   Family History  Problem Relation Age of Onset  . Stroke Mother   . Stroke Father   . Pneumonia Father    Allergies  Allergen Reactions  . Iodinated Diagnostic Agents Hives    HIVES 15MIN S/P IV CONTRAST INJECTION,WILL NEED 13 HR PREP FOR FUTURE INJECTIONS, ok s/p 50mg  po benadryl//a.calhoun  . Anti-Inflammatory Enzyme [Nutritional Supplements]     Retains fluids and headaches  . Arthrotec [Diclofenac-Misoprostol] Other (See Comments)    unknown  . Biaxin [Clarithromycin] Other (See Comments)    unknown  . Plavix [Clopidogrel Bisulfate] Other (See Comments)    unknown  . Spironolactone     Hair loss   Prior to Admission medications   Medication Sig Start Date End Date Taking? Authorizing Provider  acetaminophen-codeine (TYLENOL #3) 300-30 MG tablet TAKE 1 OR 2 TABLETS BY MOUTH EVERY 6 HOURS 01/24/15   Leandrew Koyanagi, MD  albuterol (PROVENTIL) (2.5 MG/3ML) 0.083% nebulizer solution Take 3 mLs (2.5 mg total) by nebulization every 2 (two) hours as needed for wheezing or shortness of breath. 01/26/15   Eileen Stanford, PA-C  amoxicillin-clavulanate (AUGMENTIN) 500-125 MG tablet Take 1 tablet (500 mg total) by mouth 2 (two) times daily. 01/26/15    Eileen Stanford, PA-C  arformoterol (BROVANA) 15 MCG/2ML NEBU Take 2 mLs (15 mcg total) by nebulization 2 (two) times daily. 01/26/15   Eileen Stanford, PA-C  budesonide (PULMICORT) 0.25 MG/2ML nebulizer solution Take 2 mLs (0.25 mg total) by nebulization 2 (two) times daily. 01/26/15   Eileen Stanford, PA-C  codeine 15 MG tablet Take 1 tablet (15 mg total) by mouth every 6 (six) hours as needed. 07/17/14   Darlyne Russian, MD  Digoxin 62.5 MCG TABS Take 0.0625 mg by mouth daily. 01/26/15   Eileen Stanford, PA-C  fluticasone (FLONASE) 50 MCG/ACT nasal spray Place 2 sprays into both nostrils 2 (two) times daily. 01/26/15   Eileen Stanford, PA-C  pantoprazole ([place])  40 MG tablet Take 1 tablet (40 mg total) by mouth 2 (two) times daily before a meal. 01/26/15   Eileen Stanford, PA-C  potassium chloride (K-DUR,KLOR-CON) 10 MEQ tablet take 2 tablets by mouth once daily WHEN YOU TAKE TORSEMIDE Patient taking differently: TAKE 20 MEQ BY MOUTH DAILY 08/13/14   Mihai Croitoru, MD  predniSONE (DELTASONE) 10 MG tablet Take 3 tablets (30 mg total) by mouth daily with breakfast. 01/26/15   Eileen Stanford, PA-C  senna (SENOKOT) 8.6 MG TABS tablet Take 1 tablet by mouth at bedtime.    Historical Provider, MD  torsemide (DEMADEX) 20 MG tablet Take 3 tablets (60 mg total) by mouth daily. 01/26/15   Eileen Stanford, PA-C  valACYclovir (VALTREX) 1000 MG tablet Take 1 tablet (1,000 mg total) by mouth 3 (three) times daily. 12/16/14   Leandrew Koyanagi, MD  verapamil (CALAN-SR) 180 MG CR tablet TAKE 1 TABLET(S) BY MOUTH EVERY NIGHT AT BEDTIME 01/29/15   Mihai Croitoru, MD  warfarin (COUMADIN) 5 MG tablet Take 1 tablet by mouth daily or as directed by coumadin clinic 01/30/15   Dani Gobble Croitoru, MD     ROS: The patient denies fevers, chills, night sweats, unintentional weight loss, chest pain, palpitations, wheezing, dyspnea on exertion, nausea, vomiting, abdominal pain, dysuria, hematuria, melena,  numbness, weakness, or tingling.   All other systems have been reviewed and were otherwise negative with the exception of those mentioned in the HPI and as above.    PHYSICAL EXAM: Filed Vitals:   02/01/15 1322  BP: 124/66  Pulse: 82  Temp: 97.4 F (36.3 C)  Resp: 18   Body mass index is 28.28 kg/(m^2).   General: Alert, Elderly female. Does not appear to be in distress.  HEENT:  Normocephalic, atraumatic, oropharynx patent.  Eye: Juliette Mangle [facility] Cardiovascular:  Regular rate and rhythm, no rubs murmurs or gallops.  No Carotid bruits, radial pulse intact. No pedal edema. Irregular heart beat. grade 2 murmur.  Respiratory: Clear to auscultation bilaterally.  No wheezes, rales, or rhonchi.  No cyanosis, no use of accessory musculature Abdominal: No organomegaly, abdomen is soft and non-tender, positive bowel sounds.  Healing scar with no tenderness or masses Musculoskeletal: Gait intact. 4+ edema both legs.  Skin: No rashes. Neurologic: Facial musculature symmetric. Psychiatric: Patient acts appropriately throughout our interaction. Lymphatic: No cervical or submandibular lymphadenopathy    LABS:    EKG/XRAY:   Primary read interpreted by Dr. Everlene Farrier at [facility].   ASSESSMENT/PLAN: Patient complaining nausea. She denies any heartburn or dyspepsia. Will stop her Protonix. She had spoken with Dr. Sallyanne Kuster  who recommended she decrease her Lanoxin. Dig level was done today and I will forward those results to Dr. Sallyanne Kuster. She looked well today. She has severe bilateral 4+ edema of both legs. She cannot wear compression hose. I wrapped both legs in Ace wrap's and she will keep them elevated as much as possible.I personally performed the services described in this documentation, which was scribed in my presence. The recorded information has been reviewed and is accurate.   Gross sideeffects, risk and benefits, and alternatives of medications d/w patient. Patient is aware that all  medications have potential sideeffects and we are unable to predict every sideeffect or drug-drug interaction that may occur.  [name] MD 02/01/2015 1:23 PM

## 2015-02-01 NOTE — Telephone Encounter (Signed)
Instructed to decrease digoxin to 5 days a week - skip Sundays and Wednesdays.  Patient and daughter voiced understanding.

## 2015-02-01 NOTE — Patient Instructions (Addendum)
Please stop your protonix I will call you with your digoxin results. Please keep all of your follow-up appointments.

## 2015-02-02 ENCOUNTER — Other Ambulatory Visit: Payer: Self-pay | Admitting: *Deleted

## 2015-02-02 LAB — DIGOXIN LEVEL: Digoxin Level: 1.2 ug/L (ref 0.8–2.0)

## 2015-02-04 ENCOUNTER — Inpatient Hospital Stay: Payer: Medicare Other | Admitting: Internal Medicine

## 2015-02-04 ENCOUNTER — Telehealth: Payer: Self-pay | Admitting: Physician Assistant

## 2015-02-05 ENCOUNTER — Ambulatory Visit (INDEPENDENT_AMBULATORY_CARE_PROVIDER_SITE_OTHER): Payer: Medicare Other | Admitting: Pharmacist Clinician (PhC)/ Clinical Pharmacy Specialist

## 2015-02-05 DIAGNOSIS — I480 Paroxysmal atrial fibrillation: Secondary | ICD-10-CM

## 2015-02-05 DIAGNOSIS — I6310 Cerebral infarction due to embolism of unspecified precerebral artery: Secondary | ICD-10-CM

## 2015-02-05 DIAGNOSIS — Z7901 Long term (current) use of anticoagulants: Secondary | ICD-10-CM

## 2015-02-05 LAB — POCT INR: INR: 3.4

## 2015-02-05 NOTE — Telephone Encounter (Signed)
Close encounter 

## 2015-02-06 ENCOUNTER — Ambulatory Visit: Payer: Medicare Other | Admitting: Physician Assistant

## 2015-02-07 ENCOUNTER — Ambulatory Visit (INDEPENDENT_AMBULATORY_CARE_PROVIDER_SITE_OTHER): Payer: Medicare Other | Admitting: Internal Medicine

## 2015-02-07 ENCOUNTER — Encounter: Payer: Self-pay | Admitting: Internal Medicine

## 2015-02-07 ENCOUNTER — Ambulatory Visit (INDEPENDENT_AMBULATORY_CARE_PROVIDER_SITE_OTHER)
Admission: RE | Admit: 2015-02-07 | Discharge: 2015-02-07 | Disposition: A | Discharge status: Home / Self Care | Payer: Medicare Other | Source: Ambulatory Visit | Attending: Internal Medicine | Admitting: Internal Medicine

## 2015-02-07 VITALS — BP 122/52 | HR 82 | Ht 63.0 in | Wt 157.8 lb

## 2015-02-07 DIAGNOSIS — J189 Pneumonia, unspecified organism: Secondary | ICD-10-CM

## 2015-02-07 DIAGNOSIS — R05 Cough: Secondary | ICD-10-CM | POA: Diagnosis not present

## 2015-02-07 DIAGNOSIS — R058 Other specified cough: Secondary | ICD-10-CM

## 2015-02-07 DIAGNOSIS — I6310 Cerebral infarction due to embolism of unspecified precerebral artery: Secondary | ICD-10-CM

## 2015-02-07 NOTE — Progress Notes (Signed)
Subjective:     Patient ID: [name], female   DOB: [birth year]    MRN: [id number]  HPI  82 yowf never smoker s/p admit:    Admission Dates: 01/15/15- 01/26/15 Discharge Diagnosis: A/C diastolic CHF and pericardial effusion s/p pericardial window  HPI: [name] is a 82 y.o. female with a history of pericardial effusion (dx 2012), perm afib w/ MDT PPM 12/2013 for slow VR, chronic coumadin, CVA, GIB 2nd AVMs who presented to [facility] on 01/15/15 for evaluation of LE edema and SOB.   She saw Dr Servando Snare for the pericardial effusion, but got shingles and canceled the planned pericardial window. She presented with increased weight, LE edema and SOB. She is compliant with her Coumadin dosing and her INR was greater than 3 the last 2 times it was checked.   She was admitted for IV diuresis and CTCS was consulted. Coumadin was placed on hold and anticoagulation reversed with Vit K. She underwent successful subxiphiod pericardial window on 01/16/15 w/ drainage of 1100cc of light yellow fluid. Her chronic atrial fibrillation was rate controlled with rate controlled on digoxin and Verapamil; however, digoxin dose decreased (166mcg--> 62.5 mcg). Coumadin was resumed with heparin bridging (previous CVA). Her hospital course was further complicated by cough, wheeze, sputum, and dyspnea. PCCM was consulted. She was treated for HCAP as well as asthmatic bronchitis vs all pseudoasthma and acute upper airway cough w/ PND. She was started on IV solumedrol --> switched to prednisone 30mg  on 01/25/15 and IV zosyn--> switched to Augmentin 01/25/15 500-125mg  BID on 01/25/15. She was also started on Flonase, albuterol and Brovana as well as protonix 40mg  BID. She was diuresed with IV lasix. Switched back to Torsemide 60mg  qd on 01/23/15. Potassium supplementation was discontinued due to some hyperkalemia. Creat was 1.45 on discharge with plans for outpatient BMET in 1 week with TOC appointment.    Hospital  Course  HCAP/ACUTE ASTHMATIC BRONCHITIS/Upper airway cough w/ PND: Day number 6 of antibiotics. She has decreased sats with ambulation. Home O2 has been arranged. Seen by Dr Melvyn Novas this am. Anticipate discharge to home today with close Pulmonary follow-up. Will continue on Augmentin for 1 more day (she is on day 6/7) and prednisone with 1 week supply. She will need to see pulmonary next week sometime for further instructions. F/u CXR as outpt. Also placed on Flonase, PRN albuterol and Brovana as well as protonix 40mg  BID.   ACUTE ON CHRONIC DIASTOLIC [id number] with IV lasix. Discharge weight 161lbs. Home on 60 of Torsemide po qd.Potassium supplementation was discontinued due to some hyperkalemia while admitted. We will continue her previous home dosing at discharge (73 MeQ with a Torsemide.) However, she will need close follow up of her creatinine and K. Needs TOC appt next week w/ a BMET.   PERICARDIAL EFFUSION: Status post window. 01/16/15: Cultures negative, pathology/cytology nondiagnostic. No residual effusion on echo.   CKD: Creat stable yesterday at 1.45. Follow up as an outpatient. BMET in 1 week   ATRIAL FIB/HISTORY OF PE:On warfarin- will send home on same dosing with follow up in the coumadin clinic on tuesday.INR 2.81 on 01/26/15. Continue on Verapamil CR 180mg . Now on lower dig dose (11mcg--> 62.5 mcg)       02/07/2015 1st [place] [facility] office visit/ [name]   Chief Complaint  Patient presents with  . Hospitalization Follow-up    occ sob pt denies any wheezing, cp/tightness. Pain in right leg x 2 weeks after putting weight on that leg. concern  of pacemaker dropping from 60 to 37 yesterday.  stopped all meds 02/01/15 no nebs at all breathing baseline = MMRC1 = can walk nl pace, flat grade, can't hurry or go uphills or steps s sob   Requesting dilaudid for leg pain after hit R shin  on steps  No obvious day to day or daytime variability or assoc chronic cough or cp  or chest tightness, subjective wheeze or overt sinus or hb symptoms. No unusual exp hx or h/o childhood pna/ asthma or knowledge of premature birth.  Sleeping ok without nocturnal  or early am exacerbation  of respiratory  c/o's or need for noct saba. Also denies any obvious fluctuation of symptoms with weather or environmental changes or other aggravating or alleviating factors except as outlined above   Current Medications, Allergies, Complete Past Medical History, Past Surgical History, Family History, and Social History were reviewed in [employer] record.  ROS  The following are not active complaints unless bolded sore throat, dysphagia, dental problems, itching, sneezing,  nasal congestion or excess/ purulent secretions, ear ache,   fever, chills, sweats, unintended wt loss, classically pleuritic or exertional cp, hemoptysis,  orthopnea pnd or leg swelling, presyncope, palpitations, abdominal pain, anorexia, nausea, vomiting, diarrhea  or change in bowel or bladder habits, change in stools or urine, dysuria,hematuria,  rash, arthralgias, visual complaints, headache, numbness, weakness or ataxia or problems with walking or coordination,  change in mood/affect or memory.          Review of Systems     Objective:   Physical Exam  Hoarse amb chronically ill wf nad RA    Wt Readings from Last 3 Encounters:  02/07/15 157 lb 12.8 oz (71.578 kg)  02/01/15 162 lb 3.2 oz (73.573 kg)  01/31/15 160 lb 3.2 oz (72.666 kg)    Vital signs reviewed   HEENT: nl dentition, turbinates, and oropharynx. Nl external ear canals without cough reflex   NECK :  without JVD/Nodes/TM/ nl carotid upstrokes bilaterally   LUNGS: no acc muscle use,  Nl contour chest which is clear to A and P bilaterally without cough on insp or exp maneuvers   CV:  RRR  no s3 or murmur or increase in P2, no edema   ABD:  soft and nontender with nl inspiratory excursion in the supine position. No  bruits or organomegaly, bowel sounds nl  MS:  Nl gait/ ext warm without deformities, calf tenderness, cyanosis or clubbing No obvious joint restrictions   SKIN: warm and dry without lesions  - both legs wrapped in ACE   NEURO:  alert, approp, nl sensorium with  no motor deficits     CXR PA and Lateral:   02/07/2015 :    I personally reviewed images and agree with radiology impression as follows:    1. Improved aeration of the lung bases. Residual right middle lobe atelectasis. 2. Cardiomegaly and mild pulmonary vascular congestion.     Assessment:

## 2015-02-07 NOTE — Patient Instructions (Addendum)
Please remember to go to the   x-ray department downstairs for your tests - we will call you with the results when they are available  Ok to stop all nebs/ inhalers permanently  Follow up is as needed for respiratory problems

## 2015-02-08 ENCOUNTER — Ambulatory Visit (INDEPENDENT_AMBULATORY_CARE_PROVIDER_SITE_OTHER): Payer: Medicare Other | Admitting: Physician Assistant

## 2015-02-08 ENCOUNTER — Other Ambulatory Visit: Payer: Self-pay | Admitting: *Deleted

## 2015-02-08 ENCOUNTER — Encounter: Payer: Self-pay | Admitting: Physician Assistant

## 2015-02-08 VITALS — BP 118/68 | HR 88 | Ht 63.5 in | Wt 158.2 lb

## 2015-02-08 DIAGNOSIS — I313 Pericardial effusion (noninflammatory): Secondary | ICD-10-CM

## 2015-02-08 DIAGNOSIS — I319 Disease of pericardium, unspecified: Secondary | ICD-10-CM | POA: Diagnosis not present

## 2015-02-08 DIAGNOSIS — Z79899 Other long term (current) drug therapy: Secondary | ICD-10-CM | POA: Diagnosis not present

## 2015-02-08 DIAGNOSIS — I5033 Acute on chronic diastolic (congestive) heart failure: Secondary | ICD-10-CM

## 2015-02-08 DIAGNOSIS — I6310 Cerebral infarction due to embolism of unspecified precerebral artery: Secondary | ICD-10-CM

## 2015-02-08 DIAGNOSIS — I3139 Other pericardial effusion (noninflammatory): Secondary | ICD-10-CM

## 2015-02-08 MED ORDER — METOLAZONE 2.5 MG PO TABS: ORAL_TABLET | ORAL | Status: DC

## 2015-02-08 NOTE — Patient Outreach (Addendum)
[facility]) Care Management  02/08/2015  [name] [birth year] [id number]   Transition of care (week 2)  RN attempted to reach pt today however unsuccessful. Will continue outreach calls accordingly for [facility] services.  Raina Mina, RN Care Management Coordinator [place] Office [phone]

## 2015-02-08 NOTE — Progress Notes (Signed)
Quick Note:  ATC, NA and no option to leave msg, WCB ______ 

## 2015-02-08 NOTE — Progress Notes (Signed)
Cardiology Office Note   Date:  02/08/2015   ID:  [name], DOB [birth year], MRN [id number]  PCP:  [name], MD  Cardiologist:  Dr Juanetta Snow, PA-C   History of Present Illness: [name] is a 82 y.o. female with a history of pericardial effusion and window 01/16/2015, discharged 01/26/2015  [name] presents for post hospital follow-up.  Since discharge from the hospital, she has done pretty well. Her primary care physician started wrapping her legs and she is continuing this. She elevates them twice a day and this helps the edema a great deal. She does not feel she is back to her dry weight, but is still trying to establish what that is.   Her shortness of breath has greatly improved. She is doing a bit more around the house. She is monitoring salt and fluid. She does not feel she is getting extra sodium or drinking extra liquids, but her weight has only gone down by about 1 pound since discharge from the hospital almost 2 weeks ago. However, her dyspnea on exertion, orthopnea and PND have improved.   She is being a little more active around the house and enjoying that. She struggles a bit taking the potassium tablets but is compliant with her medications. Her digoxin level was upper limits of normal at 1.2, and her dose was decreased such that she takes it 5 days a week instead of 7.   Past Medical History  Diagnosis Date  . Pericardial effusion     a. s/p pericardial window 01/16/15  . Atrial fibrillation (HCC)     Chronic  . Chronic anticoagulation     on Coumadin  . History of GI bleed     once treated with Fe infusion  . HTN (hypertension)   . Pacemaker     a. Medtronic, placed for tachybrady.   . Obesity   . LVH (left ventricular hypertrophy)   . Sleep apnea     mild-no cpap  . Positive TB test   . Anemia   . Stroke [facility])     a. 2013: right frontal  . Arthritis   . Tachycardia-bradycardia syndrome [facility])     a. s/p  Medtronic Sensia  model number [id number], serial number [id number] H 12/2013  . History of shingles   . Chronic diastolic (congestive) heart failure ([place])   . Chronic atrial fibrillation (HCC)     a. on coumadin, digoxin and calan  . GI bleed     a. 2/2 AVMs    Past Surgical History  Procedure Laterality Date  . Breast lumpectomy Bilateral     negative for cancer  . Esophagogastroduodenoscopy  05/19/2011    Procedure: ESOPHAGOGASTRODUODENOSCOPY (EGD);  Surgeon: Lear Ng, MD;  [facility] END[facility]COPY;  Service: Endoscopy;  Laterality: N/A;  doctor aware of inr   will try to be here no later than 230  . Cholecystectomy  2005  . Esophagogastroduodenoscopy  06/22/2011    Procedure: ESOPHAGOGASTRODUODENOSCOPY (EGD);  Surgeon: Arta Silence, MD;  Location: [facility] ENDOSCOPY;  Service: Endoscopy;  Laterality: N/A;  Check PT/INR in am  . Givens capsule study  06/23/2011    Procedure: GIVENS CAPSULE STUDY;  Surgeon: Arta Silence, MD;  Location: [facility] ENDOSCOPY;  Service: Endoscopy;  Laterality: N/A;  . Colonoscopy  08/11/2011    Procedure: COLONOSCOPY;  Surgeon: Jeryl Columbia, MD;  Location: [facility] ENDOSCOPY;  Service: Endoscopy;  Laterality: N/A;  . Hot hemostasis  08/11/2011  Procedure: HOT HEMOSTASIS (ARGON PLASMA COAGULATION/BICAP);  Surgeon: Jeryl Columbia, MD;  Location: [facility] ENDOSCOPY;  Service: Endoscopy;  Laterality: N/A;  . Mass excision Left 09/14/2013    Procedure: EXCISION MASS LEFT WRIST;  Surgeon: Leanora Cover, MD;  Location: [place];  Service: Orthopedics;  Laterality: Left;  . Tonsillectomy and adenoidectomy  1950  . Insert / replace / remove pacemaker  2001    Last generator in 2006; interrogated Dec 2012  . Cataract extraction w/ intraocular lens  implant, bilateral Bilateral   . Lipoma excision Left 07/2013    wrist  . Pacemaker generator change N/A 01/02/2014    Procedure: PACEMAKER GENERATOR CHANGE;  Surgeon: Sanda Klein, MD;  Location: [place] CATH LAB;  Service:  Cardiovascular;  Laterality: N/A;  . Lead revision N/A 01/02/2014    Procedure: LEAD REVISION;  Surgeon: Sanda Klein, MD;  Location: [place] CATH LAB;  Service: Cardiovascular;  Laterality: N/A;  . Subxyphoid pericardial window N/A 01/16/2015    Procedure: SUBXYPHOID PERICARDIAL WINDOW;  Surgeon: Grace Isaac, MD;  Location: [place];  Service: Thoracic;  Laterality: N/A;  . Tee without cardioversion N/A 01/16/2015    Procedure: TRANSESOPHAGEAL ECHOCARDIOGRAM (TEE);  Surgeon: Grace Isaac, MD;  Location: [facility] OR;  Service: Thoracic;  Laterality: N/A;    Current Outpatient Prescriptions  Medication Sig Dispense Refill  . acetaminophen-codeine (TYLENOL #3) 300-30 MG tablet TAKE 1 OR 2 TABLETS BY MOUTH EVERY 6 HOURS 100 tablet 0  . Digoxin 62.5 MCG TABS Take 0.0625 mg by mouth daily. 30 tablet 6  . fluticasone (FLONASE) 50 MCG/ACT nasal spray Place 2 sprays into both nostrils 2 (two) times daily as needed for allergies or rhinitis.    . potassium chloride (K-DUR,KLOR-CON) 10 MEQ tablet Take 20 mEq by mouth daily. Take with Torsemide    . senna (SENOKOT) 8.6 MG TABS tablet Take 1 tablet by mouth at bedtime as needed for mild constipation.     . torsemide (DEMADEX) 20 MG tablet Take 3 tablets (60 mg total) by mouth daily. 180 tablet 6  . verapamil (CALAN-SR) 180 MG CR tablet TAKE 1 TABLET(S) BY MOUTH EVERY NIGHT AT BEDTIME 30 tablet 6  . warfarin (COUMADIN) 5 MG tablet Take 1 tablet by mouth daily or as directed by coumadin clinic 90 tablet 1   No current facility-administered medications for this visit.    Allergies:   Iodinated diagnostic agents; Anti-inflammatory enzyme; Arthrotec; Biaxin; Plavix; and Spironolactone    Social History:  The patient  reports that she has never smoked. She has never used smokeless tobacco. She reports that she drinks alcohol. She reports that she does not use illicit drugs.   Family History:  The patient's family history includes Pneumonia in her father;  Stroke in her father and mother.    ROS:  Please see the history of present illness. All other systems are reviewed and negative.    PHYSICAL EXAM: VS:  BP 118/68 mmHg  Pulse 88  Ht 5' 3.5" (1.613 m)  Wt 158 lb 3.2 oz (71.759 kg)  BMI 27.58 kg/m2 , BMI Body mass index is 27.58 kg/(m^2). GEN: Well nourished, well developed, female in no acute distress HEENT: normal for age  Neck: JVD 8 cm with mildly positive hepatojugular reflux, no carotid bruit (murmur radiates to carotids), no masses Cardiac: Slightly irregular; 2-3/6 murmur, no rubs, or gallops Respiratory:  Decreased breath sounds bases bilaterally, normal work of breathing GI: soft, nontender, nondistended, + BS MS: no deformity or  atrophy; 2+ lower extremity edema; distal pulses are 2+ in upper extremities, more difficult to palpate and lower extremities to 2 edema Skin: warm and dry, no rash Neuro:  Strength and sensation are intact Psych: euthymic mood, full affect   EKG:  EKG is not ordered today.  ECHO: 01/24/2015 - Left ventricle: The cavity size was normal. There was mild concentric hypertrophy. Systolic function was normal. The estimated ejection fraction was in the range of 60% to 65%. Wall motion was normal; there were no regional wall motion abnormalities. - Aortic valve: Valve mobility was restricted. Transvalvular velocity was minimally increased. There was mild stenosis. There was mild regurgitation. Valve area (VTI): 1.44 cm^2. Valve area (Vmax): 1.45 cm^2. Valve area (Vmean): 1.41 cm^2. - Mitral valve: Calcified annulus. Mildly thickened leaflets . - Left atrium: The atrium was severely dilated. - Right atrium: The appendage was moderately dilated. - Pulmonary arteries: Systolic pressure was moderately increased. PA peak pressure: 45 mm Hg (S). - Inferior vena cava: The vessel was dilated. The respirophasic diameter changes were blunted (< 50%), consistent with elevated central  venous pressure. - Pericardium, extracardiac: A trivial pericardial effusion was identified posterior to the heart. Features were not consistent with tamponade physiology. Impressions: - Compared to the prior study from 01/16/2015 (post pericardiocentesis TEE), there is a minimal pericardial effusion inferiorly to the LV and a mild effusion in the proximity ro the right atrium with no signs of hemodynamic compromise.  Recent Labs: 01/08/2015: Magnesium 2.0 01/15/2015: TSH 1.989 01/18/2015: ALT 21 01/22/2015: B Natriuretic Peptide 356.3* 01/25/2015: Hemoglobin 11.5*; Platelets 267 02/01/2015: BUN 35*; Creat 1.09*; Potassium 4.9; Sodium 140    Lipid Panel    Component Value Date/Time   CHOL 160 08/03/2013 1155   TRIG 114 08/03/2013 1155   HDL 37* 08/03/2013 1155   CHOLHDL 4.3 08/03/2013 1155   VLDL 23 08/03/2013 1155   LDLCALC 100* 08/03/2013 1155     Wt Readings from Last 3 Encounters:  02/08/15 158 lb 3.2 oz (71.759 kg)  02/07/15 157 lb 12.8 oz (71.578 kg)  02/01/15 162 lb 3.2 oz (73.573 kg)     Other studies Reviewed: Additional studies/ records that were reviewed today include: Office notes and hospital records.  ASSESSMENT AND PLAN:  1.  Acute on chronic diastolic CHF: She still has some extra volume on board based on physical exam and symptoms. She had a BMET a week ago that showed a potassium of 4.9 and creatinine slightly below her baseline. We will add Zaroxolyn twice a week and she is to monitor her symptoms very carefully. Check a BMET in 2 weeks to make sure she is tolerating this well. Call sooner if she has any additional symptoms.  2. Pericardial effusion: There was no significant pericardial effusion by follow-up echo after her pericardial window. She has a follow-up appointment with Dr. Servando Snare scheduled. We will get an echocardiogram and follow-up to continue to monitor the fusion and she will follow-up with Dr. Sallyanne Kuster.  3. CK D: Her last BMET  showed her labs at baseline. She is taking potassium supplementation. She is to continue the potassium supplementation with Zaroxolyn. We'll check BMET in 2 weeks, sooner if needed.  Current medicines are reviewed at length with the patient today.  The patient does not have concerns regarding medicines.  The following changes have been made:  Add Zaroxolyn 2 times a week  Labs/ tests ordered today include:   Orders Placed This Encounter  Procedures  . Basic metabolic panel  .  ECHOCARDIOGRAM COMPLETE     Disposition:   FU with Dr. Sallyanne Kuster  Signed, Lenoard Aden  02/08/2015 1:27 PM    [facility] [address], [place], [place]  [address] Phone: [phone]; Fax: [phone]

## 2015-02-08 NOTE — Patient Instructions (Addendum)
Your physician has recommended you make the following change in your medication:   1.) Start new prescription for zaroxylyn. ( metolazone)  This has been sent to your pharmacy.  Your physician has requested that you have an echocardiogram. Echocardiography is a painless test that uses sound waves to create images of your heart. It provides your doctor with information about the size and shape of your heart and how well your heart's chambers and valves are working. This procedure takes approximately one hour. There are no restrictions for this procedure.  Your physician recommends that you return for lab work in: [place]. YOU WILL NOT NEED TO FAST.  Your physician recommends that you schedule a follow-up appointment With Dr Sallyanne Kuster first available.

## 2015-02-10 NOTE — Assessment & Plan Note (Addendum)
Resolved / no evidence of vcd or any residual upper airway issues off gerd rx >  No  f/u needed

## 2015-02-10 NOTE — Assessment & Plan Note (Signed)
Off all rx/ no need for 02 or resp rx/ minimal residual changes > no need for any f/u at this point  I had an extended discussion with the patient reviewing all relevant studies completed to date and  lasting 15 to 20 minutes of a 25 minute visit    Each maintenance medication was reviewed in detail including most importantly the difference between maintenance and prns and under what circumstances the prns are to be triggered using an action plan format that is not reflected in the computer generated alphabetically organized AVS.    Please see instructions for details which were reviewed in writing and the patient given a copy highlighting the part that I personally wrote and discussed at today's ov.

## 2015-02-11 LAB — POCT INR: INR: 1.8

## 2015-02-11 NOTE — Progress Notes (Signed)
Quick Note:  ATC, NA and no option to leave msg, WCB ______ 

## 2015-02-12 LAB — FUNGUS CULTURE W SMEAR: Fungal Smear: NONE SEEN

## 2015-02-12 NOTE — Progress Notes (Signed)
Quick Note:  ATC, NA and no option to leave msg ______ 

## 2015-02-13 ENCOUNTER — Other Ambulatory Visit: Payer: Self-pay | Admitting: Cardiothoracic Surgery

## 2015-02-13 ENCOUNTER — Ambulatory Visit (INDEPENDENT_AMBULATORY_CARE_PROVIDER_SITE_OTHER): Payer: Medicare Other | Admitting: Pharmacist Clinician (PhC)/ Clinical Pharmacy Specialist

## 2015-02-13 DIAGNOSIS — I3139 Other pericardial effusion (noninflammatory): Secondary | ICD-10-CM

## 2015-02-13 DIAGNOSIS — Z7901 Long term (current) use of anticoagulants: Secondary | ICD-10-CM

## 2015-02-13 DIAGNOSIS — I6310 Cerebral infarction due to embolism of unspecified precerebral artery: Secondary | ICD-10-CM

## 2015-02-13 DIAGNOSIS — I313 Pericardial effusion (noninflammatory): Secondary | ICD-10-CM

## 2015-02-13 DIAGNOSIS — I480 Paroxysmal atrial fibrillation: Secondary | ICD-10-CM

## 2015-02-14 ENCOUNTER — Encounter: Payer: Self-pay | Admitting: Cardiothoracic Surgery

## 2015-02-14 ENCOUNTER — Ambulatory Visit
Admission: RE | Admit: 2015-02-14 | Discharge: 2015-02-14 | Disposition: A | Discharge status: Home / Self Care | Payer: Medicare Other | Source: Ambulatory Visit | Attending: Cardiothoracic Surgery | Admitting: Cardiothoracic Surgery

## 2015-02-14 ENCOUNTER — Ambulatory Visit (INDEPENDENT_AMBULATORY_CARE_PROVIDER_SITE_OTHER): Payer: Self-pay | Admitting: Cardiothoracic Surgery

## 2015-02-14 VITALS — BP 117/61 | HR 80 | Resp 20 | Ht 63.5 in | Wt 158.0 lb

## 2015-02-14 DIAGNOSIS — I313 Pericardial effusion (noninflammatory): Secondary | ICD-10-CM

## 2015-02-14 DIAGNOSIS — I319 Disease of pericardium, unspecified: Secondary | ICD-10-CM

## 2015-02-14 DIAGNOSIS — I3139 Other pericardial effusion (noninflammatory): Secondary | ICD-10-CM

## 2015-02-14 NOTE — Progress Notes (Signed)
[address]       [place] [address]             [phone]      Jetta P Martinezgarcia [place] Medical Record [id number] Date of Birth: Dec 29, 1923  Referring: Sanda Klein, MD Primary Care: [name], MD  Chief Complaint:   POST OP FOLLOW UP 01/16/2015 DATE OF DISCHARGE:   PREOPERATIVE DIAGNOSIS: Large pericardial effusion. POSTOPERATIVE DIAGNOSIS: Large pericardial effusion. PROCEDURE: Subxiphoid pericardial window.  SURGEON: Lanelle Bal, MD History of Present Illness:     Patient is status post subxiphoid pericardial window, her postop course was prolonged. She is now at home and mobile and increasing her physical activity     Past Medical History  Diagnosis Date  . Pericardial effusion     a. s/p pericardial window 01/16/15  . Atrial fibrillation (HCC)     Chronic  . Chronic anticoagulation     on Coumadin  . History of GI bleed     once treated with Fe infusion  . HTN (hypertension)   . Pacemaker     a. Medtronic, placed for tachybrady.   . Obesity   . LVH (left ventricular hypertrophy)   . Sleep apnea     mild-no cpap  . Positive TB test   . Anemia   . Stroke [facility])     a. 2013: right frontal  . Arthritis   . Tachycardia-bradycardia syndrome [facility])     a. s/p Medtronic Sensia  model number [id number], serial number [id number] H 12/2013  . History of shingles   . Chronic diastolic (congestive) heart failure ([place])   . Chronic atrial fibrillation (HCC)     a. on coumadin, digoxin and calan  . GI bleed     a. 2/2 AVMs     History  Smoking status  . Never Smoker   Smokeless tobacco  . Never Used    History  Alcohol Use  . 0.0 oz/week  . 0 Standard drinks or equivalent per week    Comment: 01/02/2014 "I'll have a drink a few times/year"     Allergies  Allergen Reactions  . Iodinated Diagnostic Agents Hives    HIVES 15MIN S/P IV CONTRAST INJECTION,WILL NEED 13 HR PREP FOR FUTURE  INJECTIONS, ok s/p 50mg  po benadryl//a.calhoun  . Anti-Inflammatory Enzyme [Nutritional Supplements]     Retains fluids and headaches  . Arthrotec [Diclofenac-Misoprostol] Other (See Comments)    unknown  . Biaxin [Clarithromycin] Other (See Comments)    unknown  . Plavix [Clopidogrel Bisulfate] Other (See Comments)    unknown  . Spironolactone     Hair loss    Current Outpatient Prescriptions  Medication Sig Dispense Refill  . acetaminophen-codeine (TYLENOL #3) 300-30 MG tablet TAKE 1 OR 2 TABLETS BY MOUTH EVERY 6 HOURS 100 tablet 0  . Digoxin 62.5 MCG TABS Take 0.0625 mg by mouth daily. 30 tablet 6  . fluticasone (FLONASE) 50 MCG/ACT nasal spray Place 2 sprays into both nostrils 2 (two) times daily as needed for allergies or rhinitis.    Marland Kitchen metolazone (ZAROXOLYN) 2.5 MG tablet TAKE 1 TABLET 30 MINUTES PRIOR TO TORSEMIDE DOSE ON Wednesday AND Saturday. 30 tablet 3  . potassium chloride (K-DUR,KLOR-CON) 10 MEQ tablet Take 20 mEq by mouth daily. Take with Torsemide    . senna (SENOKOT) 8.6 MG TABS tablet Take 1 tablet by mouth at bedtime as needed for mild constipation.     . torsemide (DEMADEX) 20 MG tablet  Take 3 tablets (60 mg total) by mouth daily. 180 tablet 6  . verapamil (CALAN-SR) 180 MG CR tablet TAKE 1 TABLET(S) BY MOUTH EVERY NIGHT AT BEDTIME 30 tablet 6  . warfarin (COUMADIN) 5 MG tablet Take 1 tablet by mouth daily or as directed by coumadin clinic 90 tablet 1   No current facility-administered medications for this visit.       Physical Exam: BP 117/61 mmHg  Pulse 80  Resp 20  Ht 5' 3.5" (1.613 m)  Wt 158 lb (71.668 kg)  BMI 27.55 kg/m2  SpO2 91%  General appearance: alert and cooperative Neurologic: intact Heart: regular rate and rhythm, S1, S2 normal, no murmur, click, rub or gallop Lungs: clear to auscultation bilaterally Abdomen: soft, non-tender; bowel sounds normal; no masses,  no organomegaly Extremities: Significant bilateral edema but much improved  from her preop state Wound: Subxiphoid pericardial wound is well healed   Diagnostic Studies & Laboratory data:     Recent Radiology Findings:   Dg Chest 2 View  02/14/2015  CLINICAL DATA:  Pericardial effusion EXAM: CHEST  2 VIEW COMPARISON:  02/07/2015 FINDINGS: Cardiac enlargement unchanged from the prior study. Transvenous pacemaker with 3 leads unchanged in position. Mild vascular congestion unchanged. Negative for pulmonary edema. No significant pleural effusion. Negative for pneumonia or mass IMPRESSION: Cardiac enlargement with vascular congestion unchanged from the prior study. Negative for edema. Electronically Signed   By: Franchot Gallo M.D.   On: 02/14/2015 15:55      Recent Lab Findings: Lab Results  Component Value Date   WBC 7.2 01/25/2015   HGB 11.5* 01/25/2015   HCT 36.9 01/25/2015   PLT 267 01/25/2015   GLUCOSE 154* 02/01/2015   CHOL 160 08/03/2013   TRIG 114 08/03/2013   HDL 37* 08/03/2013   LDLCALC 100* 08/03/2013   ALT 21 01/18/2015   AST 27 01/18/2015   NA 140 02/01/2015   K 4.9 02/01/2015   CL 93* 02/01/2015   CREATININE 1.09* 02/01/2015   BUN 35* 02/01/2015   CO2 36* 02/01/2015   TSH 1.989 01/15/2015   INR 1.8 02/11/2015   HGBA1C 6.2* 01/15/2015   Study Conclusions  - Left ventricle: The cavity size was normal. There was mild concentric hypertrophy. Systolic function was normal. The estimated ejection fraction was in the range of 60% to 65%. Wall motion was normal; there were no regional wall motion abnormalities. - Aortic valve: Valve mobility was restricted. Transvalvular velocity was minimally increased. There was mild stenosis. There was mild regurgitation. Valve area (VTI): 1.44 cm^2. Valve area (Vmax): 1.45 cm^2. Valve area (Vmean): 1.41 cm^2. - Mitral valve: Calcified annulus. Mildly thickened leaflets . - Left atrium: The atrium was severely dilated. - Right atrium: The appendage was moderately dilated. - Pulmonary  arteries: Systolic pressure was moderately increased. PA peak pressure: 45 mm Hg (S). - Inferior vena cava: The vessel was dilated. The respirophasic diameter changes were blunted (< 50%), consistent with elevated central venous pressure. - Pericardium, extracardiac: A trivial pericardial effusion was identified posterior to the heart. Features were not consistent with tamponade physiology.  Impressions:  - Compared to the prior study from 01/16/2015 (post pericardiocentesis TEE), there is a minimal pericardial effusion inferiorly to the LV and a mild effusion in the proximity ro the right atrium with no signs of hemodynamic compromise.    Assessment / Plan:      Pericardial effusion resolved with subxiphoid pericardial window: There was no significant pericardial effusion by follow-up echo after her  pericardial window. Overall patient's functional status is improved especially with marked decrease in her orthopnea She is making good progress postoperatively I not made a return appointment for her to see me in the surgical office but would be glad to add cardiology requested anytime  Grace Isaac MD      [address] [place] [address] Office [phone]   Beeper [phone]  02/14/2015 4:50 PM

## 2015-02-15 ENCOUNTER — Other Ambulatory Visit: Payer: Self-pay | Admitting: *Deleted

## 2015-02-15 NOTE — Patient Outreach (Signed)
[facility]) Care Management  02/15/2015  [name] 1924-01-26 [id number]  Transition of care (week 3)  Rn attempted outreach call today however only able to leave HIPAA approved message requesting a call back. Will inquire further on pt's management of care at this time.  Raina Mina, RN Care Management Coordinator [place] Office [phone]

## 2015-02-21 ENCOUNTER — Ambulatory Visit (HOSPITAL_COMMUNITY): Payer: Medicare Other | Attending: Cardiovascular Disease

## 2015-02-21 ENCOUNTER — Other Ambulatory Visit: Payer: Self-pay | Admitting: *Deleted

## 2015-02-21 ENCOUNTER — Ambulatory Visit (INDEPENDENT_AMBULATORY_CARE_PROVIDER_SITE_OTHER): Payer: Medicare Other | Admitting: Emergency Medicine

## 2015-02-21 ENCOUNTER — Other Ambulatory Visit: Payer: Self-pay | Admitting: Physician Assistant

## 2015-02-21 ENCOUNTER — Other Ambulatory Visit: Payer: Self-pay

## 2015-02-21 VITALS — BP 118/60 | HR 77 | Temp 98.0°F | Resp 16 | Ht 65.0 in | Wt 138.0 lb

## 2015-02-21 DIAGNOSIS — I340 Nonrheumatic mitral (valve) insufficiency: Secondary | ICD-10-CM | POA: Diagnosis not present

## 2015-02-21 DIAGNOSIS — I351 Nonrheumatic aortic (valve) insufficiency: Secondary | ICD-10-CM | POA: Diagnosis not present

## 2015-02-21 DIAGNOSIS — I10 Essential (primary) hypertension: Secondary | ICD-10-CM | POA: Diagnosis not present

## 2015-02-21 DIAGNOSIS — I6310 Cerebral infarction due to embolism of unspecified precerebral artery: Secondary | ICD-10-CM | POA: Diagnosis not present

## 2015-02-21 DIAGNOSIS — R1030 Lower abdominal pain, unspecified: Secondary | ICD-10-CM

## 2015-02-21 DIAGNOSIS — I319 Disease of pericardium, unspecified: Secondary | ICD-10-CM | POA: Insufficient documentation

## 2015-02-21 DIAGNOSIS — I517 Cardiomegaly: Secondary | ICD-10-CM | POA: Diagnosis not present

## 2015-02-21 DIAGNOSIS — I313 Pericardial effusion (noninflammatory): Secondary | ICD-10-CM

## 2015-02-21 DIAGNOSIS — I3139 Other pericardial effusion (noninflammatory): Secondary | ICD-10-CM

## 2015-02-21 LAB — POC MICROSCOPIC URINALYSIS (UMFC): MUCUS RE: ABSENT

## 2015-02-21 LAB — POCT URINALYSIS DIP (MANUAL ENTRY)
BILIRUBIN UA: NEGATIVE
BILIRUBIN UA: NEGATIVE
GLUCOSE UA: NEGATIVE
Leukocytes, UA: NEGATIVE
Nitrite, UA: NEGATIVE
Protein Ur, POC: NEGATIVE
Spec Grav, UA: 1.015
UROBILINOGEN UA: 0.2
pH, UA: 7.5

## 2015-02-21 NOTE — Patient Outreach (Signed)
[facility]) Care Management  02/21/2015  SADIAH BAEZ Jul 09, 1923 [id number]   Transition of care (week 4)  RN attempted once again an outreach call for ongoing transition of care however unsuccessful. RN unable to leave a message to request a call back however RN has a scheduled follow up visit on 2/2. Will follow up accordingly and proceed with HF program if pt remains receptive to [facility] services. Note last 2 weeks with no response to calls.   Raina Mina, RN Care Management Coordinator [place] Office [phone]

## 2015-02-21 NOTE — Progress Notes (Signed)
Patient ID: [name], female   DOB: May 31, 1923, 82 y.o.   MRN: [id number]     By signing my name below, I, [name], attest that this documentation has been prepared under the direction and in the presence of [name], MD.  Electronically Signed: Zola [name], Medical Scribe. 02/21/2015. 11:38 AM.   Chief Complaint:  Chief Complaint  Patient presents with  . Abdominal Pain    lower, x 2 days   . Dizziness    x 2 days     HPI: [name] is a 82 y.o. female who reports to [facility] today for a follow-up. Patient was hospitalized about a month ago for large pericardial effusion. She had a pericardial window placed with urinary catheter at that time. She is now complaining of lower abdominal pain and urinary symptoms over the past few days. She thinks she may have a UTI. She notes that she has frequent constipation at baseline and takes Senokot every night.  Past Medical History  Diagnosis Date  . Pericardial effusion     a. s/p pericardial window 01/16/15  . Atrial fibrillation (HCC)     Chronic  . Chronic anticoagulation     on Coumadin  . History of GI bleed     once treated with Fe infusion  . HTN (hypertension)   . Pacemaker     a. Medtronic, placed for tachybrady.   . Obesity   . LVH (left ventricular hypertrophy)   . Sleep apnea     mild-no cpap  . Positive TB test   . Anemia   . Stroke [facility])     a. 2013: right frontal  . Arthritis   . Tachycardia-bradycardia syndrome [facility])     a. s/p Medtronic Sensia  model number [id number], serial number [id number] H 12/2013  . History of shingles   . Chronic diastolic (congestive) heart failure ([place])   . Chronic atrial fibrillation (HCC)     a. on coumadin, digoxin and calan  . GI bleed     a. 2/2 AVMs   Past Surgical History  Procedure Laterality Date  . Breast lumpectomy Bilateral     negative for cancer  . Esophagogastroduodenoscopy  05/19/2011    Procedure: ESOPHAGOGASTRODUODENOSCOPY (EGD);  Surgeon: Lear Ng, MD;  Location: [facility] ENDOSCOPY;  Service: Endoscopy;  Laterality: N/A;  doctor aware of inr   will try to be here no later than 230  . Cholecystectomy  2005  . Esophagogastroduodenoscopy  06/22/2011    Procedure: ESOPHAGOGASTRODUODENOSCOPY (EGD);  Surgeon: Arta Silence, MD;  Location: [facility] ENDOSCOPY;  Service: Endoscopy;  Laterality: N/A;  Check PT/INR in am  . Givens capsule study  06/23/2011    Procedure: GIVENS CAPSULE STUDY;  Surgeon: Arta Silence, MD;  Location[facility] ENDOSCOPY;  Service: Endoscopy;  Laterality: N/A;  . Colonoscopy  08/11/2011    Procedure: COLONOSCOPY;  Surgeon: Jeryl Columbia, MD;  Location: [facility] ENDOSCOPY;  Service: Endoscopy;  Laterality: N/A;  . Hot hemostasis  08/11/2011    Procedure: HOT HEMOSTASIS (ARGON PLASMA COAGULATION/BICAP);  Surgeon: Jeryl Columbia, MD;  Location: [facility] ENDOSCOPY;  Service: Endoscopy;  Laterality: N/A;  . Mass excision Left 09/14/2013    Procedure: EXCISION MASS LEFT WRIST;  Surgeon: Leanora Cover, MD;  Location: [place];  Service: Orthopedics;  Laterality: Left;  . Tonsillectomy and adenoidectomy  1950  . Insert / replace / remove pacemaker  2001    Last generator in 2006; interrogated Dec 2012  . Cataract  extraction w/ intraocular lens  implant, bilateral Bilateral   . Lipoma excision Left 07/2013    wrist  . Pacemaker generator change N/A 01/02/2014    Procedure: PACEMAKER GENERATOR CHANGE;  Surgeon: Sanda Klein, MD;  Location: [place] CATH LAB;  Service: Cardiovascular;  Laterality: N/A;  . Lead revision N/A 01/02/2014    Procedure: LEAD REVISION;  Surgeon: Sanda Klein, MD;  Location: [place] CATH LAB;  Service: Cardiovascular;  Laterality: N/A;  . Subxyphoid pericardial window N/A 01/16/2015    Procedure: SUBXYPHOID PERICARDIAL WINDOW;  Surgeon: Grace Isaac, MD;  Location: [place];  Service: Thoracic;  Laterality: N/A;  . Tee without cardioversion N/A 01/16/2015    Procedure: TRANSESOPHAGEAL ECHOCARDIOGRAM (TEE);  Surgeon:  Grace Isaac, MD;  Location: [place];  Service: Thoracic;  Laterality: N/A;   Social History   Social History  . Marital Status: Widowed    Spouse Name: N/A  . Number of Children: N/A  . Years of Education: N/A   Social History Main Topics  . Smoking status: Never Smoker   . Smokeless tobacco: Never Used  . Alcohol Use: 0.0 oz/week    0 Standard drinks or equivalent per week     Comment: 01/02/2014 "I'll have a drink a few times/year"  . Drug Use: No  . Sexual Activity: No   Other Topics Concern  . None   Social History Narrative   Family History  Problem Relation Age of Onset  . Stroke Mother   . Stroke Father   . Pneumonia Father    Allergies  Allergen Reactions  . Iodinated Diagnostic Agents Hives    HIVES 15MIN S/P IV CONTRAST INJECTION,WILL NEED 13 HR PREP FOR FUTURE INJECTIONS, ok s/p 50mg  po benadryl//a.calhoun  . Anti-Inflammatory Enzyme [Nutritional Supplements]     Retains fluids and headaches  . Arthrotec [Diclofenac-Misoprostol] Other (See Comments)    unknown  . Biaxin [Clarithromycin] Other (See Comments)    unknown  . Plavix [Clopidogrel Bisulfate] Other (See Comments)    unknown  . Spironolactone     Hair loss   Prior to Admission medications   Medication Sig Start Date End Date Taking? Authorizing Provider  Digoxin 62.5 MCG TABS Take 0.0625 mg by mouth daily. 01/26/15  Yes Eileen Stanford, PA-C  metolazone (ZAROXOLYN) 2.5 MG tablet TAKE 1 TABLET 30 MINUTES PRIOR TO TORSEMIDE DOSE ON Wednesday AND Saturday. 02/08/15  Yes Rhonda G Barrett, PA-C  potassium chloride (K-DUR,KLOR-CON) 10 MEQ tablet Take 20 mEq by mouth daily. Take with Torsemide   Yes Historical Provider, MD  senna (SENOKOT) 8.6 MG TABS tablet Take 1 tablet by mouth at bedtime as needed for mild constipation.    Yes Historical Provider, MD  torsemide (DEMADEX) 20 MG tablet Take 3 tablets (60 mg total) by mouth daily. 01/26/15  Yes Eileen Stanford, PA-C  verapamil (CALAN-SR) 180 MG  CR tablet TAKE 1 TABLET(S) BY MOUTH EVERY NIGHT AT BEDTIME 01/29/15  Yes Mihai Croitoru, MD  warfarin (COUMADIN) 5 MG tablet Take 1 tablet by mouth daily or as directed by coumadin clinic 01/30/15  Yes Mihai Croitoru, MD     ROS: The patient denies fevers, chills, night sweats, unintentional weight loss, chest pain, wheezing, nausea, vomiting, dysuria, hematuria, melena.  All other systems have been reviewed and were otherwise negative with the exception of those mentioned in the HPI and as above.    PHYSICAL EXAM: Filed Vitals:   02/21/15 1125  BP: 118/60  Pulse: 77  Temp: 98 F (  36.7 C)  Resp: 16   Body mass index is 22.96 kg/(m^2).   General: Alert, no acute distress HEENT:  Normocephalic, atraumatic, oropharynx patent. Eye: Juliette Mangle [facility] Cardiovascular:  Regular rate and rhythm. Respiratory: Clear to auscultation bilaterally.  No wheezes, rales, or rhonchi.  No cyanosis, no use of accessory musculature Abdominal: Orange-sized palpable mass, left lower abdomen with mild suprapubic tenderness. No abdominal distension. Musculoskeletal: Gait intact. No edema, tenderness Skin: No rashes. Neurologic: Facial musculature symmetric. Psychiatric: Patient acts appropriately throughout our interaction. Lymphatic: No cervical or submandibular lymphadenopathy    LABS: Results for orders placed or performed in visit on 02/21/15  POCT urinalysis dipstick  Result Value Ref Range   Color, UA yellow yellow   Clarity, UA clear clear   Glucose, UA negative negative   Bilirubin, UA negative negative   Ketones, POC UA negative negative   Spec Grav, UA 1.015    Blood, UA trace-intact (A) negative   pH, UA 7.5    Protein Ur, POC negative negative   Urobilinogen, UA 0.2    Nitrite, UA Negative Negative   Leukocytes, UA Negative Negative  POCT Microscopic Urinalysis ([facility])  Result Value Ref Range   WBC,UR,HPF,POC None None WBC/hpf   RBC,UR,HPF,POC Few (A) None RBC/hpf   Bacteria None  None, Too numerous to count   Mucus Absent Absent   Epithelial Cells, UR Per Microscopy None None, Too numerous to count cells/hpf     EKG/XRAY:   Primary read interpreted by Dr. Everlene Farrier at [facility].   ASSESSMENT/PLAN: No stool in the rectum. She will have to try enemas and see if she can break up her impaction. I personally performed the services described in this documentation, which was scribed in my presence. The recorded information has been reviewed and is accurate.    Gross sideeffects, risk and benefits, and alternatives of medications d/w patient. Patient is aware that all medications have potential sideeffects and we are unable to predict every sideeffect or drug-drug interaction that may occur.  [name] MD 02/21/2015 11:38 AM

## 2015-02-21 NOTE — Patient Instructions (Signed)

## 2015-02-22 LAB — URINE CULTURE: Colony Count: 75000

## 2015-02-22 NOTE — Patient Outreach (Signed)
  Received telephone call from patient's daughter, [name] stating that her mother has now gone back home and she does not need [facility] Care Management services any longer. [name] states they wish to dis enroll in the [facility] program. [name] reports her mother is doing a lot better and no further services are needed. Made her aware that writer was not her mother's [facility] but will pass information along.   Will make [place] aware and will notify [place] Management office as well.  Marthenia Rolling, MSN-Ed, RN,BSN [facility] Liaison [phone]

## 2015-02-23 ENCOUNTER — Telehealth: Payer: Self-pay

## 2015-02-23 LAB — BASIC METABOLIC PANEL
BUN: 22 mg/dL (ref 7–25)
CHLORIDE: 92 mmol/L — AB (ref 98–110)
CO2: 38 mmol/L — AB (ref 20–31)
Calcium: 9.7 mg/dL (ref 8.6–10.4)
Creat: 1.03 mg/dL — ABNORMAL HIGH (ref 0.60–0.88)
Glucose, Bld: 95 mg/dL (ref 65–99)
Potassium: 3 mmol/L — ABNORMAL LOW (ref 3.5–5.3)
SODIUM: 140 mmol/L (ref 135–146)

## 2015-02-23 NOTE — Telephone Encounter (Signed)
Pt called today to let Dr. Everlene Farrier know that she still has not had success with a bowel movement in days. She has tried everything Dr. Everlene Farrier suggested, including an Enema. She states that the Enema that was purchased at her pharmacy "does not go up far enough". Pt is desperate for relief and wants to know what Dr. Everlene Farrier suggests.

## 2015-02-24 NOTE — Telephone Encounter (Signed)
Doing better. Use mineral oil enemas.I have called.r

## 2015-02-25 ENCOUNTER — Other Ambulatory Visit: Payer: Self-pay | Admitting: *Deleted

## 2015-02-25 ENCOUNTER — Encounter: Payer: Self-pay | Admitting: *Deleted

## 2015-02-25 NOTE — Patient Outreach (Signed)
[facility]) Care Management  02/25/2015  GORETTI DUX [birth year] [id number]   Reported received from hospital liaison pt has returned home and feels she can manager her care and no longer needs [facility] services. Pt has requested her case be closed from further [facility] services at this time.  Case will be closed as requested and Dr. Everlene Farrier will be informed accordingly.  Raina Mina, RN Care Management Coordinator [place] Office [phone]

## 2015-02-26 LAB — POCT INR: INR: 4.1

## 2015-02-27 ENCOUNTER — Ambulatory Visit (INDEPENDENT_AMBULATORY_CARE_PROVIDER_SITE_OTHER): Payer: Medicare Other | Admitting: Pharmacist Clinician (PhC)/ Clinical Pharmacy Specialist

## 2015-02-27 DIAGNOSIS — I480 Paroxysmal atrial fibrillation: Secondary | ICD-10-CM

## 2015-02-27 DIAGNOSIS — Z7901 Long term (current) use of anticoagulants: Secondary | ICD-10-CM

## 2015-02-27 DIAGNOSIS — I6310 Cerebral infarction due to embolism of unspecified precerebral artery: Secondary | ICD-10-CM

## 2015-02-27 NOTE — Progress Notes (Signed)
Patient called with Echo results.  States she is not swelling (she has ankles now).  Took one dose of Metolazone and lost 7 lbs and doesn't ever want to take it again.  No complaints of SOB.  Will call if she has any problems.  Dr. Loletha Grayer notified of this.

## 2015-02-28 ENCOUNTER — Ambulatory Visit
Admission: RE | Admit: 2015-02-28 | Discharge: 2015-02-28 | Disposition: A | Discharge status: Home / Self Care | Payer: Medicare Other | Source: Ambulatory Visit | Attending: Gastroenterology | Admitting: Gastroenterology

## 2015-02-28 ENCOUNTER — Other Ambulatory Visit: Payer: Self-pay | Admitting: Gastroenterology

## 2015-02-28 ENCOUNTER — Ambulatory Visit: Payer: Self-pay | Admitting: *Deleted

## 2015-02-28 DIAGNOSIS — K5900 Constipation, unspecified: Secondary | ICD-10-CM

## 2015-02-28 LAB — AFB CULTURE WITH SMEAR (NOT AT ARMC): Acid Fast Smear: NONE SEEN

## 2015-03-01 ENCOUNTER — Telehealth: Payer: Self-pay | Admitting: *Deleted

## 2015-03-01 DIAGNOSIS — Z79899 Other long term (current) drug therapy: Secondary | ICD-10-CM

## 2015-03-01 NOTE — Telephone Encounter (Signed)
-----   Message from Lonn Georgia, PA-C sent at 02/28/2015  3:27 PM EST ----- Pt of Dr C. She had seen her PCP, but he did not address the low K+. Can you make sure she takes an extra 40 meq of Kdur today? She needs another BMET next week.

## 2015-03-01 NOTE — Telephone Encounter (Signed)
Lab results called to patient.  Instructed to take an extra 40 meq of K+ today and have her labs rechecked next Friday.  Voiced understanding.

## 2015-03-05 ENCOUNTER — Ambulatory Visit (INDEPENDENT_AMBULATORY_CARE_PROVIDER_SITE_OTHER): Payer: Medicare Other | Admitting: Pharmacist Clinician (PhC)/ Clinical Pharmacy Specialist

## 2015-03-05 DIAGNOSIS — Z7901 Long term (current) use of anticoagulants: Secondary | ICD-10-CM

## 2015-03-05 DIAGNOSIS — I480 Paroxysmal atrial fibrillation: Secondary | ICD-10-CM

## 2015-03-05 DIAGNOSIS — I6310 Cerebral infarction due to embolism of unspecified precerebral artery: Secondary | ICD-10-CM

## 2015-03-05 LAB — POCT INR: INR: 1.6

## 2015-03-07 ENCOUNTER — Encounter: Payer: Self-pay | Admitting: Emergency Medicine

## 2015-03-07 ENCOUNTER — Telehealth: Payer: Self-pay | Admitting: Family Medicine

## 2015-03-07 ENCOUNTER — Ambulatory Visit (INDEPENDENT_AMBULATORY_CARE_PROVIDER_SITE_OTHER): Payer: Medicare Other | Admitting: Emergency Medicine

## 2015-03-07 VITALS — BP 111/68 | HR 106 | Temp 97.9°F | Resp 16 | Ht 65.0 in | Wt 145.9 lb

## 2015-03-07 DIAGNOSIS — I6310 Cerebral infarction due to embolism of unspecified precerebral artery: Secondary | ICD-10-CM | POA: Diagnosis not present

## 2015-03-07 DIAGNOSIS — R1030 Lower abdominal pain, unspecified: Secondary | ICD-10-CM | POA: Diagnosis not present

## 2015-03-07 DIAGNOSIS — R1904 Left lower quadrant abdominal swelling, mass and lump: Secondary | ICD-10-CM

## 2015-03-07 NOTE — Progress Notes (Signed)
Patient ID: [name], female   DOB: Apr 21, 1923, 82 y.o.   MRN: [id number]     By signing my name below, I, Zola Button, attest that this documentation has been prepared under the direction and in the presence of [name], MD.  Electronically Signed: Zola Button, Medical Scribe. 03/07/2015. 10:20 AM.   Chief Complaint:  Chief Complaint  Patient presents with  . Follow-up    hospital visit  . Constipation    HPI: [name] is a 82 y.o. female who reports to [facility] today for a follow-up.  Patient is still having problems with constipation. She also reports having left-sided lower abdominal tenderness, which she believes is due to stool in her colon. There is a firm area to the left lower abdomen. She states she is not taking enemas anymore. She contacted Dr. Watt Climes who told her to take Miralax. She took 6 Miralax 5 days ago but has not been taking it every day. She has had bowel movements with small, ribbon-like stool since then, but she still has constipation. Her next appointment with Dr. Watt Climes is next week, in 6 days. She last saw Dr. Watt Climes this past September. She had an abdominal XR 1 week ago which showed: persistent dilated loops of small bowel are noted; stool is noted throughout the colon; findings consist with persistent partial small bowel obstruction; no free air. Her PSHx includes cholecystectomy.  Patient would also like to discuss her pain medications. She states the codeine keeps her up at night unless she takes it early enough. As a result, she has to take it sometimes even when she does not need it. She believes her medications are contributing to her constipation. She would like to switch to a different medication that would not keep her up at night so she would not have to take as much medication.  Past Medical History  Diagnosis Date  . Pericardial effusion     a. s/p pericardial window 01/16/15  . Atrial fibrillation (HCC)     Chronic  . Chronic  anticoagulation     on Coumadin  . History of GI bleed     once treated with Fe infusion  . HTN (hypertension)   . Pacemaker     a. Medtronic, placed for tachybrady.   . Obesity   . LVH (left ventricular hypertrophy)   . Sleep apnea     mild-no cpap  . Positive TB test   . Anemia   . Stroke [facility])     a. 2013: right frontal  . Arthritis   . Tachycardia-bradycardia syndrome [facility])     a. s/p Medtronic Sensia  model number [id number], serial number [id number] H 12/2013  . History of shingles   . Chronic diastolic (congestive) heart failure ([place])   . Chronic atrial fibrillation (HCC)     a. on coumadin, digoxin and calan  . GI bleed     a. 2/2 AVMs   Past Surgical History  Procedure Laterality Date  . Breast lumpectomy Bilateral     negative for cancer  . Esophagogastroduodenoscopy  05/19/2011    Procedure: ESOPHAGOGASTRODUODENOSCOPY (EGD);  Surgeon: Lear Ng, MD;  Location: [facility] ENDOSCOPY;  Service: Endoscopy;  Laterality: N/A;  doctor aware of inr   will try to be here no later than 230  . Cholecystectomy  2005  . Esophagogastroduodenoscopy  06/22/2011    Procedure: ESOPHAGOGASTRODUODENOSCOPY (EGD);  Surgeon: Arta Silence, MD;  Location: [facility] ENDOSCOPY;  Service: Endoscopy;  Laterality: N/A;  Check  PT/INR in am  . Givens capsule study  06/23/2011    Procedure: GIVENS CAPSULE STUDY;  Surgeon: Arta Silence, MD;  Location: [facility] ENDOSCOPY;  Service: Endoscopy;  Laterality: N/A;  . Colonoscopy  08/11/2011    Procedure: COLONOSCOPY;  Surgeon: Jeryl Columbia, MD;  Location: [facility] ENDOSCOPY;  Service: Endoscopy;  Laterality: N/A;  . Hot hemostasis  08/11/2011    Procedure: HOT HEMOSTASIS (ARGON PLASMA COAGULATION/BICAP);  Surgeon: Jeryl Columbia, MD;  Location: [facility] ENDOSCOPY;  Service: Endoscopy;  Laterality: N/A;  . Mass excision Left 09/14/2013    Procedure: EXCISION MASS LEFT WRIST;  Surgeon: Leanora Cover, MD;  Location: [place];  Service: Orthopedics;  Laterality: Left;  .  Tonsillectomy and adenoidectomy  1950  . Insert / replace / remove pacemaker  2001    Last generator in 2006; interrogated Dec 2012  . Cataract extraction w/ intraocular lens  implant, bilateral Bilateral   . Lipoma excision Left 07/2013    wrist  . Pacemaker generator change N/A 01/02/2014    Procedure: PACEMAKER GENERATOR CHANGE;  Surgeon: Sanda Klein, MD;  Location: [place] CATH LAB;  Service: Cardiovascular;  Laterality: N/A;  . Lead revision N/A 01/02/2014    Procedure: LEAD REVISION;  Surgeon: Sanda Klein, MD;  Location: [place] CATH LAB;  Service: Cardiovascular;  Laterality: N/A;  . Subxyphoid pericardial window N/A 01/16/2015    Procedure: SUBXYPHOID PERICARDIAL WINDOW;  Surgeon: Grace Isaac, MD;  Location: [place];  Service: Thoracic;  Laterality: N/A;  . Tee without cardioversion N/A 01/16/2015    Procedure: TRANSESOPHAGEAL ECHOCARDIOGRAM (TEE);  Surgeon: Grace Isaac, MD;  Location: [place];  Service: Thoracic;  Laterality: N/A;   Social History   Social History  . Marital Status: Widowed    Spouse Name: N/A  . Number of Children: N/A  . Years of Education: N/A   Social History Main Topics  . Smoking status: Never Smoker   . Smokeless tobacco: Never Used  . Alcohol Use: 0.0 oz/week    0 Standard drinks or equivalent per week     Comment: 01/02/2014 "I'll have a drink a few times/year"  . Drug Use: No  . Sexual Activity: No   Other Topics Concern  . None   Social History Narrative   Family History  Problem Relation Age of Onset  . Stroke Mother   . Stroke Father   . Pneumonia Father    Allergies  Allergen Reactions  . Iodinated Diagnostic Agents Hives    HIVES 15MIN S/P IV CONTRAST INJECTION,WILL NEED 13 HR PREP FOR FUTURE INJECTIONS, ok s/p 50mg  po benadryl//a.calhoun  . Anti-Inflammatory Enzyme [Nutritional Supplements]     Retains fluids and headaches  . Arthrotec [Diclofenac-Misoprostol] Other (See Comments)    unknown  . Biaxin [Clarithromycin] Other  (See Comments)    unknown  . Plavix [Clopidogrel Bisulfate] Other (See Comments)    unknown  . Spironolactone     Hair loss   Prior to Admission medications   Medication Sig Start Date End Date Taking? Authorizing Provider  Digoxin 62.5 MCG TABS Take 0.0625 mg by mouth daily. 01/26/15  Yes Eileen Stanford, PA-C  potassium chloride (K-DUR,KLOR-CON) 10 MEQ tablet Take 20 mEq by mouth daily. Take with Torsemide   Yes Historical Provider, MD  torsemide (DEMADEX) 20 MG tablet Take 3 tablets (60 mg total) by mouth daily. 01/26/15  Yes Eileen Stanford, PA-C  verapamil (CALAN-SR) 180 MG CR tablet TAKE 1 TABLET(S) BY MOUTH EVERY NIGHT AT  BEDTIME 01/29/15  Yes Mihai Croitoru, MD  warfarin (COUMADIN) 5 MG tablet Take 1 tablet by mouth daily or as directed by coumadin clinic 01/30/15  Yes Mihai Croitoru, MD  metolazone (ZAROXOLYN) 2.5 MG tablet TAKE 1 TABLET 30 MINUTES PRIOR TO TORSEMIDE DOSE ON Wednesday AND Saturday. Patient not taking: Reported on 03/07/2015 02/08/15   Evelene Croon Barrett, PA-C  senna (SENOKOT) 8.6 MG TABS tablet Take 1 tablet by mouth at bedtime as needed for mild constipation. Reported on 03/07/2015    Historical Provider, MD     ROS: The patient denies fevers, chills, night sweats, unintentional weight loss, chest pain, palpitations, wheezing, nausea, vomiting, dysuria, hematuria, numbness, or tingling.   All other systems have been reviewed and were otherwise negative with the exception of those mentioned in the HPI and as above.    PHYSICAL EXAM: Filed Vitals:   03/07/15 1007  BP: 111/68  Pulse: 106  Temp: 97.9 F (36.6 C)  Resp: 16   Body mass index is 24.28 kg/(m^2).   General: Alert, no acute distress HEENT:  Normocephalic, atraumatic, oropharynx patent. Eye: [facility], [facility] Cardiovascular:  Regular rate and rhythm  Respiratory: Clear to auscultation bilaterally.  No wheezes, rales, or rhonchi.  No cyanosis, no use of accessory musculature Abdominal: Grape fruit size  mass, left lower abdomen. Firm to the touch. Musculoskeletal: Gait intact. No edema, tenderness Skin: No rashes. Neurologic: Facial musculature symmetric. Psychiatric: Patient acts appropriately throughout our interaction. Lymphatic: No cervical or submandibular lymphadenopathy     LABS:    EKG/XRAY:   Primary read interpreted by Dr. Everlene Farrier at [facility].   ASSESSMENT/PLAN:   I am very concerned about the mass in her left lower abdomen. This could be stool but her daughter did have a extra luminal cancerous growth. We'll proceed with a CT of the abdomen oral contrast only. In the interim she will be on MiraLAX daily. I called Dr. Perley Jain nurse he is aware of what we are doing.I personally performed the services described in this documentation, which was scribed in my presence. The recorded information has been reviewed and is accurate.  Gross sideeffects, risk and benefits, and alternatives of medications d/w patient. Patient is aware that all medications have potential sideeffects and we are unable to predict every sideeffect or drug-drug interaction that may occur.  [name] MD 03/07/2015 10:20 AM

## 2015-03-07 NOTE — Telephone Encounter (Signed)
Called patient with Dr. Everlene Farrier.

## 2015-03-08 ENCOUNTER — Telehealth: Payer: Self-pay | Admitting: Emergency Medicine

## 2015-03-08 ENCOUNTER — Observation Stay (HOSPITAL_COMMUNITY)
Admission: EM | Admit: 2015-03-08 | Discharge: 2015-03-09 | Disposition: A | Discharge status: Home / Self Care | Payer: Medicare Other | Attending: Family Medicine | Admitting: Family Medicine

## 2015-03-08 ENCOUNTER — Encounter (HOSPITAL_COMMUNITY): Payer: Self-pay | Admitting: Emergency Medicine

## 2015-03-08 ENCOUNTER — Ambulatory Visit
Admission: RE | Admit: 2015-03-08 | Discharge: 2015-03-08 | Disposition: A | Discharge status: Home / Self Care | Payer: Medicare Other | Source: Ambulatory Visit | Attending: Emergency Medicine | Admitting: Emergency Medicine

## 2015-03-08 DIAGNOSIS — I495 Sick sinus syndrome: Secondary | ICD-10-CM | POA: Diagnosis present

## 2015-03-08 DIAGNOSIS — K5733 Diverticulitis of large intestine without perforation or abscess with bleeding: Secondary | ICD-10-CM

## 2015-03-08 DIAGNOSIS — I5033 Acute on chronic diastolic (congestive) heart failure: Secondary | ICD-10-CM | POA: Diagnosis present

## 2015-03-08 DIAGNOSIS — E669 Obesity, unspecified: Secondary | ICD-10-CM | POA: Diagnosis not present

## 2015-03-08 DIAGNOSIS — K5792 Diverticulitis of intestine, part unspecified, without perforation or abscess without bleeding: Secondary | ICD-10-CM | POA: Diagnosis present

## 2015-03-08 DIAGNOSIS — I313 Pericardial effusion (noninflammatory): Secondary | ICD-10-CM | POA: Diagnosis present

## 2015-03-08 DIAGNOSIS — D649 Anemia, unspecified: Secondary | ICD-10-CM | POA: Insufficient documentation

## 2015-03-08 DIAGNOSIS — I110 Hypertensive heart disease with heart failure: Secondary | ICD-10-CM | POA: Diagnosis not present

## 2015-03-08 DIAGNOSIS — I482 Chronic atrial fibrillation, unspecified: Secondary | ICD-10-CM | POA: Diagnosis present

## 2015-03-08 DIAGNOSIS — G4730 Sleep apnea, unspecified: Secondary | ICD-10-CM | POA: Diagnosis not present

## 2015-03-08 DIAGNOSIS — I5032 Chronic diastolic (congestive) heart failure: Secondary | ICD-10-CM | POA: Insufficient documentation

## 2015-03-08 DIAGNOSIS — E119 Type 2 diabetes mellitus without complications: Secondary | ICD-10-CM | POA: Diagnosis not present

## 2015-03-08 DIAGNOSIS — K5900 Constipation, unspecified: Secondary | ICD-10-CM | POA: Diagnosis not present

## 2015-03-08 DIAGNOSIS — R1904 Left lower quadrant abdominal swelling, mass and lump: Secondary | ICD-10-CM

## 2015-03-08 DIAGNOSIS — Z950 Presence of cardiac pacemaker: Secondary | ICD-10-CM | POA: Diagnosis not present

## 2015-03-08 DIAGNOSIS — I3139 Other pericardial effusion (noninflammatory): Secondary | ICD-10-CM | POA: Diagnosis present

## 2015-03-08 DIAGNOSIS — Z8673 Personal history of transient ischemic attack (TIA), and cerebral infarction without residual deficits: Secondary | ICD-10-CM | POA: Insufficient documentation

## 2015-03-08 DIAGNOSIS — K5732 Diverticulitis of large intestine without perforation or abscess without bleeding: Secondary | ICD-10-CM | POA: Diagnosis not present

## 2015-03-08 DIAGNOSIS — R1032 Left lower quadrant pain: Secondary | ICD-10-CM | POA: Diagnosis present

## 2015-03-08 DIAGNOSIS — Z7901 Long term (current) use of anticoagulants: Secondary | ICD-10-CM | POA: Diagnosis not present

## 2015-03-08 DIAGNOSIS — R1030 Lower abdominal pain, unspecified: Secondary | ICD-10-CM

## 2015-03-08 LAB — CBC
HEMATOCRIT: 35.1 % — AB (ref 36.0–46.0)
HEMOGLOBIN: 11 g/dL — AB (ref 12.0–15.0)
MCH: 27.8 pg (ref 26.0–34.0)
MCHC: 31.3 g/dL (ref 30.0–36.0)
MCV: 88.9 fL (ref 78.0–100.0)
Platelets: 314 10*3/uL (ref 150–400)
RBC: 3.95 MIL/uL (ref 3.87–5.11)
RDW: 13.9 % (ref 11.5–15.5)
WBC: 7.7 10*3/uL (ref 4.0–10.5)

## 2015-03-08 LAB — COMPREHENSIVE METABOLIC PANEL
ALT: 22 U/L (ref 14–54)
ANION GAP: 11 (ref 5–15)
AST: 30 U/L (ref 15–41)
Albumin: 3 g/dL — ABNORMAL LOW (ref 3.5–5.0)
Alkaline Phosphatase: 83 U/L (ref 38–126)
BUN: 18 mg/dL (ref 6–20)
CHLORIDE: 97 mmol/L — AB (ref 101–111)
CO2: 30 mmol/L (ref 22–32)
Calcium: 9.4 mg/dL (ref 8.9–10.3)
Creatinine, Ser: 0.88 mg/dL (ref 0.44–1.00)
GFR, EST NON AFRICAN AMERICAN: 56 mL/min — AB (ref >60)
Glucose, Bld: 123 mg/dL — ABNORMAL HIGH (ref 65–99)
POTASSIUM: 3.5 mmol/L (ref 3.5–5.1)
Sodium: 138 mmol/L (ref 135–145)
TOTAL PROTEIN: 6.5 g/dL (ref 6.5–8.1)
Total Bilirubin: 0.5 mg/dL (ref 0.3–1.2)

## 2015-03-08 LAB — C-REACTIVE PROTEIN: CRP: 0.6 mg/dL (ref <1.0)

## 2015-03-08 LAB — LIPASE, BLOOD: LIPASE: 36 U/L (ref 11–51)

## 2015-03-08 MED ORDER — ACETAMINOPHEN 650 MG RE SUPP: 650.0000 mg | Freq: Four times a day (QID) | RECTAL | Status: DC | PRN

## 2015-03-08 MED ORDER — ENOXAPARIN SODIUM 40 MG/0.4ML ~~LOC~~ SOLN
40.0000 mg | SUBCUTANEOUS | Status: DC
  Administered 2015-03-08: 40 mg via SUBCUTANEOUS
  Filled 2015-03-08: qty 0.4

## 2015-03-08 MED ORDER — VERAPAMIL HCL ER 180 MG PO TBCR
180.0000 mg | EXTENDED_RELEASE_TABLET | Freq: Every day | ORAL | Status: DC
Start: 2015-03-08 — End: 2015-03-09
  Filled 2015-03-08 (×2): qty 1

## 2015-03-08 MED ORDER — METRONIDAZOLE IN NACL 5-0.79 MG/ML-% IV SOLN
500.0000 mg | Freq: Three times a day (TID) | INTRAVENOUS | Status: DC
  Administered 2015-03-08 – 2015-03-09 (×2): 500 mg via INTRAVENOUS
  Filled 2015-03-08 (×2): qty 100

## 2015-03-08 MED ORDER — TORSEMIDE 20 MG PO TABS
60.0000 mg | ORAL_TABLET | Freq: Every day | ORAL | Status: DC
  Administered 2015-03-09: 60 mg via ORAL
  Filled 2015-03-08: qty 3

## 2015-03-08 MED ORDER — CIPROFLOXACIN IN D5W 400 MG/200ML IV SOLN
400.0000 mg | Freq: Two times a day (BID) | INTRAVENOUS | Status: DC
  Administered 2015-03-09: 400 mg via INTRAVENOUS
  Filled 2015-03-08: qty 200

## 2015-03-08 MED ORDER — ACETAMINOPHEN 325 MG PO TABS: 650.0000 mg | ORAL_TABLET | Freq: Four times a day (QID) | ORAL | Status: DC | PRN

## 2015-03-08 MED ORDER — INSULIN ASPART 100 UNIT/ML ~~LOC~~ SOLN
0.0000 [IU] | Freq: Three times a day (TID) | SUBCUTANEOUS | Status: DC
  Administered 2015-03-09: 3 [IU] via SUBCUTANEOUS

## 2015-03-08 MED ORDER — DIGOXIN 125 MCG PO TABS
0.0625 mg | ORAL_TABLET | Freq: Every day | ORAL | Status: DC
  Administered 2015-03-09: 0.0625 mg via ORAL
  Filled 2015-03-08 (×2): qty 1

## 2015-03-08 MED ORDER — SODIUM CHLORIDE 0.9% FLUSH
3.0000 mL | Freq: Two times a day (BID) | INTRAVENOUS | Status: DC
  Administered 2015-03-08 – 2015-03-09 (×2): 3 mL via INTRAVENOUS

## 2015-03-08 NOTE — ED Notes (Signed)
Pt reports abd discomfort x 1 month. Pt reports being diagnosed with diverticulitis today by her PCP and was told to come here for IV antibiotics. Pts also reports bleeding with bowel movements. Pt alert x4. NAD at this time.

## 2015-03-08 NOTE — Progress Notes (Addendum)
ANTICOAGULATION CONSULT NOTE - Initial Consult  Pharmacy Consult for warfarin Indication: atrial fibrillation  Allergies  Allergen Reactions  . Iodinated Diagnostic Agents Hives    HIVES 15MIN S/P IV CONTRAST INJECTION,WILL NEED 13 HR PREP FOR FUTURE INJECTIONS, ok s/p 50mg  po benadryl//a.calhoun  . Anti-Inflammatory Enzyme [Nutritional Supplements]     Retains fluids and headaches  . Arthrotec [Diclofenac-Misoprostol] Other (See Comments)    unknown  . Biaxin [Clarithromycin] Other (See Comments)    unknown  . Plavix [Clopidogrel Bisulfate] Other (See Comments)    unknown  . Spironolactone     Hair loss    Patient Measurements:     Vital Signs: Temp: 99.8 F (37.7 C) (02/10 2041) Temp Source: Oral (02/10 2041) BP: 112/53 mmHg (02/10 2041) Pulse Rate: 69 (02/10 2041)  Labs:  Recent Labs  03/08/15 1515  HGB 11.0*  HCT 35.1*  PLT 314  CREATININE 0.88    Estimated Creatinine Clearance: 38.2 mL/min (by C-G formula based on Cr of 0.88).   Medical History: Past Medical History  Diagnosis Date  . Pericardial effusion     a. s/p pericardial window 01/16/15  . Atrial fibrillation (HCC)     Chronic  . Chronic anticoagulation     on Coumadin  . History of GI bleed     once treated with Fe infusion  . HTN (hypertension)   . Pacemaker     a. Medtronic, placed for tachybrady.   . Obesity   . LVH (left ventricular hypertrophy)   . Sleep apnea     mild-no cpap  . Positive TB test   . Anemia   . Stroke [facility])     a. 2013: right frontal  . Arthritis   . Tachycardia-bradycardia syndrome [facility])     a. s/p Medtronic Sensia  model number [id number], serial number [id number] H 12/2013  . History of shingles   . Chronic diastolic (congestive) heart failure ([place])   . Chronic atrial fibrillation (HCC)     a. on coumadin, digoxin and calan  . GI bleed     a. 2/2 AVMs    Assessment: 67 YOF admitted for IV antibiotics for diverticulitis.  She is on warfarin PTA for Afib-  per anticoagulation clinic note from 2/7, she was to be taking 2.5mg  daily except 5mg  on TTSun. Unable to confirm this with patient at this time and she was unable to be awaken. INR was 1.6 on 2/7, however prior to this on 2/1 INR was elevated at 4.1. Patient has been started on ciprofloxacin and Flagyl which can potentiate INR. Hgb 11, plts 314- no bleeding noted  Goal of Therapy:  INR 2-3 Monitor platelets by anticoagulation protocol: Yes   Plan:  -no warfarin tonight as concerned she may have already taken dose today and she is at risk to have supratherapeutic INR -daily INR -follow s/s bleeding -She does have prophylactic Lovenox orders entered- continue for now  Mandell Pangborn D. Abi Shoults, [name], BCPS Clinical Pharmacist Pager: [phone] 03/08/2015 10:21 PM

## 2015-03-08 NOTE — ED Notes (Signed)
Clear liquid diet sent to floor with patient. Delivered just as patient was being transported.

## 2015-03-08 NOTE — Telephone Encounter (Signed)
Patient called. She has an extensive area of diverticulitis involving the sigmoid colon. No definite perforation was seen. Patient will go to the ER at [facility] for their evaluation. I also placed a call to family medicine admission services. I also called triage at [facility] so they are aware patient is on her way there

## 2015-03-08 NOTE — ED Provider Notes (Signed)
CSN: [id number]     Arrival date & time 03/08/15  1351 History   First MD Initiated Contact with Patient 03/08/15 1821     Chief Complaint  Patient presents with  . Abdominal Pain      [name] presenting with abdominal pain. The history is provided by the patient.  Abdominal Pain Associated symptoms: fever   Associated symptoms: no chest pain, no diarrhea, no nausea, no shortness of breath and no vomiting    patient presents with left lower quadrant abdominal pain. Sent in for admission for her diverticulitis. She was having pain for last month with some bleeding. States the pain got worse recently. Had an outpatient CT scan by her primary care doctor. She had fevers a few weeks ago but not in the last couple days. Mildly decreased appetite. States she has had blood in the stool for a while now. Recent admission a month or 2 ago for pericardial effusion and pneumonia.  Past Medical History  Diagnosis Date  . Pericardial effusion     a. s/p pericardial window 01/16/15  . Atrial fibrillation (HCC)     Chronic  . Chronic anticoagulation     on Coumadin  . History of GI bleed     once treated with Fe infusion  . HTN (hypertension)   . Pacemaker     a. Medtronic, placed for tachybrady.   . Obesity   . LVH (left ventricular hypertrophy)   . Sleep apnea     mild-no cpap  . Positive TB test   . Anemia   . Stroke [facility])     a. 2013: right frontal  . Arthritis   . Tachycardia-bradycardia syndrome [facility])     a. s/p Medtronic Sensia  model number [id number], serial number [id number] H 12/2013  . History of shingles   . Chronic diastolic (congestive) heart failure ([place])   . Chronic atrial fibrillation (HCC)     a. on coumadin, digoxin and calan  . GI bleed     a. 2/2 AVMs   Past Surgical History  Procedure Laterality Date  . Breast lumpectomy Bilateral     negative for cancer  . Esophagogastroduodenoscopy  05/19/2011    Procedure: ESOPHAGOGASTRODUODENOSCOPY (EGD);   Surgeon: Lear Ng, MD;  Location: [facility] ENDOSCOPY;  Service: Endoscopy;  Laterality: N/A;  doctor aware of inr   will try to be here no later than 230  . Cholecystectomy  2005  . Esophagogastroduodenoscopy  06/22/2011    Procedure: ESOPHAGOGASTRODUODENOSCOPY (EGD);  Surgeon: Arta Silence, MD;  Location: [facility] ENDOSCOPY;  Service: Endoscopy;  Laterality: N/A;  Check PT/INR in am  . Givens capsule study  06/23/2011    Procedure: GIVENS CAPSULE STUDY;  Surgeon: Arta Silence, MD;  Location: [facility] ENDOSCOPY;  Service: Endoscopy;  Laterality: N/A;  . Colonoscopy  08/11/2011    Procedure: COLONOSCOPY;  Surgeon: Jeryl Columbia, MD;  Location: [facility] ENDOSCOPY;  Service: Endoscopy;  Laterality: N/A;  . Hot hemostasis  08/11/2011    Procedure: HOT HEMOSTASIS (ARGON PLASMA COAGULATION/BICAP);  Surgeon: Jeryl Columbia, MD;  Location: [facility] ENDOSCOPY;  Service: Endoscopy;  Laterality: N/A;  . Mass excision Left 09/14/2013    Procedure: EXCISION MASS LEFT WRIST;  Surgeon: Leanora Cover, MD;  Location: [place];  Service: Orthopedics;  Laterality: Left;  . Tonsillectomy and adenoidectomy  1950  . Insert / replace / remove pacemaker  2001    Last generator in 2006; interrogated Dec 2012  . Cataract  extraction w/ intraocular lens  implant, bilateral Bilateral   . Lipoma excision Left 07/2013    wrist  . Pacemaker generator change N/A 01/02/2014    Procedure: PACEMAKER GENERATOR CHANGE;  Surgeon: Sanda Klein, MD;  Location: [place] CATH LAB;  Service: Cardiovascular;  Laterality: N/A;  . Lead revision N/A 01/02/2014    Procedure: LEAD REVISION;  Surgeon: Sanda Klein, MD;  Location: [place] CATH LAB;  Service: Cardiovascular;  Laterality: N/A;  . Subxyphoid pericardial window N/A 01/16/2015    Procedure: SUBXYPHOID PERICARDIAL WINDOW;  Surgeon: Grace Isaac, MD;  Location: [place];  Service: Thoracic;  Laterality: N/A;  . Tee without cardioversion N/A 01/16/2015    Procedure: TRANSESOPHAGEAL ECHOCARDIOGRAM  (TEE);  Surgeon: Grace Isaac, MD;  Location: [facility] OR;  Service: Thoracic;  Laterality: N/A;   Family History  Problem Relation Age of Onset  . Stroke Mother   . Stroke Father   . Pneumonia Father    Social History  Substance Use Topics  . Smoking status: Never Smoker   . Smokeless tobacco: Never Used  . Alcohol Use: 0.0 oz/week    0 Standard drinks or equivalent per week     Comment: 01/02/2014 "I'll have a drink a few times/year"   OB History    No data available     Review of Systems  Constitutional: Positive for fever and appetite change. Negative for activity change.  Eyes: Negative for pain.  Respiratory: Negative for chest tightness and shortness of breath.   Cardiovascular: Negative for chest pain and leg swelling.  Gastrointestinal: Positive for abdominal pain and blood in stool. Negative for nausea, vomiting and diarrhea.  Genitourinary: Negative for flank pain.  Musculoskeletal: Negative for back pain and neck stiffness.  Skin: Negative for rash.  Neurological: Negative for weakness, numbness and headaches.  Psychiatric/Behavioral: Negative for behavioral problems.      Allergies  Iodinated diagnostic agents; Anti-inflammatory enzyme; Arthrotec; Biaxin; Plavix; and Spironolactone  Home Medications   Prior to Admission medications   Medication Sig Start Date End Date Taking? Authorizing Provider  Digoxin 62.5 MCG TABS Take 0.0625 mg by mouth daily. 01/26/15   Eileen Stanford, PA-C  metolazone (ZAROXOLYN) 2.5 MG tablet TAKE 1 TABLET 30 MINUTES PRIOR TO TORSEMIDE DOSE ON Wednesday AND Saturday. Patient not taking: Reported on 03/07/2015 02/08/15   Evelene Croon Barrett, PA-C  potassium chloride (K-DUR,KLOR-CON) 10 MEQ tablet Take 20 mEq by mouth daily. Take with Torsemide    Historical Provider, MD  senna (SENOKOT) 8.6 MG TABS tablet Take 1 tablet by mouth at bedtime as needed for mild constipation. Reported on 03/07/2015    Historical Provider, MD  torsemide  (DEMADEX) 20 MG tablet Take 3 tablets (60 mg total) by mouth daily. 01/26/15   Eileen Stanford, PA-C  verapamil (CALAN-SR) 180 MG CR tablet TAKE 1 TABLET(S) BY MOUTH EVERY NIGHT AT BEDTIME 01/29/15   Mihai Croitoru, MD  warfarin (COUMADIN) 5 MG tablet Take 1 tablet by mouth daily or as directed by coumadin clinic 01/30/15   Mihai Croitoru, MD   BP 124/60 mmHg  Pulse 92  Temp(Src) 98.2 F (36.8 C) (Oral)  Resp 16  SpO2 94% Physical Exam  Constitutional: She is oriented to person, place, and time. She appears well-developed and well-nourished.  HENT:  Head: Normocephalic and atraumatic.  Eyes: EOM are normal. Pupils are equal, round, and reactive to light.  Neck: Normal range of motion. Neck supple.  Cardiovascular: Normal rate, regular rhythm and normal heart sounds.  No murmur heard. Pulmonary/Chest: Effort normal and breath sounds normal. No respiratory distress. She has no wheezes. She has no rales.  Abdominal: Soft. She exhibits no distension. There is tenderness. There is no rebound and no guarding.  Moderate left lower quadrant tenderness.  Musculoskeletal: Normal range of motion.  Neurological: She is alert and oriented to person, place, and time. No cranial nerve deficit.  Skin: Skin is warm and dry.  Psychiatric: She has a normal mood and affect. Her speech is normal.  Nursing note and vitals reviewed.   ED Course  Procedures (including critical care time) Labs Review Labs Reviewed  COMPREHENSIVE METABOLIC PANEL - Abnormal; Notable for the following:    Chloride 97 (*)    Glucose, Bld 123 (*)    Albumin 3.0 (*)    GFR calc non Af Amer 56 (*)    All other components within normal limits  CBC - Abnormal; Notable for the following:    Hemoglobin 11.0 (*)    HCT 35.1 (*)    All other components within normal limits  LIPASE, BLOOD    Imaging Review Ct Abdomen Pelvis Wo Contrast  03/08/2015  CLINICAL DATA:  Abdominal mass left lower quadrant found on physical  examination with abdominal pain for several months EXAM: CT ABDOMEN AND PELVIS WITHOUT CONTRAST TECHNIQUE: Multidetector CT imaging of the abdomen and pelvis was performed following the standard protocol without IV contrast. COMPARISON:  06/26/2011 FINDINGS: Lower chest: In large pericardial effusion is again identified. 3 mm pulmonary nodule image number 7 right lung base. This is stable from 2013 and therefore benign. Hepatobiliary: Status post cholecystectomy Pancreas: Negative Spleen: Negative Adrenals/Urinary Tract: Multiple bilateral renal cysts of varying size and attenuation similar to prior study. Mm low attenuation cysts lateral inter pole are left kidney represents change from prior study and is not fully evaluated without contrast but shows average attenuation of 0 consistent with cysts. No hydronephrosis. Bladder normal. Stomach/Bowel: Stomach and small bowel are normal. Appendix is normal. There is extensive diverticulosis beginning at the junction of the descending and sigmoid colon and continuing through out the proximal half of the sigmoid colon. No oral contrast is seen within the sigmoid colon, and in the absence of oral and IV contrast evaluation is limited. There is clearly severe wall thickening involving the proximal half of the sigmoid colon with surrounding inflammatory change. There is a small volume of free fluid in the left pelvis in this area. Mild wall thickening continues into the distal sigmoid colon. Vascular/Lymphatic: Extensive atherosclerotic aortoiliac calcification. Numerous small nonpathologic mesenteric lymph nodes. Reproductive: No significant abnormalities Other: No evidence of abscess. Small volume free fluid left pelvis related to the sigmoid colon inflammatory process. Musculoskeletal: Diffuse osteopenia.  No acute findings. IMPRESSION: Evidence of diverticulitis involving the sigmoid colon. Electronically Signed   By: Skipper Cliche M.D.   On: 03/08/2015 10:56   I  have personally reviewed and evaluated these images and lab results as part of my medical decision-making.   EKG Interpretation None      MDM   Final diagnoses:  Diverticulitis of large intestine without perforation or abscess with bleeding    * patient with diverticulitis. 82 years old. Will admit to family practice.  Davonna Belling, MD 03/08/15 [phone]

## 2015-03-08 NOTE — Progress Notes (Addendum)
Pharmacy Antibiotic Note  [name] is a 82 y.o. female admitted on 03/08/2015 with abdominal infection.  Pharmacy has been consulted for ciprofloxacin dosing.   Plan: -ciprofloxacin 400mg  IV q12h -metronidazole 500mg  IV q8h per MD   Temp (24hrs), Avg:99 F (37.2 C), Min:98.2 F (36.8 C), Max:99.8 F (37.7 C)   Recent Labs Lab 03/08/15 1515  WBC 7.7  CREATININE 0.88    Estimated Creatinine Clearance: 38.2 mL/min (by C-G formula based on Cr of 0.88).    Allergies  Allergen Reactions  . Iodinated Diagnostic Agents Hives    HIVES 15MIN S/P IV CONTRAST INJECTION,WILL NEED 13 HR PREP FOR FUTURE INJECTIONS, ok s/p 50mg  po benadryl//a.calhoun  . Anti-Inflammatory Enzyme [Nutritional Supplements]     Retains fluids and headaches  . Arthrotec [Diclofenac-Misoprostol] Other (See Comments)    unknown  . Biaxin [Clarithromycin] Other (See Comments)    unknown  . Plavix [Clopidogrel Bisulfate] Other (See Comments)    unknown  . Spironolactone     Hair loss    Antimicrobials this admission: cipro 2/10 >>  Flagyl 2/10 >>   Dose adjustments this admission: n/a  Microbiology results: none  Thank you for allowing pharmacy to be a part of this patient's care.  Rochanda Harpham D. Harry Shuck, [name], BCPS Clinical Pharmacist Pager: [phone] 03/08/2015 9:12 PM

## 2015-03-08 NOTE — H&P (Signed)
[facility] Admission History and Physical Service Pager: [phone]  Patient name: [name] Medical record number: [id number] Date of birth: 07/13/1923 Age: 82 y.o. Gender: female  Primary Care Provider: [name], MD Consultants: None  Code Status: Full   Chief Complaint: Abdominal Pain   Assessment and Plan: [name] is a 82 y.o. female presenting with abdominal pain.  PMH is significant for atrial fibrillation, hx of CVA, hypertension, type 2 diabetes  #Left Lower Quadrant Abdominal pain: One week history of worsening left lower quadrant pain and well as trace bright red blood per rectum on tissue paper (chronic, but worsened). Patient with hx of AVMs cauterized in 2013 during colonoscopy, also hx of hemorrhoids. Stable vitals. WBC wnls. CT with evidence of diverticulitis of sigmoid colon. Lower quadrant pain differential includes acute cystitis versus acute colitis versus diverticulitis. Patient denies any history of dysuria or history of diarrhea making cystitis or colitis unlikely. Furthermore, imaging and history points towards diverticulitis.  - Admit to inpatient observation, Dr. Gwendlyn Deutscher  - Vitals per floor  - Start Metronidazole and Ciprofloxacin IV, anticipate quick transition to oral antibiotics - Will get CRP to monitor for improvement  - Will obtain UA  - Obtain AM CBC and BMET - Bowel rest with only clear liquids - Strict I's and O's - Consider adding fluids if patient has decreased urine output or inadequate PO intake  #Anemia: hgb 11.0 (baseline 11.5). Long-term history of rectal bleeding, small drops of bright red blood per rectum per this admission. With history of hemorrhoids. - Will continue to follow with AM CBC as bright red bleeding per rectum - will need outpatient GI f/u if no significant bleeding while she is here  # Atrial fibrillation: ECHO EF 0000000, no diastolic dysfunction. - Continue Verapamil and Digoxin   - Continue Coumadin per pharmacy  # Type 2 diabetes mellitus: A1C 6.2   - SSI - Continue ACQHS  #Lower extremity edema: Questionable diagnosis of heart failure, ECHO in January 2017 w/ normal EF and no diastolic dysfunction, some moderate valvular disease - continue home Torsemide, may need to hold if PO intake low on clears  FEN/GI: Clear liquid diet  Prophylaxis: SCDs (on warfarin)   Disposition: Observation on telemetry, home pending clinical improvement  History of Present Illness:  [name] is a 82 y.o. female presenting with left lower quadrant pain. Patient states that she has had a long time history of  constipation and was seen by GI in September for this issue. More recently,patient has been having left lower quadrant pain of about a week and half's duration. Patient states that she was seen by Dr. Sondra Come last week for this pain. Pain has since continued to worsen she is also also states that she has had fevers between 99.6 and 100.8. Patient states she has had colonoscopies in the past, where she has had blood vessels cauterized. She also indicates that she is currently having bright red blood per rectum she states that this blood is noted. Patient takes blood thinners for her A. Fib. Agent most recently states she has had several bowel movements today due to the oral contrast. Per Dr. Everlene Farrier, patient contacted Dr. Watt Climes earlier last week, and was told to take Miralax, however patient has remained constipated. Surgical history of abdomen: Cholecystectomy  Review Of Systems: Per HPI with the following additions: Review of Systems  Constitutional: Positive for fever. Negative for chills.  HENT: Negative for congestion.  Respiratory: Negative for cough and shortness of breath.   Cardiovascular: Positive for palpitations and leg swelling. Negative for chest pain and claudication.  Gastrointestinal: Positive for abdominal pain, constipation and blood in stool. Negative for nausea  and vomiting.  Genitourinary: Negative for dysuria and urgency.  Neurological: Negative for dizziness, focal weakness and headaches.    Otherwise the remainder of the systems were negative.  Patient Active Problem List   Diagnosis Date Noted  . Obesity   . Chronic atrial fibrillation ([place])   . HCAP (healthcare-associated pneumonia)   . Upper airway cough syndrome   . Acute bronchitis   . Acute on chronic diastolic CHF (congestive heart failure), NYHA class 3 ([place]) 01/15/2015  . Dyspnea 05/22/2014  . Tachycardia-bradycardia syndrome with symptomatic bradycardia 01/03/2014  . Pacemaker battery depletion 12/28/2013  . History of CVA (cerebrovascular accident) 10/25/2013  . Diabetes ([place]) 08/03/2013  . Myofacial muscle pain 10/16/2011  . Back pain, Left Flank 07/01/2011  . Melena 06/20/2011  . Acute blood loss anemia 06/20/2011  . Cerebral embolism with cerebral infarction ([place]) 06/12/2011  . CVA (cerebral infarction) 05/20/2011  . GI bleed 05/19/2011  . Weakness of left side of body 05/16/2011  . Elevated digoxin level 05/16/2011  . Pacemaker 05/21/2010  . Edema 04/24/2010  . Pericardial effusion, Large   . Chronic anticoagulation - Coumadin, CHADS2VASC=7   . HTN (hypertension)   . Atrial fibrillation ([place]) 04/14/2010    Past Medical History: Past Medical History  Diagnosis Date  . Pericardial effusion     a. s/p pericardial window 01/16/15  . Atrial fibrillation (H[facility])     Chronic  . Chronic anticoagulation     on Coumadin  . History of GI bleed     once treated with Fe infusion  . HTN (hypertension)   . Pacemaker     a. Medtronic, placed for tachybrady.   . Obesity   . LVH (left ventricular hypertrophy)   . Sleep apnea     mild-no cpap  . Positive TB test   . Anemia   . Stroke [facility])     a. 2013: right frontal  . Arthritis   . Tachycardia-bradycardia syndrome [facility])     a. s/p Medtronic Sensia  model number [id number], serial number [id number] H 12/2013  . History  of shingles   . Chronic diastolic (congestive) heart failure ([place])   . Chronic atrial fibrillation (HCC)     a. on coumadin, digoxin and calan  . GI bleed     a. 2/2 AVMs    Past Surgical History: Past Surgical History  Procedure Laterality Date  . Breast lumpectomy Bilateral     negative for cancer  . Esophagogastroduodenoscopy  05/19/2011    Procedure: ESOPHAGOGASTRODUODENOSCOPY (EGD);  Surgeon: Lear Ng, MD;  Location: [facility] ENDOSCOPY;  Service: Endoscopy;  Laterality: N/A;  doctor aware of inr   will try to be here no later than 230  . Cholecystectomy  2005  . Esophagogastroduodenoscopy  06/22/2011    Procedure: ESOPHAGOGASTRODUODENOSCOPY (EGD);  Surgeon: Arta Silence, MD;  Location: [facility] ENDOSCOPY;  Service: Endoscopy;  Laterality: N/A;  Check PT/INR in am  . Givens capsule study  06/23/2011    Procedure: GIVENS CAPSULE STUDY;  Surgeon: Arta Silence, MD;  Location: [facility] ENDOSCOPY;  Service: Endoscopy;  Laterality: N/A;  . Colonoscopy  08/11/2011    Procedure: COLONOSCOPY;  Surgeon: Jeryl Columbia, MD;  Location: [facility] ENDOSCOPY;  Service: Endoscopy;  Laterality: N/A;  . Hot hemostasis  08/11/2011  Procedure: HOT HEMOSTASIS (ARGON PLASMA COAGULATION/BICAP);  Surgeon: Jeryl Columbia, MD;  Location: [facility] ENDOSCOPY;  Service: Endoscopy;  Laterality: N/A;  . Mass excision Left 09/14/2013    Procedure: EXCISION MASS LEFT WRIST;  Surgeon: Leanora Cover, MD;  Location: [place];  Service: Orthopedics;  Laterality: Left;  . Tonsillectomy and adenoidectomy  1950  . Insert / replace / remove pacemaker  2001    Last generator in 2006; interrogated Dec 2012  . Cataract extraction w/ intraocular lens  implant, bilateral Bilateral   . Lipoma excision Left 07/2013    wrist  . Pacemaker generator change N/A 01/02/2014    Procedure: PACEMAKER GENERATOR CHANGE;  Surgeon: Sanda Klein, MD;  Location: [place] CATH LAB;  Service: Cardiovascular;  Laterality: N/A;  . Lead revision N/A 01/02/2014     Procedure: LEAD REVISION;  Surgeon: Sanda Klein, MD;  Location: [place] CATH LAB;  Service: Cardiovascular;  Laterality: N/A;  . Subxyphoid pericardial window N/A 01/16/2015    Procedure: SUBXYPHOID PERICARDIAL WINDOW;  Surgeon: Grace Isaac, MD;  Location: [place];  Service: Thoracic;  Laterality: N/A;  . Tee without cardioversion N/A 01/16/2015    Procedure: TRANSESOPHAGEAL ECHOCARDIOGRAM (TEE);  Surgeon: Grace Isaac, MD;  Location: [facility] OR;  Service: Thoracic;  Laterality: N/A;    Social History: Social History  Substance Use Topics  . Smoking status: Never Smoker   . Smokeless tobacco: Never Used  . Alcohol Use: 0.0 oz/week    0 Standard drinks or equivalent per week     Comment: 01/02/2014 "I'll have a drink a few times/year"   Additional social history:none Please also refer to relevant sections of EMR.  Family History: Family History  Problem Relation Age of Onset  . Stroke Mother   . Stroke Father   . Pneumonia Father     Allergies and Medications: Allergies  Allergen Reactions  . Iodinated Diagnostic Agents Hives    HIVES 15MIN S/P IV CONTRAST INJECTION,WILL NEED 13 HR PREP FOR FUTURE INJECTIONS, ok s/p 50mg  po benadryl//a.calhoun  . Anti-Inflammatory Enzyme [Nutritional Supplements]     Retains fluids and headaches  . Arthrotec [Diclofenac-Misoprostol] Other (See Comments)    unknown  . Biaxin [Clarithromycin] Other (See Comments)    unknown  . Plavix [Clopidogrel Bisulfate] Other (See Comments)    unknown  . Spironolactone     Hair loss   No current facility-administered medications on file prior to encounter.   Current Outpatient Prescriptions on File Prior to Encounter  Medication Sig Dispense Refill  . Digoxin 62.5 MCG TABS Take 0.0625 mg by mouth daily. 30 tablet 6  . metolazone (ZAROXOLYN) 2.5 MG tablet TAKE 1 TABLET 30 MINUTES PRIOR TO TORSEMIDE DOSE ON Wednesday AND Saturday. (Patient not taking: Reported on 03/07/2015) 30 tablet 3  . potassium  chloride (K-DUR,KLOR-CON) 10 MEQ tablet Take 20 mEq by mouth daily. Take with Torsemide    . senna (SENOKOT) 8.6 MG TABS tablet Take 1 tablet by mouth at bedtime as needed for mild constipation. Reported on 03/07/2015    . torsemide (DEMADEX) 20 MG tablet Take 3 tablets (60 mg total) by mouth daily. 180 tablet 6  . verapamil (CALAN-SR) 180 MG CR tablet TAKE 1 TABLET(S) BY MOUTH EVERY NIGHT AT BEDTIME 30 tablet 6  . warfarin (COUMADIN) 5 MG tablet Take 1 tablet by mouth daily or as directed by coumadin clinic 90 tablet 1    Objective: BP 124/60 mmHg  Pulse 92  Temp(Src) 98.2 F (36.8 C) (Oral)  Resp 16  SpO2 94% Exam: General: Lying in bed , NAD  Eyes: PERRL, EOMI  ENTM: Moist mucosa membranes  Neck: No lymphadenopathy, cervical or supraclavicular  Cardiovascular: RRR, holosystolic murmur, no rubs, or gallops  Respiratory: CTAB, no wheezes or rhonchi  Abdomen: BS+, TTP in LLQ and suprapubic region, voluntary guarding on palpation of LLQ, no rebound  MSK: Lower extremities wrapped due to patient hx of leg swelling  Skin:  No sores or rashes  Neuro: Upper and lower extremity strength  Equal and intact, no sensory changes  Psych: Cognition and judgment appear intact.   Labs and Imaging: CBC BMET   Recent Labs Lab 03/08/15 1515  WBC 7.7  HGB 11.0*  HCT 35.1*  PLT 314    Recent Labs Lab 03/08/15 1515  NA 138  K 3.5  CL 97*  CO2 30  BUN 18  CREATININE 0.88  GLUCOSE 123*  CALCIUM 9.4     Ct Abdomen Pelvis Wo Contrast  03/08/2015  CLINICAL DATA:  Abdominal mass left lower quadrant found on physical examination with abdominal pain for several months EXAM: CT ABDOMEN AND PELVIS WITHOUT CONTRAST TECHNIQUE: Multidetector CT imaging of the abdomen and pelvis was performed following the standard protocol without IV contrast. COMPARISON:  06/26/2011 FINDINGS: Lower chest: In large pericardial effusion is again identified. 3 mm pulmonary nodule image number 7 right lung base. This is  stable from 2013 and therefore benign. Hepatobiliary: Status post cholecystectomy Pancreas: Negative Spleen: Negative Adrenals/Urinary Tract: Multiple bilateral renal cysts of varying size and attenuation similar to prior study. Mm low attenuation cysts lateral inter pole are left kidney represents change from prior study and is not fully evaluated without contrast but shows average attenuation of 0 consistent with cysts. No hydronephrosis. Bladder normal. Stomach/Bowel: Stomach and small bowel are normal. Appendix is normal. There is extensive diverticulosis beginning at the junction of the descending and sigmoid colon and continuing through out the proximal half of the sigmoid colon. No oral contrast is seen within the sigmoid colon, and in the absence of oral and IV contrast evaluation is limited. There is clearly severe wall thickening involving the proximal half of the sigmoid colon with surrounding inflammatory change. There is a small volume of free fluid in the left pelvis in this area. Mild wall thickening continues into the distal sigmoid colon. Vascular/Lymphatic: Extensive atherosclerotic aortoiliac calcification. Numerous small nonpathologic mesenteric lymph nodes. Reproductive: No significant abnormalities Other: No evidence of abscess. Small volume free fluid left pelvis related to the sigmoid colon inflammatory process. Musculoskeletal: Diffuse osteopenia.  No acute findings. IMPRESSION: Evidence of diverticulitis involving the sigmoid colon. Electronically Signed   By: Skipper Cliche M.D.   On: 03/08/2015 10:56     [name], MD 03/08/2015, 6:41 PM PGY-1, [facility] Upper-Level Resident Addendum  I have independently interviewed and examined the patient. I have discussed the above with the original author and agree with their documentation. My edits for correction/addition/clarification are in pink. Please see also any attending notes.   Frazier Richards,  MD MPH PGY-3, [facility] Service pager: [phone] (text pages welcome through [facility])

## 2015-03-09 DIAGNOSIS — K5733 Diverticulitis of large intestine without perforation or abscess with bleeding: Secondary | ICD-10-CM

## 2015-03-09 LAB — BASIC METABOLIC PANEL
Anion gap: 7 (ref 5–15)
BUN: 15 mg/dL (ref 6–20)
CO2: 31 mmol/L (ref 22–32)
CREATININE: 1.01 mg/dL — AB (ref 0.44–1.00)
Calcium: 9 mg/dL (ref 8.9–10.3)
Chloride: 97 mmol/L — ABNORMAL LOW (ref 101–111)
GFR calc Af Amer: 55 mL/min — ABNORMAL LOW (ref >60)
GFR, EST NON AFRICAN AMERICAN: 48 mL/min — AB (ref >60)
Glucose, Bld: 117 mg/dL — ABNORMAL HIGH (ref 65–99)
Potassium: 3.6 mmol/L (ref 3.5–5.1)
SODIUM: 135 mmol/L (ref 135–145)

## 2015-03-09 LAB — CBC
HCT: 33.1 % — ABNORMAL LOW (ref 36.0–46.0)
Hemoglobin: 11 g/dL — ABNORMAL LOW (ref 12.0–15.0)
MCH: 29.5 pg (ref 26.0–34.0)
MCHC: 33.2 g/dL (ref 30.0–36.0)
MCV: 88.7 fL (ref 78.0–100.0)
PLATELETS: 310 10*3/uL (ref 150–400)
RBC: 3.73 MIL/uL — ABNORMAL LOW (ref 3.87–5.11)
RDW: 14.1 % (ref 11.5–15.5)
WBC: 6.6 10*3/uL (ref 4.0–10.5)

## 2015-03-09 LAB — PROTIME-INR
INR: 2.08 — AB (ref 0.00–1.49)
Prothrombin Time: 23.2 seconds — ABNORMAL HIGH (ref 11.6–15.2)

## 2015-03-09 LAB — GLUCOSE, CAPILLARY
GLUCOSE-CAPILLARY: 114 mg/dL — AB (ref 65–99)
GLUCOSE-CAPILLARY: 229 mg/dL — AB (ref 65–99)

## 2015-03-09 LAB — C-REACTIVE PROTEIN: CRP: 0.6 mg/dL (ref <1.0)

## 2015-03-09 MED ORDER — ONDANSETRON 4 MG PO TBDP: 4.0000 mg | ORAL_TABLET | Freq: Three times a day (TID) | ORAL | Status: DC | PRN

## 2015-03-09 MED ORDER — CIPROFLOXACIN HCL 500 MG PO TABS: 500.0000 mg | ORAL_TABLET | Freq: Two times a day (BID) | ORAL | Status: DC

## 2015-03-09 MED ORDER — METRONIDAZOLE 500 MG PO TABS
500.0000 mg | ORAL_TABLET | Freq: Two times a day (BID) | ORAL | Status: DC
  Administered 2015-03-09: 500 mg via ORAL
  Filled 2015-03-09: qty 1

## 2015-03-09 MED ORDER — CIPROFLOXACIN HCL 500 MG PO TABS
500.0000 mg | ORAL_TABLET | Freq: Two times a day (BID) | ORAL | Status: DC
Start: 2015-03-09 — End: 2015-03-09
  Administered 2015-03-09: 500 mg via ORAL
  Filled 2015-03-09: qty 1

## 2015-03-09 MED ORDER — WARFARIN SODIUM 2.5 MG PO TABS: 2.5000 mg | ORAL_TABLET | Freq: Once | ORAL | Status: DC

## 2015-03-09 MED ORDER — METRONIDAZOLE 500 MG PO TABS
500.0000 mg | ORAL_TABLET | Freq: Two times a day (BID) | ORAL | Status: DC
Start: 2015-03-09 — End: 2015-03-20

## 2015-03-09 MED ORDER — WARFARIN - PHARMACIST DOSING INPATIENT: Freq: Every day | Status: DC

## 2015-03-09 NOTE — Progress Notes (Signed)
Family Medicine Teaching Service Daily Progress Note Intern Pager: [phone]  Patient name: [name] Medical record number: [id number] Date of birth: [birth year] Age: 82 y.o. Gender: female  Primary Care Provider: [name], MD Consultants: none Code Status: full  Pt Overview and Major Events to Date:  2/10 - admitted for diverticulitis  Assessment and Plan: [name] is a 82 y.o. female presenting with abdominal pain. PMH is significant for atrial fibrillation, hx of CVA, hypertension, type 2 diabetes  #Left Lower Quadrant Abdominal pain: One week history of worsening left lower quadrant pain and well as trace bright red blood per rectum on tissue paper (chronic, but worsened). Stable vitals. WBC wnls. CT with evidence of diverticulitis of sigmoid colon.   - Change Metronidazole and Ciprofloxacin to PO - Obtain AM CBC and BMET - advance diet to full liquids, encouraged increased po fluids - Strict I's and O's - Consider adding fluids if patient has decreased urine output or inadequate PO intake  #Anemia: hgb 11.0 (baseline 11.5). Long-term history of rectal bleeding, small drops of bright red blood per rectum per this admission. With history of hemorrhoids. No bleeding since admission - Will continue to follow with AM CBC - will need outpatient GI f/u if no significant bleeding while she is here  # Atrial fibrillation: ECHO EF 0000000, no diastolic dysfunction. - Continue Verapamil and Digoxin  - Continue Coumadin per pharmacy  # Type 2 diabetes mellitus: A1C 6.2  - SSI - Continue ACQHS  #Lower extremity edema: Questionable diagnosis of heart failure, ECHO in January 2017 w/ normal EF and no diastolic dysfunction, some moderate valvular disease - continue home Torsemide, may need to hold if PO intake low   FEN/GI: Clear liquid diet  Prophylaxis: SCDs (on warfarin)   Disposition: home pending clinical improvement  Subjective:  Reports mild nausea and  abdominal pain but overall doing well  Objective: Temp:  [98 F (36.7 C)-99.8 F (37.7 C)] 98 F (36.7 C) (02/11 0519) Pulse Rate:  [61-92] 61 (02/11 0519) Resp:  [16-19] 19 (02/10 2041) BP: (107-126)/(47-64) 114/47 mmHg (02/11 0519) SpO2:  [93 %-100 %] 93 % (02/11 0519) Weight:  [146 lb 13.2 oz (66.6 kg)] 146 lb 13.2 oz (66.6 kg) (02/11 0107) Physical Exam: General: Lying in bed , NAD  Eyes: PERRL, EOMI  ENTM: Moist mucosa membranes  Neck: No lymphadenopathy, cervical or supraclavicular  Cardiovascular: RRR, holosystolic murmur, no rubs, or gallops  Respiratory: CTAB, no wheezes or rhonchi  Abdomen: BS+, TTP in LLQ and suprapubic region, no rebound or guarding MSK: Lower extremities wrapped due to patient hx of leg swelling  Skin: No sores or rashes  Psych: Alert and oriented. Cognition and judgment appear intact.    Laboratory:  Recent Labs Lab 03/08/15 1515  WBC 7.7  HGB 11.0*  HCT 35.1*  PLT 314    Recent Labs Lab 03/08/15 1515  NA 138  K 3.5  CL 97*  CO2 30  BUN 18  CREATININE 0.88  CALCIUM 9.4  PROT 6.5  BILITOT 0.5  ALKPHOS 83  ALT 22  AST 30  GLUCOSE 123*    Imaging/Diagnostic Tests: Ct Abdomen Pelvis Wo Contrast  03/08/2015  CLINICAL DATA:  Abdominal mass left lower quadrant found on physical examination with abdominal pain for several months EXAM: CT ABDOMEN AND PELVIS WITHOUT CONTRAST TECHNIQUE: Multidetector CT imaging of the abdomen and pelvis was performed following the standard protocol without IV contrast. COMPARISON:  06/26/2011 FINDINGS: Lower chest: In large  pericardial effusion is again identified. 3 mm pulmonary nodule image number 7 right lung base. This is stable from 2013 and therefore benign. Hepatobiliary: Status post cholecystectomy Pancreas: Negative Spleen: Negative Adrenals/Urinary Tract: Multiple bilateral renal cysts of varying size and attenuation similar to prior study. Mm low attenuation cysts lateral inter pole are  left kidney represents change from prior study and is not fully evaluated without contrast but shows average attenuation of 0 consistent with cysts. No hydronephrosis. Bladder normal. Stomach/Bowel: Stomach and small bowel are normal. Appendix is normal. There is extensive diverticulosis beginning at the junction of the descending and sigmoid colon and continuing through out the proximal half of the sigmoid colon. No oral contrast is seen within the sigmoid colon, and in the absence of oral and IV contrast evaluation is limited. There is clearly severe wall thickening involving the proximal half of the sigmoid colon with surrounding inflammatory change. There is a small volume of free fluid in the left pelvis in this area. Mild wall thickening continues into the distal sigmoid colon. Vascular/Lymphatic: Extensive atherosclerotic aortoiliac calcification. Numerous small nonpathologic mesenteric lymph nodes. Reproductive: No significant abnormalities Other: No evidence of abscess. Small volume free fluid left pelvis related to the sigmoid colon inflammatory process. Musculoskeletal: Diffuse osteopenia.  No acute findings. IMPRESSION: Evidence of diverticulitis involving the sigmoid colon. Electronically Signed   By: Skipper Cliche M.D.   On: 03/08/2015 10:56     Frazier Richards, MD 03/09/2015, 8:51 AM PGY-3, [place] Intern pager: [phone], text pages welcome

## 2015-03-09 NOTE — Discharge Instructions (Signed)
Please advance your diet slowly as tolerated. You may take Zofran for nausea. Please make sure you stay hydrated to prevent future hospitalizations.   I have prescribed two antibiotics for you to take twice a day for 14 days. One of these antibiotics can sometimes affect Warfarin level, so please have your INR checked Monday, 03/11/15 so it can be adjusted if needed.   Diverticulitis Diverticulitis is inflammation or infection of small pouches in your colon that form when you have a condition called diverticulosis. The pouches in your colon are called diverticula. Your colon, or large intestine, is where water is absorbed and stool is formed. Complications of diverticulitis can include:  Bleeding.  Severe infection.  Severe pain.  Perforation of your colon.  Obstruction of your colon. CAUSES  Diverticulitis is caused by bacteria. Diverticulitis happens when stool becomes trapped in diverticula. This allows bacteria to grow in the diverticula, which can lead to inflammation and infection. RISK FACTORS People with diverticulosis are at risk for diverticulitis. Eating a diet that does not include enough fiber from fruits and vegetables may make diverticulitis more likely to develop. SYMPTOMS  Symptoms of diverticulitis may include:  Abdominal pain and tenderness. The pain is normally located on the left side of the abdomen, but may occur in other areas.  Fever and chills.  Bloating.  Cramping.  Nausea.  Vomiting.  Constipation.  Diarrhea.  Blood in your stool. DIAGNOSIS  Your health care provider will ask you about your medical history and do a physical exam. You may need to have tests done because many medical conditions can cause the same symptoms as diverticulitis. Tests may include:  Blood tests.  Urine tests.  Imaging tests of the abdomen, including X-rays and CT scans. When your condition is under control, your health care provider may recommend that you have a  colonoscopy. A colonoscopy can show how severe your diverticula are and whether something else is causing your symptoms. TREATMENT  Most cases of diverticulitis are mild and can be treated at home. Treatment may include:  Taking over-the-counter pain medicines.  Following a clear liquid diet.  Taking antibiotic medicines by mouth for 7-10 days. More severe cases may be treated at a hospital. Treatment may include:  Not eating or drinking.  Taking prescription pain medicine.  Receiving antibiotic medicines through an IV tube.  Receiving fluids and nutrition through an IV tube.  Surgery. HOME CARE INSTRUCTIONS   Follow your health care provider's instructions carefully.  Follow a full liquid diet or other diet as directed by your health care provider. After your symptoms improve, your health care provider may tell you to change your diet. He or she may recommend you eat a high-fiber diet. Fruits and vegetables are good sources of fiber. Fiber makes it easier to pass stool.  Take fiber supplements or probiotics as directed by your health care provider.  Only take medicines as directed by your health care provider.  Keep all your follow-up appointments. SEEK MEDICAL CARE IF:   Your pain does not improve.  You have a hard time eating food.  Your bowel movements do not return to normal. SEEK IMMEDIATE MEDICAL CARE IF:   Your pain becomes worse.  Your symptoms do not get better.  Your symptoms suddenly get worse.  You have a fever.  You have repeated vomiting.  You have bloody or black, tarry stools. MAKE SURE YOU:   Understand these instructions.  Will watch your condition.  Will get help right away if  you are not doing well or get worse.   This information is not intended to replace advice given to you by your health care provider. Make sure you discuss any questions you have with your health care provider.   Document Released: 10/22/2004 Document Revised:  01/17/2013 Document Reviewed: 12/07/2012 Elsevier Interactive Patient Education [employer].

## 2015-03-09 NOTE — Progress Notes (Signed)
[facility] for warfarin Indication: atrial fibrillation  Allergies  Allergen Reactions  . Iodinated Diagnostic Agents Hives    HIVES 15MIN S/P IV CONTRAST INJECTION,WILL NEED 13 HR PREP FOR FUTURE INJECTIONS, ok s/p 50mg  po benadryl//a.calhoun  . Anti-Inflammatory Enzyme [Nutritional Supplements]     Retains fluids and headaches  . Arthrotec [Diclofenac-Misoprostol] Other (See Comments)    unknown  . Biaxin [Clarithromycin] Other (See Comments)    unknown  . Plavix [Clopidogrel Bisulfate] Other (See Comments)    unknown  . Spironolactone     Hair loss    Patient Measurements: Weight: 146 lb 13.2 oz (66.6 kg)   Vital Signs: Temp: 98 F (36.7 C) (02/11 0519) Temp Source: Oral (02/11 0519) BP: 114/47 mmHg (02/11 0519) Pulse Rate: 61 (02/11 0519)  Labs:  Recent Labs  03/08/15 1515 03/09/15 0830  HGB 11.0* 11.0*  HCT 35.1* 33.1*  PLT 314 310  LABPROT  --  23.2*  INR  --  2.08*  CREATININE 0.88 1.01*    Estimated Creatinine Clearance: 33.3 mL/min (by C-G formula based on Cr of 1.01).   Assessment: 40 YOF admitted for antibiotics for diverticulitis.  She is on warfarin PTA for Afib- taking 2.5mg  daily except 5mg  on TTSun, last dose PTA was 2/10. INR this morning 2.08. Note: patient has been started on ciprofloxacin and Flagyl which can potentiate INR.  Hgb 11, plts 314- bleeding per rectum has not occurred since admission  Goal of Therapy:  INR 2-3 Monitor platelets by anticoagulation protocol: Yes   Plan:  -warfarin 2.5mg  po x1 tonight as per home dose -stop prophylactic Lovenox as INR is therapeutic -daily INR -follow s/s bleeding  Sarae Nicholes D. Octavian Godek, [name], BCPS Clinical Pharmacist Pager: [phone] 03/09/2015 11:04 AM

## 2015-03-09 NOTE — Progress Notes (Signed)
Discharge instructions reviewed with patient and daughter.  These included the following:  Prescriptions  (method and times of administration), when to call the MD, mobility restrictions and needs, and symptoms to watch for while on Coumadin.  Ascertained comprehension of instructions via "teach-back"Patient discharged to private residence with daughter via private vehicle.  Escorted to exit via wheelchair by Probation officer.

## 2015-03-11 NOTE — Discharge Summary (Signed)
[facility] Discharge Summary  Patient name: [name] Medical record number: [id number] Date of birth: [birth year] Age: 82 y.o. Gender: female Date of Admission: 03/08/2015  Date of Discharge: 03/09/2015 Admitting Physician: Kinnie Feil, MD  Primary Care Provider: [name], MD Consultants: None  Indication for Hospitalization: Diverticulitis   Discharge Diagnoses/Problem List:  Atrial fibrillation Hx of CVA Hypertension Type 2 diabetes  Disposition: Home   Discharge Condition: Stable   Discharge Exam:  General: Lying in bed , NAD  Eyes: PERRL, EOMI  ENTM: Moist mucosa membranes  Neck: No lymphadenopathy, cervical or supraclavicular  Cardiovascular: RRR, holosystolic murmur, no rubs, or gallops  Respiratory: CTAB, no wheezes or rhonchi  Abdomen: BS+, TTP in LLQ and suprapubic region, no rebound or guarding MSK: Lower extremities wrapped due to patient hx of leg swelling  Skin: No sores or rashes  Psych: Alert and oriented. Cognition and judgment appear intact.   Brief Hospital Course:  Patient presenting with left lower quadrant pain as well as well as trace bright red blood per rectum on tissue paper (chronic, but worsened). Patient with hx of AVMs cauterized in 2013 during colonoscopy, also hx of hemorrhoids. Stable vital and WBC within normal limits on admission. CT with evidence of diverticulitis of sigmoid colon. Lower quadrant pain differential includes acute cystitis versus acute colitis versus diverticulitis. Patient denies any history of dysuria or history of diarrhea making cystitis or colitis unlikely. Furthermore, imaging and history points towards diverticulitis. Patient started on IV antibiotics and bowel rest with only clear liquids. Patient improved and was transitioned to PO antibiotics with advancing of diet.   Issues for Follow Up:  1. Continue PO antibiotics, metronidazole and ciprofloxacin for 7 days.   2. Provided Zofran for nausea  3. Follow up on patient's abdominal pain  4. GI follow up for rectal bleeding, likely due to hemorrhoids   Significant Procedures:  Ct Abdomen Pelvis Wo Contrast  03/08/2015  CLINICAL DATA:  Abdominal mass left lower quadrant found on physical examination with abdominal pain for several months EXAM: CT ABDOMEN AND PELVIS WITHOUT CONTRAST TECHNIQUE: Multidetector CT imaging of the abdomen and pelvis was performed following the standard protocol without IV contrast. COMPARISON:  06/26/2011 FINDINGS: Lower chest: In large pericardial effusion is again identified. 3 mm pulmonary nodule image number 7 right lung base. This is stable from 2013 and therefore benign. Hepatobiliary: Status post cholecystectomy Pancreas: Negative Spleen: Negative Adrenals/Urinary Tract: Multiple bilateral renal cysts of varying size and attenuation similar to prior study. Mm low attenuation cysts lateral inter pole are left kidney represents change from prior study and is not fully evaluated without contrast but shows average attenuation of 0 consistent with cysts. No hydronephrosis. Bladder normal. Stomach/Bowel: Stomach and small bowel are normal. Appendix is normal. There is extensive diverticulosis beginning at the junction of the descending and sigmoid colon and continuing through out the proximal half of the sigmoid colon. No oral contrast is seen within the sigmoid colon, and in the absence of oral and IV contrast evaluation is limited. There is clearly severe wall thickening involving the proximal half of the sigmoid colon with surrounding inflammatory change. There is a small volume of free fluid in the left pelvis in this area. Mild wall thickening continues into the distal sigmoid colon. Vascular/Lymphatic: Extensive atherosclerotic aortoiliac calcification. Numerous small nonpathologic mesenteric lymph nodes. Reproductive: No significant abnormalities Other: No evidence of abscess. Small  volume free fluid left pelvis related to the sigmoid colon  inflammatory process. Musculoskeletal: Diffuse osteopenia.  No acute findings. IMPRESSION: Evidence of diverticulitis involving the sigmoid colon. Electronically Signed   By: Skipper Cliche M.D.   On: 03/08/2015 10:56     Significant Labs and Imaging:   Recent Labs Lab 03/08/15 1515 03/09/15 0830  WBC 7.7 6.6  HGB 11.0* 11.0*  HCT 35.1* 33.1*  PLT 314 310    Recent Labs Lab 03/08/15 1515 03/09/15 0830  NA 138 135  K 3.5 3.6  CL 97* 97*  CO2 30 31  GLUCOSE 123* 117*  BUN 18 15  CREATININE 0.88 1.01*  CALCIUM 9.4 9.0  ALKPHOS 83  --   AST 30  --   ALT 22  --   ALBUMIN 3.0*  --     Results/Tests Pending at Time of Discharge: None  Discharge Medications:    Medication List    TAKE these medications        ciprofloxacin 500 MG tablet  Commonly known as:  CIPRO  Take 1 tablet (500 mg total) by mouth 2 (two) times daily.  Notes to Patient:  Please take with food     Digoxin 62.5 MCG Tabs  Commonly known as:  DIGITEK  Take 0.0625 mg by mouth daily.  Notes to Patient:  Notify doctor for pulse less than 60     metolazone 2.5 MG tablet  Commonly known as:  ZAROXOLYN  TAKE 1 TABLET 30 MINUTES PRIOR TO TORSEMIDE DOSE ON Wednesday AND Saturday.  Notes to Patient:  Take Torsemide 30 minutes after you take this medication     metroNIDAZOLE 500 MG tablet  Commonly known as:  FLAGYL  Take 1 tablet (500 mg total) by mouth 2 (two) times daily.  Notes to Patient:  Notify doctor for signs of thrush or yeast infection.  Take with food.     ondansetron 4 MG disintegrating tablet  Commonly known as:  ZOFRAN ODT  Take 1 tablet (4 mg total) by mouth every 8 (eight) hours as needed for nausea or vomiting.     polyethylene glycol packet  Commonly known as:  MIRALAX / GLYCOLAX  Take 17 g by mouth daily.  Notes to Patient:  Hold if having diarrhea     potassium chloride 10 MEQ tablet  Commonly known as:   K-DUR,KLOR-CON  Take 20 mEq by mouth daily. Take with Torsemide     torsemide 20 MG tablet  Commonly known as:  DEMADEX  Take 3 tablets (60 mg total) by mouth daily.     verapamil 180 MG CR tablet  Commonly known as:  CALAN-SR  TAKE 1 TABLET(S) BY MOUTH EVERY NIGHT AT BEDTIME     warfarin 5 MG tablet  Commonly known as:  COUMADIN  Take 1 tablet by mouth daily or as directed by coumadin clinic  Notes to Patient:  Note any signs / symptoms of bleeding.        Discharge Instructions: Please refer to Patient Instructions section of EMR for full details.  Patient was counseled important signs and symptoms that should prompt return to medical care, changes in medications, dietary instructions, activity restrictions, and follow up appointments.   Follow-Up Appointments:     Follow-up Information    Follow up with DAUB, Lina Sayre, MD.   Specialty:  Family Medicine   Why:  Please follow up in 2-3 days for [facility] Follow Up   Contact information:   [place] [phone]       Keatyn Luck Zahra  Emmaline Life, MD 03/11/2015, 10:12 PM PGY-1, [place]

## 2015-03-12 LAB — POCT INR: INR: 1.8

## 2015-03-13 ENCOUNTER — Ambulatory Visit (INDEPENDENT_AMBULATORY_CARE_PROVIDER_SITE_OTHER): Payer: Medicare Other | Admitting: Pharmacist Clinician (PhC)/ Clinical Pharmacy Specialist

## 2015-03-13 DIAGNOSIS — Z7901 Long term (current) use of anticoagulants: Secondary | ICD-10-CM

## 2015-03-13 DIAGNOSIS — I480 Paroxysmal atrial fibrillation: Secondary | ICD-10-CM

## 2015-03-13 DIAGNOSIS — I6310 Cerebral infarction due to embolism of unspecified precerebral artery: Secondary | ICD-10-CM

## 2015-03-15 LAB — POCT INR: INR: 2

## 2015-03-18 ENCOUNTER — Ambulatory Visit (INDEPENDENT_AMBULATORY_CARE_PROVIDER_SITE_OTHER): Payer: Medicare Other | Admitting: Pharmacist Clinician (PhC)/ Clinical Pharmacy Specialist

## 2015-03-18 DIAGNOSIS — Z7901 Long term (current) use of anticoagulants: Secondary | ICD-10-CM

## 2015-03-18 DIAGNOSIS — I6310 Cerebral infarction due to embolism of unspecified precerebral artery: Secondary | ICD-10-CM

## 2015-03-18 DIAGNOSIS — I480 Paroxysmal atrial fibrillation: Secondary | ICD-10-CM

## 2015-03-19 ENCOUNTER — Encounter: Payer: Self-pay | Admitting: Cardiovascular Disease

## 2015-03-20 ENCOUNTER — Ambulatory Visit (INDEPENDENT_AMBULATORY_CARE_PROVIDER_SITE_OTHER): Payer: Medicare Other | Admitting: Cardiovascular Disease

## 2015-03-20 ENCOUNTER — Encounter: Payer: Self-pay | Admitting: Cardiovascular Disease

## 2015-03-20 VITALS — BP 118/58 | HR 67 | Ht 65.0 in | Wt 148.0 lb

## 2015-03-20 DIAGNOSIS — I482 Chronic atrial fibrillation, unspecified: Secondary | ICD-10-CM

## 2015-03-20 DIAGNOSIS — Z950 Presence of cardiac pacemaker: Secondary | ICD-10-CM

## 2015-03-20 DIAGNOSIS — Z8673 Personal history of transient ischemic attack (TIA), and cerebral infarction without residual deficits: Secondary | ICD-10-CM

## 2015-03-20 DIAGNOSIS — Z7901 Long term (current) use of anticoagulants: Secondary | ICD-10-CM

## 2015-03-20 DIAGNOSIS — I313 Pericardial effusion (noninflammatory): Secondary | ICD-10-CM

## 2015-03-20 DIAGNOSIS — I319 Disease of pericardium, unspecified: Secondary | ICD-10-CM

## 2015-03-20 DIAGNOSIS — I3139 Other pericardial effusion (noninflammatory): Secondary | ICD-10-CM

## 2015-03-20 DIAGNOSIS — I6310 Cerebral infarction due to embolism of unspecified precerebral artery: Secondary | ICD-10-CM

## 2015-03-20 DIAGNOSIS — I10 Essential (primary) hypertension: Secondary | ICD-10-CM

## 2015-03-20 MED ORDER — DIGOXIN 62.5 MCG PO TABS: 0.0625 mg | ORAL_TABLET | Freq: Every day | ORAL | Status: DC

## 2015-03-20 NOTE — Patient Instructions (Signed)
Remote monitoring is used to monitor your Pacemaker from home. This monitoring reduces the number of office visits required to check your device to one time per year. It allows [place] to monitor the functioning of your device to ensure it is working properly. You are scheduled for a device check from home on Jun 18, 2015. You may send your transmission at any time that day. If you have a wireless device, the transmission will be sent automatically. After your physician reviews your transmission, you will receive a postcard with your next transmission date.   Dr. Sallyanne Kuster recommends that you schedule a follow-up appointment in: [place]

## 2015-03-20 NOTE — Progress Notes (Signed)
Patient ID: [name], female   DOB: Sep 12, 1923, 82 y.o.   MRN: [id number]     Cardiology Office Note    Date:  03/20/2015   ID:  [name], DOB 10/04/1923, MRN [id number]  PCP:  [name], MD  Cardiologist:   Sanda Klein, MD   Chief Complaint  Patient presents with  . Follow-up  . Edema    History of Present Illness:  [name] is a 82 y.o. female with long-standing permanent atrial fibrillation with slow ventricular response, single chamber pacemaker, remote history of embolic stroke, hypertensive heart disease with left ventricular hypertrophy, returning in follow-up after undergoing a pericardial window for a very large pericardial effusion just before Christmas. Her recovery has been slow, not unexpected at age 28. Recovery has been further delayed due to an episode of diverticulitis that required hospitalization earlier this month. Her edema is substantially improved compared to last year, but just yesterday she noticed a 5 pound weight gain in a little worsening of the edema. She vows to never take metolazone again since it caused such dramatic diuresis last time. She denies syncope, but often feels unsteady on her feet and feels that her legs are weak, possible deconditioning from her lengthy recovery. She has not had any falls, injuries or bleeding problems. She does not have any new neurological complaints. She is troubled by rectal prolapse.  Echo follow-up January 26 shows a trivial residual pericardial effusion.  Pacemaker check today shows normal device function. Lead parameters are normal. She has 64% paced rhythm and 36% ventricular sensed rhythm and permanent atrial fibrillation. There have been no episodes of high ventricular rate. The heart rate histogram appears favorable.  Past Medical History  Diagnosis Date  . Pericardial effusion     a. s/p pericardial window 01/16/15  . Atrial fibrillation (HCC)     Chronic  . Chronic anticoagulation       on Coumadin  . History of GI bleed     once treated with Fe infusion  . HTN (hypertension)   . Pacemaker     a. Medtronic, placed for tachybrady.   . Obesity   . LVH (left ventricular hypertrophy)   . Sleep apnea     mild-no cpap  . Positive TB test   . Anemia   . Stroke [facility])     a. 2013: right frontal  . Arthritis   . Tachycardia-bradycardia syndrome [facility])     a. s/p Medtronic Sensia  model number [id number], serial number [id number] H 12/2013  . History of shingles   . Chronic diastolic (congestive) heart failure ([place])   . Chronic atrial fibrillation (HCC)     a. on coumadin, digoxin and calan  . GI bleed     a. 2/2 AVMs    Past Surgical History  Procedure Laterality Date  . Breast lumpectomy Bilateral     negative for cancer  . Esophagogastroduodenoscopy  05/19/2011    Procedure: ESOPHAGOGASTRODUODENOSCOPY (EGD);  Surgeon: Lear Ng, MD;  Location: [facility] ENDOSCOPY;  Service: Endoscopy;  Laterality: N/A;  doctor aware of inr   will try to be here no later than 230  . Cholecystectomy  2005  . Esophagogastroduodenoscopy  06/22/2011    Procedure: ESOPHAGOGASTRODUODENOSCOPY (EGD);  Surgeon: Arta Silence, MD;  Location: [facility] ENDOSCOPY;  Service: Endoscopy;  Laterality: N/A;  Check PT/INR in am  . Givens capsule study  06/23/2011    Procedure: GIVENS CAPSULE STUDY;  Surgeon: Arta Silence, MD;  Location: [facility] ENDOSCOPY;  Service: Endoscopy;  Laterality: N/A;  . Colonoscopy  08/11/2011    Procedure: COLONOSCOPY;  Surgeon: Jeryl Columbia, MD;  Location: [facility] ENDOSCOPY;  Service: Endoscopy;  Laterality: N/A;  . Hot hemostasis  08/11/2011    Procedure: HOT HEMOSTASIS (ARGON PLASMA COAGULATION/BICAP);  Surgeon: Jeryl Columbia, MD;  Location: [facility] ENDOSCOPY;  Service: Endoscopy;  Laterality: N/A;  . Mass excision Left 09/14/2013    Procedure: EXCISION MASS LEFT WRIST;  Surgeon: Leanora Cover, MD;  Location: [place];  Service: Orthopedics;  Laterality: Left;  . Tonsillectomy and  adenoidectomy  1950  . Insert / replace / remove pacemaker  2001    Last generator in 2006; interrogated Dec 2012  . Cataract extraction w/ intraocular lens  implant, bilateral Bilateral   . Lipoma excision Left 07/2013    wrist  . Pacemaker generator change N/A 01/02/2014    Procedure: PACEMAKER GENERATOR CHANGE;  Surgeon: Sanda Klein, MD;  Location: [place] CATH LAB;  Service: Cardiovascular;  Laterality: N/A;  . Lead revision N/A 01/02/2014    Procedure: LEAD REVISION;  Surgeon: Sanda Klein, MD;  Location: [facility] CATH LAB;  Service: Cardiovascular;  Laterality: N/A;  . Subxyphoid pericardial window N/A 01/16/2015    Procedure: SUBXYPHOID PERICARDIAL WINDOW;  Surgeon: Grace Isaac, MD;  Location: [place];  Service: Thoracic;  Laterality: N/A;  . Tee without cardioversion N/A 01/16/2015    Procedure: TRANSESOPHAGEAL ECHOCARDIOGRAM (TEE);  Surgeon: Grace Isaac, MD;  Location: [place];  Service: Thoracic;  Laterality: N/A;    Outpatient Prescriptions Prior to Visit  Medication Sig Dispense Refill  . ciprofloxacin (CIPRO) 500 MG tablet Take 1 tablet (500 mg total) by mouth 2 (two) times daily. 27 tablet 0  . Digoxin 62.5 MCG TABS Take 0.0625 mg by mouth daily, 5 days a week, skip Wednesday and Sunday  30 tablet 6  . metolazone (ZAROXOLYN) 2.5 MG tablet TAKE 1 TABLET 30 MINUTES PRIOR TO TORSEMIDE DOSE ON Wednesday AND Saturday. 30 tablet 3  . ondansetron (ZOFRAN ODT) 4 MG disintegrating tablet Take 1 tablet (4 mg total) by mouth every 8 (eight) hours as needed for nausea or vomiting. 20 tablet 0  . polyethylene glycol (MIRALAX / GLYCOLAX) packet Take 17 g by mouth daily.    . potassium chloride (K-DUR,KLOR-CON) 10 MEQ tablet Take 20 mEq by mouth daily. Take with Torsemide    . torsemide (DEMADEX) 20 MG tablet Take 3 tablets (60 mg total) by mouth daily. 180 tablet 6  . verapamil (CALAN-SR) 180 MG CR tablet TAKE 1 TABLET(S) BY MOUTH EVERY NIGHT AT BEDTIME 30 tablet 6  . warfarin (COUMADIN) 5  MG tablet Take 1 tablet by mouth daily or as directed by coumadin clinic (Patient taking differently: 2.5-5 mg. Monday Wednesday Friday Saturday (2.5 mg ) 5 mg all other days) 90 tablet 1  . metroNIDAZOLE (FLAGYL) 500 MG tablet Take 1 tablet (500 mg total) by mouth 2 (two) times daily. 27 tablet 0   No facility-administered medications prior to visit.     Allergies:   Iodinated diagnostic agents; Anti-inflammatory enzyme; Arthrotec; Biaxin; Plavix; and Spironolactone   Social History   Social History  . Marital Status: Widowed    Spouse Name: N/A  . Number of Children: N/A  . Years of Education: N/A   Social History Main Topics  . Smoking status: Never Smoker   . Smokeless tobacco: Never Used  . Alcohol Use: 0.0 oz/week    0 Standard drinks or equivalent per week  Comment: 01/02/2014 "I'll have a drink a few times/year"  . Drug Use: No  . Sexual Activity: No   Other Topics Concern  . None   Social History Narrative     Family History:  The patient's family history includes Pneumonia in her father; Stroke in her father and mother.   ROS:   Please see the history of present illness.    ROS All other systems reviewed and are negative.   PHYSICAL EXAM:   VS:  BP 118/58 mmHg  Pulse 67  Ht 5\' 5"  (1.651 m)  Wt 67.132 kg (148 lb)  BMI 24.63 kg/m2   GEN: Well nourished, well developed, in no acute distress HEENT: normal Neck: no JVD, carotid bruits, or masses Cardiac: irregular; no murmurs, rubs, or gallops, 1+ symmetrical pretibial edema ; healthy right subclavian device site Respiratory:  clear to auscultation bilaterally, normal work of breathing GI: soft, nontender, nondistended, + BS MS: no deformity or atrophy Skin: warm and dry, no rash Neuro:  Alert and Oriented x 3, Strength and sensation are intact Psych: euthymic mood, full affect  Wt Readings from Last 3 Encounters:  03/20/15 67.132 kg (148 lb)  03/09/15 66.6 kg (146 lb 13.2 oz)  03/07/15 66.18 kg (145  lb 14.4 oz)      Studies/Labs Reviewed:   EKG:  EKG is ordered today.  The ekg ordered today demonstrates 100% ventricular paced rhythm with a background of atrial fibrillation. The QTC is 519 ms  Recent Labs: 01/08/2015: Magnesium 2.0 01/15/2015: TSH 1.989 01/22/2015: B Natriuretic Peptide 356.3* 03/08/2015: ALT 22 03/09/2015: BUN 15; Creatinine, Ser 1.01*; Hemoglobin 11.0*; Platelets 310; Potassium 3.6; Sodium 135   Lipid Panel    Component Value Date/Time   CHOL 160 08/03/2013 1155   TRIG 114 08/03/2013 1155   HDL 37* 08/03/2013 1155   CHOLHDL 4.3 08/03/2013 1155   VLDL 23 08/03/2013 1155   LDLCALC 100* 08/03/2013 1155      ASSESSMENT:    1. Chronic atrial fibrillation ([place])   2. Pacemaker   3. History of CVA (cerebrovascular accident)   4. Chronic anticoagulation - Coumadin, CHADS2VASC=7   5. Essential hypertension   6. Pericardial effusion, Large      PLAN:  In order of problems listed above:  1. AFib: Reasonable balance between rate control and trying to minimize ventricular pacing. Consider stopping the digoxin altogether in the future 2. PPM: normal device function 3. CVA: High risk of recurrent embolic events, interruption of warfarin should be done with "bridging" 4. Warfarin anticoagulation, without bleeding complications 5. HTN: Good blood pressure control 6. S/p her cardio window December 2016, trivial residual effusion    Medication Adjustments/Labs and Tests Ordered: Current medicines are reviewed at length with the patient today.  Concerns regarding medicines are outlined above.  Medication changes, Labs and Tests ordered today are listed in the Patient Instructions below. Patient Instructions  Remote monitoring is used to monitor your Pacemaker from home. This monitoring reduces the number of office visits required to check your device to one time per year. It allows [place] to monitor the functioning of your device to ensure it is working properly. You  are scheduled for a device check from home on Jun 18, 2015. You may send your transmission at any time that day. If you have a wireless device, the transmission will be sent automatically. After your physician reviews your transmission, you will receive a postcard with your next transmission date.   Dr. Sallyanne Kuster recommends that you  schedule a follow-up appointment in: ONE YEAR        SignedSanda Klein, MD  03/20/2015 1:16 PM    [facility] [address], [place], [place]  [address] Phone: [phone]; Fax: [phone]

## 2015-03-24 ENCOUNTER — Encounter: Payer: Self-pay | Admitting: Emergency Medicine

## 2015-03-26 ENCOUNTER — Ambulatory Visit (INDEPENDENT_AMBULATORY_CARE_PROVIDER_SITE_OTHER): Payer: Medicare Other | Admitting: Pharmacist Clinician (PhC)/ Clinical Pharmacy Specialist

## 2015-03-26 ENCOUNTER — Other Ambulatory Visit: Payer: Self-pay | Admitting: Cardiovascular Disease

## 2015-03-26 DIAGNOSIS — Z7901 Long term (current) use of anticoagulants: Secondary | ICD-10-CM

## 2015-03-26 DIAGNOSIS — I6310 Cerebral infarction due to embolism of unspecified precerebral artery: Secondary | ICD-10-CM

## 2015-03-26 DIAGNOSIS — I480 Paroxysmal atrial fibrillation: Secondary | ICD-10-CM

## 2015-03-26 LAB — POCT INR: INR: 2.5

## 2015-03-26 NOTE — Telephone Encounter (Signed)
REFILL 

## 2015-03-27 ENCOUNTER — Ambulatory Visit: Payer: Medicare Other | Admitting: Cardiovascular Disease

## 2015-04-03 ENCOUNTER — Emergency Department (HOSPITAL_COMMUNITY): Payer: Medicare Other

## 2015-04-03 ENCOUNTER — Ambulatory Visit (INDEPENDENT_AMBULATORY_CARE_PROVIDER_SITE_OTHER): Payer: Medicare Other | Admitting: Emergency Medicine

## 2015-04-03 ENCOUNTER — Inpatient Hospital Stay (HOSPITAL_COMMUNITY)
Admission: EM | Admit: 2015-04-03 | Discharge: 2015-04-11 | DRG: 392 | Disposition: A | Discharge status: Home / Self Care | Payer: Medicare Other | Attending: Family Medicine | Admitting: Family Medicine

## 2015-04-03 VITALS — BP 116/60 | HR 82 | Temp 98.3°F | Resp 16 | Ht 65.0 in | Wt 145.8 lb

## 2015-04-03 DIAGNOSIS — R1032 Left lower quadrant pain: Secondary | ICD-10-CM | POA: Diagnosis not present

## 2015-04-03 DIAGNOSIS — Z8379 Family history of other diseases of the digestive system: Secondary | ICD-10-CM

## 2015-04-03 DIAGNOSIS — R109 Unspecified abdominal pain: Secondary | ICD-10-CM | POA: Insufficient documentation

## 2015-04-03 DIAGNOSIS — I872 Venous insufficiency (chronic) (peripheral): Secondary | ICD-10-CM | POA: Diagnosis present

## 2015-04-03 DIAGNOSIS — Z8719 Personal history of other diseases of the digestive system: Secondary | ICD-10-CM

## 2015-04-03 DIAGNOSIS — K5900 Constipation, unspecified: Secondary | ICD-10-CM | POA: Diagnosis present

## 2015-04-03 DIAGNOSIS — I482 Chronic atrial fibrillation: Secondary | ICD-10-CM | POA: Diagnosis present

## 2015-04-03 DIAGNOSIS — D500 Iron deficiency anemia secondary to blood loss (chronic): Secondary | ICD-10-CM | POA: Diagnosis present

## 2015-04-03 DIAGNOSIS — I6310 Cerebral infarction due to embolism of unspecified precerebral artery: Secondary | ICD-10-CM

## 2015-04-03 DIAGNOSIS — R791 Abnormal coagulation profile: Secondary | ICD-10-CM | POA: Insufficient documentation

## 2015-04-03 DIAGNOSIS — K5732 Diverticulitis of large intestine without perforation or abscess without bleeding: Secondary | ICD-10-CM | POA: Diagnosis not present

## 2015-04-03 DIAGNOSIS — I6340 Cerebral infarction due to embolism of unspecified cerebral artery: Secondary | ICD-10-CM | POA: Diagnosis present

## 2015-04-03 DIAGNOSIS — Z823 Family history of stroke: Secondary | ICD-10-CM

## 2015-04-03 DIAGNOSIS — Z7901 Long term (current) use of anticoagulants: Secondary | ICD-10-CM

## 2015-04-03 DIAGNOSIS — Z950 Presence of cardiac pacemaker: Secondary | ICD-10-CM | POA: Diagnosis present

## 2015-04-03 DIAGNOSIS — Z9049 Acquired absence of other specified parts of digestive tract: Secondary | ICD-10-CM

## 2015-04-03 DIAGNOSIS — I10 Essential (primary) hypertension: Secondary | ICD-10-CM | POA: Insufficient documentation

## 2015-04-03 DIAGNOSIS — Z8673 Personal history of transient ischemic attack (TIA), and cerebral infarction without residual deficits: Secondary | ICD-10-CM

## 2015-04-03 DIAGNOSIS — K5792 Diverticulitis of intestine, part unspecified, without perforation or abscess without bleeding: Secondary | ICD-10-CM | POA: Diagnosis present

## 2015-04-03 DIAGNOSIS — E119 Type 2 diabetes mellitus without complications: Secondary | ICD-10-CM

## 2015-04-03 DIAGNOSIS — I480 Paroxysmal atrial fibrillation: Secondary | ICD-10-CM | POA: Insufficient documentation

## 2015-04-03 DIAGNOSIS — A047 Enterocolitis due to Clostridium difficile: Secondary | ICD-10-CM | POA: Diagnosis present

## 2015-04-03 DIAGNOSIS — Z800 Family history of malignant neoplasm of digestive organs: Secondary | ICD-10-CM

## 2015-04-03 LAB — COMPREHENSIVE METABOLIC PANEL
ALT: 24 U/L (ref 14–54)
ANION GAP: 11 (ref 5–15)
AST: 34 U/L (ref 15–41)
Albumin: 3.2 g/dL — ABNORMAL LOW (ref 3.5–5.0)
Alkaline Phosphatase: 69 U/L (ref 38–126)
BILIRUBIN TOTAL: 0.7 mg/dL (ref 0.3–1.2)
BUN: 13 mg/dL (ref 6–20)
CALCIUM: 9.1 mg/dL (ref 8.9–10.3)
CO2: 31 mmol/L (ref 22–32)
Chloride: 98 mmol/L — ABNORMAL LOW (ref 101–111)
Creatinine, Ser: 1.11 mg/dL — ABNORMAL HIGH (ref 0.44–1.00)
GFR calc Af Amer: 49 mL/min — ABNORMAL LOW (ref >60)
GFR, EST NON AFRICAN AMERICAN: 42 mL/min — AB (ref >60)
Glucose, Bld: 95 mg/dL (ref 65–99)
POTASSIUM: 3.1 mmol/L — AB (ref 3.5–5.1)
Sodium: 140 mmol/L (ref 135–145)
TOTAL PROTEIN: 6.8 g/dL (ref 6.5–8.1)

## 2015-04-03 LAB — URINE MICROSCOPIC-ADD ON

## 2015-04-03 LAB — POCT CBC
Granulocyte percent: 83.8 %G — AB (ref 37–80)
HEMATOCRIT: 34.1 % — AB (ref 37.7–47.9)
HEMOGLOBIN: 11.8 g/dL — AB (ref 12.2–16.2)
LYMPH, POC: 1.8 (ref 0.6–3.4)
MCH, POC: 29.3 pg (ref 27–31.2)
MCHC: 34.6 g/dL (ref 31.8–35.4)
MCV: 84.6 fL (ref 80–97)
MID (cbc): 0.3 (ref 0–0.9)
MPV: 7.6 fL (ref 0–99.8)
POC GRANULOCYTE: 11.1 — AB (ref 2–6.9)
POC LYMPH PERCENT: 13.7 %L (ref 10–50)
POC MID %: 2.5 %M (ref 0–12)
Platelet Count, POC: 339 10*3/uL (ref 142–424)
RBC: 4.03 M/uL — AB (ref 4.04–5.48)
RDW, POC: 13.5 %
WBC: 13.3 10*3/uL — AB (ref 4.6–10.2)

## 2015-04-03 LAB — URINALYSIS, ROUTINE W REFLEX MICROSCOPIC
BILIRUBIN URINE: NEGATIVE
Glucose, UA: NEGATIVE mg/dL
KETONES UR: NEGATIVE mg/dL
Leukocytes, UA: NEGATIVE
NITRITE: NEGATIVE
Protein, ur: NEGATIVE mg/dL
SPECIFIC GRAVITY, URINE: 1.009 (ref 1.005–1.030)
pH: 7.5 (ref 5.0–8.0)

## 2015-04-03 LAB — CBC WITH DIFFERENTIAL/PLATELET
BASOS ABS: 0 10*3/uL (ref 0.0–0.1)
BASOS PCT: 0 %
EOS PCT: 0 %
Eosinophils Absolute: 0 10*3/uL (ref 0.0–0.7)
HCT: 35.4 % — ABNORMAL LOW (ref 36.0–46.0)
Hemoglobin: 11.5 g/dL — ABNORMAL LOW (ref 12.0–15.0)
LYMPHS PCT: 8 %
Lymphs Abs: 1.1 10*3/uL (ref 0.7–4.0)
MCH: 28.6 pg (ref 26.0–34.0)
MCHC: 32.5 g/dL (ref 30.0–36.0)
MCV: 88.1 fL (ref 78.0–100.0)
Monocytes Absolute: 1.3 10*3/uL — ABNORMAL HIGH (ref 0.1–1.0)
Monocytes Relative: 9 %
Neutro Abs: 10.9 10*3/uL — ABNORMAL HIGH (ref 1.7–7.7)
Neutrophils Relative %: 83 %
PLATELETS: 350 10*3/uL (ref 150–400)
RBC: 4.02 MIL/uL (ref 3.87–5.11)
RDW: 14.1 % (ref 11.5–15.5)
WBC: 13.3 10*3/uL — AB (ref 4.0–10.5)

## 2015-04-03 LAB — LIPASE, BLOOD: LIPASE: 25 U/L (ref 11–51)

## 2015-04-03 LAB — C-REACTIVE PROTEIN: CRP: 3.5 mg/dL — AB (ref <1.0)

## 2015-04-03 LAB — PROTIME-INR
INR: 2.07 — ABNORMAL HIGH (ref 0.00–1.49)
PROTHROMBIN TIME: 23.1 s — AB (ref 11.6–15.2)

## 2015-04-03 MED ORDER — WARFARIN - PHARMACIST DOSING INPATIENT: Freq: Every day | Status: DC

## 2015-04-03 MED ORDER — ONDANSETRON HCL 4 MG/2ML IJ SOLN
4.0000 mg | Freq: Once | INTRAMUSCULAR | Status: AC
  Administered 2015-04-03: 4 mg via INTRAVENOUS
  Filled 2015-04-03: qty 2

## 2015-04-03 MED ORDER — ONDANSETRON HCL 4 MG PO TABS: 4.0000 mg | ORAL_TABLET | Freq: Four times a day (QID) | ORAL | Status: DC | PRN

## 2015-04-03 MED ORDER — PIPERACILLIN-TAZOBACTAM 3.375 G IVPB 30 MIN
3.3750 g | Freq: Once | INTRAVENOUS | Status: AC
  Administered 2015-04-03: 3.375 g via INTRAVENOUS
  Filled 2015-04-03: qty 50

## 2015-04-03 MED ORDER — PIPERACILLIN-TAZOBACTAM 3.375 G IVPB
3.3750 g | Freq: Three times a day (TID) | INTRAVENOUS | Status: DC
  Administered 2015-04-03 – 2015-04-04 (×2): 3.375 g via INTRAVENOUS
  Filled 2015-04-03 (×4): qty 50

## 2015-04-03 MED ORDER — VERAPAMIL HCL ER 180 MG PO TBCR
180.0000 mg | EXTENDED_RELEASE_TABLET | Freq: Every day | ORAL | Status: DC
  Administered 2015-04-03 – 2015-04-10 (×8): 180 mg via ORAL
  Filled 2015-04-03 (×8): qty 1

## 2015-04-03 MED ORDER — INSULIN ASPART 100 UNIT/ML ~~LOC~~ SOLN: 0.0000 [IU] | Freq: Three times a day (TID) | SUBCUTANEOUS | Status: DC

## 2015-04-03 MED ORDER — SODIUM CHLORIDE 0.9% FLUSH
3.0000 mL | Freq: Two times a day (BID) | INTRAVENOUS | Status: DC
  Administered 2015-04-05 – 2015-04-10 (×5): 3 mL via INTRAVENOUS

## 2015-04-03 MED ORDER — SODIUM CHLORIDE 0.9 % IV BOLUS (SEPSIS)
500.0000 mL | Freq: Once | INTRAVENOUS | Status: AC
  Administered 2015-04-03: 500 mL via INTRAVENOUS

## 2015-04-03 MED ORDER — POTASSIUM CHLORIDE CRYS ER 20 MEQ PO TBCR
40.0000 meq | EXTENDED_RELEASE_TABLET | Freq: Once | ORAL | Status: AC
  Administered 2015-04-03: 40 meq via ORAL
  Filled 2015-04-03: qty 2

## 2015-04-03 MED ORDER — ACETAMINOPHEN 325 MG PO TABS
650.0000 mg | ORAL_TABLET | Freq: Four times a day (QID) | ORAL | Status: DC
  Administered 2015-04-07: 650 mg via ORAL
  Filled 2015-04-03 (×6): qty 2

## 2015-04-03 MED ORDER — POTASSIUM CHLORIDE CRYS ER 20 MEQ PO TBCR: 40.0000 meq | EXTENDED_RELEASE_TABLET | Freq: Every day | ORAL | Status: DC

## 2015-04-03 MED ORDER — ACETAMINOPHEN 325 MG PO TABS: 650.0000 mg | ORAL_TABLET | Freq: Four times a day (QID) | ORAL | Status: DC

## 2015-04-03 MED ORDER — MORPHINE SULFATE (PF) 2 MG/ML IV SOLN
2.0000 mg | INTRAVENOUS | Status: DC | PRN
  Administered 2015-04-03 – 2015-04-05 (×3): 2 mg via INTRAVENOUS
  Filled 2015-04-03 (×3): qty 1

## 2015-04-03 MED ORDER — WARFARIN SODIUM 2.5 MG PO TABS
2.5000 mg | ORAL_TABLET | ORAL | Status: AC
  Administered 2015-04-03: 2.5 mg via ORAL
  Filled 2015-04-03: qty 1

## 2015-04-03 MED ORDER — HYDROCORTISONE ACE-PRAMOXINE 2.5-1 % RE CREA: 1.0000 "application " | TOPICAL_CREAM | Freq: Three times a day (TID) | RECTAL | Status: DC

## 2015-04-03 MED ORDER — MORPHINE SULFATE (PF) 4 MG/ML IV SOLN
4.0000 mg | Freq: Once | INTRAVENOUS | Status: AC
  Administered 2015-04-03: 4 mg via INTRAVENOUS
  Filled 2015-04-03: qty 1

## 2015-04-03 MED ORDER — SODIUM CHLORIDE 0.9 % IV SOLN
INTRAVENOUS | Status: DC
  Administered 2015-04-03 – 2015-04-06 (×4): via INTRAVENOUS

## 2015-04-03 MED ORDER — ONDANSETRON HCL 4 MG/2ML IJ SOLN: 4.0000 mg | Freq: Four times a day (QID) | INTRAMUSCULAR | Status: DC | PRN

## 2015-04-03 MED ORDER — BARIUM SULFATE 2.1 % PO SUSP
ORAL | Status: AC
  Filled 2015-04-03: qty 2

## 2015-04-03 MED ORDER — DIGOXIN 62.5 MCG PO TABS: 0.0625 mg | ORAL_TABLET | Freq: Every day | ORAL | Status: DC

## 2015-04-03 MED ORDER — TORSEMIDE 20 MG PO TABS: 60.0000 mg | ORAL_TABLET | Freq: Every day | ORAL | Status: DC

## 2015-04-03 MED ORDER — WARFARIN SODIUM 5 MG PO TABS: 5.0000 mg | ORAL_TABLET | Freq: Once | ORAL | Status: DC

## 2015-04-03 MED ORDER — DIGOXIN 125 MCG PO TABS
0.0625 mg | ORAL_TABLET | Freq: Every day | ORAL | Status: DC
  Administered 2015-04-04 – 2015-04-11 (×8): 0.0625 mg via ORAL
  Filled 2015-04-03 (×8): qty 1

## 2015-04-03 NOTE — Progress Notes (Addendum)
By signing my name below, I, [name], attest that this documentation has been prepared under the direction and in the presence of [name], MD. Electronically Signed: [name], [place]. 04/03/2015 , 12:52 PM .  Patient was seen in room 4 .  Chief Complaint:  Chief Complaint  Patient presents with  . Diverticulitis  . Chills    HPI: [name] is a 82 y.o. female who reports to [facility] today complaining of abd pain with chills she was hospitalized in February with acute diverticulitis. She was found to have diverticulitis without  Perforation in mid February.   She saw Dr. Watt Climes a week ago and was given amoxicillin. She doesn't feel any better and had a fever tmax 101.8. She reports the pain hasn't improved at all.   Past Medical History  Diagnosis Date  . Pericardial effusion     a. s/p pericardial window 01/16/15  . Atrial fibrillation (HCC)     Chronic  . Chronic anticoagulation     on Coumadin  . History of GI bleed     once treated with Fe infusion  . HTN (hypertension)   . Pacemaker     a. Medtronic, placed for tachybrady.   . Obesity   . LVH (left ventricular hypertrophy)   . Sleep apnea     mild-no cpap  . Positive TB test   . Anemia   . Stroke [facility])     a. 2013: right frontal  . Arthritis   . Tachycardia-bradycardia syndrome [facility])     a. s/p Medtronic Sensia  model number [id number], serial number [id number] H 12/2013  . History of shingles   . Chronic diastolic (congestive) heart failure ([place])   . Chronic atrial fibrillation (HCC)     a. on coumadin, digoxin and calan  . GI bleed     a. 2/2 AVMs   Past Surgical History  Procedure Laterality Date  . Breast lumpectomy Bilateral     negative for cancer  . Esophagogastroduodenoscopy  05/19/2011    Procedure: ESOPHAGOGASTRODUODENOSCOPY (EGD);  Surgeon: Lear Ng, MD;  Location: [facility] ENDOSCOPY;  Service: Endoscopy;  Laterality: N/A;  doctor aware of inr   will try to be here no later than  230  . Cholecystectomy  2005  . Esophagogastroduodenoscopy  06/22/2011    Procedure: ESOPHAGOGASTRODUODENOSCOPY (EGD);  Surgeon: Arta Silence, MD;  Location: [facility] ENDOSCOPY;  Service: Endoscopy;  Laterality: N/A;  Check PT/INR in am  . Givens capsule study  06/23/2011    Procedure: GIVENS CAPSULE STUDY;  Surgeon: Arta Silence, MD;  Location: [facility] ENDOSCOPY;  Service: Endoscopy;  Laterality: N/A;  . Colonoscopy  08/11/2011    Procedure: COLONOSCOPY;  Surgeon: Jeryl Columbia, MD;  Location: [facility] ENDOSCOPY;  Service: Endoscopy;  Laterality: N/A;  . Hot hemostasis  08/11/2011    Procedure: HOT HEMOSTASIS (ARGON PLASMA COAGULATION/BICAP);  Surgeon: Jeryl Columbia, MD;  Location: [facility] ENDOSCOPY;  Service: Endoscopy;  Laterality: N/A;  . Mass excision Left 09/14/2013    Procedure: EXCISION MASS LEFT WRIST;  Surgeon: Leanora Cover, MD;  Location: [place];  Service: Orthopedics;  Laterality: Left;  . Tonsillectomy and adenoidectomy  1950  . Insert / replace / remove pacemaker  2001    Last generator in 2006; interrogated Dec 2012  . Cataract extraction w/ intraocular lens  implant, bilateral Bilateral   . Lipoma excision Left 07/2013    wrist  . Pacemaker generator change N/A 01/02/2014    Procedure: PACEMAKER  GENERATOR CHANGE;  Surgeon: Sanda Klein, MD;  Location: [facility] CATH LAB;  Service: Cardiovascular;  Laterality: N/A;  . Lead revision N/A 01/02/2014    Procedure: LEAD REVISION;  Surgeon: Sanda Klein, MD;  Location: [place] CATH LAB;  Service: Cardiovascular;  Laterality: N/A;  . Subxyphoid pericardial window N/A 01/16/2015    Procedure: SUBXYPHOID PERICARDIAL WINDOW;  Surgeon: Grace Isaac, MD;  Location: [place];  Service: Thoracic;  Laterality: N/A;  . Tee without cardioversion N/A 01/16/2015    Procedure: TRANSESOPHAGEAL ECHOCARDIOGRAM (TEE);  Surgeon: Grace Isaac, MD;  Location: [place];  Service: Thoracic;  Laterality: N/A;   Social History   Social History  . Marital Status:  Widowed    Spouse Name: N/A  . Number of Children: N/A  . Years of Education: N/A   Social History Main Topics  . Smoking status: Never Smoker   . Smokeless tobacco: Never Used  . Alcohol Use: 0.0 oz/week    0 Standard drinks or equivalent per week     Comment: 01/02/2014 "I'll have a drink a few times/year"  . Drug Use: No  . Sexual Activity: No   Other Topics Concern  . None   Social History Narrative   Family History  Problem Relation Age of Onset  . Stroke Mother   . Stroke Father   . Pneumonia Father    Allergies  Allergen Reactions  . Iodinated Diagnostic Agents Hives    HIVES 15MIN S/P IV CONTRAST INJECTION,WILL NEED 13 HR PREP FOR FUTURE INJECTIONS, ok s/p 50mg  po benadryl//a.calhoun  . Anti-Inflammatory Enzyme [Nutritional Supplements]     Retains fluids and headaches  . Arthrotec [Diclofenac-Misoprostol] Other (See Comments)    unknown  . Biaxin [Clarithromycin] Other (See Comments)    unknown  . Plavix [Clopidogrel Bisulfate] Other (See Comments)    unknown  . Spironolactone     Hair loss   Prior to Admission medications   Medication Sig Start Date End Date Taking? Authorizing Provider  ciprofloxacin (CIPRO) 500 MG tablet Take 1 tablet (500 mg total) by mouth 2 (two) times daily. 03/09/15   Andale N Rumley, DO  Digoxin 62.5 MCG TABS Take 0.0625 mg by mouth daily. Take only 5 days a week; skip Wednesdays and Sundays 03/20/15   Sanda Klein, MD  metolazone (ZAROXOLYN) 2.5 MG tablet TAKE 1 TABLET 30 MINUTES PRIOR TO TORSEMIDE DOSE ON Wednesday AND Saturday. 02/08/15   Rhonda G Barrett, PA-C  ondansetron (ZOFRAN ODT) 4 MG disintegrating tablet Take 1 tablet (4 mg total) by mouth every 8 (eight) hours as needed for nausea or vomiting. 03/09/15   [place] N Rumley, DO  polyethylene glycol (MIRALAX / GLYCOLAX) packet Take 17 g by mouth daily.    Historical Provider, MD  potassium chloride (K-DUR,KLOR-CON) 10 MEQ tablet Take 20 mEq by mouth daily. Take with Torsemide     Historical Provider, MD  torsemide (DEMADEX) 20 MG tablet Take 3 tablets (60 mg total) by mouth daily. 01/26/15   Eileen Stanford, PA-C  verapamil (CALAN-SR) 180 MG CR tablet take 1 tablet by mouth at bedtime 03/26/15   Sanda Klein, MD  warfarin (COUMADIN) 5 MG tablet Take 1 tablet by mouth daily or as directed by coumadin clinic Patient taking differently: 2.5-5 mg. Monday Wednesday Friday Saturday (2.5 mg ) 5 mg all other days 01/30/15   Dani Gobble Croitoru, MD     ROS:  Constitutional: negative for fever, night sweats, weight changes, or fatigue; positive for chills HEENT: negative for vision  changes, hearing loss, congestion, rhinorrhea, ST, epistaxis, or sinus pressure Cardiovascular: negative for chest pain or palpitations Respiratory: negative for hemoptysis, wheezing, shortness of breath, or cough Abdominal: negative for nausea, vomiting, diarrhea, or constipation; positive for abdominal pain Dermatological: negative for rash Neurologic: negative for headache, dizziness, or syncope All other systems reviewed and are otherwise negative with the exception to those above and in the HPI.  PHYSICAL EXAM: Filed Vitals:   04/03/15 1222  BP: 116/60  Pulse: 82  Temp: 98.3 F (36.8 C)  Resp: 16   Body mass index is 24.26 kg/(m^2).   General: Alert, no acute distress HEENT:  Normocephalic, atraumatic, oropharynx patent. Eye: Juliette Mangle [facility] Cardiovascular:  Heart irregular rhythm, grade 2 systolic murmur, no rubs or gallops.  No Carotid bruits, radial pulse intact. No pedal edema.  Respiratory: Clear to auscultation bilaterally.  No wheezes, rales, or rhonchi.  No cyanosis, no use of accessory musculature Abdominal: No organomegaly, positive bowel sounds. significant LLQ abdominal pain with guarding with suspected mass, non distended Musculoskeletal: Gait intact. No edema, tenderness Skin: No rashes. Neurologic: Facial musculature symmetric. Psychiatric: Patient acts appropriately  throughout our interaction.  Lymphatic: No cervical or submandibular lymphadenopathy Genitourinary/Anorectal: No acute findings  LABS: Results for orders placed or performed in visit on 04/03/15  POCT CBC  Result Value Ref Range   WBC 13.3 (A) 4.6 - 10.2 K/uL   Lymph, poc 1.8 0.6 - 3.4   POC LYMPH PERCENT 13.7 10 - 50 %L   MID (cbc) 0.3 0 - 0.9   POC MID % 2.5 0 - 12 %M   POC Granulocyte 11.1 (A) 2 - 6.9   Granulocyte percent 83.8 (A) 37 - 80 %G   RBC 4.03 (A) 4.04 - 5.48 M/uL   Hemoglobin 11.8 (A) 12.2 - 16.2 g/dL   HCT, POC 34.1 (A) 37.7 - 47.9 %   MCV 84.6 80 - 97 fL   MCH, POC 29.3 27 - 31.2 pg   MCHC 34.6 31.8 - 35.4 g/dL   RDW, POC 13.5 %   Platelet Count, POC 339 142 - 424 K/uL   MPV 7.6 0 - 99.8 fL    EKG/XRAY:     ASSESSMENT/PLAN:   white count elevated at 13 3. She has severe left lower quadrant abdominal pain.  Concerns whether she has perforated or possibly perforated with abscess. Treatment complicated because patient is on Coumadin. She did respond in the hospital to Cipro Flagyl before. She saw gastroenterologist and is currently on amoxicillin and worsening. Patient sent back to the emergency room for probable rescanning. Family medicine service has been called and they are ware of the patient is coming back to the emergency  room and they  are available for admission if needed.I personally performed the services described in this documentation, which was scribed in my presence. The recorded information has been reviewed and is accurate.  Gross sideeffects, risk and benefits, and alternatives of medications d/w patient. Patient is aware that all medications have potential sideeffects and we are unable to predict every sideeffect or drug-drug interaction that may occur.  [name] MD 04/03/2015 12:52 PM

## 2015-04-03 NOTE — H&P (Signed)
[facility] Admission History and Physical Service Pager: [phone]  Patient name: [name] Medical record number: [id number] Date of birth: [birth year] Age: 82 y.o. Gender: female  Primary Care Provider: [name], MD Consultants: None Code Status: Full  Chief Complaint: Abdominal pain with fevers  Assessment and Plan: [name] is a 82 y.o. female presenting with abdominal pain. PMH is significant for atrial fibrillation, hx of CVA, hypertension, type 2 diabetes, tachycardia-bradycardia syndrome s/p pacemaker, prior pericardial effusion s/p pericardial window 12/16, sleep apnea (no CPAP), stroke (right frontal 2013), GI bleeding with hemorrhoids, and arthritis. Pain is likely due to recurrent vs. unresolved diverticulitis.  #Left Lower Quadrant Abdominal pain: Worsening left lower quadrant pain with fevers and poor PO. Concern was for perforated or abscess from ddiverticuilits which patient has been treated for over the last month. Per PCP note patient was seen by GI (Dr. Watt Climes) as outpatient. Patient with hx of AVMs cauterized in 2013 during colonoscopy, also hx of hemorrhoids. Stable vitals. Slight leukocytosis to 13.3. CT with evidence of left colonic diverticulitis with pericolonic edema, consistent with acute inflammation but unchanged from last month's imaging. Lower quadrant pain differential includes acute cystitis versus acute colitis versus diverticulitis. Exam concerning for guarding and distension but CT abdomen reassuring against surgical abdomen. Furthermore, patient has been tolerating food but in decreased amounts. - Admit to inpatient observation, Dr. Ardelia Mems  - Vitals per floor  - Continue IV zosyn, would hold off on quick transition to oral antibiotics since patient has failed 2 different oral regimens as outpatient  - Will get CRP for baseline and to monitor for improvement  - Urine culture pending - Blood culture pending -  Obtain AM CBC and BMET - Bowel rest tonight and then advance diet to clears  - trend fevers - Strict I's and O's - IVFs while NPO - Scheduled tylenol and prn morphine 2 mg q4h for pain - consider surgical consult if not improved; may need resection. Also can consider GI follow-up since was seen as outpatient.  #Anemia: hgb 11.5 (baseline 11.5). Long-term history of rectal bleeding. With history of hemorrhoids. - Monitor with am CBCs - FOBT negative -monitor for GI bleed  # Atrial fibrillation: ECHO EF 0000000, no diastolic dysfunction. - Continue Verapamil and Digoxin  - Continue Coumadin per pharmacy (FOBT negative and hgb at baseline) - INR 2.07 on admission  # Type 2 diabetes mellitus: A1C 6.2  - SSI - Continue ACQHS  #Lower extremity edema: Trace. Patient says she usually wears compression hose. Documented history of CHF but ECHO in January 2017 w/ normal EF and no diastolic dysfunction, some moderate valvular disease. Has stasis dermatitis of lower legs.  - Hold home Torsemide and metolazone while providing IVF; will likely restart in AM - [place] I/Os - Daily weights  FEN/GI: NS @100  mL/hr while NPO Prophylaxis: SCDs (on warfarin)   Disposition: Observation on telemetry, home pending clinical improvement  History of Present Illness:  [name] is a 82 y.o. female presenting with 2 days of worsening abdominal pain with fevers. Of note, she was admitted med-February for abdominal pain found to be diverticulitis. She says she feels tender when pressing on her LLQ, like she has in abscess there. The pain is sharp. It comes and goes. She presented to urgent care because of this worsening pain and concern about her fever. She also complains of more generalized pain across her lower abdomen. She was last discharged on PO antibiotics,  metronidazole and ciprofloxacin for 7 days. She had started feeling better 4 days after she was discharged from the last hospitalization. Her  abdominal pain would improve then come back, so she saw her PCP for continued pain. Of note, patient followed up with [facility](dr. Magod) about a week ago. He prescribed a week of amoxicillin, which she says she finished today. She began having fevers yesterday, 04/02/15, to as high as 101.8 and that she has felt cold. She denies emesis but has no appetite and has to force herself to eat and drink. She also has had smaller, more frequent, softer stools since being on antibiotics. She does report that she has had abdominal pain for months. Also, in the past she says she was told her intestine telescoped into her rectum, though this cannot be found in available imaging. She has a history of external hemorrhoids and reports seeing bright red blood 2-3 days ago in the toilet and with wiping. She denies dizziness, weakness, and dyspnea.  In the ED, CT of abdomen was negative for abscess or perforation but did show diverticulitis, possibly recurrent or unresolved from prior. Temperature was elevated to 101.3 F. Lipase WNL at 25. Leukocytosis to 13.3. UA with small hgb. She was given IV morphine 4 mg for pain.  Review Of Systems: Per HPI with the following additions: more chronically struggles with constipation Otherwise the remainder of the systems were negative.  Patient Active Problem List   Diagnosis Date Noted  . Diverticulitis 03/08/2015  . Obesity   . Chronic atrial fibrillation ([place])   . Upper airway cough syndrome   . Dyspnea 05/22/2014  . Tachycardia-bradycardia syndrome with symptomatic bradycardia 01/03/2014  . History of CVA (cerebrovascular accident) 10/25/2013  . Diabetes ([place]) 08/03/2013  . Myofacial muscle pain 10/16/2011  . Back pain, Left Flank 07/01/2011  . Cerebral embolism with cerebral infarction ([place]) 06/12/2011  . CVA (cerebral infarction) 05/20/2011  . Weakness of left side of body 05/16/2011  . Elevated digoxin level 05/16/2011  . Pacemaker 05/21/2010  . Edema 04/24/2010  .  Pericardial effusion, Large   . Chronic anticoagulation - Coumadin, CHADS2VASC=7   . HTN (hypertension)   . Atrial fibrillation ([place]) 04/14/2010    Past Medical History: Past Medical History  Diagnosis Date  . Pericardial effusion     a. s/p pericardial window 01/16/15  . Atrial fibrillation (H[facility])     Chronic  . Chronic anticoagulation     on Coumadin  . History of GI bleed     once treated with Fe infusion  . HTN (hypertension)   . Pacemaker     a. Medtronic, placed for tachybrady.   . Obesity   . LVH (left ventricular hypertrophy)   . Sleep apnea     mild-no cpap  . Positive TB test   . Anemia   . Stroke [facility])     a. 2013: right frontal  . Arthritis   . Tachycardia-bradycardia syndrome [facility])     a. s/p Medtronic Sensia  model number [id number], serial number [id number] H 12/2013  . History of shingles   . Chronic diastolic (congestive) heart failure ([place])   . Chronic atrial fibrillation (HCC)     a. on coumadin, digoxin and calan  . GI bleed     a. 2/2 AVMs    Past Surgical History: Past Surgical History  Procedure Laterality Date  . Breast lumpectomy Bilateral     negative for cancer  . Esophagogastroduodenoscopy  05/19/2011    Procedure: ESOPHAGOGASTRODUODENOSCOPY (  EGD);  Surgeon: Lear Ng, MD;  Location: [facility] ENDOSCOPY;  Service: Endoscopy;  Laterality: N/A;  doctor aware of inr   will try to be here no later than 230  . Cholecystectomy  2005  . Esophagogastroduodenoscopy  06/22/2011    Procedure: ESOPHAGOGASTRODUODENOSCOPY (EGD);  Surgeon: Arta Silence, MD;  Location: [facility] ENDOSCOPY;  Service: Endoscopy;  Laterality: N/A;  Check PT/INR in am  . Givens capsule study  06/23/2011    Procedure: GIVENS CAPSULE STUDY;  Surgeon: Arta Silence, MD;  Location: [facility] ENDOSCOPY;  Service: Endoscopy;  Laterality: N/A;  . Colonoscopy  08/11/2011    Procedure: COLONOSCOPY;  Surgeon: Jeryl Columbia, MD;  Location: [facility] ENDOSCOPY;  Service: Endoscopy;  Laterality: N/A;  . Hot  hemostasis  08/11/2011    Procedure: HOT HEMOSTASIS (ARGON PLASMA COAGULATION/BICAP);  Surgeon: Jeryl Columbia, MD;  Location: [facility] ENDOSCOPY;  Service: Endoscopy;  Laterality: N/A;  . Mass excision Left 09/14/2013    Procedure: EXCISION MASS LEFT WRIST;  Surgeon: Leanora Cover, MD;  Location: [place];  Service: Orthopedics;  Laterality: Left;  . Tonsillectomy and adenoidectomy  1950  . Insert / replace / remove pacemaker  2001    Last generator in 2006; interrogated Dec 2012  . Cataract extraction w/ intraocular lens  implant, bilateral Bilateral   . Lipoma excision Left 07/2013    wrist  . Pacemaker generator change N/A 01/02/2014    Procedure: PACEMAKER GENERATOR CHANGE;  Surgeon: Sanda Klein, MD;  Location: [place] CATH LAB;  Service: Cardiovascular;  Laterality: N/A;  . Lead revision N/A 01/02/2014    Procedure: LEAD REVISION;  Surgeon: Sanda Klein, MD;  Location: [place] CATH LAB;  Service: Cardiovascular;  Laterality: N/A;  . Subxyphoid pericardial window N/A 01/16/2015    Procedure: SUBXYPHOID PERICARDIAL WINDOW;  Surgeon: Grace Isaac, MD;  Location: [place];  Service: Thoracic;  Laterality: N/A;  . Tee without cardioversion N/A 01/16/2015    Procedure: TRANSESOPHAGEAL ECHOCARDIOGRAM (TEE);  Surgeon: Grace Isaac, MD;  [facility];  Service: Thoracic;  Laterality: N/A;    Social History: Social History  Substance Use Topics  . Smoking status: Never Smoker   . Smokeless tobacco: Never Used  . Alcohol Use: 0.0 oz/week    0 Standard drinks or equivalent per week     Comment: 01/02/2014 "I'll have a drink a few times/year"   Additional social history: Lives alone Please also refer to relevant sections of EMR.  Family History: Family History  Problem Relation Age of Onset  . Stroke Mother   . Stroke Father   . Pneumonia Father     Allergies and Medications: Allergies  Allergen Reactions  . Iodinated Diagnostic Agents Hives    HIVES 15MIN S/P IV CONTRAST  INJECTION,WILL NEED 13 HR PREP FOR FUTURE INJECTIONS, ok s/p 50mg  po benadryl//a.calhoun  . Anti-Inflammatory Enzyme [Nutritional Supplements]     Retains fluids and headaches  . Arthrotec [Diclofenac-Misoprostol] Other (See Comments)    unknown  . Biaxin [Clarithromycin] Other (See Comments)    unknown  . Plavix [Clopidogrel Bisulfate] Other (See Comments)    unknown  . Spironolactone     Hair loss   No current facility-administered medications on file prior to encounter.   Current Outpatient Prescriptions on File Prior to Encounter  Medication Sig Dispense Refill  . Digoxin 62.5 MCG TABS Take 0.0625 mg by mouth daily. Take only 5 days a week; skip Wednesdays and Sundays 30 tablet 6  . polyethylene glycol (MIRALAX /  GLYCOLAX) packet Take 17 g by mouth daily.    . potassium chloride (K-DUR,KLOR-CON) 10 MEQ tablet Take 20 mEq by mouth daily. Take with Torsemide    . torsemide (DEMADEX) 20 MG tablet Take 3 tablets (60 mg total) by mouth daily. 180 tablet 6  . verapamil (CALAN-SR) 180 MG CR tablet take 1 tablet by mouth at bedtime 30 tablet 11  . warfarin (COUMADIN) 5 MG tablet Take 1 tablet by mouth daily or as directed by coumadin clinic (Patient taking differently: 2.5-5 mg. Monday Wednesday Friday Saturday (2.5 mg ) 5 mg all other days) 90 tablet 1  . metolazone (ZAROXOLYN) 2.5 MG tablet TAKE 1 TABLET 30 MINUTES PRIOR TO TORSEMIDE DOSE ON Wednesday AND Saturday. (Patient not taking: Reported on 04/03/2015) 30 tablet 3  . ondansetron (ZOFRAN ODT) 4 MG disintegrating tablet Take 1 tablet (4 mg total) by mouth every 8 (eight) hours as needed for nausea or vomiting. (Patient not taking: Reported on 04/03/2015) 20 tablet 0    Objective: BP 109/56 mmHg  Pulse 62  Temp(Src) 100.1 F (37.8 C) (Rectal)  Resp 17  SpO2 96% Exam: General: Well-appearing female, resting in bed, NAD Eyes: PERRL, EOMI ENTM: No nasal congestion. MM slightly tacky.  Cardiovascular: RRR, S1, S2, murmur appreciated,  good DP pulses and brink cap refil Chest: Lungs CTAB, no crackles. No increased WOB. Abdomen: ++BS, diffusely TTP with guarding of lower abdomen -- especially LLQ, hyper-resonant, slightly distended MSK: Good tone, trace pitting edema to mid-shins, seen ambulating to bathroom without need for assistance GU: External hemorrhoids with no frank blood on glove with rectal exam   Skin: WWP, no rashes, lower extremities with color changes 2/2 venous stasis Neuro: AOx3, no focal deficits Psych: Appropriate mood and affect  Labs and Imaging: Results for orders placed or performed during the hospital encounter of 04/03/15 (from the past 24 hour(s))  CBC with Differential     Status: Abnormal   Collection Time: 04/03/15  2:35 PM  Result Value Ref Range   WBC 13.3 (H) 4.0 - 10.5 K/uL   RBC 4.02 3.87 - 5.11 MIL/uL   Hemoglobin 11.5 (L) 12.0 - 15.0 g/dL   HCT 35.4 (L) 36.0 - 46.0 %   MCV 88.1 78.0 - 100.0 fL   MCH 28.6 26.0 - 34.0 pg   MCHC 32.5 30.0 - 36.0 g/dL   RDW 14.1 11.5 - 15.5 %   Platelets 350 150 - 400 K/uL   Neutrophils Relative % 83 %   Neutro Abs 10.9 (H) 1.7 - 7.7 K/uL   Lymphocytes Relative 8 %   Lymphs Abs 1.1 0.7 - 4.0 K/uL   Monocytes Relative 9 %   Monocytes Absolute 1.3 (H) 0.1 - 1.0 K/uL   Eosinophils Relative 0 %   Eosinophils Absolute 0.0 0.0 - 0.7 K/uL   Basophils Relative 0 %   Basophils Absolute 0.0 0.0 - 0.1 K/uL  Comprehensive metabolic panel     Status: Abnormal   Collection Time: 04/03/15  2:35 PM  Result Value Ref Range   Sodium 140 135 - 145 mmol/L   Potassium 3.1 (L) 3.5 - 5.1 mmol/L   Chloride 98 (L) 101 - 111 mmol/L   CO2 31 22 - 32 mmol/L   Glucose, Bld 95 65 - 99 mg/dL   BUN 13 6 - 20 mg/dL   Creatinine, Ser 1.11 (H) 0.44 - 1.00 mg/dL   Calcium 9.1 8.9 - 10.3 mg/dL   Total Protein 6.8 6.5 - 8.1 g/dL  Albumin 3.2 (L) 3.5 - 5.0 g/dL   AST 34 15 - 41 U/L   ALT 24 14 - 54 U/L   Alkaline Phosphatase 69 38 - 126 U/L   Total Bilirubin 0.7 0.3 - 1.2  mg/dL   GFR calc non Af Amer 42 (L) >60 mL/min   GFR calc Af Amer 49 (L) >60 mL/min   Anion gap 11 5 - 15  Lipase, blood     Status: None   Collection Time: 04/03/15  2:35 PM  Result Value Ref Range   Lipase 25 11 - 51 U/L  Protime-INR     Status: Abnormal   Collection Time: 04/03/15  2:35 PM  Result Value Ref Range   Prothrombin Time 23.1 (H) 11.6 - 15.2 seconds   INR 2.07 (H) 0.00 - 1.49  Urinalysis, Routine w reflex microscopic (not at [facility])     Status: Abnormal   Collection Time: 04/03/15  2:45 PM  Result Value Ref Range   Color, Urine YELLOW YELLOW   APPearance CLEAR CLEAR   Specific Gravity, Urine 1.009 1.005 - 1.030   pH 7.5 5.0 - 8.0   Glucose, UA NEGATIVE NEGATIVE mg/dL   Hgb urine dipstick SMALL (A) NEGATIVE   Bilirubin Urine NEGATIVE NEGATIVE   Ketones, ur NEGATIVE NEGATIVE mg/dL   Protein, ur NEGATIVE NEGATIVE mg/dL   Nitrite NEGATIVE NEGATIVE   Leukocytes, UA NEGATIVE NEGATIVE  Urine microscopic-add on     Status: Abnormal   Collection Time: 04/03/15  2:45 PM  Result Value Ref Range   Squamous Epithelial / LPF 0-5 (A) NONE SEEN   WBC, UA 0-5 0 - 5 WBC/hpf   RBC / HPF 0-5 0 - 5 RBC/hpf   Bacteria, UA RARE (A) NONE SEEN   Casts HYALINE CASTS (A) NEGATIVE   Ct Abdomen Pelvis Wo Contrast  04/03/2015  IMPRESSION: 1. No substantial interval change in the pericolonic edema/inflammation associated with the distal descending and proximal sigmoid colon. Imaging features are compatible with acute diverticulitis although the lack of interval change may be related to persistent or recurrent disease. No evidence for perforation or abscess. Given the persistence, underlying neoplastic involvement should be considered. 2. Bilateral renal cysts. High attenuation lesions in the right kidney may be related to proteinaceous or hemorrhagic cyst. Electronically Signed   By: Misty Stanley M.D.   On: 04/03/2015 17:54   Olene Floss, MD PGY-1, [place]  Upper-Level Resident Addendum  I have independently interviewed and examined the patient. I have discussed the above with the original author and agree with their documentation. My edits for correction/addition/clarification are in pink. Please see also any attending notes.   Katheren Shams, DO PGY-2, [place] [facility] pager: [phone] (text pages welcome through [facility])

## 2015-04-03 NOTE — ED Provider Notes (Signed)
CT shows essentially unchanged diverticulitis. No abscess or perforation. Patient tells me she recently finished amoxicillin (after already having antibiotics 1 month ago) and now symptoms are worse over past 2 days with fever up to 101. HDS. Admit to family practice for IV antibiotics. They request tele admission given history of chronic afib on digoxin.  Results for orders placed or performed during the hospital encounter of 04/03/15  CBC with Differential  Result Value Ref Range   WBC 13.3 (H) 4.0 - 10.5 K/uL   RBC 4.02 3.87 - 5.11 MIL/uL   Hemoglobin 11.5 (L) 12.0 - 15.0 g/dL   HCT 35.4 (L) 36.0 - 46.0 %   MCV 88.1 78.0 - 100.0 fL   MCH 28.6 26.0 - 34.0 pg   MCHC 32.5 30.0 - 36.0 g/dL   RDW 14.1 11.5 - 15.5 %   Platelets 350 150 - 400 K/uL   Neutrophils Relative % 83 %   Neutro Abs 10.9 (H) 1.7 - 7.7 K/uL   Lymphocytes Relative 8 %   Lymphs Abs 1.1 0.7 - 4.0 K/uL   Monocytes Relative 9 %   Monocytes Absolute 1.3 (H) 0.1 - 1.0 K/uL   Eosinophils Relative 0 %   Eosinophils Absolute 0.0 0.0 - 0.7 K/uL   Basophils Relative 0 %   Basophils Absolute 0.0 0.0 - 0.1 K/uL  Comprehensive metabolic panel  Result Value Ref Range   Sodium 140 135 - 145 mmol/L   Potassium 3.1 (L) 3.5 - 5.1 mmol/L   Chloride 98 (L) 101 - 111 mmol/L   CO2 31 22 - 32 mmol/L   Glucose, Bld 95 65 - 99 mg/dL   BUN 13 6 - 20 mg/dL   Creatinine, Ser 1.11 (H) 0.44 - 1.00 mg/dL   Calcium 9.1 8.9 - 10.3 mg/dL   Total Protein 6.8 6.5 - 8.1 g/dL   Albumin 3.2 (L) 3.5 - 5.0 g/dL   AST 34 15 - 41 U/L   ALT 24 14 - 54 U/L   Alkaline Phosphatase 69 38 - 126 U/L   Total Bilirubin 0.7 0.3 - 1.2 mg/dL   GFR calc non Af Amer 42 (L) >60 mL/min   GFR calc Af Amer 49 (L) >60 mL/min   Anion gap 11 5 - 15  Lipase, blood  Result Value Ref Range   Lipase 25 11 - 51 U/L  Urinalysis, Routine w reflex microscopic (not at [facility])  Result Value Ref Range   Color, Urine YELLOW YELLOW   APPearance CLEAR CLEAR   Specific Gravity,  Urine 1.009 1.005 - 1.030   pH 7.5 5.0 - 8.0   Glucose, UA NEGATIVE NEGATIVE mg/dL   Hgb urine dipstick SMALL (A) NEGATIVE   Bilirubin Urine NEGATIVE NEGATIVE   Ketones, ur NEGATIVE NEGATIVE mg/dL   Protein, ur NEGATIVE NEGATIVE mg/dL   Nitrite NEGATIVE NEGATIVE   Leukocytes, UA NEGATIVE NEGATIVE  Protime-INR  Result Value Ref Range   Prothrombin Time 23.1 (H) 11.6 - 15.2 seconds   INR 2.07 (H) 0.00 - 1.49  Urine microscopic-add on  Result Value Ref Range   Squamous Epithelial / LPF 0-5 (A) NONE SEEN   WBC, UA 0-5 0 - 5 WBC/hpf   RBC / HPF 0-5 0 - 5 RBC/hpf   Bacteria, UA RARE (A) NONE SEEN   Casts HYALINE CASTS (A) NEGATIVE   Ct Abdomen Pelvis Wo Contrast  04/03/2015  CLINICAL DATA:  Left-sided abdominal pain with nausea. EXAM: CT ABDOMEN AND PELVIS WITHOUT CONTRAST TECHNIQUE: Multidetector CT  imaging of the abdomen and pelvis was performed following the standard protocol without IV contrast. COMPARISON:  03/08/2015.  08/06/2005. FINDINGS: Lower chest: Chronic interstitial changes in the lung bases. The heart is enlarged. Pericardial effusion is incompletely visualized and is stable to slightly increased in the interval, remaining small to moderate in size. Hepatobiliary: No focal abnormality in the liver on this study without intravenous contrast. No evidence of hepatomegaly. Gallbladder is surgically absent. No intrahepatic or extrahepatic biliary dilation. Pancreas: No focal mass lesion. No dilatation of the main duct. No intraparenchymal cyst. No peripancreatic edema. Spleen: No splenomegaly. No focal mass lesion. Adrenals/Urinary Tract: No adrenal nodule or mass. 8 mm hyper attenuating lesion in the interpolar right kidney is stable. 13 mm hyper attenuating lesion in the interpolar right kidney is stable. 2.3 cm exophytic water density lesion in the lower pole the right kidney is unchanged remains compatible with a cyst. Stable appearing cyst in the interpolar left kidney. Apparent  exophytic lesion upper pole left kidney is seen to represent normal renal parenchyma with focal cortical scarring on the previous CT scan from 08/06/2005. No evidence for hydroureter. The urinary bladder appears normal for the degree of distention. Stomach/Bowel: Stomach is nondistended. No gastric wall thickening. No evidence of outlet obstruction. Duodenum is normally positioned as is the ligament of Treitz. No small bowel wall thickening. No small bowel dilatation. The terminal ileum is normal. The appendix is normal. Left colonic diverticulosis is evident. Pericolonic edema/ inflammation is seen around the distal descending colon and proximal sigmoid segment, suggesting acute diverticulitis. This pericolonic edema/inflammation is not substantially different than on the prior study and there is a stable small volume of fluid in the root of the sigmoid mesocolon. There is stable appearance of circumferential wall thickening in the mid sigmoid segment, likely related to muscular hypertrophy from underlying diverticulosis. Vascular/Lymphatic: There is abdominal aortic atherosclerosis without aneurysm. There is no gastrohepatic or hepatoduodenal ligament lymphadenopathy. No intraperitoneal or retroperitoneal lymphadenopathy. Small lymph nodes are identified in the sigmoid mesocolon. Reproductive: Uterus is surgically absent. There is no adnexal mass. Other: No intraperitoneal free fluid. Musculoskeletal: Bone windows reveal no worrisome lytic or sclerotic osseous lesions. IMPRESSION: 1. No substantial interval change in the pericolonic edema/inflammation associated with the distal descending and proximal sigmoid colon. Imaging features are compatible with acute diverticulitis although the lack of interval change may be related to persistent or recurrent disease. No evidence for perforation or abscess. Given the persistence, underlying neoplastic involvement should be considered. 2. Bilateral renal cysts. High  attenuation lesions in the right kidney may be related to proteinaceous or hemorrhagic cyst. Electronically Signed   By: Misty Stanley M.D.   On: 04/03/2015 17:54   Ct Abdomen Pelvis Wo Contrast  03/08/2015  CLINICAL DATA:  Abdominal mass left lower quadrant found on physical examination with abdominal pain for several months EXAM: CT ABDOMEN AND PELVIS WITHOUT CONTRAST TECHNIQUE: Multidetector CT imaging of the abdomen and pelvis was performed following the standard protocol without IV contrast. COMPARISON:  06/26/2011 FINDINGS: Lower chest: In large pericardial effusion is again identified. 3 mm pulmonary nodule image number 7 right lung base. This is stable from 2013 and therefore benign. Hepatobiliary: Status post cholecystectomy Pancreas: Negative Spleen: Negative Adrenals/Urinary Tract: Multiple bilateral renal cysts of varying size and attenuation similar to prior study. Mm low attenuation cysts lateral inter pole are left kidney represents change from prior study and is not fully evaluated without contrast but shows average attenuation of 0 consistent with cysts. No  hydronephrosis. Bladder normal. Stomach/Bowel: Stomach and small bowel are normal. Appendix is normal. There is extensive diverticulosis beginning at the junction of the descending and sigmoid colon and continuing through out the proximal half of the sigmoid colon. No oral contrast is seen within the sigmoid colon, and in the absence of oral and IV contrast evaluation is limited. There is clearly severe wall thickening involving the proximal half of the sigmoid colon with surrounding inflammatory change. There is a small volume of free fluid in the left pelvis in this area. Mild wall thickening continues into the distal sigmoid colon. Vascular/Lymphatic: Extensive atherosclerotic aortoiliac calcification. Numerous small nonpathologic mesenteric lymph nodes. Reproductive: No significant abnormalities Other: No evidence of abscess. Small volume  free fluid left pelvis related to the sigmoid colon inflammatory process. Musculoskeletal: Diffuse osteopenia.  No acute findings. IMPRESSION: Evidence of diverticulitis involving the sigmoid colon. Electronically Signed   By: Skipper Cliche M.D.   On: 03/08/2015 10:56      Sherwood Gambler, MD 04/03/15 [phone]

## 2015-04-03 NOTE — ED Notes (Signed)
Pt arrives via EMS from [facility] [facility] office sent over for further eval of diverticulitis symptoms. Pt recently admitted in Feb, discharged but denies improvement in symptoms since last admission. Denies recent fevers, nausea, vomiting, diarrhea. Pt rating pain 6/10, LLQ. cbg 111.

## 2015-04-03 NOTE — Progress Notes (Signed)
  Pharmacy Antibiotic Note  [name] is a 82 y.o. female admitted on 04/03/2015 with diverticulitis.  Pharmacy has been consulted for zosyn dosing.  Plan: Zosyn 3.375g IV q8h (4 hour infusion).  Monitor culture data, renal function and clinical course      Temp (24hrs), Avg:99.9 F (37.7 C), Min:98.3 F (36.8 C), Max:101.3 F (38.5 C)   Recent Labs Lab 04/03/15 1257 04/03/15 1435  WBC 13.3* 13.3*  CREATININE  --  1.11*    Estimated Creatinine Clearance: 30.3 mL/min (by C-G formula based on Cr of 1.11).    Allergies  Allergen Reactions  . Iodinated Diagnostic Agents Hives    HIVES 15MIN S/P IV CONTRAST INJECTION,WILL NEED 13 HR PREP FOR FUTURE INJECTIONS, ok s/p 50mg  po benadryl//a.calhoun  . Anti-Inflammatory Enzyme [Nutritional Supplements]     Retains fluids and headaches  . Arthrotec [Diclofenac-Misoprostol] Other (See Comments)    unknown  . Biaxin [Clarithromycin] Other (See Comments)    unknown  . Plavix [Clopidogrel Bisulfate] Other (See Comments)    unknown  . Spironolactone     Hair loss    Antimicrobials this admission: Zosyn 3/8 >>    >>   Dose adjustments this admission: n/a  Microbiology results: 3/8 BCx: sent 3/8 UCx: sent   Sputum:    MRSA PCR:    [name]. Diona Foley, [name], [place] Clinical Pharmacist Pager [phone] 04/03/2015 8:43 PM

## 2015-04-03 NOTE — Progress Notes (Signed)
ANTICOAGULATION CONSULT NOTE - Initial Consult  Pharmacy Consult for Warfarin Indication: atrial fibrillation  Allergies  Allergen Reactions  . Iodinated Diagnostic Agents Hives    HIVES 15MIN S/P IV CONTRAST INJECTION,WILL NEED 13 HR PREP FOR FUTURE INJECTIONS, ok s/p 50mg  po benadryl//a.calhoun  . Anti-Inflammatory Enzyme [Nutritional Supplements]     Retains fluids and headaches  . Arthrotec [Diclofenac-Misoprostol] Other (See Comments)    unknown  . Biaxin [Clarithromycin] Other (See Comments)    unknown  . Plavix [Clopidogrel Bisulfate] Other (See Comments)    unknown  . Spironolactone     Hair loss    Patient Measurements:   Heparin Dosing Weight:   Vital Signs: Temp: 100.4 F (38 C) (03/08 1923) Temp Source: Oral (03/08 1923) BP: 113/53 mmHg (03/08 1930) Pulse Rate: 60 (03/08 1930)  Labs:  Recent Labs  04/03/15 1257 04/03/15 1435  HGB 11.8* 11.5*  HCT 34.1* 35.4*  PLT  --  350  LABPROT  --  23.1*  INR  --  2.07*  CREATININE  --  1.11*    Estimated Creatinine Clearance: 30.3 mL/min (by C-G formula based on Cr of 1.11).   Medical History: Past Medical History  Diagnosis Date  . Pericardial effusion     a. s/p pericardial window 01/16/15  . Atrial fibrillation (HCC)     Chronic  . Chronic anticoagulation     on Coumadin  . History of GI bleed     once treated with Fe infusion  . HTN (hypertension)   . Pacemaker     a. Medtronic, placed for tachybrady.   . Obesity   . LVH (left ventricular hypertrophy)   . Sleep apnea     mild-no cpap  . Positive TB test   . Anemia   . Stroke [facility])     a. 2013: right frontal  . Arthritis   . Tachycardia-bradycardia syndrome [facility])     a. s/p Medtronic Sensia  model number [id number], serial number [id number] H 12/2013  . History of shingles   . Chronic diastolic (congestive) heart failure ([place])   . Chronic atrial fibrillation (HCC)     a. on coumadin, digoxin and calan  . GI bleed     a. 2/2 AVMs     Medications:  Prescriptions prior to admission  Medication Sig Dispense Refill Last Dose  . Digoxin 62.5 MCG TABS Take 0.0625 mg by mouth daily. Take only 5 days a week; skip Wednesdays and Sundays 30 tablet 6 04/02/2015 at Unknown time  . hydrocortisone-pramoxine (ANALPRAM-HC) 2.5-1 % rectal cream Place 1 application rectally 3 (three) times daily.   04/03/2015 at Unknown time  . polyethylene glycol (MIRALAX / GLYCOLAX) packet Take 17 g by mouth daily.   04/03/2015 at Unknown time  . potassium chloride (K-DUR,KLOR-CON) 10 MEQ tablet Take 20 mEq by mouth daily. Take with Torsemide   04/03/2015 at Unknown time  . torsemide (DEMADEX) 20 MG tablet Take 3 tablets (60 mg total) by mouth daily. 180 tablet 6 04/03/2015 at Unknown time  . verapamil (CALAN-SR) 180 MG CR tablet take 1 tablet by mouth at bedtime 30 tablet 11 04/02/2015 at Unknown time  . warfarin (COUMADIN) 5 MG tablet Take 1 tablet by mouth daily or as directed by coumadin clinic (Patient taking differently: 2.5-5 mg. Monday Wednesday Friday Saturday (2.5 mg ) 5 mg all other days) 90 tablet 1 04/02/2015 at Unknown time  . metolazone (ZAROXOLYN) 2.5 MG tablet TAKE 1 TABLET 30 MINUTES PRIOR TO TORSEMIDE DOSE ON Wednesday  AND Saturday. (Patient not taking: Reported on 04/03/2015) 30 tablet 3 Not Taking at Unknown time  . ondansetron (ZOFRAN ODT) 4 MG disintegrating tablet Take 1 tablet (4 mg total) by mouth every 8 (eight) hours as needed for nausea or vomiting. (Patient not taking: Reported on 04/03/2015) 20 tablet 0 Not Taking at Unknown time   Scheduled:  . Barium Sulfate      . [START ON 04/04/2015] Digoxin  0.0625 mg Oral Daily  . hydrocortisone-pramoxine  1 application Rectal TID  . potassium chloride  40 mEq Oral Daily  . sodium chloride flush  3 mL Intravenous Q12H  . [START ON 04/04/2015] torsemide  60 mg Oral Daily  . verapamil  180 mg Oral QHS   Infusions:  . sodium chloride      Assessment: 82yo female with history of Afib on warfarin PTA  presents with abdominal pain. Pharmacy is consulted to dose warfarin for atrial fibrillation.  PTA Warfarin Dose: 2.5mg  MWF and 5mg  AODs with last dose 3/7  Goal of Therapy:  INR 2-3 Monitor platelets by anticoagulation protocol: Yes   Plan:  Warfarin 2.5mg  tonight x1 Daily INR Monitor s/sx of bleeding  [name]. Diona Foley, [name], [place] Pharmacist Pager [phone] 04/03/2015,8:12 PM

## 2015-04-03 NOTE — Progress Notes (Signed)
Notified on call of patient having temperature of 101.3 and refusing Tylenol.

## 2015-04-03 NOTE — ED Notes (Signed)
Report attempted 

## 2015-04-03 NOTE — ED Provider Notes (Signed)
CSN: [id number]     Arrival date & time 04/03/15  1400 History   First MD Initiated Contact with Patient 04/03/15 1401     Chief Complaint  Patient presents with  . Abdominal Pain     (Consider location/radiation/quality/duration/timing/severity/associated sxs/prior Treatment) [name] presenting with abdominal pain.  Abdominal Pain Pain location:  LLQ Pain quality comment:  "like an abscess" Pain radiates to:  Does not radiate Pain severity:  Severe Onset quality:  Gradual Duration:  2 days Progression:  Worsening Chronicity:  Recurrent (was admitted February for diveriticulitis, discharged with po abx, improvfed, gave rx for amoxicillin last thursday gi DOCTOR, seemed improfved until yesterda) Associated symptoms: constipation and fever   Associated symptoms: no chest pain, no cough, no diarrhea, no dysuria, no nausea, no shortness of breath, no sore throat, no vaginal bleeding, no vaginal discharge and no vomiting   Risk factors: being elderly     Past Medical History  Diagnosis Date  . Pericardial effusion     a. s/p pericardial window 01/16/15  . Atrial fibrillation (HCC)     Chronic  . Chronic anticoagulation     on Coumadin  . History of GI bleed     once treated with Fe infusion  . HTN (hypertension)   . Pacemaker     a. Medtronic, placed for tachybrady.   . Obesity   . LVH (left ventricular hypertrophy)   . Sleep apnea     mild-no cpap  . Positive TB test   . Anemia   . Stroke [facility])     a. 2013: right frontal  . Arthritis   . Tachycardia-bradycardia syndrome [facility])     a. s/p Medtronic Sensia  model number [id number], serial number [id number] H 12/2013  . History of shingles   . Chronic diastolic (congestive) heart failure ([place])   . Chronic atrial fibrillation (HCC)     a. on coumadin, digoxin and calan  . GI bleed     a. 2/2 AVMs   Past Surgical History  Procedure Laterality Date  . Breast lumpectomy Bilateral     negative for cancer  .  Esophagogastroduodenoscopy  05/19/2011    Procedure: ESOPHAGOGASTRODUODENOSCOPY (EGD);  Surgeon: Lear Ng, MD;  Location: [facility] ENDOSCOPY;  Service: Endoscopy;  Laterality: N/A;  doctor aware of inr   will try to be here no later than 230  . Cholecystectomy  2005  . Esophagogastroduodenoscopy  06/22/2011    Procedure: ESOPHAGOGASTRODUODENOSCOPY (EGD);  Surgeon: Arta Silence, MD;  Location: [facility] ENDOSCOPY;  Service: Endoscopy;  Laterality: N/A;  Check PT/INR in am  . Givens capsule study  06/23/2011    Procedure: GIVENS CAPSULE STUDY;  Surgeon: Arta Silence, MD;  Location: [facility] ENDOSCOPY;  Service: Endoscopy;  Laterality: N/A;  . Colonoscopy  08/11/2011    Procedure: COLONOSCOPY;  Surgeon: Jeryl Columbia, MD;  Location: [facility] ENDOSCOPY;  Service: Endoscopy;  Laterality: N/A;  . Hot hemostasis  08/11/2011    Procedure: HOT HEMOSTASIS (ARGON PLASMA COAGULATION/BICAP);  Surgeon: Jeryl Columbia, MD;  Location: [facility] ENDOSCOPY;  Service: Endoscopy;  Laterality: N/A;  . Mass excision Left 09/14/2013    Procedure: EXCISION MASS LEFT WRIST;  Surgeon: Leanora Cover, MD;  Location: [place];  Service: Orthopedics;  Laterality: Left;  . Tonsillectomy and adenoidectomy  1950  . Insert / replace / remove pacemaker  2001    Last generator in 2006; interrogated Dec 2012  . Cataract extraction w/ intraocular lens  implant, bilateral  Bilateral   . Lipoma excision Left 07/2013    wrist  . Pacemaker generator change N/A 01/02/2014    Procedure: PACEMAKER GENERATOR CHANGE;  Surgeon: Sanda Klein, MD;  Location: [place] CATH LAB;  Service: Cardiovascular;  Laterality: N/A;  . Lead revision N/A 01/02/2014    Procedure: LEAD REVISION;  Surgeon: Sanda Klein, MD;  Location: [place] CATH LAB;  Service: Cardiovascular;  Laterality: N/A;  . Subxyphoid pericardial window N/A 01/16/2015    Procedure: SUBXYPHOID PERICARDIAL WINDOW;  Surgeon: Grace Isaac, MD;  Location: [place];  Service: Thoracic;  Laterality: N/A;  .  Tee without cardioversion N/A 01/16/2015    Procedure: TRANSESOPHAGEAL ECHOCARDIOGRAM (TEE);  Surgeon: Grace Isaac, MD;  Location: [facility] OR;  Service: Thoracic;  Laterality: N/A;   Family History  Problem Relation Age of Onset  . Stroke Mother   . Stroke Father   . Pneumonia Father    Social History  Substance Use Topics  . Smoking status: Never Smoker   . Smokeless tobacco: Never Used  . Alcohol Use: 0.0 oz/week    0 Standard drinks or equivalent per week     Comment: 01/02/2014 "I'll have a drink a few times/year"   OB History    No data available     Review of Systems  Constitutional: Positive for fever.  HENT: Negative for sore throat.   Eyes: Negative for visual disturbance.  Respiratory: Negative for cough and shortness of breath.   Cardiovascular: Negative for chest pain.  Gastrointestinal: Positive for abdominal pain and constipation. Negative for nausea, vomiting and diarrhea.  Genitourinary: Negative for dysuria, vaginal bleeding, vaginal discharge and difficulty urinating.  Musculoskeletal: Negative for back pain and neck pain.  Skin: Negative for rash.  Neurological: Negative for syncope and headaches.      Allergies  Iodinated diagnostic agents; Anti-inflammatory enzyme; Arthrotec; Biaxin; Plavix; and Spironolactone  Home Medications   Prior to Admission medications   Medication Sig Start Date End Date Taking? Authorizing Provider  Digoxin 62.5 MCG TABS Take 0.0625 mg by mouth daily. Take only 5 days a week; skip Wednesdays and Sundays 03/20/15  Yes Mihai Croitoru, MD  hydrocortisone-pramoxine [facility]) 2.5-1 % rectal cream Place 1 application rectally 3 (three) times daily.   Yes Historical Provider, MD  polyethylene glycol (MIRALAX / GLYCOLAX) packet Take 17 g by mouth daily.   Yes Historical Provider, MD  potassium chloride (K-DUR,KLOR-CON) 10 MEQ tablet Take 20 mEq by mouth daily. Take with Torsemide   Yes Historical Provider, MD  torsemide  (DEMADEX) 20 MG tablet Take 3 tablets (60 mg total) by mouth daily. 01/26/15  Yes Eileen Stanford, PA-C  verapamil (CALAN-SR) 180 MG CR tablet take 1 tablet by mouth at bedtime 03/26/15  Yes Mihai Croitoru, MD  warfarin (COUMADIN) 5 MG tablet Take 1 tablet by mouth daily or as directed by coumadin clinic Patient taking differently: 2.5-5 mg. Monday Wednesday Friday Saturday (2.5 mg ) 5 mg all other days 01/30/15  Yes Mihai Croitoru, MD  metolazone (ZAROXOLYN) 2.5 MG tablet TAKE 1 TABLET 30 MINUTES PRIOR TO TORSEMIDE DOSE ON Wednesday AND Saturday. Patient not taking: Reported on 04/03/2015 02/08/15   Evelene Croon Barrett, PA-C  ondansetron (ZOFRAN ODT) 4 MG disintegrating tablet Take 1 tablet (4 mg total) by mouth every 8 (eight) hours as needed for nausea or vomiting. Patient not taking: Reported on 04/03/2015 03/09/15   Tracyton N Rumley, DO   BP 133/47 mmHg  Pulse 70  Temp(Src) 101.3 F (38.5 C) (Oral)  Resp 18  SpO2 88% Physical Exam  Constitutional: She is oriented to person, place, and time. She appears well-developed and well-nourished. No distress.  HENT:  Head: Normocephalic and atraumatic.  Eyes: Conjunctivae and EOM are normal.  Neck: Normal range of motion.  Cardiovascular: Normal rate, regular rhythm, normal heart sounds and intact distal pulses.  Exam reveals no gallop and no friction rub.   No murmur heard. Pulmonary/Chest: Effort normal and breath sounds normal. No respiratory distress. She has no wheezes. She has no rales.  Abdominal: Soft. She exhibits no distension. There is tenderness. There is guarding.  Musculoskeletal: She exhibits no edema or tenderness.  Neurological: She is alert and oriented to person, place, and time.  Skin: Skin is warm and dry. No rash noted. She is not diaphoretic. No erythema.  Nursing note and vitals reviewed.   ED Course  Procedures (including critical care time) Labs Review Labs Reviewed  CBC WITH DIFFERENTIAL/PLATELET - Abnormal; Notable  for the following:    WBC 13.3 (*)    Hemoglobin 11.5 (*)    HCT 35.4 (*)    Neutro Abs 10.9 (*)    Monocytes Absolute 1.3 (*)    All other components within normal limits  COMPREHENSIVE METABOLIC PANEL - Abnormal; Notable for the following:    Potassium 3.1 (*)    Chloride 98 (*)    Creatinine, Ser 1.11 (*)    Albumin 3.2 (*)    GFR calc non Af Amer 42 (*)    GFR calc Af Amer 49 (*)    All other components within normal limits  URINALYSIS, ROUTINE W REFLEX MICROSCOPIC (NOT AT [facility]) - Abnormal; Notable for the following:    Hgb urine dipstick SMALL (*)    All other components within normal limits  PROTIME-INR - Abnormal; Notable for the following:    Prothrombin Time 23.1 (*)    INR 2.07 (*)    All other components within normal limits  URINE MICROSCOPIC-ADD ON - Abnormal; Notable for the following:    Squamous Epithelial / LPF 0-5 (*)    Bacteria, UA RARE (*)    Casts HYALINE CASTS (*)    All other components within normal limits  URINE CULTURE  CULTURE, BLOOD (ROUTINE X 2)  CULTURE, BLOOD (ROUTINE X 2)  LIPASE, BLOOD  CBC  BASIC METABOLIC PANEL  PROTIME-INR  C-REACTIVE PROTEIN    Imaging Review Ct Abdomen Pelvis Wo Contrast  04/03/2015  CLINICAL DATA:  Left-sided abdominal pain with nausea. EXAM: CT ABDOMEN AND PELVIS WITHOUT CONTRAST TECHNIQUE: Multidetector CT imaging of the abdomen and pelvis was performed following the standard protocol without IV contrast. COMPARISON:  03/08/2015.  08/06/2005. FINDINGS: Lower chest: Chronic interstitial changes in the lung bases. The heart is enlarged. Pericardial effusion is incompletely visualized and is stable to slightly increased in the interval, remaining small to moderate in size. Hepatobiliary: No focal abnormality in the liver on this study without intravenous contrast. No evidence of hepatomegaly. Gallbladder is surgically absent. No intrahepatic or extrahepatic biliary dilation. Pancreas: No focal mass lesion. No dilatation of  the main duct. No intraparenchymal cyst. No peripancreatic edema. Spleen: No splenomegaly. No focal mass lesion. Adrenals/Urinary Tract: No adrenal nodule or mass. 8 mm hyper attenuating lesion in the interpolar right kidney is stable. 13 mm hyper attenuating lesion in the interpolar right kidney is stable. 2.3 cm exophytic water density lesion in the lower pole the right kidney is unchanged remains compatible with a cyst. Stable appearing cyst in the interpolar  left kidney. Apparent exophytic lesion upper pole left kidney is seen to represent normal renal parenchyma with focal cortical scarring on the previous CT scan from 08/06/2005. No evidence for hydroureter. The urinary bladder appears normal for the degree of distention. Stomach/Bowel: Stomach is nondistended. No gastric wall thickening. No evidence of outlet obstruction. Duodenum is normally positioned as is the ligament of Treitz. No small bowel wall thickening. No small bowel dilatation. The terminal ileum is normal. The appendix is normal. Left colonic diverticulosis is evident. Pericolonic edema/ inflammation is seen around the distal descending colon and proximal sigmoid segment, suggesting acute diverticulitis. This pericolonic edema/inflammation is not substantially different than on the prior study and there is a stable small volume of fluid in the root of the sigmoid mesocolon. There is stable appearance of circumferential wall thickening in the mid sigmoid segment, likely related to muscular hypertrophy from underlying diverticulosis. Vascular/Lymphatic: There is abdominal aortic atherosclerosis without aneurysm. There is no gastrohepatic or hepatoduodenal ligament lymphadenopathy. No intraperitoneal or retroperitoneal lymphadenopathy. Small lymph nodes are identified in the sigmoid mesocolon. Reproductive: Uterus is surgically absent. There is no adnexal mass. Other: No intraperitoneal free fluid. Musculoskeletal: Bone windows reveal no worrisome  lytic or sclerotic osseous lesions. IMPRESSION: 1. No substantial interval change in the pericolonic edema/inflammation associated with the distal descending and proximal sigmoid colon. Imaging features are compatible with acute diverticulitis although the lack of interval change may be related to persistent or recurrent disease. No evidence for perforation or abscess. Given the persistence, underlying neoplastic involvement should be considered. 2. Bilateral renal cysts. High attenuation lesions in the right kidney may be related to proteinaceous or hemorrhagic cyst. Electronically Signed   By: Misty Stanley M.D.   On: 04/03/2015 17:54   I have personally reviewed and evaluated these images and lab results as part of my medical decision-making.   EKG Interpretation None      MDM   Final diagnoses:  Left sided abdominal pain   24 female with history of hypertension, CVA, DM, atrial fibrillation on coumadin, diverticulitis with recent hospitalization, presents with concern for recurrence of LLQ pain.  Concern for persistent or recurrent diverticulitis by hx and exam.  No sign of UTI.  Temp 100.1 rectally, and blood cx drawn and zosyn given for concern for intraabdominal infection.  CT pending at time of transfer of care to Dr. Regenia Skeeter.   Gareth Morgan, MD 04/03/15 2134

## 2015-04-04 ENCOUNTER — Encounter (HOSPITAL_COMMUNITY): Payer: Self-pay | Admitting: General Surgery

## 2015-04-04 ENCOUNTER — Ambulatory Visit: Payer: Medicare Other | Admitting: Emergency Medicine

## 2015-04-04 DIAGNOSIS — K5733 Diverticulitis of large intestine without perforation or abscess with bleeding: Secondary | ICD-10-CM

## 2015-04-04 DIAGNOSIS — A047 Enterocolitis due to Clostridium difficile: Secondary | ICD-10-CM | POA: Diagnosis present

## 2015-04-04 DIAGNOSIS — I872 Venous insufficiency (chronic) (peripheral): Secondary | ICD-10-CM | POA: Diagnosis present

## 2015-04-04 DIAGNOSIS — Z9049 Acquired absence of other specified parts of digestive tract: Secondary | ICD-10-CM | POA: Diagnosis not present

## 2015-04-04 DIAGNOSIS — D500 Iron deficiency anemia secondary to blood loss (chronic): Secondary | ICD-10-CM | POA: Diagnosis present

## 2015-04-04 DIAGNOSIS — R109 Unspecified abdominal pain: Secondary | ICD-10-CM | POA: Diagnosis not present

## 2015-04-04 DIAGNOSIS — Z8379 Family history of other diseases of the digestive system: Secondary | ICD-10-CM | POA: Diagnosis not present

## 2015-04-04 DIAGNOSIS — I495 Sick sinus syndrome: Secondary | ICD-10-CM | POA: Diagnosis not present

## 2015-04-04 DIAGNOSIS — K5900 Constipation, unspecified: Secondary | ICD-10-CM | POA: Diagnosis present

## 2015-04-04 DIAGNOSIS — Z7901 Long term (current) use of anticoagulants: Secondary | ICD-10-CM | POA: Diagnosis not present

## 2015-04-04 DIAGNOSIS — R791 Abnormal coagulation profile: Secondary | ICD-10-CM | POA: Diagnosis not present

## 2015-04-04 DIAGNOSIS — Z823 Family history of stroke: Secondary | ICD-10-CM | POA: Diagnosis not present

## 2015-04-04 DIAGNOSIS — Z950 Presence of cardiac pacemaker: Secondary | ICD-10-CM | POA: Diagnosis not present

## 2015-04-04 DIAGNOSIS — I482 Chronic atrial fibrillation: Secondary | ICD-10-CM

## 2015-04-04 DIAGNOSIS — K5732 Diverticulitis of large intestine without perforation or abscess without bleeding: Secondary | ICD-10-CM | POA: Diagnosis present

## 2015-04-04 DIAGNOSIS — I319 Disease of pericardium, unspecified: Secondary | ICD-10-CM | POA: Diagnosis not present

## 2015-04-04 DIAGNOSIS — R1032 Left lower quadrant pain: Secondary | ICD-10-CM | POA: Diagnosis present

## 2015-04-04 DIAGNOSIS — I480 Paroxysmal atrial fibrillation: Secondary | ICD-10-CM | POA: Diagnosis present

## 2015-04-04 DIAGNOSIS — E119 Type 2 diabetes mellitus without complications: Secondary | ICD-10-CM | POA: Diagnosis present

## 2015-04-04 DIAGNOSIS — Z800 Family history of malignant neoplasm of digestive organs: Secondary | ICD-10-CM | POA: Diagnosis not present

## 2015-04-04 DIAGNOSIS — E0865 Diabetes mellitus due to underlying condition with hyperglycemia: Secondary | ICD-10-CM | POA: Diagnosis not present

## 2015-04-04 DIAGNOSIS — I10 Essential (primary) hypertension: Secondary | ICD-10-CM | POA: Diagnosis present

## 2015-04-04 DIAGNOSIS — Z8719 Personal history of other diseases of the digestive system: Secondary | ICD-10-CM | POA: Diagnosis not present

## 2015-04-04 DIAGNOSIS — Z8673 Personal history of transient ischemic attack (TIA), and cerebral infarction without residual deficits: Secondary | ICD-10-CM | POA: Diagnosis not present

## 2015-04-04 LAB — BASIC METABOLIC PANEL
ANION GAP: 13 (ref 5–15)
BUN: 13 mg/dL (ref 6–20)
CHLORIDE: 98 mmol/L — AB (ref 101–111)
CO2: 25 mmol/L (ref 22–32)
Calcium: 8.6 mg/dL — ABNORMAL LOW (ref 8.9–10.3)
Creatinine, Ser: 1.39 mg/dL — ABNORMAL HIGH (ref 0.44–1.00)
GFR calc non Af Amer: 32 mL/min — ABNORMAL LOW (ref >60)
GFR, EST AFRICAN AMERICAN: 37 mL/min — AB (ref >60)
GLUCOSE: 109 mg/dL — AB (ref 65–99)
POTASSIUM: 3.6 mmol/L (ref 3.5–5.1)
Sodium: 136 mmol/L (ref 135–145)

## 2015-04-04 LAB — CBC
HEMATOCRIT: 31.5 % — AB (ref 36.0–46.0)
HEMOGLOBIN: 10.1 g/dL — AB (ref 12.0–15.0)
MCH: 28.6 pg (ref 26.0–34.0)
MCHC: 32.1 g/dL (ref 30.0–36.0)
MCV: 89.2 fL (ref 78.0–100.0)
Platelets: 283 10*3/uL (ref 150–400)
RBC: 3.53 MIL/uL — ABNORMAL LOW (ref 3.87–5.11)
RDW: 14.2 % (ref 11.5–15.5)
WBC: 16.1 10*3/uL — ABNORMAL HIGH (ref 4.0–10.5)

## 2015-04-04 LAB — GLUCOSE, CAPILLARY
GLUCOSE-CAPILLARY: 126 mg/dL — AB (ref 65–99)
GLUCOSE-CAPILLARY: 109 mg/dL — AB (ref 65–99)
Glucose-Capillary: 97 mg/dL (ref 65–99)
Glucose-Capillary: 91 mg/dL (ref 65–99)

## 2015-04-04 LAB — URINE CULTURE: CULTURE: NO GROWTH

## 2015-04-04 LAB — PROTIME-INR
INR: 2.52 — AB (ref 0.00–1.49)
Prothrombin Time: 26.9 seconds — ABNORMAL HIGH (ref 11.6–15.2)

## 2015-04-04 MED ORDER — SODIUM CHLORIDE 0.9 % IV SOLN
500.0000 mg | INTRAVENOUS | Status: DC
  Administered 2015-04-04 – 2015-04-07 (×4): 0.5 g via INTRAVENOUS
  Filled 2015-04-04 (×4): qty 0.5

## 2015-04-04 MED ORDER — WARFARIN - PHARMACIST DOSING INPATIENT: Freq: Every day | Status: DC

## 2015-04-04 MED ORDER — FLUCONAZOLE IN SODIUM CHLORIDE 200-0.9 MG/100ML-% IV SOLN
200.0000 mg | INTRAVENOUS | Status: DC
  Administered 2015-04-04 – 2015-04-10 (×7): 200 mg via INTRAVENOUS
  Filled 2015-04-04 (×9): qty 100

## 2015-04-04 MED ORDER — WARFARIN SODIUM 5 MG PO TABS
5.0000 mg | ORAL_TABLET | Freq: Once | ORAL | Status: AC
  Administered 2015-04-04: 5 mg via ORAL
  Filled 2015-04-04: qty 1

## 2015-04-04 MED ORDER — ASPIRIN 325 MG PO TABS
325.0000 mg | ORAL_TABLET | Freq: Once | ORAL | Status: AC
  Administered 2015-04-04: 325 mg via ORAL
  Filled 2015-04-04: qty 1

## 2015-04-04 NOTE — Progress Notes (Signed)
Pacemaker representative to interrogate device for pacer spikes falling within ST segment.

## 2015-04-04 NOTE — Progress Notes (Signed)
Family Medicine Teaching Service Daily Progress Note Intern Pager: [phone]  Patient name: [name] Medical record number: [id number] Date of birth: [birth year] Age: 82 y.o. Gender: female  Primary Care Provider: [name], MD Consultants: Surgery Code Status: Full  Pt Overview and Major Events to Date:  Admitted 04/03/15  Assessment and Plan: [name] is a 82 y.o. female presenting with abdominal pain. PMH is significant for atrial fibrillation, hx of CVA, hypertension, type 2 diabetes, tachycardia-bradycardia syndrome s/p pacemaker, prior pericardial effusion s/p pericardial window 12/16, sleep apnea (no CPAP), GI bleeding with hemorrhoids, and arthritis. Pain is likely due to recurrent vs. unresolved diverticulitis. However, CT, exam and family history concerning for colonic mass.   #Left Lower Quadrant Abdominal pain: Worsening left lower quadrant pain with fevers and poor PO. Concern was for perforated or abscess from diverticuilits which patient has been treated for over the last month. Per PCP note patient was seen by GI (Dr. Watt Climes) as outpatient. Patient with hx of AVMs cauterized in 2013 during colonoscopy, also hx of hemorrhoids. Patient's sister had colon cancer, and her daughter had an APR. Stable vitals. Slight leukocytosis to 13.3, increased to 16.1. CT with evidence of left colonic diverticulitis with pericolonic edema, consistent with acute inflammation but unchanged from last month's imaging. Cannot rule out cancerous process. Lower quadrant pain differential includes acute cystitis versus acute colitis versus diverticulitis. Exam concerning for guarding and distension but CT abdomen reassuring against acute abdomen. Prior to admission, patient had been tolerating food but in decreased amounts. She continues to have fevers. - Continue IV zosyn, would hold off on quick transition to oral antibiotics since patient has failed 2 different oral regimens as outpatient  -  CRP elevated at 3.5 compared to 0.6 at last admission - Urine culture pending - Blood culture pending - Obtain daily CBCs and BMETs - Continue bowel rest until surgery has evaluated patient - trend fevers - Strict I's and O's - IVFs while NPO - Morphine 2 mg q4h for pain - Patient refuses tylenol (hurts her liver) - Consulted general surgery for concern for need for resection. Appreciate recommendations.   #Anemia: hgb 10.1, down from 11.5 on admission (baseline 11.5). Long-term history of rectal bleeding. With history of hemorrhoids. - Monitor with am CBCs - FOBT negative in ED (performed by this examiner) - Monitor for GI bleed  # Atrial fibrillation: ECHO EF 0000000, no diastolic dysfunction. - Continue Verapamil and Digoxin  - Continue Coumadin per pharmacy (FOBT negative and hgb at baseline) - INR 2.07 on admission, 2.52 today  # Type 2 diabetes mellitus: A1C 6.2  - SSI - Continue ACQHS  #Lower extremity edema: Trace. Patient says she usually wears compression hose. Documented history of CHF but ECHO in January 2017 w/ normal EF and no diastolic dysfunction, some moderate valvular disease. Has stasis dermatitis of lower legs.  - Hold home Torsemide and metolazone while providing IVF; restart if signs of overload - [place]ict I/Os - Daily weights  FEN/GI: NS @100  mL/hr while NPO Prophylaxis: SCDs (on warfarin)   Disposition: Observation on telemetry, home pending clinical improvement   Subjective:  Patient says abdominal pain is still present but is no worse. She endorses hunger and is interested to know when she can eat again.   Objective: Temp:  [98.3 F (36.8 C)-101.3 F (38.5 C)] 100.9 F (38.3 C) (03/09 0628) Pulse Rate:  [59-101] 101 (03/09 0628) Resp:  [16-24] 18 (03/09 0628) BP: (106-133)/(47-97) 106/48 mmHg (03/09 [id number])  SpO2:  [88 %-100 %] 92 % (03/09 0628) Weight:  [145 lb 12.8 oz (66.134 kg)-148 lb 3.2 oz (67.223 kg)] 148 lb 3.2 oz (67.223 kg) (03/08  2017) Physical Exam: General: Well-appearing female, sitting up in bed, visiting with family Cardiovascular: 3/6 holosytolic murmur best heard over right upper sternal border, RRR Chest: Lungs CTAB. No increased WOB. Pacemaker present over right upper chest.  Abdomen: ++BS, diffuse tenderness with guarding over lower abdomen and sharply tender over mass-like area in LLQ Extremities: WWP, moves all spontaneously  Laboratory:  Recent Labs Lab 04/03/15 1257 04/03/15 1435 04/04/15 0515  WBC 13.3* 13.3* 16.1*  HGB 11.8* 11.5* 10.1*  HCT 34.1* 35.4* 31.5*  PLT  --  350 283    Recent Labs Lab 04/03/15 1435 04/04/15 0515  NA 140 136  K 3.1* 3.6  CL 98* 98*  CO2 31 25  BUN 13 13  CREATININE 1.11* 1.39*  CALCIUM 9.1 8.6*  PROT 6.8  --   BILITOT 0.7  --   ALKPHOS 69  --   ALT 24  --   AST 34  --   GLUCOSE 95 109*    Imaging/Diagnostic Tests: Ct Abdomen Pelvis Wo Contrast  04/03/2015  CLINICAL DATA:  Left-sided abdominal pain with nausea. EXAM: CT ABDOMEN AND PELVIS WITHOUT CONTRAST TECHNIQUE: Multidetector CT imaging of the abdomen and pelvis was performed following the standard protocol without IV contrast. COMPARISON:  03/08/2015.  08/06/2005. FINDINGS: Lower chest: Chronic interstitial changes in the lung bases. The heart is enlarged. Pericardial effusion is incompletely visualized and is stable to slightly increased in the interval, remaining small to moderate in size. Hepatobiliary: No focal abnormality in the liver on this study without intravenous contrast. No evidence of hepatomegaly. Gallbladder is surgically absent. No intrahepatic or extrahepatic biliary dilation. Pancreas: No focal mass lesion. No dilatation of the main duct. No intraparenchymal cyst. No peripancreatic edema. Spleen: No splenomegaly. No focal mass lesion. Adrenals/Urinary Tract: No adrenal nodule or mass. 8 mm hyper attenuating lesion in the interpolar right kidney is stable. 13 mm hyper attenuating lesion  in the interpolar right kidney is stable. 2.3 cm exophytic water density lesion in the lower pole the right kidney is unchanged remains compatible with a cyst. Stable appearing cyst in the interpolar left kidney. Apparent exophytic lesion upper pole left kidney is seen to represent normal renal parenchyma with focal cortical scarring on the previous CT scan from 08/06/2005. No evidence for hydroureter. The urinary bladder appears normal for the degree of distention. Stomach/Bowel: Stomach is nondistended. No gastric wall thickening. No evidence of outlet obstruction. Duodenum is normally positioned as is the ligament of Treitz. No small bowel wall thickening. No small bowel dilatation. The terminal ileum is normal. The appendix is normal. Left colonic diverticulosis is evident. Pericolonic edema/ inflammation is seen around the distal descending colon and proximal sigmoid segment, suggesting acute diverticulitis. This pericolonic edema/inflammation is not substantially different than on the prior study and there is a stable small volume of fluid in the root of the sigmoid mesocolon. There is stable appearance of circumferential wall thickening in the mid sigmoid segment, likely related to muscular hypertrophy from underlying diverticulosis. Vascular/Lymphatic: There is abdominal aortic atherosclerosis without aneurysm. There is no gastrohepatic or hepatoduodenal ligament lymphadenopathy. No intraperitoneal or retroperitoneal lymphadenopathy. Small lymph nodes are identified in the sigmoid mesocolon. Reproductive: Uterus is surgically absent. There is no adnexal mass. Other: No intraperitoneal free fluid. Musculoskeletal: Bone windows reveal no worrisome lytic or sclerotic osseous lesions. IMPRESSION:  1. No substantial interval change in the pericolonic edema/inflammation associated with the distal descending and proximal sigmoid colon. Imaging features are compatible with acute diverticulitis although the lack of  interval change may be related to persistent or recurrent disease. No evidence for perforation or abscess. Given the persistence, underlying neoplastic involvement should be considered. 2. Bilateral renal cysts. High attenuation lesions in the right kidney may be related to proteinaceous or hemorrhagic cyst. Electronically Signed   By: Misty Stanley M.D.   On: 04/03/2015 17:54   Ct Abdomen Pelvis Wo Contrast  03/08/2015  CLINICAL DATA:  Abdominal mass left lower quadrant found on physical examination with abdominal pain for several months EXAM: CT ABDOMEN AND PELVIS WITHOUT CONTRAST TECHNIQUE: Multidetector CT imaging of the abdomen and pelvis was performed following the standard protocol without IV contrast. COMPARISON:  06/26/2011 FINDINGS: Lower chest: In large pericardial effusion is again identified. 3 mm pulmonary nodule image number 7 right lung base. This is stable from 2013 and therefore benign. Hepatobiliary: Status post cholecystectomy Pancreas: Negative Spleen: Negative Adrenals/Urinary Tract: Multiple bilateral renal cysts of varying size and attenuation similar to prior study. Mm low attenuation cysts lateral inter pole are left kidney represents change from prior study and is not fully evaluated without contrast but shows average attenuation of 0 consistent with cysts. No hydronephrosis. Bladder normal. Stomach/Bowel: Stomach and small bowel are normal. Appendix is normal. There is extensive diverticulosis beginning at the junction of the descending and sigmoid colon and continuing through out the proximal half of the sigmoid colon. No oral contrast is seen within the sigmoid colon, and in the absence of oral and IV contrast evaluation is limited. There is clearly severe wall thickening involving the proximal half of the sigmoid colon with surrounding inflammatory change. There is a small volume of free fluid in the left pelvis in this area. Mild wall thickening continues into the distal sigmoid  colon. Vascular/Lymphatic: Extensive atherosclerotic aortoiliac calcification. Numerous small nonpathologic mesenteric lymph nodes. Reproductive: No significant abnormalities Other: No evidence of abscess. Small volume free fluid left pelvis related to the sigmoid colon inflammatory process. Musculoskeletal: Diffuse osteopenia.  No acute findings. IMPRESSION: Evidence of diverticulitis involving the sigmoid colon. Electronically Signed   By: Skipper Cliche M.D.   On: 03/08/2015 10:56    Luwanna Brossman Corinda Gubler, MD 04/04/2015, 7:35 AM PGY-1, [place] Intern pager: [phone], text pages welcome

## 2015-04-04 NOTE — Consult Note (Signed)
Reason for Consult:recurrent diverticulitis Referring Physician: Dr. Chrisandra Netters   HPI: [name] is a 82 year old female with a history of CHF, atrial fibrillation s/p pacemaker, on coumadin, CVA, HTN and DM II who presented with abdominal pain and fevers.  The patients recalls developing constipation and BRBPR back in September.  She was found to have  internal and external hemorrhoids s/p rubber band ligation by Dr. Joyice Faster.  She then developed mild and intermittent LLQ abdominal pain in December.  She continued to have constipation and bleeding.  Her pain acutely worsened which required admission 2/10-2/11.  CT at this time demonstrated acute diverticulitis.  She reports intermittent symptoms after she was discharged with a 7 day course of antibiotics.  She was seen by Dr. Watt Climes about 1 week ago and placed on amoxicillin which she completed this past Tuesday.  She endorses to ongoing pain associated with fevers of 101.8.  Denies nausea or vomiting.  Denies increase in pain.  Appetite has been fair.  Denies any significant weight loss.  Family history of colon cancer significant for daughter and sister.  The patient last had a colonoscopy in July 2013 by Dr. Watt Climes which revealed diverticuli and cecal AVMs. Ct scan of abdomen and pelvis 3/8 showed persistent pericolonic inflammation.  Given persistence, neoplastic involvement should be considered. We have therefore been asked to evaluate.   The patient is currently on Zosyn.  White count has increased from 13.3k to 16.1k.  T max 101.3.  The patient denies worsening pain.   Past Medical History  Diagnosis Date  . Pericardial effusion     a. s/p pericardial window 01/16/15  . Atrial fibrillation (HCC)     Chronic  . Chronic anticoagulation     on Coumadin  . History of GI bleed     once treated with Fe infusion  . HTN (hypertension)   . Pacemaker     a. Medtronic, placed for tachybrady.   . Obesity   . LVH (left ventricular  hypertrophy)   . Sleep apnea     mild-no cpap  . Positive TB test   . Anemia   . Stroke [facility])     a. 2013: right frontal  . Arthritis   . Tachycardia-bradycardia syndrome [facility])     a. s/p Medtronic Sensia  model number [id number], serial number [id number] H 12/2013  . History of shingles   . Chronic diastolic (congestive) heart failure ([place])   . Chronic atrial fibrillation (HCC)     a. on coumadin, digoxin and calan  . GI bleed     a. 2/2 AVMs    Past Surgical History  Procedure Laterality Date  . Breast lumpectomy Bilateral     negative for cancer  . Esophagogastroduodenoscopy  05/19/2011    Procedure: ESOPHAGOGASTRODUODENOSCOPY (EGD);  Surgeon: Lear Ng, MD;  Location: [facility] ENDOSCOPY;  Service: Endoscopy;  Laterality: N/A;  doctor aware of inr   will try to be here no later than 230  . Cholecystectomy  2005  . Esophagogastroduodenoscopy  06/22/2011    Procedure: ESOPHAGOGASTRODUODENOSCOPY (EGD);  Surgeon: Arta Silence, MD;  Location: [facility] ENDOSCOPY;  Service: Endoscopy;  Laterality: N/A;  Check PT/INR in am  . Givens capsule study  06/23/2011    Procedure: GIVENS CAPSULE STUDY;  Surgeon: Arta Silence, MD;  Location: [facility] ENDOSCOPY;  Service: Endoscopy;  Laterality: N/A;  . Colonoscopy  08/11/2011    Procedure: COLONOSCOPY;  Surgeon: Jeryl Columbia, MD;  Location: [facility] ENDOSCOPY;  Service: Endoscopy;  Laterality: N/A;  . Hot hemostasis  08/11/2011    Procedure: HOT HEMOSTASIS (ARGON PLASMA COAGULATION/BICAP);  Surgeon: Jeryl Columbia, MD;  Location: [facility] ENDOSCOPY;  Service: Endoscopy;  Laterality: N/A;  . Mass excision Left 09/14/2013    Procedure: EXCISION MASS LEFT WRIST;  Surgeon: Leanora Cover, MD;  Location: [place];  Service: Orthopedics;  Laterality: Left;  . Tonsillectomy and adenoidectomy  1950  . Insert / replace / remove pacemaker  2001    Last generator in 2006; interrogated Dec 2012  . Cataract extraction w/ intraocular lens  implant, bilateral Bilateral   .  Lipoma excision Left 07/2013    wrist  . Pacemaker generator change N/A 01/02/2014    Procedure: PACEMAKER GENERATOR CHANGE;  Surgeon: Sanda Klein, MD;  Location: [place] CATH LAB;  Service: Cardiovascular;  Laterality: N/A;  . Lead revision N/A 01/02/2014    Procedure: LEAD REVISION;  Surgeon: Sanda Klein, MD;  Location: [place] CATH LAB;  Service: Cardiovascular;  Laterality: N/A;  . Subxyphoid pericardial window N/A 01/16/2015    Procedure: SUBXYPHOID PERICARDIAL WINDOW;  Surgeon: Grace Isaac, MD;  Location: [place];  Service: Thoracic;  Laterality: N/A;  . Tee without cardioversion N/A 01/16/2015    Procedure: TRANSESOPHAGEAL ECHOCARDIOGRAM (TEE);  Surgeon: Grace Isaac, MD;  Location: [facility] OR;  Service: Thoracic;  Laterality: N/A;    Family History  Problem Relation Age of Onset  . Stroke Mother   . Stroke Father   . Pneumonia Father   . Colon cancer Sister   . Colon cancer Daughter     Social History:  reports that she has never smoked. She has never used smokeless tobacco. She reports that she drinks alcohol. She reports that she does not use illicit drugs.  Allergies:  Allergies  Allergen Reactions  . Iodinated Diagnostic Agents Hives    HIVES 15MIN S/P IV CONTRAST INJECTION,WILL NEED 13 HR PREP FOR FUTURE INJECTIONS, ok s/p '50mg'$  po benadryl//a.calhoun  . Anti-Inflammatory Enzyme [Nutritional Supplements]     Retains fluids and headaches  . Arthrotec [Diclofenac-Misoprostol] Other (See Comments)    unknown  . Biaxin [Clarithromycin] Other (See Comments)    unknown  . Plavix [Clopidogrel Bisulfate] Other (See Comments)    unknown  . Spironolactone     Hair loss    Medications:  Scheduled Meds: . acetaminophen  650 mg Oral 4 times per day  . digoxin  0.0625 mg Oral Daily  . ertapenem  500 mg Intravenous Q24H  . fluconazole (DIFLUCAN) IV  200 mg Intravenous Q24H  . insulin aspart  0-9 Units Subcutaneous TID WC  . sodium chloride flush  3 mL Intravenous Q12H  .  verapamil  180 mg Oral QHS   Continuous Infusions: . sodium chloride 75 mL/hr at 04/03/15 2227   PRN Meds:.morphine injection, ondansetron **OR** ondansetron (ZOFRAN) IV   Results for orders placed or performed during the hospital encounter of 04/03/15 (from the past 48 hour(s))  CBC with Differential     Status: Abnormal   Collection Time: 04/03/15  2:35 PM  Result Value Ref Range   WBC 13.3 (H) 4.0 - 10.5 K/uL   RBC 4.02 3.87 - 5.11 MIL/uL   Hemoglobin 11.5 (L) 12.0 - 15.0 g/dL   HCT 35.4 (L) 36.0 - 46.0 %   MCV 88.1 78.0 - 100.0 fL   MCH 28.6 26.0 - 34.0 pg   MCHC 32.5 30.0 - 36.0 g/dL   RDW 14.1 11.5 - 15.5 %   Platelets  350 150 - 400 K/uL   Neutrophils Relative % 83 %   Neutro Abs 10.9 (H) 1.7 - 7.7 K/uL   Lymphocytes Relative 8 %   Lymphs Abs 1.1 0.7 - 4.0 K/uL   Monocytes Relative 9 %   Monocytes Absolute 1.3 (H) 0.1 - 1.0 K/uL   Eosinophils Relative 0 %   Eosinophils Absolute 0.0 0.0 - 0.7 K/uL   Basophils Relative 0 %   Basophils Absolute 0.0 0.0 - 0.1 K/uL  Comprehensive metabolic panel     Status: Abnormal   Collection Time: 04/03/15  2:35 PM  Result Value Ref Range   Sodium 140 135 - 145 mmol/L   Potassium 3.1 (L) 3.5 - 5.1 mmol/L   Chloride 98 (L) 101 - 111 mmol/L   CO2 31 22 - 32 mmol/L   Glucose, Bld 95 65 - 99 mg/dL   BUN 13 6 - 20 mg/dL   Creatinine, Ser 1.11 (H) 0.44 - 1.00 mg/dL   Calcium 9.1 8.9 - 10.3 mg/dL   Total Protein 6.8 6.5 - 8.1 g/dL   Albumin 3.2 (L) 3.5 - 5.0 g/dL   AST 34 15 - 41 U/L   ALT 24 14 - 54 U/L   Alkaline Phosphatase 69 38 - 126 U/L   Total Bilirubin 0.7 0.3 - 1.2 mg/dL   GFR calc non Af Amer 42 (L) >60 mL/min   GFR calc Af Amer 49 (L) >60 mL/min    Comment: (NOTE) The eGFR has been calculated using the CKD EPI equation. This calculation has not been validated in all clinical situations. eGFR's persistently <60 mL/min signify possible Chronic Kidney Disease.    Anion gap 11 5 - 15  Lipase, blood     Status: None    Collection Time: 04/03/15  2:35 PM  Result Value Ref Range   Lipase 25 11 - 51 U/L  Protime-INR     Status: Abnormal   Collection Time: 04/03/15  2:35 PM  Result Value Ref Range   Prothrombin Time 23.1 (H) 11.6 - 15.2 seconds   INR 2.07 (H) 0.00 - 1.49  Urinalysis, Routine w reflex microscopic (not at [facility])     Status: Abnormal   Collection Time: 04/03/15  2:45 PM  Result Value Ref Range   Color, Urine YELLOW YELLOW   APPearance CLEAR CLEAR   Specific Gravity, Urine 1.009 1.005 - 1.030   pH 7.5 5.0 - 8.0   Glucose, UA NEGATIVE NEGATIVE mg/dL   Hgb urine dipstick SMALL (A) NEGATIVE   Bilirubin Urine NEGATIVE NEGATIVE   Ketones, ur NEGATIVE NEGATIVE mg/dL   Protein, ur NEGATIVE NEGATIVE mg/dL   Nitrite NEGATIVE NEGATIVE   Leukocytes, UA NEGATIVE NEGATIVE  Urine microscopic-add on     Status: Abnormal   Collection Time: 04/03/15  2:45 PM  Result Value Ref Range   Squamous Epithelial / LPF 0-5 (A) NONE SEEN   WBC, UA 0-5 0 - 5 WBC/hpf   RBC / HPF 0-5 0 - 5 RBC/hpf   Bacteria, UA RARE (A) NONE SEEN   Casts HYALINE CASTS (A) NEGATIVE  C-reactive protein     Status: Abnormal   Collection Time: 04/03/15 10:10 PM  Result Value Ref Range   CRP 3.5 (H) <1.0 mg/dL  CBC     Status: Abnormal   Collection Time: 04/04/15  5:15 AM  Result Value Ref Range   WBC 16.1 (H) 4.0 - 10.5 K/uL   RBC 3.53 (L) 3.87 - 5.11 MIL/uL   Hemoglobin 10.1 (  L) 12.0 - 15.0 g/dL   HCT 31.5 (L) 36.0 - 46.0 %   MCV 89.2 78.0 - 100.0 fL   MCH 28.6 26.0 - 34.0 pg   MCHC 32.1 30.0 - 36.0 g/dL   RDW 14.2 11.5 - 15.5 %   Platelets 283 150 - 400 K/uL  Basic metabolic panel     Status: Abnormal   Collection Time: 04/04/15  5:15 AM  Result Value Ref Range   Sodium 136 135 - 145 mmol/L   Potassium 3.6 3.5 - 5.1 mmol/L   Chloride 98 (L) 101 - 111 mmol/L   CO2 25 22 - 32 mmol/L   Glucose, Bld 109 (H) 65 - 99 mg/dL   BUN 13 6 - 20 mg/dL   Creatinine, Ser 1.39 (H) 0.44 - 1.00 mg/dL   Calcium 8.6 (L) 8.9 - 10.3  mg/dL   GFR calc non Af Amer 32 (L) >60 mL/min   GFR calc Af Amer 37 (L) >60 mL/min    Comment: (NOTE) The eGFR has been calculated using the CKD EPI equation. This calculation has not been validated in all clinical situations. eGFR's persistently <60 mL/min signify possible Chronic Kidney Disease.    Anion gap 13 5 - 15  Protime-INR     Status: Abnormal   Collection Time: 04/04/15  5:15 AM  Result Value Ref Range   Prothrombin Time 26.9 (H) 11.6 - 15.2 seconds   INR 2.52 (H) 0.00 - 1.49  Glucose, capillary     Status: None   Collection Time: 04/04/15  8:07 AM  Result Value Ref Range   Glucose-Capillary 97 65 - 99 mg/dL  Glucose, capillary     Status: None   Collection Time: 04/04/15 12:16 PM  Result Value Ref Range   Glucose-Capillary 91 65 - 99 mg/dL    Ct Abdomen Pelvis Wo Contrast  04/03/2015  CLINICAL DATA:  Left-sided abdominal pain with nausea. EXAM: CT ABDOMEN AND PELVIS WITHOUT CONTRAST TECHNIQUE: Multidetector CT imaging of the abdomen and pelvis was performed following the standard protocol without IV contrast. COMPARISON:  03/08/2015.  08/06/2005. FINDINGS: Lower chest: Chronic interstitial changes in the lung bases. The heart is enlarged. Pericardial effusion is incompletely visualized and is stable to slightly increased in the interval, remaining small to moderate in size. Hepatobiliary: No focal abnormality in the liver on this study without intravenous contrast. No evidence of hepatomegaly. Gallbladder is surgically absent. No intrahepatic or extrahepatic biliary dilation. Pancreas: No focal mass lesion. No dilatation of the main duct. No intraparenchymal cyst. No peripancreatic edema. Spleen: No splenomegaly. No focal mass lesion. Adrenals/Urinary Tract: No adrenal nodule or mass. 8 mm hyper attenuating lesion in the interpolar right kidney is stable. 13 mm hyper attenuating lesion in the interpolar right kidney is stable. 2.3 cm exophytic water density lesion in the lower  pole the right kidney is unchanged remains compatible with a cyst. Stable appearing cyst in the interpolar left kidney. Apparent exophytic lesion upper pole left kidney is seen to represent normal renal parenchyma with focal cortical scarring on the previous CT scan from 08/06/2005. No evidence for hydroureter. The urinary bladder appears normal for the degree of distention. Stomach/Bowel: Stomach is nondistended. No gastric wall thickening. No evidence of outlet obstruction. Duodenum is normally positioned as is the ligament of Treitz. No small bowel wall thickening. No small bowel dilatation. The terminal ileum is normal. The appendix is normal. Left colonic diverticulosis is evident. Pericolonic edema/ inflammation is seen around the distal descending colon and  proximal sigmoid segment, suggesting acute diverticulitis. This pericolonic edema/inflammation is not substantially different than on the prior study and there is a stable small volume of fluid in the root of the sigmoid mesocolon. There is stable appearance of circumferential wall thickening in the mid sigmoid segment, likely related to muscular hypertrophy from underlying diverticulosis. Vascular/Lymphatic: There is abdominal aortic atherosclerosis without aneurysm. There is no gastrohepatic or hepatoduodenal ligament lymphadenopathy. No intraperitoneal or retroperitoneal lymphadenopathy. Small lymph nodes are identified in the sigmoid mesocolon. Reproductive: Uterus is surgically absent. There is no adnexal mass. Other: No intraperitoneal free fluid. Musculoskeletal: Bone windows reveal no worrisome lytic or sclerotic osseous lesions. IMPRESSION: 1. No substantial interval change in the pericolonic edema/inflammation associated with the distal descending and proximal sigmoid colon. Imaging features are compatible with acute diverticulitis although the lack of interval change may be related to persistent or recurrent disease. No evidence for perforation  or abscess. Given the persistence, underlying neoplastic involvement should be considered. 2. Bilateral renal cysts. High attenuation lesions in the right kidney may be related to proteinaceous or hemorrhagic cyst. Electronically Signed   By: Misty Stanley M.D.   On: 04/03/2015 17:54    Review of Systems  Constitutional: Positive for fever. Negative for chills, weight loss, malaise/fatigue and diaphoresis.  Eyes: Negative for blurred vision, double vision, photophobia, pain, discharge and redness.  Respiratory: Negative for cough, hemoptysis, sputum production, shortness of breath and wheezing.   Cardiovascular: Positive for leg swelling. Negative for chest pain, palpitations, orthopnea, claudication and PND.  Gastrointestinal: Positive for abdominal pain, constipation and blood in stool. Negative for heartburn, nausea, vomiting, diarrhea and melena.  Genitourinary: Negative for dysuria, urgency, frequency, hematuria and flank pain.  Musculoskeletal: Negative for myalgias, back pain, joint pain, falls and neck pain.  Neurological: Negative for dizziness, tingling, tremors, sensory change, speech change, focal weakness, seizures, loss of consciousness and weakness.  Psychiatric/Behavioral: Negative for depression, suicidal ideas, hallucinations, memory loss and substance abuse. The patient is not nervous/anxious and does not have insomnia.    Blood pressure 106/49, pulse 65, temperature 99.2 F (37.3 C), temperature source Oral, resp. rate 18, height '5\' 5"'$  (1.651 m), weight 67.223 kg (148 lb 3.2 oz), SpO2 92 %. Physical Exam  Constitutional: She is oriented to person, place, and time. She appears well-developed and well-nourished. No distress.  HENT:  Mouth/Throat: No oropharyngeal exudate.  Cardiovascular: Normal rate, regular rhythm, normal heart sounds and intact distal pulses.  Exam reveals no gallop and no friction rub.   No murmur heard. Respiratory: Effort normal and breath sounds normal.  No respiratory distress. She has no wheezes. She has no rales. She exhibits no tenderness.  GI: Soft. Bowel sounds are normal. She exhibits no mass.  Focal tenderness to the LLQ without guarding or evidence of peritonitis   Musculoskeletal: Normal range of motion. She exhibits no edema or tenderness.  Neurological: She is alert and oriented to person, place, and time.  Skin: Skin is warm and dry. No rash noted. She is not diaphoretic. No erythema. No pallor.  Psychiatric: She has a normal mood and affect. Her behavior is normal. Judgment and thought content normal.    Assessment/Plan: Acute sigmoid diverticulitis-will change zosyn to Invanz and add an antifungal.  Continue with bowel rest, IV hydration, repeat CBC in AM.  The patient had a colonoscopy in July of 2013 which did not reveal any dysplasia and therefore my suspicion for neoplastic etiology is low.  There are no indications for urgent surgical intervention.  Ideally, would treat with antibiotics IV, then transition to PO and discharge home with a follow up for a colonoscopy in 6-8 weeks.  She understands if she fails medical management she would require a laparotomy, colon resection and a colostomy which would likely be permanent given her age. Thank you for the consult.  surgery will continue to follow along  ID-change zosyn to Invanz D#0 Fluconazole D#0 FEN-NPO CV-CHF, Afib, hold coumadin DM II  Anemia-hx bleeding hemorrhoids s/p rubber band ligation 10/16 and cecal AVMs s/p APC 7/13.    Erby Pian ANP-BC Pager [phone] 04/04/2015, 12:19 PM

## 2015-04-04 NOTE — Progress Notes (Signed)
Notified on call provider in regards to Fever.See MAR for new orders

## 2015-04-04 NOTE — Progress Notes (Signed)
Central Telemetry notified nurse regarding pacer spikes falling within ST Segment. Rate is 70 bpm. Dr Juleen China aware. Will continue to monitor pt frequently. Pt is resting in bed at this time with bed in lowest position and call bell within reach.

## 2015-04-04 NOTE — Progress Notes (Signed)
ANTICOAGULATION CONSULT NOTE - Initial Consult  Pharmacy Consult for Warfarin Indication: atrial fibrillation  Allergies  Allergen Reactions  . Iodinated Diagnostic Agents Hives    HIVES 15MIN S/P IV CONTRAST INJECTION,WILL NEED 13 HR PREP FOR FUTURE INJECTIONS, ok s/p 50mg  po benadryl//a.calhoun  . Anti-Inflammatory Enzyme [Nutritional Supplements]     Retains fluids and headaches  . Arthrotec [Diclofenac-Misoprostol] Other (See Comments)    unknown  . Biaxin [Clarithromycin] Other (See Comments)    unknown  . Plavix [Clopidogrel Bisulfate] Other (See Comments)    unknown  . Spironolactone     Hair loss    Patient Measurements: Height: 5\' 5"  (165.1 cm) Weight: 148 lb 3.2 oz (67.223 kg) IBW/kg (Calculated) : 57 Heparin Dosing Weight:   Vital Signs: Temp: 99.2 F (37.3 C) (03/09 1040) Temp Source: Oral (03/09 1040) BP: 106/49 mmHg (03/09 1040) Pulse Rate: 65 (03/09 1040)  Labs:  Recent Labs  04/03/15 1257 04/03/15 1435 04/04/15 0515  HGB 11.8* 11.5* 10.1*  HCT 34.1* 35.4* 31.5*  PLT  --  350 283  LABPROT  --  23.1* 26.9*  INR  --  2.07* 2.52*  CREATININE  --  1.11* 1.39*    Estimated Creatinine Clearance: 24.2 mL/min (by C-G formula based on Cr of 1.39).   Medical History: Past Medical History  Diagnosis Date  . Pericardial effusion     a. s/p pericardial window 01/16/15  . Atrial fibrillation (HCC)     Chronic  . Chronic anticoagulation     on Coumadin  . History of GI bleed     once treated with Fe infusion  . HTN (hypertension)   . Pacemaker     a. Medtronic, placed for tachybrady.   . Obesity   . LVH (left ventricular hypertrophy)   . Sleep apnea     mild-no cpap  . Positive TB test   . Anemia   . Stroke [facility])     a. 2013: right frontal  . Arthritis   . Tachycardia-bradycardia syndrome [facility])     a. s/p Medtronic Sensia  model number [id number], serial number [id number] H 12/2013  . History of shingles   . Chronic diastolic (congestive) heart  failure ([place])   . Chronic atrial fibrillation (HCC)     a. on coumadin, digoxin and calan  . GI bleed     a. 2/2 AVMs    Medications:  Prescriptions prior to admission  Medication Sig Dispense Refill Last Dose  . Digoxin 62.5 MCG TABS Take 0.0625 mg by mouth daily. Take only 5 days a week; skip Wednesdays and Sundays 30 tablet 6 04/02/2015 at Unknown time  . hydrocortisone-pramoxine (ANALPRAM-HC) 2.5-1 % rectal cream Place 1 application rectally 3 (three) times daily.   04/03/2015 at Unknown time  . polyethylene glycol (MIRALAX / GLYCOLAX) packet Take 17 g by mouth daily.   04/03/2015 at Unknown time  . potassium chloride (K-DUR,KLOR-CON) 10 MEQ tablet Take 20 mEq by mouth daily. Take with Torsemide   04/03/2015 at Unknown time  . torsemide (DEMADEX) 20 MG tablet Take 3 tablets (60 mg total) by mouth daily. 180 tablet 6 04/03/2015 at Unknown time  . verapamil (CALAN-SR) 180 MG CR tablet take 1 tablet by mouth at bedtime 30 tablet 11 04/02/2015 at Unknown time  . warfarin (COUMADIN) 5 MG tablet Take 1 tablet by mouth daily or as directed by coumadin clinic (Patient taking differently: 2.5-5 mg. Monday Wednesday Friday Saturday (2.5 mg ) 5 mg all other days) 90 tablet 1  04/02/2015 at Unknown time  . metolazone (ZAROXOLYN) 2.5 MG tablet TAKE 1 TABLET 30 MINUTES PRIOR TO TORSEMIDE DOSE ON Wednesday AND Saturday. (Patient not taking: Reported on 04/03/2015) 30 tablet 3 Not Taking at Unknown time  . ondansetron (ZOFRAN ODT) 4 MG disintegrating tablet Take 1 tablet (4 mg total) by mouth every 8 (eight) hours as needed for nausea or vomiting. (Patient not taking: Reported on 04/03/2015) 20 tablet 0 Not Taking at Unknown time   Scheduled:  . acetaminophen  650 mg Oral 4 times per day  . digoxin  0.0625 mg Oral Daily  . ertapenem  500 mg Intravenous Q24H  . fluconazole (DIFLUCAN) IV  200 mg Intravenous Q24H  . insulin aspart  0-9 Units Subcutaneous TID WC  . sodium chloride flush  3 mL Intravenous Q12H  . verapamil   180 mg Oral QHS  . warfarin  5 mg Oral ONCE-1800  . Warfarin - Pharmacist Dosing Inpatient   Does not apply q1800   Infusions:  . sodium chloride 75 mL/hr at 04/03/15 2227    Assessment: 82 yo female presenting with abdominal pain  PMH: afib on warfarin, hx of cva, HTN, T2DM, Pacer  AC: warfarin pta for a fib. INR today 2.52  PTA Warfarin Dose: 2.5mg  MWF and 5mg  AODs with last dose 3/7  Renal: SCr 1.39  Heme: H&H 10.1/31.5, Plt 283  Goal of Therapy:  INR 2-3 Monitor platelets by anticoagulation protocol: Yes   Plan:  Warfarin 5 mg x 1  Daily INR, CBC q72h Monitor s/sx of bleeding  Levester Fresh, [name], BCPS, [facility] Clinical Pharmacist Pager [phone] 04/04/2015 12:38 PM

## 2015-04-05 LAB — PROTIME-INR
INR: 4.57 — AB (ref 0.00–1.49)
PROTHROMBIN TIME: 42 s — AB (ref 11.6–15.2)

## 2015-04-05 LAB — CBC
HEMATOCRIT: 33.3 % — AB (ref 36.0–46.0)
Hemoglobin: 10.2 g/dL — ABNORMAL LOW (ref 12.0–15.0)
MCH: 27.3 pg (ref 26.0–34.0)
MCHC: 30.6 g/dL (ref 30.0–36.0)
MCV: 89 fL (ref 78.0–100.0)
PLATELETS: 306 10*3/uL (ref 150–400)
RBC: 3.74 MIL/uL — ABNORMAL LOW (ref 3.87–5.11)
RDW: 14.3 % (ref 11.5–15.5)
WBC: 15 10*3/uL — AB (ref 4.0–10.5)

## 2015-04-05 LAB — GLUCOSE, CAPILLARY
GLUCOSE-CAPILLARY: 118 mg/dL — AB (ref 65–99)
GLUCOSE-CAPILLARY: 95 mg/dL (ref 65–99)
Glucose-Capillary: 111 mg/dL — ABNORMAL HIGH (ref 65–99)
Glucose-Capillary: 100 mg/dL — ABNORMAL HIGH (ref 65–99)

## 2015-04-05 NOTE — Progress Notes (Signed)
ANTICOAGULATION CONSULT NOTE - Initial Consult  Pharmacy Consult for Warfarin Indication: atrial fibrillation   Patient Measurements: Height: 5\' 5"  (165.1 cm) Weight: 149 lb 14.4 oz (67.994 kg) IBW/kg (Calculated) : 57 Heparin Dosing Weight:   Vital Signs: Temp: 99.1 F (37.3 C) (03/10 0615) Temp Source: Oral (03/10 0615) BP: 97/47 mmHg (03/10 0615) Pulse Rate: 63 (03/10 0615)  Labs:  Recent Labs  04/03/15 1435 04/04/15 0515 04/05/15 0553  HGB 11.5* 10.1* 10.2*  HCT 35.4* 31.5* 33.3*  PLT 350 283 306  LABPROT 23.1* 26.9* 42.0*  INR 2.07* 2.52* 4.57*  CREATININE 1.11* 1.39*  --      Medical History: Past Medical History  Diagnosis Date  . Pericardial effusion     a. s/p pericardial window 01/16/15  . Atrial fibrillation (HCC)     Chronic  . Chronic anticoagulation     on Coumadin  . History of GI bleed     once treated with Fe infusion  . HTN (hypertension)   . Pacemaker     a. Medtronic, placed for tachybrady.   . Obesity   . LVH (left ventricular hypertrophy)   . Sleep apnea     mild-no cpap  . Positive TB test   . Anemia   . Stroke [facility])     a. 2013: right frontal  . Arthritis   . Tachycardia-bradycardia syndrome [facility])     a. s/p Medtronic Sensia  model number [id number], serial number [id number] H 12/2013  . History of shingles   . Chronic diastolic (congestive) heart failure ([place])   . Chronic atrial fibrillation (HCC)     a. on coumadin, digoxin and calan  . GI bleed     a. 2/2 AVMs    Assessment: 82 yo female presenting with abdominal pain  PMH: afib on warfarin, hx of cva, HTN, T2DM, Pacer  AC: warfarin pta for a fib. INR today 4.57 < 2.52  PTA Warfarin Dose: 2.5mg  MWF and 5mg  AODs with last dose 3/7  Heme: H&H 10.1/31.5, Plt 283  Goal of Therapy:  INR 2-3 Monitor platelets by anticoagulation protocol: Yes   Plan:  1. INR trended up briskly over last 24 hours; hold warfarin doses for today and likely resume at lower dose (while on  Fluconazole) when INR begins to trend down 2. Daily INR, CBC  Vincenza Hews, [name], BCPS 04/05/2015, 9:00 AM Pager: [phone]

## 2015-04-05 NOTE — Progress Notes (Signed)
Patient ID: [name], female   DOB: August 20, 1923, 82 y.o.   MRN: [id number]     [address] [place], [place] [address]    Phone: [phone] FAX: [phone]     Subjective: INR 4.5 today. WBC down from 16 to 15k.   T max 100.6. Less tender.  Now having loose stools and more frequent.  Objective:  Vital signs:  Filed Vitals:   04/04/15 1040 04/04/15 1438 04/04/15 2328 04/05/15 0615  BP: 106/49 106/49 107/51 97/47  Pulse: 65 72 59 63  Temp: 99.2 F (37.3 C) 99.3 F (37.4 C) 100.6 F (38.1 C) 99.1 F (37.3 C)  TempSrc: Oral Oral Oral Oral  Resp:  '17 18 15  '$ Height:      Weight:    67.994 kg (149 lb 14.4 oz)  SpO2:  93% 91% 92%    Last BM Date: 04/04/15  Intake/Output   Yesterday:    This shift:     Physical Exam: General: Pt awake/alert/oriented x4 in no acute distress Abdomen: Soft.  Nondistended.  Moderate TTP LLQ without guarding.   No evidence of peritonitis.  No incarcerated hernias.    Problem List:   Active Problems:   Diverticulitis   Left sided abdominal pain    Results:   Labs: Results for orders placed or performed during the hospital encounter of 04/03/15 (from the past 48 hour(s))  CBC with Differential     Status: Abnormal   Collection Time: 04/03/15  2:35 PM  Result Value Ref Range   WBC 13.3 (H) 4.0 - 10.5 K/uL   RBC 4.02 3.87 - 5.11 MIL/uL   Hemoglobin 11.5 (L) 12.0 - 15.0 g/dL   HCT 35.4 (L) 36.0 - 46.0 %   MCV 88.1 78.0 - 100.0 fL   MCH 28.6 26.0 - 34.0 pg   MCHC 32.5 30.0 - 36.0 g/dL   RDW 14.1 11.5 - 15.5 %   Platelets 350 150 - 400 K/uL   Neutrophils Relative % 83 %   Neutro Abs 10.9 (H) 1.7 - 7.7 K/uL   Lymphocytes Relative 8 %   Lymphs Abs 1.1 0.7 - 4.0 K/uL   Monocytes Relative 9 %   Monocytes Absolute 1.3 (H) 0.1 - 1.0 K/uL   Eosinophils Relative 0 %   Eosinophils Absolute 0.0 0.0 - 0.7 K/uL   Basophils Relative 0 %   Basophils Absolute 0.0  0.0 - 0.1 K/uL  Comprehensive metabolic panel     Status: Abnormal   Collection Time: 04/03/15  2:35 PM  Result Value Ref Range   Sodium 140 135 - 145 mmol/L   Potassium 3.1 (L) 3.5 - 5.1 mmol/L   Chloride 98 (L) 101 - 111 mmol/L   CO2 31 22 - 32 mmol/L   Glucose, Bld 95 65 - 99 mg/dL   BUN 13 6 - 20 mg/dL   Creatinine, Ser 1.11 (H) 0.44 - 1.00 mg/dL   Calcium 9.1 8.9 - 10.3 mg/dL   Total Protein 6.8 6.5 - 8.1 g/dL   Albumin 3.2 (L) 3.5 - 5.0 g/dL   AST 34 15 - 41 U/L   ALT 24 14 - 54 U/L   Alkaline Phosphatase 69 38 - 126 U/L   Total Bilirubin 0.7 0.3 - 1.2 mg/dL   GFR calc non Af Amer 42 (L) >60 mL/min   GFR calc Af Amer 49 (L) >60 mL/min    Comment: (NOTE) The  eGFR has been calculated using the CKD EPI equation. This calculation has not been validated in all clinical situations. eGFR's persistently <60 mL/min signify possible Chronic Kidney Disease.    Anion gap 11 5 - 15  Lipase, blood     Status: None   Collection Time: 04/03/15  2:35 PM  Result Value Ref Range   Lipase 25 11 - 51 U/L  Protime-INR     Status: Abnormal   Collection Time: 04/03/15  2:35 PM  Result Value Ref Range   Prothrombin Time 23.1 (H) 11.6 - 15.2 seconds   INR 2.07 (H) 0.00 - 1.49  Blood culture (routine x 2)     Status: None (Preliminary result)   Collection Time: 04/03/15  2:35 PM  Result Value Ref Range   Specimen Description BLOOD RIGHT ANTECUBITAL    Special Requests BOTTLES DRAWN AEROBIC AND ANAEROBIC 5CC    Culture NO GROWTH < 24 HOURS    Report Status PENDING   Urinalysis, Routine w reflex microscopic (not at [facility])     Status: Abnormal   Collection Time: 04/03/15  2:45 PM  Result Value Ref Range   Color, Urine YELLOW YELLOW   APPearance CLEAR CLEAR   Specific Gravity, Urine 1.009 1.005 - 1.030   pH 7.5 5.0 - 8.0   Glucose, UA NEGATIVE NEGATIVE mg/dL   Hgb urine dipstick SMALL (A) NEGATIVE   Bilirubin Urine NEGATIVE NEGATIVE   Ketones, ur NEGATIVE NEGATIVE mg/dL   Protein, ur  NEGATIVE NEGATIVE mg/dL   Nitrite NEGATIVE NEGATIVE   Leukocytes, UA NEGATIVE NEGATIVE  Urine culture     Status: None   Collection Time: 04/03/15  2:45 PM  Result Value Ref Range   Specimen Description URINE, RANDOM    Special Requests NONE    Culture NO GROWTH 1 DAY    Report Status 04/04/2015 FINAL   Urine microscopic-add on     Status: Abnormal   Collection Time: 04/03/15  2:45 PM  Result Value Ref Range   Squamous Epithelial / LPF 0-5 (A) NONE SEEN   WBC, UA 0-5 0 - 5 WBC/hpf   RBC / HPF 0-5 0 - 5 RBC/hpf   Bacteria, UA RARE (A) NONE SEEN   Casts HYALINE CASTS (A) NEGATIVE  Blood culture (routine x 2)     Status: None (Preliminary result)   Collection Time: 04/03/15  3:19 PM  Result Value Ref Range   Specimen Description BLOOD RIGHT HAND    Special Requests BOTTLES DRAWN AEROBIC ONLY 5CC    Culture NO GROWTH < 24 HOURS    Report Status PENDING   C-reactive protein     Status: Abnormal   Collection Time: 04/03/15 10:10 PM  Result Value Ref Range   CRP 3.5 (H) <1.0 mg/dL  CBC     Status: Abnormal   Collection Time: 04/04/15  5:15 AM  Result Value Ref Range   WBC 16.1 (H) 4.0 - 10.5 K/uL   RBC 3.53 (L) 3.87 - 5.11 MIL/uL   Hemoglobin 10.1 (L) 12.0 - 15.0 g/dL   HCT 31.5 (L) 36.0 - 46.0 %   MCV 89.2 78.0 - 100.0 fL   MCH 28.6 26.0 - 34.0 pg   MCHC 32.1 30.0 - 36.0 g/dL   RDW 14.2 11.5 - 15.5 %   Platelets 283 150 - 400 K/uL  Basic metabolic panel     Status: Abnormal   Collection Time: 04/04/15  5:15 AM  Result Value Ref Range   Sodium 136 135 -  145 mmol/L   Potassium 3.6 3.5 - 5.1 mmol/L   Chloride 98 (L) 101 - 111 mmol/L   CO2 25 22 - 32 mmol/L   Glucose, Bld 109 (H) 65 - 99 mg/dL   BUN 13 6 - 20 mg/dL   Creatinine, Ser 1.39 (H) 0.44 - 1.00 mg/dL   Calcium 8.6 (L) 8.9 - 10.3 mg/dL   GFR calc non Af Amer 32 (L) >60 mL/min   GFR calc Af Amer 37 (L) >60 mL/min    Comment: (NOTE) The eGFR has been calculated using the CKD EPI equation. This calculation has not  been validated in all clinical situations. eGFR's persistently <60 mL/min signify possible Chronic Kidney Disease.    Anion gap 13 5 - 15  Protime-INR     Status: Abnormal   Collection Time: 04/04/15  5:15 AM  Result Value Ref Range   Prothrombin Time 26.9 (H) 11.6 - 15.2 seconds   INR 2.52 (H) 0.00 - 1.49  Glucose, capillary     Status: None   Collection Time: 04/04/15  8:07 AM  Result Value Ref Range   Glucose-Capillary 97 65 - 99 mg/dL  Glucose, capillary     Status: None   Collection Time: 04/04/15 12:16 PM  Result Value Ref Range   Glucose-Capillary 91 65 - 99 mg/dL  Glucose, capillary     Status: Abnormal   Collection Time: 04/04/15  6:03 PM  Result Value Ref Range   Glucose-Capillary 126 (H) 65 - 99 mg/dL  Glucose, capillary     Status: Abnormal   Collection Time: 04/04/15 11:26 PM  Result Value Ref Range   Glucose-Capillary 109 (H) 65 - 99 mg/dL  Protime-INR     Status: Abnormal   Collection Time: 04/05/15  5:53 AM  Result Value Ref Range   Prothrombin Time 42.0 (H) 11.6 - 15.2 seconds   INR 4.57 (H) 0.00 - 1.49  CBC     Status: Abnormal   Collection Time: 04/05/15  5:53 AM  Result Value Ref Range   WBC 15.0 (H) 4.0 - 10.5 K/uL   RBC 3.74 (L) 3.87 - 5.11 MIL/uL   Hemoglobin 10.2 (L) 12.0 - 15.0 g/dL   HCT 33.3 (L) 36.0 - 46.0 %   MCV 89.0 78.0 - 100.0 fL   MCH 27.3 26.0 - 34.0 pg   MCHC 30.6 30.0 - 36.0 g/dL   RDW 14.3 11.5 - 15.5 %   Platelets 306 150 - 400 K/uL  Glucose, capillary     Status: Abnormal   Collection Time: 04/05/15  7:45 AM  Result Value Ref Range   Glucose-Capillary 111 (H) 65 - 99 mg/dL    Imaging / Studies: Ct Abdomen Pelvis Wo Contrast  04/03/2015  CLINICAL DATA:  Left-sided abdominal pain with nausea. EXAM: CT ABDOMEN AND PELVIS WITHOUT CONTRAST TECHNIQUE: Multidetector CT imaging of the abdomen and pelvis was performed following the standard protocol without IV contrast. COMPARISON:  03/08/2015.  08/06/2005. FINDINGS: Lower chest:  Chronic interstitial changes in the lung bases. The heart is enlarged. Pericardial effusion is incompletely visualized and is stable to slightly increased in the interval, remaining small to moderate in size. Hepatobiliary: No focal abnormality in the liver on this study without intravenous contrast. No evidence of hepatomegaly. Gallbladder is surgically absent. No intrahepatic or extrahepatic biliary dilation. Pancreas: No focal mass lesion. No dilatation of the main duct. No intraparenchymal cyst. No peripancreatic edema. Spleen: No splenomegaly. No focal mass lesion. Adrenals/Urinary Tract: No adrenal nodule or mass.  8 mm hyper attenuating lesion in the interpolar right kidney is stable. 13 mm hyper attenuating lesion in the interpolar right kidney is stable. 2.3 cm exophytic water density lesion in the lower pole the right kidney is unchanged remains compatible with a cyst. Stable appearing cyst in the interpolar left kidney. Apparent exophytic lesion upper pole left kidney is seen to represent normal renal parenchyma with focal cortical scarring on the previous CT scan from 08/06/2005. No evidence for hydroureter. The urinary bladder appears normal for the degree of distention. Stomach/Bowel: Stomach is nondistended. No gastric wall thickening. No evidence of outlet obstruction. Duodenum is normally positioned as is the ligament of Treitz. No small bowel wall thickening. No small bowel dilatation. The terminal ileum is normal. The appendix is normal. Left colonic diverticulosis is evident. Pericolonic edema/ inflammation is seen around the distal descending colon and proximal sigmoid segment, suggesting acute diverticulitis. This pericolonic edema/inflammation is not substantially different than on the prior study and there is a stable small volume of fluid in the root of the sigmoid mesocolon. There is stable appearance of circumferential wall thickening in the mid sigmoid segment, likely related to muscular  hypertrophy from underlying diverticulosis. Vascular/Lymphatic: There is abdominal aortic atherosclerosis without aneurysm. There is no gastrohepatic or hepatoduodenal ligament lymphadenopathy. No intraperitoneal or retroperitoneal lymphadenopathy. Small lymph nodes are identified in the sigmoid mesocolon. Reproductive: Uterus is surgically absent. There is no adnexal mass. Other: No intraperitoneal free fluid. Musculoskeletal: Bone windows reveal no worrisome lytic or sclerotic osseous lesions. IMPRESSION: 1. No substantial interval change in the pericolonic edema/inflammation associated with the distal descending and proximal sigmoid colon. Imaging features are compatible with acute diverticulitis although the lack of interval change may be related to persistent or recurrent disease. No evidence for perforation or abscess. Given the persistence, underlying neoplastic involvement should be considered. 2. Bilateral renal cysts. High attenuation lesions in the right kidney may be related to proteinaceous or hemorrhagic cyst. Electronically Signed   By: Misty Stanley M.D.   On: 04/03/2015 17:54    Medications / Allergies:  Scheduled Meds: . acetaminophen  650 mg Oral 4 times per day  . digoxin  0.0625 mg Oral Daily  . ertapenem  500 mg Intravenous Q24H  . fluconazole (DIFLUCAN) IV  200 mg Intravenous Q24H  . insulin aspart  0-9 Units Subcutaneous TID WC  . sodium chloride flush  3 mL Intravenous Q12H  . verapamil  180 mg Oral QHS  . Warfarin - Pharmacist Dosing Inpatient   Does not apply q1800   Continuous Infusions: . sodium chloride 75 mL/hr at 04/03/15 2227   PRN Meds:.morphine injection, ondansetron **OR** ondansetron (ZOFRAN) IV  Antibiotics: Anti-infectives    Start     Dose/Rate Route Frequency Ordered Stop   04/04/15 1300  ertapenem (INVANZ) 0.5 g in sodium chloride 0.9 % 50 mL IVPB     500 mg 100 mL/hr over 30 Minutes Intravenous Every 24 hours 04/04/15 1218     04/04/15 1300   fluconazole (DIFLUCAN) IVPB 200 mg     200 mg 100 mL/hr over 60 Minutes Intravenous Every 24 hours 04/04/15 1218     04/03/15 2100  piperacillin-tazobactam (ZOSYN) IVPB 3.375 g  Status:  Discontinued     3.375 g 12.5 mL/hr over 240 Minutes Intravenous Every 8 hours 04/03/15 2042 04/04/15 1218   04/03/15 1500  piperacillin-tazobactam (ZOSYN) IVPB 3.375 g     3.375 g 100 mL/hr over 30 Minutes Intravenous  Once 04/03/15 1448 04/03/15 1640  Assessment/Plan Acute sigmoid diverticulitis-mild improvement today.  Continue with non operative management.   OP colonoscopy 6-8 weeks  ID- Invanz D#1 Fluconazole D#1 FEN-NPO CV-CHF, Afib, pacemaker,HTN, hold coumadin in case she fails medical management which would require a hartman's resection.  INR 4.5.  DM II  Anemia-hx bleeding hemorrhoids s/p rubber band ligation 10/16 and cecal AVMs s/p APC 7/13.    Erby Pian, [facility] Pager [phone]) For consults and floor pages call [phone])  04/05/2015 8:25 AM

## 2015-04-05 NOTE — Progress Notes (Signed)
Family Medicine Teaching Service Daily Progress Note Intern Pager: [phone]  Patient name: [name] Medical record number: [id number] Date of birth: Oct 20, 1923 Age: 82 y.o. Gender: female  Primary Care Provider: [name], MD Consultants: Surgery Code Status: Full  Pt Overview and Major Events to Date:  Admitted 04/03/15  Assessment and Plan: [name] is a 82 y.o. female presenting with abdominal pain. PMH is significant for atrial fibrillation, hx of CVA, hypertension, type 2 diabetes, tachycardia-bradycardia syndrome s/p pacemaker, prior pericardial effusion s/p pericardial window 12/16, sleep apnea (no CPAP), GI bleeding with hemorrhoids, and arthritis. Pain is likely due to recurrent vs. unresolved diverticulitis. However, CT, exam and family history concerning for colonic mass.   #Left Lower Quadrant Abdominal pain: Worsening left lower quadrant pain with fevers and poor PO. Concern was for perforated or abscess from diverticuilits which patient has been treated for over the last month. Per PCP note patient was seen by GI (Dr. Watt Climes) as outpatient. Patient with hx of AVMs cauterized in 2013 during colonoscopy, also hx of hemorrhoids. Patient's sister had colon cancer, and her daughter had an APR. Stable vitals. Leukocytosis to 16.1, improved to 15.0 today. CT with evidence of left colonic diverticulitis with pericolonic edema, consistent with acute inflammation but unchanged from last month's imaging. Cannot rule out cancerous process. Lower quadrant pain differential includes acute cystitis versus acute colitis versus diverticulitis. Exam concerning for guarding and distension but CT abdomen reassuring against acute abdomen. Prior to admission, patient had been tolerating food but in decreased amounts. She continues to have fevers. - Switched antibiotics from zosyn to ertapenem 04/04/15 with diflucan, as recommended by surgery. Would hold off on quick transition to oral  antibiotics since patient has failed 2 different oral regimens as outpatient  - CRP elevated at 3.5 compared to 0.6 at last admission - Urine culture pending (NG x 1 day) - Blood culture pending (NG x 2 days) - Obtain daily CBCs and BMETs - Advance diet to clears and then as tolerated - trend fevers - Strict I's and O's - IVFs while NPO - Morphine 2 mg q4h for pain - Patient refuses tylenol (hurts her liver) - Consulted general surgery for concern for need for resection. Appreciate recommendations. Surgery unlikely will be needed unless patient's symptoms do not improve. In addition, patient is not interested in surgery at this time. Could consider colonoscopy with biopsy once recovered to rule out cancer.   #Anemia: hgb 10.1, down from 11.5 on admission (baseline 11.5). Long-term history of rectal bleeding. With history of hemorrhoids. - Monitor with am CBCs - FOBT negative in ED (performed by this examiner) - Monitor for GI bleed  # Atrial fibrillation: ECHO EF 0000000, no diastolic dysfunction. - Continue Verapamil and Digoxin  - Continue Coumadin per pharmacy (FOBT negative and hgb at baseline) - INR 2.07 on admission, 4.57 today  # Type 2 diabetes mellitus: A1C 6.2  - SSI - Continue ACQHS  #Lower extremity edema: Patient says she usually wears compression hose. Documented history of CHF but ECHO in January 2017 w/ normal EF and no diastolic dysfunction, some moderate valvular disease. Has stasis dermatitis of lower legs. No signs of overload on today's exam.  - Hold home Torsemide and metolazone while providing IVF; restart if signs of overload - Stict I/Os - Daily weights  FEN/GI: NS @100  mL/hr while NPO Prophylaxis: SCDs (on warfarin)   Disposition: Admitted on telemetry, home pending clinical improvement  Subjective:  Patient says abdominal pain is  improved but still present with palpation and worse when she has a bowel movement. She says she does not have much of an  appetite today. She says her most recent bowel movement was liquid-y and light; had only had apple juice.   Objective: Temp:  [99.1 F (37.3 C)-100.6 F (38.1 C)] 99.1 F (37.3 C) (03/10 0615) Pulse Rate:  [59-72] 63 (03/10 0615) Resp:  [15-18] 15 (03/10 0615) BP: (97-107)/(47-51) 97/47 mmHg (03/10 0615) SpO2:  [91 %-93 %] 92 % (03/10 0615) Weight:  [149 lb 14.4 oz (67.994 kg)] 149 lb 14.4 oz (67.994 kg) (03/10 0615) Physical Exam: General: Well-appearing female, resting in bed Cardiovascular: 2/6 holosytolic murmur best heard over right upper sternal border, RRR Chest: Lungs CTAB. No increased WOB. Pacemaker present over right upper chest.  Abdomen: +BS, diffuse tenderness with guarding over lower abdomen and sharply tender over LLQ Extremities: WWP, moves all spontaneously, no LE edema  Laboratory:  Recent Labs Lab 04/03/15 1435 04/04/15 0515 04/05/15 0553  WBC 13.3* 16.1* 15.0*  HGB 11.5* 10.1* 10.2*  HCT 35.4* 31.5* 33.3*  PLT 350 283 306    Recent Labs Lab 04/03/15 1435 04/04/15 0515  NA 140 136  K 3.1* 3.6  CL 98* 98*  CO2 31 25  BUN 13 13  CREATININE 1.11* 1.39*  CALCIUM 9.1 8.6*  PROT 6.8  --   BILITOT 0.7  --   ALKPHOS 69  --   ALT 24  --   AST 34  --   GLUCOSE 95 109*    Imaging/Diagnostic Tests: Ct Abdomen Pelvis Wo Contrast  04/03/2015  CLINICAL DATA:  Left-sided abdominal pain with nausea. EXAM: CT ABDOMEN AND PELVIS WITHOUT CONTRAST TECHNIQUE: Multidetector CT imaging of the abdomen and pelvis was performed following the standard protocol without IV contrast. COMPARISON:  03/08/2015.  08/06/2005. FINDINGS: Lower chest: Chronic interstitial changes in the lung bases. The heart is enlarged. Pericardial effusion is incompletely visualized and is stable to slightly increased in the interval, remaining small to moderate in size. Hepatobiliary: No focal abnormality in the liver on this study without intravenous contrast. No evidence of hepatomegaly.  Gallbladder is surgically absent. No intrahepatic or extrahepatic biliary dilation. Pancreas: No focal mass lesion. No dilatation of the main duct. No intraparenchymal cyst. No peripancreatic edema. Spleen: No splenomegaly. No focal mass lesion. Adrenals/Urinary Tract: No adrenal nodule or mass. 8 mm hyper attenuating lesion in the interpolar right kidney is stable. 13 mm hyper attenuating lesion in the interpolar right kidney is stable. 2.3 cm exophytic water density lesion in the lower pole the right kidney is unchanged remains compatible with a cyst. Stable appearing cyst in the interpolar left kidney. Apparent exophytic lesion upper pole left kidney is seen to represent normal renal parenchyma with focal cortical scarring on the previous CT scan from 08/06/2005. No evidence for hydroureter. The urinary bladder appears normal for the degree of distention. Stomach/Bowel: Stomach is nondistended. No gastric wall thickening. No evidence of outlet obstruction. Duodenum is normally positioned as is the ligament of Treitz. No small bowel wall thickening. No small bowel dilatation. The terminal ileum is normal. The appendix is normal. Left colonic diverticulosis is evident. Pericolonic edema/ inflammation is seen around the distal descending colon and proximal sigmoid segment, suggesting acute diverticulitis. This pericolonic edema/inflammation is not substantially different than on the prior study and there is a stable small volume of fluid in the root of the sigmoid mesocolon. There is stable appearance of circumferential wall thickening in the mid  sigmoid segment, likely related to muscular hypertrophy from underlying diverticulosis. Vascular/Lymphatic: There is abdominal aortic atherosclerosis without aneurysm. There is no gastrohepatic or hepatoduodenal ligament lymphadenopathy. No intraperitoneal or retroperitoneal lymphadenopathy. Small lymph nodes are identified in the sigmoid mesocolon. Reproductive: Uterus is  surgically absent. There is no adnexal mass. Other: No intraperitoneal free fluid. Musculoskeletal: Bone windows reveal no worrisome lytic or sclerotic osseous lesions. IMPRESSION: 1. No substantial interval change in the pericolonic edema/inflammation associated with the distal descending and proximal sigmoid colon. Imaging features are compatible with acute diverticulitis although the lack of interval change may be related to persistent or recurrent disease. No evidence for perforation or abscess. Given the persistence, underlying neoplastic involvement should be considered. 2. Bilateral renal cysts. High attenuation lesions in the right kidney may be related to proteinaceous or hemorrhagic cyst. Electronically Signed   By: Misty Stanley M.D.   On: 04/03/2015 17:54   Ct Abdomen Pelvis Wo Contrast  03/08/2015  CLINICAL DATA:  Abdominal mass left lower quadrant found on physical examination with abdominal pain for several months EXAM: CT ABDOMEN AND PELVIS WITHOUT CONTRAST TECHNIQUE: Multidetector CT imaging of the abdomen and pelvis was performed following the standard protocol without IV contrast. COMPARISON:  06/26/2011 FINDINGS: Lower chest: In large pericardial effusion is again identified. 3 mm pulmonary nodule image number 7 right lung base. This is stable from 2013 and therefore benign. Hepatobiliary: Status post cholecystectomy Pancreas: Negative Spleen: Negative Adrenals/Urinary Tract: Multiple bilateral renal cysts of varying size and attenuation similar to prior study. Mm low attenuation cysts lateral inter pole are left kidney represents change from prior study and is not fully evaluated without contrast but shows average attenuation of 0 consistent with cysts. No hydronephrosis. Bladder normal. Stomach/Bowel: Stomach and small bowel are normal. Appendix is normal. There is extensive diverticulosis beginning at the junction of the descending and sigmoid colon and continuing through out the proximal  half of the sigmoid colon. No oral contrast is seen within the sigmoid colon, and in the absence of oral and IV contrast evaluation is limited. There is clearly severe wall thickening involving the proximal half of the sigmoid colon with surrounding inflammatory change. There is a small volume of free fluid in the left pelvis in this area. Mild wall thickening continues into the distal sigmoid colon. Vascular/Lymphatic: Extensive atherosclerotic aortoiliac calcification. Numerous small nonpathologic mesenteric lymph nodes. Reproductive: No significant abnormalities Other: No evidence of abscess. Small volume free fluid left pelvis related to the sigmoid colon inflammatory process. Musculoskeletal: Diffuse osteopenia.  No acute findings. IMPRESSION: Evidence of diverticulitis involving the sigmoid colon. Electronically Signed   By: Skipper Cliche M.D.   On: 03/08/2015 10:56    Alexas Basulto Corinda Gubler, MD 04/05/2015, 7:31 AM PGY-1, [place] Intern pager: [phone], text pages welcome

## 2015-04-06 DIAGNOSIS — I319 Disease of pericardium, unspecified: Secondary | ICD-10-CM

## 2015-04-06 DIAGNOSIS — Z950 Presence of cardiac pacemaker: Secondary | ICD-10-CM

## 2015-04-06 DIAGNOSIS — I480 Paroxysmal atrial fibrillation: Secondary | ICD-10-CM

## 2015-04-06 DIAGNOSIS — I10 Essential (primary) hypertension: Secondary | ICD-10-CM

## 2015-04-06 DIAGNOSIS — R791 Abnormal coagulation profile: Secondary | ICD-10-CM

## 2015-04-06 DIAGNOSIS — I495 Sick sinus syndrome: Secondary | ICD-10-CM

## 2015-04-06 LAB — BASIC METABOLIC PANEL
Anion gap: 13 (ref 5–15)
BUN: 21 mg/dL — AB (ref 6–20)
CO2: 27 mmol/L (ref 22–32)
CREATININE: 1.3 mg/dL — AB (ref 0.44–1.00)
Calcium: 9 mg/dL (ref 8.9–10.3)
Chloride: 100 mmol/L — ABNORMAL LOW (ref 101–111)
GFR calc Af Amer: 41 mL/min — ABNORMAL LOW (ref >60)
GFR, EST NON AFRICAN AMERICAN: 35 mL/min — AB (ref >60)
GLUCOSE: 93 mg/dL (ref 65–99)
POTASSIUM: 4.1 mmol/L (ref 3.5–5.1)
Sodium: 140 mmol/L (ref 135–145)

## 2015-04-06 LAB — CBC
HCT: 32.3 % — ABNORMAL LOW (ref 36.0–46.0)
Hemoglobin: 10.5 g/dL — ABNORMAL LOW (ref 12.0–15.0)
MCH: 29 pg (ref 26.0–34.0)
MCHC: 32.5 g/dL (ref 30.0–36.0)
MCV: 89.2 fL (ref 78.0–100.0)
PLATELETS: 333 10*3/uL (ref 150–400)
RBC: 3.62 MIL/uL — AB (ref 3.87–5.11)
RDW: 14.2 % (ref 11.5–15.5)
WBC: 14.9 10*3/uL — ABNORMAL HIGH (ref 4.0–10.5)

## 2015-04-06 LAB — PROTIME-INR
INR: 7.54 (ref 0.00–1.49)
Prothrombin Time: 61.2 seconds — ABNORMAL HIGH (ref 11.6–15.2)

## 2015-04-06 LAB — GLUCOSE, CAPILLARY
GLUCOSE-CAPILLARY: 94 mg/dL (ref 65–99)
Glucose-Capillary: 99 mg/dL (ref 65–99)
Glucose-Capillary: 110 mg/dL — ABNORMAL HIGH (ref 65–99)
Glucose-Capillary: 102 mg/dL — ABNORMAL HIGH (ref 65–99)

## 2015-04-06 MED ORDER — PHYTONADIONE 5 MG PO TABS
2.5000 mg | ORAL_TABLET | Freq: Once | ORAL | Status: AC
  Administered 2015-04-06: 2.5 mg via ORAL
  Filled 2015-04-06: qty 1

## 2015-04-06 NOTE — Progress Notes (Signed)
Family Medicine Teaching Service Daily Progress Note Intern Pager: [phone]  Patient name: [name] Medical record number: [id number] Date of birth: [birth year] Age: 82 y.o. Gender: female  Primary Care Provider: [name], MD Consultants: Surgery Code Status: Full  Pt Overview and Major Events to Date:  Admitted 04/03/15: started on Zosyn 3/9: switched to Ertapenem and Diflucan per surgery   Assessment and Plan: [name] is a 82 y.o. female presenting with abdominal pain. PMH is significant for atrial fibrillation, hx of CVA, hypertension, type 2 diabetes, tachycardia-bradycardia syndrome s/p pacemaker, prior pericardial effusion s/p pericardial window 12/16, sleep apnea (no CPAP), GI bleeding with hemorrhoids, and arthritis. Pain is likely due to recurrent vs. unresolved diverticulitis. However, CT, exam and family history concerning for colonic mass.   #Left Lower Quadrant Abdominal pain, overall improving: Worsening left lower quadrant pain with fevers and poor PO. Concern was for perforated or abscess from diverticuilits which patient has been treated for over the last month. Per PCP note patient was seen by GI (Dr. Watt Climes) as outpatient. Patient with hx of AVMs cauterized in 2013 during colonoscopy, also hx of hemorrhoids. Patient's sister had colon cancer, and her daughter had an APR. Stable vitals. Leukocytosis to 16.1, improved to 14.9 today. CT with evidence of left colonic diverticulitis with pericolonic edema, consistent with acute inflammation but unchanged from last month's imaging. Cannot rule out cancerous process. Lower quadrant pain differential includes acute cystitis versus acute colitis versus diverticulitis. Exam concerning for guarding and distension but CT abdomen reassuring against acute abdomen. Afebrile 24 hours.  - Switched antibiotics from zosyn to ertapenem 04/04/15 with diflucan, as recommended by surgery. Would hold off on quick transition to oral  antibiotics since patient has failed 2 different oral regimens as outpatient  - CRP elevated at 3.5 compared to 0.6 at last admission - Urine culture final (NG x 1 day) - Blood culture pending (NG x 2 days) - Obtain daily CBCs and BMETs - diet: is tolerating clear fluids only had 300cc starting this AM; likely continue clears today and consider advancing tomorrow - trend fevers - Strict I's and O's - Morphine 2 mg q4h for pain - Patient refuses tylenol (hurts her liver) - Consulted general surgery for concern for need for resection. Appreciate recommendations. No significant improvement on symptoms on abx. Continue abx, if pt fails to improve, need to discuss surgical options.  In addition, patient is not interested in surgery at this time. Could consider colonoscopy with biopsy once recovered to rule out cancer.    #Anemia: hgb 10.1, down from 11.5 on admission (baseline 11.5). Long-term history of rectal bleeding. With history of hemorrhoids. Stable at 10.5  - Monitor with am CBCs - FOBT negative in ED (performed by this examiner) - Monitor for GI bleed  # Atrial fibrillation: ECHO EF 0000000, no diastolic dysfunction. - Continue Verapamil and Digoxin  - coumadin currently on hold due to elevated INR 7.54  # Elevated INR: Increased from 4.57 to 7.54  - Vitamin K 2.5g  # Type 2 diabetes mellitus: A1C 6.2  - SSI - Continue ACQHS  #Lower extremity edema: Patient says she usually wears compression hose. Documented history of CHF but ECHO in January 2017 w/ normal EF and no diastolic dysfunction, some moderate valvular disease. Has stasis dermatitis of lower legs. Pitting edema to mid shin bilaterally, no JVD or crackles on lung exam.  - Hold home Torsemide and metolazone while providing IVF; blood pressures are stable to low  normal - Stict I/Os: only occurrences noted  (changed order to strict I/O) - Daily weights: 152lb today (up 4 lb from admission)  FEN/GI: NS @75  mL/hr   Prophylaxis: (Elevated INR-autotherapeutic currently)   Disposition: Admitted on telemetry, home pending clinical improvement  Subjective:  Pain has overall improved, but still present in her LLQ and mid lower abdomen. Has had a little clears and has been tolerating this. No other concerns.   Objective: Temp:  [97.7 F (36.5 C)-98.5 F (36.9 C)] 97.7 F (36.5 C) (03/11 0554) Pulse Rate:  [69-86] 69 (03/11 0554) Resp:  [16-18] 16 (03/11 0554) BP: (107-121)/(50-59) 110/55 mmHg (03/11 0554) SpO2:  [93 %-95 %] 93 % (03/11 0554) Weight:  [152 lb 9.6 oz (69.219 kg)] 152 lb 9.6 oz (69.219 kg) (03/11 0709) Physical Exam: General: Well-appearing female, resting in bed Cardiovascular: 2/6 holosytolic murmur best heard over right upper sternal border, RRR; no JVD Chest: Lungs CTAB. No increased WOB. Pacemaker present over right upper chest.  Abdomen: +BS, tenderness to palpation with guarding over LLW and suprapubic region, no rebound  Extremities: WWP, moves all spontaneously, pitting edema to upper ankle bilaterally .   Laboratory:  Recent Labs Lab 04/04/15 0515 04/05/15 0553 04/06/15 0651  WBC 16.1* 15.0* 14.9*  HGB 10.1* 10.2* 10.5*  HCT 31.5* 33.3* 32.3*  PLT 283 306 333    Recent Labs Lab 04/03/15 1435 04/04/15 0515 04/06/15 0651  NA 140 136 140  K 3.1* 3.6 4.1  CL 98* 98* 100*  CO2 31 25 27   BUN 13 13 21*  CREATININE 1.11* 1.39* 1.30*  CALCIUM 9.1 8.6* 9.0  PROT 6.8  --   --   BILITOT 0.7  --   --   ALKPHOS 69  --   --   ALT 24  --   --   AST 34  --   --   GLUCOSE 95 109* 93    Imaging/Diagnostic Tests: Ct Abdomen Pelvis Wo Contrast  04/03/2015  CLINICAL DATA:  Left-sided abdominal pain with nausea. EXAM: CT ABDOMEN AND PELVIS WITHOUT CONTRAST TECHNIQUE: Multidetector CT imaging of the abdomen and pelvis was performed following the standard protocol without IV contrast. COMPARISON:  03/08/2015.  08/06/2005. FINDINGS: Lower chest: Chronic interstitial changes in  the lung bases. The heart is enlarged. Pericardial effusion is incompletely visualized and is stable to slightly increased in the interval, remaining small to moderate in size. Hepatobiliary: No focal abnormality in the liver on this study without intravenous contrast. No evidence of hepatomegaly. Gallbladder is surgically absent. No intrahepatic or extrahepatic biliary dilation. Pancreas: No focal mass lesion. No dilatation of the main duct. No intraparenchymal cyst. No peripancreatic edema. Spleen: No splenomegaly. No focal mass lesion. Adrenals/Urinary Tract: No adrenal nodule or mass. 8 mm hyper attenuating lesion in the interpolar right kidney is stable. 13 mm hyper attenuating lesion in the interpolar right kidney is stable. 2.3 cm exophytic water density lesion in the lower pole the right kidney is unchanged remains compatible with a cyst. Stable appearing cyst in the interpolar left kidney. Apparent exophytic lesion upper pole left kidney is seen to represent normal renal parenchyma with focal cortical scarring on the previous CT scan from 08/06/2005. No evidence for hydroureter. The urinary bladder appears normal for the degree of distention. Stomach/Bowel: Stomach is nondistended. No gastric wall thickening. No evidence of outlet obstruction. Duodenum is normally positioned as is the ligament of Treitz. No small bowel wall thickening. No small bowel dilatation. The terminal ileum is normal.  The appendix is normal. Left colonic diverticulosis is evident. Pericolonic edema/ inflammation is seen around the distal descending colon and proximal sigmoid segment, suggesting acute diverticulitis. This pericolonic edema/inflammation is not substantially different than on the prior study and there is a stable small volume of fluid in the root of the sigmoid mesocolon. There is stable appearance of circumferential wall thickening in the mid sigmoid segment, likely related to muscular hypertrophy from underlying  diverticulosis. Vascular/Lymphatic: There is abdominal aortic atherosclerosis without aneurysm. There is no gastrohepatic or hepatoduodenal ligament lymphadenopathy. No intraperitoneal or retroperitoneal lymphadenopathy. Small lymph nodes are identified in the sigmoid mesocolon. Reproductive: Uterus is surgically absent. There is no adnexal mass. Other: No intraperitoneal free fluid. Musculoskeletal: Bone windows reveal no worrisome lytic or sclerotic osseous lesions. IMPRESSION: 1. No substantial interval change in the pericolonic edema/inflammation associated with the distal descending and proximal sigmoid colon. Imaging features are compatible with acute diverticulitis although the lack of interval change may be related to persistent or recurrent disease. No evidence for perforation or abscess. Given the persistence, underlying neoplastic involvement should be considered. 2. Bilateral renal cysts. High attenuation lesions in the right kidney may be related to proteinaceous or hemorrhagic cyst. Electronically Signed   By: Misty Stanley M.D.   On: 04/03/2015 17:54   Ct Abdomen Pelvis Wo Contrast  03/08/2015  CLINICAL DATA:  Abdominal mass left lower quadrant found on physical examination with abdominal pain for several months EXAM: CT ABDOMEN AND PELVIS WITHOUT CONTRAST TECHNIQUE: Multidetector CT imaging of the abdomen and pelvis was performed following the standard protocol without IV contrast. COMPARISON:  06/26/2011 FINDINGS: Lower chest: In large pericardial effusion is again identified. 3 mm pulmonary nodule image number 7 right lung base. This is stable from 2013 and therefore benign. Hepatobiliary: Status post cholecystectomy Pancreas: Negative Spleen: Negative Adrenals/Urinary Tract: Multiple bilateral renal cysts of varying size and attenuation similar to prior study. Mm low attenuation cysts lateral inter pole are left kidney represents change from prior study and is not fully evaluated without  contrast but shows average attenuation of 0 consistent with cysts. No hydronephrosis. Bladder normal. Stomach/Bowel: Stomach and small bowel are normal. Appendix is normal. There is extensive diverticulosis beginning at the junction of the descending and sigmoid colon and continuing through out the proximal half of the sigmoid colon. No oral contrast is seen within the sigmoid colon, and in the absence of oral and IV contrast evaluation is limited. There is clearly severe wall thickening involving the proximal half of the sigmoid colon with surrounding inflammatory change. There is a small volume of free fluid in the left pelvis in this area. Mild wall thickening continues into the distal sigmoid colon. Vascular/Lymphatic: Extensive atherosclerotic aortoiliac calcification. Numerous small nonpathologic mesenteric lymph nodes. Reproductive: No significant abnormalities Other: No evidence of abscess. Small volume free fluid left pelvis related to the sigmoid colon inflammatory process. Musculoskeletal: Diffuse osteopenia.  No acute findings. IMPRESSION: Evidence of diverticulitis involving the sigmoid colon. Electronically Signed   By: Skipper Cliche M.D.   On: 03/08/2015 10:56    Smiley Houseman, MD 04/06/2015, 9:16 AM PGY-1, [place] Intern pager: [phone], text pages welcome

## 2015-04-06 NOTE — Progress Notes (Signed)
Requesting provider: Smiley Houseman, MD Primary cardiologist: Dr. Sanda Klein  Reason for consultation: History of atrial fibrillation with Medtronic pacemaker secondary to tachycardia-bradycardia syndrome  Clinical Summary [name] is a 82 y.o.female presently admitted to the hospital with abdominal pain associated with fevers and leukocytosis, diagnosed with sigmoid diverticulitis and being managed at this time with broad-spectrum antibiotics. She has been evaluated by the surgical team with no plans to operate as yet. We are asked to follow secondary to history of atrial fibrillation and Medtronic pacemaker with history of tachycardia-bradycardia syndrome. She follows with Dr. Sallyanne Kuster and was seen recently in the office in February with normal device interrogation at that time, 64% paced rhythm, and 36% ventricular sensed rhythm with chronic atrial fibrillation documented, no high ventricular rates.  She is on telemetry which shows a ventricular paced rhythm with probable underlying atrial fibrillation as before. There is significant lead artifact but no other significant arrhythmias.  She has had a supratherapeutic INR, most recently 7.5, and Coumadin is currently on hold. Heart rate control regimen includes verapamil and digoxin.   Allergies  Allergen Reactions  . Iodinated Diagnostic Agents Hives    HIVES 15MIN S/P IV CONTRAST INJECTION,WILL NEED 13 HR PREP FOR FUTURE INJECTIONS, ok s/p 50mg  po benadryl//a.calhoun  . Anti-Inflammatory Enzyme [Nutritional Supplements]     Retains fluids and headaches  . Arthrotec [Diclofenac-Misoprostol] Other (See Comments)    unknown  . Biaxin [Clarithromycin] Other (See Comments)    unknown  . Plavix [Clopidogrel Bisulfate] Other (See Comments)    unknown  . Spironolactone     Hair loss    Medications Scheduled Medications: . acetaminophen  650 mg Oral 4 times per day  . digoxin  0.0625 mg Oral Daily  . ertapenem  500 mg  Intravenous Q24H  . fluconazole (DIFLUCAN) IV  200 mg Intravenous Q24H  . insulin aspart  0-9 Units Subcutaneous TID WC  . sodium chloride flush  3 mL Intravenous Q12H  . verapamil  180 mg Oral QHS    Infusions: . sodium chloride 75 mL/hr at 04/06/15 0658    PRN Medications: morphine injection, ondansetron **OR** ondansetron (ZOFRAN) IV   Past Medical History  Diagnosis Date  . Pericardial effusion     a. s/p pericardial window 01/16/15  . Atrial fibrillation (HCC)     Chronic  . Chronic anticoagulation     on Coumadin  . History of GI bleed     once treated with Fe infusion  . HTN (hypertension)   . Pacemaker     a. Medtronic, placed for tachybrady.   . Obesity   . LVH (left ventricular hypertrophy)   . Sleep apnea     mild-no cpap  . Positive TB test   . Anemia   . Stroke [facility])     a. 2013: right frontal  . Arthritis   . Tachycardia-bradycardia syndrome [facility])     a. s/p Medtronic Sensia  model number [id number], serial number [id number] H 12/2013  . History of shingles   . Chronic diastolic (congestive) heart failure ([place])   . Chronic atrial fibrillation (HCC)     a. on coumadin, digoxin and calan  . GI bleed     a. 2/2 AVMs    Past Surgical History  Procedure Laterality Date  . Breast lumpectomy Bilateral     negative for cancer  . Esophagogastroduodenoscopy  05/19/2011    Procedure: ESOPHAGOGASTRODUODENOSCOPY (EGD);  Surgeon: Lear Ng, MD;  Location: [facility] ENDOSCOPY;  Service: Endoscopy;  Laterality: N/A;  doctor aware of inr   will try to be here no later than 230  . Cholecystectomy  2005  . Esophagogastroduodenoscopy  06/22/2011    Procedure: ESOPHAGOGASTRODUODENOSCOPY (EGD);  Surgeon: Arta Silence, MD;  Location: [facility] ENDOSCOPY;  Service: Endoscopy;  Laterality: N/A;  Check PT/INR in am  . Givens capsule study  06/23/2011    Procedure: GIVENS CAPSULE STUDY;  Surgeon: Arta Silence, MD;  Location: [facility] ENDOSCOPY;  Service: Endoscopy;  Laterality: N/A;  .  Colonoscopy  08/11/2011    Procedure: COLONOSCOPY;  Surgeon: Jeryl Columbia, MD;  Location: [facility] ENDOSCOPY;  Service: Endoscopy;  Laterality: N/A;  . Hot hemostasis  08/11/2011    Procedure: HOT HEMOSTASIS (ARGON PLASMA COAGULATION/BICAP);  Surgeon: Jeryl Columbia, MD;  Location: [facility] ENDOSCOPY;  Service: Endoscopy;  Laterality: N/A;  . Mass excision Left 09/14/2013    Procedure: EXCISION MASS LEFT WRIST;  Surgeon: Leanora Cover, MD;  Location: [place];  Service: Orthopedics;  Laterality: Left;  . Tonsillectomy and adenoidectomy  1950  . Insert / replace / remove pacemaker  2001    Last generator in 2006; interrogated Dec 2012  . Cataract extraction w/ intraocular lens  implant, bilateral Bilateral   . Lipoma excision Left 07/2013    wrist  . Pacemaker generator change N/A 01/02/2014    Procedure: PACEMAKER GENERATOR CHANGE;  Surgeon: Sanda Klein, MD;  Location: [place] CATH LAB;  Service: Cardiovascular;  Laterality: N/A;  . Lead revision N/A 01/02/2014    Procedure: LEAD REVISION;  Surgeon: Sanda Klein, MD;  Location: [place] CATH LAB;  Service: Cardiovascular;  Laterality: N/A;  . Subxyphoid pericardial window N/A 01/16/2015    Procedure: SUBXYPHOID PERICARDIAL WINDOW;  Surgeon: Grace Isaac, MD;  Location: [place];  Service: Thoracic;  Laterality: N/A;  . Tee without cardioversion N/A 01/16/2015    Procedure: TRANSESOPHAGEAL ECHOCARDIOGRAM (TEE);  Surgeon: Grace Isaac, MD;  Location: [facility] OR;  Service: Thoracic;  Laterality: N/A;    Family History  Problem Relation Age of Onset  . Stroke Mother   . Stroke Father   . Pneumonia Father   . Colon cancer Sister   . Colon cancer Daughter     Social History [name] reports that she has never smoked. She has never used smokeless tobacco. [name] reports that she drinks alcohol.  Review of Systems Complete review of systems negative except as otherwise outlined in the clinical summary and also the following. Mild  intermittent abdominal discomfort and general malaise. No chest pain or palpitations at present.  Physical Examination Blood pressure 103/46, pulse 135, temperature 98.3 F (36.8 C), temperature source Oral, resp. rate 19, height 5\' 5"  (1.651 m), weight 152 lb 9.6 oz (69.219 kg), SpO2 95 %.  Intake/Output Summary (Last 24 hours) at 04/06/15 1435 Last data filed at 04/06/15 1428  Gross per 24 hour  Intake   1364 ml  Output      0 ml  Net   1364 ml   Gen.: Pleasant elderly woman in no distress. HEENT: Conjunctiva and lids normal, oropharynx clear. Neck: Supple, mildly elevated JVP, no carotid bruits, no thyromegaly. Lungs: Decreased breath sounds at the bases, nonlabored breathing at rest. Cardiac: Regular rate and rhythm, no S3, soft systolic murmur, no pericardial rub. Abdomen: Soft, nontender, bowel sounds diminished, no guarding or rebound. Extremities: No pitting edema, distal pulses 2+. Skin: Warm and dry. Musculoskeletal: Mild kyphosis. Neuropsychiatric: Alert and oriented x3, affect grossly appropriate.  Lab Results  Basic Metabolic Panel:  Recent Labs Lab 04/03/15 1435 04/04/15 0515 04/06/15 0651  NA 140 136 140  K 3.1* 3.6 4.1  CL 98* 98* 100*  CO2 31 25 27   GLUCOSE 95 109* 93  BUN 13 13 21*  CREATININE 1.11* 1.39* 1.30*  CALCIUM 9.1 8.6* 9.0    Liver Function Tests:  Recent Labs Lab 04/03/15 1435  AST 34  ALT 24  ALKPHOS 69  BILITOT 0.7  PROT 6.8  ALBUMIN 3.2*    CBC:  Recent Labs Lab 04/03/15 1257 04/03/15 1435 04/04/15 0515 04/05/15 0553 04/06/15 0651  WBC 13.3* 13.3* 16.1* 15.0* 14.9*  NEUTROABS  --  10.9*  --   --   --   HGB 11.8* 11.5* 10.1* 10.2* 10.5*  HCT 34.1* 35.4* 31.5* 33.3* 32.3*  MCV 84.6 88.1 89.2 89.0 89.2  PLT  --  350 283 306 333    ECG Prior tracing from February 2017 showed a ventricular paced rhythm with underlying atrial fibrillation. More recent ration this shows what appears to be an accelerated junctional  rhythm with diffuse nonspecific ST-T changes.  Imaging Echocardiogram 1/20 07/16/2015: Study Conclusions  - Left ventricle: The cavity size was normal. Wall thickness was  increased in a pattern of mild LVH. Systolic function was normal.  The estimated ejection fraction was in the range of 55% to 60%.  Wall motion was normal; there were no regional wall motion  abnormalities. - Aortic valve: There was mild to moderate regurgitation. Valve  area (VTI): 2.37 cm^2. Valve area (Vmax): 2.29 cm^2. Valve area  (Vmean): 2.33 cm^2. - Mitral valve: Mildly to moderately calcified annulus. Mildly  thickened leaflets . There was mild regurgitation. - Left atrium: The atrium was moderately dilated. - Right ventricle: The cavity size was mildly dilated. - Right atrium: The atrium was moderately dilated. - Pulmonary arteries: Systolic pressure was mildly increased. PA  peak pressure: 38 mm Hg (S). - Pericardium, extracardiac: A small pericardial effusion was  identified circumferential to the heart.  Impression  1. History of chronic atrial fibrillation with tachycardia-bradycardia syndrome status post Medtronic pacemaker placement. She is followed by Dr. Sallyanne Kuster and had normal device function documented last month at office visit. Telemetry currently shows a ventricular paced rhythm. Recent ECG shows what looks to be a accelerated junctional rhythm with diffuse nonspecific ST-T changes. At present Coumadin is on hold with supratherapeutic INR of 7.5.  2. History of pericardial window in December 2016 with small residual pericardial effusion by recent follow-up echocardiogram.  3. High thromboembolic risk score with CHADSVASC score of 6.  4. Sigmoid diverticulitis, being managed with broad spectrum antibiotics and followed by the surgical team. No specific plan for operation as yet however.   Recommendations  Would recommend continuing telemetry to observe rhythm. Check digoxin level  to ensure no evidence of even low-level toxicity. May eventually be best to stop the digoxin altogether and use verapamil for heart rate control alone. Follow INR carefully. Need to consider bridging with heparin when INR decreases under 2.0 to reduce risk of stroke given high thromboembolic risk score. See office note from Dr. Sallyanne Kuster in February with similar recommendation.  Satira Sark, M.D., F.A.C.C.

## 2015-04-06 NOTE — Progress Notes (Signed)
Subjective: LLQ pain is mild.  Objective: Vital signs in last 24 hours: Temp:  [97.7 F (36.5 C)-98.5 F (36.9 C)] 97.7 F (36.5 C) (03/11 0554) Pulse Rate:  [69-86] 69 (03/11 0554) Resp:  [16-18] 16 (03/11 0554) BP: (107-121)/(50-59) 110/55 mmHg (03/11 0554) SpO2:  [93 %-95 %] 93 % (03/11 0554) Weight:  [69.219 kg (152 lb 9.6 oz)] 69.219 kg (152 lb 9.6 oz) (03/11 0709) Last BM Date: 04/04/15  Intake/Output from previous day: 03/10 0701 - 03/11 0700 In: 864 [I.V.:864] Out: -  Intake/Output this shift: Total I/O In: 300 [P.O.:300] Out: -   PE: General- In NAD Abdomen-soft, LLQ tenderness and guarding  Lab Results:   Recent Labs  04/05/15 0553 04/06/15 0651  WBC 15.0* 14.9*  HGB 10.2* 10.5*  HCT 33.3* 32.3*  PLT 306 333   BMET  Recent Labs  04/04/15 0515 04/06/15 0651  NA 136 140  K 3.6 4.1  CL 98* 100*  CO2 25 27  GLUCOSE 109* 93  BUN 13 21*  CREATININE 1.39* 1.30*  CALCIUM 8.6* 9.0   PT/INR  Recent Labs  04/05/15 0553 04/06/15 0651  LABPROT 42.0* 61.2*  INR 4.57* 7.54*   Comprehensive Metabolic Panel:    Component Value Date/Time   NA 140 04/06/2015 0651   NA 136 04/04/2015 0515   K 4.1 04/06/2015 0651   K 3.6 04/04/2015 0515   CL 100* 04/06/2015 0651   CL 98* 04/04/2015 0515   CO2 27 04/06/2015 0651   CO2 25 04/04/2015 0515   BUN 21* 04/06/2015 0651   BUN 13 04/04/2015 0515   CREATININE 1.30* 04/06/2015 0651   CREATININE 1.39* 04/04/2015 0515   CREATININE 1.03* 02/22/2015 1123   CREATININE 1.09* 02/01/2015 1442   GLUCOSE 93 04/06/2015 0651   GLUCOSE 109* 04/04/2015 0515   CALCIUM 9.0 04/06/2015 0651   CALCIUM 8.6* 04/04/2015 0515   AST 34 04/03/2015 1435   AST 30 03/08/2015 1515   ALT 24 04/03/2015 1435   ALT 22 03/08/2015 1515   ALKPHOS 69 04/03/2015 1435   ALKPHOS 83 03/08/2015 1515   BILITOT 0.7 04/03/2015 1435   BILITOT 0.5 03/08/2015 1515   PROT 6.8 04/03/2015 1435   PROT 6.5 03/08/2015 1515   ALBUMIN 3.2*  04/03/2015 1435   ALBUMIN 3.0* 03/08/2015 1515     Studies/Results: No results found.  Anti-infectives: Anti-infectives    Start     Dose/Rate Route Frequency Ordered Stop   04/04/15 1300  ertapenem (INVANZ) 0.5 g in sodium chloride 0.9 % 50 mL IVPB     500 mg 100 mL/hr over 30 Minutes Intravenous Every 24 hours 04/04/15 1218     04/04/15 1300  fluconazole (DIFLUCAN) IVPB 200 mg     200 mg 100 mL/hr over 60 Minutes Intravenous Every 24 hours 04/04/15 1218     04/03/15 2100  piperacillin-tazobactam (ZOSYN) IVPB 3.375 g  Status:  Discontinued     3.375 g 12.5 mL/hr over 240 Minutes Intravenous Every 8 hours 04/03/15 2042 04/04/15 1218   04/03/15 1500  piperacillin-tazobactam (ZOSYN) IVPB 3.375 g     3.375 g 100 mL/hr over 30 Minutes Intravenous  Once 04/03/15 1448 04/03/15 1640      Assessment Acute, persistent sigmoid diverticulitis-persistent leukocytosis, no fever, on InVanz and Diflucan-no deterioration but no significant improvement    LOS: 2 days   Plan: Continue current antibiotic regimen for now.  If she fails to improve, would need to discuss surgical options with her and her family  [name] 04/06/2015

## 2015-04-06 NOTE — Progress Notes (Signed)
CRITICAL VALUE ALERT  Critical value received:  INR  Date of notification: 04/06/15  Time of notification:  [id number]  Critical value read back:Yes.    Nurse who received alert:  Precious Gilding RN  MD notified (1st page):  Teaching Services/ Dr. Dallas Schimke  Time of first page:  10:30am  Responding MD:  Dr. Dallas Schimke  Time MD responded:  [phone]

## 2015-04-07 DIAGNOSIS — E0865 Diabetes mellitus due to underlying condition with hyperglycemia: Secondary | ICD-10-CM

## 2015-04-07 LAB — BASIC METABOLIC PANEL
ANION GAP: 11 (ref 5–15)
BUN: 18 mg/dL (ref 6–20)
CHLORIDE: 103 mmol/L (ref 101–111)
CO2: 26 mmol/L (ref 22–32)
Calcium: 8.9 mg/dL (ref 8.9–10.3)
Creatinine, Ser: 1.07 mg/dL — ABNORMAL HIGH (ref 0.44–1.00)
GFR calc Af Amer: 51 mL/min — ABNORMAL LOW (ref >60)
GFR, EST NON AFRICAN AMERICAN: 44 mL/min — AB (ref >60)
GLUCOSE: 93 mg/dL (ref 65–99)
POTASSIUM: 3.5 mmol/L (ref 3.5–5.1)
Sodium: 140 mmol/L (ref 135–145)

## 2015-04-07 LAB — CBC
HEMATOCRIT: 33.8 % — AB (ref 36.0–46.0)
HEMOGLOBIN: 10.4 g/dL — AB (ref 12.0–15.0)
MCH: 27.2 pg (ref 26.0–34.0)
MCHC: 30.8 g/dL (ref 30.0–36.0)
MCV: 88.3 fL (ref 78.0–100.0)
Platelets: 342 10*3/uL (ref 150–400)
RBC: 3.83 MIL/uL — AB (ref 3.87–5.11)
RDW: 14.2 % (ref 11.5–15.5)
WBC: 10.8 10*3/uL — AB (ref 4.0–10.5)

## 2015-04-07 LAB — GLUCOSE, CAPILLARY
GLUCOSE-CAPILLARY: 86 mg/dL (ref 65–99)
Glucose-Capillary: 80 mg/dL (ref 65–99)
Glucose-Capillary: 113 mg/dL — ABNORMAL HIGH (ref 65–99)
Glucose-Capillary: 112 mg/dL — ABNORMAL HIGH (ref 65–99)

## 2015-04-07 LAB — DIGOXIN LEVEL: DIGOXIN LVL: 0.6 ng/mL — AB (ref 0.8–2.0)

## 2015-04-07 LAB — PROTIME-INR
INR: 2.96 — ABNORMAL HIGH (ref 0.00–1.49)
INR: 2.43 — AB (ref 0.00–1.49)
PROTHROMBIN TIME: 26.1 s — AB (ref 11.6–15.2)
Prothrombin Time: 30.3 seconds — ABNORMAL HIGH (ref 11.6–15.2)

## 2015-04-07 MED ORDER — LACTINEX PO CHEW
1.0000 | CHEWABLE_TABLET | Freq: Three times a day (TID) | ORAL | Status: DC
  Administered 2015-04-08 – 2015-04-11 (×9): 1 via ORAL
  Filled 2015-04-07 (×12): qty 1

## 2015-04-07 MED ORDER — ERTAPENEM SODIUM 1 G IJ SOLR
1.0000 g | INTRAMUSCULAR | Status: DC
  Administered 2015-04-08 – 2015-04-09 (×2): 1 g via INTRAVENOUS
  Filled 2015-04-07 (×3): qty 1

## 2015-04-07 MED ORDER — WARFARIN - PHARMACIST DOSING INPATIENT
Freq: Every day | Status: DC
  Administered 2015-04-07 – 2015-04-08 (×2)

## 2015-04-07 MED ORDER — WARFARIN SODIUM 2 MG PO TABS
2.0000 mg | ORAL_TABLET | Freq: Once | ORAL | Status: AC
  Administered 2015-04-07: 2 mg via ORAL
  Filled 2015-04-07: qty 1

## 2015-04-07 NOTE — Progress Notes (Signed)
  Subjective: LLQ pain is much better.  Family in room  Objective: Vital signs in last 24 hours: Temp:  [97.8 F (36.6 C)-98.3 F (36.8 C)] 97.8 F (36.6 C) (03/12 0618) Pulse Rate:  [60-135] 77 (03/12 0906) Resp:  [16-19] 16 (03/12 0618) BP: (103-118)/(46-63) 111/50 mmHg (03/12 0618) SpO2:  [90 %-99 %] 90 % (03/12 0618) Weight:  [69.99 kg (154 lb 4.8 oz)] 69.99 kg (154 lb 4.8 oz) (03/12 0436) Last BM Date: 04/06/15  Intake/Output from previous day: 03/11 0701 - 03/12 0700 In: 2633.8 [P.O.:900; I.V.:1733.8] Out: -  Intake/Output this shift:    PE: General- In NAD Abdomen-soft, decreased LLQ tenderness and no guarding today  Lab Results:   Recent Labs  04/06/15 0651 04/07/15 0733  WBC 14.9* 10.8*  HGB 10.5* 10.4*  HCT 32.3* 33.8*  PLT 333 342   BMET  Recent Labs  04/06/15 0651 04/07/15 0733  NA 140 140  K 4.1 3.5  CL 100* 103  CO2 27 26  GLUCOSE 93 93  BUN 21* 18  CREATININE 1.30* 1.07*  CALCIUM 9.0 8.9   PT/INR  Recent Labs  04/06/15 0651 04/07/15 0733  LABPROT 61.2* 30.3*  INR 7.54* 2.96*   Comprehensive Metabolic Panel:    Component Value Date/Time   NA 140 04/07/2015 0733   NA 140 04/06/2015 0651   K 3.5 04/07/2015 0733   K 4.1 04/06/2015 0651   CL 103 04/07/2015 0733   CL 100* 04/06/2015 0651   CO2 26 04/07/2015 0733   CO2 27 04/06/2015 0651   BUN 18 04/07/2015 0733   BUN 21* 04/06/2015 0651   CREATININE 1.07* 04/07/2015 0733   CREATININE 1.30* 04/06/2015 0651   CREATININE 1.03* 02/22/2015 1123   CREATININE 1.09* 02/01/2015 1442   GLUCOSE 93 04/07/2015 0733   GLUCOSE 93 04/06/2015 0651   CALCIUM 8.9 04/07/2015 0733   CALCIUM 9.0 04/06/2015 0651   AST 34 04/03/2015 1435   AST 30 03/08/2015 1515   ALT 24 04/03/2015 1435   ALT 22 03/08/2015 1515   ALKPHOS 69 04/03/2015 1435   ALKPHOS 83 03/08/2015 1515   BILITOT 0.7 04/03/2015 1435   BILITOT 0.5 03/08/2015 1515   PROT 6.8 04/03/2015 1435   PROT 6.5 03/08/2015 1515   ALBUMIN 3.2* 04/03/2015 1435   ALBUMIN 3.0* 03/08/2015 1515     Studies/Results: No results found.  Anti-infectives: Anti-infectives    Start     Dose/Rate Route Frequency Ordered Stop   04/04/15 1300  ertapenem (INVANZ) 0.5 g in sodium chloride 0.9 % 50 mL IVPB     500 mg 100 mL/hr over 30 Minutes Intravenous Every 24 hours 04/04/15 1218     04/04/15 1300  fluconazole (DIFLUCAN) IVPB 200 mg     200 mg 100 mL/hr over 60 Minutes Intravenous Every 24 hours 04/04/15 1218     04/03/15 2100  piperacillin-tazobactam (ZOSYN) IVPB 3.375 g  Status:  Discontinued     3.375 g 12.5 mL/hr over 240 Minutes Intravenous Every 8 hours 04/03/15 2042 04/04/15 1218   04/03/15 1500  piperacillin-tazobactam (ZOSYN) IVPB 3.375 g     3.375 g 100 mL/hr over 30 Minutes Intravenous  Once 04/03/15 1448 04/03/15 1640      Assessment Acute, persistent sigmoid diverticulitis-persistent leukocytosis, no fever, on InVanz and Diflucan-has improved the past 24 hours   LOS: 3 days   Plan: Continue current antibiotic regimen.  Advance to full liquid diet.  Runell Kovich J 04/07/2015

## 2015-04-07 NOTE — Progress Notes (Signed)
ANTICOAGULATION CONSULT NOTE - Initial Consult Pharmacy Consult for Warfarin Indication: atrial fibrillation   Patient Measurements: Height: 5\' 5"  (165.1 cm) Weight: 154 lb 4.8 oz (69.99 kg) IBW/kg (Calculated) : 57  Vital Signs: Temp: 98 F (36.7 C) (03/12 1347) Temp Source: Oral (03/12 0618) BP: 123/52 mmHg (03/12 1347) Pulse Rate: 61 (03/12 1347)  Labs:  Recent Labs  04/05/15 0553 04/06/15 0651 04/07/15 0733  HGB 10.2* 10.5* 10.4*  HCT 33.3* 32.3* 33.8*  PLT 306 333 342  LABPROT 42.0* 61.2* 30.3*  INR 4.57* 7.54* 2.96*  CREATININE  --  1.30* 1.07*    Assessment: 82 yo female presenting with abdominal pain. Warfarin initially started on 3/8 but stopped on 3/10 after rapid increase in INR. Warfarin held and subsequently received vitamin K 2.5 mg PO on 3/11. INR 2.96 today. CBC stable with no reported sxs of bleeding. Remains on Ertapenem and Fluconazole for acute sigmoid diverticulitis. Dosing and INR as below.  Date 3/8 3/9 3/10 3/11 3/12 3/13  INR 2.07 2.52 4.57 7.54 2.96   Warfarin dose 2.5 mg  5 mg HELD HELD 2.5 mg PO VitK 2 mg   * fluconazole started 03/9*  PTA dose: 5 mg on Sun/Tue/Thu and 2.5 mg AOD's per clinic notes  Goal of Therapy:  INR 2-3 Monitor platelets by anticoagulation protocol: Yes   Plan:  1. Resume warfarin at dose of 2 mg x 1 tonight. Likely to require lower dose while on fluconazole 2. Daily INR, CBC  Vincenza Hews, [name], BCPS 04/07/2015, 5:07 PM Pager: [phone]

## 2015-04-07 NOTE — Progress Notes (Signed)
Family Medicine Teaching Service Daily Progress Note Intern Pager: [phone]  Patient name: [name] Medical record number: [id number] Date of birth: 25-Dec-1923 Age: 82 y.o. Gender: female  Primary Care Provider: [name], MD Consultants: Surgery Code Status: Full  Pt Overview and Major Events to Date:  Admitted 04/03/15: started on Zosyn 3/9: switched to Ertapenem and Diflucan per surgery   Assessment and Plan: CURTINA ATLAS is a 82 y.o. female presenting with abdominal pain. PMH is significant for atrial fibrillation, hx of CVA, hypertension, type 2 diabetes, tachycardia-bradycardia syndrome s/p pacemaker, prior pericardial effusion s/p pericardial window 12/16, sleep apnea (no CPAP), GI bleeding with hemorrhoids, and arthritis. Pain is likely due to recurrent vs. unresolved diverticulitis. However, CT, exam and family history concerning for colonic mass.   #Left Lower Quadrant Abdominal pain, overall improving: Worsening left lower quadrant pain with fevers and poor PO. Concern was for perforated or abscess from diverticuilits which patient has been treated for over the last month. Per PCP note patient was seen by GI (Dr. Watt Climes) as outpatient. Patient with hx of AVMs cauterized in 2013 during colonoscopy, also hx of hemorrhoids. Patient's sister had colon cancer, and her daughter had an APR. Stable vitals. Leukocytosis to 16.1, improved to 10.8 today. CT with evidence of left colonic diverticulitis with pericolonic edema, consistent with acute inflammation but unchanged from last month's imaging. Cannot rule out cancerous process. Lower quadrant pain differential includes acute cystitis versus acute colitis versus diverticulitis. Exam concerning for guarding and distension but CT abdomen reassuring against acute abdomen. Afebrile 48 hours.  - Switched antibiotics from zosyn to ertapenem 04/04/15 with diflucan, as recommended by surgery. Would hold off on quick transition to oral  antibiotics since patient has failed 2 different oral regimens as outpatient  - CRP elevated at 3.5 compared to 0.6 at last admission - Urine culture final (NG x 1 day) - Blood culture pending (NG x 4 days) - Obtain daily CBCs and BMETs - diet: advancing as tolerating - trend fevers - Strict I's and O's - Morphine 2 mg q4h for pain - Patient refuses tylenol (hurts her liver) - Consulted general surgery for concern for need for resection. Appreciate recommendations. No significant improvement on symptoms on abx. Continue abx, if pt fails to improve, need to discuss surgical options.  In addition, patient is not interested in surgery at this time. Could consider colonoscopy with biopsy once recovered to rule out cancer.   - PT eval   #Anemia: hgb 11.5 on admission (baseline 11.5). Long-term history of rectal bleeding. With history of hemorrhoids. Stable at 10.8  - Monitor with am CBCs - FOBT negative in ED  - Monitor for GI bleed  # Atrial fibrillation: ECHO EF 0000000, no diastolic dysfunction. - Continue Verapamil and Digoxin  - bridging with heparin per pharm when INR > 2 (recommended by cards)  # Elevated INR: Resovled. 2.9 (3/12) - Vitamin K 2.5g  # Type 2 diabetes mellitus: A1C 6.2  - SSI - Continue ACQHS  #Lower extremity edema: Patient says she usually wears compression hose. Documented history of CHF but ECHO in January 2017 w/ normal EF and no diastolic dysfunction, some moderate valvular disease. Has stasis dermatitis of lower legs. Pitting edema to mid shin bilaterally, no JVD or crackles on lung exam.  - Hold home Torsemide and metolazone  blood pressures are stable to low normal - Stict I/Os: only occurrences noted  (changed order to strict I/O) - Daily weights: 154lb today (up  6 lb from admission) - KVO fluids 3/12  FEN/GI: NS KVO; Full liquid Prophylaxis: (Elevated INR-autotherapeutic currently)   Disposition: Admitted on telemetry, home pending clinical  improvement  Subjective:  Pain improving. Tolerating liquids well. No other concerns.   Objective: Temp:  [97.8 F (36.6 C)-98.3 F (36.8 C)] 97.8 F (36.6 C) (03/12 0618) Pulse Rate:  [71-135] 77 (03/12 0906) Resp:  [16-19] 16 (03/12 0618) BP: (103-118)/(46-63) 111/50 mmHg (03/12 0618) SpO2:  [90 %-99 %] 90 % (03/12 0618) Weight:  [154 lb 4.8 oz (69.99 kg)] 154 lb 4.8 oz (69.99 kg) (03/12 0436) Physical Exam: General: Well-appearing female, resting in bed Cardiovascular: 2/6 holosytolic murmur best heard over right upper sternal border, RRR; no JVD Chest: Lungs CTAB. No increased WOB. Pacemaker present over right upper chest.  Abdomen: +BS, tenderness to palpation with guarding over LLW and suprapubic region, no rebound  Extremities: WWP, moves all spontaneously, pitting edema to upper ankle bilaterally .   Laboratory:  Recent Labs Lab 04/05/15 0553 04/06/15 0651 04/07/15 0733  WBC 15.0* 14.9* 10.8*  HGB 10.2* 10.5* 10.4*  HCT 33.3* 32.3* 33.8*  PLT 306 333 342    Recent Labs Lab 04/03/15 1435 04/04/15 0515 04/06/15 0651 04/07/15 0733  NA 140 136 140 140  K 3.1* 3.6 4.1 3.5  CL 98* 98* 100* 103  CO2 31 25 27 26   BUN 13 13 21* 18  CREATININE 1.11* 1.39* 1.30* 1.07*  CALCIUM 9.1 8.6* 9.0 8.9  PROT 6.8  --   --   --   BILITOT 0.7  --   --   --   ALKPHOS 69  --   --   --   ALT 24  --   --   --   AST 34  --   --   --   GLUCOSE 95 109* 93 93    Imaging/Diagnostic Tests: Ct Abdomen Pelvis Wo Contrast  04/03/2015  CLINICAL DATA:  Left-sided abdominal pain with nausea. EXAM: CT ABDOMEN AND PELVIS WITHOUT CONTRAST TECHNIQUE: Multidetector CT imaging of the abdomen and pelvis was performed following the standard protocol without IV contrast. COMPARISON:  03/08/2015.  08/06/2005. FINDINGS: Lower chest: Chronic interstitial changes in the lung bases. The heart is enlarged. Pericardial effusion is incompletely visualized and is stable to slightly increased in the interval,  remaining small to moderate in size. Hepatobiliary: No focal abnormality in the liver on this study without intravenous contrast. No evidence of hepatomegaly. Gallbladder is surgically absent. No intrahepatic or extrahepatic biliary dilation. Pancreas: No focal mass lesion. No dilatation of the main duct. No intraparenchymal cyst. No peripancreatic edema. Spleen: No splenomegaly. No focal mass lesion. Adrenals/Urinary Tract: No adrenal nodule or mass. 8 mm hyper attenuating lesion in the interpolar right kidney is stable. 13 mm hyper attenuating lesion in the interpolar right kidney is stable. 2.3 cm exophytic water density lesion in the lower pole the right kidney is unchanged remains compatible with a cyst. Stable appearing cyst in the interpolar left kidney. Apparent exophytic lesion upper pole left kidney is seen to represent normal renal parenchyma with focal cortical scarring on the previous CT scan from 08/06/2005. No evidence for hydroureter. The urinary bladder appears normal for the degree of distention. Stomach/Bowel: Stomach is nondistended. No gastric wall thickening. No evidence of outlet obstruction. Duodenum is normally positioned as is the ligament of Treitz. No small bowel wall thickening. No small bowel dilatation. The terminal ileum is normal. The appendix is normal. Left colonic diverticulosis is evident.  Pericolonic edema/ inflammation is seen around the distal descending colon and proximal sigmoid segment, suggesting acute diverticulitis. This pericolonic edema/inflammation is not substantially different than on the prior study and there is a stable small volume of fluid in the root of the sigmoid mesocolon. There is stable appearance of circumferential wall thickening in the mid sigmoid segment, likely related to muscular hypertrophy from underlying diverticulosis. Vascular/Lymphatic: There is abdominal aortic atherosclerosis without aneurysm. There is no gastrohepatic or hepatoduodenal  ligament lymphadenopathy. No intraperitoneal or retroperitoneal lymphadenopathy. Small lymph nodes are identified in the sigmoid mesocolon. Reproductive: Uterus is surgically absent. There is no adnexal mass. Other: No intraperitoneal free fluid. Musculoskeletal: Bone windows reveal no worrisome lytic or sclerotic osseous lesions. IMPRESSION: 1. No substantial interval change in the pericolonic edema/inflammation associated with the distal descending and proximal sigmoid colon. Imaging features are compatible with acute diverticulitis although the lack of interval change may be related to persistent or recurrent disease. No evidence for perforation or abscess. Given the persistence, underlying neoplastic involvement should be considered. 2. Bilateral renal cysts. High attenuation lesions in the right kidney may be related to proteinaceous or hemorrhagic cyst. Electronically Signed   By: Misty Stanley M.D.   On: 04/03/2015 17:54    Olam Idler, MD 04/07/2015, 10:52 AM PGY-3, [place] Intern pager: [phone], text pages welcome

## 2015-04-07 NOTE — Progress Notes (Signed)
Primary cardiologist: Dr. Sanda Klein  Seen for followup: Atrial fibrillation, Medtronic pacemaker  Subjective:    Feels better today in terms of abdominal discomfort. No chest pain or palpitations.  Objective:   Temp:  [97.8 F (36.6 C)-98.3 F (36.8 C)] 97.8 F (36.6 C) (03/12 0618) Pulse Rate:  [60-135] 77 (03/12 0906) Resp:  [16-19] 16 (03/12 0618) BP: (103-118)/(46-63) 111/50 mmHg (03/12 0618) SpO2:  [90 %-99 %] 90 % (03/12 0618) Weight:  [154 lb 4.8 oz (69.99 kg)] 154 lb 4.8 oz (69.99 kg) (03/12 0436) Last BM Date: 04/06/15  Filed Weights   04/05/15 0615 04/06/15 0709 04/07/15 0436  Weight: 149 lb 14.4 oz (67.994 kg) 152 lb 9.6 oz (69.219 kg) 154 lb 4.8 oz (69.99 kg)    Intake/Output Summary (Last 24 hours) at 04/07/15 0954 Last data filed at 04/07/15 0542  Gross per 24 hour  Intake 2633.75 ml  Output      0 ml  Net 2633.75 ml    Telemetry: Ventricular pacing with occasional intrinsic conduction.  Exam:  General: Elderly woman, appears comfortable at rest.  Lungs: No wheezing, nonlabored.  Cardiac: Largely RRR, soft systolic murmur.  Abdomen: NABS.  Extremities: No pitting edema.  Lab Results:  Basic Metabolic Panel:  Recent Labs Lab 04/04/15 0515 04/06/15 0651 04/07/15 0733  NA 136 140 140  K 3.6 4.1 3.5  CL 98* 100* 103  CO2 25 27 26   GLUCOSE 109* 93 93  BUN 13 21* 18  CREATININE 1.39* 1.30* 1.07*  CALCIUM 8.6* 9.0 8.9    Liver Function Tests:  Recent Labs Lab 04/03/15 1435  AST 34  ALT 24  ALKPHOS 69  BILITOT 0.7  PROT 6.8  ALBUMIN 3.2*    CBC:  Recent Labs Lab 04/05/15 0553 04/06/15 0651 04/07/15 0733  WBC 15.0* 14.9* 10.8*  HGB 10.2* 10.5* 10.4*  HCT 33.3* 32.3* 33.8*  MCV 89.0 89.2 88.3  PLT 306 333 342    Coagulation:  Recent Labs Lab 04/05/15 0553 04/06/15 0651 04/07/15 0733  INR 4.57* 7.54* 2.96*    Echocardiogram 02/21/2015: Study Conclusions  - Left ventricle: The cavity size was  normal. Wall thickness was  increased in a pattern of mild LVH. Systolic function was normal.  The estimated ejection fraction was in the range of 55% to 60%.  Wall motion was normal; there were no regional wall motion  abnormalities. - Aortic valve: There was mild to moderate regurgitation. Valve  area (VTI): 2.37 cm^2. Valve area (Vmax): 2.29 cm^2. Valve area  (Vmean): 2.33 cm^2. - Mitral valve: Mildly to moderately calcified annulus. Mildly  thickened leaflets . There was mild regurgitation. - Left atrium: The atrium was moderately dilated. - Right ventricle: The cavity size was mildly dilated. - Right atrium: The atrium was moderately dilated. - Pulmonary arteries: Systolic pressure was mildly increased. PA  peak pressure: 38 mm Hg (S). - Pericardium, extracardiac: A small pericardial effusion was  identified circumferential to the heart.   Medications:   Scheduled Medications: . acetaminophen  650 mg Oral 4 times per day  . digoxin  0.0625 mg Oral Daily  . ertapenem  500 mg Intravenous Q24H  . fluconazole (DIFLUCAN) IV  200 mg Intravenous Q24H  . insulin aspart  0-9 Units Subcutaneous TID WC  . sodium chloride flush  3 mL Intravenous Q12H  . verapamil  180 mg Oral QHS     Infusions: . sodium chloride 75 mL/hr at 04/06/15 2151     PRN  Medications:  morphine injection, ondansetron **OR** ondansetron (ZOFRAN) IV   Assessment:   1. Chronic atrial fibrillation with tachycardia-bradycardia syndrome status post Medtronic pacemaker placement. She is followed by Dr. Sallyanne Kuster and had normal device function documented last month at office visit. Telemetry shows ventricular paced rhythm with occasional intrinsic conduction. Recent ECG shows what looks to be a accelerated junctional rhythm with diffuse nonspecific ST-T changes. At present Coumadin is on hold with INR down to 2.9.  2. History of pericardial window in December 2016 with small residual pericardial effusion  by recent follow-up echocardiogram.  3. High thromboembolic risk score with CHADSVASC score of 6.  4. Sigmoid diverticulitis, being managed with broad spectrum antibiotics and followed by the surgical team. No specific plan for operation as yet however. Symptomatically improving.  Plan/Discussion:    Digoxin level is reasonable. No change to current heart rate control regimen. Need to consider bridging with heparin when INR decreases under 2.0 to reduce risk of stroke given high thromboembolic risk score.   Satira Sark, M.D., F.A.C.C.

## 2015-04-07 NOTE — Care Management Note (Signed)
Case Management Note  Patient Details  Name: AVELYN BLUNCK MRN: [id number] Date of Birth: [birth year]  Subjective/Objective:                 Admitted abd pain/ unresolved diverticulitis. PMH is significant for atrial fibrillation, hx of CVA, hypertension, type 2 diabetes, tachycardia-bradycardia syndrome s/p pacemaker, prior pericardial effusion s/p pericardial window 12/16, sleep apnea (no CPAP), GI bleeding with hemorrhoids, and arthritis.     Action/Plan: Return to home   Expected Discharge Date:                  Expected Discharge Plan:  Home/Self Care  In-House Referral:     Discharge planning Services  CM Consult  Post Acute Care Choice:    Choice offered to:     DME Arranged:    DME Agency:     HH Arranged:    HH Agency:     Status of Service:  In process, will continue to follow  Medicare Important Message Given:    Date Medicare IM Given:    Medicare IM give by:    Date Additional Medicare IM Given:    Additional Medicare Important Message give by:     If discussed at [place] of Stay Meetings, dates discussed:    Additional Comments:  [name], [place] [phone] 04/07/2015, 4:19 PM

## 2015-04-08 DIAGNOSIS — K5732 Diverticulitis of large intestine without perforation or abscess without bleeding: Principal | ICD-10-CM

## 2015-04-08 LAB — BASIC METABOLIC PANEL
ANION GAP: 10 (ref 5–15)
BUN: 17 mg/dL (ref 6–20)
CALCIUM: 9.1 mg/dL (ref 8.9–10.3)
CHLORIDE: 105 mmol/L (ref 101–111)
CO2: 25 mmol/L (ref 22–32)
CREATININE: 1.01 mg/dL — AB (ref 0.44–1.00)
GFR calc non Af Amer: 48 mL/min — ABNORMAL LOW (ref >60)
GFR, EST AFRICAN AMERICAN: 55 mL/min — AB (ref >60)
Glucose, Bld: 109 mg/dL — ABNORMAL HIGH (ref 65–99)
Potassium: 3.5 mmol/L (ref 3.5–5.1)
SODIUM: 140 mmol/L (ref 135–145)

## 2015-04-08 LAB — GLUCOSE, CAPILLARY
GLUCOSE-CAPILLARY: 100 mg/dL — AB (ref 65–99)
GLUCOSE-CAPILLARY: 106 mg/dL — AB (ref 65–99)
Glucose-Capillary: 105 mg/dL — ABNORMAL HIGH (ref 65–99)
Glucose-Capillary: 120 mg/dL — ABNORMAL HIGH (ref 65–99)

## 2015-04-08 LAB — PROTIME-INR
INR: 2.01 — ABNORMAL HIGH (ref 0.00–1.49)
Prothrombin Time: 22.7 seconds — ABNORMAL HIGH (ref 11.6–15.2)

## 2015-04-08 LAB — CBC
HCT: 32.9 % — ABNORMAL LOW (ref 36.0–46.0)
HEMOGLOBIN: 10.7 g/dL — AB (ref 12.0–15.0)
MCH: 27.9 pg (ref 26.0–34.0)
MCHC: 32.5 g/dL (ref 30.0–36.0)
MCV: 85.9 fL (ref 78.0–100.0)
PLATELETS: 391 10*3/uL (ref 150–400)
RBC: 3.83 MIL/uL — AB (ref 3.87–5.11)
RDW: 14.2 % (ref 11.5–15.5)
WBC: 11.7 10*3/uL — AB (ref 4.0–10.5)

## 2015-04-08 LAB — CULTURE, BLOOD (ROUTINE X 2)
CULTURE: NO GROWTH
Culture: NO GROWTH

## 2015-04-08 MED ORDER — WHITE PETROLATUM GEL
Status: AC
  Administered 2015-04-08: 0.2
  Filled 2015-04-08: qty 1

## 2015-04-08 MED ORDER — WARFARIN SODIUM 2.5 MG PO TABS
2.5000 mg | ORAL_TABLET | Freq: Once | ORAL | Status: AC
  Administered 2015-04-08: 2.5 mg via ORAL
  Filled 2015-04-08: qty 1

## 2015-04-08 NOTE — Evaluation (Signed)
Physical Therapy Evaluation Patient Details Name: [name] MRN: [id number] DOB: 1923/09/12 Today's Date: 04/08/2015   History of Present Illness  Pt adm with abdominal pain and found to have diverticulitis. PMH - pericardial window, pacer, CVA, DM, GIB   Clinical Impression  Pt admitted with above diagnosis and presents to PT with functional limitations due to deficits listed below (See PT problem list). Pt needs skilled PT to maximize independence and safety to allow discharge to home alone. Will follow acutely to progress away from rolling walker but do not feel she will need PT after DC.     Follow Up Recommendations No PT follow up    Equipment Recommendations  None recommended by PT    Recommendations for Other Services       Precautions / Restrictions Precautions Precautions: None Restrictions Weight Bearing Restrictions: No      Mobility  Bed Mobility Overal bed mobility: Modified Independent                Transfers Overall transfer level: Modified independent Equipment used: Rolling walker (2 wheeled);None                Ambulation/Gait Ambulation/Gait assistance: Supervision;Modified independent (Device/Increase time) Ambulation Distance (Feet): 215 Feet Assistive device: Rolling walker (2 wheeled);None Gait Pattern/deviations: Step-through pattern;Decreased stride length   Gait velocity interpretation: at or above normal speed for age/gender General Gait Details: Steady gait with walker. In room amb without walker but used furniture   Stairs            Wheelchair Mobility    Modified Rankin (Stroke Patients Only)       Balance Overall balance assessment: Needs assistance Sitting-balance support: No upper extremity supported;Feet supported Sitting balance-Leahy Scale: Good     Standing balance support: No upper extremity supported;During functional activity Standing balance-Leahy Scale: Fair                                Pertinent Vitals/Pain Pain Assessment: No/denies pain    Home Living Family/patient expects to be discharged to:: Private residence Living Arrangements: Alone Available Help at Discharge: Family;Available PRN/intermittently Type of Home: House Home Access: Stairs to enter Entrance Stairs-Rails: Left Entrance Stairs-Number of Steps: 2 Home Layout: Two level Home Equipment: Walker - 2 wheels      Prior Function Level of Independence: Independent               Hand Dominance   Dominant Hand: Right    Extremity/Trunk Assessment   Upper Extremity Assessment: Overall WFL for tasks assessed           Lower Extremity Assessment: Overall WFL for tasks assessed         Communication   Communication: HOH  Cognition Arousal/Alertness: Awake/alert Behavior During Therapy: WFL for tasks assessed/performed Overall Cognitive Status: Within Functional Limits for tasks assessed                      General Comments      Exercises        Assessment/Plan    PT Assessment Patient needs continued PT services  PT Diagnosis Difficulty walking   PT Problem List Decreased mobility  PT Treatment Interventions Gait training;Functional mobility training;Patient/family education   PT Goals (Current goals can be found in the Care Plan section) Acute Rehab PT Goals Patient Stated Goal: Return to her home PT Goal Formulation: With patient Time  For Goal Achievement: 04/12/15 Potential to Achieve Goals: Good    Frequency Min 3X/week   Barriers to discharge        Co-evaluation               End of Session   Activity Tolerance: Patient tolerated treatment well Patient left: in chair;with call bell/phone within reach;with family/visitor present Nurse Communication: Mobility status;Other (comment) (SpO2 95% on RA with amb)         Time: [id number] PT Time Calculation (min) (ACUTE ONLY): 22 min   Charges:   PT Evaluation $PT Eval  Moderate Complexity: 1 Procedure     PT G Codes:        Brandie Lopes 04-13-2015, 12:28 PM [facility] PT [phone]

## 2015-04-08 NOTE — Progress Notes (Signed)
ANTICOAGULATION CONSULT NOTE - Follow-up Consult Pharmacy Consult for Warfarin Indication: atrial fibrillation   Patient Measurements: Height: 5\' 5"  (165.1 cm) Weight: 157 lb 3.2 oz (71.305 kg) IBW/kg (Calculated) : 57  Vital Signs: Temp: 97.8 F (36.6 C) (03/13 0544) BP: 118/50 mmHg (03/13 0544) Pulse Rate: 60 (03/13 0544)  Labs:  Recent Labs  04/06/15 0651 04/07/15 0733 04/07/15 1811 04/08/15 0558 04/08/15 0826  HGB 10.5* 10.4*  --   --  10.7*  HCT 32.3* 33.8*  --   --  32.9*  PLT 333 342  --   --  391  LABPROT 61.2* 30.3* 26.1* 22.7*  --   INR 7.54* 2.96* 2.43* 2.01*  --   CREATININE 1.30* 1.07*  --  1.01*  --     Assessment: 82 yo female presenting with abdominal pain. Warfarin initially started on 3/8 but stopped on 3/10 after rapid increase in INR due to fluconazole. Warfarin held and subsequently received vitamin K 2.5 mg PO on 3/11. INR down to 2.01 today. CBC stable with no reported sxs of bleeding. Remains on Ertapenem and Fluconazole for acute sigmoid diverticulitis. Dosing and INR as below.  Date 3/8 3/9 3/10 3/11 3/12 3/13  INR 2.07 2.52 4.57 7.54 2.96 2.01  Warfarin dose 2.5 mg  5 mg HELD HELD 2.5 mg PO VitK 2 mg   * fluconazole started 03/9*  PTA dose: 5 mg on Sun/Tue/Thu and 2.5 mg AOD's per clinic notes  Goal of Therapy:  INR 2-3 Monitor platelets by anticoagulation protocol: Yes   Plan:  - Warfarin 2.5 mg PO tonight - Likely to require lower dose while on fluconazole although trying to keep >2 s/p vitamin K - Daily INR, CBC  [place], Pharm.D., BCPS Clinical Pharmacist Pager: [phone] 04/08/2015 10:44 AM

## 2015-04-08 NOTE — Progress Notes (Signed)
Family Medicine Teaching Service Daily Progress Note Intern Pager: [phone]  Patient name: [name] Medical record number: [id number] Date of birth: [birth year] Age: 82 y.o. Gender: female  Primary Care Provider: [name], MD Consultants: Surgery Code Status: Full  Pt Overview and Major Events to Date:  Admitted 04/03/15: started on Zosyn 3/9: switched to Ertapenem and Diflucan per surgery   Assessment and Plan: SHERRAN MEDLAR is a 82 y.o. female presenting with abdominal pain. PMH is significant for atrial fibrillation, hx of CVA, hypertension, type 2 diabetes, tachycardia-bradycardia syndrome s/p pacemaker, prior pericardial effusion s/p pericardial window 12/16, sleep apnea (no CPAP), GI bleeding with hemorrhoids, and arthritis. Pain is likely due to recurrent vs. unresolved diverticulitis. However, CT, exam and family history concerning for colonic mass.   #Left Lower Quadrant Abdominal pain, overall improving: Worsening left lower quadrant pain with fevers and poor PO. Concern was for perforated or abscess from diverticuilits which patient has been treated for over the last month. Per PCP note patient was seen by GI (Dr. Watt Climes) as outpatient. Patient with hx of AVMs cauterized in 2013 during colonoscopy, also hx of hemorrhoids. Patient's sister had colon cancer, and her daughter had an APR. Stable vitals. Leukocytosis to 16.1, improved to 10.8, but slightly worsened to 11.7 today. CT with evidence of left colonic diverticulitis with pericolonic edema, consistent with acute inflammation but unchanged from last month's imaging. Cannot rule out cancerous process. Lower quadrant pain differential includes acute cystitis versus acute colitis versus diverticulitis. Exam concerning for guarding and distension but CT abdomen reassuring against acute abdomen. Afebrile 48 hours.  - Switched antibiotics from zosyn to ertapenem 04/04/15 with diflucan, as recommended by surgery. Would hold off on  quick transition to oral antibiotics since patient has failed 2 different oral regimens as outpatient  - CRP elevated at 3.5 compared to 0.6 at last admission - Urine culture final (NG x 1 day) - Blood culture pending (NG x 5 days) - Obtain daily CBCs and BMETs - trend fevers - Strict I's and O's - Morphine 2 mg q4h for pain - Patient refuses tylenol (hurts her liver) - Consulted general surgery for concern for need for resection. Appreciate recommendations. No significant improvement on symptoms on abx. Continue abx, if pt fails to improve, need to discuss surgical options.  In addition, patient is not interested in surgery at this time. Could consider colonoscopy with biopsy once recovered to rule out cancer.  Per surgery, will go back to a full liquid diet and continue current abx regimen.  - PT eval   #Anemia: hgb 11.5 on admission (baseline 11.5). Long-term history of rectal bleeding. With history of hemorrhoids. Stable at 10.7. - Monitor with am CBCs - FOBT negative in ED  - Monitor for GI bleed  # Atrial fibrillation: ECHO EF 0000000, no diastolic dysfunction. - Continue Verapamil and Digoxin  - coumadin per pharmacy   # Elevated INR: Resovled. 2/01 (3/13) S/p Vitamin K 2.5g.    # Type 2 diabetes mellitus: A1C 6.2  - SSI - Continue ACQHS  #Lower extremity edema: Patient says she usually wears compression hose. Documented history of CHF but ECHO in January 2017 w/ normal EF and no diastolic dysfunction, some moderate valvular disease. Has stasis dermatitis of lower legs. Pitting edema to mid shin bilaterally, no JVD or crackles on lung exam.  - Hold home Torsemide and metolazone  blood pressures are stable to low normal - Stict I/Os: only occurrences noted  (changed order to  strict I/O) - Daily weights: 157lb today (up 9 lb from admission) - KVO fluids 3/12  FEN/GI: NS KVO; Full liquid Prophylaxis: Coumadin   Disposition: Admitted on telemetry, home pending clinical  improvement  Subjective:  Just concerned about feeling weak overall. Has had some loose bowel movements with ? Blood in stool per patient.   Objective: Temp:  [97.8 F (36.6 C)-98.2 F (36.8 C)] 97.8 F (36.6 C) (03/13 0544) Pulse Rate:  [60-77] 60 (03/13 0544) Resp:  [16-19] 16 (03/13 0544) BP: (117-123)/(50-52) 118/50 mmHg (03/13 0544) SpO2:  [93 %-99 %] 93 % (03/13 0544) Weight:  [157 lb 3.2 oz (71.305 kg)] 157 lb 3.2 oz (71.305 kg) (03/13 0432) Physical Exam: General: Well-appearing female, resting in bed Cardiovascular: 2/6 holosytolic murmur best heard over right upper sternal border, RRR; no JVD Chest: Lungs CTAB. No increased WOB. Pacemaker present over right upper chest.  Abdomen: +BS, tenderness to palpation with guarding over LLW and suprapubic region, no rebound  Extremities: WWP, moves all spontaneously, pitting edema to upper ankle bilaterally .   Laboratory:  Recent Labs Lab 04/05/15 0553 04/06/15 0651 04/07/15 0733  WBC 15.0* 14.9* 10.8*  HGB 10.2* 10.5* 10.4*  HCT 33.3* 32.3* 33.8*  PLT 306 333 342    Recent Labs Lab 04/03/15 1435  04/06/15 0651 04/07/15 0733 04/08/15 0558  NA 140  < > 140 140 140  K 3.1*  < > 4.1 3.5 3.5  CL 98*  < > 100* 103 105  CO2 31  < > 27 26 25   BUN 13  < > 21* 18 17  CREATININE 1.11*  < > 1.30* 1.07* 1.01*  CALCIUM 9.1  < > 9.0 8.9 9.1  PROT 6.8  --   --   --   --   BILITOT 0.7  --   --   --   --   ALKPHOS 69  --   --   --   --   ALT 24  --   --   --   --   AST 34  --   --   --   --   GLUCOSE 95  < > 93 93 109*  < > = values in this interval not displayed.  Imaging/Diagnostic Tests: Ct Abdomen Pelvis Wo Contrast  04/03/2015  CLINICAL DATA:  Left-sided abdominal pain with nausea. EXAM: CT ABDOMEN AND PELVIS WITHOUT CONTRAST TECHNIQUE: Multidetector CT imaging of the abdomen and pelvis was performed following the standard protocol without IV contrast. COMPARISON:  03/08/2015.  08/06/2005. FINDINGS: Lower chest: Chronic  interstitial changes in the lung bases. The heart is enlarged. Pericardial effusion is incompletely visualized and is stable to slightly increased in the interval, remaining small to moderate in size. Hepatobiliary: No focal abnormality in the liver on this study without intravenous contrast. No evidence of hepatomegaly. Gallbladder is surgically absent. No intrahepatic or extrahepatic biliary dilation. Pancreas: No focal mass lesion. No dilatation of the main duct. No intraparenchymal cyst. No peripancreatic edema. Spleen: No splenomegaly. No focal mass lesion. Adrenals/Urinary Tract: No adrenal nodule or mass. 8 mm hyper attenuating lesion in the interpolar right kidney is stable. 13 mm hyper attenuating lesion in the interpolar right kidney is stable. 2.3 cm exophytic water density lesion in the lower pole the right kidney is unchanged remains compatible with a cyst. Stable appearing cyst in the interpolar left kidney. Apparent exophytic lesion upper pole left kidney is seen to represent normal renal parenchyma with focal cortical scarring on the  previous CT scan from 08/06/2005. No evidence for hydroureter. The urinary bladder appears normal for the degree of distention. Stomach/Bowel: Stomach is nondistended. No gastric wall thickening. No evidence of outlet obstruction. Duodenum is normally positioned as is the ligament of Treitz. No small bowel wall thickening. No small bowel dilatation. The terminal ileum is normal. The appendix is normal. Left colonic diverticulosis is evident. Pericolonic edema/ inflammation is seen around the distal descending colon and proximal sigmoid segment, suggesting acute diverticulitis. This pericolonic edema/inflammation is not substantially different than on the prior study and there is a stable small volume of fluid in the root of the sigmoid mesocolon. There is stable appearance of circumferential wall thickening in the mid sigmoid segment, likely related to muscular  hypertrophy from underlying diverticulosis. Vascular/Lymphatic: There is abdominal aortic atherosclerosis without aneurysm. There is no gastrohepatic or hepatoduodenal ligament lymphadenopathy. No intraperitoneal or retroperitoneal lymphadenopathy. Small lymph nodes are identified in the sigmoid mesocolon. Reproductive: Uterus is surgically absent. There is no adnexal mass. Other: No intraperitoneal free fluid. Musculoskeletal: Bone windows reveal no worrisome lytic or sclerotic osseous lesions. IMPRESSION: 1. No substantial interval change in the pericolonic edema/inflammation associated with the distal descending and proximal sigmoid colon. Imaging features are compatible with acute diverticulitis although the lack of interval change may be related to persistent or recurrent disease. No evidence for perforation or abscess. Given the persistence, underlying neoplastic involvement should be considered. 2. Bilateral renal cysts. High attenuation lesions in the right kidney may be related to proteinaceous or hemorrhagic cyst. Electronically Signed   By: Misty Stanley M.D.   On: 04/03/2015 17:54    Nicolette Bang, DO 04/08/2015, 8:51 AM PGY-1, [place] Intern pager: [phone], text pages welcome

## 2015-04-08 NOTE — Progress Notes (Signed)
UR COMPLETED  

## 2015-04-08 NOTE — Consult Note (Signed)
[facility] CM Inpatient Consult   04/08/2015  [name] April 11, 1923 [id number] Consult received for post hospital monitoring. Patient was assessed for [name] for community services. Patient was previously active with [name].  Met with patient at bedside regarding being restarted with [facility] services. Patient verbalizes, "I was doing so well with managing and then my medications got changed and now I'm having all of this fluid.  I was up about 15 pounds."  Patient agrees to post hospital follow up.   Consent form active in EPIC.  Of note, [name] services does not replace or interfere with any services that are arranged by inpatient case management or social work. For additional questions or referrals please contact: Natividad Brood, RN BSN [facility] Liaison  [phone] business mobile phone Toll free office [phone]

## 2015-04-08 NOTE — Care Management Important Message (Signed)
Important Message  Patient Details  Name: [name] MRN: [id number] Date of Birth: 16-Jul-1923   Medicare Important Message Given:  Yes    Lillianne Eick [name] 04/08/2015, 9:55 AM

## 2015-04-08 NOTE — Progress Notes (Signed)
  Subjective: Patient still having some LLQ tenderness Had several episodes of loose bowel movements yesterday No fever On soft diet Afebrile  Objective: Vital signs in last 24 hours: Temp:  [97.8 F (36.6 C)-98.2 F (36.8 C)] 97.8 F (36.6 C) (03/13 0544) Pulse Rate:  [60-63] 60 (03/13 0544) Resp:  [16-19] 16 (03/13 0544) BP: (117-123)/(50-52) 118/50 mmHg (03/13 0544) SpO2:  [93 %-99 %] 93 % (03/13 0544) Weight:  [71.305 kg (157 lb 3.2 oz)] 71.305 kg (157 lb 3.2 oz) (03/13 0432) Last BM Date: 04/08/15  Intake/Output from previous day: 03/12 0701 - 03/13 0700 In: 1000 [P.O.:1000] Out: -  Intake/Output this shift:    General appearance: alert, cooperative and no distress Abd - soft, mildly tender in LLQ; no palpable masses; no peritoneal signs  Lab Results:   Recent Labs  04/07/15 0733 04/08/15 0826  WBC 10.8* 11.7*  HGB 10.4* 10.7*  HCT 33.8* 32.9*  PLT 342 391   BMET  Recent Labs  04/07/15 0733 04/08/15 0558  NA 140 140  K 3.5 3.5  CL 103 105  CO2 26 25  GLUCOSE 93 109*  BUN 18 17  CREATININE 1.07* 1.01*  CALCIUM 8.9 9.1   PT/INR  Recent Labs  04/07/15 1811 04/08/15 0558  LABPROT 26.1* 22.7*  INR 2.43* 2.01*   ABG No results for input(s): PHART, HCO3 in the last 72 hours.  Invalid input(s): PCO2, PO2  Studies/Results: No results found.  Anti-infectives: Anti-infectives    Start     Dose/Rate Route Frequency Ordered Stop   04/08/15 1300  ertapenem (INVANZ) 1 g in sodium chloride 0.9 % 50 mL IVPB     1 g 100 mL/hr over 30 Minutes Intravenous Every 24 hours 04/07/15 1232     04/04/15 1300  ertapenem (INVANZ) 0.5 g in sodium chloride 0.9 % 50 mL IVPB  Status:  Discontinued     500 mg 100 mL/hr over 30 Minutes Intravenous Every 24 hours 04/04/15 1218 04/07/15 1232   04/04/15 1300  fluconazole (DIFLUCAN) IVPB 200 mg     200 mg 100 mL/hr over 60 Minutes Intravenous Every 24 hours 04/04/15 1218     04/03/15 2100  piperacillin-tazobactam  (ZOSYN) IVPB 3.375 g  Status:  Discontinued     3.375 g 12.5 mL/hr over 240 Minutes Intravenous Every 8 hours 04/03/15 2042 04/04/15 1218   04/03/15 1500  piperacillin-tazobactam (ZOSYN) IVPB 3.375 g     3.375 g 100 mL/hr over 30 Minutes Intravenous  Once 04/03/15 1448 04/03/15 1640      Assessment/Plan:  Sigmoid diverticulitis - persistent, mildly increased WBC On Invanz Will back diet to full liquids Continue abx Will try to avoid surgery in this elderly patient with multiple medical/ cardiac comorbidities.  Surgery would likely involve a colostomy.    LOS: 4 days    Marcheta Horsey K. 04/08/2015 2

## 2015-04-08 NOTE — Progress Notes (Signed)
Pt ambulated well with physical therapy and her oxygen saturation levels were maintained at 96% with ambulation. Pt states oxygen level only drops when she's sleeping at night.

## 2015-04-09 LAB — GLUCOSE, CAPILLARY
GLUCOSE-CAPILLARY: 97 mg/dL (ref 65–99)
Glucose-Capillary: 99 mg/dL (ref 65–99)
Glucose-Capillary: 97 mg/dL (ref 65–99)
Glucose-Capillary: 98 mg/dL (ref 65–99)

## 2015-04-09 LAB — C DIFFICILE QUICK SCREEN W PCR REFLEX
C DIFFICILE (CDIFF) INTERP: POSITIVE
C DIFFICILE (CDIFF) TOXIN: POSITIVE — AB
C DIFFICLE (CDIFF) ANTIGEN: POSITIVE — AB

## 2015-04-09 LAB — PROTIME-INR
INR: 2.73 — AB (ref 0.00–1.49)
Prothrombin Time: 28.5 seconds — ABNORMAL HIGH (ref 11.6–15.2)

## 2015-04-09 LAB — BASIC METABOLIC PANEL
ANION GAP: 11 (ref 5–15)
BUN: 14 mg/dL (ref 6–20)
CO2: 23 mmol/L (ref 22–32)
Calcium: 9.3 mg/dL (ref 8.9–10.3)
Chloride: 106 mmol/L (ref 101–111)
Creatinine, Ser: 0.91 mg/dL (ref 0.44–1.00)
GFR calc Af Amer: >60 mL/min (ref >60)
GFR, EST NON AFRICAN AMERICAN: 54 mL/min — AB (ref >60)
Glucose, Bld: 99 mg/dL (ref 65–99)
POTASSIUM: 3.4 mmol/L — AB (ref 3.5–5.1)
SODIUM: 140 mmol/L (ref 135–145)

## 2015-04-09 MED ORDER — TORSEMIDE 20 MG PO TABS
60.0000 mg | ORAL_TABLET | Freq: Every day | ORAL | Status: DC
  Administered 2015-04-09 – 2015-04-11 (×3): 60 mg via ORAL
  Filled 2015-04-09 (×3): qty 3

## 2015-04-09 MED ORDER — VANCOMYCIN 50 MG/ML ORAL SOLUTION
125.0000 mg | Freq: Four times a day (QID) | ORAL | Status: DC
  Administered 2015-04-09 – 2015-04-11 (×11): 125 mg via ORAL
  Filled 2015-04-09: qty 3
  Filled 2015-04-09 (×6): qty 2.5
  Filled 2015-04-09: qty 3
  Filled 2015-04-09 (×6): qty 2.5

## 2015-04-09 MED ORDER — POTASSIUM CHLORIDE CRYS ER 20 MEQ PO TBCR
20.0000 meq | EXTENDED_RELEASE_TABLET | Freq: Two times a day (BID) | ORAL | Status: AC
  Administered 2015-04-09 (×2): 20 meq via ORAL
  Filled 2015-04-09 (×2): qty 1

## 2015-04-09 NOTE — Progress Notes (Signed)
ANTICOAGULATION CONSULT NOTE - Follow-up Consult  Pharmacy Consult for Warfarin Indication: atrial fibrillation   Patient Measurements: Height: 5\' 5"  (165.1 cm) Weight: 159 lb 2.8 oz (72.2 kg) IBW/kg (Calculated) : 57  Vital Signs: Temp: 98.2 F (36.8 C) (03/14 0520) Temp Source: Oral (03/14 0520) BP: 122/62 mmHg (03/14 0520) Pulse Rate: 60 (03/14 0520)  Labs:  Recent Labs  04/07/15 0733 04/07/15 1811 04/08/15 0558 04/08/15 0826 04/09/15 0546  HGB 10.4*  --   --  10.7*  --   HCT 33.8*  --   --  32.9*  --   PLT 342  --   --  391  --   LABPROT 30.3* 26.1* 22.7*  --  28.5*  INR 2.96* 2.43* 2.01*  --  2.73*  CREATININE 1.07*  --  1.01*  --  0.91    Assessment: 82 yo female presenting with abdominal pain. Warfarin initially started on 3/8 but stopped on 3/10 after rapid increase in INR due to fluconazole. See below.  Date 3/8 3/9 3/10 3/11 3/12 3/13 3/14  INR 2.07 2.52 4.57 7.54 2.96 2.01 2.73  Warfarin dose 2.5 mg  5 mg HELD HELD 2.5 mg PO VitK 2 mg 2.5mg  HELD  * fluconazole started 03/9*  PTA dose: 5 mg on Sun/Tue/Thu and 2.5 mg AOD's per clinic notes  Hgb and plts stable, no bleeding noted.  Remains on Ertapenem and Fluconazole for acute sigmoid diverticulitis.  Goal of Therapy:  INR 2-3 Monitor platelets by anticoagulation protocol: Yes   Plan:  -hold warfarin tonight with large jump -daily INR, CBC -follow for s/s bleeding -follow for LOT of antibiotics  Anie Juniel D. Sosha Shepherd, [name], BCPS Clinical Pharmacist Pager: [phone] 04/09/2015 9:06 AM

## 2015-04-09 NOTE — Progress Notes (Signed)
Physical Therapy Treatment Patient Details Name: [name] MRN: [id number] DOB: 1923/03/19 Today's Date: May 02, 2015    History of Present Illness Pt adm with abdominal pain and found to have diverticulitis. PMH - pericardial window, pacer, CVA, DM, GIB     PT Comments    Pt performed increased gait distance with cues for forward gaze.  Pt remains to require cues for safety.  Pt still present with loose stools.    Follow Up Recommendations  No PT follow up     Equipment Recommendations  None recommended by PT    Recommendations for Other Services       Precautions / Restrictions Precautions Precautions: None Restrictions Weight Bearing Restrictions: No    Mobility  Bed Mobility Overal bed mobility: Modified Independent                Transfers Overall transfer level: Modified independent Equipment used: Rolling walker (2 wheeled);None             General transfer comment: Required increased sit to stand trials due to bouts of diarrhea.    Ambulation/Gait Ambulation/Gait assistance: Supervision   Assistive device: Rolling walker (2 wheeled) Gait Pattern/deviations: Step-through pattern;Decreased stride length;Trunk flexed     General Gait Details: Cues for forward gaze and safe positon within RW.     Stairs            Wheelchair Mobility    Modified Rankin (Stroke Patients Only)       Balance     Sitting balance-Leahy Scale: Good       Standing balance-Leahy Scale: Fair                      Cognition Arousal/Alertness: Awake/alert Behavior During Therapy: WFL for tasks assessed/performed Overall Cognitive Status: Within Functional Limits for tasks assessed                      Exercises      General Comments        Pertinent Vitals/Pain Pain Assessment: No/denies pain    Home Living                      Prior Function            PT Goals (current goals can now be found in the care  plan section) Acute Rehab PT Goals Patient Stated Goal: Return to her home Potential to Achieve Goals: Good Progress towards PT goals: Progressing toward goals    Frequency  Min 3X/week    PT Plan      Co-evaluation             End of Session Equipment Utilized During Treatment: Gait belt Activity Tolerance: Patient tolerated treatment well Patient left: in chair;with call bell/phone within reach;with family/visitor present;with chair alarm set     Time: [id number] PT Time Calculation (min) (ACUTE ONLY): 32 min  Charges:  $Gait Training: 23-37 mins                    G Codes:      Cristela Blue 02-May-2015, 2:37 PM  Governor Rooks, PTA pager [phone]

## 2015-04-09 NOTE — Progress Notes (Signed)
Patient was provided with print outs regarding contact precautions and cdiff education, handouts reviewed with patient verbalized understanding.  Tinita Brooker, Tivis Ringer, RN

## 2015-04-09 NOTE — Progress Notes (Signed)
Patient ID: [name], female   DOB: [birth year], 82 y.o.   MRN: [id number]     [facility]      [address] [place], [place] [address]    Phone: [phone] FAX: [phone]     Subjective: Did well with full liquids this AM. Having diarrhea. Still tenderness to llq Afebrile.  c diff positive.   Objective:  Vital signs:  Filed Vitals:   04/08/15 1310 04/08/15 2217 04/09/15 0500 04/09/15 0520  BP: 122/52 120/53  122/62  Pulse: 61 78  60  Temp: 98.6 F (37 C) 98.2 F (36.8 C)  98.2 F (36.8 C)  TempSrc: Oral Oral  Oral  Resp: '18 18  18  '$ Height:      Weight:   72.2 kg (159 lb 2.8 oz)   SpO2: 98% 92%  98%    Last BM Date: 04/09/15  Intake/Output   Yesterday:  03/13 0701 - 03/14 0700 In: 430 [P.O.:350; I.V.:80] Out: -  This shift:  Total I/O In: 120 [P.O.:120] Out: -    Physical Exam: General: Pt awake/alert/oriented x4 in no acute distress Abdomen: Soft.  Nondistended.  Mild ttp llq.  No evidence of peritonitis.  No incarcerated hernias.    Problem List:   Active Problems:   Chronic anticoagulation - Coumadin, CHADS2VASC=7   Pacemaker   Cerebral embolism with cerebral infarction ([place])   Diabetes ([place])   Diverticulitis   Left sided abdominal pain   Supratherapeutic INR   Paroxysmal atrial fibrillation (HCC)   Essential hypertension, benign    Results:   Labs: Results for orders placed or performed during the hospital encounter of 04/03/15 (from the past 48 hour(s))  Glucose, capillary     Status: None   Collection Time: 04/07/15 12:06 PM  Result Value Ref Range   Glucose-Capillary 86 65 - 99 mg/dL  Glucose, capillary     Status: Abnormal   Collection Time: 04/07/15  5:04 PM  Result Value Ref Range   Glucose-Capillary 113 (H) 65 - 99 mg/dL  Protime-INR     Status: Abnormal   Collection Time: 04/07/15  6:11 PM  Result Value Ref Range   Prothrombin Time 26.1 (H) 11.6 - 15.2 seconds   INR 2.43 (H) 0.00 - 1.49  Glucose, capillary     Status: Abnormal   Collection Time: 04/07/15  9:06 PM  Result Value Ref Range   Glucose-Capillary 112 (H) 65 - 99 mg/dL   Comment 1 Notify RN    Comment 2 Document in Chart   Protime-INR     Status: Abnormal   Collection Time: 04/08/15  5:58 AM  Result Value Ref Range   Prothrombin Time 22.7 (H) 11.6 - 15.2 seconds   INR 2.01 (H) 0.00 - 4.26  Basic metabolic panel     Status: Abnormal   Collection Time: 04/08/15  5:58 AM  Result Value Ref Range   Sodium 140 135 - 145 mmol/L   Potassium 3.5 3.5 - 5.1 mmol/L   Chloride 105 101 - 111 mmol/L   CO2 25 22 - 32 mmol/L   Glucose, Bld 109 (H) 65 - 99 mg/dL   BUN 17 6 - 20 mg/dL   Creatinine, Ser 1.01 (H) 0.44 - 1.00 mg/dL   Calcium 9.1 8.9 - 10.3 mg/dL   GFR calc non Af Amer 48 (L) >60 mL/min   GFR calc Af Amer 55 (L) >60 mL/min    Comment: (NOTE) The eGFR has been  calculated using the CKD EPI equation. This calculation has not been validated in all clinical situations. eGFR's persistently <60 mL/min signify possible Chronic Kidney Disease.    Anion gap 10 5 - 15  Glucose, capillary     Status: Abnormal   Collection Time: 04/08/15  8:18 AM  Result Value Ref Range   Glucose-Capillary 100 (H) 65 - 99 mg/dL  CBC     Status: Abnormal   Collection Time: 04/08/15  8:26 AM  Result Value Ref Range   WBC 11.7 (H) 4.0 - 10.5 K/uL   RBC 3.83 (L) 3.87 - 5.11 MIL/uL   Hemoglobin 10.7 (L) 12.0 - 15.0 g/dL   HCT 32.9 (L) 36.0 - 46.0 %   MCV 85.9 78.0 - 100.0 fL   MCH 27.9 26.0 - 34.0 pg   MCHC 32.5 30.0 - 36.0 g/dL   RDW 14.2 11.5 - 15.5 %   Platelets 391 150 - 400 K/uL  Glucose, capillary     Status: Abnormal   Collection Time: 04/08/15 11:40 AM  Result Value Ref Range   Glucose-Capillary 105 (H) 65 - 99 mg/dL  Glucose, capillary     Status: Abnormal   Collection Time: 04/08/15  5:01 PM  Result Value Ref Range   Glucose-Capillary 120 (H) 65 - 99 mg/dL  C difficile quick scan w PCR  reflex     Status: Abnormal   Collection Time: 04/08/15  8:58 PM  Result Value Ref Range   C Diff antigen POSITIVE (A) NEGATIVE   C Diff toxin POSITIVE (A) NEGATIVE   C Diff interpretation Positive for toxigenic C. difficile     Comment: CRITICAL RESULT CALLED TO, READ BACK BY AND VERIFIED WITH: Lucky Rathke AT [id number] 04/09/15 BY L BENFIELD   Glucose, capillary     Status: Abnormal   Collection Time: 04/08/15 10:16 PM  Result Value Ref Range   Glucose-Capillary 106 (H) 65 - 99 mg/dL   Comment 1 Notify RN    Comment 2 Document in Chart   Protime-INR     Status: Abnormal   Collection Time: 04/09/15  5:46 AM  Result Value Ref Range   Prothrombin Time 28.5 (H) 11.6 - 15.2 seconds   INR 2.73 (H) 0.00 - 6.06  Basic metabolic panel     Status: Abnormal   Collection Time: 04/09/15  5:46 AM  Result Value Ref Range   Sodium 140 135 - 145 mmol/L   Potassium 3.4 (L) 3.5 - 5.1 mmol/L   Chloride 106 101 - 111 mmol/L   CO2 23 22 - 32 mmol/L   Glucose, Bld 99 65 - 99 mg/dL   BUN 14 6 - 20 mg/dL   Creatinine, Ser 0.91 0.44 - 1.00 mg/dL   Calcium 9.3 8.9 - 10.3 mg/dL   GFR calc non Af Amer 54 (L) >60 mL/min   GFR calc Af Amer >60 >60 mL/min    Comment: (NOTE) The eGFR has been calculated using the CKD EPI equation. This calculation has not been validated in all clinical situations. eGFR's persistently <60 mL/min signify possible Chronic Kidney Disease.    Anion gap 11 5 - 15  Glucose, capillary     Status: None   Collection Time: 04/09/15  8:09 AM  Result Value Ref Range   Glucose-Capillary 97 65 - 99 mg/dL    Imaging / Studies: No results found.  Medications / Allergies:  Scheduled Meds: . acetaminophen  650 mg Oral 4 times per day  . digoxin  0.0625 mg  Oral Daily  . ertapenem  1 g Intravenous Q24H  . fluconazole (DIFLUCAN) IV  200 mg Intravenous Q24H  . insulin aspart  0-9 Units Subcutaneous TID WC  . lactobacillus acidophilus & bulgar  1 tablet Oral TID WC  . sodium chloride  flush  3 mL Intravenous Q12H  . vancomycin  125 mg Oral QID  . verapamil  180 mg Oral QHS  . Warfarin - Pharmacist Dosing Inpatient   Does not apply q1800   Continuous Infusions: . sodium chloride 10 mL/hr at 04/07/15 1153   PRN Meds:.morphine injection, ondansetron **OR** ondansetron (ZOFRAN) IV  Antibiotics: Anti-infectives    Start     Dose/Rate Route Frequency Ordered Stop   04/09/15 0130  vancomycin (VANCOCIN) 50 mg/mL oral solution 125 mg     125 mg Oral 4 times daily 04/09/15 0114 04/22/15 2159   04/08/15 1300  ertapenem (INVANZ) 1 g in sodium chloride 0.9 % 50 mL IVPB     1 g 100 mL/hr over 30 Minutes Intravenous Every 24 hours 04/07/15 1232     04/04/15 1300  ertapenem (INVANZ) 0.5 g in sodium chloride 0.9 % 50 mL IVPB  Status:  Discontinued     500 mg 100 mL/hr over 30 Minutes Intravenous Every 24 hours 04/04/15 1218 04/07/15 1232   04/04/15 1300  fluconazole (DIFLUCAN) IVPB 200 mg     200 mg 100 mL/hr over 60 Minutes Intravenous Every 24 hours 04/04/15 1218     04/03/15 2100  piperacillin-tazobactam (ZOSYN) IVPB 3.375 g  Status:  Discontinued     3.375 g 12.5 mL/hr over 240 Minutes Intravenous Every 8 hours 04/03/15 2042 04/04/15 1218   04/03/15 1500  piperacillin-tazobactam (ZOSYN) IVPB 3.375 g     3.375 g 100 mL/hr over 30 Minutes Intravenous  Once 04/03/15 1448 04/03/15 1640        Assessment/Plan Sigmoid diverticulitis-mild ttp llq, tolerating fulls, afebrile. Overall, improving.  Advance to a soft diet.  CBC in AM.   ID-invanz D#5 Fluconazole D#5.  Vanc PO for c diff   Erby Pian, ANP-BC [facility] Pager [phone](7A-4:30P) For consults and floor pages call [phone](7A-4:30P)  04/09/2015 9:51 AM

## 2015-04-09 NOTE — Progress Notes (Signed)
Family Medicine Teaching Service Daily Progress Note Intern Pager: [phone]  Patient name: [name] Medical record number: [id number] Date of birth: 1923/03/29 Age: 82 y.o. Gender: female  Primary Care Provider: [name], MD Consultants: Surgery Code Status: Full  Pt Overview and Major Events to Date:  Admitted 04/03/15: started on Zosyn 3/9: switched to Ertapenem and Diflucan per surgery  3/14: C diff positive. Added PO vancomycin.   Assessment and Plan: ROSIA RHOME is a 82 y.o. female presenting with abdominal pain. PMH is significant for atrial fibrillation, hx of CVA, hypertension, type 2 diabetes, tachycardia-bradycardia syndrome s/p pacemaker, prior pericardial effusion s/p pericardial window 12/16, sleep apnea (no CPAP), GI bleeding with hemorrhoids, and arthritis. Pain is likely due to recurrent vs. unresolved diverticulitis. CRP was elevated from last admission at 3.5, fitting with acute diverticulitis. However, CT, exam and family history concerning for colonic mass. Surgery consulting and believes picture most consistent with diverticulitis.   #Left Lower Quadrant Abdominal pain, overall improving: Worsening left lower quadrant pain with fevers and poor PO. Concern was for perforated or abscess from diverticuilits which patient has been treated for over the last month. Per PCP note patient was seen by GI (Dr. Watt Climes) as outpatient. Patient with hx of AVMs cauterized in 2013 during colonoscopy, also hx of hemorrhoids. Patient's sister had colon cancer, and her daughter had an APR. Stable vitals. Leukocytosis to 16.1, improving. Afebrile 72 hours.  - Consulted general surgery for concern for need for resection. Appreciate recommendations. Continue abx, if pt fails to improve, need to discuss surgical options, though patient is not interested in surgery at this time. Could consider colonoscopy with biopsy once recovered to rule out cancer.  - Switched antibiotics from zosyn  to ertapenem 04/04/15 with diflucan, as recommended by surgery. They recommend 14 day course with transition to PO cipro/flagyl once diet further advanced.  - Urine culture final (NG x 1 day) - Blood culture final (NG x 5 days) - trend fevers - Strict I's and O's --> net positive during hospitalization - Morphine 2 mg q4h for pain (not taken since 3/10) - Patient refuses tylenol (hurts her liver) - PT eval  - Advance to soft diet, as abdominal pain improved.  #C diff pos: Ag and toxin - Started PO vancomycin  #Hypophosphatema: 3.4 today - Replete with Kdur 20 mEq BID  #Anemia: hgb 11.5 on admission (baseline 11.5). Long-term history of rectal bleeding. With history of hemorrhoids. Has been stable.  - FOBT negative in ED  - Monitor for GI bleed --> patient and her nurse reporting just smears, no gross blood   # Atrial fibrillation: ECHO EF 0000000, no diastolic dysfunction. - Continue Verapamil and Digoxin  - coumadin per pharmacy   # Elevated INR: Resolved. S/p Vitamin K 2.5g.  - 2.73 today (04/09/15)  # Type 2 diabetes mellitus: A1C 6.2, CBGs low 100s  - SSI - Continue ACQHS  #Lower extremity edema: Patient says she usually wears compression hose. Documented history of CHF but ECHO in January 2017 w/ normal EF and no diastolic dysfunction, some moderate valvular disease. Has stasis dermatitis of lower legs. Pitting edema to mid shin bilaterally, bibasilar crackles on lung exam.   - Restart home torsemide 60 mg daily.   - [place] I/Os - Daily weights: 159lb today (up 11 lb from admission) - KVO'ed fluids 3/12  FEN/GI: NS KVO; soft diet Prophylaxis: Coumadin   Disposition: Admitted on telemetry, home pending clinical improvement  Subjective:  Continues to  have loose stools. Concerned about weight gain since admission.   Objective: Temp:  [98.2 F (36.8 C)-98.6 F (37 C)] 98.2 F (36.8 C) (03/14 0520) Pulse Rate:  [60-78] 60 (03/14 0520) Resp:  [18] 18 (03/14 0520) BP:  (120-122)/(52-62) 122/62 mmHg (03/14 0520) SpO2:  [92 %-98 %] 98 % (03/14 0520) Weight:  [159 lb 2.8 oz (72.2 kg)] 159 lb 2.8 oz (72.2 kg) (03/14 0500) Physical Exam: General: Well-appearing female, resting in bed Cardiovascular: 2/6 holosytolic murmur best heard over right upper sternal border, RRR; no JVD Chest: Bibasilar crackles. No increased WOB. Pacemaker present over right upper chest.  Abdomen: +BS, mild tenderness to palpation with guarding over LLQ (improved), no rebound  Extremities: WWP, moves all spontaneously, 1+ pitting edema to mid-shin bilaterally.   Laboratory:  Recent Labs Lab 04/06/15 0651 04/07/15 0733 04/08/15 0826  WBC 14.9* 10.8* 11.7*  HGB 10.5* 10.4* 10.7*  HCT 32.3* 33.8* 32.9*  PLT 333 342 391    Recent Labs Lab 04/03/15 1435  04/06/15 0651 04/07/15 0733 04/08/15 0558  NA 140  < > 140 140 140  K 3.1*  < > 4.1 3.5 3.5  CL 98*  < > 100* 103 105  CO2 31  < > 27 26 25   BUN 13  < > 21* 18 17  CREATININE 1.11*  < > 1.30* 1.07* 1.01*  CALCIUM 9.1  < > 9.0 8.9 9.1  PROT 6.8  --   --   --   --   BILITOT 0.7  --   --   --   --   ALKPHOS 69  --   --   --   --   ALT 24  --   --   --   --   AST 34  --   --   --   --   GLUCOSE 95  < > 93 93 109*  < > = values in this interval not displayed.  Imaging/Diagnostic Tests: Ct Abdomen Pelvis Wo Contrast  04/03/2015  CLINICAL DATA:  Left-sided abdominal pain with nausea. EXAM: CT ABDOMEN AND PELVIS WITHOUT CONTRAST TECHNIQUE: Multidetector CT imaging of the abdomen and pelvis was performed following the standard protocol without IV contrast. COMPARISON:  03/08/2015.  08/06/2005. FINDINGS: Lower chest: Chronic interstitial changes in the lung bases. The heart is enlarged. Pericardial effusion is incompletely visualized and is stable to slightly increased in the interval, remaining small to moderate in size. Hepatobiliary: No focal abnormality in the liver on this study without intravenous contrast. No evidence of  hepatomegaly. Gallbladder is surgically absent. No intrahepatic or extrahepatic biliary dilation. Pancreas: No focal mass lesion. No dilatation of the main duct. No intraparenchymal cyst. No peripancreatic edema. Spleen: No splenomegaly. No focal mass lesion. Adrenals/Urinary Tract: No adrenal nodule or mass. 8 mm hyper attenuating lesion in the interpolar right kidney is stable. 13 mm hyper attenuating lesion in the interpolar right kidney is stable. 2.3 cm exophytic water density lesion in the lower pole the right kidney is unchanged remains compatible with a cyst. Stable appearing cyst in the interpolar left kidney. Apparent exophytic lesion upper pole left kidney is seen to represent normal renal parenchyma with focal cortical scarring on the previous CT scan from 08/06/2005. No evidence for hydroureter. The urinary bladder appears normal for the degree of distention. Stomach/Bowel: Stomach is nondistended. No gastric wall thickening. No evidence of outlet obstruction. Duodenum is normally positioned as is the ligament of Treitz. No small bowel wall thickening. No small  bowel dilatation. The terminal ileum is normal. The appendix is normal. Left colonic diverticulosis is evident. Pericolonic edema/ inflammation is seen around the distal descending colon and proximal sigmoid segment, suggesting acute diverticulitis. This pericolonic edema/inflammation is not substantially different than on the prior study and there is a stable small volume of fluid in the root of the sigmoid mesocolon. There is stable appearance of circumferential wall thickening in the mid sigmoid segment, likely related to muscular hypertrophy from underlying diverticulosis. Vascular/Lymphatic: There is abdominal aortic atherosclerosis without aneurysm. There is no gastrohepatic or hepatoduodenal ligament lymphadenopathy. No intraperitoneal or retroperitoneal lymphadenopathy. Small lymph nodes are identified in the sigmoid mesocolon.  Reproductive: Uterus is surgically absent. There is no adnexal mass. Other: No intraperitoneal free fluid. Musculoskeletal: Bone windows reveal no worrisome lytic or sclerotic osseous lesions. IMPRESSION: 1. No substantial interval change in the pericolonic edema/inflammation associated with the distal descending and proximal sigmoid colon. Imaging features are compatible with acute diverticulitis although the lack of interval change may be related to persistent or recurrent disease. No evidence for perforation or abscess. Given the persistence, underlying neoplastic involvement should be considered. 2. Bilateral renal cysts. High attenuation lesions in the right kidney may be related to proteinaceous or hemorrhagic cyst. Electronically Signed   By: Misty Stanley M.D.   On: 04/03/2015 17:54    Avannah Decker Corinda Gubler, MD 04/09/2015, 7:20 AM PGY-1, [place] Intern pager: [phone], text pages welcome

## 2015-04-10 LAB — CBC
HEMATOCRIT: 33.4 % — AB (ref 36.0–46.0)
HEMOGLOBIN: 11 g/dL — AB (ref 12.0–15.0)
MCH: 28.6 pg (ref 26.0–34.0)
MCHC: 32.9 g/dL (ref 30.0–36.0)
MCV: 87 fL (ref 78.0–100.0)
Platelets: 358 10*3/uL (ref 150–400)
RBC: 3.84 MIL/uL — ABNORMAL LOW (ref 3.87–5.11)
RDW: 14.2 % (ref 11.5–15.5)
WBC: 7.8 10*3/uL (ref 4.0–10.5)

## 2015-04-10 LAB — BASIC METABOLIC PANEL
Anion gap: 10 (ref 5–15)
BUN: 10 mg/dL (ref 6–20)
CALCIUM: 8.8 mg/dL — AB (ref 8.9–10.3)
CO2: 26 mmol/L (ref 22–32)
CREATININE: 0.92 mg/dL (ref 0.44–1.00)
Chloride: 104 mmol/L (ref 101–111)
GFR calc Af Amer: >60 mL/min (ref >60)
GFR calc non Af Amer: 53 mL/min — ABNORMAL LOW (ref >60)
GLUCOSE: 102 mg/dL — AB (ref 65–99)
Potassium: 3.4 mmol/L — ABNORMAL LOW (ref 3.5–5.1)
Sodium: 140 mmol/L (ref 135–145)

## 2015-04-10 LAB — GLUCOSE, CAPILLARY
GLUCOSE-CAPILLARY: 117 mg/dL — AB (ref 65–99)
Glucose-Capillary: 95 mg/dL (ref 65–99)
Glucose-Capillary: 116 mg/dL — ABNORMAL HIGH (ref 65–99)
Glucose-Capillary: 94 mg/dL (ref 65–99)

## 2015-04-10 LAB — PROTIME-INR
INR: 3.42 — ABNORMAL HIGH (ref 0.00–1.49)
Prothrombin Time: 33.8 seconds — ABNORMAL HIGH (ref 11.6–15.2)

## 2015-04-10 MED ORDER — METOLAZONE 2.5 MG PO TABS
2.5000 mg | ORAL_TABLET | Freq: Once | ORAL | Status: DC
  Filled 2015-04-10: qty 1

## 2015-04-10 MED ORDER — POTASSIUM CHLORIDE CRYS ER 20 MEQ PO TBCR
20.0000 meq | EXTENDED_RELEASE_TABLET | Freq: Two times a day (BID) | ORAL | Status: DC
  Administered 2015-04-10 – 2015-04-11 (×3): 20 meq via ORAL
  Filled 2015-04-10 (×3): qty 1

## 2015-04-10 MED ORDER — AMOXICILLIN-POT CLAVULANATE 875-125 MG PO TABS
1.0000 | ORAL_TABLET | Freq: Two times a day (BID) | ORAL | Status: DC
  Administered 2015-04-10 – 2015-04-11 (×3): 1 via ORAL
  Filled 2015-04-10 (×3): qty 1

## 2015-04-10 NOTE — Progress Notes (Signed)
ANTICOAGULATION CONSULT NOTE - Follow-up Consult  Pharmacy Consult for Warfarin Indication: atrial fibrillation  Patient Measurements: Height: 5\' 5"  (165.1 cm) Weight: 154 lb 1.6 oz (69.9 kg) IBW/kg (Calculated) : 57  Vital Signs: Temp: 97.7 F (36.5 C) (03/15 0549) Temp Source: Oral (03/15 0549) BP: 111/54 mmHg (03/15 0549) Pulse Rate: 71 (03/15 0939)  Labs:  Recent Labs  04/08/15 0558 04/08/15 0826 04/09/15 0546 04/10/15 0500 04/10/15 0520  HGB  --  10.7*  --  11.0*  --   HCT  --  32.9*  --  33.4*  --   PLT  --  391  --  358  --   LABPROT 22.7*  --  28.5* 33.8*  --   INR 2.01*  --  2.73* 3.42*  --   CREATININE 1.01*  --  0.91  --  0.92    Assessment: 82 yo female presenting with abdominal pain. Warfarin initially started on 3/8 but stopped on 3/10 after rapid increase in INR due to fluconazole. See below.  Date 3/8 3/9 3/10 3/11 3/12 3/13 3/14 3/15  INR 2.07 2.52 4.57 7.54 2.96 2.01 2.73 3.42  Warfarin dose 2.5 mg  5 mg HELD HELD 2.5 mg PO VitK 2 mg 2.5mg  HELD HELD  * fluconazole started 03/9*  PTA dose: 5 mg on Sun/Tue/Thu and 2.5 mg AOD's per clinic notes  Hgb and plts stable, no bleeding noted.  Clinically improving.  Changed to PO Augmentin for acute sigmoid diverticulitis.  Goal of Therapy:  INR 2-3 Monitor platelets by anticoagulation protocol: Yes   Plan:  -hold warfarin tonight with elevated INR -daily INR, CBC -can fluconazole be discontinued? -follow for s/s bleeding -follow for LOT of antibiotics  [employer], Pharm.D., BCPS Clinical Pharmacist Pager [phone] 04/10/2015 1:36 PM

## 2015-04-10 NOTE — Progress Notes (Signed)
Family Medicine Teaching Service Daily Progress Note Intern Pager: [phone]  Patient name: [name] Medical record number: [id number] Date of birth: 1923/03/04 Age: 82 y.o. Gender: female  Primary Care Provider: [name], MD Consultants: Surgery Code Status: Full  Pt Overview and Major Events to Date:  Admitted 04/03/15: started on Zosyn 3/9: switched to Ertapenem and Diflucan per surgery  3/14: C diff positive. Added PO vancomycin.  3/15: Transitioned to PO abx, to take through 04/17/15  Assessment and Plan: [name] is a 82 y.o. female presenting with abdominal pain. PMH is significant for atrial fibrillation, hx of CVA, hypertension, type 2 diabetes, tachycardia-bradycardia syndrome s/p pacemaker, prior pericardial effusion s/p pericardial window 12/16, sleep apnea (no CPAP), GI bleeding with hemorrhoids, and arthritis. Pain is likely due to recurrent vs. unresolved diverticulitis. CRP was elevated from last admission at 3.5, fitting with acute diverticulitis. However, CT, exam and family history concerning for colonic mass. Surgery consulting and believes picture most consistent with diverticulitis. Pain and inflammation seen on imaging could also be 2/2 C diff infection.  #Left Lower Quadrant Abdominal pain, overall improving: Stable vitals. Leukocytosis to 16.1, improving. Afebrile 96 hours.  - Consulted general surgery for concern for need for resection. Appreciate recommendations. They recommend 14 day course of abx. Transition to PO cipro/flagyl or augmentin today. Continue soft diet until complete abx course.  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 Long-term history of rectal bleeding. With history of hemorrhoids. Has been stable.  - FOBT negative in ED  - Monitor for GI bleed --> patient and her nurse reporting just smears, no gross blood   # Atrial fibrillation: ECHO EF 0000000, no diastolic dysfunction. - Continue Verapamil and Digoxin  - coumadin per pharmacy   # Elevated INR: S/p Vitamin K 2.5g.  - 3.42 today (04/10/15)  # Type 2 diabetes mellitus: A1C 6.2, CBGs 90s/low 100s  - SSI - Continue ACQHS  #Lower extremity edema: Patient says she usually wears compression hose. Documented history of CHF but ECHO in January 2017 w/ normal EF and no diastolic dysfunction, some moderate valvular disease. Has stasis dermatitis of lower legs. Pitting edema to mid shin bilaterally. Bibasilar crackles gone today.  - Continue home torsemide 60 mg daily. Give dose of metolazone today, 04/10/15.  - [place]ict I/Os --> 9x urine - Wrap legs with ace bandages, per patient request - Daily weights: 154lb today (down 5 lbs since yesterday, but still up 6 lbs from admission) - KVO'ed fluids 3/12  FEN/GI: NS KVO; soft diet Prophylaxis: Coumadin   Disposition: Admitted on telemetry, home pending clinical improvement. Likely discharge 04/11/15 if tolerates transition to PO.  Subjective:  Continues to have loose stools but have decreased in frequency.    Objective: Temp:  [97.6 F (36.4 C)-98 F (36.7 C)] 97.7 F (36.5 C) (03/15 0549) Pulse Rate:  [58-111] 65 (03/15 0549)  Resp:  [18-24] 18 (03/15 0549) BP: (111-123)/(51-56) 111/54 mmHg (03/15 0549) SpO2:  [95 %-100 %] 100 % (03/15 0549) Weight:  [154 lb 1.6 oz (69.9 kg)] 154 lb 1.6 oz (69.9 kg) (03/15 0541) Physical Exam: General: Well-appearing female, resting in  bed Cardiovascular: 2/6 holosytolic murmur best heard over right upper sternal border, RRR; no JVD Chest: Lungs CTAB. No increased WOB. Pacemaker present over right upper chest.  Abdomen: +BS, mild tenderness to palpation with minimal guarding over LLQ (improved), no rebound  Extremities: WWP, moves all spontaneously, 2+ pitting edema to mid-shin bilaterally.   Laboratory:  Recent Labs Lab 04/07/15 0733 04/08/15 0826 04/10/15 0500  WBC 10.8* 11.7* 7.8  HGB 10.4* 10.7* 11.0*  HCT 33.8* 32.9* 33.4*  PLT 342 391 358    Recent Labs Lab 04/03/15 1435  04/08/15 0558 04/09/15 0546 04/10/15 0520  NA 140  < > 140 140 140  K 3.1*  < > 3.5 3.4* 3.4*  CL 98*  < > 105 106 104  CO2 31  < > 25 23 26   BUN 13  < > 17 14 10   CREATININE 1.11*  < > 1.01* 0.91 0.92  CALCIUM 9.1  < > 9.1 9.3 8.8*  PROT 6.8  --   --   --   --   BILITOT 0.7  --   --   --   --   ALKPHOS 69  --   --   --   --   ALT 24  --   --   --   --   AST 34  --   --   --   --   GLUCOSE 95  < > 109* 99 102*  < > = values in this interval not displayed.  Imaging/Diagnostic Tests: Ct Abdomen Pelvis Wo Contrast  04/03/2015  CLINICAL DATA:  Left-sided abdominal pain with nausea. EXAM: CT ABDOMEN AND PELVIS WITHOUT CONTRAST TECHNIQUE: Multidetector CT imaging of the abdomen and pelvis was performed following the standard protocol without IV contrast. COMPARISON:  03/08/2015.  08/06/2005. FINDINGS: Lower chest: Chronic interstitial changes in the lung bases. The heart is enlarged. Pericardial effusion is incompletely visualized and is stable to slightly increased in the interval, remaining small to moderate in size. Hepatobiliary: No focal abnormality in the liver on this study without intravenous contrast. No evidence of hepatomegaly. Gallbladder is surgically absent. No intrahepatic or extrahepatic biliary dilation. Pancreas: No focal mass lesion. No dilatation of the main duct. No intraparenchymal cyst. No peripancreatic edema.  Spleen: No splenomegaly. No focal mass lesion. Adrenals/Urinary Tract: No adrenal nodule or mass. 8 mm hyper attenuating lesion in the interpolar right kidney is stable. 13 mm hyper attenuating lesion in the interpolar right kidney is stable. 2.3 cm exophytic water density lesion in the lower pole the right kidney is unchanged remains compatible with a cyst. Stable appearing cyst in the interpolar left kidney. Apparent exophytic lesion upper pole left kidney is seen to represent normal renal parenchyma with focal cortical scarring on the previous CT scan from 08/06/2005. No evidence for hydroureter. The urinary bladder appears normal for the degree of distention. Stomach/Bowel: Stomach is nondistended. No gastric wall thickening. No evidence of outlet obstruction. Duodenum is normally positioned as is the ligament of Treitz. No small bowel wall thickening. No small bowel dilatation. The terminal ileum is normal. The appendix is normal. Left colonic diverticulosis is evident. Pericolonic edema/ inflammation is seen around the distal descending colon and proximal sigmoid segment, suggesting acute diverticulitis.  This pericolonic edema/inflammation is not substantially different than on the prior study and there is a stable small volume of fluid in the root of the sigmoid mesocolon. There is stable appearance of circumferential wall thickening in the mid sigmoid segment, likely related to muscular hypertrophy from underlying diverticulosis. Vascular/Lymphatic: There is abdominal aortic atherosclerosis without aneurysm. There is no gastrohepatic or hepatoduodenal ligament lymphadenopathy. No intraperitoneal or retroperitoneal lymphadenopathy. Small lymph nodes are identified in the sigmoid mesocolon. Reproductive: Uterus is surgically absent. There is no adnexal mass. Other: No intraperitoneal free fluid. Musculoskeletal: Bone windows reveal no worrisome lytic or sclerotic osseous lesions. IMPRESSION: 1. No substantial  interval change in the pericolonic edema/inflammation associated with the distal descending and proximal sigmoid colon. Imaging features are compatible with acute diverticulitis although the lack of interval change may be related to persistent or recurrent disease. No evidence for perforation or abscess. Given the persistence, underlying neoplastic involvement should be considered. 2. Bilateral renal cysts. High attenuation lesions in the right kidney may be related to proteinaceous or hemorrhagic cyst. Electronically Signed   By: Misty Stanley M.D.   On: 04/03/2015 17:54    Malyiah Fellows Corinda Gubler, MD 04/10/2015, 6:55 AM PGY-1, [place] Intern pager: [phone], text pages welcome

## 2015-04-10 NOTE — Progress Notes (Signed)
Patient refused to take metalazone, Dr Juleen China family medicine made aware.  Seriyah Collison, Tivis Ringer, RN

## 2015-04-10 NOTE — Discharge Summary (Signed)
[facility] Discharge Summary  Patient name: [name] Medical record number: [id number] Date of birth: [birth year] Age: 82 y.o. Gender: female Date of Admission: 04/03/2015  Date of Discharge: 04/11/2015 Admitting Physician: Leeanne Rio, MD  Primary Care Provider: [name], MD Consultants: General Surgery  Indication for Hospitalization: Abdominal pain with poor PO/Diverticulitis refractory to outpatient management  Discharge Diagnoses/Problem List:  Active Problems:   Acute Diverticulitis   C. diff   Left sided abdominal pain   Chronic anticoagulation - Coumadin, CHADS2VASC=7   Pacemaker   Hx CVA   Diabetes    Supratherapeutic INR   Paroxysmal atrial fibrillation    Essential hypertension, benign  Disposition: Home  Discharge Condition: Improved  Discharge Exam:  Blood pressure 118/89, pulse 60, temperature 98.1 F (36.7 C), temperature source Oral, resp. rate 17, height 5\' 5"  (1.651 m), weight 153 lb (69.4 kg), SpO2 95 %. General: Well-appearing female, sitting up in recliner Cardiovascular: 2/6 holosytolic murmur best heard over right upper sternal border, RRR; no JVD Chest: Lungs CTAB. No increased WOB. Pacemaker present over right upper chest.  Abdomen: +BS, minimal tenderness to palpation with slight guarding over LLQ (improved), no rebound  Extremities: WWP, moves all spontaneously, 2+ pitting edema to mid-shin bilaterally.   Brief Hospital Course:  [name] is a 82 y.o. female who presented with worsening abdominal pain, fevers, and poor PO. PMH is significant for atrial fibrillation, hx of CVA, hypertension, type 2 diabetes, tachycardia-bradycardia syndrome s/p pacemaker, prior pericardial effusion s/p pericardial window 12/2014, sleep apnea (no CPAP), GI bleeding with hemorrhoids, and arthritis. Of note, she had been hospitalized in February 2017 for diverticulitis and completed 2 courses of antibiotics  outpatient prior to presenting for this admission. CRP was elevated from last admission at 3.5, fitting with acute diverticulitis. Leukocytosis to 13.3 on admission. CT abdomen pelvis 04/03/2015 was negative for perforation or abscess but showed pericolonic edema and inflammation of distal descending and proximal sigmoid colon, similar to last CT. However, CT, exam and family history were concerning for colonic mass, so general surgery was consulted. Clinical picture most consistent with diverticulitis, but pain and inflammation seen on CT could also be secondary to C diff infection, which patient tested positive for on 04/09/15.  Initially, patient was started on IV zosyn, but this was switched to ertapenem and diflucan per surgery recommendations on 04/04/15 and continued for 6 days until she was switched to PO augmentin 3/15, to be continued through 04/17/15 to complete 14-day course. She was started on oral vancomycin 04/09/15 for C. diff. This should be continued for a week after augmentin is stopped, through 04/24/15. Patient was tolerating soft diet well by time of discharge with marked improvement in abdominal pain. Leukocytosis peaked at 16.1 on 04/04/15. She was instructed to continue a soft diet through 04/17/15 and to avoid high-fiber foods until she is feeling better. PT identified no needs for going home. Patient is to follow-up with general surgery and GI.   Patient's INR was followed and was supratherapeutic during course of antibiotic treatment, so coumadin was held upon discharge.  Home torsemide was held while patient received IVFs. This was restarted prior to discharge, but patient was up 5 lbs from her admission weight with LE edema. She had no SOB or crackles on lung exam at time of discharge. Home metolazone was not continued, as patient has not been taking it due to side effects (hair loss).   Issues for Follow Up:  1. She will need a colonoscopy in 6-8 weeks. Could consider biopsy to rule-out  cancer. Had been seen by Dr. Watt Climes of [facility] recently in the outpatient setting.  2. General Surgery to schedule follow-up, as interest in bowel resection would need to be revisited if she fails to improve.  3. Resolution of pain after antibiotic course and ability to tolerate a full diet.  4. INR was supratherapeutic during hospitalization, possibly 2/2 treatment with diflucan. Continue to monitor with frequent INR checks at Cardiologist's office until stabilizes.   Significant Procedures: None  Significant Labs and Imaging:   Recent Labs Lab 04/07/15 0733 04/08/15 0826 04/10/15 0500  WBC 10.8* 11.7* 7.8  HGB 10.4* 10.7* 11.0*  HCT 33.8* 32.9* 33.4*  PLT 342 391 358    Recent Labs Lab 04/06/15 0651 04/07/15 0733 04/08/15 0558 04/09/15 0546 04/10/15 0520  NA 140 140 140 140 140  K 4.1 3.5 3.5 3.4* 3.4*  CL 100* 103 105 106 104  CO2 27 26 25 23 26   GLUCOSE 93 93 109* 99 102*  BUN 21* 18 17 14 10   CREATININE 1.30* 1.07* 1.01* 0.91 0.92  CALCIUM 9.0 8.9 9.1 9.3 8.8*   Ct Abdomen Pelvis Wo Contrast  04/03/2015  CLINICAL DATA:  Left-sided abdominal pain with nausea. IMPRESSION: 1. No substantial interval change in the pericolonic edema/inflammation associated with the distal descending and proximal sigmoid colon. Imaging features are compatible with acute diverticulitis although the lack of interval change may be related to persistent or recurrent disease. No evidence for perforation or abscess. Given the persistence, underlying neoplastic involvement should be considered. 2. Bilateral renal cysts. High attenuation lesions in the right kidney may be related to proteinaceous or hemorrhagic cyst. Electronically Signed   By: Misty Stanley M.D.   On: 04/03/2015 17:54   Results/Tests Pending at Time of Discharge: None  Discharge Medications:    Medication List    ASK your doctor about these medications        Digoxin 62.5 MCG Tabs  Take 0.0625 mg by mouth daily.  Take only 5 days a week; skip Wednesdays and Sundays     hydrocortisone-pramoxine 2.5-1 % rectal cream  Commonly known as:  ANALPRAM-HC  Place 1 application rectally 3 (three) times daily.     metolazone 2.5 MG tablet  Commonly known as:  ZAROXOLYN  TAKE 1 TABLET 30 MINUTES PRIOR TO TORSEMIDE DOSE ON Wednesday AND Saturday.     ondansetron 4 MG disintegrating tablet  Commonly known as:  ZOFRAN ODT  Take 1 tablet (4 mg total) by mouth every 8 (eight) hours as needed for nausea or vomiting.     polyethylene glycol packet  Commonly known as:  MIRALAX / GLYCOLAX  Take 17 g by mouth daily.     potassium chloride 10 MEQ tablet  Commonly known as:  K-DUR,KLOR-CON  Take 20 mEq by mouth daily. Take with Torsemide     torsemide 20 MG tablet  Commonly known as:  DEMADEX  Take 3 tablets (60 mg total) by mouth daily.     verapamil 180 MG CR tablet  Commonly known as:  CALAN-SR  take 1 tablet by mouth at bedtime     warfarin 5 MG tablet  Commonly known as:  COUMADIN  Take 1 tablet by mouth daily or as directed by coumadin clinic        Discharge Instructions: Please refer to Patient Instructions section of EMR for full details.  Patient was counseled important signs and  symptoms that should prompt return to medical care, changes in medications, dietary instructions, activity restrictions, and follow up appointments.   Follow-Up Appointments: Follow-up Information    Schedule an appointment as soon as possible for a visit with Adin Hector, MD.   Specialty:  General Surgery   Why:  hospital follow-up with surgery in ~2 weeks, once completed augmentin   Contact information:   [address] [place] [address] [phone]       Schedule an appointment as soon as possible for a visit with [name], MD.   Specialty:  Family Medicine   Why:  hospital follow-up   Contact information:   [place] [id number] [phone]       Rogue Bussing, MD 04/10/2015, 11:12 PM PGY-1, [place]

## 2015-04-10 NOTE — Progress Notes (Signed)
Patient ID: [name], female   DOB: 07-05-1923, 82 y.o.   MRN: [id number]     [facility]      [address] [place], [place] [address]    Phone: [phone] FAX: [phone]     Subjective: "feel like human today." Diarrhea is slowing down.  Afebrile.  VSS.  No leukocytosis.  Tolerating soft diet.    Objective:  Vital signs:  Filed Vitals:   04/09/15 1514 04/09/15 2243 04/10/15 0541 04/10/15 0549  BP: 123/51 112/56  111/54  Pulse: 58 111  65  Temp: 98 F (36.7 C) 97.6 F (36.4 C)  97.7 F (36.5 C)  TempSrc: Oral Oral  Oral  Resp: '24 18  18  '$ Height:      Weight:   69.9 kg (154 lb 1.6 oz)   SpO2: 96% 95%  100%    Last BM Date: 04/09/15  Intake/Output   Yesterday:  03/14 0701 - 03/15 0700 In: 270 [P.O.:120; IV Piggyback:150] Out: -  This shift:    I/O last 3 completed shifts: In: 460 [P.O.:230; I.V.:80; IV Piggyback:150] Out: -     Physical Exam: General: Pt awake/alert/oriented x4 in no acute distress Abdomen: Soft. Nondistended. minimal ttp llq. No evidence of peritonitis. No incarcerated hernias.   Problem List:   Active Problems:   Chronic anticoagulation - Coumadin, CHADS2VASC=7   Pacemaker   Cerebral embolism with cerebral infarction ([place])   Diabetes ([place])   Diverticulitis   Left sided abdominal pain   Supratherapeutic INR   Paroxysmal atrial fibrillation ([facility])   Essential hypertension, benign    Results:   Labs: Results for orders placed or performed during the hospital encounter of 04/03/15 (from the past 48 hour(s))  Glucose, capillary     Status: Abnormal   Collection Time: 04/08/15 11:40 AM  Result Value Ref Range   Glucose-Capillary 105 (H) 65 - 99 mg/dL  Glucose, capillary     Status: Abnormal   Collection Time: 04/08/15  5:01 PM  Result Value Ref Range   Glucose-Capillary 120 (H) 65 - 99 mg/dL  C difficile quick scan w PCR reflex     Status: Abnormal   Collection  Time: 04/08/15  8:58 PM  Result Value Ref Range   C Diff antigen POSITIVE (A) NEGATIVE   C Diff toxin POSITIVE (A) NEGATIVE   C Diff interpretation Positive for toxigenic C. difficile     Comment: CRITICAL RESULT CALLED TO, READ BACK BY AND VERIFIED WITH: Lucky Rathke AT 2836 04/09/15 BY L BENFIELD   Glucose, capillary     Status: Abnormal   Collection Time: 04/08/15 10:16 PM  Result Value Ref Range   Glucose-Capillary 106 (H) 65 - 99 mg/dL   Comment 1 Notify RN    Comment 2 Document in Chart   Protime-INR     Status: Abnormal   Collection Time: 04/09/15  5:46 AM  Result Value Ref Range   Prothrombin Time 28.5 (H) 11.6 - 15.2 seconds   INR 2.73 (H) 0.00 - 6.29  Basic metabolic panel     Status: Abnormal   Collection Time: 04/09/15  5:46 AM  Result Value Ref Range   Sodium 140 135 - 145 mmol/L   Potassium 3.4 (L) 3.5 - 5.1 mmol/L   Chloride 106 101 - 111 mmol/L   CO2 23 22 - 32 mmol/L   Glucose, Bld 99 65 - 99 mg/dL   BUN 14 6 - 20 mg/dL  Creatinine, Ser 0.91 0.44 - 1.00 mg/dL   Calcium 9.3 8.9 - 10.3 mg/dL   GFR calc non Af Amer 54 (L) >60 mL/min   GFR calc Af Amer >60 >60 mL/min    Comment: (NOTE) The eGFR has been calculated using the CKD EPI equation. This calculation has not been validated in all clinical situations. eGFR's persistently <60 mL/min signify possible Chronic Kidney Disease.    Anion gap 11 5 - 15  Glucose, capillary     Status: None   Collection Time: 04/09/15  8:09 AM  Result Value Ref Range   Glucose-Capillary 97 65 - 99 mg/dL  Glucose, capillary     Status: None   Collection Time: 04/09/15 11:50 AM  Result Value Ref Range   Glucose-Capillary 99 65 - 99 mg/dL  Glucose, capillary     Status: None   Collection Time: 04/09/15  5:17 PM  Result Value Ref Range   Glucose-Capillary 97 65 - 99 mg/dL  Glucose, capillary     Status: None   Collection Time: 04/09/15 10:41 PM  Result Value Ref Range   Glucose-Capillary 98 65 - 99 mg/dL  Protime-INR      Status: Abnormal   Collection Time: 04/10/15  5:00 AM  Result Value Ref Range   Prothrombin Time 33.8 (H) 11.6 - 15.2 seconds   INR 3.42 (H) 0.00 - 1.49  CBC     Status: Abnormal   Collection Time: 04/10/15  5:00 AM  Result Value Ref Range   WBC 7.8 4.0 - 10.5 K/uL   RBC 3.84 (L) 3.87 - 5.11 MIL/uL   Hemoglobin 11.0 (L) 12.0 - 15.0 g/dL   HCT 33.4 (L) 36.0 - 46.0 %   MCV 87.0 78.0 - 100.0 fL   MCH 28.6 26.0 - 34.0 pg   MCHC 32.9 30.0 - 36.0 g/dL   RDW 14.2 11.5 - 15.5 %   Platelets 358 150 - 400 K/uL  Basic metabolic panel     Status: Abnormal   Collection Time: 04/10/15  5:20 AM  Result Value Ref Range   Sodium 140 135 - 145 mmol/L   Potassium 3.4 (L) 3.5 - 5.1 mmol/L   Chloride 104 101 - 111 mmol/L   CO2 26 22 - 32 mmol/L   Glucose, Bld 102 (H) 65 - 99 mg/dL   BUN 10 6 - 20 mg/dL   Creatinine, Ser 0.92 0.44 - 1.00 mg/dL   Calcium 8.8 (L) 8.9 - 10.3 mg/dL   GFR calc non Af Amer 53 (L) >60 mL/min   GFR calc Af Amer >60 >60 mL/min    Comment: (NOTE) The eGFR has been calculated using the CKD EPI equation. This calculation has not been validated in all clinical situations. eGFR's persistently <60 mL/min signify possible Chronic Kidney Disease.    Anion gap 10 5 - 15  Glucose, capillary     Status: None   Collection Time: 04/10/15  8:05 AM  Result Value Ref Range   Glucose-Capillary 95 65 - 99 mg/dL    Imaging / Studies: No results found.  Medications / Allergies:  Scheduled Meds: . acetaminophen  650 mg Oral 4 times per day  . digoxin  0.0625 mg Oral Daily  . ertapenem  1 g Intravenous Q24H  . fluconazole (DIFLUCAN) IV  200 mg Intravenous Q24H  . insulin aspart  0-9 Units Subcutaneous TID WC  . lactobacillus acidophilus & bulgar  1 tablet Oral TID WC  . sodium chloride flush  3 mL Intravenous  Q12H  . torsemide  60 mg Oral Daily  . vancomycin  125 mg Oral QID  . verapamil  180 mg Oral QHS  . Warfarin - Pharmacist Dosing Inpatient   Does not apply q1800    Continuous Infusions: . sodium chloride 10 mL/hr at 04/07/15 1153   PRN Meds:.morphine injection, ondansetron **OR** ondansetron (ZOFRAN) IV  Antibiotics: Anti-infectives    Start     Dose/Rate Route Frequency Ordered Stop   04/09/15 0130  vancomycin (VANCOCIN) 50 mg/mL oral solution 125 mg     125 mg Oral 4 times daily 04/09/15 0114 04/22/15 2159   04/08/15 1300  ertapenem (INVANZ) 1 g in sodium chloride 0.9 % 50 mL IVPB     1 g 100 mL/hr over 30 Minutes Intravenous Every 24 hours 04/07/15 1232     04/04/15 1300  ertapenem (INVANZ) 0.5 g in sodium chloride 0.9 % 50 mL IVPB  Status:  Discontinued     500 mg 100 mL/hr over 30 Minutes Intravenous Every 24 hours 04/04/15 1218 04/07/15 1232   04/04/15 1300  fluconazole (DIFLUCAN) IVPB 200 mg     200 mg 100 mL/hr over 60 Minutes Intravenous Every 24 hours 04/04/15 1218     04/03/15 2100  piperacillin-tazobactam (ZOSYN) IVPB 3.375 g  Status:  Discontinued     3.375 g 12.5 mL/hr over 240 Minutes Intravenous Every 8 hours 04/03/15 2042 04/04/15 1218   04/03/15 1500  piperacillin-tazobactam (ZOSYN) IVPB 3.375 g     3.375 g 100 mL/hr over 30 Minutes Intravenous  Once 04/03/15 1448 04/03/15 1640         Assessment/Plan Sigmoid diverticulitis-clinically improved.  WBC normal.  Change to PO antibiotics; augmentin or cipro/flagyl for a total of 14 days of therapy.  CBC in AM.  If she remains stable today, discharge home tomorrow with OP follow up with Dr. Corine Shelter D#6 Fluconazole D#6. Vanc PO for c diff.     Erby Pian, [facility] Pager [phone]) For consults and floor pages call [phone]([id number])  04/10/2015 9:27 AM

## 2015-04-11 LAB — GLUCOSE, CAPILLARY
Glucose-Capillary: 104 mg/dL — ABNORMAL HIGH (ref 65–99)
Glucose-Capillary: 113 mg/dL — ABNORMAL HIGH (ref 65–99)

## 2015-04-11 LAB — CBC
HCT: 33 % — ABNORMAL LOW (ref 36.0–46.0)
Hemoglobin: 10.7 g/dL — ABNORMAL LOW (ref 12.0–15.0)
MCH: 28.4 pg (ref 26.0–34.0)
MCHC: 32.4 g/dL (ref 30.0–36.0)
MCV: 87.5 fL (ref 78.0–100.0)
PLATELETS: 364 10*3/uL (ref 150–400)
RBC: 3.77 MIL/uL — ABNORMAL LOW (ref 3.87–5.11)
RDW: 14.1 % (ref 11.5–15.5)
WBC: 8.4 10*3/uL (ref 4.0–10.5)

## 2015-04-11 LAB — BASIC METABOLIC PANEL
Anion gap: 14 (ref 5–15)
BUN: 13 mg/dL (ref 6–20)
CALCIUM: 8.5 mg/dL — AB (ref 8.9–10.3)
CO2: 26 mmol/L (ref 22–32)
CREATININE: 0.99 mg/dL (ref 0.44–1.00)
Chloride: 100 mmol/L — ABNORMAL LOW (ref 101–111)
GFR calc Af Amer: 56 mL/min — ABNORMAL LOW (ref >60)
GFR, EST NON AFRICAN AMERICAN: 49 mL/min — AB (ref >60)
GLUCOSE: 107 mg/dL — AB (ref 65–99)
Potassium: 3.6 mmol/L (ref 3.5–5.1)
Sodium: 140 mmol/L (ref 135–145)

## 2015-04-11 LAB — PROTIME-INR
INR: 3.78 — ABNORMAL HIGH (ref 0.00–1.49)
Prothrombin Time: 36.4 seconds — ABNORMAL HIGH (ref 11.6–15.2)

## 2015-04-11 MED ORDER — AMOXICILLIN-POT CLAVULANATE 875-125 MG PO TABS: 1.0000 | ORAL_TABLET | Freq: Two times a day (BID) | ORAL | Status: AC

## 2015-04-11 MED ORDER — POTASSIUM CHLORIDE CRYS ER 10 MEQ PO TBCR: 20.0000 meq | EXTENDED_RELEASE_TABLET | Freq: Two times a day (BID) | ORAL | Status: DC

## 2015-04-11 MED ORDER — SACCHAROMYCES BOULARDII 250 MG PO CHEW: 500.0000 mg | CHEWABLE_TABLET | Freq: Two times a day (BID) | ORAL | Status: AC

## 2015-04-11 MED ORDER — VANCOMYCIN 50 MG/ML ORAL SOLUTION: 125.0000 mg | Freq: Four times a day (QID) | ORAL | Status: AC

## 2015-04-11 MED ORDER — AMOXICILLIN-POT CLAVULANATE 875-125 MG PO TABS: 1.0000 | ORAL_TABLET | Freq: Two times a day (BID) | ORAL | Status: DC

## 2015-04-11 MED FILL — VANCOMYCIN 50MG/ML ORAL SOL: 50MG/1ML | 14 days supply | Qty: 200 | Fill #0

## 2015-04-11 MED FILL — AMOX TR-K CLV 875-125 MG TA: 875-125 | 6 days supply | Qty: 13 | Fill #0

## 2015-04-11 NOTE — Plan of Care (Signed)
Problem: Food- and Nutrition-Related Knowledge Deficit (NB-1.1) Goal: Nutrition education Formal process to instruct or train a patient/client in a skill or to impart knowledge to help patients/clients voluntarily manage or modify food choices and eating behavior to maintain or improve health. Outcome: Adequate for Discharge Nutrition Education Note  RD consulted for nutrition education regarding diverticulitis.  Pt reports that she will likely discharge home today. 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   RD provided "Low Fiber Nutrition Therapy" handout from the Academy of [employer]. Reviewed patient's dietary recall. Provided examples on ways to decrease fiber intake in diet. Discouraged intake of high fiber foods and whole grains. Discouraged fresh fruits and vegetables as well as whole grain sources of carbohydrates to maximize fiber intake.   RD discussed why it is important for patient to adhere to diet recommendations, and emphasized foods to avoid. Teach back method used.  Expect good compliance.  Body mass index is 25.46 kg/(m^2). Pt meets criteria for overweight based on current BMI.  Current diet order is soft, patient is consuming approximately 75-100% of meals at this time. Labs and medications reviewed. No further nutrition interventions warranted at this time. RD contact information provided. If additional nutrition issues arise, please re-consult RD.   Zali Kamaka A. Jimmye Norman, RD, LDN, CDE Pager: [phone] After hours Pager: [phone]

## 2015-04-11 NOTE — Care Management Note (Signed)
Case Management Note  Patient Details  Name: [name] MRN: [id number] Date of Birth: March 23, 1923  Subjective/Objective:                    Action/Plan: Plan to d/c to home today. No further needs identified per CM.  Expected Discharge Date:       04/11/2015           Expected Discharge Plan:  Home/Self Care  In-House Referral:     Discharge planning Services  CM Consult  Post Acute Care Choice:    Choice offered to:     DME Arranged:    DME Agency:     HH Arranged:    HH Agency:     Status of Service:   Medicare Important Message Given:  Yes Date Medicare IM Given:    Medicare IM give by:    Date Additional Medicare IM Given:    Additional Medicare Important Message give by:     If discussed at [place] of Stay Meetings, dates discussed:    Additional Comments:  [name], [place] [phone] 04/11/2015, 10:15 AM

## 2015-04-11 NOTE — Progress Notes (Signed)
Patient ID: [name], female   DOB: 1923-05-09, 82 y.o.   MRN: [id number]     [address] [place], [place] [address]    Phone: [phone] FAX: [phone]     Subjective: ~8 bms overnight. No n/v.  Tolerating diet.  "twinge" of pain at times. Not requiring pain meds.  Afebrile.  WBC normal.   Objective:  Vital signs:  Filed Vitals:   04/10/15 2159 04/11/15 0443 04/11/15 0552 04/11/15 0929  BP: 104/59  118/89   Pulse: 70  61 60  Temp: 98.4 F (36.9 C)  98.1 F (36.7 C)   TempSrc: Oral  Oral   Resp: 16  17   Height:      Weight:  69.4 kg (153 lb)    SpO2: 92%  95%     Last BM Date: 04/11/15  Intake/Output   Yesterday:  03/15 0701 - 03/16 0700 In: 3 [I.V.:3] Out: -  This shift: I/O last 3 completed shifts: In: 3 [I.V.:3] Out: -      Physical Exam: General: Pt awake/alert/oriented x4 in no acute distress Abdomen: Soft. Nondistended. non tender. No evidence of peritonitis. No incarcerated hernias.  Problem List:   Active Problems:   Chronic anticoagulation - Coumadin, CHADS2VASC=7   Pacemaker   Cerebral embolism with cerebral infarction ([place])   Diabetes ([place])   Diverticulitis   Left sided abdominal pain   Supratherapeutic INR   Paroxysmal atrial fibrillation ([facility])   Essential hypertension, benign    Results:   Labs: Results for orders placed or performed during the hospital encounter of 04/03/15 (from the past 48 hour(s))  Glucose, capillary     Status: None   Collection Time: 04/09/15 11:50 AM  Result Value Ref Range   Glucose-Capillary 99 65 - 99 mg/dL  Glucose, capillary     Status: None   Collection Time: 04/09/15  5:17 PM  Result Value Ref Range   Glucose-Capillary 97 65 - 99 mg/dL  Glucose, capillary     Status: None   Collection Time: 04/09/15 10:41 PM  Result Value Ref Range   Glucose-Capillary 98 65 - 99 mg/dL  Protime-INR     Status: Abnormal   Collection  Time: 04/10/15  5:00 AM  Result Value Ref Range   Prothrombin Time 33.8 (H) 11.6 - 15.2 seconds   INR 3.42 (H) 0.00 - 1.49  CBC     Status: Abnormal   Collection Time: 04/10/15  5:00 AM  Result Value Ref Range   WBC 7.8 4.0 - 10.5 K/uL   RBC 3.84 (L) 3.87 - 5.11 MIL/uL   Hemoglobin 11.0 (L) 12.0 - 15.0 g/dL   HCT 33.4 (L) 36.0 - 46.0 %   MCV 87.0 78.0 - 100.0 fL   MCH 28.6 26.0 - 34.0 pg   MCHC 32.9 30.0 - 36.0 g/dL   RDW 14.2 11.5 - 15.5 %   Platelets 358 150 - 400 K/uL  Basic metabolic panel     Status: Abnormal   Collection Time: 04/10/15  5:20 AM  Result Value Ref Range   Sodium 140 135 - 145 mmol/L   Potassium 3.4 (L) 3.5 - 5.1 mmol/L   Chloride 104 101 - 111 mmol/L   CO2 26 22 - 32 mmol/L   Glucose, Bld 102 (H) 65 - 99 mg/dL   BUN 10 6 - 20 mg/dL   Creatinine, Ser 0.92 0.44 - 1.00 mg/dL  Calcium 8.8 (L) 8.9 - 10.3 mg/dL   GFR calc non Af Amer 53 (L) >60 mL/min   GFR calc Af Amer >60 >60 mL/min    Comment: (NOTE) The eGFR has been calculated using the CKD EPI equation. This calculation has not been validated in all clinical situations. eGFR's persistently <60 mL/min signify possible Chronic Kidney Disease.    Anion gap 10 5 - 15  Glucose, capillary     Status: None   Collection Time: 04/10/15  8:05 AM  Result Value Ref Range   Glucose-Capillary 95 65 - 99 mg/dL  Glucose, capillary     Status: Abnormal   Collection Time: 04/10/15 12:08 PM  Result Value Ref Range   Glucose-Capillary 116 (H) 65 - 99 mg/dL  Glucose, capillary     Status: Abnormal   Collection Time: 04/10/15  5:22 PM  Result Value Ref Range   Glucose-Capillary 117 (H) 65 - 99 mg/dL  Glucose, capillary     Status: None   Collection Time: 04/10/15  9:56 PM  Result Value Ref Range   Glucose-Capillary 94 65 - 99 mg/dL  Protime-INR     Status: Abnormal   Collection Time: 04/11/15  6:15 AM  Result Value Ref Range   Prothrombin Time 36.4 (H) 11.6 - 15.2 seconds   INR 3.78 (H) 0.00 - 1.49  CBC      Status: Abnormal   Collection Time: 04/11/15  6:15 AM  Result Value Ref Range   WBC 8.4 4.0 - 10.5 K/uL   RBC 3.77 (L) 3.87 - 5.11 MIL/uL   Hemoglobin 10.7 (L) 12.0 - 15.0 g/dL   HCT 33.0 (L) 36.0 - 46.0 %   MCV 87.5 78.0 - 100.0 fL   MCH 28.4 26.0 - 34.0 pg   MCHC 32.4 30.0 - 36.0 g/dL   RDW 14.1 11.5 - 15.5 %   Platelets 364 150 - 400 K/uL  Basic metabolic panel     Status: Abnormal   Collection Time: 04/11/15  6:15 AM  Result Value Ref Range   Sodium 140 135 - 145 mmol/L   Potassium 3.6 3.5 - 5.1 mmol/L   Chloride 100 (L) 101 - 111 mmol/L   CO2 26 22 - 32 mmol/L   Glucose, Bld 107 (H) 65 - 99 mg/dL   BUN 13 6 - 20 mg/dL   Creatinine, Ser 0.99 0.44 - 1.00 mg/dL   Calcium 8.5 (L) 8.9 - 10.3 mg/dL   GFR calc non Af Amer 49 (L) >60 mL/min   GFR calc Af Amer 56 (L) >60 mL/min    Comment: (NOTE) The eGFR has been calculated using the CKD EPI equation. This calculation has not been validated in all clinical situations. eGFR's persistently <60 mL/min signify possible Chronic Kidney Disease.    Anion gap 14 5 - 15  Glucose, capillary     Status: Abnormal   Collection Time: 04/11/15  7:44 AM  Result Value Ref Range   Glucose-Capillary 104 (H) 65 - 99 mg/dL    Imaging / Studies: No results found.  Medications / Allergies:  Scheduled Meds: . acetaminophen  650 mg Oral 4 times per day  . amoxicillin-clavulanate  1 tablet Oral Q12H  . digoxin  0.0625 mg Oral Daily  . insulin aspart  0-9 Units Subcutaneous TID WC  . lactobacillus acidophilus & bulgar  1 tablet Oral TID WC  . metolazone  2.5 mg Oral Once  . potassium chloride  20 mEq Oral BID  . sodium chloride flush  3 mL Intravenous Q12H  . torsemide  60 mg Oral Daily  . vancomycin  125 mg Oral QID  . verapamil  180 mg Oral QHS  . Warfarin - Pharmacist Dosing Inpatient   Does not apply q1800   Continuous Infusions: . sodium chloride 10 mL/hr at 04/07/15 1153   PRN Meds:.morphine injection, ondansetron **OR**  ondansetron (ZOFRAN) IV  Antibiotics: Anti-infectives    Start     Dose/Rate Route Frequency Ordered Stop   04/10/15 1300  amoxicillin-clavulanate (AUGMENTIN) 875-125 MG per tablet 1 tablet     1 tablet Oral Every 12 hours 04/10/15 1223     04/09/15 0130  vancomycin (VANCOCIN) 50 mg/mL oral solution 125 mg     125 mg Oral 4 times daily 04/09/15 0114 04/22/15 2159   04/08/15 1300  ertapenem (INVANZ) 1 g in sodium chloride 0.9 % 50 mL IVPB  Status:  Discontinued     1 g 100 mL/hr over 30 Minutes Intravenous Every 24 hours 04/07/15 1232 04/10/15 1208   04/04/15 1300  ertapenem (INVANZ) 0.5 g in sodium chloride 0.9 % 50 mL IVPB  Status:  Discontinued     500 mg 100 mL/hr over 30 Minutes Intravenous Every 24 hours 04/04/15 1218 04/07/15 1232   04/04/15 1300  fluconazole (DIFLUCAN) IVPB 200 mg  Status:  Discontinued     200 mg 100 mL/hr over 60 Minutes Intravenous Every 24 hours 04/04/15 1218 04/11/15 0740   04/03/15 2100  piperacillin-tazobactam (ZOSYN) IVPB 3.375 g  Status:  Discontinued     3.375 g 12.5 mL/hr over 240 Minutes Intravenous Every 8 hours 04/03/15 2042 04/04/15 1218   04/03/15 1500  piperacillin-tazobactam (ZOSYN) IVPB 3.375 g     3.375 g 100 mL/hr over 30 Minutes Intravenous  Once 04/03/15 1448 04/03/15 1640         Assessment/Plan Sigmoid diverticulitis-clinically resolved.  Tolerating Augmentin.  Total 14 days of therapy.  Our office will schedule a follow up.  She will need a colonoscopy 6-8 weeks.  Stable for discharge from a surgical standpoint.    Erby Pian, [facility] Surgery Pager [phone]) For consults and floor pages call [phone])  04/11/2015 10:24 AM

## 2015-04-11 NOTE — Progress Notes (Signed)
ANTICOAGULATION CONSULT NOTE - Follow-up Consult  Pharmacy Consult for Warfarin Indication: atrial fibrillation  Patient Measurements: Height: 5\' 5"  (165.1 cm) Weight: 153 lb (69.4 kg) IBW/kg (Calculated) : 57  Vital Signs: Temp: 98.1 F (36.7 C) (03/16 0552) Temp Source: Oral (03/16 0552) BP: 118/89 mmHg (03/16 0552) Pulse Rate: 60 (03/16 0929)  Labs:  Recent Labs  04/09/15 0546 04/10/15 0500 04/10/15 0520 04/11/15 0615  HGB  --  11.0*  --  10.7*  HCT  --  33.4*  --  33.0*  PLT  --  358  --  364  LABPROT 28.5* 33.8*  --  36.4*  INR 2.73* 3.42*  --  3.78*  CREATININE 0.91  --  0.92 0.99    Assessment: 82 yo female presenting with abdominal pain. Continues on warfarin for AFib, but has had interruptions to dosing d/t addition of fluconazole and abx. See below:  Date 3/8 3/9 3/10 3/11 3/12 3/13 3/14 3/15 3/16  INR 2.07 2.52 4.57 7.54 2.96 2.01 2.73 3.42 3.78  Warfarin dose 2.5 mg  5 mg HELD HELD 2.5 mg PO VitK 2 mg 2.5mg  HELD HELD HELD  * fluconazole started 03/9, last dose given 3/15*  PTA dose: 5 mg on Sun/Tue/Thu and 2.5 mg AOD's per clinic notes  Hgb and plts stable, no bleeding noted.  Clinically improving.  Changed to PO Augmentin for acute sigmoid diverticulitis.  Goal of Therapy:  INR 2-3 Monitor platelets by anticoagulation protocol: Yes   Plan:  -anticipate discharge 3/16- recommend holding warfarin dose on 3/16 and 3/17, then resuming on 3/18 with dose of 2.5mg  daily with an INR check on 3/20 or 3/21 -daily INR, CBC tomorrow if still here  Murriel Eidem D. Hania Cerone, [name], BCPS Clinical Pharmacist Pager: [phone] 04/11/2015 10:46 AM

## 2015-04-11 NOTE — Progress Notes (Signed)
Family Medicine Teaching Service Daily Progress Note Intern Pager: [phone]  Patient name: [name] Medical record number: [id number] Date of birth: May 27, 1923 Age: 82 y.o. Gender: female  Primary Care Provider: [name], MD Consultants: Surgery Code Status: Full  Pt Overview and Major Events to Date:  Admitted 04/03/15: started on Zosyn 3/9: switched to Ertapenem and Diflucan per surgery  3/14: C diff positive. Added PO vancomycin.  3/15: Transitioned to PO abx, to take through 04/17/15  Assessment and Plan: [name] is a 82 y.o. female presenting with abdominal pain. PMH is significant for atrial fibrillation, hx of CVA, hypertension, type 2 diabetes, tachycardia-bradycardia syndrome s/p pacemaker, prior pericardial effusion s/p pericardial window 12/16, sleep apnea (no CPAP), GI bleeding with hemorrhoids, and arthritis. Pain is likely due to recurrent vs. unresolved diverticulitis. CRP was elevated from last admission at 3.5, fitting with acute diverticulitis. However, CT, exam and family history concerning for colonic mass. Surgery consulting and believes picture most consistent with diverticulitis. Pain and inflammation seen on imaging could also be 2/2 C diff infection.  #Left Lower Quadrant Abdominal pain, overall improving: Stable vitals. Leukocytosis peaked at 16.1, improving. Afebrile for 5 days.  - Consulted general surgery for concern for need for resection. Appreciate recommendations. They recommend 14 day course of abx. Day 8 today. Transitioned to PO augmentin 04/10/15. Continue soft diet until complete 14-day abx course.  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 Long-term history of rectal bleeding. With history of hemorrhoids. Has been stable.  - FOBT negative in ED  - Monitor for GI bleed   # Atrial fibrillation: ECHO EF 0000000, no diastolic dysfunction. - Continue Verapamil and Digoxin  - coumadin per pharmacy   # Elevated INR: S/p Vitamin K 2.5g.  - 3.78 today (04/11/15)  # Type 2 diabetes mellitus: A1C 6.2, CBGs 90s/low 100s  - SSI - Continue ACQHS  #Lower extremity edema: Patient says she usually wears compression hose. Documented history of CHF but ECHO in January 2017 w/ normal EF and no diastolic dysfunction, some moderate valvular disease. Has stasis dermatitis of lower legs. Pitting edema to mid shin bilaterally. Bibasilar crackles absent.  - Continue home torsemide 60 mg daily. Refuses metolazone, has not taken for about 1 month.  - [place] I/Os --> 6x urine - Wrap legs with ace bandages, per patient request - Daily weights: 153lb today (down 1 lb since yesterday, but still up 5 lbs from admission) - KVO'ed fluids 3/12  FEN/GI: NS KVO; soft diet Prophylaxis: Coumadin   Disposition: Admitted on telemetry, home pending clinical improvement. Likely discharge today, 04/11/15, as tolerated transition to PO.  Subjective:  Continues to have loose stools but improving. Abdominal pain also greatly improved,  now only feels "twinges." She reports that she "feels human again." Was able to walk halls with walker yesterday without issue. Has walker at home.  Objective: Temp:  [98.1 F (36.7 C)-98.4 F (36.9 C)] 98.1 F (36.7  C) (03/16 0552) Pulse Rate:  [61-85] 61 (03/16 0552) Resp:  [16-19] 17 (03/16 0552) BP: (104-127)/(59-89) 118/89 mmHg (03/16 0552) SpO2:  [92 %-95 %] 95 % (03/16 0552) Weight:  [153 lb (69.4 kg)] 153 lb (69.4 kg) (03/16 0443) Physical Exam: General: Well-appearing female, sitting up in recliner Cardiovascular: 2/6 holosytolic murmur best heard over right upper sternal border, RRR; no JVD Chest: Lungs CTAB. No increased WOB. Pacemaker present over right upper chest.  Abdomen: +BS, minimal tenderness to palpation with slight guarding over LLQ (improved), no rebound  Extremities: WWP, moves all spontaneously, 2+ pitting edema to mid-shin bilaterally.   Laboratory:  Recent Labs Lab 04/07/15 0733 04/08/15 0826 04/10/15 0500  WBC 10.8* 11.7* 7.8  HGB 10.4* 10.7* 11.0*  HCT 33.8* 32.9* 33.4*  PLT 342 391 358    Recent Labs Lab 04/08/15 0558 04/09/15 0546 04/10/15 0520  NA 140 140 140  K 3.5 3.4* 3.4*  CL 105 106 104  CO2 25 23 26   BUN 17 14 10   CREATININE 1.01* 0.91 0.92  CALCIUM 9.1 9.3 8.8*  GLUCOSE 109* 99 102*    Imaging/Diagnostic Tests: Ct Abdomen Pelvis Wo Contrast  04/03/2015  CLINICAL DATA:  Left-sided abdominal pain with nausea. IMPRESSION: 1. No substantial interval change in the pericolonic edema/inflammation associated with the distal descending and proximal sigmoid colon. Imaging features are compatible with acute diverticulitis although the lack of interval change may be related to persistent or recurrent disease. No evidence for perforation or abscess. Given the persistence, underlying neoplastic involvement should be considered. 2. Bilateral renal cysts. High attenuation lesions in the right kidney may be related to proteinaceous or hemorrhagic cyst. Electronically Signed   By: Misty Stanley M.D.   On: 04/03/2015 17:54    Shawntina Diffee Corinda Gubler, MD 04/11/2015, 7:00 AM PGY-1, [place] Intern pager: [phone], text pages welcome

## 2015-04-11 NOTE — Progress Notes (Signed)
Nsg Discharge Note  Admit Date:  04/03/2015 Discharge date: 04/11/2015   [name] to be D/C'd Home per MD order.  AVS completed.  Copy for chart, and copy for patient signed, and dated. Patient/caregiver able to verbalize understanding.  Discharge Medication:   Medication List    STOP taking these medications        metolazone 2.5 MG tablet  Commonly known as:  ZAROXOLYN     polyethylene glycol packet  Commonly known as:  MIRALAX / GLYCOLAX     warfarin 5 MG tablet  Commonly known as:  COUMADIN      TAKE these medications        amoxicillin-clavulanate 875-125 MG tablet  Commonly known as:  AUGMENTIN  Take 1 tablet by mouth every 12 (twelve) hours.     Digoxin 62.5 MCG Tabs  Take 0.0625 mg by mouth daily. Take only 5 days a week; skip Wednesdays and Sundays     hydrocortisone-pramoxine 2.5-1 % rectal cream  Commonly known as:  ANALPRAM-HC  Place 1 application rectally 3 (three) times daily.     ondansetron 4 MG disintegrating tablet  Commonly known as:  ZOFRAN ODT  Take 1 tablet (4 mg total) by mouth every 8 (eight) hours as needed for nausea or vomiting.     potassium chloride 10 MEQ tablet  Commonly known as:  K-DUR,KLOR-CON  Take 2 tablets (20 mEq total) by mouth 2 (two) times daily. Take with Torsemide     Saccharomyces boulardii 250 MG Chew  Chew 500 mg by mouth 2 (two) times daily.     torsemide 20 MG tablet  Commonly known as:  DEMADEX  Take 3 tablets (60 mg total) by mouth daily.     vancomycin 50 mg/mL oral solution  Commonly known as:  VANCOCIN  Take 2.5 mLs (125 mg total) by mouth every 6 (six) hours.     verapamil 180 MG CR tablet  Commonly known as:  CALAN-SR  take 1 tablet by mouth at bedtime        Discharge Assessment: Filed Vitals:   04/11/15 0552 04/11/15 0929  BP: 118/89   Pulse: 61 60  Temp: 98.1 F (36.7 C)   Resp: 17    Skin clean, dry and intact without evidence of skin break down, no evidence of skin tears noted. IV  catheter discontinued intact. Site without signs and symptoms of complications - no redness or edema noted at insertion site, patient denies c/o pain - only slight tenderness at site.  Dressing with slight pressure applied.  D/c Instructions-Education: Discharge instructions given to patient/family with verbalized understanding. D/c education completed with patient/family including follow up instructions, medication list, d/c activities limitations if indicated, with other d/c instructions as indicated by MD - patient able to verbalize understanding, all questions fully answered. Patient instructed to return to ED, call 911, or call MD for any changes in condition.  Patient escorted via [place], and D/C home via private auto.  Dayle Points, RN 04/11/2015 1:51 PM

## 2015-04-11 NOTE — Discharge Instructions (Signed)
[name], [name] were hospitalized to received IV antibiotics for acute diverticulitis. You will be discharged with a prescription for augmentin to complete a 14-day course of antibiotics for your diverticulitis, through 04/17/15. You also tested positive for Clostridium difficile, so you will need to continue taking vancomycin by mouth every 6 hours through 04/24/15. This is a special form of vancomycin that has to be compounded. Your regular pharmacy ([facility] on [address]) does not carry the vancomycin, so it was called in to the [facility] right near the hospital by [place] facility. It will be ready this afternoon for you to pick-up. This pharmacy's phone number is [phone],  Address: [facility], [place], [place], [place] [address]. All other prescriptions were sent to [facility] on [address].   Please continue a soft diet through 04/17/15. Eating low fiber foods while you recover will help with abdominal pain. Examples of foods include canned or cooked fruits without skin or seeds. Canned or cooked vegetables such as green beans, carrots and potatoes (without the skin). Eggs, fish and poultry.  Please do not take your coumadin until instructed by your Cardiologist's office. Your levels were too high. We have an appointment to get your INR checked tomorrow, 04/12/15, at 11:20 a.m.  I have also prescribed a probiotic to take until you complete your course of antibiotics. If this prescription is too expensive, please take over the counter probiotics.    Clostridium Difficile Infection Clostridium difficile (C. difficile or C. diff) is a germ found in the intestines. C. difficile infection can happen after taking antibiotic medicines. Antiobiotics may cause the C. difficile to grow out of control and make a toxin that causes the infection. C. difficile infection can cause watery poop (diarrhea) or severe disease. HOME  CARE  Drink enough fluids to keep your pee (urine) clear or pale yellow. Avoid milk, caffeine, and alcohol.  Ask your doctor how to replace the body fluids you lost (rehydrate).  Eat small meals often. Avoid eating large meals.  Take your antibiotics as told. Finish them even if you start to feel better.  Do not  take medicines that help watery poop. These medicines may stop you from healing as fast or cause problems.  Wash your hands after using the bathroom and before touching food. Make sure people who live with you wash their hands often.  Clean all surfaces in your home. Use a product that has chlorine bleach in it. GET HELP IF:  Your watery poop lasts longer than expected.  Your watery poop comes back after you finish your antibiotic medicine.  You feel very dry or thirsty (dehydrated).  You have a fever. GET HELP RIGHT AWAY IF:   You have more stomach pain or tenderness.  You have blood in your poop (stool). Your poop may look black and tar-like.  You cannot eat or drink without throwing up (vomiting).   This information is not intended to replace advice given to you by your health care provider. Make sure you discuss any questions you have with your health care provider.   Document Released: 11/09/2008 Document Revised: 02/02/2014 Document Reviewed: 07/16/2014 Elsevier Interactive Patient Education [employer].

## 2015-04-12 ENCOUNTER — Ambulatory Visit: Payer: Medicare Other | Admitting: Pharmacist Clinician (PhC)/ Clinical Pharmacy Specialist

## 2015-04-12 ENCOUNTER — Other Ambulatory Visit: Payer: Self-pay | Admitting: *Deleted

## 2015-04-12 ENCOUNTER — Ambulatory Visit (INDEPENDENT_AMBULATORY_CARE_PROVIDER_SITE_OTHER): Payer: Medicare Other | Admitting: Pharmacist Clinician (PhC)/ Clinical Pharmacy Specialist

## 2015-04-12 DIAGNOSIS — Z7901 Long term (current) use of anticoagulants: Secondary | ICD-10-CM

## 2015-04-12 DIAGNOSIS — I480 Paroxysmal atrial fibrillation: Secondary | ICD-10-CM

## 2015-04-12 DIAGNOSIS — I6310 Cerebral infarction due to embolism of unspecified precerebral artery: Secondary | ICD-10-CM

## 2015-04-12 LAB — POCT INR: INR: 3.2

## 2015-04-12 NOTE — Patient Outreach (Signed)
[facility]) Care Management  04/12/2015  [name] 10-Dec-1923 [id number]   Assessment: Transition of care- Initial call Referral received from hospital liaison Eliott Nine) for transition of care calls and assess for home visits. 82 year old female with recent hospitalization on 3/8- 04/11/15 for left sided abdominal pain (diverticulitis) and heart failure.  Call placed and spoke with patient briefly. Care management coordinator introduced self and explained purpose of the call. Patient states "I'm okay right now". She told care management coordinator that she is "unable to talk on the phone at this time" and to "call back later".  Care management coordinator asked patient the best time to contact her back but she was unable to give a specific answer and states "just call back anytime".   Plan: Will try to call patient back at a later time as requested. Will schedule for next outreach call.    Shaney Deckman A. Michel Hendon, BSN, RN-BC [place] Management Coordinator Cell: [phone]

## 2015-04-15 ENCOUNTER — Other Ambulatory Visit: Payer: Self-pay | Admitting: *Deleted

## 2015-04-15 NOTE — Patient Outreach (Signed)
[name]) Care Management  04/15/2015  NORLEEN DUMITRESCU 1923-03-09 [id number]  Assessment: Transition of care Called and spoke with patient who reports that she is "still weak" but slowly feeling better.  She has no reported abdominal pain related to diverticulitis at the moment. Patient has a good understanding of her medications, diet and [name] stay. Transition of care call completed.   Patient reports living alone but has a daughter who lives close by (about a mile away) that assists with her needs and provides transportation to her doctors' appointments, although patient reports still being able to drive.  Patient states that she manages her own medications and takes them as prescribed. She notified care management coordinator that she continues to take both antibiotics- Augmentin and Vancomycin.    Patient has not scheduled a follow-up appointment with primary care provider (Dr. Everlene Farrier) as well as general surgeon (Dr. Renelda Loma). Patient agreed to call providers' offices today to set-up post [name] follow up appointments.   Patient has an appointment for INR check on 04/12/15.   Patient reports weighing self daily and recording results. She mentioned having swelling to lower extremities and keeping legs elevated to help decrease swelling. Patient states being able to walk and without shortness of breath.  Patient denies any urgent needs or concerns at this time. She agreed to home visit next week, however, she stated that her house is a mess since it's been a 80 years old. Patient was encouraged to call [facility] management coordinator or 24-hour nurse line if needed. Contact information provided to her.    Plan: Initial home visit on 04/24/15.  Caylin Nass A. Arnold Depinto, BSN, RN-BC [place] Management Coordinator Cell: [phone]

## 2015-04-16 ENCOUNTER — Ambulatory Visit (INDEPENDENT_AMBULATORY_CARE_PROVIDER_SITE_OTHER): Payer: Medicare Other | Admitting: Pharmacist Clinician (PhC)/ Clinical Pharmacy Specialist

## 2015-04-16 DIAGNOSIS — I6310 Cerebral infarction due to embolism of unspecified precerebral artery: Secondary | ICD-10-CM

## 2015-04-16 DIAGNOSIS — I480 Paroxysmal atrial fibrillation: Secondary | ICD-10-CM

## 2015-04-16 DIAGNOSIS — Z7901 Long term (current) use of anticoagulants: Secondary | ICD-10-CM

## 2015-04-16 LAB — POCT INR: INR: 1.8

## 2015-04-23 ENCOUNTER — Ambulatory Visit (INDEPENDENT_AMBULATORY_CARE_PROVIDER_SITE_OTHER): Payer: Medicare Other | Admitting: Emergency Medicine

## 2015-04-23 VITALS — BP 112/66 | HR 90 | Temp 98.0°F | Resp 16 | Ht 64.0 in | Wt 153.0 lb

## 2015-04-23 DIAGNOSIS — K5732 Diverticulitis of large intestine without perforation or abscess without bleeding: Secondary | ICD-10-CM | POA: Diagnosis not present

## 2015-04-23 DIAGNOSIS — R1030 Lower abdominal pain, unspecified: Secondary | ICD-10-CM

## 2015-04-23 DIAGNOSIS — R1904 Left lower quadrant abdominal swelling, mass and lump: Secondary | ICD-10-CM

## 2015-04-23 DIAGNOSIS — I6310 Cerebral infarction due to embolism of unspecified precerebral artery: Secondary | ICD-10-CM | POA: Diagnosis not present

## 2015-04-23 MED ORDER — GABAPENTIN 100 MG PO CAPS: 100.0000 mg | ORAL_CAPSULE | Freq: Three times a day (TID) | ORAL | Status: DC

## 2015-04-23 NOTE — Addendum Note (Signed)
Addended by: [name] A on: 04/23/2015 10:35 AM   Modules accepted: Orders

## 2015-04-23 NOTE — Patient Instructions (Addendum)
     IF you received an x-ray today, you will receive an invoice from [facility]. Please contact [facility] at [phone] with questions or concerns regarding your invoice.   IF you received labwork today, you will receive an invoice from [employer]. Please contact [facility] at [phone] with questions or concerns regarding your invoice.   Our billing staff will not be able to assist you with questions regarding bills from these companies.  You will be contacted with the lab results as soon as they are available. The fastest way to get your results is to activate your My Chart account. Instructions are located on the last page of this paperwork. If you have not heard from [place] regarding the results in 2 weeks, please contact this office.    I have placed a call to Dr. Dalbert Batman to discuss your case. You have been given materials to obtain a stool specimen this weekend after you finish your  vancomycin. We will check this for C. difficile.

## 2015-04-23 NOTE — Progress Notes (Addendum)
Patient ID: [name], female   DOB: 1923/07/19, 81 y.o.   MRN: [id number]    By signing my name below, I, Essence Howell, attest that this documentation has been prepared under the direction and in the presence of Darlyne Russian, MD Electronically Signed: Ladene Artist, ED Scribe 04/23/2015 at 9:08 AM.  Chief Complaint:  Chief Complaint  Patient presents with  . Hospitalization Follow-up   HPI: [name] is a 82 y.o. female who reports to [facility] today for a hospitalization follow-up. Pt was seen in the ED on 04/03/15 for recurrent left sided abdominal pain and started on Vancomycin. Pt has been compliant with medications; has 2 days left. She reports approximately 4 small BMs daily. Pt states that fever, diarrhea, vomiting and abdominal pain have resolved.   Past Medical History  Diagnosis Date  . Pericardial effusion     a. s/p pericardial window 01/16/15  . Atrial fibrillation (HCC)     Chronic  . Chronic anticoagulation     on Coumadin  . History of GI bleed     once treated with Fe infusion  . HTN (hypertension)   . Pacemaker     a. Medtronic, placed for tachybrady.   . Obesity   . LVH (left ventricular hypertrophy)   . Sleep apnea     mild-no cpap  . Positive TB test   . Anemia   . Stroke [facility])     a. 2013: right frontal  . Arthritis   . Tachycardia-bradycardia syndrome [facility])     a. s/p Medtronic Sensia  model number [id number], serial number [id number] H 12/2013  . History of shingles   . Chronic diastolic (congestive) heart failure ([place])   . Chronic atrial fibrillation (HCC)     a. on coumadin, digoxin and calan  . GI bleed     a. 2/2 AVMs   Past Surgical History  Procedure Laterality Date  . Breast lumpectomy Bilateral     negative for cancer  . Esophagogastroduodenoscopy  05/19/2011    Procedure: ESOPHAGOGASTRODUODENOSCOPY (EGD);  Surgeon: Lear Ng, MD;  Location: [facility] ENDOSCOPY;  Service: Endoscopy;  Laterality: N/A;  doctor aware of inr   will  try to be here no later than 230  . Cholecystectomy  2005  . Esophagogastroduodenoscopy  06/22/2011    Procedure: ESOPHAGOGASTRODUODENOSCOPY (EGD);  Surgeon: Arta Silence, MD;  Location: [facility] END[facility]COPY;  Service: Endoscopy;  Laterality: N/A;  Check PT/INR in am  . Givens capsule study  06/23/2011    Procedure: GIVENS CAPSULE STUDY;  Surgeon: Arta Silence, MD;  Location: [facility] ENDOSCOPY;  Service: Endoscopy;  Laterality: N/A;  . Colonoscopy  08/11/2011    Procedure: COLONOSCOPY;  Surgeon: Jeryl Columbia, MD;  Location: [facility] ENDOSCOPY;  Service: Endoscopy;  Laterality: N/A;  . Hot hemostasis  08/11/2011    Procedure: HOT HEMOSTASIS (ARGON PLASMA COAGULATION/BICAP);  Surgeon: Jeryl Columbia, MD;  Location: [facility] ENDOSCOPY;  Service: Endoscopy;  Laterality: N/A;  . Mass excision Left 09/14/2013    Procedure: EXCISION MASS LEFT WRIST;  Surgeon: Leanora Cover, MD;  Location: [place];  Service: Orthopedics;  Laterality: Left;  . Tonsillectomy and adenoidectomy  1950  . Insert / replace / remove pacemaker  2001    Last generator in 2006; interrogated Dec 2012  . Cataract extraction w/ intraocular lens  implant, bilateral Bilateral   . Lipoma excision Left 07/2013    wrist  . Pacemaker generator change N/A 01/02/2014    Procedure: PACEMAKER GENERATOR  CHANGE;  Surgeon: Sanda Klein, MD;  Location: [facility] CATH LAB;  Service: Cardiovascular;  Laterality: N/A;  . Lead revision N/A 01/02/2014    Procedure: LEAD REVISION;  Surgeon: Sanda Klein, MD;  Location: [place] CATH LAB;  Service: Cardiovascular;  Laterality: N/A;  . Subxyphoid pericardial window N/A 01/16/2015    Procedure: SUBXYPHOID PERICARDIAL WINDOW;  Surgeon: Grace Isaac, MD;  Location: [place];  Service: Thoracic;  Laterality: N/A;  . Tee without cardioversion N/A 01/16/2015    Procedure: TRANSESOPHAGEAL ECHOCARDIOGRAM (TEE);  Surgeon: Grace Isaac, MD;  Location: [place];  Service: Thoracic;  Laterality: N/A;   Social History   Social  History  . Marital Status: Widowed    Spouse Name: N/A  . Number of Children: N/A  . Years of Education: N/A   Social History Main Topics  . Smoking status: Never Smoker   . Smokeless tobacco: Never Used  . Alcohol Use: 0.0 oz/week    0 Standard drinks or equivalent per week     Comment: 01/02/2014 "I'll have a drink a few times/year"  . Drug Use: No  . Sexual Activity: No   Other Topics Concern  . None   Social History Narrative   Family History  Problem Relation Age of Onset  . Stroke Mother   . Stroke Father   . Pneumonia Father   . Colon cancer Sister   . Colon cancer Daughter    Allergies  Allergen Reactions  . Iodinated Diagnostic Agents Hives    HIVES 15MIN S/P IV CONTRAST INJECTION,WILL NEED 13 HR PREP FOR FUTURE INJECTIONS, ok s/p 50mg  po benadryl//a.calhoun  . Anti-Inflammatory Enzyme [Nutritional Supplements]     Retains fluids and headaches  . Arthrotec [Diclofenac-Misoprostol] Other (See Comments)    unknown  . Biaxin [Clarithromycin] Other (See Comments)    unknown  . Plavix [Clopidogrel Bisulfate] Other (See Comments)    unknown  . Spironolactone     Hair loss   Prior to Admission medications   Medication Sig Start Date End Date Taking? Authorizing Provider  Digoxin 62.5 MCG TABS Take 0.0625 mg by mouth daily. Take only 5 days a week; skip Wednesdays and Sundays 03/20/15  Yes Mihai Croitoru, MD  hydrocortisone-pramoxine [facility]) 2.5-1 % rectal cream Place 1 application rectally 3 (three) times daily. Reported on 04/15/2015   Yes Historical Provider, MD  ondansetron (ZOFRAN ODT) 4 MG disintegrating tablet Take 1 tablet (4 mg total) by mouth every 8 (eight) hours as needed for nausea or vomiting. 03/09/15  Yes [place] N Rumley, DO  potassium chloride (K-DUR,KLOR-CON) 10 MEQ tablet Take 2 tablets (20 mEq total) by mouth 2 (two) times daily. Take with Torsemide 04/11/15  Yes Hillary Corinda Gubler, MD  Saccharomyces boulardii 250 MG CHEW Chew 500 mg by  mouth 2 (two) times daily. 04/11/15 04/24/15 Yes Hillary Corinda Gubler, MD  torsemide (DEMADEX) 20 MG tablet Take 3 tablets (60 mg total) by mouth daily. 01/26/15  Yes Eileen Stanford, PA-C  vancomycin (VANCOCIN) 50 mg/mL oral solution Take 2.5 mLs (125 mg total) by mouth every 6 (six) hours. 04/11/15 04/24/15 Yes Hillary Corinda Gubler, MD  verapamil (CALAN-SR) 180 MG CR tablet take 1 tablet by mouth at bedtime 03/26/15  Yes Mihai Croitoru, MD   ROS: The patient denies fevers, chills, night sweats, unintentional weight loss, chest pain, palpitations, wheezing, dyspnea on exertion, nausea, -vomiting, -diarrhea, -abdominal pain, dysuria, hematuria, melena, numbness, weakness, or tingling.   All other systems have been reviewed and were otherwise negative  with the exception of those mentioned in the HPI and as above.    PHYSICAL EXAM: Filed Vitals:   04/23/15 0839  BP: 112/66  Pulse: 90  Temp: 98 F (36.7 C)  Resp: 16   Body mass index is 26.25 kg/(m^2).  General: Alert, no acute distress HEENT:  Normocephalic, atraumatic, oropharynx patent. Eye: [facility], [facility] Cardiovascular: Irregular rate. Grade 3 systolic murmur in the L sternal border Respiratory: Clear to auscultation bilaterally. No wheezes, rales, or rhonchi. No cyanosis, no use of accessory musculature Abdominal: Grapefruit size mass in LLQ which is tender.  Musculoskeletal: Gait intact. No edema, tenderness Skin: No rashes. Neurologic: Facial musculature symmetric. Psychiatric: Patient acts appropriately throughout our interaction. Lymphatic: No cervical or submandibular lymphadenopathy  LABS:  EKG/XRAY:   Primary read interpreted by [facility]. [facility]lene Farrier at [facility].  ASSESSMENT/PLAN: Clinically the patient feels better. She has a persistent large left lower abdominal mass. I very concerned about an underlying malignancy. We'll discuss the issue with Dr. Dalbert Batman. She will finish her vancomycin in 2 days. We will collect a C. difficile  specimen this weekend.I personally performed the services described in this documentation, which was scribed in my presence. The recorded information has been reviewed and is accurate. She is having severe pain in her back. I did place her on Neurontin 100 mg every 6-8 hours I discussed her case with Dr. Dalbert Batman. He would like patient to see Dr. Watt Climes first and then he will see the patient in follow-up.Johney Maine sideeffects, risk and benefits, and alternatives of medications d/w patient. Patient is aware that all medications have potential sideeffects and we are unable to predict every sideeffect or drug-drug interaction that may occur.  [name] MD 04/23/2015 8:57 AM

## 2015-04-24 ENCOUNTER — Other Ambulatory Visit: Payer: Self-pay | Admitting: *Deleted

## 2015-04-24 ENCOUNTER — Telehealth: Payer: Self-pay

## 2015-04-24 ENCOUNTER — Encounter: Payer: Self-pay | Admitting: *Deleted

## 2015-04-24 NOTE — Telephone Encounter (Signed)
Dr. Everlene Farrier spoke to Dr. Watt Climes. Pt is to call Barb at Dr. Perley Jain office to get an appt next week.  I called pt and advised her to call Dr. Perley Jain office to get an appt.

## 2015-04-24 NOTE — Patient Outreach (Signed)
[facility]) Care Management  04/24/2015  [name] [birth year] [id number]   Assessment: Initial home visit- (Transition of care - week 2) - Case closure  Arrived at patient's one level home. Patient opened the door and identity was verified. Care management coordinator introduced self. Visit ended early since patient states she does not want to participate with the program. Reinforced with patient her eligibility to the program but she continued to refuse. Patient shared that she was seen for follow-up by primary care provider (Dr. Everlene Farrier) yesterday and was told that a recheck of stool for C-diff will be done after she completes Vancomycin dose.   Care management coordinator encouraged patient to call [facility] care management if further or future needs arise. Contact information provided to patient. Patient made aware of case closure and will notify primary care provider of such. Patient notified that primary care provider can refer patient back to [facility] if needed.  Plan: Will close case since patient voluntarily withdrawn herself from the program (refused to participate with the program). Will notify primary care provider of case closure.   [name], BSN, RN-BC [place] Management Coordinator Cell: [phone]

## 2015-04-26 ENCOUNTER — Telehealth: Payer: Self-pay

## 2015-04-26 NOTE — Telephone Encounter (Signed)
Request for surgical clearance:   1. What type surgery is being performed? colonoscopy  2. When is this surgery scheduled? 05/13/15  3. Are there any medications that need to be held prior to surgery and how long? Warfarin; not listed in her chart. Called patient, she takes warfarin 5 mg. Takes 0.5 tablet (2.5 mg total) Monday, Wednesday, Friday, and Saturday. Takes 1 tablet (5 mg total) Sunday, Tuesday, and Thursday.  4. Name of the physician performing surgery: Dr Watt Climes  5. What is the office phone and fax number? [facility]  Phone [phone]  Fax [phone]

## 2015-04-29 ENCOUNTER — Ambulatory Visit (INDEPENDENT_AMBULATORY_CARE_PROVIDER_SITE_OTHER): Payer: Medicare Other | Admitting: Internal Medicine

## 2015-04-29 ENCOUNTER — Encounter: Payer: Self-pay | Admitting: Cardiovascular Disease

## 2015-04-29 DIAGNOSIS — I6310 Cerebral infarction due to embolism of unspecified precerebral artery: Secondary | ICD-10-CM

## 2015-04-29 DIAGNOSIS — Z7901 Long term (current) use of anticoagulants: Secondary | ICD-10-CM

## 2015-04-29 DIAGNOSIS — I480 Paroxysmal atrial fibrillation: Secondary | ICD-10-CM

## 2015-04-29 LAB — POCT INR: INR: 5.74

## 2015-04-29 NOTE — Addendum Note (Signed)
Addended by: Frutoso Chase A on: 04/29/2015 12:02 PM   Modules accepted: Orders

## 2015-04-29 NOTE — Telephone Encounter (Signed)
Letter in [place]. Erasmo Downer, can you please coordinate the enoxaparin bridge? Thank you.

## 2015-04-30 LAB — CLOSTRIDIUM DIFFICILE BY PCR: CDIFFPCR: NOT DETECTED

## 2015-05-01 ENCOUNTER — Ambulatory Visit (INDEPENDENT_AMBULATORY_CARE_PROVIDER_SITE_OTHER): Payer: Medicare Other

## 2015-05-01 ENCOUNTER — Encounter (HOSPITAL_COMMUNITY): Payer: Self-pay | Admitting: *Deleted

## 2015-05-01 ENCOUNTER — Ambulatory Visit (INDEPENDENT_AMBULATORY_CARE_PROVIDER_SITE_OTHER): Payer: Medicare Other | Admitting: Emergency Medicine

## 2015-05-01 ENCOUNTER — Emergency Department (HOSPITAL_COMMUNITY): Payer: Medicare Other

## 2015-05-01 ENCOUNTER — Inpatient Hospital Stay (HOSPITAL_COMMUNITY)
Admission: EM | Admit: 2015-05-01 | Discharge: 2015-05-08 | DRG: 292 | Disposition: A | Discharge status: Home Health | Payer: Medicare Other | Attending: Family Medicine | Admitting: Family Medicine

## 2015-05-01 VITALS — BP 122/68 | HR 52 | Temp 98.5°F | Resp 16 | Ht 65.0 in | Wt 165.0 lb

## 2015-05-01 DIAGNOSIS — Z9842 Cataract extraction status, left eye: Secondary | ICD-10-CM

## 2015-05-01 DIAGNOSIS — I482 Chronic atrial fibrillation, unspecified: Secondary | ICD-10-CM | POA: Diagnosis present

## 2015-05-01 DIAGNOSIS — E873 Alkalosis: Secondary | ICD-10-CM | POA: Diagnosis present

## 2015-05-01 DIAGNOSIS — Z8673 Personal history of transient ischemic attack (TIA), and cerebral infarction without residual deficits: Secondary | ICD-10-CM | POA: Diagnosis not present

## 2015-05-01 DIAGNOSIS — I6310 Cerebral infarction due to embolism of unspecified precerebral artery: Secondary | ICD-10-CM

## 2015-05-01 DIAGNOSIS — G8929 Other chronic pain: Secondary | ICD-10-CM | POA: Diagnosis present

## 2015-05-01 DIAGNOSIS — Z7901 Long term (current) use of anticoagulants: Secondary | ICD-10-CM | POA: Diagnosis not present

## 2015-05-01 DIAGNOSIS — K5790 Diverticulosis of intestine, part unspecified, without perforation or abscess without bleeding: Secondary | ICD-10-CM | POA: Insufficient documentation

## 2015-05-01 DIAGNOSIS — I5033 Acute on chronic diastolic (congestive) heart failure: Secondary | ICD-10-CM | POA: Diagnosis present

## 2015-05-01 DIAGNOSIS — Z950 Presence of cardiac pacemaker: Secondary | ICD-10-CM | POA: Diagnosis not present

## 2015-05-01 DIAGNOSIS — Z79899 Other long term (current) drug therapy: Secondary | ICD-10-CM

## 2015-05-01 DIAGNOSIS — D649 Anemia, unspecified: Secondary | ICD-10-CM | POA: Diagnosis present

## 2015-05-01 DIAGNOSIS — Z9841 Cataract extraction status, right eye: Secondary | ICD-10-CM

## 2015-05-01 DIAGNOSIS — G4733 Obstructive sleep apnea (adult) (pediatric): Secondary | ICD-10-CM | POA: Diagnosis present

## 2015-05-01 DIAGNOSIS — R509 Fever, unspecified: Secondary | ICD-10-CM | POA: Diagnosis not present

## 2015-05-01 DIAGNOSIS — I110 Hypertensive heart disease with heart failure: Principal | ICD-10-CM | POA: Diagnosis present

## 2015-05-01 DIAGNOSIS — M1990 Unspecified osteoarthritis, unspecified site: Secondary | ICD-10-CM | POA: Diagnosis present

## 2015-05-01 DIAGNOSIS — T502X5A Adverse effect of carbonic-anhydrase inhibitors, benzothiadiazides and other diuretics, initial encounter: Secondary | ICD-10-CM | POA: Diagnosis present

## 2015-05-01 DIAGNOSIS — N390 Urinary tract infection, site not specified: Secondary | ICD-10-CM | POA: Diagnosis present

## 2015-05-01 DIAGNOSIS — K631 Perforation of intestine (nontraumatic): Secondary | ICD-10-CM | POA: Diagnosis not present

## 2015-05-01 DIAGNOSIS — K921 Melena: Secondary | ICD-10-CM | POA: Diagnosis present

## 2015-05-01 DIAGNOSIS — E876 Hypokalemia: Secondary | ICD-10-CM | POA: Diagnosis present

## 2015-05-01 DIAGNOSIS — R1900 Intra-abdominal and pelvic swelling, mass and lump, unspecified site: Secondary | ICD-10-CM | POA: Diagnosis not present

## 2015-05-01 DIAGNOSIS — K5732 Diverticulitis of large intestine without perforation or abscess without bleeding: Secondary | ICD-10-CM

## 2015-05-01 DIAGNOSIS — M546 Pain in thoracic spine: Secondary | ICD-10-CM | POA: Diagnosis present

## 2015-05-01 DIAGNOSIS — I509 Heart failure, unspecified: Secondary | ICD-10-CM | POA: Diagnosis not present

## 2015-05-01 DIAGNOSIS — N179 Acute kidney failure, unspecified: Secondary | ICD-10-CM | POA: Diagnosis present

## 2015-05-01 DIAGNOSIS — E1122 Type 2 diabetes mellitus with diabetic chronic kidney disease: Secondary | ICD-10-CM | POA: Diagnosis present

## 2015-05-01 DIAGNOSIS — I481 Persistent atrial fibrillation: Secondary | ICD-10-CM | POA: Diagnosis not present

## 2015-05-01 DIAGNOSIS — I4821 Permanent atrial fibrillation: Secondary | ICD-10-CM | POA: Diagnosis present

## 2015-05-01 DIAGNOSIS — Z961 Presence of intraocular lens: Secondary | ICD-10-CM | POA: Diagnosis present

## 2015-05-01 DIAGNOSIS — R0602 Shortness of breath: Secondary | ICD-10-CM | POA: Diagnosis present

## 2015-05-01 DIAGNOSIS — R1904 Left lower quadrant abdominal swelling, mass and lump: Secondary | ICD-10-CM | POA: Diagnosis present

## 2015-05-01 DIAGNOSIS — K5720 Diverticulitis of large intestine with perforation and abscess without bleeding: Secondary | ICD-10-CM | POA: Diagnosis present

## 2015-05-01 DIAGNOSIS — K5780 Diverticulitis of intestine, part unspecified, with perforation and abscess without bleeding: Secondary | ICD-10-CM | POA: Diagnosis present

## 2015-05-01 DIAGNOSIS — K625 Hemorrhage of anus and rectum: Secondary | ICD-10-CM

## 2015-05-01 DIAGNOSIS — R0902 Hypoxemia: Secondary | ICD-10-CM

## 2015-05-01 DIAGNOSIS — E878 Other disorders of electrolyte and fluid balance, not elsewhere classified: Secondary | ICD-10-CM | POA: Diagnosis present

## 2015-05-01 DIAGNOSIS — K5792 Diverticulitis of intestine, part unspecified, without perforation or abscess without bleeding: Secondary | ICD-10-CM | POA: Diagnosis not present

## 2015-05-01 LAB — CBC WITH DIFFERENTIAL/PLATELET
BASOS ABS: 0 10*3/uL (ref 0.0–0.1)
Basophils Relative: 0 %
EOS ABS: 0 10*3/uL (ref 0.0–0.7)
Eosinophils Relative: 0 %
HEMATOCRIT: 33.6 % — AB (ref 36.0–46.0)
Hemoglobin: 10.4 g/dL — ABNORMAL LOW (ref 12.0–15.0)
LYMPHS ABS: 1.6 10*3/uL (ref 0.7–4.0)
Lymphocytes Relative: 12 %
MCH: 26.7 pg (ref 26.0–34.0)
MCHC: 31 g/dL (ref 30.0–36.0)
MCV: 86.2 fL (ref 78.0–100.0)
MONO ABS: 1.3 10*3/uL — AB (ref 0.1–1.0)
Monocytes Relative: 10 %
NEUTROS ABS: 10.3 10*3/uL — AB (ref 1.7–7.7)
Neutrophils Relative %: 78 %
PLATELETS: 484 10*3/uL — AB (ref 150–400)
RBC: 3.9 MIL/uL (ref 3.87–5.11)
RDW: 14.4 % (ref 11.5–15.5)
WBC: 13.2 10*3/uL — AB (ref 4.0–10.5)

## 2015-05-01 LAB — I-STAT CHEM 8, ED
BUN: 30 mg/dL — AB (ref 6–20)
CHLORIDE: 94 mmol/L — AB (ref 101–111)
Calcium, Ion: 1.07 mmol/L — ABNORMAL LOW (ref 1.13–1.30)
Creatinine, Ser: 1.4 mg/dL — ABNORMAL HIGH (ref 0.44–1.00)
GLUCOSE: 96 mg/dL (ref 65–99)
HCT: 36 % (ref 36.0–46.0)
Hemoglobin: 12.2 g/dL (ref 12.0–15.0)
POTASSIUM: 4.1 mmol/L (ref 3.5–5.1)
Sodium: 138 mmol/L (ref 135–145)
TCO2: 31 mmol/L (ref 0–100)

## 2015-05-01 LAB — COMPLETE METABOLIC PANEL WITH GFR
ALBUMIN: 3.3 g/dL — AB (ref 3.6–5.1)
ALK PHOS: 118 U/L (ref 33–130)
ALT: 22 U/L (ref 6–29)
AST: 37 U/L — ABNORMAL HIGH (ref 10–35)
BILIRUBIN TOTAL: 0.7 mg/dL (ref 0.2–1.2)
BUN: 28 mg/dL — ABNORMAL HIGH (ref 7–25)
CO2: 32 mmol/L — ABNORMAL HIGH (ref 20–31)
Calcium: 9.1 mg/dL (ref 8.6–10.4)
Chloride: 98 mmol/L (ref 98–110)
Creat: 1.51 mg/dL — ABNORMAL HIGH (ref 0.60–0.88)
GFR, EST AFRICAN AMERICAN: 35 mL/min — AB (ref >=60)
GFR, EST NON AFRICAN AMERICAN: 30 mL/min — AB (ref >=60)
GLUCOSE: 109 mg/dL — AB (ref 65–99)
POTASSIUM: 5.2 mmol/L (ref 3.5–5.3)
SODIUM: 139 mmol/L (ref 135–146)
Total Protein: 6.7 g/dL (ref 6.1–8.1)

## 2015-05-01 LAB — POCT CBC
GRANULOCYTE PERCENT: 79.8 % (ref 37–80)
HEMATOCRIT: 29.2 % — AB (ref 37.7–47.9)
Hemoglobin: 10 g/dL — AB (ref 12.2–16.2)
Lymph, poc: 1.8 (ref 0.6–3.4)
MCH: 28.2 pg (ref 27–31.2)
MCHC: 34.1 g/dL (ref 31.8–35.4)
MCV: 82.5 fL (ref 80–97)
MID (CBC): 0.9 (ref 0–0.9)
MPV: 7.5 fL (ref 0–99.8)
PLATELET COUNT, POC: 468 10*3/uL — AB (ref 142–424)
POC GRANULOCYTE: 10.9 — AB (ref 2–6.9)
POC LYMPH PERCENT: 13.6 %L (ref 10–50)
POC MID %: 6.6 %M (ref 0–12)
RBC: 3.54 M/uL — AB (ref 4.04–5.48)
RDW, POC: 14.7 %
WBC: 13.6 10*3/uL — AB (ref 4.6–10.2)

## 2015-05-01 LAB — BASIC METABOLIC PANEL
Anion gap: 12 (ref 5–15)
BUN: 24 mg/dL — ABNORMAL HIGH (ref 6–20)
CALCIUM: 9.1 mg/dL (ref 8.9–10.3)
CO2: 30 mmol/L (ref 22–32)
CREATININE: 1.42 mg/dL — AB (ref 0.44–1.00)
Chloride: 96 mmol/L — ABNORMAL LOW (ref 101–111)
GFR, EST AFRICAN AMERICAN: 36 mL/min — AB (ref >60)
GFR, EST NON AFRICAN AMERICAN: 31 mL/min — AB (ref >60)
Glucose, Bld: 97 mg/dL (ref 65–99)
Potassium: 4.3 mmol/L (ref 3.5–5.1)
SODIUM: 138 mmol/L (ref 135–145)

## 2015-05-01 LAB — I-STAT TROPONIN, ED: Troponin i, poc: 0 ng/mL (ref 0.00–0.08)

## 2015-05-01 LAB — DIGOXIN LEVEL: Digoxin Level: 0.8 ng/mL (ref 0.8–2.0)

## 2015-05-01 LAB — PROTIME-INR
INR: 2.61 — ABNORMAL HIGH (ref 0.00–1.49)
PROTHROMBIN TIME: 27.6 s — AB (ref 11.6–15.2)

## 2015-05-01 LAB — TROPONIN I: Troponin I: 0.03 ng/mL (ref <0.031)

## 2015-05-01 LAB — BRAIN NATRIURETIC PEPTIDE: B Natriuretic Peptide: 398.4 pg/mL — ABNORMAL HIGH (ref 0.0–100.0)

## 2015-05-01 LAB — POCT SEDIMENTATION RATE: POCT SED RATE: 86 mm/hr — AB (ref 0–22)

## 2015-05-01 MED ORDER — WARFARIN SODIUM 2.5 MG PO TABS
2.5000 mg | ORAL_TABLET | Freq: Once | ORAL | Status: AC
  Administered 2015-05-01: 2.5 mg via ORAL
  Filled 2015-05-01: qty 1

## 2015-05-01 MED ORDER — SODIUM CHLORIDE 0.9% FLUSH
3.0000 mL | Freq: Two times a day (BID) | INTRAVENOUS | Status: DC
  Administered 2015-05-02 – 2015-05-08 (×10): 3 mL via INTRAVENOUS

## 2015-05-01 MED ORDER — WARFARIN - PHARMACIST DOSING INPATIENT
Freq: Every day | Status: DC
  Administered 2015-05-02 – 2015-05-06 (×5)
  Administered 2015-05-07: 1

## 2015-05-01 MED ORDER — VERAPAMIL HCL ER 180 MG PO TBCR
180.0000 mg | EXTENDED_RELEASE_TABLET | Freq: Every day | ORAL | Status: DC
  Administered 2015-05-01 – 2015-05-07 (×7): 180 mg via ORAL
  Filled 2015-05-01 (×8): qty 1

## 2015-05-01 MED ORDER — GABAPENTIN 100 MG PO CAPS
100.0000 mg | ORAL_CAPSULE | Freq: Three times a day (TID) | ORAL | Status: DC
  Administered 2015-05-01 – 2015-05-08 (×20): 100 mg via ORAL
  Filled 2015-05-01 (×20): qty 1

## 2015-05-01 MED ORDER — FUROSEMIDE 10 MG/ML IJ SOLN
40.0000 mg | Freq: Once | INTRAMUSCULAR | Status: AC
  Administered 2015-05-01: 40 mg via INTRAVENOUS
  Filled 2015-05-01: qty 4

## 2015-05-01 MED ORDER — DIGOXIN 125 MCG PO TABS
0.0625 mg | ORAL_TABLET | ORAL | Status: DC
  Administered 2015-05-02 – 2015-05-07 (×4): 0.0625 mg via ORAL
  Filled 2015-05-01 (×4): qty 1

## 2015-05-01 MED ORDER — FUROSEMIDE 10 MG/ML IJ SOLN
40.0000 mg | INTRAMUSCULAR | Status: AC
  Administered 2015-05-01: 40 mg via INTRAVENOUS
  Filled 2015-05-01: qty 4

## 2015-05-01 NOTE — H&P (Signed)
[facility] Admission History and Physical Service Pager: [phone]  Patient name: [name] Medical record number: [id number] Date of birth: Oct 07, 1923 Age: 82 y.o. Gender: female  Primary Care Provider: [name], MD Consultants: GI Code Status: full  Chief Complaint: shortness of breath and fever  Assessment and Plan: [name] is a 82 y.o. female presenting with shortness of breath and fever. PMH is significant for atrial fibrillation, hx of CVA, hypertension, type 2 diabetes, tachycardia-bradycardia syndrome s/p pacemaker, prior pericardial effusion s/p pericardial window 12/16, sleep apnea (no CPAP), stroke (right frontal 2013), GI bleeding with hemorrhoids, and arthritis and recurrent diverticulitis.  Dyspnea: likely 2/2 CHF exacerbation. Exam remarkable for JVD, bibasilar crackles, pitting edema to her hip. CXR remarkable for interstitial edema. Doubt ACS without chest pain. However, patient with significant risk factors. HEART score 5-6. EKG with mild ST elevation in V1, however not convincing for ACS. However, it looks significantly different from prior. also no pacing noted. Doubt PE without chest pain. INR also therapeutic. Doubt pneumonia in the setting of fever (reported by patient however none here). WBC only 13 (stable from previous check). No cough.  Patient with bright red blood per rectum, however Hgb stable therefore dyspnea less likely secondary to anemia.  -Admit to FPTS. Bed with telemery. Attedning Dr. Nori Riis -s/p IV lasix 40 mg. Will dose once again tonight -hold home toresamide -Strict I&O -Cycle trop (can discontinue if negative once). No chest pain. POCT trop negative. -EKG in am -Card consult particularly about pacemaker and EKG changes. -Consider repeat Echo - strict I&Os  -Oxygen as needed   AKI: Cr 1.4. BUN 30. Cr 1.0 on discharge on 03/16. Likely secondary to Torsemide. Could also be secondary to CHF vs  cardiorenal syndrome. -Will monitor BMP   #Diverticulitis: completed course of antibiotics after discharge. Reports fever. Exam remarkable for LLQ mass about a golf ball size. No such mass noted on CT abdomen during previous admission, however concern for some neoplastic process as the inflammation and edema noted in the distal descending and proximal sigmoid colon was unchanged from previous 1 mont prior  KUB normal. Has a colonoscopy scheduled for 5/4. -consider touching base with GI for FYI - will get CT abdomen/pelvis without contrast (will get oral contrast). - holding off on cipro/flagyl given afebrile and well appearing here.   # Atrial fibrillation: ECHO EF 0000000, no diastolic dysfunction in [id number]. Mod RAE, LAE and PAPP of 38.  Digoxin level 0.8, normal today - Continue Verapamil and Digoxin  -Continue warfarin for now - INR 2.6 on amdission -Daily PT/INR  #Anemia: hgb stable at 10 over the last two days. (baseline 11.5). History of diverticulitis and hemorrhoids per patient, bleeding is mild and at her baseline. - Monitor with am CBCs - continue warfarin for now, but low threshold to hold - FOBT ordered  - monitor for GI bleed -CBC in the morning  FEN/GI: -heart healthy -saline lock  Prophylaxis: on full anticoagulat  Disposition: regular floor with telemetry.  History of Present Illness:  [name] is a 82 y.o. female presenting with fever and shortness of breath. Patient recently admitted from 04/03/2015 to 04/11/2015 with diverticulitis. Discharged on Augmentin and oral vanc (for C. Diff). She reports completing the course.   Patient reports feeling well until two days ago when she started having fever and shortness of breath.  Today, she presented to her PCP due to fever and worsening shortness of breath for two days.  She checked her temperature about two days ago, and it was 100.81F. She also started having shortness of breathing and fatigue. She says she  falls asleep while sitting.  Denies chest pain, orthopnea or PND. Reports leg swelling since she was discharged on torsemide, however it has gotten worse. She reports taking her torsemide 60mg  daily. However, it is not helping her much.   She also reports an abdominal lump that was noted by PCP, who told her that it needs to be removed.  Patient reports having bowel movement this morning. She says the stool is formed. She reports some bright blood with it which is her baseline. From chart review she has a h/o internal and external hemorrhoids.    ED course: vital signs normal except for RR of 29 on arrival. Started on 2L by [place]. BMP with sCr to 1.42 (0.99 on 03/16) and ionized Ca of 1.07. CBC with WBC of 13.2. BNP 400. Poc troponin negative. PT/INR 28/2.61. CXR with pulm edma.  Review Of Systems: Per HPI  Otherwise the remainder of the systems were negative.  Patient Active Problem List   Diagnosis Date Noted  . CHF (congestive heart failure) ([place]) 05/01/2015  . Supratherapeutic INR   . Paroxysmal atrial fibrillation (HCC)   . Essential hypertension, benign   . Left sided abdominal pain   . Diverticulitis 03/08/2015  . Obesity   . Chronic atrial fibrillation ([place])   . Upper airway cough syndrome   . Dyspnea 05/22/2014  . Tachycardia-bradycardia syndrome with symptomatic bradycardia 01/03/2014  . History of CVA (cerebrovascular accident) 10/25/2013  . Diabetes ([place]) 08/03/2013  . Myofacial muscle pain 10/16/2011  . Back pain, Left Flank 07/01/2011  . Cerebral embolism with cerebral infarction ([place]) 06/12/2011  . CVA (cerebral infarction) 05/20/2011  . Weakness of left side of body 05/16/2011  . Elevated digoxin level 05/16/2011  . Pacemaker 05/21/2010  . Edema 04/24/2010  . Pericardial effusion, Large   . Chronic anticoagulation - Coumadin, CHADS2VASC=7   . HTN (hypertension)   . Atrial fibrillation ([place]) 04/14/2010    Past Medical History: Past Medical History  Diagnosis Date   . Pericardial effusion     a. s/p pericardial window 01/16/15  . Atrial fibrillation (HCC)     Chronic  . Chronic anticoagulation     on Coumadin  . History of GI bleed     once treated with Fe infusion  . HTN (hypertension)   . Pacemaker     a. Medtronic, placed for tachybrady.   . Obesity   . LVH (left ventricular hypertrophy)   . Sleep apnea     mild-no cpap  . Positive TB test   . Anemia   . Stroke [facility])     a. 2013: right frontal  . Arthritis   . Tachycardia-bradycardia syndrome [facility])     a. s/p Medtronic Sensia  model number [id number], serial number [id number] H 12/2013  . History of shingles   . Chronic diastolic (congestive) heart failure ([place])   . Chronic atrial fibrillation (HCC)     a. on coumadin, digoxin and calan  . GI bleed     a. 2/2 AVMs    Past Surgical History: Past Surgical History  Procedure Laterality Date  . Breast lumpectomy Bilateral     negative for cancer  . Esophagogastroduodenoscopy  05/19/2011    Procedure: ESOPHAGOGASTRODUODENOSCOPY (EGD);  Surgeon: Lear Ng, MD;  Location[facility];  Service: Endoscopy;  Laterality: N/A;  doctor aware of inr  will try to be here no later than 230  . Cholecystectomy  2005  . Esophagogastroduodenoscopy  06/22/2011    Procedure: ESOPHAGOGASTRODUODENOSCOPY (EGD);  Surgeon: Arta Silence, MD;  Location: [facility] ENDOSCOPY;  Service: Endoscopy;  Laterality: N/A;  Check PT/INR in am  . Givens capsule study  06/23/2011    Procedure: GIVENS CAPSULE STUDY;  Surgeon: Arta Silence, MD;  Location: [facility] ENDOSCOPY;  Service: Endoscopy;  Laterality: N/A;  . Colonoscopy  08/11/2011    Procedure: COLONOSCOPY;  Surgeon: Jeryl Columbia, MD;  Location: [facility] ENDOSCOPY;  Service: Endoscopy;  Laterality: N/A;  . Hot hemostasis  08/11/2011    Procedure: HOT HEMOSTASIS (ARGON PLASMA COAGULATION/BICAP);  Surgeon: Jeryl Columbia, MD;  Location: [facility] ENDOSCOPY;  Service: Endoscopy;  Laterality: N/A;  . Mass excision Left 09/14/2013     Procedure: EXCISION MASS LEFT WRIST;  Surgeon: Leanora Cover, MD;  Location: [place];  Service: Orthopedics;  Laterality: Left;  . Tonsillectomy and adenoidectomy  1950  . Insert / replace / remove pacemaker  2001    Last generator in 2006; interrogated Dec 2012  . Cataract extraction w/ intraocular lens  implant, bilateral Bilateral   . Lipoma excision Left 07/2013    wrist  . Pacemaker generator change N/A 01/02/2014    Procedure: PACEMAKER GENERATOR CHANGE;  Surgeon: Sanda Klein, MD;  Location: [place] CATH LAB;  Service: Cardiovascular;  Laterality: N/A;  . Lead revision N/A 01/02/2014    Procedure: LEAD REVISION;  Surgeon: Sanda Klein, MD;  Location: [place] CATH LAB;  Service: Cardiovascular;  Laterality: N/A;  . Subxyphoid pericardial window N/A 01/16/2015    Procedure: SUBXYPHOID PERICARDIAL WINDOW;  Surgeon: Grace Isaac, MD;  Location: [place];  Service: Thoracic;  Laterality: N/A;  . Tee without cardioversion N/A 01/16/2015    Procedure: TRANSESOPHAGEAL ECHOCARDIOGRAM (TEE);  Surgeon: Grace Isaac, MD;  Location: [facility] OR;  Service: Thoracic;  Laterality: N/A;    Social History: Social History  Substance Use Topics  . Smoking status: Never Smoker   . Smokeless tobacco: Never Used  . Alcohol Use: 0.0 oz/week    0 Standard drinks or equivalent per week     Comment: 01/02/2014 "I'll have a drink a few times/year"   Additional social history:  Please also refer to relevant sections of EMR.  Family History: Family History  Problem Relation Age of Onset  . Stroke Mother   . Stroke Father   . Pneumonia Father   . Colon cancer Sister   . Colon cancer Daughter     Allergies and Medications: Allergies  Allergen Reactions  . Iodinated Diagnostic Agents Hives    HIVES 15MIN S/P IV CONTRAST INJECTION,WILL NEED 13 HR PREP FOR FUTURE INJECTIONS, ok s/p 50mg  po benadryl//a.calhoun  . Anti-Inflammatory Enzyme [Nutritional Supplements]     Retains fluids and  headaches  . Arthrotec [Diclofenac-Misoprostol] Other (See Comments)    unknown  . Biaxin [Clarithromycin] Other (See Comments)    unknown  . Plavix [Clopidogrel Bisulfate] Other (See Comments)    unknown  . Spironolactone     Hair loss   No current facility-administered medications on file prior to encounter.   Current Outpatient Prescriptions on File Prior to Encounter  Medication Sig Dispense Refill  . Digoxin 62.5 MCG TABS Take 0.0625 mg by mouth daily. Take only 5 days a week; skip Wednesdays and Sundays 30 tablet 6  . gabapentin (NEURONTIN) 100 MG capsule Take 1 capsule (100 mg total) by mouth 3 (three) times daily.  90 capsule 3  . potassium chloride (K-DUR,KLOR-CON) 10 MEQ tablet Take 2 tablets (20 mEq total) by mouth 2 (two) times daily. Take with Torsemide 120 tablet 0  . torsemide (DEMADEX) 20 MG tablet Take 3 tablets (60 mg total) by mouth daily. 180 tablet 6  . verapamil (CALAN-SR) 180 MG CR tablet take 1 tablet by mouth at bedtime 30 tablet 11  . warfarin (COUMADIN) 5 MG tablet Take 2.5-5 mg by mouth as directed. Takes 0.5 tablet (2.5 mg total) by mouth Monday, Wednesday, Friday, Saturday. Take 1 tablet (5 mg total) by mouth Sunday, Tuesday, Thursday.    . hydrocortisone-pramoxine (ANALPRAM-HC) 2.5-1 % rectal cream Place 1 application rectally 3 (three) times daily. Reported on 05/01/2015    . ondansetron (ZOFRAN ODT) 4 MG disintegrating tablet Take 1 tablet (4 mg total) by mouth every 8 (eight) hours as needed for nausea or vomiting. (Patient not taking: Reported on 05/01/2015) 20 tablet 0    Objective: BP 132/60 mmHg  Pulse 59  Temp(Src) 98.6 F (37 C) (Oral)  Resp 24  Ht 5\' 5"  (1.651 m)  Wt 165 lb (74.844 kg)  BMI 27.46 kg/m2  SpO2 97% Exam: Gen: appears well, sitting on bed eating dinner Oropharynx: clear, moist Neck: supple, no LAD, JVD to mid-neck at 45 degree CV: regular rate and rythm. S1 & S2 audible, 2/6 murmurs over right and left sternal border, 2/3+ pitting  edema bilaterally up to the knees, JVD to mid-neck  Resp: no apparent work of breathing, bibasilar crackles bilaterally no wheezing or rhonchi noted.  GI: bowel sounds normal, no tenderness to palpation, no rebound or guarding, golf size in the LLQ. Noted small amount of red/orange stool in toilet.  GU: no suprapubic tenderness Skin: no lesions Neuro: alert oriented without gross deficit  Labs and Imaging: CBC BMET   Recent Labs Lab 05/01/15 1545 05/01/15 1601  WBC 13.2*  --   HGB 10.4* 12.2  HCT 33.6* 36.0  PLT 484*  --     Recent Labs Lab 05/01/15 1545 05/01/15 1601  NA 138 138  K 4.3 4.1  CL 96* 94*  CO2 30  --   BUN 24* 30*  CREATININE 1.42* 1.40*  GLUCOSE 97 96  CALCIUM 9.1  --      EKG: junctional rhythm, HR 64. No ST elevation or depression  Dg Chest 2 View  05/01/2015  CLINICAL DATA:  Increasing shortness of breath and fever. EXAM: CHEST  2 VIEW 3:18 p.m. COMPARISON:  Radiographs dated 05/01/2015 at 11:17 a.m. and 02/14/2015 FINDINGS: There is chronic cardiomegaly with new pulmonary vascular congestion with small right and tiny left pleural effusions. The patient has been demonstrate to have the pericardial effusion in the past year on no acute bone abnormality. Pacemaker in place. IMPRESSION: Findings consistent with congestive heart failure. Electronically Signed   By: Lorriane Shire M.D.   On: 05/01/2015 15:26   Dg Abd Acute W/chest  05/01/2015  CLINICAL DATA:  diverticulitis. Pt states that she has not had a fever in the past week but has recently noticed that her temperature rises to 99 in the evenings. Triage temperature: 98.5 F. Pt reports 5-6 small BMs daily. She denies abdominal pain at this time. Pt states that her GI physician is considering a colon resection. Pt had a ptinr done 2 days ago that was 5.74. SOB: Pt reports mild SOB, more than usual. O2 level ranges from 88-92. Triage SpO2: 90. Pt states that she was unable to walk upstairs  last night due to SOB.  EXAM: DG ABDOMEN ACUTE W/ 1V CHEST COMPARISON:  CT 04/03/2015 and previous studies FINDINGS: Moderate cardiomegaly as before. Stable right subclavian transvenous pacemaker. Chronic blunting of the left laterals costophrenic angle. Central pulmonary vascular congestion. Atheromatous aorta. No free air. Normal bowel gas pattern. Patchy aortoiliac arterial calcifications. Surgical clips in the right upper abdomen and right pelvis. Mild degenerative changes in the lumbar spine IMPRESSION: Stable cardiomegaly Negative abdominal radiographs. Electronically Signed   By: Lucrezia Europe M.D.   On: 05/01/2015 12:56   Mercy Riding, MD 05/01/2015, 5:53 PM PGY-1, [place] Intern pager: [phone], text pages welcome  Upper Level Addendum:  I have seen and evaluated this patient along with Dr. Cyndia Skeeters and reviewed the above note, making necessary revisions in purple.   Archie Patten, MD [place] Resident, PGY-2

## 2015-05-01 NOTE — ED Notes (Signed)
Pt arrives from PCP office via [facility] c/o SOB. Pt has a hx of CHF. Upon EMS arrival pt was 90% on RA and 94% on 2L [place]

## 2015-05-01 NOTE — ED Provider Notes (Signed)
CSN: [id number]     Arrival date & time 05/01/15  1442 History   First MD Initiated Contact with Patient 05/01/15 1446     Chief Complaint  Patient presents with  . Shortness of Breath     (Consider location/radiation/quality/duration/timing/severity/associated sxs/prior Treatment) HPI Comments: 82 year old female with extensive past medical history including CHF, atrial fibrillation on Coumadin, CVA, diverticulitis who presents with shortness of breath. The patient presented to her PCP office today for follow-up of a recent episode of diverticulitis. She reported that her temperatures have been climbing to 99 recently and she wanted to be reevaluated. At the office, she was noted to be mildly hypoxic to 90% on room air and was sent here for further evaluation. She reports several weeks of shortness of breath that worsened last night. He has had worsening peripheral edema recently as well. She denies any chest pain, abdominal pain, or urinary symptoms. Shortness of breath is worse with exertion.  [name] presenting with shortness of breath. The history is provided by the patient.  Shortness of Breath   Past Medical History  Diagnosis Date  . Pericardial effusion     a. s/p pericardial window 01/16/15  . Atrial fibrillation (HCC)     Chronic  . Chronic anticoagulation     on Coumadin  . History of GI bleed     once treated with Fe infusion  . HTN (hypertension)   . Pacemaker     a. Medtronic, placed for tachybrady.   . Obesity   . LVH (left ventricular hypertrophy)   . Sleep apnea     mild-no cpap  . Positive TB test   . Anemia   . Stroke [facility])     a. 2013: right frontal  . Arthritis   . Tachycardia-bradycardia syndrome [facility])     a. s/p Medtronic Sensia  model number [id number], serial number [id number] H 12/2013  . History of shingles   . Chronic diastolic (congestive) heart failure ([place])   . Chronic atrial fibrillation (HCC)     a. on coumadin, digoxin and  calan  . GI bleed     a. 2/2 AVMs   Past Surgical History  Procedure Laterality Date  . Breast lumpectomy Bilateral     negative for cancer  . Esophagogastroduodenoscopy  05/19/2011    Procedure: ESOPHAGOGASTRODUODENOSCOPY (EGD);  Surgeon: Lear Ng, MD;  Location: [facility] ENDOSCOPY;  Service: Endoscopy;  Laterality: N/A;  doctor aware of inr   will try to be here no later than 230  . Cholecystectomy  2005  . Esophagogastroduodenoscopy  06/22/2011    Procedure: ESOPHAGOGASTRODUODENOSCOPY (EGD);  Surgeon: Arta Silence, MD;  Location: [facility] ENDOSCOPY;  Service: Endoscopy;  Laterality: N/A;  Check PT/INR in am  . Givens capsule study  06/23/2011    Procedure: GIVENS CAPSULE STUDY;  Surgeon: Arta Silence, MD;  Location:  [facility] ENDOSCOPY;  Service: Endoscopy;  Laterality: N/A;  . Colonoscopy  08/11/2011    Procedure: COLONOSCOPY;  Surgeon: Jeryl Columbia, MD;  Location: [facility] ENDOSCOPY;  Service: Endoscopy;  Laterality: N/A;  . Hot hemostasis  08/11/2011    Procedure: HOT HEMOSTASIS (ARGON PLASMA COAGULATION/BICAP);  Surgeon: Jeryl Columbia, MD;  Location: [facility] ENDOSCOPY;  Service: Endoscopy;  Laterality: N/A;  . Mass excision Left 09/14/2013    Procedure: EXCISION MASS LEFT WRIST;  Surgeon: Leanora Cover, MD;  Location: [place];  Service: Orthopedics;  Laterality: Left;  . Tonsillectomy and adenoidectomy  1950  . Insert / replace /  remove pacemaker  2001    Last generator in 2006; interrogated Dec 2012  . Cataract extraction w/ intraocular lens  implant, bilateral Bilateral   . Lipoma excision Left 07/2013    wrist  . Pacemaker generator change N/A 01/02/2014    Procedure: PACEMAKER GENERATOR CHANGE;  Surgeon: Sanda Klein, MD;  Location: [facility] CATH LAB;  Service: Cardiovascular;  Laterality: N/A;  . Lead revision N/A 01/02/2014    Procedure: LEAD REVISION;  Surgeon: Sanda Klein, MD;  Location: [facility] CATH LAB;  Service: Cardiovascular;  Laterality: N/A;  . Subxyphoid pericardial window N/A  01/16/2015    Procedure: SUBXYPHOID PERICARDIAL WINDOW;  Surgeon: Grace Isaac, MD;  Location: [place];  Service: Thoracic;  Laterality: N/A;  . Tee without cardioversion N/A 01/16/2015    Procedure: TRANSESOPHAGEAL ECHOCARDIOGRAM (TEE);  Surgeon: Grace Isaac, MD;  Location: [facility] OR;  Service: Thoracic;  Laterality: N/A;   Family History  Problem Relation Age of Onset  . Stroke Mother   . Stroke Father   . Pneumonia Father   . Colon cancer Sister   . Colon cancer Daughter    Social History  Substance Use Topics  . Smoking status: Never Smoker   . Smokeless tobacco: Never Used  . Alcohol Use: 0.0 oz/week    0 Standard drinks or equivalent per week     Comment: 01/02/2014 "I'll have a drink a few times/year"   OB History    No data available     Review of Systems  Respiratory: Positive for shortness of breath.     10 Systems reviewed and are negative for acute change except as noted in the HPI.   Allergies  Iodinated diagnostic agents; Anti-inflammatory enzyme; Arthrotec; Biaxin; Plavix; and Spironolactone  Home Medications   Prior to Admission medications   Medication Sig Start Date End Date Taking? Authorizing Provider  Digoxin 62.5 MCG TABS Take 0.0625 mg by mouth daily. Take only 5 days a week; skip Wednesdays and Sundays 03/20/15  Yes Mihai Croitoru, MD  gabapentin (NEURONTIN) 100 MG capsule Take 1 capsule (100 mg total) by mouth 3 (three) times daily. 04/23/15  Yes Darlyne Russian, MD  potassium chloride (K-DUR,KLOR-CON) 10 MEQ tablet Take 2 tablets (20 mEq total) by mouth 2 (two) times daily. Take with Torsemide 04/11/15  Yes Hillary Corinda Gubler, MD  torsemide (DEMADEX) 20 MG tablet Take 3 tablets (60 mg total) by mouth daily. 01/26/15  Yes Eileen Stanford, PA-C  verapamil (CALAN-SR) 180 MG CR tablet take 1 tablet by mouth at bedtime 03/26/15  Yes Mihai Croitoru, MD  warfarin (COUMADIN) 5 MG tablet Take 2.5-5 mg by mouth as directed. Takes 0.5 tablet (2.5 mg  total) by mouth Monday, Wednesday, Friday, Saturday. Take 1 tablet (5 mg total) by mouth Sunday, Tuesday, Thursday.   Yes Historical Provider, MD  hydrocortisone-pramoxine [facility]) 2.5-1 % rectal cream Place 1 application rectally 3 (three) times daily. Reported on 05/01/2015    Historical Provider, MD  ondansetron (ZOFRAN ODT) 4 MG disintegrating tablet Take 1 tablet (4 mg total) by mouth every 8 (eight) hours as needed for nausea or vomiting. Patient not taking: Reported on 05/01/2015 03/09/15   Whitefield N Rumley, DO   BP 132/60 mmHg  Pulse 59  Temp(Src) 98.6 F (37 C) (Oral)  Resp 24  Ht 5\' 5"  (1.651 m)  Wt 165 lb (74.844 kg)  BMI 27.46 kg/m2  SpO2 97% Physical Exam  Constitutional: She is oriented to person, place, and time. She appears well-developed  and well-nourished. No distress.  HENT:  Head: Normocephalic and atraumatic.  Moist mucous membranes  Eyes: Conjunctivae are normal. Pupils are equal, round, and reactive to light.  Neck: Neck supple.  Cardiovascular: Normal rate, regular rhythm and normal heart sounds.   No murmur heard. Pulmonary/Chest:  Mild tachypnea with faint crackles in bases bilaterally  Abdominal: Soft. Bowel sounds are normal. She exhibits no distension. There is no tenderness.  Musculoskeletal: She exhibits edema.  3+ pitting edema to knees b/l  Neurological: She is alert and oriented to person, place, and time.  Fluent speech  Skin: Skin is warm and dry.  Psychiatric: She has a normal mood and affect. Judgment normal.  Nursing note and vitals reviewed.   ED Course  Procedures (including critical care time) Labs Review Labs Reviewed  CBC WITH DIFFERENTIAL/PLATELET - Abnormal; Notable for the following:    WBC 13.2 (*)    Hemoglobin 10.4 (*)    HCT 33.6 (*)    Platelets 484 (*)    Neutro Abs 10.3 (*)    Monocytes Absolute 1.3 (*)    All other components within normal limits  BASIC METABOLIC PANEL - Abnormal; Notable for the following:     Chloride 96 (*)    BUN 24 (*)    Creatinine, Ser 1.42 (*)    GFR calc non Af Amer 31 (*)    GFR calc Af Amer 36 (*)    All other components within normal limits  BRAIN NATRIURETIC PEPTIDE - Abnormal; Notable for the following:    B Natriuretic Peptide 398.4 (*)    All other components within normal limits  PROTIME-INR - Abnormal; Notable for the following:    Prothrombin Time 27.6 (*)    INR 2.61 (*)    All other components within normal limits  I-STAT CHEM 8, ED - Abnormal; Notable for the following:    Chloride 94 (*)    BUN 30 (*)    Creatinine, Ser 1.40 (*)    Calcium, Ion 1.07 (*)    All other components within normal limits  DIGOXIN LEVEL  URINALYSIS, ROUTINE W REFLEX MICROSCOPIC (NOT AT [facility])  Randolm Idol, ED    Imaging Review Dg Chest 2 View  05/01/2015  CLINICAL DATA:  Increasing shortness of breath and fever. EXAM: CHEST  2 VIEW 3:18 p.m. COMPARISON:  Radiographs dated 05/01/2015 at 11:17 a.m. and 02/14/2015 FINDINGS: There is chronic cardiomegaly with new pulmonary vascular congestion with small right and tiny left pleural effusions. The patient has been demonstrate to have the pericardial effusion in the past year on no acute bone abnormality. Pacemaker in place. IMPRESSION: Findings consistent with congestive heart failure. Electronically Signed   By: Lorriane Shire M.D.   On: 05/01/2015 15:26   Dg Abd Acute W/chest  05/01/2015  CLINICAL DATA:  diverticulitis. Pt states that she has not had a fever in the past week but has recently noticed that her temperature rises to 99 in the evenings. Triage temperature: 98.5 F. Pt reports 5-6 small BMs daily. She denies abdominal pain at this time. Pt states that her GI physician is considering a colon resection. Pt had a ptinr done 2 days ago that was 5.74. SOB: Pt reports mild SOB, more than usual. O2 level ranges from 88-92. Triage SpO2: 90. Pt states that she was unable to walk upstairs last night due to SOB. EXAM: DG ABDOMEN  ACUTE W/ 1V CHEST COMPARISON:  CT 04/03/2015 and previous studies FINDINGS: Moderate cardiomegaly as before. Stable right  subclavian transvenous pacemaker. Chronic blunting of the left laterals costophrenic angle. Central pulmonary vascular congestion. Atheromatous aorta. No free air. Normal bowel gas pattern. Patchy aortoiliac arterial calcifications. Surgical clips in the right upper abdomen and right pelvis. Mild degenerative changes in the lumbar spine IMPRESSION: Stable cardiomegaly Negative abdominal radiographs. Electronically Signed   By: Lucrezia Europe M.D.   On: 05/01/2015 12:56   I have personally reviewed and evaluated these lab results as part of my medical decision-making.   EKG Interpretation   Date/Time:  Wednesday May 01 2015 14:53:58 EDT Ventricular Rate:  64 PR Interval:    QRS Duration: 150 QT Interval:  473 QTC Calculation: 488 R Axis:   -98 Text Interpretation:  Junctional rhythm Nonspecific IVCD with LAD Inferior  infarct, acute (RCA) Anterior infarct, acute Probable RV involvement,  suggest recording right precordial leads junctional rhythm  new from  previous ekg Confirmed by Jayde Daffin MD, Mallie Giambra [phone]) on 05/01/2015 3:33:39 PM     Medications  furosemide (LASIX) injection 40 mg (40 mg Intravenous Given 05/01/15 1716)    MDM   Final diagnoses:  Acute on chronic congestive heart failure, unspecified congestive heart failure type (HCC)  Hypoxia   PT w/ hx CHF p/w worsening SOB and hypoxia noted at the doctor's office today. On exam, she was mildly to With crackles in bilateral bases but in no respiratory distress. She denied any complaints of pain. O2 sat stable on 2 L. Obtained above lab work as well as chest x-ray and EKG. EKG shows wide complex rhythm, different from previous EKG but patient does report pacemaker history and denies any chest pain.  Chest x-ray consistent with CHF. Creatinine elevated at 1.42, troponin normal, WBC 13.2. Given patient's chest x-ray,  significant peripheral edema, and elevated creatinine, I suspect heart failure exacerbation. I gave the patient IV Lasix and discussed with cardiology who recommended medicine admission. Discussed admission with Family medicine teaching service and pt admitted to Dr. Verlon Au service for further care.   Sharlett Iles, MD 05/01/15 [phone]

## 2015-05-01 NOTE — Patient Instructions (Signed)
     IF you received an x-ray today, you will receive an invoice from [facility]. Please contact [facility] at [phone] with questions or concerns regarding your invoice.   IF you received labwork today, you will receive an invoice from [facility]. Please contact [facility] at [phone] with questions or concerns regarding your invoice.   Our billing staff will not be able to assist you with questions regarding bills from these companies.  You will be contacted with the lab results as soon as they are available. The fastest way to get your results is to activate your My Chart account. Instructions are located on the last page of this paperwork. If you have not heard from us regarding the results in 2 weeks, please contact this office.      

## 2015-05-01 NOTE — Progress Notes (Signed)
ANTICOAGULATION CONSULT NOTE - Initial Consult  Pharmacy Consult for coumadin Indication: atrial fibrillation  Allergies  Allergen Reactions  . Iodinated Diagnostic Agents Hives    HIVES 15MIN S/P IV CONTRAST INJECTION,WILL NEED 13 HR PREP FOR FUTURE INJECTIONS, ok s/p 50mg  po benadryl//a.calhoun  . Anti-Inflammatory Enzyme [Nutritional Supplements]     Retains fluids and headaches  . Arthrotec [Diclofenac-Misoprostol] Other (See Comments)    unknown  . Biaxin [Clarithromycin] Other (See Comments)    unknown  . Plavix [Clopidogrel Bisulfate] Other (See Comments)    unknown  . Spironolactone     Hair loss    Patient Measurements: Height: 5\' 5"  (165.1 cm) Weight: 164 lb 11.2 oz (74.707 kg) IBW/kg (Calculated) : 57  Vital Signs: Temp: 98.2 F (36.8 C) (04/05 1946) Temp Source: Oral (04/05 1946) BP: 118/49 mmHg (04/05 1946) Pulse Rate: 62 (04/05 1818)  Labs:  Recent Labs  04/29/15 05/01/15 1244 05/01/15 1545 05/01/15 1601  HGB  --  10.0* 10.4* 12.2  HCT  --  29.2* 33.6* 36.0  PLT  --   --  484*  --   LABPROT  --   --  27.6*  --   INR 5.74  --  2.61*  --   CREATININE  --   --  1.42* 1.40*    Estimated Creatinine Clearance: 27 mL/min (by C-G formula based on Cr of 1.4).   Medical History: Past Medical History  Diagnosis Date  . Pericardial effusion     a. s/p pericardial window 01/16/15  . Atrial fibrillation (HCC)     Chronic  . Chronic anticoagulation     on Coumadin  . History of GI bleed     once treated with Fe infusion  . HTN (hypertension)   . Pacemaker     a. Medtronic, placed for tachybrady.   . Obesity   . LVH (left ventricular hypertrophy)   . Sleep apnea     mild-no cpap  . Positive TB test   . Anemia   . Stroke [facility])     a. 2013: right frontal  . Arthritis   . Tachycardia-bradycardia syndrome [facility])     a. s/p Medtronic Sensia  model number [id number], serial number [id number] H 12/2013  . History of shingles   . Chronic diastolic  (congestive) heart failure ([facility])   . Chronic atrial fibrillation (HCC)     a. on coumadin, digoxin and calan  . GI bleed     a. 2/2 AVMs    Assessment: 82 yo F presents on 4/5 with SOB and fever. On coumadin 2.5mg  daily exc 5mg  on Sun/Tues/Thurs PTA for Afib. Last dose is unknown per patient sometime this last week. INR on admit is 2.61 but was 5.74 on 4/3 so most likely holding since then. Hgb ok at 12.2, plts 484.  Goal of Therapy:  INR 2-3 Monitor platelets by anticoagulation protocol: Yes   Plan:  Give coumadin 2.5mg  PO x 1 tonight Monitor daily INR, CBC, s/s of bleed  Elenor Quinones, [name], [facility] Clinical Pharmacist Pager [phone] 05/01/2015 8:51 PM

## 2015-05-01 NOTE — Progress Notes (Addendum)
Patient ID: [name], female   DOB: [birth year], 82 y.o.   MRN: [id number]    By signing my name below, I, Essence Howell, attest that this documentation has been prepared under the direction and in the presence of Darlyne Russian, MD Electronically Signed: Ladene Artist, ED Scribe 05/01/2015 at 1:10 PM .  Chief Complaint:  Chief Complaint  Patient presents with  . Follow-up    diverticulitis  . Shortness of Breath    oxygen level 88-92   HPI: [name] is a 82 y.o. female who reports to [facility] today for a follow-up regarding diverticulitis. Pt states that she has not had a fever in the past week but has recently noticed that her temperature rises to 99 in the evenings. Triage temperature: 98.5 F. Pt reports 5-6 small BMs daily. She denies abdominal pain at this time. Pt states that her GI physician is considering a colon resection. Pt had a ptinr done 2 days ago that was 5.74.   SOB Pt reports mild SOB, more than usual. O2 level ranges from 88-92. Triage SpO2: 90. Pt states that she was unable to walk upstairs last night due to SOB.   Back Pain Pt also presents with intermittent back pain. She requests pain medication at this visit.   Past Medical History  Diagnosis Date  . Pericardial effusion     a. s/p pericardial window 01/16/15  . Atrial fibrillation (HCC)     Chronic  . Chronic anticoagulation     on Coumadin  . History of GI bleed     once treated with Fe infusion  . HTN (hypertension)   . Pacemaker     a. Medtronic, placed for tachybrady.   . Obesity   . LVH (left ventricular hypertrophy)   . Sleep apnea     mild-no cpap  . Positive TB test   . Anemia   . Stroke [facility])     a. 2013: right frontal  . Arthritis   . Tachycardia-bradycardia syndrome [facility])     a. s/p Medtronic Sensia  model number [id number], serial number [id number] H 12/2013  . History of shingles   . Chronic diastolic (congestive) heart failure ([place])   . Chronic atrial fibrillation (HCC)    a. on coumadin, digoxin and calan  . GI bleed     a. 2/2 AVMs   Past Surgical History  Procedure Laterality Date  . Breast lumpectomy Bilateral     negative for cancer  . Esophagogastroduodenoscopy  05/19/2011    Procedure: ESOPHAGOGASTRODUODENOSCOPY (EGD);  Surgeon: Lear Ng, MD;  Location: [facility] ENDOSCOPY;  Service: Endoscopy;  Laterality: N/A;  doctor aware of inr   will try to be here no later than 230  . Cholecystectomy  2005  . Esophagogastroduodenoscopy  06/22/2011    Procedure: ESOPHAGOGASTRODUODENOSCOPY (EGD);  Surgeon: Arta Silence, MD;  Location: [facility] ENDOSCOPY;  Service: Endoscopy;  Laterality: N/A;  Check PT/INR in am  . Givens capsule study  06/23/2011    Procedure: GIVENS CAPSULE STUDY;  Surgeon: Arta Silence, MD;  Location: [facility] ENDOSCOPY;  Service: Endoscopy;  Laterality: N/A;  . Colonoscopy  08/11/2011    Procedure: COLONOSCOPY;  Surgeon: Jeryl Columbia, MD;  Location: [facility] ENDOSCOPY;  Service: Endoscopy;  Laterality: N/A;  . Hot hemostasis  08/11/2011    Procedure: HOT HEMOSTASIS (ARGON PLASMA COAGULATION/BICAP);  Surgeon: Jeryl Columbia, MD;  Location: [facility] ENDOSCOPY;  Service: Endoscopy;  Laterality: N/A;  . Mass excision Left 09/14/2013    Procedure:  EXCISION MASS LEFT WRIST;  Surgeon: Leanora Cover, MD;  Location: [place];  Service: Orthopedics;  Laterality: Left;  . Tonsillectomy and adenoidectomy  1950  . Insert / replace / remove pacemaker  2001    Last generator in 2006; interrogated Dec 2012  . Cataract extraction w/ intraocular lens  implant, bilateral Bilateral   . Lipoma excision Left 07/2013    wrist  . Pacemaker generator change N/A 01/02/2014    Procedure: PACEMAKER GENERATOR CHANGE;  Surgeon: Sanda Klein, MD;  Location: [place] CATH LAB;  Service: Cardiovascular;  Laterality: N/A;  . Lead revision N/A 01/02/2014    Procedure: LEAD REVISION;  Surgeon: Sanda Klein, MD;  Location: [place] CATH LAB;  Service: Cardiovascular;  Laterality: N/A;  .  Subxyphoid pericardial window N/A 01/16/2015    Procedure: SUBXYPHOID PERICARDIAL WINDOW;  Surgeon: Grace Isaac, MD;  Location: [place];  Service: Thoracic;  Laterality: N/A;  . Tee without cardioversion N/A 01/16/2015    Procedure: TRANSESOPHAGEAL ECHOCARDIOGRAM (TEE);  Surgeon: Grace Isaac, MD;  Location: [place];  Service: Thoracic;  Laterality: N/A;   Social History   Social History  . Marital Status: Widowed    Spouse Name: N/A  . Number of Children: N/A  . Years of Education: N/A   Social History Main Topics  . Smoking status: Never Smoker   . Smokeless tobacco: Never Used  . Alcohol Use: 0.0 oz/week    0 Standard drinks or equivalent per week     Comment: 01/02/2014 "I'll have a drink a few times/year"  . Drug Use: No  . Sexual Activity: No   Other Topics Concern  . None   Social History Narrative   Family History  Problem Relation Age of Onset  . Stroke Mother   . Stroke Father   . Pneumonia Father   . Colon cancer Sister   . Colon cancer Daughter    Allergies  Allergen Reactions  . Iodinated Diagnostic Agents Hives    HIVES 15MIN S/P IV CONTRAST INJECTION,WILL NEED 13 HR PREP FOR FUTURE INJECTIONS, ok s/p '50mg'$  po benadryl//a.calhoun  . Anti-Inflammatory Enzyme [Nutritional Supplements]     Retains fluids and headaches  . Arthrotec [Diclofenac-Misoprostol] Other (See Comments)    unknown  . Biaxin [Clarithromycin] Other (See Comments)    unknown  . Plavix [Clopidogrel Bisulfate] Other (See Comments)    unknown  . Spironolactone     Hair loss   Prior to Admission medications   Medication Sig Start Date End Date Taking? Authorizing Provider  Digoxin 62.5 MCG TABS Take 0.0625 mg by mouth daily. Take only 5 days a week; skip Wednesdays and Sundays 03/20/15  Yes Mihai Croitoru, MD  gabapentin (NEURONTIN) 100 MG capsule Take 1 capsule (100 mg total) by mouth 3 (three) times daily. 04/23/15  Yes Darlyne Russian, MD  potassium chloride (K-DUR,KLOR-CON) 10 MEQ  tablet Take 2 tablets (20 mEq total) by mouth 2 (two) times daily. Take with Torsemide 04/11/15  Yes Hillary Corinda Gubler, MD  torsemide (DEMADEX) 20 MG tablet Take 3 tablets (60 mg total) by mouth daily. 01/26/15  Yes Eileen Stanford, PA-C  verapamil (CALAN-SR) 180 MG CR tablet take 1 tablet by mouth at bedtime 03/26/15  Yes Mihai Croitoru, MD  warfarin (COUMADIN) 5 MG tablet Take 2.5-5 mg by mouth as directed. Takes 0.5 tablet (2.5 mg total) by mouth Monday, Wednesday, Friday, Saturday. Take 1 tablet (5 mg total) by mouth Sunday, Tuesday, Thursday.   Yes Historical Provider,  MD  hydrocortisone-pramoxine [facility]) 2.5-1 % rectal cream Place 1 application rectally 3 (three) times daily. Reported on 05/01/2015    Historical Provider, MD  ondansetron (ZOFRAN ODT) 4 MG disintegrating tablet Take 1 tablet (4 mg total) by mouth every 8 (eight) hours as needed for nausea or vomiting. Patient not taking: Reported on 05/01/2015 03/09/15   [facility], DO   ROS: The patient denies chills, night sweats, unintentional weight loss, chest pain, palpitations, wheezing, -abdominal pain, nausea, vomiting, dysuria, hematuria, melena, numbness, weakness, or tingling. +fever, +back pain, +SOB  All other systems have been reviewed and were otherwise negative with the exception of those mentioned in the HPI and as above.    PHYSICAL EXAM: Filed Vitals:   05/01/15 1157  BP: 122/68  Pulse: 52  Temp: 98.5 F (36.9 C)  Resp: 16   Body mass index is 27.46 kg/(m^2).  General: Alert, no acute distress HEENT:  Normocephalic, atraumatic, oropharynx patent. Eye: [facility], [facility] Cardiovascular: Irregular rate and rhythm, 2/6 systolic murmur in L sternal border Respiratory: Occasional rhonchi in bases. No wheezes or rales. No cyanosis, no use of accessory musculature Abdominal: Grapefruit size mass in abdomen in LLQ Musculoskeletal: Gait intact. No edema, tenderness Skin: No rashes. Neurologic: Facial  musculature symmetric. Psychiatric: Patient acts appropriately throughout our interaction. Lymphatic: No cervical or submandibular lymphadenopathy  LABS: Results for orders placed or performed in visit on 05/01/15  POCT CBC  Result Value Ref Range   WBC 13.6 (A) 4.6 - 10.2 K/uL   Lymph, poc 1.8 0.6 - 3.4   POC LYMPH PERCENT 13.6 10 - 50 %L   MID (cbc) 0.9 0 - 0.9   POC MID % 6.6 0 - 12 %M   POC Granulocyte 10.9 (A) 2 - 6.9   Granulocyte percent 79.8 37 - 80 %G   RBC 3.54 (A) 4.04 - 5.48 M/uL   Hemoglobin 10.0 (A) 12.2 - 16.2 g/dL   HCT, POC 36.2 (A) 52.2 - 47.9 %   MCV 82.5 80 - 97 fL   MCH, POC 28.2 27 - 31.2 pg   MCHC 34.1 31.8 - 35.4 g/dL   RDW, POC 61.6 %   Platelet Count, POC 468 (A) 142 - 424 K/uL   MPV 7.5 0 - 99.8 fL    EKG/XRAY:   Primary read interpreted by Dr. Cleta Alberts at [facility]. Ct Abdomen Pelvis Wo Contrast  04/03/2015  CLINICAL DATA:  Left-sided abdominal pain with nausea. EXAM: CT ABDOMEN AND PELVIS WITHOUT CONTRAST TECHNIQUE: Multidetector CT imaging of the abdomen and pelvis was performed following the standard protocol without IV contrast. COMPARISON:  03/08/2015.  08/06/2005. FINDINGS: Lower chest: Chronic interstitial changes in the lung bases. The heart is enlarged. Pericardial effusion is incompletely visualized and is stable to slightly increased in the interval, remaining small to moderate in size. Hepatobiliary: No focal abnormality in the liver on this study without intravenous contrast. No evidence of hepatomegaly. Gallbladder is surgically absent. No intrahepatic or extrahepatic biliary dilation. Pancreas: No focal mass lesion. No dilatation of the main duct. No intraparenchymal cyst. No peripancreatic edema. Spleen: No splenomegaly. No focal mass lesion. Adrenals/Urinary Tract: No adrenal nodule or mass. 8 mm hyper attenuating lesion in the interpolar right kidney is stable. 13 mm hyper attenuating lesion in the interpolar right kidney is stable. 2.3 cm exophytic  water density lesion in the lower pole the right kidney is unchanged remains compatible with a cyst. Stable appearing cyst in the interpolar left kidney. Apparent exophytic lesion upper  pole left kidney is seen to represent normal renal parenchyma with focal cortical scarring on the previous CT scan from 08/06/2005. No evidence for hydroureter. The urinary bladder appears normal for the degree of distention. Stomach/Bowel: Stomach is nondistended. No gastric wall thickening. No evidence of outlet obstruction. Duodenum is normally positioned as is the ligament of Treitz. No small bowel wall thickening. No small bowel dilatation. The terminal ileum is normal. The appendix is normal. Left colonic diverticulosis is evident. Pericolonic edema/ inflammation is seen around the distal descending colon and proximal sigmoid segment, suggesting acute diverticulitis. This pericolonic edema/inflammation is not substantially different than on the prior study and there is a stable small volume of fluid in the root of the sigmoid mesocolon. There is stable appearance of circumferential wall thickening in the mid sigmoid segment, likely related to muscular hypertrophy from underlying diverticulosis. Vascular/Lymphatic: There is abdominal aortic atherosclerosis without aneurysm. There is no gastrohepatic or hepatoduodenal ligament lymphadenopathy. No intraperitoneal or retroperitoneal lymphadenopathy. Small lymph nodes are identified in the sigmoid mesocolon. Reproductive: Uterus is surgically absent. There is no adnexal mass. Other: No intraperitoneal free fluid. Musculoskeletal: Bone windows reveal no worrisome lytic or sclerotic osseous lesions. IMPRESSION: 1. No substantial interval change in the pericolonic edema/inflammation associated with the distal descending and proximal sigmoid colon. Imaging features are compatible with acute diverticulitis although the lack of interval change may be related to persistent or recurrent  disease. No evidence for perforation or abscess. Given the persistence, underlying neoplastic involvement should be considered. 2. Bilateral renal cysts. High attenuation lesions in the right kidney may be related to proteinaceous or hemorrhagic cyst. Electronically Signed   By: Misty Stanley M.D.   On: 04/03/2015 17:54   Dg Abd Acute W/chest  05/01/2015  CLINICAL DATA:  diverticulitis. Pt states that she has not had a fever in the past week but has recently noticed that her temperature rises to 99 in the evenings. Triage temperature: 98.5 F. Pt reports 5-6 small BMs daily. She denies abdominal pain at this time. Pt states that her GI physician is considering a colon resection. Pt had a ptinr done 2 days ago that was 5.74. SOB: Pt reports mild SOB, more than usual. O2 level ranges from 88-92. Triage SpO2: 90. Pt states that she was unable to walk upstairs last night due to SOB. EXAM: DG ABDOMEN ACUTE W/ 1V CHEST COMPARISON:  CT 04/03/2015 and previous studies FINDINGS: Moderate cardiomegaly as before. Stable right subclavian transvenous pacemaker. Chronic blunting of the left laterals costophrenic angle. Central pulmonary vascular congestion. Atheromatous aorta. No free air. Normal bowel gas pattern. Patchy aortoiliac arterial calcifications. Surgical clips in the right upper abdomen and right pelvis. Mild degenerative changes in the lumbar spine IMPRESSION: Stable cardiomegaly Negative abdominal radiographs. Electronically Signed   By: Lucrezia Europe M.D.   On: 05/01/2015 12:56   ESR 86   ASSESSMENT/PLAN: White count up to 13,600. She is definitely more short of breath than usual. Her oxygen level was 90%. She has a history of C. difficile. Unclear whether this is secondary to C. difficile or secondary to recurrent diverticulitis or secondary to an occult neoplasm in the colon.I personally performed the services described in this documentation, which was scribed in my presence. The recorded information has been  reviewed and is accurate. I tried to direct admit to the family medicine service but no beds were available. I spoke with Dr. Jerline Pain. No bed available for direct admit. Patient will be sent to the ER.  Gross sideeffects, risk and benefits, and alternatives of medications d/w patient. Patient is aware that all medications have potential sideeffects and we are unable to predict every sideeffect or drug-drug interaction that may occur.  [name] MD 05/01/2015 1:10 PM

## 2015-05-01 NOTE — ED Notes (Signed)
Gave patient some ice water

## 2015-05-02 ENCOUNTER — Encounter (HOSPITAL_COMMUNITY): Payer: Self-pay | Admitting: General Practice

## 2015-05-02 ENCOUNTER — Inpatient Hospital Stay (HOSPITAL_COMMUNITY): Payer: Medicare Other

## 2015-05-02 DIAGNOSIS — R0602 Shortness of breath: Secondary | ICD-10-CM

## 2015-05-02 DIAGNOSIS — I5033 Acute on chronic diastolic (congestive) heart failure: Secondary | ICD-10-CM

## 2015-05-02 DIAGNOSIS — K5780 Diverticulitis of intestine, part unspecified, with perforation and abscess without bleeding: Secondary | ICD-10-CM | POA: Diagnosis present

## 2015-05-02 DIAGNOSIS — K5792 Diverticulitis of intestine, part unspecified, without perforation or abscess without bleeding: Secondary | ICD-10-CM

## 2015-05-02 DIAGNOSIS — K631 Perforation of intestine (nontraumatic): Secondary | ICD-10-CM

## 2015-05-02 LAB — PROTIME-INR
INR: 2.45 — AB (ref 0.00–1.49)
PROTHROMBIN TIME: 26.3 s — AB (ref 11.6–15.2)

## 2015-05-02 LAB — TROPONIN I
TROPONIN I: 0.03 ng/mL (ref <0.031)
TROPONIN I: 0.04 ng/mL — AB (ref <0.031)

## 2015-05-02 LAB — URINE MICROSCOPIC-ADD ON

## 2015-05-02 LAB — URINALYSIS, ROUTINE W REFLEX MICROSCOPIC
Bilirubin Urine: NEGATIVE
GLUCOSE, UA: NEGATIVE mg/dL
KETONES UR: NEGATIVE mg/dL
NITRITE: POSITIVE — AB
PH: 6.5 (ref 5.0–8.0)
Protein, ur: NEGATIVE mg/dL
SPECIFIC GRAVITY, URINE: 1.009 (ref 1.005–1.030)

## 2015-05-02 LAB — BASIC METABOLIC PANEL
ANION GAP: 14 (ref 5–15)
BUN: 26 mg/dL — ABNORMAL HIGH (ref 6–20)
CALCIUM: 8.8 mg/dL — AB (ref 8.9–10.3)
CO2: 30 mmol/L (ref 22–32)
Chloride: 93 mmol/L — ABNORMAL LOW (ref 101–111)
Creatinine, Ser: 1.42 mg/dL — ABNORMAL HIGH (ref 0.44–1.00)
GFR, EST AFRICAN AMERICAN: 36 mL/min — AB (ref >60)
GFR, EST NON AFRICAN AMERICAN: 31 mL/min — AB (ref >60)
Glucose, Bld: 120 mg/dL — ABNORMAL HIGH (ref 65–99)
Potassium: 4.4 mmol/L (ref 3.5–5.1)
Sodium: 137 mmol/L (ref 135–145)

## 2015-05-02 MED ORDER — FUROSEMIDE 10 MG/ML IJ SOLN
40.0000 mg | Freq: Once | INTRAMUSCULAR | Status: AC
  Administered 2015-05-02: 40 mg via INTRAVENOUS
  Filled 2015-05-02: qty 4

## 2015-05-02 MED ORDER — WARFARIN SODIUM 2.5 MG PO TABS
2.5000 mg | ORAL_TABLET | Freq: Once | ORAL | Status: AC
  Administered 2015-05-02: 2.5 mg via ORAL
  Filled 2015-05-02: qty 1

## 2015-05-02 MED ORDER — DEXTROSE 5 % IV SOLN
2.0000 g | INTRAVENOUS | Status: DC
  Administered 2015-05-02 – 2015-05-06 (×5): 2 g via INTRAVENOUS
  Filled 2015-05-02 (×5): qty 2

## 2015-05-02 MED ORDER — FUROSEMIDE 10 MG/ML IJ SOLN
40.0000 mg | Freq: Four times a day (QID) | INTRAMUSCULAR | Status: DC
  Administered 2015-05-02 – 2015-05-03 (×4): 40 mg via INTRAVENOUS
  Filled 2015-05-02 (×6): qty 4

## 2015-05-02 MED ORDER — ACETAMINOPHEN 325 MG PO TABS
650.0000 mg | ORAL_TABLET | Freq: Four times a day (QID) | ORAL | Status: DC | PRN
  Administered 2015-05-03: 650 mg via ORAL
  Filled 2015-05-02 (×2): qty 2

## 2015-05-02 MED ORDER — METRONIDAZOLE IN NACL 5-0.79 MG/ML-% IV SOLN
500.0000 mg | Freq: Three times a day (TID) | INTRAVENOUS | Status: AC
  Administered 2015-05-02 – 2015-05-06 (×13): 500 mg via INTRAVENOUS
  Filled 2015-05-02 (×14): qty 100

## 2015-05-02 MED ORDER — BARIUM SULFATE 2.1 % PO SUSP
900.0000 mL | ORAL | Status: DC
  Administered 2015-05-02 (×2): 900 mL via ORAL

## 2015-05-02 NOTE — Telephone Encounter (Signed)
Spoke with Melissa at [facility], she clarified that patient is NOT having colonoscopy on April 17, but just sigmoid colonoscopy on May 4.  Will coordinate accordingly with patient.

## 2015-05-02 NOTE — Progress Notes (Signed)
Pt refused bed alarm. Pt states she will be fine going to the bedside commode by herself, however this RN has instructed the patient to call for assistance if she plans to ambulate any further. Will continue to monitor.

## 2015-05-02 NOTE — Care Management Note (Signed)
Case Management Note  Patient Details  Name: [name] MRN: [id number] Date of Birth: 08-Jul-1923  Subjective/Objective:    Admitted with CHF                Action/Plan: CM talked to patient with daughter at bedside, patient lives alone, does not use any DME but states that she has a walker at home that she can use if she needs it. PCP is Dr Everlene Farrier; Private insurance with Medicare and BCBS with prescription drug coverage; [facility]; patient reports no problem getting her medication. Patient also states that she drives when her daughter will allow her too. No needs identified at this time. CM will continue to follow for DCP;   Expected Discharge Date:    possibly 05/04/2015              Expected Discharge Plan:  Home/Self Care  Discharge planning Services  CM Consult  Choice offered to:  NA    Status of Service:  In process, will continue to follow  Royston Bake [place], [place] [phone]  05/02/2015, 1:35 PM

## 2015-05-02 NOTE — Progress Notes (Signed)
Family Medicine Teaching Service Daily Progress Note Intern Pager: [phone]  Patient name: [name] Medical record number: [id number] Date of birth: 07-20-1923 Age: 82 y.o. Gender: female  Primary Care Provider: [name], MD Consultants: card Code Status: full  Pt Overview and Major Events to Date:  04/05-admitted with CHF exacerbation.  Assessment and Plan: [name] is a 82 y.o. female presenting with shortness of breath and fever. PMH is significant for atrial fibrillation, hx of CVA, hypertension, type 2 diabetes, tachycardia-bradycardia syndrome s/p pacemaker, prior pericardial effusion s/p pericardial window 12/16, sleep apnea (no CPAP), stroke (right frontal 2013), GI bleeding with hemorrhoids, and arthritis and recurrent diverticulitis.  Dyspnea: improved. Likely 2/2 CHF exacerbation. ECHO in 01/2015 with EF 0000000, no diastolic dysfunction. Mod RAE, LAE and PAPP of 38.Continues to have significant bilateral edema to hip. JVD and crackles improved. -s/p IV lasix 40 mg x2. UOP 1 ml/kg/hr, net -685 mls. -Will continue IV lasix 40 mg x3 -BMP daily -hold home toresamide -Strict I&O and daily weight -Card consulted over the phone: not impressed with EKG given ventricular pacing in Afib -Consider repeat Echo -wean oxygen as needed.  AKI: Cr 1.4>1.42. BUN 30. Cr 1.0 on discharge on 03/16. Likely secondary to Torsemide. Could also be secondary to CHF (cardiorenal syndrome) -Will monitor BMP   #Diverticulitis: completed course of antibiotics after discharge. Reports fever at home. Afebrile here. Palpable mass over LLQ this admission not seen on CT during prior admission.  -CT abdomen 04/06 improved sigmoid colon inflammation but there is a new contained perforation into the sigmoid mesentery. No drainable fluid collection. Stool retention above the thickened sigmoid colon. Also volume overload - holding off on cipro/flagyl given afebrile and well appearing here.  -  consult surgery about contained sigmoid perforation on CT and blood in stool.  # Atrial fibrillation:  Digoxin level 0.8, normal today. INR therapeutic -Continue Verapamil and Digoxin  -Continue warfarin per pharmacy -Daily PT/INR  #Anemia: hgb 9.5, stable. (baseline 11). MCV 84% argues against IDA. History of diverticulitis and hemorrhoids - monitor for GI bleed  FEN/GI: -heart healthy -saline lock  Prophylaxis: on warfarin for Afib  Disposition: regular floor with telemetry.  Subjective:  Reports her breathing is better. Denies chest pain. Continues to have blood in stool. Eating and drinking well  Objective: Temp:  [98 F (36.7 C)-98.6 F (37 C)] 98 F (36.7 C) (04/06 0553) Pulse Rate:  [52-68] 68 (04/06 0553) Resp:  [16-33] 16 (04/06 0553) BP: (100-142)/(44-89) 100/44 mmHg (04/06 0553) SpO2:  [90 %-100 %] 94 % (04/06 0553) Weight:  [164 lb 11.2 oz (74.707 kg)-168 lb (76.204 kg)] 165 lb (74.844 kg) (04/06 0553)  Physical Exam: Gen: appears well, sitting on bed eating breakfast, pleasant Neck: supple, no LAD, JVD+, no hepatojugular reflux CV: regular rate and rythm. S1 & S2 audible, 2/6 murmurs over right and left sternal border, 2/3+ pitting edema bilaterally up to the knees, JVD+, no hepatojugular reflux Resp: on 2L by [place], no apparent work of breathing, mild crackles over lower lung fields.  GI: bowel sounds normal, mild tenderness and tennis ball size mass in the LLQ.   GU: no suprapubic tenderness Skin: no lesions Neuro: alert oriented without gross deficit  Laboratory:  Recent Labs Lab 05/01/15 1244 05/01/15 1545 05/01/15 1601 05/02/15 0134  WBC 13.6* 13.2*  --  11.1*  HGB 10.0* 10.4* 12.2 9.5*  HCT 29.2* 33.6* 36.0 31.2*  PLT  --  484*  --  519*  Recent Labs Lab 05/01/15 1237 05/01/15 1545 05/01/15 1601  NA 139 138 138  K 5.2 4.3 4.1  CL 98 96* 94*  CO2 32* 30  --   BUN 28* 24* 30*  CREATININE 1.51* 1.42* 1.40*  CALCIUM 9.1 9.1  --   PROT  6.7  --   --   BILITOT 0.7  --   --   ALKPHOS 118  --   --   ALT 22  --   --   AST 37*  --   --   GLUCOSE 109* 97 96    Imaging/Diagnostic Tests: Ct Abdomen Pelvis Wo Contrast  05/02/2015  CLINICAL DATA:  Left lower quadrant abdominal mass.  Diverticulitis EXAM: CT ABDOMEN AND PELVIS WITHOUT CONTRAST TECHNIQUE: Multidetector CT imaging of the abdomen and pelvis was performed following the standard protocol without IV contrast. COMPARISON:  04/03/2015 FINDINGS: Lower chest and abdominal wall: Marked cardiomegaly with right ventricular pacer leads. Large pericardial effusion, chronic and water density. New small and layering pleural effusions, greater on the right. Basilar atelectasis. Anasarca Hepatobiliary: No focal liver abnormality.Cholecystectomy. No bile duct distention. Pancreas: Unremarkable. Spleen: Unremarkable. Adrenals/Urinary Tract: Negative adrenals. No hydronephrosis or stone. Sub cm -density lesions in the right kidney are likely hemorrhagic cysts. Smaller high-density foci in the left kidney are likely the same. Apparent 18 mm soft tissue density nodule exophytic from the upper pole left kidney is scarring based on 2007 CT . Reproductive:No pathologic findings. Stomach/Bowel: Inflammatory changes at the level of the distal descending and sigmoid colon are improved. This is again favored to reflect sigmoid diverticulitis, but mass lesion cannot be excluded, especially by noncontrast technique. There is new cluster of gas bubbles within the sigmoid colonic mesentery, central to the residual inflammation, with largest bubble measuring 7 mm. No drainable fluid collection. There is progressive stool retention above the sigmoid level. No small bowel distention or obstruction. No appendicitis. Vascular/Lymphatic: Distended venous structures. No acute vascular abnormality. No mass or adenopathy. Peritoneal: Small ascites. Musculoskeletal: No acute abnormalities. IMPRESSION: 1. Compared to 04/03/2015  sigmoid colon inflammation is improved but there is a new contained perforation into the sigmoid mesentery. No drainable fluid collection. 2. Stool retention above the thickened sigmoid colon. 3. Volume overload. Electronically Signed   By: Monte Fantasia M.D.   On: 05/02/2015 07:41   Dg Chest 2 View  05/01/2015  CLINICAL DATA:  Increasing shortness of breath and fever. EXAM: CHEST  2 VIEW 3:18 p.m. COMPARISON:  Radiographs dated 05/01/2015 at 11:17 a.m. and 02/14/2015 FINDINGS: There is chronic cardiomegaly with new pulmonary vascular congestion with small right and tiny left pleural effusions. The patient has been demonstrate to have the pericardial effusion in the past year on no acute bone abnormality. Pacemaker in place. IMPRESSION: Findings consistent with congestive heart failure. Electronically Signed   By: Lorriane Shire M.D.   On: 05/01/2015 15:26   Dg Abd Acute W/chest  05/01/2015  CLINICAL DATA:  diverticulitis. Pt states that she has not had a fever in the past week but has recently noticed that her temperature rises to 99 in the evenings. Triage temperature: 98.5 F. Pt reports 5-6 small BMs daily. She denies abdominal pain at this time. Pt states that her GI physician is considering a colon resection. Pt had a ptinr done 2 days ago that was 5.74. SOB: Pt reports mild SOB, more than usual. O2 level ranges from 88-92. Triage SpO2: 90. Pt states that she was unable to walk upstairs last night due  to SOB. EXAM: DG ABDOMEN ACUTE W/ 1V CHEST COMPARISON:  CT 04/03/2015 and previous studies FINDINGS: Moderate cardiomegaly as before. Stable right subclavian transvenous pacemaker. Chronic blunting of the left laterals costophrenic angle. Central pulmonary vascular congestion. Atheromatous aorta. No free air. Normal bowel gas pattern. Patchy aortoiliac arterial calcifications. Surgical clips in the right upper abdomen and right pelvis. Mild degenerative changes in the lumbar spine IMPRESSION: Stable  cardiomegaly Negative abdominal radiographs. Electronically Signed   By: Lucrezia Europe M.D.   On: 05/01/2015 12:56    Mercy Riding, MD 05/02/2015, 7:19 AM PGY-1, [place] Intern pager: [phone], text pages welcome

## 2015-05-02 NOTE — Progress Notes (Signed)
ANTICOAGULATION CONSULT NOTE - Follow Up Consult  Pharmacy Consult for Coumadin Indication: atrial fibrillation  Allergies  Allergen Reactions  . Iodinated Diagnostic Agents Hives    HIVES 15MIN S/P IV CONTRAST INJECTION,WILL NEED 13 HR PREP FOR FUTURE INJECTIONS, ok s/p 50mg  po benadryl//a.calhoun  . Anti-Inflammatory Enzyme [Nutritional Supplements]     Retains fluids and headaches  . Arthrotec [Diclofenac-Misoprostol] Other (See Comments)    unknown  . Biaxin [Clarithromycin] Other (See Comments)    unknown  . Plavix [Clopidogrel Bisulfate] Other (See Comments)    unknown  . Spironolactone     Hair loss    Patient Measurements: Height: 5\' 5"  (165.1 cm) Weight: 165 lb (74.844 kg) (scale c) IBW/kg (Calculated) : 57  Vital Signs: Temp: 98 F (36.7 C) (04/06 0553) Temp Source: Oral (04/06 0553) BP: 100/44 mmHg (04/06 0553) Pulse Rate: 62 (04/06 1050)  Labs:  Recent Labs  05/01/15 1545 05/01/15 1601 05/01/15 2053 05/02/15 0134 05/02/15 0725  HGB 10.4* 12.2  --  9.5*  --   HCT 33.6* 36.0  --  31.2*  --   PLT 484*  --   --  519*  --   LABPROT 27.6*  --   --  26.3*  --   INR 2.61*  --   --  2.45*  --   CREATININE 1.42* 1.40*  --   --  1.42*  TROPONINI  --   --  0.03 0.03 0.04*    Estimated Creatinine Clearance: 26.6 mL/min (by C-G formula based on Cr of 1.42).   Medications:  Scheduled:  . Barium Sulfate  900 mL Oral UD  . digoxin  0.0625 mg Oral Once per day on Mon Tue Thu Fri  . gabapentin  100 mg Oral TID  . sodium chloride flush  3 mL Intravenous Q12H  . verapamil  180 mg Oral QHS  . Warfarin - Pharmacist Dosing Inpatient   Does not apply q1800    Assessment: 82 yo F on Coumadin PTA for hx of afib.  INR was recently elevated outpatient and Coumadin doses were held.  Today INR is 2.45 (therapeutic).  Hgb relatively stable 10.4 > 9.5.  Per MD notes, continues to have blood in stools thought due to hemorrhoids.  Will continue to monitor.  Goal of  Therapy:  INR 2-3 Monitor platelets by anticoagulation protocol: Yes   Plan:  Coumadin 2.5mg  PO x 1 tonight. Daily INR  [employer], Pharm.D., BCPS Clinical Pharmacist Pager [phone] 05/02/2015 11:27 AM

## 2015-05-02 NOTE — Consult Note (Signed)
Reason for Consult: sigmoid diverticulitis with contained perforation  Referring Physician: Dr. Dorcas Mcmurray   HPI: [name] is a 82 year old female with a history of CHF, atrial fibrillation s/p pacemaker, on coumadin, CVA, HTN and DM II who presented with shortness of breath and fevers of 100.2.     She developed constipation and BRBPR back in September. She was found to have internal and external hemorrhoids s/p rubber band ligation by Dr. Joyice Faster. She then developed mild and intermittent LLQ abdominal pain in December. She continued to have constipation and bleeding. Her pain acutely worsened which required admission 2/10-2/11. CT at this time demonstrated acute diverticulitis. she improved quickly and was discharge home.  She was readmitted again 04/03/15-04/11/15 at which time we were consulted.   Ct scan of abdomen and pelvis 3/8 showed persistent pericolonic inflammation. antibiotics were changed to invanz and antifungal, she improved, but developed c diff colitis.  She was then discharged to follow up with Dr. Dalbert Batman, however, decided to see Dr. Watt Climes instead.  Since discharge, she was feeling fairly good from a abdominal standpoint.  Denies any abdominal pain, nausea and vomiting.  Her appetite is okay.  She is now constipated.  Denies nausea or vomiting.  Appetite is fair.  CT today revealed improved sigmoid colon inflammation, but revealed a new contained perforation into the sigmoid mesentery without any drainable fluid collections.    The patient last had a colonoscopy in July 2013 by Dr. Watt Climes which revealed diverticuli and cecal AVMs.  She is scheduled for a sigmoidoscopy 5/417 with Dr. Watt Climes.      Past Medical History  Diagnosis Date  . Pericardial effusion     a. s/p pericardial window 01/16/15  . Atrial fibrillation (HCC)     Chronic  . Chronic anticoagulation     on Coumadin  . History of GI bleed     once treated with Fe infusion  . HTN (hypertension)   .  Pacemaker     a. Medtronic, placed for tachybrady.   . Obesity   . LVH (left ventricular hypertrophy)   . Sleep apnea     mild-no cpap  . Positive TB test   . Anemia   . Stroke [facility])     a. 2013: right frontal  . Arthritis   . Tachycardia-bradycardia syndrome [facility])     a. s/p Medtronic Sensia  model number [id number], serial number [id number] H 12/2013  . History of shingles   . Chronic diastolic (congestive) heart failure ([place])   . Chronic atrial fibrillation (HCC)     a. on coumadin, digoxin and calan  . GI bleed     a. 2/2 AVMs    Past Surgical History  Procedure Laterality Date  . Breast lumpectomy Bilateral     negative for cancer  . Esophagogastroduodenoscopy  05/19/2011    Procedure: ESOPHAGOGASTRODUODENOSCOPY (EGD);  Surgeon: Lear Ng, MD;  Location: [facility] ENDOSCOPY;  Service: Endoscopy;  Laterality: N/A;  doctor aware of inr   will try to be here no later than 230  . Cholecystectomy  2005  . Esophagogastroduodenoscopy  06/22/2011    Procedure: ESOPHAGOGASTRODUODENOSCOPY (EGD);  Surgeon: Arta Silence, MD;  Location: [facility] ENDOSCOPY;  Service: Endoscopy;  Laterality: N/A;  Check PT/INR in am  . Givens capsule study  06/23/2011    Procedure: GIVENS CAPSULE STUDY;  Surgeon: Arta Silence, MD;  Location: [facility] ENDOSCOPY;  Service: Endoscopy;  Laterality: N/A;  . Colonoscopy  08/11/2011    Procedure: COLONOSCOPY;  Surgeon: Jeryl Columbia, MD;  Location: [facility] ENDOSCOPY;  Service: Endoscopy;  Laterality: N/A;  . Hot hemostasis  08/11/2011    Procedure: HOT HEMOSTASIS (ARGON PLASMA COAGULATION/BICAP);  Surgeon: Jeryl Columbia, MD;  Location: [facility] ENDOSCOPY;  Service: Endoscopy;  Laterality: N/A;  . Mass excision Left 09/14/2013    Procedure: EXCISION MASS LEFT WRIST;  Surgeon: Leanora Cover, MD;  Location: [place];  Service: Orthopedics;  Laterality: Left;  . Tonsillectomy and adenoidectomy  1950  . Insert / replace / remove pacemaker  2001    Last generator in 2006;  interrogated Dec 2012  . Cataract extraction w/ intraocular lens  implant, bilateral Bilateral   . Lipoma excision Left 07/2013    wrist  . Pacemaker generator change N/A 01/02/2014    Procedure: PACEMAKER GENERATOR CHANGE;  Surgeon: Sanda Klein, MD;  Location: [place] CATH LAB;  Service: Cardiovascular;  Laterality: N/A;  . Lead revision N/A 01/02/2014    Procedure: LEAD REVISION;  Surgeon: Sanda Klein, MD;  Location: [place] CATH LAB;  Service: Cardiovascular;  Laterality: N/A;  . Subxyphoid pericardial window N/A 01/16/2015    Procedure: SUBXYPHOID PERICARDIAL WINDOW;  Surgeon: Grace Isaac, MD;  Location: [place];  Service: Thoracic;  Laterality: N/A;  . Tee without cardioversion N/A 01/16/2015    Procedure: TRANSESOPHAGEAL ECHOCARDIOGRAM (TEE);  Surgeon: Grace Isaac, MD;  Location: [facility] OR;  Service: Thoracic;  Laterality: N/A;    Family History  Problem Relation Age of Onset  . Stroke Mother   . Stroke Father   . Pneumonia Father   . Colon cancer Sister   . Colon cancer Daughter     Social History:  reports that she has never smoked. She has never used smokeless tobacco. She reports that she drinks alcohol. She reports that she does not use illicit drugs.  Allergies:  Allergies  Allergen Reactions  . Iodinated Diagnostic Agents Hives    HIVES 15MIN S/P IV CONTRAST INJECTION,WILL NEED 13 HR PREP FOR FUTURE INJECTIONS, ok s/p '50mg'$  po benadryl//a.calhoun  . Anti-Inflammatory Enzyme [Nutritional Supplements]     Retains fluids and headaches  . Arthrotec [Diclofenac-Misoprostol] Other (See Comments)    unknown  . Biaxin [Clarithromycin] Other (See Comments)    unknown  . Plavix [Clopidogrel Bisulfate] Other (See Comments)    unknown  . Spironolactone     Hair loss    Medications: Scheduled Meds: . Barium Sulfate  900 mL Oral UD  . cefTRIAXone (ROCEPHIN)  IV  2 g Intravenous Q24H  . digoxin  0.0625 mg Oral Once per day on Mon Tue Thu Fri  . furosemide  40 mg Intravenous  Q6H  . gabapentin  100 mg Oral TID  . metronidazole  500 mg Intravenous Q8H  . sodium chloride flush  3 mL Intravenous Q12H  . verapamil  180 mg Oral QHS  . warfarin  2.5 mg Oral ONCE-1800  . Warfarin - Pharmacist Dosing Inpatient   Does not apply q1800   Continuous Infusions:  PRN Meds:.   Results for orders placed or performed during the hospital encounter of 05/01/15 (from the past 48 hour(s))  CBC with Differential     Status: Abnormal   Collection Time: 05/01/15  3:45 PM  Result Value Ref Range   WBC 13.2 (H) 4.0 - 10.5 K/uL   RBC 3.90 3.87 - 5.11 MIL/uL   Hemoglobin 10.4 (L) 12.0 - 15.0 g/dL   HCT 33.6 (L) 36.0 - 46.0 %   MCV 86.2 78.0 -  100.0 fL   MCH 26.7 26.0 - 34.0 pg   MCHC 31.0 30.0 - 36.0 g/dL   RDW 14.4 11.5 - 15.5 %   Platelets 484 (H) 150 - 400 K/uL   Neutrophils Relative % 78 %   Lymphocytes Relative 12 %   Monocytes Relative 10 %   Eosinophils Relative 0 %   Basophils Relative 0 %   Neutro Abs 10.3 (H) 1.7 - 7.7 K/uL   Lymphs Abs 1.6 0.7 - 4.0 K/uL   Monocytes Absolute 1.3 (H) 0.1 - 1.0 K/uL   Eosinophils Absolute 0.0 0.0 - 0.7 K/uL   Basophils Absolute 0.0 0.0 - 0.1 K/uL   RBC Morphology POLYCHROMASIA PRESENT   Basic metabolic panel     Status: Abnormal   Collection Time: 05/01/15  3:45 PM  Result Value Ref Range   Sodium 138 135 - 145 mmol/L   Potassium 4.3 3.5 - 5.1 mmol/L   Chloride 96 (L) 101 - 111 mmol/L   CO2 30 22 - 32 mmol/L   Glucose, Bld 97 65 - 99 mg/dL   BUN 24 (H) 6 - 20 mg/dL   Creatinine, Ser 1.42 (H) 0.44 - 1.00 mg/dL   Calcium 9.1 8.9 - 10.3 mg/dL   GFR calc non Af Amer 31 (L) >60 mL/min   GFR calc Af Amer 36 (L) >60 mL/min    Comment: (NOTE) The eGFR has been calculated using the CKD EPI equation. This calculation has not been validated in all clinical situations. eGFR's persistently <60 mL/min signify possible Chronic Kidney Disease.    Anion gap 12 5 - 15  Brain natriuretic peptide     Status: Abnormal   Collection Time:  05/01/15  3:45 PM  Result Value Ref Range   B Natriuretic Peptide 398.4 (H) 0.0 - 100.0 pg/mL  Protime-INR     Status: Abnormal   Collection Time: 05/01/15  3:45 PM  Result Value Ref Range   Prothrombin Time 27.6 (H) 11.6 - 15.2 seconds   INR 2.61 (H) 0.00 - 1.49  Digoxin level     Status: None   Collection Time: 05/01/15  3:45 PM  Result Value Ref Range   Digoxin Level 0.8 0.8 - 2.0 ng/mL  I-stat troponin, ED     Status: None   Collection Time: 05/01/15  3:59 PM  Result Value Ref Range   Troponin i, poc 0.00 0.00 - 0.08 ng/mL   Comment 3            Comment: Due to the release kinetics of cTnI, a negative result within the first hours of the onset of symptoms does not rule out myocardial infarction with certainty. If myocardial infarction is still suspected, repeat the test at appropriate intervals.   I-stat Chem 8, ED     Status: Abnormal   Collection Time: 05/01/15  4:01 PM  Result Value Ref Range   Sodium 138 135 - 145 mmol/L   Potassium 4.1 3.5 - 5.1 mmol/L   Chloride 94 (L) 101 - 111 mmol/L   BUN 30 (H) 6 - 20 mg/dL   Creatinine, Ser 1.40 (H) 0.44 - 1.00 mg/dL   Glucose, Bld 96 65 - 99 mg/dL   Calcium, Ion 1.07 (L) 1.13 - 1.30 mmol/L   TCO2 31 0 - 100 mmol/L   Hemoglobin 12.2 12.0 - 15.0 g/dL   HCT 36.0 36.0 - 46.0 %  Troponin I     Status: None   Collection Time: 05/01/15  8:53 PM  Result  Value Ref Range   Troponin I 0.03 <0.031 ng/mL    Comment:        NO INDICATION OF MYOCARDIAL INJURY.   Urinalysis, Routine w reflex microscopic     Status: Abnormal   Collection Time: 05/02/15  1:32 AM  Result Value Ref Range   Color, Urine YELLOW YELLOW   APPearance CLOUDY (A) CLEAR   Specific Gravity, Urine 1.009 1.005 - 1.030   pH 6.5 5.0 - 8.0   Glucose, UA NEGATIVE NEGATIVE mg/dL   Hgb urine dipstick SMALL (A) NEGATIVE   Bilirubin Urine NEGATIVE NEGATIVE   Ketones, ur NEGATIVE NEGATIVE mg/dL   Protein, ur NEGATIVE NEGATIVE mg/dL   Nitrite POSITIVE (A) NEGATIVE    Leukocytes, UA SMALL (A) NEGATIVE  Urine microscopic-add on     Status: Abnormal   Collection Time: 05/02/15  1:32 AM  Result Value Ref Range   Squamous Epithelial / LPF 0-5 (A) NONE SEEN   WBC, UA 6-30 0 - 5 WBC/hpf   RBC / HPF 6-30 0 - 5 RBC/hpf   Bacteria, UA MANY (A) NONE SEEN   Casts HYALINE CASTS (A) NEGATIVE  Troponin I     Status: None   Collection Time: 05/02/15  1:34 AM  Result Value Ref Range   Troponin I 0.03 <0.031 ng/mL    Comment:        NO INDICATION OF MYOCARDIAL INJURY.   Protime-INR     Status: Abnormal   Collection Time: 05/02/15  1:34 AM  Result Value Ref Range   Prothrombin Time 26.3 (H) 11.6 - 15.2 seconds   INR 2.45 (H) 0.00 - 1.49  CBC     Status: Abnormal   Collection Time: 05/02/15  1:34 AM  Result Value Ref Range   WBC 11.1 (H) 4.0 - 10.5 K/uL   RBC 3.61 (L) 3.87 - 5.11 MIL/uL   Hemoglobin 9.5 (L) 12.0 - 15.0 g/dL    Comment: DELTA CHECK NOTED   HCT 31.2 (L) 36.0 - 46.0 %   MCV 86.4 78.0 - 100.0 fL   MCH 26.3 26.0 - 34.0 pg   MCHC 30.4 30.0 - 36.0 g/dL   RDW 14.4 11.5 - 15.5 %   Platelets 519 (H) 150 - 400 K/uL  Troponin I     Status: Abnormal   Collection Time: 05/02/15  7:25 AM  Result Value Ref Range   Troponin I 0.04 (H) <0.031 ng/mL    Comment:        PERSISTENTLY INCREASED TROPONIN VALUES IN THE RANGE OF 0.04-0.49 ng/mL CAN BE SEEN IN:       -UNSTABLE ANGINA       -CONGESTIVE HEART FAILURE       -MYOCARDITIS       -CHEST TRAUMA       -ARRYHTHMIAS       -LATE PRESENTING MYOCARDIAL INFARCTION       -COPD   CLINICAL FOLLOW-UP RECOMMENDED.   Basic metabolic panel     Status: Abnormal   Collection Time: 05/02/15  7:25 AM  Result Value Ref Range   Sodium 137 135 - 145 mmol/L   Potassium 4.4 3.5 - 5.1 mmol/L   Chloride 93 (L) 101 - 111 mmol/L   CO2 30 22 - 32 mmol/L   Glucose, Bld 120 (H) 65 - 99 mg/dL   BUN 26 (H) 6 - 20 mg/dL   Creatinine, Ser 1.42 (H) 0.44 - 1.00 mg/dL   Calcium 8.8 (L) 8.9 - 10.3 mg/dL   GFR  calc non Af  Amer 31 (L) >60 mL/min   GFR calc Af Amer 36 (L) >60 mL/min    Comment: (NOTE) The eGFR has been calculated using the CKD EPI equation. This calculation has not been validated in all clinical situations. eGFR's persistently <60 mL/min signify possible Chronic Kidney Disease.    Anion gap 14 5 - 15    Ct Abdomen Pelvis Wo Contrast  05/02/2015  CLINICAL DATA:  Left lower quadrant abdominal mass.  Diverticulitis EXAM: CT ABDOMEN AND PELVIS WITHOUT CONTRAST TECHNIQUE: Multidetector CT imaging of the abdomen and pelvis was performed following the standard protocol without IV contrast. COMPARISON:  04/03/2015 FINDINGS: Lower chest and abdominal wall: Marked cardiomegaly with right ventricular pacer leads. Large pericardial effusion, chronic and water density. New small and layering pleural effusions, greater on the right. Basilar atelectasis. Anasarca Hepatobiliary: No focal liver abnormality.Cholecystectomy. No bile duct distention. Pancreas: Unremarkable. Spleen: Unremarkable. Adrenals/Urinary Tract: Negative adrenals. No hydronephrosis or stone. Sub cm -density lesions in the right kidney are likely hemorrhagic cysts. Smaller high-density foci in the left kidney are likely the same. Apparent 18 mm soft tissue density nodule exophytic from the upper pole left kidney is scarring based on 2007 CT . Reproductive:No pathologic findings. Stomach/Bowel: Inflammatory changes at the level of the distal descending and sigmoid colon are improved. This is again favored to reflect sigmoid diverticulitis, but mass lesion cannot be excluded, especially by noncontrast technique. There is new cluster of gas bubbles within the sigmoid colonic mesentery, central to the residual inflammation, with largest bubble measuring 7 mm. No drainable fluid collection. There is progressive stool retention above the sigmoid level. No small bowel distention or obstruction. No appendicitis. Vascular/Lymphatic: Distended venous structures. No  acute vascular abnormality. No mass or adenopathy. Peritoneal: Small ascites. Musculoskeletal: No acute abnormalities. IMPRESSION: 1. Compared to 04/03/2015 sigmoid colon inflammation is improved but there is a new contained perforation into the sigmoid mesentery. No drainable fluid collection. 2. Stool retention above the thickened sigmoid colon. 3. Volume overload. Electronically Signed   By: Monte Fantasia M.D.   On: 05/02/2015 07:41   Dg Chest 2 View  05/01/2015  CLINICAL DATA:  Increasing shortness of breath and fever. EXAM: CHEST  2 VIEW 3:18 p.m. COMPARISON:  Radiographs dated 05/01/2015 at 11:17 a.m. and 02/14/2015 FINDINGS: There is chronic cardiomegaly with new pulmonary vascular congestion with small right and tiny left pleural effusions. The patient has been demonstrate to have the pericardial effusion in the past year on no acute bone abnormality. Pacemaker in place. IMPRESSION: Findings consistent with congestive heart failure. Electronically Signed   By: Lorriane Shire M.D.   On: 05/01/2015 15:26   Dg Abd Acute W/chest  05/01/2015  CLINICAL DATA:  diverticulitis. Pt states that she has not had a fever in the past week but has recently noticed that her temperature rises to 99 in the evenings. Triage temperature: 98.5 F. Pt reports 5-6 small BMs daily. She denies abdominal pain at this time. Pt states that her GI physician is considering a colon resection. Pt had a ptinr done 2 days ago that was 5.74. SOB: Pt reports mild SOB, more than usual. O2 level ranges from 88-92. Triage SpO2: 90. Pt states that she was unable to walk upstairs last night due to SOB. EXAM: DG ABDOMEN ACUTE W/ 1V CHEST COMPARISON:  CT 04/03/2015 and previous studies FINDINGS: Moderate cardiomegaly as before. Stable right subclavian transvenous pacemaker. Chronic blunting of the left laterals costophrenic angle. Central pulmonary vascular congestion. Atheromatous  aorta. No free air. Normal bowel gas pattern. Patchy aortoiliac  arterial calcifications. Surgical clips in the right upper abdomen and right pelvis. Mild degenerative changes in the lumbar spine IMPRESSION: Stable cardiomegaly Negative abdominal radiographs. Electronically Signed   By: Lucrezia Europe M.D.   On: 05/01/2015 12:56    Review of Systems  Constitutional: Positive for fever. Negative for chills, weight loss, malaise/fatigue and diaphoresis.  Respiratory: Negative for cough, hemoptysis, sputum production and wheezing.   Cardiovascular: Positive for orthopnea and leg swelling. Negative for chest pain, palpitations, claudication and PND.  Gastrointestinal: Positive for abdominal pain, constipation and melena. Negative for heartburn, nausea, vomiting, diarrhea and blood in stool.  Genitourinary: Negative for dysuria, urgency, frequency, hematuria and flank pain.  Neurological: Negative for dizziness, tingling, tremors, sensory change, speech change, focal weakness, seizures, loss of consciousness and weakness.   Blood pressure 119/57, pulse 63, temperature 98.6 F (37 C), temperature source Oral, resp. rate 18, height '5\' 5"'$  (1.651 m), weight 74.844 kg (165 lb), SpO2 98 %. Physical Exam  Constitutional: She is oriented to person, place, and time. She appears well-developed and well-nourished. No distress.  Cardiovascular:  s1s2 irregularly irregular.    Respiratory: No respiratory distress. She has no wheezes. She has rales. She exhibits no tenderness.  GI: Soft. Bowel sounds are normal. She exhibits no distension. There is no rebound and no guarding.  Mild ttp llq, palpable mass llq.   Musculoskeletal:  +3 pitting edema BLE   Neurological: She is oriented to person, place, and time.  Skin: Skin is warm and dry. She is not diaphoretic.  Psychiatric: She has a normal mood and affect. Her behavior is normal. Judgment and thought content normal.    Assessment/Plan: Recurrent sigmoid diverticulitis with contained perforation-mild focal tenderness to LLQ,  WBC 11.1k.  There are no indications for surgical intervention at this point.  This is her 3rd admission this year for diverticulitis, but again only minimally tender and given her age, multiple co morbidities, would be hesitant to offer any surgery at this point.  Recommend rocephin/flagyl, she has failed cipro/flagyl, zosyn previously and was on Invanz. Tolerating oral take, can likely continue.  Suspect the llq mass is stool, would not recommend any treatment at this time for constipation.  CV-would hold coumadin should she decline and require surgery.  CHF exacerbation, Afib Dispo-OP f/u with Dr. Dalbert Batman and Dr. Watt Climes for scope  Will follow along   [facility] ANP-BC Pager [phone] 05/02/2015, 1:41 PM

## 2015-05-02 NOTE — Progress Notes (Signed)
Admissions RN was called for admission history. Upon arrival, the patient was asleep. Patient was added to the morning admissions RN list.

## 2015-05-03 DIAGNOSIS — G8929 Other chronic pain: Secondary | ICD-10-CM

## 2015-05-03 DIAGNOSIS — M546 Pain in thoracic spine: Secondary | ICD-10-CM

## 2015-05-03 LAB — CBC
HCT: 31.2 % — ABNORMAL LOW (ref 36.0–46.0)
HCT: 30.1 % — ABNORMAL LOW (ref 36.0–46.0)
Hemoglobin: 9.5 g/dL — ABNORMAL LOW (ref 12.0–15.0)
Hemoglobin: 9.6 g/dL — ABNORMAL LOW (ref 12.0–15.0)
MCH: 26.3 pg (ref 26.0–34.0)
MCH: 27.2 pg (ref 26.0–34.0)
MCHC: 30.4 g/dL (ref 30.0–36.0)
MCHC: 31.9 g/dL (ref 30.0–36.0)
MCV: 86.4 fL (ref 78.0–100.0)
MCV: 85.3 fL (ref 78.0–100.0)
PLATELETS: 539 10*3/uL — AB (ref 150–400)
Platelets: 519 10*3/uL — ABNORMAL HIGH (ref 150–400)
RBC: 3.61 MIL/uL — ABNORMAL LOW (ref 3.87–5.11)
RBC: 3.53 MIL/uL — AB (ref 3.87–5.11)
RDW: 14.4 % (ref 11.5–15.5)
RDW: 14.6 % (ref 11.5–15.5)
WBC: 11.1 10*3/uL — AB (ref 4.0–10.5)
WBC: 10.3 10*3/uL (ref 4.0–10.5)

## 2015-05-03 LAB — BASIC METABOLIC PANEL
Anion gap: 13 (ref 5–15)
BUN: 30 mg/dL — AB (ref 6–20)
CALCIUM: 8.6 mg/dL — AB (ref 8.9–10.3)
CO2: 29 mmol/L (ref 22–32)
CREATININE: 1.46 mg/dL — AB (ref 0.44–1.00)
Chloride: 92 mmol/L — ABNORMAL LOW (ref 101–111)
GFR calc Af Amer: 35 mL/min — ABNORMAL LOW (ref >60)
GFR, EST NON AFRICAN AMERICAN: 30 mL/min — AB (ref >60)
GLUCOSE: 117 mg/dL — AB (ref 65–99)
POTASSIUM: 4.3 mmol/L (ref 3.5–5.1)
Sodium: 134 mmol/L — ABNORMAL LOW (ref 135–145)

## 2015-05-03 LAB — PROTIME-INR
INR: 2.49 — AB (ref 0.00–1.49)
Prothrombin Time: 26.6 seconds — ABNORMAL HIGH (ref 11.6–15.2)

## 2015-05-03 MED ORDER — WARFARIN SODIUM 2.5 MG PO TABS
2.5000 mg | ORAL_TABLET | Freq: Once | ORAL | Status: AC
  Administered 2015-05-03: 2.5 mg via ORAL
  Filled 2015-05-03: qty 1

## 2015-05-03 MED ORDER — TRAMADOL HCL 50 MG PO TABS
50.0000 mg | ORAL_TABLET | Freq: Once | ORAL | Status: AC
  Administered 2015-05-03: 50 mg via ORAL
  Filled 2015-05-03: qty 1

## 2015-05-03 MED ORDER — OXYCODONE HCL 5 MG PO TABS
2.5000 mg | ORAL_TABLET | ORAL | Status: DC | PRN
  Administered 2015-05-04 – 2015-05-08 (×9): 2.5 mg via ORAL
  Filled 2015-05-03 (×10): qty 1

## 2015-05-03 MED ORDER — METOLAZONE 5 MG PO TABS
5.0000 mg | ORAL_TABLET | Freq: Every day | ORAL | Status: DC
  Administered 2015-05-03 – 2015-05-06 (×4): 5 mg via ORAL
  Filled 2015-05-03 (×4): qty 1

## 2015-05-03 MED ORDER — FUROSEMIDE 10 MG/ML IJ SOLN
80.0000 mg | Freq: Three times a day (TID) | INTRAMUSCULAR | Status: DC
  Administered 2015-05-03 – 2015-05-07 (×11): 80 mg via INTRAVENOUS
  Filled 2015-05-03 (×11): qty 8

## 2015-05-03 MED ORDER — HYDROCODONE-ACETAMINOPHEN 5-325 MG PO TABS: 1.0000 | ORAL_TABLET | Freq: Once | ORAL | Status: DC

## 2015-05-03 NOTE — Progress Notes (Addendum)
[place] for warfarin Indication: atrial fibrillation  Allergies  Allergen Reactions  . Iodinated Diagnostic Agents Hives    HIVES 15MIN S/P IV CONTRAST INJECTION,WILL NEED 13 HR PREP FOR FUTURE INJECTIONS, ok s/p 50mg  po benadryl//a.calhoun  . Anti-Inflammatory Enzyme [Nutritional Supplements]     Retains fluids and headaches  . Arthrotec [Diclofenac-Misoprostol] Other (See Comments)    unknown  . Biaxin [Clarithromycin] Other (See Comments)    unknown  . Plavix [Clopidogrel Bisulfate] Other (See Comments)    unknown  . Spironolactone     Hair loss    Patient Measurements: Height: 5\' 5"  (165.1 cm) Weight: 167 lb 12.3 oz (76.1 kg) (pt refused to use standing scale due to pain.) IBW/kg (Calculated) : 57  Vital Signs: Temp: 97.3 F (36.3 C) (04/07 0548) Temp Source: Oral (04/07 0548) BP: 123/46 mmHg (04/07 0548) Pulse Rate: 61 (04/07 0548)  Labs:  Recent Labs  05/01/15 1545 05/01/15 1601 05/01/15 2053 05/02/15 0134 05/02/15 0725 05/03/15 0431 05/03/15 0727  HGB 10.4* 12.2  --  9.5*  --  9.6*  --   HCT 33.6* 36.0  --  31.2*  --  30.1*  --   PLT 484*  --   --  519*  --  539*  --   LABPROT 27.6*  --   --  26.3*  --  26.6*  --   INR 2.61*  --   --  2.45*  --  2.49*  --   CREATININE 1.42* 1.40*  --   --  1.42*  --  1.46*  TROPONINI  --   --  0.03 0.03 0.04*  --   --     Estimated Creatinine Clearance: 26.1 mL/min (by C-G formula based on Cr of 1.46).   Medications:  Scheduled:  . Barium Sulfate  900 mL Oral UD  . cefTRIAXone (ROCEPHIN)  IV  2 g Intravenous Q24H  . digoxin  0.0625 mg Oral Once per day on Mon Tue Thu Fri  . furosemide  40 mg Intravenous Q6H  . gabapentin  100 mg Oral TID  . metronidazole  500 mg Intravenous Q8H  . sodium chloride flush  3 mL Intravenous Q12H  . verapamil  180 mg Oral QHS  . Warfarin - Pharmacist Dosing Inpatient   Does not apply q1800    Assessment: 82 yo F on warfarin PTA for hx of  afib.  Hgb relatively stable 10.4 > 9.5.  Per MD notes, was previously having blood in stools thought due to hemorrhoids. INR 2.49.  Flagyl was started yesterday, watch INR for potential fluctuations.  PTA warfarin: warfarin 5 mg q Sun/Tues/Thurs, 2.5 mg all other days   Goal of Therapy:  INR 2-3 Monitor platelets by anticoagulation protocol: Yes    Plan:  Warfarin 2.5mg  po x 1 Daily INR    Hughes Better, [name], BCPS Clinical Pharmacist 05/03/2015 9:34 AM

## 2015-05-03 NOTE — Progress Notes (Signed)
Family Medicine Teaching Service Daily Progress Note Intern Pager: [phone]  Patient name: [name] Medical record number: [id number] Date of birth: 1923/11/26 Age: 82 y.o. Gender: female  Primary Care Provider: [name], MD Consultants: card Code Status: full  Pt Overview and Major Events to Date:  04/05-admitted with CHF exacerbation.  Assessment and Plan: [name] is a 82 y.o. female presenting with shortness of breath and fever. PMH is significant for atrial fibrillation, hx of CVA, hypertension, type 2 diabetes, tachycardia-bradycardia syndrome s/p pacemaker, prior pericardial effusion s/p pericardial window 12/16, sleep apnea (no CPAP), stroke (right frontal 2013), GI bleeding with hemorrhoids, and arthritis and recurrent diverticulitis.  Dyspnea: improved. Likely 2/2 CHF exacerbation. ECHO in 01/2015 with EF 0000000, no diastolic dysfunction. Mod RAE, LAE and PAPP of 38.Dry weight about 72 kg. Continues to have significant bilateral edema to hip, JVD and crackles. UOP only 250 cc charted in epic. Patient don't think lasix is working for her either. Weight up over the last 24 hrs. -Continue IV lasix 40 mg three times a day. Cr 1.46 this am -Add metolazone 5 mg daily -Strict I&O and daily weight -BMP daily -hold home toresamide -Card consulted over the phone: not impressed with EKG given ventricular pacing in Afib -Consider repeat Echo -wean oxygen as needed. -PT eval  AKI: Cr 1.4>1.42>1.46. BUN 30. Cr 1.0 on discharge on 03/16. Likely secondary to Torsemide. Could also be secondary to CHF (cardiorenal syndrome) -Will monitor BMP   Recurrent diverticulitis: fever at home. Afebrile here. Palpable mass over LLQ. CT abdomen 04/06 with new contained perforation into the sigmoid mesentery. No drainable fluid collection. Stool retention above the thickened sigmoid colon.  -Appreciate surg recs:  -no indications for surgical intervention at this point.  -CTX and  flagyl (4/6>). Poor response to Cipro and flagyl previous admission.  -OP f/u with Dr. Dalbert Batman and Dr. Watt Climes for scope  UTI: no dysuria but urgency. She is on diuretics as well. UA with many bacteria, few LE and positive for nitrite. Urine culture pending. Left sided CVA but also history of chronic back pain. Afebrile her. -CTX as above -f/u urine culture  Atrial fibrillation:  Digoxin level 0.8, normal today. INR therapeutic -Continue Verapamil and Digoxin  -Continue warfarin per pharmacy -Daily PT/INR  Anemia: stable. hgb 9.6, (baseline 11). MCV 84% argues against IDA. History of diverticulitis and hemorrhoids - monitor for GI bleed  Back pain: history of chronic back pain, She says she was on Tylenol #3 as needed at home. Not listed on her home meds. Not responding to plain tylenol or tramadol -Oxycodone 2.5 mg q4h as needed pain  FEN/GI: -heart healthy -saline lock  Prophylaxis: on warfarin for Afib  Disposition: regular floor with telemetry. Will continue diuresing.   Subjective:  Reports her breathing is better. Denies chest pain. Eating and drinking well. Reports back pain that she describes as spasm over thoracic region. Taking tylenol #3 at home. Says plain tylenol and tramadol hasn't worked for her.   Objective: Temp:  [97.3 F (36.3 C)-98.6 F (37 C)] 97.3 F (36.3 C) (04/07 0548) Pulse Rate:  [61-63] 61 (04/07 0548) Resp:  [18-20] 20 (04/07 0548) BP: (119-123)/(46-57) 123/46 mmHg (04/07 0548) SpO2:  [92 %-98 %] 92 % (04/07 0548) Weight:  [167 lb 12.3 oz (76.1 kg)] 167 lb 12.3 oz (76.1 kg) (04/07 0548)  Physical Exam: Gen: appears well, sitting on bed eating breakfast, pleasant Neck: supple, no LAD, JVD+, no hepatojugular reflux CV: regular rate  and rythm. S1 & S2 audible, 2/6 murmurs over right and left sternal border, 2/3+ pitting edema bilaterally up to the knees, JVD+, no hepatojugular reflux Resp: on 2L by [place], no apparent work of breathing, mild crackles  over lower lung fields.  GI: bowel sounds normal, mild tenderness and tennis ball size mass in the LLQ.   GU: no suprapubic tenderness Skin: no lesions Neuro: alert oriented without gross deficit  Laboratory:  Recent Labs Lab 05/01/15 1545 05/01/15 1601 05/02/15 0134 05/03/15 0431  WBC 13.2*  --  11.1* 10.3  HGB 10.4* 12.2 9.5* 9.6*  HCT 33.6* 36.0 31.2* 30.1*  PLT 484*  --  519* 539*    Recent Labs Lab 05/01/15 1237 05/01/15 1545 05/01/15 1601 05/02/15 0725  NA 139 138 138 137  K 5.2 4.3 4.1 4.4  CL 98 96* 94* 93*  CO2 32* 30  --  30  BUN 28* 24* 30* 26*  CREATININE 1.51* 1.42* 1.40* 1.42*  CALCIUM 9.1 9.1  --  8.8*  PROT 6.7  --   --   --   BILITOT 0.7  --   --   --   ALKPHOS 118  --   --   --   ALT 22  --   --   --   AST 37*  --   --   --   GLUCOSE 109* 97 96 120*    Imaging/Diagnostic Tests: No results found.  Mercy Riding, MD 05/03/2015, 7:10 AM PGY-1, [place] Intern pager: [phone], text pages welcome

## 2015-05-03 NOTE — Evaluation (Signed)
Physical Therapy Evaluation Patient Details Name: [name] MRN: [id number] DOB: Jun 16, 1923 Today's Date: 05/03/2015   History of Present Illness  82 y.o. female presenting with shortness of breath and fever. PMH is significant for atrial fibrillation, hx of CVA, hypertension, type 2 diabetes, tachycardia-bradycardia syndrome s/p pacemaker, prior pericardial effusion s/p pericardial window 12/16, sleep apnea (no CPAP), stroke (right frontal 2013), GI bleeding with hemorrhoids, and arthritis and recurrent diverticulitis.  Clinical Impression  Patient demonstrates deficits in functional mobility as indicated below. Will benefit from continued skilled PT to address deficits and maximize function. Will see as indicated and progress as tolerated. Ambulated patient with use of assistive device for stability today (noted patient typically independent without RW at home). Patient ambulated on room air with saturations stable >92%.     Follow Up Recommendations No PT follow up    Equipment Recommendations  None recommended by PT    Recommendations for Other Services       Precautions / Restrictions Precautions Precautions: None Restrictions Weight Bearing Restrictions: No      Mobility  Bed Mobility Overal bed mobility: Modified Independent                Transfers Overall transfer level: Modified independent Equipment used: Rolling walker (2 wheeled);None                Ambulation/Gait Ambulation/Gait assistance: Supervision (min guard 79ft without device, some instability noted) Ambulation Distance (Feet): 180 Feet Assistive device: Rolling walker (2 wheeled) Gait Pattern/deviations: Trunk flexed;Narrow base of support;Step-through pattern     General Gait Details: Steady with ambulation, ocassional drift but able to correct without cues  Stairs            Wheelchair Mobility    Modified Rankin (Stroke Patients Only)       Balance     Sitting  balance-Leahy Scale: Good     Standing balance support: No upper extremity supported Standing balance-Leahy Scale: Fair                               Pertinent Vitals/Pain Pain Assessment: No/denies pain    Home Living Family/patient expects to be discharged to:: Private residence Living Arrangements: Alone Available Help at Discharge: Family;Available PRN/intermittently Type of Home: House Home Access: Stairs to enter Entrance Stairs-Rails: Left Entrance Stairs-Number of Steps: 2 Home Layout: Two level Home Equipment: Environmental consultant - 2 wheels Additional Comments: Pt plans to stay at her daughters hourse upon DC    Prior Function Level of Independence: Independent               Hand Dominance   Dominant Hand: Right    Extremity/Trunk Assessment   Upper Extremity Assessment: Overall WFL for tasks assessed           Lower Extremity Assessment: Overall WFL for tasks assessed         Communication   Communication: HOH  Cognition Arousal/Alertness: Awake/alert Behavior During Therapy: WFL for tasks assessed/performed Overall Cognitive Status: Within Functional Limits for tasks assessed                      General Comments      Exercises        Assessment/Plan    PT Assessment Patient needs continued PT services  PT Diagnosis Difficulty walking   PT Problem List Decreased mobility  PT Treatment Interventions Gait training;Stair training;Functional mobility training;Therapeutic activities;Therapeutic  exercise;Patient/family education   PT Goals (Current goals can be found in the Care Plan section) Acute Rehab PT Goals Patient Stated Goal: Return to her home PT Goal Formulation: With patient Time For Goal Achievement: 05/17/15 Potential to Achieve Goals: Good    Frequency Min 3X/week   Barriers to discharge        Co-evaluation               End of Session Equipment Utilized During Treatment: Gait belt Activity  Tolerance: Patient tolerated treatment well Patient left: in chair;with call bell/phone within reach;with family/visitor present;with chair alarm set Nurse Communication: Mobility status;Other (comment)         Time: [id number] PT Time Calculation (min) (ACUTE ONLY): 17 min   Charges:   PT Evaluation $PT Eval Moderate Complexity: 1 Procedure     PT G CodesDuncan Dull 05-07-2015, 3:05 PM Alben Deeds, [place] DPT  [phone]

## 2015-05-03 NOTE — Progress Notes (Signed)
  Subjective: 3 small BMs, passing gas, abdominal pain has almost completely resolved.  Objective: Vital signs in last 24 hours: Temp:  [97.3 F (36.3 C)-98.6 F (37 C)] 97.9 F (36.6 C) (04/07 1000) Pulse Rate:  [59-63] 59 (04/07 1000) Resp:  [18-20] 20 (04/07 1000) BP: (117-123)/(46-59) 117/59 mmHg (04/07 1000) SpO2:  [92 %-98 %] 93 % (04/07 1000) Weight:  [76.1 kg (167 lb 12.3 oz)] 76.1 kg (167 lb 12.3 oz) (04/07 0548) Last BM Date: 05/01/15  Intake/Output from previous day: 04/06 0701 - 04/07 0700 In: 1210 [P.O.:1060; IV Piggyback:150] Out: 250 [Urine:250] Intake/Output this shift: Total I/O In: 340 [P.O.:240; IV Piggyback:100] Out: -   General appearance: alert and cooperative Resp: rales bilaterally GI: soft, minimal tenderness LLQ to deep palp, no generalized tenderness  Lab Results:   Recent Labs  05/02/15 0134 05/03/15 0431  WBC 11.1* 10.3  HGB 9.5* 9.6*  HCT 31.2* 30.1*  PLT 519* 539*   BMET  Recent Labs  05/02/15 0725 05/03/15 0727  NA 137 134*  K 4.4 4.3  CL 93* 92*  CO2 30 29  GLUCOSE 120* 117*  BUN 26* 30*  CREATININE 1.42* 1.46*  CALCIUM 8.8* 8.6*   PT/INR  Recent Labs  05/02/15 0134 05/03/15 0431  LABPROT 26.3* 26.6*  INR 2.45* 2.49*   Anti-infectives: Anti-infectives    Start     Dose/Rate Route Frequency Ordered Stop   05/02/15 1300  cefTRIAXone (ROCEPHIN) 2 g in dextrose 5 % 50 mL IVPB     2 g 100 mL/hr over 30 Minutes Intravenous Every 24 hours 05/02/15 1222     05/02/15 1300  metroNIDAZOLE (FLAGYL) IVPB 500 mg     500 mg 100 mL/hr over 60 Minutes Intravenous Every 8 hours 05/02/15 1222        Assessment/Plan: Sigmoid diverticulitis with contained perforation into mesentery - exam improving, continue Rocephin and Flagyl. We will follow.  LOS: 2 days    Mazie Fencl E 05/03/2015

## 2015-05-03 NOTE — Progress Notes (Signed)
C diff PCR was negative on 04/29/15 during MD office visit. MD paged and informed. Enteric precautions d/c'ed. Will continue to monitor.

## 2015-05-03 NOTE — Progress Notes (Addendum)
After speaking with day shift charge RN who previously spoke with Infection Prevention, pt is to remain on Enteric precautions for 30 days after the last C diff positive result. This RN was unaware of positive result from 04/08/15 as it did not appear under microbiology results review. This ends on 05/09/15. Will pass on to day shift RN and continue to monitor.

## 2015-05-03 NOTE — Telephone Encounter (Signed)
Patient currently hospitalized (since 4-5) for 3 episode of diverticulitis this year.  Will contact her after discharge to determine if sigmoidoscopy still on schedule

## 2015-05-04 LAB — BASIC METABOLIC PANEL
ANION GAP: 15 (ref 5–15)
BUN: 32 mg/dL — ABNORMAL HIGH (ref 6–20)
CHLORIDE: 92 mmol/L — AB (ref 101–111)
CO2: 29 mmol/L (ref 22–32)
Calcium: 8.7 mg/dL — ABNORMAL LOW (ref 8.9–10.3)
Creatinine, Ser: 1.4 mg/dL — ABNORMAL HIGH (ref 0.44–1.00)
GFR calc non Af Amer: 32 mL/min — ABNORMAL LOW (ref >60)
GFR, EST AFRICAN AMERICAN: 37 mL/min — AB (ref >60)
GLUCOSE: 95 mg/dL (ref 65–99)
POTASSIUM: 3.5 mmol/L (ref 3.5–5.1)
Sodium: 136 mmol/L (ref 135–145)

## 2015-05-04 LAB — CBC
HEMATOCRIT: 29.2 % — AB (ref 36.0–46.0)
HEMOGLOBIN: 9.3 g/dL — AB (ref 12.0–15.0)
MCH: 27.2 pg (ref 26.0–34.0)
MCHC: 31.8 g/dL (ref 30.0–36.0)
MCV: 85.4 fL (ref 78.0–100.0)
Platelets: 465 10*3/uL — ABNORMAL HIGH (ref 150–400)
RBC: 3.42 MIL/uL — ABNORMAL LOW (ref 3.87–5.11)
RDW: 14.5 % (ref 11.5–15.5)
WBC: 9.7 10*3/uL (ref 4.0–10.5)

## 2015-05-04 LAB — URINE CULTURE

## 2015-05-04 LAB — PROTIME-INR
INR: 2.48 — AB (ref 0.00–1.49)
Prothrombin Time: 26.6 seconds — ABNORMAL HIGH (ref 11.6–15.2)

## 2015-05-04 MED ORDER — WARFARIN SODIUM 2.5 MG PO TABS
2.5000 mg | ORAL_TABLET | Freq: Once | ORAL | Status: AC
  Administered 2015-05-04: 2.5 mg via ORAL
  Filled 2015-05-04: qty 1

## 2015-05-04 MED ORDER — POTASSIUM CHLORIDE CRYS ER 20 MEQ PO TBCR
40.0000 meq | EXTENDED_RELEASE_TABLET | Freq: Every day | ORAL | Status: DC
  Administered 2015-05-04: 40 meq via ORAL
  Filled 2015-05-04: qty 2

## 2015-05-04 NOTE — Progress Notes (Signed)
  Subjective: No abdominal pain, eating  Objective: Vital signs in last 24 hours: Temp:  [97.7 F (36.5 C)-98.1 F (36.7 C)] 97.8 F (36.6 C) (04/08 0435) Pulse Rate:  [51-78] 78 (04/08 0435) Resp:  [20-22] 22 (04/08 0435) BP: (113-123)/(41-67) 118/51 mmHg (04/08 0435) SpO2:  [92 %-99 %] 92 % (04/08 0435) Weight:  [74.526 kg (164 lb 4.8 oz)] 74.526 kg (164 lb 4.8 oz) (04/08 0435) Last BM Date: 05/03/15  Intake/Output from previous day: 04/07 0701 - 04/08 0700 In: 1040 [P.O.:940; IV Piggyback:100] Out: 2150 [Urine:2150] Intake/Output this shift:    General appearance: cooperative GI: soft, nontender  Lab Results:   Recent Labs  05/03/15 0431 05/04/15 0443  WBC 10.3 9.7  HGB 9.6* 9.3*  HCT 30.1* 29.2*  PLT 539* 465*   BMET  Recent Labs  05/03/15 0727 05/04/15 0443  NA 134* 136  K 4.3 3.5  CL 92* 92*  CO2 29 29  GLUCOSE 117* 95  BUN 30* 32*  CREATININE 1.46* 1.40*  CALCIUM 8.6* 8.7*   PT/INR  Recent Labs  05/03/15 0431 05/04/15 0443  LABPROT 26.6* 26.6*  INR 2.49* 2.48*   ABG No results for input(s): PHART, HCO3 in the last 72 hours.  Invalid input(s): PCO2, PO2  Studies/Results: No results found.  Anti-infectives: Anti-infectives    Start     Dose/Rate Route Frequency Ordered Stop   05/02/15 1300  cefTRIAXone (ROCEPHIN) 2 g in dextrose 5 % 50 mL IVPB     2 g 100 mL/hr over 30 Minutes Intravenous Every 24 hours 05/02/15 1222     05/02/15 1300  metroNIDAZOLE (FLAGYL) IVPB 500 mg     500 mg 100 mL/hr over 60 Minutes Intravenous Every 8 hours 05/02/15 1222        Assessment/Plan: Sigmoid diverticulitis with contained perforation into mesentery - exam improving, continue Rocephin and Flagyl today. Will cahnge to PO ABX tomorrow if continues to improve CHF - per primary  LOS: 3 days    Winter Trefz E 05/04/2015

## 2015-05-04 NOTE — Progress Notes (Signed)
ANTICOAGULATION CONSULT NOTE - Follow Up Consult  Pharmacy Consult for warfarin Indication: atrial fibrillation  Allergies  Allergen Reactions  . Iodinated Diagnostic Agents Hives    HIVES 15MIN S/P IV CONTRAST INJECTION,WILL NEED 13 HR PREP FOR FUTURE INJECTIONS, ok s/p 50mg  po benadryl//a.calhoun  . Anti-Inflammatory Enzyme [Nutritional Supplements]     Retains fluids and headaches  . Arthrotec [Diclofenac-Misoprostol] Other (See Comments)    unknown  . Biaxin [Clarithromycin] Other (See Comments)    unknown  . Plavix [Clopidogrel Bisulfate] Other (See Comments)    unknown  . Spironolactone     Hair loss    Patient Measurements: Height: 5\' 5"  (165.1 cm) Weight: 164 lb 4.8 oz (74.526 kg) (scale c) IBW/kg (Calculated) : 57  Vital Signs: Temp: 98.1 F (36.7 C) (04/08 1219) Temp Source: Oral (04/08 1219) BP: 122/48 mmHg (04/08 1219) Pulse Rate: 66 (04/08 1219)  Labs:  Recent Labs  05/01/15 2053 05/02/15 0134 05/02/15 0725 05/03/15 0431 05/03/15 0727 05/04/15 0443  HGB  --  9.5*  --  9.6*  --  9.3*  HCT  --  31.2*  --  30.1*  --  29.2*  PLT  --  519*  --  539*  --  465*  LABPROT  --  26.3*  --  26.6*  --  26.6*  INR  --  2.45*  --  2.49*  --  2.48*  CREATININE  --   --  1.42*  --  1.46* 1.40*  TROPONINI 0.03 0.03 0.04*  --   --   --     Estimated Creatinine Clearance: 27 mL/min (by C-G formula based on Cr of 1.4).   Medications:  Prescriptions prior to admission  Medication Sig Dispense Refill Last Dose  . Digoxin 62.5 MCG TABS Take 0.0625 mg by mouth daily. Take only 5 days a week; skip Wednesdays and Sundays 30 tablet 6 05/01/2015 at Unknown time  . gabapentin (NEURONTIN) 100 MG capsule Take 1 capsule (100 mg total) by mouth 3 (three) times daily. 90 capsule 3 05/01/2015 at Unknown time  . potassium chloride (K-DUR,KLOR-CON) 10 MEQ tablet Take 2 tablets (20 mEq total) by mouth 2 (two) times daily. Take with Torsemide 120 tablet 0 05/01/2015 at Unknown time  .  torsemide (DEMADEX) 20 MG tablet Take 3 tablets (60 mg total) by mouth daily. 180 tablet 6 05/01/2015 at Unknown time  . verapamil (CALAN-SR) 180 MG CR tablet take 1 tablet by mouth at bedtime 30 tablet 11 04/30/2015 at Unknown time  . warfarin (COUMADIN) 5 MG tablet Take 2.5-5 mg by mouth as directed. Takes 0.5 tablet (2.5 mg total) by mouth Monday, Wednesday, Friday, Saturday. Take 1 tablet (5 mg total) by mouth Sunday, Tuesday, Thursday.   Past Week at Unknown time  . hydrocortisone-pramoxine (ANALPRAM-HC) 2.5-1 % rectal cream Place 1 application rectally 3 (three) times daily. Reported on 05/01/2015   Not Taking  . ondansetron (ZOFRAN ODT) 4 MG disintegrating tablet Take 1 tablet (4 mg total) by mouth every 8 (eight) hours as needed for nausea or vomiting. (Patient not taking: Reported on 05/01/2015) 20 tablet 0 Not Taking at Unknown time    Assessment: 87 yoF admitted 05/01/2015 on warfarin PTA for Afib. She does have a history of blood in stool likely thought due to hemorrhoids.  PTA warfarin: warfarin 5 mg q Sun/Tues/Thurs, 2.5 mg all other days   INR 2.48. H/H stable, Plt 465. No further s/sx of bleeding noted.  Goal of Therapy:  INR 2-3 Monitor  platelets by anticoagulation protocol: Yes   Plan:  - Warfarin 2.5 mg x1 tonight - Monitor daily INR, CBC and s/sx of bleeding  Dimitri Ped, [name]. PGY-1 Pharmacy Resident Pager: [phone] 05/04/2015,2:56 PM

## 2015-05-04 NOTE — Progress Notes (Signed)
Family Medicine Teaching Service Daily Progress Note Intern Pager: [phone]  Patient name: [name] Medical record number: [id number] Date of birth: Jun 24, 1923 Age: 82 y.o. Gender: female  Primary Care Provider: [name], MD Consultants: Surgery  Code Status: full  Pt Overview and Major Events to Date:  04/05-admitted with CHF exacerbation.  ABX/Culture CTX 4/6>> Flagyl 4/6>>  Urine culture 4/6>>reincubated for better growth  Assessment and Plan: [name] is a 82 y.o. female presenting with shortness of breath and fever. PMH is significant for atrial fibrillation, hx of CVA, hypertension, type 2 diabetes, tachycardia-bradycardia syndrome s/p pacemaker, prior pericardial effusion s/p pericardial window 12/16, sleep apnea (no CPAP), stroke (right frontal 2013), GI bleeding with hemorrhoids, and arthritis and recurrent diverticulitis.  #Dyspnea: improving. Likely 2/2 CHF exacerbation. ECHO in 01/2015 with EF 0000000, no diastolic dysfunction. Mod RAE, LAE and PAPP of 38.Dry weight about 72 kg. - I/O: 1040/2150; Net -835 - Continue IV lasix 80 mg Q8 - Add metolazone 5 mg daily - hold home toresamide - Card consulted over the phone: not impressed with EKG given ventricular pacing in Afib - PT: no follow up  - advised to bed out of bed today.   #Recurrent diverticulitis:  Improving. Afebrile here. Palpable mass over LLQ. CT abdomen 04/06 with new contained perforation into the sigmoid mesentery.  -Appreciate surg recs: Plan to change to PO ABX tomorrow. Continue CTX and flagyl. Can consider Augmentin when changing to PO. Poor response to Cipro and flagyl previous admission. -OP f/u with Dr. Dalbert Batman and Dr. Watt Climes for scope  #AKI: resolving.  Cr 1.0 on discharge on 03/16. Likely secondary to Torsemide. Could also be secondary to CHF (cardiorenal syndrome) - BMP   #UTI: no dysuria but urgency. She is on diuretics as well. UA with many bacteria, few LE and positive for  nitrite. Marland Kitchen -CTX as above  #Atrial fibrillation:  Digoxin level 0.8, normal today. INR therapeutic - Continue Verapamil and Digoxin  - Continue warfarin per pharmacy - Daily PT/INR  #Anemia: stable. hgb 9.6, (baseline 11). MCV 84% argues against IDA. History of diverticulitis and hemorrhoids - monitor for GI bleed  #Back pain: history of chronic back pain, She says she was on Tylenol #3 as needed at home. Not listed on her home meds. Not responding to plain tylenol or tramadol -Oxycodone 2.5 mg q4h as needed pain  FEN/GI: heart healthy/saline lock Prophylaxis: on warfarin for Afib  Disposition: pending diuresing.   Subjective:  Lying in bed. Denies any SOB. Getting up to use the restroom a lot.   Objective: Temp:  [97.7 F (36.5 C)-98.1 F (36.7 C)] 97.8 F (36.6 C) (04/08 0435) Pulse Rate:  [51-78] 78 (04/08 0435) Resp:  [20-22] 22 (04/08 0435) BP: (113-123)/(41-67) 118/51 mmHg (04/08 0435) SpO2:  [92 %-99 %] 92 % (04/08 0435) Weight:  [164 lb 4.8 oz (74.526 kg)] 164 lb 4.8 oz (74.526 kg) (04/08 0435)  Physical Exam: Gen: appears well, pleasant Neck: supple, no LAD,  CV: regular rate and rythm. S1 & S2 audible, 2/6 murmurs over right and left sternal border, 2/3+ pitting edema bilaterally up to the knees, JVD+, Resp: on 2L by , no apparent work of breathing, no crackles .  GI: bowel sounds normal, no tenderness .   GU: no suprapubic tenderness Skin: no lesions Neuro: alert oriented without gross deficit  Laboratory:  Recent Labs Lab 05/02/15 0134 05/03/15 0431 05/04/15 0443  WBC 11.1* 10.3 9.7  HGB 9.5* 9.6* 9.3*  HCT  31.2* 30.1* 29.2*  PLT 519* 539* 465*    Recent Labs Lab 05/01/15 1237  05/02/15 0725 05/03/15 0727 05/04/15 0443  NA 139  < > 137 134* 136  K 5.2  < > 4.4 4.3 3.5  CL 98  < > 93* 92* 92*  CO2 32*  < > 30 29 29   BUN 28*  < > 26* 30* 32*  CREATININE 1.51*  < > 1.42* 1.46* 1.40*  CALCIUM 9.1  < > 8.8* 8.6* 8.7*  PROT 6.7  --   --    --   --   BILITOT 0.7  --   --   --   --   ALKPHOS 118  --   --   --   --   ALT 22  --   --   --   --   AST 37*  --   --   --   --   GLUCOSE 109*  < > 120* 117* 95  < > = values in this interval not displayed.  Imaging/Diagnostic Tests: No results found.  Rosemarie Ax, MD 05/04/2015, 8:39 AM PGY-3, [place] Intern pager: [phone], text pages welcome

## 2015-05-05 LAB — BASIC METABOLIC PANEL
ANION GAP: 14 (ref 5–15)
ANION GAP: 13 (ref 5–15)
BUN: 28 mg/dL — ABNORMAL HIGH (ref 6–20)
BUN: 30 mg/dL — ABNORMAL HIGH (ref 6–20)
CALCIUM: 8.6 mg/dL — AB (ref 8.9–10.3)
CALCIUM: 9.1 mg/dL (ref 8.9–10.3)
CHLORIDE: 89 mmol/L — AB (ref 101–111)
CHLORIDE: 87 mmol/L — AB (ref 101–111)
CO2: 35 mmol/L — AB (ref 22–32)
CO2: 36 mmol/L — AB (ref 22–32)
Creatinine, Ser: 1.24 mg/dL — ABNORMAL HIGH (ref 0.44–1.00)
Creatinine, Ser: 1.22 mg/dL — ABNORMAL HIGH (ref 0.44–1.00)
GFR calc Af Amer: 43 mL/min — ABNORMAL LOW (ref >60)
GFR calc non Af Amer: 37 mL/min — ABNORMAL LOW (ref >60)
GFR calc non Af Amer: 38 mL/min — ABNORMAL LOW (ref >60)
GFR, EST AFRICAN AMERICAN: 44 mL/min — AB (ref >60)
GLUCOSE: 100 mg/dL — AB (ref 65–99)
GLUCOSE: 124 mg/dL — AB (ref 65–99)
POTASSIUM: 2.9 mmol/L — AB (ref 3.5–5.1)
Potassium: 2.3 mmol/L — CL (ref 3.5–5.1)
Sodium: 138 mmol/L (ref 135–145)
Sodium: 136 mmol/L (ref 135–145)

## 2015-05-05 LAB — CBC
HEMATOCRIT: 28.6 % — AB (ref 36.0–46.0)
HEMOGLOBIN: 8.8 g/dL — AB (ref 12.0–15.0)
MCH: 26.1 pg (ref 26.0–34.0)
MCHC: 30.8 g/dL (ref 30.0–36.0)
MCV: 84.9 fL (ref 78.0–100.0)
Platelets: 479 10*3/uL — ABNORMAL HIGH (ref 150–400)
RBC: 3.37 MIL/uL — ABNORMAL LOW (ref 3.87–5.11)
RDW: 14.1 % (ref 11.5–15.5)
WBC: 8.6 10*3/uL (ref 4.0–10.5)

## 2015-05-05 LAB — PROTIME-INR
INR: 2.27 — ABNORMAL HIGH (ref 0.00–1.49)
Prothrombin Time: 24.8 seconds — ABNORMAL HIGH (ref 11.6–15.2)

## 2015-05-05 LAB — MAGNESIUM: Magnesium: 1.9 mg/dL (ref 1.7–2.4)

## 2015-05-05 MED ORDER — POTASSIUM CHLORIDE CRYS ER 20 MEQ PO TBCR
40.0000 meq | EXTENDED_RELEASE_TABLET | Freq: Every day | ORAL | Status: AC
  Administered 2015-05-05 – 2015-05-06 (×6): 40 meq via ORAL
  Filled 2015-05-05 (×6): qty 2

## 2015-05-05 MED ORDER — POTASSIUM CHLORIDE 10 MEQ/100ML IV SOLN
10.0000 meq | INTRAVENOUS | Status: AC
  Administered 2015-05-05 (×2): 10 meq via INTRAVENOUS
  Filled 2015-05-05 (×2): qty 100

## 2015-05-05 MED ORDER — WARFARIN SODIUM 3 MG PO TABS
3.0000 mg | ORAL_TABLET | Freq: Once | ORAL | Status: AC
  Administered 2015-05-05: 3 mg via ORAL
  Filled 2015-05-05: qty 1

## 2015-05-05 NOTE — Progress Notes (Addendum)
  Subjective: better  Objective: Vital signs in last 24 hours: Temp:  [98 F (36.7 C)-98.2 F (36.8 C)] 98.2 F (36.8 C) (04/09 0414) Pulse Rate:  [52-68] 52 (04/09 0414) Resp:  [16-18] 16 (04/09 0414) BP: (122-128)/(48-56) 124/48 mmHg (04/09 0414) SpO2:  [90 %-93 %] 93 % (04/09 0414) Weight:  [70.806 kg (156 lb 1.6 oz)] 70.806 kg (156 lb 1.6 oz) (04/09 0414) Last BM Date: 05/04/15  Intake/Output from previous day: 04/08 0701 - 04/09 0700 In: 1260 [P.O.:660; IV Piggyback:600] Out: 2602 [Urine:2600; Stool:2] Intake/Output this shift:    General appearance: alert and cooperative GI: minimal to no tenderness LLQ  Lab Results:   Recent Labs  05/04/15 0443 05/05/15 0553  WBC 9.7 8.6  HGB 9.3* 8.8*  HCT 29.2* 28.6*  PLT 465* 479*   BMET  Recent Labs  05/04/15 0443 05/05/15 0553  NA 136 138  K 3.5 2.3*  CL 92* 89*  CO2 29 35*  GLUCOSE 95 100*  BUN 32* 28*  CREATININE 1.40* 1.24*  CALCIUM 8.7* 8.6*   PT/INR  Recent Labs  05/04/15 0443 05/05/15 0553  LABPROT 26.6* 24.8*  INR 2.48* 2.27*   ABG No results for input(s): PHART, HCO3 in the last 72 hours.  Invalid input(s): PCO2, PO2  Studies/Results: No results found.  Anti-infectives: Anti-infectives    Start     Dose/Rate Route Frequency Ordered Stop   05/02/15 1300  cefTRIAXone (ROCEPHIN) 2 g in dextrose 5 % 50 mL IVPB     2 g 100 mL/hr over 30 Minutes Intravenous Every 24 hours 05/02/15 1222     05/02/15 1300  metroNIDAZOLE (FLAGYL) IVPB 500 mg     500 mg 100 mL/hr over 60 Minutes Intravenous Every 8 hours 05/02/15 1222        Assessment/Plan: Sigmoid diverticulitis with contained perforation into mesentery - change to oral ABX today and rec 7d course (Augmentin alone or Flagyl plus cephalosporin like she is on now). F/U with Dr. Dalbert Batman at [facility] as previously planned. Low residue diet.  LOS: 4 days    [name] E 05/05/2015

## 2015-05-05 NOTE — Discharge Summary (Signed)
[facility] Discharge Summary  Patient name: [name] Medical record number: [id number] Date of birth: 1923/10/11 Age: 82 y.o. Gender: female Date of Admission: 05/01/2015  Date of Discharge: 05/08/2015  Admitting Physician: Dickie La, MD/  Primary Care Provider: [name], MD Consultants: Surgery, Cardiology  Indication for Hospitalization: shortness of breath  Discharge Diagnoses/Problem List:  HFpEF Afib Hypertension Tachy-brachy syndrome Pacemaker Pericardial window s/p pericardial window Diverticulitis  Disposition: home  Discharge Condition: improved and stable  Discharge Exam:    Brief Hospital Course:  [name] is a 82 yo F with history of Arib, HTN, DM-2, tachy-brady syndrome s/p pacemaker,  pericardial effusion s/p pericardial window 12/16, HFpEF,OSA (no CPAP), stroke (right frontal 2013) and recurrent diverticulitis, who presents from PCP office with worsening shortness of breath and fever for two days prior to admission.   Dyspnea 2/2 CHF exacerbation: on admission, patient with 3+ pitting edema to abdomen, bibasilar crackles, JVD and hepatojugular reflux. Weight up by about 30 lbs (176 on admission). Dry weight about 140 lbs. BNP elevated to 400. CXR with significant interstitial pulmonary edema. She had no chest pain. EKG without sign of ACS. Troponin 0.03>0.03>0.04. ECHO in 01/2015 with EF 0000000, no diastolic dysfunction, moderate RAE, LAE, and PAPP of 38.   Patient  was started on IV lasix 40 mg three times a day without adequate urine output. So metolazone added at 5 mg daily and lasix increased to 80 mg three times a day with good urine output and significant improvement in her breathing, edema and AKI. Her creatinine trended down from 1.41 to 1.21.  Cardiology notified about patients admission and plan of care, were in agreement.  On the day prior to discharge, she was transisioned to oral torsemide 80 mg  daily. Metolazone was also reduced to 2.5 mg daily, and she continued to diurese well. Weight down to 145 lbs (from 176 lbs on admission).   Patient had some contraction alkalosis and hypokalemia while aggressively diuresed. She received KCl with subsequent resolution of her hypokalemia. Her contraction alkalosis remained stable as well.   Patient without oxygen need for most of her hospitalization. However, she had desaturation to 70% when ambulated on room air. So home oxygen was ordered at time of discharge.   Patient to follow up with PCP and cardiology  Diverticulitis: patient with recent admission (04/03/2015 to 04/11/2015) for diverticulitis. Discharged on Augmentin and oral vanc (for C. Diff) that she completed at home. She presented with fever to 100.8 at home two days prior to admission. WBC at 11.1K. Exam remarkable for LLQ mass about a golf ball size over LLQ. No such mass noted on CT abdomen during previous admission. KUB normal. Repeat CT abdomen on 04/06 with new contained perforation into the sigmoid mesentery, no drainable fluid collection, and stool retention above the thickened sigmoid colon. Surgery was consulted and opted to manage medically with CTX and flagyl (4/6 to 4/10) as she failed Cipro/flagyl, and Zosyn during previous admission. Patient remained afebrile throughout her stay. Leukocytosis resolved. She was transitioned to Augmentin on 4/11 and discharged on this to continue through 05/11/2015. Patient to follow up with surgery and GI on outpatient basis.  UTI: patient with no dysuria but urgency on presentation. She was also on diuretics as well. UA with many bacteria, few LE and positive for nitrite. Urine culture with Klebsiella 70K and citrobacter 60K. Both sesitive to CTX. She received ceftriaxone for 5 days.  Other chronic conditions were  stable.  Issues for Follow Up:  1. CHF: patient discharged on torsemide 80 mg daily, metolazone 2.5 mg daily and K-dur 20 mEq  twice a day. Recommend close follow up on her fluid status and BMP 2. Diverticulitis: patient to follow up with surgery and GI 3. Anemia: Hgb slowly trending down (11-9.2) over the course of last month but stable at 9.2 on discharge. Recommend checking CBC  Significant Procedures: none  Significant Labs and Imaging:   Recent Labs Lab 05/04/15 0443 05/05/15 0553 05/06/15 0331  WBC 9.7 8.6 9.6  HGB 9.3* 8.8* 9.2*  HCT 29.2* 28.6* 29.8*  PLT 465* 479* 445*    Recent Labs Lab 05/05/15 0553 05/05/15 1050 05/05/15 1538 05/06/15 0331 05/07/15 0346 05/08/15 0729  NA 138  --  136 137 137 139  K 2.3*  --  2.9* 3.4* 3.3* 3.5  CL 89*  --  87* 89* 87* 89*  CO2 35*  --  36* 34* 36* 38*  GLUCOSE 100*  --  124* 121* 137* 144*  BUN 28*  --  30* 28* 28* 26*  CREATININE 1.24*  --  1.22* 1.23* 1.21* 1.21*  CALCIUM 8.6*  --  9.1 8.7* 8.9 9.0  MG  --  1.9  --   --   --   --      Results/Tests Pending at Time of Discharge: none  Discharge Medications:    Medication List    TAKE these medications        amoxicillin-clavulanate 500-125 MG tablet  Commonly known as:  AUGMENTIN  Take 1 tablet (500 mg total) by mouth every 12 (twelve) hours. Start tonight.     Digoxin 62.5 MCG Tabs  Take 0.0625 mg by mouth daily. Take only 5 days a week; skip Wednesdays and Sundays     gabapentin 100 MG capsule  Commonly known as:  NEURONTIN  Take 1 capsule (100 mg total) by mouth 3 (three) times daily.     hydrocortisone-pramoxine 2.5-1 % rectal cream  Commonly known as:  ANALPRAM-HC  Place 1 application rectally 3 (three) times daily. Reported on 05/01/2015     metolazone 2.5 MG tablet  Commonly known as:  ZAROXOLYN  Take 1 tablet (2.5 mg total) by mouth daily.     ondansetron 4 MG disintegrating tablet  Commonly known as:  ZOFRAN ODT  Take 1 tablet (4 mg total) by mouth every 8 (eight) hours as needed for nausea or vomiting.     potassium chloride 10 MEQ tablet  Commonly known as:   K-DUR,KLOR-CON  Take 2 tablets (20 mEq total) by mouth 2 (two) times daily. Take with Torsemide     torsemide 20 MG tablet  Commonly known as:  DEMADEX  Take 4 tablets (80 mg total) by mouth daily.     verapamil 180 MG CR tablet  Commonly known as:  CALAN-SR  take 1 tablet by mouth at bedtime     warfarin 5 MG tablet  Commonly known as:  COUMADIN  Take 2.5-5 mg by mouth as directed. Takes 0.5 tablet (2.5 mg total) by mouth Monday, Wednesday, Friday, Saturday. Take 1 tablet (5 mg total) by mouth Sunday, Tuesday, Thursday.        Discharge Instructions: Please refer to Patient Instructions section of EMR for full details.  Patient was counseled important signs and symptoms that should prompt return to medical care, changes in medications, dietary instructions, activity restrictions, and follow up appointments.   Follow-Up Appointments: Follow-up Information  Follow up with Adin Hector, MD On 05/17/2015.   Specialty:  General Surgery   Why:  At 3:05pm for follow up for diverticulitis   Contact information:   [address] [place] [address] [phone]       Follow up with [facility] On 05/16/2015.   Specialty:  Cardiology   Why:  Cardiology Hospital Follow-up on 05/16/2015 at 1:30PM with Ignacia Bayley, NP.   Contact information:   [address] [place] [phone]      Follow up with [facility].   Specialty:  [place]   Why:  [facility], they will send a home health care nurse out to your home   Contact information:   [place] [id number] [phone]       Mercy Riding, MD 05/08/2015, 3:05 PM PGY-1, [place]

## 2015-05-05 NOTE — Progress Notes (Signed)
Family Medicine Teaching Service Daily Progress Note Intern Pager: [phone]  Patient name: [name] Medical record number: [id number] Date of birth: 1923-02-26 Age: 82 y.o. Gender: female  Primary Care Provider: [name], MD Consultants: Surgery  Code Status: full  Pt Overview and Major Events to Date:  04/05-admitted with CHF exacerbation.  ABX/Culture CTX 4/6>> Flagyl 4/6>>  Urine culture 4/6>>reincubated for better growth  Assessment and Plan: [name] is a 82 y.o. female presenting with shortness of breath and fever. PMH is significant for atrial fibrillation, hx of CVA, hypertension, type 2 diabetes, tachycardia-bradycardia syndrome s/p pacemaker, prior pericardial effusion s/p pericardial window 12/16, sleep apnea (no CPAP), stroke (right frontal 2013), GI bleeding with hemorrhoids, and arthritis and recurrent diverticulitis.  Dyspnea: improving. Likely 2/2 CHF exacerbation. ECHO in 01/2015 with EF 0000000, no diastolic dysfunction. Mod RAE, LAE and PAPP of 38.Dry weight about 70 kg. - I/O: UOP 2.6L, net -1342/-2177 - Continue IV lasix 80 mg Q8 - Continue metolazone 5 mg daily - hold home toresamide - Card consulted over the phone: not impressed with EKG given ventricular pacing in Afib - PT: no follow up  - advised to be out of bed  Recurrent diverticulitis:  Improving. Afebrile here. Palpable mass over LLQ. CT abdomen 04/06 with new contained perforation into the sigmoid mesentery.  -Appreciate surg recs:   -PO Augmentin or continue current antibiotics given poor response to Cipro and flagyl prior admission. I will continue current antibiotics for tow more days for UTI -OP f/u with Dr. Dalbert Batman and Dr. Watt Climes for scope  AKI: resolving.  Cr 1.4>>1.24. 1.0 on discharge on 03/16. Likely secondary to CHF (cardiorenal syndrome) - BMP this afternoon  Metabolic alkalosis/Hypokalemia/hypochloremia: K 2.3 this morning. Bicarb 35 and Cl 89. Likely secondary to  diuretics. Could also be due to low EABV. -K-dur 40 mEq 5 timmes a day. Six doses total -BMP in the afternoon -Mg level -If no improvement, will discontinue metolazone on 4/10  UTI: no dysuria but urgency. She is on diuretics as well. UA with many bacteria, few LE and positive for nitrite. Urine culture with Klebsiella 70K and citrobacter 60K. Both sesitive to CTX. -Antibiotics as above  Atrial fibrillation:  Digoxin level 0.8, normal today. INR therapeutic - Continue Verapamil and Digoxin  - Continue warfarin per pharmacy - Daily PT/INR  Anemia: Hgb 8.8, slowly trending down (baseline 11). MCV 84% argues against IDA. History of diverticulitis and hemorrhoids - monitor CBC  Back pain: history of chronic back pain, She says she was on Tylenol #3 as needed at home. Not listed on her home meds. Not responding to plain tylenol or tramadol -Oxycodone 2.5 mg q4h as needed pain  FEN/GI: heart healthy/saline lock  Prophylaxis: on warfarin for Afib  Disposition: pending diuresing.   Subjective:  Sitting on the edge of the bed coloring. Denies shortness of breath or chest pain. Back pain controlled.   Objective: Temp:  [98 F (36.7 C)-98.2 F (36.8 C)] 98.2 F (36.8 C) (04/09 0414) Pulse Rate:  [52-68] 52 (04/09 0414) Resp:  [16-18] 16 (04/09 0414) BP: (122-128)/(48-56) 124/48 mmHg (04/09 0414) SpO2:  [90 %-93 %] 93 % (04/09 0414) Weight:  [156 lb 1.6 oz (70.806 kg)] 156 lb 1.6 oz (70.806 kg) (04/09 0414)  Physical Exam: Gen: appears well, pleasant Neck: supple, no LAD,  CV: regular rate and rythm. S1 & S2 audible, 2/6 murmurs over right and left sternal border, 2/3+ pitting edema bilaterally up to the knees, JVD+,  Resp: on room air,  no apparent work of breathing, no crackles .  GI: bowel sounds normal, no tenderness .   GU: no suprapubic tenderness Skin: no lesions Neuro: alert oriented without gross deficit  Laboratory:  Recent Labs Lab 05/03/15 0431 05/04/15 0443  05/05/15 0553  WBC 10.3 9.7 8.6  HGB 9.6* 9.3* 8.8*  HCT 30.1* 29.2* 28.6*  PLT 539* 465* 479*    Recent Labs Lab 05/01/15 1237  05/03/15 0727 05/04/15 0443 05/05/15 0553  NA 139  < > 134* 136 138  K 5.2  < > 4.3 3.5 PENDING  CL 98  < > 92* 92* 89*  CO2 32*  < > 29 29 35*  BUN 28*  < > 30* 32* 28*  CREATININE 1.51*  < > 1.46* 1.40* 1.24*  CALCIUM 9.1  < > 8.6* 8.7* 8.6*  PROT 6.7  --   --   --   --   BILITOT 0.7  --   --   --   --   ALKPHOS 118  --   --   --   --   ALT 22  --   --   --   --   AST 37*  --   --   --   --   GLUCOSE 109*  < > 117* 95 100*  < > = values in this interval not displayed.  Imaging/Diagnostic Tests: No results found.  Mercy Riding, MD 05/05/2015, 6:58 AM PGY-1, [place] Intern pager: [phone], text pages welcome

## 2015-05-05 NOTE — Progress Notes (Signed)
ANTICOAGULATION CONSULT NOTE - Follow Up Consult  Pharmacy Consult for warfarin Indication: atrial fibrillation  Allergies  Allergen Reactions  . Iodinated Diagnostic Agents Hives    HIVES 15MIN S/P IV CONTRAST INJECTION,WILL NEED 13 HR PREP FOR FUTURE INJECTIONS, ok s/p 50mg  po benadryl//a.calhoun  . Anti-Inflammatory Enzyme [Nutritional Supplements]     Retains fluids and headaches  . Arthrotec [Diclofenac-Misoprostol] Other (See Comments)    unknown  . Biaxin [Clarithromycin] Other (See Comments)    unknown  . Plavix [Clopidogrel Bisulfate] Other (See Comments)    unknown  . Spironolactone     Hair loss    Patient Measurements: Height: 5\' 5"  (165.1 cm) Weight: 156 lb 1.6 oz (70.806 kg) (Scale C) IBW/kg (Calculated) : 57  Vital Signs: Temp: 98.2 F (36.8 C) (04/09 0414) Temp Source: Oral (04/09 0414) BP: 124/48 mmHg (04/09 0414) Pulse Rate: 52 (04/09 0414)  Labs:  Recent Labs  05/03/15 0431 05/03/15 0727 05/04/15 0443 05/05/15 0553  HGB 9.6*  --  9.3* 8.8*  HCT 30.1*  --  29.2* 28.6*  PLT 539*  --  465* 479*  LABPROT 26.6*  --  26.6* 24.8*  INR 2.49*  --  2.48* 2.27*  CREATININE  --  1.46* 1.40* 1.24*    Estimated Creatinine Clearance: 29.8 mL/min (by C-G formula based on Cr of 1.24).   Medications:  Prescriptions prior to admission  Medication Sig Dispense Refill Last Dose  . Digoxin 62.5 MCG TABS Take 0.0625 mg by mouth daily. Take only 5 days a week; skip Wednesdays and Sundays 30 tablet 6 05/01/2015 at Unknown time  . gabapentin (NEURONTIN) 100 MG capsule Take 1 capsule (100 mg total) by mouth 3 (three) times daily. 90 capsule 3 05/01/2015 at Unknown time  . potassium chloride (K-DUR,KLOR-CON) 10 MEQ tablet Take 2 tablets (20 mEq total) by mouth 2 (two) times daily. Take with Torsemide 120 tablet 0 05/01/2015 at Unknown time  . torsemide (DEMADEX) 20 MG tablet Take 3 tablets (60 mg total) by mouth daily. 180 tablet 6 05/01/2015 at Unknown time  . verapamil  (CALAN-SR) 180 MG CR tablet take 1 tablet by mouth at bedtime 30 tablet 11 04/30/2015 at Unknown time  . warfarin (COUMADIN) 5 MG tablet Take 2.5-5 mg by mouth as directed. Takes 0.5 tablet (2.5 mg total) by mouth Monday, Wednesday, Friday, Saturday. Take 1 tablet (5 mg total) by mouth Sunday, Tuesday, Thursday.   Past Week at Unknown time  . hydrocortisone-pramoxine (ANALPRAM-HC) 2.5-1 % rectal cream Place 1 application rectally 3 (three) times daily. Reported on 05/01/2015   Not Taking  . ondansetron (ZOFRAN ODT) 4 MG disintegrating tablet Take 1 tablet (4 mg total) by mouth every 8 (eight) hours as needed for nausea or vomiting. (Patient not taking: Reported on 05/01/2015) 20 tablet 0 Not Taking at Unknown time    Assessment: 69 yoF admitted 05/01/2015 on warfarin PTA for Afib. She does have a history of blood in stool thought likely due to hemorrhoids.  PTA warfarin: warfarin 5 mg q Sun/Tues/Thurs, 2.5 mg all other days   INR 2.27. Hgb stable, Plt 479. No s/sx of bleeding noted.  Goal of Therapy:  INR 2-3 Monitor platelets by anticoagulation protocol: Yes   Plan:  - Warfarin 3 mg x1 tonight - Monitor daily INR - potential for DDI with metronidazole - Daily CBC and monitor for s/sx of bleeding - warfarin re-education  [name], [name] Clinical Pharmacy Resident Pager: [phone]  05/05/2015,8:15 AM

## 2015-05-05 NOTE — Discharge Instructions (Addendum)
It has been a pleasure taking care of you! You were admitted due to shortness of breath and leg swelling likely from fluid build up. We have treated your with medication to get rid of the fluid. With that, your breathing and swelling improved to the extent we think it is safe to go home and continue your medication. However, due to low oxygen level with ambulation, we have started you on oxygen that you need to use at home. We are discharging you on more fluid medications, potassium pills and antibiotic (for diverticulitis) that you need to continue taking at home. We may have also made some adjustments to your other medications. Please, make sure to read the directions before you take them. The names and directions on how to take these medications are found on this discharge paper under medication section.  You also need a follow up with your primary care doctor on 05/10/2015 for hospital follow up and blood work. You can walk in. Your primary care doctor office doesn't need appointment.  Take care,  Information on my medicine - Coumadin   (Warfarin)  This medication education was reviewed with me or my healthcare representative as part of my discharge preparation.  Why was Coumadin prescribed for you? Coumadin was prescribed for you because you have a blood clot or a medical condition that can cause an increased risk of forming blood clots. Blood clots can cause serious health problems by blocking the flow of blood to the heart, lung, or brain. Coumadin can prevent harmful blood clots from forming. As a reminder your indication for Coumadin is:   Stroke Prevention Because Of Atrial Fibrillation  What test will check on my response to Coumadin? While on Coumadin (warfarin) you will need to have an INR test regularly to ensure that your dose is keeping you in the desired range. The INR (international normalized ratio) number is calculated from the result of the laboratory test called prothrombin  time (PT).  If an INR APPOINTMENT HAS NOT ALREADY BEEN MADE FOR YOU please schedule an appointment to have this lab work done by your health care provider within 7 days. Your INR goal is usually a number between:  2 to 3 or your provider may give you a more narrow range like 2-2.5.  Ask your health care provider during an office visit what your goal INR is.  What  do you need to  know  About  COUMADIN? Take Coumadin (warfarin) exactly as prescribed by your healthcare provider about the same time each day.  DO NOT stop taking without talking to the doctor who prescribed the medication.  Stopping without other blood clot prevention medication to take the place of Coumadin may increase your risk of developing a new clot or stroke.  Get refills before you run out.  What do you do if you miss a dose? If you miss a dose, take it as soon as you remember on the same day then continue your regularly scheduled regimen the next day.  Do not take two doses of Coumadin at the same time.  Important Safety Information A possible side effect of Coumadin (Warfarin) is an increased risk of bleeding. You should call your healthcare provider right away if you experience any of the following: ? Bleeding from an injury or your nose that does not stop. ? Unusual colored urine (red or dark brown) or unusual colored stools (red or black). ? Unusual bruising for unknown reasons. ? A serious fall or if you  hit your head (even if there is no bleeding).  Some foods or medicines interact with Coumadin (warfarin) and might alter your response to warfarin. To help avoid this: ? Eat a balanced diet, maintaining a consistent amount of Vitamin K. ? Notify your provider about major diet changes you plan to make. ? Avoid alcohol or limit your intake to 1 drink for women and 2 drinks for men per day. (1 drink is 5 oz. wine, 12 oz. beer, or 1.5 oz. liquor.)  Make sure that ANY health care provider who prescribes medication for  you knows that you are taking Coumadin (warfarin).  Also make sure the healthcare provider who is monitoring your Coumadin knows when you have started a new medication including herbals and non-prescription products.  Coumadin (Warfarin)  Major Drug Interactions  Increased Warfarin Effect Decreased Warfarin Effect  Alcohol (large quantities) Antibiotics (esp. Septra/Bactrim, Flagyl, Cipro) Amiodarone (Cordarone) Aspirin (ASA) Cimetidine (Tagamet) Megestrol (Megace) NSAIDs (ibuprofen, naproxen, etc.) Piroxicam (Feldene) Propafenone (Rythmol SR) Propranolol (Inderal) Isoniazid (INH) Posaconazole (Noxafil) Barbiturates (Phenobarbital) Carbamazepine (Tegretol) Chlordiazepoxide (Librium) Cholestyramine (Questran) Griseofulvin Oral Contraceptives Rifampin Sucralfate (Carafate) Vitamin K   Coumadin (Warfarin) Major Herbal Interactions  Increased Warfarin Effect Decreased Warfarin Effect  Garlic Ginseng Ginkgo biloba Coenzyme Q10 Green tea St. Johns wort    Coumadin (Warfarin) FOOD Interactions  Eat a consistent number of servings per week of foods HIGH in Vitamin K (1 serving =  cup)  Collards (cooked, or boiled & drained) Kale (cooked, or boiled & drained) Mustard greens (cooked, or boiled & drained) Parsley *serving size only =  cup Spinach (cooked, or boiled & drained) Swiss chard (cooked, or boiled & drained) Turnip greens (cooked, or boiled & drained)  Eat a consistent number of servings per week of foods MEDIUM-HIGH in Vitamin K (1 serving = 1 cup)  Asparagus (cooked, or boiled & drained) Broccoli (cooked, boiled & drained, or raw & chopped) Brussel sprouts (cooked, or boiled & drained) *serving size only =  cup Lettuce, raw (green leaf, endive, romaine) Spinach, raw Turnip greens, raw & chopped   These websites have more information on Coumadin (warfarin):  FailFactory.se; VeganReport.com.au;

## 2015-05-06 DIAGNOSIS — I509 Heart failure, unspecified: Secondary | ICD-10-CM

## 2015-05-06 DIAGNOSIS — I482 Chronic atrial fibrillation: Secondary | ICD-10-CM

## 2015-05-06 DIAGNOSIS — K5790 Diverticulosis of intestine, part unspecified, without perforation or abscess without bleeding: Secondary | ICD-10-CM

## 2015-05-06 LAB — CBC
HEMATOCRIT: 29.8 % — AB (ref 36.0–46.0)
HEMOGLOBIN: 9.2 g/dL — AB (ref 12.0–15.0)
MCH: 26.1 pg (ref 26.0–34.0)
MCHC: 30.9 g/dL (ref 30.0–36.0)
MCV: 84.7 fL (ref 78.0–100.0)
Platelets: 445 10*3/uL — ABNORMAL HIGH (ref 150–400)
RBC: 3.52 MIL/uL — ABNORMAL LOW (ref 3.87–5.11)
RDW: 14.2 % (ref 11.5–15.5)
WBC: 9.6 10*3/uL (ref 4.0–10.5)

## 2015-05-06 LAB — BASIC METABOLIC PANEL
Anion gap: 14 (ref 5–15)
BUN: 28 mg/dL — ABNORMAL HIGH (ref 6–20)
CHLORIDE: 89 mmol/L — AB (ref 101–111)
CO2: 34 mmol/L — ABNORMAL HIGH (ref 22–32)
CREATININE: 1.23 mg/dL — AB (ref 0.44–1.00)
Calcium: 8.7 mg/dL — ABNORMAL LOW (ref 8.9–10.3)
GFR calc non Af Amer: 37 mL/min — ABNORMAL LOW (ref >60)
GFR, EST AFRICAN AMERICAN: 43 mL/min — AB (ref >60)
GLUCOSE: 121 mg/dL — AB (ref 65–99)
Potassium: 3.4 mmol/L — ABNORMAL LOW (ref 3.5–5.1)
Sodium: 137 mmol/L (ref 135–145)

## 2015-05-06 LAB — PROTIME-INR
INR: 2.26 — AB (ref 0.00–1.49)
Prothrombin Time: 24.7 seconds — ABNORMAL HIGH (ref 11.6–15.2)

## 2015-05-06 MED ORDER — AMOXICILLIN-POT CLAVULANATE 500-125 MG PO TABS
1.0000 | ORAL_TABLET | Freq: Two times a day (BID) | ORAL | Status: DC
  Administered 2015-05-07 – 2015-05-08 (×3): 500 mg via ORAL
  Filled 2015-05-06 (×4): qty 1

## 2015-05-06 MED ORDER — AMOXICILLIN-POT CLAVULANATE 875-125 MG PO TABS: 1.0000 | ORAL_TABLET | Freq: Two times a day (BID) | ORAL | Status: DC

## 2015-05-06 MED ORDER — POTASSIUM CHLORIDE CRYS ER 20 MEQ PO TBCR
40.0000 meq | EXTENDED_RELEASE_TABLET | Freq: Three times a day (TID) | ORAL | Status: DC
  Administered 2015-05-06 – 2015-05-08 (×7): 40 meq via ORAL
  Filled 2015-05-06 (×7): qty 2

## 2015-05-06 MED ORDER — POTASSIUM CHLORIDE CRYS ER 20 MEQ PO TBCR: 40.0000 meq | EXTENDED_RELEASE_TABLET | Freq: Three times a day (TID) | ORAL | Status: DC

## 2015-05-06 MED ORDER — WARFARIN SODIUM 3 MG PO TABS
3.0000 mg | ORAL_TABLET | Freq: Once | ORAL | Status: AC
  Administered 2015-05-06: 3 mg via ORAL
  Filled 2015-05-06: qty 1

## 2015-05-06 NOTE — Consult Note (Signed)
CARDIOLOGY CONSULT NOTE   Patient ID: [name] MRN: [id number], DOB/AGE: 03-12-1923   Admit date: 05/01/2015 Date of Consult: 05/06/2015   Primary Physician: [name], MD Primary Cardiologist: Dr. Sallyanne Kuster Referred by Dr. Lenice Llamas, Family Medicine  Pt. Profile  [name] is frail 82 yo caucasian female with PMH of chronic afib on coumadin, h/o tachybrady s/p Medtronic PPM, pericardial effusion status post pericardial window placement December 2016, HTN, history of GI bleed, CVA, chronic diastolic heart failure presented with subjective fever and SOB and found to be fluid overloaded.   Problem List  Past Medical History  Diagnosis Date  . Pericardial effusion     a. s/p pericardial window 01/16/15  . Atrial fibrillation (HCC)     Chronic  . Chronic anticoagulation     on Coumadin  . History of GI bleed     once treated with Fe infusion  . HTN (hypertension)   . Pacemaker     a. Medtronic, placed for tachybrady.   . Obesity   . LVH (left ventricular hypertrophy)   . Sleep apnea     mild-no cpap  . Positive TB test   . Anemia   . Stroke [facility])     a. 2013: right frontal  . Arthritis   . Tachycardia-bradycardia syndrome [facility])     a. s/p Medtronic Sensia  model number [id number], serial number [id number] H 12/2013  . History of shingles   . Chronic diastolic (congestive) heart failure ([place])   . Chronic atrial fibrillation (HCC)     a. on coumadin, digoxin and calan  . GI bleed     a. 2/2 AVMs    Past Surgical History  Procedure Laterality Date  . Breast lumpectomy Bilateral     negative for cancer  . Esophagogastroduodenoscopy  05/19/2011    Procedure: ESOPHAGOGASTRODUODENOSCOPY (EGD);  Surgeon: Lear Ng, MD;  Location: [facility] ENDOSCOPY;  Service: Endoscopy;  Laterality: N/A;  doctor aware of inr   will try to be here no later than 230  . Cholecystectomy  2005  . Esophagogastroduodenoscopy  06/22/2011    Procedure: ESOPHAGOGASTRODUODENOSCOPY (EGD);   Surgeon: Arta Silence, MD;  Location: [facility] ENDOSCOPY;  Service: Endoscopy;  Laterality: N/A;  Check PT/INR in am  . Givens capsule study  06/23/2011    Procedure: GIVENS CAPSULE STUDY;  Surgeon: Arta Silence, MD;  Location: [facility] ENDOSCOPY;  Service: Endoscopy;  Laterality: N/A;  . Colonoscopy  08/11/2011    Procedure: COLONOSCOPY;  Surgeon: Jeryl Columbia, MD;  Location: [facility] ENDOSCOPY;  Service: Endoscopy;  Laterality: N/A;  . Hot hemostasis  08/11/2011    Procedure: HOT HEMOSTASIS (ARGON PLASMA COAGULATION/BICAP);  Surgeon: Jeryl Columbia, MD;  Location: [facility] ENDOSCOPY;  Service: Endoscopy;  Laterality: N/A;  . Mass excision Left 09/14/2013    Procedure: EXCISION MASS LEFT WRIST;  Surgeon: Leanora Cover, MD;  Location: [place];  Service: Orthopedics;  Laterality: Left;  . Tonsillectomy and adenoidectomy  1950  . Insert / replace / remove pacemaker  2001    Last generator in 2006; interrogated Dec 2012  . Cataract extraction w/ intraocular lens  implant, bilateral Bilateral   . Lipoma excision Left 07/2013    wrist  . Pacemaker generator change N/A 01/02/2014    Procedure: PACEMAKER GENERATOR CHANGE;  Surgeon: Sanda Klein, MD;  Location: [place] CATH LAB;  Service: Cardiovascular;  Laterality: N/A;  . Lead revision N/A 01/02/2014    Procedure: LEAD REVISION;  Surgeon: Sanda Klein, MD;  Location: [place] CATH LAB;  Service: Cardiovascular;  Laterality: N/A;  . Subxyphoid pericardial window N/A 01/16/2015    Procedure: SUBXYPHOID PERICARDIAL WINDOW;  Surgeon: Grace Isaac, MD;  Location: [place];  Service: Thoracic;  Laterality: N/A;  . Tee without cardioversion N/A 01/16/2015    Procedure: TRANSESOPHAGEAL ECHOCARDIOGRAM (TEE);  Surgeon: Grace Isaac, MD;  Location: [facility] OR;  Service: Thoracic;  Laterality: N/A;     Allergies  Allergies  Allergen Reactions  . Iodinated Diagnostic Agents Hives    HIVES 15MIN S/P IV CONTRAST INJECTION,WILL NEED 13 HR PREP FOR FUTURE INJECTIONS, ok s/p  50mg  po benadryl//a.calhoun  . Anti-Inflammatory Enzyme [Nutritional Supplements]     Retains fluids and headaches  . Arthrotec [Diclofenac-Misoprostol] Other (See Comments)    unknown  . Biaxin [Clarithromycin] Other (See Comments)    unknown  . Plavix [Clopidogrel Bisulfate] Other (See Comments)    unknown  . Spironolactone     Hair loss    HPI   [name] is frail 82 yo caucasian female with PMH of chronic afib on coumadin, h/o tachybrady s/p Medtronic PPM, pericardial effusion status post pericardial window placement December 2016, HTN, history of GI bleed, CVA, chronic diastolic heart failure. Her last echocardiogram obtained on 02/21/2015 shows EF 55-60%, no regional wall motion abnormality, mild-to-moderate AR, mild MR, PA peak pressure 38 mmHg. She was most recently admitted for acute diverticulitis and c-diff infection. She responded well to PO vancomycin, she was noted to have significant lower extremity edema even during the last admission. After a prolonged hospitalization, she was eventually discharged on 04/11/2015.   After discharge, she has been doing relatively well. Eventually however she presented back to the hospital on 05/01/2015 as her temperature has been "creeping up". Although patient denies any knowledge of shortness of breath at this time, according to admission note on 4/5, she was short of breath on arrival. She was felt to be in acute heart failure exacerbation. She also reported new golf ball-sized mass in the left lower quadrant. CT of abdomen and pelvis without contrast was obtained on 4/6 which showed improved sigmoid colon inflammation but a new contained perforation into the sigmoid mesentery. Gen. surgery was consulted, who plan to continue treatment with antibiotics. As for her significant lower extremity edema, she was also diuresed with IV Lasix. Despite placing on 80 mg 3 times a day of IV Lasix, her renal function has been stable. However she has only managed  to diuresis 3 L in the last 4 days. Fortunately it appears her weight has dropped from 168 lbs down to 153 lbs. Given persistent lower extremity edema, cardiology has been consulted for acute on chronic diastolic heart failure.     Inpatient Medications  . Barium Sulfate  900 mL Oral UD  . digoxin  0.0625 mg Oral Once per day on Mon Tue Thu Fri  . furosemide  80 mg Intravenous 3 times per day  . gabapentin  100 mg Oral TID  . metolazone  5 mg Oral Daily  . metronidazole  500 mg Intravenous Q8H  . potassium chloride  40 mEq Oral TID  . sodium chloride flush  3 mL Intravenous Q12H  . verapamil  180 mg Oral QHS  . warfarin  3 mg Oral ONCE-1800  . Warfarin - Pharmacist Dosing Inpatient   Does not apply q1800    Family History Family History  Problem Relation Age of Onset  . Stroke Mother   . Stroke Father   .  Pneumonia Father   . Colon cancer Sister   . Colon cancer Daughter      Social History Social History   Social History  . Marital Status: Widowed    Spouse Name: N/A  . Number of Children: N/A  . Years of Education: N/A   Occupational History  . Not on file.   Social History Main Topics  . Smoking status: Never Smoker   . Smokeless tobacco: Never Used  . Alcohol Use: 0.0 oz/week    0 Standard drinks or equivalent per week     Comment: 01/02/2014 "I'll have a drink a few times/year"  . Drug Use: No  . Sexual Activity: No   Other Topics Concern  . Not on file   Social History Narrative     Review of Systems  General:  No chills, night sweats or weight changes.  +fever Cardiovascular:  No chest pain, dyspnea on exertion, edema, orthopnea, palpitations, paroxysmal nocturnal dyspnea. Dermatological: No rash, lesions/masses Respiratory: No cough, dyspnea Urologic: No hematuria, dysuria Abdominal:   No nausea, vomiting, diarrhea, bright red blood per rectum, melena, or hematemesis +mild abdominal tenderness Neurologic:  No visual changes, wkns, changes in  mental status. All other systems reviewed and are otherwise negative except as noted above.  Physical Exam  Blood pressure 115/47, pulse 64, temperature 98.1 F (36.7 C), temperature source Oral, resp. rate 18, height 5\' 5"  (1.651 m), weight 153 lb 6.4 oz (69.582 kg), SpO2 96 %.  General: Pleasant, NAD Psych: Normal affect. Neuro: Alert and oriented X 3. Moves all extremities spontaneously. HEENT: Normal  Neck: Supple without bruits or JVD. Lungs:  Resp regular and unlabored, CTA. Heart: RRR no s3, s4. 2/6 systolic murmur at apex Abdomen: Soft, BS + x 4. Mild distended in LLQ, mild tenderness on palpation Extremities: No clubbing, cyanosis. DP/PT/Radials 2+ and equal bilaterally. 3+ pitting edema  Labs  No results for input(s): CKTOTAL, CKMB, TROPONINI in the last 72 hours. Lab Results  Component Value Date   WBC 9.6 05/06/2015   HGB 9.2* 05/06/2015   HCT 29.8* 05/06/2015   MCV 84.7 05/06/2015   PLT 445* 05/06/2015    Recent Labs Lab 05/01/15 1237  05/06/15 0331  NA 139  < > 137  K 5.2  < > 3.4*  CL 98  < > 89*  CO2 32*  < > 34*  BUN 28*  < > 28*  CREATININE 1.51*  < > 1.23*  CALCIUM 9.1  < > 8.7*  PROT 6.7  --   --   BILITOT 0.7  --   --   ALKPHOS 118  --   --   ALT 22  --   --   AST 37*  --   --   GLUCOSE 109*  < > 121*  < > = values in this interval not displayed. Lab Results  Component Value Date   CHOL 160 08/03/2013   HDL 37* 08/03/2013   LDLCALC 100* 08/03/2013   TRIG 114 08/03/2013   No results found for: DDIMER  Radiology/Studies  Ct Abdomen Pelvis Wo Contrast  05/02/2015  CLINICAL DATA:  Left lower quadrant abdominal mass.  Diverticulitis EXAM: CT ABDOMEN AND PELVIS WITHOUT CONTRAST TECHNIQUE: Multidetector CT imaging of the abdomen and pelvis was performed following the standard protocol without IV contrast. COMPARISON:  04/03/2015 FINDINGS: Lower chest and abdominal wall: Marked cardiomegaly with right ventricular pacer leads. Large pericardial  effusion, chronic and water density. New small and layering pleural effusions, greater  on the right. Basilar atelectasis. Anasarca Hepatobiliary: No focal liver abnormality.Cholecystectomy. No bile duct distention. Pancreas: Unremarkable. Spleen: Unremarkable. Adrenals/Urinary Tract: Negative adrenals. No hydronephrosis or stone. Sub cm -density lesions in the right kidney are likely hemorrhagic cysts. Smaller high-density foci in the left kidney are likely the same. Apparent 18 mm soft tissue density nodule exophytic from the upper pole left kidney is scarring based on 2007 CT . Reproductive:No pathologic findings. Stomach/Bowel: Inflammatory changes at the level of the distal descending and sigmoid colon are improved. This is again favored to reflect sigmoid diverticulitis, but mass lesion cannot be excluded, especially by noncontrast technique. There is new cluster of gas bubbles within the sigmoid colonic mesentery, central to the residual inflammation, with largest bubble measuring 7 mm. No drainable fluid collection. There is progressive stool retention above the sigmoid level. No small bowel distention or obstruction. No appendicitis. Vascular/Lymphatic: Distended venous structures. No acute vascular abnormality. No mass or adenopathy. Peritoneal: Small ascites. Musculoskeletal: No acute abnormalities. IMPRESSION: 1. Compared to 04/03/2015 sigmoid colon inflammation is improved but there is a new contained perforation into the sigmoid mesentery. No drainable fluid collection. 2. Stool retention above the thickened sigmoid colon. 3. Volume overload. Electronically Signed   By: Monte Fantasia M.D.   On: 05/02/2015 07:41   Dg Chest 2 View  05/01/2015  CLINICAL DATA:  Increasing shortness of breath and fever. EXAM: CHEST  2 VIEW 3:18 p.m. COMPARISON:  Radiographs dated 05/01/2015 at 11:17 a.m. and 02/14/2015 FINDINGS: There is chronic cardiomegaly with new pulmonary vascular congestion with small right and  tiny left pleural effusions. The patient has been demonstrate to have the pericardial effusion in the past year on no acute bone abnormality. Pacemaker in place. IMPRESSION: Findings consistent with congestive heart failure. Electronically Signed   By: Lorriane Shire M.D.   On: 05/01/2015 15:26   Dg Abd Acute W/chest  05/01/2015  CLINICAL DATA:  diverticulitis. Pt states that she has not had a fever in the past week but has recently noticed that her temperature rises to 99 in the evenings. Triage temperature: 98.5 F. Pt reports 5-6 small BMs daily. She denies abdominal pain at this time. Pt states that her GI physician is considering a colon resection. Pt had a ptinr done 2 days ago that was 5.74. SOB: Pt reports mild SOB, more than usual. O2 level ranges from 88-92. Triage SpO2: 90. Pt states that she was unable to walk upstairs last night due to SOB. EXAM: DG ABDOMEN ACUTE W/ 1V CHEST COMPARISON:  CT 04/03/2015 and previous studies FINDINGS: Moderate cardiomegaly as before. Stable right subclavian transvenous pacemaker. Chronic blunting of the left laterals costophrenic angle. Central pulmonary vascular congestion. Atheromatous aorta. No free air. Normal bowel gas pattern. Patchy aortoiliac arterial calcifications. Surgical clips in the right upper abdomen and right pelvis. Mild degenerative changes in the lumbar spine IMPRESSION: Stable cardiomegaly Negative abdominal radiographs. Electronically Signed   By: Lucrezia Europe M.D.   On: 05/01/2015 12:56    ECG  ventricularly paced rhythm  ASSESSMENT AND PLAN  1. Acute on chronic diastolic heart failure  - After discussing with the patient, it appears she has not been compliant with sodium and fluid restriction.  - Will continue IV diuresis as this time as long as her renal function is able to tolerate. Her lung is essentially clear on physical exam, only edema is in the legs. She will benefit from compression elastic ACE dressing and a later TED hose.  -  Albumin  on arrival was 3.3 which was low but not severely low.  2. Recent diverticulitis with new contained perforation into the sigmoid mesentery  3. chronic afib on coumadin  - CHA2DS2-Vasc score 7 (CVA, HF, female, age, HTN)  - rate controlled on exam.   4. h/o tachybrady s/p Medtronic PPM 5. pericardial effusion s/p pericardial window December 2016 6. HTN 7. history of GI bleed 8. CVA   Signed, Woodward Ku 05/06/2015, 4:37 PM

## 2015-05-06 NOTE — Evaluation (Signed)
Occupational Therapy Evaluation Patient Details Name: [name] MRN: [id number] DOB: August 20, 1923 Today's Date: 05/06/2015    History of Present Illness 82 y.o. female presenting with shortness of breath and fever. PMH is significant for atrial fibrillation, hx of CVA, hypertension, type 2 diabetes, tachycardia-bradycardia syndrome s/p pacemaker, prior pericardial effusion s/p pericardial window 12/16, sleep apnea (no CPAP), stroke (right frontal 2013), GI bleeding with hemorrhoids, and arthritis and recurrent diverticulitis.   Clinical Impression   PT admitted with SOB and fever. Pt currently with functional limitiations due to the deficits listed below (see OT problem list). PTA living at home alone and mod i with all adls.Pt with multiple recent admissions.  Pt will benefit from skilled OT to increase their independence and safety with adls and balance to allow discharge [place]. Recommend family to (A) at d/c and if not available recommend SNF. Pt is fall risk at this time alone at home.      Follow Up Recommendations  Home health OT;Supervision/Assistance - 24 hour    Equipment Recommendations  3 in 1 bedside comode    Recommendations for Other Services       Precautions / Restrictions Precautions Precautions: Fall Restrictions Weight Bearing Restrictions: No      Mobility Bed Mobility               General bed mobility comments: on bcs on arrival  Transfers Overall transfer level: Needs assistance Equipment used: Rolling walker (2 wheeled) Transfers: Sit to/from [facility] Sit to Stand: Supervision Stand pivot transfers: Min assist       General transfer comment: requires cues for safety. Pt reaching for unsafe environmental supports    Balance Overall balance assessment: Needs assistance         Standing balance support: Single extremity supported;During functional activity Standing balance-Leahy Scale: Poor                               ADL Overall ADL's : Needs assistance/impaired Eating/Feeding: Independent   Grooming: Wash/dry hands;Wash/dry face;Brushing hair;Supervision/safety;Sitting Grooming Details (indicate cue type and reason): requires seated position for adls this session Upper Body Bathing: Supervision/ safety;Sitting Upper Body Bathing Details (indicate cue type and reason): requires seated position Lower Body Bathing: Min guard;Sit to/from stand Lower Body Bathing Details (indicate cue type and reason): requires use of UE for support with static standing. Poor hygiene noted with stool on legs and socks and lack of awareness. pt attempting to leave tissue on skin and Ot educated that is not good for the skin. pt provided pads to allow frequent changes Upper Body Dressing : Supervision/safety;Sitting   Lower Body Dressing: Min guard;Sit to/from stand Lower Body Dressing Details (indicate cue type and reason): needed encouragement to doff socks and don new ones. Pt noted to have edema BIL LE Toilet Transfer: Minimal assistance;Stand-pivot;BSC;RW Toilet Transfer Details (indicate cue type and reason): unsafe use of rw. using environmental supports instead of RW.  Toileting- Clothing Manipulation and Hygiene: Minimal assistance;Sit to/from stand Toileting - Clothing Manipulation Details (indicate cue type and reason): voiding blood noted.      Functional mobility during ADLs: Minimal assistance;Rolling walker General ADL Comments: pt abandoning RW. pt needed cues to apply deordorant.     Vision Vision Assessment?: No apparent visual deficits   Perception     Praxis      Pertinent Vitals/Pain Pain Assessment: No/denies pain     Hand Dominance Right  Extremity/Trunk Assessment Upper Extremity Assessment Upper Extremity Assessment: Generalized weakness   Lower Extremity Assessment Lower Extremity Assessment: Defer to PT evaluation   Cervical / Trunk Assessment Cervical /  Trunk Assessment: Kyphotic   Communication Communication Communication: HOH   Cognition Arousal/Alertness: Awake/alert Behavior During Therapy: WFL for tasks assessed/performed Overall Cognitive Status: Impaired/Different from baseline Area of Impairment: Awareness           Awareness: Anticipatory       General Comments       Exercises       Shoulder Instructions      Home Living Family/patient expects to be discharged to:: Private residence Living Arrangements: Alone Available Help at Discharge: Family;Available PRN/intermittently Type of Home: House Home Access: Stairs to enter [employer] of Steps: 2 Entrance Stairs-Rails: Left Home Layout: Two level Alternate Level Stairs-Number of Steps: 12 Alternate Level Stairs-Rails: Left Bathroom Shower/Tub: Teacher, early years/pre: Standard Bathroom Accessibility: Yes   Home Equipment: Walker - 2 wheels   Additional Comments: plans to go home and have daughter visit her per OT evaluation 05-18-22 / 17      Prior Functioning/Environment Level of Independence: Independent             OT Diagnosis: Generalized weakness;Acute pain;Cognitive deficits   OT Problem List: Decreased strength;Decreased activity tolerance;Impaired balance (sitting and/or standing);Decreased safety awareness;Decreased cognition;Decreased knowledge of use of DME or AE;Decreased knowledge of precautions;Cardiopulmonary status limiting activity   OT Treatment/Interventions: Self-care/ADL training;Therapeutic exercise;DME and/or AE instruction;Therapeutic activities;Patient/family education;Balance training;Cognitive remediation/compensation    OT Goals(Current goals can be found in the care plan section) Acute Rehab OT Goals Patient Stated Goal: Return to her home OT Goal Formulation: With patient Time For Goal Achievement: 05/20/15 Potential to Achieve Goals: Good  OT Frequency: Min 2X/week   Barriers to D/C: Decreased  caregiver support  lives alone and plans to return home with family PRN. pt needs more supervision for initial d/c home due to urgency to void.       Co-evaluation              End of Session Equipment Utilized During Treatment: Rolling walker;Gait belt Nurse Communication: Mobility status;Precautions  Activity Tolerance: Patient tolerated treatment well Patient left: in bed;with call bell/phone within reach;with nursing/sitter in room (RN in room providing medication)   Time: [id number] OT Time Calculation (min): 29 min Charges:  OT General Charges $OT Visit: 1 Procedure OT Evaluation $OT Eval Moderate Complexity: 1 Procedure OT Treatments $Self Care/Home Management : 8-22 mins G-Codes:    Parke Poisson B 2015/05/18, 8:59 AM  Jeri Modena   OTR/L Pager: [phone] Office: [phone] .

## 2015-05-06 NOTE — Progress Notes (Signed)
ANTICOAGULATION CONSULT NOTE - Follow Up Consult  Pharmacy Consult for Coumadin Indication: atrial fibrillation  Allergies  Allergen Reactions  . Iodinated Diagnostic Agents Hives    HIVES 15MIN S/P IV CONTRAST INJECTION,WILL NEED 13 HR PREP FOR FUTURE INJECTIONS, ok s/p 50mg  po benadryl//a.calhoun  . Anti-Inflammatory Enzyme [Nutritional Supplements]     Retains fluids and headaches  . Arthrotec [Diclofenac-Misoprostol] Other (See Comments)    unknown  . Biaxin [Clarithromycin] Other (See Comments)    unknown  . Plavix [Clopidogrel Bisulfate] Other (See Comments)    unknown  . Spironolactone     Hair loss    Patient Measurements: Height: 5\' 5"  (165.1 cm) Weight: 153 lb 6.4 oz (69.582 kg) (scale c) IBW/kg (Calculated) : 57   Vital Signs: Temp: 98.1 F (36.7 C) (04/10 1204) Temp Source: Oral (04/10 1204) BP: 115/47 mmHg (04/10 1204) Pulse Rate: 64 (04/10 1204)  Labs:  Recent Labs  05/04/15 0443 05/05/15 0553 05/05/15 1538 05/06/15 0331  HGB 9.3* 8.8*  --  9.2*  HCT 29.2* 28.6*  --  29.8*  PLT 465* 479*  --  445*  LABPROT 26.6* 24.8*  --  24.7*  INR 2.48* 2.27*  --  2.26*  CREATININE 1.40* 1.24* 1.22* 1.23*    Estimated Creatinine Clearance: 29.8 mL/min (by C-G formula based on Cr of 1.23).   Assessment:  82 yo F presents on 4/5 with SOB and fever. On coumadin 2.5mg  daily except 5mg  on Sun/Tues/Thurs PTA for Afib. Last dose is unknown per patient sometime this last week. INR on admit is 2.61 but was 5.74 on 4/3 so most likely holding since then. Hgb ok at 12.2, plts 484.  AC/Heme: Coumadin PTA for Afib.  INR 2.26. Hgb 9.2 up, Plt 445. Was previously having blood in stool from hemorrhoids. Monitor for elevated INR due to severe drug interaction with Flagyl. --PTA warfarin: warfarin 5 mg q Sun/Tues/Thurs, 2.5 mg all other days    Goal of Therapy:  INR 2-3 Monitor platelets by anticoagulation protocol: Yes   Plan:  Repeat Coumadin 3mg  po x 1 tonight Daily  INR F/u K+ replacement with large doses.   Freddy Kinne S. Alford Highland, [name], BCPS Clinical Staff Pharmacist Pager [phone]  [place], [place] 05/06/2015,1:06 PM

## 2015-05-06 NOTE — Progress Notes (Signed)
Family Medicine Teaching Service Daily Progress Note Intern Pager: [phone]  Patient name: [name] Medical record number: [id number] Date of birth: September 11, 1923 Age: 82 y.o. Gender: female  Primary Care Provider: [name], MD Consultants: Surgery  Code Status: full  Pt Overview and Major Events to Date:  04/05-admitted with CHF exacerbation.  ABX/Culture CTX 4/6>> Flagyl 4/6>>  Urine culture 4/6>>reincubated for better growth  Assessment and Plan: [name] is a 82 y.o. female presenting with shortness of breath and fever. PMH is significant for atrial fibrillation, hx of CVA, hypertension, type 2 diabetes, tachycardia-bradycardia syndrome s/p pacemaker, prior pericardial effusion s/p pericardial window 12/16, sleep apnea (no CPAP), stroke (right frontal 2013), GI bleeding with hemorrhoids, and arthritis and recurrent diverticulitis.  Dyspnea: improving. Likely 2/2 CHF exacerbation. Still with JVD, HJR and edema but improved. ECHO in 01/2015 with EF 0000000, no diastolic dysfunction. Mod RAE, LAE and PAPP of 38.Patient with JVD+, HJR and significant edema and minimal dyspnea arguing more of right heart involvement. Dry weight about 65 Kg - I/O: UOP 2.3L, net -1161/-3338. Weight 76>>69.5 kg. Dry weight ~65 kg - Continue IV lasix 80 mg Q8 - Continue metolazone 5 mg daily - hold home toresamide - Card consulted over the phone: not impressed with EKG given ventricular pacing in Afib - PT: no follow up  - OT: home OT and 3 in 1 commode - advised to be out of bed - Ambulate for oxygen requirment  Recurrent diverticulitis:  Improving. Afebrile here. Palpable mass over LLQ. CT abdomen 04/06 with new contained perforation into the sigmoid mesentery.  -Appreciate surg recs:   -PO Augmentin or continue current antibiotics given poor response to Cipro and flagyl prior admission. I will continue current antibiotics today for UTI -OP f/u with Dr. Dalbert Batman and Dr. Watt Climes for  scope  AKI: resolving.  Cr 1.4>>1.24>1.23. 1.0 on discharge on 03/16. Likely secondary to CHF (cardiorenal syndrome) - BMP this afternoon  Metabolic alkalosis/Hypokalemia/hypochloremia: improved. K 2.3>a lot of KCl>3.4 this morning. Mg 1.9. Bicarb 35>>34 and Cl 89>>89. Likely secondary to diuretics. Could also be due to low EABV. -Will give KCl as needed -BMP in the afternoon -Will discontinue metolazone if worse this afternoon  UTI: no dysuria but urgency. She is on diuretics as well. UA with many bacteria, few LE and positive for nitrite. Urine culture with Klebsiella 70K and citrobacter 60K. Both sesitive to CTX. -Antibiotics as above  Atrial fibrillation:  Digoxin level 0.8, normal today. INR therapeutic - Continue Verapamil and Digoxin  - Continue warfarin per pharmacy - Daily PT/INR  Anemia: Hgb 8.8, slowly trending down (baseline 11). MCV 84% argues against IDA. History of diverticulitis and hemorrhoids - monitor CBC  Back pain: history of chronic back pain, She says she was on Tylenol #3 as needed at home. Not listed on her home meds. Not responding to plain tylenol or tramadol -Oxycodone 2.5 mg q4h as needed pain  FEN/GI: heart healthy/saline lock  Prophylaxis: on warfarin for Afib  Disposition: pending diuresing.   Subjective:  No events overnight. Weight trending down. Denies shortness of breath, chest pain, abdominal pain. Edema improved.   Objective: Temp:  [97.9 F (36.6 C)-98.1 F (36.7 C)] 98.1 F (36.7 C) (04/10 0436) Pulse Rate:  [59-79] 62 (04/10 0436) Resp:  [18] 18 (04/10 0436) BP: (111-126)/(49-56) 111/55 mmHg (04/10 0436) SpO2:  [94 %-98 %] 94 % (04/10 0436) Weight:  [153 lb 6.4 oz (69.582 kg)] 153 lb 6.4 oz (69.582 kg) (04/10 0436)  Physical Exam: Gen: appears well, pleasant, sitting on the edge to the bed. Neck: supple, no LAD, JVD+, HJR+ CV: regular rate and rythm. S1 & S2 audible, 2/6 murmurs over right and left sternal border, 1+ pitting  edema, Lt>Rt baseline,  JVD+, HJR+ Resp: on room air,  no apparent work of breathing, no crackles .  GI: bowel sounds normal, no tenderness .   GU: no suprapubic tenderness Skin: no lesions Neuro: alert oriented without gross deficit  Laboratory:  Recent Labs Lab 05/04/15 0443 05/05/15 0553 05/06/15 0331  WBC 9.7 8.6 9.6  HGB 9.3* 8.8* 9.2*  HCT 29.2* 28.6* 29.8*  PLT 465* 479* 445*    Recent Labs Lab 05/01/15 1237  05/05/15 0553 05/05/15 1538 05/06/15 0331  NA 139  < > 138 136 137  K 5.2  < > 2.3* 2.9* 3.4*  CL 98  < > 89* 87* 89*  CO2 32*  < > 35* 36* 34*  BUN 28*  < > 28* 30* 28*  CREATININE 1.51*  < > 1.24* 1.22* 1.23*  CALCIUM 9.1  < > 8.6* 9.1 8.7*  PROT 6.7  --   --   --   --   BILITOT 0.7  --   --   --   --   ALKPHOS 118  --   --   --   --   ALT 22  --   --   --   --   AST 37*  --   --   --   --   GLUCOSE 109*  < > 100* 124* 121*  < > = values in this interval not displayed.  Imaging/Diagnostic Tests: No results found.  [name] Riding, MD 05/06/2015, 7:00 AM PGY-1, [place] Intern pager: [phone], text pages welcome

## 2015-05-06 NOTE — Progress Notes (Signed)
Physical Therapy Treatment Patient Details Name: [name] MRN: [id number] DOB: 1923/08/22 Today's Date: 05/06/2015    History of Present Illness 82 y.o. female presenting with shortness of breath and fever. PMH is significant for atrial fibrillation, hx of CVA, hypertension, type 2 diabetes, tachycardia-bradycardia syndrome s/p pacemaker, prior pericardial effusion s/p pericardial window 12/16, sleep apnea (no CPAP), stroke (right frontal 2013), GI bleeding with hemorrhoids, and arthritis and recurrent diverticulitis.    PT Comments    Pt moving fairly well with RW.  Reviewed use of RW when she goes home for safety.  Pt declining HHPT at this time and daughter states she can A pt as needed.  Follow Up Recommendations  No PT follow up     Equipment Recommendations  None recommended by PT    Recommendations for Other Services       Precautions / Restrictions Precautions Precautions: Fall Restrictions Weight Bearing Restrictions: No    Mobility  Bed Mobility Overal bed mobility: Modified Independent                Transfers   Equipment used: Rolling walker (2 wheeled) Transfers: Sit to/from Stand Sit to Stand: Supervision         General transfer comment: Impulsive and needing cues for safety  Ambulation/Gait Ambulation/Gait assistance: Supervision;Min guard Ambulation Distance (Feet): 200 Feet Assistive device: Rolling walker (2 wheeled) Gait Pattern/deviations: Step-through pattern     General Gait Details: Generally steady with RW.  amb in room without AD holding onto furniture. o2 92% on RA   Stairs            Wheelchair Mobility    Modified Rankin (Stroke Patients Only)       Balance Overall balance assessment: Needs assistance         Standing balance support: Single extremity supported Standing balance-Leahy Scale: Poor                      Cognition Arousal/Alertness: Awake/alert Behavior During Therapy: WFL  for tasks assessed/performed Overall Cognitive Status: Within Functional Limits for tasks assessed                      Exercises      General Comments General comments (skin integrity, edema, etc.): Daughter present and states she can help at home.      Pertinent Vitals/Pain Pain Assessment: No/denies pain    Home Living                      Prior Function            PT Goals (current goals can now be found in the care plan section) Acute Rehab PT Goals Patient Stated Goal: Return to her home PT Goal Formulation: With patient Time For Goal Achievement: 05/17/15 Potential to Achieve Goals: Good Progress towards PT goals: Progressing toward goals    Frequency  Min 3X/week    PT Plan Current plan remains appropriate    Co-evaluation             End of Session   Activity Tolerance: Patient tolerated treatment well Patient left: in chair;with call bell/phone within reach;with family/visitor present     Time: 1204-1221 PT Time Calculation (min) (ACUTE ONLY): 17 min  Charges:  $Gait Training: 8-22 mins                    G Codes:  [name] 05/06/2015, 1:07 PM

## 2015-05-06 NOTE — Consult Note (Signed)
   [facility] CM Inpatient Consult   05/06/2015  [name] [birth year] [id number] Referral received to assess for restart of [facility] care management services.    Met with the patient and daughter at bedside regarding the benefits of [facility] Care Management services. Explained that [place] [facility] is a covered benefit of Medicare insurance. Review information for [facility] Care Management and a folder was provided with contact information.  Patient states, "I hope I don't have to go through all of that stuff, I had the last time with all the calls and stuff."  Explained that [place] [facility]  The importance of post hospital follow up and that it does not interfere with or replace any services arranged by the inpatient care management staff.  Patient declined services with [place] [facility].  Patient did accept the brochure and contact information again.  States, "I probably have it at home but I'll take it again in case I change my mind."  For questions, please contact: Natividad Brood, RN BSN [facility] Liaison  [phone] business mobile phone Toll free office [phone]

## 2015-05-07 ENCOUNTER — Telehealth: Payer: Self-pay | Admitting: Cardiovascular Disease

## 2015-05-07 ENCOUNTER — Encounter (HOSPITAL_COMMUNITY): Payer: Self-pay | Admitting: Student

## 2015-05-07 DIAGNOSIS — I481 Persistent atrial fibrillation: Secondary | ICD-10-CM

## 2015-05-07 LAB — BASIC METABOLIC PANEL
Anion gap: 14 (ref 5–15)
BUN: 28 mg/dL — AB (ref 6–20)
CO2: 36 mmol/L — ABNORMAL HIGH (ref 22–32)
CREATININE: 1.21 mg/dL — AB (ref 0.44–1.00)
Calcium: 8.9 mg/dL (ref 8.9–10.3)
Chloride: 87 mmol/L — ABNORMAL LOW (ref 101–111)
GFR calc Af Amer: 44 mL/min — ABNORMAL LOW (ref >60)
GFR, EST NON AFRICAN AMERICAN: 38 mL/min — AB (ref >60)
GLUCOSE: 137 mg/dL — AB (ref 65–99)
Potassium: 3.3 mmol/L — ABNORMAL LOW (ref 3.5–5.1)
Sodium: 137 mmol/L (ref 135–145)

## 2015-05-07 LAB — OCCULT BLOOD X 1 CARD TO LAB, STOOL: FECAL OCCULT BLD: POSITIVE — AB

## 2015-05-07 LAB — PROTIME-INR
INR: 2.46 — ABNORMAL HIGH (ref 0.00–1.49)
PROTHROMBIN TIME: 26.4 s — AB (ref 11.6–15.2)

## 2015-05-07 MED ORDER — TORSEMIDE 20 MG PO TABS
50.0000 mg | ORAL_TABLET | Freq: Once | ORAL | Status: AC
  Administered 2015-05-07: 50 mg via ORAL
  Filled 2015-05-07: qty 3

## 2015-05-07 MED ORDER — TORSEMIDE 20 MG PO TABS
30.0000 mg | ORAL_TABLET | Freq: Two times a day (BID) | ORAL | Status: DC
  Administered 2015-05-07: 30 mg via ORAL
  Filled 2015-05-07: qty 2

## 2015-05-07 MED ORDER — WARFARIN SODIUM 3 MG PO TABS
3.0000 mg | ORAL_TABLET | Freq: Once | ORAL | Status: AC
  Administered 2015-05-07: 3 mg via ORAL
  Filled 2015-05-07: qty 1

## 2015-05-07 MED ORDER — METOLAZONE 2.5 MG PO TABS
2.5000 mg | ORAL_TABLET | Freq: Every day | ORAL | Status: DC
  Administered 2015-05-07 – 2015-05-08 (×2): 2.5 mg via ORAL
  Filled 2015-05-07 (×2): qty 1

## 2015-05-07 MED ORDER — TORSEMIDE 20 MG PO TABS
80.0000 mg | ORAL_TABLET | Freq: Every day | ORAL | Status: DC
  Administered 2015-05-08: 80 mg via ORAL
  Filled 2015-05-07: qty 4

## 2015-05-07 MED ORDER — TORSEMIDE 20 MG PO TABS: 30.0000 mg | ORAL_TABLET | Freq: Once | ORAL | Status: DC

## 2015-05-07 NOTE — Progress Notes (Signed)
Occupational Therapy Treatment Patient Details Name: [name] MRN: [id number] DOB: 1923-11-09 Today's Date: 05/07/2015    History of present illness 82 y.o. female presenting with shortness of breath and fever. PMH is significant for atrial fibrillation, hx of CVA, hypertension, type 2 diabetes, tachycardia-bradycardia syndrome s/p pacemaker, prior pericardial effusion s/p pericardial window 12/16, sleep apnea (no CPAP), stroke (right frontal 2013), GI bleeding with hemorrhoids, and arthritis and recurrent diverticulitis.   OT comments  Pt greatly limited by pain this session and advised to only transfer with staff. Pt expressed dislike for requiring help for all transfers. Pt educated on fall risk. Pt self reports falling "really hard" 2 months ago and having to sit on ice afterward due to pain. Pt reports she is not scared to fall.   Follow Up Recommendations  Home health OT;Supervision/Assistance - 24 hour (recommending SNF but declines)    Equipment Recommendations  3 in 1 bedside comode    Recommendations for Other Services      Precautions / Restrictions Precautions Precautions: Fall       Mobility Bed Mobility Overal bed mobility: Modified Independent             General bed mobility comments: incr time and effort . pt falling back onto bed surface due to pain with first attempt.   Transfers Overall transfer level: Needs assistance   Transfers: Sit to/from Stand Sit to Stand: Min guard         General transfer comment: pt states "dont touch let me do it"    Balance Overall balance assessment: Needs assistance         Standing balance support: Single extremity supported;During functional activity Standing balance-Leahy Scale: Poor Standing balance comment: Pt reaching for environmental supports. Pt reports at home I can use my falls                   ADL Overall ADL's : Needs assistance/impaired Eating/Feeding:  Independent Eating/Feeding Details (indicate cue type and reason): drinking buttermilk present in room from family Grooming: Wash/dry face;Min guard;Sitting Grooming Details (indicate cue type and reason): pt limited by severe back pain Upper Body Bathing: Min guard;Sitting Upper Body Bathing Details (indicate cue type and reason): prolonged time due to back pain and discomfort                         Functional mobility during ADLs: Minimal assistance General ADL Comments: pt refusing RW at this time even after education. pt insist on return home alone. Daughter [name] lives a mile away and sees her everyday however [name] has cancer and undergoing treatment per patient. Daughter [name] present today but has responsibility to take care of grandchildren and lives 20 minutes away. [name] also has CP so can only provide suprevision level care. Third daughter lives more than an hour away and works full time  as a Marine scientist. pt has no immediate care that can provide min (A) 24/7      Vision                     Perception     Praxis      Cognition   Behavior During Therapy: [facility] for tasks assessed/performed Overall Cognitive Status: Within Functional Limits for tasks assessed                       Extremity/Trunk Assessment  Exercises     Shoulder Instructions       General Comments      Pertinent Vitals/ Pain       Pain Assessment: No/denies pain  Home Living                                          Prior Functioning/Environment              Frequency Min 2X/week     Progress Toward Goals  OT Goals(current goals can now be found in the care plan section)  Progress towards OT goals: Not progressing toward goals - comment  Acute Rehab OT Goals Patient Stated Goal: Return to her home OT Goal Formulation: With patient Time For Goal Achievement: 05/20/15 Potential to Achieve Goals: Good ADL Goals Pt Will Perform  Grooming: with set-up;sitting Pt Will Perform Upper Body Bathing: with set-up;sitting Pt Will Perform Lower Body Bathing: with set-up;sit to/from stand Pt Will Transfer to Toilet: with set-up;bedside commode;stand pivot transfer Pt Will Perform Tub/Shower Transfer: Tub transfer;with min assist;shower seat;rolling walker;ambulating Additional ADL Goal #1: Pt will complete basic transfer supervision level using RW mod I  Plan Discharge plan remains appropriate    Co-evaluation                 End of Session Equipment Utilized During Treatment: Gait belt   Activity Tolerance Patient tolerated treatment well   Patient Left in bed;with call bell/phone within reach   Nurse Communication Mobility status;Precautions        Time: [id number] OT Time Calculation (min): 30 min  Charges: OT General Charges $OT Visit: 1 Procedure OT Treatments $Self Care/Home Management : 23-37 mins  Parke Poisson B 05/07/2015, 10:26 AM   Jeri Modena   OTR/L Pager: [phone] Office: [phone] .

## 2015-05-07 NOTE — Progress Notes (Signed)
SATURATION QUALIFICATIONS: (This note is used to comply with regulatory documentation for home oxygen)  Patient Saturations on Room Air at Rest = 87%  Patient Saturations on Room Air while Ambulating = 70%  Patient Saturations on 2 Liters of oxygen while Ambulating = 98%  Please briefly explain why patient needs home oxygen:  Pt qualifies for home O2 as she desatted to 70% while ambulating without O2.

## 2015-05-07 NOTE — Progress Notes (Signed)
ANTICOAGULATION CONSULT NOTE - Follow Up Consult  Pharmacy Consult for Coumadin Indication: atrial fibrillation  Allergies  Allergen Reactions  . Iodinated Diagnostic Agents Hives    HIVES 15MIN S/P IV CONTRAST INJECTION,WILL NEED 13 HR PREP FOR FUTURE INJECTIONS, ok s/p 50mg  po benadryl//a.calhoun  . Anti-Inflammatory Enzyme [Nutritional Supplements]     Retains fluids and headaches  . Arthrotec [Diclofenac-Misoprostol] Other (See Comments)    unknown  . Biaxin [Clarithromycin] Other (See Comments)    unknown  . Plavix [Clopidogrel Bisulfate] Other (See Comments)    unknown  . Spironolactone     Hair loss    Patient Measurements: Height: 5\' 5"  (165.1 cm) Weight: 148 lb 1.6 oz (67.178 kg) (scale c) IBW/kg (Calculated) : 57   Vital Signs: Temp: 98.2 F (36.8 C) (04/11 0351) Temp Source: Oral (04/11 0351) BP: 117/57 mmHg (04/11 0351) Pulse Rate: 69 (04/11 0351)  Labs:  Recent Labs  05/05/15 0553 05/05/15 1538 05/06/15 0331 05/07/15 0346  HGB 8.8*  --  9.2*  --   HCT 28.6*  --  29.8*  --   PLT 479*  --  445*  --   LABPROT 24.8*  --  24.7* 26.4*  INR 2.27*  --  2.26* 2.46*  CREATININE 1.24* 1.22* 1.23* 1.21*    Estimated Creatinine Clearance: 27.8 mL/min (by C-G formula based on Cr of 1.21).   Assessment:  82 yo F presents on 4/5 with SOB and fever. On coumadin 2.5mg  daily except 5mg  on Sun/Tues/Thurs PTA for Afib. Last dose is unknown per patient sometime this last week. INR on admit is 2.61 but was 5.74 on 4/3 so most likely holding since then. Hgb ok at 12.2, plts 484.  AC/Heme: Coumadin PTA for Afib.  INR 2.46. Hgb 9.2 up, Plt 445. Was previously having blood in stool from hemorrhoids. CHADS2VASC 7 --PTA warfarin: warfarin 5 mg q Sun/Tues/Thurs, 2.5 mg all other days. INR 2.61 on admit    Goal of Therapy:  INR 2-3 Monitor platelets by anticoagulation protocol: Yes   Plan:  Repeat Coumadin 3mg  po x 1 tonight Daily INR Watch for drug interaction with  Flagyl.   Karey Suthers S. Alford Highland, [name], BCPS Clinical Staff Pharmacist Pager [phone]  Eilene Ghazi Stillinger 05/07/2015,12:30 PM

## 2015-05-07 NOTE — Telephone Encounter (Signed)
Message routed to [facility] pool As of 4/11 1230pm, patient still in hospital

## 2015-05-07 NOTE — Telephone Encounter (Signed)
New Message  TCM   05/16/2015 p at 1:30p schedule with Ignacia Bayley.

## 2015-05-07 NOTE — Progress Notes (Signed)
CM talked to patient again about HHRN/ [place] services with family member at bedside, patient refused all Friend services at this time. Mindi Slicker [facility] [phone]

## 2015-05-07 NOTE — Telephone Encounter (Signed)
LMTCB

## 2015-05-07 NOTE — Progress Notes (Signed)
Hospital Problem List     Active Problems:   Atrial fibrillation (HCC)   CHF (congestive heart failure) (HCC)   SOB (shortness of breath)   Acute on chronic diastolic heart failure (HCC)   Perforation of intestine due to diverticulitis of gastrointestinal tract (HCC)   Chronic thoracic back pain   Acute on chronic congestive heart failure [facility])   Diverticulosis    Patient Profile:   Primary Cardiologist: Dr. Sallyanne Kuster  82 yo female w/ PMH of chronic afib (on coumadin), h/o tachybrady (s/p Medtronic PPM), pericardial effusion (s/p pericardial window placement 12/2014), HTN, history of GI bleed, CVA, and chronic diastolic heart failure who presented to [facility] on 05/01/2015 for subjective fever and SOB and found to be fluid overloaded.  Subjective   Reports her breathing has significantly improved. Still has 2+ on the left, 1+ on the right. Denies any chest pain or palpitations.  Inpatient Medications    . amoxicillin-clavulanate  1 tablet Oral Q12H  . Barium Sulfate  900 mL Oral UD  . digoxin  0.0625 mg Oral Once per day on Mon Tue Thu Fri  . gabapentin  100 mg Oral TID  . metolazone  2.5 mg Oral Daily  . potassium chloride  40 mEq Oral TID  . sodium chloride flush  3 mL Intravenous Q12H  . torsemide  50 mg Oral Once  . [START ON 05/08/2015] torsemide  80 mg Oral Daily  . verapamil  180 mg Oral QHS  . Warfarin - Pharmacist Dosing Inpatient   Does not apply q1800    Vital Signs    Filed Vitals:   05/06/15 0436 05/06/15 1204 05/06/15 1944 05/07/15 0351  BP: 111/55 115/47 122/59 117/57  Pulse: 62 64 65 69  Temp: 98.1 F (36.7 C) 98.1 F (36.7 C) 97.9 F (36.6 C) 98.2 F (36.8 C)  TempSrc: Oral Oral Oral Oral  Resp: 18 18 18    Height:      Weight: 153 lb 6.4 oz (69.582 kg)   148 lb 1.6 oz (67.178 kg)  SpO2: 94% 96% 94% 90%    Intake/Output Summary (Last 24 hours) at 05/07/15 1136 Last data filed at 05/07/15 0919  Gross per 24 hour  Intake   1195 ml  Output   2302  ml  Net  -1107 ml   Filed Weights   05/05/15 0414 05/06/15 0436 05/07/15 0351  Weight: 156 lb 1.6 oz (70.806 kg) 153 lb 6.4 oz (69.582 kg) 148 lb 1.6 oz (67.178 kg)    Physical Exam    General: Well developed, well nourished, female appearing in no acute distress. Head: Normocephalic, atraumatic.  Neck: Supple without bruits, JVD not elevated. Lungs:  Resp regular and unlabored, CTA without wheezing or rales. Heart: RRR, S1, S2, no S3, S4, 2/6 SEM at RUSB; no rub. Abdomen: Soft, non-tender, non-distended with normoactive bowel sounds. No hepatomegaly. No rebound/guarding. No obvious abdominal masses. Extremities: No clubbing, cyanosis, 2+ edema on the left, 1+ on the right. Distal pedal pulses are 2+ bilaterally. Neuro: Alert and oriented X 3. Moves all extremities spontaneously. Psych: Normal affect.  Labs    CBC  Recent Labs  05/05/15 0553 05/06/15 0331  WBC 8.6 9.6  HGB 8.8* 9.2*  HCT 28.6* 29.8*  MCV 84.9 84.7  PLT 479* [id number]*   Basic Metabolic Panel  Recent Labs  05/05/15 1050  05/06/15 0331 05/07/15 0346  NA  --   < > 137 137  K  --   < >  3.4* 3.3*  CL  --   < > 89* 87*  CO2  --   < > 34* 36*  GLUCOSE  --   < > 121* 137*  BUN  --   < > 28* 28*  CREATININE  --   < > 1.23* 1.21*  CALCIUM  --   < > 8.7* 8.9  MG 1.9  --   --   --   < > = values in this interval not displayed.   Telemetry    V-paced, HR in 60's. No atopic events.   ECG    No new tracings.   Cardiac Studies and Radiology    Ct Abdomen Pelvis Wo Contrast: 05/02/2015  CLINICAL DATA:  Left lower quadrant abdominal mass.  Diverticulitis EXAM: CT ABDOMEN AND PELVIS WITHOUT CONTRAST TECHNIQUE: Multidetector CT imaging of the abdomen and pelvis was performed following the standard protocol without IV contrast. COMPARISON:  04/03/2015 FINDINGS: Lower chest and abdominal wall: Marked cardiomegaly with right ventricular pacer leads. Large pericardial effusion, chronic and water density. New small and  layering pleural effusions, greater on the right. Basilar atelectasis. Anasarca Hepatobiliary: No focal liver abnormality.Cholecystectomy. No bile duct distention. Pancreas: Unremarkable. Spleen: Unremarkable. Adrenals/Urinary Tract: Negative adrenals. No hydronephrosis or stone. Sub cm -density lesions in the right kidney are likely hemorrhagic cysts. Smaller high-density foci in the left kidney are likely the same. Apparent 18 mm soft tissue density nodule exophytic from the upper pole left kidney is scarring based on 2007 CT . Reproductive:No pathologic findings. Stomach/Bowel: Inflammatory changes at the level of the distal descending and sigmoid colon are improved. This is again favored to reflect sigmoid diverticulitis, but mass lesion cannot be excluded, especially by noncontrast technique. There is new cluster of gas bubbles within the sigmoid colonic mesentery, central to the residual inflammation, with largest bubble measuring 7 mm. No drainable fluid collection. There is progressive stool retention above the sigmoid level. No small bowel distention or obstruction. No appendicitis. Vascular/Lymphatic: Distended venous structures. No acute vascular abnormality. No mass or adenopathy. Peritoneal: Small ascites. Musculoskeletal: No acute abnormalities. IMPRESSION: 1. Compared to 04/03/2015 sigmoid colon inflammation is improved but there is a new contained perforation into the sigmoid mesentery. No drainable fluid collection. 2. Stool retention above the thickened sigmoid colon. 3. Volume overload. Electronically Signed   By: Monte Fantasia M.D.   On: 05/02/2015 07:41   Dg Chest 2 View: 05/01/2015  CLINICAL DATA:  Increasing shortness of breath and fever. EXAM: CHEST  2 VIEW 3:18 p.m. COMPARISON:  Radiographs dated 05/01/2015 at 11:17 a.m. and 02/14/2015 FINDINGS: There is chronic cardiomegaly with new pulmonary vascular congestion with small right and tiny left pleural effusions. The patient has been  demonstrate to have the pericardial effusion in the past year on no acute bone abnormality. Pacemaker in place. IMPRESSION: Findings consistent with congestive heart failure. Electronically Signed   By: Lorriane Shire M.D.   On: 05/01/2015 15:26   Dg Abd Acute W/chest: 05/01/2015  CLINICAL DATA:  diverticulitis. Pt states that she has not had a fever in the past week but has recently noticed that her temperature rises to 99 in the evenings. Triage temperature: 98.5 F. Pt reports 5-6 small BMs daily. She denies abdominal pain at this time. Pt states that her GI physician is considering a colon resection. Pt had a ptinr done 2 days ago that was 5.74. SOB: Pt reports mild SOB, more than usual. O2 level ranges from 88-92. Triage SpO2: 90. Pt states that  she was unable to walk upstairs last night due to SOB. EXAM: DG ABDOMEN ACUTE W/ 1V CHEST COMPARISON:  CT 04/03/2015 and previous studies FINDINGS: Moderate cardiomegaly as before. Stable right subclavian transvenous pacemaker. Chronic blunting of the left laterals costophrenic angle. Central pulmonary vascular congestion. Atheromatous aorta. No free air. Normal bowel gas pattern. Patchy aortoiliac arterial calcifications. Surgical clips in the right upper abdomen and right pelvis. Mild degenerative changes in the lumbar spine IMPRESSION: Stable cardiomegaly Negative abdominal radiographs. Electronically Signed   By: Lucrezia Europe M.D.   On: 05/01/2015 12:56    Echocardiogram: 02/21/2015 Study Conclusions - Left ventricle: The cavity size was normal. Wall thickness was  increased in a pattern of mild LVH. Systolic function was normal.  The estimated ejection fraction was in the range of 55% to 60%.  Wall motion was normal; there were no regional wall motion  abnormalities. - Aortic valve: There was mild to moderate regurgitation. Valve  area (VTI): 2.37 cm^2. Valve area (Vmax): 2.29 cm^2. Valve area  (Vmean): 2.33 cm^2. - Mitral valve: Mildly to moderately  calcified annulus. Mildly  thickened leaflets . There was mild regurgitation. - Left atrium: The atrium was moderately dilated. - Right ventricle: The cavity size was mildly dilated. - Right atrium: The atrium was moderately dilated. - Pulmonary arteries: Systolic pressure was mildly increased. PA  peak pressure: 38 mm Hg (S). - Pericardium, extracardiac: A small pericardial effusion was  identified circumferential to the heart.  Assessment & Plan    1. Acute on chronic diastolic heart failure - patient reports non-compliance with sodium and fluid restriction at home. Educated on the importance of limiting her fluid intake to less than 2L per day. Says she was consuming more than this prior to admission. - Was on IV Lasix with a net output of -3.8L. Lungs are clear but she still has significant lower extremity edema, more prominent on the left. - Switched to PO Torsemide 80mg  daily (PTA dosing was 60mg  daily) and Metolazone 2.5mg  daily. Continue to monitor urine output in switching to PO diuretics. Will need close Cardiology follow-up as an outpatient to monitor kidney function and titrate diuretics as indicated at that time.  2. Chronic Atrial Fibrillation - CHA2DS2-Vasc score 7 (CVA, HF, female, age, HTN). Continue Coumadin for anticoagulation. - continue Digoxin and Verapamil for rate-control. Dig level at 0.8.  3. Recurrent Diverticulitis/ Contained Colonic Perforation into Sigmoid Mesentery - per admitting team  4. History of Tachybrady Syndrome - s/p Medtronic PPM in 12/2013  5. Pericardial effusion  - s/p pericardial window 12/2014  6. AKI - creatinine elevated to 1.51 on admission. Improved to 1.21 on 05/07/2015.   Signed, Erma Heritage , PA-C 11:36 AM 05/07/2015 Pager: [phone]

## 2015-05-07 NOTE — Progress Notes (Signed)
Family Medicine Teaching Service Daily Progress Note Intern Pager: [phone]  Patient name: [name] Medical record number: [id number] Date of birth: 02-23-1923 Age: 82 y.o. Gender: female  Primary Care Provider: [name], MD Consultants: Surgery  Code Status: full  Pt Overview and Major Events to Date:  04/05-admitted with CHF exacerbation.  ABX/Culture CTX 4/6>> Flagyl 4/6>>  Urine culture 4/6>>reincubated for better growth  Assessment and Plan: [name] is a 82 y.o. female presenting with shortness of breath and fever. PMH is significant for atrial fibrillation, hx of CVA, hypertension, type 2 diabetes, tachycardia-bradycardia syndrome s/p pacemaker, prior pericardial effusion s/p pericardial window 12/16, sleep apnea (no CPAP), stroke (right frontal 2013), GI bleeding with hemorrhoids, and arthritis and recurrent diverticulitis.  Dyspnea: improving. Likely 2/2 CHF exacerbation. Still with JVD, HJR and edema but improved. ECHO in 01/2015 with EF 0000000, no diastolic dysfunction. Mod RAE, LAE and PAPP of 38.Patient with JVD+, HJR and significant edema and minimal dyspnea arguing more of right heart involvement. Dry weight about 65 Kg - I/O: UOP 2L, net -0.5/-3.9. Weight 76>>67 kg. Dry weight ~65 kg - IV lasix 80 mg Q8 (4/7-4/10) - Switch to torsemide 80 mg daily - metolazone 5 mg daily (4/7-4/10) - Metolazone 2.5 mg daily - K-dur 40 mg three times a day today - Appreciate card recs. Follow further recs - BMP in the morning - PT: no follow up  - OT: home OT and 3 in 1 commode - TED hose - Ambulate for oxygen requirement today  Recurrent diverticulitis:  Improving. Afebrile here. Palpable mass over LLQ. CT abdomen 04/06 with new contained perforation into the sigmoid mesentery.  -Appreciate surg recs:  -CTX and flagyl 4/7-4/10 -Augmentin 500 twice a day  -OP f/u with Dr. Dalbert Batman and Dr. Watt Climes for scope  AKI: resolving.  Cr 1.4>>1.24>1.23>1.21. 1.0 on  discharge on 03/16. Likely secondary to CHF (cardiorenal syndrome) - BMP in the morning  Metabolic alkalosis/Hypokalemia/hypochloremia: improved. K 2.3>a lot of KCl>3.3. Mg 1.9. Bicarb 36 and Cl 89>>87. Likely secondary to diuretics. Could also be due to low EABV. -Will give KCl as needed - am BMP  UTI: no dysuria but urgency. She is on diuretics as well. UA with many bacteria, few LE and positive for nitrite. Urine culture with Klebsiella 70K and citrobacter 60K. Both sesitive to CTX. -s/p CTX 4/7-4/10  Atrial fibrillation:  Digoxin level 0.8, normal today. INR therapeutic - Continue Verapamil and Digoxin  - Continue warfarin per pharmacy - Daily PT/INR  Anemia: Hgb stabilized at 9.2, slowly trending down (baseline 11). MCV 84% argues against IDA. History of diverticulitis and hemorrhoids - monitor CBC  Back pain: history of chronic back pain, She says she was on Tylenol #3 as needed at home. Not listed on her home meds. Not responding to plain tylenol or tramadol -Oxycodone 2.5 mg q4h as needed pain  FEN/GI: heart healthy/saline lock  Prophylaxis: on warfarin for Afib  Disposition: pending diuresing. Discharge 4/12 if all go well  Subjective:  No events overnight. Weight trending down. Denies shortness of breath, chest pain, abdominal pain. Edema improved.   Objective: Temp:  [97.9 F (36.6 C)-98.2 F (36.8 C)] 98.2 F (36.8 C) (04/11 0351) Pulse Rate:  [64-69] 69 (04/11 0351) Resp:  [18] 18 (04/10 1944) BP: (115-122)/(47-59) 117/57 mmHg (04/11 0351) SpO2:  [90 %-96 %] 90 % (04/11 0351) Weight:  [148 lb 1.6 oz (67.178 kg)] 148 lb 1.6 oz (67.178 kg) (04/11 0351)  Physical Exam: Gen: appears  well, pleasant, sitting on the edge to the bed eating breakfast Neck: supple, no LAD CV: regular rate and rythm. S1 & S2 audible, 2/6 murmurs over right and left sternal border, 1+ pitting edema Resp: on room air,  no apparent work of breathing, no crackles .  GI: bowel sounds  normal, no tenderness .   GU: no suprapubic tenderness Skin: no lesions Neuro: alert oriented without gross deficit  Laboratory:  Recent Labs Lab 05/04/15 0443 05/05/15 0553 05/06/15 0331  WBC 9.7 8.6 9.6  HGB 9.3* 8.8* 9.2*  HCT 29.2* 28.6* 29.8*  PLT 465* 479* 445*    Recent Labs Lab 05/01/15 1237  05/05/15 1538 05/06/15 0331 05/07/15 0346  NA 139  < > 136 137 137  K 5.2  < > 2.9* 3.4* 3.3*  CL 98  < > 87* 89* 87*  CO2 32*  < > 36* 34* 36*  BUN 28*  < > 30* 28* 28*  CREATININE 1.51*  < > 1.22* 1.23* 1.21*  CALCIUM 9.1  < > 9.1 8.7* 8.9  PROT 6.7  --   --   --   --   BILITOT 0.7  --   --   --   --   ALKPHOS 118  --   --   --   --   ALT 22  --   --   --   --   AST 37*  --   --   --   --   GLUCOSE 109*  < > 124* 121* 137*  < > = values in this interval not displayed.  Imaging/Diagnostic Tests: No results found.  Mercy Riding, MD 05/07/2015, 7:10 AM PGY-1, [place] Intern pager: [phone], text pages welcome

## 2015-05-08 LAB — BASIC METABOLIC PANEL
ANION GAP: 12 (ref 5–15)
BUN: 26 mg/dL — ABNORMAL HIGH (ref 6–20)
CO2: 38 mmol/L — ABNORMAL HIGH (ref 22–32)
Calcium: 9 mg/dL (ref 8.9–10.3)
Chloride: 89 mmol/L — ABNORMAL LOW (ref 101–111)
Creatinine, Ser: 1.21 mg/dL — ABNORMAL HIGH (ref 0.44–1.00)
GFR, EST AFRICAN AMERICAN: 44 mL/min — AB (ref >60)
GFR, EST NON AFRICAN AMERICAN: 38 mL/min — AB (ref >60)
Glucose, Bld: 144 mg/dL — ABNORMAL HIGH (ref 65–99)
POTASSIUM: 3.5 mmol/L (ref 3.5–5.1)
SODIUM: 139 mmol/L (ref 135–145)

## 2015-05-08 LAB — PROTIME-INR
INR: 2.43 — AB (ref 0.00–1.49)
Prothrombin Time: 26.1 seconds — ABNORMAL HIGH (ref 11.6–15.2)

## 2015-05-08 MED ORDER — METOLAZONE 2.5 MG PO TABS: 2.5000 mg | ORAL_TABLET | Freq: Every day | ORAL | Status: DC

## 2015-05-08 MED ORDER — AMOXICILLIN-POT CLAVULANATE 500-125 MG PO TABS: 1.0000 | ORAL_TABLET | Freq: Two times a day (BID) | ORAL | Status: DC

## 2015-05-08 MED ORDER — TORSEMIDE 20 MG PO TABS: 80.0000 mg | ORAL_TABLET | Freq: Every day | ORAL | Status: DC

## 2015-05-08 NOTE — Progress Notes (Signed)
Physical Therapy Treatment Patient Details Name: [name] MRN: [id number] DOB: Sep 28, 1923 Today's Date: 05/08/2015    History of Present Illness 82 y.o. female presenting with shortness of breath and fever. PMH is significant for atrial fibrillation, hx of CVA, hypertension, type 2 diabetes, tachycardia-bradycardia syndrome s/p pacemaker, prior pericardial effusion s/p pericardial window 12/16, sleep apnea (no CPAP), stroke (right frontal 2013), GI bleeding with hemorrhoids, and arthritis and recurrent diverticulitis.    PT Comments    Patient seen for mobility progression. Patient ambulated in hall on room air and performed stair negotiation. Patient with modest instability, continues to required use of RW. Saturations at rest 89% on room air with upright activity improved to 92% on rooms air. Will continue to see and progress as tolerated.   Follow Up Recommendations  No PT follow up     Equipment Recommendations  None recommended by PT    Recommendations for Other Services       Precautions / Restrictions Precautions Precautions: Fall Precaution Comments: requires oxygen and lack of awareness    Mobility  Bed Mobility Overal bed mobility: Modified Independent                Transfers Overall transfer level: Needs assistance   Transfers: Sit to/from Stand Sit to Stand: Min guard         General transfer comment: Vcs for hand placement and safety, patient with increased back spasm pain when coming to upright  Ambulation/Gait Ambulation/Gait assistance: Supervision Ambulation Distance (Feet): 180 Feet Assistive device: Rolling walker (2 wheeled) Gait Pattern/deviations: Step-through pattern   Gait velocity interpretation: at or above normal speed for age/gender General Gait Details: modest instability, dependence on RW for safety at this time   Stairs Stairs: Yes Stairs assistance: Min guard Stair Management: One rail Right;Step to  pattern;Forwards Number of Stairs: 4 General stair comments: min guard for safety  Wheelchair Mobility    Modified Rankin (Stroke Patients Only)       Balance Overall balance assessment: Needs assistance   Sitting balance-Leahy Scale: Good     Standing balance support: Bilateral upper extremity supported Standing balance-Leahy Scale: Poor Standing balance comment: reliance on RW                    Cognition Arousal/Alertness: Awake/alert Behavior During Therapy: WFL for tasks assessed/performed Overall Cognitive Status: Impaired/Different from baseline Area of Impairment: Awareness           Awareness: Anticipatory   General Comments: Pt decr awareness of incontinence, no visual attention to incontinence, lack awareness to oxygen needs. pt reports "why do they keep saying I have to have that? i feel fine"    Exercises      General Comments        Pertinent Vitals/Pain Pain Assessment: Faces Faces Pain Scale: Hurts even more Pain Location: back during transitional movements Pain Descriptors / Indicators: Spasm Pain Intervention(s): Patient requesting pain meds-RN notified;Limited activity within patient's tolerance;Monitored during session    Home Living                      Prior Function            PT Goals (current goals can now be found in the care plan section) Acute Rehab PT Goals Patient Stated Goal: Return to her home PT Goal Formulation: With patient Time For Goal Achievement: 05/17/15 Potential to Achieve Goals: Good Progress towards PT goals: Progressing toward goals  Frequency  Min 3X/week    PT Plan Current plan remains appropriate    Co-evaluation             End of Session Equipment Utilized During Treatment: Gait belt;Oxygen Activity Tolerance: Patient tolerated treatment well Patient left: in bed;with call bell/phone within reach;with bed alarm set     Time: [id number] PT Time Calculation (min)  (ACUTE ONLY): 18 min  Charges:  $Gait Training: 8-22 mins                    G CodesDuncan Dull 2015/05/31, 9:54 AM Alben Deeds, PT DPT  [phone]

## 2015-05-08 NOTE — Progress Notes (Signed)
Occupational Therapy Treatment Patient Details Name: [name] MRN: [id number] DOB: 1923-09-23 Today's Date: 05/08/2015    History of present illness 82 y.o. female presenting with shortness of breath and fever. PMH is significant for atrial fibrillation, hx of CVA, hypertension, type 2 diabetes, tachycardia-bradycardia syndrome s/p pacemaker, prior pericardial effusion s/p pericardial window 12/16, sleep apnea (no CPAP), stroke (right frontal 2013), GI bleeding with hemorrhoids, and arthritis and recurrent diverticulitis.   OT comments  Pt with decr oxygen to 85% on RA with toilet transfer simulating couch to bathroom home distance. Pt unawareness to stool on foot and leg this session. Pt has 24 (A) for today only upon d/c home. Pt high fall risk due to decr oxygen lack of awareness and lack of A upon d/c. Pt states "i like to be alone".   Pt agreeable at the end of session to home health RN. CM Brenda notified    Follow Up Recommendations  Home health OT;Supervision/Assistance - 24 hour    Equipment Recommendations  3 in 1 bedside comode    Recommendations for Other Services      Precautions / Restrictions Precautions Precautions: Fall Precaution Comments: requires oxygen and lack of awareness       Mobility Bed Mobility Overal bed mobility: Modified Independent                Transfers Overall transfer level: Needs assistance   Transfers: Sit to/from Stand Sit to Stand: Min guard         General transfer comment: incr time and decr speed    Balance Overall balance assessment: Needs assistance         Standing balance support: Single extremity supported;During functional activity Standing balance-Leahy Scale: Poor                     ADL Overall ADL's : Needs assistance/impaired Eating/Feeding: Independent   Grooming: Wash/dry hands;Oral care;Min guard;Standing Grooming Details (indicate cue type and reason): heavy leaning on sink  surface. pt reports pain immediately following transfer     Lower Body Bathing: Min guard;Sit to/from stand Lower Body Bathing Details (indicate cue type and reason): pt washing with wash cloth. pt washing posterior to anterior with incr risk of infection.          Toilet Transfer: Min guard;Ambulation;Regular Toilet;Grab bars Toilet Transfer Details (indicate cue type and reason): requires use of grab bar for sit<> stand Toileting- Clothing Manipulation and Hygiene: Minimal assistance;Sit to/from stand Toileting - Clothing Manipulation Details (indicate cue type and reason): pt with incontinence in standing to pull up brief. pt with stool on sock and unaware. pt returnt o supine with stool on LE     Functional mobility during ADLs: Min guard General ADL Comments: pt encouraged to use RW and pt completely declines. pt walking with arms adducted to sides and very slow decr gait. pt fatigued and reports need for pain medication after activity. pt states "i have something at home that will do the trick if they dont give me something here" Pt plans to d/c home with daughter [name] today and have grandson present this evening. pt plans to stay at home "ALONE". Pt agreeable at end of session to have home health nurse come visit after education provided. Pt reports "i just dont want them to see my house its messy I dont have closets. Its a 80 year old house" Pt educated that the home health services is to provide direct care to her and to  make sure she is health to remain at home. Pt agreeable. CM Hassan Rowan informed of patients request to now have services.       Vision                     Perception     Praxis      Cognition   Behavior During Therapy: [facility] for tasks assessed/performed Overall Cognitive Status: Impaired/Different from baseline Area of Impairment: Awareness            Awareness: Anticipatory   General Comments: Pt decr awareness of incontinence, no visual attention  to incontinence, lack awareness to oxygen needs. pt reports "why do they keep saying I have to have that? i feel fine"    Extremity/Trunk Assessment               Exercises     Shoulder Instructions       General Comments      Pertinent Vitals/ Pain       Pain Assessment: No/denies pain  Home Living                                          Prior Functioning/Environment              Frequency Min 2X/week     Progress Toward Goals  OT Goals(current goals can now be found in the care plan section)  Progress towards OT goals: Progressing toward goals  Acute Rehab OT Goals Patient Stated Goal: Return to her home OT Goal Formulation: With patient Time For Goal Achievement: 05/20/15 Potential to Achieve Goals: Good ADL Goals Pt Will Perform Grooming: with set-up;sitting Pt Will Perform Upper Body Bathing: with set-up;sitting Pt Will Perform Lower Body Bathing: with set-up;sit to/from stand Pt Will Transfer to Toilet: with set-up;bedside commode;stand pivot transfer Pt Will Perform Tub/Shower Transfer: Tub transfer;with min assist;shower seat;rolling walker;ambulating Additional ADL Goal #1: Pt will complete basic transfer supervision level using RW mod I  Plan Discharge plan remains appropriate    Co-evaluation                 End of Session Equipment Utilized During Treatment:  (declined oxygen)   Activity Tolerance Patient tolerated treatment well   Patient Left in bed;with call bell/phone within reach   Nurse Communication Mobility status;Precautions        Time: [id number] OT Time Calculation (min): 29 min  Charges: OT General Charges $OT Visit: 1 Procedure OT Treatments $Self Care/Home Management : 23-37 mins  Parke Poisson B 05/08/2015, 9:19 AM   Jeri Modena   OTR/L PagerIP:[phone] Office: [phone] .

## 2015-05-08 NOTE — Progress Notes (Signed)
Patient has decided to have a HHRN at discharge, patient chose [facility] ( [facility]); Referall given as requested, [place] ( [facility] ) Home oxygen to be delivered to patient prior to discharging home; Of note, patient was ordered home oxygen a few weeks ago but she refused to have it at that time per [facility] with [facility]. B Gwynneth Fabio RN,MHA.BSN [phone]

## 2015-05-08 NOTE — Progress Notes (Signed)
Family Medicine Teaching Service Daily Progress Note Intern Pager: [phone]  Patient name: [name] Medical record number: [id number] Date of birth: 1924/01/04 Age: 82 y.o. Gender: female  Primary Care Provider: [name], MD Consultants: Surgery  Code Status: full  Pt Overview and Major Events to Date:  04/05-admitted with CHF exacerbation.  ABX/Culture CTX 4/6>>4/10 Flagyl 4/6>>4/10 Augmentin 4/11-4/15  Urine culture 4/6>>reincubated for better growth  Assessment and Plan: [name] is a 82 y.o. female presenting with shortness of breath and fever. PMH is significant for atrial fibrillation, hx of CVA, hypertension, type 2 diabetes, tachycardia-bradycardia syndrome s/p pacemaker, prior pericardial effusion s/p pericardial window 12/16, sleep apnea (no CPAP), stroke (right frontal 2013), GI bleeding with hemorrhoids, and arthritis and recurrent diverticulitis.  Dyspnea: improved. Likely 2/2 CHF exacerbation. ECHO in 01/2015 with EF 0000000, no diastolic dysfunction. Mod RAE, LAE and PAPP of 38.Transitioned to oral diuretics yesterday. Weight down 3lbs. K 3.5. Cr 1.21. - I/O: unreliable but net negative. Weight 76>>66 kg. Dry weight ~65 kg - IV lasix 80 mg Q8 (4/7-4/10) - Torsemide 80 mg daily (4/11>>) - metolazone 5 mg daily (4/7-4/10) - Metolazone 2.5 mg daily (4/11>>)  - K-dur 40 mg three times a day - Appreciate card recs. Follow further recs - PT: no follow up  - OT: home OT and 3 in 1 commode - Desated to 70% on RA with ambulation. Home oxygen ordered  Recurrent diverticulitis:  Improving. Afebrile here. Palpable mass over LLQ. CT abdomen 04/06 with new contained perforation into the sigmoid mesentery.  -Appreciate surg recs:  -CTX and flagyl 4/7-4/10 -Augmentin 500 twice a day (4/11-4/15) -OP f/u with Dr. Dalbert Batman and Dr. Watt Climes for scope  AKI: resolving.  Cr 1.4>>1.24>1.23>1.21. 1.0 on discharge on 03/16. Likely secondary to CHF (cardiorenal  syndrome)  Metabolic alkalosis/Hypokalemia/hypochloremia: improved. K 2.3>a lot of KCl>3.5. Mg 1.9. Bicarb 36 and Cl 89. Likely secondary to diuretics. Could also be due to low EABV. -Will discharge on KCl  UTI: no dysuria but urgency. She is on diuretics as well. UA with many bacteria, few LE and positive for nitrite. Urine culture with Klebsiella 70K and citrobacter 60K. Both sesitive to CTX. -s/p CTX 4/7-4/10  Atrial fibrillation:  Digoxin level 0.8, normal today. INR therapeutic - Continue Verapamil and Digoxin  - Continue warfarin per pharmacy - Daily PT/INR  Anemia: Hgb stabilized at 9.2, slowly trending down (baseline 11). MCV 84% argues against IDA. History of diverticulitis and hemorrhoids - monitor CBC  Back pain: history of chronic back pain, She says she was on Tylenol #3 as needed at home. Not listed on her home meds. Not responding to plain tylenol or tramadol -Oxycodone 2.5 mg q4h as needed pain  FEN/GI: heart healthy/saline lock  Prophylaxis: on warfarin for Afib  Disposition: discharge today on diuretics, K-dur and augmentin  Subjective:  No events overnight. Weight trending down. Denies shortness of breath, chest pain, abdominal pain. Edema improved.   Objective: Temp:  [98.1 F (36.7 C)-98.6 F (37 C)] 98.6 F (37 C) (04/12 0447) Pulse Rate:  [60-68] 68 (04/12 0447) Resp:  [18-20] 20 (04/12 0447) BP: (111-132)/(53-72) 118/72 mmHg (04/12 0447) SpO2:  [91 %-99 %] 97 % (04/12 0447) Weight:  [145 lb 8 oz (65.998 kg)] 145 lb 8 oz (65.998 kg) (04/12 0447)  Physical Exam: Gen: appears well, pleasant, sitting on the edge to the bed eating breakfast Neck: supple, no LAD CV: regular rate and rythm. S1 & S2 audible, 2/6 murmurs over right  and left sternal border, 1+ pitting edema Resp: on room air,  no apparent work of breathing, no crackles .  GI: bowel sounds normal, no tenderness .   GU: no suprapubic tenderness Skin: no lesions Neuro: alert oriented without  gross deficit  Laboratory:  Recent Labs Lab 05/04/15 0443 05/05/15 0553 05/06/15 0331  WBC 9.7 8.6 9.6  HGB 9.3* 8.8* 9.2*  HCT 29.2* 28.6* 29.8*  PLT 465* 479* 445*    Recent Labs Lab 05/01/15 1237  05/05/15 1538 05/06/15 0331 05/07/15 0346  NA 139  < > 136 137 137  K 5.2  < > 2.9* 3.4* 3.3*  CL 98  < > 87* 89* 87*  CO2 32*  < > 36* 34* 36*  BUN 28*  < > 30* 28* 28*  CREATININE 1.51*  < > 1.22* 1.23* 1.21*  CALCIUM 9.1  < > 9.1 8.7* 8.9  PROT 6.7  --   --   --   --   BILITOT 0.7  --   --   --   --   ALKPHOS 118  --   --   --   --   ALT 22  --   --   --   --   AST 37*  --   --   --   --   GLUCOSE 109*  < > 124* 121* 137*  < > = values in this interval not displayed.  Imaging/Diagnostic Tests: No results found.  Mercy Riding, MD 05/08/2015, 7:26 AM PGY-1, [place] Intern pager: [phone], text pages welcome

## 2015-05-08 NOTE — Progress Notes (Signed)
Orders received for pt discharge.  Discharge summary printed and reviewed with pt.  Explained medication regimen, and pt had no further questions at this time.  IV removed and site remains clean, dry, intact.  Telemetry removed.  Pt in stable condition and awaiting transport. 

## 2015-05-08 NOTE — Progress Notes (Signed)
[name] 11:45 AM  Subjective: Patient had called our office to let us know of the admission and she looks good today without any abdominal pain and her CT and labs were reviewed and she says she's going home soon and we rediscussed her possible flexible sigmoidoscopy coming up and her primary care doctor's concerns about something more significant than just recurrent diverticulitis going on  Objective: Vital signs stable afebrile no acute distress abdomen is soft nontender much improved since my last exam labs and CT reviewed  Assessment: Diverticulitis  Plan: I asked her and her daughter to follow up with me when necessary or the end of this month and she will discuss her case with her primary care physician and we will decide if we need to proceed with a flexible sigmoidoscopy at that time  [facility]  Pager [phone] After 5PM or if no answer call [phone]

## 2015-05-08 NOTE — Telephone Encounter (Signed)
Patient contacted regarding discharge from [facility] on 05/08/2015.  Patient understands to follow up with provider Gerrianne Scale, PA on Monday May 1 at 1045am at [facility]. Patient understands discharge instructions? YES Patient understands medications and regiment? YES Patient understands to bring all medications to this visit? YES

## 2015-05-11 ENCOUNTER — Ambulatory Visit (INDEPENDENT_AMBULATORY_CARE_PROVIDER_SITE_OTHER): Payer: Medicare Other | Admitting: Emergency Medicine

## 2015-05-11 ENCOUNTER — Encounter: Payer: Self-pay | Admitting: Emergency Medicine

## 2015-05-11 VITALS — BP 106/40 | HR 77 | Temp 97.8°F | Resp 17 | Ht 65.0 in | Wt 137.5 lb

## 2015-05-11 DIAGNOSIS — I482 Chronic atrial fibrillation, unspecified: Secondary | ICD-10-CM

## 2015-05-11 DIAGNOSIS — I6310 Cerebral infarction due to embolism of unspecified precerebral artery: Secondary | ICD-10-CM | POA: Diagnosis not present

## 2015-05-11 DIAGNOSIS — R252 Cramp and spasm: Secondary | ICD-10-CM | POA: Diagnosis not present

## 2015-05-11 DIAGNOSIS — K5732 Diverticulitis of large intestine without perforation or abscess without bleeding: Secondary | ICD-10-CM | POA: Diagnosis not present

## 2015-05-11 DIAGNOSIS — R1904 Left lower quadrant abdominal swelling, mass and lump: Secondary | ICD-10-CM

## 2015-05-11 DIAGNOSIS — R1030 Lower abdominal pain, unspecified: Secondary | ICD-10-CM | POA: Diagnosis not present

## 2015-05-11 DIAGNOSIS — E876 Hypokalemia: Secondary | ICD-10-CM

## 2015-05-11 LAB — POCT CBC
Granulocyte percent: 82.1 %G — AB (ref 37–80)
HEMATOCRIT: 32.5 % — AB (ref 37.7–47.9)
Hemoglobin: 11 g/dL — AB (ref 12.2–16.2)
LYMPH, POC: 1.7 (ref 0.6–3.4)
MCH, POC: 27.1 pg (ref 27–31.2)
MCHC: 34 g/dL (ref 31.8–35.4)
MCV: 79.9 fL — AB (ref 80–97)
MID (cbc): 0.8 (ref 0–0.9)
MPV: 7.2 fL (ref 0–99.8)
POC GRANULOCYTE: 11.8 — AB (ref 2–6.9)
POC LYMPH %: 12 % (ref 10–50)
POC MID %: 5.9 % (ref 0–12)
Platelet Count, POC: 402 10*3/uL (ref 142–424)
RBC: 4.07 M/uL (ref 4.04–5.48)
RDW, POC: 14.8 %
WBC: 14.4 10*3/uL — AB (ref 4.6–10.2)

## 2015-05-11 LAB — BASIC METABOLIC PANEL WITH GFR
BUN: 34 mg/dL — ABNORMAL HIGH (ref 7–25)
CALCIUM: 9.4 mg/dL (ref 8.6–10.4)
CO2: 43 mmol/L — ABNORMAL HIGH (ref 20–31)
Chloride: 80 mmol/L — ABNORMAL LOW (ref 98–110)
Creat: 1.39 mg/dL — ABNORMAL HIGH (ref 0.60–0.88)
GFR, EST AFRICAN AMERICAN: 38 mL/min — AB (ref >=60)
GFR, EST NON AFRICAN AMERICAN: 33 mL/min — AB (ref >=60)
Glucose, Bld: 163 mg/dL — ABNORMAL HIGH (ref 65–99)
Potassium: 3.5 mmol/L (ref 3.5–5.3)
SODIUM: 137 mmol/L (ref 135–146)

## 2015-05-11 LAB — MAGNESIUM: MAGNESIUM: 1.7 mg/dL (ref 1.5–2.5)

## 2015-05-11 MED ORDER — AMOXICILLIN-POT CLAVULANATE 500-125 MG PO TABS: 1.0000 | ORAL_TABLET | Freq: Two times a day (BID) | ORAL | Status: DC

## 2015-05-11 MED ORDER — OXYCODONE HCL 5 MG PO TABS: ORAL_TABLET | ORAL | Status: DC

## 2015-05-11 NOTE — Progress Notes (Signed)
By signing my name below, I, Raven Small, attest that this documentation has been prepared under the direction and in the presence of [name], MD.  Electronically Signed: Thea Alken, ED Scribe. 05/06/2015. 12:17 PM.   Chief Complaint:  Chief Complaint  Patient presents with  . Hospitalization Follow-up    was treated for diverticulitis & fluid around the lungs    HPI: [name] is a 82 y.o. female who reports to [facility] today for hospitalization follow up. Pt was hospitalized for 7 days for diverticulitis. This is her 3rd time in the past 3 months being hospitalized for diverticulitis. Pt states she still has some abdominal soreness. She is able to eat a little and reports eating a biscuit this morning. She denies emesis,  Pt also complains of her muscles "jumping". Pt believes she has a low potassium. Her potassium level 3 days ago was 3.5 but was 2.3 6 days ago.    Past Medical History  Diagnosis Date  . Pericardial effusion     a. s/p pericardial window 01/16/15  . History of GI bleed     a. secondary to AVM's. Treated with Fe infusion  . HTN (hypertension)   . Obesity   . LVH (left ventricular hypertrophy)   . Sleep apnea     mild-no cpap  . Positive TB test   . Anemia   . Stroke [facility])     a. 2013: right frontal  . Arthritis   . Tachycardia-bradycardia syndrome [facility])     a. s/p [employer] [place], model number [id number], serial number [id number] H 12/2013  . History of shingles   . Chronic diastolic (congestive) heart failure ([place])     a. Echo 01/2015 with EF of 55-60%.  . Chronic atrial fibrillation (HCC)     a. on Coumadin, Digoxin, and Verapamil   Past Surgical History  Procedure Laterality Date  . Breast lumpectomy Bilateral     negative for cancer  . Esophagogastroduodenoscopy  05/19/2011    Procedure: ESOPHAGOGASTRODUODENOSCOPY (EGD);  Surgeon: Lear Ng, MD;  Location: [facility] ENDOSCOPY;  Service: Endoscopy;  Laterality: N/A;  doctor aware of inr    will try to be here no later than 230  . Cholecystectomy  2005  . Esophagogastroduodenoscopy  06/22/2011    Procedure: ESOPHAGOGASTRODUODENOSCOPY (EGD);  Surgeon: Arta Silence, MD;  Location: [facility] ENDOSCOPY;  Service: Endoscopy;  Laterality: N/A;  Check PT/INR in am  . Givens capsule study  06/23/2011    Procedure: GIVENS CAPSULE STUDY;  Surgeon: Arta Silence, MD;  Location: [facility] ENDOSCOPY;  Service: Endoscopy;  Laterality: N/A;  . Colonoscopy  08/11/2011    Procedure: COLONOSCOPY;  Surgeon: Jeryl [place], MD;  Location: [facility] ENDOSCOPY;  Service: Endoscopy;  Laterality: N/A;  . Hot hemostasis  08/11/2011    Procedure: HOT HEMOSTASIS (ARGON PLASMA COAGULATION/BICAP);  Surgeon: Jeryl [place], MD;  Location: [facility] ENDOSCOPY;  Service: Endoscopy;  Laterality: N/A;  . Mass excision Left 09/14/2013    Procedure: EXCISION MASS LEFT WRIST;  Surgeon: Leanora Cover, MD;  Location: [place];  Service: Orthopedics;  Laterality: Left;  . Tonsillectomy and adenoidectomy  1950  . Insert / replace / remove pacemaker  2001    Last generator in 2006; interrogated Dec 2012  . Cataract extraction w/ intraocular lens  implant, bilateral Bilateral   . Lipoma excision Left 07/2013    wrist  . Pacemaker generator change N/A 01/02/2014    Procedure: PACEMAKER GENERATOR CHANGE;  Surgeon: Sanda Klein,  MD;  Location: [facility] CATH LAB;  Service: Cardiovascular;  Laterality: N/A;  . Lead revision N/A 01/02/2014    Procedure: LEAD REVISION;  Surgeon: Sanda Klein, MD;  Location: [facility] CATH LAB;  Service: Cardiovascular;  Laterality: N/A;  . Subxyphoid pericardial window N/A 01/16/2015    Procedure: SUBXYPHOID PERICARDIAL WINDOW;  Surgeon: Grace Isaac, MD;  Location: [place];  Service: Thoracic;  Laterality: N/A;  . Tee without cardioversion N/A 01/16/2015    Procedure: TRANSESOPHAGEAL ECHOCARDIOGRAM (TEE);  Surgeon: Grace Isaac, MD;  Location: [facility];  Service: Thoracic;  Laterality: N/A;   Social History    Social History  . Marital Status: Widowed    Spouse Name: N/A  . Number of Children: N/A  . Years of Education: N/A   Social History Main Topics  . Smoking status: Never Smoker   . Smokeless tobacco: Never Used  . Alcohol Use: 0.0 oz/week    0 Standard drinks or equivalent per week     Comment: 01/02/2014 "I'll have a drink a few times/year"  . Drug Use: No  . Sexual Activity: No   Other Topics Concern  . None   Social History Narrative   Family History  Problem Relation Age of Onset  . Stroke Mother   . Stroke Father   . Pneumonia Father   . Colon cancer Sister   . Colon cancer Daughter    Allergies  Allergen Reactions  . Iodinated Diagnostic Agents Hives    HIVES 15MIN S/P IV CONTRAST INJECTION,WILL NEED 13 HR PREP FOR FUTURE INJECTIONS, ok s/p 50mg  po benadryl//a.calhoun  . Anti-Inflammatory Enzyme [Nutritional Supplements]     Retains fluids and headaches  . Arthrotec [Diclofenac-Misoprostol] Other (See Comments)    unknown  . Biaxin [Clarithromycin] Other (See Comments)    unknown  . Plavix [Clopidogrel Bisulfate] Other (See Comments)    unknown  . Spironolactone     Hair loss   Prior to Admission medications   Medication Sig Start Date End Date Taking? Authorizing Provider  amoxicillin-clavulanate (AUGMENTIN) 500-125 MG tablet Take 1 tablet (500 mg total) by mouth every 12 (twelve) hours. Start tonight. 05/08/15  Yes Mercy Riding, MD  Digoxin 62.5 MCG TABS Take 0.0625 mg by mouth daily. Take only 5 days a week; skip Wednesdays and Sundays 03/20/15  Yes Mihai Croitoru, MD  gabapentin (NEURONTIN) 100 MG capsule Take 1 capsule (100 mg total) by mouth 3 (three) times daily. 04/23/15  Yes Darlyne Russian, MD  hydrocortisone-pramoxine [facility]) 2.5-1 % rectal cream Place 1 application rectally 3 (three) times daily. Reported on 05/01/2015   Yes Historical Provider, MD  metolazone (ZAROXOLYN) 2.5 MG tablet Take 1 tablet (2.5 mg total) by mouth daily. 05/08/15  Yes Mercy Riding, MD  ondansetron (ZOFRAN ODT) 4 MG disintegrating tablet Take 1 tablet (4 mg total) by mouth every 8 (eight) hours as needed for nausea or vomiting. 03/09/15  Yes Peshtigo N Rumley, DO  potassium chloride (K-DUR,KLOR-CON) 10 MEQ tablet Take 2 tablets (20 mEq total) by mouth 2 (two) times daily. Take with Torsemide 04/11/15  Yes Hillary Corinda Gubler, MD  torsemide (DEMADEX) 20 MG tablet Take 4 tablets (80 mg total) by mouth daily. 05/08/15  Yes Mercy Riding, MD  verapamil (CALAN-SR) 180 MG CR tablet take 1 tablet by mouth at bedtime 03/26/15  Yes Mihai Croitoru, MD  warfarin (COUMADIN) 5 MG tablet Take 2.5-5 mg by mouth as directed. Takes 0.5 tablet (2.5 mg total) by mouth Monday, Wednesday,  Friday, Saturday. Take 1 tablet (5 mg total) by mouth Sunday, Tuesday, Thursday.   Yes Historical Provider, MD     ROS: The patient denies fevers, chills, night sweats, unintentional weight loss, chest pain, palpitations, wheezing, dyspnea on exertion, nausea, vomiting, dysuria, hematuria, melena, numbness, weakness, or tingling.   All other systems have been reviewed and were otherwise negative with the exception of those mentioned in the HPI and as above.    PHYSICAL EXAM: Filed Vitals:   05/11/15 1208  BP: 106/40  Pulse: 77  Temp: 97.8 F (36.6 C)  Resp: 17   Body mass index is 22.88 kg/(m^2).   General: Alert, no acute distress HEENT:  Normocephalic, atraumatic, oropharynx patent. Eye: Juliette Mangle [facility] Cardiovascular:  Regular rate and rhythm, no rubs murmurs or gallops.  No Carotid bruits, radial pulse intact. No pedal edema.  Respiratory: Clear to auscultation bilaterally.  No wheezes, rales, or rhonchi.  No cyanosis, no use of accessory musculature Abdominal: No organomegaly, abdomen is soft and non-tender, positive bowel sounds.  No masses. Musculoskeletal: Gait intact. No edema, tenderness Skin: No rashes. Neurologic: Facial musculature symmetric. Psychiatric: Patient acts  appropriately throughout our interaction. Lymphatic: No cervical or submandibular lymphadenopathy   LABS: Results for orders placed or performed in visit on 05/11/15  POCT CBC  Result Value Ref Range   WBC 14.4 (A) 4.6 - 10.2 K/uL   Lymph, poc 1.7 0.6 - 3.4   POC LYMPH PERCENT 12.0 10 - 50 %L   MID (cbc) 0.8 0 - 0.9   POC MID % 5.9 0 - 12 %M   POC Granulocyte 11.8 (A) 2 - 6.9   Granulocyte percent 82.1 (A) 37 - 80 %G   RBC 4.07 4.04 - 5.48 M/uL   Hemoglobin 11.0 (A) 12.2 - 16.2 g/dL   HCT, POC 32.5 (A) 37.7 - 47.9 %   MCV 79.9 (A) 80 - 97 fL   MCH, POC 27.1 27 - 31.2 pg   MCHC 34.0 31.8 - 35.4 g/dL   RDW, POC 14.8 %   Platelet Count, POC 402 142 - 424 K/uL   MPV 7.2 0 - 99.8 fL    ASSESSMENT/PLAN: White count is still 14,000. I do not see any medical solution to this problem.I did give her 5 more days of Augmentin. She will have to decide if she wants to undertake the risks of surgery. She is going to think about this over the next few days. She has been having some fasciculations in her muscles and is concerned about her potassium and this was done today.I personally performed the services described in this documentation, which was scribed in my presence. The recorded information has been reviewed and is accurate.   Gross sideeffects, risk and benefits, and alternatives of medications d/w patient. Patient is aware that all medications have potential sideeffects and we are unable to predict every sideeffect or drug-drug interaction that may occur.  [name] MD 05/11/2015 12:17 PM

## 2015-05-11 NOTE — Patient Instructions (Signed)
     IF you received an x-ray today, you will receive an invoice from [facility]. Please contact [facility] at [phone] with questions or concerns regarding your invoice.   IF you received labwork today, you will receive an invoice from [facility]. Please contact [facility] at [phone] with questions or concerns regarding your invoice.   Our billing staff will not be able to assist you with questions regarding bills from these companies.  You will be contacted with the lab results as soon as they are available. The fastest way to get your results is to activate your My Chart account. Instructions are located on the last page of this paperwork. If you have not heard from us regarding the results in 2 weeks, please contact this office.      

## 2015-05-12 LAB — SEDIMENTATION RATE: SED RATE: 95 mm/h — AB (ref 0–30)

## 2015-05-13 ENCOUNTER — Other Ambulatory Visit: Payer: Self-pay | Admitting: Emergency Medicine

## 2015-05-14 ENCOUNTER — Other Ambulatory Visit (INDEPENDENT_AMBULATORY_CARE_PROVIDER_SITE_OTHER): Payer: Medicare Other

## 2015-05-14 DIAGNOSIS — E878 Other disorders of electrolyte and fluid balance, not elsewhere classified: Secondary | ICD-10-CM | POA: Diagnosis not present

## 2015-05-14 LAB — BASIC METABOLIC PANEL
BUN: 49 mg/dL — AB (ref 7–25)
CHLORIDE: 82 mmol/L — AB (ref 98–110)
CO2: 39 mmol/L — AB (ref 20–31)
Calcium: 9.3 mg/dL (ref 8.6–10.4)
Creat: 1.32 mg/dL — ABNORMAL HIGH (ref 0.60–0.88)
GLUCOSE: 171 mg/dL — AB (ref 65–99)
POTASSIUM: 3 mmol/L — AB (ref 3.5–5.3)
SODIUM: 136 mmol/L (ref 135–146)

## 2015-05-15 ENCOUNTER — Other Ambulatory Visit: Payer: Self-pay | Admitting: Emergency Medicine

## 2015-05-16 ENCOUNTER — Ambulatory Visit: Payer: Medicare Other | Admitting: Nurse Practitioner

## 2015-05-22 ENCOUNTER — Ambulatory Visit (INDEPENDENT_AMBULATORY_CARE_PROVIDER_SITE_OTHER): Payer: Medicare Other | Admitting: Pharmacist Clinician (PhC)/ Clinical Pharmacy Specialist

## 2015-05-22 ENCOUNTER — Telehealth: Payer: Self-pay | Admitting: Cardiovascular Disease

## 2015-05-22 DIAGNOSIS — I6310 Cerebral infarction due to embolism of unspecified precerebral artery: Secondary | ICD-10-CM

## 2015-05-22 DIAGNOSIS — Z7901 Long term (current) use of anticoagulants: Secondary | ICD-10-CM

## 2015-05-22 LAB — POCT INR: INR: 7.1

## 2015-05-22 NOTE — Telephone Encounter (Signed)
Forward to anticoag pool 

## 2015-05-22 NOTE — Telephone Encounter (Signed)
[name] is calling from [facility] with a Critical INR of 7 taking today on [name]

## 2015-05-23 NOTE — Telephone Encounter (Signed)
See anticoag note

## 2015-05-24 ENCOUNTER — Encounter: Payer: Self-pay | Admitting: Emergency Medicine

## 2015-05-24 ENCOUNTER — Other Ambulatory Visit: Payer: Self-pay | Admitting: Gastroenterology

## 2015-05-24 ENCOUNTER — Encounter: Payer: Self-pay | Admitting: Cardiovascular Disease

## 2015-05-24 DIAGNOSIS — R1903 Right lower quadrant abdominal swelling, mass and lump: Secondary | ICD-10-CM

## 2015-05-24 DIAGNOSIS — Z8719 Personal history of other diseases of the digestive system: Secondary | ICD-10-CM

## 2015-05-24 DIAGNOSIS — R198 Other specified symptoms and signs involving the digestive system and abdomen: Secondary | ICD-10-CM

## 2015-05-24 DIAGNOSIS — R1904 Left lower quadrant abdominal swelling, mass and lump: Secondary | ICD-10-CM

## 2015-05-24 DIAGNOSIS — R634 Abnormal weight loss: Secondary | ICD-10-CM

## 2015-05-24 LAB — POCT INR: INR: 3.1

## 2015-05-25 ENCOUNTER — Encounter (HOSPITAL_COMMUNITY): Payer: Self-pay | Admitting: Family Medicine

## 2015-05-25 ENCOUNTER — Inpatient Hospital Stay (HOSPITAL_COMMUNITY)
Admission: EM | Admit: 2015-05-25 | Discharge: 2015-06-14 | DRG: 329 | Disposition: A | Discharge status: Rehabilitation Facility | Payer: Medicare Other | Attending: Family Medicine | Admitting: Family Medicine

## 2015-05-25 ENCOUNTER — Emergency Department (HOSPITAL_COMMUNITY): Payer: Medicare Other

## 2015-05-25 ENCOUNTER — Ambulatory Visit (INDEPENDENT_AMBULATORY_CARE_PROVIDER_SITE_OTHER): Payer: Medicare Other | Admitting: Emergency Medicine

## 2015-05-25 VITALS — BP 110/50 | HR 90 | Temp 98.9°F | Resp 16 | Ht 65.0 in | Wt 124.6 lb

## 2015-05-25 DIAGNOSIS — I82621 Acute embolism and thrombosis of deep veins of right upper extremity: Secondary | ICD-10-CM | POA: Diagnosis not present

## 2015-05-25 DIAGNOSIS — T45515A Adverse effect of anticoagulants, initial encounter: Secondary | ICD-10-CM | POA: Diagnosis not present

## 2015-05-25 DIAGNOSIS — B370 Candidal stomatitis: Secondary | ICD-10-CM | POA: Diagnosis not present

## 2015-05-25 DIAGNOSIS — Z7901 Long term (current) use of anticoagulants: Secondary | ICD-10-CM

## 2015-05-25 DIAGNOSIS — Z9841 Cataract extraction status, right eye: Secondary | ICD-10-CM | POA: Diagnosis not present

## 2015-05-25 DIAGNOSIS — R0689 Other abnormalities of breathing: Secondary | ICD-10-CM | POA: Insufficient documentation

## 2015-05-25 DIAGNOSIS — K9189 Other postprocedural complications and disorders of digestive system: Secondary | ICD-10-CM

## 2015-05-25 DIAGNOSIS — K5732 Diverticulitis of large intestine without perforation or abscess without bleeding: Secondary | ICD-10-CM | POA: Diagnosis not present

## 2015-05-25 DIAGNOSIS — R339 Retention of urine, unspecified: Secondary | ICD-10-CM | POA: Insufficient documentation

## 2015-05-25 DIAGNOSIS — D62 Acute posthemorrhagic anemia: Secondary | ICD-10-CM | POA: Diagnosis not present

## 2015-05-25 DIAGNOSIS — Z79899 Other long term (current) drug therapy: Secondary | ICD-10-CM

## 2015-05-25 DIAGNOSIS — I10 Essential (primary) hypertension: Secondary | ICD-10-CM | POA: Diagnosis present

## 2015-05-25 DIAGNOSIS — T501X5A Adverse effect of loop [high-ceiling] diuretics, initial encounter: Secondary | ICD-10-CM | POA: Diagnosis present

## 2015-05-25 DIAGNOSIS — A047 Enterocolitis due to Clostridium difficile: Secondary | ICD-10-CM | POA: Diagnosis not present

## 2015-05-25 DIAGNOSIS — Z800 Family history of malignant neoplasm of digestive organs: Secondary | ICD-10-CM

## 2015-05-25 DIAGNOSIS — Z8673 Personal history of transient ischemic attack (TIA), and cerebral infarction without residual deficits: Secondary | ICD-10-CM

## 2015-05-25 DIAGNOSIS — I482 Chronic atrial fibrillation, unspecified: Secondary | ICD-10-CM | POA: Diagnosis present

## 2015-05-25 DIAGNOSIS — K922 Gastrointestinal hemorrhage, unspecified: Secondary | ICD-10-CM | POA: Diagnosis not present

## 2015-05-25 DIAGNOSIS — I5032 Chronic diastolic (congestive) heart failure: Secondary | ICD-10-CM | POA: Insufficient documentation

## 2015-05-25 DIAGNOSIS — E876 Hypokalemia: Secondary | ICD-10-CM | POA: Diagnosis present

## 2015-05-25 DIAGNOSIS — G4730 Sleep apnea, unspecified: Secondary | ICD-10-CM | POA: Diagnosis present

## 2015-05-25 DIAGNOSIS — Z881 Allergy status to other antibiotic agents status: Secondary | ICD-10-CM

## 2015-05-25 DIAGNOSIS — E878 Other disorders of electrolyte and fluid balance, not elsewhere classified: Secondary | ICD-10-CM | POA: Diagnosis present

## 2015-05-25 DIAGNOSIS — R1032 Left lower quadrant pain: Secondary | ICD-10-CM | POA: Diagnosis present

## 2015-05-25 DIAGNOSIS — R609 Edema, unspecified: Secondary | ICD-10-CM | POA: Diagnosis not present

## 2015-05-25 DIAGNOSIS — R5381 Other malaise: Secondary | ICD-10-CM | POA: Diagnosis not present

## 2015-05-25 DIAGNOSIS — I6930 Unspecified sequelae of cerebral infarction: Secondary | ICD-10-CM | POA: Insufficient documentation

## 2015-05-25 DIAGNOSIS — R509 Fever, unspecified: Secondary | ICD-10-CM | POA: Diagnosis not present

## 2015-05-25 DIAGNOSIS — R338 Other retention of urine: Secondary | ICD-10-CM | POA: Diagnosis not present

## 2015-05-25 DIAGNOSIS — Z823 Family history of stroke: Secondary | ICD-10-CM

## 2015-05-25 DIAGNOSIS — E871 Hypo-osmolality and hyponatremia: Secondary | ICD-10-CM | POA: Diagnosis present

## 2015-05-25 DIAGNOSIS — R109 Unspecified abdominal pain: Secondary | ICD-10-CM

## 2015-05-25 DIAGNOSIS — E43 Unspecified severe protein-calorie malnutrition: Secondary | ICD-10-CM

## 2015-05-25 DIAGNOSIS — R195 Other fecal abnormalities: Secondary | ICD-10-CM | POA: Insufficient documentation

## 2015-05-25 DIAGNOSIS — R1030 Lower abdominal pain, unspecified: Secondary | ICD-10-CM

## 2015-05-25 DIAGNOSIS — K5780 Diverticulitis of intestine, part unspecified, with perforation and abscess without bleeding: Secondary | ICD-10-CM | POA: Diagnosis not present

## 2015-05-25 DIAGNOSIS — E119 Type 2 diabetes mellitus without complications: Secondary | ICD-10-CM

## 2015-05-25 DIAGNOSIS — N321 Vesicointestinal fistula: Secondary | ICD-10-CM | POA: Diagnosis present

## 2015-05-25 DIAGNOSIS — N739 Female pelvic inflammatory disease, unspecified: Secondary | ICD-10-CM | POA: Diagnosis present

## 2015-05-25 DIAGNOSIS — Z933 Colostomy status: Secondary | ICD-10-CM | POA: Diagnosis not present

## 2015-05-25 DIAGNOSIS — R1084 Generalized abdominal pain: Secondary | ICD-10-CM | POA: Diagnosis not present

## 2015-05-25 DIAGNOSIS — H9190 Unspecified hearing loss, unspecified ear: Secondary | ICD-10-CM

## 2015-05-25 DIAGNOSIS — N2889 Other specified disorders of kidney and ureter: Secondary | ICD-10-CM | POA: Diagnosis present

## 2015-05-25 DIAGNOSIS — E873 Alkalosis: Secondary | ICD-10-CM | POA: Diagnosis present

## 2015-05-25 DIAGNOSIS — R791 Abnormal coagulation profile: Secondary | ICD-10-CM | POA: Diagnosis present

## 2015-05-25 DIAGNOSIS — K5720 Diverticulitis of large intestine with perforation and abscess without bleeding: Secondary | ICD-10-CM | POA: Diagnosis not present

## 2015-05-25 DIAGNOSIS — Z888 Allergy status to other drugs, medicaments and biological substances status: Secondary | ICD-10-CM

## 2015-05-25 DIAGNOSIS — D72829 Elevated white blood cell count, unspecified: Secondary | ICD-10-CM | POA: Insufficient documentation

## 2015-05-25 DIAGNOSIS — G8918 Other acute postprocedural pain: Secondary | ICD-10-CM | POA: Insufficient documentation

## 2015-05-25 DIAGNOSIS — R1904 Left lower quadrant abdominal swelling, mass and lump: Secondary | ICD-10-CM

## 2015-05-25 DIAGNOSIS — J189 Pneumonia, unspecified organism: Secondary | ICD-10-CM

## 2015-05-25 DIAGNOSIS — Z9842 Cataract extraction status, left eye: Secondary | ICD-10-CM

## 2015-05-25 DIAGNOSIS — I6310 Cerebral infarction due to embolism of unspecified precerebral artery: Secondary | ICD-10-CM

## 2015-05-25 DIAGNOSIS — Z9889 Other specified postprocedural states: Secondary | ICD-10-CM | POA: Diagnosis not present

## 2015-05-25 DIAGNOSIS — N189 Chronic kidney disease, unspecified: Secondary | ICD-10-CM

## 2015-05-25 DIAGNOSIS — N183 Chronic kidney disease, stage 3 unspecified: Secondary | ICD-10-CM

## 2015-05-25 DIAGNOSIS — Z789 Other specified health status: Secondary | ICD-10-CM | POA: Insufficient documentation

## 2015-05-25 DIAGNOSIS — I495 Sick sinus syndrome: Secondary | ICD-10-CM | POA: Diagnosis present

## 2015-05-25 DIAGNOSIS — D649 Anemia, unspecified: Secondary | ICD-10-CM | POA: Insufficient documentation

## 2015-05-25 DIAGNOSIS — T402X5A Adverse effect of other opioids, initial encounter: Secondary | ICD-10-CM | POA: Diagnosis not present

## 2015-05-25 DIAGNOSIS — R000 Tachycardia, unspecified: Secondary | ICD-10-CM | POA: Insufficient documentation

## 2015-05-25 DIAGNOSIS — Z91041 Radiographic dye allergy status: Secondary | ICD-10-CM | POA: Diagnosis not present

## 2015-05-25 DIAGNOSIS — E1122 Type 2 diabetes mellitus with diabetic chronic kidney disease: Secondary | ICD-10-CM | POA: Diagnosis present

## 2015-05-25 DIAGNOSIS — E1165 Type 2 diabetes mellitus with hyperglycemia: Secondary | ICD-10-CM | POA: Diagnosis not present

## 2015-05-25 DIAGNOSIS — R627 Adult failure to thrive: Secondary | ICD-10-CM | POA: Diagnosis present

## 2015-05-25 DIAGNOSIS — I130 Hypertensive heart and chronic kidney disease with heart failure and stage 1 through stage 4 chronic kidney disease, or unspecified chronic kidney disease: Secondary | ICD-10-CM | POA: Diagnosis present

## 2015-05-25 DIAGNOSIS — K567 Ileus, unspecified: Secondary | ICD-10-CM | POA: Diagnosis not present

## 2015-05-25 DIAGNOSIS — Z950 Presence of cardiac pacemaker: Secondary | ICD-10-CM | POA: Diagnosis present

## 2015-05-25 DIAGNOSIS — Z9049 Acquired absence of other specified parts of digestive tract: Secondary | ICD-10-CM | POA: Insufficient documentation

## 2015-05-25 DIAGNOSIS — E440 Moderate protein-calorie malnutrition: Secondary | ICD-10-CM

## 2015-05-25 DIAGNOSIS — R1110 Vomiting, unspecified: Secondary | ICD-10-CM

## 2015-05-25 DIAGNOSIS — Z87898 Personal history of other specified conditions: Secondary | ICD-10-CM | POA: Diagnosis not present

## 2015-05-25 DIAGNOSIS — N179 Acute kidney failure, unspecified: Secondary | ICD-10-CM | POA: Diagnosis not present

## 2015-05-25 DIAGNOSIS — K5792 Diverticulitis of intestine, part unspecified, without perforation or abscess without bleeding: Secondary | ICD-10-CM | POA: Diagnosis present

## 2015-05-25 LAB — LIPASE, BLOOD: Lipase: 25 U/L (ref 11–51)

## 2015-05-25 LAB — CBC WITH DIFFERENTIAL/PLATELET
BASOS ABS: 0 10*3/uL (ref 0.0–0.1)
BASOS PCT: 0 %
Eosinophils Absolute: 0 10*3/uL (ref 0.0–0.7)
Eosinophils Relative: 0 %
HCT: 33.8 % — ABNORMAL LOW (ref 36.0–46.0)
Hemoglobin: 10.4 g/dL — ABNORMAL LOW (ref 12.0–15.0)
LYMPHS ABS: 1.1 10*3/uL (ref 0.7–4.0)
Lymphocytes Relative: 4 %
MCH: 25.6 pg — AB (ref 26.0–34.0)
MCHC: 30.8 g/dL (ref 30.0–36.0)
MCV: 83.3 fL (ref 78.0–100.0)
MONOS PCT: 4 %
Monocytes Absolute: 1.1 10*3/uL — ABNORMAL HIGH (ref 0.1–1.0)
NEUTROS ABS: 25.3 10*3/uL — AB (ref 1.7–7.7)
Neutrophils Relative %: 92 %
Platelets: 470 10*3/uL — ABNORMAL HIGH (ref 150–400)
RBC: 4.06 MIL/uL (ref 3.87–5.11)
RDW: 15.2 % (ref 11.5–15.5)
WBC: 27.5 10*3/uL — ABNORMAL HIGH (ref 4.0–10.5)

## 2015-05-25 LAB — COMPREHENSIVE METABOLIC PANEL
ALBUMIN: 2.4 g/dL — AB (ref 3.5–5.0)
ALT: 21 U/L (ref 14–54)
ANION GAP: 14 (ref 5–15)
AST: 27 U/L (ref 15–41)
Alkaline Phosphatase: 109 U/L (ref 38–126)
BILIRUBIN TOTAL: 0.8 mg/dL (ref 0.3–1.2)
BUN: 37 mg/dL — AB (ref 6–20)
CALCIUM: 8.8 mg/dL — AB (ref 8.9–10.3)
CO2: 35 mmol/L — AB (ref 22–32)
CREATININE: 1.44 mg/dL — AB (ref 0.44–1.00)
Chloride: 84 mmol/L — ABNORMAL LOW (ref 101–111)
GFR, EST AFRICAN AMERICAN: 36 mL/min — AB (ref >60)
GFR, EST NON AFRICAN AMERICAN: 31 mL/min — AB (ref >60)
GLUCOSE: 111 mg/dL — AB (ref 65–99)
Potassium: 3 mmol/L — ABNORMAL LOW (ref 3.5–5.1)
Sodium: 133 mmol/L — ABNORMAL LOW (ref 135–145)
Total Protein: 6.4 g/dL — ABNORMAL LOW (ref 6.5–8.1)

## 2015-05-25 LAB — URINE MICROSCOPIC-ADD ON

## 2015-05-25 LAB — URINALYSIS, ROUTINE W REFLEX MICROSCOPIC
Bilirubin Urine: NEGATIVE
Glucose, UA: NEGATIVE mg/dL
Ketones, ur: NEGATIVE mg/dL
Nitrite: NEGATIVE
PROTEIN: NEGATIVE mg/dL
SPECIFIC GRAVITY, URINE: 1.01 (ref 1.005–1.030)
pH: 5.5 (ref 5.0–8.0)

## 2015-05-25 LAB — PROTIME-INR
INR: 3.01 — ABNORMAL HIGH (ref 0.00–1.49)
PROTHROMBIN TIME: 30.7 s — AB (ref 11.6–15.2)

## 2015-05-25 LAB — LACTIC ACID, PLASMA: Lactic Acid, Venous: 1.5 mmol/L (ref 0.5–2.0)

## 2015-05-25 MED ORDER — ONDANSETRON HCL 4 MG/2ML IJ SOLN
4.0000 mg | Freq: Four times a day (QID) | INTRAMUSCULAR | Status: DC | PRN
  Administered 2015-05-27 – 2015-06-10 (×9): 4 mg via INTRAVENOUS
  Filled 2015-05-25 (×12): qty 2

## 2015-05-25 MED ORDER — POTASSIUM CHLORIDE CRYS ER 20 MEQ PO TBCR
40.0000 meq | EXTENDED_RELEASE_TABLET | Freq: Once | ORAL | Status: AC
  Administered 2015-05-25: 40 meq via ORAL
  Filled 2015-05-25: qty 2

## 2015-05-25 MED ORDER — FENTANYL CITRATE (PF) 100 MCG/2ML IJ SOLN
50.0000 ug | INTRAMUSCULAR | Status: DC | PRN
  Administered 2015-05-25 – 2015-05-26 (×3): 50 ug via INTRAVENOUS
  Filled 2015-05-25 (×3): qty 2

## 2015-05-25 MED ORDER — ONDANSETRON HCL 4 MG PO TABS
4.0000 mg | ORAL_TABLET | Freq: Four times a day (QID) | ORAL | Status: DC | PRN
  Administered 2015-05-25: 4 mg via ORAL

## 2015-05-25 MED ORDER — ONDANSETRON HCL 4 MG/2ML IJ SOLN
4.0000 mg | Freq: Once | INTRAMUSCULAR | Status: AC
  Administered 2015-05-25: 4 mg via INTRAVENOUS
  Filled 2015-05-25: qty 2

## 2015-05-25 MED ORDER — SODIUM CHLORIDE 0.9 % IV BOLUS (SEPSIS)
1000.0000 mL | Freq: Once | INTRAVENOUS | Status: AC
  Administered 2015-05-25: 500 mL via INTRAVENOUS

## 2015-05-25 MED ORDER — PIPERACILLIN-TAZOBACTAM 3.375 G IVPB
3.3750 g | Freq: Three times a day (TID) | INTRAVENOUS | Status: DC
  Administered 2015-05-26 – 2015-05-27 (×4): 3.375 g via INTRAVENOUS
  Filled 2015-05-25 (×7): qty 50

## 2015-05-25 MED ORDER — VERAPAMIL HCL ER 180 MG PO TBCR
180.0000 mg | EXTENDED_RELEASE_TABLET | Freq: Every day | ORAL | Status: DC
  Filled 2015-05-25: qty 1

## 2015-05-25 MED ORDER — PIPERACILLIN-TAZOBACTAM 3.375 G IVPB 30 MIN
3.3750 g | Freq: Once | INTRAVENOUS | Status: AC
  Administered 2015-05-25: 3.375 g via INTRAVENOUS
  Filled 2015-05-25: qty 50

## 2015-05-25 MED ORDER — FENTANYL CITRATE (PF) 100 MCG/2ML IJ SOLN
50.0000 ug | Freq: Once | INTRAMUSCULAR | Status: AC
  Administered 2015-05-25: 50 ug via INTRAVENOUS
  Filled 2015-05-25: qty 2

## 2015-05-25 NOTE — H&P (Signed)
[facility] Admission History and Physical Service Pager: [phone]  Patient name: [name] Medical record number: [id number] Date of birth: Jan 05, 1924 Age: 82 y.o. Gender: female  Primary Care Provider: [name], MD Consultants: Surgery Code Status: Full; Daughter is HCPOA  Chief Complaint: LLQ abdominal pain  Assessment and Plan: [name] is a 82 y.o. female presenting with LLQ abdominal pain. PMH is significant for atrial fibrillation, hx of CVA, hypertension, type 2 diabetes, tachycardia-bradycardia syndrome s/p pacemaker, prior pericardial effusion s/p pericardial window 12/16, sleep apnea (no CPAP), stroke (right frontal 2013), GI bleeding with hemorrhoids, and arthritis and recurrent diverticulitis.  Recurrent diverticulitis with possible perforation / abscess:CT showed: diverticulitis involving the sigmoid colon with prreviously noted extra luminal collection of gas is again noted and is increased in size from previous exam, worrisome for abscess. Additionally, there are several additional suspicious collections of fluid and gas which may represent abscesses. WBC 27.5 (4/29) - Appreciate surg recs:   Likely will need PICC and TPN  Will need repeat CT with contrast 4/30  Possible Colectomy 5/1  - Abx: Zosyn per pharm 4/29>> - Pain control: Fentanyl 50 mcg q3hrs prn - zosyn prn for nausea  Atrial fibrillation: INR supra-therapeutic on 4/26 - Continue Verapamil and Digoxin  - warfarin per pharmacy - Check INR  DM2: Diet contorlled. A1c 6.2 (12/2014) - monitor cbgs - check A1c  Anemia: Hgb 10.4 on admit (baseline 11). History of diverticulitis and hemorrhoids - monitor CBC  CKD stage 3: Baseline Cr 1-1.2 - Cr 1.44 (admit; 4/29) - Monitor BMP  Metabolic alkalosis/Hypokalemia/hypochloremia:  Likely secondary to diuretics. - Monitor and replace K  FEN/GI: Reg Diet; SLIV Prophylaxis: Warfarin per pharm  Disposition:  medsurg  History of Present Illness:  [name] is a 82 y.o. female presenting with persistent LLQ abdominal pain. Also complaining of weakness, fatigue and anorexia. Had continued to have pain since her last hospitalization and was sent home on Augmentin for diverticulitis. Denies fevers, nausea, vomiting.   Review Of Systems: Per HPI with the following additions:  Otherwise the remainder of the systems were negative.  Patient Active Problem List   Diagnosis Date Noted  . Diverticulitis of intestine with perforation and abscess 05/25/2015  . Diverticulosis   . Chronic thoracic back pain   . Acute on chronic diastolic heart failure ([place])   . Perforation of intestine due to diverticulitis of gastrointestinal tract ([place])   . SOB (shortness of breath) 05/01/2015  . Supratherapeutic INR   . Paroxysmal atrial fibrillation (HCC)   . Essential hypertension, benign   . Left sided abdominal pain   . Diverticulitis 03/08/2015  . Obesity   . Chronic atrial fibrillation ([place])   . Upper airway cough syndrome   . Dyspnea 05/22/2014  . Tachycardia-bradycardia syndrome with symptomatic bradycardia 01/03/2014  . History of CVA (cerebrovascular accident) 10/25/2013  . Diabetes ([place]) 08/03/2013  . Myofacial muscle pain 10/16/2011  . Back pain, Left Flank 07/01/2011  . Cerebral embolism with cerebral infarction ([place]) 06/12/2011  . CVA (cerebral infarction) 05/20/2011  . Weakness of left side of body 05/16/2011  . Elevated digoxin level 05/16/2011  . Pacemaker 05/21/2010  . Edema 04/24/2010  . Pericardial effusion, Large   . Chronic anticoagulation - Coumadin, CHADS2VASC=7   . HTN (hypertension)     Past Medical History: Past Medical History  Diagnosis Date  . Pericardial effusion     a. s/p pericardial window 01/16/15  . History of GI  bleed     a. secondary to AVM's. Treated with Fe infusion  . HTN (hypertension)   . Obesity   . LVH (left ventricular hypertrophy)   . Sleep apnea      mild-no cpap  . Positive TB test   . Anemia   . Stroke [facility])     a. 2013: right frontal  . Arthritis   . Tachycardia-bradycardia syndrome [facility])     a. s/p Med[employer]onic [place], model number [id number], serial number [id number] 12/2013  . History of shingles   . Chronic diastolic (congestive) heart failure ([place])     a. Echo 01/2015 with EF of 55-60%.  . Chronic atrial fibrillation (HCC)     a. on Coumadin, Digoxin, and Verapamil    Past Surgical History: Past Surgical History  Procedure Laterality Date  . Breast lumpectomy Bilateral     negative for cancer  . Esophagogastroduodenoscopy  05/19/2011    Procedure: ESOPHAGOGASTRODUODENOSCOPY (EGD);  Surgeon: Lear Ng, MD;  Location: [facility] ENDOSCOPY;  Service: Endoscopy;  Laterality: N/A;  doctor aware of inr   will try to be here no later than 230  . Cholecystectomy  2005  . Esophagogastroduodenoscopy  06/22/2011    Procedure: ESOPHAGOGASTRODUODENOSCOPY (EGD);  Surgeon: Arta Silence, MD;  Location: [facility] ENDOSCOPY;  Service: Endoscopy;  Laterality: N/A;  Check PT/INR in am  . Givens capsule study  06/23/2011    Procedure: GIVENS CAPSULE STUDY;  Surgeon: Arta Silence, MD;  Location: [facility] ENDOSCOPY;  Service: Endoscopy;  Laterality: N/A;  . Colonoscopy  08/11/2011    Procedure: COLONOSCOPY;  Surgeon: Jeryl Columbia, MD;  Location: [facility] ENDOSCOPY;  Service: Endoscopy;  Laterality: N/A;  . Hot hemostasis  08/11/2011    Procedure: HOT HEMOSTASIS (ARGON PLASMA COAGULATION/BICAP);  Surgeon: Jeryl Columbia, MD;  Location: [facility] ENDOSCOPY;  Service: Endoscopy;  Laterality: N/A;  . Mass excision Left 09/14/2013    Procedure: EXCISION MASS LEFT WRIST;  Surgeon: Leanora Cover, MD;  Location: [place];  Service: Orthopedics;  Laterality: Left;  . Tonsillectomy and adenoidectomy  1950  . Insert / replace / remove pacemaker  2001    Last generator in 2006; interrogated Dec 2012  . Cataract extraction w/ intraocular lens  implant, bilateral  Bilateral   . Lipoma excision Left 07/2013    wrist  . Pacemaker generator change N/A 01/02/2014    Procedure: PACEMAKER GENERATOR CHANGE;  Surgeon: Sanda Klein, MD;  Location: [place] CATH LAB;  Service: Cardiovascular;  Laterality: N/A;  . Lead revision N/A 01/02/2014    Procedure: LEAD REVISION;  Surgeon: Sanda Klein, MD;  Location: [place] CATH LAB;  Service: Cardiovascular;  Laterality: N/A;  . Subxyphoid pericardial window N/A 01/16/2015    Procedure: SUBXYPHOID PERICARDIAL WINDOW;  Surgeon: Grace Isaac, MD;  Location: [place];  Service: Thoracic;  Laterality: N/A;  . Tee without cardioversion N/A 01/16/2015    Procedure: TRANSESOPHAGEAL ECHOCARDIOGRAM (TEE);  Surgeon: Grace Isaac, MD;  Location: [facility] OR;  Service: Thoracic;  Laterality: N/A;    Social History: Social History  Substance Use Topics  . Smoking status: Never Smoker   . Smokeless tobacco: Never Used  . Alcohol Use: 0.0 oz/week    0 Standard drinks or equivalent per week     Comment: 01/02/2014 "I'll have a drink a few times/year"   Additional social history:  Please also refer to relevant sections of EMR.  Family History: Family History  Problem Relation Age of Onset  . Stroke Mother   .  Stroke Father   . Pneumonia Father   . Colon cancer Sister   . Colon cancer Daughter    Allergies and Medications: Allergies  Allergen Reactions  . Anti-Inflammatory Enzyme [Nutritional Supplements] Other (See Comments)    Retains fluids and headaches  . Iodinated Diagnostic Agents Hives    HIVES 15MIN S/P IV CONTRAST INJECTION,WILL NEED 13 HR PREP FOR FUTURE INJECTIONS, ok s/p 50mg  po benadryl//a.calhoun  . Spironolactone Other (See Comments)    Hair loss  . Arthrotec [Diclofenac-Misoprostol] Other (See Comments)    unknown  . Biaxin [Clarithromycin] Other (See Comments)    unknown  . Plavix [Clopidogrel Bisulfate] Other (See Comments)    unknown   No current facility-administered medications on file prior to  encounter.   Current Outpatient Prescriptions on File Prior to Encounter  Medication Sig Dispense Refill  . Digoxin 62.5 MCG TABS Take 0.0625 mg by mouth daily. Take only 5 days a week; skip Wednesdays and Sundays 30 tablet 6  . oxyCODONE (ROXICODONE) 5 MG immediate release tablet Take one half tablet in the morning as needed for back pain. (Patient taking differently: Take 2.5 mg by mouth daily as needed for severe pain. Take one half tablet in the morning as needed for back pain.) 20 tablet 0  . verapamil (CALAN-SR) 180 MG CR tablet take 1 tablet by mouth at bedtime (Patient taking differently: take 1 tablet by mouth ONCE DAILY) 30 tablet 11  . warfarin (COUMADIN) 5 MG tablet Take 2.5-5 mg by mouth as directed. Takes 0.5 tablet (2.5 mg total) by mouth Monday, Wednesday, Friday, Saturday. Take 1 tablet (5 mg total) by mouth Sunday, Tuesday, Thursday.    Marland Kitchen amoxicillin-clavulanate (AUGMENTIN) 500-125 MG tablet Take 1 tablet (500 mg total) by mouth every 12 (twelve) hours. Start tonight. (Patient not taking: Reported on 05/25/2015) 10 tablet 0  . gabapentin (NEURONTIN) 100 MG capsule Take 1 capsule (100 mg total) by mouth 3 (three) times daily. 90 capsule 3  . metolazone (ZAROXOLYN) 2.5 MG tablet Take 1 tablet (2.5 mg total) by mouth daily. (Patient not taking: Reported on 05/25/2015) 30 tablet 0  . ondansetron (ZOFRAN ODT) 4 MG disintegrating tablet Take 1 tablet (4 mg total) by mouth every 8 (eight) hours as needed for nausea or vomiting. (Patient not taking: Reported on 05/25/2015) 20 tablet 0  . potassium chloride (K-DUR,KLOR-CON) 10 MEQ tablet Take 2 tablets (20 mEq total) by mouth 2 (two) times daily. Take with Torsemide 120 tablet 0  . torsemide (DEMADEX) 20 MG tablet Take 4 tablets (80 mg total) by mouth daily. (Patient not taking: Reported on 05/25/2015) 120 tablet 0    Objective: BP 107/72 mmHg  Pulse 68  Temp(Src) 98.1 F (36.7 C) (Oral)  Resp 17  SpO2 98% Exam: General: NAD Eyes: EOMI;  PERRLA ENTM: MM dry Neck: supple Cardiovascular: irregularly irregular rhythm with 2/6 murmur; Pacemaker  Respiratory: CTAB; Normal effort Abdomen: Soft with voluntary guarding; tenderness greatest in LLQ with palpable mass MSK: normal tone Skin: no rash Neuro: A&Ox3; CN3-12 intact; Gross motor and sensory intact Psych: normal mood and affect   Labs and Imaging: CBC BMET   Recent Labs Lab 05/25/15 1639  WBC 27.5*  HGB 10.4*  HCT 33.8*  PLT 470*    Recent Labs Lab 05/25/15 1639  NA 133*  K 3.0*  CL 84*  CO2 35*  BUN 37*  CREATININE 1.44*  GLUCOSE 111*  CALCIUM 8.8*      Recent Labs Lab 05/22/15  INR 7.1  Ct Abdomen Pelvis Wo Contrast  IMPRESSION: 1. There are persistent inflammatory changes of diverticulitis involving the sigmoid colon. 2. Previously noted extra luminal collection of gas is again noted and is increased in size from previous exam, worrisome for abscess. Additionally, there are several additional suspicious collections of fluid and gas which may represent abscesses, but without enteric contrast material high cannot distinguish this from on opacified bowel loops. A repeat examination with IV, oral and rectal contrast may be helpful to assess for extent of left lower quadrant diverticular abscess formation. 3. Cardiac enlargement and pericardial effusion. 4. Aortic atherosclerosis. Electronically Signed   By: Kerby Moors M.D.   On: 05/25/2015 18:15    Olam Idler, MD 05/25/2015, 9:23 PM PGY-3, [place] Intern pager: [phone], text pages welcome

## 2015-05-25 NOTE — Patient Instructions (Signed)
     IF you received an x-ray today, you will receive an invoice from [facility]. Please contact [facility] at [phone] with questions or concerns regarding your invoice.   IF you received labwork today, you will receive an invoice from [facility]. Please contact [facility] at [phone] with questions or concerns regarding your invoice.   Our billing staff will not be able to assist you with questions regarding bills from these companies.  You will be contacted with the lab results as soon as they are available. The fastest way to get your results is to activate your My Chart account. Instructions are located on the last page of this paperwork. If you have not heard from us regarding the results in 2 weeks, please contact this office.      

## 2015-05-25 NOTE — ED Provider Notes (Signed)
CSN: [id number]     Arrival date & time    History   First MD Initiated Contact with Patient 05/25/15 1506     Chief Complaint  Patient presents with  . Abdominal Pain     (Consider location/radiation/quality/duration/timing/severity/associated sxs/prior Treatment) HPI 82 y.o. female with a hx of diverticulitis with CT scan showing contained perforation on 4/6 returns to the ED from her PCP's office due to worsening persistent abdominal pain, precipitating nausea and anorexia for the last several days. She states that her palpable mass of the abdominal perforation has gradually gotten larger and more tender. She has associated pain with BM and states that she has been having loose stools very frequently in small volumes. She denies any blood noted in her stool. No difficulty urinating. She denies any associated fevers. She is s/p multiple medical treatments and rounds of abx. She states that she was going to follow up with a general surgeon as an outpatient but was unable to make it to the appointment. She has a hx of CHF, but denies any significant SOB, CP, LE swelling suggestive of fluid overload.   Past Medical History  Diagnosis Date  . Pericardial effusion     a. s/p pericardial window 01/16/15  . History of GI bleed     a. secondary to AVM's. Treated with Fe infusion  . HTN (hypertension)   . Obesity   . LVH (left ventricular hypertrophy)   . Sleep apnea     mild-no cpap  . Positive TB test   . Anemia   . Stroke [facility])     a. 2013: right frontal  . Arthritis   . Tachycardia-bradycardia syndrome [facility])     a. s/p Medtronic [place], model number [id number], serial number [id number] H 12/2013  . History of shingles   . Chronic diastolic (congestive) heart failure ([place])     a. Echo 01/2015 with EF of 55-60%.  . Chronic atrial fibrillation (HCC)     a. on Coumadin, Digoxin, and Verapamil   Past Surgical History  Procedure Laterality Date  . Breast lumpectomy Bilateral     negative  for cancer  . Esophagogastroduodenoscopy  05/19/2011    Procedure: ESOPHAGOGASTRODUODENOSCOPY (EGD);  Surgeon: Lear Ng, MD;  Location: [place] ENDOSCOPY;  Service: Endoscopy;  Laterality: N/A;  doctor aware of inr   will try to be here no later than 230  . Cholecystectomy  2005  . Esophagogastroduodenoscopy  06/22/2011    Procedure: ESOPHAGOGASTRODUODENOSCOPY (EGD);  Surgeon: Arta Silence, MD;  Location: [facility] ENDOSCOPY;  Service: Endoscopy;  Laterality: N/A;  Check PT/INR in am  . Givens capsule study  06/23/2011    Procedure: GIVENS CAPSULE STUDY;  Surgeon: Arta Silence, MD;  Location: [facility] ENDOSCOPY;  Service: Endoscopy;  Laterality: N/A;  . Colonoscopy  08/11/2011    Procedure: COLONOSCOPY;  Surgeon: Jeryl Columbia, MD;  Location: [facility] ENDOSCOPY;  Service: Endoscopy;  Laterality: N/A;  . Hot hemostasis  08/11/2011    Procedure: HOT HEMOSTASIS (ARGON PLASMA COAGULATION/BICAP);  Surgeon: Jeryl Columbia, MD;  Location: [facility];  Service: Endoscopy;  Laterality: N/A;  . Mass excision Left 09/14/2013    Procedure: EXCISION MASS LEFT WRIST;  Surgeon: Leanora Cover, MD;  Location: [place];  Service: Orthopedics;  Laterality: Left;  . Tonsillectomy and adenoidectomy  1950  . Insert / replace / remove pacemaker  2001    Last generator in 2006; interrogated Dec 2012  . Cataract extraction w/ intraocular lens  implant, bilateral Bilateral   .  Lipoma excision Left 07/2013    wrist  . Pacemaker generator change N/A 01/02/2014    Procedure: PACEMAKER GENERATOR CHANGE;  Surgeon: Sanda Klein, MD;  Location: [place] CATH LAB;  Service: Cardiovascular;  Laterality: N/A;  . Lead revision N/A 01/02/2014    Procedure: LEAD REVISION;  Surgeon: Sanda Klein, MD;  Location: [place] CATH LAB;  Service: Cardiovascular;  Laterality: N/A;  . Subxyphoid pericardial window N/A 01/16/2015    Procedure: SUBXYPHOID PERICARDIAL WINDOW;  Surgeon: Grace Isaac, MD;  Location: [place];  Service: Thoracic;   Laterality: N/A;  . Tee without cardioversion N/A 01/16/2015    Procedure: TRANSESOPHAGEAL ECHOCARDIOGRAM (TEE);  Surgeon: Grace Isaac, MD;  Location: [facility];  Service: Thoracic;  Laterality: N/A;   Family History  Problem Relation Age of Onset  . Stroke Mother   . Stroke Father   . Pneumonia Father   . Colon cancer Sister   . Colon cancer Daughter    Social History  Substance Use Topics  . Smoking status: Never Smoker   . Smokeless tobacco: Never Used  . Alcohol Use: 0.0 oz/week    0 Standard drinks or equivalent per week     Comment: 01/02/2014 "I'll have a drink a few times/year"   OB History    No data available     Review of Systems  Constitutional: Negative for fever, chills, activity change and appetite change.  Respiratory: Negative for cough, chest tightness and shortness of breath.   Cardiovascular: Negative for chest pain.  Gastrointestinal: Positive for nausea (intermittent), abdominal pain and diarrhea (loose). Negative for vomiting, constipation, blood in stool, abdominal distention, anal bleeding and rectal pain.  Genitourinary: Negative for dysuria, urgency, frequency and flank pain.  Musculoskeletal: Negative for back pain and neck pain.  Skin: Negative for rash.  Neurological: Negative for syncope, light-headedness and headaches.  All other systems reviewed and are negative.     Allergies  Anti-inflammatory enzyme; Iodinated diagnostic agents; Spironolactone; Arthrotec; Biaxin; and Plavix  Home Medications   Prior to Admission medications   Medication Sig Start Date End Date Taking? Authorizing Provider  Digoxin 62.5 MCG TABS Take 0.0625 mg by mouth daily. Take only 5 days a week; skip Wednesdays and Sundays 03/20/15  Yes Mihai Croitoru, MD  oxyCODONE (ROXICODONE) 5 MG immediate release tablet Take one half tablet in the morning as needed for back pain. Patient taking differently: Take 2.5 mg by mouth daily as needed for severe pain. Take one half  tablet in the morning as needed for back pain. 05/11/15  Yes Darlyne Russian, MD  verapamil (CALAN-SR) 180 MG CR tablet take 1 tablet by mouth at bedtime Patient taking differently: take 1 tablet by mouth ONCE DAILY 03/26/15  Yes Mihai Croitoru, MD  warfarin (COUMADIN) 5 MG tablet Take 2.5-5 mg by mouth as directed. Takes 0.5 tablet (2.5 mg total) by mouth Monday, Wednesday, Friday, Saturday. Take 1 tablet (5 mg total) by mouth Sunday, Tuesday, Thursday.   Yes Historical Provider, MD  amoxicillin-clavulanate (AUGMENTIN) 500-125 MG tablet Take 1 tablet (500 mg total) by mouth every 12 (twelve) hours. Start tonight. Patient not taking: Reported on 05/25/2015 05/11/15   Darlyne Russian, MD  gabapentin (NEURONTIN) 100 MG capsule Take 1 capsule (100 mg total) by mouth 3 (three) times daily. 04/23/15   Darlyne Russian, MD  metolazone (ZAROXOLYN) 2.5 MG tablet Take 1 tablet (2.5 mg total) by mouth daily. Patient not taking: Reported on 05/25/2015 05/08/15   Mercy Riding, MD  ondansetron (ZOFRAN ODT) 4 MG disintegrating tablet Take 1 tablet (4 mg total) by mouth every 8 (eight) hours as needed for nausea or vomiting. Patient not taking: Reported on 05/25/2015 03/09/15   Burna Cash Rumley, DO  potassium chloride (K-DUR,KLOR-CON) 10 MEQ tablet Take 2 tablets (20 mEq total) by mouth 2 (two) times daily. Take with Torsemide 04/11/15   Rogue Bussing, MD  torsemide (DEMADEX) 20 MG tablet Take 4 tablets (80 mg total) by mouth daily. Patient not taking: Reported on 05/25/2015 05/08/15   Mercy Riding, MD   BP 106/50 mmHg  Pulse 69  Temp(Src) 98.6 F (37 C) (Oral)  Resp 18  Ht 5\' 5"  (1.651 m)  Wt 56.518 kg  BMI 20.73 kg/m2  SpO2 99% Physical Exam  Constitutional: She is oriented to person, place, and time. She appears well-developed and well-nourished. No distress.  HENT:  Head: Normocephalic and atraumatic.  Nose: Nose normal.  Mouth/Throat: Oropharynx is clear and moist.  Eyes: Conjunctivae and EOM are normal.  Pupils are equal, round, and reactive to light.  Neck: Neck supple.  Cardiovascular: Normal rate, regular rhythm, normal heart sounds and intact distal pulses.   Pulmonary/Chest: Effort normal and breath sounds normal. She has no rales.  Abdominal: Soft. She exhibits mass (in LLQ approximately baseball sized). She exhibits no distension. There is tenderness. There is no rebound and no guarding.  Musculoskeletal: She exhibits no edema or tenderness.  Neurological: She is alert and oriented to person, place, and time. No cranial nerve deficit.  Skin: Skin is warm and dry. She is not diaphoretic.  Nursing note and vitals reviewed.   ED Course  Procedures (including critical care time) Labs Review Labs Reviewed  CBC WITH DIFFERENTIAL/PLATELET - Abnormal; Notable for the following:    WBC 27.5 (*)    Hemoglobin 10.4 (*)    HCT 33.8 (*)    MCH 25.6 (*)    Platelets 470 (*)    Neutro Abs 25.3 (*)    Monocytes Absolute 1.1 (*)    All other components within normal limits  COMPREHENSIVE METABOLIC PANEL - Abnormal; Notable for the following:    Sodium 133 (*)    Potassium 3.0 (*)    Chloride 84 (*)    CO2 35 (*)    Glucose, Bld 111 (*)    BUN 37 (*)    Creatinine, Ser 1.44 (*)    Calcium 8.8 (*)    Total Protein 6.4 (*)    Albumin 2.4 (*)    GFR calc non Af Amer 31 (*)    GFR calc Af Amer 36 (*)    All other components within normal limits  URINALYSIS, ROUTINE W REFLEX MICROSCOPIC (NOT AT [facility]) - Abnormal; Notable for the following:    APPearance HAZY (*)    Hgb urine dipstick SMALL (*)    Leukocytes, UA LARGE (*)    All other components within normal limits  URINE MICROSCOPIC-ADD ON - Abnormal; Notable for the following:    Squamous Epithelial / LPF 0-5 (*)    Bacteria, UA FEW (*)    All other components within normal limits  BASIC METABOLIC PANEL - Abnormal; Notable for the following:    Sodium 132 (*)    Potassium 3.2 (*)    Chloride 85 (*)    CO2 33 (*)    Glucose, Bld  175 (*)    BUN 42 (*)    Creatinine, Ser 1.68 (*)    Calcium 8.6 (*)  GFR calc non Af Amer 26 (*)    GFR calc Af Amer 30 (*)    All other components within normal limits  CBC - Abnormal; Notable for the following:    WBC 28.3 (*)    Hemoglobin 11.0 (*)    HCT 34.6 (*)    Platelets 494 (*)    All other components within normal limits  PROTIME-INR - Abnormal; Notable for the following:    Prothrombin Time 30.7 (*)    INR 3.01 (*)    All other components within normal limits  PROTIME-INR - Abnormal; Notable for the following:    Prothrombin Time 33.0 (*)    INR 3.32 (*)    All other components within normal limits  PREALBUMIN - Abnormal; Notable for the following:    Prealbumin 6.6 (*)    All other components within normal limits  C DIFFICILE QUICK SCREEN W PCR REFLEX  LIPASE, BLOOD  LACTIC ACID, PLASMA  HEMOGLOBIN A1C  HEMOGLOBIN A1C    Imaging Review Ct Abdomen Pelvis Wo Contrast  05/25/2015  CLINICAL DATA:  Left lower quadrant pain EXAM: CT ABDOMEN AND PELVIS WITHOUT CONTRAST TECHNIQUE: Multidetector CT imaging of the abdomen and pelvis was performed following the standard protocol without IV contrast. COMPARISON:  05/02/2015 FINDINGS: Lower chest: Multi chamber cardiac enlargement is again noted. There is a moderate pericardial effusion present, similar to previous exam. No pleural effusion. Hepatobiliary: No focal liver abnormality identified on this unenhanced exam. Previous cholecystectomy. No biliary dilatation. Pancreas: No inflammation or mass identified. Spleen: The spleen appears normal. Adrenals/Urinary Tract: The adrenal glands appear normal. Bilateral renal cortical scarring and volume loss. There are bilateral renal cysts of varying density which are incompletely characterized without IV contrast material. No hydronephrosis or hydroureter. The urinary bladder appears normal. Stomach/Bowel: The stomach is normal. The small bowel loops have a normal caliber without  obstruction. The proximal colon is unremarkable. There is wall thickening and inflammation involving the sigmoid colon compatible with acute sigmoid diverticulitis. Extraluminal collection of gas and phlegmon has increased in size from previous exam now measuring 3.9 x 2.5, image 60 of series 2. Previous 2.1 x 0.7 cm. There are several additional, suspicious collections of gas and fluid within the left lower quadrant of the abdomen, for example image 68 of series 2 and image 62 of series 2. Without enteric contrast material I cannot distinguish between unopacified bowel loops and potential abscess. No pathologic dilatation of the bowel loops. Vascular/Lymphatic: Calcified atherosclerotic disease involves the abdominal aorta. No aneurysm. No enlarged retroperitoneal or mesenteric adenopathy. No enlarged pelvic or inguinal lymph nodes. Reproductive: The uterus and the adnexal structures are unremarkable. Other: There is a small amount of fluid overlying the anterior liver. Free fluid also noted within the left lower quadrant of the abdomen and tracking along the left pericolic gutter. Musculoskeletal: Degenerative disc disease noted within the lumbar spine. IMPRESSION: 1. There are persistent inflammatory changes of diverticulitis involving the sigmoid colon. 2. Previously noted extra luminal collection of gas is again noted and is increased in size from previous exam, worrisome for abscess. Additionally, there are several additional suspicious collections of fluid and gas which may represent abscesses, but without enteric contrast material high cannot distinguish this from on opacified bowel loops. A repeat examination with IV, oral and rectal contrast may be helpful to assess for extent of left lower quadrant diverticular abscess formation. 3. Cardiac enlargement and pericardial effusion. 4. Aortic atherosclerosis. Electronically Signed   By: Queen Slough.D.  On: 05/25/2015 18:15   I have personally reviewed  and evaluated these images and lab results as part of my medical decision-making.   EKG Interpretation None      MDM  82 y.o. female with known diverticulitis with contained perforation presents to the ED from her PCP's office noting persistent sx despite medical mgmt. On exam she is ill appearing, laying in the fetal position appearing uncomfortable, but in NAD. AF, with VSS. Abdominal exam finds large tender mass within the LLQ. Given reports of progressive worsening and increasing size concern raised for developing abscess within her previously contained perforation. Labs returned very significant with a leukocytosis of 27.5, Cr 1.44, BUN 37, normal lactate and lipase. Repeat CT scan was done and was concerning for worsening fluid collections concerning for multiple intraabdominal abscesses with increased extraluminal gas. Her CT scan was slightly limited therein without contrast due to contrast allergy. She was given a dose of zosyn for intraabdominal abscess. Her case was discussed with general surgery who agreed to see the patient at the bedside. The patient was then admitted the family medicine service for further care and assessment. This plan was discussed with the patient at the bedside and she stated both understanding and agreement.   Final diagnoses:  Diverticulitis of large intestine with perforation and abscess without bleeding       Zenovia Jarred, DO 05/26/15 1157  Elnora Morrison, MD 05/26/15 2112

## 2015-05-25 NOTE — ED Notes (Signed)
This RN spoke with Resident concerning fluid bolus. Pt has history of chf, changed bolus to 500 instead of 1000

## 2015-05-25 NOTE — Progress Notes (Signed)
ANTICOAGULATION CONSULT NOTE - Initial Consult  Pharmacy Consult for Coumadin  Indication: chronic atrial fibrillation    ANTIBIOTIC CONSULT NOTE - INITIAL  Pharmacy Consult for Zosyn Indication : Intra-abdominal infection   Allergies  Allergen Reactions  . Anti-Inflammatory Enzyme [Nutritional Supplements] Other (See Comments)    Retains fluids and headaches  . Iodinated Diagnostic Agents Hives    HIVES 15MIN S/P IV CONTRAST INJECTION,WILL NEED 13 HR PREP FOR FUTURE INJECTIONS, ok s/p 50mg  po benadryl//a.calhoun  . Spironolactone Other (See Comments)    Hair loss  . Arthrotec [Diclofenac-Misoprostol] Other (See Comments)    unknown  . Biaxin [Clarithromycin] Other (See Comments)    unknown  . Plavix [Clopidogrel Bisulfate] Other (See Comments)    unknown    Patient Measurements: Height: 5\' 5"  (165.1 cm) (05/25/15 @ 1352) Weight: 124 lb 9.6 oz (56.518 kg) (05/25/15 @ 1352) IBW/kg (Calculated) : 57 kg   Vital Signs: Temp: 98.1 F (36.7 C) (04/29 1520) Temp Source: Oral (04/29 1520) BP: 107/72 mmHg (04/29 2030) Pulse Rate: 68 (04/29 2030)  Labs:  Recent Labs  05/25/15 1639  HGB 10.4*  HCT 33.8*  PLT 470*  CREATININE 1.44*    Estimated Creatinine Clearance: 23.2 mL/min (by C-G formula based on Cr of 1.44).    Recent Labs Lab 05/25/15 1639  WBC 27.5*  CREATININE 1.44*  LATICACIDVEN 1.5     Medical History: Past Medical History  Diagnosis Date  . Pericardial effusion     a. s/p pericardial window 01/16/15  . History of GI bleed     a. secondary to AVM's. Treated with Fe infusion  . HTN (hypertension)   . Obesity   . LVH (left ventricular hypertrophy)   . Sleep apnea     mild-no cpap  . Positive TB test   . Anemia   . Stroke [facility])     a. 2013: right frontal  . Arthritis   . Tachycardia-bradycardia syndrome [facility])     a. s/p Medtronic [place], model number [id number], serial number [id number] H 12/2013  . History of shingles   . Chronic diastolic  (congestive) heart failure ([place])     a. Echo 01/2015 with EF of 55-60%.  . Chronic atrial fibrillation (HCC)     a. on Coumadin, Digoxin, and Verapamil    Medications:  Prescriptions prior to admission  Medication Sig Dispense Refill Last Dose  . Digoxin 62.5 MCG TABS Take 0.0625 mg by mouth daily. Take only 5 days a week; skip Wednesdays and Sundays 30 tablet 6 05/24/2015 at Unknown time  . oxyCODONE (ROXICODONE) 5 MG immediate release tablet Take one half tablet in the morning as needed for back pain. (Patient taking differently: Take 2.5 mg by mouth daily as needed for severe pain. Take one half tablet in the morning as needed for back pain.) 20 tablet 0 Past Month at Unknown time  . verapamil (CALAN-SR) 180 MG CR tablet take 1 tablet by mouth at bedtime (Patient taking differently: take 1 tablet by mouth ONCE DAILY) 30 tablet 11 05/24/2015 at Unknown time  . warfarin (COUMADIN) 5 MG tablet Take 2.5-5 mg by mouth as directed. Takes 0.5 tablet (2.5 mg total) by mouth Monday, Wednesday, Friday, Saturday. Take 1 tablet (5 mg total) by mouth Sunday, Tuesday, Thursday.   05/24/2015 at Unknown time  . amoxicillin-clavulanate (AUGMENTIN) 500-125 MG tablet Take 1 tablet (500 mg total) by mouth every 12 (twelve) hours. Start tonight. (Patient not taking: Reported on 05/25/2015) 10 tablet 0 Not Taking  .  gabapentin (NEURONTIN) 100 MG capsule Take 1 capsule (100 mg total) by mouth 3 (three) times daily. 90 capsule 3 Taking  . metolazone (ZAROXOLYN) 2.5 MG tablet Take 1 tablet (2.5 mg total) by mouth daily. (Patient not taking: Reported on 05/25/2015) 30 tablet 0 ON HOLD  . ondansetron (ZOFRAN ODT) 4 MG disintegrating tablet Take 1 tablet (4 mg total) by mouth every 8 (eight) hours as needed for nausea or vomiting. (Patient not taking: Reported on 05/25/2015) 20 tablet 0 Not Taking  . potassium chloride (K-DUR,KLOR-CON) 10 MEQ tablet Take 2 tablets (20 mEq total) by mouth 2 (two) times daily. Take with Torsemide  120 tablet 0 ON HOLD  . torsemide (DEMADEX) 20 MG tablet Take 4 tablets (80 mg total) by mouth daily. (Patient not taking: Reported on 05/25/2015) 120 tablet 0 ON HOLD    Assessment: 82 y.o female presenting with LLQ abdominal pain. Admitted with recurrent diverticulitis with possible perforation / abscess. She is on  coumadin for CAF .  PTA coumadin dose is 2.5 mg qMWF and 5mg  qTTSun, last taken PTA on 05/24/15.  hgb 10.4, pltc 470K INR not done on admission today. I will order STAT INR. She has a h/o GI bleeding with hemorrhoid and recurrent diverticulitis.   Zosyn 3.375 gm IV x1 given in ED @19 :30 SCr 1.44 , CrCl ~ 23 ml/min  Goal of Therapy:  INR 2-3 Monitor platelets by anticoagulation protocol: Yes   Plan:  F/u STAT INR then dose Coumadin tonight.  Daily INR  Zosyn 3.375 gm IV q8h (infuse dose over 4 hrs)  Monitor clinical status, renal function & adjust zosyn if needed.  Nicole Cella, RPh Clinical Pharmacist Pager: [phone] 05/25/2015,9:33 PM

## 2015-05-25 NOTE — Consult Note (Signed)
Reason for Consult: Diverticulitis with microperforation Referring Physician: Dr. Esmond Camper  [name] is an 82 y.o. female.  HPI: This is a 82 year old female who is well known to [facility] having several recent admissions for diverticulitis. Her last admission several weeks ago was for sigmoid diverticulitis with microperforation.  She has been treated conservatively with antibiotics. She still has ongoing left lower quadrant abdominal pain with weakness. She has been constipated. She has failure to thrive. Her appetite is been very poor. She has had no emesis. A repeat CAT scan of the abdomen and pelvis today without contrast shows worsening of the diverticulitis and microperforation.  Past Medical History  Diagnosis Date  . Pericardial effusion     a. s/p pericardial window 01/16/15  . History of GI bleed     a. secondary to AVM's. Treated with Fe infusion  . HTN (hypertension)   . Obesity   . LVH (left ventricular hypertrophy)   . Sleep apnea     mild-no cpap  . Positive TB test   . Anemia   . Stroke [facility])     a. 2013: right frontal  . Arthritis   . Tachycardia-bradycardia syndrome [facility])     a. s/p Medtronic [place], model number [id number], serial number [id number] H 12/2013  . History of shingles   . Chronic diastolic (congestive) heart failure ([place])     a. Echo 01/2015 with EF of 55-60%.  . Chronic atrial fibrillation (HCC)     a. on Coumadin, Digoxin, and Verapamil    Past Surgical History  Procedure Laterality Date  . Breast lumpectomy Bilateral     negative for cancer  . Esophagogastroduodenoscopy  05/19/2011    Procedure: ESOPHAGOGASTRODUODENOSCOPY (EGD);  Surgeon: Lear Ng, MD;  Location: [facility] ENDOSCOPY;  Service: Endoscopy;  Laterality: N/A;  doctor aware of inr   will try to be here no later than 230  . Cholecystectomy  2005  . Esophagogastroduodenoscopy  06/22/2011    Procedure: ESOPHAGOGASTRODUODENOSCOPY (EGD);  Surgeon: Arta Silence, MD;  Location: [facility]  ENDOSCOPY;  Service: Endoscopy;  Laterality: N/A;  Check PT/INR in am  . Givens capsule study  06/23/2011    Procedure: GIVENS CAPSULE STUDY;  Surgeon: Arta Silence, MD;  Location: [facility] ENDOSCOPY;  Service: Endoscopy;  Laterality: N/A;  . Colonoscopy  08/11/2011    Procedure: COLONOSCOPY;  Surgeon: Jeryl Columbia, MD;  Location: [facility] ENDOSCOPY;  Service: Endoscopy;  Laterality: N/A;  . Hot hemostasis  08/11/2011    Procedure: HOT HEMOSTASIS (ARGON PLASMA COAGULATION/BICAP);  Surgeon: Jeryl Columbia, MD;  Location: [facility] ENDOSCOPY;  Service: Endoscopy;  Laterality: N/A;  . Mass excision Left 09/14/2013    Procedure: EXCISION MASS LEFT WRIST;  Surgeon: Leanora Cover, MD;  Location: [place];  Service: Orthopedics;  Laterality: Left;  . Tonsillectomy and adenoidectomy  1950  . Insert / replace / remove pacemaker  2001    Last generator in 2006; interrogated Dec 2012  . Cataract extraction w/ intraocular lens  implant, bilateral Bilateral   . Lipoma excision Left 07/2013    wrist  . Pacemaker generator change N/A 01/02/2014    Procedure: PACEMAKER GENERATOR CHANGE;  Surgeon: Sanda Klein, MD;  Location: [place] CATH LAB;  Service: Cardiovascular;  Laterality: N/A;  . Lead revision N/A 01/02/2014    Procedure: LEAD REVISION;  Surgeon: Sanda Klein, MD;  Location: [place] CATH LAB;  Service: Cardiovascular;  Laterality: N/A;  . Subxyphoid pericardial window N/A 01/16/2015    Procedure: SUBXYPHOID PERICARDIAL WINDOW;  Surgeon: Grace Isaac, MD;  Location: [place];  Service: Thoracic;  Laterality: N/A;  . Tee without cardioversion N/A 01/16/2015    Procedure: TRANSESOPHAGEAL ECHOCARDIOGRAM (TEE);  Surgeon: Grace Isaac, MD;  Location: [facility] OR;  Service: Thoracic;  Laterality: N/A;    Family History  Problem Relation Age of Onset  . Stroke Mother   . Stroke Father   . Pneumonia Father   . Colon cancer Sister   . Colon cancer Daughter     Social History:  reports that she has never smoked. She  has never used smokeless tobacco. She reports that she drinks alcohol. She reports that she does not use illicit drugs.  Allergies:  Allergies  Allergen Reactions  . Anti-Inflammatory Enzyme [Nutritional Supplements] Other (See Comments)    Retains fluids and headaches  . Iodinated Diagnostic Agents Hives    HIVES 15MIN S/P IV CONTRAST INJECTION,WILL NEED 13 HR PREP FOR FUTURE INJECTIONS, ok s/p 36m po benadryl//a.calhoun  . Spironolactone Other (See Comments)    Hair loss  . Arthrotec [Diclofenac-Misoprostol] Other (See Comments)    unknown  . Biaxin [Clarithromycin] Other (See Comments)    unknown  . Plavix [Clopidogrel Bisulfate] Other (See Comments)    unknown    Medications: I have reviewed the patient's current medications.  Results for orders placed or performed during the hospital encounter of 05/25/15 (from the past 48 hour(s))  CBC with Differential     Status: Abnormal   Collection Time: 05/25/15  4:39 PM  Result Value Ref Range   WBC 27.5 (H) 4.0 - 10.5 K/uL    Comment: REPEATED TO VERIFY WHITE COUNT CONFIRMED ON SMEAR    RBC 4.06 3.87 - 5.11 MIL/uL   Hemoglobin 10.4 (L) 12.0 - 15.0 g/dL   HCT 33.8 (L) 36.0 - 46.0 %   MCV 83.3 78.0 - 100.0 fL   MCH 25.6 (L) 26.0 - 34.0 pg   MCHC 30.8 30.0 - 36.0 g/dL   RDW 15.2 11.5 - 15.5 %   Platelets 470 (H) 150 - 400 K/uL   Neutrophils Relative % 92 %   Lymphocytes Relative 4 %   Monocytes Relative 4 %   Eosinophils Relative 0 %   Basophils Relative 0 %   Neutro Abs 25.3 (H) 1.7 - 7.7 K/uL   Lymphs Abs 1.1 0.7 - 4.0 K/uL   Monocytes Absolute 1.1 (H) 0.1 - 1.0 K/uL   Eosinophils Absolute 0.0 0.0 - 0.7 K/uL   Basophils Absolute 0.0 0.0 - 0.1 K/uL   Smear Review LARGE PLATELETS PRESENT   Comprehensive metabolic panel     Status: Abnormal   Collection Time: 05/25/15  4:39 PM  Result Value Ref Range   Sodium 133 (L) 135 - 145 mmol/L   Potassium 3.0 (L) 3.5 - 5.1 mmol/L   Chloride 84 (L) 101 - 111 mmol/L   CO2 35 (H)  22 - 32 mmol/L   Glucose, Bld 111 (H) 65 - 99 mg/dL   BUN 37 (H) 6 - 20 mg/dL   Creatinine, Ser 1.44 (H) 0.44 - 1.00 mg/dL   Calcium 8.8 (L) 8.9 - 10.3 mg/dL   Total Protein 6.4 (L) 6.5 - 8.1 g/dL   Albumin 2.4 (L) 3.5 - 5.0 g/dL   AST 27 15 - 41 U/L   ALT 21 14 - 54 U/L   Alkaline Phosphatase 109 38 - 126 U/L   Total Bilirubin 0.8 0.3 - 1.2 mg/dL   GFR calc non Af Amer 31 (L) >  60 mL/min   GFR calc Af Amer 36 (L) >60 mL/min    Comment: (NOTE) The eGFR has been calculated using the CKD EPI equation. This calculation has not been validated in all clinical situations. eGFR's persistently <60 mL/min signify possible Chronic Kidney Disease.    Anion gap 14 5 - 15  Lipase, blood     Status: None   Collection Time: 05/25/15  4:39 PM  Result Value Ref Range   Lipase 25 11 - 51 U/L  Lactic acid, plasma     Status: None   Collection Time: 05/25/15  4:39 PM  Result Value Ref Range   Lactic Acid, Venous 1.5 0.5 - 2.0 mmol/L  Urinalysis, Routine w reflex microscopic (not at [facility])     Status: Abnormal   Collection Time: 05/25/15  6:07 PM  Result Value Ref Range   Color, Urine YELLOW YELLOW    Comment: LESS THAN 10 mL OF URINE SUBMITTED MICROSCOPIC EXAM PERFORMED ON UNCONCENTRATED URINE    APPearance HAZY (A) CLEAR   Specific Gravity, Urine 1.010 1.005 - 1.030   pH 5.5 5.0 - 8.0   Glucose, UA NEGATIVE NEGATIVE mg/dL   Hgb urine dipstick SMALL (A) NEGATIVE   Bilirubin Urine NEGATIVE NEGATIVE   Ketones, ur NEGATIVE NEGATIVE mg/dL   Protein, ur NEGATIVE NEGATIVE mg/dL   Nitrite NEGATIVE NEGATIVE   Leukocytes, UA LARGE (A) NEGATIVE  Urine microscopic-add on     Status: Abnormal   Collection Time: 05/25/15  6:07 PM  Result Value Ref Range   Squamous Epithelial / LPF 0-5 (A) NONE SEEN   WBC, UA 0-5 0 - 5 WBC/hpf   RBC / HPF 0-5 0 - 5 RBC/hpf   Bacteria, UA FEW (A) NONE SEEN    Ct Abdomen Pelvis Wo Contrast  05/25/2015  CLINICAL DATA:  Left lower quadrant pain EXAM: CT ABDOMEN AND  PELVIS WITHOUT CONTRAST TECHNIQUE: Multidetector CT imaging of the abdomen and pelvis was performed following the standard protocol without IV contrast. COMPARISON:  05/02/2015 FINDINGS: Lower chest: Multi chamber cardiac enlargement is again noted. There is a moderate pericardial effusion present, similar to previous exam. No pleural effusion. Hepatobiliary: No focal liver abnormality identified on this unenhanced exam. Previous cholecystectomy. No biliary dilatation. Pancreas: No inflammation or mass identified. Spleen: The spleen appears normal. Adrenals/Urinary Tract: The adrenal glands appear normal. Bilateral renal cortical scarring and volume loss. There are bilateral renal cysts of varying density which are incompletely characterized without IV contrast material. No hydronephrosis or hydroureter. The urinary bladder appears normal. Stomach/Bowel: The stomach is normal. The small bowel loops have a normal caliber without obstruction. The proximal colon is unremarkable. There is wall thickening and inflammation involving the sigmoid colon compatible with acute sigmoid diverticulitis. Extraluminal collection of gas and phlegmon has increased in size from previous exam now measuring 3.9 x 2.5, image 60 of series 2. Previous 2.1 x 0.7 cm. There are several additional, suspicious collections of gas and fluid within the left lower quadrant of the abdomen, for example image 68 of series 2 and image 62 of series 2. Without enteric contrast material I cannot distinguish between unopacified bowel loops and potential abscess. No pathologic dilatation of the bowel loops. Vascular/Lymphatic: Calcified atherosclerotic disease involves the abdominal aorta. No aneurysm. No enlarged retroperitoneal or mesenteric adenopathy. No enlarged pelvic or inguinal lymph nodes. Reproductive: The uterus and the adnexal structures are unremarkable. Other: There is a small amount of fluid overlying the anterior liver. Free fluid also noted  within  the left lower quadrant of the abdomen and tracking along the left pericolic gutter. Musculoskeletal: Degenerative disc disease noted within the lumbar spine. IMPRESSION: 1. There are persistent inflammatory changes of diverticulitis involving the sigmoid colon. 2. Previously noted extra luminal collection of gas is again noted and is increased in size from previous exam, worrisome for abscess. Additionally, there are several additional suspicious collections of fluid and gas which may represent abscesses, but without enteric contrast material high cannot distinguish this from on opacified bowel loops. A repeat examination with IV, oral and rectal contrast may be helpful to assess for extent of left lower quadrant diverticular abscess formation. 3. Cardiac enlargement and pericardial effusion. 4. Aortic atherosclerosis. Electronically Signed   By: Kerby Moors M.D.   On: 05/25/2015 18:15    Review of Systems  All other systems reviewed and are negative.  Blood pressure 107/72, pulse 68, temperature 98.1 F (36.7 C), temperature source Oral, resp. rate 17, SpO2 98 %. Physical Exam  Constitutional: She appears well-developed and well-nourished. No distress.  Cardiovascular: Normal rate.   Irregular rhythm  Respiratory: Effort normal and breath sounds normal.  GI: Soft. There is tenderness. There is guarding.  Her abdomen is soft there is significant tenderness with guarding in the left lower quadrant.  Neurological: She is alert.  Skin: She is not diaphoretic.  Psychiatric: Her behavior is normal. Judgment normal.    Assessment/Plan: Progressive sigmoid diverticulitis with microperforation and possible abscess formation.  She has failed conservative management. She is being admitted to the hospital for bowel rest and IV antibiotics. We will discuss whether proceeding with a CAT scan with oral and rectal contrast is reasonable to see if there is a definable abscess the interventional  radiology could potentially drain. Regardless, I do believe that she will need an eventual resection with colostomy and would probably not be able to undergo a re-anastomosis at the time of surgery given the amount of inflammation and her overall deconditioning and poor nutrition status. I recommend a PICC line be placed tomorrow and that she be started on TNA. We will follow her closely. Her last INR done in the outpatient lab 3 days ago was 7.1. It will need to be repeated. Hopefully, we can let this slowly correct depending on the number without having to reverse her emergently.  Kamalani Mastro A 05/25/2015, 9:00 PM

## 2015-05-25 NOTE — ED Notes (Signed)
Pt here for abd pain, diverticulitis. sts baseball size knot in LLQ. Tender to palpate and pain with BM and been there for a few months. sts getting bigger.

## 2015-05-25 NOTE — Progress Notes (Signed)
By signing my name below, I, [name], attest that this documentation has been prepared under the direction and in the presence of [name] Queen, MD. Electronically Signed: Moises [name], Brenda. 05/25/2015 , 2:23 PM .  Patient was seen in room 1 .  Chief Complaint:  Chief Complaint  Patient presents with  . Follow-up    Diverticulitis  . Abdominal Pain  . Extremity Weakness  . Fatigue  . Anorexia    HPI: [name] is a 82 y.o. female who reports to [facility] today for follow up. She informs still having abdominal pain, weakness, fatigue and anorexia. Patient states continuously feeling bad since December 2016. She had CT abdomen pelvis w/o contrast on 05/02/15 that showed a new contained perforation into the sigmoid mesentery. She was recommended for surgery. She hasn't been able to eat in the past 2-3 days and reports losing about 20 lbs. She's been treated multiple times with IV fluid and antibiotics without improvement. She denies fever, chills, or vomiting.   Patient has been hospitalized 3 times already in the past 3 months for diverticulitis.   Past Medical History  Diagnosis Date  . Pericardial effusion     a. s/p pericardial window 01/16/15  . History of GI bleed     a. secondary to AVM's. Treated with Fe infusion  . HTN (hypertension)   . Obesity   . LVH (left ventricular hypertrophy)   . Sleep apnea     mild-no cpap  . Positive TB test   . Anemia   . Stroke [facility])     a. 2013: right frontal  . Arthritis   . Tachycardia-bradycardia syndrome [facility])     a. s/p Medtronic [place], model number [id number], serial number [id number] H 12/2013  . History of shingles   . Chronic diastolic (congestive) heart failure ([place])     a. Echo 01/2015 with EF of 55-60%.  . Chronic atrial fibrillation (HCC)     a. on Coumadin, Digoxin, and Verapamil   Past Surgical History  Procedure Laterality Date  . Breast lumpectomy Bilateral     negative for cancer  .  Esophagogastroduodenoscopy  05/19/2011    Procedure: ESOPHAGOGASTRODUODENOSCOPY (EGD);  Surgeon: Lear Ng, MD;  Location: [facility] ENDOSCOPY;  Service: Endoscopy;  Laterality: N/A;  doctor aware of inr   will try to be here no later than 230  . Cholecystectomy  2005  . Esophagogastroduodenoscopy  06/22/2011    Procedure: ESOPHAGOGASTRODUODENOSCOPY (EGD);  Surgeon: Arta Silence, MD;  Location: [facility] ENDOSCOPY;  Service: Endoscopy;  Laterality: N/A;  Check PT/INR in am  . Givens capsule study  06/23/2011    Procedure: GIVENS CAPSULE STUDY;  Surgeon: Arta Silence, MD;  Location: [facility] ENDOSCOPY;  Service: Endoscopy;  Laterality: N/A;  . Colonoscopy  08/11/2011    Procedure: COLONOSCOPY;  Surgeon: Jeryl Columbia, MD;  Location: [facility] ENDOSCOPY;  Service: Endoscopy;  Laterality: N/A;  . Hot hemostasis  08/11/2011    Procedure: HOT HEMOSTASIS (ARGON PLASMA COAGULATION/BICAP);  Surgeon: Jeryl Columbia, MD;  Location: [facility] ENDOSCOPY;  Service: Endoscopy;  Laterality: N/A;  . Mass excision Left 09/14/2013    Procedure: EXCISION MASS LEFT WRIST;  Surgeon: Leanora Cover, MD;  Location: [place];  Service: Orthopedics;  Laterality: Left;  . Tonsillectomy and adenoidectomy  1950  . Insert / replace / remove pacemaker  2001    Last generator in 2006; interrogated Dec 2012  . Cataract extraction w/ intraocular lens  implant, bilateral Bilateral   .  Lipoma excision Left 07/2013    wrist  . Pacemaker generator change N/A 01/02/2014    Procedure: PACEMAKER GENERATOR CHANGE;  Surgeon: Sanda Klein, MD;  Location: [place] CATH LAB;  Service: Cardiovascular;  Laterality: N/A;  . Lead revision N/A 01/02/2014    Procedure: LEAD REVISION;  Surgeon: Sanda Klein, MD;  Location: [place] CATH LAB;  Service: Cardiovascular;  Laterality: N/A;  . Subxyphoid pericardial window N/A 01/16/2015    Procedure: SUBXYPHOID PERICARDIAL WINDOW;  Surgeon: Grace Isaac, MD;  Location: [place];  Service: Thoracic;  Laterality: N/A;  .  Tee without cardioversion N/A 01/16/2015    Procedure: TRANSESOPHAGEAL ECHOCARDIOGRAM (TEE);  Surgeon: Grace Isaac, MD;  Location: [place];  Service: Thoracic;  Laterality: N/A;   Social History   Social History  . Marital Status: Widowed    Spouse Name: N/A  . Number of Children: N/A  . Years of Education: N/A   Social History Main Topics  . Smoking status: Never Smoker   . Smokeless tobacco: Never Used  . Alcohol Use: 0.0 oz/week    0 Standard drinks or equivalent per week     Comment: 01/02/2014 "I'll have a drink a few times/year"  . Drug Use: No  . Sexual Activity: No   Other Topics Concern  . None   Social History Narrative   Family History  Problem Relation Age of Onset  . Stroke Mother   . Stroke Father   . Pneumonia Father   . Colon cancer Sister   . Colon cancer Daughter    Allergies  Allergen Reactions  . Iodinated Diagnostic Agents Hives    HIVES 15MIN S/P IV CONTRAST INJECTION,WILL NEED 13 HR PREP FOR FUTURE INJECTIONS, ok s/p 50mg  po benadryl//a.calhoun  . Anti-Inflammatory Enzyme [Nutritional Supplements]     Retains fluids and headaches  . Arthrotec [Diclofenac-Misoprostol] Other (See Comments)    unknown  . Biaxin [Clarithromycin] Other (See Comments)    unknown  . Plavix [Clopidogrel Bisulfate] Other (See Comments)    unknown  . Spironolactone     Hair loss   Prior to Admission medications   Medication Sig Start Date End Date Taking? Authorizing Provider  Digoxin 62.5 MCG TABS Take 0.0625 mg by mouth daily. Take only 5 days a week; skip Wednesdays and Sundays 03/20/15  Yes Mihai Croitoru, MD  gabapentin (NEURONTIN) 100 MG capsule Take 1 capsule (100 mg total) by mouth 3 (three) times daily. 04/23/15  Yes Darlyne Russian, MD  potassium chloride (K-DUR,KLOR-CON) 10 MEQ tablet Take 2 tablets (20 mEq total) by mouth 2 (two) times daily. Take with Torsemide 04/11/15  Yes Hillary Corinda Gubler, MD  verapamil (CALAN-SR) 180 MG CR tablet take 1 tablet by  mouth at bedtime 03/26/15  Yes Mihai Croitoru, MD  amoxicillin-clavulanate (AUGMENTIN) 500-125 MG tablet Take 1 tablet (500 mg total) by mouth every 12 (twelve) hours. Start tonight. Patient not taking: Reported on 05/25/2015 05/11/15   Darlyne Russian, MD  hydrocortisone-pramoxine [facility]) 2.5-1 % rectal cream Place 1 application rectally 3 (three) times daily. Reported on 05/25/2015    Historical Provider, MD  metolazone (ZAROXOLYN) 2.5 MG tablet Take 1 tablet (2.5 mg total) by mouth daily. Patient not taking: Reported on 05/25/2015 05/08/15   Mercy Riding, MD  ondansetron (ZOFRAN ODT) 4 MG disintegrating tablet Take 1 tablet (4 mg total) by mouth every 8 (eight) hours as needed for nausea or vomiting. Patient not taking: Reported on 05/25/2015 03/09/15   Lorna Few, DO  oxyCODONE (ROXICODONE) 5 MG immediate release tablet Take one half tablet in the morning as needed for back pain. Patient not taking: Reported on 05/25/2015 05/11/15   Darlyne Russian, MD  torsemide (DEMADEX) 20 MG tablet Take 4 tablets (80 mg total) by mouth daily. Patient not taking: Reported on 05/25/2015 05/08/15   Mercy Riding, MD  warfarin (COUMADIN) 5 MG tablet Take 2.5-5 mg by mouth as directed. Takes 0.5 tablet (2.5 mg total) by mouth Monday, Wednesday, Friday, Saturday. Take 1 tablet (5 mg total) by mouth Sunday, Tuesday, Thursday.    Historical Provider, MD     ROS:  Constitutional: negative for fever, chills, night sweats, weight changes; positive for fatigue  HEENT: negative for vision changes, hearing loss, congestion, rhinorrhea, ST, epistaxis, or sinus pressure Cardiovascular: negative for chest pain or palpitations Respiratory: negative for hemoptysis, wheezing, shortness of breath, or cough Abdominal: negative for vomiting, diarrhea, or constipation; positive for abdominal pain Dermatological: negative for rash Neurologic: negative for headache, dizziness, or syncope; positive for weakness All other systems  reviewed and are otherwise negative with the exception to those above and in the HPI.  PHYSICAL EXAM: Filed Vitals:   05/25/15 1352  BP: 110/50  Pulse: 90  Temp: 98.9 F (37.2 C)  Resp: 16   Body mass index is 20.73 kg/(m^2).   General: Ill appearing, fetal position in room HEENT:  Normocephalic, atraumatic, oropharynx patent. Eye: [facility] Cardiovascular:  irregular with 2/6 systolic murmur, pacemaker present. No Carotid bruits, radial pulse intact. No pedal edema.  Respiratory: Clear to auscultation bilaterally.  No wheezes, rales, or rhonchi.  No cyanosis, no use of accessory musculature Abdominal: a grapefruit-sized mass over LLQ that is very tender to palpation Musculoskeletal: Gait intact. No edema, tenderness Skin: No rashes or hands and feet were cool to touch with poor perfusion.. Neurologic: Facial musculature symmetric. Psychiatric: Patient acts appropriately throughout our interaction.  Lymphatic: No cervical or submandibular lymphadenopathy Genitourinary/Anorectal: No acute findings LABS:   EKG/XRAY:     ASSESSMENT/PLAN: Patient presents with recurrent LLQ abdominal pain. She's had 4 hospital admissions this year; antibiotics for presumed diverticulitis, and scans have shown small abscess formation and perforation. She did not respond to conservative treatment. She understands risk of surgical removal of LLQ mass. She understands she has not improved with antibiotics therapy. She will discuss this with attending physician and surgeon at the time of consultation. Of note her hands and feet were cool to touch consistent with poor perfusion.  Gross sideeffects, risk and benefits, and alternatives of medications d/w patient. Patient is aware that all medications have potential sideeffects and we are unable to predict every sideeffect or drug-drug interaction that may occur.  [name] Queen MD 05/25/2015 3:04 PM

## 2015-05-26 ENCOUNTER — Inpatient Hospital Stay (HOSPITAL_COMMUNITY): Payer: Medicare Other | Admitting: Anesthesiology

## 2015-05-26 DIAGNOSIS — Z7901 Long term (current) use of anticoagulants: Secondary | ICD-10-CM

## 2015-05-26 DIAGNOSIS — E440 Moderate protein-calorie malnutrition: Secondary | ICD-10-CM

## 2015-05-26 DIAGNOSIS — N183 Chronic kidney disease, stage 3 unspecified: Secondary | ICD-10-CM

## 2015-05-26 DIAGNOSIS — I482 Chronic atrial fibrillation: Secondary | ICD-10-CM

## 2015-05-26 DIAGNOSIS — H9190 Unspecified hearing loss, unspecified ear: Secondary | ICD-10-CM

## 2015-05-26 LAB — GLUCOSE, CAPILLARY
GLUCOSE-CAPILLARY: 154 mg/dL — AB (ref 65–99)
GLUCOSE-CAPILLARY: 158 mg/dL — AB (ref 65–99)
GLUCOSE-CAPILLARY: 87 mg/dL (ref 65–99)

## 2015-05-26 LAB — BASIC METABOLIC PANEL
ANION GAP: 14 (ref 5–15)
BUN: 42 mg/dL — ABNORMAL HIGH (ref 6–20)
CO2: 33 mmol/L — ABNORMAL HIGH (ref 22–32)
Calcium: 8.6 mg/dL — ABNORMAL LOW (ref 8.9–10.3)
Chloride: 85 mmol/L — ABNORMAL LOW (ref 101–111)
Creatinine, Ser: 1.68 mg/dL — ABNORMAL HIGH (ref 0.44–1.00)
GFR, EST AFRICAN AMERICAN: 30 mL/min — AB (ref >60)
GFR, EST NON AFRICAN AMERICAN: 26 mL/min — AB (ref >60)
Glucose, Bld: 175 mg/dL — ABNORMAL HIGH (ref 65–99)
POTASSIUM: 3.2 mmol/L — AB (ref 3.5–5.1)
SODIUM: 132 mmol/L — AB (ref 135–145)

## 2015-05-26 LAB — CBC
HCT: 34.6 % — ABNORMAL LOW (ref 36.0–46.0)
HEMOGLOBIN: 11 g/dL — AB (ref 12.0–15.0)
MCH: 26.2 pg (ref 26.0–34.0)
MCHC: 31.8 g/dL (ref 30.0–36.0)
MCV: 82.4 fL (ref 78.0–100.0)
Platelets: 494 10*3/uL — ABNORMAL HIGH (ref 150–400)
RBC: 4.2 MIL/uL (ref 3.87–5.11)
RDW: 15.3 % (ref 11.5–15.5)
WBC: 28.3 10*3/uL — AB (ref 4.0–10.5)

## 2015-05-26 LAB — PROTIME-INR
INR: 3.32 — ABNORMAL HIGH (ref 0.00–1.49)
PROTHROMBIN TIME: 33 s — AB (ref 11.6–15.2)

## 2015-05-26 LAB — PREALBUMIN: Prealbumin: 6.6 mg/dL — ABNORMAL LOW (ref 18–38)

## 2015-05-26 LAB — C DIFFICILE QUICK SCREEN W PCR REFLEX
C Diff antigen: NEGATIVE
C Diff toxin: POSITIVE — AB

## 2015-05-26 LAB — CLOSTRIDIUM DIFFICILE BY PCR: Toxigenic C. Difficile by PCR: POSITIVE — AB

## 2015-05-26 MED ORDER — ALVIMOPAN 12 MG PO CAPS: 12.0000 mg | ORAL_CAPSULE | ORAL | Status: DC

## 2015-05-26 MED ORDER — VERAPAMIL HCL ER 120 MG PO TBCR
120.0000 mg | EXTENDED_RELEASE_TABLET | Freq: Every day | ORAL | Status: DC
  Administered 2015-05-26 – 2015-06-06 (×10): 120 mg via ORAL
  Administered 2015-06-07: 60 mg via ORAL
  Administered 2015-06-08 – 2015-06-14 (×6): 120 mg via ORAL
  Filled 2015-05-26 (×2): qty 1
  Filled 2015-05-26: qty 0.5
  Filled 2015-05-26 (×9): qty 1
  Filled 2015-05-26: qty 0.5
  Filled 2015-05-26 (×6): qty 1
  Filled 2015-05-26: qty 0.5
  Filled 2015-05-26 (×2): qty 1

## 2015-05-26 MED ORDER — POTASSIUM CHLORIDE CRYS ER 20 MEQ PO TBCR
30.0000 meq | EXTENDED_RELEASE_TABLET | Freq: Two times a day (BID) | ORAL | Status: DC
  Administered 2015-05-26 – 2015-05-30 (×8): 30 meq via ORAL
  Filled 2015-05-26 (×9): qty 1

## 2015-05-26 MED ORDER — LIP MEDEX EX OINT: 1.0000 "application " | TOPICAL_OINTMENT | Freq: Two times a day (BID) | CUTANEOUS | Status: DC

## 2015-05-26 MED ORDER — INSULIN ASPART 100 UNIT/ML ~~LOC~~ SOLN
0.0000 [IU] | Freq: Three times a day (TID) | SUBCUTANEOUS | Status: DC
  Administered 2015-05-26 (×2): 2 [IU] via SUBCUTANEOUS
  Administered 2015-05-27 – 2015-05-30 (×7): 1 [IU] via SUBCUTANEOUS
  Administered 2015-05-31: 3 [IU] via SUBCUTANEOUS
  Administered 2015-06-01 (×2): 2 [IU] via SUBCUTANEOUS

## 2015-05-26 MED ORDER — DEXTROSE 5 % IV SOLN
2.0000 g | INTRAVENOUS | Status: DC
  Filled 2015-05-26: qty 2

## 2015-05-26 MED ORDER — WARFARIN - PHARMACIST DOSING INPATIENT: Freq: Every day | Status: DC

## 2015-05-26 MED ORDER — MAGIC MOUTHWASH
15.0000 mL | Freq: Four times a day (QID) | ORAL | Status: DC | PRN
  Filled 2015-05-26: qty 15

## 2015-05-26 MED ORDER — ENOXAPARIN SODIUM 40 MG/0.4ML ~~LOC~~ SOLN: 40.0000 mg | Freq: Once | SUBCUTANEOUS | Status: DC

## 2015-05-26 MED ORDER — METRONIDAZOLE 500 MG PO TABS: 500.0000 mg | ORAL_TABLET | ORAL | Status: DC

## 2015-05-26 MED ORDER — BLISTEX MEDICATED EX OINT
TOPICAL_OINTMENT | Freq: Two times a day (BID) | CUTANEOUS | Status: DC
  Administered 2015-05-26 – 2015-05-27 (×2): via TOPICAL
  Administered 2015-05-27: 1 via TOPICAL
  Administered 2015-05-27 – 2015-05-28 (×2): via TOPICAL
  Administered 2015-05-28: 1 via TOPICAL
  Administered 2015-05-29: 10:00:00 via TOPICAL
  Administered 2015-05-29: 1 via TOPICAL
  Administered 2015-05-30 – 2015-05-31 (×3): via TOPICAL
  Administered 2015-06-01: 1 via TOPICAL
  Administered 2015-06-01 – 2015-06-06 (×8): via TOPICAL
  Administered 2015-06-06: 1 via TOPICAL
  Administered 2015-06-07 – 2015-06-14 (×14): via TOPICAL
  Filled 2015-05-26 (×2): qty 10

## 2015-05-26 MED ORDER — CHLORHEXIDINE GLUCONATE 4 % EX LIQD
60.0000 mL | Freq: Once | CUTANEOUS | Status: AC
  Administered 2015-05-26: 4 via TOPICAL
  Filled 2015-05-26: qty 60

## 2015-05-26 MED ORDER — VITAMIN K1 10 MG/ML IJ SOLN
10.0000 mg | Freq: Every day | INTRAMUSCULAR | Status: AC
  Administered 2015-05-26 – 2015-05-27 (×2): 10 mg via SUBCUTANEOUS
  Filled 2015-05-26 (×2): qty 1

## 2015-05-26 MED ORDER — NEOMYCIN SULFATE 500 MG PO TABS: 500.0000 mg | ORAL_TABLET | ORAL | Status: DC

## 2015-05-26 MED ORDER — NYSTATIN 100000 UNIT/ML MT SUSP
5.0000 mL | Freq: Four times a day (QID) | OROMUCOSAL | Status: DC
  Administered 2015-05-26 – 2015-05-28 (×8): 500000 [IU] via ORAL
  Filled 2015-05-26 (×8): qty 5

## 2015-05-26 MED ORDER — METOPROLOL TARTRATE 5 MG/5ML IV SOLN: 5.0000 mg | Freq: Four times a day (QID) | INTRAVENOUS | Status: DC | PRN

## 2015-05-26 MED ORDER — NEOMYCIN SULFATE 500 MG PO TABS
1000.0000 mg | ORAL_TABLET | ORAL | Status: AC
  Administered 2015-05-26 (×3): 1000 mg via ORAL
  Filled 2015-05-26 (×3): qty 2

## 2015-05-26 MED ORDER — ALUM & MAG HYDROXIDE-SIMETH 200-200-20 MG/5ML PO SUSP: 30.0000 mL | Freq: Four times a day (QID) | ORAL | Status: DC | PRN

## 2015-05-26 MED ORDER — PHENOL 1.4 % MT LIQD
2.0000 | OROMUCOSAL | Status: DC | PRN
Start: 2015-05-26 — End: 2015-05-26

## 2015-05-26 MED ORDER — METRONIDAZOLE 500 MG PO TABS
1000.0000 mg | ORAL_TABLET | ORAL | Status: AC
  Administered 2015-05-26 (×3): 1000 mg via ORAL
  Filled 2015-05-26 (×3): qty 2

## 2015-05-26 MED ORDER — KCL IN DEXTROSE-NACL 30-5-0.45 MEQ/L-%-% IV SOLN
INTRAVENOUS | Status: DC
Start: 2015-05-26 — End: 2015-05-27
  Administered 2015-05-26 – 2015-05-27 (×2): via INTRAVENOUS
  Filled 2015-05-26 (×6): qty 1000

## 2015-05-26 MED ORDER — MENTHOL 3 MG MT LOZG: 1.0000 | LOZENGE | OROMUCOSAL | Status: DC | PRN

## 2015-05-26 MED ORDER — LACTATED RINGERS IV BOLUS (SEPSIS): 1000.0000 mL | Freq: Once | INTRAVENOUS | Status: DC

## 2015-05-26 MED ORDER — POLYETHYLENE GLYCOL 3350 17 G PO PACK
17.0000 g | PACK | Freq: Two times a day (BID) | ORAL | Status: DC
  Administered 2015-05-26: 17 g via ORAL
  Filled 2015-05-26: qty 1

## 2015-05-26 MED ORDER — BISACODYL 10 MG RE SUPP
10.0000 mg | Freq: Every day | RECTAL | Status: DC
  Administered 2015-05-26: 10 mg via RECTAL
  Filled 2015-05-26: qty 1

## 2015-05-26 MED ORDER — MORPHINE SULFATE (PF) 2 MG/ML IV SOLN
1.0000 mg | INTRAVENOUS | Status: DC | PRN
  Administered 2015-05-26 – 2015-06-01 (×17): 2 mg via INTRAVENOUS
  Filled 2015-05-26 (×17): qty 1

## 2015-05-26 MED ORDER — CHLORHEXIDINE GLUCONATE 4 % EX LIQD
60.0000 mL | Freq: Once | CUTANEOUS | Status: AC
  Administered 2015-05-27: 4 via TOPICAL
  Filled 2015-05-26: qty 60

## 2015-05-26 MED ORDER — ACETAMINOPHEN 650 MG RE SUPP: 650.0000 mg | Freq: Four times a day (QID) | RECTAL | Status: DC | PRN

## 2015-05-26 MED ORDER — FLUCONAZOLE IN SODIUM CHLORIDE 200-0.9 MG/100ML-% IV SOLN
200.0000 mg | INTRAVENOUS | Status: DC
  Administered 2015-05-26 – 2015-06-03 (×9): 200 mg via INTRAVENOUS
  Filled 2015-05-26 (×9): qty 100

## 2015-05-26 NOTE — Progress Notes (Signed)
CRITICAL VALUE ALERT  Critical value received:  Positive c-diff  Date of notification:  05/26/15  Time of notification:  17:26  Critical value read back: yes  Nurse who received alert:  Cassell Smiles   MD notified (1st page):  yes  Time of first page:  17:30  MD notified (2nd page):  Time of second page:  Responding MD:  Dr. Avon Gully  Time MD responded:  17:34

## 2015-05-26 NOTE — Progress Notes (Signed)
Lactated Ringers not given.  Do not give per Dr. Avon Gully.

## 2015-05-26 NOTE — Progress Notes (Signed)
Family Medicine Teaching Service Daily Progress Note Intern Pager: [phone]  Patient name: [name] Medical record number: [id number] Date of birth: [birth year] Age: 82 y.o. Gender: female  Primary Care Provider: [name], MD Consultants: Surgery Code Status: Full  Pt Overview and Major Events to Date:  4/28: Readmit for Diverticulitis with possible perforation / abscess  Assessment and Plan: [name] is a 82 y.o. female presenting with LLQ abdominal pain. PMH is significant for atrial fibrillation, hx of CVA, hypertension, type 2 diabetes, tachycardia-bradycardia syndrome s/p pacemaker, prior pericardial effusion s/p pericardial window 12/16, sleep apnea (no CPAP), stroke (right frontal 2013), GI bleeding with hemorrhoids, and arthritis and recurrent diverticulitis.  Recurrent diverticulitis with possible perforation / abscess:CT showed: diverticulitis involving the sigmoid colon with prreviously noted extra luminal collection of gas is again noted and is increased in size from previous exam, worrisome for abscess. Additionally, there are several additional suspicious collections of fluid and gas which may represent abscesses. WBC 27.5 (4/29) - Appreciate surg recs:   Likely will need PICC and TPN  Will need repeat CT with contrast 4/30, awaiting surgery recs  Possible Colectomy 5/1 - Abx: Zosyn per pharm 4/29>> - Pain control: Morphine 1-2 mg q3hrs prn - zosyn prn for nausea - Check C Diff  Atrial fibrillation: INR supra-therapeutic on 4/26 - Continue Verapamil and Digoxin  - warfarin per pharmacy - Check INR  DM2: Diet contorlled. A1c 6.2 (12/2014) - monitor cbgs; SSI sensitive  - check A1c  Oral Thrush - Nystatin   Anemia: Hgb 10.4 on admit (baseline 11). History of diverticulitis and hemorrhoids - monitor CBC  AoCKD stage 3: Baseline Cr 1-1.2. Cr 1.44 (admit; 4/29) - Cr 1.68 (4/30) - Monitor BMP  Metabolic  alkalosis/Hypokalemia/hypochloremia: Likely secondary to diuretics. - Monitor and replace K - IVF as below  FEN/GI: Clear Liquid diet; D5 1/2 NS +30K @ 125 Prophylaxis: Warfarin per pharm  Disposition: medsurg  Subjective:  Continues to endorse LLQ pain somewhat improved. Endorses 4-5 bowel movement since admit. Denies CP, SOB, fevers.   Objective: Temp:  [98 F (36.7 C)-98.9 F (37.2 C)] 98.7 F (37.1 C) (04/30 0600) Pulse Rate:  [36-90] 70 (04/30 0600) Resp:  [16-32] 17 (04/30 0600) BP: (98-117)/(47-72) 108/56 mmHg (04/30 0600) SpO2:  [92 %-100 %] 99 % (04/30 0600) Weight:  [124 lb 9.6 oz (56.518 kg)] 124 lb 9.6 oz (56.518 kg) (04/29 2102)  Physical Exam: General: NAD Eyes: EOMI; PERRLA ENTM: MM dry Neck: supple Cardiovascular: irregularly irregular rhythm with 2/6 murmur; Pacemaker  Respiratory: CTAB; Normal effort Abdomen: Soft with voluntary guarding; tenderness greatest in LLQ with palpable mass MSK: normal tone Skin: no rash Neuro: A&Ox3; CN3-12 intact; Gross motor and sensory intact Psych: normal mood and affect   Laboratory:  Recent Labs Lab 05/25/15 1639 05/26/15 0551  WBC 27.5* 28.3*  HGB 10.4* 11.0*  HCT 33.8* 34.6*  PLT 470* 494*    Recent Labs Lab 05/25/15 1639 05/26/15 0551  NA 133* 132*  K 3.0* 3.2*  CL 84* 85*  CO2 35* 33*  BUN 37* 42*  CREATININE 1.44* 1.68*  CALCIUM 8.8* 8.6*  PROT 6.4*  --   BILITOT 0.8  --   ALKPHOS 109  --   ALT 21  --   AST 27  --   GLUCOSE 111* 175*    Recent Labs Lab 05/22/15 05/25/15 2205 05/26/15 0551  INR 7.1 3.01* 3.32*   Imaging/Diagnostic Tests: Ct Abdomen Pelvis Wo Contrast  05/25/2015  IMPRESSION: 1. There are persistent inflammatory changes of diverticulitis involving the sigmoid colon. 2. Previously noted extra luminal collection of gas is again noted and is increased in size from previous exam, worrisome for abscess. Additionally, there are several additional suspicious collections of  fluid and gas which may represent abscesses, but without enteric contrast material high cannot distinguish this from on opacified bowel loops. A repeat examination with IV, oral and rectal contrast may be helpful to assess for extent of left lower quadrant diverticular abscess formation. 3. Cardiac enlargement and pericardial effusion. 4. Aortic atherosclerosis. Electronically Signed   By: Kerby Moors M.D.   On: 05/25/2015 18:15   Olam Idler, MD 05/26/2015, 9:39 AM PGY-3, [place] Intern pager: [phone], text pages welcome

## 2015-05-26 NOTE — Progress Notes (Signed)
[facility]  [address] [place], [place] [address] Phone: [phone] FAX: [phone]   [name] [id number] Aug 08, 1923  Problem List:   Principal Problem:   Diverticulitis of intestine with perforation and abscess Active Problems:   Chronic anticoagulation - Coumadin, CHADS2VASC=7   HTN (hypertension)   Pacemaker   Diabetes ([place])   History of CVA (cerebrovascular accident)   Tachycardia-bradycardia syndrome with symptomatic bradycardia   Chronic atrial fibrillation (HCC)   Diverticulitis   Supratherapeutic INR      * No surgery found *     Assessment  Recurrent/persistent perforated sigmoid diverticlutis with FTT & elarging mesenteric gas collection c/w worse abscess  Plan:  -This will not get better w/o colectomy.  Location of perf/collection not amenable to drainage as noted in prior CT 3 weeks ago.  Not expecting safe to do immediate anastomosis in setting of perforation, malnutrition, adv age, etc... - will see at time of OR.  She was resistant to surgery & esp colostomy but now relents given the persistence & failure of non-OR management.  She is frustrated with this.  Will need her INR<1.5 before can do surgery.  Will set up for OR tomorrow by my partner, Dr Georgette Dover, but suspect she may need another 1-2 days.  Patient not toxic so will try slower correction with vitK.  If worsens, more rapid reversal.  The anatomy & physiology of the digestive tract was discussed.  The pathophysiology of the colon was discussed.  Natural history risks without surgery was discussed.   I feel the risks of no intervention will lead to serious problems that outweigh the operative risks; therefore, I recommended a partial colectomy to remove the pathology.  Minimally invasive (Robotic/Laparoscopic) & open techniques were discussed.   Risks such as bleeding, infection, abscess, leak, reoperation, injury to other organs, need for repair of  tissues / organs, possible ostomy, hernia, heart attack, stroke, death, and other risks were discussed.  I noted a good likelihood this will help address the problem.   Goals of post-operative recovery were discussed as well.   Need for adequate nutrition, daily bowel regimen and healthy physical activity, to optimize recovery was noted as well. We will work to minimize complications.  Educational materials were available as well.  Questions were answered.  The patient expresses understanding of her increased risks & wishes to proceed with surgery.  -colostomy preop marking.  Colectomy protocol -try liquids a little.  No major eating with perf diverticulitis.  Try a few doses of Miralax to clean her out but go easy. -VTE prophylaxis- SCDs, etc.  INR >3 -back off on BP meds -mobilize as tolerated to help recovery  Adin Hector, M.D., F.A.C.S. Gastrointestinal and Minimally Invasive Surgery [facility] [address], [place], [place] [address] [phone] Main / Paging   05/26/2015  Subjective:  Sore - IV narcotics help Feels like BM more narrow & "looks different.  Am I draining the abscess?"  Objective:  Vital signs:  Filed Vitals:   05/25/15 2030 05/25/15 2102 05/25/15 2229 05/26/15 0600  BP: 107/72  109/49 108/56  Pulse: 68  65 70  Temp:   98 F (36.7 C) 98.7 F (37.1 C)  TempSrc:   Oral Oral  Resp: '17  18 17  '$ Height:  '5\' 5"'$  (1.651 m)    Weight:  56.518 kg (124 lb 9.6 oz)    SpO2: 98%  100% 99%  Intake/Output   Yesterday:  04/29 0701 - 04/30 0700 In: 500 [I.V.:500] Out: -  This shift:     Bowel function:  Flatus: y  BM: scant  Drain: n/a  Physical Exam:  General: Pt awake/alert/oriented x4 in mild acute distress Eyes: PERRL, normal EOM.  Sclera clear.  No icterus Neuro: CN II-XII intact w/o focal sensory/motor deficits. Lymph: No head/neck/groin lymphadenopathy Psych:  No delerium/psychosis/paranoia HENT:  Normocephalic, Mucus membranes moist.  No thrush.  Moderately hard of hearing  Neck: Supple, No tracheal deviation Chest: No chest wall pain w good excursion CV:  Pulses intact.  Regular rhythm MS: Normal AROM mjr joints.  No obvious deformity Abdomen: Soft.  Mildly distended.  Mildly tender at LLQ.  Mild evidence of peritonitis.  No incarcerated hernias. Ext:  SCDs BLE.  No mjr edema.  No cyanosis Skin: No petechiae / purpura  Results:   Labs: Results for orders placed or performed during the hospital encounter of 05/25/15 (from the past 48 hour(s))  CBC with Differential     Status: Abnormal   Collection Time: 05/25/15  4:39 PM  Result Value Ref Range   WBC 27.5 (H) 4.0 - 10.5 K/uL    Comment: REPEATED TO VERIFY WHITE COUNT CONFIRMED ON SMEAR    RBC 4.06 3.87 - 5.11 MIL/uL   Hemoglobin 10.4 (L) 12.0 - 15.0 g/dL   HCT 33.8 (L) 36.0 - 46.0 %   MCV 83.3 78.0 - 100.0 fL   MCH 25.6 (L) 26.0 - 34.0 pg   MCHC 30.8 30.0 - 36.0 g/dL   RDW 15.2 11.5 - 15.5 %   Platelets 470 (H) 150 - 400 K/uL   Neutrophils Relative % 92 %   Lymphocytes Relative 4 %   Monocytes Relative 4 %   Eosinophils Relative 0 %   Basophils Relative 0 %   Neutro Abs 25.3 (H) 1.7 - 7.7 K/uL   Lymphs Abs 1.1 0.7 - 4.0 K/uL   Monocytes Absolute 1.1 (H) 0.1 - 1.0 K/uL   Eosinophils Absolute 0.0 0.0 - 0.7 K/uL   Basophils Absolute 0.0 0.0 - 0.1 K/uL   Smear Review LARGE PLATELETS PRESENT   Comprehensive metabolic panel     Status: Abnormal   Collection Time: 05/25/15  4:39 PM  Result Value Ref Range   Sodium 133 (L) 135 - 145 mmol/L   Potassium 3.0 (L) 3.5 - 5.1 mmol/L   Chloride 84 (L) 101 - 111 mmol/L   CO2 35 (H) 22 - 32 mmol/L   Glucose, Bld 111 (H) 65 - 99 mg/dL   BUN 37 (H) 6 - 20 mg/dL   Creatinine, Ser 1.44 (H) 0.44 - 1.00 mg/dL   Calcium 8.8 (L) 8.9 - 10.3 mg/dL   Total Protein 6.4 (L) 6.5 - 8.1 g/dL   Albumin 2.4 (L) 3.5 - 5.0 g/dL   AST 27 15 - 41 U/L   ALT 21 14 - 54 U/L   Alkaline Phosphatase  109 38 - 126 U/L   Total Bilirubin 0.8 0.3 - 1.2 mg/dL   GFR calc non Af Amer 31 (L) >60 mL/min   GFR calc Af Amer 36 (L) >60 mL/min    Comment: (NOTE) The eGFR has been calculated using the CKD EPI equation. This calculation has not been validated in all clinical situations. eGFR's persistently <60 mL/min signify possible Chronic Kidney Disease.    Anion gap 14 5 - 15  Lipase, blood     Status: None   Collection Time: 05/25/15  4:39 PM  Result Value Ref Range   Lipase 25 11 - 51 U/L  Lactic acid, plasma     Status: None   Collection Time: 05/25/15  4:39 PM  Result Value Ref Range   Lactic Acid, Venous 1.5 0.5 - 2.0 mmol/L  Urinalysis, Routine w reflex microscopic (not at [facility])     Status: Abnormal   Collection Time: 05/25/15  6:07 PM  Result Value Ref Range   Color, Urine YELLOW YELLOW    Comment: LESS THAN 10 mL OF URINE SUBMITTED MICROSCOPIC EXAM PERFORMED ON UNCONCENTRATED URINE    APPearance HAZY (A) CLEAR   Specific Gravity, Urine 1.010 1.005 - 1.030   pH 5.5 5.0 - 8.0   Glucose, UA NEGATIVE NEGATIVE mg/dL   Hgb urine dipstick SMALL (A) NEGATIVE   Bilirubin Urine NEGATIVE NEGATIVE   Ketones, ur NEGATIVE NEGATIVE mg/dL   Protein, ur NEGATIVE NEGATIVE mg/dL   Nitrite NEGATIVE NEGATIVE   Leukocytes, UA LARGE (A) NEGATIVE  Urine microscopic-add on     Status: Abnormal   Collection Time: 05/25/15  6:07 PM  Result Value Ref Range   Squamous Epithelial / LPF 0-5 (A) NONE SEEN   WBC, UA 0-5 0 - 5 WBC/hpf   RBC / HPF 0-5 0 - 5 RBC/hpf   Bacteria, UA FEW (A) NONE SEEN  Protime-INR     Status: Abnormal   Collection Time: 05/25/15 10:05 PM  Result Value Ref Range   Prothrombin Time 30.7 (H) 11.6 - 15.2 seconds   INR 3.01 (H) 0.00 - 7.40  Basic metabolic panel     Status: Abnormal   Collection Time: 05/26/15  5:51 AM  Result Value Ref Range   Sodium 132 (L) 135 - 145 mmol/L   Potassium 3.2 (L) 3.5 - 5.1 mmol/L   Chloride 85 (L) 101 - 111 mmol/L   CO2 33 (H) 22 - 32  mmol/L   Glucose, Bld 175 (H) 65 - 99 mg/dL   BUN 42 (H) 6 - 20 mg/dL   Creatinine, Ser 1.68 (H) 0.44 - 1.00 mg/dL   Calcium 8.6 (L) 8.9 - 10.3 mg/dL   GFR calc non Af Amer 26 (L) >60 mL/min   GFR calc Af Amer 30 (L) >60 mL/min    Comment: (NOTE) The eGFR has been calculated using the CKD EPI equation. This calculation has not been validated in all clinical situations. eGFR's persistently <60 mL/min signify possible Chronic Kidney Disease.    Anion gap 14 5 - 15  CBC     Status: Abnormal   Collection Time: 05/26/15  5:51 AM  Result Value Ref Range   WBC 28.3 (H) 4.0 - 10.5 K/uL   RBC 4.20 3.87 - 5.11 MIL/uL   Hemoglobin 11.0 (L) 12.0 - 15.0 g/dL   HCT 34.6 (L) 36.0 - 46.0 %   MCV 82.4 78.0 - 100.0 fL   MCH 26.2 26.0 - 34.0 pg   MCHC 31.8 30.0 - 36.0 g/dL   RDW 15.3 11.5 - 15.5 %   Platelets 494 (H) 150 - 400 K/uL  Protime-INR     Status: Abnormal   Collection Time: 05/26/15  5:51 AM  Result Value Ref Range   Prothrombin Time 33.0 (H) 11.6 - 15.2 seconds   INR 3.32 (H) 0.00 - 1.49    Imaging / Studies: Ct Abdomen Pelvis Wo Contrast  05/25/2015  CLINICAL DATA:  Left lower quadrant pain EXAM: CT ABDOMEN AND PELVIS WITHOUT CONTRAST TECHNIQUE: Multidetector CT imaging of the abdomen  and pelvis was performed following the standard protocol without IV contrast. COMPARISON:  05/02/2015 FINDINGS: Lower chest: Multi chamber cardiac enlargement is again noted. There is a moderate pericardial effusion present, similar to previous exam. No pleural effusion. Hepatobiliary: No focal liver abnormality identified on this unenhanced exam. Previous cholecystectomy. No biliary dilatation. Pancreas: No inflammation or mass identified. Spleen: The spleen appears normal. Adrenals/Urinary Tract: The adrenal glands appear normal. Bilateral renal cortical scarring and volume loss. There are bilateral renal cysts of varying density which are incompletely characterized without IV contrast material. No  hydronephrosis or hydroureter. The urinary bladder appears normal. Stomach/Bowel: The stomach is normal. The small bowel loops have a normal caliber without obstruction. The proximal colon is unremarkable. There is wall thickening and inflammation involving the sigmoid colon compatible with acute sigmoid diverticulitis. Extraluminal collection of gas and phlegmon has increased in size from previous exam now measuring 3.9 x 2.5, image 60 of series 2. Previous 2.1 x 0.7 cm. There are several additional, suspicious collections of gas and fluid within the left lower quadrant of the abdomen, for example image 68 of series 2 and image 62 of series 2. Without enteric contrast material I cannot distinguish between unopacified bowel loops and potential abscess. No pathologic dilatation of the bowel loops. Vascular/Lymphatic: Calcified atherosclerotic disease involves the abdominal aorta. No aneurysm. No enlarged retroperitoneal or mesenteric adenopathy. No enlarged pelvic or inguinal lymph nodes. Reproductive: The uterus and the adnexal structures are unremarkable. Other: There is a small amount of fluid overlying the anterior liver. Free fluid also noted within the left lower quadrant of the abdomen and tracking along the left pericolic gutter. Musculoskeletal: Degenerative disc disease noted within the lumbar spine. IMPRESSION: 1. There are persistent inflammatory changes of diverticulitis involving the sigmoid colon. 2. Previously noted extra luminal collection of gas is again noted and is increased in size from previous exam, worrisome for abscess. Additionally, there are several additional suspicious collections of fluid and gas which may represent abscesses, but without enteric contrast material high cannot distinguish this from on opacified bowel loops. A repeat examination with IV, oral and rectal contrast may be helpful to assess for extent of left lower quadrant diverticular abscess formation. 3. Cardiac  enlargement and pericardial effusion. 4. Aortic atherosclerosis. Electronically Signed   By: Kerby Moors M.D.   On: 05/25/2015 18:15    Medications / Allergies: per chart  Antibiotics: Anti-infectives    Start     Dose/Rate Route Frequency Ordered Stop   05/26/15 0600  piperacillin-tazobactam (ZOSYN) IVPB 3.375 g     3.375 g 12.5 mL/hr over 240 Minutes Intravenous Every 8 hours 05/25/15 2155     05/25/15 1900  piperacillin-tazobactam (ZOSYN) IVPB 3.375 g     3.375 g 100 mL/hr over 30 Minutes Intravenous  Once 05/25/15 1851 05/25/15 2002        Note: Portions of this report may have been transcribed using voice recognition software. Every effort was made to ensure accuracy; however, inadvertent computerized transcription errors may be present.   Any transcriptional errors that result from this process are unintentional.     Adin Hector, M.D., F.A.C.S. Gastrointestinal and Minimally Invasive Surgery [facility] [address], [place], [place] [address] [phone] Main / Paging   05/26/2015  CARE TEAM:  PCP: [name], MD  Outpatient Care Team: Patient Care Team: Darlyne Russian, MD as PCP - General (Family Medicine) Sanda Klein, MD as Consulting Physician (Cardiology) Lilia Argue  Servando Snare, MD as Consulting Physician (Cardiothoracic Surgery)  Inpatient Treatment Team: Treatment Team: Attending Provider: Alveda Reasons, MD; Consulting Physician: Nolon Nations, MD; Registered Nurse: Lezlie Octave, RN; Technician: Jossie Ng, NT

## 2015-05-26 NOTE — Progress Notes (Signed)
ANTIBIOTIC RENAL DOSING/ANTICOAGULATION CONSULT NOTE  Pharmacy Consult for Pre-op Cefotetan/Warfarin  Indication: Surgical prophylaxis/Afib   Labs:  Recent Labs  05/25/15 1639 05/25/15 2205 05/26/15 0551  HGB 10.4*  --  11.0*  HCT 33.8*  --  34.6*  PLT 470*  --  494*  LABPROT  --  30.7* 33.0*  INR  --  3.01* 3.32*  CREATININE 1.44*  --  1.68*    Estimated Creatinine Clearance: 19.9 mL/min (by C-G formula based on Cr of 1.68).   Assessment: 82 y.o female to OR 5/1 for bowel surgery. Current antibiotic doses appropriate. Of note, patient on PTA warfarin for Afib. INR supratherapeutic today at 3.32. Vit K 10mg  SQ x2 doses ordered. Warfarin held.     Plan:  Continue antibiotics as ordered F/U daily INR  Stephens November, [name] Clinical Pharmacy Resident 8:47 AM, 05/26/2015

## 2015-05-26 NOTE — Progress Notes (Signed)
[facility] for Coumadin  Indication: chronic atrial fibrillation   Labs:  Recent Labs  05/25/15 1639 05/25/15 2205  HGB 10.4*  --   HCT 33.8*  --   PLT 470*  --   LABPROT  --  30.7*  INR  --  3.01*  CREATININE 1.44*  --     Estimated Creatinine Clearance: 23.2 mL/min (by C-G formula based on Cr of 1.44).   Assessment: 82 y.o female abdominal pain, h/o Afib, to continue Coumadin.   Goal of Therapy:  INR 2-3 Monitor platelets by anticoagulation protocol: Yes   Plan:  No Coumadin tonight F/U daily INR  Phillis Knack, [name], BCPS   05/26/2015,12:17 AM

## 2015-05-27 ENCOUNTER — Ambulatory Visit (INDEPENDENT_AMBULATORY_CARE_PROVIDER_SITE_OTHER): Payer: Medicare Other | Admitting: Pharmacist

## 2015-05-27 ENCOUNTER — Encounter (HOSPITAL_COMMUNITY)
Admission: EM | Disposition: A | Discharge status: Rehabilitation Facility | Payer: Self-pay | Source: Home / Self Care | Attending: Family Medicine

## 2015-05-27 ENCOUNTER — Ambulatory Visit: Payer: Medicare Other | Admitting: Physician Assistant

## 2015-05-27 ENCOUNTER — Inpatient Hospital Stay: Admission: RE | Admit: 2015-05-27 | Payer: Medicare Other | Source: Ambulatory Visit

## 2015-05-27 DIAGNOSIS — I6310 Cerebral infarction due to embolism of unspecified precerebral artery: Secondary | ICD-10-CM

## 2015-05-27 DIAGNOSIS — I10 Essential (primary) hypertension: Secondary | ICD-10-CM

## 2015-05-27 DIAGNOSIS — E0865 Diabetes mellitus due to underlying condition with hyperglycemia: Secondary | ICD-10-CM

## 2015-05-27 DIAGNOSIS — R791 Abnormal coagulation profile: Secondary | ICD-10-CM

## 2015-05-27 DIAGNOSIS — Z7901 Long term (current) use of anticoagulants: Secondary | ICD-10-CM

## 2015-05-27 DIAGNOSIS — Z8673 Personal history of transient ischemic attack (TIA), and cerebral infarction without residual deficits: Secondary | ICD-10-CM

## 2015-05-27 DIAGNOSIS — K5732 Diverticulitis of large intestine without perforation or abscess without bleeding: Secondary | ICD-10-CM

## 2015-05-27 LAB — CBC
HCT: 30.5 % — ABNORMAL LOW (ref 36.0–46.0)
Hemoglobin: 9.7 g/dL — ABNORMAL LOW (ref 12.0–15.0)
MCH: 26.1 pg (ref 26.0–34.0)
MCHC: 31.8 g/dL (ref 30.0–36.0)
MCV: 82.2 fL (ref 78.0–100.0)
Platelets: 432 K/uL — ABNORMAL HIGH (ref 150–400)
RBC: 3.71 MIL/uL — ABNORMAL LOW (ref 3.87–5.11)
RDW: 15.1 % (ref 11.5–15.5)
WBC: 24.7 K/uL — ABNORMAL HIGH (ref 4.0–10.5)

## 2015-05-27 LAB — HEMOGLOBIN A1C
HEMOGLOBIN A1C: 5.2 % (ref 4.8–5.6)
Hgb A1c MFr Bld: 6.3 % — ABNORMAL HIGH (ref 4.8–5.6)
MEAN PLASMA GLUCOSE: 134 mg/dL
MEAN PLASMA GLUCOSE: 103 mg/dL

## 2015-05-27 LAB — BASIC METABOLIC PANEL
Anion gap: 12 (ref 5–15)
BUN: 45 mg/dL — AB (ref 6–20)
CHLORIDE: 86 mmol/L — AB (ref 101–111)
CO2: 30 mmol/L (ref 22–32)
Calcium: 8.5 mg/dL — ABNORMAL LOW (ref 8.9–10.3)
Creatinine, Ser: 2.04 mg/dL — ABNORMAL HIGH (ref 0.44–1.00)
GFR calc Af Amer: 24 mL/min — ABNORMAL LOW (ref >60)
GFR calc non Af Amer: 20 mL/min — ABNORMAL LOW (ref >60)
GLUCOSE: 147 mg/dL — AB (ref 65–99)
POTASSIUM: 4.8 mmol/L (ref 3.5–5.1)
Sodium: 128 mmol/L — ABNORMAL LOW (ref 135–145)

## 2015-05-27 LAB — PROTIME-INR
INR: 1.87 — ABNORMAL HIGH (ref 0.00–1.49)
Prothrombin Time: 21.5 s — ABNORMAL HIGH (ref 11.6–15.2)

## 2015-05-27 LAB — PREALBUMIN: Prealbumin: 4.5 mg/dL — ABNORMAL LOW (ref 18–38)

## 2015-05-27 LAB — GLUCOSE, CAPILLARY
GLUCOSE-CAPILLARY: 138 mg/dL — AB (ref 65–99)
Glucose-Capillary: 148 mg/dL — ABNORMAL HIGH (ref 65–99)
Glucose-Capillary: 115 mg/dL — ABNORMAL HIGH (ref 65–99)

## 2015-05-27 LAB — MRSA PCR SCREENING: MRSA by PCR: NEGATIVE

## 2015-05-27 SURGERY — LAPAROTOMY, EXPLORATORY
Anesthesia: General

## 2015-05-27 MED ORDER — SODIUM CHLORIDE 0.9 % IV SOLN
INTRAVENOUS | Status: AC
  Administered 2015-05-27 – 2015-05-28 (×2): via INTRAVENOUS

## 2015-05-27 MED ORDER — METRONIDAZOLE IN NACL 5-0.79 MG/ML-% IV SOLN
500.0000 mg | Freq: Three times a day (TID) | INTRAVENOUS | Status: DC
  Administered 2015-05-27 – 2015-05-28 (×4): 500 mg via INTRAVENOUS
  Filled 2015-05-27 (×6): qty 100

## 2015-05-27 MED ORDER — PIPERACILLIN-TAZOBACTAM IN DEX 2-0.25 GM/50ML IV SOLN
2.2500 g | Freq: Four times a day (QID) | INTRAVENOUS | Status: DC
  Administered 2015-05-27 – 2015-05-29 (×7): 2.25 g via INTRAVENOUS
  Filled 2015-05-27 (×10): qty 50

## 2015-05-27 MED ORDER — VANCOMYCIN 50 MG/ML ORAL SOLUTION
125.0000 mg | Freq: Four times a day (QID) | ORAL | Status: DC
  Administered 2015-05-27 – 2015-05-31 (×15): 125 mg via ORAL
  Filled 2015-05-27 (×19): qty 2.5

## 2015-05-27 NOTE — Progress Notes (Signed)
Family Medicine Teaching Service Daily Progress Note Intern Pager: [phone]  Patient name: [name] Medical record number: [id number] Date of birth: [birth year] Age: 82 y.o. Gender: female  Primary Care Provider: [name], MD Consultants: Surgery Code Status: Full  Pt Overview and Major Events to Date:  4/28: Readmit for Diverticulitis with possible perforation / abscess  Assessment and Plan: [name] is a 82 y.o. female presenting with LLQ abdominal pain. PMH is significant for atrial fibrillation, hx of CVA, hypertension, type 2 diabetes, tachycardia-bradycardia syndrome s/p pacemaker, prior pericardial effusion s/p pericardial window 12/16, sleep apnea (no CPAP), stroke (right frontal 2013), GI bleeding with hemorrhoids, and arthritis and recurrent diverticulitis.  Recurrent diverticulitis with possible perforation / abscess: Continues to have a bowel movement every 2 hours. CT showed: diverticulitis involving the sigmoid colon with previously noted extra luminal collection of gas is again noted and is increased in size from previous exam, worrisome for abscess. Additionally, there are several additional suspicious collections of fluid and gas which may represent abscesses. WBC 27.5 > 24.7. Patient with positive C. Diff  - Surgery awaiting C. Diff treatment before colectomy - Continue Abx: Zosyn - Pain control: Morphine 1-2 mg q3hrs prn - Will call surgery regarding starting TPN   C.difficule positive : Continues to have many bowel movements daily  - PO vancomycin QID and IV metronidazole 500 mg IV TID  - Contact precautions   Hyponatremia:  Currently on 1/2 NS  - Will d/c current fluids and change to NS @ 133ml   Atrial fibrillation: INR supra-therapeutic on 4/26. INR 1.87 - Continue Verapamil and Digoxin  - warfarin per pharmacy - INR needs to be less than 1.5 post surgery   DM2: Diet contorlled. A1c 6.2 (12/2014) - monitor cbgs; SSI sensitive  - check  A1c  Oral Thrush - Nystatin   Anemia: Hgb 10.4 on admit (baseline 11). History of diverticulitis and hemorrhoids - monitor CBC  AoCKD stage 3: Baseline Cr 1-1.2. Cr 1.44 > Cr 2.04  (trending up) - Monitor BMP   FEN/GI: Clear Liquid diet; NS@ 125 ml  Prophylaxis: SCDs  Disposition: medsurg  Subjective:  Continues to have several bowel movements daily. Continues to have significant left lower quadrant pain.   Objective: Temp:  [98.2 F (36.8 C)-98.6 F (37 C)] 98.3 F (36.8 C) (05/01 0440) Pulse Rate:  [59-69] 60 (05/01 0440) Resp:  [18-20] 18 (05/01 0440) BP: (94-106)/(42-50) 94/48 mmHg (05/01 0440) SpO2:  [95 %-99 %] 96 % (05/01 0440)  Physical Exam: General: NAD Cardiovascular: regular rate and rhythm with 2/6 murmur; Pacemaker  Respiratory: CTAB; Normal effort Abdomen: Soft, Tenderness to palpation in LLQ, no distention  MSK: normal tone Skin: no rash   Laboratory:  Recent Labs Lab 05/25/15 1639 05/26/15 0551 05/27/15 0706  WBC 27.5* 28.3* 24.7*  HGB 10.4* 11.0* 9.7*  HCT 33.8* 34.6* 30.5*  PLT 470* 494* 432*    Recent Labs Lab 05/25/15 1639 05/26/15 0551 05/27/15 0530  NA 133* 132* 128*  K 3.0* 3.2* 4.8  CL 84* 85* 86*  CO2 35* 33* 30  BUN 37* 42* 45*  CREATININE 1.44* 1.68* 2.04*  CALCIUM 8.8* 8.6* 8.5*  PROT 6.4*  --   --   BILITOT 0.8  --   --   ALKPHOS 109  --   --   ALT 21  --   --   AST 27  --   --   GLUCOSE 111* 175* 147*    Recent  Labs Lab 05/22/15 05/25/15 2205 05/26/15 0551 05/27/15 0530  INR 7.1 3.01* 3.32* 1.87*   Imaging/Diagnostic Tests: Ct Abdomen Pelvis Wo Contrast  05/25/2015  IMPRESSION: 1. There are persistent inflammatory changes of diverticulitis involving the sigmoid colon. 2. Previously noted extra luminal collection of gas is again noted and is increased in size from previous exam, worrisome for abscess. Additionally, there are several additional suspicious collections of fluid and gas which may represent  abscesses, but without enteric contrast material high cannot distinguish this from on opacified bowel loops. A repeat examination with IV, oral and rectal contrast may be helpful to assess for extent of left lower quadrant diverticular abscess formation. 3. Cardiac enlargement and pericardial effusion. 4. Aortic atherosclerosis. Electronically Signed   By: Kerby Moors M.D.   On: 05/25/2015 18:15   Marsha Hillman Cletis Media, MD 05/27/2015, 8:18 AM PGY-3, [place] Intern pager: [phone], text pages welcome

## 2015-05-27 NOTE — Progress Notes (Signed)
Arrived to obtain consent for PICC line insertion. Patient requested that it be placed in AM. Primary RN notified.

## 2015-05-27 NOTE — Consult Note (Addendum)
WOC requested for preoperative stoma site marking for impending surgery within the next few days.  Discussed surgical procedure and stoma creation with patient and family.  Explained role of the [facility] nurse team.  Provided the patient with educational booklet/DVD and provided samples of pouching options.  Answered patient and family questions.   Examined patient lying, sitting, and standing in order to place the marking in the patient's visual field, away from any creases or abdominal contour issues and within the rectus muscle.  Attempted to mark below the patient's belt line, but this was not possible since there are loose skin folds which occur in these locations when pt is standing or sitting forward which should be avoided if possible.    Marked for colostomy in the LLQ  6____ cm to the left of the umbilicus and [id number] above the umbilicus.  Marked for ileostomy in the RLQ  __6__cm to the right of the umbilicus and  [id number] cm abovethe umbilicus.  [place] team will follow up with patient after surgery for continue ostomy care and teaching if ostomy is performed. Julien Girt MSN, RN, [place], [place], [place]

## 2015-05-27 NOTE — Progress Notes (Signed)
Pharmacy Antibiotic Note  [name] is a 82 y.o. female admitted on 05/25/2015 with diverticulitis with possible perforation/abscess. She continues on day # 3 zosyn. Renal function worsening with sCr 1.44 > 1.68 > 2.04 and CrCl now 16 ml/min. Will adjust dose accordingly.  Plan: 1) Change zosyn to 2.25g IV q6 2) Continue fluconazole 200mg  IV q24  Height: 5\' 5"  (165.1 cm) (05/25/15 @ 1352) Weight: 124 lb 9.6 oz (56.518 kg) (05/25/15 @ 1352) IBW/kg (Calculated) : 57  Temp (24hrs), Avg:98.3 F (36.8 C), Min:98.2 F (36.8 C), Max:98.6 F (37 C)   Recent Labs Lab 05/25/15 1639 05/26/15 0551 05/27/15 0530 05/27/15 0706  WBC 27.5* 28.3*  --  24.7*  CREATININE 1.44* 1.68* 2.04*  --   LATICACIDVEN 1.5  --   --   --     Estimated Creatinine Clearance: 16.3 mL/min (by C-G formula based on Cr of 2.04).    Allergies  Allergen Reactions  . Anti-Inflammatory Enzyme [Nutritional Supplements] Other (See Comments)    Retains fluids and headaches  . Iodinated Diagnostic Agents Hives    HIVES 15MIN S/P IV CONTRAST INJECTION,WILL NEED 13 HR PREP FOR FUTURE INJECTIONS, ok s/p 50mg  po benadryl//a.calhoun  . Spironolactone Other (See Comments)    Hair loss  . Arthrotec [Diclofenac-Misoprostol] Other (See Comments)    unknown  . Biaxin [Clarithromycin] Other (See Comments)    unknown  . Plavix [Clopidogrel Bisulfate] Other (See Comments)    unknown    Antimicrobials this admission: 4/29 Zosyn >> 4/30 Fluconazole>> 4/30 Flagyl x 3 doses  Microbiology results: 4/30 Cdiff PCR positive, antigen negative 4/30 UA >> few bacteria, large leukocytes, no nitrities, WBC wnl  Thank you for allowing pharmacy to be a part of this patient's care.  Deboraha Sprang 05/27/2015 8:34 AM

## 2015-05-27 NOTE — Progress Notes (Signed)
Attempted to call report- RN not available at this time.

## 2015-05-27 NOTE — Progress Notes (Signed)
Day of Surgery  Subjective: Large family presence.  She has pain in LLQ, but otherwise looks fine.  Ongoing loose stools.    Objective: Vital signs in last 24 hours: Temp:  [98.2 F (36.8 C)-98.3 F (36.8 C)] 98.3 F (36.8 C) (05/01 0440) Pulse Rate:  [59-67] 60 (05/01 0440) Resp:  [18-20] 18 (05/01 0440) BP: (94-98)/(42-48) 94/48 mmHg (05/01 0440) SpO2:  [95 %-99 %] 96 % (05/01 0440) Last BM Date: 05/26/15 8 stools yesterday now Positive for C diff colitis No urine output recorded Afebrile, VSS, but BP in in the 90's.  Na 128, creatinine is 2.04, WBC is 24.7 INR 1.87.  Intake/Output from previous day: 04/30 0701 - 05/01 0700 In: 1459.6 [P.O.:720; I.V.:739.6] Out: 0  Intake/Output this shift:    General appearance: alert, cooperative, no distress and very hard of hearing. GI: soft, tender LLQ, no significnat distension, few BS  Lab Results:   Recent Labs  05/26/15 0551 05/27/15 0706  WBC 28.3* 24.7*  HGB 11.0* 9.7*  HCT 34.6* 30.5*  PLT 494* 432*    BMET  Recent Labs  05/26/15 0551 05/27/15 0530  NA 132* 128*  K 3.2* 4.8  CL 85* 86*  CO2 33* 30  GLUCOSE 175* 147*  BUN 42* 45*  CREATININE 1.68* 2.04*  CALCIUM 8.6* 8.5*   PT/INR  Recent Labs  05/26/15 0551 05/27/15 0530  LABPROT 33.0* 21.5*  INR 3.32* 1.87*     Recent Labs Lab 05/25/15 1639  AST 27  ALT 21  ALKPHOS 109  BILITOT 0.8  PROT 6.4*  ALBUMIN 2.4*     Lipase     Component Value Date/Time   LIPASE 25 05/25/2015 1639     Studies/Results: Ct Abdomen Pelvis Wo Contrast  05/25/2015  CLINICAL DATA:  Left lower quadrant pain EXAM: CT ABDOMEN AND PELVIS WITHOUT CONTRAST TECHNIQUE: Multidetector CT imaging of the abdomen and pelvis was performed following the standard protocol without IV contrast. COMPARISON:  05/02/2015 FINDINGS: Lower chest: Multi chamber cardiac enlargement is again noted. There is a moderate pericardial effusion present, similar to previous exam. No pleural  effusion. Hepatobiliary: No focal liver abnormality identified on this unenhanced exam. Previous cholecystectomy. No biliary dilatation. Pancreas: No inflammation or mass identified. Spleen: The spleen appears normal. Adrenals/Urinary Tract: The adrenal glands appear normal. Bilateral renal cortical scarring and volume loss. There are bilateral renal cysts of varying density which are incompletely characterized without IV contrast material. No hydronephrosis or hydroureter. The urinary bladder appears normal. Stomach/Bowel: The stomach is normal. The small bowel loops have a normal caliber without obstruction. The proximal colon is unremarkable. There is wall thickening and inflammation involving the sigmoid colon compatible with acute sigmoid diverticulitis. Extraluminal collection of gas and phlegmon has increased in size from previous exam now measuring 3.9 x 2.5, image 60 of series 2. Previous 2.1 x 0.7 cm. There are several additional, suspicious collections of gas and fluid within the left lower quadrant of the abdomen, for example image 68 of series 2 and image 62 of series 2. Without enteric contrast material I cannot distinguish between unopacified bowel loops and potential abscess. No pathologic dilatation of the bowel loops. Vascular/Lymphatic: Calcified atherosclerotic disease involves the abdominal aorta. No aneurysm. No enlarged retroperitoneal or mesenteric adenopathy. No enlarged pelvic or inguinal lymph nodes. Reproductive: The uterus and the adnexal structures are unremarkable. Other: There is a small amount of fluid overlying the anterior liver. Free fluid also noted within the left lower quadrant of the abdomen and  tracking along the left pericolic gutter. Musculoskeletal: Degenerative disc disease noted within the lumbar spine. IMPRESSION: 1. There are persistent inflammatory changes of diverticulitis involving the sigmoid colon. 2. Previously noted extra luminal collection of gas is again noted  and is increased in size from previous exam, worrisome for abscess. Additionally, there are several additional suspicious collections of fluid and gas which may represent abscesses, but without enteric contrast material high cannot distinguish this from on opacified bowel loops. A repeat examination with IV, oral and rectal contrast may be helpful to assess for extent of left lower quadrant diverticular abscess formation. 3. Cardiac enlargement and pericardial effusion. 4. Aortic atherosclerosis. Electronically Signed   By: Kerby Moors M.D.   On: 05/25/2015 18:15    Medications: . alvimopan  12 mg Oral To SS-Surg  . bisacodyl  10 mg Rectal Daily  . cefoTEtan (CEFOTAN) 2 GM IVPB  2 g Intravenous To SS-Surg  . fluconazole (DIFLUCAN) IV  200 mg Intravenous Q24H  . insulin aspart  0-9 Units Subcutaneous TID WC  . lactated ringers  1,000 mL Intravenous Once  . lip balm   Topical BID  . nystatin  5 mL Oral QID  . phytonadione  10 mg Subcutaneous Daily  . piperacillin-tazobactam (ZOSYN)  IV  2.25 g Intravenous Q6H  . polyethylene glycol  17 g Oral BID  . potassium chloride  30 mEq Oral BID  . verapamil  120 mg Oral Daily   . dexrose 5 % and 0.45 % NaCl with KCl 30 mEq/L 125 mL/hr at 05/26/15 1205    Assessment/Plan Diverticulitis with perforation  C diff colitis on screen done 05/26/15 Chronic anticoagulation with atrial fibrillation  (INR 1.87 today) Tachy-brady syndrome with PTVP AODM Hypertension Hx of CVA Hyponatremia Renal insuffiencey Leukocytosis  FEN:  IV fluids ID: day 3 Zosyn, day 2 flagyl, diflucan, and neomycin tabs started yesterday VTE:  INR 1.87/SCD  Plan:  Teaching service is aware of C Diff colitis and plan to add PO Vancomycin.  I told the family Dr. Georgette Dover would see her later.  I would think we need to treat the C diff for a time before colectomy.  Maximize her physical status before surgery.         LOS: 2 days    [name] 05/27/2015 [phone]

## 2015-05-27 NOTE — Progress Notes (Addendum)
PARENTERAL NUTRITION CONSULT NOTE - INITIAL  Pharmacy Consult for tpn Indication: diverticulitis with perf  Allergies  Allergen Reactions  . Anti-Inflammatory Enzyme [Nutritional Supplements] Other (See Comments)    Retains fluids and headaches  . Iodinated Diagnostic Agents Hives    HIVES 15MIN S/P IV CONTRAST INJECTION,WILL NEED 13 HR PREP FOR FUTURE INJECTIONS, ok s/p 50mg  po benadryl//a.calhoun  . Spironolactone Other (See Comments)    Hair loss  . Arthrotec [Diclofenac-Misoprostol] Other (See Comments)    unknown  . Biaxin [Clarithromycin] Other (See Comments)    unknown  . Plavix [Clopidogrel Bisulfate] Other (See Comments)    unknown    Patient Measurements: Height: 5\' 5"  (165.1 cm) (05/25/15 @ 1352) Weight: 124 lb 9.6 oz (56.518 kg) (05/25/15 @ 1352) IBW/kg (Calculated) : 57 Adjusted Body Weight:  Usual Weight: 63 kg (dry weight)  Vital Signs: Temp: 98.3 F (36.8 C) (05/01 1600) Temp Source: Oral (05/01 1600) BP: 98/49 mmHg (05/01 1600) Pulse Rate: 68 (05/01 1600) Intake/Output from previous day: 04/30 0701 - 05/01 0700 In: 1459.6 [P.O.:720; I.V.:739.6] Out: 0  Intake/Output from this shift:    Labs:  Recent Labs  05/25/15 1639 05/25/15 2205 05/26/15 0551 05/27/15 0530 05/27/15 0706  WBC 27.5*  --  28.3*  --  24.7*  HGB 10.4*  --  11.0*  --  9.7*  HCT 33.8*  --  34.6*  --  30.5*  PLT 470*  --  494*  --  432*  INR  --  3.01* 3.32* 1.87*  --      Recent Labs  05/25/15 1639 05/26/15 0551 05/26/15 0843 05/27/15 0530 05/27/15 1451  NA 133* 132*  --  128*  --   K 3.0* 3.2*  --  4.8  --   CL 84* 85*  --  86*  --   CO2 35* 33*  --  30  --   GLUCOSE 111* 175*  --  147*  --   BUN 37* 42*  --  45*  --   CREATININE 1.44* 1.68*  --  2.04*  --   CALCIUM 8.8* 8.6*  --  8.5*  --   PROT 6.4*  --   --   --   --   ALBUMIN 2.4*  --   --   --   --   AST 27  --   --   --   --   ALT 21  --   --   --   --   ALKPHOS 109  --   --   --   --   BILITOT 0.8  --    --   --   --   PREALBUMIN  --   --  6.6*  --  4.5*   Estimated Creatinine Clearance: 16.3 mL/min (by C-G formula based on Cr of 2.04).    Recent Labs  05/26/15 1945 05/27/15 0747 05/27/15 1144  GLUCAP 87 148* 115*    Medical History: Past Medical History  Diagnosis Date  . Pericardial effusion     a. s/p pericardial window 01/16/15  . History of GI bleed     a. secondary to AVM's. Treated with Fe infusion  . HTN (hypertension)   . Obesity   . LVH (left ventricular hypertrophy)   . Sleep apnea     mild-no cpap  . Positive TB test   . Anemia   . Stroke [facility])     a. 2013: right frontal  . Arthritis   .  Tachycardia-bradycardia syndrome [facility])     a. s/p [employer] [place], model number [id number], serial number [id number] H 12/2013  . History of shingles   . Chronic diastolic (congestive) heart failure ([place])     a. Echo 01/2015 with EF of 55-60%.  . Chronic atrial fibrillation ([facility])     a. on Coumadin, Digoxin, and Verapamil    Medications:  Scheduled:  . fluconazole (DIFLUCAN) IV  200 mg Intravenous Q24H  . insulin aspart  0-9 Units Subcutaneous TID WC  . lactated ringers  1,000 mL Intravenous Once  . lip balm   Topical BID  . metronidazole  500 mg Intravenous Q8H  . nystatin  5 mL Oral QID  . piperacillin-tazobactam (ZOSYN)  IV  2.25 g Intravenous Q6H  . potassium chloride  30 mEq Oral BID  . vancomycin  125 mg Oral Q6H  . verapamil  120 mg Oral Daily    Insulin Requirements in the past 24 hours:  1 unit SSI  Current Nutrition:  Clear liquids  Nutritional Goals:  Estimated needs are 1700-1950 kcal, 85-115 g protein F/U RD recommendations  Assessment: 82 y/o female with several recent admissions for diverticulitis admitted 05/25/2015 with diverticulitis/abscess with microperforation. She has previously been treated conservatively with antibiotics but plan is for colectomy once C diff infection is under control.   Surgeries/Procedures: 4/29 CT abd - persistent  diverticulitis with possible abscess  GI: recurrent diverticulitis with possible abscess - plan for colectomy; albumin 2.4, prealbumin 4.5<<6.6, LBM 5/1 Endo: A1c 5.2, CBGs mostly <150 Lytes: Na 128, K 4.8 (60 meq on 4/30), CoCa 9.4 Renal: SCr up to 2.04, CrCl ~16 ml/min Pulm: RA Cards: BP soft, HR 60's Hepatobil: LFTs wnl, Tbili 0.8 Neuro:  ID: Zosyn, metronidazole, fluconazole for diverticulitis; C diff + on vancomycin PO; WBC 24.7 AC/heme: warfarin PTA for Afib, INR 1.87 - held; Hb 9.7, plt wnl  Best Practices: SCDs  TPN Access: awaiting PICC placement TPN start date:   Plan:  Order for tpn placed after 12:00, tpn to begin tomorrow F/U tpn labs F/U RD recommendations   [facility], [place].D., BCPS Clinical Pharmacist Pager: [phone] 05/27/2015 5:43 PM

## 2015-05-28 ENCOUNTER — Encounter: Payer: Self-pay | Admitting: Emergency Medicine

## 2015-05-28 ENCOUNTER — Inpatient Hospital Stay (HOSPITAL_COMMUNITY): Payer: Medicare Other

## 2015-05-28 DIAGNOSIS — N189 Chronic kidney disease, unspecified: Secondary | ICD-10-CM

## 2015-05-28 DIAGNOSIS — N179 Acute kidney failure, unspecified: Secondary | ICD-10-CM

## 2015-05-28 DIAGNOSIS — E1165 Type 2 diabetes mellitus with hyperglycemia: Secondary | ICD-10-CM

## 2015-05-28 DIAGNOSIS — E440 Moderate protein-calorie malnutrition: Secondary | ICD-10-CM

## 2015-05-28 DIAGNOSIS — K5720 Diverticulitis of large intestine with perforation and abscess without bleeding: Principal | ICD-10-CM

## 2015-05-28 DIAGNOSIS — H9190 Unspecified hearing loss, unspecified ear: Secondary | ICD-10-CM

## 2015-05-28 DIAGNOSIS — A047 Enterocolitis due to Clostridium difficile: Secondary | ICD-10-CM

## 2015-05-28 DIAGNOSIS — Z950 Presence of cardiac pacemaker: Secondary | ICD-10-CM

## 2015-05-28 LAB — GLUCOSE, CAPILLARY
GLUCOSE-CAPILLARY: 90 mg/dL (ref 65–99)
GLUCOSE-CAPILLARY: 106 mg/dL — AB (ref 65–99)
Glucose-Capillary: 112 mg/dL — ABNORMAL HIGH (ref 65–99)
Glucose-Capillary: 109 mg/dL — ABNORMAL HIGH (ref 65–99)
Glucose-Capillary: 145 mg/dL — ABNORMAL HIGH (ref 65–99)

## 2015-05-28 LAB — CBC
HEMATOCRIT: 30.5 % — AB (ref 36.0–46.0)
HEMOGLOBIN: 9.8 g/dL — AB (ref 12.0–15.0)
MCH: 26.3 pg (ref 26.0–34.0)
MCHC: 32.1 g/dL (ref 30.0–36.0)
MCV: 82 fL (ref 78.0–100.0)
Platelets: 398 10*3/uL (ref 150–400)
RBC: 3.72 MIL/uL — AB (ref 3.87–5.11)
RDW: 15 % (ref 11.5–15.5)
WBC: 17.2 10*3/uL — ABNORMAL HIGH (ref 4.0–10.5)

## 2015-05-28 LAB — DIFFERENTIAL
BASOS PCT: 0 %
Basophils Absolute: 0 10*3/uL (ref 0.0–0.1)
Eosinophils Absolute: 0.1 10*3/uL (ref 0.0–0.7)
Eosinophils Relative: 1 %
Lymphocytes Relative: 6 %
Lymphs Abs: 0.9 10*3/uL (ref 0.7–4.0)
MONO ABS: 0.9 10*3/uL (ref 0.1–1.0)
MONOS PCT: 5 %
NEUTROS ABS: 15.3 10*3/uL — AB (ref 1.7–7.7)
Neutrophils Relative %: 88 %

## 2015-05-28 LAB — COMPREHENSIVE METABOLIC PANEL
ALK PHOS: 82 U/L (ref 38–126)
ALT: 15 U/L (ref 14–54)
AST: 17 U/L (ref 15–41)
Albumin: 2.1 g/dL — ABNORMAL LOW (ref 3.5–5.0)
Anion gap: 10 (ref 5–15)
BILIRUBIN TOTAL: 0.8 mg/dL (ref 0.3–1.2)
BUN: 37 mg/dL — ABNORMAL HIGH (ref 6–20)
CALCIUM: 8.3 mg/dL — AB (ref 8.9–10.3)
CO2: 29 mmol/L (ref 22–32)
CREATININE: 1.72 mg/dL — AB (ref 0.44–1.00)
Chloride: 91 mmol/L — ABNORMAL LOW (ref 101–111)
GFR, EST AFRICAN AMERICAN: 29 mL/min — AB (ref >60)
GFR, EST NON AFRICAN AMERICAN: 25 mL/min — AB (ref >60)
Glucose, Bld: 89 mg/dL (ref 65–99)
Potassium: 4.5 mmol/L (ref 3.5–5.1)
Sodium: 130 mmol/L — ABNORMAL LOW (ref 135–145)
Total Protein: 5.5 g/dL — ABNORMAL LOW (ref 6.5–8.1)

## 2015-05-28 LAB — PROTIME-INR
INR: 1.57 — ABNORMAL HIGH (ref 0.00–1.49)
Prothrombin Time: 18.8 seconds — ABNORMAL HIGH (ref 11.6–15.2)

## 2015-05-28 LAB — TRIGLYCERIDES: Triglycerides: 54 mg/dL (ref <150)

## 2015-05-28 LAB — MAGNESIUM: Magnesium: 1.6 mg/dL — ABNORMAL LOW (ref 1.7–2.4)

## 2015-05-28 LAB — PHOSPHORUS: Phosphorus: 2.9 mg/dL (ref 2.5–4.6)

## 2015-05-28 MED ORDER — SODIUM CHLORIDE 0.9% FLUSH
10.0000 mL | INTRAVENOUS | Status: DC | PRN
  Administered 2015-06-05: 10 mL
  Administered 2015-06-06 – 2015-06-10 (×3): 20 mL
  Filled 2015-05-28 (×4): qty 40

## 2015-05-28 MED ORDER — MAGNESIUM SULFATE IN D5W 1-5 GM/100ML-% IV SOLN
1.0000 g | Freq: Once | INTRAVENOUS | Status: AC
  Administered 2015-05-28: 1 g via INTRAVENOUS
  Filled 2015-05-28: qty 100

## 2015-05-28 MED ORDER — SODIUM CHLORIDE 0.9% FLUSH
10.0000 mL | Freq: Two times a day (BID) | INTRAVENOUS | Status: DC
  Administered 2015-05-28 – 2015-05-31 (×8): 10 mL
  Administered 2015-06-01: 30 mL
  Administered 2015-06-01 – 2015-06-02 (×2): 10 mL
  Administered 2015-06-02: 30 mL
  Administered 2015-06-03 – 2015-06-14 (×10): 10 mL

## 2015-05-28 MED ORDER — HEPARIN SODIUM (PORCINE) 5000 UNIT/ML IJ SOLN
5000.0000 [IU] | Freq: Three times a day (TID) | INTRAMUSCULAR | Status: DC
  Administered 2015-05-28 – 2015-05-29 (×2): 5000 [IU] via SUBCUTANEOUS
  Filled 2015-05-28 (×3): qty 1

## 2015-05-28 MED ORDER — FAT EMULSION 20 % IV EMUL
240.0000 mL | INTRAVENOUS | Status: AC
  Administered 2015-05-28: 240 mL via INTRAVENOUS
  Filled 2015-05-28: qty 250

## 2015-05-28 MED ORDER — TRACE MINERALS CR-CU-MN-SE-ZN 10-1000-500-60 MCG/ML IV SOLN
INTRAVENOUS | Status: AC
  Administered 2015-05-28: 18:00:00 via INTRAVENOUS
  Filled 2015-05-28: qty 960

## 2015-05-28 MED ORDER — SODIUM CHLORIDE 0.9 % IV SOLN
INTRAVENOUS | Status: AC
  Administered 2015-05-28 – 2015-05-29 (×3): via INTRAVENOUS

## 2015-05-28 NOTE — Progress Notes (Signed)
1 Day Post-Op  Subjective: No real change, ongoing diarrhea, and she says it's black.  Still tender with palpable mass LLQ.  Objective: Vital signs in last 24 hours: Temp:  [98 F (36.7 C)-98.3 F (36.8 C)] 98.2 F (36.8 C) (05/02 0800) Pulse Rate:  [59-73] 59 (05/02 0800) Resp:  [18-27] 21 (05/02 0800) BP: (84-113)/(48-57) 113/57 mmHg (05/02 1129) SpO2:  [92 %-99 %] 92 % (05/02 0800) Weight:  [64.4 kg (141 lb 15.6 oz)] 64.4 kg (141 lb 15.6 oz) (05/01 1931) Last BM Date: 05/27/15 NPO Voided x 1 recorded 4 stools recorded yesterday 8 on Sunday   Afebrile, BP down to 84/48 last PM, back up this AM. No tachycardia Creatinine is better, WBC is coming down, INR 1.57 PICC line in TNA starting Intake/Output from previous day: 05/01 0701 - 05/02 0700 In: [id number] [I.V.:1375; IV Piggyback:450] Out: -  Intake/Output this shift:    General appearance: alert, cooperative and no acute pain. she is very hard of hearing GI: soft, tender LLQ and she can feel mass LLQ.    Lab Results:   Recent Labs  05/27/15 0706 05/28/15 0954  WBC 24.7* 17.2*  HGB 9.7* 9.8*  HCT 30.5* 30.5*  PLT 432* 398    BMET  Recent Labs  05/27/15 0530 05/28/15 0954  NA 128* 130*  K 4.8 4.5  CL 86* 91*  CO2 30 29  GLUCOSE 147* 89  BUN 45* 37*  CREATININE 2.04* 1.72*  CALCIUM 8.5* 8.3*   PT/INR  Recent Labs  05/27/15 0530 05/28/15 0954  LABPROT 21.5* 18.8*  INR 1.87* 1.57*     Recent Labs Lab 05/25/15 1639 05/28/15 0954  AST 27 17  ALT 21 15  ALKPHOS 109 82  BILITOT 0.8 0.8  PROT 6.4* 5.5*  ALBUMIN 2.4* 2.1*     Lipase     Component Value Date/Time   LIPASE 25 05/25/2015 1639     Studies/Results: Dg Chest Port 1 View  05/28/2015  CLINICAL DATA:  PICC line placement EXAM: PORTABLE CHEST 1 VIEW COMPARISON:  05/01/2015 FINDINGS: Cardiomegaly again noted. Three leads cardiac pacemaker is unchanged in position. No acute infiltrate or pulmonary edema. There is left arm PICC line with  tip in SVC right atrium junction. No pneumothorax. IMPRESSION: Cardiomegaly. No active disease. Left arm PICC line in place. No pneumothorax. Electronically Signed   By: Lahoma Crocker M.D.   On: 05/28/2015 10:22    Medications: . fluconazole (DIFLUCAN) IV  200 mg Intravenous Q24H  . insulin aspart  0-9 Units Subcutaneous TID WC  . lactated ringers  1,000 mL Intravenous Once  . lip balm   Topical BID  . magnesium sulfate 1 - 4 g bolus IVPB  1 g Intravenous Once  . metronidazole  500 mg Intravenous Q8H  . nystatin  5 mL Oral QID  . piperacillin-tazobactam (ZOSYN)  IV  2.25 g Intravenous Q6H  . potassium chloride  30 mEq Oral BID  . sodium chloride flush  10-40 mL Intracatheter Q12H  . vancomycin  125 mg Oral Q6H  . verapamil  120 mg Oral Daily   . sodium chloride 125 mL/hr at 05/28/15 0959  . sodium chloride    . Marland KitchenTPN (CLINIMIX-E) Adult     And  . fat emulsion     Assessment/Plan Diverticulitis with perforation  C diff colitis on screen done 05/26/15 Chronic anticoagulation with atrial fibrillation (INR 1.87 today) Tachy-brady syndrome with PTVP AODM Hypertension Hx of CVA Hyponatremia Renal insuffiencey Leukocytosis  FEN: clears/TNA ID: day 4 antibiotics;  day 3 flagyl, diflucan, PO vancomycin started 05/27/15 day 2 VTE: INR 1.57/SCD - ? heparin drip ?    Plan:  I talked with the Dr. Meredeth Ide, and recommended they put her on heparin now that the INR is down.  Also recommended ID consult, with this difficult problem.  She has TNA/PICC line now.  If there is a way to improve her diverticulitis without surgery that should be explored.         LOS: 3 days    Suan Pyeatt 05/28/2015 [phone]

## 2015-05-28 NOTE — Consult Note (Signed)
[name]  Date of Admission:  05/25/2015  Date of Consult:  05/28/2015  Reason for Consult: Perforated diverticulitis Referring Physician: Aurther Loft  Impression/Recommendation Perforated diverticulitis with abscess Would continue her anti-fungal, stop flagyl, continue vanco/zosyn Would consider drainage of her abscess (surgical or IR). Although would be conerned that IR drainage will not change her perforation.  She states she would like surgery.   C diff  PCR+ Will continue po vanco Plan for prolonged taper Can stop IV flagyl  ARF on CRI Improving, down to 1.72   Thank you so much for this interesting consult,   [name] (pager) [phone] [email or web address]  [name] is an 82 y.o. female.  HPI: 82 yo F with hx of a-fib, pacer, DM2, OSA. She was adm February 2017 for diverticulitis. SHe was treated with IV atbx in hospital, then transitioned to po augmentin. She returned 3-8 to 3-16 with abd pain and fever. She was again felt to have diverticulitis. She was treated with IV atbx, and again d/c home with augmentin. Her course was also complicated by C diff (treated with po vanco).   She returned 4-5 to 4-12 with CHF exacerbation and golf ball size mass in LLQ. She was noted to have perforation in her sigmoid mesentery but drainable collection. She was treated with IV atbx in hospital, home on augmentin (to complete 4-15).   She returns 4-29 with abd pain, weakness, fatigue, anorexia. She was found on CT to have: 1. There are persistent inflammatory changes of diverticulitis involving the sigmoid colon. 2. Previously noted extra luminal collection of gas is again noted and is increased in size from previous exam, worrisome for abscess. Additionally, there are several additional suspicious collections of fluid and gas which may represent abscesses, but without enteric contrast material high cannot distinguish this from on  opacified bowel loops. A repeat examination with IV, oral and rectal contrast may be helpful to assess for extent of left lower quadrant diverticular abscess formation. Her WBC was 27.5 and she was afebrile. She was started on vanco IV and PO/zosyn/flagyl/fluconazole.  Her C diff is +, she is having loose stools.  She is being considered for surgery (has been on coumadin).  Her WBC has improved, she has remained afebrile.   Past Medical History  Diagnosis Date  . Pericardial effusion     a. s/p pericardial window 01/16/15  . History of GI bleed     a. secondary to AVM's. Treated with Fe infusion  . HTN (hypertension)   . Obesity   . LVH (left ventricular hypertrophy)   . Sleep apnea     mild-no cpap  . Positive TB test   . Anemia   . Stroke [facility])     a. 2013: right frontal  . Arthritis   . Tachycardia-bradycardia syndrome [facility])     a. s/p [employer]onic [place], model number [id number], serial number [id number] H 12/2013  . History of shingles   . Chronic diastolic (congestive) heart failure ([place])     a. Echo 01/2015 with EF of 55-60%.  . Chronic atrial fibrillation (HCC)     a. on Coumadin, Digoxin, and Verapamil    Past Surgical History  Procedure Laterality Date  . Breast lumpectomy Bilateral     negative for cancer  . Esophagogastroduodenoscopy  05/19/2011    Procedure: ESOPHAGOGASTRODUODENOSCOPY (EGD);  Surgeon: Lear Ng, MD;  Location: [facility] ENDOSCOPY;  Service: Endoscopy;  Laterality: N/A;  doctor aware of inr  will try to be here no later than 230  . Cholecystectomy  2005  . Esophagogastroduodenoscopy  06/22/2011    Procedure: ESOPHAGOGASTRODUODENOSCOPY (EGD);  Surgeon: Arta Silence, MD;  Location: [facility] ENDOSCOPY;  Service: Endoscopy;  Laterality: N/A;  Check PT/INR in am  . Givens capsule study  06/23/2011    Procedure: GIVENS CAPSULE STUDY;  Surgeon: Arta Silence, MD;  Location: [facility] ENDOSCOPY;  Service: Endoscopy;  Laterality: N/A;  . Colonoscopy  08/11/2011     Procedure: COLONOSCOPY;  Surgeon: Jeryl Columbia, MD;  Location: [facility] ENDOSCOPY;  Service: Endoscopy;  Laterality: N/A;  . Hot hemostasis  08/11/2011    Procedure: HOT HEMOSTASIS (ARGON PLASMA COAGULATION/BICAP);  Surgeon: Jeryl Columbia, MD;  Location: [facility] ENDOSCOPY;  Service: Endoscopy;  Laterality: N/A;  . Mass excision Left 09/14/2013    Procedure: EXCISION MASS LEFT WRIST;  Surgeon: Leanora Cover, MD;  Location: [facility];  Service: Orthopedics;  Laterality: Left;  . Tonsillectomy and adenoidectomy  1950  . Insert / replace / remove pacemaker  2001    Last generator in 2006; interrogated Dec 2012  . Cataract extraction w/ intraocular lens  implant, bilateral Bilateral   . Lipoma excision Left 07/2013    wrist  . Pacemaker generator change N/A 01/02/2014    Procedure: PACEMAKER GENERATOR CHANGE;  Surgeon: Sanda Klein, MD;  Location: [facility] CATH LAB;  Service: Cardiovascular;  Laterality: N/A;  . Lead revision N/A 01/02/2014    Procedure: LEAD REVISION;  Surgeon: Sanda Klein, MD;  Location: [facility] CATH LAB;  Service: Cardiovascular;  Laterality: N/A;  . Subxyphoid pericardial window N/A 01/16/2015    Procedure: SUBXYPHOID PERICARDIAL WINDOW;  Surgeon: Grace Isaac, MD;  Location: [facility];  Service: Thoracic;  Laterality: N/A;  . Tee without cardioversion N/A 01/16/2015    Procedure: TRANSESOPHAGEAL ECHOCARDIOGRAM (TEE);  Surgeon: Grace Isaac, MD;  Location: [facility] OR;  Service: Thoracic;  Laterality: N/A;     Allergies  Allergen Reactions  . Anti-Inflammatory Enzyme [Nutritional Supplements] Other (See Comments)    Retains fluids and headaches  . Iodinated Diagnostic Agents Hives    HIVES 15MIN S/P IV CONTRAST INJECTION,WILL NEED 13 HR PREP FOR FUTURE INJECTIONS, ok s/p '50mg'$  po benadryl//a.calhoun  . Spironolactone Other (See Comments)    Hair loss  . Arthrotec [Diclofenac-Misoprostol] Other (See Comments)    unknown  . Biaxin [Clarithromycin] Other (See Comments)    unknown    . Plavix [Clopidogrel Bisulfate] Other (See Comments)    unknown    Medications:  Scheduled: . fluconazole (DIFLUCAN) IV  200 mg Intravenous Q24H  . heparin subcutaneous  5,000 Units Subcutaneous Q8H  . insulin aspart  0-9 Units Subcutaneous TID WC  . lactated ringers  1,000 mL Intravenous Once  . lip balm   Topical BID  . metronidazole  500 mg Intravenous Q8H  . piperacillin-tazobactam (ZOSYN)  IV  2.25 g Intravenous Q6H  . potassium chloride  30 mEq Oral BID  . sodium chloride flush  10-40 mL Intracatheter Q12H  . vancomycin  125 mg Oral Q6H  . verapamil  120 mg Oral Daily    Abtx:  Anti-infectives    Start     Dose/Rate Route Frequency Ordered Stop   05/27/15 1200  piperacillin-tazobactam (ZOSYN) IVPB 2.25 g     2.25 g 100 mL/hr over 30 Minutes Intravenous Every 6 hours 05/27/15 0832     05/27/15 1200  vancomycin (VANCOCIN) 50 mg/mL oral solution 125 mg     125 mg  Oral Every 6 hours 05/27/15 0957     05/27/15 1000  metroNIDAZOLE (FLAGYL) IVPB 500 mg     500 mg 100 mL/hr over 60 Minutes Intravenous Every 8 hours 05/27/15 0957     05/27/15 0600  cefoTEtan (CEFOTAN) 2 g in dextrose 5 % 50 mL IVPB  Status:  Discontinued     2 g 100 mL/hr over 30 Minutes Intravenous To [facility] 05/26/15 0832 05/27/15 1051   05/26/15 1300  metroNIDAZOLE (FLAGYL) tablet 500 mg  Status:  Discontinued     500 mg Oral 3 times per day on Sun 05/26/15 0928 05/26/15 0931   05/26/15 1300  neomycin (MYCIFRADIN) tablet 1,000 mg     1,000 mg Oral 3 times per day on Sun 05/26/15 0930 05/26/15 2139   05/26/15 1300  metroNIDAZOLE (FLAGYL) tablet 1,000 mg     1,000 mg Oral 3 times per day on Sun 05/26/15 0931 05/26/15 2139   05/26/15 0832  metroNIDAZOLE (FLAGYL) tablet 500 mg  Status:  Discontinued    Comments:  Take 2 pills (='1000mg'$ ) by mouth at 1pm, 3pm, and 10pm the day before your colorectal operation   500 mg Oral As directed 05/26/15 0832 05/26/15 0925   05/26/15 0832  neomycin (MYCIFRADIN)  tablet 500 mg  Status:  Discontinued     500 mg Oral As directed 05/26/15 3235 05/26/15 0930   05/26/15 0815  fluconazole (DIFLUCAN) IVPB 200 mg     200 mg 100 mL/hr over 60 Minutes Intravenous Every 24 hours 05/26/15 0811     05/26/15 0600  piperacillin-tazobactam (ZOSYN) IVPB 3.375 g  Status:  Discontinued     3.375 g 12.5 mL/hr over 240 Minutes Intravenous Every 8 hours 05/25/15 2155 05/27/15 0832   05/25/15 1900  piperacillin-tazobactam (ZOSYN) IVPB 3.375 g     3.375 g 100 mL/hr over 30 Minutes Intravenous  Once 05/25/15 1851 05/25/15 2002      Total days of antibiotics: 3 vanco/flagyl/zosyn/diflucan          Social History:  reports that she has never smoked. She has never used smokeless tobacco. She reports that she drinks alcohol. She reports that she does not use illicit drugs.  Family History  Problem Relation Age of Onset  . Stroke Mother   . Stroke Father   . Pneumonia Father   . Colon cancer Sister   . Colon cancer Daughter     General ROS: hungry. no fever. + diarrhea, black BM. no dysuria. + mass in LLQ. see HPI.   Blood pressure 111/56, pulse 64, temperature 98 F (36.7 C), temperature source Oral, resp. rate 20, height '5\' 5"'$  (1.651 m), weight 64.4 kg (141 lb 15.6 oz), SpO2 97 %. General appearance: alert, cooperative and no distress Eyes: negative findings: conjunctivae and sclerae normal and pupils equal, round, reactive to light and accomodation Throat: normal findings: oropharynx pink & moist without lesions or evidence of thrush Neck: no adenopathy and supple, symmetrical, trachea midline Lungs: clear to auscultation bilaterally Heart: regular rate and rhythm Abdomen: normal findings: bowel sounds normal and abnormal findings:  guarding, hypoactive bowel sounds and mass, located in the LLQ Extremities: edema 3+ BLE. LUE PIC.    Results for orders placed or performed during the hospital encounter of 05/25/15 (from the past 48 hour(s))  Glucose, capillary      Status: Abnormal   Collection Time: 05/26/15  5:00 PM  Result Value Ref Range   Glucose-Capillary 158 (H) 65 - 99 mg/dL  Glucose, capillary  Status: None   Collection Time: 05/26/15  7:45 PM  Result Value Ref Range   Glucose-Capillary 87 65 - 99 mg/dL  Protime-INR     Status: Abnormal   Collection Time: 05/27/15  5:30 AM  Result Value Ref Range   Prothrombin Time 21.5 (H) 11.6 - 15.2 seconds   INR 1.87 (H) 0.00 - 1.49  Basic metabolic panel     Status: Abnormal   Collection Time: 05/27/15  5:30 AM  Result Value Ref Range   Sodium 128 (L) 135 - 145 mmol/L   Potassium 4.8 3.5 - 5.1 mmol/L    Comment: DELTA CHECK NOTED   Chloride 86 (L) 101 - 111 mmol/L   CO2 30 22 - 32 mmol/L   Glucose, Bld 147 (H) 65 - 99 mg/dL   BUN 45 (H) 6 - 20 mg/dL   Creatinine, Ser 4.86 (H) 0.44 - 1.00 mg/dL   Calcium 8.5 (L) 8.9 - 10.3 mg/dL   GFR calc non Af Amer 20 (L) >60 mL/min   GFR calc Af Amer 24 (L) >60 mL/min    Comment: (NOTE) The eGFR has been calculated using the CKD EPI equation. This calculation has not been validated in all clinical situations. eGFR's persistently <60 mL/min signify possible Chronic Kidney Disease.    Anion gap 12 5 - 15  CBC     Status: Abnormal   Collection Time: 05/27/15  7:06 AM  Result Value Ref Range   WBC 24.7 (H) 4.0 - 10.5 K/uL   RBC 3.71 (L) 3.87 - 5.11 MIL/uL   Hemoglobin 9.7 (L) 12.0 - 15.0 g/dL   HCT 01.9 (L) 47.8 - 65.4 %   MCV 82.2 78.0 - 100.0 fL   MCH 26.1 26.0 - 34.0 pg   MCHC 31.8 30.0 - 36.0 g/dL   RDW 56.1 32.7 - 35.3 %   Platelets 432 (H) 150 - 400 K/uL  Glucose, capillary     Status: Abnormal   Collection Time: 05/27/15  7:47 AM  Result Value Ref Range   Glucose-Capillary 148 (H) 65 - 99 mg/dL  Glucose, capillary     Status: Abnormal   Collection Time: 05/27/15 11:44 AM  Result Value Ref Range   Glucose-Capillary 115 (H) 65 - 99 mg/dL  Prealbumin     Status: Abnormal   Collection Time: 05/27/15  2:51 PM  Result Value Ref Range     Prealbumin 4.5 (L) 18 - 38 mg/dL  Glucose, capillary     Status: Abnormal   Collection Time: 05/27/15  5:19 PM  Result Value Ref Range   Glucose-Capillary 138 (H) 65 - 99 mg/dL  MRSA PCR Screening     Status: None   Collection Time: 05/27/15  7:06 PM  Result Value Ref Range   MRSA by PCR NEGATIVE NEGATIVE    Comment:        The GeneXpert MRSA Assay (FDA approved for NASAL specimens only), is one component of a comprehensive MRSA colonization surveillance program. It is not intended to diagnose MRSA infection nor to guide or monitor treatment for MRSA infections.   Glucose, capillary     Status: None   Collection Time: 05/27/15 10:10 PM  Result Value Ref Range   Glucose-Capillary 90 65 - 99 mg/dL  Glucose, capillary     Status: Abnormal   Collection Time: 05/28/15  8:28 AM  Result Value Ref Range   Glucose-Capillary 112 (H) 65 - 99 mg/dL  Protime-INR     Status: Abnormal   Collection  Time: 05/28/15  9:54 AM  Result Value Ref Range   Prothrombin Time 18.8 (H) 11.6 - 15.2 seconds   INR 1.57 (H) 0.00 - 1.49  CBC     Status: Abnormal   Collection Time: 05/28/15  9:54 AM  Result Value Ref Range   WBC 17.2 (H) 4.0 - 10.5 K/uL   RBC 3.72 (L) 3.87 - 5.11 MIL/uL   Hemoglobin 9.8 (L) 12.0 - 15.0 g/dL   HCT 30.5 (L) 36.0 - 46.0 %   MCV 82.0 78.0 - 100.0 fL   MCH 26.3 26.0 - 34.0 pg   MCHC 32.1 30.0 - 36.0 g/dL   RDW 15.0 11.5 - 15.5 %   Platelets 398 150 - 400 K/uL  Comprehensive metabolic panel     Status: Abnormal   Collection Time: 05/28/15  9:54 AM  Result Value Ref Range   Sodium 130 (L) 135 - 145 mmol/L   Potassium 4.5 3.5 - 5.1 mmol/L   Chloride 91 (L) 101 - 111 mmol/L   CO2 29 22 - 32 mmol/L   Glucose, Bld 89 65 - 99 mg/dL   BUN 37 (H) 6 - 20 mg/dL   Creatinine, Ser 1.72 (H) 0.44 - 1.00 mg/dL   Calcium 8.3 (L) 8.9 - 10.3 mg/dL   Total Protein 5.5 (L) 6.5 - 8.1 g/dL   Albumin 2.1 (L) 3.5 - 5.0 g/dL   AST 17 15 - 41 U/L   ALT 15 14 - 54 U/L   Alkaline  Phosphatase 82 38 - 126 U/L   Total Bilirubin 0.8 0.3 - 1.2 mg/dL   GFR calc non Af Amer 25 (L) >60 mL/min   GFR calc Af Amer 29 (L) >60 mL/min    Comment: (NOTE) The eGFR has been calculated using the CKD EPI equation. This calculation has not been validated in all clinical situations. eGFR's persistently <60 mL/min signify possible Chronic Kidney Disease.    Anion gap 10 5 - 15  Magnesium     Status: Abnormal   Collection Time: 05/28/15  9:54 AM  Result Value Ref Range   Magnesium 1.6 (L) 1.7 - 2.4 mg/dL  Phosphorus     Status: None   Collection Time: 05/28/15  9:54 AM  Result Value Ref Range   Phosphorus 2.9 2.5 - 4.6 mg/dL  Triglycerides     Status: None   Collection Time: 05/28/15  9:54 AM  Result Value Ref Range   Triglycerides 54 <150 mg/dL  Differential     Status: Abnormal   Collection Time: 05/28/15  9:54 AM  Result Value Ref Range   Neutrophils Relative % 88 %   Neutro Abs 15.3 (H) 1.7 - 7.7 K/uL   Lymphocytes Relative 6 %   Lymphs Abs 0.9 0.7 - 4.0 K/uL   Monocytes Relative 5 %   Monocytes Absolute 0.9 0.1 - 1.0 K/uL   Eosinophils Relative 1 %   Eosinophils Absolute 0.1 0.0 - 0.7 K/uL   Basophils Relative 0 %   Basophils Absolute 0.0 0.0 - 0.1 K/uL  Glucose, capillary     Status: Abnormal   Collection Time: 05/28/15 12:56 PM  Result Value Ref Range   Glucose-Capillary 106 (H) 65 - 99 mg/dL      Component Value Date/Time   SDES URINE, CLEAN CATCH 05/02/2015 0132   SPECREQUEST Immunocompromised 05/02/2015 0132   CULT * 05/02/2015 0132    70,000 COLONIES/mL KLEBSIELLA PNEUMONIAE 60,000 COLONIES/mL CITROBACTER SPECIES    REPTSTATUS 05/04/2015 FINAL 05/02/2015 0132  Dg Chest Port 1 View  05/28/2015  CLINICAL DATA:  PICC line placement EXAM: PORTABLE CHEST 1 VIEW COMPARISON:  05/01/2015 FINDINGS: Cardiomegaly again noted. Three leads cardiac pacemaker is unchanged in position. No acute infiltrate or pulmonary edema. There is left arm PICC line with tip in SVC  right atrium junction. No pneumothorax. IMPRESSION: Cardiomegaly. No active disease. Left arm PICC line in place. No pneumothorax. Electronically Signed   By: Lahoma Crocker M.D.   On: 05/28/2015 10:22   Recent Results (from the past 240 hour(s))  C difficile quick scan w PCR reflex     Status: Abnormal   Collection Time: 05/26/15 11:21 AM  Result Value Ref Range Status   C Diff antigen NEGATIVE NEGATIVE Final   C Diff toxin POSITIVE (A) NEGATIVE Final    Comment: CDIFF PCR WAS RAN AND CONFIRMED POSITIVE CRITICAL RESULT CALLED TO, READ BACK BY AND VERIFIED WITH: R.GREENAWALT,RN 05/26/15 '@1729'$     C Diff interpretation Results are indeterminate. See PCR results.  Final  Clostridium Difficile by PCR     Status: Abnormal   Collection Time: 05/26/15 11:21 AM  Result Value Ref Range Status   Toxigenic C Difficile by pcr POSITIVE (A) NEGATIVE Final    Comment: CRITICAL RESULT CALLED TO, READ BACK BY AND VERIFIED WITH: R.GREENAWALT,RN 05/26/15 '@1730'$  BY V.WILKINS   MRSA PCR Screening     Status: None   Collection Time: 05/27/15  7:06 PM  Result Value Ref Range Status   MRSA by PCR NEGATIVE NEGATIVE Final    Comment:        The GeneXpert MRSA Assay (FDA approved for NASAL specimens only), is one component of a comprehensive MRSA colonization surveillance program. It is not intended to diagnose MRSA infection nor to guide or monitor treatment for MRSA infections.       05/28/2015, 3:41 PM     LOS: 3 days    Records and images were personally reviewed where available.

## 2015-05-28 NOTE — Progress Notes (Signed)
Family Medicine Teaching Service Daily Progress Note Intern Pager: [phone]  Patient name: [name] Medical record number: [id number] Date of birth: 11/17/1923 Age: 82 y.o. Gender: female  Primary Care Provider: [name], MD Consultants: Surgery Code Status: Full  Pt Overview and Major Events to Date:  4/28: Readmit for Diverticulitis with possible perforation / abscess  Assessment and Plan: [name] is a 82 y.o. female presenting with LLQ abdominal pain. PMH is significant for atrial fibrillation, hx of CVA, hypertension, type 2 diabetes, tachycardia-bradycardia syndrome s/p pacemaker, prior pericardial effusion s/p pericardial window 12/16, sleep apnea (no CPAP), stroke (right frontal 2013), GI bleeding with hemorrhoids, and arthritis and recurrent diverticulitis.  Recurrent diverticulitis with possible perforation / abscess: Continues to have multiple stools. CT showed: diverticulitis involving the sigmoid colon with previously noted extra luminal collection of gas is again noted and is increased in size from previous exam, worrisome for abscess. Additionally, there are several additional suspicious collections of fluid and gas which may represent abscesses. WBC 27.5 > 24.7> 17.2.  Patient with positive C. Diff  - Surgery awaiting C. Diff treatment before colectomy, will touch base with them today  - Continue Zosyn for diverticulitis (touch base with surgery )   - Pain control: Morphine 1-2 mg q3hrs prn - Per surgery if prealbumin low (4.5), then start TPN (PICC line being placed this AM with TPN)   C.difficule positive : Continues to have many bowel movements daily  - Continue PO vancomycin 150 mg QID and IV metronidazole 500 mg IV TID  - Will watch for improvement of bowel movement frequency over the next several days, expect TPN to cause some diarrhea as well.  - Contact precautions   Atrial fibrillation: INR supra-therapeutic on 4/26. INR 1.87> 1.57 - Continue  Verapamil and Digoxin  - D/C warfarin  - INR needs to be less than 1.5 post surgery   DM2: Diet contorlled. A1c 6.2 (12/2014)> 6.3  - monitor cbgs; SSI sensitive   Hyponatremia: Na 132> 128>130  - continue NS for now   Oral Thrush - Nystatin   Anemia: Hgb 10.4 on admit (baseline 11). History of diverticulitis and hemorrhoids -  Hgb 9.7> 9.8 stable.   AoCKD stage 3: Baseline Cr 1-1.2. Cr 1.44 > Cr 2.04  > 1.72 - Continue to monitor BMET    FEN/GI: Clear Liquid diet; NS@ 125 ml  Prophylaxis: SCDs  Disposition: Step-down   Subjective:  Patient was able to sleep last night. Grandson concerned however that she has not been able to sleep nights before. Continues have liquid bowel movements frequently.   Objective: Temp:  [98 F (36.7 C)-98.3 F (36.8 C)] 98.2 F (36.8 C) (05/02 0800) Pulse Rate:  [59-73] 59 (05/02 0800) Resp:  [18-27] 21 (05/02 0800) BP: (84-113)/(48-57) 113/57 mmHg (05/02 1129) SpO2:  [92 %-99 %] 92 % (05/02 0800) Weight:  [141 lb 15.6 oz (64.4 kg)] 141 lb 15.6 oz (64.4 kg) (05/01 1931)  Physical Exam: General: NAD, lying in bed  Cardiovascular: regular rate and rhythm with 2/6 murmur Respiratory: CTAB; Normal effort, 2+ radial pulses  Abdomen: Soft, guarding, tenderness to palpation in LLQ Skin: no rash  Laboratory:  Recent Labs Lab 05/26/15 0551 05/27/15 0706 05/28/15 0954  WBC 28.3* 24.7* 17.2*  HGB 11.0* 9.7* 9.8*  HCT 34.6* 30.5* 30.5*  PLT 494* 432* 398    Recent Labs Lab 05/25/15 1639 05/26/15 0551 05/27/15 0530 05/28/15 0954  NA 133* 132* 128* 130*  K 3.0* 3.2*  4.8 4.5  CL 84* 85* 86* 91*  CO2 35* 33* 30 29  BUN 37* 42* 45* 37*  CREATININE 1.44* 1.68* 2.04* 1.72*  CALCIUM 8.8* 8.6* 8.5* 8.3*  PROT 6.4*  --   --  5.5*  BILITOT 0.8  --   --  0.8  ALKPHOS 109  --   --  82  ALT 21  --   --  15  AST 27  --   --  17  GLUCOSE 111* 175* 147* 89    Recent Labs Lab 05/24/15 05/25/15 2205 05/26/15 0551 05/27/15 0530  05/28/15 0954  INR 3.1 3.01* 3.32* 1.87* 1.57*   Imaging/Diagnostic Tests: Ct Abdomen Pelvis Wo Contrast  05/25/2015  IMPRESSION: 1. There are persistent inflammatory changes of diverticulitis involving the sigmoid colon. 2. Previously noted extra luminal collection of gas is again noted and is increased in size from previous exam, worrisome for abscess. Additionally, there are several additional suspicious collections of fluid and gas which may represent abscesses, but without enteric contrast material high cannot distinguish this from on opacified bowel loops. A repeat examination with IV, oral and rectal contrast may be helpful to assess for extent of left lower quadrant diverticular abscess formation. 3. Cardiac enlargement and pericardial effusion. 4. Aortic atherosclerosis. Electronically Signed   By: Kerby Moors M.D.   On: 05/25/2015 18:15   [name] Homeyer Cletis Media, MD 05/28/2015, 12:07 PM PGY-3, [place] Intern pager: [phone], text pages welcome

## 2015-05-28 NOTE — Progress Notes (Signed)
[place] NOTE   Pharmacy Consult for tpn Indication: diverticulitis with perf  Allergies  Allergen Reactions  . Anti-Inflammatory Enzyme [Nutritional Supplements] Other (See Comments)    Retains fluids and headaches  . Iodinated Diagnostic Agents Hives    HIVES 15MIN S/P IV CONTRAST INJECTION,WILL NEED 13 HR PREP FOR FUTURE INJECTIONS, ok s/p 50mg  po benadryl//a.calhoun  . Spironolactone Other (See Comments)    Hair loss  . Arthrotec [Diclofenac-Misoprostol] Other (See Comments)    unknown  . Biaxin [Clarithromycin] Other (See Comments)    unknown  . Plavix [Clopidogrel Bisulfate] Other (See Comments)    unknown    Patient Measurements: Height: 5\' 5"  (165.1 cm) (05/25/15 @ 1352) Weight: 141 lb 15.6 oz (64.4 kg) IBW/kg (Calculated) : 57 Adjusted Body Weight:  Usual Weight: 63 kg (dry weight)  Vital Signs: Temp: 98.2 F (36.8 C) (05/02 0800) Temp Source: Oral (05/02 0800) BP: 106/56 mmHg (05/02 0800) Pulse Rate: 59 (05/02 0800) Intake/Output from previous day: 05/01 0701 - 05/02 0700 In: [id number] [I.V.:1375; IV Piggyback:450] Out: -  Intake/Output from this shift:    Labs:  Recent Labs  05/25/15 1639 05/25/15 2205 05/26/15 0551 05/27/15 0530 05/27/15 0706  WBC 27.5*  --  28.3*  --  24.7*  HGB 10.4*  --  11.0*  --  9.7*  HCT 33.8*  --  34.6*  --  30.5*  PLT 470*  --  494*  --  432*  INR  --  3.01* 3.32* 1.87*  --      Recent Labs  05/25/15 1639 05/26/15 0551 05/26/15 0843 05/27/15 0530 05/27/15 1451  NA 133* 132*  --  128*  --   K 3.0* 3.2*  --  4.8  --   CL 84* 85*  --  86*  --   CO2 35* 33*  --  30  --   GLUCOSE 111* 175*  --  147*  --   BUN 37* 42*  --  45*  --   CREATININE 1.44* 1.68*  --  2.04*  --   CALCIUM 8.8* 8.6*  --  8.5*  --   PROT 6.4*  --   --   --   --   ALBUMIN 2.4*  --   --   --   --   AST 27  --   --   --   --   ALT 21  --   --   --   --   ALKPHOS 109  --   --   --   --   BILITOT 0.8  --   --   --   --    PREALBUMIN  --   --  6.6*  --  4.5*   Estimated Creatinine Clearance: 16.5 mL/min (by C-G formula based on Cr of 2.04).    Recent Labs  05/27/15 1144 05/27/15 1719 05/27/15 2210  GLUCAP 115* 138* 90    Medical History: Past Medical History  Diagnosis Date  . Pericardial effusion     a. s/p pericardial window 01/16/15  . History of GI bleed     a. secondary to AVM's. Treated with Fe infusion  . HTN (hypertension)   . Obesity   . LVH (left ventricular hypertrophy)   . Sleep apnea     mild-no cpap  . Positive TB test   . Anemia   . Stroke [facility])     a. 2013: right frontal  . Arthritis   . Tachycardia-bradycardia syndrome ([place])  a. s/p [employer] [place], model number [id number], serial number [id number] H 12/2013  . History of shingles   . Chronic diastolic (congestive) heart failure ([place])     a. Echo 01/2015 with EF of 55-60%.  . Chronic atrial fibrillation (HCC)     a. on Coumadin, Digoxin, and Verapamil    Medications:  Scheduled:  . fluconazole (DIFLUCAN) IV  200 mg Intravenous Q24H  . insulin aspart  0-9 Units Subcutaneous TID WC  . lactated ringers  1,000 mL Intravenous Once  . lip balm   Topical BID  . metronidazole  500 mg Intravenous Q8H  . nystatin  5 mL Oral QID  . piperacillin-tazobactam (ZOSYN)  IV  2.25 g Intravenous Q6H  . potassium chloride  30 mEq Oral BID  . vancomycin  125 mg Oral Q6H  . verapamil  120 mg Oral Daily    Insulin Requirements in the past 24 hours:  2 units sensitive SSI  Assessment: 82 y/o female with several recent admissions for diverticulitis admitted 05/25/2015 with diverticulitis/abscess with microperforation. She has previously been treated conservatively with antibiotics but plan is for colectomy once C diff infection is under control.   Surgeries/Procedures: 4/29 CT abd - persistent diverticulitis with possible abscess  GI: CT showed diverticulitis involving sigmoid colon worrisome for abscess - plan for colectomy after  C.diff treatment; albumin 2.4, prealbumin 4.5<<6.6, LBM 5/1.  Endo: A1c 5.2, CBGs <150 Lytes: Na 130, K 4.5, Mg 1.6, Phos 2.9, CoCa 9.18 Renal: SCr down to 1.72, CrCl ~20 ml/min, +1.8 L/ 24 hrs  Pulm: RA Cards: BP soft, HR 50's-60's Hepatobil: LFTs wnl, Tbili 0.8 Neuro:  ID: Zosyn, metronidazole, fluconazole for diverticulitis; C diff + on vancomycin PO; WBC 24.7>>17.2 AC/heme: warfarin PTA for Afib, INR 1.87>>1.57 - held; Hb 9.8, plt wnl  Best Practices: SCDs  TPN Access: PICC 5/2>> TPN start date: 5/2>>  Nutritional Goals (per RD recommendations):  Kcal: 1500-1700 Protein: 70-80 gm  Current Nutrition:  Clear liquids  Plan:  -Start Clinimix E 5/15 @ 40 mL/hr and IV lipids at 10 mL/hr. Advance as tolerated  -Add MVI and TE to TPN  -Decrease NS infusion to 85 mL/hr to accommodate TPN infusion rate  -Continue sensitive sliding scale -Magnesium sulfate 1 gm IV once   -F/U tpn labs in AM  -F/U RD recommendations   Albertina Parr, [name]., BCPS Clinical Pharmacist Pager [phone]

## 2015-05-28 NOTE — Progress Notes (Signed)
Initial Nutrition Assessment  DOCUMENTATION CODES:   Non-severe (moderate) malnutrition in context of chronic illness  INTERVENTION:    TPN per pharmacy  NUTRITION DIAGNOSIS:   Inadequate oral intake related to altered GI function as evidenced by NPO status  GOAL:   Patient will meet greater than or equal to 90% of their needs  MONITOR:   Diet advancement, Labs, Weight trends, I & O's (TPN prescription )  REASON FOR ASSESSMENT:   Consult New TPN/TNA  ASSESSMENT:   82 y/o Female with several recent admissions for diverticulitis admitted 05/25/2015 with diverticulitis/abscess with microperforation. She has previously been treated conservatively with antibiotics but plan is for colectomy once C diff infection is under control.  Patient consuming her clear liquid tray upon visit. States her appetite is "coming back". Reports she wasn't eating well for weeks PTA (mostly liquids). Unsure if she's lost weight. Awaiting PICC line placement.  Patient to receive TPN with Clinimix E 5/15 @ 40 ml/hr and lipids @ 10 ml/hr.  Provides 1162 kcal and 48 grams protein per day.  Meets 77% minimum estimated energy needs and 68% minimum estimated protein needs.  Nutrition-Focused physical exam completed. Findings are moderate fat depletion, moderate muscle depletion, and no edema.   Diet Order:  Diet clear liquid Room service appropriate?: Yes; Fluid consistency:: Thin TPN (CLINIMIX-E) Adult  Skin:  Reviewed, no issues  Last BM:  5/1  Height:   Ht Readings from Last 1 Encounters:  05/25/15 5\' 5"  (1.651 m)    Weight:   Wt Readings from Last 1 Encounters:  05/27/15 141 lb 15.6 oz (64.4 kg)    Ideal Body Weight:  56.8 kg  BMI:  Body mass index is 23.63 kg/(m^2).  Estimated Nutritional Needs:   Kcal:  1500-1700  Protein:  70-80 gm  Fluid:  1.5-1.7 L  EDUCATION NEEDS:   No education needs identified at this time  Arthur Holms, RD, LDN Pager #:  [phone] After-Hours Pager #: [phone]

## 2015-05-28 NOTE — Progress Notes (Signed)
Peripherally Inserted Central Catheter/Midline Placement  The IV Nurse has discussed with the patient and/or persons authorized to consent for the patient, the purpose of this procedure and the potential benefits and risks involved with this procedure.  The benefits include less needle sticks, lab draws from the catheter and patient may be discharged home with the catheter.  Risks include, but not limited to, infection, bleeding, blood clot (thrombus formation), and puncture of an artery; nerve damage and irregular heat beat.  Alternatives to this procedure were also discussed.  PICC/Midline Placement Documentation  PICC Double Lumen 05/28/15 PICC Left Brachial 38 cm 1 cm (Active)  Indication for Insertion or Continuance of Line Administration of hyperosmolar/irritating solutions (i.e. TPN, Vancomycin, etc.) 05/28/2015  9:00 AM  Exposed Catheter (cm) 1 cm 05/28/2015  9:00 AM  Dressing Change Due 06/04/15 05/28/2015  9:00 AM       Jule Economy Horton 05/28/2015, 9:32 AM

## 2015-05-29 LAB — GLUCOSE, CAPILLARY
GLUCOSE-CAPILLARY: 122 mg/dL — AB (ref 65–99)
Glucose-Capillary: 105 mg/dL — ABNORMAL HIGH (ref 65–99)
Glucose-Capillary: 142 mg/dL — ABNORMAL HIGH (ref 65–99)
Glucose-Capillary: 140 mg/dL — ABNORMAL HIGH (ref 65–99)

## 2015-05-29 LAB — BASIC METABOLIC PANEL
ANION GAP: 8 (ref 5–15)
BUN: 28 mg/dL — ABNORMAL HIGH (ref 6–20)
CALCIUM: 8 mg/dL — AB (ref 8.9–10.3)
CO2: 26 mmol/L (ref 22–32)
Chloride: 94 mmol/L — ABNORMAL LOW (ref 101–111)
Creatinine, Ser: 1.33 mg/dL — ABNORMAL HIGH (ref 0.44–1.00)
GFR, EST AFRICAN AMERICAN: 39 mL/min — AB (ref >60)
GFR, EST NON AFRICAN AMERICAN: 34 mL/min — AB (ref >60)
Glucose, Bld: 125 mg/dL — ABNORMAL HIGH (ref 65–99)
POTASSIUM: 4.6 mmol/L (ref 3.5–5.1)
SODIUM: 128 mmol/L — AB (ref 135–145)

## 2015-05-29 LAB — MAGNESIUM: MAGNESIUM: 2.1 mg/dL (ref 1.7–2.4)

## 2015-05-29 LAB — CBC
HCT: 29.7 % — ABNORMAL LOW (ref 36.0–46.0)
Hemoglobin: 9.4 g/dL — ABNORMAL LOW (ref 12.0–15.0)
MCH: 25.5 pg — ABNORMAL LOW (ref 26.0–34.0)
MCHC: 31.6 g/dL (ref 30.0–36.0)
MCV: 80.5 fL (ref 78.0–100.0)
PLATELETS: 393 10*3/uL (ref 150–400)
RBC: 3.69 MIL/uL — AB (ref 3.87–5.11)
RDW: 15.1 % (ref 11.5–15.5)
WBC: 13.1 10*3/uL — AB (ref 4.0–10.5)

## 2015-05-29 LAB — PHOSPHORUS: PHOSPHORUS: 2.3 mg/dL — AB (ref 2.5–4.6)

## 2015-05-29 LAB — PROTIME-INR
INR: 1.72 — AB (ref 0.00–1.49)
Prothrombin Time: 20.1 seconds — ABNORMAL HIGH (ref 11.6–15.2)

## 2015-05-29 LAB — HEPARIN LEVEL (UNFRACTIONATED): Heparin Unfractionated: <0.1 IU/mL — ABNORMAL LOW (ref 0.30–0.70)

## 2015-05-29 MED ORDER — SODIUM PHOSPHATES 45 MMOLE/15ML IV SOLN
30.0000 mmol | Freq: Once | INTRAVENOUS | Status: AC
  Administered 2015-05-29: 30 mmol via INTRAVENOUS
  Filled 2015-05-29: qty 10

## 2015-05-29 MED ORDER — TRACE MINERALS CR-CU-MN-SE-ZN 10-1000-500-60 MCG/ML IV SOLN
INTRAVENOUS | Status: AC
  Administered 2015-05-29: 17:00:00 via INTRAVENOUS
  Filled 2015-05-29: qty 1680

## 2015-05-29 MED ORDER — SODIUM CHLORIDE 0.9 % IV SOLN
INTRAVENOUS | Status: DC
  Administered 2015-05-31: 20:00:00 via INTRAVENOUS

## 2015-05-29 MED ORDER — HEPARIN (PORCINE) IN NACL 100-0.45 UNIT/ML-% IJ SOLN
1350.0000 [IU]/h | INTRAMUSCULAR | Status: AC
  Administered 2015-05-29: 800 [IU]/h via INTRAVENOUS
  Administered 2015-05-30: 1350 [IU]/h via INTRAVENOUS
  Filled 2015-05-29 (×2): qty 250

## 2015-05-29 MED ORDER — PIPERACILLIN-TAZOBACTAM 3.375 G IVPB
3.3750 g | Freq: Three times a day (TID) | INTRAVENOUS | Status: DC
  Administered 2015-05-29 – 2015-06-12 (×41): 3.375 g via INTRAVENOUS
  Filled 2015-05-29 (×45): qty 50

## 2015-05-29 MED ORDER — FAT EMULSION 20 % IV EMUL
240.0000 mL | INTRAVENOUS | Status: AC
  Administered 2015-05-29: 240 mL via INTRAVENOUS
  Filled 2015-05-29: qty 250

## 2015-05-29 MED ORDER — ACETAMINOPHEN 325 MG PO TABS
650.0000 mg | ORAL_TABLET | Freq: Four times a day (QID) | ORAL | Status: DC | PRN
  Administered 2015-05-30 – 2015-06-05 (×3): 650 mg via ORAL
  Filled 2015-05-29 (×3): qty 2

## 2015-05-29 MED ORDER — SODIUM CHLORIDE 0.9% FLUSH
10.0000 mL | INTRAVENOUS | Status: DC | PRN
  Administered 2015-06-05 – 2015-06-08 (×2): 10 mL
  Administered 2015-06-09: 20 mL
  Administered 2015-06-11 – 2015-06-13 (×2): 10 mL
  Filled 2015-05-29 (×5): qty 40

## 2015-05-29 NOTE — Progress Notes (Signed)
ANTICOAGULATION CONSULT NOTE - Follow Up Consult  Pharmacy Consult for Heparin Indication: atrial fibrillation  Allergies  Allergen Reactions  . Anti-Inflammatory Enzyme [Nutritional Supplements] Other (See Comments)    Retains fluids and headaches  . Iodinated Diagnostic Agents Hives    HIVES 15MIN S/P IV CONTRAST INJECTION,WILL NEED 13 HR PREP FOR FUTURE INJECTIONS, ok s/p 50mg  po benadryl//a.calhoun  . Spironolactone Other (See Comments)    Hair loss  . Arthrotec [Diclofenac-Misoprostol] Other (See Comments)    unknown  . Biaxin [Clarithromycin] Other (See Comments)    unknown  . Plavix [Clopidogrel Bisulfate] Other (See Comments)    unknown    Patient Measurements: Height: 5\' 5"  (165.1 cm) (05/25/15 @ 1352) Weight: 152 lb (68.947 kg) IBW/kg (Calculated) : 57 Heparin Dosing Weight: 56.5 kg   Vital Signs: Temp: 98.3 F (36.8 C) (05/03 2033) Temp Source: Oral (05/03 2033) BP: 111/101 mmHg (05/03 2033) Pulse Rate: 61 (05/03 2033)  Labs:  Recent Labs  05/27/15 0530  05/27/15 0706 05/28/15 0954 05/29/15 0430 05/29/15 2030  HGB  --   < > 9.7* 9.8* 9.4*  --   HCT  --   --  30.5* 30.5* 29.7*  --   PLT  --   --  432* 398 393  --   LABPROT 21.5*  --   --  18.8* 20.1*  --   INR 1.87*  --   --  1.57* 1.72*  --   HEPARINUNFRC  --   --   --   --   --  <0.10*  CREATININE 2.04*  --   --  1.72* 1.33*  --   < > = values in this interval not displayed.  Estimated Creatinine Clearance: 27.4 mL/min (by C-G formula based on Cr of 1.33).   Medications:  Infusions:  . Marland KitchenTPN (CLINIMIX-E) Adult 70 mL/hr at 05/29/15 1712   And  . fat emulsion 240 mL (05/29/15 1712)  . sodium chloride 55 mL/hr at 05/29/15 1800  . heparin 800 Units/hr (05/29/15 1653)    Assessment: 82 year old female on IV heparin for atrial fibrillation (Coumadin on hold) - initial level is undetectable but drawn early at 4 hours. INR 1.72.   Goal of Therapy:  Heparin level 0.3-0.7 units/ml Monitor platelets  by anticoagulation protocol: Yes   Plan:  Increase heparin to 1000 units/hr and recheck in 8 hours. - Ok to do with AM levels.  Monitor for signs and symptoms of bleeding.   Sloan Leiter, [name], BCPS Clinical Pharmacist [phone] 05/29/2015,9:24 PM

## 2015-05-29 NOTE — Progress Notes (Signed)
INFECTIOUS DISEASE PROGRESS NOTE  ID: [name] is a 82 y.o. female with  Principal Problem:   Diverticulitis of intestine with perforation and abscess Active Problems:   Chronic anticoagulation - Coumadin, CHADS2VASC=7   HTN (hypertension)   Pacemaker   Diabetes ([place])   History of CVA (cerebrovascular accident)   Tachycardia-bradycardia syndrome with symptomatic bradycardia   Chronic atrial fibrillation (HCC)   Diverticulitis   Supratherapeutic INR   CKD (chronic kidney disease) stage 3, GFR 30-59 ml/min   HOH (hard of hearing)   Protein-calorie malnutrition, moderate (HCC)   Diverticulitis of large intestine with perforation and abscess without bleeding  Subjective: 5 BM over last 12h. Still liquid.   Abtx:  Anti-infectives    Start     Dose/Rate Route Frequency Ordered Stop   05/27/15 1200  piperacillin-tazobactam (ZOSYN) IVPB 2.25 g     2.25 g 100 mL/hr over 30 Minutes Intravenous Every 6 hours 05/27/15 0832     05/27/15 1200  vancomycin (VANCOCIN) 50 mg/mL oral solution 125 mg     125 mg Oral Every 6 hours 05/27/15 0957     05/27/15 1000  metroNIDAZOLE (FLAGYL) IVPB 500 mg  Status:  Discontinued     500 mg 100 mL/hr over 60 Minutes Intravenous Every 8 hours 05/27/15 0957 05/28/15 1617   05/27/15 0600  cefoTEtan (CEFOTAN) 2 g in dextrose 5 % 50 mL IVPB  Status:  Discontinued     2 g 100 mL/hr over 30 Minutes Intravenous To [facility] 05/26/15 0832 05/27/15 1051   05/26/15 1300  metroNIDAZOLE (FLAGYL) tablet 500 mg  Status:  Discontinued     500 mg Oral 3 times per day on Sun 05/26/15 0928 05/26/15 0931   05/26/15 1300  neomycin (MYCIFRADIN) tablet 1,000 mg     1,000 mg Oral 3 times per day on Sun 05/26/15 0930 05/26/15 2139   05/26/15 1300  metroNIDAZOLE (FLAGYL) tablet 1,000 mg     1,000 mg Oral 3 times per day on Sun 05/26/15 0931 05/26/15 2139   05/26/15 0832  metroNIDAZOLE (FLAGYL) tablet 500 mg  Status:  Discontinued    Comments:  Take 2 pills  (=1000mg ) by mouth at 1pm, 3pm, and 10pm the day before your colorectal operation   500 mg Oral As directed 05/26/15 0832 05/26/15 0925   05/26/15 0832  neomycin (MYCIFRADIN) tablet 500 mg  Status:  Discontinued     500 mg Oral As directed 05/26/15 [id number] 05/26/15 0930   05/26/15 0815  fluconazole (DIFLUCAN) IVPB 200 mg     200 mg 100 mL/hr over 60 Minutes Intravenous Every 24 hours 05/26/15 0811     05/26/15 0600  piperacillin-tazobactam (ZOSYN) IVPB 3.375 g  Status:  Discontinued     3.375 g 12.5 mL/hr over 240 Minutes Intravenous Every 8 hours 05/25/15 2155 05/27/15 0832   05/25/15 1900  piperacillin-tazobactam (ZOSYN) IVPB 3.375 g     3.375 g 100 mL/hr over 30 Minutes Intravenous  Once 05/25/15 1851 05/25/15 2002      Medications:  Scheduled: . fluconazole (DIFLUCAN) IV  200 mg Intravenous Q24H  . heparin subcutaneous  5,000 Units Subcutaneous Q8H  . insulin aspart  0-9 Units Subcutaneous TID WC  . lactated ringers  1,000 mL Intravenous Once  . lip balm   Topical BID  . piperacillin-tazobactam (ZOSYN)  IV  2.25 g Intravenous Q6H  . potassium chloride  30 mEq Oral BID  . sodium chloride flush  10-40 mL Intracatheter Q12H  .  sodium phosphate  Dextrose 5% IVPB  30 mmol Intravenous Once  . vancomycin  125 mg Oral Q6H  . verapamil  120 mg Oral Daily    Objective: Vital signs in last 24 hours: Temp:  [97.9 F (36.6 C)-98.9 F (37.2 C)] 98.3 F (36.8 C) (05/03 0754) Pulse Rate:  [60-78] 65 (05/03 0455) Resp:  [20-31] 23 (05/03 0754) BP: (96-113)/(56-83) 111/56 mmHg (05/03 0754) SpO2:  [96 %-100 %] 100 % (05/03 0455) Weight:  [68.947 kg (152 lb)] 68.947 kg (152 lb) (05/03 0455)   General appearance: alert, cooperative and no distress Resp: clear to auscultation bilaterally Cardio: regular rate and rhythm GI: normal BS  Lab Results  Recent Labs  05/28/15 0954 05/29/15 0430  WBC 17.2* 13.1*  HGB 9.8* 9.4*  HCT 30.5* 29.7*  NA 130* 128*  K 4.5 4.6  CL 91* 94*  CO2  29 26  BUN 37* 28*  CREATININE 1.72* 1.33*   Liver Panel  Recent Labs  05/28/15 0954  PROT 5.5*  ALBUMIN 2.1*  AST 17  ALT 15  ALKPHOS 82  BILITOT 0.8   Sedimentation Rate No results for input(s): ESRSEDRATE in the last 72 hours. C-Reactive Protein No results for input(s): CRP in the last 72 hours.  Microbiology: Recent Results (from the past 240 hour(s))  C difficile quick scan w PCR reflex     Status: Abnormal   Collection Time: 05/26/15 11:21 AM  Result Value Ref Range Status   C Diff antigen NEGATIVE NEGATIVE Final   C Diff toxin POSITIVE (A) NEGATIVE Final    Comment: CDIFF PCR WAS RAN AND CONFIRMED POSITIVE CRITICAL RESULT CALLED TO, READ BACK BY AND VERIFIED WITH: R.GREENAWALT,RN 05/26/15 @1729     C Diff interpretation Results are indeterminate. See PCR results.  Final  Clostridium Difficile by PCR     Status: Abnormal   Collection Time: 05/26/15 11:21 AM  Result Value Ref Range Status   Toxigenic C Difficile by pcr POSITIVE (A) NEGATIVE Final    Comment: CRITICAL RESULT CALLED TO, READ BACK BY AND VERIFIED WITH: R.GREENAWALT,RN 05/26/15 @1730  BY V.WILKINS   MRSA PCR Screening     Status: None   Collection Time: 05/27/15  7:06 PM  Result Value Ref Range Status   MRSA by PCR NEGATIVE NEGATIVE Final    Comment:        The GeneXpert MRSA Assay (FDA approved for NASAL specimens only), is one component of a comprehensive MRSA colonization surveillance program. It is not intended to diagnose MRSA infection nor to guide or monitor treatment for MRSA infections.     Studies/Results: Dg Chest Port 1 View  05/28/2015  CLINICAL DATA:  PICC line placement EXAM: PORTABLE CHEST 1 VIEW COMPARISON:  05/01/2015 FINDINGS: Cardiomegaly again noted. Three leads cardiac pacemaker is unchanged in position. No acute infiltrate or pulmonary edema. There is left arm PICC line with tip in SVC right atrium junction. No pneumothorax. IMPRESSION: Cardiomegaly. No active disease.  Left arm PICC line in place. No pneumothorax. Electronically Signed   By: Lahoma Crocker M.D.   On: 05/28/2015 10:22     Assessment/Plan: Diverticulitis with abscess, perforation C diff ARF, CRI  Total days of antibiotics: 4 vanco IV/PO, zosyn,  Diflucan  She looks well despite her abscess.  Appreciate surgical f/u.  Cr improved, WBC better.  Will continue to watch.          [name] Infectious Diseases (pager) [phone] [email or web address] 05/29/2015, 9:19 AM  LOS: 4 days

## 2015-05-29 NOTE — Progress Notes (Signed)
Family Medicine Teaching Service Daily Progress Note Intern Pager: [phone]  Patient name: [name] Medical record number: [id number] Date of birth: [birth year] Age: 82 y.o. Gender: female  Primary Care Provider: [name], MD Consultants: Surgery Code Status: Full  Pt Overview and Major Events to Date:  4/28: Readmit for Diverticulitis with possible perforation / abscess  Assessment and Plan: [name] is a 82 y.o. female presenting with LLQ abdominal pain. PMH is significant for atrial fibrillation, hx of CVA, hypertension, type 2 diabetes, tachycardia-bradycardia syndrome s/p pacemaker, prior pericardial effusion s/p pericardial window 12/16, sleep apnea (no CPAP), stroke (right frontal 2013), GI bleeding with hemorrhoids, and arthritis and recurrent diverticulitis.  Recurrent diverticulitis with possible perforation / abscess:  CT showed: diverticulitis involving the sigmoid colon with previously noted extra luminal collection of gas is again noted and is increased in size from previous exam, worrisome for abscess. Additionally, there are several additional suspicious collections of fluid and gas which may represent abscesses. WBC 27.5 > 24.7> 17.2>13.1  Patient with positive C. Diff  - Surgery awaiting C. Diff treatment before colectomy, waiting for clinical improvement before surgery  - Antibiotics:  4/29 Zosyn >> ; 4/30 Fluc >> - Pain control: Morphine 1-2 mg q3hrs prn - Continue TPN (PICC line being placed this AM with TPN)   C.difficule positive : No symptomatic changes, stable.  - Consulted ID per surgery request, d/c'ed metronidazole  - Continue PO vancomycin 150 mg QID (5/1 PO vancomycin>>) - Will watch for improvement of bowel movement frequency over the next several days, expect TPN to cause some diarrhea as well.  - Contact precautions   Atrial fibrillation: INR supra-therapeutic on 4/26. INR  1.57>1.72 - Continue Verapamil and Digoxin  - D/C warfarin,  started heparin drip  - INR needs to be less than 1.5 post surgery   DM2: Diet contorlled. A1c 6.2 (12/2014)> 6.3  - monitor cbgs; SSI sensitive   Hyponatremia: Na 128>130> 128 - Increase sodium in TPN, and going down of NS   Oral Thrush - Continue Fluconazole   Anemia: Hgb 10.4 on admit (baseline 11). History of diverticulitis and hemorrhoids -  Hgb 9.7> 9.8 > 9.4   Resolved Acute Injury, CKD stage 3: Baseline Cr 1-1.2.  Cr 2.04  > 1.72>1.33   FEN/GI: Clear Liquid diet; NS@ 125 ml, TPN  Prophylaxis: SCDs  Disposition: Step-down   Subjective:  Patient is unchanged from previous exam yesterday. Patient continues to have slight hyponatremia.   Objective: Temp:  [97.9 F (36.6 C)-98.9 F (37.2 C)] 98.2 F (36.8 C) (05/03 0455) Pulse Rate:  [59-78] 65 (05/03 0455) Resp:  [20-31] 29 (05/03 0455) BP: (96-113)/(56-83) 109/60 mmHg (05/03 0455) SpO2:  [92 %-100 %] 100 % (05/03 0455) Weight:  [152 lb (68.947 kg)] 152 lb (68.947 kg) (05/03 0455)  Physical Exam: General: NAD, lying in bed  Cardiovascular: regular rate and rhythm with 2/6 murmur Respiratory: CTAB; Normal effort, 2+ radial pulses  Abdomen: Soft, guarding, tenderness to palpation in LLQ Skin: no rash  Laboratory:  Recent Labs Lab 05/27/15 0706 05/28/15 0954 05/29/15 0430  WBC 24.7* 17.2* 13.1*  HGB 9.7* 9.8* 9.4*  HCT 30.5* 30.5* 29.7*  PLT 432* 398 393    Recent Labs Lab 05/25/15 1639  05/27/15 0530 05/28/15 0954 05/29/15 0430  NA 133*  < > 128* 130* 128*  K 3.0*  < > 4.8 4.5 4.6  CL 84*  < > 86* 91* 94*  CO2 35*  < >  30 29 26   BUN 37*  < > 45* 37* 28*  CREATININE 1.44*  < > 2.04* 1.72* 1.33*  CALCIUM 8.8*  < > 8.5* 8.3* 8.0*  PROT 6.4*  --   --  5.5*  --   BILITOT 0.8  --   --  0.8  --   ALKPHOS 109  --   --  82  --   ALT 21  --   --  15  --   AST 27  --   --  17  --   GLUCOSE 111*  < > 147* 89 125*  < > = values in this interval not displayed.  Recent Labs Lab 05/25/15 2205  05/26/15 0551 05/27/15 0530 05/28/15 0954 05/29/15 0430  INR 3.01* 3.32* 1.87* 1.57* 1.72*   Imaging/Diagnostic Tests: Ct Abdomen Pelvis Wo Contrast  05/25/2015  IMPRESSION: 1. There are persistent inflammatory changes of diverticulitis involving the sigmoid colon. 2. Previously noted extra luminal collection of gas is again noted and is increased in size from previous exam, worrisome for abscess. Additionally, there are several additional suspicious collections of fluid and gas which may represent abscesses, but without enteric contrast material high cannot distinguish this from on opacified bowel loops. A repeat examination with IV, oral and rectal contrast may be helpful to assess for extent of left lower quadrant diverticular abscess formation. 3. Cardiac enlargement and pericardial effusion. 4. Aortic atherosclerosis. Electronically Signed   By: Kerby Moors M.D.   On: 05/25/2015 18:15   Skiler Tye Cletis Media, MD 05/29/2015, 7:00 AM PGY-3, [place] Intern pager: [phone], text pages welcome

## 2015-05-29 NOTE — Progress Notes (Signed)
[place] NOTE   Pharmacy Consult for tpn Indication: diverticulitis with perf  Allergies  Allergen Reactions  . Anti-Inflammatory Enzyme [Nutritional Supplements] Other (See Comments)    Retains fluids and headaches  . Iodinated Diagnostic Agents Hives    HIVES 15MIN S/P IV CONTRAST INJECTION,WILL NEED 13 HR PREP FOR FUTURE INJECTIONS, ok s/p 50mg  po benadryl//a.calhoun  . Spironolactone Other (See Comments)    Hair loss  . Arthrotec [Diclofenac-Misoprostol] Other (See Comments)    unknown  . Biaxin [Clarithromycin] Other (See Comments)    unknown  . Plavix [Clopidogrel Bisulfate] Other (See Comments)    unknown    Patient Measurements: Height: 5\' 5"  (165.1 cm) (05/25/15 @ 1352) Weight: 152 lb (68.947 kg) IBW/kg (Calculated) : 57 Adjusted Body Weight:  Usual Weight: 63 kg (dry weight)  Vital Signs: Temp: 98.3 F (36.8 C) (05/03 0754) Temp Source: Oral (05/03 0754) BP: 111/56 mmHg (05/03 0754) Pulse Rate: 65 (05/03 0455) Intake/Output from previous day: 05/02 0701 - 05/03 0700 In: 1766.1 [I.V.:1316.1; IV Piggyback:450] Out: -  Intake/Output from this shift:    Labs:  Recent Labs  05/27/15 0530 05/27/15 0706 05/28/15 0954 05/29/15 0430  WBC  --  24.7* 17.2* 13.1*  HGB  --  9.7* 9.8* 9.4*  HCT  --  30.5* 30.5* 29.7*  PLT  --  432* 398 393  INR 1.87*  --  1.57* 1.72*     Recent Labs  05/26/15 0843 05/27/15 0530 05/27/15 1451 05/28/15 0954 05/29/15 0430  NA  --  128*  --  130* 128*  K  --  4.8  --  4.5 4.6  CL  --  86*  --  91* 94*  CO2  --  30  --  29 26  GLUCOSE  --  147*  --  89 125*  BUN  --  45*  --  37* 28*  CREATININE  --  2.04*  --  1.72* 1.33*  CALCIUM  --  8.5*  --  8.3* 8.0*  MG  --   --   --  1.6* 2.1  PHOS  --   --   --  2.9 2.3*  PROT  --   --   --  5.5*  --   ALBUMIN  --   --   --  2.1*  --   AST  --   --   --  17  --   ALT  --   --   --  15  --   ALKPHOS  --   --   --  82  --   BILITOT  --   --   --  0.8  --    PREALBUMIN 6.6*  --  4.5*  --   --   TRIG  --   --   --  54  --    Estimated Creatinine Clearance: 27.4 mL/min (by C-G formula based on Cr of 1.33).    Recent Labs  05/28/15 1739 05/28/15 2152 05/29/15 0748  GLUCAP 109* 145* 105*    Medical History: Past Medical History  Diagnosis Date  . Pericardial effusion     a. s/p pericardial window 01/16/15  . History of GI bleed     a. secondary to AVM's. Treated with Fe infusion  . HTN (hypertension)   . Obesity   . LVH (left ventricular hypertrophy)   . Sleep apnea     mild-no cpap  . Positive TB test   . Anemia   .  Stroke [facility])     a. 2013: right frontal  . Arthritis   . Tachycardia-bradycardia syndrome [facility])     a. s/p Medtronic [place], model number [id number], serial number [id number] 12/2013  . History of shingles   . Chronic diastolic (congestive) heart failure ([place])     a. Echo 01/2015 with EF of 55-60%.  . Chronic atrial fibrillation (HCC)     a. on Coumadin, Digoxin, and Verapamil    Medications:  Scheduled:  . fluconazole (DIFLUCAN) IV  200 mg Intravenous Q24H  . heparin subcutaneous  5,000 Units Subcutaneous Q8H  . insulin aspart  0-9 Units Subcutaneous TID WC  . lactated ringers  1,000 mL Intravenous Once  . lip balm   Topical BID  . piperacillin-tazobactam (ZOSYN)  IV  2.25 g Intravenous Q6H  . potassium chloride  30 mEq Oral BID  . sodium chloride flush  10-40 mL Intracatheter Q12H  . vancomycin  125 mg Oral Q6H  . verapamil  120 mg Oral Daily    Insulin Requirements in the past 24 hours:  0 units sensitive SSI  Assessment: 82 y/o female with several recent admissions for diverticulitis admitted 05/25/2015 with diverticulitis/abscess with microperforation. She has previously been treated conservatively with antibiotics but plan is for colectomy once C diff infection is under control.   Surgeries/Procedures: 4/29 CT abd - persistent diverticulitis with possible abscess  GI: CT showed diverticulitis  involving sigmoid colon worrisome for abscess. Unsure if they can be drained. Plan for colectomy after C.diff treatment; albumin 2.4, prealbumin 4.5<<6.6, LBM 5/1.  Endo: A1c 5.2, CBGs 106-145 Lytes: Na 128, K 4.6, Mg 2.3, Phos 2.3, CoCa 9.18 Renal: SCr down to 1.33, CrCl ~30 ml/min, +1.7 L/ 24 hrs, NS @ 85 mL/hr Pulm: 100% on RA Cards: BP soft, HR 60s Hepatobil: LFTs wnl, Tbili 0.8 ID: D#4 of Zosyn, metronidazole, fluconazole for diverticulitis; C diff + on vancomycin PO; WBC 24.7>>17.2>>13.1 AC/heme: warfarin PTA for Afib, INR 1.72 - warfarin held; Hb 9.4, plt wnl  Best Practices: SCDs  TPN Access: PICC 5/2>> TPN start date: 5/2>>  Nutritional Goals (per RD recommendations):  Kcal: 1500-1700 Protein: 70-80 gm  Current Nutrition:  Clear liquids  Plan:  -Increase Clinimix E 5/15 to 70 mL/hr and IV lipids at 10 mL/hr. This will provide 1672 Kcal and 84 gm of protein which meets 100% of nutritional goals.   -Add MVI and TE to TPN  -Continue sensitive sliding scale -Sodium Phosphate 30 mmol x 1 dose    -Add NaCl to TPN bag to make it isotonic and decrease normal saline infusion rate to 55 mL/hr to accommodate increase in TPN rate and minimize volume overload. Discussed with MD. Monitor sodium levels  -F/U tpn labs in AM   Albertina Parr, [name]., BCPS Clinical Pharmacist Pager [phone]

## 2015-05-29 NOTE — Progress Notes (Signed)
Pt is to be started on Heparin gtt.  Informed Dr. Emmaline Life that IV team is unable place another IV for heparin gtt.  Informed Dr. Emmaline Life that patient has a double lumen PICC in left arm with TPN and multiple antibiotics. Asked provider if there were any alternative measures that she would like to take. Dr. Emmaline Life stated she will call back. Will continue to monitor.

## 2015-05-29 NOTE — Progress Notes (Signed)
IV Team able to place midline catheter in R upper arm so Heparin can be infused. Double lumen PICC in L upper arm to be used for TPN/Lipids and Fluids/antibiotics/blood draws.

## 2015-05-29 NOTE — Progress Notes (Signed)
ANTICOAGULATION & ANTIBIOTIC CONSULT NOTE - Initial Consult  Pharmacy Consult:  Heparin + Zosyn Indication: atrial fibrillation + Intra-abdominal infection  Allergies  Allergen Reactions  . Anti-Inflammatory Enzyme [Nutritional Supplements] Other (See Comments)    Retains fluids and headaches  . Iodinated Diagnostic Agents Hives    HIVES 15MIN S/P IV CONTRAST INJECTION,WILL NEED 13 HR PREP FOR FUTURE INJECTIONS, ok s/p 50mg  po benadryl//a.calhoun  . Spironolactone Other (See Comments)    Hair loss  . Arthrotec [Diclofenac-Misoprostol] Other (See Comments)    unknown  . Biaxin [Clarithromycin] Other (See Comments)    unknown  . Plavix [Clopidogrel Bisulfate] Other (See Comments)    unknown    Patient Measurements: Height: 5\' 5"  (165.1 cm) (05/25/15 @ 1352) Weight: 152 lb (68.947 kg) IBW/kg (Calculated) : 57 Heparin Dosing Weight: 67 kg  Vital Signs: Temp: 98.3 F (36.8 C) (05/03 0754) Temp Source: Oral (05/03 0754) BP: 111/56 mmHg (05/03 0754) Pulse Rate: 65 (05/03 0455)  Labs:  Recent Labs  05/27/15 0530  05/27/15 0706 05/28/15 0954 05/29/15 0430  HGB  --   < > 9.7* 9.8* 9.4*  HCT  --   --  30.5* 30.5* 29.7*  PLT  --   --  432* 398 393  LABPROT 21.5*  --   --  18.8* 20.1*  INR 1.87*  --   --  1.57* 1.72*  CREATININE 2.04*  --   --  1.72* 1.33*  < > = values in this interval not displayed.  Estimated Creatinine Clearance: 27.4 mL/min (by C-G formula based on Cr of 1.33).   Medical History: Past Medical History  Diagnosis Date  . Pericardial effusion     a. s/p pericardial window 01/16/15  . History of GI bleed     a. secondary to AVM's. Treated with Fe infusion  . HTN (hypertension)   . Obesity   . LVH (left ventricular hypertrophy)   . Sleep apnea     mild-no cpap  . Positive TB test   . Anemia   . Stroke [facility])     a. 2013: right frontal  . Arthritis   . Tachycardia-bradycardia syndrome [facility])     a. s/p Medtronic [place], model number [id number],  serial number [id number] 12/2013  . History of shingles   . Chronic diastolic (congestive) heart failure ([place])     a. Echo 01/2015 with EF of 55-60%.  . Chronic atrial fibrillation (HCC)     a. on Coumadin, Digoxin, and Verapamil      Assessment: 89 YOF with history of Afib on Coumadin PTA.  Patient to transition to IV heparin while awaiting surgery.  Noted she received heparin SQ around 0700 today and her INR is less than two.  No bleeding reported.   Pharmacy also managing Zosyn for intra-abdominal infection.  Patient's WBC and renal function are improving.  She remains afebrile.  4/29 Zosyn >> 4/30 Fluc >> 4/30 Flagyl >> 5/2  4/30 Cdiff PCR - positive 4/30 UA >> few bacteria, large leukocytes, no nitrities, WBC wnl 4/6 urine >> citrobacter/klebsiella   Goal of Therapy:  Heparin level 0.3-0.7 units/ml Monitor platelets by anticoagulation protocol: Yes  Resolution of infection    Plan:  - D/C heparin SQ - Start heparin gtt at 800 units/hr, no bolus d/t proximity of heparin SQ dose and INR of 1.72 - Change Zosyn to 3.375gm IV Q8H, 4 hr infusion - Fluconazole 200mg  IV Q24H and PO vanc 125mg  Q6H per MD - Monitor renal fxn, clinical progress,  abx LOT     Clark Cuff D. Mina Marble, [name], BCPS Pager:  [phone] 05/29/2015, 10:25 AM

## 2015-05-29 NOTE — Progress Notes (Signed)
Patient ID: [name], female   DOB: 06/11/1923, 82 y.o.   MRN: [id number]     [address] [place], [place] [address]    Phone: [phone] FAX: [phone]     Subjective: 5 bouts of diarrhea overnight. WBC down from 17.2k to 13.1k. Not much discomfort.   Objective:  Vital signs:  Filed Vitals:   05/28/15 2023 05/28/15 2348 05/29/15 0400 05/29/15 0455  BP: 109/59 101/59 96/83 109/60  Pulse: 78 62  65  Temp:  98.9 F (37.2 C)  98.2 F (36.8 C)  TempSrc:  Oral  Oral  Resp: '21 23 31 29  '$ Height:      Weight:    68.947 kg (152 lb)  SpO2: 99% 100%  100%    Last BM Date: 05/29/15  Intake/Output   Yesterday:  05/02 0701 - 05/03 0700 In: 1766.1 [I.V.:1316.1; IV Piggyback:450] Out: -  This shift:    I/O last 3 completed shifts: In: 3591.1 [I.V.:2691.1; IV PRFFMBWGY:659] Out: -        Physical Exam: General: Pt awake/alert/oriented x4 in no acute distress  Abdomen: Soft.  Nondistended.  ttp llq.   No evidence of peritonitis.  No incarcerated hernias.    Problem List:   Principal Problem:   Diverticulitis of intestine with perforation and abscess Active Problems:   Chronic anticoagulation - Coumadin, CHADS2VASC=7   HTN (hypertension)   Pacemaker   Diabetes ([place])   History of CVA (cerebrovascular accident)   Tachycardia-bradycardia syndrome with symptomatic bradycardia   Chronic atrial fibrillation (HCC)   Diverticulitis   Supratherapeutic INR   CKD (chronic kidney disease) stage 3, GFR 30-59 ml/min   HOH (hard of hearing)   Protein-calorie malnutrition, moderate ([place])   Diverticulitis of large intestine with perforation and abscess without bleeding    Results:   Labs: Results for orders placed or performed during the hospital encounter of 05/25/15 (from the past 48 hour(s))  Glucose, capillary     Status: Abnormal   Collection Time: 05/27/15  7:47 AM  Result Value Ref Range    Glucose-Capillary 148 (H) 65 - 99 mg/dL  Glucose, capillary     Status: Abnormal   Collection Time: 05/27/15 11:44 AM  Result Value Ref Range   Glucose-Capillary 115 (H) 65 - 99 mg/dL  Prealbumin     Status: Abnormal   Collection Time: 05/27/15  2:51 PM  Result Value Ref Range   Prealbumin 4.5 (L) 18 - 38 mg/dL  Glucose, capillary     Status: Abnormal   Collection Time: 05/27/15  5:19 PM  Result Value Ref Range   Glucose-Capillary 138 (H) 65 - 99 mg/dL  MRSA PCR Screening     Status: None   Collection Time: 05/27/15  7:06 PM  Result Value Ref Range   MRSA by PCR NEGATIVE NEGATIVE    Comment:        The GeneXpert MRSA Assay (FDA approved for NASAL specimens only), is one component of a comprehensive MRSA colonization surveillance program. It is not intended to diagnose MRSA infection nor to guide or monitor treatment for MRSA infections.   Glucose, capillary     Status: None   Collection Time: 05/27/15 10:10 PM  Result Value Ref Range   Glucose-Capillary 90 65 - 99 mg/dL  Glucose, capillary     Status: Abnormal   Collection Time: 05/28/15  8:28 AM  Result Value Ref Range   Glucose-Capillary  112 (H) 65 - 99 mg/dL  Protime-INR     Status: Abnormal   Collection Time: 05/28/15  9:54 AM  Result Value Ref Range   Prothrombin Time 18.8 (H) 11.6 - 15.2 seconds   INR 1.57 (H) 0.00 - 1.49  CBC     Status: Abnormal   Collection Time: 05/28/15  9:54 AM  Result Value Ref Range   WBC 17.2 (H) 4.0 - 10.5 K/uL   RBC 3.72 (L) 3.87 - 5.11 MIL/uL   Hemoglobin 9.8 (L) 12.0 - 15.0 g/dL   HCT 30.5 (L) 36.0 - 46.0 %   MCV 82.0 78.0 - 100.0 fL   MCH 26.3 26.0 - 34.0 pg   MCHC 32.1 30.0 - 36.0 g/dL   RDW 15.0 11.5 - 15.5 %   Platelets 398 150 - 400 K/uL  Comprehensive metabolic panel     Status: Abnormal   Collection Time: 05/28/15  9:54 AM  Result Value Ref Range   Sodium 130 (L) 135 - 145 mmol/L   Potassium 4.5 3.5 - 5.1 mmol/L   Chloride 91 (L) 101 - 111 mmol/L   CO2 29 22 - 32  mmol/L   Glucose, Bld 89 65 - 99 mg/dL   BUN 37 (H) 6 - 20 mg/dL   Creatinine, Ser 1.72 (H) 0.44 - 1.00 mg/dL   Calcium 8.3 (L) 8.9 - 10.3 mg/dL   Total Protein 5.5 (L) 6.5 - 8.1 g/dL   Albumin 2.1 (L) 3.5 - 5.0 g/dL   AST 17 15 - 41 U/L   ALT 15 14 - 54 U/L   Alkaline Phosphatase 82 38 - 126 U/L   Total Bilirubin 0.8 0.3 - 1.2 mg/dL   GFR calc non Af Amer 25 (L) >60 mL/min   GFR calc Af Amer 29 (L) >60 mL/min    Comment: (NOTE) The eGFR has been calculated using the CKD EPI equation. This calculation has not been validated in all clinical situations. eGFR's persistently <60 mL/min signify possible Chronic Kidney Disease.    Anion gap 10 5 - 15  Magnesium     Status: Abnormal   Collection Time: 05/28/15  9:54 AM  Result Value Ref Range   Magnesium 1.6 (L) 1.7 - 2.4 mg/dL  Phosphorus     Status: None   Collection Time: 05/28/15  9:54 AM  Result Value Ref Range   Phosphorus 2.9 2.5 - 4.6 mg/dL  Triglycerides     Status: None   Collection Time: 05/28/15  9:54 AM  Result Value Ref Range   Triglycerides 54 <150 mg/dL  Differential     Status: Abnormal   Collection Time: 05/28/15  9:54 AM  Result Value Ref Range   Neutrophils Relative % 88 %   Neutro Abs 15.3 (H) 1.7 - 7.7 K/uL   Lymphocytes Relative 6 %   Lymphs Abs 0.9 0.7 - 4.0 K/uL   Monocytes Relative 5 %   Monocytes Absolute 0.9 0.1 - 1.0 K/uL   Eosinophils Relative 1 %   Eosinophils Absolute 0.1 0.0 - 0.7 K/uL   Basophils Relative 0 %   Basophils Absolute 0.0 0.0 - 0.1 K/uL  Glucose, capillary     Status: Abnormal   Collection Time: 05/28/15 12:56 PM  Result Value Ref Range   Glucose-Capillary 106 (H) 65 - 99 mg/dL  Glucose, capillary     Status: Abnormal   Collection Time: 05/28/15  5:39 PM  Result Value Ref Range   Glucose-Capillary 109 (H) 65 - 99 mg/dL  Glucose, capillary     Status: Abnormal   Collection Time: 05/28/15  9:52 PM  Result Value Ref Range   Glucose-Capillary 145 (H) 65 - 99 mg/dL   Protime-INR     Status: Abnormal   Collection Time: 05/29/15  4:30 AM  Result Value Ref Range   Prothrombin Time 20.1 (H) 11.6 - 15.2 seconds   INR 1.72 (H) 0.00 - 1.49  CBC     Status: Abnormal   Collection Time: 05/29/15  4:30 AM  Result Value Ref Range   WBC 13.1 (H) 4.0 - 10.5 K/uL   RBC 3.69 (L) 3.87 - 5.11 MIL/uL   Hemoglobin 9.4 (L) 12.0 - 15.0 g/dL   HCT 29.7 (L) 36.0 - 46.0 %   MCV 80.5 78.0 - 100.0 fL   MCH 25.5 (L) 26.0 - 34.0 pg   MCHC 31.6 30.0 - 36.0 g/dL   RDW 15.1 11.5 - 15.5 %   Platelets 393 150 - 400 K/uL  Basic metabolic panel     Status: Abnormal   Collection Time: 05/29/15  4:30 AM  Result Value Ref Range   Sodium 128 (L) 135 - 145 mmol/L   Potassium 4.6 3.5 - 5.1 mmol/L   Chloride 94 (L) 101 - 111 mmol/L   CO2 26 22 - 32 mmol/L   Glucose, Bld 125 (H) 65 - 99 mg/dL   BUN 28 (H) 6 - 20 mg/dL   Creatinine, Ser 1.33 (H) 0.44 - 1.00 mg/dL   Calcium 8.0 (L) 8.9 - 10.3 mg/dL   GFR calc non Af Amer 34 (L) >60 mL/min   GFR calc Af Amer 39 (L) >60 mL/min    Comment: (NOTE) The eGFR has been calculated using the CKD EPI equation. This calculation has not been validated in all clinical situations. eGFR's persistently <60 mL/min signify possible Chronic Kidney Disease.    Anion gap 8 5 - 15  Phosphorus     Status: Abnormal   Collection Time: 05/29/15  4:30 AM  Result Value Ref Range   Phosphorus 2.3 (L) 2.5 - 4.6 mg/dL  Magnesium     Status: None   Collection Time: 05/29/15  4:30 AM  Result Value Ref Range   Magnesium 2.1 1.7 - 2.4 mg/dL    Imaging / Studies: Dg Chest Port 1 View  05/28/2015  CLINICAL DATA:  PICC line placement EXAM: PORTABLE CHEST 1 VIEW COMPARISON:  05/01/2015 FINDINGS: Cardiomegaly again noted. Three leads cardiac pacemaker is unchanged in position. No acute infiltrate or pulmonary edema. There is left arm PICC line with tip in SVC right atrium junction. No pneumothorax. IMPRESSION: Cardiomegaly. No active disease. Left arm PICC line in  place. No pneumothorax. Electronically Signed   By: Lahoma Crocker M.D.   On: 05/28/2015 10:22    Medications / Allergies:  Scheduled Meds: . fluconazole (DIFLUCAN) IV  200 mg Intravenous Q24H  . heparin subcutaneous  5,000 Units Subcutaneous Q8H  . insulin aspart  0-9 Units Subcutaneous TID WC  . lactated ringers  1,000 mL Intravenous Once  . lip balm   Topical BID  . piperacillin-tazobactam (ZOSYN)  IV  2.25 g Intravenous Q6H  . potassium chloride  30 mEq Oral BID  . sodium chloride flush  10-40 mL Intracatheter Q12H  . vancomycin  125 mg Oral Q6H  . verapamil  120 mg Oral Daily   Continuous Infusions: . sodium chloride 85 mL/hr at 05/28/15 2214  . Marland KitchenTPN (CLINIMIX-E) Adult 40 mL/hr at 05/28/15 1814   And  .  fat emulsion 240 mL (05/28/15 1814)   PRN Meds:.acetaminophen, alum & mag hydroxide-simeth, magic mouthwash, menthol-cetylpyridinium, metoprolol, morphine injection, ondansetron **OR** ondansetron (ZOFRAN) IV, sodium chloride flush  Antibiotics: Anti-infectives    Start     Dose/Rate Route Frequency Ordered Stop   05/27/15 1200  piperacillin-tazobactam (ZOSYN) IVPB 2.25 g     2.25 g 100 mL/hr over 30 Minutes Intravenous Every 6 hours 05/27/15 0832     05/27/15 1200  vancomycin (VANCOCIN) 50 mg/mL oral solution 125 mg     125 mg Oral Every 6 hours 05/27/15 0957     05/27/15 1000  metroNIDAZOLE (FLAGYL) IVPB 500 mg  Status:  Discontinued     500 mg 100 mL/hr over 60 Minutes Intravenous Every 8 hours 05/27/15 0957 05/28/15 1617   05/27/15 0600  cefoTEtan (CEFOTAN) 2 g in dextrose 5 % 50 mL IVPB  Status:  Discontinued     2 g 100 mL/hr over 30 Minutes Intravenous To [facility] 05/26/15 0832 05/27/15 1051   05/26/15 1300  metroNIDAZOLE (FLAGYL) tablet 500 mg  Status:  Discontinued     500 mg Oral 3 times per day on Sun 05/26/15 0928 05/26/15 0931   05/26/15 1300  neomycin (MYCIFRADIN) tablet 1,000 mg     1,000 mg Oral 3 times per day on Sun 05/26/15 0930 05/26/15 2139    05/26/15 1300  metroNIDAZOLE (FLAGYL) tablet 1,000 mg     1,000 mg Oral 3 times per day on Sun 05/26/15 0931 05/26/15 2139   05/26/15 0832  metroNIDAZOLE (FLAGYL) tablet 500 mg  Status:  Discontinued    Comments:  Take 2 pills (='1000mg'$ ) by mouth at 1pm, 3pm, and 10pm the day before your colorectal operation   500 mg Oral As directed 05/26/15 0832 05/26/15 0925   05/26/15 0832  neomycin (MYCIFRADIN) tablet 500 mg  Status:  Discontinued     500 mg Oral As directed 05/26/15 8182 05/26/15 0930   05/26/15 0815  fluconazole (DIFLUCAN) IVPB 200 mg     200 mg 100 mL/hr over 60 Minutes Intravenous Every 24 hours 05/26/15 0811     05/26/15 0600  piperacillin-tazobactam (ZOSYN) IVPB 3.375 g  Status:  Discontinued     3.375 g 12.5 mL/hr over 240 Minutes Intravenous Every 8 hours 05/25/15 2155 05/27/15 0832   05/25/15 1900  piperacillin-tazobactam (ZOSYN) IVPB 3.375 g     3.375 g 100 mL/hr over 30 Minutes Intravenous  Once 05/25/15 1851 05/25/15 2002        Assessment/Plan Diverticulitis with perforation c diff colitis 3rd admission since February.  Questionable neoplasm on previous imaging.  Last colonoscopy 4-5 years ago. Doubtful abscesses are amendable to drainage by CT scan.   Continue bowel rest, TPN, will discuss surgical intervention once c diff improves. ID-zosyn and vanc, fluconazole.  ID following FEN-clears Afib-continue to hold coumadin Dispo-cdiff/tics  [place] Pian, [facility] Surgery Pager [phone]) For consults and floor pages call [phone](7A-4:30P)  05/29/2015 7:49 AM

## 2015-05-29 NOTE — Progress Notes (Signed)
Able to start PIV power glide in Rt arm for heparin.  This will allow PICC to be used for lab heparin levels to be drawn.

## 2015-05-30 ENCOUNTER — Inpatient Hospital Stay (HOSPITAL_COMMUNITY): Payer: Medicare Other

## 2015-05-30 DIAGNOSIS — K5780 Diverticulitis of intestine, part unspecified, with perforation and abscess without bleeding: Secondary | ICD-10-CM

## 2015-05-30 DIAGNOSIS — E43 Unspecified severe protein-calorie malnutrition: Secondary | ICD-10-CM

## 2015-05-30 LAB — CBC
HCT: 32.2 % — ABNORMAL LOW (ref 36.0–46.0)
HEMATOCRIT: 30.5 % — AB (ref 36.0–46.0)
Hemoglobin: 9.4 g/dL — ABNORMAL LOW (ref 12.0–15.0)
Hemoglobin: 10.3 g/dL — ABNORMAL LOW (ref 12.0–15.0)
MCH: 25.8 pg — ABNORMAL LOW (ref 26.0–34.0)
MCH: 26.3 pg (ref 26.0–34.0)
MCHC: 30.8 g/dL (ref 30.0–36.0)
MCHC: 32 g/dL (ref 30.0–36.0)
MCV: 83.6 fL (ref 78.0–100.0)
MCV: 82.4 fL (ref 78.0–100.0)
PLATELETS: 382 10*3/uL (ref 150–400)
Platelets: 368 10*3/uL (ref 150–400)
RBC: 3.65 MIL/uL — ABNORMAL LOW (ref 3.87–5.11)
RBC: 3.91 MIL/uL (ref 3.87–5.11)
RDW: 15.5 % (ref 11.5–15.5)
RDW: 15.4 % (ref 11.5–15.5)
WBC: 10.2 10*3/uL (ref 4.0–10.5)
WBC: 11.1 10*3/uL — ABNORMAL HIGH (ref 4.0–10.5)

## 2015-05-30 LAB — COMPREHENSIVE METABOLIC PANEL
ALT: 12 U/L — ABNORMAL LOW (ref 14–54)
ANION GAP: 10 (ref 5–15)
AST: 21 U/L (ref 15–41)
Albumin: 2.1 g/dL — ABNORMAL LOW (ref 3.5–5.0)
Alkaline Phosphatase: 94 U/L (ref 38–126)
BUN: 23 mg/dL — ABNORMAL HIGH (ref 6–20)
CALCIUM: 8.2 mg/dL — AB (ref 8.9–10.3)
CO2: 22 mmol/L (ref 22–32)
CREATININE: 1.05 mg/dL — AB (ref 0.44–1.00)
Chloride: 100 mmol/L — ABNORMAL LOW (ref 101–111)
GFR, EST AFRICAN AMERICAN: 53 mL/min — AB (ref >60)
GFR, EST NON AFRICAN AMERICAN: 45 mL/min — AB (ref >60)
GLUCOSE: 127 mg/dL — AB (ref 65–99)
POTASSIUM: 5.3 mmol/L — AB (ref 3.5–5.1)
Sodium: 132 mmol/L — ABNORMAL LOW (ref 135–145)
Total Bilirubin: 0.5 mg/dL (ref 0.3–1.2)
Total Protein: 5.8 g/dL — ABNORMAL LOW (ref 6.5–8.1)

## 2015-05-30 LAB — GLUCOSE, CAPILLARY
GLUCOSE-CAPILLARY: 142 mg/dL — AB (ref 65–99)
GLUCOSE-CAPILLARY: 141 mg/dL — AB (ref 65–99)
Glucose-Capillary: 142 mg/dL — ABNORMAL HIGH (ref 65–99)
Glucose-Capillary: 128 mg/dL — ABNORMAL HIGH (ref 65–99)

## 2015-05-30 LAB — PROTIME-INR
INR: 1.54 — ABNORMAL HIGH (ref 0.00–1.49)
Prothrombin Time: 18.6 seconds — ABNORMAL HIGH (ref 11.6–15.2)

## 2015-05-30 LAB — HEPARIN LEVEL (UNFRACTIONATED)
Heparin Unfractionated: <0.1 IU/mL — ABNORMAL LOW (ref 0.30–0.70)
Heparin Unfractionated: 0.27 IU/mL — ABNORMAL LOW (ref 0.30–0.70)
Heparin Unfractionated: 0.32 IU/mL (ref 0.30–0.70)

## 2015-05-30 LAB — PHOSPHORUS: Phosphorus: 2.8 mg/dL (ref 2.5–4.6)

## 2015-05-30 LAB — MAGNESIUM: Magnesium: 1.9 mg/dL (ref 1.7–2.4)

## 2015-05-30 MED ORDER — BENZOCAINE-MENTHOL 20-0.5 % EX AERO
1.0000 "application " | INHALATION_SPRAY | Freq: Four times a day (QID) | CUTANEOUS | Status: DC | PRN
  Filled 2015-05-30 (×2): qty 56

## 2015-05-30 MED ORDER — TORSEMIDE 20 MG PO TABS: 20.0000 mg | ORAL_TABLET | Freq: Every day | ORAL | Status: DC

## 2015-05-30 MED ORDER — FAT EMULSION 20 % IV EMUL
240.0000 mL | INTRAVENOUS | Status: AC
  Administered 2015-05-30: 240 mL via INTRAVENOUS
  Filled 2015-05-30: qty 250

## 2015-05-30 MED ORDER — BARIUM SULFATE 2.1 % PO SUSP
ORAL | Status: AC
  Administered 2015-05-30: 1 mL
  Filled 2015-05-30: qty 2

## 2015-05-30 MED ORDER — DIATRIZOATE MEGLUMINE & SODIUM 66-10 % PO SOLN
ORAL | Status: AC
  Filled 2015-05-30: qty 30

## 2015-05-30 MED ORDER — TORSEMIDE 20 MG PO TABS
20.0000 mg | ORAL_TABLET | Freq: Once | ORAL | Status: AC
  Administered 2015-05-30: 20 mg via ORAL
  Filled 2015-05-30: qty 1

## 2015-05-30 MED ORDER — TRACE MINERALS CR-CU-MN-SE-ZN 10-1000-500-60 MCG/ML IV SOLN
INTRAVENOUS | Status: AC
  Administered 2015-05-30: 17:00:00 via INTRAVENOUS
  Filled 2015-05-30: qty 1680

## 2015-05-30 MED ORDER — SILVER SULFADIAZINE 1 % EX CREA
TOPICAL_CREAM | CUTANEOUS | Status: DC | PRN
  Filled 2015-05-30 (×2): qty 85

## 2015-05-30 NOTE — Progress Notes (Signed)
ANTICOAGULATION CONSULT NOTE - Follow Up Consult  Pharmacy Consult for Heparin Indication: atrial fibrillation  Allergies  Allergen Reactions  . Anti-Inflammatory Enzyme [Nutritional Supplements] Other (See Comments)    Retains fluids and headaches  . Iodinated Diagnostic Agents Hives    HIVES 15MIN S/P IV CONTRAST INJECTION,WILL NEED 13 HR PREP FOR FUTURE INJECTIONS, ok s/p 50mg  po benadryl//a.calhoun  . Spironolactone Other (See Comments)    Hair loss  . Arthrotec [Diclofenac-Misoprostol] Other (See Comments)    unknown  . Biaxin [Clarithromycin] Other (See Comments)    unknown  . Plavix [Clopidogrel Bisulfate] Other (See Comments)    unknown    Patient Measurements: Height: 5\' 5"  (165.1 cm) (05/25/15 @ 1352) Weight: 152 lb (68.947 kg) IBW/kg (Calculated) : 57 Heparin Dosing Weight: 68 kg  Vital Signs: Temp: 98.2 F (36.8 C) (05/04 2048) Temp Source: Oral (05/04 2048) BP: 110/59 mmHg (05/04 2048) Pulse Rate: 62 (05/04 2048)  Labs:  Recent Labs  05/28/15 0954 05/29/15 0430  05/30/15 0517 05/30/15 0518 05/30/15 1000 05/30/15 1339 05/30/15 2025  HGB 9.8* 9.4*  --  9.4*  --  10.3*  --   --   HCT 30.5* 29.7*  --  30.5*  --  32.2*  --   --   PLT 398 393  --  368  --  382  --   --   LABPROT 18.8* 20.1*  --  18.6*  --   --   --   --   INR 1.57* 1.72*  --  1.54*  --   --   --   --   HEPARINUNFRC  --   --   < >  --  <0.10*  --  0.27* 0.32  CREATININE 1.72* 1.33*  --   --   --  1.05*  --   --   < > = values in this interval not displayed.  Estimated Creatinine Clearance: 34.7 mL/min (by C-G formula based on Cr of 1.05).   Medications:  Scheduled:  . fluconazole (DIFLUCAN) IV  200 mg Intravenous Q24H  . insulin aspart  0-9 Units Subcutaneous TID WC  . lip balm   Topical BID  . piperacillin-tazobactam (ZOSYN)  IV  3.375 g Intravenous Q8H  . sodium chloride flush  10-40 mL Intracatheter Q12H  . vancomycin  125 mg Oral Q6H  . verapamil  120 mg Oral Daily     Assessment: 82 yo female on heparin for AFib.  Heparin level now therapeutic on 1350 units/hr.  Noted plans to stop heparin in the AM in anticipation of surgery.  Goal of Therapy:  Heparin level 0.3-0.7 units/ml Monitor platelets by anticoagulation protocol: Yes   Plan:  Continue heparin at 1350 units/hr Daily heparin level and CBC Watch for s/s of bleeding Follow-up plans for resuming AC after OR tomorrow.  [employer], Pharm.D., BCPS Clinical Pharmacist Pager [phone] 05/30/2015 9:13 PM

## 2015-05-30 NOTE — Progress Notes (Signed)
ANTICOAGULATION CONSULT NOTE - Follow Up Consult  Pharmacy Consult for Heparin Indication: atrial fibrillation  Allergies  Allergen Reactions  . Anti-Inflammatory Enzyme [Nutritional Supplements] Other (See Comments)    Retains fluids and headaches  . Iodinated Diagnostic Agents Hives    HIVES 15MIN S/P IV CONTRAST INJECTION,WILL NEED 13 HR PREP FOR FUTURE INJECTIONS, ok s/p 50mg  po benadryl//a.calhoun  . Spironolactone Other (See Comments)    Hair loss  . Arthrotec [Diclofenac-Misoprostol] Other (See Comments)    unknown  . Biaxin [Clarithromycin] Other (See Comments)    unknown  . Plavix [Clopidogrel Bisulfate] Other (See Comments)    unknown    Patient Measurements: Height: 5\' 5"  (165.1 cm) (05/25/15 @ 1352) Weight: 152 lb (68.947 kg) IBW/kg (Calculated) : 57 Heparin Dosing Weight:   Vital Signs: Temp: 98.4 F (36.9 C) (05/04 1200) Temp Source: Oral (05/04 1200) BP: 119/37 mmHg (05/04 1200) Pulse Rate: 65 (05/04 0800)  Labs:  Recent Labs  05/28/15 0954 05/29/15 0430 05/29/15 2030 05/30/15 0517 05/30/15 0518 05/30/15 1000 05/30/15 1339  HGB 9.8* 9.4*  --  9.4*  --  10.3*  --   HCT 30.5* 29.7*  --  30.5*  --  32.2*  --   PLT 398 393  --  368  --  382  --   LABPROT 18.8* 20.1*  --  18.6*  --   --   --   INR 1.57* 1.72*  --  1.54*  --   --   --   HEPARINUNFRC  --   --  <0.10*  --  <0.10*  --  0.27*  CREATININE 1.72* 1.33*  --   --   --  1.05*  --     Estimated Creatinine Clearance: 34.7 mL/min (by C-G formula based on Cr of 1.05).   Medications:  Scheduled:  . fluconazole (DIFLUCAN) IV  200 mg Intravenous Q24H  . insulin aspart  0-9 Units Subcutaneous TID WC  . lactated ringers  1,000 mL Intravenous Once  . lip balm   Topical BID  . piperacillin-tazobactam (ZOSYN)  IV  3.375 g Intravenous Q8H  . sodium chloride flush  10-40 mL Intracatheter Q12H  . vancomycin  125 mg Oral Q6H  . verapamil  120 mg Oral Daily    Assessment: [name].  Heparin level now just shy of goal range on current rate of 1250 units/hr.  Hg is 10.3 and pltc wnl.  No bleeding noted and no issues with the IV pump reported per d/w RN.  Goal of Therapy:  Heparin level 0.3-0.7 units/ml Monitor platelets by anticoagulation protocol: Yes   Plan:  Increase heparin to 1350 units/hr Repeat Heparin level 6hr Watch for s/s of bleeding  [name], [facility]

## 2015-05-30 NOTE — Progress Notes (Signed)
3 Days Post-Op  Subjective: She is still tender LLQ, having multiple loose stools and distended.   Objective: Vital signs in last 24 hours: Temp:  [97.5 F (36.4 C)-98.6 F (37 C)] 97.5 F (36.4 C) (05/04 0755) Pulse Rate:  [59-66] 65 (05/04 0800) Resp:  [17-32] 24 (05/04 1000) BP: (106-132)/(37-101) 127/50 mmHg (05/04 1000) SpO2:  [98 %-100 %] 100 % (05/04 0800) Last BM Date: 05/30/15 240 PO TNA/1120 Voided x 12 13 stools recorded yesterday  Afebrile, VSS Creatinine 1.05, K+ 5.3 WBC 11.1 CT is pending Intake/Output from previous day: 05/03 0701 - 05/04 0700 In: 3256.3 [P.O.:240; I.V.:1436.3; IV Piggyback:460; TPN:1120] Out: 200 [Urine:200] Intake/Output this shift: Total I/O In: 650 [I.V.:230; IV Piggyback:100; TPN:320] Out: -   General appearance: alert, cooperative and still tender GI: distended, still tender to palpation, she points to LLQ as the main source of pain.  Lab Results:   Recent Labs  05/30/15 0517 05/30/15 1000  WBC 10.2 11.1*  HGB 9.4* 10.3*  HCT 30.5* 32.2*  PLT 368 382    BMET  Recent Labs  05/29/15 0430 05/30/15 1000  NA 128* 132*  K 4.6 5.3*  CL 94* 100*  CO2 26 22  GLUCOSE 125* 127*  BUN 28* 23*  CREATININE 1.33* 1.05*  CALCIUM 8.0* 8.2*   PT/INR  Recent Labs  05/29/15 0430 05/30/15 0517  LABPROT 20.1* 18.6*  INR 1.72* 1.54*     Recent Labs Lab 05/25/15 1639 05/28/15 0954 05/30/15 1000  AST 27 17 21   ALT 21 15 12*  ALKPHOS 109 82 94  BILITOT 0.8 0.8 0.5  PROT 6.4* 5.5* 5.8*  ALBUMIN 2.4* 2.1* 2.1*     Lipase     Component Value Date/Time   LIPASE 25 05/25/2015 1639     Studies/Results: No results found.  Medications: . fluconazole (DIFLUCAN) IV  200 mg Intravenous Q24H  . insulin aspart  0-9 Units Subcutaneous TID WC  . lactated ringers  1,000 mL Intravenous Once  . lip balm   Topical BID  . piperacillin-tazobactam (ZOSYN)  IV  3.375 g Intravenous Q8H  . potassium chloride  30 mEq Oral BID  .  sodium chloride flush  10-40 mL Intracatheter Q12H  . torsemide  20 mg Oral Once  . vancomycin  125 mg Oral Q6H  . verapamil  120 mg Oral Daily   . Marland KitchenTPN (CLINIMIX-E) Adult 70 mL/hr at 05/29/15 1712   And  . fat emulsion 240 mL (05/29/15 1712)  . sodium chloride 55 mL/hr at 05/29/15 1800  . heparin 1,250 Units/hr (05/30/15 [id number])    Assessment/Plan Diverticulitis with perforation  C diff colitis on screen done 05/26/15 Chronic anticoagulation with atrial fibrillation (INR 1.87 today) Tachy-brady syndrome with PTVP AODM Hypertension Hx of CVA Hyponatremia Renal insuffiencey Leukocytosis  FEN: NPO/TNA ID: day 6 antibiotics; day 5 diflucan, PO vancomycin day 4, back on Zosyn 05/29/15, day 2 VTE: Heparin drip/SCD    Plan:  Going down for CT now, Dr. Georgette Dover will review and decide on surgery later today.       LOS: 5 days    Ellington Cornia 05/30/2015 [phone]

## 2015-05-30 NOTE — Progress Notes (Signed)
ANTICOAGULATION CONSULT NOTE - Follow Up Consult  Pharmacy Consult for Heparin Indication: atrial fibrillation  Allergies  Allergen Reactions  . Anti-Inflammatory Enzyme [Nutritional Supplements] Other (See Comments)    Retains fluids and headaches  . Iodinated Diagnostic Agents Hives    HIVES 15MIN S/P IV CONTRAST INJECTION,WILL NEED 13 HR PREP FOR FUTURE INJECTIONS, ok s/p 50mg  po benadryl//a.calhoun  . Spironolactone Other (See Comments)    Hair loss  . Arthrotec [Diclofenac-Misoprostol] Other (See Comments)    unknown  . Biaxin [Clarithromycin] Other (See Comments)    unknown  . Plavix [Clopidogrel Bisulfate] Other (See Comments)    unknown    Patient Measurements: Height: 5\' 5"  (165.1 cm) (05/25/15 @ 1352) Weight: 152 lb (68.947 kg) IBW/kg (Calculated) : 57  Vital Signs: Temp: 98.6 F (37 C) (05/04 0400) Temp Source: Oral (05/04 0400) BP: 106/53 mmHg (05/04 0400) Pulse Rate: 60 (05/04 0400)  Labs:  Recent Labs  05/28/15 0954 05/29/15 0430 05/29/15 2030 05/30/15 0517 05/30/15 0518  HGB 9.8* 9.4*  --  9.4*  --   HCT 30.5* 29.7*  --  30.5*  --   PLT 398 393  --  368  --   LABPROT 18.8* 20.1*  --  18.6*  --   INR 1.57* 1.72*  --  1.54*  --   HEPARINUNFRC  --   --  <0.10*  --  <0.10*  CREATININE 1.72* 1.33*  --   --   --     Estimated Creatinine Clearance: 27.4 mL/min (by C-G formula based on Cr of 1.33).   Assessment: 82 year old female on IV heparin for atrial fibrillation (Coumadin on hold; INR down to 1.54). Heparin level remains undetectable on 1000 units/hr. CBC stable. No issues with line or bleeding reported per RN.  Goal of Therapy:  Heparin level 0.3-0.7 units/ml Monitor platelets by anticoagulation protocol: Yes   Plan:  Increase heparin to 1250 units/hr and recheck level in 8 hours  Sherlon Handing, [name], BCPS Clinical pharmacist, pager [phone] 05/30/2015,6:02 AM

## 2015-05-30 NOTE — Progress Notes (Signed)
Family Medicine Teaching Service Daily Progress Note Intern Pager: [phone]  Patient name: [name] Medical record number: [id number] Date of birth: [birth year] Age: 82 y.o. Gender: female  Primary Care Provider: [name], MD Consultants: ID, surgery  Code Status: Full   Pt Overview and Major Events to Date:  4/28: Readmit for Diverticulitis with possible perforation / abscess  Assessment and Plan: [name] is a 82 y.o. female presenting with LLQ abdominal pain. PMH is significant for atrial fibrillation, hx of CVA, hypertension, type 2 diabetes, tachycardia-bradycardia syndrome s/p pacemaker, prior pericardial effusion s/p pericardial window 12/16, sleep apnea (no CPAP), stroke (right frontal 2013), GI bleeding with hemorrhoids, and arthritis and recurrent diverticulitis.  Recurrent diverticulitis with possible perforation / abscess: CT showed: diverticulitis involving the sigmoid colon with previously noted extra luminal collection of gas is again noted and is increased in size from previous exam, worrisome for abscess. Additionally, there are several additional suspicious collections of fluid and gas which may represent abscesses. WBC 27.5 > 24.7> 17.2>13.1>10.2 Patient with positive C. Diff  - Repeat  CT Abdomen/Pelvis today to assess status of diverticulitis; possible surgery on Friday if indicated. - Antibiotics: 4/29 Zosyn >> ; 4/30 Fluc >> - Pain control: Morphine 1-2 mg q3hrs prn  C.difficule positive : Continues to have bowel movements, no symptomatic changes  - Continue PO vancomycin 150 mg QID (5/1 PO vancomycin>>) - ID consulted, appreciate recs  - Contact precautions   Chronic Atrial fibrillation: Ventricular Pacemaker for AF. INR supra-therapeutic on 4/26. INR 1.57>1.72>1.54 - Continue Verapamil and Digoxin  - D/C warfarin, started heparin drip  - INR needs to be less than 1.5 post surgery  - Consider adding back on Torsemide 20 mg, as patient  with low extremity edema and kidney function improved   DM2: Diet contorlled. A1c 6.2 (12/2014)> 6.3  - monitor cbgs; SSI sensitive   Hyponatremia: Na 128>130> 128>132 - Being replaced TPN, and going down of NS    Oral Thrush - Continue Fluconazole   Anemia: Hgb 10.4 on admit (baseline 11). History of diverticulitis and hemorrhoids - Hgb 9.7> 9.8 > 9.4   FEN/GI: Clear Liquid diet; NS@ 55 ml, TPN  Prophylaxis: Heparin   Disposition: Step-down   Subjective:  Patient remains frustrated. She let me know she is not ready to give up yet and wants to have surgery. I let her know the plans.   Objective: Temp:  [97.5 F (36.4 C)-98.6 F (37 C)] 97.5 F (36.4 C) (05/04 0755) Pulse Rate:  [59-66] 65 (05/04 0800) Resp:  [17-32] 29 (05/04 0800) BP: (106-132)/(37-101) 132/59 mmHg (05/04 0800) SpO2:  [98 %-100 %] 100 % (05/04 0800) Physical Exam: General: NAD, lying in bed  Cardiovascular: regular rate and rhythm with 2/6 murmur, paced rhythm  Respiratory: slight bibasilar crackles, normal effort, 2+ radial pulses  Abdomen: Soft, guarding, tenderness to palpation in LLQ Extremities:mid-calf 2+ lower extremity edema   Laboratory:  Recent Labs Lab 05/28/15 0954 05/29/15 0430 05/30/15 0517  WBC 17.2* 13.1* 10.2  HGB 9.8* 9.4* 9.4*  HCT 30.5* 29.7* 30.5*  PLT 398 393 368    Recent Labs Lab 05/25/15 1639  05/27/15 0530 05/28/15 0954 05/29/15 0430  NA 133*  < > 128* 130* 128*  K 3.0*  < > 4.8 4.5 4.6  CL 84*  < > 86* 91* 94*  CO2 35*  < > 30 29 26   BUN 37*  < > 45* 37* 28*  CREATININE 1.44*  < >  2.04* 1.72* 1.33*  CALCIUM 8.8*  < > 8.5* 8.3* 8.0*  PROT 6.4*  --   --  5.5*  --   BILITOT 0.8  --   --  0.8  --   ALKPHOS 109  --   --  82  --   ALT 21  --   --  15  --   AST 27  --   --  17  --   GLUCOSE 111*  < > 147* 89 125*  < > = values in this interval not displayed.  Imaging/Diagnostic Tests: Dg Chest Port 1 View  05/28/2015  CLINICAL DATA:  PICC line  placement EXAM: PORTABLE CHEST 1 VIEW COMPARISON:  05/01/2015 FINDINGS: Cardiomegaly again noted. Three leads cardiac pacemaker is unchanged in position. No acute infiltrate or pulmonary edema. There is left arm PICC line with tip in SVC right atrium junction. No pneumothorax. IMPRESSION: Cardiomegaly. No active disease. Left arm PICC line in place. No pneumothorax. Electronically Signed   By: Lahoma Crocker M.D.   On: 05/28/2015 10:22    Mylani Gentry Cletis [employer], MD 05/30/2015, 8:57 AM PGY-1, [place] Intern pager: [phone], text pages welcome

## 2015-05-30 NOTE — Progress Notes (Signed)
INFECTIOUS DISEASE PROGRESS NOTE  ID: [name] is a 82 y.o. female with  Principal Problem:   Diverticulitis of intestine with perforation and abscess Active Problems:   Chronic anticoagulation - Coumadin, CHADS2VASC=7   HTN (hypertension)   Pacemaker   Diabetes ([name])   History of CVA (cerebrovascular accident)   Tachycardia-bradycardia syndrome with symptomatic bradycardia   Chronic atrial fibrillation (HCC)   Diverticulitis   Supratherapeutic INR   CKD (chronic kidney disease) stage 3, GFR 30-59 ml/min   HOH (hard of hearing)   Protein-calorie malnutrition, moderate (HCC)   Diverticulitis of large intestine with perforation and abscess without bleeding  Subjective: 10 BM in last day per pt.  Cont abd pain.   Abtx:  Anti-infectives    Start     Dose/Rate Route Frequency Ordered Stop   05/29/15 1200  piperacillin-tazobactam (ZOSYN) IVPB 3.375 g     3.375 g 12.5 mL/hr over 240 Minutes Intravenous Every 8 hours 05/29/15 0943     05/27/15 1200  piperacillin-tazobactam (ZOSYN) IVPB 2.25 g  Status:  Discontinued     2.25 g 100 mL/hr over 30 Minutes Intravenous Every 6 hours 05/27/15 0832 05/29/15 0943   05/27/15 1200  vancomycin (VANCOCIN) 50 mg/mL oral solution 125 mg     125 mg Oral Every 6 hours 05/27/15 0957     05/27/15 1000  metroNIDAZOLE (FLAGYL) IVPB 500 mg  Status:  Discontinued     500 mg 100 mL/hr over 60 Minutes Intravenous Every 8 hours 05/27/15 0957 05/28/15 1617   05/27/15 0600  cefoTEtan (CEFOTAN) 2 g in dextrose 5 % 50 mL IVPB  Status:  Discontinued     2 g 100 mL/hr over 30 Minutes Intravenous To [facility]Stay [facility] 05/26/15 0832 05/27/15 1051   05/26/15 1300  metroNIDAZOLE (FLAGYL) tablet 500 mg  Status:  Discontinued     500 mg Oral 3 times per day on Sun 05/26/15 0928 05/26/15 0931   05/26/15 1300  neomycin (MYCIFRADIN) tablet 1,000 mg     1,000 mg Oral 3 times per day on Sun 05/26/15 0930 05/26/15 2139   05/26/15 1300  metroNIDAZOLE (FLAGYL)  tablet 1,000 mg     1,000 mg Oral 3 times per day on Sun 05/26/15 0931 05/26/15 2139   05/26/15 0832  metroNIDAZOLE (FLAGYL) tablet 500 mg  Status:  Discontinued    Comments:  Take 2 pills (=1000mg ) by mouth at 1pm, 3pm, and 10pm the day before your colorectal operation   500 mg Oral As directed 05/26/15 0832 05/26/15 0925   05/26/15 0832  neomycin (MYCIFRADIN) tablet 500 mg  Status:  Discontinued     500 mg Oral As directed 05/26/15 [id number] 05/26/15 0930   05/26/15 0815  fluconazole (DIFLUCAN) IVPB 200 mg     200 mg 100 mL/hr over 60 Minutes Intravenous Every 24 hours 05/26/15 0811     05/26/15 0600  piperacillin-tazobactam (ZOSYN) IVPB 3.375 g  Status:  Discontinued     3.375 g 12.5 mL/hr over 240 Minutes Intravenous Every 8 hours 05/25/15 2155 05/27/15 0832   05/25/15 1900  piperacillin-tazobactam (ZOSYN) IVPB 3.375 g     3.375 g 100 mL/hr over 30 Minutes Intravenous  Once 05/25/15 1851 05/25/15 2002      Medications:  Scheduled: . fluconazole (DIFLUCAN) IV  200 mg Intravenous Q24H  . insulin aspart  0-9 Units Subcutaneous TID WC  . lactated ringers  1,000 mL Intravenous Once  . lip balm   Topical BID  . piperacillin-tazobactam (ZOSYN)  IV  3.375 g Intravenous Q8H  . potassium chloride  30 mEq Oral BID  . sodium chloride flush  10-40 mL Intracatheter Q12H  . torsemide  20 mg Oral Once  . vancomycin  125 mg Oral Q6H  . verapamil  120 mg Oral Daily    Objective: Vital signs in last 24 hours: Temp:  [97.5 F (36.4 C)-98.6 F (37 C)] 97.5 F (36.4 C) (05/04 0755) Pulse Rate:  [59-66] 65 (05/04 0800) Resp:  [17-32] 24 (05/04 1000) BP: (106-132)/(37-101) 127/50 mmHg (05/04 1000) SpO2:  [98 %-100 %] 100 % (05/04 0800)   General appearance: alert, cooperative and no distress Resp: clear to auscultation bilaterally Cardio: regular rate and rhythm GI: normal findings: bowel sounds normal and abnormal findings:  guarding and LLQ tenderness with mass Extremities: edema 3+  BLE  Lab Results  Recent Labs  05/29/15 0430 05/30/15 0517 05/30/15 1000  WBC 13.1* 10.2 11.1*  HGB 9.4* 9.4* 10.3*  HCT 29.7* 30.5* 32.2*  NA 128*  --  132*  K 4.6  --  5.3*  CL 94*  --  100*  CO2 26  --  22  BUN 28*  --  23*  CREATININE 1.33*  --  1.05*   Liver Panel  Recent Labs  05/28/15 0954 05/30/15 1000  PROT 5.5* 5.8*  ALBUMIN 2.1* 2.1*  AST 17 21  ALT 15 12*  ALKPHOS 82 94  BILITOT 0.8 0.5   Sedimentation Rate No results for input(s): ESRSEDRATE in the last 72 hours. C-Reactive Protein No results for input(s): CRP in the last 72 hours.  Microbiology: Recent Results (from the past 240 hour(s))  C difficile quick scan w PCR reflex     Status: Abnormal   Collection Time: 05/26/15 11:21 AM  Result Value Ref Range Status   C Diff antigen NEGATIVE NEGATIVE Final   C Diff toxin POSITIVE (A) NEGATIVE Final    Comment: CDIFF PCR WAS RAN AND CONFIRMED POSITIVE CRITICAL RESULT CALLED TO, READ BACK BY AND VERIFIED WITH: R.GREENAWALT,RN 05/26/15 @1729     C Diff interpretation Results are indeterminate. See PCR results.  Final  Clostridium Difficile by PCR     Status: Abnormal   Collection Time: 05/26/15 11:21 AM  Result Value Ref Range Status   Toxigenic C Difficile by pcr POSITIVE (A) NEGATIVE Final    Comment: CRITICAL RESULT CALLED TO, READ BACK BY AND VERIFIED WITH: R.GREENAWALT,RN 05/26/15 @1730  BY [name]   MRSA PCR Screening     Status: None   Collection Time: 05/27/15  7:06 PM  Result Value Ref Range Status   MRSA by PCR NEGATIVE NEGATIVE Final    Comment:        The GeneXpert MRSA Assay (FDA approved for NASAL specimens only), is one component of a comprehensive MRSA colonization surveillance program. It is not intended to diagnose MRSA infection nor to guide or monitor treatment for MRSA infections.     Studies/Results: No results found.   Assessment/Plan: Diverticulitis with abscess, perforation C diff ARF, CRI Protein calorie  malnutrition, mod-severe  Total days of antibiotics: 5 vanco IV/PO, zosyn, Diflucan         Await repeat CT scan No change in atbx for now.  Family asks about fecal txp- we don't do this in hospital. Could f/u with Dr [name] in [facility] after d/c and after her abscess has resolved.   [name] Infectious Diseases (pager) [phone] [email or web address] 05/30/2015, 12:00 PM  LOS: 5 days

## 2015-05-30 NOTE — Progress Notes (Addendum)
PARENTERAL NUTRITION CONSULT NOTE - FOLLOW UP  Pharmacy Consult for tpn Indication: diverticulitis with perforation  Allergies  Allergen Reactions  . Anti-Inflammatory Enzyme [Nutritional Supplements] Other (See Comments)    Retains fluids and headaches  . Iodinated Diagnostic Agents Hives    HIVES 15MIN S/P IV CONTRAST INJECTION,WILL NEED 13 HR PREP FOR FUTURE INJECTIONS, ok s/p 50mg  po benadryl//a.calhoun  . Spironolactone Other (See Comments)    Hair loss  . Arthrotec [Diclofenac-Misoprostol] Other (See Comments)    unknown  . Biaxin [Clarithromycin] Other (See Comments)    unknown  . Plavix [Clopidogrel Bisulfate] Other (See Comments)    unknown    Patient Measurements: Height: 5\' 5"  (165.1 cm) (05/25/15 @ 1352) Weight: 152 lb (68.947 kg) IBW/kg (Calculated) : 57 Adjusted Body Weight:  Usual Weight:   Vital Signs: Temp: 97.5 F (36.4 C) (05/04 0755) Temp Source: Oral (05/04 0755) BP: 119/37 mmHg (05/04 1200) Pulse Rate: 65 (05/04 0800) Intake/Output from previous day: 05/03 0701 - 05/04 0700 In: 3256.3 [P.O.:240; I.V.:1436.3; IV Piggyback:460; TPN:1120] Out: 200 [Urine:200] Intake/Output from this shift: Total I/O In: 847.5 [I.V.:347.5; IV Piggyback:100; TPN:400] Out: -   Labs:  Recent Labs  05/28/15 0954 05/29/15 0430 05/30/15 0517 05/30/15 1000  WBC 17.2* 13.1* 10.2 11.1*  HGB 9.8* 9.4* 9.4* 10.3*  HCT 30.5* 29.7* 30.5* 32.2*  PLT 398 393 368 382  INR 1.57* 1.72* 1.54*  --      Recent Labs  05/27/15 1451 05/28/15 0954 05/29/15 0430 05/30/15 1000  NA  --  130* 128* 132*  K  --  4.5 4.6 5.3*  CL  --  91* 94* 100*  CO2  --  29 26 22   GLUCOSE  --  89 125* 127*  BUN  --  37* 28* 23*  CREATININE  --  1.72* 1.33* 1.05*  CALCIUM  --  8.3* 8.0* 8.2*  MG  --  1.6* 2.1 1.9  PHOS  --  2.9 2.3* 2.8  PROT  --  5.5*  --  5.8*  ALBUMIN  --  2.1*  --  2.1*  AST  --  17  --  21  ALT  --  15  --  12*  ALKPHOS  --  82  --  94  BILITOT  --  0.8  --  0.5   PREALBUMIN 4.5*  --   --   --   TRIG  --  54  --   --    Estimated Creatinine Clearance: 34.7 mL/min (by C-G formula based on Cr of 1.05).    Recent Labs  05/29/15 1701 05/29/15 2130 05/30/15 0758  GLUCAP 142* 140* 142*    Medications:  Scheduled:  . fluconazole (DIFLUCAN) IV  200 mg Intravenous Q24H  . insulin aspart  0-9 Units Subcutaneous TID WC  . lactated ringers  1,000 mL Intravenous Once  . lip balm   Topical BID  . piperacillin-tazobactam (ZOSYN)  IV  3.375 g Intravenous Q8H  . potassium chloride  30 mEq Oral BID  . sodium chloride flush  10-40 mL Intracatheter Q12H  . torsemide  20 mg Oral Once  . vancomycin  125 mg Oral Q6H  . verapamil  120 mg Oral Daily    Insulin Requirements in the past 24 hours:  3 units sensitive SSI  Assessment: 82 y/o female with several recent admissions for diverticulitis admitted 05/25/2015 with diverticulitis/abscess with microperforation. She has previously been treated conservatively with antibiotics but plan is for colectomy once C diff  infection is under control.  Surgeries/Procedures: 4/29 CT abd - persistent diverticulitis with possible abscess  GI:  CT showed diverticulitis involving sigmoid colon worrisome for abscess. Unsure if they can be drained. Plan for colectomy after C.diff treatment.  LLQ tender, mult loose stools, abd distended.  CT today, considering OR later today.  albumin 2.4, prealbumin 4.5<<6.6, LBM 5/1.  Endo: A1c 5.2, CBGs 140-142 Lytes: Na 132, K 5.3- not hemolyzed, Mg 1.9, Phos 2.8, CoCa 9.18 Renal: SCr down to 1.33, CrCl ~30 ml/min, +1.7 L/ 24 hrs, NS @ 85 mL/hr Pulm: 100% on RA Cards: BP soft, HR 60s Hepatobil: LFTs wnl, Tbili 0.8 ID: D#5 of Zosyn, metronidazole, fluconazole for diverticulitis; C diff + on vancomycin PO; WBC 11.1 AC/heme: warfarin PTA for Afib, INR 1.72 - warfarin held; Hb 9.4, plt wnl  Best Practices: SCDs  TPN Access: PICC 5/2 >> TPN start date: 5/2 >>  Nutritional Goals (per  RD recommendations):  Kcal: 1500-1700 Protein: 70-80 gm  Current Nutrition:  Clear liquids  Plan:  -Change to Clinimix 5/15 (no lytes) to 70 mL/hr and IV lipids at 10 mL/hr. This will provide 1672 Kcal and 84 gm of protein which meets 100% of nutritional goals.  -Add MVI and TE to TPN  -Continue sensitive sliding scale -Add NaCl to TPN bag to make it isotonic and decrease normal saline infusion rate to 55 mL/hr to accommodate increase in TPN rate and minimize volume overload. Discussed with MD. Monitor sodium levels  - BMet, Phos in AM   [name], [name] [facility]   05/30/15 [phone]) Addendum Noted pt on schedule KCl 56mEq bid, will d/c this due to increased K.  F/U K in AM.  [name], [name] Clinical Pharmacist [facility]

## 2015-05-31 ENCOUNTER — Inpatient Hospital Stay (HOSPITAL_COMMUNITY): Payer: Medicare Other | Admitting: Certified Registered Nurse Anesthetist

## 2015-05-31 ENCOUNTER — Encounter (HOSPITAL_COMMUNITY): Payer: Self-pay | Admitting: Certified Registered Nurse Anesthetist

## 2015-05-31 ENCOUNTER — Encounter: Payer: Self-pay | Admitting: Emergency Medicine

## 2015-05-31 ENCOUNTER — Encounter (HOSPITAL_COMMUNITY)
Admission: EM | Disposition: A | Discharge status: Rehabilitation Facility | Payer: Self-pay | Source: Home / Self Care | Attending: Family Medicine

## 2015-05-31 DIAGNOSIS — Z933 Colostomy status: Secondary | ICD-10-CM

## 2015-05-31 DIAGNOSIS — Z9889 Other specified postprocedural states: Secondary | ICD-10-CM

## 2015-05-31 DIAGNOSIS — Z9689 Presence of other specified functional implants: Secondary | ICD-10-CM

## 2015-05-31 HISTORY — PX: BOWEL RESECTION: SHX1257

## 2015-05-31 HISTORY — PX: DEBRIDEMENT OF ABDOMINAL WALL ABSCESS: SHX6396

## 2015-05-31 HISTORY — PX: COLOSTOMY: SHX63

## 2015-05-31 HISTORY — PX: COLON RESECTION: SHX5231

## 2015-05-31 LAB — SURGICAL PCR SCREEN
MRSA, PCR: NEGATIVE
STAPHYLOCOCCUS AUREUS: NEGATIVE

## 2015-05-31 LAB — BASIC METABOLIC PANEL
ANION GAP: 11 (ref 5–15)
BUN: 22 mg/dL — AB (ref 6–20)
CALCIUM: 8 mg/dL — AB (ref 8.9–10.3)
CO2: 23 mmol/L (ref 22–32)
CREATININE: 0.9 mg/dL (ref 0.44–1.00)
Chloride: 102 mmol/L (ref 101–111)
GFR calc Af Amer: >60 mL/min (ref >60)
GFR calc non Af Amer: 55 mL/min — ABNORMAL LOW (ref >60)
GLUCOSE: 112 mg/dL — AB (ref 65–99)
Potassium: 3.9 mmol/L (ref 3.5–5.1)
Sodium: 136 mmol/L (ref 135–145)

## 2015-05-31 LAB — PROTIME-INR
INR: 1.51 — AB (ref 0.00–1.49)
PROTHROMBIN TIME: 18.3 s — AB (ref 11.6–15.2)

## 2015-05-31 LAB — GLUCOSE, CAPILLARY
GLUCOSE-CAPILLARY: 221 mg/dL — AB (ref 65–99)
Glucose-Capillary: 138 mg/dL — ABNORMAL HIGH (ref 65–99)
Glucose-Capillary: 230 mg/dL — ABNORMAL HIGH (ref 65–99)
Glucose-Capillary: 219 mg/dL — ABNORMAL HIGH (ref 65–99)

## 2015-05-31 LAB — CBC
HCT: 27.4 % — ABNORMAL LOW (ref 36.0–46.0)
HEMOGLOBIN: 8.4 g/dL — AB (ref 12.0–15.0)
MCH: 26 pg (ref 26.0–34.0)
MCHC: 30.7 g/dL (ref 30.0–36.0)
MCV: 84.8 fL (ref 78.0–100.0)
PLATELETS: 326 10*3/uL (ref 150–400)
RBC: 3.23 MIL/uL — ABNORMAL LOW (ref 3.87–5.11)
RDW: 15.8 % — AB (ref 11.5–15.5)
WBC: 7.7 10*3/uL (ref 4.0–10.5)

## 2015-05-31 LAB — PREPARE RBC (CROSSMATCH)

## 2015-05-31 LAB — HEPARIN LEVEL (UNFRACTIONATED): HEPARIN UNFRACTIONATED: 0.33 [IU]/mL (ref 0.30–0.70)

## 2015-05-31 LAB — PHOSPHORUS: Phosphorus: 2.6 mg/dL (ref 2.5–4.6)

## 2015-05-31 SURGERY — COLON RESECTION
Anesthesia: General | Site: Abdomen

## 2015-05-31 MED ORDER — ROCURONIUM BROMIDE 50 MG/5ML IV SOLN
INTRAVENOUS | Status: AC
  Filled 2015-05-31: qty 1

## 2015-05-31 MED ORDER — METRONIDAZOLE IN NACL 5-0.79 MG/ML-% IV SOLN
500.0000 mg | Freq: Three times a day (TID) | INTRAVENOUS | Status: DC
  Administered 2015-05-31 – 2015-06-05 (×14): 500 mg via INTRAVENOUS
  Filled 2015-05-31 (×18): qty 100

## 2015-05-31 MED ORDER — HEPARIN (PORCINE) IN NACL 100-0.45 UNIT/ML-% IJ SOLN
950.0000 [IU]/h | INTRAMUSCULAR | Status: DC
  Administered 2015-05-31: 1350 [IU]/h via INTRAVENOUS
  Administered 2015-06-01: 1250 [IU]/h via INTRAVENOUS
  Administered 2015-06-02 – 2015-06-04 (×2): 850 [IU]/h via INTRAVENOUS
  Administered 2015-06-05: 950 [IU]/h via INTRAVENOUS
  Filled 2015-05-31 (×8): qty 250

## 2015-05-31 MED ORDER — SODIUM CHLORIDE 0.9 % IV SOLN
INTRAVENOUS | Status: DC | PRN
  Administered 2015-05-31: 13:00:00 via INTRAVENOUS

## 2015-05-31 MED ORDER — FENTANYL CITRATE (PF) 100 MCG/2ML IJ SOLN
INTRAMUSCULAR | Status: AC
  Filled 2015-05-31: qty 2

## 2015-05-31 MED ORDER — LACTATED RINGERS IV SOLN
INTRAVENOUS | Status: DC
  Administered 2015-05-31 – 2015-06-13 (×2): via INTRAVENOUS

## 2015-05-31 MED ORDER — SUGAMMADEX SODIUM 200 MG/2ML IV SOLN
INTRAVENOUS | Status: AC
  Filled 2015-05-31: qty 2

## 2015-05-31 MED ORDER — PROPOFOL 10 MG/ML IV BOLUS
INTRAVENOUS | Status: DC | PRN
  Administered 2015-05-31: 100 mg via INTRAVENOUS

## 2015-05-31 MED ORDER — FENTANYL CITRATE (PF) 100 MCG/2ML IJ SOLN
INTRAMUSCULAR | Status: DC | PRN
  Administered 2015-05-31: 50 ug via INTRAVENOUS
  Administered 2015-05-31: 100 ug via INTRAVENOUS
  Administered 2015-05-31 (×2): 50 ug via INTRAVENOUS

## 2015-05-31 MED ORDER — LIDOCAINE 2% (20 MG/ML) 5 ML SYRINGE
INTRAMUSCULAR | Status: AC
  Filled 2015-05-31: qty 5

## 2015-05-31 MED ORDER — FENTANYL CITRATE (PF) 100 MCG/2ML IJ SOLN
25.0000 ug | INTRAMUSCULAR | Status: DC | PRN
  Administered 2015-05-31: 150 ug via INTRAVENOUS

## 2015-05-31 MED ORDER — PROPOFOL 10 MG/ML IV BOLUS
INTRAVENOUS | Status: AC
  Filled 2015-05-31: qty 20

## 2015-05-31 MED ORDER — SODIUM PHOSPHATES 45 MMOLE/15ML IV SOLN
20.0000 mmol | Freq: Once | INTRAVENOUS | Status: DC
  Filled 2015-05-31: qty 6.67

## 2015-05-31 MED ORDER — TRACE MINERALS CR-CU-MN-SE-ZN 10-1000-500-60 MCG/ML IV SOLN
INTRAVENOUS | Status: AC
  Administered 2015-05-31: 18:00:00 via INTRAVENOUS
  Filled 2015-05-31: qty 1680

## 2015-05-31 MED ORDER — HYDROMORPHONE HCL 1 MG/ML IJ SOLN
0.5000 mg | INTRAMUSCULAR | Status: DC | PRN
  Administered 2015-05-31: 1 mg via INTRAVENOUS

## 2015-05-31 MED ORDER — 0.9 % SODIUM CHLORIDE (POUR BTL) OPTIME
TOPICAL | Status: DC | PRN
  Administered 2015-05-31 (×4): 1000 mL

## 2015-05-31 MED ORDER — FENTANYL CITRATE (PF) 250 MCG/5ML IJ SOLN
INTRAMUSCULAR | Status: AC
  Filled 2015-05-31: qty 5

## 2015-05-31 MED ORDER — ONDANSETRON HCL 4 MG/2ML IJ SOLN
INTRAMUSCULAR | Status: AC
  Filled 2015-05-31: qty 2

## 2015-05-31 MED ORDER — HEMOSTATIC AGENTS (NO CHARGE) OPTIME
TOPICAL | Status: DC | PRN
  Administered 2015-05-31: 1 via TOPICAL

## 2015-05-31 MED ORDER — SUGAMMADEX SODIUM 200 MG/2ML IV SOLN
INTRAVENOUS | Status: DC | PRN
  Administered 2015-05-31: 200 mg via INTRAVENOUS

## 2015-05-31 MED ORDER — HEPARIN (PORCINE) IN NACL 100-0.45 UNIT/ML-% IJ SOLN
1350.0000 [IU]/h | INTRAMUSCULAR | Status: DC
  Filled 2015-05-31: qty 250

## 2015-05-31 MED ORDER — ONDANSETRON HCL 4 MG/2ML IJ SOLN
INTRAMUSCULAR | Status: DC | PRN
Start: 2015-05-31 — End: 2015-05-31
  Administered 2015-05-31: 4 mg via INTRAVENOUS

## 2015-05-31 MED ORDER — ROCURONIUM BROMIDE 100 MG/10ML IV SOLN
INTRAVENOUS | Status: DC | PRN
  Administered 2015-05-31: 10 mg via INTRAVENOUS
  Administered 2015-05-31: 40 mg via INTRAVENOUS

## 2015-05-31 MED ORDER — DEXAMETHASONE SODIUM PHOSPHATE 4 MG/ML IJ SOLN
INTRAMUSCULAR | Status: DC | PRN
Start: 2015-05-31 — End: 2015-05-31
  Administered 2015-05-31: 4 mg via INTRAVENOUS

## 2015-05-31 MED ORDER — FAT EMULSION 20 % IV EMUL
240.0000 mL | INTRAVENOUS | Status: AC
  Administered 2015-05-31: 240 mL via INTRAVENOUS
  Filled 2015-05-31: qty 250

## 2015-05-31 MED ORDER — METHYLENE BLUE 0.5 % INJ SOLN
INTRAVENOUS | Status: AC
  Filled 2015-05-31: qty 10

## 2015-05-31 MED ORDER — LIDOCAINE HCL (CARDIAC) 20 MG/ML IV SOLN
INTRAVENOUS | Status: DC | PRN
  Administered 2015-05-31: 100 mg via INTRAVENOUS

## 2015-05-31 MED ORDER — HYDROMORPHONE HCL 1 MG/ML IJ SOLN
INTRAMUSCULAR | Status: AC
  Filled 2015-05-31: qty 1

## 2015-05-31 SURGICAL SUPPLY — 57 items
BLADE SURG ROTATE 9660 (MISCELLANEOUS) IMPLANT
CANISTER SUCTION 2500CC (MISCELLANEOUS) ×3 IMPLANT
CHLORAPREP W/TINT 26ML (MISCELLANEOUS) ×3 IMPLANT
COVER MAYO STAND STRL (DRAPES) ×3 IMPLANT
COVER SURGICAL LIGHT HANDLE (MISCELLANEOUS) ×6 IMPLANT
DRAIN CHANNEL 19F RND (DRAIN) ×2 IMPLANT
DRAPE LAPAROSCOPIC ABDOMINAL (DRAPES) ×3 IMPLANT
DRAPE PROXIMA HALF (DRAPES) ×3 IMPLANT
DRAPE UTILITY XL STRL (DRAPES) ×3 IMPLANT
DRAPE WARM FLUID 44X44 (DRAPE) ×3 IMPLANT
DRSG OPSITE POSTOP 4X10 (GAUZE/BANDAGES/DRESSINGS) ×2 IMPLANT
DRSG OPSITE POSTOP 4X8 (GAUZE/BANDAGES/DRESSINGS) IMPLANT
ELECT BLADE 6.5 EXT (BLADE) IMPLANT
ELECT CAUTERY BLADE 6.4 (BLADE) ×6 IMPLANT
ELECT REM PT RETURN 9FT ADLT (ELECTROSURGICAL) ×3
ELECTRODE REM PT RTRN 9FT ADLT (ELECTROSURGICAL) ×1 IMPLANT
EVACUATOR SILICONE 100CC (DRAIN) ×2 IMPLANT
GLOVE BIO SURGEON STRL SZ7 (GLOVE) ×6 IMPLANT
GLOVE BIOGEL PI IND STRL 7.5 (GLOVE) ×2 IMPLANT
GLOVE BIOGEL PI INDICATOR 7.5 (GLOVE) ×4
GOWN STRL REUS W/ TWL LRG LVL3 (GOWN DISPOSABLE) ×6 IMPLANT
GOWN STRL REUS W/TWL LRG LVL3 (GOWN DISPOSABLE) ×18
HEMOSTAT SURGICEL 2X4 FIBR (HEMOSTASIS) ×2 IMPLANT
KIT BASIN OR (CUSTOM PROCEDURE TRAY) ×3 IMPLANT
KIT OSTOMY DRAINABLE 2.75 STR (WOUND CARE) ×2 IMPLANT
KIT ROOM TURNOVER OR (KITS) ×3 IMPLANT
LEGGING LITHOTOMY PAIR STRL (DRAPES) IMPLANT
LIGASURE IMPACT 36 18CM CVD LR (INSTRUMENTS) IMPLANT
NS IRRIG 1000ML POUR BTL (IV SOLUTION) ×10 IMPLANT
PACK GENERAL/GYN (CUSTOM PROCEDURE TRAY) ×3 IMPLANT
PAD ARMBOARD 7.5X6 YLW CONV (MISCELLANEOUS) ×3 IMPLANT
PENCIL BUTTON HOLSTER BLD 10FT (ELECTRODE) ×3 IMPLANT
RELOAD PROXIMATE 75MM BLUE (ENDOMECHANICALS) ×9 IMPLANT
RELOAD STAPLE 75 3.8 BLU REG (ENDOMECHANICALS) IMPLANT
SPONGE LAP 18X18 X RAY DECT (DISPOSABLE) ×6 IMPLANT
STAPLER CUT CVD 40MM GREEN (STAPLE) ×2 IMPLANT
STAPLER GUN LINEAR PROX 60 (STAPLE) ×2 IMPLANT
STAPLER PROXIMATE 75MM BLUE (STAPLE) ×2 IMPLANT
STAPLER VISISTAT 35W (STAPLE) ×3 IMPLANT
SUCTION POOLE TIP (SUCTIONS) ×3 IMPLANT
SURGILUBE 2OZ TUBE FLIPTOP (MISCELLANEOUS) IMPLANT
SUT ETHILON 2 0 FS 18 (SUTURE) ×2 IMPLANT
SUT PDS AB 1 TP1 96 (SUTURE) ×6 IMPLANT
SUT PROLENE 2 0 CT2 30 (SUTURE) IMPLANT
SUT PROLENE 2 0 KS (SUTURE) IMPLANT
SUT SILK 2 0 SH CR/8 (SUTURE) ×3 IMPLANT
SUT SILK 2 0 TIES 10X30 (SUTURE) ×3 IMPLANT
SUT SILK 3 0 SH CR/8 (SUTURE) ×5 IMPLANT
SUT SILK 3 0 TIES 10X30 (SUTURE) ×3 IMPLANT
SUT VIC AB 3-0 SH 18 (SUTURE) ×2 IMPLANT
SYR BULB IRRIGATION 50ML (SYRINGE) ×3 IMPLANT
TOWEL OR 17X26 10 PK STRL BLUE (TOWEL DISPOSABLE) ×6 IMPLANT
TRAY FOLEY CATH 16FRSI W/METER (SET/KITS/TRAYS/PACK) IMPLANT
TRAY PROCTOSCOPIC FIBER OPTIC (SET/KITS/TRAYS/PACK) IMPLANT
TUBE CONNECTING 12'X1/4 (SUCTIONS) ×1
TUBE CONNECTING 12X1/4 (SUCTIONS) ×2 IMPLANT
YANKAUER SUCT BULB TIP NO VENT (SUCTIONS) ×3 IMPLANT

## 2015-05-31 NOTE — Transfer of Care (Signed)
Immediate Anesthesia Transfer of Care Note  Patient: [name]  Procedure(s) Performed: Procedure(s): SIGMOID COLECTOMY (N/A) DESCENDING COLOSTOMY (N/A) SMALL BOWEL RESECTION (N/A) DRAINAGE OF PELVIC ABSCESS (N/A)  Patient Location: PACU  Anesthesia Type:General  Level of Consciousness: patient cooperative and responds to stimulation  Airway & Oxygen Therapy: Patient Spontanous Breathing and Patient connected to nasal cannula oxygen  Post-op Assessment: Report given to RN and Post -op Vital signs reviewed and stable  Post vital signs: Reviewed and stable  Last Vitals:  Filed Vitals:   05/31/15 0736 05/31/15 0925  BP: 105/67 112/71  Pulse: 81   Temp: 36.7 C   Resp: 30     Last Pain:  Filed Vitals:   05/31/15 0930  PainSc: Asleep      Patients Stated Pain Goal: 2 ([id number]  Complications: No apparent anesthesia complications

## 2015-05-31 NOTE — Anesthesia Preprocedure Evaluation (Addendum)
Anesthesia Evaluation  Patient identified by MRN, date of birth, ID band Patient awake    Reviewed: Allergy & Precautions, H&P , NPO status , Patient's Chart, lab work & pertinent test results  Airway Mallampati: II  TM Distance: >3 FB Neck ROM: Full    Dental no notable dental hx. (+) Teeth Intact, Dental Advisory Given   Pulmonary sleep apnea ,    Pulmonary exam normal breath sounds clear to auscultation       Cardiovascular hypertension, Pt. on medications + Peripheral Vascular Disease and +CHF  + dysrhythmias Atrial Fibrillation + pacemaker  Rhythm:Regular Rate:Normal     Neuro/Psych CVA negative psych ROS   GI/Hepatic negative GI ROS, Neg liver ROS,   Endo/Other  negative endocrine ROS  Renal/GU negative Renal ROS  negative genitourinary   Musculoskeletal  (+) Arthritis , Osteoarthritis,    Abdominal   Peds  Hematology negative hematology ROS (+) anemia ,   Anesthesia Other Findings   Reproductive/Obstetrics negative OB ROS                            Anesthesia Physical Anesthesia Plan  ASA: III  Anesthesia Plan: General   Post-op Pain Management:    Induction: Intravenous  Airway Management Planned: Oral ETT  Additional Equipment:   Intra-op Plan:   Post-operative Plan: Extubation in OR  Informed Consent: I have reviewed the patients History and Physical, chart, labs and discussed the procedure including the risks, benefits and alternatives for the proposed anesthesia with the patient or authorized representative who has indicated his/her understanding and acceptance.   Dental advisory given  Plan Discussed with: CRNA  Anesthesia Plan Comments:         Anesthesia Quick Evaluation

## 2015-05-31 NOTE — Plan of Care (Signed)
Problem: Activity: Goal: Risk for activity intolerance will decrease Outcome: Progressing Pt is weak but very agile considering her age. Pt is able to reposition herself in the bed and get up to the [facility] independently.

## 2015-05-31 NOTE — Plan of Care (Signed)
Problem: Nutrition: Goal: Adequate nutrition will be maintained Outcome: Not Progressing Due to clinical status pt unable to take anything but liquid PO.  Pt is on TPN/Fat emulsion.

## 2015-05-31 NOTE — Progress Notes (Signed)
Pt has [employer] pacemaker- Dr Ola Spurr informed- States that we can use a magnet

## 2015-05-31 NOTE — Progress Notes (Signed)
Family Medicine Teaching Service Daily Progress Note Intern Pager: [phone]  Patient name: [name] Medical record number: [id number] Date of birth: 1923/06/24 Age: 82 y.o. Gender: female  Primary Care Provider: [name], MD Consultants: ID, surgery  Code Status: Full   Pt Overview and Major Events to Date:  4/28: Readmit for Diverticulitis with possible perforation / abscess  Assessment and Plan: DORISE EGELER is a 82 y.o. female presenting with LLQ abdominal pain. PMH is significant for atrial fibrillation, hx of CVA, hypertension, type 2 diabetes, tachycardia-bradycardia syndrome s/p pacemaker, prior pericardial effusion s/p pericardial window 12/16, sleep apnea (no CPAP), stroke (right frontal 2013), GI bleeding with hemorrhoids, and arthritis and recurrent diverticulitis.  Recurrent diverticulitis with possible perforation / abscess: Repeat CT Interval development of colovesical fistula between the sigmoid colon and anterior superior left bladder wall. Sigmoid mesentery abscess is mildly decreased in size. Left anterior pelvic pericolonic abscess is mildly increased in size.  Bilateral indeterminate renal lesions requiring further imaging.  WBC 27.5 > 24.7> 17.2>13.1>10.2>7.7 Patient with positive C. Diff  -  Hartmann's procedure today at 10 AM  - Antibiotics: 4/29 Zosyn >> ; 4/30 Fluc >> - Pain control: Morphine 1-2 mg q3hrs prn - Renal mass requiring further imaging   C.difficule positive : Continues to have bowel movements, no symptomatic changes  - Continue PO vancomycin 150 mg QID (5/1 PO vancomycin>>) - ID consulted, appreciate recs  - Contact precautions   Chronic Atrial fibrillation: Ventricular Pacemaker for AF. INR supra-therapeutic on 4/26. INR 1.57>1.72>1.54>1.51 - Continue Verapamil and Digoxin  - D/C warfarin - Heparin D/C at 6 AM for surgery  - INR needs to be less than 1.5 post surgery  - Consider adding back on Torsemide 20 mg, as patient  with low extremity edema and kidney function improved   DM2: Diet contorlled. A1c 6.2 (12/2014)> 6.3  - monitor cbgs; SSI sensitive   Oral Thrush - Continue Fluconazole   Anemia: Hgb 10.4 on admit (baseline 11). History of diverticulitis and hemorrhoids - Hgb 9.7> 9.8 > 9.4>8.4  FEN/GI: Clear Liquid diet; NS@ 55 ml, TPN  Prophylaxis: Heparin   Disposition: Step-down   Subjective:  Patient to have surgery today. States that benzocaine spray provided did help with her rectal soreness.   Objective: Temp:  [97.5 F (36.4 C)-98.6 F (37 C)] 98.2 F (36.8 C) (05/05 0456) Pulse Rate:  [59-74] 71 (05/05 0600) Resp:  [21-37] 23 (05/05 0600) BP: (97-132)/(37-76) 97/54 mmHg (05/05 0600) SpO2:  [91 %-100 %] 93 % (05/05 0600) Physical Exam: General: NAD, lying in bed  Cardiovascular: regular rate and rhythm with 2/6 murmur, paced rhythm  Respiratory: CTAB, normal effort, 2+ radial pulses  Abdomen: Soft, guarding, tenderness to palpation in LLQ  Laboratory:  Recent Labs Lab 05/30/15 0517 05/30/15 1000 05/31/15 0430  WBC 10.2 11.1* 7.7  HGB 9.4* 10.3* 8.4*  HCT 30.5* 32.2* 27.4*  PLT 368 382 326    Recent Labs Lab 05/25/15 1639  05/28/15 0954 05/29/15 0430 05/30/15 1000 05/31/15 0420  NA 133*  < > 130* 128* 132* 136  K 3.0*  < > 4.5 4.6 5.3* 3.9  CL 84*  < > 91* 94* 100* 102  CO2 35*  < > 29 26 22 23   BUN 37*  < > 37* 28* 23* 22*  CREATININE 1.44*  < > 1.72* 1.33* 1.05* 0.90  CALCIUM 8.8*  < > 8.3* 8.0* 8.2* 8.0*  PROT 6.4*  --  5.5*  --  5.8*  --   BILITOT 0.8  --  0.8  --  0.5  --   ALKPHOS 109  --  82  --  94  --   ALT 21  --  15  --  12*  --   AST 27  --  17  --  21  --   GLUCOSE 111*  < > 89 125* 127* 112*  < > = values in this interval not displayed.  Imaging/Diagnostic Tests: Ct Abdomen Pelvis Wo Contrast  05/30/2015  CLINICAL DATA:  82 year old female inpatient with perforated sigmoid diverticulitis, presenting for follow-up. EXAM: CT ABDOMEN AND  PELVIS WITHOUT CONTRAST TECHNIQUE: Multidetector CT imaging of the abdomen and pelvis was performed following the standard protocol without IV contrast. COMPARISON:  05/25/2015 CT abdomen/pelvis. FINDINGS: Partially motion degraded study. Lower chest: New small layering bilateral pleural effusions, right slightly greater than left. Partially visualized pericardial effusions/thickening, at least small in size, unchanged. Two pacemaker leads are seen terminating in the right ventricular apex. Mild dependent atelectasis at both lung bases. Hepatobiliary: Hypodense 1.1 cm lesion in the lateral segment left liver lobe, poorly visualized due to motion on this scan, seen to represent a simple liver cyst on the 05/02/2015 CT study. Otherwise normal liver. Cholecystectomy. No biliary ductal dilatation. Pancreas: Normal, with no mass or duct dilation. Spleen: Normal size. No mass. Adrenals/Urinary Tract: Normal adrenals. No hydronephrosis. No renal stones. Simple exophytic 2.3 cm renal cyst in the anterior interpolar right kidney. Simple 2.5 cm renal cyst in the posterior interpolar left kidney. Stable exophytic isodense 1.7 cm renal mass in the posterior upper left kidney (series 2/ image 21), unchanged. Hyperdense 1.3 cm interpolar and 0.8 cm anterior upper right renal lesions, unchanged. There is a large volume of gas in the nondependent bladder lumen. There is a tiny amount of layering dense material in the bladder lumen probably representing oral contrast. There is a thick walled fistula between the left anterior superior bladder wall and mid sigmoid colon measuring approximately 3.0 cm in length and 1.7 cm in diameter (series 5/image 45), with gas and oral contrast within the fistulous tract. Stomach/Bowel: Tiny hiatal hernia. Otherwise collapsed and grossly normal stomach. Normal caliber small bowel with no small bowel wall thickening. The appendix is mildly diffusely dilated up to 11 mm diameter with borderline mild  appendiceal wall thickening, unchanged. There is probable minimal oral contrast within the appendiceal lumen. Re- demonstrated is moderate diverticulosis of the sigmoid colon. There is diffuse wall thickening and pericolonic fat stranding throughout the sigmoid colon. In the anterior left pelvis, there is a pericolonic 5.5 x 2.9 cm abscess containing gas and oral contrast (series 2/ image 61), previously 5.2 x 2.4 cm, mildly increased. There is a 3.0 x 2.7 cm thick-walled collection in the sigmoid mesentery (series 2/ image 55) containing a tiny amount oral contrast, mildly decreased from 3.9 x 2.5 cm. Oral contrast reaches the distal rectum. Vascular/Lymphatic: Atherosclerotic nonaneurysmal abdominal aorta. No pathologically enlarged lymph nodes in the abdomen or pelvis. Reproductive: Grossly normal uterus.  No adnexal mass. Other: Small volume ascites is increased. Musculoskeletal: No aggressive appearing focal osseous lesions. Moderate degenerative changes in the visualized thoracolumbar spine. Moderate anasarca is worsened. IMPRESSION: 1. Interval development of colovesical fistula between the sigmoid colon and anterior superior left bladder wall. Large amount of gas in the nondependent bladder. Layering oral contrast in the bladder. 2. Sigmoid mesentery abscess is mildly decreased in size. Left anterior pelvic pericolonic abscess is mildly increased in  size. 3. Stable mild diffuse dilatation of the appendix with borderline appendiceal wall thickening, favor reactive change. 4. Small volume ascites is increased. New small layering bilateral pleural effusions. Worsened anasarca. These findings suggest third-spacing. 5. Partially visualized chronic pericardial effusion/thickening, at least small in size, unchanged. 6. Bilateral indeterminate renal lesions for which renal mass protocol MRI or CT abdomen without and with IV contrast is recommended on a short term outpatient basis to exclude renal malignancy. 7. Tiny  hiatal hernia. Electronically Signed   By: Ilona Sorrel M.D.   On: 05/30/2015 14:13    Doloris Servantes Cletis Media, MD 05/31/2015, 7:01 AM PGY-1, [place] Intern pager: [phone], text pages welcome

## 2015-05-31 NOTE — Op Note (Signed)
Preop diagnosis: Perforated sigmoid diverticulitis with pelvic abscess and possible colovesical fistula Postop diagnosis: Same Procedure performed: Sigmoid colectomy, descending colostomy, small bowel resection, placement of pelvic abscess drain Surgeon:Namari Breton K. Asst.: Dr. Dyann Kief Indications: This is an 82 year old female with multiple medical issues who presents with severe sigmoid diverticulitis. There are signs of perforation with formation of an abscess. The abscess is not amenable to drainage. We attempted nonoperative treatment but she has not progressed. She also has been treated for Clostridium difficile colitis.  Repeat CT scan shows development of a colovesical fistula. She presents now for surgical resection.  Description of procedure: The patient is brought to the operating room and placed in a supine position on the operating room table. After an adequate level of general anesthesia was obtained, a Foley catheter was placed under sterile technique. Her abdomen was prepped with ChloraPrep and draped sterile fashion. We made a longitudinal Incision. We dissected down to the linea alba entered the peritoneal cavity sharply. There is some free fluid within the pelvis. The patient has easily palpable inflammatory mass in the left lower quadrant. This inflammatory mass was densely adherent to the left side of the pelvis as well as the posterior surface of the bladder. The descending colon above this area is normal. I could also palpate some normal rectum below this area. There is a single loop of small bowel that is adherent into the side of this inflammatory mass on the right side of the pelvis. We sharply dissected the small bowel away from the side of the inflammatory mass. This caused some serosal defects. We temporarily close the skin serosal defects with 3-0 silk sutures with plans to come back to [place] in case to reinspect.  We then spent some time trying to dissect the inflammatory  mass away from the bladder in the left pelvic sidewall. This involved a combination of sharp dissection as well as a lot of blunt finger fracture technique. There is a large amount raw surface on the posterior surface of the bladder. We are able to dissect this inflammatory mass away from the bladder abdominal wall. We mobilized the left colon proximally by dividing the white line of Toldt and rotating the colon medially. We divided the descending colon with a GIA-75 stapler. The LigaSure device was used to ligate the mesentery. We worked down until we were past the inflammatory mass. We isolated area of normal-appearing distal sigmoid colon and transected this with a green Contour stapler. The specimen was sent for pathologic examination. Some of the mesentery was oversewn with 3-0 silk sutures. At this point we inspected the posterior surface of the bladder. The nurse circulator instilled methylene blue and saline solution into the bladder. We distended it with over 200 mL of methylene blue solution. We carefully observed the raw surface on the posterior bladder. There is no sign of any blue dye extravasation. We then replaced the bladder to straight drain. We oversewed some of the raw surface with 3-0 Vicryl sutures to try to reapproximate some of the edges of the tissue. A 19 [place] Blake drain was brought through a stab incision in the right lower quadrant and placed in the pelvis behind the bladder. The rectal stump was inspected and was hemostatic. We then inspected the small bowel. This seemed to be questionable as far as the serosa defect. We made the decision to go ahead resect this short segment. We divided approximately distally with a GIA-75 stapler. And ligated the mesentery with the LigaSure device.  A side-to-side stapled anastomosis was created with a GIA-75 and a TA 60. The mesenteric defect was closed with 2-0 silk. A crotch suture 3-0 silk was placed. The remainder the small bowel is normal. We  excised a small circle skin on the patient's left side. We removed some a substantial fat and divided the muscle in a cruciate fashion. The colostomy was created by exteriorizing the staple line of the descending colon. We secured the drain in the right lower quadrant with a 2-0 Ethilon suture. We irrigated the abdomen thoroughly and inspected for hemostasis. The fascia was reapproximated with double-stranded #1 PDS suture. The subcutaneous tissues to straight irrigated and staples were used to close the skin. A honeycomb dressing was applied. We then matured the colostomy with interrupted 3-0 Vicryl sutures. An ostomy appliance was applied. The patient was then extubated and brought to the recovery room in stable condition. All sponge, instrument, and needle counts are correct.  Imogene Burn. Georgette Dover, MD, [facility] Surgery  General/ Trauma Surgery  05/31/2015 1:58 PM

## 2015-05-31 NOTE — Anesthesia Postprocedure Evaluation (Signed)
Anesthesia Post Note  Patient: [name]  Procedure(s) Performed: Procedure(s) (LRB): SIGMOID COLECTOMY (N/A) DESCENDING COLOSTOMY (N/A) SMALL BOWEL RESECTION (N/A) DRAINAGE OF PELVIC ABSCESS (N/A)  Patient location during evaluation: PACU Anesthesia Type: General Level of consciousness: awake and alert Pain management: pain level controlled Vital Signs Assessment: post-procedure vital signs reviewed and stable Respiratory status: spontaneous breathing, nonlabored ventilation, respiratory function stable and patient connected to nasal cannula oxygen Cardiovascular status: blood pressure returned to baseline and stable Postop Assessment: no signs of nausea or vomiting Anesthetic complications: no    Last Vitals:  Filed Vitals:   05/31/15 1430 05/31/15 1445  BP: 133/65 148/87  Pulse: 59 59  Temp:    Resp: 21 38    Last Pain:  Filed Vitals:   05/31/15 1446  PainSc: 7                  Barkley Kratochvil,W. EDMOND

## 2015-05-31 NOTE — Progress Notes (Signed)
ANTICOAGULATION CONSULT NOTE - Follow Up Consult  Pharmacy Consult for Heparin Indication: atrial fibrillation  Addnedum to prior note : Heparin to restart at 2200 PM per surgery consult order. Time moved and discussed with RN.  Sloan Leiter, [name], BCPS Clinical Pharmacist [phone]  05/31/2015,3:57 PM

## 2015-05-31 NOTE — Progress Notes (Signed)
ANTICOAGULATION CONSULT NOTE - Follow Up Consult  Pharmacy Consult for Heparin Indication: atrial fibrillation  Allergies  Allergen Reactions  . Anti-Inflammatory Enzyme [Nutritional Supplements] Other (See Comments)    Retains fluids and headaches  . Iodinated Diagnostic Agents Hives    HIVES 15MIN S/P IV CONTRAST INJECTION,WILL NEED 13 HR PREP FOR FUTURE INJECTIONS, ok s/p 50mg  po benadryl//a.calhoun  . Spironolactone Other (See Comments)    Hair loss  . Arthrotec [Diclofenac-Misoprostol] Other (See Comments)    unknown  . Biaxin [Clarithromycin] Other (See Comments)    unknown  . Plavix [Clopidogrel Bisulfate] Other (See Comments)    unknown    Patient Measurements: Height: 5\' 5"  (165.1 cm) (05/25/15 @ 1352) Weight: 152 lb (68.947 kg) IBW/kg (Calculated) : 57 Heparin Dosing Weight: 68 kg  Vital Signs: Temp: 98.2 F (36.8 C) (05/05 1335) Temp Source: Oral (05/05 0736) BP: 152/85 mmHg (05/05 1532) Pulse Rate: 60 (05/05 1532)  Labs:  Recent Labs  05/29/15 0430  05/30/15 0517  05/30/15 1000 05/30/15 1339 05/30/15 2025 05/31/15 0420 05/31/15 0430  HGB 9.4*  --  9.4*  --  10.3*  --   --   --  8.4*  HCT 29.7*  --  30.5*  --  32.2*  --   --   --  27.4*  PLT 393  --  368  --  382  --   --   --  326  LABPROT 20.1*  --  18.6*  --   --   --   --   --  18.3*  INR 1.72*  --  1.54*  --   --   --   --   --  1.51*  HEPARINUNFRC  --   < >  --   < >  --  0.27* 0.32  --  0.33  CREATININE 1.33*  --   --   --  1.05*  --   --  0.90  --   < > = values in this interval not displayed.  Estimated Creatinine Clearance: 40.5 mL/min (by C-G formula based on Cr of 0.9).   Medications:  Scheduled:  . fentaNYL      . fentaNYL      . fluconazole (DIFLUCAN) IV  200 mg Intravenous Q24H  . HYDROmorphone      . insulin aspart  0-9 Units Subcutaneous TID WC  . lip balm   Topical BID  . piperacillin-tazobactam (ZOSYN)  IV  3.375 g Intravenous Q8H  . sodium chloride flush  10-40 mL  Intracatheter Q12H  . sodium phosphate  Dextrose 5% IVPB  20 mmol Intravenous Once  . vancomycin  125 mg Oral Q6H  . verapamil  120 mg Oral Daily    Assessment: 82 yo female on heparin for AFib s/p colectomy. Pharmacy instructed to resume IV heparin at 2200 tonight.  H/H down post op. Plt wnl   Goal of Therapy:  Heparin level 0.3-0.7 units/ml Monitor platelets by anticoagulation protocol: Yes   Plan:  Resume heparin at 1350 units/hr. No bolus  F/u 8 hr HL Daily heparin level and CBC Watch for s/s of bleeding  Albertina Parr, [name]., BCPS Clinical Pharmacist Pager [phone]

## 2015-05-31 NOTE — Plan of Care (Signed)
Problem: Pain Managment: Goal: General experience of comfort will improve Outcome: Progressing Abdominal pain is intermittent and well controlled with current PRN medication.

## 2015-05-31 NOTE — Progress Notes (Signed)
PARENTERAL NUTRITION CONSULT NOTE - FOLLOW UP  Pharmacy Consult for tpn Indication: diverticulitis with perforation  Allergies  Allergen Reactions  . Anti-Inflammatory Enzyme [Nutritional Supplements] Other (See Comments)    Retains fluids and headaches  . Iodinated Diagnostic Agents Hives    HIVES 15MIN S/P IV CONTRAST INJECTION,WILL NEED 13 HR PREP FOR FUTURE INJECTIONS, ok s/p 50mg  po benadryl//a.calhoun  . Spironolactone Other (See Comments)    Hair loss  . Arthrotec [Diclofenac-Misoprostol] Other (See Comments)    unknown  . Biaxin [Clarithromycin] Other (See Comments)    unknown  . Plavix [Clopidogrel Bisulfate] Other (See Comments)    unknown    Patient Measurements: Height: 5\' 5"  (165.1 cm) (05/25/15 @ 1352) Weight: 152 lb (68.947 kg) IBW/kg (Calculated) : 57 Adjusted Body Weight:  Usual Weight:   Vital Signs: Temp: 98.1 F (36.7 C) (05/05 0736) Temp Source: Oral (05/05 0736) BP: 105/67 mmHg (05/05 0736) Pulse Rate: 81 (05/05 0736) Intake/Output from previous day: 05/04 0701 - 05/05 0700 In: 4598.2 [P.O.:850; I.V.:1629.4; IV Piggyback:200; TPN:1918.8] Out: -  Intake/Output from this shift:    Labs:  Recent Labs  05/29/15 0430 05/30/15 0517 05/30/15 1000 05/31/15 0430  WBC 13.1* 10.2 11.1* 7.7  HGB 9.4* 9.4* 10.3* 8.4*  HCT 29.7* 30.5* 32.2* 27.4*  PLT 393 368 382 326  INR 1.72* 1.54*  --  1.51*     Recent Labs  05/28/15 0954 05/29/15 0430 05/30/15 1000 05/31/15 0420  NA 130* 128* 132* 136  K 4.5 4.6 5.3* 3.9  CL 91* 94* 100* 102  CO2 29 26 22 23   GLUCOSE 89 125* 127* 112*  BUN 37* 28* 23* 22*  CREATININE 1.72* 1.33* 1.05* 0.90  CALCIUM 8.3* 8.0* 8.2* 8.0*  MG 1.6* 2.1 1.9  --   PHOS 2.9 2.3* 2.8 2.6  PROT 5.5*  --  5.8*  --   ALBUMIN 2.1*  --  2.1*  --   AST 17  --  21  --   ALT 15  --  12*  --   ALKPHOS 82  --  94  --   BILITOT 0.8  --  0.5  --   TRIG 54  --   --   --    Estimated Creatinine Clearance: 40.5 mL/min (by C-G  formula based on Cr of 0.9).    Recent Labs  05/30/15 1253 05/30/15 1632 05/30/15 2052  GLUCAP 128* 142* 141*    Medications:  Scheduled:  . fluconazole (DIFLUCAN) IV  200 mg Intravenous Q24H  . insulin aspart  0-9 Units Subcutaneous TID WC  . lip balm   Topical BID  . piperacillin-tazobactam (ZOSYN)  IV  3.375 g Intravenous Q8H  . sodium chloride flush  10-40 mL Intracatheter Q12H  . vancomycin  125 mg Oral Q6H  . verapamil  120 mg Oral Daily    Insulin Requirements in the past 24 hours:  3 units sensitive SSI  Assessment: 82 y/o female with several recent admissions for diverticulitis admitted 05/25/2015 with diverticulitis/abscess with microperforation. She has previously been treated conservatively with antibiotics but plan is for colectomy once C diff infection is under control.  Surgeries/Procedures: 4/29 CT abd - persistent diverticulitis with possible abscess  GI:  CT showed diverticulitis involving sigmoid colon with abscess that is slightly larger now. Hartmann's procedure planned for today.  LLQ tender, mult loose stools, abd distended.  CT today, considering OR later today.  albumin 2.4, prealbumin 4.5<<6.6, LBM 5/1.  Endo: A1c 5.2, CBGs 128-141 Lytes:  Na 136, K 3.9, Mg 1.9, Phos 2.6, CoCa 9.18 Renal: SCr down to 0.9, CrCl ~40 ml/min, +4.4 L/ 24 hrs, NS @ 55 mL/hr Pulm: 91% on RA Cards: VSS Hepatobil: LFTs wnl, Tbili 0.8 ID: D#[id number] of Zosyn, metronidazole, fluconazole for diverticulitis; C diff + on vancomycin PO; WBC wnl  AC/heme: warfarin PTA for Afib, INR 1.51 - warfarin held; Hb 8.4, plt wnl. IV heparin held for surgery today  Best Practices: SCDs  TPN Access: PICC 5/2 >> TPN start date: 5/2 >>  Nutritional Goals (per RD recommendations):  Kcal: 1500-1700 Protein: 70-80 gm  Current Nutrition:  Clear liquids  Plan:  -Change to Clinimix E 5/15 (WITH LYTES) at 70 mL/hr and IV lipids at 10 mL/hr. This will provide 1672 Kcal and 84 gm of protein  which meets 100% of nutritional goals.  -Add MVI and TE to TPN  -Sodium phosphate 20 mmol IV  -Continue sensitive sliding scale -Add NaCl to TPN bag to make it isotonic. Discussed with MD. Monitor sodium levels  - BMet, Phos in AM   Albertina Parr, [name]., BCPS Clinical Pharmacist Pager [phone]

## 2015-05-31 NOTE — Progress Notes (Signed)
INFECTIOUS DISEASE PROGRESS NOTE  ID: [name] is a 82 y.o. female with  Principal Problem:   Diverticulitis of intestine with perforation and abscess Active Problems:   Chronic anticoagulation - Coumadin, CHADS2VASC=7   HTN (hypertension)   Pacemaker   Diabetes ([place])   History of CVA (cerebrovascular accident)   Tachycardia-bradycardia syndrome with symptomatic bradycardia   Chronic atrial fibrillation (HCC)   Diverticulitis   Supratherapeutic INR   CKD (chronic kidney disease) stage 3, GFR 30-59 ml/min   HOH (hard of hearing)   Protein-calorie malnutrition, moderate (HCC)   Diverticulitis of large intestine with perforation and abscess without bleeding  Subjective: Uncomfortable with coughing, deep breathing.  "I'd like some pinto beans and corn bread"  Abtx:  Anti-infectives    Start     Dose/Rate Route Frequency Ordered Stop   05/29/15 1200  piperacillin-tazobactam (ZOSYN) IVPB 3.375 g     3.375 g 12.5 mL/hr over 240 Minutes Intravenous Every 8 hours 05/29/15 0943     05/27/15 1200  piperacillin-tazobactam (ZOSYN) IVPB 2.25 g  Status:  Discontinued     2.25 g 100 mL/hr over 30 Minutes Intravenous Every 6 hours 05/27/15 0832 05/29/15 0943   05/27/15 1200  vancomycin (VANCOCIN) 50 mg/mL oral solution 125 mg     125 mg Oral Every 6 hours 05/27/15 0957     05/27/15 1000  metroNIDAZOLE (FLAGYL) IVPB 500 mg  Status:  Discontinued     500 mg 100 mL/hr over 60 Minutes Intravenous Every 8 hours 05/27/15 0957 05/28/15 1617   05/27/15 0600  cefoTEtan (CEFOTAN) 2 g in dextrose 5 % 50 mL IVPB  Status:  Discontinued     2 g 100 mL/hr over 30 Minutes Intravenous To [facility] 05/26/15 0832 05/27/15 1051   05/26/15 1300  metroNIDAZOLE (FLAGYL) tablet 500 mg  Status:  Discontinued     500 mg Oral 3 times per day on Sun 05/26/15 0928 05/26/15 0931   05/26/15 1300  neomycin (MYCIFRADIN) tablet 1,000 mg     1,000 mg Oral 3 times per day on Sun 05/26/15 0930 05/26/15  2139   05/26/15 1300  metroNIDAZOLE (FLAGYL) tablet 1,000 mg     1,000 mg Oral 3 times per day on Sun 05/26/15 0931 05/26/15 2139   05/26/15 0832  metroNIDAZOLE (FLAGYL) tablet 500 mg  Status:  Discontinued    Comments:  Take 2 pills (=1000mg ) by mouth at 1pm, 3pm, and 10pm the day before your colorectal operation   500 mg Oral As directed 05/26/15 0832 05/26/15 0925   05/26/15 0832  neomycin (MYCIFRADIN) tablet 500 mg  Status:  Discontinued     500 mg Oral As directed 05/26/15 [id number] 05/26/15 0930   05/26/15 0815  fluconazole (DIFLUCAN) IVPB 200 mg     200 mg 100 mL/hr over 60 Minutes Intravenous Every 24 hours 05/26/15 0811     05/26/15 0600  piperacillin-tazobactam (ZOSYN) IVPB 3.375 g  Status:  Discontinued     3.375 g 12.5 mL/hr over 240 Minutes Intravenous Every 8 hours 05/25/15 2155 05/27/15 0832   05/25/15 1900  piperacillin-tazobactam (ZOSYN) IVPB 3.375 g     3.375 g 100 mL/hr over 30 Minutes Intravenous  Once 05/25/15 1851 05/25/15 2002      Medications:  Scheduled: . fentaNYL      . fentaNYL      . fluconazole (DIFLUCAN) IV  200 mg Intravenous Q24H  . HYDROmorphone      . insulin aspart  0-9 Units Subcutaneous  TID WC  . lip balm   Topical BID  . piperacillin-tazobactam (ZOSYN)  IV  3.375 g Intravenous Q8H  . sodium chloride flush  10-40 mL Intracatheter Q12H  . sodium phosphate  Dextrose 5% IVPB  20 mmol Intravenous Once  . vancomycin  125 mg Oral Q6H  . verapamil  120 mg Oral Daily    Objective: Vital signs in last 24 hours: Temp:  [98.1 F (36.7 C)-98.6 F (37 C)] 98.3 F (36.8 C) (05/05 1500) Pulse Rate:  [59-81] 60 (05/05 1532) Resp:  [21-38] 24 (05/05 1532) BP: (97-181)/(42-108) 152/85 mmHg (05/05 1532) SpO2:  [91 %-100 %] 97 % (05/05 1532)   General appearance: alert and moderate distress Resp: clear to auscultation bilaterally Cardio: regular rate and rhythm GI: abnormal findings:  hypoactive bowel sounds and drain Extremities: edema 2-3+ BLE  Lab  Results  Recent Labs  05/30/15 1000 05/31/15 0420 05/31/15 0430  WBC 11.1*  --  7.7  HGB 10.3*  --  8.4*  HCT 32.2*  --  27.4*  NA 132* 136  --   K 5.3* 3.9  --   CL 100* 102  --   CO2 22 23  --   BUN 23* 22*  --   CREATININE 1.05* 0.90  --    Liver Panel  Recent Labs  05/30/15 1000  PROT 5.8*  ALBUMIN 2.1*  AST 21  ALT 12*  ALKPHOS 94  BILITOT 0.5   Sedimentation Rate No results for input(s): ESRSEDRATE in the last 72 hours. C-Reactive Protein No results for input(s): CRP in the last 72 hours.  Microbiology: Recent Results (from the past 240 hour(s))  C difficile quick scan w PCR reflex     Status: Abnormal   Collection Time: 05/26/15 11:21 AM  Result Value Ref Range Status   C Diff antigen NEGATIVE NEGATIVE Final   C Diff toxin POSITIVE (A) NEGATIVE Final    Comment: CDIFF PCR WAS RAN AND CONFIRMED POSITIVE CRITICAL RESULT CALLED TO, READ BACK BY AND VERIFIED WITH: R.GREENAWALT,RN 05/26/15 @1729     C Diff interpretation Results are indeterminate. See PCR results.  Final  Clostridium Difficile by PCR     Status: Abnormal   Collection Time: 05/26/15 11:21 AM  Result Value Ref Range Status   Toxigenic C Difficile by pcr POSITIVE (A) NEGATIVE Final    Comment: CRITICAL RESULT CALLED TO, READ BACK BY AND VERIFIED WITH: R.GREENAWALT,RN 05/26/15 @1730  BY V.WILKINS   MRSA PCR Screening     Status: None   Collection Time: 05/27/15  7:06 PM  Result Value Ref Range Status   MRSA by PCR NEGATIVE NEGATIVE Final    Comment:        The GeneXpert MRSA Assay (FDA approved for NASAL specimens only), is one component of a comprehensive MRSA colonization surveillance program. It is not intended to diagnose MRSA infection nor to guide or monitor treatment for MRSA infections.     Studies/Results: Ct Abdomen Pelvis Wo Contrast  05/30/2015  CLINICAL DATA:  82 year old female inpatient with perforated sigmoid diverticulitis, presenting for follow-up. EXAM: CT ABDOMEN  AND PELVIS WITHOUT CONTRAST TECHNIQUE: Multidetector CT imaging of the abdomen and pelvis was performed following the standard protocol without IV contrast. COMPARISON:  05/25/2015 CT abdomen/pelvis. FINDINGS: Partially motion degraded study. Lower chest: New small layering bilateral pleural effusions, right slightly greater than left. Partially visualized pericardial effusions/thickening, at least small in size, unchanged. Two pacemaker leads are seen terminating in the right ventricular apex. Mild dependent atelectasis  at both lung bases. Hepatobiliary: Hypodense 1.1 cm lesion in the lateral segment left liver lobe, poorly visualized due to motion on this scan, seen to represent a simple liver cyst on the 05/02/2015 CT study. Otherwise normal liver. Cholecystectomy. No biliary ductal dilatation. Pancreas: Normal, with no mass or duct dilation. Spleen: Normal size. No mass. Adrenals/Urinary Tract: Normal adrenals. No hydronephrosis. No renal stones. Simple exophytic 2.3 cm renal cyst in the anterior interpolar right kidney. Simple 2.5 cm renal cyst in the posterior interpolar left kidney. Stable exophytic isodense 1.7 cm renal mass in the posterior upper left kidney (series 2/ image 21), unchanged. Hyperdense 1.3 cm interpolar and 0.8 cm anterior upper right renal lesions, unchanged. There is a large volume of gas in the nondependent bladder lumen. There is a tiny amount of layering dense material in the bladder lumen probably representing oral contrast. There is a thick walled fistula between the left anterior superior bladder wall and mid sigmoid colon measuring approximately 3.0 cm in length and 1.7 cm in diameter (series 5/image 45), with gas and oral contrast within the fistulous tract. Stomach/Bowel: Tiny hiatal hernia. Otherwise collapsed and grossly normal stomach. Normal caliber small bowel with no small bowel wall thickening. The appendix is mildly diffusely dilated up to 11 mm diameter with borderline  mild appendiceal wall thickening, unchanged. There is probable minimal oral contrast within the appendiceal lumen. Re- demonstrated is moderate diverticulosis of the sigmoid colon. There is diffuse wall thickening and pericolonic fat stranding throughout the sigmoid colon. In the anterior left pelvis, there is a pericolonic 5.5 x 2.9 cm abscess containing gas and oral contrast (series 2/ image 61), previously 5.2 x 2.4 cm, mildly increased. There is a 3.0 x 2.7 cm thick-walled collection in the sigmoid mesentery (series 2/ image 55) containing a tiny amount oral contrast, mildly decreased from 3.9 x 2.5 cm. Oral contrast reaches the distal rectum. Vascular/Lymphatic: Atherosclerotic nonaneurysmal abdominal aorta. No pathologically enlarged lymph nodes in the abdomen or pelvis. Reproductive: Grossly normal uterus.  No adnexal mass. Other: Small volume ascites is increased. Musculoskeletal: No aggressive appearing focal osseous lesions. Moderate degenerative changes in the visualized thoracolumbar spine. Moderate anasarca is worsened. IMPRESSION: 1. Interval development of colovesical fistula between the sigmoid colon and anterior superior left bladder wall. Large amount of gas in the nondependent bladder. Layering oral contrast in the bladder. 2. Sigmoid mesentery abscess is mildly decreased in size. Left anterior pelvic pericolonic abscess is mildly increased in size. 3. Stable mild diffuse dilatation of the appendix with borderline appendiceal wall thickening, favor reactive change. 4. Small volume ascites is increased. New small layering bilateral pleural effusions. Worsened anasarca. These findings suggest third-spacing. 5. Partially visualized chronic pericardial effusion/thickening, at least small in size, unchanged. 6. Bilateral indeterminate renal lesions for which renal mass protocol MRI or CT abdomen without and with IV contrast is recommended on a short term outpatient basis to exclude renal malignancy. 7.  Tiny hiatal hernia. Electronically Signed   By: Ilona Sorrel M.D.   On: 05/30/2015 14:13     Assessment/Plan: Sigmoid Colectomy Colostomy SB resection Pelvic Abscess drain C diff ARF, CRI- resolved.  Protein calorie malnutrition, severe HTN  Her Cr and WBC have normalized.  She's doing well despite her age and multiple medical issues.  Will watch her stool output.  Will aim to stop diflucan soon.   Total days of antibiotics: 6 vanco IV/PO, zosyn, Diflucan         [name] Infectious Diseases (pager) [phone]  [email or web address] 05/31/2015, 4:24 PM  LOS: 6 days

## 2015-05-31 NOTE — Progress Notes (Signed)
Patient will be transferred to [facility] after discharge from PACU. Called [facility]M and gave report to the nurse who will be taking care of the patient after transfer. Patient belongings were gathered per patient family. CCMD notified of discharge from [facility].   Milford Cage, RN

## 2015-06-01 DIAGNOSIS — K567 Ileus, unspecified: Secondary | ICD-10-CM | POA: Insufficient documentation

## 2015-06-01 DIAGNOSIS — G8918 Other acute postprocedural pain: Secondary | ICD-10-CM | POA: Insufficient documentation

## 2015-06-01 DIAGNOSIS — Z9049 Acquired absence of other specified parts of digestive tract: Secondary | ICD-10-CM | POA: Insufficient documentation

## 2015-06-01 DIAGNOSIS — K9189 Other postprocedural complications and disorders of digestive system: Secondary | ICD-10-CM

## 2015-06-01 LAB — TYPE AND SCREEN
ABO/RH(D): AB POS
ANTIBODY SCREEN: NEGATIVE
Unit division: 0
Unit division: 0

## 2015-06-01 LAB — PROTIME-INR
INR: 1.4 (ref 0.00–1.49)
Prothrombin Time: 17.2 seconds — ABNORMAL HIGH (ref 11.6–15.2)

## 2015-06-01 LAB — CBC
HCT: 32.6 % — ABNORMAL LOW (ref 36.0–46.0)
HEMOGLOBIN: 10.4 g/dL — AB (ref 12.0–15.0)
MCH: 26.1 pg (ref 26.0–34.0)
MCHC: 31.9 g/dL (ref 30.0–36.0)
MCV: 81.9 fL (ref 78.0–100.0)
Platelets: 304 10*3/uL (ref 150–400)
RBC: 3.98 MIL/uL (ref 3.87–5.11)
RDW: 15.6 % — AB (ref 11.5–15.5)
WBC: 11 10*3/uL — AB (ref 4.0–10.5)

## 2015-06-01 LAB — BASIC METABOLIC PANEL
ANION GAP: 9 (ref 5–15)
BUN: 25 mg/dL — ABNORMAL HIGH (ref 6–20)
CALCIUM: 8.3 mg/dL — AB (ref 8.9–10.3)
CO2: 25 mmol/L (ref 22–32)
CREATININE: 0.97 mg/dL (ref 0.44–1.00)
Chloride: 106 mmol/L (ref 101–111)
GFR calc non Af Amer: 50 mL/min — ABNORMAL LOW (ref >60)
GFR, EST AFRICAN AMERICAN: 58 mL/min — AB (ref >60)
Glucose, Bld: 191 mg/dL — ABNORMAL HIGH (ref 65–99)
Potassium: 3.9 mmol/L (ref 3.5–5.1)
SODIUM: 140 mmol/L (ref 135–145)

## 2015-06-01 LAB — GLUCOSE, CAPILLARY
GLUCOSE-CAPILLARY: 189 mg/dL — AB (ref 65–99)
GLUCOSE-CAPILLARY: 183 mg/dL — AB (ref 65–99)
Glucose-Capillary: 180 mg/dL — ABNORMAL HIGH (ref 65–99)
Glucose-Capillary: 185 mg/dL — ABNORMAL HIGH (ref 65–99)
Glucose-Capillary: 167 mg/dL — ABNORMAL HIGH (ref 65–99)

## 2015-06-01 LAB — HEPARIN LEVEL (UNFRACTIONATED)
Heparin Unfractionated: 0.65 IU/mL (ref 0.30–0.70)
Heparin Unfractionated: 0.65 IU/mL (ref 0.30–0.70)

## 2015-06-01 LAB — PHOSPHORUS: PHOSPHORUS: 2.9 mg/dL (ref 2.5–4.6)

## 2015-06-01 LAB — MAGNESIUM: MAGNESIUM: 1.7 mg/dL (ref 1.7–2.4)

## 2015-06-01 MED ORDER — FAT EMULSION 20 % IV EMUL
240.0000 mL | INTRAVENOUS | Status: AC
  Administered 2015-06-01: 240 mL via INTRAVENOUS
  Filled 2015-06-01: qty 250

## 2015-06-01 MED ORDER — MORPHINE SULFATE (PF) 2 MG/ML IV SOLN
2.0000 mg | Freq: Once | INTRAVENOUS | Status: AC
  Administered 2015-06-01: 2 mg via INTRAVENOUS

## 2015-06-01 MED ORDER — TRACE MINERALS CR-CU-MN-SE-ZN 10-1000-500-60 MCG/ML IV SOLN
INTRAVENOUS | Status: DC
  Filled 2015-06-01: qty 1680

## 2015-06-01 MED ORDER — SODIUM CHLORIDE 0.9% FLUSH: 9.0000 mL | INTRAVENOUS | Status: DC | PRN

## 2015-06-01 MED ORDER — DIPHENHYDRAMINE HCL 12.5 MG/5ML PO ELIX: 12.5000 mg | ORAL_SOLUTION | Freq: Four times a day (QID) | ORAL | Status: DC | PRN

## 2015-06-01 MED ORDER — PROCHLORPERAZINE EDISYLATE 5 MG/ML IJ SOLN
10.0000 mg | Freq: Four times a day (QID) | INTRAMUSCULAR | Status: DC | PRN
  Administered 2015-06-01 – 2015-06-07 (×3): 10 mg via INTRAVENOUS
  Filled 2015-06-01 (×5): qty 2

## 2015-06-01 MED ORDER — ONDANSETRON HCL 4 MG/2ML IJ SOLN
4.0000 mg | Freq: Once | INTRAMUSCULAR | Status: AC
  Administered 2015-06-01: 4 mg via INTRAVENOUS

## 2015-06-01 MED ORDER — FAT EMULSION 20 % IV EMUL
240.0000 mL | INTRAVENOUS | Status: DC
  Filled 2015-06-01: qty 250

## 2015-06-01 MED ORDER — MORPHINE SULFATE 2 MG/ML IV SOLN
INTRAVENOUS | Status: DC
  Administered 2015-06-01: 6 mg via INTRAVENOUS
  Administered 2015-06-01: 9 mg via INTRAVENOUS
  Administered 2015-06-01: 14:00:00 via INTRAVENOUS
  Administered 2015-06-02: 4.5 mg via INTRAVENOUS
  Administered 2015-06-02: 6 mg via INTRAVENOUS
  Administered 2015-06-02: 19:00:00 via INTRAVENOUS
  Administered 2015-06-02: 6 mg via INTRAVENOUS
  Administered 2015-06-02: 3 mg via INTRAVENOUS
  Administered 2015-06-02 (×2): 6 mg via INTRAVENOUS
  Administered 2015-06-03: 1.5 mg via INTRAVENOUS
  Administered 2015-06-03: 6 mg via INTRAVENOUS
  Administered 2015-06-03: 3 mg via INTRAVENOUS
  Administered 2015-06-03 (×2): 4.5 mg via INTRAVENOUS
  Administered 2015-06-03 – 2015-06-04 (×2): 1.5 mg via INTRAVENOUS
  Administered 2015-06-04: 3 mg via INTRAVENOUS
  Administered 2015-06-04: 0 mg via INTRAVENOUS
  Filled 2015-06-01 (×2): qty 25

## 2015-06-01 MED ORDER — BETAMETHASONE SOD PHOS & ACET 6 (3-3) MG/ML IJ SUSP
12.0000 mg | Freq: Once | INTRAMUSCULAR | Status: AC
  Administered 2015-06-01: 12 mg via INTRAMUSCULAR
  Filled 2015-06-01: qty 2

## 2015-06-01 MED ORDER — INSULIN ASPART 100 UNIT/ML ~~LOC~~ SOLN
0.0000 [IU] | SUBCUTANEOUS | Status: DC
  Administered 2015-06-01 – 2015-06-02 (×4): 2 [IU] via SUBCUTANEOUS
  Administered 2015-06-02: 1 [IU] via SUBCUTANEOUS

## 2015-06-01 MED ORDER — DIPHENHYDRAMINE HCL 50 MG/ML IJ SOLN: 12.5000 mg | Freq: Four times a day (QID) | INTRAMUSCULAR | Status: DC | PRN

## 2015-06-01 MED ORDER — CETYLPYRIDINIUM CHLORIDE 0.05 % MT LIQD
7.0000 mL | Freq: Two times a day (BID) | OROMUCOSAL | Status: DC
  Administered 2015-06-02 – 2015-06-14 (×22): 7 mL via OROMUCOSAL

## 2015-06-01 MED ORDER — TRACE MINERALS CR-CU-MN-SE-ZN 10-1000-500-60 MCG/ML IV SOLN
INTRAVENOUS | Status: AC
  Administered 2015-06-01: 17:00:00 via INTRAVENOUS
  Filled 2015-06-01: qty 1680

## 2015-06-01 MED ORDER — NALOXONE HCL 0.4 MG/ML IJ SOLN: 0.4000 mg | INTRAMUSCULAR | Status: DC | PRN

## 2015-06-01 MED ORDER — CHLORHEXIDINE GLUCONATE 0.12 % MT SOLN
15.0000 mL | Freq: Two times a day (BID) | OROMUCOSAL | Status: DC
  Administered 2015-06-01 – 2015-06-14 (×24): 15 mL via OROMUCOSAL
  Filled 2015-06-01 (×22): qty 15

## 2015-06-01 MED ORDER — MORPHINE SULFATE (PF) 2 MG/ML IV SOLN
INTRAVENOUS | Status: AC
  Administered 2015-06-01: 2 mg via INTRAVENOUS
  Filled 2015-06-01: qty 1

## 2015-06-01 MED ORDER — CHLORHEXIDINE GLUCONATE 0.12 % MT SOLN: 15.0000 mL | Freq: Two times a day (BID) | OROMUCOSAL | Status: DC

## 2015-06-01 NOTE — Progress Notes (Signed)
PARENTERAL NUTRITION CONSULT NOTE - FOLLOW UP  Pharmacy Consult for TPN Indication: Diverticulitis with perforation  Allergies  Allergen Reactions  . Anti-Inflammatory Enzyme [Nutritional Supplements] Other (See Comments)    Retains fluids and headaches  . Iodinated Diagnostic Agents Hives    HIVES 15MIN S/P IV CONTRAST INJECTION,WILL NEED 13 HR PREP FOR FUTURE INJECTIONS, ok s/p 50mg  po benadryl//a.calhoun  . Spironolactone Other (See Comments)    Hair loss  . Arthrotec [Diclofenac-Misoprostol] Other (See Comments)    unknown  . Biaxin [Clarithromycin] Other (See Comments)    unknown  . Plavix [Clopidogrel Bisulfate] Other (See Comments)    unknown    Patient Measurements: Height: 5\' 5"  (165.1 cm) (05/25/15 @ 1352) Weight: 152 lb (68.947 kg) IBW/kg (Calculated) : 57 Adjusted Body Weight:  Usual Weight:   Vital Signs: Temp: 97.3 F (36.3 C) (05/06 0700) Temp Source: Oral (05/06 0700) BP: 161/75 mmHg (05/06 0700) Pulse Rate: 92 (05/06 0700) Intake/Output from previous day: 05/05 0701 - 05/06 0700 In: 4452.3 [I.V.:1910.2; Blood:670; IV Piggyback:397.5; TPN:1154.7] Out: 2090 [Urine:750; Drains:590; Blood:750] Intake/Output from this shift:    Labs:  Recent Labs  05/30/15 0517 05/30/15 1000 05/31/15 0430 06/01/15 0500  WBC 10.2 11.1* 7.7 11.0*  HGB 9.4* 10.3* 8.4* 10.4*  HCT 30.5* 32.2* 27.4* 32.6*  PLT 368 382 326 304  INR 1.54*  --  1.51*  --      Recent Labs  05/30/15 1000 05/31/15 0420 06/01/15 0500  NA 132* 136 140  K 5.3* 3.9 3.9  CL 100* 102 106  CO2 22 23 25   GLUCOSE 127* 112* 191*  BUN 23* 22* 25*  CREATININE 1.05* 0.90 0.97  CALCIUM 8.2* 8.0* 8.3*  MG 1.9  --  1.7  PHOS 2.8 2.6 2.9  PROT 5.8*  --   --   ALBUMIN 2.1*  --   --   AST 21  --   --   ALT 12*  --   --   ALKPHOS 94  --   --   BILITOT 0.5  --   --    Estimated Creatinine Clearance: 37.6 mL/min (by C-G formula based on Cr of 0.97).    Recent Labs  05/31/15 1705  05/31/15 2324 06/01/15 0813  GLUCAP 219* 221* 189*    Medications:  Scheduled:  . fluconazole (DIFLUCAN) IV  200 mg Intravenous Q24H  . insulin aspart  0-9 Units Subcutaneous TID WC  . lip balm   Topical BID  . metronidazole  500 mg Intravenous Q8H  . piperacillin-tazobactam (ZOSYN)  IV  3.375 g Intravenous Q8H  . sodium chloride flush  10-40 mL Intracatheter Q12H  . sodium phosphate  Dextrose 5% IVPB  20 mmol Intravenous Once  . verapamil  120 mg Oral Daily    Insulin Requirements in the past 24 hours:  5 units sensitive SSI  Assessment: 82 y/o female with several recent admissions for diverticulitis admitted 05/25/2015 with diverticulitis/abscess with microperforation. She has previously been treated conservatively with antibiotics but plan is for colectomy once C diff infection is under control.  Surgeries/Procedures/Scans: 4/29 CT abd - persistent diverticulitis with possible abscess 5/4 CT abd- diverticulitis involving sigmoid colon with abscess that is slightly larger  5/5 S/P sigmoid colectomy and descending colostomy  GI:  LLQ tender, mult loose stools, abd distended.Pelvic abscess drain- 520mL.  Albumin 2.4, prealbumin 4.5<<6.6, LBM 5/1.  Endo: A1c 5.2, CBGs 189-230, elevated post-op but < 150 preop Lytes: K 3.9, Mg 1.7, Phos 2.9, CoCa 9.18.  NaPhos 65mmol ordered 5/5 but not given. Renal: SCr 0.97, CrCl ~40 ml/min, +2.6 L/ 24 hrs (OR yest), NS @ 55 mL/hr Pulm:  [place]-2L Cards:  VSS Hepatobil: LFTs wnl, Tbili 0.8 ID: D#[id number] of Zosyn, metronidazole, fluconazole for diverticulitis; C diff + on vancomycin PO; WBC 11, AFeb. AC/heme:  Heparinwarfarin PTA for Afib, INR 1.51 - warfarin held; Hb 8.4, plt wnl.  Best Practices: SCDs  TPN Access: PICC 5/2 >> TPN start date: 5/2 >>  Nutritional Goals (per RD recommendations):  Kcal: 1500-1700 Protein: 70-80 gm  Current Nutrition:  Clear liquids  Plan:  -Cont Clinimix E 5/15 (WITH LYTES) at 70 mL/hr and IV lipids at  10 mL/hr. This will provide 1672 Kcal and 84 gm of protein which meets 100% of nutritional goals.  -Add MVI and TE to TPN  -Continue sensitive sliding scale -Add NaCl to TPN bag to make it isotonic. Discussed with MD. Monitor sodium levels  - BMet, Phos in AM - Will not add insulin today since CBG improved to < 200 this AM  [name], [name] Clinical Pharmacist [facility]

## 2015-06-01 NOTE — Progress Notes (Signed)
Patient ID: [name], female   DOB: 1923/03/01, 82 y.o.   MRN: [id number]     [facility]      [address] [place], [place] [address]    Phone: [phone] FAX: [phone]     Subjective: Lots of pain. Nausea and vomiting. Vss.  Afebrile.  Objective:  Vital signs:  Filed Vitals:   06/01/15 0400 06/01/15 0500 06/01/15 0600 06/01/15 0700  BP: 124/63 153/125 151/83 161/75  Pulse: 59 61 59 92  Temp:    97.3 F (36.3 C)  TempSrc:    Oral  Resp: _0 Height:      Weight:      SpO2: 95% 96% 94% 96%    Last BM Date: 05/31/15  Intake/Output   Yesterday:  05/05 0701 - 05/06 0700 In: 4452.3 [I.V.:1910.2; Blood:670; IV Piggyback:397.5; TPN:1154.7] Out: 2090 [Urine:750; Drains:590; Blood:750] This shift: I/O last 3 completed shifts: In: 6317.5 [I.V.:2715.3; Blood:670; Other:320; IV Piggyback:497.5] Out: 2090 [Urine:750; Drains:590; Blood:750]    Physical Exam: General: Pt awake/alert/oriented x4 in no acute distress  Abdomen: Soft.  Nondistended.  Generalized tenderness.  ruq drain with serosang output. Dressing is c/d/i. Stoma is pink, no air or stool, sweat is present.  No evidence of peritonitis.  No incarcerated hernias.    Problem List:   Principal Problem:   Diverticulitis of intestine with perforation and abscess Active Problems:   Chronic anticoagulation - Coumadin, CHADS2VASC=7   HTN (hypertension)   Pacemaker   Diabetes ([place])   History of CVA (cerebrovascular accident)   Tachycardia-bradycardia syndrome with symptomatic bradycardia   Chronic atrial fibrillation (HCC)   Diverticulitis   Supratherapeutic INR   CKD (chronic kidney disease) stage 3, GFR 30-59 ml/min   HOH (hard of hearing)   Protein-calorie malnutrition, moderate ([place])   Diverticulitis of large intestine with perforation and abscess without bleeding    Results:   Labs: Results for orders placed or performed during the  hospital encounter of 05/25/15 (from the past 48 hour(s))  Glucose, capillary     Status: Abnormal   Collection Time: 05/30/15 12:53 PM  Result Value Ref Range   Glucose-Capillary 128 (H) 65 - 99 mg/dL  Heparin level (unfractionated)     Status: Abnormal   Collection Time: 05/30/15  1:39 PM  Result Value Ref Range   Heparin Unfractionated 0.27 (L) 0.30 - 0.70 IU/mL    Comment:        IF HEPARIN RESULTS ARE BELOW EXPECTED VALUES, AND PATIENT DOSAGE HAS BEEN CONFIRMED, SUGGEST FOLLOW UP TESTING OF ANTITHROMBIN III LEVELS.   Glucose, capillary     Status: Abnormal   Collection Time: 05/30/15  4:32 PM  Result Value Ref Range   Glucose-Capillary 142 (H) 65 - 99 mg/dL  Heparin level (unfractionated)     Status: None   Collection Time: 05/30/15  8:25 PM  Result Value Ref Range   Heparin Unfractionated 0.32 0.30 - 0.70 IU/mL    Comment:        IF HEPARIN RESULTS ARE BELOW EXPECTED VALUES, AND PATIENT DOSAGE HAS BEEN CONFIRMED, SUGGEST FOLLOW UP TESTING OF ANTITHROMBIN III LEVELS.   Glucose, capillary     Status: Abnormal   Collection Time: 05/30/15  8:52 PM  Result Value Ref Range   Glucose-Capillary 141 (H) 65 - 99 mg/dL  Basic metabolic panel     Status: Abnormal   Collection Time: 05/31/15  4:20 AM  Result Value Ref Range  Sodium 136 135 - 145 mmol/L   Potassium 3.9 3.5 - 5.1 mmol/L    Comment: DELTA CHECK NOTED   Chloride 102 101 - 111 mmol/L   CO2 23 22 - 32 mmol/L   Glucose, Bld 112 (H) 65 - 99 mg/dL   BUN 22 (H) 6 - 20 mg/dL   Creatinine, Ser 0.90 0.44 - 1.00 mg/dL   Calcium 8.0 (L) 8.9 - 10.3 mg/dL   GFR calc non Af Amer 55 (L) >60 mL/min   GFR calc Af Amer >60 >60 mL/min    Comment: (NOTE) The eGFR has been calculated using the CKD EPI equation. This calculation has not been validated in all clinical situations. eGFR's persistently <60 mL/min signify possible Chronic Kidney Disease.    Anion gap 11 5 - 15  Phosphorus     Status: None   Collection Time:  05/31/15  4:20 AM  Result Value Ref Range   Phosphorus 2.6 2.5 - 4.6 mg/dL  Heparin level (unfractionated)     Status: None   Collection Time: 05/31/15  4:30 AM  Result Value Ref Range   Heparin Unfractionated 0.33 0.30 - 0.70 IU/mL    Comment:        IF HEPARIN RESULTS ARE BELOW EXPECTED VALUES, AND PATIENT DOSAGE HAS BEEN CONFIRMED, SUGGEST FOLLOW UP TESTING OF ANTITHROMBIN III LEVELS.   CBC     Status: Abnormal   Collection Time: 05/31/15  4:30 AM  Result Value Ref Range   WBC 7.7 4.0 - 10.5 K/uL   RBC 3.23 (L) 3.87 - 5.11 MIL/uL   Hemoglobin 8.4 (L) 12.0 - 15.0 g/dL   HCT 27.4 (L) 36.0 - 46.0 %   MCV 84.8 78.0 - 100.0 fL   MCH 26.0 26.0 - 34.0 pg   MCHC 30.7 30.0 - 36.0 g/dL   RDW 15.8 (H) 11.5 - 15.5 %   Platelets 326 150 - 400 K/uL  Protime-INR     Status: Abnormal   Collection Time: 05/31/15  4:30 AM  Result Value Ref Range   Prothrombin Time 18.3 (H) 11.6 - 15.2 seconds   INR 1.51 (H) 0.00 - 1.49  Glucose, capillary     Status: Abnormal   Collection Time: 05/31/15  8:34 AM  Result Value Ref Range   Glucose-Capillary 138 (H) 65 - 99 mg/dL  Type and screen [place]     Status: None   Collection Time: 05/31/15 10:15 AM  Result Value Ref Range   ABO/RH(D) AB POS    Antibody Screen NEG    Sample Expiration 06/03/2015    Unit Number [id number]    Blood Component Type RED CELLS,LR    Unit division 00    Status of Unit ISSUED,FINAL    Transfusion Status OK TO TRANSFUSE    Crossmatch Result Compatible    Unit Number [id number]    Blood Component Type RED CELLS,LR    Unit division 00    Status of Unit ISSUED,FINAL    Transfusion Status OK TO TRANSFUSE    Crossmatch Result Compatible   Prepare RBC (crossmatch)     Status: None   Collection Time: 05/31/15 11:29 AM  Result Value Ref Range   Order Confirmation ORDER PROCESSED BY BLOOD BANK   Surgical pcr screen     Status: None   Collection Time: 05/31/15 12:39 PM  Result Value Ref Range    MRSA, PCR NEGATIVE NEGATIVE   Staphylococcus aureus NEGATIVE NEGATIVE    Comment:  The Xpert SA Assay (FDA approved for NASAL specimens in patients over 9 years of age), is one component of a comprehensive surveillance program.  Test performance has been validated by [facility] for patients greater than or equal to 31 year old. It is not intended to diagnose infection nor to guide or monitor treatment.   Glucose, capillary     Status: Abnormal   Collection Time: 05/31/15  1:38 PM  Result Value Ref Range   Glucose-Capillary 230 (H) 65 - 99 mg/dL  Glucose, capillary     Status: Abnormal   Collection Time: 05/31/15  5:05 PM  Result Value Ref Range   Glucose-Capillary 219 (H) 65 - 99 mg/dL  Glucose, capillary     Status: Abnormal   Collection Time: 05/31/15 11:24 PM  Result Value Ref Range   Glucose-Capillary 221 (H) 65 - 99 mg/dL  CBC     Status: Abnormal   Collection Time: 06/01/15  5:00 AM  Result Value Ref Range   WBC 11.0 (H) 4.0 - 10.5 K/uL   RBC 3.98 3.87 - 5.11 MIL/uL   Hemoglobin 10.4 (L) 12.0 - 15.0 g/dL   HCT 32.6 (L) 36.0 - 46.0 %   MCV 81.9 78.0 - 100.0 fL   MCH 26.1 26.0 - 34.0 pg   MCHC 31.9 30.0 - 36.0 g/dL   RDW 15.6 (H) 11.5 - 15.5 %   Platelets 304 150 - 400 K/uL  Basic metabolic panel     Status: Abnormal   Collection Time: 06/01/15  5:00 AM  Result Value Ref Range   Sodium 140 135 - 145 mmol/L   Potassium 3.9 3.5 - 5.1 mmol/L   Chloride 106 101 - 111 mmol/L   CO2 25 22 - 32 mmol/L   Glucose, Bld 191 (H) 65 - 99 mg/dL   BUN 25 (H) 6 - 20 mg/dL   Creatinine, Ser 0.97 0.44 - 1.00 mg/dL   Calcium 8.3 (L) 8.9 - 10.3 mg/dL   GFR calc non Af Amer 50 (L) >60 mL/min   GFR calc Af Amer 58 (L) >60 mL/min    Comment: (NOTE) The eGFR has been calculated using the CKD EPI equation. This calculation has not been validated in all clinical situations. eGFR's persistently <60 mL/min signify possible Chronic Kidney Disease.    Anion gap 9 5 - 15   Phosphorus     Status: None   Collection Time: 06/01/15  5:00 AM  Result Value Ref Range   Phosphorus 2.9 2.5 - 4.6 mg/dL  Magnesium     Status: None   Collection Time: 06/01/15  5:00 AM  Result Value Ref Range   Magnesium 1.7 1.7 - 2.4 mg/dL  Glucose, capillary     Status: Abnormal   Collection Time: 06/01/15  8:13 AM  Result Value Ref Range   Glucose-Capillary 189 (H) 65 - 99 mg/dL    Imaging / Studies: Ct Abdomen Pelvis Wo Contrast  05/30/2015  CLINICAL DATA:  82 year old female inpatient with perforated sigmoid diverticulitis, presenting for follow-up. EXAM: CT ABDOMEN AND PELVIS WITHOUT CONTRAST TECHNIQUE: Multidetector CT imaging of the abdomen and pelvis was performed following the standard protocol without IV contrast. COMPARISON:  05/25/2015 CT abdomen/pelvis. FINDINGS: Partially motion degraded study. Lower chest: New small layering bilateral pleural effusions, right slightly greater than left. Partially visualized pericardial effusions/thickening, at least small in size, unchanged. Two pacemaker leads are seen terminating in the right ventricular apex. Mild dependent atelectasis at both lung bases. Hepatobiliary: Hypodense 1.1 cm lesion  in the lateral segment left liver lobe, poorly visualized due to motion on this scan, seen to represent a simple liver cyst on the 05/02/2015 CT study. Otherwise normal liver. Cholecystectomy. No biliary ductal dilatation. Pancreas: Normal, with no mass or duct dilation. Spleen: Normal size. No mass. Adrenals/Urinary Tract: Normal adrenals. No hydronephrosis. No renal stones. Simple exophytic 2.3 cm renal cyst in the anterior interpolar right kidney. Simple 2.5 cm renal cyst in the posterior interpolar left kidney. Stable exophytic isodense 1.7 cm renal mass in the posterior upper left kidney (series 2/ image 21), unchanged. Hyperdense 1.3 cm interpolar and 0.8 cm anterior upper right renal lesions, unchanged. There is a large volume of gas in the  nondependent bladder lumen. There is a tiny amount of layering dense material in the bladder lumen probably representing oral contrast. There is a thick walled fistula between the left anterior superior bladder wall and mid sigmoid colon measuring approximately 3.0 cm in length and 1.7 cm in diameter (series 5/image 45), with gas and oral contrast within the fistulous tract. Stomach/Bowel: Tiny hiatal hernia. Otherwise collapsed and grossly normal stomach. Normal caliber small bowel with no small bowel wall thickening. The appendix is mildly diffusely dilated up to 11 mm diameter with borderline mild appendiceal wall thickening, unchanged. There is probable minimal oral contrast within the appendiceal lumen. Re- demonstrated is moderate diverticulosis of the sigmoid colon. There is diffuse wall thickening and pericolonic fat stranding throughout the sigmoid colon. In the anterior left pelvis, there is a pericolonic 5.5 x 2.9 cm abscess containing gas and oral contrast (series 2/ image 61), previously 5.2 x 2.4 cm, mildly increased. There is a 3.0 x 2.7 cm thick-walled collection in the sigmoid mesentery (series 2/ image 55) containing a tiny amount oral contrast, mildly decreased from 3.9 x 2.5 cm. Oral contrast reaches the distal rectum. Vascular/Lymphatic: Atherosclerotic nonaneurysmal abdominal aorta. No pathologically enlarged lymph nodes in the abdomen or pelvis. Reproductive: Grossly normal uterus.  No adnexal mass. Other: Small volume ascites is increased. Musculoskeletal: No aggressive appearing focal osseous lesions. Moderate degenerative changes in the visualized thoracolumbar spine. Moderate anasarca is worsened. IMPRESSION: 1. Interval development of colovesical fistula between the sigmoid colon and anterior superior left bladder wall. Large amount of gas in the nondependent bladder. Layering oral contrast in the bladder. 2. Sigmoid mesentery abscess is mildly decreased in size. Left anterior pelvic  pericolonic abscess is mildly increased in size. 3. Stable mild diffuse dilatation of the appendix with borderline appendiceal wall thickening, favor reactive change. 4. Small volume ascites is increased. New small layering bilateral pleural effusions. Worsened anasarca. These findings suggest third-spacing. 5. Partially visualized chronic pericardial effusion/thickening, at least small in size, unchanged. 6. Bilateral indeterminate renal lesions for which renal mass protocol MRI or CT abdomen without and with IV contrast is recommended on a short term outpatient basis to exclude renal malignancy. 7. Tiny hiatal hernia. Electronically Signed   By: Ilona Sorrel M.D.   On: 05/30/2015 14:13    Medications / Allergies:  Scheduled Meds: . betamethasone acetate-betamethasone sodium phosphate  12 mg Intramuscular Once  . fluconazole (DIFLUCAN) IV  200 mg Intravenous Q24H  . insulin aspart  0-9 Units Subcutaneous TID WC  . lip balm   Topical BID  . metronidazole  500 mg Intravenous Q8H  . piperacillin-tazobactam (ZOSYN)  IV  3.375 g Intravenous Q8H  . sodium chloride flush  10-40 mL Intracatheter Q12H  . sodium phosphate  Dextrose 5% IVPB  20 mmol Intravenous Once  .  verapamil  120 mg Oral Daily   Continuous Infusions: . Marland KitchenTPN (CLINIMIX-E) Adult 70 mL/hr at 05/31/15 1801   And  . fat emulsion 240 mL (05/31/15 2000)  . sodium chloride 55 mL/hr at 05/31/15 1951  . Marland KitchenTPN (CLINIMIX-E) Adult     And  . fat emulsion    . heparin 1,350 Units/hr (05/31/15 2257)  . lactated ringers 0 mL/hr at 05/31/15 1159   PRN Meds:.acetaminophen, acetaminophen, alum & mag hydroxide-simeth, benzocaine-Menthol, magic mouthwash, menthol-cetylpyridinium, metoprolol, morphine injection, ondansetron **OR** ondansetron (ZOFRAN) IV, prochlorperazine, silver sulfADIAZINE, sodium chloride flush, sodium chloride flush  Antibiotics: Anti-infectives    Start     Dose/Rate Route Frequency Ordered Stop   05/31/15 2000  metroNIDAZOLE  (FLAGYL) IVPB 500 mg     500 mg 100 mL/hr over 60 Minutes Intravenous Every 8 hours 05/31/15 1852     05/29/15 1200  piperacillin-tazobactam (ZOSYN) IVPB 3.375 g     3.375 g 12.5 mL/hr over 240 Minutes Intravenous Every 8 hours 05/29/15 0943     05/27/15 1200  piperacillin-tazobactam (ZOSYN) IVPB 2.25 g  Status:  Discontinued     2.25 g 100 mL/hr over 30 Minutes Intravenous Every 6 hours 05/27/15 0832 05/29/15 0943   05/27/15 1200  vancomycin (VANCOCIN) 50 mg/mL oral solution 125 mg  Status:  Discontinued     125 mg Oral Every 6 hours 05/27/15 0957 05/31/15 1852   05/27/15 1000  metroNIDAZOLE (FLAGYL) IVPB 500 mg  Status:  Discontinued     500 mg 100 mL/hr over 60 Minutes Intravenous Every 8 hours 05/27/15 0957 05/28/15 1617   05/27/15 0600  cefoTEtan (CEFOTAN) 2 g in dextrose 5 % 50 mL IVPB  Status:  Discontinued     2 g 100 mL/hr over 30 Minutes Intravenous To [facility] 05/26/15 0832 05/27/15 1051   05/26/15 1300  metroNIDAZOLE (FLAGYL) tablet 500 mg  Status:  Discontinued     500 mg Oral 3 times per day on Sun 05/26/15 0928 05/26/15 0931   05/26/15 1300  neomycin (MYCIFRADIN) tablet 1,000 mg     1,000 mg Oral 3 times per day on Sun 05/26/15 0930 05/26/15 2139   05/26/15 1300  metroNIDAZOLE (FLAGYL) tablet 1,000 mg     1,000 mg Oral 3 times per day on Sun 05/26/15 0931 05/26/15 2139   05/26/15 0832  metroNIDAZOLE (FLAGYL) tablet 500 mg  Status:  Discontinued    Comments:  Take 2 pills (=1065m) by mouth at 1pm, 3pm, and 10pm the day before your colorectal operation   500 mg Oral As directed 05/26/15 0832 05/26/15 0925   05/26/15 0832  neomycin (MYCIFRADIN) tablet 500 mg  Status:  Discontinued     500 mg Oral As directed 05/26/15 0500304/30/17 0930   05/26/15 0815  fluconazole (DIFLUCAN) IVPB 200 mg     200 mg 100 mL/hr over 60 Minutes Intravenous Every 24 hours 05/26/15 0811     05/26/15 0600  piperacillin-tazobactam (ZOSYN) IVPB 3.375 g  Status:  Discontinued     3.375  g 12.5 mL/hr over 240 Minutes Intravenous Every 8 hours 05/25/15 2155 05/27/15 0832   05/25/15 1900  piperacillin-tazobactam (ZOSYN) IVPB 3.375 g     3.375 g 100 mL/hr over 30 Minutes Intravenous  Once 05/25/15 1851 05/25/15 2002        Assessment/Plan Perforated sigmoid diverticulitis with pelvic abscess and possible colovesical fistula POD#1 Sigmoid colectomy, descending colostomy, small bowel resection, placement of pelvic abscess drain--Dr. TGeorgette Dover-insert NGT, continue bowel rest, start  PCA, watch for overmedication and sedation -woc consult for new ostomy -mobilize, pt eval -leave foley in place for 7 days post op  Afib-heparin gtt, watch for bleeding with ngt insertion  VTE prophylaxis-SCD/heparin gtt PCM-TPN ID-c diff, atbx per ID Dispo-ICU   Erby Pian, ANP-BC [facility]   06/01/2015 11:01 AM

## 2015-06-01 NOTE — Progress Notes (Signed)
ANTICOAGULATION & ANTIBIOTIC CONSULT NOTE - Follow Up Consult  Pharmacy Consult for Heparin Indication: atrial fibrillation  Allergies  Allergen Reactions  . Anti-Inflammatory Enzyme [Nutritional Supplements] Other (See Comments)    Retains fluids and headaches  . Iodinated Diagnostic Agents Hives    HIVES 15MIN S/P IV CONTRAST INJECTION,WILL NEED 13 HR PREP FOR FUTURE INJECTIONS, ok s/p 50mg  po benadryl//a.calhoun  . Spironolactone Other (See Comments)    Hair loss  . Arthrotec [Diclofenac-Misoprostol] Other (See Comments)    unknown  . Biaxin [Clarithromycin] Other (See Comments)    unknown  . Plavix [Clopidogrel Bisulfate] Other (See Comments)    unknown    Patient Measurements: Height: 5\' 5"  (165.1 cm) (05/25/15 @ 1352) Weight: 152 lb (68.947 kg) IBW/kg (Calculated) : 57 Heparin Dosing Weight: 68 kg  Vital Signs: Temp: 97.3 F (36.3 C) (05/06 0700) Temp Source: Oral (05/06 0700) BP: 137/67 mmHg (05/06 1300) Pulse Rate: 99 (05/06 1300)  Labs:  Recent Labs  05/30/15 0517  05/30/15 1000  05/30/15 2025 05/31/15 0420 05/31/15 0430 06/01/15 0500 06/01/15 1115 06/01/15 1117  HGB 9.4*  --  10.3*  --   --   --  8.4* 10.4*  --   --   HCT 30.5*  --  32.2*  --   --   --  27.4* 32.6*  --   --   PLT 368  --  382  --   --   --  326 304  --   --   LABPROT 18.6*  --   --   --   --   --  18.3*  --   --  17.2*  INR 1.54*  --   --   --   --   --  1.51*  --   --  1.40  HEPARINUNFRC  --   < >  --   < > 0.32  --  0.33  --  0.65  --   CREATININE  --   --  1.05*  --   --  0.90  --  0.97  --   --   < > = values in this interval not displayed.  Estimated Creatinine Clearance: 37.6 mL/min (by C-G formula based on Cr of 0.97).   Medications:  Scheduled:  . fluconazole (DIFLUCAN) IV  200 mg Intravenous Q24H  . insulin aspart  0-9 Units Subcutaneous TID WC  . lip balm   Topical BID  . metronidazole  500 mg Intravenous Q8H  . morphine   Intravenous Q4H  . piperacillin-tazobactam  (ZOSYN)  IV  3.375 g Intravenous Q8H  . sodium chloride flush  10-40 mL Intracatheter Q12H  . sodium phosphate  Dextrose 5% IVPB  20 mmol Intravenous Once  . verapamil  120 mg Oral Daily    Assessment: 89 YOF on warfarin PTA for hx Afib - transitioned to heparin for planned colectomy - done on 5/5 and heparin resumed post-op. The patient also continues on Zosyn + Fluconazole for diverticulitis with abscess and Flagyl IV for CDiff +.   A heparin level this morning resulted as therapeutic (HL 0.65) however will reduce the rate slightly to target the lower end of the goal range given the patient's recent surgery.   Renal function remains stable and antibiotic doses are appropriate at this time.   Goal of Therapy:  Heparin level 0.3-0.7 units/ml Monitor platelets by anticoagulation protocol: Yes  Proper antibiotics for infection/cultures adjusted for renal/hepatic function    Plan:  1. Reduce  Heparin drip rate to 1250 units/hr (12.5 ml/hr) 2. Continue Zosyn 3.375g IV every 8 hours EI 3. Continue Fluconazole 200 mg IV every 24 hours 4. Will continue to monitor for any signs/symptoms of bleeding and will follow up with heparin level in 8 hours  5. Will continue to follow renal function, culture results, LOT, and antibiotic de-escalation plans   Alycia Rossetti, [name], BCPS Clinical Pharmacist Pager: [phone] 06/01/2015 1:55 PM

## 2015-06-01 NOTE — Progress Notes (Addendum)
ANTICOAGULATION CONSULT NOTE - Follow Up Consult  Pharmacy Consult for Heparin Indication: atrial fibrillation  Allergies  Allergen Reactions  . Anti-Inflammatory Enzyme [Nutritional Supplements] Other (See Comments)    Retains fluids and headaches  . Iodinated Diagnostic Agents Hives    HIVES 15MIN S/P IV CONTRAST INJECTION,WILL NEED 13 HR PREP FOR FUTURE INJECTIONS, ok s/p 50mg  po benadryl//a.calhoun  . Spironolactone Other (See Comments)    Hair loss  . Arthrotec [Diclofenac-Misoprostol] Other (See Comments)    unknown  . Biaxin [Clarithromycin] Other (See Comments)    unknown  . Plavix [Clopidogrel Bisulfate] Other (See Comments)    unknown    Patient Measurements: Height: 5\' 5"  (165.1 cm) (05/25/15 @ 1352) Weight: 152 lb (68.947 kg) IBW/kg (Calculated) : 57 Heparin Dosing Weight: 68 kg  Vital Signs: Temp: 97.3 F (36.3 C) (05/06 2000) Temp Source: Axillary (05/06 2000) BP: 130/86 mmHg (05/06 2300) Pulse Rate: 95 (05/06 2300)  Labs:  Recent Labs  05/30/15 0517  05/30/15 1000  05/31/15 0420 05/31/15 0430 06/01/15 0500 06/01/15 1115 06/01/15 1117 06/01/15 2230  HGB 9.4*  --  10.3*  --   --  8.4* 10.4*  --   --   --   HCT 30.5*  --  32.2*  --   --  27.4* 32.6*  --   --   --   PLT 368  --  382  --   --  326 304  --   --   --   LABPROT 18.6*  --   --   --   --  18.3*  --   --  17.2*  --   INR 1.54*  --   --   --   --  1.51*  --   --  1.40  --   HEPARINUNFRC  --   < >  --   < >  --  0.33  --  0.65  --  0.65  CREATININE  --   --  1.05*  --  0.90  --  0.97  --   --   --   < > = values in this interval not displayed.  Estimated Creatinine Clearance: 37.6 mL/min (by C-G formula based on Cr of 0.97).   Medications:  Scheduled:  . [START ON 06/02/2015] antiseptic oral rinse  7 mL Mouth Rinse q12n4p  . chlorhexidine  15 mL Mouth Rinse BID  . fluconazole (DIFLUCAN) IV  200 mg Intravenous Q24H  . insulin aspart  0-9 Units Subcutaneous Q4H  . lip balm   Topical BID   . metronidazole  500 mg Intravenous Q8H  . morphine   Intravenous Q4H  . piperacillin-tazobactam (ZOSYN)  IV  3.375 g Intravenous Q8H  . sodium chloride flush  10-40 mL Intracatheter Q12H  . sodium phosphate  Dextrose 5% IVPB  20 mmol Intravenous Once  . verapamil  120 mg Oral Daily    Assessment: 89 YOF on warfarin PTA for hx Afib - transitioned to heparin for planned colectomy - done on 5/5 and heparin resumed post-op. The patient also continues on Zosyn + Fluconazole for diverticulitis with abscess and Flagyl IV for CDiff +.   A heparin level this morning resulted as therapeutic (HL 0.65) and rate was reduced to 1250 units/hr due to pt recent hx of sx and prefer HL between 0.3-0.5; subsequent level drawn this am that is reflective of heparin decreased rate remained elevated at 0.65 (goal 0.3-0.5).   Goal of Therapy:  Heparin level 0.3-0.7  units/ml Monitor platelets by anticoagulation protocol: Yes    Plan:  1. Reduce Heparin drip rate to 1050 units/hr (10.5 ml/hr) 2. Will continue to monitor for any signs/symptoms of bleeding and will follow up with heparin level in am 3. Daily HL and CBC    Vincenza Hews, [name], BCPS 06/01/2015, 11:16 PM Pager: [phone]

## 2015-06-01 NOTE — Progress Notes (Signed)
Family Medicine Teaching Service Daily Progress Note Intern Pager: [phone]  Patient name: [name] Medical record number: [id number] Date of birth: 04-Nov-1923 Age: 82 y.o. Gender: female  Primary Care Provider: [name], MD Consultants: ID, surgery  Code Status: Full   Pt Overview and Major Events to Date:  4/28: Readmit for Diverticulitis with possible perforation / abscess 5/5: Hartmann's procedure  Assessment and Plan: EVEY MCFADYEN is a 82 y.o. female presenting with LLQ abdominal pain. PMH is significant for atrial fibrillation, hx of CVA, hypertension, type 2 diabetes, tachycardia-bradycardia syndrome s/p pacemaker, prior pericardial effusion s/p pericardial window 12/16, sleep apnea (no CPAP), stroke (right frontal 2013), GI bleeding with hemorrhoids, and arthritis and recurrent diverticulitis.  # Recurrent diverticulitis with possible perforation / abscess: Repeat CT Interval development of colovesical fistula between the sigmoid colon and anterior superior left bladder wall. Sigmoid mesentery abscess is mildly decreased in size. Left anterior pelvic pericolonic abscess is mildly increased in size.  Bilateral indeterminate renal lesions requiring further imaging.  WBC 27.5 > 24.7> 17.2>>7.7>11.0 Patient with positive C. Diff  - Antibiotics: 4/29 Zosyn >> ; 4/30 Fluc >> - Pain control: Morphine 1-2 mg q3hrs prn - Renal mass requiring further imaging  - Surgery recs: s/p Hartmann 5/5.  Currently in the SICU.  Will await recs for dispo.  Would also appreciate recs on duration of abx in the setting of recent sx and TPN duration. - c/s PT/OT - c/s wound care for ostomy   # C.difficule positive : Continues to have bowel movements, no symptomatic changes  - Continue PO vancomycin 150 mg QID (5/1 PO vancomycin>>) - ID consulted, appreciate recs  - Contact precautions   # Chronic Atrial fibrillation: Ventricular Pacemaker for AF. INR supra-therapeutic on 4/26. INR  1.57>1.72>1.54>1.51 - Continue Verapamil and Digoxin  - D/C warfarin - Heparin per pharmacy - INR needs to be less than 1.5 post surgery, PT/INR draw pending - Consider adding back on Torsemide 20 mg, as patient with low extremity edema and kidney function improved   # DM2: Diet contorlled. A1c 6.2 (12/2014)> 6.3  - monitor cbgs; SSI sensitive   # Oral Thrush - Continue Fluconazole   # Anemia: Hgb 10.4 on admit (baseline 11). History of diverticulitis and hemorrhoids - Hgb 9.7>>10.4  FEN/GI: Clear Liquid diet; NS@ 55 ml, TPN  Prophylaxis: Heparin   Disposition: Step-down   Subjective:  Patient reports that pain is well controlled.  She notes that she feels like she has something stuck in her throat that is making her gag intermittently but she denies nausea.  No other concerns this am except when she is going home.  Objective: Temp:  [97.9 F (36.6 C)-98.4 F (36.9 C)] 98.4 F (36.9 C) (05/06 0329) Pulse Rate:  [54-92] 92 (05/06 0700) Resp:  [16-38] 27 (05/06 0700) BP: (84-181)/(59-125) 161/75 mmHg (05/06 0700) SpO2:  [93 %-98 %] 96 % (05/06 0700) Physical Exam: General: NAD, lying in bed, family at bedside Cardiovascular: regular rate and rhythm with 2/6 murmur, paced rhythm  Respiratory: CTAB, normal WOB, 2+ radial pulses  Abdomen: Soft, colostomy bag in place with scant old blood in bag, minimal saturation of wound dressing. Ext: +1 LE edema bilaterally Neuro: rouses easily, speech normal, AO  Laboratory:  Recent Labs Lab 05/30/15 1000 05/31/15 0430 06/01/15 0500  WBC 11.1* 7.7 11.0*  HGB 10.3* 8.4* 10.4*  HCT 32.2* 27.4* 32.6*  PLT 382 326 304    Recent Labs Lab 05/25/15 1639  05/28/15  [id number]  05/30/15 1000 05/31/15 0420 06/01/15 0500  NA 133*  < > 130*  < > 132* 136 140  K 3.0*  < > 4.5  < > 5.3* 3.9 3.9  CL 84*  < > 91*  < > 100* 102 106  CO2 35*  < > 29  < > 22 23 25   BUN 37*  < > 37*  < > 23* 22* 25*  CREATININE 1.44*  < > 1.72*  < >  1.05* 0.90 0.97  CALCIUM 8.8*  < > 8.3*  < > 8.2* 8.0* 8.3*  PROT 6.4*  --  5.5*  --  5.8*  --   --   BILITOT 0.8  --  0.8  --  0.5  --   --   ALKPHOS 109  --  82  --  94  --   --   ALT 21  --  15  --  12*  --   --   AST 27  --  17  --  21  --   --   GLUCOSE 111*  < > 89  < > 127* 112* 191*  < > = values in this interval not displayed.  Imaging/Diagnostic Tests: Ct Abdomen Pelvis Wo Contrast  05/30/2015  CLINICAL DATA:  82 year old female inpatient with perforated sigmoid diverticulitis, presenting for follow-up. EXAM: CT ABDOMEN AND PELVIS WITHOUT CONTRAST TECHNIQUE: Multidetector CT imaging of the abdomen and pelvis was performed following the standard protocol without IV contrast. COMPARISON:  05/25/2015 CT abdomen/pelvis. FINDINGS: Partially motion degraded study. Lower chest: New small layering bilateral pleural effusions, right slightly greater than left. Partially visualized pericardial effusions/thickening, at least small in size, unchanged. Two pacemaker leads are seen terminating in the right ventricular apex. Mild dependent atelectasis at both lung bases. Hepatobiliary: Hypodense 1.1 cm lesion in the lateral segment left liver lobe, poorly visualized due to motion on this scan, seen to represent a simple liver cyst on the 05/02/2015 CT study. Otherwise normal liver. Cholecystectomy. No biliary ductal dilatation. Pancreas: Normal, with no mass or duct dilation. Spleen: Normal size. No mass. Adrenals/Urinary Tract: Normal adrenals. No hydronephrosis. No renal stones. Simple exophytic 2.3 cm renal cyst in the anterior interpolar right kidney. Simple 2.5 cm renal cyst in the posterior interpolar left kidney. Stable exophytic isodense 1.7 cm renal mass in the posterior upper left kidney (series 2/ image 21), unchanged. Hyperdense 1.3 cm interpolar and 0.8 cm anterior upper right renal lesions, unchanged. There is a large volume of gas in the nondependent bladder lumen. There is a tiny amount of  layering dense material in the bladder lumen probably representing oral contrast. There is a thick walled fistula between the left anterior superior bladder wall and mid sigmoid colon measuring approximately 3.0 cm in length and 1.7 cm in diameter (series 5/image 45), with gas and oral contrast within the fistulous tract. Stomach/Bowel: Tiny hiatal hernia. Otherwise collapsed and grossly normal stomach. Normal caliber small bowel with no small bowel wall thickening. The appendix is mildly diffusely dilated up to 11 mm diameter with borderline mild appendiceal wall thickening, unchanged. There is probable minimal oral contrast within the appendiceal lumen. Re- demonstrated is moderate diverticulosis of the sigmoid colon. There is diffuse wall thickening and pericolonic fat stranding throughout the sigmoid colon. In the anterior left pelvis, there is a pericolonic 5.5 x 2.9 cm abscess containing gas and oral contrast (series 2/ image 61), previously 5.2 x 2.4 cm, mildly increased. There is a 3.0 x 2.7  cm thick-walled collection in the sigmoid mesentery (series 2/ image 55) containing a tiny amount oral contrast, mildly decreased from 3.9 x 2.5 cm. Oral contrast reaches the distal rectum. Vascular/Lymphatic: Atherosclerotic nonaneurysmal abdominal aorta. No pathologically enlarged lymph nodes in the abdomen or pelvis. Reproductive: Grossly normal uterus.  No adnexal mass. Other: Small volume ascites is increased. Musculoskeletal: No aggressive appearing focal osseous lesions. Moderate degenerative changes in the visualized thoracolumbar spine. Moderate anasarca is worsened. IMPRESSION: 1. Interval development of colovesical fistula between the sigmoid colon and anterior superior left bladder wall. Large amount of gas in the nondependent bladder. Layering oral contrast in the bladder. 2. Sigmoid mesentery abscess is mildly decreased in size. Left anterior pelvic pericolonic abscess is mildly increased in size. 3. Stable  mild diffuse dilatation of the appendix with borderline appendiceal wall thickening, favor reactive change. 4. Small volume ascites is increased. New small layering bilateral pleural effusions. Worsened anasarca. These findings suggest third-spacing. 5. Partially visualized chronic pericardial effusion/thickening, at least small in size, unchanged. 6. Bilateral indeterminate renal lesions for which renal mass protocol MRI or CT abdomen without and with IV contrast is recommended on a short term outpatient basis to exclude renal malignancy. 7. Tiny hiatal hernia. Electronically Signed   By: Ilona Sorrel M.D.   On: 05/30/2015 14:13    Janora Norlander, DO 06/01/2015, 8:26 AM PGY-2, [place] Intern pager: [phone], text pages welcome

## 2015-06-02 LAB — CBC
HCT: 30.7 % — ABNORMAL LOW (ref 36.0–46.0)
Hemoglobin: 9.6 g/dL — ABNORMAL LOW (ref 12.0–15.0)
MCH: 26.4 pg (ref 26.0–34.0)
MCHC: 31.3 g/dL (ref 30.0–36.0)
MCV: 84.6 fL (ref 78.0–100.0)
Platelets: 286 10*3/uL (ref 150–400)
RBC: 3.63 MIL/uL — ABNORMAL LOW (ref 3.87–5.11)
RDW: 16.2 % — AB (ref 11.5–15.5)
WBC: 11.5 10*3/uL — ABNORMAL HIGH (ref 4.0–10.5)

## 2015-06-02 LAB — GLUCOSE, CAPILLARY
GLUCOSE-CAPILLARY: 167 mg/dL — AB (ref 65–99)
GLUCOSE-CAPILLARY: 167 mg/dL — AB (ref 65–99)
GLUCOSE-CAPILLARY: 139 mg/dL — AB (ref 65–99)
Glucose-Capillary: 150 mg/dL — ABNORMAL HIGH (ref 65–99)
Glucose-Capillary: 160 mg/dL — ABNORMAL HIGH (ref 65–99)

## 2015-06-02 LAB — BASIC METABOLIC PANEL
ANION GAP: 9 (ref 5–15)
BUN: 36 mg/dL — ABNORMAL HIGH (ref 6–20)
CALCIUM: 8.8 mg/dL — AB (ref 8.9–10.3)
CHLORIDE: 112 mmol/L — AB (ref 101–111)
CO2: 24 mmol/L (ref 22–32)
CREATININE: 1.1 mg/dL — AB (ref 0.44–1.00)
GFR calc Af Amer: 50 mL/min — ABNORMAL LOW (ref >60)
GFR calc non Af Amer: 43 mL/min — ABNORMAL LOW (ref >60)
Glucose, Bld: 194 mg/dL — ABNORMAL HIGH (ref 65–99)
Potassium: 4.2 mmol/L (ref 3.5–5.1)
SODIUM: 145 mmol/L (ref 135–145)

## 2015-06-02 LAB — HEPARIN LEVEL (UNFRACTIONATED)
Heparin Unfractionated: 0.7 IU/mL (ref 0.30–0.70)
Heparin Unfractionated: 0.45 IU/mL (ref 0.30–0.70)

## 2015-06-02 MED ORDER — TRACE MINERALS CR-CU-MN-SE-ZN 10-1000-500-60 MCG/ML IV SOLN
INTRAVENOUS | Status: AC
  Administered 2015-06-02: 19:00:00 via INTRAVENOUS
  Filled 2015-06-02: qty 1680

## 2015-06-02 MED ORDER — FUROSEMIDE 10 MG/ML IJ SOLN
40.0000 mg | Freq: Two times a day (BID) | INTRAMUSCULAR | Status: AC
  Administered 2015-06-02 (×2): 40 mg via INTRAVENOUS
  Filled 2015-06-02 (×2): qty 4

## 2015-06-02 MED ORDER — FAT EMULSION 20 % IV EMUL
240.0000 mL | INTRAVENOUS | Status: AC
  Administered 2015-06-02: 240 mL via INTRAVENOUS
  Filled 2015-06-02: qty 250

## 2015-06-02 MED ORDER — PANTOPRAZOLE SODIUM 40 MG IV SOLR
40.0000 mg | Freq: Every day | INTRAVENOUS | Status: DC
Start: 2015-06-02 — End: 2015-06-06
  Administered 2015-06-02 – 2015-06-06 (×5): 40 mg via INTRAVENOUS
  Filled 2015-06-02 (×5): qty 40

## 2015-06-02 MED ORDER — INSULIN ASPART 100 UNIT/ML ~~LOC~~ SOLN
0.0000 [IU] | SUBCUTANEOUS | Status: DC
  Administered 2015-06-02 (×2): 3 [IU] via SUBCUTANEOUS
  Administered 2015-06-02 – 2015-06-07 (×20): 2 [IU] via SUBCUTANEOUS
  Administered 2015-06-07: 3 [IU] via SUBCUTANEOUS
  Administered 2015-06-08 (×4): 2 [IU] via SUBCUTANEOUS
  Administered 2015-06-09: 3 [IU] via SUBCUTANEOUS
  Administered 2015-06-09: 2 [IU] via SUBCUTANEOUS
  Administered 2015-06-09: 3 [IU] via SUBCUTANEOUS
  Administered 2015-06-09 – 2015-06-13 (×10): 2 [IU] via SUBCUTANEOUS
  Administered 2015-06-13: 3 [IU] via SUBCUTANEOUS
  Administered 2015-06-14: 2 [IU] via SUBCUTANEOUS
  Administered 2015-06-14: 3 [IU] via SUBCUTANEOUS

## 2015-06-02 NOTE — Evaluation (Signed)
Physical Therapy Evaluation Patient Details Name: [name] MRN: [id number] DOB: [birth year] Today's Date: 06/02/2015   History of Present Illness  19 F with known a fib/tachy-brady syndrome s/p pacemaker and other multiple comorbidities who has had several recurrent admission for diverticulitis and presents now with the same. States pain has never really abated since last discharge. Also with profound weakness and fatigue; Diverticulitis with possible perforation / abscess; s/p Sigmoid colectomy, descending colostomy, small bowel resection, placement of pelvic abscess drain  Clinical Impression   Pt admitted with above diagnosis. Pt currently with functional limitations due to the deficits listed below (see PT Problem List).  Pt will benefit from skilled PT to increase their independence and safety with mobility to allow discharge to the venue listed below.       Follow Up Recommendations CIR    Equipment Recommendations  None recommended by PT    Recommendations for Other Services Rehab consult     Precautions / Restrictions Precautions Precautions: Fall Precaution Comments: multiple lines and NG tube      Mobility  Bed Mobility Overal bed mobility: Needs Assistance Bed Mobility: Sit to Supine       Sit to supine: +2 for safety/equipment;Mod assist   General bed mobility comments: mod assist to lift LEs into bed; PT, tech, and RN present and helpful to untangle multiple lines and leads  Transfers Overall transfer level: Needs assistance Equipment used: Rolling walker (2 wheeled) Transfers: Sit to/from Stand Sit to Stand: Mod assist         General transfer comment: Cues for hand placement and safety; Pt does not want power up assist at axillae; gave support at elbow to encourage shoulder girdle stabilizing and use  Ambulation/Gait Ambulation/Gait assistance: Min assist Ambulation Distance (Feet): 3 Feet (pivot and sidesteps back t bed) Assistive device:  Rolling walker (2 wheeled) Gait Pattern/deviations: Shuffle     General Gait Details: modest instability, dependence on RW for safety at this time  Stairs            Wheelchair Mobility    Modified Rankin (Stroke Patients Only)       Balance Overall balance assessment: Needs assistance   Sitting balance-Leahy Scale: Good Sitting balance - Comments: Very nice ability to scoot hips back toward center of bed while sitting EOB     Standing balance-Leahy Scale: Poor                               Pertinent Vitals/Pain Pain Assessment: Faces Faces Pain Scale: Hurts little more Pain Location: Grimace with moving around Pain Descriptors / Indicators: Aching;Grimacing;Guarding Pain Intervention(s): PCA encouraged;Repositioned    Home Living Family/patient expects to be discharged to:: Private residence Living Arrangements: Alone Available Help at Discharge: Family;Available PRN/intermittently Type of Home: House Home Access: Stairs to enter Entrance Stairs-Rails: Left Entrance Stairs-Number of Steps: 2 Home Layout: Two level Home Equipment: Walker - 2 wheels      Prior Function Level of Independence: Independent               Hand Dominance   Dominant Hand: Right    Extremity/Trunk Assessment   Upper Extremity Assessment: Generalized weakness           Lower Extremity Assessment: Generalized weakness      Cervical / Trunk Assessment: Kyphotic  Communication   Communication: HOH  Cognition Arousal/Alertness: Awake/alert Behavior During Therapy: WFL for tasks assessed/performed Overall Cognitive Status:  Impaired/Different from baseline Area of Impairment: Awareness           Awareness: Anticipatory        General Comments      Exercises        Assessment/Plan    PT Assessment Patient needs continued PT services  PT Diagnosis Difficulty walking;Generalized weakness;Acute pain   PT Problem List Decreased  strength;Decreased activity tolerance;Decreased balance;Decreased mobility;Decreased knowledge of use of DME;Decreased safety awareness;Pain;Decreased knowledge of precautions  PT Treatment Interventions DME instruction;Gait training;Stair training;Functional mobility training;Therapeutic activities;Therapeutic exercise;Balance training;Patient/family education   PT Goals (Current goals can be found in the Care Plan section) Acute Rehab PT Goals Patient Stated Goal: Return to her home PT Goal Formulation: With patient Time For Goal Achievement: 06/16/15 Potential to Achieve Goals: Good    Frequency Min 3X/week   Barriers to discharge        Co-evaluation               End of Session   Activity Tolerance: Patient tolerated treatment well Patient left: in bed;with call bell/phone within reach;with bed alarm set Nurse Communication: Mobility status         Time: [id number] PT Time Calculation (min) (ACUTE ONLY): 26 min   Charges:   PT Evaluation $PT Eval Moderate Complexity: 1 Procedure PT Treatments $Gait Training: 8-22 mins   PT G Codes:        [name] 06/02/2015, 2:57 PM  [name], [place] Pager [phone] Office [phone]

## 2015-06-02 NOTE — Progress Notes (Signed)
Inpatient Rehabilitation  Per PT request patient was screened by Gunnar Fusi for appropriateness for an Inpatient Acute Rehab consult.  At this time we are recommending an Inpatient Rehab consult.  Text paged intern to request order.  Please order if you are agreeable.    Carmelia Roller., CCC/SLP Admission Coordinator  [place]  Cell [phone]

## 2015-06-02 NOTE — Progress Notes (Signed)
Family Medicine Teaching Service Daily Progress Note Intern Pager: [phone]  Patient name: [name] Medical record number: [id number] Date of birth: 12/21/1923 Age: 82 y.o. Gender: female  Primary Care Provider: [name], MD Consultants: ID, surgery  Code Status: Full   Pt Overview and Major Events to Date:  4/28: Readmit for Diverticulitis with possible perforation / abscess 5/5: Hartmann's procedure  Assessment and Plan: [name] is a 82 y.o. female presenting with LLQ abdominal pain. PMH is significant for atrial fibrillation, hx of CVA, hypertension, type 2 diabetes, tachycardia-bradycardia syndrome s/p pacemaker, prior pericardial effusion s/p pericardial window 12/16, sleep apnea (no CPAP), stroke (right frontal 2013), GI bleeding with hemorrhoids, and arthritis and recurrent diverticulitis.  # Recurrent diverticulitis with possible perforation / abscess: Repeat CT Interval development of colovesical fistula between the sigmoid colon and anterior superior left bladder wall. Sigmoid mesentery abscess is mildly decreased in size. Left anterior pelvic pericolonic abscess is mildly increased in size.  Bilateral indeterminate renal lesions requiring further imaging.  WBC 27.5 > 24.7> 17.2>>7.7>11.5 Patient with positive C. Diff  - Antibiotics: 4/29 Zosyn >> ; 4/30 Fluc >> - Pain control: PCA pump per surgery - Renal mass requiring further imaging  - Surgery recs: s/p Hartmann 5/5.  Currently in the SICU, to remain in SICU overnight.  Will await recs for dispo.  Would also appreciate recs on duration of abx in the setting of recent sx and TPN duration. - c/s PT/OT - c/s wound care for ostomy    # LE edema/ decreased UOP: has had about 785 cc out.  Net + 18L since admission. On Demadex 80mg  daily at home - will order IV lasix 40 BID x2 doses.  Will likely need to increase this but since she is post surgery will err on the conservative side. - BMP in am - SLIV -  Strict I/Os  # C.difficule positive: Ostomy in place.  Having stool output. - PO vancomycin 150 mg QID (5/1 > 5/5).  Switched to IV Flagyl, as patient is NPO secondary to ileus - ID consulted, appreciate recs  - Contact precautions   # Chronic Atrial fibrillation: Ventricular Pacemaker for AF. INR supra-therapeutic on 4/26. INR 1.57>>1.51 - Continue Verapamil and Digoxin  - D/C warfarin - Heparin per pharmacy - INR needs to be less than 1.5 post surgery, PT/INR in am - Consider adding back on Torsemide 20 mg, as patient with low extremity edema and kidney function improved   # DM2: Diet controlled. A1c 6.2 (12/2014)> 6.3  - monitor cbgs; SSI sensitive   # Oral Thrush - Continue Fluconazole   # Anemia: Hgb 10.4 on admit (baseline 11). History of diverticulitis and hemorrhoids -Hgb 9.6 this am - am CBC  FEN/GI: NPO; SLIV, TPN  Prophylaxis: Heparin gtt  Disposition: SICU  Subjective:  Patient reports that pain is much better controlled w/ PCA.  She acknowledges LE edema.  She reports that she takes Demadex at home.  Objective: Temp:  [96.5 F (35.8 C)-97.4 F (36.3 C)] 97.4 F (36.3 C) (05/07 0400) Pulse Rate:  [92-101] 93 (05/07 0500) Resp:  [8-30] 8 (05/07 0500) BP: (102-161)/(57-127) 153/94 mmHg (05/07 0500) SpO2:  [93 %-98 %] 98 % (05/07 0500) Physical Exam: General: NAD, lying in bed HEENT: NGT in place draining dark fluid Cardiovascular: regular rate and rhythm with 2/6 murmur, paced rhythm  Respiratory: fine bibasilar crackles, normal WOB, 2+ radial pulses  Abdomen: Soft, colostomy bag in place with brown stool in  bag, minimal saturation of wound dressing, drain in place. Ext: +2-3 LE edema bilaterally Neuro: rouses easily, speech normal, AO  Laboratory:  Recent Labs Lab 05/31/15 0430 06/01/15 0500 06/02/15 0520  WBC 7.7 11.0* 11.5*  HGB 8.4* 10.4* 9.6*  HCT 27.4* 32.6* 30.7*  PLT 326 304 286    Recent Labs Lab 05/28/15 0954   05/30/15 1000 05/31/15 0420 06/01/15 0500  NA 130*  < > 132* 136 140  K 4.5  < > 5.3* 3.9 3.9  CL 91*  < > 100* 102 106  CO2 29  < > 22 23 25   BUN 37*  < > 23* 22* 25*  CREATININE 1.72*  < > 1.05* 0.90 0.97  CALCIUM 8.3*  < > 8.2* 8.0* 8.3*  PROT 5.5*  --  5.8*  --   --   BILITOT 0.8  --  0.5  --   --   ALKPHOS 82  --  94  --   --   ALT 15  --  12*  --   --   AST 17  --  21  --   --   GLUCOSE 89  < > 127* 112* 191*  < > = values in this interval not displayed.  Imaging/Diagnostic Tests: No results found.  [name] Norlander, DO 06/02/2015, 6:00 AM PGY-2, [place] Intern pager: [phone], text pages welcome

## 2015-06-02 NOTE — Progress Notes (Signed)
2 Days Post-Op  Subjective: Awake likes her PCA better pain control  Objective: Vital signs in last 24 hours: Temp:  [96.5 F (35.8 C)-97.4 F (36.3 C)] 97.4 F (36.3 C) (05/07 0800) Pulse Rate:  [92-101] 94 (05/07 0600) Resp:  [8-30] 8 (05/07 0600) BP: (102-161)/(57-127) 151/84 mmHg (05/07 0600) SpO2:  [93 %-98 %] 97 % (05/07 0600) Last BM Date: 05/31/15  Intake/Output from previous day: 05/06 0701 - 05/07 0700 In: 4081.7 [I.V.:1611.5; IV Piggyback:550; TPN:1920.2] Out: 1704 [Urine:785; Emesis/NG output:485; Drains:419; Stool:15] Intake/Output this shift:    Cardio: sinus  Incision/Wound:Dressing in place distended ostomy pink viable   Lab Results:   Recent Labs  06/01/15 0500 06/02/15 0520  WBC 11.0* 11.5*  HGB 10.4* 9.6*  HCT 32.6* 30.7*  PLT 304 286   BMET  Recent Labs  06/01/15 0500 06/02/15 0645  NA 140 145  K 3.9 4.2  CL 106 112*  CO2 25 24  GLUCOSE 191* 194*  BUN 25* 36*  CREATININE 0.97 1.10*  CALCIUM 8.3* 8.8*   PT/INR  Recent Labs  05/31/15 0430 06/01/15 1117  LABPROT 18.3* 17.2*  INR 1.51* 1.40   ABG No results for input(s): PHART, HCO3 in the last 72 hours.  Invalid input(s): PCO2, PO2  Studies/Results: No results found.  Anti-infectives: Anti-infectives    Start     Dose/Rate Route Frequency Ordered Stop   05/31/15 2000  metroNIDAZOLE (FLAGYL) IVPB 500 mg     500 mg 100 mL/hr over 60 Minutes Intravenous Every 8 hours 05/31/15 1852     05/29/15 1200  piperacillin-tazobactam (ZOSYN) IVPB 3.375 g     3.375 g 12.5 mL/hr over 240 Minutes Intravenous Every 8 hours 05/29/15 0943     05/27/15 1200  piperacillin-tazobactam (ZOSYN) IVPB 2.25 g  Status:  Discontinued     2.25 g 100 mL/hr over 30 Minutes Intravenous Every 6 hours 05/27/15 0832 05/29/15 0943   05/27/15 1200  vancomycin (VANCOCIN) 50 mg/mL oral solution 125 mg  Status:  Discontinued     125 mg Oral Every 6 hours 05/27/15 0957 05/31/15 1852   05/27/15 1000   metroNIDAZOLE (FLAGYL) IVPB 500 mg  Status:  Discontinued     500 mg 100 mL/hr over 60 Minutes Intravenous Every 8 hours 05/27/15 0957 05/28/15 1617   05/27/15 0600  cefoTEtan (CEFOTAN) 2 g in dextrose 5 % 50 mL IVPB  Status:  Discontinued     2 g 100 mL/hr over 30 Minutes Intravenous To ShortStay Surgical 05/26/15 0832 05/27/15 1051   05/26/15 1300  metroNIDAZOLE (FLAGYL) tablet 500 mg  Status:  Discontinued     500 mg Oral 3 times per day on Sun 05/26/15 0928 05/26/15 0931   05/26/15 1300  neomycin (MYCIFRADIN) tablet 1,000 mg     1,000 mg Oral 3 times per day on Sun 05/26/15 0930 05/26/15 2139   05/26/15 1300  metroNIDAZOLE (FLAGYL) tablet 1,000 mg     1,000 mg Oral 3 times per day on Sun 05/26/15 0931 05/26/15 2139   05/26/15 0832  metroNIDAZOLE (FLAGYL) tablet 500 mg  Status:  Discontinued    Comments:  Take 2 pills (=1000mg ) by mouth at 1pm, 3pm, and 10pm the day before your colorectal operation   500 mg Oral As directed 05/26/15 0832 05/26/15 0925   05/26/15 0832  neomycin (MYCIFRADIN) tablet 500 mg  Status:  Discontinued     500 mg Oral As directed 05/26/15 0832 05/26/15 0930   05/26/15 0815  fluconazole (DIFLUCAN) IVPB 200  mg     200 mg 100 mL/hr over 60 Minutes Intravenous Every 24 hours 05/26/15 0811     05/26/15 0600  piperacillin-tazobactam (ZOSYN) IVPB 3.375 g  Status:  Discontinued     3.375 g 12.5 mL/hr over 240 Minutes Intravenous Every 8 hours 05/25/15 2155 05/27/15 0832   05/25/15 1900  piperacillin-tazobactam (ZOSYN) IVPB 3.375 g     3.375 g 100 mL/hr over 30 Minutes Intravenous  Once 05/25/15 1851 05/25/15 2002      Assessment/Plan: s/p Procedure(s): SIGMOID COLECTOMY (N/A) DESCENDING COLOSTOMY (N/A) SMALL BOWEL RESECTION (N/A) DRAINAGE OF PELVIC ABSCESS (N/A) OOB Add protonix Cont NPO Leave in ICU for today  Leave foley in place for 1 week due to repair of bladder  LOS: 8 days    [name] A. 06/02/2015

## 2015-06-02 NOTE — Progress Notes (Signed)
ANTICOAGULATION CONSULT NOTE - Follow Up Consult  Pharmacy Consult for Heparin Indication: atrial fibrillation  Allergies  Allergen Reactions  . Anti-Inflammatory Enzyme [Nutritional Supplements] Other (See Comments)    Retains fluids and headaches  . Iodinated Diagnostic Agents Hives    HIVES 15MIN S/P IV CONTRAST INJECTION,WILL NEED 13 HR PREP FOR FUTURE INJECTIONS, ok s/p 50mg  po benadryl//a.calhoun  . Spironolactone Other (See Comments)    Hair loss  . Arthrotec [Diclofenac-Misoprostol] Other (See Comments)    unknown  . Biaxin [Clarithromycin] Other (See Comments)    unknown  . Plavix [Clopidogrel Bisulfate] Other (See Comments)    unknown    Patient Measurements: Height: 5\' 5"  (165.1 cm) (05/25/15 @ 1352) Weight: 152 lb (68.947 kg) IBW/kg (Calculated) : 57 Heparin Dosing Weight: 68 kg  Vital Signs: Temp: 97.4 F (36.3 C) (05/07 0800) Temp Source: Oral (05/07 0800) BP: 151/84 mmHg (05/07 0600) Pulse Rate: 94 (05/07 0600)  Labs:  Recent Labs  05/31/15 0420  05/31/15 0430 06/01/15 0500 06/01/15 1115 06/01/15 1117 06/01/15 2230 06/02/15 0520 06/02/15 0645 06/02/15 0906  HGB  --   < > 8.4* 10.4*  --   --   --  9.6*  --   --   HCT  --   --  27.4* 32.6*  --   --   --  30.7*  --   --   PLT  --   --  326 304  --   --   --  286  --   --   LABPROT  --   --  18.3*  --   --  17.2*  --   --   --   --   INR  --   --  1.51*  --   --  1.40  --   --   --   --   HEPARINUNFRC  --   --  0.33  --  0.65  --  0.65  --   --  0.70  CREATININE 0.90  --   --  0.97  --   --   --   --  1.10*  --   < > = values in this interval not displayed.  Estimated Creatinine Clearance: 33.2 mL/min (by C-G formula based on Cr of 1.1).   Medications:  Scheduled:  . antiseptic oral rinse  7 mL Mouth Rinse q12n4p  . chlorhexidine  15 mL Mouth Rinse BID  . fluconazole (DIFLUCAN) IV  200 mg Intravenous Q24H  . furosemide  40 mg Intravenous BID  . insulin aspart  0-15 Units Subcutaneous Q4H  .  lip balm   Topical BID  . metronidazole  500 mg Intravenous Q8H  . morphine   Intravenous Q4H  . pantoprazole (PROTONIX) IV  40 mg Intravenous Daily  . piperacillin-tazobactam (ZOSYN)  IV  3.375 g Intravenous Q8H  . sodium chloride flush  10-40 mL Intracatheter Q12H  . sodium phosphate  Dextrose 5% IVPB  20 mmol Intravenous Once  . verapamil  120 mg Oral Daily    Assessment: 89 YOF on warfarin PTA for hx Afib - transitioned to heparin for planned colectomy - done on 5/5 and heparin resumed post-op.   A heparin level this morning resulted on the higher end of the therapeutic range despite multiple rate decreases (HL 0.7 << 0.65). Planning to aim for lower goal rate of 0.3-0.5 given the patient's recent surgery. Confirmed heparin infusing via the R-midline and labs being drawn from the [facility]. Hgb/Hct  slight drop, plts wnl. No bleeding noted per RN. Will reduce the rate again this morning.   Goal of Therapy:  Heparin level 0.3-0.5 units/ml (due to recent surgery) Monitor platelets by anticoagulation protocol: Yes    Plan:  1. Reduce Heparin drip rate to 850 units/hr (8.5 ml/hr) 2. Will continue to monitor for any signs/symptoms of bleeding and will follow up with heparin level in 8 hours   Alycia Rossetti, [name], BCPS Clinical Pharmacist Pager: [phone] 06/02/2015 10:20 AM

## 2015-06-02 NOTE — Progress Notes (Signed)
ANTICOAGULATION CONSULT NOTE - Follow Up Consult  Pharmacy Consult for Heparin Indication: atrial fibrillation  Allergies  Allergen Reactions  . Anti-Inflammatory Enzyme [Nutritional Supplements] Other (See Comments)    Retains fluids and headaches  . Iodinated Diagnostic Agents Hives    HIVES 15MIN S/P IV CONTRAST INJECTION,WILL NEED 13 HR PREP FOR FUTURE INJECTIONS, ok s/p 50mg  po benadryl//a.calhoun  . Spironolactone Other (See Comments)    Hair loss  . Arthrotec [Diclofenac-Misoprostol] Other (See Comments)    unknown  . Biaxin [Clarithromycin] Other (See Comments)    unknown  . Plavix [Clopidogrel Bisulfate] Other (See Comments)    unknown    Patient Measurements: Height: 5\' 5"  (075-GRM cm) (05/25/15 @ 1352) Weight: 152 lb (68.947 kg) IBW/kg (Calculated) : 57 Heparin Dosing Weight: 68 kg  Vital Signs: Temp: 97.5 F (36.4 C) (05/07 1600) Temp Source: Axillary (05/07 1600) BP: 105/76 mmHg (05/07 1900) Pulse Rate: 98 (05/07 1900)  Labs:  Recent Labs  05/31/15 0420  05/31/15 0430 06/01/15 0500  06/01/15 1117 06/01/15 2230 06/02/15 0520 06/02/15 0645 06/02/15 0906 06/02/15 1840  HGB  --   < > 8.4* 10.4*  --   --   --  9.6*  --   --   --   HCT  --   --  27.4* 32.6*  --   --   --  30.7*  --   --   --   PLT  --   --  326 304  --   --   --  286  --   --   --   LABPROT  --   --  18.3*  --   --  17.2*  --   --   --   --   --   INR  --   --  1.51*  --   --  1.40  --   --   --   --   --   HEPARINUNFRC  --   --  0.33  --   < >  --  0.65  --   --  0.70 0.45  CREATININE 0.90  --   --  0.97  --   --   --   --  1.10*  --   --   < > = values in this interval not displayed.  Estimated Creatinine Clearance: 33.2 mL/min (by C-G formula based on Cr of 1.1).   Medications:  Scheduled:  . antiseptic oral rinse  7 mL Mouth Rinse q12n4p  . chlorhexidine  15 mL Mouth Rinse BID  . fluconazole (DIFLUCAN) IV  200 mg Intravenous Q24H  . insulin aspart  0-15 Units Subcutaneous Q4H   . lip balm   Topical BID  . metronidazole  500 mg Intravenous Q8H  . morphine   Intravenous Q4H  . pantoprazole (PROTONIX) IV  40 mg Intravenous Daily  . piperacillin-tazobactam (ZOSYN)  IV  3.375 g Intravenous Q8H  . sodium chloride flush  10-40 mL Intracatheter Q12H  . sodium phosphate  Dextrose 5% IVPB  20 mmol Intravenous Once  . verapamil  120 mg Oral Daily    Assessment: 89 YOF on warfarin PTA for hx Afib - transitioned to heparin for planned colectomy - done on 5/5 and heparin resumed post-op.   Heparin level this evening is therapeutic (HL 0.45, aiming for lower goal of 0.3-0.5 d/t recent surgery). Confirmed heparin infusing via the R-midline and labs being drawn [facility] the [facility]. Hgb/Hct slight drop, plts wnl from  AM labs. No bleeding noted per RN.   Goal of Therapy:  Heparin level 0.3-0.5 units/ml (due to recent surgery) Monitor platelets by anticoagulation protocol: Yes    Plan:  1. Continue Heparin at 850 units/hr (8.5 ml/hr) 2. Will continue to monitor for any signs/symptoms of bleeding and will follow up with heparin level in the a.m.   Alycia Rossetti, [name], BCPS Clinical Pharmacist Pager: [phone] 06/02/2015 7:35 PM

## 2015-06-02 NOTE — Progress Notes (Signed)
PARENTERAL NUTRITION CONSULT NOTE - FOLLOW UP  Pharmacy Consult for TPN Indication: Diverticulitis with perforation  Allergies  Allergen Reactions  . Anti-Inflammatory Enzyme [Nutritional Supplements] Other (See Comments)    Retains fluids and headaches  . Iodinated Diagnostic Agents Hives    HIVES 15MIN S/P IV CONTRAST INJECTION,WILL NEED 13 HR PREP FOR FUTURE INJECTIONS, ok s/p 50mg  po benadryl//a.calhoun  . Spironolactone Other (See Comments)    Hair loss  . Arthrotec [Diclofenac-Misoprostol] Other (See Comments)    unknown  . Biaxin [Clarithromycin] Other (See Comments)    unknown  . Plavix [Clopidogrel Bisulfate] Other (See Comments)    unknown    Patient Measurements: Height: 5\' 5"  (165.1 cm) (05/25/15 @ 1352) Weight: 152 lb (68.947 kg) IBW/kg (Calculated) : 57 Adjusted Body Weight:  Usual Weight:   Vital Signs: Temp: 97.4 F (36.3 C) (05/07 0800) Temp Source: Oral (05/07 0800) BP: 151/84 mmHg (05/07 0600) Pulse Rate: 94 (05/07 0600) Intake/Output from previous day: 05/06 0701 - 05/07 0700 In: 4081.7 [I.V.:1611.5; IV Piggyback:550; TPN:1920.2] Out: 1704 [Urine:785; Emesis/NG output:485; Drains:419; Stool:15] Intake/Output from this shift:    Labs:  Recent Labs  05/31/15 0430 06/01/15 0500 06/01/15 1117 06/02/15 0520  WBC 7.7 11.0*  --  11.5*  HGB 8.4* 10.4*  --  9.6*  HCT 27.4* 32.6*  --  30.7*  PLT 326 304  --  286  INR 1.51*  --  1.40  --      Recent Labs  05/30/15 1000 05/31/15 0420 06/01/15 0500 06/02/15 0645  NA 132* 136 140 145  K 5.3* 3.9 3.9 4.2  CL 100* 102 106 112*  CO2 22 23 25 24   GLUCOSE 127* 112* 191* 194*  BUN 23* 22* 25* 36*  CREATININE 1.05* 0.90 0.97 1.10*  CALCIUM 8.2* 8.0* 8.3* 8.8*  MG 1.9  --  1.7  --   PHOS 2.8 2.6 2.9  --   PROT 5.8*  --   --   --   ALBUMIN 2.1*  --   --   --   AST 21  --   --   --   ALT 12*  --   --   --   ALKPHOS 94  --   --   --   BILITOT 0.5  --   --   --    Estimated Creatinine  Clearance: 33.2 mL/min (by C-G formula based on Cr of 1.1).    Recent Labs  06/01/15 2324 06/02/15 0336 06/02/15 0754  GLUCAP 167* 150* 167*    Medications:  Scheduled:  . antiseptic oral rinse  7 mL Mouth Rinse q12n4p  . chlorhexidine  15 mL Mouth Rinse BID  . fluconazole (DIFLUCAN) IV  200 mg Intravenous Q24H  . furosemide  40 mg Intravenous BID  . insulin aspart  0-9 Units Subcutaneous Q4H  . lip balm   Topical BID  . metronidazole  500 mg Intravenous Q8H  . morphine   Intravenous Q4H  . pantoprazole (PROTONIX) IV  40 mg Intravenous Daily  . piperacillin-tazobactam (ZOSYN)  IV  3.375 g Intravenous Q8H  . sodium chloride flush  10-40 mL Intracatheter Q12H  . sodium phosphate  Dextrose 5% IVPB  20 mmol Intravenous Once  . verapamil  120 mg Oral Daily    Insulin Requirements in the past 24 hours:  9 units sensitive SSI  Assessment: 82 y/o female with several recent admissions for diverticulitis admitted 05/25/2015 with diverticulitis/abscess with microperforation. She has previously been treated conservatively with  antibiotics but plan is for colectomy once C diff infection is under control.  Surgeries/Procedures/Scans: 4/29 CT abd - persistent diverticulitis with possible abscess 5/4 CT abd- diverticulitis involving sigmoid colon with abscess that is slightly larger  5/5 S/P sigmoid colectomy and descending colostomy  GI: LLQ tender, mult loose stools, abd distended.Pelvic abscess drain- 468mL. Albumin 2.4, prealbumin 4.5<<6.6.  PPI-IV Endo: A1c 5.2, CBGs 150-167, cont'd to improve through day on 5/6; < 150 preop. Lytes: Na 145-up from 128 over last 4 days.  K 4.2, CoCa 10.3 (Ca-Phos < 55). NaPhos 62mmol ordered 5/5 but not given.  Na 257mEq added to TPN Renal: SCr 1.1, CrCl ~30-35 ml/min, 745ml, I/O 2.2L with net (+)18L, NS @ 55 mL/hr Pulm: [place]-2L Cards: BP nl-high, a bit tachy.  Hepatobil: LFTs wnl, Tbili 0.8 ID: D#[id number] of Zosyn, metronidazole, fluconazole for  diverticulitis; C diff + on vancomycin PO 5/1-5/5, currently Flagyl IV while NPO 2nd ileus; WBC 11.5, AFeb (Tmin 96.5) AC/heme: Heparin/warfarin PTA for Afib, INR 1.51 - warfarin held; Hb 8.4, plt wnl.  Best Practices: SCDs  TPN Access: PICC 5/2 >> TPN start date: 5/2 >>  Nutritional Goals (per RD recommendations):  Kcal: 1500-1700 Protein: 70-80 gm  Current Nutrition:  Clear liquids  Plan:  -Cont Clinimix E 5/15 (WITH LYTES) at 70 mL/hr and IV lipids at 10 mL/hr. This will provide 1672 Kcal and 84 gm of protein which meets 100% of nutritional goals.  -Add MVI and TE to TPN  -Change SSI to mod scale -D/C NaCl from TPN -TPN labs qMon/Thurs   [name], [name] Clinical Pharmacist [facility]

## 2015-06-03 ENCOUNTER — Encounter (HOSPITAL_COMMUNITY): Payer: Self-pay | Admitting: Surgery

## 2015-06-03 DIAGNOSIS — R0689 Other abnormalities of breathing: Secondary | ICD-10-CM

## 2015-06-03 DIAGNOSIS — N189 Chronic kidney disease, unspecified: Secondary | ICD-10-CM

## 2015-06-03 DIAGNOSIS — Z9049 Acquired absence of other specified parts of digestive tract: Secondary | ICD-10-CM

## 2015-06-03 DIAGNOSIS — G8918 Other acute postprocedural pain: Secondary | ICD-10-CM

## 2015-06-03 DIAGNOSIS — D72829 Elevated white blood cell count, unspecified: Secondary | ICD-10-CM

## 2015-06-03 DIAGNOSIS — R195 Other fecal abnormalities: Secondary | ICD-10-CM | POA: Insufficient documentation

## 2015-06-03 DIAGNOSIS — D649 Anemia, unspecified: Secondary | ICD-10-CM | POA: Insufficient documentation

## 2015-06-03 DIAGNOSIS — I495 Sick sinus syndrome: Secondary | ICD-10-CM

## 2015-06-03 DIAGNOSIS — R000 Tachycardia, unspecified: Secondary | ICD-10-CM | POA: Insufficient documentation

## 2015-06-03 DIAGNOSIS — Z789 Other specified health status: Secondary | ICD-10-CM

## 2015-06-03 DIAGNOSIS — D62 Acute posthemorrhagic anemia: Secondary | ICD-10-CM

## 2015-06-03 DIAGNOSIS — R339 Retention of urine, unspecified: Secondary | ICD-10-CM

## 2015-06-03 DIAGNOSIS — K913 Postprocedural intestinal obstruction: Secondary | ICD-10-CM

## 2015-06-03 DIAGNOSIS — N179 Acute kidney failure, unspecified: Secondary | ICD-10-CM | POA: Insufficient documentation

## 2015-06-03 DIAGNOSIS — I6930 Unspecified sequelae of cerebral infarction: Secondary | ICD-10-CM

## 2015-06-03 DIAGNOSIS — I5032 Chronic diastolic (congestive) heart failure: Secondary | ICD-10-CM | POA: Insufficient documentation

## 2015-06-03 DIAGNOSIS — B9689 Other specified bacterial agents as the cause of diseases classified elsewhere: Secondary | ICD-10-CM

## 2015-06-03 LAB — COMPREHENSIVE METABOLIC PANEL
ALBUMIN: 1.8 g/dL — AB (ref 3.5–5.0)
ALT: 13 U/L — AB (ref 14–54)
AST: 15 U/L (ref 15–41)
Alkaline Phosphatase: 73 U/L (ref 38–126)
Anion gap: 10 (ref 5–15)
BUN: 47 mg/dL — AB (ref 6–20)
CHLORIDE: 111 mmol/L (ref 101–111)
CO2: 22 mmol/L (ref 22–32)
CREATININE: 1.27 mg/dL — AB (ref 0.44–1.00)
Calcium: 8.7 mg/dL — ABNORMAL LOW (ref 8.9–10.3)
GFR calc Af Amer: 42 mL/min — ABNORMAL LOW (ref >60)
GFR, EST NON AFRICAN AMERICAN: 36 mL/min — AB (ref >60)
GLUCOSE: 152 mg/dL — AB (ref 65–99)
POTASSIUM: 3.8 mmol/L (ref 3.5–5.1)
SODIUM: 143 mmol/L (ref 135–145)
Total Bilirubin: 0.4 mg/dL (ref 0.3–1.2)
Total Protein: 5.2 g/dL — ABNORMAL LOW (ref 6.5–8.1)

## 2015-06-03 LAB — DIFFERENTIAL
Basophils Absolute: 0 10*3/uL (ref 0.0–0.1)
Basophils Relative: 0 %
Eosinophils Absolute: 0 10*3/uL (ref 0.0–0.7)
Eosinophils Relative: 0 %
LYMPHS ABS: 1 10*3/uL (ref 0.7–4.0)
LYMPHS PCT: 7 %
MONOS PCT: 6 %
Monocytes Absolute: 1 10*3/uL (ref 0.1–1.0)
NEUTROS PCT: 87 %
Neutro Abs: 13.1 10*3/uL — ABNORMAL HIGH (ref 1.7–7.7)

## 2015-06-03 LAB — GLUCOSE, CAPILLARY
GLUCOSE-CAPILLARY: 133 mg/dL — AB (ref 65–99)
GLUCOSE-CAPILLARY: 137 mg/dL — AB (ref 65–99)
GLUCOSE-CAPILLARY: 136 mg/dL — AB (ref 65–99)
GLUCOSE-CAPILLARY: 133 mg/dL — AB (ref 65–99)
Glucose-Capillary: 122 mg/dL — ABNORMAL HIGH (ref 65–99)
Glucose-Capillary: 130 mg/dL — ABNORMAL HIGH (ref 65–99)
Glucose-Capillary: 150 mg/dL — ABNORMAL HIGH (ref 65–99)

## 2015-06-03 LAB — CBC
HEMATOCRIT: 32.2 % — AB (ref 36.0–46.0)
HEMOGLOBIN: 9.8 g/dL — AB (ref 12.0–15.0)
MCH: 25.7 pg — AB (ref 26.0–34.0)
MCHC: 30.4 g/dL (ref 30.0–36.0)
MCV: 84.3 fL (ref 78.0–100.0)
Platelets: 283 10*3/uL (ref 150–400)
RBC: 3.82 MIL/uL — AB (ref 3.87–5.11)
RDW: 16.4 % — ABNORMAL HIGH (ref 11.5–15.5)
WBC: 15.1 10*3/uL — ABNORMAL HIGH (ref 4.0–10.5)

## 2015-06-03 LAB — TRIGLYCERIDES: Triglycerides: 75 mg/dL (ref <150)

## 2015-06-03 LAB — PREALBUMIN: PREALBUMIN: 17.1 mg/dL — AB (ref 18–38)

## 2015-06-03 LAB — MAGNESIUM: MAGNESIUM: 1.8 mg/dL (ref 1.7–2.4)

## 2015-06-03 LAB — PHOSPHORUS: Phosphorus: 4.5 mg/dL (ref 2.5–4.6)

## 2015-06-03 LAB — HEPARIN LEVEL (UNFRACTIONATED): HEPARIN UNFRACTIONATED: 0.37 [IU]/mL (ref 0.30–0.70)

## 2015-06-03 MED ORDER — TRACE MINERALS CR-CU-MN-SE-ZN 10-1000-500-60 MCG/ML IV SOLN
INTRAVENOUS | Status: AC
  Administered 2015-06-03: 18:00:00 via INTRAVENOUS
  Filled 2015-06-03: qty 1680

## 2015-06-03 MED ORDER — FUROSEMIDE 10 MG/ML IJ SOLN
80.0000 mg | Freq: Every day | INTRAMUSCULAR | Status: DC
  Administered 2015-06-03 – 2015-06-06 (×4): 80 mg via INTRAVENOUS
  Filled 2015-06-03 (×4): qty 8

## 2015-06-03 MED ORDER — FAT EMULSION 20 % IV EMUL
240.0000 mL | INTRAVENOUS | Status: AC
  Administered 2015-06-03: 240 mL via INTRAVENOUS
  Filled 2015-06-03: qty 250

## 2015-06-03 NOTE — Progress Notes (Signed)
INFECTIOUS DISEASE PROGRESS NOTE  ID: [name] is a 82 y.o. female with  Principal Problem:   Diverticulitis of intestine with perforation and abscess Active Problems:   Chronic anticoagulation - Coumadin, CHADS2VASC=7   HTN (hypertension)   Pacemaker   Diabetes ([place])   History of CVA (cerebrovascular accident)   Tachycardia-bradycardia syndrome with symptomatic bradycardia   Chronic atrial fibrillation (HCC)   Diverticulitis   Supratherapeutic INR   CKD (chronic kidney disease) stage 3, GFR 30-59 ml/min   HOH (hard of hearing)   Protein-calorie malnutrition, moderate (HCC)   Diverticulitis of large intestine with perforation and abscess without bleeding   Status post partial colectomy   Ileus, postoperative   Postoperative pain   Chronic diastolic congestive heart failure ([place])   History of CVA with residual deficit   Respiratory depression   Stool culture positive for Clostridium difficile   On total parenteral nutrition (TPN)   Urinary retention   Leukocytosis   Tachycardia   Acute blood loss anemia   AKI (acute kidney injury) ([place])  Subjective: C/o hunger  Abtx:  Anti-infectives    Start     Dose/Rate Route Frequency Ordered Stop   05/31/15 2000  metroNIDAZOLE (FLAGYL) IVPB 500 mg     500 mg 100 mL/hr over 60 Minutes Intravenous Every 8 hours 05/31/15 1852     05/29/15 1200  piperacillin-tazobactam (ZOSYN) IVPB 3.375 g     3.375 g 12.5 mL/hr over 240 Minutes Intravenous Every 8 hours 05/29/15 0943     05/27/15 1200  piperacillin-tazobactam (ZOSYN) IVPB 2.25 g  Status:  Discontinued     2.25 g 100 mL/hr over 30 Minutes Intravenous Every 6 hours 05/27/15 0832 05/29/15 0943   05/27/15 1200  vancomycin (VANCOCIN) 50 mg/mL oral solution 125 mg  Status:  Discontinued     125 mg Oral Every 6 hours 05/27/15 0957 05/31/15 1852   05/27/15 1000  metroNIDAZOLE (FLAGYL) IVPB 500 mg  Status:  Discontinued     500 mg 100 mL/hr over 60 Minutes Intravenous Every 8  hours 05/27/15 0957 05/28/15 1617   05/27/15 0600  cefoTEtan (CEFOTAN) 2 g in dextrose 5 % 50 mL IVPB  Status:  Discontinued     2 g 100 mL/hr over 30 Minutes Intravenous To [facility] 05/26/15 0832 05/27/15 1051   05/26/15 1300  metroNIDAZOLE (FLAGYL) tablet 500 mg  Status:  Discontinued     500 mg Oral 3 times per day on Sun 05/26/15 0928 05/26/15 0931   05/26/15 1300  neomycin (MYCIFRADIN) tablet 1,000 mg     1,000 mg Oral 3 times per day on Sun 05/26/15 0930 05/26/15 2139   05/26/15 1300  metroNIDAZOLE (FLAGYL) tablet 1,000 mg     1,000 mg Oral 3 times per day on Sun 05/26/15 0931 05/26/15 2139   05/26/15 0832  metroNIDAZOLE (FLAGYL) tablet 500 mg  Status:  Discontinued    Comments:  Take 2 pills (=1000mg ) by mouth at 1pm, 3pm, and 10pm the day before your colorectal operation   500 mg Oral As directed 05/26/15 0832 05/26/15 0925   05/26/15 0832  neomycin (MYCIFRADIN) tablet 500 mg  Status:  Discontinued     500 mg Oral As directed 05/26/15 0832 05/26/15 0930   05/26/15 0815  fluconazole (DIFLUCAN) IVPB 200 mg     200 mg 100 mL/hr over 60 Minutes Intravenous Every 24 hours 05/26/15 0811     05/26/15 0600  piperacillin-tazobactam (ZOSYN) IVPB 3.375 g  Status:  Discontinued  3.375 g 12.5 mL/hr over 240 Minutes Intravenous Every 8 hours 05/25/15 2155 05/27/15 0832   05/25/15 1900  piperacillin-tazobactam (ZOSYN) IVPB 3.375 g     3.375 g 100 mL/hr over 30 Minutes Intravenous  Once 05/25/15 1851 05/25/15 2002      Medications:  Scheduled: . antiseptic oral rinse  7 mL Mouth Rinse q12n4p  . chlorhexidine  15 mL Mouth Rinse BID  . fluconazole (DIFLUCAN) IV  200 mg Intravenous Q24H  . furosemide  80 mg Intravenous Daily  . insulin aspart  0-15 Units Subcutaneous Q4H  . lip balm   Topical BID  . metronidazole  500 mg Intravenous Q8H  . morphine   Intravenous Q4H  . pantoprazole (PROTONIX) IV  40 mg Intravenous Daily  . piperacillin-tazobactam (ZOSYN)  IV  3.375 g  Intravenous Q8H  . sodium chloride flush  10-40 mL Intracatheter Q12H  . verapamil  120 mg Oral Daily    Objective: Vital signs in last 24 hours: Temp:  [97.3 F (36.3 C)-97.5 F (36.4 C)] 97.4 F (36.3 C) (05/08 1200) Pulse Rate:  [53-145] 60 (05/08 1500) Resp:  [5-29] 17 (05/08 1540) BP: (90-149)/(55-86) 132/68 mmHg (05/08 1500) SpO2:  [94 %-97 %] 96 % (05/08 1540)   General appearance: alert, cooperative and no distress Resp: rhonchi bilaterally and mild Cardio: regular rate and rhythm GI: normal findings: soft, non-tender and abnormal findings:  distended and hypoactive bowel sounds  Lab Results  Recent Labs  06/02/15 0520 06/02/15 0645 06/03/15 0513  WBC 11.5*  --  15.1*  HGB 9.6*  --  9.8*  HCT 30.7*  --  32.2*  NA  --  145 143  K  --  4.2 3.8  CL  --  112* 111  CO2  --  24 22  BUN  --  36* 47*  CREATININE  --  1.10* 1.27*   Liver Panel  Recent Labs  06/03/15 0513  PROT 5.2*  ALBUMIN 1.8*  AST 15  ALT 13*  ALKPHOS 73  BILITOT 0.4   Sedimentation Rate No results for input(s): ESRSEDRATE in the last 72 hours. C-Reactive Protein No results for input(s): CRP in the last 72 hours.  Microbiology: Recent Results (from the past 240 hour(s))  C difficile quick scan w PCR reflex     Status: Abnormal   Collection Time: 05/26/15 11:21 AM  Result Value Ref Range Status   C Diff antigen NEGATIVE NEGATIVE Final   C Diff toxin POSITIVE (A) NEGATIVE Final    Comment: CDIFF PCR WAS RAN AND CONFIRMED POSITIVE CRITICAL RESULT CALLED TO, READ BACK BY AND VERIFIED WITH: R.GREENAWALT,RN 05/26/15 @1729     C Diff interpretation Results are indeterminate. See PCR results.  Final  Clostridium Difficile by PCR     Status: Abnormal   Collection Time: 05/26/15 11:21 AM  Result Value Ref Range Status   Toxigenic C Difficile by pcr POSITIVE (A) NEGATIVE Final    Comment: CRITICAL RESULT CALLED TO, READ BACK BY AND VERIFIED WITH: R.GREENAWALT,RN 05/26/15 @1730  BY  V.WILKINS   MRSA PCR Screening     Status: None   Collection Time: 05/27/15  7:06 PM  Result Value Ref Range Status   MRSA by PCR NEGATIVE NEGATIVE Final    Comment:        The GeneXpert MRSA Assay (FDA approved for NASAL specimens only), is one component of a comprehensive MRSA colonization surveillance program. It is not intended to diagnose MRSA infection nor to guide or monitor treatment  for MRSA infections.   Surgical pcr screen     Status: None   Collection Time: 05/31/15 12:39 PM  Result Value Ref Range Status   MRSA, PCR NEGATIVE NEGATIVE Final   Staphylococcus aureus NEGATIVE NEGATIVE Final    Comment:        The Xpert SA Assay (FDA approved for NASAL specimens in patients over 18 years of age), is one component of a comprehensive surveillance program.  Test performance has been validated by [facility] for patients greater than or equal to 13 year old. It is not intended to diagnose infection nor to guide or monitor treatment.     Studies/Results: No results found.   Assessment/Plan: Sigmoid Colectomy Colostomy SB resection Pelvic Abscess drain C diff, recurrence ARF, CRI- resolved.  Protein calorie malnutrition, severe HTN  Total days of antibiotics: 9 zosyn, diflucan   Day 9 anti- C diff therapy (IV flagyl) Would- Stop diflucan Give her 2 weeks of zosyn post surgery (end 5-19) Continue IV flagyl til she can take po vanco  Then, at the end of 2 weeks of vanco or flagyl give her prolonged taper of po vanco 125mg  bid for 1 week 125mg  qdy for 1 week 125mg  qoday for 2 weeks.   Available as needed.          [name] Infectious Diseases (pager) [phone] [email or web address] 06/03/2015, 3:42 PM  LOS: 9 days

## 2015-06-03 NOTE — Progress Notes (Signed)
Family Medicine Teaching Service Daily Progress Note Intern Pager: [phone]  Patient name: AMALIE ASAY Medical record number: [id number] Date of birth: Mar 21, 1923 Age: 82 y.o. Gender: female  Primary Care Provider: [name], MD Consultants: ID, surgery  Code Status: Full   Pt Overview and Major Events to Date:  4/28: Readmit for Diverticulitis with possible perforation / abscess 5/5: Hartmann's procedure  Assessment and Plan: VICKEE BIGNELL is a 82 y.o. female presenting with LLQ abdominal pain. PMH is significant for atrial fibrillation, hx of CVA, hypertension, type 2 diabetes, tachycardia-bradycardia syndrome s/p pacemaker, prior pericardial effusion s/p pericardial window 12/16, sleep apnea (no CPAP), stroke (right frontal 2013), GI bleeding with hemorrhoids, and arthritis and recurrent diverticulitis.  # S/p Hartmann procedure on 5/5  following recurrent diverticulitis possibly perforation / abscess: Repeat CT Interval development of colovesical fistula between the sigmoid colon and anterior superior left bladder wall. Sigmoid mesentery abscess is mildly decreased in size. Left anterior pelvic pericolonic abscess is mildly increased in size.  Bilateral indeterminate renal lesions requiring further imaging. Slightly elevated WBC 15.1, likely due to recent surgery - Antibiotics regiment per ID  Give her 2 weeks of zosyn post surgery (end 5-19) Continue IV flagyl til she can take po vanco  Then, at the end of 2 weeks of vanco or flagyl give her prolonged taper of po vanco 125mg  bid for 1 week 125mg  qdy for 1 week 125mg  qoday for 2 weeks.  - Pain control: Morphine PCA pump per surgery - Renal mass requiring further imaging - c/s wound care for ostomy    # LE edema, 2+ pitting edema to the calf. On Demadex 80mg  daily at home - Lasix IV 80 mg daily  - Continue BMETs to monitor kidney fuction  - Strict I/Os  # C.difficule positive: Ostomy in place.  Having stool  output. - PO vancomycin 150 mg QID (5/1 > 5/5).  - Continue IV flagyl for now - return to PO vancomycin once patient no longer NPO - ID consulted, appreciate recs  - Contact precautions   # Chronic Atrial fibrillation: Ventricular Pacemaker for AF. INR supra-therapeutic on 4/26. INR 1.57>>1.51 - Continue Verapamil and Digoxin  - Heparin per pharmacy - Restart Warfarin once conversation with surgery   # DM2: Diet controlled. A1c 6.2 (12/2014)> 6.3  - monitor cbgs; SSI sensitive   # Anemia: Hgb 10.4 on admit (baseline 11). History of diverticulitis and hemorrhoids -Hgb 9.6 this am - am CBC  #CKD Stage 3: Baseline Cr 1-1.2  -stable   FEN/GI: NPO; SLIV, TPN  Prophylaxis: Heparin gtt  Disposition:  Leaving the ICU   Subjective:  Patient would like to eat as soon as possible but per surgery, not currently ready for advancement.   Objective: Temp:  [97.3 F (36.3 C)-97.5 F (36.4 C)] 97.5 F (36.4 C) (05/08 0355) Pulse Rate:  [53-102] 101 (05/08 0600) Resp:  [5-29] 11 (05/08 0600) BP: (90-155)/(55-98) 118/72 mmHg (05/08 0600) SpO2:  [94 %-99 %] 96 % (05/08 0600) Physical Exam: General: NAD, lying in bed Cardiovascular: regular rate and rhythm with 2/6 murmur, paced rhythm  Respiratory: fine bibasilar crackles, normal WOB, 2+ radial pulses  Abdomen: Soft, colostomy bag in place with brown stool in bag, minimal saturation of wound dressing, drain in place. Ext: +2-3 LE edema bilaterally   Laboratory:  Recent Labs Lab 06/01/15 0500 06/02/15 0520 06/03/15 0513  WBC 11.0* 11.5* 15.1*  HGB 10.4* 9.6* 9.8*  HCT 32.6* 30.7* 32.2*  PLT 304  286 283    Recent Labs Lab 05/28/15 0954  05/30/15 1000  06/01/15 0500 06/02/15 0645 06/03/15 0513  NA 130*  < > 132*  < > 140 145 143  K 4.5  < > 5.3*  < > 3.9 4.2 3.8  CL 91*  < > 100*  < > 106 112* 111  CO2 29  < > 22  < > 25 24 22   BUN 37*  < > 23*  < > 25* 36* 47*  CREATININE 1.72*  < > 1.05*  < > 0.97 1.10*  1.27*  CALCIUM 8.3*  < > 8.2*  < > 8.3* 8.8* 8.7*  PROT 5.5*  --  5.8*  --   --   --  5.2*  BILITOT 0.8  --  0.5  --   --   --  0.4  ALKPHOS 82  --  94  --   --   --  73  ALT 15  --  12*  --   --   --  13*  AST 17  --  21  --   --   --  15  GLUCOSE 89  < > 127*  < > 191* 194* 152*  < > = values in this interval not displayed.  Imaging/Diagnostic Tests: No results found.  Kelton Bultman Cletis Media, MD 06/03/2015, 7:07 AM PGY-1, [place] Intern pager: [phone], text pages welcome

## 2015-06-03 NOTE — Consult Note (Signed)
WOC ostomy consult note Stoma type/location: LLQ, end colostomy Stoma assessment: appears flush with the skin. Pouch intact, I will leave in place in order to meet with family in the am to demonstrate pouch change.  Output liquid green, small amount Ostomy pouching: 2pc. Intact, based on appearance of stoma will order 1pc flat pouch and barrier rings. Education provided: colostomy educational booklets given to patient, her daughter was at the bedside as well.  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  Patient plans to stay with family after DC for a while.  Plan to work with patient and her daughter in the am.  WOC will follow along with you for continued support with ostomy teaching and care [place] RN,CWOCN [id number]

## 2015-06-03 NOTE — Progress Notes (Signed)
ANTICOAGULATION CONSULT NOTE - Follow Up Consult  Pharmacy Consult for Heparin Indication: atrial fibrillation  Allergies  Allergen Reactions  . Anti-Inflammatory Enzyme [Nutritional Supplements] Other (See Comments)    Retains fluids and headaches  . Iodinated Diagnostic Agents Hives    HIVES 15MIN S/P IV CONTRAST INJECTION,WILL NEED 13 HR PREP FOR FUTURE INJECTIONS, ok s/p 50mg  po benadryl//a.calhoun  . Spironolactone Other (See Comments)    Hair loss  . Arthrotec [Diclofenac-Misoprostol] Other (See Comments)    unknown  . Biaxin [Clarithromycin] Other (See Comments)    unknown  . Plavix [Clopidogrel Bisulfate] Other (See Comments)    unknown    Patient Measurements: Height: 5\' 5"  (165.1 cm) (05/25/15 @ 1352) Weight: 152 lb (68.947 kg) IBW/kg (Calculated) : 57 Heparin Dosing Weight: 68 kg  Vital Signs: Temp: 97.5 F (36.4 C) (05/08 0355) Temp Source: Axillary (05/08 0355) BP: 129/69 mmHg (05/08 0700) Pulse Rate: 115 (05/08 0700)  Labs:  Recent Labs  06/01/15 0500  06/01/15 1117  06/02/15 0520 06/02/15 0645 06/02/15 0906 06/02/15 1840 06/03/15 0500 06/03/15 0513  HGB 10.4*  --   --   --  9.6*  --   --   --   --  9.8*  HCT 32.6*  --   --   --  30.7*  --   --   --   --  32.2*  PLT 304  --   --   --  286  --   --   --   --  283  LABPROT  --   --  17.2*  --   --   --   --   --   --   --   INR  --   --  1.40  --   --   --   --   --   --   --   HEPARINUNFRC  --   < >  --   < >  --   --  0.70 0.45 0.37  --   CREATININE 0.97  --   --   --   --  1.10*  --   --   --  1.27*  < > = values in this interval not displayed.  Estimated Creatinine Clearance: 28.7 mL/min (by C-G formula based on Cr of 1.27).  Assessment: 89 YOF on warfarin PTA for hx Afib - transitioned to heparin for planned colectomy - done on 5/5 and heparin resumed post-op.   Heparin level this evening is therapeutic (HL 0.37, aiming for lower goal of 0.3-0.5 d/t recent surgery). CBC is stable and no  bleeding noted.   Goal of Therapy:  Heparin level 0.3-0.5 units/ml (due to recent surgery) Monitor platelets by anticoagulation protocol: Yes    Plan:  1. Continue Heparin at 850 units/hr (8.5 ml/hr) 2. Will continue to monitor for any signs/symptoms of bleeding and will follow up with heparin level in the a.m.   Salome Arnt, [name], BCPS Pager # [phone] 06/03/2015 8:06 AM

## 2015-06-03 NOTE — Consult Note (Signed)
Physical Medicine and Rehabilitation Consult Reason for Consult: Deconditioning related diverticulitis/multi-medical Referring Physician: Family medicine   HPI: [name] is a 82 y.o. 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 Patient lives alone independent prior to admission using furniture at times for balance. Her daughter lives in the area and checks on her as needed. She has another daughter whom she may stay with, if needed. Presented 05/25/2015 of left lower quadrant abdominal pain. INR on admission of 3.01. CT of the abdomen showed diverticulitis involving the sigmoid colon with previously noted extraluminal collection of gas worrisome for abscess with signs of perforation. No change with conservative care and underwent sigmoid colectomy, descending colostomy, small bowel resection with placement of pelvic abscess drain 05/31/2015 per Dr.Tsuei. Hospital course pain management. C. difficile colitis range obtained on contact precautions. TPN initiated for nutritional support. Postop urinary retention advisement to continue Foley tube for 7 days postoperatively. Intravenous heparin ongoing await plans resume chronic Coumadin. Leukocytosis 28,300 and monitored. Intravenous Zosyn ongoing postoperatively question duration of antibiotic care. Physical therapy evaluation completed 06/02/2015 with recommendations of physical medicine rehabilitation consult.  Review of Systems  Constitutional: Negative for fever and chills.  HENT: Negative for hearing loss.   Eyes: Negative for double vision.  Respiratory: Positive for shortness of breath. Negative for cough.     Cardiovascular: Negative for chest pain.  Gastrointestinal: Positive for abdominal pain.  Genitourinary: Positive for urgency. Negative for dysuria and hematuria.  Musculoskeletal: Positive for joint pain.  Skin: Negative for rash.  Neurological: Positive for weakness. Negative for seizures and headaches.  All other systems reviewed and are negative.  Past Medical History  Diagnosis Date  . Pericardial effusion     a. s/p pericardial window 01/16/15  . History of GI bleed     a. secondary to AVM's. Treated with Fe infusion  . HTN (hypertension)   . Obesity   . LVH (left ventricular hypertrophy)   . Sleep apnea     mild-no cpap  . Positive TB test   . Anemia   . Stroke [facility])     a. 2013: right frontal  . Arthritis   . Tachycardia-bradycardia syndrome [facility])     a. s/p [employer]onic [place], model number [id number], serial number [id number] H 12/2013  . History of shingles   . Chronic diastolic (congestive) heart failure ([place])     a. Echo 01/2015 with EF of 55-60%.  . Chronic atrial fibrillation (HCC)     a. on Coumadin, Digoxin, and Verapamil   Past Surgical History  Procedure Laterality Date  . Breast lumpectomy Bilateral     negative for cancer  . Esophagogastroduodenoscopy  05/19/2011    Procedure: ESOPHAGOGASTRODUODENOSCOPY (EGD);  Surgeon: Lear Ng, MD;  Location: [facility] ENDOSCOPY;  Service: Endoscopy;  Laterality: N/A;  doctor aware of inr   will try to be here no later than 230  . Cholecystectomy  2005  . Esophagogastroduodenoscopy  06/22/2011    Procedure: ESOPHAGOGASTRODUODENOSCOPY (EGD);  Surgeon: Arta Silence, MD;  Location: [facility] ENDOSCOPY;  Service: Endoscopy;  Laterality: N/A;  Check PT/INR in am  . Givens capsule study  06/23/2011    Procedure: GIVENS CAPSULE STUDY;  Surgeon: Arta Silence, MD;  Location: [facility] ENDOSCOPY;  Service: Endoscopy;  Laterality: N/A;  .  Colonoscopy  08/11/2011    Procedure: COLONOSCOPY;  Surgeon: Jeryl Columbia, MD;  Location: [facility] ENDOSCOPY;   Service: Endoscopy;  Laterality: N/A;  . Hot hemostasis  08/11/2011    Procedure: HOT HEMOSTASIS (ARGON PLASMA COAGULATION/BICAP);  Surgeon: Jeryl Columbia, MD;  Location: [facility] ENDOSCOPY;  Service: Endoscopy;  Laterality: N/A;  . Mass excision Left 09/14/2013    Procedure: EXCISION MASS LEFT WRIST;  Surgeon: Leanora Cover, MD;  Location: [place];  Service: Orthopedics;  Laterality: Left;  . Tonsillectomy and adenoidectomy  1950  . Insert / replace / remove pacemaker  2001    Last generator in 2006; interrogated Dec 2012  . Cataract extraction w/ intraocular lens  implant, bilateral Bilateral   . Lipoma excision Left 07/2013    wrist  . Pacemaker generator change N/A 01/02/2014    Procedure: PACEMAKER GENERATOR CHANGE;  Surgeon: Sanda Klein, MD;  Location: [place] CATH LAB;  Service: Cardiovascular;  Laterality: N/A;  . Lead revision N/A 01/02/2014    Procedure: LEAD REVISION;  Surgeon: Sanda Klein, MD;  Location: [place] CATH LAB;  Service: Cardiovascular;  Laterality: N/A;  . Subxyphoid pericardial window N/A 01/16/2015    Procedure: SUBXYPHOID PERICARDIAL WINDOW;  Surgeon: Grace Isaac, MD;  Location: [place];  Service: Thoracic;  Laterality: N/A;  . Tee without cardioversion N/A 01/16/2015    Procedure: TRANSESOPHAGEAL ECHOCARDIOGRAM (TEE);  Surgeon: Grace Isaac, MD;  Location: [facility] OR;  Service: Thoracic;  Laterality: N/A;   Family History  Problem Relation Age of Onset  . Stroke Mother   . Stroke Father   . Pneumonia Father   . Colon cancer Sister   . Colon cancer Daughter    Social History:  reports that she has never smoked. She has never used smokeless tobacco. She reports that she drinks alcohol. She reports that she does not use illicit drugs. Allergies:  Allergies  Allergen Reactions  . Anti-Inflammatory Enzyme [Nutritional Supplements] Other (See Comments)    Retains fluids and headaches  . Iodinated Diagnostic Agents Hives    HIVES 15MIN S/P IV CONTRAST  INJECTION,WILL NEED 13 HR PREP FOR FUTURE INJECTIONS, ok s/p 50mg  po benadryl//a.calhoun  . Spironolactone Other (See Comments)    Hair loss  . Arthrotec [Diclofenac-Misoprostol] Other (See Comments)    unknown  . Biaxin [Clarithromycin] Other (See Comments)    unknown  . Plavix [Clopidogrel Bisulfate] Other (See Comments)    unknown   Medications Prior to Admission  Medication Sig Dispense Refill  . Digoxin 62.5 MCG TABS Take 0.0625 mg by mouth daily. Take only 5 days a week; skip Wednesdays and Sundays 30 tablet 6  . oxyCODONE (ROXICODONE) 5 MG immediate release tablet Take one half tablet in the morning as needed for back pain. (Patient taking differently: Take 2.5 mg by mouth daily as needed for severe pain. Take one half tablet in the morning as needed for back pain.) 20 tablet 0  . verapamil (CALAN-SR) 180 MG CR tablet take 1 tablet by mouth at bedtime (Patient taking differently: take 1 tablet by mouth ONCE DAILY) 30 tablet 11  . warfarin (COUMADIN) 5 MG tablet Take 2.5-5 mg by mouth as directed. Takes 0.5 tablet (2.5 mg total) by mouth Monday, Wednesday, Friday, Saturday. Take 1 tablet (5 mg total) by mouth Sunday, Tuesday, Thursday.    Marland Kitchen amoxicillin-clavulanate (AUGMENTIN) 500-125 MG tablet Take 1 tablet (500 mg total) by mouth every 12 (twelve) hours. Start tonight. (Patient not taking: Reported on 05/25/2015) 10 tablet  0  . gabapentin (NEURONTIN) 100 MG capsule Take 1 capsule (100 mg total) by mouth 3 (three) times daily. 90 capsule 3  . metolazone (ZAROXOLYN) 2.5 MG tablet Take 1 tablet (2.5 mg total) by mouth daily. (Patient not taking: Reported on 05/25/2015) 30 tablet 0  . ondansetron (ZOFRAN ODT) 4 MG disintegrating tablet Take 1 tablet (4 mg total) by mouth every 8 (eight) hours as needed for nausea or vomiting. (Patient not taking: Reported on 05/25/2015) 20 tablet 0  . potassium chloride (K-DUR,KLOR-CON) 10 MEQ tablet Take 2 tablets (20 mEq total) by mouth 2 (two) times daily.  Take with Torsemide 120 tablet 0  . torsemide (DEMADEX) 20 MG tablet Take 4 tablets (80 mg total) by mouth daily. (Patient not taking: Reported on 05/25/2015) 120 tablet 0    Home: Home Living Family/patient expects to be discharged to:: Private residence Living Arrangements: Alone Available Help at Discharge: Family, Available PRN/intermittently Type of Home: House Home Access: Stairs to enter Technical brewer of Steps: 2 Entrance Stairs-Rails: Left Home Layout: Two level Alternate Level Stairs-Number of Steps: 12 Alternate Level Stairs-Rails: Left Bathroom Shower/Tub: Tub/shower unit Home Equipment: Environmental consultant - 2 wheels  Functional History: Prior Function Level of Independence: Independent Functional Status:  Mobility: Bed Mobility Overal bed mobility: Needs Assistance Bed Mobility: Sit to Supine Sit to supine: +2 for safety/equipment, Mod assist General bed mobility comments: mod assist to lift LEs into bed; PT, tech, and RN present and helpful to untangle multiple lines and leads Transfers Overall transfer level: Needs assistance Equipment used: Rolling walker (2 wheeled) Transfers: Sit to/from Stand Sit to Stand: Mod assist General transfer comment: Cues for hand placement and safety; Pt does not want power up assist at axillae; gave support at elbow to encourage shoulder girdle stabilizing and use Ambulation/Gait Ambulation/Gait assistance: Min assist Ambulation Distance (Feet): 3 Feet (pivot and sidesteps back t bed) Assistive device: Rolling walker (2 wheeled) Gait Pattern/deviations: Shuffle General Gait Details: modest instability, dependence on RW for safety at this time    ADL:    Cognition: Cognition Overall Cognitive Status: Impaired/Different from baseline Orientation Level: Oriented X4 Cognition Arousal/Alertness: Awake/alert Behavior During Therapy: WFL for tasks assessed/performed Overall Cognitive Status: Impaired/Different from baseline Area  of Impairment: Awareness Awareness: Anticipatory  Blood pressure 130/62, pulse 76, temperature 97.5 F (36.4 C), temperature source Axillary, resp. rate 16, height 5\' 5"  (1.651 m), weight 68.947 kg (152 lb), SpO2 94 %. Physical Exam  Vitals reviewed. Constitutional: She appears well-developed and well-nourished.  HENT:  Head: Normocephalic and atraumatic.  Eyes: Conjunctivae and EOM are normal.  Neck: Normal range of motion. Neck supple. No thyromegaly present.  Cardiovascular:  Murmur heard. Irregularly irregular  Respiratory: Effort normal and breath sounds normal. No respiratory distress.  GI: Soft. Bowel sounds are normal.  + NG. Colostomy in place.  Musculoskeletal: She exhibits edema. She exhibits no tenderness.  Neurological: She is alert.  A&O2 HOH Sensation intact light touch neck sign DTRs symmetric Motor: Bilateral upper extremities 5/5 proximal to distal Left lower extremity: Hip flexion, knee extension 3 -/5, ankle dorsi/plantar flexion were +/5 (baseline) Right lower extremity: Hip flexion, knee extension 4/5, ankle dorsi/plantarflexion 5/5  Skin: Skin is warm and dry.  Psychiatric: She has a normal mood and affect. Her behavior is normal.    Results for orders placed or performed during the hospital encounter of 05/25/15 (from the past 24 hour(s))  Basic metabolic panel     Status: Abnormal   Collection Time: 06/02/15  6:45 AM  Result Value Ref Range   Sodium 145 135 - 145 mmol/L   Potassium 4.2 3.5 - 5.1 mmol/L   Chloride 112 (H) 101 - 111 mmol/L   CO2 24 22 - 32 mmol/L   Glucose, Bld 194 (H) 65 - 99 mg/dL   BUN 36 (H) 6 - 20 mg/dL   Creatinine, Ser 1.10 (H) 0.44 - 1.00 mg/dL   Calcium 8.8 (L) 8.9 - 10.3 mg/dL   GFR calc non Af Amer 43 (L) >60 mL/min   GFR calc Af Amer 50 (L) >60 mL/min   Anion gap 9 5 - 15  Glucose, capillary     Status: Abnormal   Collection Time: 06/02/15  7:54 AM  Result Value Ref Range   Glucose-Capillary 167 (H) 65 - 99 mg/dL    Heparin level (unfractionated)     Status: None   Collection Time: 06/02/15  9:06 AM  Result Value Ref Range   Heparin Unfractionated 0.70 0.30 - 0.70 IU/mL  Glucose, capillary     Status: Abnormal   Collection Time: 06/02/15 11:40 AM  Result Value Ref Range   Glucose-Capillary 167 (H) 65 - 99 mg/dL  Glucose, capillary     Status: Abnormal   Collection Time: 06/02/15  4:01 PM  Result Value Ref Range   Glucose-Capillary 160 (H) 65 - 99 mg/dL  Heparin level (unfractionated)     Status: None   Collection Time: 06/02/15  6:40 PM  Result Value Ref Range   Heparin Unfractionated 0.45 0.30 - 0.70 IU/mL  Glucose, capillary     Status: Abnormal   Collection Time: 06/02/15  7:46 PM  Result Value Ref Range   Glucose-Capillary 139 (H) 65 - 99 mg/dL  Glucose, capillary     Status: Abnormal   Collection Time: 06/02/15 11:50 PM  Result Value Ref Range   Glucose-Capillary 150 (H) 65 - 99 mg/dL  Glucose, capillary     Status: Abnormal   Collection Time: 06/03/15  3:30 AM  Result Value Ref Range   Glucose-Capillary 133 (H) 65 - 99 mg/dL  Heparin level (unfractionated)     Status: None   Collection Time: 06/03/15  5:00 AM  Result Value Ref Range   Heparin Unfractionated 0.37 0.30 - 0.70 IU/mL  Triglycerides     Status: None   Collection Time: 06/03/15  5:12 AM  Result Value Ref Range   Triglycerides 75 <150 mg/dL  Comprehensive metabolic panel     Status: Abnormal   Collection Time: 06/03/15  5:13 AM  Result Value Ref Range   Sodium 143 135 - 145 mmol/L   Potassium 3.8 3.5 - 5.1 mmol/L   Chloride 111 101 - 111 mmol/L   CO2 22 22 - 32 mmol/L   Glucose, Bld 152 (H) 65 - 99 mg/dL   BUN 47 (H) 6 - 20 mg/dL   Creatinine, Ser 1.27 (H) 0.44 - 1.00 mg/dL   Calcium 8.7 (L) 8.9 - 10.3 mg/dL   Total Protein 5.2 (L) 6.5 - 8.1 g/dL   Albumin 1.8 (L) 3.5 - 5.0 g/dL   AST 15 15 - 41 U/L   ALT 13 (L) 14 - 54 U/L   Alkaline Phosphatase 73 38 - 126 U/L   Total Bilirubin 0.4 0.3 - 1.2 mg/dL   GFR  calc non Af Amer 36 (L) >60 mL/min   GFR calc Af Amer 42 (L) >60 mL/min   Anion gap 10 5 - 15  Magnesium     Status:  None   Collection Time: 06/03/15  5:13 AM  Result Value Ref Range   Magnesium 1.8 1.7 - 2.4 mg/dL  Phosphorus     Status: None   Collection Time: 06/03/15  5:13 AM  Result Value Ref Range   Phosphorus 4.5 2.5 - 4.6 mg/dL  CBC     Status: Abnormal   Collection Time: 06/03/15  5:13 AM  Result Value Ref Range   WBC 15.1 (H) 4.0 - 10.5 K/uL   RBC 3.82 (L) 3.87 - 5.11 MIL/uL   Hemoglobin 9.8 (L) 12.0 - 15.0 g/dL   HCT 32.2 (L) 36.0 - 46.0 %   MCV 84.3 78.0 - 100.0 fL   MCH 25.7 (L) 26.0 - 34.0 pg   MCHC 30.4 30.0 - 36.0 g/dL   RDW 16.4 (H) 11.5 - 15.5 %   Platelets 283 150 - 400 K/uL  Differential     Status: Abnormal   Collection Time: 06/03/15  5:13 AM  Result Value Ref Range   Neutrophils Relative % 87 %   Neutro Abs 13.1 (H) 1.7 - 7.7 K/uL   Lymphocytes Relative 7 %   Lymphs Abs 1.0 0.7 - 4.0 K/uL   Monocytes Relative 6 %   Monocytes Absolute 1.0 0.1 - 1.0 K/uL   Eosinophils Relative 0 %   Eosinophils Absolute 0.0 0.0 - 0.7 K/uL   Basophils Relative 0 %   Basophils Absolute 0.0 0.0 - 0.1 K/uL  Prealbumin     Status: Abnormal   Collection Time: 06/03/15  5:13 AM  Result Value Ref Range   Prealbumin 17.1 (L) 18 - 38 mg/dL   No results found.  Assessment/Plan: Diagnosis: Deconditioning related diverticulitis/multi-medical Labs and images independently reviewed.  Records reviewed and summated above.  1. Does the need for close, 24 hr/day medical supervision in concert with the patient's rehab needs make it unreasonable for this patient to be served in a less intensive setting? Yes  2. Co-Morbidities requiring supervision/potential complications:  Diverticulitis (status post colostomy), chronic diastolic congestive heart failure (monitor weights, continue meds), diabetes mellitus (Monitor in accordance with exercise and adjust meds as necessary), atrial  fibrillation with tachybrady syndrome (status post pacemaker,continue meds), HTN (monitor and provide prns in accordance with increased physical exertion and pain), posterior right frontal lobe with residual left-sided weakness , respiratory depression (wean narcotics as tolerated),  pain management (Biofeedback training with therapies to help reduce reliance on opiate pain medications, monitor pain control during therapies, and sedation at rest and titrate to maximum efficacy to ensure participation and gains in therapies), C. Difficile (continue precautions), TPN (advance diet as tolerated), urinary retention (DC Foley when possible to limit chance of infection), leukocytosis (cont to monitor for signs and symptoms of infection, further workup if indicated), Tachycardia (monitor in accordance with pain and increasing activity), ABLA (transfuse if necessary to ensure appropriate perfusion for increased activity tolerance), AKI (avoid nephrotoxic meds) 3. Due to bladder management, bowel management, safety, skin/wound care, disease management, medication administration, pain management and patient education, does the patient require 24 hr/day rehab nursing? Yes 4. Does the patient require coordinated care of a physician, rehab nurse, PT (1-2 hrs/day, 5 days/week), OT (1-2 hrs/day, 5 days/week) and SLP (1-2 hrs/day, 5 days/week) to address physical and functional deficits in the context of the above medical diagnosis(es)? Yes Addressing deficits in the following areas: balance, endurance, locomotion, strength, transferring, bowel/bladder control, bathing, dressing, toileting, cognition, swallowing and psychosocial support 5. Can the patient actively participate in an intensive therapy  program of at least 3 hrs of therapy per day at least 5 days per week? Yes 6. The potential for patient to make measurable gains while on inpatient rehab is excellent 7. Anticipated functional outcomes upon discharge from  inpatient rehab are modified independent and supervision  with PT, modified independent and supervision with OT, independent and modified independent with SLP. 8. Estimated rehab length of stay to reach the above functional goals is:  7-10 days. 9. Does the patient have adequate social supports and living environment to accommodate these discharge functional goals? Yes 10. Anticipated D/C setting: Home 11. Anticipated post D/C treatments: HH therapy and Home excercise program 12. Overall Rehab/Functional Prognosis: excellent  RECOMMENDATIONS: This patient's condition is appropriate for continued rehabilitative care in the following setting: CIR once medically appropriate. Patient has agreed to participate in recommended program. Yes Note that insurance prior authorization may be required for reimbursement for recommended care.  Comment: Rehab Admissions Coordinator to follow up.  Delice Lesch, MD 06/03/2015

## 2015-06-03 NOTE — Consult Note (Signed)
   [facility] Inpatient Consult   06/03/2015  [name] April 20, 1923 [id number] Patient screened for potential [facility] services. Patient is eligible for [place] with her Medicare benefits.  Patient has been admitted 5 times in the past 6 months. . Electronic medical record reveals patient's discharge plan is for inpatient rehab. [facility] Care Management services not appropriate at this time. If patient's post hospital needs change or discharge plans for community follow up,  please place a [place] Management consult. For questions please contact:   Natividad Brood, RN BSN [facility] Liaison  [phone] business mobile phone Toll free office [phone]

## 2015-06-03 NOTE — Progress Notes (Addendum)
Foley was removed 5/7 despite order to leave in. Will discuss with MD on AM rounds.  Discussed with Dr. Hulen Skains. Foley replaced per order.

## 2015-06-03 NOTE — Progress Notes (Signed)
Physical Therapy Treatment Patient Details Name: [name] MRN: [id number] DOB: April 11, 1923 Today's Date: 06/03/2015    History of Present Illness 64 F with known a fib/tachy-brady syndrome s/p pacemaker and other multiple comorbidities who has had several recurrent admission for diverticulitis and presents now with the same. States pain has never really abated since last discharge. Also with profound weakness and fatigue; Diverticulitis with possible perforation / abscess; s/p Sigmoid colectomy, descending colostomy, small bowel resection, placement of pelvic abscess drain    PT Comments    Patient progressing with ambulation this session and able to get into hallway.  Feel she will benefit from continued skilled PT to progress to goals and allow d/c home after CIR level rehab stay.  Follow Up Recommendations  CIR     Equipment Recommendations  None recommended by PT    Recommendations for Other Services       Precautions / Restrictions Precautions Precautions: Fall Precaution Comments: multiple lines, J-P drain    Mobility  Bed Mobility Overal bed mobility: Needs Assistance Bed Mobility: Supine to Sit       Sit to supine: Mod assist   General bed mobility comments: assist to elevate trunk and scoot to EOB (pt requested I let her do it herself)  Transfers Overall transfer level: Needs assistance Equipment used: Rolling walker (2 wheeled) Transfers: Sit to/from Stand Sit to Stand: Mod assist;Min assist         General transfer comment: assist for balance and initially for lifting, but less assist on subsequent trials  Ambulation/Gait Ambulation/Gait assistance: Min assist;+2 safety/equipment Ambulation Distance (Feet): 25 Feet (x 2 with seated rest)   Gait Pattern/deviations: Step-through pattern;Step-to pattern;Decreased stride length;Shuffle;Wide base of support     General Gait Details: assist for safety, managing lines and another for bringing chair  behind.  Encouragement to increase distance due to pt wanting to stay in the room   Stairs            Wheelchair Mobility    Modified Rankin (Stroke Patients Only)       Balance Overall balance assessment: Needs assistance Sitting-balance support: Feet supported;No upper extremity supported Sitting balance-Leahy Scale: Good     Standing balance support: Bilateral upper extremity supported Standing balance-Leahy Scale: Poor Standing balance comment: UE support needed for standing                    Cognition Arousal/Alertness: Awake/alert Behavior During Therapy: WFL for tasks assessed/performed Overall Cognitive Status: Within Functional Limits for tasks assessed                      Exercises      General Comments General comments (skin integrity, edema, etc.): daughter present and assisted to push chair behind pt as well as to encourage her to keep walking      Pertinent Vitals/Pain Pain Assessment: 0-10 Pain Score: 5  Pain Location: abdomen Pain Descriptors / Indicators: Operative site guarding;Discomfort;Guarding Pain Intervention(s): Repositioned;Monitored during session;PCA encouraged    Home Living                      Prior Function            PT Goals (current goals can now be found in the care plan section) Progress towards PT goals: Progressing toward goals    Frequency  Min 3X/week    PT Plan Current plan remains appropriate    Co-evaluation  End of Session Equipment Utilized During Treatment: Oxygen   Patient left: in bed;Other (comment)     Time: [id number] PT Time Calculation (min) (ACUTE ONLY): 32 min  Charges:  $Gait Training: 8-22 mins $Therapeutic Activity: 8-22 mins                    G Codes:      [name] 2015/06/22, 4:27 PM  [name], [name] 2015-06-22

## 2015-06-03 NOTE — Progress Notes (Signed)
3 Days Post-Op  Subjective: POD 3 s/p ex lap and hartman's procedure with SBR.  Has had some intermittent RVR overnight however improved currently.  Not complaining of pain. Ostomy with small amount out.  Foley discontinued yesterday however replaced this morning.  Objective: Vital signs in last 24 hours: Temp:  [97.3 F (36.3 C)-97.5 F (36.4 C)] 97.3 F (36.3 C) (05/08 0800) Pulse Rate:  [53-145] 106 (05/08 1000) Resp:  [5-29] 17 (05/08 1000) BP: (90-155)/(55-98) 120/80 mmHg (05/08 1000) SpO2:  [94 %-99 %] 96 % (05/08 1000) Last BM Date: 06/02/15 (colostomy)  Intake/Output from previous day: 05/07 0701 - 05/08 0700 In: 2590.6 [I.V.:240.6; IV Piggyback:550; TPN:1800] Out: [id number] [Urine:1885; Emesis/NG output:300; Drains:250; Stool:20] Intake/Output this shift: Total I/O In: 535.5 [I.V.:25.5; Other:50; IV Piggyback:100; TPN:360] Out: 250 [Urine:200; Drains:50]  General appearance: alert, cooperative, appears stated age and no distress GI: Soft.  Appropriately tender to palpation.  Dressing in place.  Ostomy in LLQ with stool in bag.  NG in place with thin gastric content in tubing  Lab Results:   Recent Labs  06/02/15 0520 06/03/15 0513  WBC 11.5* 15.1*  HGB 9.6* 9.8*  HCT 30.7* 32.2*  PLT 286 283   BMET  Recent Labs  06/02/15 0645 06/03/15 0513  NA 145 143  K 4.2 3.8  CL 112* 111  CO2 24 22  GLUCOSE 194* 152*  BUN 36* 47*  CREATININE 1.10* 1.27*  CALCIUM 8.8* 8.7*   PT/INR  Recent Labs  06/01/15 1117  LABPROT 17.2*  INR 1.40   ABG No results for input(s): PHART, HCO3 in the last 72 hours.  Invalid input(s): PCO2, PO2  Studies/Results: No results found.  Anti-infectives: Anti-infectives    Start     Dose/Rate Route Frequency Ordered Stop   05/31/15 2000  metroNIDAZOLE (FLAGYL) IVPB 500 mg     500 mg 100 mL/hr over 60 Minutes Intravenous Every 8 hours 05/31/15 1852     05/29/15 1200  piperacillin-tazobactam (ZOSYN) IVPB 3.375 g     3.375  g 12.5 mL/hr over 240 Minutes Intravenous Every 8 hours 05/29/15 0943     05/27/15 1200  piperacillin-tazobactam (ZOSYN) IVPB 2.25 g  Status:  Discontinued     2.25 g 100 mL/hr over 30 Minutes Intravenous Every 6 hours 05/27/15 0832 05/29/15 0943   05/27/15 1200  vancomycin (VANCOCIN) 50 mg/mL oral solution 125 mg  Status:  Discontinued     125 mg Oral Every 6 hours 05/27/15 0957 05/31/15 1852   05/27/15 1000  metroNIDAZOLE (FLAGYL) IVPB 500 mg  Status:  Discontinued     500 mg 100 mL/hr over 60 Minutes Intravenous Every 8 hours 05/27/15 0957 05/28/15 1617   05/27/15 0600  cefoTEtan (CEFOTAN) 2 g in dextrose 5 % 50 mL IVPB  Status:  Discontinued     2 g 100 mL/hr over 30 Minutes Intravenous To [facility] 05/26/15 0832 05/27/15 1051   05/26/15 1300  metroNIDAZOLE (FLAGYL) tablet 500 mg  Status:  Discontinued     500 mg Oral 3 times per day on Sun 05/26/15 0928 05/26/15 0931   05/26/15 1300  neomycin (MYCIFRADIN) tablet 1,000 mg     1,000 mg Oral 3 times per day on Sun 05/26/15 0930 05/26/15 2139   05/26/15 1300  metroNIDAZOLE (FLAGYL) tablet 1,000 mg     1,000 mg Oral 3 times per day on Sun 05/26/15 0931 05/26/15 2139   05/26/15 0832  metroNIDAZOLE (FLAGYL) tablet 500 mg  Status:  Discontinued  Comments:  Take 2 pills (=1000mg ) by mouth at 1pm, 3pm, and 10pm the day before your colorectal operation   500 mg Oral As directed 05/26/15 0832 05/26/15 0925   05/26/15 0832  neomycin (MYCIFRADIN) tablet 500 mg  Status:  Discontinued     500 mg Oral As directed 05/26/15 [id number] 05/26/15 0930   05/26/15 0815  fluconazole (DIFLUCAN) IVPB 200 mg     200 mg 100 mL/hr over 60 Minutes Intravenous Every 24 hours 05/26/15 0811     05/26/15 0600  piperacillin-tazobactam (ZOSYN) IVPB 3.375 g  Status:  Discontinued     3.375 g 12.5 mL/hr over 240 Minutes Intravenous Every 8 hours 05/25/15 2155 05/27/15 0832   05/25/15 1900  piperacillin-tazobactam (ZOSYN) IVPB 3.375 g     3.375 g 100 mL/hr over 30  Minutes Intravenous  Once 05/25/15 1851 05/25/15 2002      Assessment/Plan: s/p Procedure(s): SIGMOID COLECTOMY (N/A) DESCENDING COLOSTOMY (N/A) SMALL BOWEL RESECTION (N/A) DRAINAGE OF PELVIC ABSCESS (N/A) Given ostomy function and thin fluid in NG tubing, will dc NG.  Ok for ice and water.  Would proceed slowly though.  Continue with Foley to gravity drain.  Plan to discontinue foley after 1 week given dense adhesion of bowel to bladder. WBC is elevated today compared to yesterday, however, she appears good.  Will continue to monitor.  LOS: 9 days    [name] 06/03/2015

## 2015-06-03 NOTE — Progress Notes (Signed)
[facility] NOTE  Pharmacy Consult for TPN Indication: Diverticulitis with perforation  Allergies  Allergen Reactions  . Anti-Inflammatory Enzyme [Nutritional Supplements] Other (See Comments)    Retains fluids and headaches  . Iodinated Diagnostic Agents Hives    HIVES 15MIN S/P IV CONTRAST INJECTION,WILL NEED 13 HR PREP FOR FUTURE INJECTIONS, ok s/p 50mg  po benadryl//a.calhoun  . Spironolactone Other (See Comments)    Hair loss  . Arthrotec [Diclofenac-Misoprostol] Other (See Comments)    unknown  . Biaxin [Clarithromycin] Other (See Comments)    unknown  . Plavix [Clopidogrel Bisulfate] Other (See Comments)    unknown    Patient Measurements: Height: 5\' 5"  (165.1 cm) (05/25/15 @ 1352) Weight: 152 lb (68.947 kg) IBW/kg (Calculated) : 57 Adjusted Body Weight:  Usual Weight:   Vital Signs: Temp: 97.5 F (36.4 C) (05/08 0355) Temp Source: Axillary (05/08 0355) BP: 129/69 mmHg (05/08 0700) Pulse Rate: 115 (05/08 0700) Intake/Output from previous day: 05/07 0701 - 05/08 0700 In: 2590.6 [I.V.:240.6; IV Piggyback:550; TPN:1800] Out: 2455 [Urine:1885; Emesis/NG output:300; Drains:250; Stool:20] Intake/Output from this shift:    Labs:  Recent Labs  06/01/15 0500 06/01/15 1117 06/02/15 0520 06/03/15 0513  WBC 11.0*  --  11.5* 15.1*  HGB 10.4*  --  9.6* 9.8*  HCT 32.6*  --  30.7* 32.2*  PLT 304  --  286 283  INR  --  1.40  --   --      Recent Labs  06/01/15 0500 06/02/15 0645 06/03/15 0512 06/03/15 0513  NA 140 145  --  143  K 3.9 4.2  --  3.8  CL 106 112*  --  111  CO2 25 24  --  22  GLUCOSE 191* 194*  --  152*  BUN 25* 36*  --  47*  CREATININE 0.97 1.10*  --  1.27*  CALCIUM 8.3* 8.8*  --  8.7*  MG 1.7  --   --  1.8  PHOS 2.9  --   --  4.5  PROT  --   --   --  5.2*  ALBUMIN  --   --   --  1.8*  AST  --   --   --  15  ALT  --   --   --  13*  ALKPHOS  --   --   --  73  BILITOT  --   --   --  0.4  PREALBUMIN  --   --   --  17.1*   TRIG  --   --  75  --    Estimated Creatinine Clearance: 28.7 mL/min (by C-G formula based on Cr of 1.27).    Recent Labs  06/02/15 1946 06/02/15 2350 06/03/15 0330  GLUCAP 139* 150* 133*    Medications:  Scheduled:  . antiseptic oral rinse  7 mL Mouth Rinse q12n4p  . chlorhexidine  15 mL Mouth Rinse BID  . fluconazole (DIFLUCAN) IV  200 mg Intravenous Q24H  . insulin aspart  0-15 Units Subcutaneous Q4H  . lip balm   Topical BID  . metronidazole  500 mg Intravenous Q8H  . morphine   Intravenous Q4H  . pantoprazole (PROTONIX) IV  40 mg Intravenous Daily  . piperacillin-tazobactam (ZOSYN)  IV  3.375 g Intravenous Q8H  . sodium chloride flush  10-40 mL Intracatheter Q12H  . sodium phosphate  Dextrose 5% IVPB  20 mmol Intravenous Once  . verapamil  120 mg Oral Daily    Insulin Requirements in  the past 24 hours:  14 units sensitive SSI  Assessment: 82 y/o female with several recent admissions for diverticulitis admitted 05/25/2015 with diverticulitis/abscess with microperforation. She has previously been treated conservatively with antibiotics but plan is for colectomy once C diff infection is under control.  Surgeries/Procedures/Scans: 4/29 CT abd - persistent diverticulitis with possible abscess 5/4 CT abd- diverticulitis involving sigmoid colon with abscess that is slightly larger  5/5 S/P sigmoid colectomy and descending colostomy  GI:LLQ tender, mult loose stools, abd distended.Pelvic abscess drainage decreasing, 250 cc yesterday. Albumin 1.8, prealbumin up to 17.1.  PPI-IV. Small amt of stool from ostomy. Endo: A1c 5.2, CBGs 130-170 on ssi-moderate Lytes: Na 143, K 3.8,  CoCa 10.3 (Ca-Phos < 55).  Renal: SCr 1.27 CrCl ~30 ml/min, 723ml, I/O 2.2L with net (+)18L, NS @ 55 mL/hr Pulm: [place]-2L Cards: BP nl-high, a bit tachy.  Hepatobil: LFTs wnl, Tbili 0.8 ID: D#7 of Zosyn, metronidazole, fluconazole for diverticulitis; C diff + on vancomycin PO 5/1-5/5, currently  Flagyl IV while NPO 2nd ileus; WBC 11.5, AFeb (Tmin 96.5) AC/heme: Warfarin PTA for AFib, now on heparin gtt, cbc wnl Best Practices: SCDs  TPN Access: PICC 5/2 >> TPN start date: 5/2 >>  Nutritional Goals: Kcal 1500-1700 Protein 70-80 gm Per RD 5/2  Current Nutrition:  Clear liquids  Plan:  -Cont Clinimix E 5/15 at 70 mL/hr and IV lipids at 10 mL/hr. This will provide 1672 Kcal and 84 gm of protein which meets 100% of nutritional goals.  -Add MVI and TE to TPN  -TPN labs qMon/Thurs     Hughes Better, [name], BCPS Clinical Pharmacist 06/03/2015 7:52 AM

## 2015-06-04 LAB — BASIC METABOLIC PANEL
Anion gap: 9 (ref 5–15)
Anion gap: 9 (ref 5–15)
BUN: 50 mg/dL — ABNORMAL HIGH (ref 6–20)
BUN: 52 mg/dL — AB (ref 6–20)
CALCIUM: 8.5 mg/dL — AB (ref 8.9–10.3)
CHLORIDE: 105 mmol/L (ref 101–111)
CHLORIDE: 109 mmol/L (ref 101–111)
CO2: 23 mmol/L (ref 22–32)
CO2: 25 mmol/L (ref 22–32)
CREATININE: 1.19 mg/dL — AB (ref 0.44–1.00)
CREATININE: 1.22 mg/dL — AB (ref 0.44–1.00)
Calcium: 8.6 mg/dL — ABNORMAL LOW (ref 8.9–10.3)
GFR calc Af Amer: 45 mL/min — ABNORMAL LOW (ref >60)
GFR calc Af Amer: 44 mL/min — ABNORMAL LOW (ref >60)
GFR calc non Af Amer: 38 mL/min — ABNORMAL LOW (ref >60)
GFR, EST NON AFRICAN AMERICAN: 39 mL/min — AB (ref >60)
GLUCOSE: 132 mg/dL — AB (ref 65–99)
Glucose, Bld: 561 mg/dL (ref 65–99)
POTASSIUM: 3.3 mmol/L — AB (ref 3.5–5.1)
Potassium: 4.5 mmol/L (ref 3.5–5.1)
SODIUM: 137 mmol/L (ref 135–145)
Sodium: 143 mmol/L (ref 135–145)

## 2015-06-04 LAB — GLUCOSE, CAPILLARY
GLUCOSE-CAPILLARY: 114 mg/dL — AB (ref 65–99)
GLUCOSE-CAPILLARY: 150 mg/dL — AB (ref 65–99)
Glucose-Capillary: 108 mg/dL — ABNORMAL HIGH (ref 65–99)
Glucose-Capillary: 136 mg/dL — ABNORMAL HIGH (ref 65–99)
Glucose-Capillary: 131 mg/dL — ABNORMAL HIGH (ref 65–99)

## 2015-06-04 LAB — CBC
HEMATOCRIT: 29 % — AB (ref 36.0–46.0)
Hemoglobin: 8.8 g/dL — ABNORMAL LOW (ref 12.0–15.0)
MCH: 25.5 pg — ABNORMAL LOW (ref 26.0–34.0)
MCHC: 30.3 g/dL (ref 30.0–36.0)
MCV: 84.1 fL (ref 78.0–100.0)
Platelets: 249 10*3/uL (ref 150–400)
RBC: 3.45 MIL/uL — ABNORMAL LOW (ref 3.87–5.11)
RDW: 16.7 % — AB (ref 11.5–15.5)
WBC: 11.1 10*3/uL — AB (ref 4.0–10.5)

## 2015-06-04 LAB — HEPARIN LEVEL (UNFRACTIONATED): Heparin Unfractionated: 0.31 IU/mL (ref 0.30–0.70)

## 2015-06-04 MED ORDER — TRAMADOL HCL 50 MG PO TABS
50.0000 mg | ORAL_TABLET | Freq: Two times a day (BID) | ORAL | Status: DC | PRN
  Filled 2015-06-04: qty 2

## 2015-06-04 MED ORDER — BOOST / RESOURCE BREEZE PO LIQD
1.0000 | Freq: Two times a day (BID) | ORAL | Status: DC
  Administered 2015-06-05 – 2015-06-06 (×3): 1 via ORAL

## 2015-06-04 MED ORDER — MORPHINE SULFATE (PF) 2 MG/ML IV SOLN
2.0000 mg | INTRAVENOUS | Status: DC | PRN
  Administered 2015-06-04 – 2015-06-06 (×9): 2 mg via INTRAVENOUS
  Filled 2015-06-04 (×10): qty 1

## 2015-06-04 MED ORDER — TRACE MINERALS CR-CU-MN-SE-ZN 10-1000-500-60 MCG/ML IV SOLN
INTRAVENOUS | Status: DC
  Administered 2015-06-04: 17:00:00 via INTRAVENOUS
  Filled 2015-06-04: qty 1680

## 2015-06-04 MED ORDER — FAT EMULSION 20 % IV EMUL
240.0000 mL | INTRAVENOUS | Status: DC
  Administered 2015-06-04: 240 mL via INTRAVENOUS
  Filled 2015-06-04: qty 250

## 2015-06-04 NOTE — Progress Notes (Signed)
[facility] NOTE  Pharmacy Consult for TPN Indication: Diverticulitis with perforation  Allergies  Allergen Reactions  . Anti-Inflammatory Enzyme [Nutritional Supplements] Other (See Comments)    Retains fluids and headaches  . Iodinated Diagnostic Agents Hives    HIVES 15MIN S/P IV CONTRAST INJECTION,WILL NEED 13 HR PREP FOR FUTURE INJECTIONS, ok s/p 50mg  po benadryl//a.calhoun  . Spironolactone Other (See Comments)    Hair loss  . Arthrotec [Diclofenac-Misoprostol] Other (See Comments)    unknown  . Biaxin [Clarithromycin] Other (See Comments)    unknown  . Plavix [Clopidogrel Bisulfate] Other (See Comments)    unknown    Patient Measurements: Height: 5\' 5"  (165.1 cm) (05/25/15 @ 1352) Weight: 152 lb (68.947 kg) IBW/kg (Calculated) : 57  Vital Signs: Temp: 97.7 F (36.5 C) (05/09 0400) Temp Source: Oral (05/09 0400) BP: 107/61 mmHg (05/09 0700) Pulse Rate: 99 (05/09 0700) Intake/Output from previous day: 05/08 0701 - 05/09 0700 In: 3085.2 [P.O.:240; I.V.:204; IV Piggyback:550; TPN:2041.2] Out: 2060 [Urine:1820; Drains:240] Intake/Output from this shift:    Labs:  Recent Labs  06/01/15 1117 06/02/15 0520 06/03/15 0513 06/04/15 0517  WBC  --  11.5* 15.1* 11.1*  HGB  --  9.6* 9.8* 8.8*  HCT  --  30.7* 32.2* 29.0*  PLT  --  286 283 249  INR 1.40  --   --   --      Recent Labs  06/02/15 0645 06/03/15 0512 06/03/15 0513  NA 145  --  143  K 4.2  --  3.8  CL 112*  --  111  CO2 24  --  22  GLUCOSE 194*  --  152*  BUN 36*  --  47*  CREATININE 1.10*  --  1.27*  CALCIUM 8.8*  --  8.7*  MG  --   --  1.8  PHOS  --   --  4.5  PROT  --   --  5.2*  ALBUMIN  --   --  1.8*  AST  --   --  15  ALT  --   --  13*  ALKPHOS  --   --  73  BILITOT  --   --  0.4  PREALBUMIN  --   --  17.1*  TRIG  --  75  --    Estimated Creatinine Clearance: 28.7 mL/min (by C-G formula based on Cr of 1.27).    Recent Labs  06/03/15 2324 06/04/15 0335  06/04/15 0815  GLUCAP 130* 108* 114*    Medications:  Scheduled:  . antiseptic oral rinse  7 mL Mouth Rinse q12n4p  . chlorhexidine  15 mL Mouth Rinse BID  . furosemide  80 mg Intravenous Daily  . insulin aspart  0-15 Units Subcutaneous Q4H  . lip balm   Topical BID  . metronidazole  500 mg Intravenous Q8H  . morphine   Intravenous Q4H  . pantoprazole (PROTONIX) IV  40 mg Intravenous Daily  . piperacillin-tazobactam (ZOSYN)  IV  3.375 g Intravenous Q8H  . sodium chloride flush  10-40 mL Intracatheter Q12H  . verapamil  120 mg Oral Daily    Insulin Requirements in the past 24 hours:  10 units sensitive SSI  Assessment: 82 y/o female with several recent admissions for diverticulitis admitted 05/25/2015 with diverticulitis/abscess with microperforation. She has previously been treated conservatively with antibiotics but plan is for colectomy once C diff infection is under control.  Surgeries/Procedures/Scans: 4/29 CT abd - persistent diverticulitis with possible abscess 5/4 CT abd-  diverticulitis involving sigmoid colon with abscess that is slightly larger  5/5 S/P sigmoid colectomy and descending colostomy  GI:LLQ tender, mult loose stools, abd distended.Pelvic abscess drainage decreasing, 240 cc yesterday. Albumin 1.8, prealbumin up to 17.1.  PPI-IV.  Endo: A1c 5.2, CBGs 108-140 on ssi-moderate Lytes: Na 143, K 3.8,  CoCa 10.4 (Ca-Phos < 55).  Renal: SCr 1.27 CrCl ~30 ml/min, good uop, net 1 L + yesterday Pulm: [place]-2L Cards: BP nl-high, a bit tachy.  Hepatobil: LFTs wnl, Tbili 0.48 ID: D#[id number] of Zosyn, fluconazole for diverticulitis; C diff + vancomycin PO 5/1-5/5, currently Flagyl IV while NPO AC/heme: Warfarin PTA for AFib, now on heparin gtt, cbc wnl Best Practices: SCDs  TPN Access: PICC 5/2 >> TPN start date: 5/2 >>   Nutritional Goals: Kcal 1500-1700 Protein 70-80 gm Per RD 5/2   Current Nutrition:  Clinimix E 5/15 at 70 mL/hr and IV lipids at 10  mL/hr   Plan:  -Cont Clinimix E 5/15 at 70 mL/hr and IV lipids at 10 mL/hr. This will provide 1672 Kcal and 84 gm of protein which meets 100% of nutritional goals.  -Add MVI and TE to TPN  -TPN labs qMon/Thurs    Hughes Better, [name], BCPS Clinical Pharmacist 06/04/2015 8:48 AM

## 2015-06-04 NOTE — Progress Notes (Signed)
Nutrition Follow-up  DOCUMENTATION CODES:   Non-severe (moderate) malnutrition in context of chronic illness  INTERVENTION:  -TPN per pharmacy  -Boost Breeze BID. Each supplement provides 250 kcals and 9 grams of protein.   NUTRITION DIAGNOSIS:   Inadequate oral intake related to altered GI function as evidenced by NPO status.  Ongoing  GOAL:   Patient will meet greater than or equal to 90% of their needs  Met  MONITOR:   Diet advancement, Labs, Weight trends, I & O's (TPN prescription )  ASSESSMENT:   Pt readmitted for diverticulitis w/ microperforation and colovesical fistula. S/P sigmoid colectomy and descending colostomy on 5/5.      5/2 TPN started 5/9 Diet advanced to clear liquids  Pt currently receiving Clinimix E 5/15 at 70 mL/hr and IV lipids at 10 mL/hr. This will provide 1672 Kcal and 84 gm of protein which meets 100% of nutritional goals.  Pt admission weight 124 lbs. Pt currently weighs 152 lbs. Pt 16 L+ with IV lasix.   Pt tolerating CL diet. Pt reports having juice and broth today.  Pt denies N/V and abdominal pain associated with intake.  Stool and gas output today. Pt with history of C. Diff continues to receive Flagyl. Right JP drain with 240 ml output on 5/8.  Pt amenable to receiving Boost Breeze. Will order BID.   Labs reviewed; TG 75, CBGs 108-137, elevated BUN/creat, GFR 36. Meds reviewed; Lasix 80 mg.    Diet Order:  TPN (CLINIMIX-E) Adult TPN (CLINIMIX-E) Adult Diet clear liquid Room service appropriate?: Yes; Fluid consistency:: Thin  Skin:  Reviewed, no issues  Last BM:  5/9  Height:   Ht Readings from Last 1 Encounters:  05/25/15 _0  (1.651 m)    Weight:   Wt Readings from Last 1 Encounters:  05/29/15 152 lb (68.947 kg)    Ideal Body Weight:  56.8 kg  BMI:  Body mass index is 25.29 kg/(m^2).  Estimated Nutritional Needs:   Kcal:  1500-1700  Protein:  70-80 gm  Fluid:  1.5-1.7 L  EDUCATION NEEDS:   No  education needs identified at this time  Geoffery Lyons, [place] Dietetic Intern Pager [phone]

## 2015-06-04 NOTE — Progress Notes (Signed)
Family Medicine Teaching Service Daily Progress Note Intern Pager: [phone]  Patient name: [name] Medical record number: [id number] Date of birth: [birth year] Age: 82 y.o. Gender: female  Primary Care Provider: [name], MD Consultants: ID, surgery  Code Status: Full   Pt Overview and Major Events to Date:  4/28: Readmit for Diverticulitis with possible perforation / abscess 5/5: Hartmann's procedure  Assessment and Plan: [name] is a 82 y.o. female presenting with LLQ abdominal pain. PMH is significant for atrial fibrillation, hx of CVA, hypertension, type 2 diabetes, tachycardia-bradycardia syndrome s/p pacemaker, prior pericardial effusion s/p pericardial window 12/16, sleep apnea (no CPAP), stroke (right frontal 2013), GI bleeding with hemorrhoids, and arthritis and recurrent diverticulitis.  # S/p Hartmann procedure on 5/5  following recurrent diverticulitis possibly perforation / abscess: Repeat CT Interval development of colovesical fistula between the sigmoid colon and anterior superior left bladder wall. Sigmoid mesentery abscess is mildly decreased in size. Left anterior pelvic pericolonic abscess is mildly increased in size.  Bilateral indeterminate renal lesions requiring further imaging. Slightly elevated WBC 15.1> 11.1, likely due to recent surgery - Antibiotics regiment per ID  Give her 2 weeks of zosyn post surgery (end 5-19) Continue IV flagyl til she can take po vanco  Then, at the end of 2 weeks of vanco or flagyl give her prolonged taper of po vanco 125mg  bid for 1 week 125mg  qdy for 1 week 125mg  qoday for 2 weeks.  - Pain control: D/C PCA today, morphine IV 2 mg prn  - Renal mass requiring further imaging - c/s wound care for ostomy   - Foley catheter in for a week due bladder surgical repair.   # LE edema. On Demadex 80mg  daily at home. Continues to have lower extremity edema improved.  - 1.8 L output - Daily weights  - Lasix IV 80 mg  daily  - Continue BMETs to monitor kidney fuction  - Strict I/Os  # C.difficule positive: Ostomy in place.  Having stool output. - PO vancomycin 150 mg QID (5/1 > 5/5).  - Continue IV flagyl for now - return to PO vancomycin if patient has tolerated clears today.  - ID antibiotic regiment as above  - Contact precautions   # Chronic Atrial fibrillation: Ventricular Pacemaker for AF. INR supra-therapeutic on 4/26. INR 1.57>>1.51 - Continue Verapamil and Digoxin  - Heparin per pharmacy - Restart Warfarin once patient tolerating Fulls   # DM2: Diet controlled. A1c 6.2 (12/2014)> 6.3  - monitor cbgs; SSI sensitive   # Anemia: Hgb 10.4 on admit (baseline 11). History of diverticulitis and hemorrhoids - Slight drop in hgb from 9.8 > 8.8, will monitor for now  - AM CBC   #CKD Stage 3: Baseline Cr 1-1.2  -stable   FEN/GI: Clears; SLIV, TPN  Prophylaxis: Heparin gtt  Disposition: Transfer to Med-surg  Subjective:  Patient doing well this AM. No concerns or question, discussed duration of antibiotic treatment   Objective: Temp:  [97.3 F (36.3 C)-97.7 F (36.5 C)] 97.7 F (36.5 C) (05/09 0400) Pulse Rate:  [60-145] 80 (05/09 0600) Resp:  [9-20] 11 (05/09 0600) BP: (101-152)/(41-80) 101/60 mmHg (05/09 0600) SpO2:  [95 %-98 %] 98 % (05/09 0600) Physical Exam: General: NAD, lying in bed Cardiovascular: regular rate and rhythm with 2/6 murmur, paced rhythm  Respiratory: CTAB, no increased WOB  Abdomen: Soft, colostomy bag in place with brown stool in bag, BS+  Ext: +2-3 LE edema bilaterally   Laboratory:  Recent  Labs Lab 06/02/15 0520 06/03/15 0513 06/04/15 0517  WBC 11.5* 15.1* 11.1*  HGB 9.6* 9.8* 8.8*  HCT 30.7* 32.2* 29.0*  PLT 286 283 249    Recent Labs Lab 05/28/15 0954  05/30/15 1000  06/01/15 0500 06/02/15 0645 06/03/15 0513  NA 130*  < > 132*  < > 140 145 143  K 4.5  < > 5.3*  < > 3.9 4.2 3.8  CL 91*  < > 100*  < > 106 112* 111  CO2 29  < >  22  < > 25 24 22   BUN 37*  < > 23*  < > 25* 36* 47*  CREATININE 1.72*  < > 1.05*  < > 0.97 1.10* 1.27*  CALCIUM 8.3*  < > 8.2*  < > 8.3* 8.8* 8.7*  PROT 5.5*  --  5.8*  --   --   --  5.2*  BILITOT 0.8  --  0.5  --   --   --  0.4  ALKPHOS 82  --  94  --   --   --  73  ALT 15  --  12*  --   --   --  13*  AST 17  --  21  --   --   --  15  GLUCOSE 89  < > 127*  < > 191* 194* 152*  < > = values in this interval not displayed.  Imaging/Diagnostic Tests: No results found.  Brennden Masten Cletis Media, MD 06/04/2015, 7:24 AM PGY-1, [place] Intern pager: [phone], text pages welcome

## 2015-06-04 NOTE — Progress Notes (Signed)
[facility] Progress Note  4 Days Post-Op  Subjective: Pt doing well, not much pain, no N/V, tolerating ice/water, now on clears.  Has been walking some and sitting in chair.  Pending being transferred to med-surg.  C/o edema in legs.  Thirsty/hungry.  Daughter at bedside.  Ostomy nurse came to do teaching.  Stool and gas output.   Objective: Vital signs in last 24 hours: Temp:  [97.3 F (36.3 C)-97.7 F (36.5 C)] 97.6 F (36.4 C) (05/09 0800) Pulse Rate:  [60-106] 99 (05/09 0700) Resp:  [9-20] 17 (05/09 0749) BP: (101-152)/(41-80) 107/61 mmHg (05/09 0700) SpO2:  [95 %-98 %] 98 % (05/09 0749) Last BM Date: 06/03/15  Intake/Output from previous day: 05/08 0701 - 05/09 0700 In: 3085.2 [P.O.:240; I.V.:204; IV Piggyback:550; TPN:2041.2] Out: 2060 [Urine:1820; Drains:240] Intake/Output this shift:    PE: Gen:  Alert, NAD, pleasant Card:  RRR, no M/G/R heard Pulm:  CTA, no W/R/R Abd: Soft, mild tenderness, not very distended, +BS, no HSM, incisions C/D/I with staples in place, drain with serosanguinous drainage (237mL/24hr), LLQ ostomy with stool/flatus in bag Ext:  Edema +3  Lab Results:   Recent Labs  06/03/15 0513 06/04/15 0517  WBC 15.1* 11.1*  HGB 9.8* 8.8*  HCT 32.2* 29.0*  PLT 283 249   BMET  Recent Labs  06/02/15 0645 06/03/15 0513  NA 145 143  K 4.2 3.8  CL 112* 111  CO2 24 22  GLUCOSE 194* 152*  BUN 36* 47*  CREATININE 1.10* 1.27*  CALCIUM 8.8* 8.7*   PT/INR  Recent Labs  06/01/15 1117  LABPROT 17.2*  INR 1.40   CMP     Component Value Date/Time   NA 143 06/03/2015 0513   K 3.8 06/03/2015 0513   CL 111 06/03/2015 0513   CO2 22 06/03/2015 0513   GLUCOSE 152* 06/03/2015 0513   BUN 47* 06/03/2015 0513   CREATININE 1.27* 06/03/2015 0513   CREATININE 1.32* 05/14/2015 1103   CALCIUM 8.7* 06/03/2015 0513   PROT 5.2* 06/03/2015 0513   ALBUMIN 1.8* 06/03/2015 0513   AST 15 06/03/2015 0513   ALT 13* 06/03/2015 0513   ALKPHOS 73  06/03/2015 0513   BILITOT 0.4 06/03/2015 0513   GFRNONAA 36* 06/03/2015 0513   GFRNONAA 33* 05/11/2015 1233   GFRAA 42* 06/03/2015 0513   GFRAA 38* 05/11/2015 1233   Lipase     Component Value Date/Time   LIPASE 25 05/25/2015 1639       Studies/Results: No results found.  Anti-infectives: Anti-infectives    Start     Dose/Rate Route Frequency Ordered Stop   05/31/15 2000  metroNIDAZOLE (FLAGYL) IVPB 500 mg     500 mg 100 mL/hr over 60 Minutes Intravenous Every 8 hours 05/31/15 1852     05/29/15 1200  piperacillin-tazobactam (ZOSYN) IVPB 3.375 g     3.375 g 12.5 mL/hr over 240 Minutes Intravenous Every 8 hours 05/29/15 0943     05/27/15 1200  piperacillin-tazobactam (ZOSYN) IVPB 2.25 g  Status:  Discontinued     2.25 g 100 mL/hr over 30 Minutes Intravenous Every 6 hours 05/27/15 0832 05/29/15 0943   05/27/15 1200  vancomycin (VANCOCIN) 50 mg/mL oral solution 125 mg  Status:  Discontinued     125 mg Oral Every 6 hours 05/27/15 0957 05/31/15 1852   05/27/15 1000  metroNIDAZOLE (FLAGYL) IVPB 500 mg  Status:  Discontinued     500 mg 100 mL/hr over 60 Minutes Intravenous Every 8 hours 05/27/15 0957 05/28/15  1617   05/27/15 0600  cefoTEtan (CEFOTAN) 2 g in dextrose 5 % 50 mL IVPB  Status:  Discontinued     2 g 100 mL/hr over 30 Minutes Intravenous To [facility] 05/26/15 0832 05/27/15 1051   05/26/15 1300  metroNIDAZOLE (FLAGYL) tablet 500 mg  Status:  Discontinued     500 mg Oral 3 times per day on Sun 05/26/15 0928 05/26/15 0931   05/26/15 1300  neomycin (MYCIFRADIN) tablet 1,000 mg     1,000 mg Oral 3 times per day on Sun 05/26/15 0930 05/26/15 2139   05/26/15 1300  metroNIDAZOLE (FLAGYL) tablet 1,000 mg     1,000 mg Oral 3 times per day on Sun 05/26/15 0931 05/26/15 2139   05/26/15 0832  metroNIDAZOLE (FLAGYL) tablet 500 mg  Status:  Discontinued    Comments:  Take 2 pills (=1000mg ) by mouth at 1pm, 3pm, and 10pm the day before your colorectal operation   500 mg  Oral As directed 05/26/15 [id number] 05/26/15 0925   05/26/15 0832  neomycin (MYCIFRADIN) tablet 500 mg  Status:  Discontinued     500 mg Oral As directed 05/26/15 [id number] 05/26/15 0930   05/26/15 0815  fluconazole (DIFLUCAN) IVPB 200 mg  Status:  Discontinued     200 mg 100 mL/hr over 60 Minutes Intravenous Every 24 hours 05/26/15 0811 06/03/15 1555   05/26/15 0600  piperacillin-tazobactam (ZOSYN) IVPB 3.375 g  Status:  Discontinued     3.375 g 12.5 mL/hr over 240 Minutes Intravenous Every 8 hours 05/25/15 2155 05/27/15 0832   05/25/15 1900  piperacillin-tazobactam (ZOSYN) IVPB 3.375 g     3.375 g 100 mL/hr over 30 Minutes Intravenous  Once 05/25/15 1851 05/25/15 2002       Assessment/Plan Perforated sigmoid diverticulitis with pelvic abscess and possible colovesical fistula POD#4 Sigmoid colectomy, descending colostomy, small bowel resection, placement of pelvic abscess drain--Dr. Georgette Dover -NG tube discontinued 06/03/15, tolerating clears, start PCA, watch for overmedication and sedation -WOC following -Mobilize, pt eval -Zosyn Day #7 -Leave foley in place for 7 days post op given dense adhesions of bowel to bladder and bladder repair Leukocytosis - improved to 11.1 C.Diff colitis - flagyl Day #5 Afib-Heparin gtt, watch for bleeding VTE prophylaxis-SCD/heparin gtt PCM-PICC/TPN ID-c.diff, abx per ID Dispo- Ok transfer to floor    LOS: 10 days    Nat Christen 06/04/2015, 9:42 AM Pager: [phone]  (7am - 4:30pm M-F; 7am - 11:30am Sa/Su)

## 2015-06-04 NOTE — Care Management Note (Signed)
Case Management Note  Patient Details  Name: [name] MRN: [id number] Date of Birth: [birth year]  Subjective/Objective: Pt admitted on 05/25/15 with abdominal pain s/p sigmoid colectomy, descending colostomy, small bowel resection, and placement of pelvic abscess drain on 05/31/15/  PTA, pt independent, lives with family members.                     Action/Plan: Will follow for discharge planning as pt progresses.    Expected Discharge Date:          Expected Discharge Plan:  [place]  In-House Referral:     Discharge planning Services  CM Consult  Post Acute Care Choice:    Choice offered to:     DME Arranged:    DME Agency:     HH Arranged:    [place] Agency:     Status of Service:  In process, will continue to follow  Medicare Important Message Given:    Date Medicare IM Given:    Medicare IM give by:    Date Additional Medicare IM Given:    Additional Medicare Important Message give by:     If discussed at [place] of Stay Meetings, dates discussed:    Additional Comments:  Reinaldo Raddle, RN, BSN  Trauma/Neuro ICU Case Manager [phone]

## 2015-06-04 NOTE — Progress Notes (Signed)
ANTICOAGULATION CONSULT NOTE - Follow Up Consult  Pharmacy Consult for Heparin Indication: atrial fibrillation  Allergies  Allergen Reactions  . Anti-Inflammatory Enzyme [Nutritional Supplements] Other (See Comments)    Retains fluids and headaches  . Iodinated Diagnostic Agents Hives    HIVES 15MIN S/P IV CONTRAST INJECTION,WILL NEED 13 HR PREP FOR FUTURE INJECTIONS, ok s/p 50mg  po benadryl//a.calhoun  . Spironolactone Other (See Comments)    Hair loss  . Arthrotec [Diclofenac-Misoprostol] Other (See Comments)    unknown  . Biaxin [Clarithromycin] Other (See Comments)    unknown  . Plavix [Clopidogrel Bisulfate] Other (See Comments)    unknown    Patient Measurements: Height: 5\' 5"  (165.1 cm) (05/25/15 @ 1352) Weight: 152 lb (68.947 kg) IBW/kg (Calculated) : 57 Heparin Dosing Weight: 68 kg  Vital Signs: Temp: 97.7 F (36.5 C) (05/09 0400) Temp Source: Oral (05/09 0400) BP: 101/60 mmHg (05/09 0600) Pulse Rate: 80 (05/09 0600)  Labs:  Recent Labs  06/01/15 1117  06/02/15 0520 06/02/15 0645  06/02/15 1840 06/03/15 0500 06/03/15 0513 06/04/15 0517  HGB  --   < > 9.6*  --   --   --   --  9.8* 8.8*  HCT  --   --  30.7*  --   --   --   --  32.2* 29.0*  PLT  --   --  286  --   --   --   --  283 249  LABPROT 17.2*  --   --   --   --   --   --   --   --   INR 1.40  --   --   --   --   --   --   --   --   HEPARINUNFRC  --   < >  --   --   < > 0.45 0.37  --  0.31  CREATININE  --   --   --  1.10*  --   --   --  1.27*  --   < > = values in this interval not displayed.  Estimated Creatinine Clearance: 28.7 mL/min (by C-G formula based on Cr of 1.27).  Assessment: 89 YOF on warfarin PTA for hx Afib - transitioned to heparin for planned colectomy - done on 5/5 and heparin resumed post-op.   Heparin level this evening is therapeutic (HL 0.31, aiming for lower goal of 0.3-0.5 d/t recent surgery). CBC is stable and no bleeding noted.   Goal of Therapy:  Heparin level 0.3-0.5  units/ml (due to recent surgery) Monitor platelets by anticoagulation protocol: Yes    Plan:  1. Continue Heparin at 850 units/hr (8.5 ml/hr) 2. Will continue to monitor for any signs/symptoms of bleeding and will follow up with heparin level in the a.m.   Salome Arnt, [name], BCPS Pager # [phone] 06/04/2015 7:25 AM

## 2015-06-04 NOTE — Progress Notes (Signed)
Rehab admissions - I met with patient and her oldest daughter.  They would like inpatient rehab here in the hospital.  Patient is very hard of hearing.  Will await medical readiness for possible acute inpatient rehab admission.  Call me for questions.  #[phone]

## 2015-06-04 NOTE — Consult Note (Signed)
WOC ostomy follow up Stoma type/location: end colostomy, LLQ Stomal assessment/size: 1" x 1 1/8" oval shaped, only slightly budded. Pink, moist Peristomal assessment: creasing at 3 and 9 o'clock  Treatment options for stomal/peristomal skin: added 2" barrier ring Output mucous/green effluent Ostomy pouching: 1pc. Flat ostomy pouch with 2" barrier ring Education provided: met with patient and her daughter [name].  Patient may go to live with [name] at [facility] or may go to SNF, undecided at this time.  Patient watched me change pouch and was able to open and close lock and roll closure independently, she did have some trouble with opening the spout.  [name] was able to watch the entire pouch change as well.  Answered questions about supplies and frequency of change.  Enrolled patient in [place] Start Discharge program: Yes  WOC will follow along with you for continued support with ostomy teaching and care [facility] RN,CWOCN [phone]

## 2015-06-05 LAB — BASIC METABOLIC PANEL
Anion gap: 9 (ref 5–15)
BUN: 43 mg/dL — AB (ref 6–20)
CHLORIDE: 111 mmol/L (ref 101–111)
CO2: 25 mmol/L (ref 22–32)
Calcium: 8.6 mg/dL — ABNORMAL LOW (ref 8.9–10.3)
Creatinine, Ser: 0.98 mg/dL (ref 0.44–1.00)
GFR calc Af Amer: 57 mL/min — ABNORMAL LOW (ref >60)
GFR calc non Af Amer: 49 mL/min — ABNORMAL LOW (ref >60)
GLUCOSE: 127 mg/dL — AB (ref 65–99)
POTASSIUM: 3.2 mmol/L — AB (ref 3.5–5.1)
SODIUM: 145 mmol/L (ref 135–145)

## 2015-06-05 LAB — CBC
HEMATOCRIT: 26.5 % — AB (ref 36.0–46.0)
HEMOGLOBIN: 8.5 g/dL — AB (ref 12.0–15.0)
MCH: 27 pg (ref 26.0–34.0)
MCHC: 32.1 g/dL (ref 30.0–36.0)
MCV: 84.1 fL (ref 78.0–100.0)
Platelets: 204 10*3/uL (ref 150–400)
RBC: 3.15 MIL/uL — ABNORMAL LOW (ref 3.87–5.11)
RDW: 17.1 % — ABNORMAL HIGH (ref 11.5–15.5)
WBC: 9.9 10*3/uL (ref 4.0–10.5)

## 2015-06-05 LAB — GLUCOSE, CAPILLARY
GLUCOSE-CAPILLARY: 142 mg/dL — AB (ref 65–99)
GLUCOSE-CAPILLARY: 128 mg/dL — AB (ref 65–99)
GLUCOSE-CAPILLARY: 125 mg/dL — AB (ref 65–99)
GLUCOSE-CAPILLARY: 112 mg/dL — AB (ref 65–99)
Glucose-Capillary: 103 mg/dL — ABNORMAL HIGH (ref 65–99)
Glucose-Capillary: 116 mg/dL — ABNORMAL HIGH (ref 65–99)

## 2015-06-05 LAB — HEPARIN LEVEL (UNFRACTIONATED)
Heparin Unfractionated: 0.21 IU/mL — ABNORMAL LOW (ref 0.30–0.70)
Heparin Unfractionated: 0.4 IU/mL (ref 0.30–0.70)

## 2015-06-05 MED ORDER — VANCOMYCIN 50 MG/ML ORAL SOLUTION
125.0000 mg | Freq: Four times a day (QID) | ORAL | Status: DC
  Administered 2015-06-05 – 2015-06-14 (×36): 125 mg via ORAL
  Filled 2015-06-05 (×44): qty 2.5

## 2015-06-05 NOTE — Progress Notes (Signed)
ANTICOAGULATION CONSULT NOTE - Follow Up Consult  Pharmacy Consult for Heparin  Indication: atrial fibrillation  Allergies  Allergen Reactions  . Anti-Inflammatory Enzyme [Nutritional Supplements] Other (See Comments)    Retains fluids and headaches  . Iodinated Diagnostic Agents Hives    HIVES 15MIN S/P IV CONTRAST INJECTION,WILL NEED 13 HR PREP FOR FUTURE INJECTIONS, ok s/p 50mg  po benadryl//a.calhoun  . Spironolactone Other (See Comments)    Hair loss  . Arthrotec [Diclofenac-Misoprostol] Other (See Comments)    unknown  . Biaxin [Clarithromycin] Other (See Comments)    unknown  . Plavix [Clopidogrel Bisulfate] Other (See Comments)    unknown   Patient Measurements: Height: 5\' 5"  (165.1 cm) (05/25/15 @ 1352) Weight: 164 lb (74.39 kg) IBW/kg (Calculated) : 57  Vital Signs: Temp: 98.9 F (37.2 C) (05/10 1430) Temp Source: Oral (05/10 1430) BP: 126/63 mmHg (05/10 1430) Pulse Rate: 89 (05/10 1430)  Labs:  Recent Labs  06/03/15 0513 06/04/15 0517 06/04/15 1230 06/04/15 1325 06/05/15 0430 06/05/15 1125 06/05/15 1430  HGB 9.8* 8.8*  --   --  8.5*  --   --   HCT 32.2* 29.0*  --   --  26.5*  --   --   PLT 283 249  --   --  204  --   --   HEPARINUNFRC  --  0.31  --   --  0.21*  --  0.40  CREATININE 1.27*  --  1.19* 1.22*  --  0.98  --     Estimated Creatinine Clearance: 38.5 mL/min (by C-G formula based on Cr of 0.98).  Assessment: Heparin level is sub-therapeutic this AM, Hgb low/stable x 2 days, other labs reviewed.   Repeat HL is therapeutic at 0.4 on heparin 950 units/hr. No issues with infusion or bleeding noted.  Goal of Therapy:  Heparin level 0.3-0.7 units/ml Monitor platelets by anticoagulation protocol: Yes   Plan:  Continue heparin 950 units/hr Daily HL Monitor s/sx of bleeding  [name]. Diona Foley, [name], [place] Clinical Pharmacist Pager [phone] 06/05/2015,4:01 PM

## 2015-06-05 NOTE — Care Management Note (Signed)
Case Management Note  Patient Details  Name: TYIANA MACCHIO MRN: [id number] Date of Birth: 1924/01/08  Subjective/Objective:                    Action/Plan:  UR updated  Expected Discharge Date:  05/31/15               Expected Discharge Plan:  [place]  In-House Referral:     Discharge planning Services  CM Consult  Post Acute Care Choice:    Choice offered to:     DME Arranged:    DME Agency:     HH Arranged:    HH Agency:     Status of Service:  In process, will continue to follow  Medicare Important Message Given:    Date Medicare IM Given:    Medicare IM give by:    Date Additional Medicare IM Given:    Additional Medicare Important Message give by:     If discussed at [place] of Stay Meetings, dates discussed:    Additional Comments:  Marilu Favre, RN 06/05/2015, 10:29 AM

## 2015-06-05 NOTE — Progress Notes (Signed)
ANTICOAGULATION CONSULT NOTE - Follow Up Consult  Pharmacy Consult for Heparin  Indication: atrial fibrillation  Allergies  Allergen Reactions  . Anti-Inflammatory Enzyme [Nutritional Supplements] Other (See Comments)    Retains fluids and headaches  . Iodinated Diagnostic Agents Hives    HIVES 15MIN S/P IV CONTRAST INJECTION,WILL NEED 13 HR PREP FOR FUTURE INJECTIONS, ok s/p 50mg  po benadryl//a.calhoun  . Spironolactone Other (See Comments)    Hair loss  . Arthrotec [Diclofenac-Misoprostol] Other (See Comments)    unknown  . Biaxin [Clarithromycin] Other (See Comments)    unknown  . Plavix [Clopidogrel Bisulfate] Other (See Comments)    unknown   Patient Measurements: Height: 5\' 5"  (165.1 cm) (05/25/15 @ 1352) Weight: 164 lb (74.39 kg) IBW/kg (Calculated) : 57  Vital Signs: Temp: 98.6 F (37 C) (05/10 0510) Temp Source: Oral (05/10 0510) BP: 102/52 mmHg (05/10 0510) Pulse Rate: 100 (05/10 0510)  Labs:  Recent Labs  06/03/15 0500  06/03/15 0513 06/04/15 0517 06/04/15 1230 06/04/15 1325 06/05/15 0430  HGB  --   < > 9.8* 8.8*  --   --  8.5*  HCT  --   --  32.2* 29.0*  --   --  26.5*  PLT  --   --  283 249  --   --  204  HEPARINUNFRC 0.37  --   --  0.31  --   --  0.21*  CREATININE  --   --  1.27*  --  1.19* 1.22*  --   < > = values in this interval not displayed.  Estimated Creatinine Clearance: 31 mL/min (by C-G formula based on Cr of 1.22).  Assessment: Heparin level is sub-therapeutic this AM, Hgb low/stable x 2 days, other labs reviewed.   Goal of Therapy:  Heparin level 0.3-0.7 units/ml Monitor platelets by anticoagulation protocol: Yes   Plan:  -Increase heparin to 950 units/hr -1500 HL  Brizza Nathanson 06/05/2015,7:03 AM

## 2015-06-05 NOTE — Progress Notes (Signed)
Patient ID: [name], female   DOB: 01-08-1924, 82 y.o.   MRN: [id number]     [address] [place], [place] [address]    Phone: [phone] FAX: [phone]     Subjective: 123m drain output. Vss.  Afebrile. Appetite is fair, tolerating clears.   Objective:  Vital signs:  Filed Vitals:   06/04/15 1835 06/04/15 2015 06/05/15 0500 06/05/15 0510  BP: 125/50 127/77  102/52  Pulse: 78 77  100  Temp: 98.2 F (36.8 C) 98.2 F (36.8 C)  98.6 F (37 C)  TempSrc: Oral Oral  Oral  Resp: _0 Height:      Weight:   74.39 kg (164 lb)   SpO2: 97% 97%  96%    Last BM Date: 06/04/15  Intake/Output   Yesterday:  05/09 0701 - 05/10 0700 In: 15859[P.O.:240; I.V.:85; IV Piggyback:150; TPN:800] Out: 32924[Urine:3450; Drains:195; Stool:225] This shift: I/O last 3 completed shifts: In: 2877 [P.O.:480; I.V.:187; IV Piggyback:450] Out: 5000 [Urine:4470; Drains:305; Stool:225]    Physical Exam: General: Pt awake/alert/oriented x4 in no acute distress Abdomen: Soft.  Nondistended.  llq ostomy functioning, stoma is viable.  Midline wound with staples in place.  jp drain with serosanguinous output.  No evidence of peritonitis.  No incarcerated hernias.    Problem List:   Principal Problem:   Diverticulitis of intestine with perforation and abscess Active Problems:   Chronic anticoagulation - Coumadin, CHADS2VASC=7   HTN (hypertension)   Pacemaker   Diabetes ([place]   History of CVA (cerebrovascular accident)   Tachycardia-bradycardia syndrome with symptomatic bradycardia   Chronic atrial fibrillation (HCC)   Diverticulitis   Supratherapeutic INR   CKD (chronic kidney disease) stage 3, GFR 30-59 ml/min   HOH (hard of hearing)   Protein-calorie malnutrition, moderate (HCC)   Diverticulitis of large intestine with perforation and abscess without bleeding   Status post partial colectomy   Ileus,  postoperative   Postoperative pain   Chronic diastolic congestive heart failure (HCC)   History of CVA with residual deficit   Respiratory depression   Stool culture positive for Clostridium difficile   On total parenteral nutrition (TPN)   Urinary retention   Leukocytosis   Tachycardia   Acute blood loss anemia   AKI (acute kidney injury) ([place]    Results:   Labs: Results for orders placed or performed during the hospital encounter of 05/25/15 (from the past 48 hour(s))  Glucose, capillary     Status: Abnormal   Collection Time: 06/03/15  8:02 AM  Result Value Ref Range   Glucose-Capillary 137 (H) 65 - 99 mg/dL  Glucose, capillary     Status: Abnormal   Collection Time: 06/03/15 11:19 AM  Result Value Ref Range   Glucose-Capillary 122 (H) 65 - 99 mg/dL   Comment 1 Notify RN    Comment 2 Document in Chart   Glucose, capillary     Status: Abnormal   Collection Time: 06/03/15  3:55 PM  Result Value Ref Range   Glucose-Capillary 136 (H) 65 - 99 mg/dL   Comment 1 Notify RN    Comment 2 Document in Chart   Glucose, capillary     Status: Abnormal   Collection Time: 06/03/15  7:54 PM  Result Value Ref Range   Glucose-Capillary 133 (H) 65 - 99 mg/dL  Glucose, capillary     Status: Abnormal   Collection Time: 06/03/15  11:24 PM  Result Value Ref Range   Glucose-Capillary 130 (H) 65 - 99 mg/dL  Glucose, capillary     Status: Abnormal   Collection Time: 06/04/15  3:35 AM  Result Value Ref Range   Glucose-Capillary 108 (H) 65 - 99 mg/dL  CBC     Status: Abnormal   Collection Time: 06/04/15  5:17 AM  Result Value Ref Range   WBC 11.1 (H) 4.0 - 10.5 K/uL   RBC 3.45 (L) 3.87 - 5.11 MIL/uL   Hemoglobin 8.8 (L) 12.0 - 15.0 g/dL   HCT 29.0 (L) 36.0 - 46.0 %   MCV 84.1 78.0 - 100.0 fL   MCH 25.5 (L) 26.0 - 34.0 pg   MCHC 30.3 30.0 - 36.0 g/dL   RDW 16.7 (H) 11.5 - 15.5 %   Platelets 249 150 - 400 K/uL  Heparin level (unfractionated)     Status: None   Collection Time: 06/04/15   5:17 AM  Result Value Ref Range   Heparin Unfractionated 0.31 0.30 - 0.70 IU/mL    Comment:        IF HEPARIN RESULTS ARE BELOW EXPECTED VALUES, AND PATIENT DOSAGE HAS BEEN CONFIRMED, SUGGEST FOLLOW UP TESTING OF ANTITHROMBIN III LEVELS.   Glucose, capillary     Status: Abnormal   Collection Time: 06/04/15  8:15 AM  Result Value Ref Range   Glucose-Capillary 114 (H) 65 - 99 mg/dL   Comment 1 Notify RN    Comment 2 Document in Chart   Glucose, capillary     Status: Abnormal   Collection Time: 06/04/15 11:23 AM  Result Value Ref Range   Glucose-Capillary 150 (H) 65 - 99 mg/dL   Comment 1 Notify RN    Comment 2 Document in Chart   Basic metabolic panel     Status: Abnormal   Collection Time: 06/04/15 12:30 PM  Result Value Ref Range   Sodium 137 135 - 145 mmol/L   Potassium 4.5 3.5 - 5.1 mmol/L   Chloride 105 101 - 111 mmol/L   CO2 23 22 - 32 mmol/L   Glucose, Bld 561 (HH) 65 - 99 mg/dL    Comment: CRITICAL RESULT CALLED TO, READ BACK BY AND VERIFIED WITH: K.MEYRAN,RN 1312 06/04/15 CLARK,S    BUN 50 (H) 6 - 20 mg/dL   Creatinine, Ser 1.19 (H) 0.44 - 1.00 mg/dL   Calcium 8.5 (L) 8.9 - 10.3 mg/dL   GFR calc non Af Amer 39 (L) >60 mL/min   GFR calc Af Amer 45 (L) >60 mL/min    Comment: (NOTE) The eGFR has been calculated using the CKD EPI equation. This calculation has not been validated in all clinical situations. eGFR's persistently <60 mL/min signify possible Chronic Kidney Disease.    Anion gap 9 5 - 15  Basic metabolic panel     Status: Abnormal   Collection Time: 06/04/15  1:25 PM  Result Value Ref Range   Sodium 143 135 - 145 mmol/L   Potassium 3.3 (L) 3.5 - 5.1 mmol/L   Chloride 109 101 - 111 mmol/L   CO2 25 22 - 32 mmol/L   Glucose, Bld 132 (H) 65 - 99 mg/dL   BUN 52 (H) 6 - 20 mg/dL   Creatinine, Ser 1.22 (H) 0.44 - 1.00 mg/dL   Calcium 8.6 (L) 8.9 - 10.3 mg/dL   GFR calc non Af Amer 38 (L) >60 mL/min   GFR calc Af Amer 44 (L) >60 mL/min    Comment:  (NOTE) The  eGFR has been calculated using the CKD EPI equation. This calculation has not been validated in all clinical situations. eGFR's persistently <60 mL/min signify possible Chronic Kidney Disease.    Anion gap 9 5 - 15  Glucose, capillary     Status: Abnormal   Collection Time: 06/04/15  4:47 PM  Result Value Ref Range   Glucose-Capillary 136 (H) 65 - 99 mg/dL  Glucose, capillary     Status: Abnormal   Collection Time: 06/04/15  9:15 PM  Result Value Ref Range   Glucose-Capillary 131 (H) 65 - 99 mg/dL  Glucose, capillary     Status: Abnormal   Collection Time: 06/05/15  1:42 AM  Result Value Ref Range   Glucose-Capillary 142 (H) 65 - 99 mg/dL  CBC     Status: Abnormal   Collection Time: 06/05/15  4:30 AM  Result Value Ref Range   WBC 9.9 4.0 - 10.5 K/uL   RBC 3.15 (L) 3.87 - 5.11 MIL/uL   Hemoglobin 8.5 (L) 12.0 - 15.0 g/dL   HCT 26.5 (L) 36.0 - 46.0 %   MCV 84.1 78.0 - 100.0 fL   MCH 27.0 26.0 - 34.0 pg   MCHC 32.1 30.0 - 36.0 g/dL   RDW 17.1 (H) 11.5 - 15.5 %   Platelets 204 150 - 400 K/uL  Heparin level (unfractionated)     Status: Abnormal   Collection Time: 06/05/15  4:30 AM  Result Value Ref Range   Heparin Unfractionated 0.21 (L) 0.30 - 0.70 IU/mL    Comment:        IF HEPARIN RESULTS ARE BELOW EXPECTED VALUES, AND PATIENT DOSAGE HAS BEEN CONFIRMED, SUGGEST FOLLOW UP TESTING OF ANTITHROMBIN III LEVELS.   Glucose, capillary     Status: Abnormal   Collection Time: 06/05/15  4:59 AM  Result Value Ref Range   Glucose-Capillary 128 (H) 65 - 99 mg/dL    Imaging / Studies: No results found.  Medications / Allergies:  Scheduled Meds: . antiseptic oral rinse  7 mL Mouth Rinse q12n4p  . chlorhexidine  15 mL Mouth Rinse BID  . feeding supplement  1 Container Oral BID BM  . furosemide  80 mg Intravenous Daily  . insulin aspart  0-15 Units Subcutaneous Q4H  . lip balm   Topical BID  . metronidazole  500 mg Intravenous Q8H  . pantoprazole (PROTONIX) IV  40  mg Intravenous Daily  . piperacillin-tazobactam (ZOSYN)  IV  3.375 g Intravenous Q8H  . sodium chloride flush  10-40 mL Intracatheter Q12H  . verapamil  120 mg Oral Daily   Continuous Infusions: . Marland KitchenTPN (CLINIMIX-E) Adult 70 mL/hr at 06/04/15 1712   And  . fat emulsion 240 mL (06/04/15 1712)  . heparin 950 Units/hr (06/05/15 0723)  . lactated ringers 0 mL/hr at 05/31/15 1159   PRN Meds:.acetaminophen, acetaminophen, alum & mag hydroxide-simeth, benzocaine-Menthol, magic mouthwash, menthol-cetylpyridinium, metoprolol, morphine injection, ondansetron **OR** ondansetron (ZOFRAN) IV, prochlorperazine, silver sulfADIAZINE, sodium chloride flush, sodium chloride flush, traMADol  Antibiotics: Anti-infectives    Start     Dose/Rate Route Frequency Ordered Stop   05/31/15 2000  metroNIDAZOLE (FLAGYL) IVPB 500 mg     500 mg 100 mL/hr over 60 Minutes Intravenous Every 8 hours 05/31/15 1852     05/29/15 1200  piperacillin-tazobactam (ZOSYN) IVPB 3.375 g     3.375 g 12.5 mL/hr over 240 Minutes Intravenous Every 8 hours 05/29/15 0943     05/27/15 1200  piperacillin-tazobactam (ZOSYN) IVPB 2.25 g  Status:  Discontinued     2.25 g 100 mL/hr over 30 Minutes Intravenous Every 6 hours 05/27/15 0832 05/29/15 0943   05/27/15 1200  vancomycin (VANCOCIN) 50 mg/mL oral solution 125 mg  Status:  Discontinued     125 mg Oral Every 6 hours 05/27/15 0957 05/31/15 1852   05/27/15 1000  metroNIDAZOLE (FLAGYL) IVPB 500 mg  Status:  Discontinued     500 mg 100 mL/hr over 60 Minutes Intravenous Every 8 hours 05/27/15 0957 05/28/15 1617   05/27/15 0600  cefoTEtan (CEFOTAN) 2 g in dextrose 5 % 50 mL IVPB  Status:  Discontinued     2 g 100 mL/hr over 30 Minutes Intravenous To [facility] 05/26/15 0832 05/27/15 1051   05/26/15 1300  metroNIDAZOLE (FLAGYL) tablet 500 mg  Status:  Discontinued     500 mg Oral 3 times per day on Sun 05/26/15 0928 05/26/15 0931   05/26/15 1300  neomycin (MYCIFRADIN) tablet 1,000 mg      1,000 mg Oral 3 times per day on Sun 05/26/15 0930 05/26/15 2139   05/26/15 1300  metroNIDAZOLE (FLAGYL) tablet 1,000 mg     1,000 mg Oral 3 times per day on Sun 05/26/15 0931 05/26/15 2139   05/26/15 0832  metroNIDAZOLE (FLAGYL) tablet 500 mg  Status:  Discontinued    Comments:  Take 2 pills (=1044m) by mouth at 1pm, 3pm, and 10pm the day before your colorectal operation   500 mg Oral As directed 05/26/15 0832 05/26/15 0925   05/26/15 0832  neomycin (MYCIFRADIN) tablet 500 mg  Status:  Discontinued     500 mg Oral As directed 05/26/15 0630104/30/17 0930   05/26/15 0815  fluconazole (DIFLUCAN) IVPB 200 mg  Status:  Discontinued     200 mg 100 mL/hr over 60 Minutes Intravenous Every 24 hours 05/26/15 0811 06/03/15 1555   05/26/15 0600  piperacillin-tazobactam (ZOSYN) IVPB 3.375 g  Status:  Discontinued     3.375 g 12.5 mL/hr over 240 Minutes Intravenous Every 8 hours 05/25/15 2155 05/27/15 0832   05/25/15 1900  piperacillin-tazobactam (ZOSYN) IVPB 3.375 g     3.375 g 100 mL/hr over 30 Minutes Intravenous  Once 05/25/15 1851 05/25/15 2002       Assessment/Plan Perforated sigmoid diverticulitis with pelvic abscess and possible colovesical fistula POD#5 Sigmoid colectomy, descending colostomy, small bowel resection, placement of pelvic abscess drain--Dr. TGeorgette Dover-advance to fulls.  Encourage PO pain meds, could consider scheduled tylenol -WOC following -DC staples on POD#14 -Mobilize, pt eval -Zosyn Day #8/10 -Leave foley in place for 7 days post op given dense adhesions of bowel to bladder and bladder repair C.Diff colitis - flagyl Day #6 Afib-Heparin gtt, can transition to PO tomorrow PCM-wean off tpn, may help stimulate appetite.  Dispo- not ready yet for DC, CIR   EErby Pian ANP-BC [facility] Pager [phone](7A-4:30P) For consults and floor pages call [phone](7A-4:30P)  06/05/2015 8:00 AM

## 2015-06-05 NOTE — Discharge Summary (Deleted)
[facility] Discharge Summary  Patient name: [name] Medical record number: [id number] Date of birth: 1923/04/18 Age: 82 y.o. Gender: female Date of Admission: 05/25/2015  Date of Discharge: 06/06/2015 Admitting Physician: Alveda Reasons, MD  Primary Care Provider: [name], MD Consultants: ID, Surgery  Indication for Hospitalization: Diverticulitis with abscess and microperforation    Discharge Diagnoses/Problem List:  Patient Active Problem List   Diagnosis Date Noted  . Chronic diastolic congestive heart failure ([place])   . History of CVA with residual deficit   . Respiratory depression   . Stool culture positive for Clostridium difficile   . On total parenteral nutrition (TPN)   . Urinary retention   . Leukocytosis   . Tachycardia   . Acute blood loss anemia   . AKI (acute kidney injury) ([place])   . Status post partial colectomy   . Ileus, postoperative   . Postoperative pain   . Diverticulitis of large intestine with perforation and abscess without bleeding   . CKD (chronic kidney disease) stage 3, GFR 30-59 ml/min 05/26/2015  . HOH (hard of hearing) 05/26/2015  . Protein-calorie malnutrition, moderate ([place]) 05/26/2015  . Diverticulitis of intestine with perforation and abscess 05/25/2015  . Diverticulosis   . Chronic thoracic back pain   . Acute on chronic diastolic heart failure ([place])   . Perforation of intestine due to diverticulitis of gastrointestinal tract ([place])   . SOB (shortness of breath) 05/01/2015  . Supratherapeutic INR   . Paroxysmal atrial fibrillation (HCC)   . Essential hypertension, benign   . Left sided abdominal pain   . Diverticulitis 03/08/2015  . Obesity   . Chronic atrial fibrillation ([place])   . Upper airway cough syndrome   . Dyspnea 05/22/2014  . Tachycardia-bradycardia syndrome with symptomatic bradycardia 01/03/2014  . History of CVA (cerebrovascular accident) 10/25/2013  . Diabetes ([place]) 08/03/2013   . Myofacial muscle pain 10/16/2011  . Back pain, Left Flank 07/01/2011  . Cerebral embolism with cerebral infarction ([place]) 06/12/2011  . CVA (cerebral infarction) 05/20/2011  . Weakness of left side of body 05/16/2011  . Elevated digoxin level 05/16/2011  . Pacemaker 05/21/2010  . Edema 04/24/2010  . Pericardial effusion, Large   . Chronic anticoagulation - Coumadin, CHADS2VASC=7   . HTN (hypertension)     Disposition: CIR   Discharge Condition: Stable   Discharge Exam:  General: NAD, lying in bed Cardiovascular: regular rate and rhythm with 2/6 murmur, paced rhythm  Respiratory: CTAB, no increased WOB  Abdomen: Soft, colostomy bag in place with brown stool in bag, BS+  Ext: +2 LE edema bilaterally, mid calf   Brief Hospital Course:  [name] is 82 y.o. presenting with diverticulitis, with several other previous admission for diverticulitis in the past. On this, admission, CT was significant for recurrent diverticulitis with possible perforation and abscess formation. Surgery was consulted for possible colectomy, however patient was found to have C. Difficile and therefore surgery wanted to hold off on until infection was treated. Patient's INR was elevated at the time, and therefore   They recommended starting TPN and continuing antibiotics. ID was consulted for duration and coverage. Per ID, patient was continue on Zosyn, Diflucan, and PO vancomycin prior to surgery. Repeat CT on the 5/4 showed l development of colovesical fistula between the sigmoid colon and anterior superior left bladder wall, and increasing abscess formation. Therefore, on 5/5 surgery performed a Hartman's procedure as well as dissection and repair of the bladder. Following  surgery, over the next several day's patient was slowly reintroduced to po intake and TPN was stopped. ID was consulted for duration of antibiotics post surgery and indicated continuing zosyn until 5/19 and vancomycin PO with a 4 week  taper. Wound was consulted to help train patient and family how to change and care for the ostomy.  Patient was transitioned to PO medications and felt stable for discharge to CIR.    Issues for Follow Up:  1. Per Repeat CT on 5/4 patient with Bilateral indeterminate renal lesions for which renal mass protocol indicates IV contrast CT as an outpatient once patient 2. Patient needs to continue Zosyn q8hrs via PICC line until 5/19 3.  Patient needs to take PO vancomycin 125 mg QID until 5/19 and then per ID patient needs a taper of vancomycin 125mg  bid for 1 week, 125mg  qd for 1 week, 125mg  every other day for 2 weeks 4. Percocet 5-10 mg available at discharge for pain.  5. Wafarin restarted today, will need to continue to titrate warfarin as need, continue to check INR 6. Continue to check BMETs for the next couple days for serum creatinine    Significant Procedures:  Hartman's Procedure - OP Note on 5/52017 by Dr. Georgette Dover   Significant Labs and Imaging:   Recent Labs Lab 06/04/15 0517 06/05/15 0430 06/06/15 0344  WBC 11.1* 9.9 10.4  HGB 8.8* 8.5* 9.0*  HCT 29.0* 26.5* 27.6*  PLT 249 204 254    Recent Labs Lab 05/31/15 0420 06/01/15 0500  06/03/15 0513 06/04/15 1230 06/04/15 1325 06/05/15 1125 06/06/15 0344  NA 136 140  < > 143 137 143 145 146*  K 3.9 3.9  < > 3.8 4.5 3.3* 3.2* 3.2*  CL 102 106  < > 111 105 109 111 109  CO2 23 25  < > 22 23 25 25 27   GLUCOSE 112* 191*  < > 152* 561* 132* 127* 144*  BUN 22* 25*  < > 47* 50* 52* 43* 37*  CREATININE 0.90 0.97  < > 1.27* 1.19* 1.22* 0.98 1.14*  CALCIUM 8.0* 8.3*  < > 8.7* 8.5* 8.6* 8.6* 8.7*  MG  --  1.7  --  1.8  --   --   --  1.8  PHOS 2.6 2.9  --  4.5  --   --   --  4.4  ALKPHOS  --   --   --  73  --   --   --  71  AST  --   --   --  15  --   --   --  22  ALT  --   --   --  13*  --   --   --  11*  ALBUMIN  --   --   --  1.8*  --   --   --  1.7*  < > = values in this interval not displayed.  Results/Tests Pending at Time  of Discharge:  None   Discharge Medications:    Medication List    ASK your doctor about these medications        amoxicillin-clavulanate 500-125 MG tablet  Commonly known as:  AUGMENTIN  Take 1 tablet (500 mg total) by mouth every 12 (twelve) hours. Start tonight.     Digoxin 62.5 MCG Tabs  Take 0.0625 mg by mouth daily. Take only 5 days a week; skip Wednesdays and Sundays     gabapentin 100 MG capsule  Commonly known  as:  NEURONTIN  Take 1 capsule (100 mg total) by mouth 3 (three) times daily.     metolazone 2.5 MG tablet  Commonly known as:  ZAROXOLYN  Take 1 tablet (2.5 mg total) by mouth daily.     ondansetron 4 MG disintegrating tablet  Commonly known as:  ZOFRAN ODT  Take 1 tablet (4 mg total) by mouth every 8 (eight) hours as needed for nausea or vomiting.     oxyCODONE 5 MG immediate release tablet  Commonly known as:  ROXICODONE  Take one half tablet in the morning as needed for back pain.     potassium chloride 10 MEQ tablet  Commonly known as:  K-DUR,KLOR-CON  Take 2 tablets (20 mEq total) by mouth 2 (two) times daily. Take with Torsemide     torsemide 20 MG tablet  Commonly known as:  DEMADEX  Take 4 tablets (80 mg total) by mouth daily.     verapamil 180 MG CR tablet  Commonly known as:  CALAN-SR  take 1 tablet by mouth at bedtime     warfarin 5 MG tablet  Commonly known as:  COUMADIN  Take 2.5-5 mg by mouth as directed. Takes 0.5 tablet (2.5 mg total) by mouth Monday, Wednesday, Friday, Saturday. Take 1 tablet (5 mg total) by mouth Sunday, Tuesday, Thursday.        Discharge Instructions: Please refer to Patient Instructions section of EMR for full details.  Patient was counseled important signs and symptoms that should prompt return to medical care, changes in medications, dietary instructions, activity restrictions, and follow up appointments.   Follow-Up Appointments: Follow-up Information    Follow up with TSUEI,MATTHEW K., MD. Go in 3 weeks.    Specialty:  General Surgery   Contact information:   [address] [place] [address] [phone]       Tonette Bihari, MD 06/06/2015, 1:11 PM PGY-1, [place]

## 2015-06-05 NOTE — Progress Notes (Signed)
Family Medicine Teaching Service Daily Progress Note Intern Pager: [phone]  Patient name: [name] Medical record number: [id number] Date of birth: [birth year] Age: 82 y.o. Gender: female  Primary Care Provider: [name], MD Consultants: ID, surgery  Code Status: Full   Pt Overview and Major Events to Date:  4/28: Readmit for Diverticulitis with possible perforation / abscess 5/5: Hartmann's procedure  Assessment and Plan: [name] is a 81 y.o. female presenting with LLQ abdominal pain. PMH is significant for atrial fibrillation, hx of CVA, hypertension, type 2 diabetes, tachycardia-bradycardia syndrome s/p pacemaker, prior pericardial effusion s/p pericardial window 12/16, sleep apnea (no CPAP), stroke (right frontal 2013), GI bleeding with hemorrhoids, and arthritis and recurrent diverticulitis.  # S/p Hartmann procedure on 5/5  following recurrent diverticulitis possibly perforation / abscess: Repeat CT Interval development of colovesical fistula between the sigmoid colon and anterior superior left bladder wall. Sigmoid mesentery abscess is mildly decreased in size. Left anterior pelvic pericolonic abscess is mildly increased in size.  Bilateral indeterminate renal lesions requiring further imaging. Slightly elevated WBC 15.1> 11.1>9.9, likely due to recent surgery - Antibiotics regiment per ID  2 weeks of zosyn post surgery (end 5-19) Vancomycin 125 mg PO q6hrs ( end 5/19)  Then, at the end of 2 weeks prolonged taper of po vanco 125mg  bid for 1 week 125mg  qdy for 1 week 125mg  qoday for 2 weeks.  - Pain control morphine IV 2 mg prn  (5x times)  - Renal mass requiring further imaging - c/s wound care for ostomy   - Foley catheter in for a week due bladder surgical repair, last day 5/11  # LE edema. On Demadex 80 mg daily at home. Continues to have lower extremity edema improved.  - 3.8 L output - Daily weights  - Lasix IV 60 mg daily --> Lasix 40 IV daily  -  Continue BMETs to monitor kidney fuction  - Strict I/Os  # C.difficule positive: Ostomy in place.  Having stool output. - PO vancomycin 150 mg QID (5/1 > 5/5),  IV flagyl 5/6- 5/9,  - PO vancomycin 150 mg QID (5/10>>), regiment as above  - Contact precautions   # Chronic Atrial fibrillation: Ventricular Pacemaker for AF. INR supra-therapeutic on 4/26. INR 1.57>>1.51 - Continue Verapamil and Digoxin  - Heparin per pharmacy - Restart Warfarin tomorrow   # DM2: Diet controlled. A1c 6.2 (12/2014)> 6.3  - monitor cbgs; SSI sensitive   # Anemia: Hgb 10.4 on admit (baseline 11). History of diverticulitis and hemorrhoids - Slight drop in hgb from 9.8 > 8.8> 8.5, stable  - AM CBC   #CKD Stage 3: Baseline Cr 1-1.2  -stable   FEN/GI: Fulls; SLIV, d/c TPN  Prophylaxis: Heparin gtt  Disposition: Med-surg, CIR possibly tomorrow   Subjective:  Patient doing well this AM. Eating breakfast.   Objective: Temp:  [97.5 F (36.4 C)-98.6 F (37 C)] 98.6 F (37 C) (05/10 0510) Pulse Rate:  [30-130] 100 (05/10 0510) Resp:  [13-27] 20 (05/10 0510) BP: (98-144)/(39-80) 102/52 mmHg (05/10 0510) SpO2:  [94 %-97 %] 96 % (05/10 0510) Weight:  [164 lb (74.39 kg)] 164 lb (74.39 kg) (05/10 0500) Physical Exam: General: NAD, lying in bed Cardiovascular: regular rate and rhythm with 2/6 murmur, paced rhythm  Respiratory: CTAB, no increased WOB  Abdomen: Soft, colostomy bag in place with brown stool in bag, BS+  Ext: +2 LE edema bilaterally, mid calf    Laboratory:  Recent Labs Lab 06/03/15 0513  06/04/15 0517 06/05/15 0430  WBC 15.1* 11.1* 9.9  HGB 9.8* 8.8* 8.5*  HCT 32.2* 29.0* 26.5*  PLT 283 249 204    Recent Labs Lab 05/30/15 1000  06/03/15 0513 06/04/15 1230 06/04/15 1325  NA 132*  < > 143 137 143  K 5.3*  < > 3.8 4.5 3.3*  CL 100*  < > 111 105 109  CO2 22  < > 22 23 25   BUN 23*  < > 47* 50* 52*  CREATININE 1.05*  < > 1.27* 1.19* 1.22*  CALCIUM 8.2*  < > 8.7* 8.5*  8.6*  PROT 5.8*  --  5.2*  --   --   BILITOT 0.5  --  0.4  --   --   ALKPHOS 94  --  73  --   --   ALT 12*  --  13*  --   --   AST 21  --  15  --   --   GLUCOSE 127*  < > 152* 561* 132*  < > = values in this interval not displayed.  Imaging/Diagnostic Tests: No results found.  [name] Cletis Media, MD 06/05/2015, 9:32 AM PGY-1, [place] Intern pager: [phone], text pages welcome

## 2015-06-06 ENCOUNTER — Inpatient Hospital Stay (HOSPITAL_COMMUNITY): Payer: Medicare Other

## 2015-06-06 LAB — CBC
HCT: 27.6 % — ABNORMAL LOW (ref 36.0–46.0)
HEMOGLOBIN: 9 g/dL — AB (ref 12.0–15.0)
MCH: 26.7 pg (ref 26.0–34.0)
MCHC: 32.6 g/dL (ref 30.0–36.0)
MCV: 81.9 fL (ref 78.0–100.0)
Platelets: 254 10*3/uL (ref 150–400)
RBC: 3.37 MIL/uL — ABNORMAL LOW (ref 3.87–5.11)
RDW: 17.4 % — AB (ref 11.5–15.5)
WBC: 10.4 10*3/uL (ref 4.0–10.5)

## 2015-06-06 LAB — GLUCOSE, CAPILLARY
GLUCOSE-CAPILLARY: 131 mg/dL — AB (ref 65–99)
Glucose-Capillary: 109 mg/dL — ABNORMAL HIGH (ref 65–99)
Glucose-Capillary: 117 mg/dL — ABNORMAL HIGH (ref 65–99)
Glucose-Capillary: 123 mg/dL — ABNORMAL HIGH (ref 65–99)
Glucose-Capillary: 127 mg/dL — ABNORMAL HIGH (ref 65–99)
Glucose-Capillary: 150 mg/dL — ABNORMAL HIGH (ref 65–99)

## 2015-06-06 LAB — URINALYSIS, ROUTINE W REFLEX MICROSCOPIC
BILIRUBIN URINE: NEGATIVE
Glucose, UA: NEGATIVE mg/dL
HGB URINE DIPSTICK: NEGATIVE
KETONES UR: NEGATIVE mg/dL
Leukocytes, UA: NEGATIVE
NITRITE: NEGATIVE
Protein, ur: NEGATIVE mg/dL
Specific Gravity, Urine: 1.018 (ref 1.005–1.030)
pH: 5 (ref 5.0–8.0)

## 2015-06-06 LAB — COMPREHENSIVE METABOLIC PANEL
ALBUMIN: 1.7 g/dL — AB (ref 3.5–5.0)
ALK PHOS: 71 U/L (ref 38–126)
ALT: 11 U/L — AB (ref 14–54)
AST: 22 U/L (ref 15–41)
Anion gap: 10 (ref 5–15)
BUN: 37 mg/dL — ABNORMAL HIGH (ref 6–20)
CALCIUM: 8.7 mg/dL — AB (ref 8.9–10.3)
CO2: 27 mmol/L (ref 22–32)
CREATININE: 1.14 mg/dL — AB (ref 0.44–1.00)
Chloride: 109 mmol/L (ref 101–111)
GFR calc non Af Amer: 41 mL/min — ABNORMAL LOW (ref >60)
GFR, EST AFRICAN AMERICAN: 48 mL/min — AB (ref >60)
GLUCOSE: 144 mg/dL — AB (ref 65–99)
Potassium: 3.2 mmol/L — ABNORMAL LOW (ref 3.5–5.1)
SODIUM: 146 mmol/L — AB (ref 135–145)
TOTAL PROTEIN: 4.9 g/dL — AB (ref 6.5–8.1)
Total Bilirubin: 0.6 mg/dL (ref 0.3–1.2)

## 2015-06-06 LAB — PHOSPHORUS: PHOSPHORUS: 4.4 mg/dL (ref 2.5–4.6)

## 2015-06-06 LAB — MAGNESIUM: MAGNESIUM: 1.8 mg/dL (ref 1.7–2.4)

## 2015-06-06 LAB — HEPARIN LEVEL (UNFRACTIONATED): HEPARIN UNFRACTIONATED: 0.68 [IU]/mL (ref 0.30–0.70)

## 2015-06-06 MED ORDER — TORSEMIDE 20 MG PO TABS: 40.0000 mg | ORAL_TABLET | Freq: Every day | ORAL | Status: DC

## 2015-06-06 MED ORDER — WARFARIN - PHARMACIST DOSING INPATIENT: Freq: Every day | Status: DC

## 2015-06-06 MED ORDER — VANCOMYCIN 50 MG/ML ORAL SOLUTION: ORAL | Status: DC

## 2015-06-06 MED ORDER — ENSURE ENLIVE PO LIQD
237.0000 mL | Freq: Two times a day (BID) | ORAL | Status: DC
  Administered 2015-06-06 – 2015-06-07 (×2): 237 mL via ORAL

## 2015-06-06 MED ORDER — PANTOPRAZOLE SODIUM 40 MG PO TBEC
40.0000 mg | DELAYED_RELEASE_TABLET | Freq: Every day | ORAL | Status: DC
  Administered 2015-06-07 – 2015-06-14 (×8): 40 mg via ORAL
  Filled 2015-06-06 (×8): qty 1

## 2015-06-06 MED ORDER — OXYCODONE-ACETAMINOPHEN 5-325 MG PO TABS: 1.0000 | ORAL_TABLET | ORAL | Status: DC | PRN

## 2015-06-06 MED ORDER — PIPERACILLIN-TAZOBACTAM 3.375 G IVPB: 3.3750 g | Freq: Three times a day (TID) | INTRAVENOUS | Status: DC

## 2015-06-06 MED ORDER — POTASSIUM CHLORIDE CRYS ER 20 MEQ PO TBCR
40.0000 meq | EXTENDED_RELEASE_TABLET | Freq: Every day | ORAL | Status: DC
  Administered 2015-06-06 – 2015-06-08 (×3): 40 meq via ORAL
  Filled 2015-06-06 (×3): qty 2

## 2015-06-06 MED ORDER — PANTOPRAZOLE SODIUM 40 MG PO TBEC: 40.0000 mg | DELAYED_RELEASE_TABLET | Freq: Every day | ORAL | Status: DC

## 2015-06-06 MED ORDER — TORSEMIDE 20 MG PO TABS
80.0000 mg | ORAL_TABLET | Freq: Every day | ORAL | Status: DC
  Administered 2015-06-06: 80 mg via ORAL
  Filled 2015-06-06: qty 4

## 2015-06-06 MED ORDER — WARFARIN SODIUM 5 MG PO TABS
5.0000 mg | ORAL_TABLET | Freq: Once | ORAL | Status: AC
  Administered 2015-06-06: 5 mg via ORAL
  Filled 2015-06-06: qty 1

## 2015-06-06 MED ORDER — VERAPAMIL HCL ER 120 MG PO TBCR: 120.0000 mg | EXTENDED_RELEASE_TABLET | Freq: Every day | ORAL | Status: DC

## 2015-06-06 NOTE — Progress Notes (Signed)
Rehab admissions - I have clearance from attending MD team to admit to inpatient rehab today.  Bed available and will admit to rehab today.  Call me for questions.  RC:[phone]

## 2015-06-06 NOTE — Progress Notes (Signed)
Pt family was so concern about pt condition accdg to them she was alert and active , eats well , make some jokes but now she look lethargic, colostomy output with coffee ground, no appetite and the plan is to discharge her tom and transfer to rehab, Dr. Georganna Skeans informed and spoke to the family and he ordered cbc, cmp and chest x-ray, also discontinue transfer to rehab will evaluate tom with MD, family agreed for the plan, will continue to monitor.

## 2015-06-06 NOTE — Discharge Summary (Signed)
[facility] Discharge Summary  Patient name: [name] Ngo JessupMedical record number: [id number] Date of birth: February 20, 1925Age: 82 y.o.Gender: female Date of Admission: 4/29/2017Date of Discharge: 06/06/2015 Admitting Physician: Alveda Reasons, MD  Primary Care Provider: [name], MD Consultants: ID, Surgery  Indication for Hospitalization: Diverticulitis with abscess and microperforation   Discharge Diagnoses/Problem List:  Patient Active Problem List   Diagnosis Date Noted  . Chronic diastolic congestive heart failure ([place])   . History of CVA with residual deficit   . Respiratory depression   . Stool culture positive for Clostridium difficile   . On total parenteral nutrition (TPN)   . Urinary retention   . Leukocytosis   . Tachycardia   . Acute blood loss anemia   . AKI (acute kidney injury) ([place])   . Status post partial colectomy   . Ileus, postoperative   . Postoperative pain   . Diverticulitis of large intestine with perforation and abscess without bleeding   . CKD (chronic kidney disease) stage 3, GFR 30-59 ml/min 05/26/2015  . HOH (hard of hearing) 05/26/2015  . Protein-calorie malnutrition, moderate ([place]) 05/26/2015  . Diverticulitis of intestine with perforation and abscess 05/25/2015  . Diverticulosis   . Chronic thoracic back pain   . Acute on chronic diastolic heart failure ([place])   . Perforation of intestine due to diverticulitis of gastrointestinal tract ([place])   . SOB (shortness of breath) 05/01/2015  . Supratherapeutic INR   . Paroxysmal atrial fibrillation ([facility])   . Essential hypertension, benign   . Left sided abdominal pain   . Diverticulitis 03/08/2015  . Obesity   . Chronic atrial fibrillation ([place])   . Upper airway cough syndrome   . Dyspnea 05/22/2014  . Tachycardia-bradycardia  syndrome with symptomatic bradycardia 01/03/2014  . History of CVA (cerebrovascular accident) 10/25/2013  . Diabetes ([place]) 08/03/2013  . Myofacial muscle pain 10/16/2011  . Back pain, Left Flank 07/01/2011  . Cerebral embolism with cerebral infarction ([place]) 06/12/2011  . CVA (cerebral infarction) 05/20/2011  . Weakness of left side of body 05/16/2011  . Elevated digoxin level 05/16/2011  . Pacemaker 05/21/2010  . Edema 04/24/2010  . Pericardial effusion, Large   . Chronic anticoagulation - Coumadin, CHADS2VASC=7   . HTN (hypertension)     Disposition: CIR   Discharge Condition: Stable   Discharge Exam:  General: NAD, lying in bed Cardiovascular: regular rate and rhythm with 2/6 murmur, paced rhythm  Respiratory: CTAB, no increased WOB  Abdomen: Soft, colostomy bag in place with brown stool in bag, BS+  Ext: +2 LE edema bilaterally, mid calf   Brief Hospital Course:  [name] is 82 y.o. presenting with diverticulitis, with several other previous admission for diverticulitis in the past. On this, admission, CT was significant for recurrent diverticulitis with possible perforation and abscess formation. Surgery was consulted for possible colectomy, however patient was found to have C. Difficile and therefore surgery wanted to hold off on until infection was treated. Patient's INR was elevated at the time, and therefore They recommended starting TPN and continuing antibiotics. ID was consulted for duration and coverage. Per ID, patient was continue on Zosyn, Diflucan, and PO vancomycin prior to surgery. Repeat CT on the 5/4 showed l development of colovesical fistula between the sigmoid colon and anterior superior left bladder wall, and increasing abscess formation. Therefore, on 5/5 surgery performed a Hartman's procedure as well as dissection and repair of the bladder. Following surgery, over the next several day's patient was  slowly  reintroduced to po intake and TPN was stopped. ID was consulted for duration of antibiotics post surgery and indicated continuing zosyn until 5/19 and vancomycin PO with a 4 week taper. Wound was consulted to help train patient and family how to change and care for the ostomy. Patient was transitioned to PO medications and felt stable for discharge to CIR.   Issues for Follow Up:  1. Per Repeat CT on 5/4 patient with Bilateral indeterminate renal lesions for which renal mass protocol indicates IV contrast CT as an outpatient once patient 2. Patient needs to continue Zosyn q8hrs via PICC line until 5/19 3. Patient needs to take PO vancomycin 125 mg QID until 5/19 and then per ID patient needs a taper of vancomycin 125mg  bid for 1 week, 125mg  qd for 1 week, 125mg  every other day for 2 weeks 4. Percocet 5-10 mg available at discharge for pain.  5. Wafarin restarted today, will need to continue to titrate warfarin as need, continue to check INR 6. Continue to check BMETs for the next couple days for serum creatinine   Significant Procedures:  Hartman's Procedure - OP Note on 5/52017 by Dr. Georgette Dover   Significant Labs and Imaging:   Last Labs      Recent Labs Lab 06/04/15 0517 06/05/15 0430 06/06/15 0344  WBC 11.1* 9.9 10.4  HGB 8.8* 8.5* 9.0*  HCT 29.0* 26.5* 27.6*  PLT 249 204 254      Last Labs      Recent Labs Lab 05/31/15 0420 06/01/15 0500  06/03/15 0513 06/04/15 1230 06/04/15 1325 06/05/15 1125 06/06/15 0344  NA 136 140 < > 143 137 143 145 146*  K 3.9 3.9 < > 3.8 4.5 3.3* 3.2* 3.2*  CL 102 106 < > 111 105 109 111 109  CO2 23 25 < > 22 23 25 25 27   GLUCOSE 112* 191* < > 152* 561* 132* 127* 144*  BUN 22* 25* < > 47* 50* 52* 43* 37*  CREATININE 0.90 0.97 < > 1.27* 1.19* 1.22* 0.98 1.14*  CALCIUM 8.0* 8.3* < > 8.7* 8.5* 8.6* 8.6* 8.7*  MG --  1.7 --   1.8 --  --  --  1.8  PHOS 2.6 2.9 --  4.5 --  --  --  4.4  ALKPHOS --  --  --  73 --  --  --  71  AST --  --  --  15 --  --  --  22  ALT --  --  --  13* --  --  --  11*  ALBUMIN --  --  --  1.8* --  --  --  1.7*  < > = values in this interval not displayed.    Results/Tests Pending at Time of Discharge: None       Discharge Medications:    Medication List    STOP taking these medications        amoxicillin-clavulanate 500-125 MG tablet  Commonly known as:  AUGMENTIN     gabapentin 100 MG capsule  Commonly known as:  NEURONTIN     metolazone 2.5 MG tablet  Commonly known as:  ZAROXOLYN     ondansetron 4 MG disintegrating tablet  Commonly known as:  ZOFRAN ODT     oxyCODONE 5 MG immediate release tablet  Commonly known as:  ROXICODONE     potassium chloride 10 MEQ tablet  Commonly known as:  K-DUR,KLOR-CON      TAKE these medications  Digoxin 62.5 MCG Tabs  Take 0.0625 mg by mouth daily. Take only 5 days a week; skip Wednesdays and Sundays     oxyCODONE-acetaminophen 5-325 MG tablet  Commonly known as:  PERCOCET/ROXICET  Take 1-2 tablets by mouth every 4 (four) hours as needed for moderate pain or severe pain.     pantoprazole 40 MG tablet  Commonly known as:  PROTONIX  Take 1 tablet (40 mg total) by mouth daily.     piperacillin-tazobactam 3.375 GM/50ML IVPB  Commonly known as:  ZOSYN  Inject 50 mLs (3.375 g total) into the vein every 8 (eight) hours.     torsemide 20 MG tablet  Commonly known as:  DEMADEX  Take 2 tablets (40 mg total) by mouth daily.     vancomycin 50 mg/mL oral solution  Commonly known as:  VANCOCIN  PO vancomycin 125 mg QID until 5/19, then 1 week of vancomycin 125mg  bid, 1 week of  125mg  qd, 2 weeks of 125mg  every other day     verapamil 120 MG CR tablet  Commonly known as:  CALAN-SR  Take 1 tablet (120 mg total) by mouth daily.     warfarin 5  MG tablet  Commonly known as:  COUMADIN  Take 2.5-5 mg by mouth as directed. Takes 0.5 tablet (2.5 mg total) by mouth Monday, Wednesday, Friday, Saturday. Take 1 tablet (5 mg total) by mouth Sunday, Tuesday, Thursday.        Discharge Instructions: Please refer to Patient Instructions section of EMR for full details.  Patient was counseled important signs and symptoms that should prompt return to medical care, changes in medications, dietary instructions, activity restrictions, and follow up appointments.   Follow-Up Appointments:     Follow-up Information    Follow up with TSUEI,MATTHEW K., MD. Call in 4 weeks.   Specialty:  General Surgery   Why:  For post-operation check - call office to verify appointment date/time with Dr. Georgette Dover.   Contact information:   [address] [place] [address] [phone]       Tonette Bihari, MD 06/06/2015, 3:15 PM PGY-1, [place]

## 2015-06-06 NOTE — PMR Pre-admission (Addendum)
PMR Admission Coordinator Pre-Admission Assessment  Patient: [name] is an 82 y.o., female MRN: [id number] DOB: 1923-05-22 Height: 5\' 5"  (165.1 cm) (05/25/15 @ 1352) Weight: 73.846 kg (162 lb 12.8 oz)              Insurance Information HMO:     PPO:       PCP:       IPA:       80/20:       OTHER:   PRIMARY:  Medicare A/B      Policy#: [id number]      Subscriber: Gaylyn Cheers CM Name:        Phone#:       Fax#:   Pre-Cert#:        Employer: Retired Benefits:  Phone #:       Name: Checked in [place]. Date: 01/26/90     Deduct: $1316      Out of Pocket Max: none      Life Max: unlimited CIR: 100%      SNF: 100 days Outpatient: 80%     Co-Pay: 20% Home Health: 100%      Co-Pay: none DME: 80%     Co-Pay: 20% Providers: patient's choice  SECONDARY: BCBS supplement      Policy#: [id number]      Subscriber: Gaylyn Cheers CM Name:        Phone#:       Fax#:   Pre-Cert#:        Employer:  Retired Benefits:  Phone #: [phone]     Name:   Eff. Date:       Deduct:        Out of Pocket Max:        Life Max:   CIR:        SNF:   Outpatient:       Co-Pay:   Home Health:        Co-Pay:   DME:       Co-Pay:    Emergency Contact Information Contact Information    Name Relation Home Work Mobile   Cathey,Donna Daughter   [phone]     Current Medical History  Patient Admitting Diagnosis: Deconditioning related diverticulitis/multi-medical    History of Present Illness: A 82 y.o. 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 Patient lives alone independent prior to admission using furniture at times for balance. Her daughter lives in the area and checks on  her as needed. She has another daughter whom she may stay with, if needed. Presented 05/25/2015 of left lower quadrant abdominal pain. INR on admission of 3.01. CT of the abdomen showed diverticulitis involving the sigmoid colon with previously noted extraluminal collection of gas worrisome for abscess with signs of perforation. No change with conservative care and underwent sigmoid colectomy, descending colostomy, small bowel resection with placement of pelvic abscess drain 05/31/2015 per Dr.Tsuei. Hospital course pain management. C. difficile colitis on contact precautions. TPN initiated for nutritional supportAnd diet slowly advanced. Postop urinary retention advisement to continue Foley tube for 7 days postoperatively. Intravenous heparin Initiated ongoing and Coumadin Resumed 06/06/2015. Leukocytosis 28,300 Placed on intravenous Zosyn/vancomycin for recurrent diverticulitis/duration until 06/14/2015 then at the end of 2 weeks prolonged taper of by mouth vancomycin. White blood cell count has  improved to 9.6. Patient developed coffee-ground output from her ostomy 06/06/2015 with hemoglobin 8.5-9.0 with latest hemoglobin 7.4 05/16/2017And transfused with hemoglobin 9.5 . KUB of the abdomen showed small bowel dilatation with air-fluid levels consistent with small bowel obstruction versus ileus. Her diet was downgraded to liquid and slowly advanced to regular consistency and tolerating well. Physical therapy evaluation completed 06/02/2015 with recommendations of physical medicine rehabilitation consult.  Patient to be admitted for a comprehensive inpatient rehabilitation program.   Past Medical History  Past Medical History  Diagnosis Date  . Pericardial effusion     a. s/p pericardial window 01/16/15  . History of GI bleed     a. secondary to AVM's. Treated with Fe infusion  . HTN (hypertension)   . Obesity   . LVH (left ventricular hypertrophy)   . Sleep apnea     mild-no cpap  . Positive TB  test   . Anemia   . Stroke [facility])     a. 2013: right frontal  . Arthritis   . Tachycardia-bradycardia syndrome [facility])     a. s/p Medtronic [place], model number [id number], serial number [id number] H 12/2013  . History of shingles   . Chronic diastolic (congestive) heart failure ([place])     a. Echo 01/2015 with EF of 55-60%.  . Chronic atrial fibrillation (H[facility])     a. on Coumadin, Digoxin, and Verapamil    Family History  family history includes Colon cancer in her daughter and sister; Pneumonia in her father; Stroke in her father and mother.  Prior Rehab/Hospitalizations: No previous rehab admissions.   Has the patient had major surgery during 100 days prior to admission? Yes.  Had an effusion around her heart in Dec. 2016.  Current Medications   Current facility-administered medications:  .  alum & mag hydroxide-simeth (MAALOX/MYLANTA) 200-200-20 MG/5ML suspension 30 mL, 30 mL, Oral, Q6H PRN, Michael Boston, MD .  antiseptic oral rinse (CPC / CETYLPYRIDINIUM CHLORIDE 0.05%) solution 7 mL, 7 mL, Mouth Rinse, q12n4p, [name], MD, 7 mL at 06/14/15 1108 .  benzocaine-Menthol (DERMOPLAST) 20-0.5 % topical spray 1 application, 1 application, Topical, QID PRN, [name], MD .  chlorhexidine (PERIDEX) 0.12 % solution 15 mL, 15 mL, Mouth Rinse, BID, [name], MD, 15 mL at 06/14/15 0839 .  feeding supplement (BOOST / RESOURCE BREEZE) liquid 1 Container, 1 Container, Oral, TID BM, Alveda Reasons, MD, 1 Container at 06/13/15 1932 .  guaiFENesin tablet 200 mg, 200 mg, Oral, BID, Lupita Dawn, MD, 200 mg at 06/14/15 1058 .  insulin aspart (novoLOG) injection 0-15 Units, 0-15 Units, Subcutaneous, Q4H, Jaquita Folds, RPH, 2 Units at 06/14/15 1204 .  lactated ringers infusion, , Intravenous, Continuous, [name], MD, Last Rate: 10 mL/hr at 06/13/15 0926 .  lip balm (BLISTEX) ointment, , Topical, BID, Alveda Reasons, MD .  magic mouthwash, 15 mL, Oral, QID PRN,  Michael Boston, MD .  menthol-cetylpyridinium (CEPACOL) lozenge 3 mg, 1 lozenge, Oral, PRN, Michael Boston, MD .  ondansetron [facility]) tablet 4 mg, 4 mg, Oral, Q6H PRN, 4 mg at 05/25/15 2330 **OR** ondansetron (ZOFRAN) injection 4 mg, 4 mg, Intravenous, Q6H PRN, Olam Idler, MD, 4 mg at 06/10/15 1649 .  pantoprazole (PROTONIX) EC tablet 40 mg, 40 mg, Oral, Daily, [name], MD, 40 mg at 06/14/15 1106 .  piperacillin-tazobactam (ZOSYN) IVPB 3.375 g, 3.375 g, Intravenous, Q8H, [name], MD, 3.375 g at 06/14/15 0839 .  potassium chloride 10  mEq in 100 mL IVPB, 10 mEq, Intravenous, Q1 Hr x 5, [name], MD, 10 mEq at 06/14/15 1253 .  saccharomyces boulardii (FLORASTOR) capsule 250 mg, 250 mg, Oral, BID, Emina Riebock, NP, 250 mg at 06/14/15 1107 .  silver sulfADIAZINE (SILVADENE) 1 % cream, , Topical, PRN, [name], MD .  sodium chloride flush (NS) 0.9 % injection 10-40 mL, 10-40 mL, Intracatheter, Q12H, Coralie Keens, MD, 10 mL at 06/14/15 1101 .  sodium chloride flush (NS) 0.9 % injection 10-40 mL, 10-40 mL, Intracatheter, PRN, Coralie Keens, MD, 20 mL at 06/10/15 1406 .  sodium chloride flush (NS) 0.9 % injection 10-40 mL, 10-40 mL, Intracatheter, PRN, [name], MD, 10 mL at 06/13/15 1646 .  torsemide (DEMADEX) tablet 80 mg, 80 mg, Oral, Daily, [name], MD, 80 mg at 06/14/15 1058 .  vancomycin (VANCOCIN) 50 mg/mL oral solution 125 mg, 125 mg, Oral, Q6H, [name], MD, 125 mg at 06/14/15 1203 .  verapamil (CALAN-SR) CR tablet 120 mg, 120 mg, Oral, Daily, Michael Boston, MD, 120 mg at 06/14/15 1059 .  zolpidem (AMBIEN) tablet 2.5 mg, 2.5 mg, Oral, QHS PRN, Alveda Reasons, MD, 2.5 mg at 06/13/15 2151  Patients Current Diet: Diet - low sodium heart healthy Diet regular Room service appropriate?: Yes; Fluid consistency:: Thin  Precautions / Restrictions Precautions Precautions: Fall Precaution Comments: IV lines and  colostomy bag.   Restrictions Weight Bearing Restrictions: No   Has the patient had 2 or more falls or a fall with injury in the past year?No.  Reports 1 fall about 6-8 weeks ago resulting in a sore bottom.  Prior Activity Level Household: Was driving up until a month ago.  Now daughter driving.  Most recently homebound going to MD appointments.  Home Assistive Devices / Equipment Home Assistive Devices/Equipment: Eyeglasses Home Equipment: Walker - 2 wheels  Prior Device Use: Indicate devices/aids used by the patient prior to current illness, exacerbation or injury? None - holds onto furniture.  Prior Functional Level Prior Function Level of Independence: Independent  Self Care: Did the patient need help bathing, dressing, using the toilet or eating?  Independent  Indoor Mobility: Did the patient need assistance with walking from room to room (with or without device)? Independent  Stairs: Did the patient need assistance with internal or external stairs (with or without device)? Independent  Functional Cognition: Did the patient need help planning regular tasks such as shopping or remembering to take medications? Independent  Current Functional Level Cognition  Overall Cognitive Status: Within Functional Limits for tasks assessed Orientation Level: Oriented X4 General Comments: Pt able to verbalize needing to go to the bathroom.    Extremity Assessment (includes Sensation/Coordination)  Upper Extremity Assessment: Generalized weakness  Lower Extremity Assessment: Generalized weakness    ADLs       Mobility  Overal bed mobility: Needs Assistance Bed Mobility: Supine to Sit Supine to sit: Min assist Sit to supine: Mod assist General bed mobility comments: Pt required assistance to advance to edge of bed.  Pt required cues for sequencing and hand placement.  Pt required assist for trunk elevation and advancement of B LEs out of bed.      Transfers  Overall transfer  level: Needs assistance Equipment used: Rolling walker (2 wheeled) Transfers: Sit to/from Stand Sit to Stand: Min assist Stand pivot transfers: Min assist General transfer comment: Pt required cues for hand placement, forward weight shifting and upright posture.  Pt require assist to  boost into standing from seated surface.     Ambulation / Gait / Stairs / Wheelchair Mobility  Ambulation/Gait Ambulation/Gait assistance: Min assist, Mod assist Ambulation Distance (Feet): 68 Feet Assistive device: Rolling walker (2 wheeled) Gait Pattern/deviations: Step-to pattern, Decreased stride length, Trunk flexed, Decreased step length - left, Decreased step length - right, Decreased weight shift to right General Gait Details: Pt required rest periods x2 in which patient reaches for front bar of RW to rest.  Pt required cues for safety to remove hand s from bar and re grab hand grips.  Pt required max cues for motivation to advance gait distance.   Gait velocity: slow    Posture / Balance Dynamic Sitting Balance Sitting balance - Comments: Very nice ability to scoot hips back toward center of bed while sitting EOB Balance Overall balance assessment: Needs assistance Sitting-balance support: Feet supported Sitting balance-Leahy Scale: Good Sitting balance - Comments: Very nice ability to scoot hips back toward center of bed while sitting EOB Standing balance support: Bilateral upper extremity supported Standing balance-Leahy Scale: Poor Standing balance comment: UE support    Special needs/care consideration BiPAP/CPAP No CPM No Continuous Drip IV No Dialysis No     Life Vest No Oxygen Yes, has 02 at home but does not use it much Special Bed No Trach Size No Wound Vac (area) No     Skin Large post op abdominal dressings, right side JP drain removed 06/06/15, has a colostomy left abdominal area                          Bowel mgmt: Colostomy Bladder mgmt: Urinary catheter removed  06/06/15 Diabetic mgmt Currently on insulin now in the acute hospital, not a diabetic previously Enteric Precautions: Yes   Previous Home Environment Living Arrangements: Alone Available Help at Discharge: Family, Available PRN/intermittently Type of Home: House Home Layout: Two level Alternate Level Stairs-Rails: Left Alternate Level Stairs-Number of Steps: 12 Home Access: Stairs to enter Entrance Stairs-Rails: Left Entrance Stairs-Number of Steps: 2 Bathroom Shower/Tub: Tub/shower unit Home Care Services: No  Discharge Living Setting Plans for Discharge Living Setting: Patient's home, Alone, House (Lived alone, but may go home with daughter.) Type of Home at Discharge: House Discharge Home Layout: Two level, Able to live on main level with bedroom/bathroom Alternate Level Stairs-Number of Steps: 1 Does the patient have any problems obtaining your medications?: No  Social/Family/Support Systems Patient Roles: Parent (Widow with 3 daughters.) Contact Information: Melinda Crutch - daughter Anticipated Caregiver: [name] Anticipated Caregiver's Contact Information: [name] - daughter [phone] Ability/Limitations of Caregiver: May go home with youngest daughter, [name].  Oldest daughter is [name] and she lives 1 mile away. Caregiver Availability: 24/7 Discharge Plan Discussed with Primary Caregiver: Yes Is Caregiver In Agreement with Plan?: Yes Does Caregiver/Family have Issues with Lodging/Transportation while Pt is in Rehab?: No  Goals/Additional Needs Patient/Family Goal for Rehab: PT/OT mod I and supervision goals Expected length of stay: 7-10 days Cultural Considerations: None Dietary Needs: Regular diet, thin liquids Equipment Needs: TBD Pt/Family Agrees to Admission and willing to participate: Yes Program Orientation Provided & Reviewed with Pt/Caregiver Including Roles  & Responsibilities: Yes  Decrease burden of Care through IP rehab admission: N/A  Possible need for  SNF placement upon discharge: Not planned  Patient Condition: This patient's medical and functional status has changed since the consult dated: 06/03/15 in which the Rehabilitation Physician determined and documented that the patient's condition is appropriate for intensive  rehabilitative care in an inpatient rehabilitation facility. See "History of Present Illness" (above) for medical update. Functional changes are:  Currently requiring min/mod assist to ambulate 68 feet RW. Attempted to admit to acute inpatient rehab on 06/05/15 but rehab MD did not feel patient was ready medically. Patient has now been cleared by surgeon and medical team as stable for inpatient rehab.  Patient's medical and functional status update has been discussed with the Rehabilitation physician and patient remains appropriate for inpatient rehabilitation. Will admit to inpatient rehab today.  Preadmission Screen Completed By:  Retta Diones, 06/14/2015 1:26 PM ______________________________________________________________________   Discussed status with Dr. Naaman Plummer on 06/14/15 at 1330 and received telephone approval for admission today.  Admission Coordinator:  Retta Diones, time1330/Date05/19/17

## 2015-06-06 NOTE — Progress Notes (Signed)
Received message from patients daughter that Rehab MD stopped by 6N to see patient and stated that he was concerned about patients colostomy output (coffee ground appearance possible for blood) therefore patient would be transferred once surgeon is able to stop by and assess patient. I spoke with inpatient rehab coordinator Genie who confirmed to me that she spoke with MD Kirsteins. Attempted to page on call MD Grandville Silos and waiting for response back. Will continue to monitor.

## 2015-06-06 NOTE — Progress Notes (Signed)
Family Medicine Teaching Service Daily Progress Note Intern Pager: [phone]  Patient name: [name] Medical record number: [id number] Date of birth: [birth year] Age: 82 y.o. Gender: female  Primary Care Provider: [name], MD Consultants: ID, surgery  Code Status: Full   Pt Overview and Major Events to Date:  4/28: Readmit for Diverticulitis with possible perforation / abscess 5/5: Hartmann's procedure  Assessment and Plan: [name] is a 82 y.o. female presenting with LLQ abdominal pain. PMH is significant for atrial fibrillation, hx of CVA, hypertension, type 2 diabetes, tachycardia-bradycardia syndrome s/p pacemaker, prior pericardial effusion s/p pericardial window 12/16, sleep apnea (no CPAP), stroke (right frontal 2013), GI bleeding with hemorrhoids, and arthritis and recurrent diverticulitis.  # S/p Hartmann procedure on 5/5  following recurrent diverticulitis possibly perforation / abscess: Repeat CT Interval development of colovesical fistula between the sigmoid colon and anterior superior left bladder wall. Sigmoid mesentery abscess is mildly decreased in size. Left anterior pelvic pericolonic abscess is mildly increased in size.  Bilateral indeterminate renal lesions requiring further imaging. Slightly elevated WBC 15.1> 11.1>9.9>10.4  likely due to recent surgery - Antibiotics regiment per ID  2 weeks of zosyn post surgery (end 5-19) Vancomycin 125 mg PO q6hrs ( end 5/19)  Then, at the end of 2 weeks prolonged taper of po vanco 125mg  bid for 1 week 125mg  qdy for 1 week 125mg  qoday for 2 weeks.  - Pain control with PO morphine transition today  - Foley catheter in for a week due bladder surgical repair, last day 5/11  # LE edema. On Demadex 80 mg daily at home. Lower extremity edema improved, swelling in arms as well.  - 4.6 L output - Daily weights, stable  - Lasix 80 IV > PO Torsmide 80 mg today  - Continue BMETs, Scr 0.98> 1.14 - Strict I/Os  #  C.difficule positive: Ostomy in place.  Having stool output. - PO vancomycin 150 mg QID (5/1 > 5/5),  IV flagyl 5/6- 5/9,  - PO vancomycin 150 mg QID (5/10>>), regiment as above  - Contact precautions   # Chronic Atrial fibrillation: Ventricular Pacemaker for AF. INR supra-therapeutic on 4/26. INR 1.57>>1.51 - Continue Verapamil and Digoxin  - Heparin per pharmacy - Restart Warfarin tomorrow   # DM2: Diet controlled. A1c 6.2 (12/2014)> 6.3  - monitor cbgs; SSI sensitive   # Anemia: Hgb 10.4 on admit (baseline 11). History of diverticulitis and hemorrhoids - Slight drop in hgb from 9.8 > 8.8> 8.5, stable  - AM CBC   #CKD Stage 3: Baseline Cr 1-1.2  -stable   FEN/GI: Regular; SLIV,  Prophylaxis: Heparin gtt  Disposition: Med-surg, CIR possibly today   Subjective:  Patient was tired today. She did not sleep well last night. She has no complaints this morning.   Objective: Temp:  [97.7 F (36.5 C)-98.9 F (37.2 C)] 97.7 F (36.5 C) (05/11 0451) Pulse Rate:  [82-89] 82 (05/11 0451) Resp:  [18-20] 18 (05/11 0451) BP: (121-128)/(59-67) 121/64 mmHg (05/11 0451) SpO2:  [95 %-100 %] 95 % (05/11 0451) Weight:  [164 lb 1.6 oz (74.435 kg)] 164 lb 1.6 oz (74.435 kg) (05/11 0429) Physical Exam: General: NAD, lying in bed Cardiovascular: regular rate and rhythm with 2/6 murmur, paced rhythm  Respiratory: CTAB, no increased WOB  Abdomen: Soft, colostomy bag in place with brown stool in bag, BS+  Ext: +2 LE edema bilaterally, mid calf    Laboratory:  Recent Labs Lab 06/04/15 0517 06/05/15 0430 06/06/15 0344  WBC 11.1* 9.9 10.4  HGB 8.8* 8.5* 9.0*  HCT 29.0* 26.5* 27.6*  PLT 249 204 254    Recent Labs Lab 05/30/15 1000  06/03/15 0513  06/04/15 1325 06/05/15 1125 06/06/15 0344  NA 132*  < > 143  < > 143 145 146*  K 5.3*  < > 3.8  < > 3.3* 3.2* 3.2*  CL 100*  < > 111  < > 109 111 109  CO2 22  < > 22  < > 25 25 27   BUN 23*  < > 47*  < > 52* 43* 37*  CREATININE  1.05*  < > 1.27*  < > 1.22* 0.98 1.14*  CALCIUM 8.2*  < > 8.7*  < > 8.6* 8.6* 8.7*  PROT 5.8*  --  5.2*  --   --   --  4.9*  BILITOT 0.5  --  0.4  --   --   --  0.6  ALKPHOS 94  --  73  --   --   --  71  ALT 12*  --  13*  --   --   --  11*  AST 21  --  15  --   --   --  22  GLUCOSE 127*  < > 152*  < > 132* 127* 144*  < > = values in this interval not displayed.  Imaging/Diagnostic Tests: No results found.  [name], MD 06/06/2015, 9:53 AM PGY-1, [place] Intern pager: [phone], text pages welcome

## 2015-06-06 NOTE — H&P (Signed)
  Physical Medicine and Rehabilitation Admission H&P    Chief Complaint  Patient presents with  . Abdominal Pain  : HPI: [name] is a 82 y.o. 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 Patient lives alone independent prior to admission using furniture at times for balance. Her daughter lives in the area and checks on her as needed. She has another daughter whom she may stay with, if needed. Presented 05/25/2015 of left lower quadrant abdominal pain. INR on admission of 3.01. CT of the abdomen showed diverticulitis involving the sigmoid colon with previously noted extraluminal collection of gas worrisome for abscess with signs of perforation. No change with conservative care and underwent sigmoid colectomy, descending colostomy, small bowel resection with placement of pelvic abscess drain 05/31/2015 per Dr.Tsuei. Hospital course pain management. C. difficile colitis   on contact precautions. TPN initiated for nutritional supportAnd diet slowly advanced. Postop urinary retention advisement to continue Foley tube for 7 days postoperatively. Intravenous heparin Initiated ongoing and  Coumadin Resumed 06/06/2015. Leukocytosis 28,300 Placed on intravenous Zosyn/vancomycin for recurrent diverticulitis/duration until 06/14/2015 then at the end of 2 weeks prolonged taper of by mouth vancomycin. White blood cell count has improved to 9.6. Patient developed coffee-ground output from her ostomy 06/06/2015 with hemoglobin 8.5-9.0 with latest hemoglobin 7.4 05/16/2017And transfused with hemoglobin 9.5 . KUB of the abdomen showed small bowel dilatation with air-fluid levels consistent with  small bowel obstruction versus ileus. Her diet was downgraded to liquid and slowly advanced to regular consistency and tolerating well. Physical therapy evaluation completed 06/02/2015 with recommendations of physical medicine rehabilitation consult.Patient was admitted for a comprehensive rehabilitation program  ROS Constitutional: Negative for fever and chills.  HENT: Negative for hearing loss.  Eyes: Negative for double vision.  Respiratory: Positive for shortness of breath. Negative for cough.  Cardiovascular: Negative for chest pain.  Gastrointestinal: Positive for abdominal pain.  Genitourinary: Positive for urgency. Negative for dysuria and hematuria.  Musculoskeletal: Positive for joint pain.  Skin: Negative for rash.  Neurological: Positive for weakness. Negative for seizures and headaches.  All other systems reviewed and are negative   Past Medical History  Diagnosis Date  . Pericardial effusion     a. s/p pericardial window 01/16/15  . History of GI bleed     a. secondary to AVM's. Treated with Fe infusion  . HTN (hypertension)   . Obesity   . LVH (left ventricular hypertrophy)   . Sleep apnea     mild-no cpap  . Positive TB test   . Anemia   . Stroke (HCC)     a. 2013: right frontal  . Arthritis   . Tachycardia-bradycardia syndrome (HCC)     a. s/p Medtronic Sensia, model number SESR01, serial number [id number] 12/2013  . History of shingles   . Chronic diastolic (congestive) heart failure (HCC)     a. Echo 01/2015 with EF of 55-60%.  . Chronic atrial fibrillation (HCC)     a. on Coumadin, Digoxin, and Verapamil   Past Surgical History  Procedure Laterality Date  . Breast lumpectomy Bilateral     negative for cancer  . Esophagogastroduodenoscopy  05/19/2011    Procedure: ESOPHAGOGASTRODUODENOSCOPY (EGD);  Surgeon: Vincent C. Schooler, MD;  Location: [facility] ENDOSCOPY;  Service: Endoscopy;  Laterality: N/A;  doctor   aware of inr   will try to be here no later than  230  . Cholecystectomy  2005  . Esophagogastroduodenoscopy  06/22/2011    Procedure: ESOPHAGOGASTRODUODENOSCOPY (EGD);  Surgeon: Arta Silence, MD;  Location: [facility] ENDOSCOPY;  Service: Endoscopy;  Laterality: N/A;  Check PT/INR in am  . Givens capsule study  06/23/2011    Procedure: GIVENS CAPSULE STUDY;  Surgeon: Arta Silence, MD;  Location: [facility] ENDOSCOPY;  Service: Endoscopy;  Laterality: N/A;  . Colonoscopy  08/11/2011    Procedure: COLONOSCOPY;  Surgeon: Jeryl Columbia, MD;  Location: [facility] ENDOSCOPY;  Service: Endoscopy;  Laterality: N/A;  . Hot hemostasis  08/11/2011    Procedure: HOT HEMOSTASIS (ARGON PLASMA COAGULATION/BICAP);  Surgeon: Jeryl Columbia, MD;  Location: [facility] ENDOSCOPY;  Service: Endoscopy;  Laterality: N/A;  . Mass excision Left 09/14/2013    Procedure: EXCISION MASS LEFT WRIST;  Surgeon: Leanora Cover, MD;  Location: [facility];  Service: Orthopedics;  Laterality: Left;  . Tonsillectomy and adenoidectomy  1950  . Insert / replace / remove pacemaker  2001    Last generator in 2006; interrogated Dec 2012  . Cataract extraction w/ intraocular lens  implant, bilateral Bilateral   . Lipoma excision Left 07/2013    wrist  . Pacemaker generator change N/A 01/02/2014    Procedure: PACEMAKER GENERATOR CHANGE;  Surgeon: Sanda Klein, MD;  Location: [facility] CATH LAB;  Service: Cardiovascular;  Laterality: N/A;  . Lead revision N/A 01/02/2014    Procedure: LEAD REVISION;  Surgeon: Sanda Klein, MD;  Location: [facility] CATH LAB;  Service: Cardiovascular;  Laterality: N/A;  . Subxyphoid pericardial window N/A 01/16/2015    Procedure: SUBXYPHOID PERICARDIAL WINDOW;  Surgeon: Grace Isaac, MD;  Location: [facility];  Service: Thoracic;  Laterality: N/A;  . Tee without cardioversion N/A 01/16/2015    Procedure: TRANSESOPHAGEAL ECHOCARDIOGRAM (TEE);  Surgeon: Grace Isaac, MD;  Location: [facility];  Service: Thoracic;  Laterality: N/A;  . Colon resection N/A 05/31/2015    Procedure: SIGMOID  COLECTOMY;  Surgeon: Donnie Mesa, MD;  Location: [facility];  Service: General;  Laterality: N/A;  . Colostomy N/A 05/31/2015    Procedure: DESCENDING COLOSTOMY;  Surgeon: Donnie Mesa, MD;  Location: [facility];  Service: General;  Laterality: N/A;  . Bowel resection N/A 05/31/2015    Procedure: SMALL BOWEL RESECTION;  Surgeon: Donnie Mesa, MD;  Location: [facility];  Service: General;  Laterality: N/A;  . Debridement of abdominal wall abscess N/A 05/31/2015    Procedure: DRAINAGE OF PELVIC ABSCESS;  Surgeon: Donnie Mesa, MD;  Location: [facility] OR;  Service: General;  Laterality: N/A;   Family History  Problem Relation Age of Onset  . Stroke Mother   . Stroke Father   . Pneumonia Father   . Colon cancer Sister   . Colon cancer Daughter    Social History:  reports that she has never smoked. She has never used smokeless tobacco. She reports that she drinks alcohol. She reports that she does not use illicit drugs. Allergies:  Allergies  Allergen Reactions  . Anti-Inflammatory Enzyme [Nutritional Supplements] Other (See Comments)    Retains fluids and headaches  . Iodinated Diagnostic Agents Hives    HIVES 15MIN S/P IV CONTRAST INJECTION,WILL NEED 13 HR PREP FOR FUTURE INJECTIONS, ok s/p 56m po benadryl//a.calhoun  . Spironolactone Other (See Comments)    Hair loss  . Arthrotec [Diclofenac-Misoprostol] Other (See Comments)    unknown  . Biaxin [Clarithromycin] Other (See Comments)    unknown  .  Plavix [Clopidogrel Bisulfate] Other (See Comments)    unknown   Medications Prior to Admission  Medication Sig Dispense Refill  . Digoxin 62.5 MCG TABS Take 0.0625 mg by mouth daily. Take only 5 days a week; skip Wednesdays and Sundays 30 tablet 6  . oxyCODONE (ROXICODONE) 5 MG immediate release tablet Take one half tablet in the morning as needed for back pain. (Patient taking differently: Take 2.5 mg by mouth daily as needed for severe pain. Take one half tablet in the morning as needed for back pain.) 20  tablet 0  . verapamil (CALAN-SR) 180 MG CR tablet take 1 tablet by mouth at bedtime (Patient taking differently: take 1 tablet by mouth ONCE DAILY) 30 tablet 11  . warfarin (COUMADIN) 5 MG tablet Take 2.5-5 mg by mouth as directed. Takes 0.5 tablet (2.5 mg total) by mouth Monday, Wednesday, Friday, Saturday. Take 1 tablet (5 mg total) by mouth Sunday, Tuesday, Thursday.    . amoxicillin-clavulanate (AUGMENTIN) 500-125 MG tablet Take 1 tablet (500 mg total) by mouth every 12 (twelve) hours. Start tonight. (Patient not taking: Reported on 05/25/2015) 10 tablet 0  . gabapentin (NEURONTIN) 100 MG capsule Take 1 capsule (100 mg total) by mouth 3 (three) times daily. 90 capsule 3  . metolazone (ZAROXOLYN) 2.5 MG tablet Take 1 tablet (2.5 mg total) by mouth daily. (Patient not taking: Reported on 05/25/2015) 30 tablet 0  . ondansetron (ZOFRAN ODT) 4 MG disintegrating tablet Take 1 tablet (4 mg total) by mouth every 8 (eight) hours as needed for nausea or vomiting. (Patient not taking: Reported on 05/25/2015) 20 tablet 0  . potassium chloride (K-DUR,KLOR-CON) 10 MEQ tablet Take 2 tablets (20 mEq total) by mouth 2 (two) times daily. Take with Torsemide 120 tablet 0  . [DISCONTINUED] torsemide (DEMADEX) 20 MG tablet Take 4 tablets (80 mg total) by mouth daily. (Patient not taking: Reported on 05/25/2015) 120 tablet 0    Home: Home Living Family/patient expects to be discharged to:: Private residence Living Arrangements: Alone Available Help at Discharge: Family, Available PRN/intermittently Type of Home: House Home Access: Stairs to enter Entrance Stairs-Number of Steps: 2 Entrance Stairs-Rails: Left Home Layout: Two level Alternate Level Stairs-Number of Steps: 12 Alternate Level Stairs-Rails: Left Bathroom Shower/Tub: Tub/shower unit Home Equipment: Walker - 2 wheels   Functional History: Prior Function Level of Independence: Independent  Functional Status:  Mobility: Bed Mobility Overal bed  mobility: Needs Assistance Bed Mobility: Supine to Sit Supine to sit: Min assist Sit to supine: Mod assist General bed mobility comments: Pt required assistance to advance to edge of bed.  Pt required cues for sequencing and hand placement.  Pt required assist for trunk elevation and advancement of B LEs out of bed.   Transfers Overall transfer level: Needs assistance Equipment used: Rolling walker (2 wheeled) Transfers: Sit to/from Stand Sit to Stand: Min assist Stand pivot transfers: Min assist General transfer comment: Pt required cues for hand placement, forward weight shifting and upright posture.  Pt require assist to boost into standing from seated surface.  Ambulation/Gait Ambulation/Gait assistance: Min assist, Mod assist Ambulation Distance (Feet): 68 Feet Assistive device: Rolling walker (2 wheeled) Gait Pattern/deviations: Step-to pattern, Decreased stride length, Trunk flexed, Decreased step length - left, Decreased step length - right, Decreased weight shift to right General Gait Details: Pt required rest periods x2 in which patient reaches for front bar of RW to rest.  Pt required cues for safety to remove hand s from bar and re grab   hand grips.  Pt required max cues for motivation to advance gait distance.   Gait velocity: slow    ADL:    Cognition: Cognition Overall Cognitive Status: Within Functional Limits for tasks assessed Orientation Level: Oriented X4 Cognition Arousal/Alertness: Awake/alert Behavior During Therapy: WFL for tasks assessed/performed Overall Cognitive Status: Within Functional Limits for tasks assessed Area of Impairment: Awareness Awareness: Anticipatory General Comments: Pt able to verbalize needing to go to the bathroom.  Physical Exam: Blood pressure 107/71, pulse 110, temperature 97.5 F (36.4 C), temperature source Oral, resp. rate 18, height _0  (1.651 m), weight 73.846 kg (162 lb 12.8 oz), SpO2 92 %.    Physical  Exam Constitutional: She appears well-developed and well-nourished.  HENT: dentition fair Head: Normocephalic and atraumatic.  Eyes: Conjunctivae and EOM are normal.  Neck: Normal range of motion. Neck supple. No thyromegaly present.  Cardiovascular:  Murmur. No rubs or gallops Irregularly irregular  Respiratory: Effort normal and breath sounds normal. No respiratory distress.  GI: Soft. Bowel sounds are normal.  Colostomy in place. Abdominal incision c/d/i. Pain in LLQ Musculoskeletal: She exhibits edema. She exhibits no tenderness.  Neurological: She is alert.  A&O2 HOH Sensation intact light touch neck sign DTRs symmetric Motor: Bilateral upper extremities 5/5 proximal to distal Left lower extremity: Hip flexion, knee extension 3 -/5, ankle dorsi/plantar flexion were 5/5 Right lower extremity: Hip flexion 3-/5, knee extension 3/5, ankle dorsi/plantarflexion 5/5  Skin: Skin is warm and dry.  Psychiatric: She has a normal mood and affect. Her behavior is normal   Results for orders placed or performed during the hospital encounter of 05/25/15 (from the past 48 hour(s))  Glucose, capillary     Status: Abnormal   Collection Time: 06/12/15  4:40 PM  Result Value Ref Range   Glucose-Capillary 113 (H) 65 - 99 mg/dL   Comment 1 Notify RN   CBC     Status: Abnormal   Collection Time: 06/12/15  8:20 PM  Result Value Ref Range   WBC 16.1 (H) 4.0 - 10.5 K/uL   RBC 3.58 (L) 3.87 - 5.11 MIL/uL   Hemoglobin 10.0 (L) 12.0 - 15.0 g/dL   HCT 30.5 (L) 36.0 - 46.0 %   MCV 85.2 78.0 - 100.0 fL   MCH 27.9 26.0 - 34.0 pg   MCHC 32.8 30.0 - 36.0 g/dL   RDW 17.8 (H) 11.5 - 15.5 %   Platelets 278 150 - 400 K/uL  Glucose, capillary     Status: Abnormal   Collection Time: 06/12/15  8:20 PM  Result Value Ref Range   Glucose-Capillary 143 (H) 65 - 99 mg/dL  Glucose, capillary     Status: Abnormal   Collection Time: 06/12/15 11:51 PM  Result Value Ref Range   Glucose-Capillary 105 (H) 65 - 99  mg/dL  Glucose, capillary     Status: None   Collection Time: 06/13/15  3:51 AM  Result Value Ref Range   Glucose-Capillary 97 65 - 99 mg/dL  CBC     Status: Abnormal   Collection Time: 06/13/15  6:25 AM  Result Value Ref Range   WBC 12.2 (H) 4.0 - 10.5 K/uL   RBC 3.51 (L) 3.87 - 5.11 MIL/uL   Hemoglobin 9.5 (L) 12.0 - 15.0 g/dL   HCT 29.6 (L) 36.0 - 46.0 %   MCV 84.3 78.0 - 100.0 fL   MCH 27.1 26.0 - 34.0 pg   MCHC 32.1 30.0 - 36.0 g/dL   RDW 18.4 (H) 11.5 -  15.5 %   Platelets 271 150 - 400 K/uL  Protime-INR     Status: Abnormal   Collection Time: 06/13/15  6:25 AM  Result Value Ref Range   Prothrombin Time 15.6 (H) 11.6 - 15.2 seconds   INR 1.22 0.00 - 1.49  Comprehensive metabolic panel     Status: Abnormal   Collection Time: 06/13/15  6:25 AM  Result Value Ref Range   Sodium 140 135 - 145 mmol/L   Potassium 3.2 (L) 3.5 - 5.1 mmol/L   Chloride 105 101 - 111 mmol/L   CO2 26 22 - 32 mmol/L   Glucose, Bld 92 65 - 99 mg/dL   BUN 13 6 - 20 mg/dL   Creatinine, Ser 0.89 0.44 - 1.00 mg/dL   Calcium 8.4 (L) 8.9 - 10.3 mg/dL   Total Protein 4.6 (L) 6.5 - 8.1 g/dL   Albumin 1.4 (L) 3.5 - 5.0 g/dL   AST 17 15 - 41 U/L   ALT 12 (L) 14 - 54 U/L   Alkaline Phosphatase 83 38 - 126 U/L   Total Bilirubin 0.8 0.3 - 1.2 mg/dL   GFR calc non Af Amer 55 (L) >60 mL/min   GFR calc Af Amer >60 >60 mL/min    Comment: (NOTE) The eGFR has been calculated using the CKD EPI equation. This calculation has not been validated in all clinical situations. eGFR's persistently <60 mL/min signify possible Chronic Kidney Disease.    Anion gap 9 5 - 15  Glucose, capillary     Status: None   Collection Time: 06/13/15  7:44 AM  Result Value Ref Range   Glucose-Capillary 96 65 - 99 mg/dL   Comment 1 Notify RN   Glucose, capillary     Status: Abnormal   Collection Time: 06/13/15 11:28 AM  Result Value Ref Range   Glucose-Capillary 141 (H) 65 - 99 mg/dL   Comment 1 Notify RN   Glucose, capillary      Status: Abnormal   Collection Time: 06/13/15  5:30 PM  Result Value Ref Range   Glucose-Capillary 127 (H) 65 - 99 mg/dL   Comment 1 Notify RN   Glucose, capillary     Status: None   Collection Time: 06/13/15  7:24 PM  Result Value Ref Range   Glucose-Capillary 87 65 - 99 mg/dL  Glucose, capillary     Status: Abnormal   Collection Time: 06/13/15 11:32 PM  Result Value Ref Range   Glucose-Capillary 112 (H) 65 - 99 mg/dL  Glucose, capillary     Status: None   Collection Time: 06/14/15  4:21 AM  Result Value Ref Range   Glucose-Capillary 90 65 - 99 mg/dL  CBC     Status: Abnormal   Collection Time: 06/14/15  5:04 AM  Result Value Ref Range   WBC 9.4 4.0 - 10.5 K/uL   RBC 3.59 (L) 3.87 - 5.11 MIL/uL   Hemoglobin 9.5 (L) 12.0 - 15.0 g/dL   HCT 30.2 (L) 36.0 - 46.0 %   MCV 84.1 78.0 - 100.0 fL   MCH 26.5 26.0 - 34.0 pg   MCHC 31.5 30.0 - 36.0 g/dL   RDW 18.3 (H) 11.5 - 15.5 %   Platelets 330 150 - 400 K/uL  Protime-INR     Status: Abnormal   Collection Time: 06/14/15  5:04 AM  Result Value Ref Range   Prothrombin Time 16.3 (H) 11.6 - 15.2 seconds   INR 1.30 0.00 - 1.49  Comprehensive metabolic panel  Status: Abnormal   Collection Time: 06/14/15  5:04 AM  Result Value Ref Range   Sodium 141 135 - 145 mmol/L   Potassium 2.4 (LL) 3.5 - 5.1 mmol/L    Comment: CRITICAL RESULT CALLED TO, READ BACK BY AND VERIFIED WITH: ABEIERA,L RN [id number] 5.19.17 MCADOO,G    Chloride 102 101 - 111 mmol/L   CO2 29 22 - 32 mmol/L   Glucose, Bld 95 65 - 99 mg/dL   BUN 11 6 - 20 mg/dL   Creatinine, Ser 1.11 (H) 0.44 - 1.00 mg/dL   Calcium 8.1 (L) 8.9 - 10.3 mg/dL   Total Protein 4.7 (L) 6.5 - 8.1 g/dL   Albumin 1.4 (L) 3.5 - 5.0 g/dL   AST 19 15 - 41 U/L   ALT 14 14 - 54 U/L   Alkaline Phosphatase 91 38 - 126 U/L   Total Bilirubin 0.7 0.3 - 1.2 mg/dL   GFR calc non Af Amer 42 (L) >60 mL/min   GFR calc Af Amer 49 (L) >60 mL/min    Comment: (NOTE) The eGFR has been calculated using the CKD EPI  equation. This calculation has not been validated in all clinical situations. eGFR's persistently <60 mL/min signify possible Chronic Kidney Disease.    Anion gap 10 5 - 15  Glucose, capillary     Status: Abnormal   Collection Time: 06/14/15  7:44 AM  Result Value Ref Range   Glucose-Capillary 154 (H) 65 - 99 mg/dL   Comment 1 Notify RN   Magnesium     Status: Abnormal   Collection Time: 06/14/15  8:40 AM  Result Value Ref Range   Magnesium 1.5 (L) 1.7 - 2.4 mg/dL  Glucose, capillary     Status: Abnormal   Collection Time: 06/14/15 11:55 AM  Result Value Ref Range   Glucose-Capillary 141 (H) 65 - 99 mg/dL   Comment 1 Notify RN    No results found.     Medical Problem List and Plan: 1. Debilitation  secondary to perforated diverticulitis status post sigmoid colectomy, descending colostomy with small bowel resection placement of pelvic abscess drain 05/05/2017Complicated by GI bleed and ileus  -admit to inpatient rehab 2.  DVT Prophylaxis/Anticoagulation: Coumadin discontinued due to GI bleed. 3. Pain Management: Tylenol as needed 4. Acute blood loss anemia/GI bleed. Follow-up CBC 5. Neuropsych: This patient is capable of making decisions on her own behalf. 6. Skin/Wound Care: Routine skin checks/colostomy education and skin care  -wounds clean/intact, ostomy is sealed 7. Fluids/Electrolytes/Nutrition: Routine I&O's with follow-up chemistries 8.ID. Continue vancomycin and Zosyn through 06/14/2015 and then PO taper with vancomycin as directed then began vancomycin 125 mg twice a day 1 week then 125 mg daily 1 week then 125 mg every other day for 2 weeks and stop  -contact precautions 9.HTN.Verapamil 120 mg daily. 10. Atrial fibrillation. Tachybradycardia syndrome with pacemaker. Cardiac rate control. Coumadin discontinued due to GI bleed. 11. Chronic renal insufficiency. Baseline creatinine 1.68. Follow-up chemistries 12. Chronic diastolic congestive heart failure.Demadex 80  mg daily. Monitor for any signs of fluid overload. Weigh patient daily 13.C. difficile positive. Contact precautions 14. Diabetes mellitus. Diet controlled. Hemoglobin A1c 6.2. Sliding scale insulin. Check blood sugars before meals and at bedtime   Post Admission Physician Evaluation: 1. Functional deficits secondary  to debility after colectomy and multiple medical. 2. Patient is admitted to receive collaborative, interdisciplinary care between the physiatrist, rehab nursing staff, and therapy team. 3. Patient's level of medical complexity and substantial therapy needs in context   of that medical necessity cannot be provided at a lesser intensity of care such as a SNF. 4. Patient has experienced substantial functional loss from his/her baseline which was documented above under the "Functional History" and "Functional Status" headings.  Judging by the patient's diagnosis, physical exam, and functional history, the patient has potential for functional progress which will result in measurable gains while on inpatient rehab.  These gains will be of substantial and practical use upon discharge  in facilitating mobility and self-care at the household level. 5. Physiatrist will provide 24 hour management of medical needs as well as oversight of the therapy plan/treatment and provide guidance as appropriate regarding the interaction of the two. 6. 24 hour rehab nursing will assist with bladder management, bowel management, safety, skin/wound care, disease management, medication administration, pain management and patient education  and help integrate therapy concepts, techniques,education, etc. 7. PT will assess and treat for/with: Lower extremity strength, range of motion, stamina, balance, functional mobility, safety, adaptive techniques and equipment, activity tolerance, wound care, ego support.   Goals are: supervision to mod I. 8. OT will assess and treat for/with: ADL's, functional mobility, safety, upper  extremity strength, adaptive techniques and equipment, activity tolerance, ego support, community reintegration.   Goals are: supervision to mod I. Therapy may not yet proceed with showering this patient. 9. SLP will assess and treat for/with: n/a.  Goals are: n/a. 10. Case Management and Social Worker will assess and treat for psychological issues and discharge planning. 11. Team conference will be held weekly to assess progress toward goals and to determine barriers to discharge. 12. Patient will receive at least 3 hours of therapy per day at least 5 days per week. 13. ELOS: 13-17 days       14. Prognosis:  excellent     Meredith Staggers, MD, [facility] 06/14/2015

## 2015-06-06 NOTE — Progress Notes (Signed)
ANTICOAGULATION CONSULT NOTE - Follow Up Consult  Pharmacy Consult for Coumadin Indication: atrial fibrillation  Allergies  Allergen Reactions  . Anti-Inflammatory Enzyme [Nutritional Supplements] Other (See Comments)    Retains fluids and headaches  . Iodinated Diagnostic Agents Hives    HIVES 15MIN S/P IV CONTRAST INJECTION,WILL NEED 13 HR PREP FOR FUTURE INJECTIONS, ok s/p 50mg  po benadryl//a.calhoun  . Spironolactone Other (See Comments)    Hair loss  . Arthrotec [Diclofenac-Misoprostol] Other (See Comments)    unknown  . Biaxin [Clarithromycin] Other (See Comments)    unknown  . Plavix [Clopidogrel Bisulfate] Other (See Comments)    unknown   Patient Measurements: Height: 5\' 5"  (165.1 cm) (05/25/15 @ 1352) Weight: 164 lb 1.6 oz (74.435 kg) IBW/kg (Calculated) : 57  Vital Signs: Temp: 97.7 F (36.5 C) (05/11 0451) Temp Source: Oral (05/11 0451) BP: 121/64 mmHg (05/11 0451) Pulse Rate: 82 (05/11 0451)  Labs:  Recent Labs  06/04/15 0517  06/04/15 1325 06/05/15 0430 06/05/15 1125 06/05/15 1430 06/06/15 0344  HGB 8.8*  --   --  8.5*  --   --  9.0*  HCT 29.0*  --   --  26.5*  --   --  27.6*  PLT 249  --   --  204  --   --  254  HEPARINUNFRC 0.31  --   --  0.21*  --  0.40 0.68  CREATININE  --   < > 1.22*  --  0.98  --  1.14*  < > = values in this interval not displayed.  Estimated Creatinine Clearance: 33.1 mL/min (by C-G formula based on Cr of 1.14).  Assessment: Heparin for hx afib (Coumadin 2.5mg  daily exc 5mg  on Sun/Tues/Thurs PTA) INR was 3.1 on admission. HL up to 0.68 this am. FMTS stopped the heparin gtt and to go back to Coumadin today with no bridge. Hgb 9.0, plts wnl.  Goal of Therapy:  Heparin level 0.3-0.7 units/ml Monitor platelets by anticoagulation protocol: Yes   Plan:  Give coumadin 5mg  PO x 1 tonight FMTS stopped heparin Monitor daily INR, CBC, s/s of bleed

## 2015-06-06 NOTE — Progress Notes (Signed)
Patient ID: [name], female   DOB: April 08, 1923, 82 y.o.   MRN: [id number] I spoke with Dr. Letta Pate earlier this evening regarding transfer to CIR. He expressed some concerns about her ostomy output and overall readiness for CIR admission. We agreed to cancel transfer today and re-evaluate in the AM. I was just notified by her nurse that there was some coffee ground output from her ostomy. I also spoke with her family member by phone who feels that while she has no new complaints, [name] does not have the energy she did in the ICU. Will check CBC, CMET, CXR. Family requests Dr. Kathreen Cosier come by in the AM as he has been seeing her this week and I assured them he would. [name] Skeans, MD, MPH, FACS Trauma: [phone] General Surgery: [phone]

## 2015-06-06 NOTE — Care Management Note (Signed)
Case Management Note  Patient Details  Name: [name] MRN: [id number] Date of Birth: Sep 14, 1923  Subjective/Objective:                    Action/Plan:   Expected Discharge Date              Expected Discharge Plan:  [place]  In-House Referral:     Discharge planning Services  CM Consult  Post Acute Care Choice:    Choice offered to:     DME Arranged:    DME Agency:     HH Arranged:    [place] Agency:     Status of Service:  In process, will continue to follow  Medicare Important Message Given:    Date Medicare IM Given:    Medicare IM give by:    Date Additional Medicare IM Given:    Additional Medicare Important Message give by:     If discussed at [place] of Stay Meetings, dates discussed:  06-06-15  Additional Comments:  Marilu Favre, RN 06/06/2015, 1:40 PM

## 2015-06-06 NOTE — Consult Note (Signed)
WOC ostomy follow up Stoma type/location: end colostomy, LLQ  Output liquid brown/green dark  Ostomy pouching: 1pc.with 2" barrier ring Education provided: patient is not feeling well at all today, request [facility] nurse to return tom am to change pouch. Supplies in the room  Enrolled patient in [facility] Start Discharge program: Yes   [facility] will follow along with you for continued support with ostomy teaching and care [place] RN,CWOCN [id number]

## 2015-06-06 NOTE — Progress Notes (Signed)
Report called to 4W inpatient rehab and given to [place], [place]. Patient ready for transfer. Family at bedside.

## 2015-06-06 NOTE — Progress Notes (Signed)
Rehab admissions - I spoke with attending MD.  Team to meet to discuss patient to see if patient ready for possible inpatient rehab admission today.  Will await call back from resident.  Call me for questions.  [id number]

## 2015-06-06 NOTE — Discharge Instructions (Addendum)
Staples out POD #14

## 2015-06-06 NOTE — Progress Notes (Signed)
Physical Therapy Treatment Patient Details Name: [name] MRN: [id number] DOB: [birth year] Today's Date: 06/06/2015    History of Present Illness 86 F with known a fib/tachy-brady syndrome s/p pacemaker and other multiple comorbidities who has had several recurrent admission for diverticulitis and presents now with the same. States pain has never really abated since last discharge. Also with profound weakness and fatigue; Diverticulitis with possible perforation / abscess; s/p Sigmoid colectomy, descending colostomy, small bowel resection, placement of pelvic abscess drain    PT Comments    Pt able to perform bed mobility and transfers with decreased A level.  She was fatigued with short distance gait in room, with decreased R step length today.  Encouraged pt to be OOB and will need to continue to increase functional mobility with goal of CIR.  Follow Up Recommendations  CIR;Supervision/Assistance - 24 hour;Supervision for mobility/OOB     Equipment Recommendations  None recommended by PT    Recommendations for Other Services       Precautions / Restrictions Precautions Precautions: Fall Precaution Comments: multiple lines, J-P drain Restrictions Weight Bearing Restrictions: No    Mobility  Bed Mobility Overal bed mobility: Needs Assistance Bed Mobility: Supine to Sit     Supine to sit: Min guard     General bed mobility comments: MIN/guard with HOB elevated and performed slowly  Transfers Overall transfer level: Needs assistance Equipment used: 2 person hand held assist Transfers: Sit to/from Stand Sit to Stand: Min assist         General transfer comment: MIN A to shift weight forward and she tends to stay on heels  Ambulation/Gait Ambulation/Gait assistance: Min assist;+2 physical assistance Ambulation Distance (Feet): 15 Feet Assistive device: 2 person hand held assist Gait Pattern/deviations: Decreased step length - right;Step-to  pattern;Shuffle Gait velocity: slow   General Gait Details: Pt had difficulty progressing R foot today.  She denied pain with gait, but reports feeling tired.  Encouragement to ambulate around the bed to recliner.   Stairs            Wheelchair Mobility    Modified Rankin (Stroke Patients Only)       Balance Overall balance assessment: Needs assistance Sitting-balance support: Feet supported Sitting balance-Leahy Scale: Good     Standing balance support: Bilateral upper extremity supported Standing balance-Leahy Scale: Poor Standing balance comment: UE support                    Cognition Arousal/Alertness: Awake/alert Behavior During Therapy: WFL for tasks assessed/performed Overall Cognitive Status: Within Functional Limits for tasks assessed                      Exercises      General Comments General comments (skin integrity, edema, etc.): No family present.      Pertinent Vitals/Pain Pain Assessment: Faces Faces Pain Scale: Hurts a little bit Pain Location: abdomen Pain Descriptors / Indicators: Grimacing Pain Intervention(s): Patient requesting pain meds-RN notified    Home Living                      Prior Function            PT Goals (current goals can now be found in the care plan section) Acute Rehab PT Goals Patient Stated Goal: Return to her home PT Goal Formulation: With patient Time For Goal Achievement: 06/16/15 Potential to Achieve Goals: Good Progress towards PT goals: Progressing toward goals  Frequency  Min 3X/week    PT Plan Current plan remains appropriate    Co-evaluation             End of Session   Activity Tolerance: Patient limited by fatigue Patient left: in chair;with call bell/phone within reach;with chair alarm set     Time: [id number] PT Time Calculation (min) (ACUTE ONLY): 18 min  Charges:  $Gait Training: 8-22 mins                    G Codes:      Aronda Burford  LUBECK 06/06/2015, 10:11 AM

## 2015-06-06 NOTE — Progress Notes (Signed)
6 Days Post-Op  Subjective: S/p Hartman's procedure for obstructing colon mass.  Doing well, although states that she feels "blah" this morning.  Tolerating diet although not eating much.  Has not been ambulating well.    Objective: Vital signs in last 24 hours: Temp:  [97.7 F (36.5 C)-98.9 F (37.2 C)] 97.7 F (36.5 C) (05/11 0451) Pulse Rate:  [82-89] 82 (05/11 0451) Resp:  [18-20] 18 (05/11 0451) BP: (121-128)/(59-67) 121/64 mmHg (05/11 0451) SpO2:  [95 %-100 %] 95 % (05/11 0451) Weight:  [74.435 kg (164 lb 1.6 oz)] 74.435 kg (164 lb 1.6 oz) (05/11 0429) Last BM Date: 06/05/15  Intake/Output from previous day: 05/10 0701 - 05/11 0700 In: 690 [P.O.:680; I.V.:10] Out: 4650 [Urine:4075; Drains:150; Stool:425] Intake/Output this shift:    General appearance: alert, cooperative and no distress GI: Soft.  Appropriately tender to palpation.  JP in RLQ with SS fluid in bulb.  Ostomy functioning with air and stool in appliance.  Lab Results:   Recent Labs  06/05/15 0430 06/06/15 0344  WBC 9.9 10.4  HGB 8.5* 9.0*  HCT 26.5* 27.6*  PLT 204 254   BMET  Recent Labs  06/05/15 1125 06/06/15 0344  NA 145 146*  K 3.2* 3.2*  CL 111 109  CO2 25 27  GLUCOSE 127* 144*  BUN 43* 37*  CREATININE 0.98 1.14*  CALCIUM 8.6* 8.7*   PT/INR No results for input(s): LABPROT, INR in the last 72 hours. ABG No results for input(s): PHART, HCO3 in the last 72 hours.  Invalid input(s): PCO2, PO2  Studies/Results: No results found.  Anti-infectives: Anti-infectives    Start     Dose/Rate Route Frequency Ordered Stop   06/05/15 1200  vancomycin (VANCOCIN) 50 mg/mL oral solution 125 mg     125 mg Oral Every 6 hours 06/05/15 0943     05/31/15 2000  metroNIDAZOLE (FLAGYL) IVPB 500 mg  Status:  Discontinued     500 mg 100 mL/hr over 60 Minutes Intravenous Every 8 hours 05/31/15 1852 06/05/15 0932   05/29/15 1200  piperacillin-tazobactam (ZOSYN) IVPB 3.375 g     3.375 g 12.5 mL/hr  over 240 Minutes Intravenous Every 8 hours 05/29/15 0943     05/27/15 1200  piperacillin-tazobactam (ZOSYN) IVPB 2.25 g  Status:  Discontinued     2.25 g 100 mL/hr over 30 Minutes Intravenous Every 6 hours 05/27/15 0832 05/29/15 0943   05/27/15 1200  vancomycin (VANCOCIN) 50 mg/mL oral solution 125 mg  Status:  Discontinued     125 mg Oral Every 6 hours 05/27/15 0957 05/31/15 1852   05/27/15 1000  metroNIDAZOLE (FLAGYL) IVPB 500 mg  Status:  Discontinued     500 mg 100 mL/hr over 60 Minutes Intravenous Every 8 hours 05/27/15 0957 05/28/15 1617   05/27/15 0600  cefoTEtan (CEFOTAN) 2 g in dextrose 5 % 50 mL IVPB  Status:  Discontinued     2 g 100 mL/hr over 30 Minutes Intravenous To ShortStay Surgical 05/26/15 0832 05/27/15 1051   05/26/15 1300  metroNIDAZOLE (FLAGYL) tablet 500 mg  Status:  Discontinued     500 mg Oral 3 times per day on Sun 05/26/15 0928 05/26/15 0931   05/26/15 1300  neomycin (MYCIFRADIN) tablet 1,000 mg     1,000 mg Oral 3 times per day on Sun 05/26/15 0930 05/26/15 2139   05/26/15 1300  metroNIDAZOLE (FLAGYL) tablet 1,000 mg     1,000 mg Oral 3 times per day on Sun 05/26/15 0931 05/26/15 2139  05/26/15 0832  metroNIDAZOLE (FLAGYL) tablet 500 mg  Status:  Discontinued    Comments:  Take 2 pills (=1000mg ) by mouth at 1pm, 3pm, and 10pm the day before your colorectal operation   500 mg Oral As directed 05/26/15 0832 05/26/15 0925   05/26/15 0832  neomycin (MYCIFRADIN) tablet 500 mg  Status:  Discontinued     500 mg Oral As directed 05/26/15 [id number] 05/26/15 0930   05/26/15 0815  fluconazole (DIFLUCAN) IVPB 200 mg  Status:  Discontinued     200 mg 100 mL/hr over 60 Minutes Intravenous Every 24 hours 05/26/15 0811 06/03/15 1555   05/26/15 0600  piperacillin-tazobactam (ZOSYN) IVPB 3.375 g  Status:  Discontinued     3.375 g 12.5 mL/hr over 240 Minutes Intravenous Every 8 hours 05/25/15 2155 05/27/15 0832   05/25/15 1900  piperacillin-tazobactam (ZOSYN) IVPB 3.375 g     3.375  g 100 mL/hr over 30 Minutes Intravenous  Once 05/25/15 1851 05/25/15 2002      Assessment/Plan: s/p Procedure(s): SIGMOID COLECTOMY (N/A) DESCENDING COLOSTOMY (N/A) SMALL BOWEL RESECTION (N/A) DRAINAGE OF PELVIC ABSCESS (N/A)  Perforated sigmoid diverticulitis with pelvic abscess and possible colovesical fistula POD#6 Sigmoid colectomy, descending colostomy, small bowel resection, placement of pelvic abscess drain--Dr. Georgette Dover -advance to regular. Encourage PO pain meds, could consider scheduled tylenol -WOC following -DC staples on POD#14 -Mobilize, pt and pmr consulted -Zosyn Day #9/10 -DC foley C.Diff colitis - flagyl Day #6 Afib-Heparin gtt, can transition to PO meds PCM-Would encourage supplements (Ensure) Dispo- not ready yet for DC, CIR  LOS: 12 days    [name] 06/06/2015

## 2015-06-06 NOTE — Progress Notes (Signed)
Nutrition Follow-up  DOCUMENTATION CODES:   Non-severe (moderate) malnutrition in context of chronic illness  INTERVENTION:   -D/c Boost Breeze po TID, each supplement provides 250 kcal and 9 grams of protein -Ensure Enlive po BID, each supplement provides 350 kcal and 20 grams of protein  NUTRITION DIAGNOSIS:   Inadequate oral intake related to altered GI function as evidenced by per patient/family report.  Ongoing  GOAL:   Patient will meet greater than or equal to 90% of their needs  Progressing  MONITOR:   PO intake, Supplement acceptance, Labs, Weight trends, Skin, I & O's  REASON FOR ASSESSMENT:   Consult New TPN/TNA  ASSESSMENT:   Pt readmitted for diverticulitis w/ microperforation and colovesical fistula. S/P sigmoid colectomy and descending colostomy on 5/5.   Pt sleeping soundly at time of visit; RD did not wake.   Pt has been advanced to a regular diet. Intake variable; PO: 0-80%. Staff report pt is not eating much. Pt refused most recent dose of Boost Breeze.   TPN was d/c on 06/05/15.   Pt remains with LUQ colostomy noted about 375 ml output within the past 24 hours.   CIR admissions team following; potential discharge to CIR later today.   Labs reviewed: Na: 146, K: 3.2, CBGS: 123-150.   Diet Order:  Diet regular Room service appropriate?: Yes; Fluid consistency:: Thin Diet - low sodium heart healthy  Skin:  Reviewed, no issues  Last BM:  06/05/15  Height:   Ht Readings from Last 1 Encounters:  05/25/15 5\' 5"  (1.651 m)    Weight:   Wt Readings from Last 1 Encounters:  06/06/15 164 lb 1.6 oz (74.435 kg)    Ideal Body Weight:  56.8 kg  BMI:  Body mass index is 27.31 kg/(m^2).  Estimated Nutritional Needs:   Kcal:  1500-1700  Protein:  70-80 gm  Fluid:  1.5-1.7 L  EDUCATION NEEDS:   No education needs identified at this time  Densil Ottey A. Jimmye Norman, RD, LDN, CDE Pager: [phone] After hours Pager: [phone]

## 2015-06-07 ENCOUNTER — Inpatient Hospital Stay (HOSPITAL_COMMUNITY): Payer: Medicare Other

## 2015-06-07 ENCOUNTER — Inpatient Hospital Stay (HOSPITAL_COMMUNITY): Payer: Medicare Other | Admitting: Occupational Therapy

## 2015-06-07 LAB — GLUCOSE, CAPILLARY
GLUCOSE-CAPILLARY: 105 mg/dL — AB (ref 65–99)
GLUCOSE-CAPILLARY: 153 mg/dL — AB (ref 65–99)
GLUCOSE-CAPILLARY: 143 mg/dL — AB (ref 65–99)
GLUCOSE-CAPILLARY: 136 mg/dL — AB (ref 65–99)
Glucose-Capillary: 114 mg/dL — ABNORMAL HIGH (ref 65–99)
Glucose-Capillary: 120 mg/dL — ABNORMAL HIGH (ref 65–99)
Glucose-Capillary: 148 mg/dL — ABNORMAL HIGH (ref 65–99)

## 2015-06-07 LAB — BASIC METABOLIC PANEL
Anion gap: 13 (ref 5–15)
BUN: 41 mg/dL — AB (ref 6–20)
CO2: 25 mmol/L (ref 22–32)
Calcium: 8.9 mg/dL (ref 8.9–10.3)
Chloride: 107 mmol/L (ref 101–111)
Creatinine, Ser: 1.22 mg/dL — ABNORMAL HIGH (ref 0.44–1.00)
GFR calc Af Amer: 44 mL/min — ABNORMAL LOW (ref >60)
GFR, EST NON AFRICAN AMERICAN: 38 mL/min — AB (ref >60)
Glucose, Bld: 113 mg/dL — ABNORMAL HIGH (ref 65–99)
POTASSIUM: 3.6 mmol/L (ref 3.5–5.1)
SODIUM: 145 mmol/L (ref 135–145)

## 2015-06-07 LAB — CBC
HCT: 27.8 % — ABNORMAL LOW (ref 36.0–46.0)
Hemoglobin: 8.6 g/dL — ABNORMAL LOW (ref 12.0–15.0)
MCH: 25.1 pg — AB (ref 26.0–34.0)
MCHC: 30.9 g/dL (ref 30.0–36.0)
MCV: 81.3 fL (ref 78.0–100.0)
PLATELETS: 269 10*3/uL (ref 150–400)
RBC: 3.42 MIL/uL — AB (ref 3.87–5.11)
RDW: 17.7 % — ABNORMAL HIGH (ref 11.5–15.5)
WBC: 9.2 10*3/uL (ref 4.0–10.5)

## 2015-06-07 LAB — PROTIME-INR
INR: 1.21 (ref 0.00–1.49)
Prothrombin Time: 15.4 seconds — ABNORMAL HIGH (ref 11.6–15.2)

## 2015-06-07 LAB — HEMOGLOBIN AND HEMATOCRIT, BLOOD
HCT: 27.8 % — ABNORMAL LOW (ref 36.0–46.0)
HEMOGLOBIN: 8.5 g/dL — AB (ref 12.0–15.0)

## 2015-06-07 MED ORDER — BOOST / RESOURCE BREEZE PO LIQD
1.0000 | Freq: Three times a day (TID) | ORAL | Status: DC
  Administered 2015-06-08 – 2015-06-10 (×8): 1 via ORAL

## 2015-06-07 MED ORDER — WARFARIN SODIUM 5 MG PO TABS: 5.0000 mg | ORAL_TABLET | Freq: Once | ORAL | Status: DC

## 2015-06-07 MED ORDER — SACCHAROMYCES BOULARDII 250 MG PO CAPS
250.0000 mg | ORAL_CAPSULE | Freq: Two times a day (BID) | ORAL | Status: DC
  Administered 2015-06-07 – 2015-06-14 (×13): 250 mg via ORAL
  Filled 2015-06-07 (×15): qty 1

## 2015-06-07 NOTE — Progress Notes (Signed)
Patient ID: [name], female   DOB: [birth year], 82 y.o.   MRN: [id number]     [address] [place], [place] [address]    Phone: [phone] FAX: [phone]     Subjective: Vomited last night at [place] lousy, no energy.  Cant explain, denies any increase in abdominal pain.  Labs are stable. VSS.  Ostomy is functioning.   Objective:  Vital signs:  Filed Vitals:   06/06/15 0451 06/06/15 1508 06/06/15 2008 06/07/15 0411  BP: 121/64 120/67 119/77 119/51  Pulse: 82 80 80 80  Temp: 97.7 F (36.5 C) 97.7 F (36.5 C) 98 F (36.7 C) 98.2 F (36.8 C)  TempSrc: Oral Oral Oral Oral  Resp: '18 18 19 18  '$ Height:      Weight:    72.986 kg (160 lb 14.5 oz)  SpO2: 95% 96% 96% 96%    Last BM Date: 06/05/15  Intake/Output   Yesterday:  05/11 0701 - 05/12 0700 In: 830 [P.O.:720; I.V.:10; IV Piggyback:100] Out: 2515 [Urine:1875; Emesis/NG output:200; Drains:140; Stool:300] This shift:    I/O last 3 completed shifts: In: 1448 [P.O.:1080; I.V.:10; IV Piggyback:100] Out: 1856 [Urine:3700; Emesis/NG output:200; Drains:290; Stool:675]    Physical Exam: General: Pt awake/alert/oriented x4 in no acute distress Abdomen: Soft. Nondistended. llq ostomy functioning, stoma is viable. Midline wound with staples in place.  No evidence of peritonitis. No incarcerated hernias.     Problem List:   Principal Problem:   Diverticulitis of intestine with perforation and abscess Active Problems:   Chronic anticoagulation - Coumadin, CHADS2VASC=7   HTN (hypertension)   Pacemaker   Diabetes ([place])   History of CVA (cerebrovascular accident)   Tachycardia-bradycardia syndrome with symptomatic bradycardia   Chronic atrial fibrillation (HCC)   Diverticulitis   Supratherapeutic INR   CKD (chronic kidney disease) stage 3, GFR 30-59 ml/min   HOH (hard of hearing)   Protein-calorie malnutrition, moderate (HCC)    Diverticulitis of large intestine with perforation and abscess without bleeding   Status post partial colectomy   Ileus, postoperative   Postoperative pain   Chronic diastolic congestive heart failure (HCC)   History of CVA with residual deficit   Respiratory depression   Stool culture positive for Clostridium difficile   On total parenteral nutrition (TPN)   Urinary retention   Leukocytosis   Tachycardia   Acute blood loss anemia   AKI (acute kidney injury) ([place])    Results:   Labs: Results for orders placed or performed during the hospital encounter of 05/25/15 (from the past 48 hour(s))  Basic metabolic panel     Status: Abnormal   Collection Time: 06/05/15 11:25 AM  Result Value Ref Range   Sodium 145 135 - 145 mmol/L   Potassium 3.2 (L) 3.5 - 5.1 mmol/L   Chloride 111 101 - 111 mmol/L   CO2 25 22 - 32 mmol/L   Glucose, Bld 127 (H) 65 - 99 mg/dL   BUN 43 (H) 6 - 20 mg/dL   Creatinine, Ser 0.98 0.44 - 1.00 mg/dL   Calcium 8.6 (L) 8.9 - 10.3 mg/dL   GFR calc non Af Amer 49 (L) >60 mL/min   GFR calc Af Amer 57 (L) >60 mL/min    Comment: (NOTE) The eGFR has been calculated using the CKD EPI equation. This calculation has not been validated in all clinical situations. eGFR's persistently <60 mL/min signify possible Chronic Kidney Disease.  Anion gap 9 5 - 15  Glucose, capillary     Status: Abnormal   Collection Time: 06/05/15 12:39 PM  Result Value Ref Range   Glucose-Capillary 112 (H) 65 - 99 mg/dL  Heparin level (unfractionated)     Status: None   Collection Time: 06/05/15  2:30 PM  Result Value Ref Range   Heparin Unfractionated 0.40 0.30 - 0.70 IU/mL    Comment:        IF HEPARIN RESULTS ARE BELOW EXPECTED VALUES, AND PATIENT DOSAGE HAS BEEN CONFIRMED, SUGGEST FOLLOW UP TESTING OF ANTITHROMBIN III LEVELS.   Glucose, capillary     Status: Abnormal   Collection Time: 06/05/15  5:33 PM  Result Value Ref Range   Glucose-Capillary 103 (H) 65 - 99 mg/dL   Glucose, capillary     Status: Abnormal   Collection Time: 06/05/15  8:22 PM  Result Value Ref Range   Glucose-Capillary 116 (H) 65 - 99 mg/dL  Glucose, capillary     Status: Abnormal   Collection Time: 06/06/15 12:09 AM  Result Value Ref Range   Glucose-Capillary 127 (H) 65 - 99 mg/dL  Comprehensive metabolic panel     Status: Abnormal   Collection Time: 06/06/15  3:44 AM  Result Value Ref Range   Sodium 146 (H) 135 - 145 mmol/L   Potassium 3.2 (L) 3.5 - 5.1 mmol/L   Chloride 109 101 - 111 mmol/L   CO2 27 22 - 32 mmol/L   Glucose, Bld 144 (H) 65 - 99 mg/dL   BUN 37 (H) 6 - 20 mg/dL   Creatinine, Ser 1.14 (H) 0.44 - 1.00 mg/dL   Calcium 8.7 (L) 8.9 - 10.3 mg/dL   Total Protein 4.9 (L) 6.5 - 8.1 g/dL   Albumin 1.7 (L) 3.5 - 5.0 g/dL   AST 22 15 - 41 U/L   ALT 11 (L) 14 - 54 U/L   Alkaline Phosphatase 71 38 - 126 U/L   Total Bilirubin 0.6 0.3 - 1.2 mg/dL   GFR calc non Af Amer 41 (L) >60 mL/min   GFR calc Af Amer 48 (L) >60 mL/min    Comment: (NOTE) The eGFR has been calculated using the CKD EPI equation. This calculation has not been validated in all clinical situations. eGFR's persistently <60 mL/min signify possible Chronic Kidney Disease.    Anion gap 10 5 - 15  Magnesium     Status: None   Collection Time: 06/06/15  3:44 AM  Result Value Ref Range   Magnesium 1.8 1.7 - 2.4 mg/dL  Phosphorus     Status: None   Collection Time: 06/06/15  3:44 AM  Result Value Ref Range   Phosphorus 4.4 2.5 - 4.6 mg/dL  CBC     Status: Abnormal   Collection Time: 06/06/15  3:44 AM  Result Value Ref Range   WBC 10.4 4.0 - 10.5 K/uL   RBC 3.37 (L) 3.87 - 5.11 MIL/uL   Hemoglobin 9.0 (L) 12.0 - 15.0 g/dL   HCT 27.6 (L) 36.0 - 46.0 %   MCV 81.9 78.0 - 100.0 fL   MCH 26.7 26.0 - 34.0 pg   MCHC 32.6 30.0 - 36.0 g/dL   RDW 17.4 (H) 11.5 - 15.5 %   Platelets 254 150 - 400 K/uL  Heparin level (unfractionated)     Status: None   Collection Time: 06/06/15  3:44 AM  Result Value Ref  Range   Heparin Unfractionated 0.68 0.30 - 0.70 IU/mL    Comment:  IF HEPARIN RESULTS ARE BELOW EXPECTED VALUES, AND PATIENT DOSAGE HAS BEEN CONFIRMED, SUGGEST FOLLOW UP TESTING OF ANTITHROMBIN III LEVELS.   Glucose, capillary     Status: Abnormal   Collection Time: 06/06/15  4:23 AM  Result Value Ref Range   Glucose-Capillary 150 (H) 65 - 99 mg/dL  Glucose, capillary     Status: Abnormal   Collection Time: 06/06/15  7:47 AM  Result Value Ref Range   Glucose-Capillary 123 (H) 65 - 99 mg/dL  Glucose, capillary     Status: Abnormal   Collection Time: 06/06/15 11:58 AM  Result Value Ref Range   Glucose-Capillary 109 (H) 65 - 99 mg/dL  Glucose, capillary     Status: Abnormal   Collection Time: 06/06/15  3:54 PM  Result Value Ref Range   Glucose-Capillary 131 (H) 65 - 99 mg/dL  Glucose, capillary     Status: Abnormal   Collection Time: 06/06/15  7:57 PM  Result Value Ref Range   Glucose-Capillary 117 (H) 65 - 99 mg/dL  Urinalysis, Routine w reflex microscopic (not at [facility])     Status: None   Collection Time: 06/06/15  8:49 PM  Result Value Ref Range   Color, Urine YELLOW YELLOW   APPearance CLEAR CLEAR   Specific Gravity, Urine 1.018 1.005 - 1.030   pH 5.0 5.0 - 8.0   Glucose, UA NEGATIVE NEGATIVE mg/dL   Hgb urine dipstick NEGATIVE NEGATIVE   Bilirubin Urine NEGATIVE NEGATIVE   Ketones, ur NEGATIVE NEGATIVE mg/dL   Protein, ur NEGATIVE NEGATIVE mg/dL   Nitrite NEGATIVE NEGATIVE   Leukocytes, UA NEGATIVE NEGATIVE    Comment: MICROSCOPIC NOT DONE ON URINES WITH NEGATIVE PROTEIN, BLOOD, LEUKOCYTES, NITRITE, OR GLUCOSE <1000 mg/dL.  Glucose, capillary     Status: Abnormal   Collection Time: 06/07/15 12:04 AM  Result Value Ref Range   Glucose-Capillary 114 (H) 65 - 99 mg/dL  Glucose, capillary     Status: Abnormal   Collection Time: 06/07/15  4:09 AM  Result Value Ref Range   Glucose-Capillary 105 (H) 65 - 99 mg/dL  CBC     Status: Abnormal   Collection Time:  06/07/15  4:40 AM  Result Value Ref Range   WBC 9.2 4.0 - 10.5 K/uL   RBC 3.42 (L) 3.87 - 5.11 MIL/uL   Hemoglobin 8.6 (L) 12.0 - 15.0 g/dL   HCT 27.8 (L) 36.0 - 46.0 %   MCV 81.3 78.0 - 100.0 fL   MCH 25.1 (L) 26.0 - 34.0 pg   MCHC 30.9 30.0 - 36.0 g/dL   RDW 17.7 (H) 11.5 - 15.5 %   Platelets 269 150 - 400 K/uL  Basic metabolic panel     Status: Abnormal   Collection Time: 06/07/15  4:40 AM  Result Value Ref Range   Sodium 145 135 - 145 mmol/L   Potassium 3.6 3.5 - 5.1 mmol/L   Chloride 107 101 - 111 mmol/L   CO2 25 22 - 32 mmol/L   Glucose, Bld 113 (H) 65 - 99 mg/dL   BUN 41 (H) 6 - 20 mg/dL   Creatinine, Ser 1.22 (H) 0.44 - 1.00 mg/dL   Calcium 8.9 8.9 - 10.3 mg/dL   GFR calc non Af Amer 38 (L) >60 mL/min   GFR calc Af Amer 44 (L) >60 mL/min    Comment: (NOTE) The eGFR has been calculated using the CKD EPI equation. This calculation has not been validated in all clinical situations. eGFR's persistently <60 mL/min signify possible Chronic  Kidney Disease.    Anion gap 13 5 - 15  Protime-INR     Status: Abnormal   Collection Time: 06/07/15  4:40 AM  Result Value Ref Range   Prothrombin Time 15.4 (H) 11.6 - 15.2 seconds   INR 1.21 0.00 - 1.49  Glucose, capillary     Status: Abnormal   Collection Time: 06/07/15  7:44 AM  Result Value Ref Range   Glucose-Capillary 120 (H) 65 - 99 mg/dL    Imaging / Studies: Dg Chest Port 1 View  06/06/2015  CLINICAL DATA:  Pneumonia. EXAM: PORTABLE CHEST 1 VIEW COMPARISON:  05/28/2015 FINDINGS: Left arm PICC line and transvenous pacemaker remain in stable position. Marked cardiac enlargement remains stable. Persistent mild atelectasis or infiltrate seen in the medial right lung base. No definite pleural effusion or pneumothorax visualized on this portable exam. IMPRESSION: Stable cardiomegaly. Persistent mild atelectasis or infiltrate in medial right lung base. Electronically Signed   By: Earle Gell M.D.   On: 06/06/2015 23:42     Medications / Allergies:  Scheduled Meds: . antiseptic oral rinse  7 mL Mouth Rinse q12n4p  . chlorhexidine  15 mL Mouth Rinse BID  . feeding supplement (ENSURE ENLIVE)  237 mL Oral BID BM  . insulin aspart  0-15 Units Subcutaneous Q4H  . lip balm   Topical BID  . pantoprazole  40 mg Oral Daily  . piperacillin-tazobactam (ZOSYN)  IV  3.375 g Intravenous Q8H  . potassium chloride  40 mEq Oral Daily  . saccharomyces boulardii  250 mg Oral BID  . sodium chloride flush  10-40 mL Intracatheter Q12H  . vancomycin  125 mg Oral Q6H  . verapamil  120 mg Oral Daily  . Warfarin - Pharmacist Dosing Inpatient   Does not apply q1800   Continuous Infusions: . lactated ringers 0 mL/hr at 05/31/15 1159   PRN Meds:.alum & mag hydroxide-simeth, benzocaine-Menthol, magic mouthwash, menthol-cetylpyridinium, morphine injection, ondansetron **OR** ondansetron (ZOFRAN) IV, oxyCODONE-acetaminophen, prochlorperazine, silver sulfADIAZINE, sodium chloride flush, sodium chloride flush, traMADol  Antibiotics: Anti-infectives    Start     Dose/Rate Route Frequency Ordered Stop   06/06/15 0000  piperacillin-tazobactam (ZOSYN) 3.375 GM/50ML IVPB     3.375 g 12.5 mL/hr over 240 Minutes Intravenous Every 8 hours 06/06/15 1338     06/06/15 0000  vancomycin (VANCOCIN) 50 mg/mL oral solution        06/06/15 1338     06/05/15 1200  vancomycin (VANCOCIN) 50 mg/mL oral solution 125 mg     125 mg Oral Every 6 hours 06/05/15 0943     05/31/15 2000  metroNIDAZOLE (FLAGYL) IVPB 500 mg  Status:  Discontinued     500 mg 100 mL/hr over 60 Minutes Intravenous Every 8 hours 05/31/15 1852 06/05/15 0932   05/29/15 1200  piperacillin-tazobactam (ZOSYN) IVPB 3.375 g     3.375 g 12.5 mL/hr over 240 Minutes Intravenous Every 8 hours 05/29/15 0943     05/27/15 1200  piperacillin-tazobactam (ZOSYN) IVPB 2.25 g  Status:  Discontinued     2.25 g 100 mL/hr over 30 Minutes Intravenous Every 6 hours 05/27/15 0832 05/29/15 0943    05/27/15 1200  vancomycin (VANCOCIN) 50 mg/mL oral solution 125 mg  Status:  Discontinued     125 mg Oral Every 6 hours 05/27/15 0957 05/31/15 1852   05/27/15 1000  metroNIDAZOLE (FLAGYL) IVPB 500 mg  Status:  Discontinued     500 mg 100 mL/hr over 60 Minutes Intravenous Every 8 hours 05/27/15 0957  05/28/15 1617   05/27/15 0600  cefoTEtan (CEFOTAN) 2 g in dextrose 5 % 50 mL IVPB  Status:  Discontinued     2 g 100 mL/hr over 30 Minutes Intravenous To [facility] 05/26/15 0832 05/27/15 1051   05/26/15 1300  metroNIDAZOLE (FLAGYL) tablet 500 mg  Status:  Discontinued     500 mg Oral 3 times per day on Sun 05/26/15 0928 05/26/15 0931   05/26/15 1300  neomycin (MYCIFRADIN) tablet 1,000 mg     1,000 mg Oral 3 times per day on Sun 05/26/15 0930 05/26/15 2139   05/26/15 1300  metroNIDAZOLE (FLAGYL) tablet 1,000 mg     1,000 mg Oral 3 times per day on Sun 05/26/15 0931 05/26/15 2139   05/26/15 0832  metroNIDAZOLE (FLAGYL) tablet 500 mg  Status:  Discontinued    Comments:  Take 2 pills (='1000mg'$ ) by mouth at 1pm, 3pm, and 10pm the day before your colorectal operation   500 mg Oral As directed 05/26/15 0832 05/26/15 0925   05/26/15 0832  neomycin (MYCIFRADIN) tablet 500 mg  Status:  Discontinued     500 mg Oral As directed 05/26/15 6720 05/26/15 0930   05/26/15 0815  fluconazole (DIFLUCAN) IVPB 200 mg  Status:  Discontinued     200 mg 100 mL/hr over 60 Minutes Intravenous Every 24 hours 05/26/15 0811 06/03/15 1555   05/26/15 0600  piperacillin-tazobactam (ZOSYN) IVPB 3.375 g  Status:  Discontinued     3.375 g 12.5 mL/hr over 240 Minutes Intravenous Every 8 hours 05/25/15 2155 05/27/15 0832   05/25/15 1900  piperacillin-tazobactam (ZOSYN) IVPB 3.375 g     3.375 g 100 mL/hr over 30 Minutes Intravenous  Once 05/25/15 1851 05/25/15 2002        Assessment/Plan Perforated sigmoid diverticulitis with pelvic abscess and possible colovesical fistula POD#7 Sigmoid colectomy, descending colostomy,  small bowel resection, placement of pelvic abscess drain--Dr. Georgette Dover -check axr for vomiting, although ostomy is functioning.  Not clear on patients regression.  WBC is normal, hgb down a bit but not significant.  Further work up per primary team.  i spoke with her family at bedside.  -WOC following -DC staples on POD#14 -Mobilize, pt and pmr consulted ID-per medicine/ID, none needed from a surgical standpoint.  Afib-coumadin started.  No bloody output from ostomy.  hgb is fairly stable, continue to monitor.  PCM-Would encourage supplements (Ensure). Will start a calorie count Dispo- await AXR  Erby Pian, ANP-BC [facility] Pager [phone](7A-4:30P) For consults and floor pages call [phone](7A-4:30P)  06/07/2015 8:14 AM

## 2015-06-07 NOTE — Progress Notes (Signed)
Family Medicine Teaching Service Daily Progress Note Intern Pager: [phone]  Patient name: [name] Medical record number: [id number] Date of birth: 29-Jan-1923 Age: 82 y.o. Gender: female  Primary Care Provider: [name], MD Consultants: ID, surgery  Code Status: Full   Pt Overview and Major Events to Date:  4/28: Readmit for Diverticulitis with possible perforation / abscess 5/5: Hartmann's procedure  Assessment and Plan: [name] is a 82 y.o. female presenting with LLQ abdominal pain. PMH is significant for atrial fibrillation, hx of CVA, hypertension, type 2 diabetes, tachycardia-bradycardia syndrome s/p pacemaker, prior pericardial effusion s/p pericardial window 12/16, sleep apnea (no CPAP), stroke (right frontal 2013), GI bleeding with hemorrhoids, and arthritis and recurrent diverticulitis.  # S/p Hartmann procedure on 5/5  following recurrent diverticulitis possibly perforation / abscess: Abdominal X ray now showing patient with ileus. Also bleeding from ostomy per surgery  - Antibiotics regiment per ID  2 weeks of zosyn post surgery (end 5-19) Vancomycin 125 mg PO q6hrs ( end 5/19)  Then, at the end of 2 weeks prolonged taper of po vanco 125mg  bid for 1 week 125mg  qdy for 1 week 125mg  qoday for 2 weeks.  -  Stop opioids due to ileus  - Switch to clear liquid diet  - Surgery recs  # LE edema. On Demadex 80 mg daily at home. Lower extremity edema improved. Watch kidney function  - No home torsemide for now as patient with decreased PO intake and bump in SCr  - Continue BMETs, Scr 0.98> 1.14>1.22 - Strict I/Os  # C.difficule positive: Ostomy in place.  Having stool output. - PO vancomycin 150 mg QID (5/1 > 5/5),  IV flagyl 5/6- 5/9,  - PO vancomycin 150 mg QID (5/10>>), regiment as above  - Contact precautions   # Chronic Atrial fibrillation: Ventricular Pacemaker for AF. INR 1.21 currently  - Continue Verapamil and Digoxin  - Continue Warfarin  for now, discuss benefits vs. Risk of stopping blood thinner, Per surgery recommend stopping   # DM2: Diet controlled. A1c 6.2 (12/2014)> 6.3  - monitor cbgs; SSI sensitive   # Anemia: Apparently bleeding from ostomy per surgery later this morning. Hgb 10.4 on admit (baseline 11). History of diverticulitis and hemorrhoids - Slight drop in hgb from 9.8 > >>9.0 >8.6, relatively stable  - AM CBC   #CKD Stage 3: Baseline Cr 1-1.2  -stable   FEN/GI: Regular; SLIV,  Prophylaxis: Warfarin   Disposition: Med-surg  Subjective:  Patient does not have an appetite this morning. She is still feeling weak. Last night she was slightly nauseated and had an episode of vomiting.   Objective: Temp:  [97.7 F (36.5 C)-98.2 F (36.8 C)] 98.2 F (36.8 C) (05/12 0411) Pulse Rate:  [80] 80 (05/12 0411) Resp:  [18-19] 18 (05/12 0411) BP: (119-120)/(51-77) 119/51 mmHg (05/12 0411) SpO2:  [96 %] 96 % (05/12 0411) Weight:  [160 lb 14.5 oz (72.986 kg)] 160 lb 14.5 oz (72.986 kg) (05/12 0411) Physical Exam: General: NAD, lying in bed Cardiovascular: regular rate and rhythm with 2/6 murmur, paced rhythm  Respiratory: CTAB, no increased WOB  Abdomen: Soft, colostomy bag in place with dark brown stool in bag, BS+  Ext: LE edema much improved  Laboratory:  Recent Labs Lab 06/05/15 0430 06/06/15 0344 06/07/15 0440  WBC 9.9 10.4 9.2  HGB 8.5* 9.0* 8.6*  HCT 26.5* 27.6* 27.8*  PLT 204 254 269    Recent Labs Lab 06/03/15 0513  06/05/15 1125 06/06/15  0344 06/07/15 0440  NA 143  < > 145 146* 145  K 3.8  < > 3.2* 3.2* 3.6  CL 111  < > 111 109 107  CO2 22  < > 25 27 25   BUN 47*  < > 43* 37* 41*  CREATININE 1.27*  < > 0.98 1.14* 1.22*  CALCIUM 8.7*  < > 8.6* 8.7* 8.9  PROT 5.2*  --   --  4.9*  --   BILITOT 0.4  --   --  0.6  --   ALKPHOS 73  --   --  71  --   ALT 13*  --   --  11*  --   AST 15  --   --  22  --   GLUCOSE 152*  < > 127* 144* 113*  < > = values in this interval not  displayed.  Imaging/Diagnostic Tests: Dg Chest Port 1 View  06/06/2015  CLINICAL DATA:  Pneumonia. EXAM: PORTABLE CHEST 1 VIEW COMPARISON:  05/28/2015 FINDINGS: Left arm PICC line and transvenous pacemaker remain in stable position. Marked cardiac enlargement remains stable. Persistent mild atelectasis or infiltrate seen in the medial right lung base. No definite pleural effusion or pneumothorax visualized on this portable exam. IMPRESSION: Stable cardiomegaly. Persistent mild atelectasis or infiltrate in medial right lung base. Electronically Signed   By: Earle Gell M.D.   On: 06/06/2015 23:42   Dg Abd 2 Views  06/07/2015  CLINICAL DATA:  Emesis. EXAM: ABDOMEN - 2 VIEW COMPARISON:  05/30/2015 FINDINGS: Gaseous distension of the bowel loops are identified. This predominantly involves the small bowel loops which measure up to 4.6 cm. Numerous air-fluid levels are identified on the left lateral decubitus radiograph. IMPRESSION: 1. Small bowel dilatation with air-fluid levels. Findings may reflect small bowel obstruction versus ileus. Electronically Signed   By: Kerby Moors M.D.   On: 06/07/2015 11:45    Tenise Stetler Cletis Media, MD 06/07/2015, 12:04 PM PGY-1, [place] Intern pager: [phone], text pages welcome

## 2015-06-07 NOTE — Progress Notes (Signed)
ANTICOAGULATION CONSULT NOTE - Follow Up Consult  Pharmacy Consult for Coumadin Indication: atrial fibrillation  Allergies  Allergen Reactions  . Anti-Inflammatory Enzyme [Nutritional Supplements] Other (See Comments)    Retains fluids and headaches  . Iodinated Diagnostic Agents Hives    HIVES 15MIN S/P IV CONTRAST INJECTION,WILL NEED 13 HR PREP FOR FUTURE INJECTIONS, ok s/p 50mg  po benadryl//a.calhoun  . Spironolactone Other (See Comments)    Hair loss  . Arthrotec [Diclofenac-Misoprostol] Other (See Comments)    unknown  . Biaxin [Clarithromycin] Other (See Comments)    unknown  . Plavix [Clopidogrel Bisulfate] Other (See Comments)    unknown   Patient Measurements: Height: 5\' 5"  (165.1 cm) (05/25/15 @ 1352) Weight: 160 lb 14.5 oz (72.986 kg) IBW/kg (Calculated) : 57  Vital Signs: Temp: 98.2 F (36.8 C) (05/12 0411) Temp Source: Oral (05/12 0411) BP: 119/51 mmHg (05/12 0411) Pulse Rate: 80 (05/12 0411)  Labs:  Recent Labs  06/05/15 0430 06/05/15 1125 06/05/15 1430 06/06/15 0344 06/07/15 0440  HGB 8.5*  --   --  9.0* 8.6*  HCT 26.5*  --   --  27.6* 27.8*  PLT 204  --   --  254 269  LABPROT  --   --   --   --  15.4*  INR  --   --   --   --  1.21  HEPARINUNFRC 0.21*  --  0.40 0.68  --   CREATININE  --  0.98  --  1.14* 1.22*    Estimated Creatinine Clearance: 30.7 mL/min (by C-G formula based on Cr of 1.22).  Assessment: 82 yo F with hx afib (Coumadin 2.5mg  daily exc 5mg  on Sun/Tues/Thurs PTA) INR was 3.1 on admission. Transitioned to heparin and now to go back to Coumadin. Heparin stopped and Coumadin restarted on 5/11. INR this am is low at 1.21. Hgb low at 8.6 and plts wnl.  Goal of Therapy:  Heparin level 0.3-0.7 units/ml Monitor platelets by anticoagulation protocol: Yes   Plan:  Give coumadin 5mg  PO x 1 tonight Consider need for heparin bridge? Monitor daily INR, CBC, s/s of bleed  Elenor Quinones, [name], [facility] Clinical Pharmacist Pager  [phone] 06/07/2015 9:46 AM

## 2015-06-07 NOTE — Progress Notes (Addendum)
Calorie Count Note  48 hour calorie count ordered.  Diet: Regular Supplements: Ensure Enlive po BID, each supplement provides 350 kcal and 20 grams of protein  Case discussed with RN. She confirms that pt is eating very little. She has been proving Ensure supplements, however, intake has been overall minimal.   Spoke with family members at bedside. Pt daughter estimates pt consumed only about 2 bites of scrambled eggs this morning (40 kcals, 2 grams of protein). Pt also consume about 2/3 of Ensure supplement. Family reports they try to wake pt up about every 30 minutes to encourage sips of Ensure. Pt has been having some nausea with oral intake.  ADDENDUM: Pt has been downgraded to a clear liquid diet. Will d/c Ensure Enlive and substitute with Boost Breeze po TID, each supplement provides 250 kcal and 9 grams of protein.   Nutrition Dx: Inadequate oral intake related to altered GI function as evidenced by per patient/family report; ongoing  Goal: Patient will meet greater than or equal to 90% of their needs; progressing  Intervention:  -Boost Breeze po TID, each supplement provides 250 kcal and 9 grams of protein -RD will follow up with calorie count results on Monday, 06/10/15  [name] A. Jimmye Norman, RD, LDN, CDE Pager: [phone] After hours Pager: [phone]

## 2015-06-07 NOTE — Progress Notes (Signed)
Physical Therapy Treatment Patient Details Name: [name] MRN: [id number] DOB: 1923-09-07 Today's Date: 06/07/2015    History of Present Illness 41 F with known a fib/tachy-brady syndrome s/p pacemaker and other multiple comorbidities who has had several recurrent admission for diverticulitis and presents now with the same. States pain has never really abated since last discharge. Also with profound weakness and fatigue; Diverticulitis with possible perforation / abscess; s/p Sigmoid colectomy, descending colostomy, small bowel resection, placement of pelvic abscess drain    PT Comments    Pt performed increased activity and fatigues quickly.  Nursing present to give meds post tx.      CIR;Supervision/Assistance - 24 hour;Supervision for mobility/OOB     Equipment Recommendations  None recommended by PT    Recommendations for Other Services Rehab consult     Precautions / Restrictions Precautions Precautions: Fall Restrictions Weight Bearing Restrictions: No    Mobility  Bed Mobility Overal bed mobility: Needs Assistance Bed Mobility: Supine to Sit     Supine to sit: Min guard     General bed mobility comments: Cues for hand placement and LE advancement to edge of bed.    Transfers Overall transfer level: Needs assistance Equipment used: Rolling walker (2 wheeled) Transfers: Sit to/from Stand Sit to Stand: Min assist Stand pivot transfers: Min assist       General transfer comment: Pt performed sit to stand from edge of bed x2 and from [facility] x1.  Pt required cues for hand placement to and from seated surface.  Sits with hands on RW despite cueing.    Ambulation/Gait Ambulation/Gait assistance: Min assist Ambulation Distance (Feet): 18 Feet Assistive device: Rolling walker (2 wheeled) Gait Pattern/deviations: Decreased stride length;Step-to pattern;Shuffle;Trunk flexed Gait velocity: slow   General Gait Details: Pt required encouragement to advance gait  distance.  Pt required decreased assist with use of RW.     Stairs            Wheelchair Mobility    Modified Rankin (Stroke Patients Only)       Balance                                    Cognition Arousal/Alertness: Awake/alert Behavior During Therapy: WFL for tasks assessed/performed Overall Cognitive Status: Within Functional Limits for tasks assessed                 General Comments: Pt able to verbalize needing to go to the bathroom.      Exercises General Exercises - Lower Extremity Ankle Circles/Pumps: AROM;Both;20 reps;Supine Quad Sets: AROM;Both;10 reps;Supine Heel Slides: AROM;Both;10 reps;Supine;AAROM (AAROM with LLE.  ) Hip ABduction/ADduction: AROM;AAROM;Both;10 reps;Supine (AAROM with LLE.  ) Straight Leg Raises: AROM;Both;10 reps;Supine;AAROM (AAROM with LLE (extensor lag noted). )    General Comments        Pertinent Vitals/Pain Pain Assessment: Faces Pain Score: 7  Faces Pain Scale: Hurts little more Pain Location: abdomen Pain Descriptors / Indicators: Grimacing;Guarding Pain Intervention(s): Monitored during session;Repositioned    Home Living                      Prior Function            PT Goals (current goals can now be found in the care plan section) Acute Rehab PT Goals Patient Stated Goal: Return to her home Potential to Achieve Goals: Good Progress towards PT goals: Progressing toward goals  Frequency  Min 3X/week    PT Plan Current plan remains appropriate    Co-evaluation             End of Session Equipment Utilized During Treatment: Gait belt Activity Tolerance: Patient limited by fatigue Patient left: in chair;with call bell/phone within reach;with chair alarm set     Time: [id number] PT Time Calculation (min) (ACUTE ONLY): 24 min  Charges:  $Therapeutic Exercise: 8-22 mins $Therapeutic Activity: 8-22 mins                    G Codes:      Cristela Blue 2015-06-16,  2:03 PM  Governor Rooks, PTA pager [phone]

## 2015-06-07 NOTE — Consult Note (Signed)
WOC ostomy follow up Stoma type/location: LLQ, end colostomy Stomal assessment/size: 1" x 1 1/8" oval shaped, flush with skin with dip at o'clock  Peristomal assessment: intact  Treatment options for stomal/peristomal skin: adding 2" barrier ring to aid in seal of the pouch to accommodate creasing and due to flush stoma. Output liquid, dark black, obvious blood in output.  Ostomy pouching: 1pc flat with 2" barrier ring. Education provided: patient is not feeling well, has been nauseate and had emesis last evening.  She keeps her eyes closed during my visit.  Her daughter is at the bedside and is able to recall our teaching session as I demonstrate again the pouch change.  Not sure if her daughter will be able to assist or not, she has some dexterity issues apparent while she is at the bedside with me.  Plans for patient to transfer to CIR at some point, WOC will continue to follow with you and assess patients ability during her inpatient rehab stay. Enrolled patient in [place] Start Discharge program: Yes  WOC will follow along with you for continued support with ostomy teaching and care [facility] RN,CWOCN [phone]

## 2015-06-08 LAB — BASIC METABOLIC PANEL WITH GFR
Anion gap: 14 (ref 5–15)
BUN: 50 mg/dL — ABNORMAL HIGH (ref 6–20)
CO2: 25 mmol/L (ref 22–32)
Calcium: 9 mg/dL (ref 8.9–10.3)
Chloride: 107 mmol/L (ref 101–111)
Creatinine, Ser: 1.54 mg/dL — ABNORMAL HIGH (ref 0.44–1.00)
GFR calc Af Amer: 33 mL/min — ABNORMAL LOW
GFR calc non Af Amer: 29 mL/min — ABNORMAL LOW
Glucose, Bld: 153 mg/dL — ABNORMAL HIGH (ref 65–99)
Potassium: 3.3 mmol/L — ABNORMAL LOW (ref 3.5–5.1)
Sodium: 146 mmol/L — ABNORMAL HIGH (ref 135–145)

## 2015-06-08 LAB — CBC
HCT: 26.4 % — ABNORMAL LOW (ref 36.0–46.0)
Hemoglobin: 8.6 g/dL — ABNORMAL LOW (ref 12.0–15.0)
MCH: 27.1 pg (ref 26.0–34.0)
MCHC: 32.6 g/dL (ref 30.0–36.0)
MCV: 83.3 fL (ref 78.0–100.0)
Platelets: 297 K/uL (ref 150–400)
RBC: 3.17 MIL/uL — ABNORMAL LOW (ref 3.87–5.11)
RDW: 18.1 % — ABNORMAL HIGH (ref 11.5–15.5)
WBC: 9.9 K/uL (ref 4.0–10.5)

## 2015-06-08 LAB — GLUCOSE, CAPILLARY
GLUCOSE-CAPILLARY: 111 mg/dL — AB (ref 65–99)
GLUCOSE-CAPILLARY: 118 mg/dL — AB (ref 65–99)
GLUCOSE-CAPILLARY: 121 mg/dL — AB (ref 65–99)
GLUCOSE-CAPILLARY: 125 mg/dL — AB (ref 65–99)
Glucose-Capillary: 122 mg/dL — ABNORMAL HIGH (ref 65–99)
Glucose-Capillary: 132 mg/dL — ABNORMAL HIGH (ref 65–99)
Glucose-Capillary: 141 mg/dL — ABNORMAL HIGH (ref 65–99)

## 2015-06-08 LAB — PROTIME-INR
INR: 1.43 (ref 0.00–1.49)
Prothrombin Time: 17.5 s — ABNORMAL HIGH (ref 11.6–15.2)

## 2015-06-08 NOTE — Progress Notes (Signed)
Son reports pt has poor appetite.  Tried orange resource, then it made her nauseated.  Zofran given.

## 2015-06-08 NOTE — Progress Notes (Signed)
Family Medicine Teaching Service Daily Progress Note Intern Pager: [phone]  Patient name: [name] Medical record number: [id number] Date of birth: 1923-07-09 Age: 82 y.o. Gender: female  Primary Care Provider: [name], MD Consultants: ID, surgery  Code Status: Full   Pt Overview and Major Events to Date:  4/28: Readmit for Diverticulitis with possible perforation / abscess 5/5: Hartmann's procedure  Assessment and Plan: [name] is a 82 y.o. female presenting with LLQ abdominal pain. PMH is significant for atrial fibrillation, hx of CVA, hypertension, type 2 diabetes, tachycardia-bradycardia syndrome s/p pacemaker, prior pericardial effusion s/p pericardial window 12/16, sleep apnea (no CPAP), stroke (right frontal 2013), GI bleeding with hemorrhoids, and arthritis and recurrent diverticulitis.  # S/p Hartmann procedure on 5/5  following recurrent diverticulitis possibly perforation / abscess: Abdominal X ray now showing patient with ileus. Also bleeding from ostomy per surgery. Also concerns for SBO.  - Antibiotics regiment per ID  2 weeks of zosyn post surgery (end 5-19) Vancomycin 125 mg PO q6hrs (end 5/19)  Then, at the end of 2 weeks prolonged taper of po vanco 125mg  bid for 1 week 125mg  qdy for 1 week 125mg  qoday for 2 weeks.  - Stop opioids due to ileus  - Surgery recs  - Clears for now  - tagged RBC scan if continues to bleed  - Continue to hold coumadin   # LE edema. On Demadex 80 mg daily at home. Lower extremity edema improved. Watch kidney function  - No home torsemide for now as patient with decreased PO intake and bump in SCr  - Continue BMETs, Scr 0.98> 1.14>1.22 - Strict I/Os - Await results BMET today  # C.difficule positive: Ostomy in place.  Having stool output. - PO vancomycin 150 mg QID (5/1 > 5/5),  IV flagyl 5/6- 5/9,  - PO vancomycin 150 mg QID (5/10>>), regiment as above  - Contact precautions   # Chronic Atrial  fibrillation: Ventricular Pacemaker for AF. INR 1.21 currently  - Continue Verapamil and Digoxin  - Hold Warfarin for now until bleeding stops - SCDs  # DM2: Diet controlled. A1c 6.2 (12/2014)> 6.3. Has required 13 units of Novolog over last 24 hours.  - monitor cbgs; SSI sensitive    # Anemia: Bleeding from ostomy. Hgb 10.4 on admit (baseline 11). History of diverticulitis and hemorrhoids - Slight drop in hgb from 9.8 >>>9.0 >8.6>8.6, relatively stable  - AM CBCs  #CKD Stage 3: Baseline Cr 1-1.2  -stable   # Hypokalemia: K of 3.3 this AM - Will get daily 40 meq KDUR today  FEN/GI: Regular; SLIV,  Prophylaxis: SCDs  Disposition: Med-surg  Subjective:  Patient does not have a good appetite this morning. She is still feeling weak. Last night she was nauseated and had emesis upon light palpation of abdomen.   Objective: Temp:  [98 F (36.7 C)-98.4 F (36.9 C)] 98.2 F (36.8 C) (05/13 0456) Pulse Rate:  [79-98] 98 (05/13 0456) Resp:  [17-18] 17 (05/13 0456) BP: (106-119)/(50-78) 118/54 mmHg (05/13 0456) SpO2:  [94 %-97 %] 94 % (05/13 0456) Weight:  [158 lb (71.668 kg)] 158 lb (71.668 kg) (05/13 0456) Physical Exam: General: NAD, lying in bed Cardiovascular: regular rate and rhythm with 2/6 murmur, paced rhythm  Respiratory: CTAB, no increased WOB  Abdomen: Soft, incision site C/D/I with staples in place, colostomy bag in place with dark liquid, BS+ (tinkering sounds) Ext: 2+ LE edema bilaterally  Laboratory:  Recent Labs Lab 06/06/15 0344 06/07/15  0440 06/07/15 1630 06/08/15 1120  WBC 10.4 9.2  --  9.9  HGB 9.0* 8.6* 8.5* 8.6*  HCT 27.6* 27.8* 27.8* 26.4*  PLT 254 269  --  297    Recent Labs Lab 06/03/15 0513  06/06/15 0344 06/07/15 0440 06/08/15 1120  NA 143  < > 146* 145 146*  K 3.8  < > 3.2* 3.6 3.3*  CL 111  < > 109 107 107  CO2 22  < > 27 25 25   BUN 47*  < > 37* 41* 50*  CREATININE 1.27*  < > 1.14* 1.22* 1.54*  CALCIUM 8.7*  < > 8.7* 8.9 9.0   PROT 5.2*  --  4.9*  --   --   BILITOT 0.4  --  0.6  --   --   ALKPHOS 73  --  71  --   --   ALT 13*  --  11*  --   --   AST 15  --  22  --   --   GLUCOSE 152*  < > 144* 113* 153*  < > = values in this interval not displayed.  Imaging/Diagnostic Tests: Dg Chest Port 1 View  06/06/2015  CLINICAL DATA:  Pneumonia. EXAM: PORTABLE CHEST 1 VIEW COMPARISON:  05/28/2015 FINDINGS: Left arm PICC line and transvenous pacemaker remain in stable position. Marked cardiac enlargement remains stable. Persistent mild atelectasis or infiltrate seen in the medial right lung base. No definite pleural effusion or pneumothorax visualized on this portable exam. IMPRESSION: Stable cardiomegaly. Persistent mild atelectasis or infiltrate in medial right lung base. Electronically Signed   By: Earle Gell M.D.   On: 06/06/2015 23:42   Dg Abd 2 Views  06/07/2015  CLINICAL DATA:  Emesis. EXAM: ABDOMEN - 2 VIEW COMPARISON:  05/30/2015 FINDINGS: Gaseous distension of the bowel loops are identified. This predominantly involves the small bowel loops which measure up to 4.6 cm. Numerous air-fluid levels are identified on the left lateral decubitus radiograph. IMPRESSION: 1. Small bowel dilatation with air-fluid levels. Findings may reflect small bowel obstruction versus ileus. Electronically Signed   By: Kerby Moors M.D.   On: 06/07/2015 11:45    [name] Dolly, MD 06/08/2015, 8:56 AM PGY-1, [place] Intern pager: [phone], text pages welcome

## 2015-06-08 NOTE — Progress Notes (Signed)
8 Days Post-Op  Subjective: Alert.  A little bit of nausea but just some reflux no high-volume emesis.  Denies pain. Off all anticoagulation at present. Still having port wine output from ostomy CBC not done yet this morning.  I ordered stat. Hemodynamically stable with BP 118/54 and heart rate 98.  SPO2 94% on room air. Excellent urine output.  Objective: Vital signs in last 24 hours: Temp:  [98 F (36.7 C)-98.4 F (36.9 C)] 98.2 F (36.8 C) (05/13 0456) Pulse Rate:  [79-98] 98 (05/13 0456) Resp:  [17-18] 17 (05/13 0456) BP: (106-119)/(50-78) 118/54 mmHg (05/13 0456) SpO2:  [94 %-97 %] 94 % (05/13 0456) Weight:  [71.668 kg (158 lb)] 71.668 kg (158 lb) (05/13 0456) Last BM Date: Jun 14, 2015  Intake/Output from previous day: 14-Jun-2022 0701 - 05/13 0700 In: 560 [P.O.:560] Out: 1375 [Urine:1375] Intake/Output this shift: Total I/O In: -  Out: 300 [Urine:300]  General appearance: Alert.  Elderly.  Does not appear to be any distress.  Answers questions appropriately. Resp: clear to auscultation bilaterally GI: Soft.  Nondistended.  Dark port wine colored ostomy output.  Stoma viable.  Midline wound clean staples intact.  Drain site right lower quadrant clean.  Drain is out.  Lab Results:  Results for orders placed or performed during the hospital encounter of 05/25/15 (from the past 24 hour(s))  Glucose, capillary     Status: Abnormal   Collection Time: 06-14-15 12:28 PM  Result Value Ref Range   Glucose-Capillary 153 (H) 65 - 99 mg/dL  Glucose, capillary     Status: Abnormal   Collection Time: 14-Jun-2015  4:03 PM  Result Value Ref Range   Glucose-Capillary 148 (H) 65 - 99 mg/dL  Hemoglobin and hematocrit, blood     Status: Abnormal   Collection Time: 14-Jun-2015  4:30 PM  Result Value Ref Range   Hemoglobin 8.5 (L) 12.0 - 15.0 g/dL   HCT 27.8 (L) 36.0 - 46.0 %  Glucose, capillary     Status: Abnormal   Collection Time: 06-14-15  6:04 PM  Result Value Ref Range   Glucose-Capillary  143 (H) 65 - 99 mg/dL  Glucose, capillary     Status: Abnormal   Collection Time: 06/14/15  7:59 PM  Result Value Ref Range   Glucose-Capillary 136 (H) 65 - 99 mg/dL  Glucose, capillary     Status: Abnormal   Collection Time: 06/08/15 12:02 AM  Result Value Ref Range   Glucose-Capillary 141 (H) 65 - 99 mg/dL  Glucose, capillary     Status: Abnormal   Collection Time: 06/08/15  4:54 AM  Result Value Ref Range   Glucose-Capillary 118 (H) 65 - 99 mg/dL  Glucose, capillary     Status: Abnormal   Collection Time: 06/08/15  7:59 AM  Result Value Ref Range   Glucose-Capillary 132 (H) 65 - 99 mg/dL     Studies/Results: Dg Abd 2 Views  14-Jun-2015  CLINICAL DATA:  Emesis. EXAM: ABDOMEN - 2 VIEW COMPARISON:  05/30/2015 FINDINGS: Gaseous distension of the bowel loops are identified. This predominantly involves the small bowel loops which measure up to 4.6 cm. Numerous air-fluid levels are identified on the left lateral decubitus radiograph. IMPRESSION: 1. Small bowel dilatation with air-fluid levels. Findings may reflect small bowel obstruction versus ileus. Electronically Signed   By: Kerby Moors M.D.   On: 14-Jun-2015 11:45    . antiseptic oral rinse  7 mL Mouth Rinse q12n4p  . chlorhexidine  15 mL Mouth Rinse BID  .  feeding supplement  1 Container Oral TID BM  . insulin aspart  0-15 Units Subcutaneous Q4H  . lip balm   Topical BID  . pantoprazole  40 mg Oral Daily  . piperacillin-tazobactam (ZOSYN)  IV  3.375 g Intravenous Q8H  . potassium chloride  40 mEq Oral Daily  . saccharomyces boulardii  250 mg Oral BID  . sodium chloride flush  10-40 mL Intracatheter Q12H  . vancomycin  125 mg Oral Q6H  . verapamil  120 mg Oral Daily     Assessment/Plan: s/p Procedure(s): SIGMOID COLECTOMY DESCENDING COLOSTOMY SMALL BOWEL RESECTION DRAINAGE OF PELVIC ABSCESS POD #8.  Laparotomy, sigmoid colectomy with descending colostomy, small bowel resection, placement of pelvic abscess drain. Still  has partial ileus.  Will leave on clear liquids Off all narcotics I have ordered CBC and bmet stat.  Postop GI bleeding.  Presumption would be Coumadin related bleeding from small bowel staple line.  Hopefully will be self-limited. If persists would consider tagged RBC scan to at least differentiate large bowel from small bowel prior to colonoscopy.  C. difficile positive.  Being treated with oral vancomycin.  Chronic atrial fibrillation with ventricular pacemaker in place. Coumadin on hold due to GI bleed DM 2.  Controlled CK D stage III.  Appreciate management of medical problems by family practice teaching service.  Discussed with Dr. Andria Frames.   @PROBHOSP @  LOS: 14 days    Sakina Briones M 06/08/2015  . .prob

## 2015-06-09 LAB — CBC
HEMATOCRIT: 25.4 % — AB (ref 36.0–46.0)
Hemoglobin: 8.1 g/dL — ABNORMAL LOW (ref 12.0–15.0)
MCH: 26.6 pg (ref 26.0–34.0)
MCHC: 31.9 g/dL (ref 30.0–36.0)
MCV: 83.6 fL (ref 78.0–100.0)
PLATELETS: 309 10*3/uL (ref 150–400)
RBC: 3.04 MIL/uL — AB (ref 3.87–5.11)
RDW: 18.4 % — ABNORMAL HIGH (ref 11.5–15.5)
WBC: 10.6 10*3/uL — AB (ref 4.0–10.5)

## 2015-06-09 LAB — BASIC METABOLIC PANEL
ANION GAP: 11 (ref 5–15)
BUN: 47 mg/dL — AB (ref 6–20)
CO2: 27 mmol/L (ref 22–32)
Calcium: 8.7 mg/dL — ABNORMAL LOW (ref 8.9–10.3)
Chloride: 106 mmol/L (ref 101–111)
Creatinine, Ser: 1.5 mg/dL — ABNORMAL HIGH (ref 0.44–1.00)
GFR calc Af Amer: 34 mL/min — ABNORMAL LOW (ref >60)
GFR calc non Af Amer: 29 mL/min — ABNORMAL LOW (ref >60)
GLUCOSE: 124 mg/dL — AB (ref 65–99)
POTASSIUM: 3.3 mmol/L — AB (ref 3.5–5.1)
Sodium: 144 mmol/L (ref 135–145)

## 2015-06-09 LAB — GLUCOSE, CAPILLARY
GLUCOSE-CAPILLARY: 133 mg/dL — AB (ref 65–99)
GLUCOSE-CAPILLARY: 154 mg/dL — AB (ref 65–99)
Glucose-Capillary: 107 mg/dL — ABNORMAL HIGH (ref 65–99)
Glucose-Capillary: 119 mg/dL — ABNORMAL HIGH (ref 65–99)
Glucose-Capillary: 167 mg/dL — ABNORMAL HIGH (ref 65–99)
Glucose-Capillary: 143 mg/dL — ABNORMAL HIGH (ref 65–99)

## 2015-06-09 LAB — PROTIME-INR
INR: 1.67 — AB (ref 0.00–1.49)
PROTHROMBIN TIME: 19.7 s — AB (ref 11.6–15.2)

## 2015-06-09 MED ORDER — VITAMIN K1 10 MG/ML IJ SOLN
5.0000 mg | Freq: Once | INTRAMUSCULAR | Status: AC
  Administered 2015-06-09: 5 mg via INTRAVENOUS
  Filled 2015-06-09: qty 0.5

## 2015-06-09 MED ORDER — SODIUM CHLORIDE 0.9 % IV BOLUS (SEPSIS)
500.0000 mL | Freq: Once | INTRAVENOUS | Status: AC
  Administered 2015-06-09: 500 mL via INTRAVENOUS

## 2015-06-09 MED ORDER — ZOLPIDEM TARTRATE 5 MG PO TABS
5.0000 mg | ORAL_TABLET | Freq: Every evening | ORAL | Status: DC | PRN
  Administered 2015-06-09 – 2015-06-13 (×5): 5 mg via ORAL
  Filled 2015-06-09 (×5): qty 1

## 2015-06-09 MED ORDER — POTASSIUM CHLORIDE CRYS ER 20 MEQ PO TBCR
40.0000 meq | EXTENDED_RELEASE_TABLET | Freq: Once | ORAL | Status: AC
  Administered 2015-06-09: 40 meq via ORAL
  Filled 2015-06-09: qty 2

## 2015-06-09 NOTE — Progress Notes (Signed)
9 Days Post-Op  Subjective: Stable and alert.  Complains of mild malaise but no vomiting.  No pain. Hemodynamically stable with excellent urine output. Deconditioned, as expected following major operative procedure at advanced age. Treatment plan discussed with patient and family in detail  She still has some melanotic stool in bag.  Lots of air in bag. On protonix Hemoglobin 8.1.  Was 8.6 yesterday.  WBC 10,006.  INR slightly higher at 1.67.  Objective: Vital signs in last 24 hours: Temp:  [98.2 F (36.8 C)-98.5 F (36.9 C)] 98.2 F (36.8 C) (05/14 0411) Pulse Rate:  [79-87] 84 (05/14 0411) Resp:  [18-20] 19 (05/14 0411) BP: (110-118)/(63-88) 114/70 mmHg (05/14 0411) SpO2:  [91 %-96 %] 94 % (05/14 0411) Weight:  [76.2 kg (167 lb 15.9 oz)] 76.2 kg (167 lb 15.9 oz) (05/14 0411) Last BM Date: 06/08/15  Intake/Output from previous day: 05/13 0701 - 05/14 0700 In: 10 [I.V.:10] Out: 1550 [Urine:600; Stool:950] Intake/Output this shift:    General appearance: Alert. Elderly. Does not appear to be any distress. Answers questions appropriately. Resp: clear to auscultation bilaterally GI: Soft. Nondistended. Dark ostomy output and lots of air in bag.. Stoma viable. Midline wound clean staples intact. Drain site right lower quadrant clean. Drain is out.   Lab Results:  Results for orders placed or performed during the hospital encounter of 05/25/15 (from the past 24 hour(s))  CBC     Status: Abnormal   Collection Time: 06/08/15 11:20 AM  Result Value Ref Range   WBC 9.9 4.0 - 10.5 K/uL   RBC 3.17 (L) 3.87 - 5.11 MIL/uL   Hemoglobin 8.6 (L) 12.0 - 15.0 g/dL   HCT 26.4 (L) 36.0 - 46.0 %   MCV 83.3 78.0 - 100.0 fL   MCH 27.1 26.0 - 34.0 pg   MCHC 32.6 30.0 - 36.0 g/dL   RDW 18.1 (H) 11.5 - 15.5 %   Platelets 297 150 - 400 K/uL  Basic metabolic panel     Status: Abnormal   Collection Time: 06/08/15 11:20 AM  Result Value Ref Range   Sodium 146 (H) 135 - 145 mmol/L   Potassium 3.3 (L) 3.5 - 5.1 mmol/L   Chloride 107 101 - 111 mmol/L   CO2 25 22 - 32 mmol/L   Glucose, Bld 153 (H) 65 - 99 mg/dL   BUN 50 (H) 6 - 20 mg/dL   Creatinine, Ser 1.54 (H) 0.44 - 1.00 mg/dL   Calcium 9.0 8.9 - 10.3 mg/dL   GFR calc non Af Amer 29 (L) >60 mL/min   GFR calc Af Amer 33 (L) >60 mL/min   Anion gap 14 5 - 15  Protime-INR     Status: Abnormal   Collection Time: 06/08/15 11:20 AM  Result Value Ref Range   Prothrombin Time 17.5 (H) 11.6 - 15.2 seconds   INR 1.43 0.00 - 1.49  Glucose, capillary     Status: Abnormal   Collection Time: 06/08/15 12:46 PM  Result Value Ref Range   Glucose-Capillary 122 (H) 65 - 99 mg/dL  Glucose, capillary     Status: Abnormal   Collection Time: 06/08/15  4:59 PM  Result Value Ref Range   Glucose-Capillary 111 (H) 65 - 99 mg/dL  Glucose, capillary     Status: Abnormal   Collection Time: 06/08/15  8:10 PM  Result Value Ref Range   Glucose-Capillary 121 (H) 65 - 99 mg/dL  Glucose, capillary     Status: Abnormal   Collection Time: 06/08/15  11:47 PM  Result Value Ref Range   Glucose-Capillary 125 (H) 65 - 99 mg/dL   Comment 1 Notify RN   Glucose, capillary     Status: Abnormal   Collection Time: 06/09/15  4:08 AM  Result Value Ref Range   Glucose-Capillary 107 (H) 65 - 99 mg/dL  CBC     Status: Abnormal   Collection Time: 06/09/15  4:34 AM  Result Value Ref Range   WBC 10.6 (H) 4.0 - 10.5 K/uL   RBC 3.04 (L) 3.87 - 5.11 MIL/uL   Hemoglobin 8.1 (L) 12.0 - 15.0 g/dL   HCT 25.4 (L) 36.0 - 46.0 %   MCV 83.6 78.0 - 100.0 fL   MCH 26.6 26.0 - 34.0 pg   MCHC 31.9 30.0 - 36.0 g/dL   RDW 18.4 (H) 11.5 - 15.5 %   Platelets 309 150 - 400 K/uL  Basic metabolic panel     Status: Abnormal   Collection Time: 06/09/15  4:34 AM  Result Value Ref Range   Sodium 144 135 - 145 mmol/L   Potassium 3.3 (L) 3.5 - 5.1 mmol/L   Chloride 106 101 - 111 mmol/L   CO2 27 22 - 32 mmol/L   Glucose, Bld 124 (H) 65 - 99 mg/dL   BUN 47 (H) 6 - 20 mg/dL    Creatinine, Ser 1.50 (H) 0.44 - 1.00 mg/dL   Calcium 8.7 (L) 8.9 - 10.3 mg/dL   GFR calc non Af Amer 29 (L) >60 mL/min   GFR calc Af Amer 34 (L) >60 mL/min   Anion gap 11 5 - 15  Protime-INR     Status: Abnormal   Collection Time: 06/09/15  4:34 AM  Result Value Ref Range   Prothrombin Time 19.7 (H) 11.6 - 15.2 seconds   INR 1.67 (H) 0.00 - 1.49  Glucose, capillary     Status: Abnormal   Collection Time: 06/09/15  7:31 AM  Result Value Ref Range   Glucose-Capillary 119 (H) 65 - 99 mg/dL     Studies/Results: No results found.  Marland Kitchen antiseptic oral rinse  7 mL Mouth Rinse q12n4p  . chlorhexidine  15 mL Mouth Rinse BID  . feeding supplement  1 Container Oral TID BM  . insulin aspart  0-15 Units Subcutaneous Q4H  . lip balm   Topical BID  . pantoprazole  40 mg Oral Daily  . phytonadione (VITAMIN K) IV  5 mg Intravenous Once  . piperacillin-tazobactam (ZOSYN)  IV  3.375 g Intravenous Q8H  . potassium chloride  40 mEq Oral Once  . saccharomyces boulardii  250 mg Oral BID  . sodium chloride flush  10-40 mL Intracatheter Q12H  . vancomycin  125 mg Oral Q6H  . verapamil  120 mg Oral Daily     Assessment/Plan: s/p Procedure(s): SIGMOID COLECTOMY DESCENDING COLOSTOMY SMALL BOWEL RESECTION DRAINAGE OF PELVIC ABSCESS  POD #9. Laparotomy, sigmoid colectomy with descending colostomy, small bowel resection, placement of pelvic abscess drain. Still has partial ileus. Will leave on clear liquids Off all narcotics I have ordered CBC and bmet stat.  Postop GI bleeding. Presumption would be Coumadin related bleeding from small bowel staple line. Undiagnosed: Source also possible, but less likely clinically.   With INR now elevated again will give another dose of vitamin K.  Hopefully will be self-limited. If persists would consider tagged RBC scan to at least differentiate large bowel from small bowel prior to colonoscopy.   Clinically does not seem to be bleeding rapidly  enough for  this to be a meaningful test today. If bleeding picks up would opt for upper endoscopy and colonoscopy as well.  C. difficile positive. Being treated with oral vancomycin.  Chronic atrial fibrillation with ventricular pacemaker in place. Coumadin on hold due to GI bleed DM 2. Controlled CK D stage III. Eventual CIR.  Appreciate management of medical problems by family practice teaching service.  @PROBHOSP @  LOS: 15 days    Egan Sahlin M 06/09/2015  . .prob

## 2015-06-09 NOTE — Progress Notes (Signed)
Family Medicine Teaching Service Daily Progress Note Intern Pager: [phone]  Patient name: [name] Medical record number: [id number] Date of birth: Dec 15, 1923 Age: 82 y.o. Gender: female  Primary Care Provider: [name], MD Consultants: ID, surgery  Code Status: Full   Pt Overview and Major Events to Date:  4/28: Readmit for Diverticulitis with possible perforation / abscess 5/5: Hartmann's procedure  Assessment and Plan: [name] is a 82 y.o. female presenting with LLQ abdominal pain. PMH is significant for atrial fibrillation, hx of CVA, hypertension, type 2 diabetes, tachycardia-bradycardia syndrome s/p pacemaker, prior pericardial effusion s/p pericardial window 12/16, sleep apnea (no CPAP), stroke (right frontal 2013), GI bleeding with hemorrhoids, and arthritis and recurrent diverticulitis.  # S/p Hartmann procedure on 5/5  following recurrent diverticulitis possibly perforation / abscess: Abdominal X ray now showing patient with ileus. Also bleeding from ostomy per surgery. Patient denies vomiting or nausea today, has been able to tolerate clear liquids today.  - Tolerated clear liquids today, transition to full liquids  - Antibiotics regiment per ID  2 weeks of zosyn post surgery (end 5-19) Vancomycin 125 mg PO q6hrs (end 5/19)  Then, at the end of 2 weeks prolonged taper of po vanco 125mg  bid for 1 week 125mg  qdy for 1 week 125mg  qoday for 2 weeks.  - Stop opioids due to ileus  - Surgery recs for now   # Anemia: Continues to have melanotic stool Bleeding from ostomy. Hgb 10.4 on admit (baseline 11). History of diverticulitis and hemorrhoids - Slight drop in hgb from 9.8 >>>9.0 >8.6>8.6>8.1 - AM CBCs   #AoCKD Stage 3: Baseline Cr 1-1.2. Slight bump in Creatinine concern that this may be prerenal due to patient's decreases intake  - Continue BMETs, Scr 0.98> 1.14>1.22>1.50 - Will give a 500 ml fluid bolus today  - Strict I/Os, 1.9 Liters   #  C.difficule positive: Ostomy in place.  Having stool output. - PO vancomycin 150 mg QID (5/1 > 5/5),  IV flagyl 5/6- 5/9,  - PO vancomycin 150 mg QID (5/10>>), regiment as above  - Contact precautions   # Chronic Atrial fibrillation: Currently in afib. Ventricular Pacemaker for AF. INR 1.21 currently  - Continue Verapamil and Digoxin  - Hold Warfarin for now until bleeding stops - SCDs  # LE edema. On Demadex 80 mg daily at home. Lower extremity edema improved. No diuretic for now.  -- No home torsemide for now.   # DM2: Diet controlled. A1c 6.2 (12/2014)> 6.3. Has required 13 units of Novolog over last 24 hours.  - monitor cbgs; SSI sensitive     # Hypokalemia: K of 3.3 this AM - Will get daily 40 meq KDUR today  FEN/GI: Regular; SLIV,  Prophylaxis: SCDs  Disposition: Med-surg  Subjective:  Patient does not have a good appetite this morning. She is still feeling weak. Last night she was nauseated and had emesis upon light palpation of abdomen.   Objective: Temp:  [98.2 F (36.8 C)-98.5 F (36.9 C)] 98.2 F (36.8 C) (05/14 0411) Pulse Rate:  [71-87] 71 (05/14 0934) Resp:  [18-20] 19 (05/14 0411) BP: (110-118)/(63-88) 113/78 mmHg (05/14 0934) SpO2:  [91 %-96 %] 95 % (05/14 0934) Weight:  [167 lb 15.9 oz (76.2 kg)] 167 lb 15.9 oz (76.2 kg) (05/14 0411) Physical Exam: General: NAD, lying in bed Cardiovascular: regular rate and rhythm with 2/6 murmur, paced rhythm  Respiratory: CTAB, no increased WOB  Abdomen: Soft, incision site C/D/I with staples in  place, colostomy bag in place with dark liquid, BS+ (tinkering sounds) Ext: 2+ LE edema bilaterally  Laboratory:  Recent Labs Lab 06/07/15 0440 06/07/15 1630 06/08/15 1120 06/09/15 0434  WBC 9.2  --  9.9 10.6*  HGB 8.6* 8.5* 8.6* 8.1*  HCT 27.8* 27.8* 26.4* 25.4*  PLT 269  --  297 309    Recent Labs Lab 06/03/15 0513  06/06/15 0344 06/07/15 0440 06/08/15 1120 06/09/15 0434  NA 143  < > 146* 145 146* 144   K 3.8  < > 3.2* 3.6 3.3* 3.3*  CL 111  < > 109 107 107 106  CO2 22  < > 27 25 25 27   BUN 47*  < > 37* 41* 50* 47*  CREATININE 1.27*  < > 1.14* 1.22* 1.54* 1.50*  CALCIUM 8.7*  < > 8.7* 8.9 9.0 8.7*  PROT 5.2*  --  4.9*  --   --   --   BILITOT 0.4  --  0.6  --   --   --   ALKPHOS 73  --  71  --   --   --   ALT 13*  --  11*  --   --   --   AST 15  --  22  --   --   --   GLUCOSE 152*  < > 144* 113* 153* 124*  < > = values in this interval not displayed.  Imaging/Diagnostic Tests: Dg Abd 2 Views  06/07/2015  CLINICAL DATA:  Emesis. EXAM: ABDOMEN - 2 VIEW COMPARISON:  05/30/2015 FINDINGS: Gaseous distension of the bowel loops are identified. This predominantly involves the small bowel loops which measure up to 4.6 cm. Numerous air-fluid levels are identified on the left lateral decubitus radiograph. IMPRESSION: 1. Small bowel dilatation with air-fluid levels. Findings may reflect small bowel obstruction versus ileus. Electronically Signed   By: Kerby Moors M.D.   On: 06/07/2015 11:45    Klaus Casteneda Cletis Media, MD 06/09/2015, 9:41 AM PGY-1, [place] Intern pager: [phone], text pages welcome

## 2015-06-09 NOTE — Discharge Summary (Signed)
[name] Discharge Summary  Patient name: [name] Medical record number: [id number] Date of birth: [birth year] Age: 82 y.o. Gender: female Date of Admission: 05/25/2015  Date of Discharge: 06/14/2015 Admitting Physician: Alveda Reasons, MD  Primary Care Provider: [name], MD Consultants: ID, Surgery   Indication for Hospitalization: Diverticulitis with abscess and microperforation   Discharge Diagnoses/Problem List:  Patient Active Problem List   Diagnosis Date Noted  . Abdominal pain   . Chronic diastolic congestive heart failure ([place])   . History of CVA with residual deficit   . Respiratory depression   . Stool culture positive for Clostridium difficile   . On total parenteral nutrition (TPN)   . Urinary retention   . Leukocytosis   . Tachycardia   . Acute blood loss anemia   . AKI (acute kidney injury) ([place])   . Status post partial colectomy   . Ileus, postoperative   . Postoperative pain   . Diverticulitis of large intestine with perforation and abscess without bleeding   . CKD (chronic kidney disease) stage 3, GFR 30-59 ml/min 05/26/2015  . HOH (hard of hearing) 05/26/2015  . Protein-calorie malnutrition, moderate ([place]) 05/26/2015  . Diverticulitis of intestine with perforation and abscess 05/25/2015  . Diverticulosis   . Chronic thoracic back pain   . Acute on chronic diastolic heart failure ([place])   . Perforation of intestine due to diverticulitis of gastrointestinal tract ([place])   . SOB (shortness of breath) 05/01/2015  . Supratherapeutic INR   . Paroxysmal atrial fibrillation ([facility])   . Essential hypertension, benign   . Left sided abdominal pain   . Diverticulitis 03/08/2015  . Obesity   . Chronic atrial fibrillation ([place])   . Upper airway cough syndrome   . Dyspnea 05/22/2014  . Tachycardia-bradycardia syndrome with symptomatic bradycardia 01/03/2014  . History of CVA (cerebrovascular accident) 10/25/2013  .  Diabetes ([place]) 08/03/2013  . Myofacial muscle pain 10/16/2011  . Back pain, Left Flank 07/01/2011  . Cerebral embolism with cerebral infarction ([place]) 06/12/2011  . CVA (cerebral infarction) 05/20/2011  . Weakness of left side of body 05/16/2011  . Elevated digoxin level 05/16/2011  . Pacemaker 05/21/2010  . Edema 04/24/2010  . Pericardial effusion, Large   . Chronic anticoagulation - Coumadin, CHADS2VASC=7   . HTN (hypertension)     Disposition: CIR   Discharge Condition: Stable  Discharge Exam: General: NAD, lying in bed Cardiovascular: irregular rate, with 2/6 murmur, paced rhythm  Respiratory: CTAB, no increased WOB  Abdomen: Soft, staples in place, colostomy bag with green/brown stool this AM, BS+, slight ttp in LLQ Ext: Upper extremity with 2+ pitting edema, lower extremitys 2+ pitting edema in legs.   Brief [name] Course:  [name] is 82 y.o. presenting with diverticulitis, with several other previous admission for diverticulitis in the past. On this admission, CT was significant for recurrent diverticulitis with possible perforation and abscess formation. Surgery was consulted for possible colectomy, however patient was found to have C. Difficile and therefore surgery wanted to hold off on until infection was treated. Patient's INR was elevated at the time, and therefore warfarin was stopped.  Surgery recommended starting TPN and continuing antibiotics. ID was consulted for duration and coverage. Per ID, patient was continue on Zosyn, Diflucan, and PO vancomycin prior to surgery. 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 Therefore, on 5/5 surgery performed  a Hartman's procedure as well as dissection and repair of the bladder. Following surgery, over the next several day's patient was slowly reintroduced to po intake and TPN was stopped. ID was  consulted for duration of antibiotics post surgery and indicated continuing zosyn until 5/19 and vancomycin PO with a 4 week taper. Wound was consulted to help train patient and family how to change and care for the ostomy. As patient neared initially discharge, she was found to have ileus with nausea/vomitting and decreased PO intake. Patient was then transitioned back onto a clear liquid diet. At that time, there was some concern for bleeding for ostomy, with a positive FOBT. Patient hemoglobin was trended, and initial remained stable, however had a slow drop over 3 days to a nadir of 7.4. Patient was transfused 2 units, and hgb remained stable over the course of the following 48 hours. Patient without any concern regarding the occult bleeding from the ostomy, and did not recommend any intervention.  Patient diet was transitioned to regular, however patient did not tolerate this due continued ileus. Therefore, slowly advanced diet and patient is now tolerating a regular diet without complaint of n/v in  3 days. Surgery signed off and patient was felt to be ready for discharge to CIR.   Issues for Follow Up:  1. Per Repeat CT on 5/4 patient with Bilateral indeterminate renal lesions for which renal mass protocol indicates IV contrast CT as an outpatient once patient has improved  2.  Patient needs a  taper of vancomycin 125mg  bid for 1 week starting on 06/15/2015 , 125mg  qd for 1 week, 125mg  every other day for 2 weeks 3. Can consider restarting  [place] on 06/15/2015, will need to titrate warfarin to goal INR of 2-3  continue to check INR daily  4. Patient with lower extremity edema, will restart torsemide at 20 mg , lower than home dose 5. Recheck BMET for kidney function and potassium, replete as necessary  6. Will stop digoxin as atrial fibrillation with rate control for now, consider restarting if any issues with rate control.     Significant Procedures: Hartman's Procedure - OP Note on 5/52017 by Dr.  Georgette Dover    Significant Labs and Imaging:   Recent Labs Lab 06/12/15 2020 06/13/15 0625 06/14/15 0504  WBC 16.1* 12.2* 9.4  HGB 10.0* 9.5* 9.5*  HCT 30.5* 29.6* 30.2*  PLT 278 271 330    Recent Labs Lab 06/10/15 0500 06/10/15 1350 06/11/15 0430 06/12/15 0517 06/13/15 0625 06/14/15 0504 06/14/15 0840  NA 141  --  139 138 140 141  --   K 2.9*  --  3.5 3.6 3.2* 2.4*  --   CL 107  --  104 106 105 102  --   CO2 27  --  25 23 26 29   --   GLUCOSE 107*  --  139* 120* 92 95  --   BUN 35*  --  25* 18 13 11   --   CREATININE 1.36*  --  1.18* 1.03* 0.89 1.11*  --   CALCIUM 8.4*  --  8.5* 8.5* 8.4* 8.1*  --   MG 1.8 1.8  --   --   --   --  1.5*  PHOS 2.9  --   --   --   --   --   --   ALKPHOS 100  --  103 105 83 91  --   AST 23  --  24 21 17 19   --  ALT 14  --  16 15 12* 14  --   ALBUMIN 1.6*  --  1.6* 1.7* 1.4* 1.4*  --      Results/Tests Pending at Time of Discharge:None   Discharge Medications:    Medication List    STOP taking these medications        amoxicillin-clavulanate 500-125 MG tablet  Commonly known as:  AUGMENTIN     Digoxin 62.5 MCG Tabs     gabapentin 100 MG capsule  Commonly known as:  NEURONTIN     metolazone 2.5 MG tablet  Commonly known as:  ZAROXOLYN     ondansetron 4 MG disintegrating tablet  Commonly known as:  ZOFRAN ODT     oxyCODONE 5 MG immediate release tablet  Commonly known as:  ROXICODONE     potassium chloride 10 MEQ tablet  Commonly known as:  K-DUR,KLOR-CON      TAKE these medications        feeding supplement Liqd  Take 1 Container by mouth 3 (three) times daily between meals.     pantoprazole 40 MG tablet  Commonly known as:  PROTONIX  Take 1 tablet (40 mg total) by mouth daily.     piperacillin-tazobactam 3.375 GM/50ML IVPB  Commonly known as:  ZOSYN  Inject 50 mLs (3.375 g total) into the vein every 8 (eight) hours.     torsemide 20 MG tablet  Commonly known as:  DEMADEX  Take 1 tablet (20 mg total) by mouth  daily.     vancomycin 50 mg/mL oral solution  Commonly known as:  VANCOCIN  PO vancomycin 125 mg QID until 5/19, then 1 week of vancomycin 125mg  bid, 1 week of  125mg  qd, 2 weeks of 125mg  every other day     verapamil 120 MG CR tablet  Commonly known as:  CALAN-SR  Take 1 tablet (120 mg total) by mouth daily.     warfarin 5 MG tablet  Commonly known as:  COUMADIN  Take 0.5-1 tablets (2.5-5 mg total) by mouth as directed. Takes 0.5 tablet (2.5 mg total) by mouth Monday, Wednesday, Friday, Saturday. Take 1 tablet (5 mg total) by mouth Sunday, Tuesday, Thursday.  Start taking on:  06/15/2015        Discharge Instructions: Please refer to Patient Instructions section of EMR for full details.  Patient was counseled important signs and symptoms that should prompt return to medical care, changes in medications, dietary instructions, activity restrictions, and follow up appointments.   Follow-Up Appointments: Follow-up Information    Follow up with TSUEI,MATTHEW K., MD. Call in 4 weeks.   Specialty:  General Surgery   Why:  For post-operation check - on 07/01/15 @ noon. Arrive at 1130 to check in and fil out paperwork.   Contact information:   [address] [place] [address] [phone]       Tonette Bihari, MD 06/14/2015, 1:48 PM PGY-1, [place]

## 2015-06-10 LAB — PROTIME-INR
INR: 1.23 (ref 0.00–1.49)
Prothrombin Time: 15.6 seconds — ABNORMAL HIGH (ref 11.6–15.2)

## 2015-06-10 LAB — GLUCOSE, CAPILLARY
GLUCOSE-CAPILLARY: 82 mg/dL (ref 65–99)
GLUCOSE-CAPILLARY: 125 mg/dL — AB (ref 65–99)
GLUCOSE-CAPILLARY: 93 mg/dL (ref 65–99)
GLUCOSE-CAPILLARY: 138 mg/dL — AB (ref 65–99)
Glucose-Capillary: 108 mg/dL — ABNORMAL HIGH (ref 65–99)

## 2015-06-10 LAB — COMPREHENSIVE METABOLIC PANEL
ALK PHOS: 100 U/L (ref 38–126)
ALT: 14 U/L (ref 14–54)
AST: 23 U/L (ref 15–41)
Albumin: 1.6 g/dL — ABNORMAL LOW (ref 3.5–5.0)
Anion gap: 7 (ref 5–15)
BUN: 35 mg/dL — AB (ref 6–20)
CALCIUM: 8.4 mg/dL — AB (ref 8.9–10.3)
CO2: 27 mmol/L (ref 22–32)
CREATININE: 1.36 mg/dL — AB (ref 0.44–1.00)
Chloride: 107 mmol/L (ref 101–111)
GFR calc non Af Amer: 33 mL/min — ABNORMAL LOW (ref >60)
GFR, EST AFRICAN AMERICAN: 38 mL/min — AB (ref >60)
Glucose, Bld: 107 mg/dL — ABNORMAL HIGH (ref 65–99)
Potassium: 2.9 mmol/L — ABNORMAL LOW (ref 3.5–5.1)
SODIUM: 141 mmol/L (ref 135–145)
Total Bilirubin: 0.7 mg/dL (ref 0.3–1.2)
Total Protein: 4.6 g/dL — ABNORMAL LOW (ref 6.5–8.1)

## 2015-06-10 LAB — DIFFERENTIAL
Basophils Absolute: 0 10*3/uL (ref 0.0–0.1)
Basophils Relative: 0 %
EOS PCT: 2 %
Eosinophils Absolute: 0.2 10*3/uL (ref 0.0–0.7)
LYMPHS ABS: 1.1 10*3/uL (ref 0.7–4.0)
LYMPHS PCT: 10 %
MONO ABS: 0.8 10*3/uL (ref 0.1–1.0)
MONOS PCT: 8 %
Neutro Abs: 8.8 10*3/uL — ABNORMAL HIGH (ref 1.7–7.7)
Neutrophils Relative %: 80 %

## 2015-06-10 LAB — OCCULT BLOOD X 1 CARD TO LAB, STOOL: Fecal Occult Bld: POSITIVE — AB

## 2015-06-10 LAB — CBC
HEMATOCRIT: 23.8 % — AB (ref 36.0–46.0)
HEMOGLOBIN: 7.6 g/dL — AB (ref 12.0–15.0)
MCH: 26.9 pg (ref 26.0–34.0)
MCHC: 31.9 g/dL (ref 30.0–36.0)
MCV: 84.1 fL (ref 78.0–100.0)
Platelets: 238 10*3/uL (ref 150–400)
RBC: 2.83 MIL/uL — AB (ref 3.87–5.11)
RDW: 18.8 % — ABNORMAL HIGH (ref 11.5–15.5)
WBC: 10.9 10*3/uL — ABNORMAL HIGH (ref 4.0–10.5)

## 2015-06-10 LAB — MAGNESIUM
MAGNESIUM: 1.8 mg/dL (ref 1.7–2.4)
Magnesium: 1.8 mg/dL (ref 1.7–2.4)

## 2015-06-10 LAB — PHOSPHORUS: PHOSPHORUS: 2.9 mg/dL (ref 2.5–4.6)

## 2015-06-10 LAB — HEMOGLOBIN AND HEMATOCRIT, BLOOD
HCT: 25.5 % — ABNORMAL LOW (ref 36.0–46.0)
Hemoglobin: 7.9 g/dL — ABNORMAL LOW (ref 12.0–15.0)

## 2015-06-10 LAB — PREALBUMIN: Prealbumin: 22.4 mg/dL (ref 18–38)

## 2015-06-10 LAB — TRIGLYCERIDES: TRIGLYCERIDES: 147 mg/dL (ref <150)

## 2015-06-10 MED ORDER — POTASSIUM CHLORIDE 10 MEQ/100ML IV SOLN: 10.0000 meq | INTRAVENOUS | Status: DC

## 2015-06-10 MED ORDER — ENSURE ENLIVE PO LIQD
237.0000 mL | Freq: Three times a day (TID) | ORAL | Status: DC
  Administered 2015-06-10 – 2015-06-11 (×2): 237 mL via ORAL

## 2015-06-10 MED ORDER — GUAIFENESIN 200 MG PO TABS
200.0000 mg | ORAL_TABLET | Freq: Four times a day (QID) | ORAL | Status: DC | PRN
  Administered 2015-06-10 – 2015-06-11 (×2): 200 mg via ORAL
  Filled 2015-06-10 (×3): qty 1

## 2015-06-10 MED ORDER — POTASSIUM CHLORIDE 10 MEQ/100ML IV SOLN
10.0000 meq | INTRAVENOUS | Status: AC
  Administered 2015-06-10 (×5): 10 meq via INTRAVENOUS
  Filled 2015-06-10 (×3): qty 100

## 2015-06-10 NOTE — Progress Notes (Signed)
Family Medicine Teaching Service Daily Progress Note Intern Pager: [phone]  Patient name: [name] Medical record number: [id number] Date of birth: Oct 05, 1923 Age: 82 y.o. Gender: female  Primary Care Provider: [name], MD Consultants: ID, surgery  Code Status: Full   Pt Overview and Major Events to Date:  4/28: Readmit for Diverticulitis with possible perforation / abscess 5/5: Hartmann's procedure  Assessment and Plan: [name] is a 82 y.o. female presenting with LLQ abdominal pain. PMH is significant for atrial fibrillation, hx of CVA, hypertension, type 2 diabetes, tachycardia-bradycardia syndrome s/p pacemaker, prior pericardial effusion s/p pericardial window 12/16, sleep apnea (no CPAP), stroke (right frontal 2013), GI bleeding with hemorrhoids, and arthritis and recurrent diverticulitis.  # S/p Hartmann procedure on 5/5  following recurrent diverticulitis possibly perforation / abscess: Abdominal xray was positive for gastric ileus. Patient with n/v and unable to take PO intake, however this has improved  - Doing well on full liquids, transition to soft diet  - Antibiotics regiment per ID  2 weeks of zosyn post surgery (end 5-19) Vancomycin 125 mg PO q6hrs (end 5/19)  Then, at the end of 2 weeks prolonged taper of po vanco 125mg  bid for 1 week 125mg  qdy for 1 week 125mg  qoday for 2 weeks.  - Stop opioids due to ileus   - Surgery recs for now   # Anemia: Continues to have melanotic stool Bleeding from ostomy, melanotic stool x 2 days.  Hgb 10.4 on admit (baseline 11).  - Surgery following, probably will perfomed tagged RBC scan to determine sight of bleeding today, await recs.  - Slight drop in hgb from 9.8 >>>9.0 >>>8.6>8.1> 7.6  - Afternoon H/H - AM CBCs   #AoCKD Stage 3: Baseline Cr 1-1.2. Slight bump in Creatinine concern that this may be prerenal due to patient's decreases intake  - Continue BMETs, Scr 0.98> 1.14>1.22>1.50>1.36  - Strict I/Os,  1.7 Liters   # C.difficule positive: Ostomy in place.  - PO vancomycin 150 mg QID (5/1 > 5/5),  IV flagyl 5/6- 5/9,  - PO vancomycin 150 mg QID (5/10>>), regiment as above  - Contact precautions   # Chronic Atrial fibrillation: Currently in afib. Ventricular Pacemaker for AF. INR 1.21 currently  - Continue Verapamil and Digoxin  - Hold Warfarin for now until bleeding stops - SCDs  # LE edema. On Demadex 80 mg daily at home. Lower extremity edema improved. No diuretic for now.  -- No home torsemide for now.   # DM2: Diet controlled. A1c 6.2 (12/2014)> 6.3. Has required 13 units of Novolog over last 24 hours.  - monitor cbgs; SSI sensitive     # Hypokalemia: K of 2.9  - Replete with IV potassium  - BMET   FEN/GI: Soft diet, SLIV,  Prophylaxis: SCDs  Disposition: Med-surg  Subjective:  Patient without nausea this morning. Tolerated full liquid diet yesterday without any issues. Advance  Diet.   Objective: Temp:  [97.9 F (36.6 C)-98.2 F (36.8 C)] 97.9 F (36.6 C) (05/15 0519) Pulse Rate:  [74] 74 (05/14 1954) Resp:  [17-18] 18 (05/15 0519) BP: (100-116)/(62-76) 100/76 mmHg (05/15 0519) SpO2:  [92 %-95 %] 92 % (05/15 0519) Weight:  [159 lb 6.3 oz (72.3 kg)] 159 lb 6.3 oz (72.3 kg) (05/15 [id number]) Physical Exam: General: NAD, lying in bed Cardiovascular: regular rate and rhythm with 2/6 murmur, paced rhythm  Respiratory: CTAB, no increased WOB  Abdomen: Soft, incision site C/D/I with staples in place, colostomy bag  in place with dark liquid, BS+  Ext: 2+ LE edema bilaterally  Laboratory:  Recent Labs Lab 06/08/15 1120 06/09/15 0434 06/10/15 0500  WBC 9.9 10.6* 10.9*  HGB 8.6* 8.1* 7.6*  HCT 26.4* 25.4* 23.8*  PLT 297 309 238    Recent Labs Lab 06/06/15 0344  06/08/15 1120 06/09/15 0434 06/10/15 0500  NA 146*  < > 146* 144 141  K 3.2*  < > 3.3* 3.3* 2.9*  CL 109  < > 107 106 107  CO2 27  < > 25 27 27   BUN 37*  < > 50* 47* 35*  CREATININE 1.14*  < >  1.54* 1.50* 1.36*  CALCIUM 8.7*  < > 9.0 8.7* 8.4*  PROT 4.9*  --   --   --  4.6*  BILITOT 0.6  --   --   --  0.7  ALKPHOS 71  --   --   --  100  ALT 11*  --   --   --  14  AST 22  --   --   --  23  GLUCOSE 144*  < > 153* 124* 107*  < > = values in this interval not displayed.  Imaging/Diagnostic Tests: No results found.  Phi Avans Cletis Media, MD 06/10/2015, 9:34 AM PGY-1, [place] Intern pager: [phone], text pages welcome

## 2015-06-10 NOTE — Progress Notes (Signed)
Correction

## 2015-06-10 NOTE — Progress Notes (Signed)
Physical Therapy Treatment Patient Details Name: [name] MRN: [id number] DOB: October 28, 1923 Today's Date: 06/10/2015    History of Present Illness 87 F with known a fib/tachy-brady syndrome s/p pacemaker and other multiple comorbidities who has had several recurrent admission for diverticulitis and presents now with the same. States pain has never really abated since last discharge. Also with profound weakness and fatigue; Diverticulitis with possible perforation / abscess; s/p Sigmoid colectomy, descending colostomy, small bowel resection, placement of pelvic abscess drain    PT Comments    Pt performed mobility including gait and transfers.  Pt required min-min guard assist and able to push gait only two feet further than previous tx.  Pt remains limited due to fatigue and pain.    Follow Up Recommendations  CIR;Supervision/Assistance - 24 hour;Supervision for mobility/OOB     Equipment Recommendations  None recommended by PT    Recommendations for Other Services Rehab consult     Precautions / Restrictions Precautions Precautions: Fall Precaution Comments: multiple lines, J-P drain Restrictions Weight Bearing Restrictions: No    Mobility  Bed Mobility Overal bed mobility: Needs Assistance Bed Mobility: Supine to Sit     Supine to sit: Min guard;Min assist     General bed mobility comments: Cues for hand placement and LE advancement to edge of bed.    Transfers Overall transfer level: Needs assistance Equipment used: Rolling walker (2 wheeled) Transfers: Sit to/from Stand Sit to Stand: Min assist Stand pivot transfers: Min assist       General transfer comment: Pt performed sit to stand from bed, BSC x2 trials total.  pt require cues for hand placement, weight shifting forward and hip/knee/trunk extension for erect stance.    Ambulation/Gait Ambulation/Gait assistance: Min assist Ambulation Distance (Feet): 20 Feet (Pt remains to ambulate further.   Reports fatigue.  ) Assistive device: Rolling walker (2 wheeled) Gait Pattern/deviations: Decreased stride length;Step-to pattern;Trunk flexed Gait velocity: slow   General Gait Details: Pt required cues for increasing B stride length and upright stance in RW.     Stairs            Wheelchair Mobility    Modified Rankin (Stroke Patients Only)       Balance Overall balance assessment: Needs assistance   Sitting balance-Leahy Scale: Good       Standing balance-Leahy Scale: Poor                      Cognition Arousal/Alertness: Awake/alert Behavior During Therapy: WFL for tasks assessed/performed Overall Cognitive Status: Within Functional Limits for tasks assessed Area of Impairment: Awareness               General Comments: Pt able to verbalize needing to go to the bathroom.  Urinary incontinence remains transitioning to toilet.      Exercises      General Comments        Pertinent Vitals/Pain Pain Assessment: Faces Faces Pain Scale: Hurts little more Pain Location: abdomen Pain Descriptors / Indicators: Grimacing;Guarding Pain Intervention(s): Monitored during session;Repositioned    Home Living                      Prior Function            PT Goals (current goals can now be found in the care plan section) Acute Rehab PT Goals Patient Stated Goal: Return to her home Potential to Achieve Goals: Good Progress towards PT goals: Progressing toward goals  Frequency  Min 3X/week    PT Plan Current plan remains appropriate    Co-evaluation             End of Session Equipment Utilized During Treatment: Gait belt Activity Tolerance: Patient limited by fatigue Patient left: in chair;with call bell/phone within reach;with chair alarm set     Time: 1133-1201 PT Time Calculation (min) (ACUTE ONLY): 28 min  Charges:  $Gait Training: 8-22 mins $Therapeutic Activity: 8-22 mins                    G Codes:       Cristela Blue June 28, 2015, 1:30 PM  Governor [place]ook[place], PTA pager [phone]

## 2015-06-10 NOTE — Progress Notes (Signed)
10 Days Post-Op  Subjective: She looks pretty good all thing considered.  She is not getting up much per the family.  Ostomy drainage is still very liquid and green.  She is tolerating full liquids.  Her sites look fine.  She says Medicine talked about transfusing her and possible colonoscopy.    Objective: Vital signs in last 24 hours: Temp:  [97.9 F (36.6 C)-98.2 F (36.8 C)] 97.9 F (36.6 C) (05/15 0519) Pulse Rate:  [71-74] 74 (05/14 1954) Resp:  [17-18] 18 (05/15 0519) BP: (100-116)/(62-78) 100/76 mmHg (05/15 0519) SpO2:  [92 %-95 %] 92 % (05/15 0519) Weight:  [72.3 kg (159 lb 6.3 oz)] 72.3 kg (159 lb 6.3 oz) (05/15 0644) Last BM Date: 06/09/15 880 PO 1175 from the ostomy Urine 600 Afebrile, VSS Creatinine is stable, prealbumin looks good at 22.4 WBC 10.9, H/H down slightly over last 24 hours. INR down to 1.23 today     Intake/Output from previous day: 05/14 0701 - 05/15 0700 In: 1669 [P.O.:880; I.V.:189; IV Piggyback:600] Out: [id number] [Urine:600; [id number] Intake/Output this shift:    General appearance: alert, cooperative and no distress Resp: clear to auscultation bilaterally and anterior exam GI: soft, sore +BS, incision looks fine.  Drainage from ostomy looks green and bilious to me she has allot of it today.   Extremities: trace edema.  Lab Results:   Recent Labs  06/09/15 0434 06/10/15 0500  WBC 10.6* 10.9*  HGB 8.1* 7.6*  HCT 25.4* 23.8*  PLT 309 238    BMET  Recent Labs  06/09/15 0434 06/10/15 0500  NA 144 141  K 3.3* 2.9*  CL 106 107  CO2 27 27  GLUCOSE 124* 107*  BUN 47* 35*  CREATININE 1.50* 1.36*  CALCIUM 8.7* 8.4*   PT/INR  Recent Labs  06/09/15 0434 06/10/15 0500  LABPROT 19.7* 15.6*  INR 1.67* 1.23     Recent Labs Lab 06/06/15 0344 06/10/15 0500  AST 22 23  ALT 11* 14  ALKPHOS 71 100  BILITOT 0.6 0.7  PROT 4.9* 4.6*  ALBUMIN 1.7* 1.6*     Lipase     Component Value Date/Time   LIPASE 25 05/25/2015 1639     Studies/Results: No results found.  Medications: . antiseptic oral rinse  7 mL Mouth Rinse q12n4p  . chlorhexidine  15 mL Mouth Rinse BID  . feeding supplement  1 Container Oral TID BM  . insulin aspart  0-15 Units Subcutaneous Q4H  . lip balm   Topical BID  . pantoprazole  40 mg Oral Daily  . piperacillin-tazobactam (ZOSYN)  IV  3.375 g Intravenous Q8H  . potassium chloride  10 mEq Intravenous Q1 Hr x 4  . saccharomyces boulardii  250 mg Oral BID  . sodium chloride flush  10-40 mL Intracatheter Q12H  . vancomycin  125 mg Oral Q6H  . verapamil  120 mg Oral Daily   . lactated ringers 10 mL/hr at 06/09/15 1214   Assessment/Plan Diverticulitis with perforation with enlarging abscess S/p Sigmoid colectomy, descending colostomy, small bowel resection, placement of pelvic abscess drain, 05/31/15, Dr. [name]. Post op ileus Post op GI Bleed, Off Anticoauglation C diff colitis on screen done 05/26/15 Chronic anticoagulation with atrial fibrillation  Tachy-brady syndrome with PTVP AODM Hypertension Hx of CVA Hypokalemia 2.9  Renal insuffiencey Leukocytosis  [id number] liquids ID: day 18 antibiotics; day 9 diflucan completed 06/03/15, PO vancomycin day 10, back on Zosyn 05/29/15, day 12 VTE: anticoagulation on hold secondary to anemia-GI bleed/SCD  Plan:  I have ask nursing to check ostomy bag drainage for stool occult, she needs her K+ replaced and will defer to Medicine for this. She has 4 runs ordered.  She may need more than this to get her K+ in the 4.0 range.  Mag is OK at 1.8.  I would agree to transfusion, with her age and prolonged illness.   I will discuss with Dr. Grandville Silos the question on some type endoscopy, I would be hesitant to put any scope down her at this point.   She has PT ordered, ambulation and OOB orders.        LOS: 16 days    Amar Keenum 06/10/2015 [phone]

## 2015-06-10 NOTE — Progress Notes (Signed)
Calorie Count Note  48 hour calorie count ordered.  Diet: Full liquid Supplements: Boost Breeze po TID, each supplement provides 250 kcal and 9 grams of protein  Calorie count data incomplete. Data estimated with completed meal tickets as well as documented meal percentages. Noted that pt's diet was downgraded to clear liquids from 06/07/15 at lunch through 06/09/15 at breakfast. Intake inadequate at this time partially due to restrictions of clear liquid diet.   Day 1 Breakfast: 40 kcals, 2 grams protein  Lunch: 0% meal completion Dinner: 0% meal completion Supplements: 231 kcals, 13 grams protein (approximately 2/3 of Ensure supplement)  Total intake: 271 kcal (18% of minimum estimated needs)  15 protein (21% of minimum estimated needs)  Day 2 Breakfast: 87 kcals, 2 grams protein Lunch: 183 kcals, 2 grams protein Dinner: 202 kcals, 2 grams protein Supplements: 2 Boost Breeze supplements given (however, completion likely poor secondary to nausea)  Total intake: 972 kcal (65% of minimum estimated needs)  24 protein (34% of minimum estimated needs)  Average Total intake: 622 kcal (41% of minimum estimated needs)  20 protein (29% of minimum estimated needs)  Nutrition Dx: Inadequate oral intake related to altered GI function as evidenced by per patient/family report; ongoing  Goal: Patient will meet greater than or equal to 90% of their needs; progressing  Intervention:   -D/c Boost Breeze po TID, each supplement provides 250 kcal and 9 grams of protein -Ensure Enlive po TID, each supplement provides 350 kcal and 20 grams of protein  Shatha Hooser A. Jimmye Norman, RD, LDN, CDE Pager: [phone] After hours Pager: [phone]

## 2015-06-10 NOTE — Clinical Social Work Note (Signed)
CSW following patient's progress in case she is not able to go to CIR and will need SNF instead.  Jones Broom. Goddard, MSW, [place] 06/10/2015 7:14 PM

## 2015-06-10 NOTE — Progress Notes (Signed)
Rehab admissions - patient was scheduled for admit to inpatient rehab last Thursday, but rehab MD Thursday and Friday did not feel patient was stable for inpatient rehab.  I continue to follow progress.  Call me for questions.  [phone]

## 2015-06-10 NOTE — Progress Notes (Signed)
Offered the Pt. A bath but she stated she would not like to have one today. Also refused linen change.

## 2015-06-11 ENCOUNTER — Inpatient Hospital Stay (HOSPITAL_COMMUNITY): Payer: Medicare Other

## 2015-06-11 LAB — COMPREHENSIVE METABOLIC PANEL
ALT: 16 U/L (ref 14–54)
AST: 24 U/L (ref 15–41)
Albumin: 1.6 g/dL — ABNORMAL LOW (ref 3.5–5.0)
Alkaline Phosphatase: 103 U/L (ref 38–126)
Anion gap: 10 (ref 5–15)
BILIRUBIN TOTAL: 0.5 mg/dL (ref 0.3–1.2)
BUN: 25 mg/dL — AB (ref 6–20)
CHLORIDE: 104 mmol/L (ref 101–111)
CO2: 25 mmol/L (ref 22–32)
CREATININE: 1.18 mg/dL — AB (ref 0.44–1.00)
Calcium: 8.5 mg/dL — ABNORMAL LOW (ref 8.9–10.3)
GFR calc Af Amer: 46 mL/min — ABNORMAL LOW (ref >60)
GFR, EST NON AFRICAN AMERICAN: 39 mL/min — AB (ref >60)
Glucose, Bld: 139 mg/dL — ABNORMAL HIGH (ref 65–99)
Potassium: 3.5 mmol/L (ref 3.5–5.1)
Sodium: 139 mmol/L (ref 135–145)
Total Protein: 4.8 g/dL — ABNORMAL LOW (ref 6.5–8.1)

## 2015-06-11 LAB — CBC
HCT: 28.3 % — ABNORMAL LOW (ref 36.0–46.0)
HEMATOCRIT: 24.3 % — AB (ref 36.0–46.0)
HEMOGLOBIN: 7.4 g/dL — AB (ref 12.0–15.0)
HEMOGLOBIN: 9.2 g/dL — AB (ref 12.0–15.0)
MCH: 25.8 pg — AB (ref 26.0–34.0)
MCH: 27.5 pg (ref 26.0–34.0)
MCHC: 30.5 g/dL (ref 30.0–36.0)
MCHC: 32.5 g/dL (ref 30.0–36.0)
MCV: 84.7 fL (ref 78.0–100.0)
MCV: 84.7 fL (ref 78.0–100.0)
Platelets: 263 10*3/uL (ref 150–400)
Platelets: 272 10*3/uL (ref 150–400)
RBC: 2.87 MIL/uL — ABNORMAL LOW (ref 3.87–5.11)
RBC: 3.34 MIL/uL — ABNORMAL LOW (ref 3.87–5.11)
RDW: 19 % — ABNORMAL HIGH (ref 11.5–15.5)
RDW: 18.2 % — ABNORMAL HIGH (ref 11.5–15.5)
WBC: 9.6 10*3/uL (ref 4.0–10.5)
WBC: 13.9 10*3/uL — ABNORMAL HIGH (ref 4.0–10.5)

## 2015-06-11 LAB — GLUCOSE, CAPILLARY
GLUCOSE-CAPILLARY: 112 mg/dL — AB (ref 65–99)
GLUCOSE-CAPILLARY: 89 mg/dL (ref 65–99)
GLUCOSE-CAPILLARY: 99 mg/dL (ref 65–99)
Glucose-Capillary: 111 mg/dL — ABNORMAL HIGH (ref 65–99)
Glucose-Capillary: 118 mg/dL — ABNORMAL HIGH (ref 65–99)
Glucose-Capillary: 123 mg/dL — ABNORMAL HIGH (ref 65–99)
Glucose-Capillary: 134 mg/dL — ABNORMAL HIGH (ref 65–99)

## 2015-06-11 LAB — PROTIME-INR
INR: 1.18 (ref 0.00–1.49)
Prothrombin Time: 15.1 seconds (ref 11.6–15.2)

## 2015-06-11 LAB — PREPARE RBC (CROSSMATCH)

## 2015-06-11 MED ORDER — MORPHINE SULFATE (PF) 2 MG/ML IV SOLN
1.0000 mg | INTRAVENOUS | Status: DC | PRN
  Administered 2015-06-11: 1 mg via INTRAVENOUS
  Filled 2015-06-11: qty 1

## 2015-06-11 MED ORDER — SODIUM CHLORIDE 0.9 % IV SOLN
Freq: Once | INTRAVENOUS | Status: AC
  Administered 2015-06-11: 15:00:00 via INTRAVENOUS

## 2015-06-11 MED ORDER — MORPHINE SULFATE (PF) 2 MG/ML IV SOLN
1.0000 mg | Freq: Once | INTRAVENOUS | Status: AC
  Administered 2015-06-11: 1 mg via INTRAVENOUS
  Filled 2015-06-11: qty 1

## 2015-06-11 MED ORDER — GUAIFENESIN 200 MG PO TABS
200.0000 mg | ORAL_TABLET | Freq: Two times a day (BID) | ORAL | Status: DC
  Administered 2015-06-11 – 2015-06-14 (×5): 200 mg via ORAL
  Filled 2015-06-11 (×8): qty 1

## 2015-06-11 NOTE — Progress Notes (Signed)
Rehab admissions - I am waiting for attending MD clearance on patient prior to admitting to acute inpatient rehab.  Call me for questions.  [phone]

## 2015-06-11 NOTE — Progress Notes (Signed)
Partial wash for PT. Pt. Had a urine occurrence on the bed changed her gown and bed pad. Offered a full bath but pt. Stated she was in a lot of pain she did not wanted one

## 2015-06-11 NOTE — Progress Notes (Signed)
Family Medicine Teaching Service Daily Progress Note Intern Pager: [phone]  Patient name: [name] Medical record number: [id number] Date of birth: Oct 11, 1923 Age: 82 y.o. Gender: female  Primary Care Provider: [name], MD Consultants: ID, surgery  Code Status: Full   Pt Overview and Major Events to Date:  4/28: Readmit for Diverticulitis with possible perforation / abscess 5/5: Hartmann's procedure  Assessment and Plan: [name] is a 82 y.o. female presenting with LLQ abdominal pain. PMH is significant for atrial fibrillation, hx of CVA, hypertension, type 2 diabetes, tachycardia-bradycardia syndrome s/p pacemaker, prior pericardial effusion s/p pericardial window 12/16, sleep apnea (no CPAP), stroke (right frontal 2013), GI bleeding with hemorrhoids, and arthritis and recurrent diverticulitis.  # S/p Hartmann procedure on 5/5  following recurrent diverticulitis possibly perforation / abscess: Abdominal xray was positive for gastric ileus. Patient with slight nausea yesterday, received Zofran.  - Do to nausea yesterday will continue soft diet, transition to Regular diet tomorrow  - Antibiotics regiment per ID  2 weeks of zosyn post surgery (end 5-19) Vancomycin 125 mg PO q6hrs (end 5/19)  Then, at the end of 2 weeks prolonged taper of po vanco 125mg  bid for 1 week 125mg  qdy for 1 week 125mg  qoday for 2 weeks.  - Stop opioids due to ileus   - Surgery recs for now   # Anemia: Ostomy with yellow/red stool this AM. Had some red jello yesterday.  Hgb 10.4 on admit (baseline 11).  - Surgery following not concerned about the bleeding, no further intervention likely ready for discharge tomorrow per discussion with surgery  - Slight drop in hgb from 9.8 >>>9.0 >>>8.6>8.1> 7.6>7.9>7.4  - Transfuse 2 units of blood  - H&H in the afternoon following transfusion    #Resolved AoCKD Stage 3: Baseline Cr 1-1.2. Slight bump in Creatinine concern that this may be prerenal  due to patient's decreases intake  - Continue BMETs, Scr 0.98> 1.14>1.22>1.50>1.36> 1.18 - Strict I/Os, 1.25 Liters   # C.difficule positive: Ostomy in place.  - PO vancomycin 150 mg QID (5/1 > 5/5),  IV flagyl 5/6- 5/9,  - PO vancomycin 150 mg QID (5/10>>), regiment as above  - Contact precautions   # Chronic Atrial fibrillation: Currently in afib. Ventricular Pacemaker for AF. INR 1.18 currently. CHADVASC 7,  Stroke risk was 11.2% per year in >90,000 patients ([place] Atrial Fibrillation Cohort Study) and 15.7% risk of stroke/TIA/systemic embolism. - Continue Verapamil  - Holding Digoxin  - Hold Warfarin for now until bleeding stops, consider restarting once stable  - SCDs  # LE edema. On Demadex 80 mg daily at home. Lower extremity edema improved. No diuretic for now.  -- No home torsemide for now.   # DM2: Diet controlled. A1c 6.2 (12/2014)> 6.3. Has required 13 units of Novolog over last 24 hours.  - monitor cbgs; SSI sensitive    # Hypokalemia: K of 2.9> 3.5  Magnesium 1.8  - BMET   FEN/GI: Soft diet, SLIV,  Prophylaxis: SCDs  Disposition: Med-surg  Subjective:  Patient with slight nausea yesterday. Received Zofran. Was able to eat. Still not have much appetite.   Objective: Temp:  [97.7 F (36.5 C)-98.1 F (36.7 C)] 97.7 F (36.5 C) (05/16 0559) Pulse Rate:  [85-115] 115 (05/16 0559) Resp:  [17-18] 17 (05/16 0559) BP: (99-112)/(63-78) 112/78 mmHg (05/16 0559) SpO2:  [93 %-97 %] 93 % (05/16 0559) Weight:  [164 lb 3.9 oz (74.5 kg)] 164 lb 3.9 oz (74.5 kg) (05/16  0427) Physical Exam: General: NAD, lying in bed Cardiovascular: irregular rate, with 2/6 murmur, paced rhythm  Respiratory: CTAB, no increased WOB  Abdomen: Soft, incision site C/D/I with staples in place, colostomy bag with yellow/orange liquid stool this AM, BS+  Ext: 2+ LE edema bilaterally  Laboratory:  Recent Labs Lab 06/09/15 0434 06/10/15 0500 06/10/15 1350 06/11/15 0430  WBC 10.6*  10.9*  --  9.6  HGB 8.1* 7.6* 7.9* 7.4*  HCT 25.4* 23.8* 25.5* 24.3*  PLT 309 238  --  263    Recent Labs Lab 06/06/15 0344  06/09/15 0434 06/10/15 0500 06/11/15 0430  NA 146*  < > 144 141 139  K 3.2*  < > 3.3* 2.9* 3.5  CL 109  < > 106 107 104  CO2 27  < > 27 27 25   BUN 37*  < > 47* 35* 25*  CREATININE 1.14*  < > 1.50* 1.36* 1.18*  CALCIUM 8.7*  < > 8.7* 8.4* 8.5*  PROT 4.9*  --   --  4.6* 4.8*  BILITOT 0.6  --   --  0.7 0.5  ALKPHOS 71  --   --  100 103  ALT 11*  --   --  14 16  AST 22  --   --  23 24  GLUCOSE 144*  < > 124* 107* 139*  < > = values in this interval not displayed.  Imaging/Diagnostic Tests: No results found.  [name], MD 06/11/2015, 9:10 AM PGY-1, [place] Intern pager: [phone], text pages welcome

## 2015-06-11 NOTE — Care Management Note (Signed)
Case Management Note  Patient Details  Name: [name] MRN: [id number] Date of Birth: 09-07-1923  Subjective/Objective:                    Action/Plan:   Expected Discharge Date:  05/31/15               Expected Discharge Plan:  [place]  In-House Referral:     Discharge planning Services  CM Consult  Post Acute Care Choice:    Choice offered to:     DME Arranged:    DME Agency:     HH Arranged:    HH Agency:     Status of Service:  In process, will continue to follow  Medicare Important Message Given:    Date Medicare IM Given:    Medicare IM give by:    Date Additional Medicare IM Given:    Additional Medicare Important Message give by:     If discussed at [place] of Stay Meetings, dates discussed:    Additional Comments:  Marilu Favre, RN 06/11/2015, 10:23 AM

## 2015-06-11 NOTE — Consult Note (Signed)
   [facility] CM Inpatient Consult   06/11/2015  [name] 02-15-1923 [id number]     [facility] Care Management follow up. Chart reviewed. Noted discharge plans are for inpatient rehab. Patient has been active with [place] Management in the recent past. However, she declined further [place] Management during a home visit by [facility]. Please see chart review tab the notes for [place] patient outreach encounters.  Marthenia Rolling, MSN-Ed, RN,BSN [facility] Liaison [phone]

## 2015-06-11 NOTE — Progress Notes (Signed)
FPTS Interim Progress Note (late entry note0  S: Patient reports that she continues to have intermittent LLQ abdominal pain.  Pain is described as sharp.  She notes that it has improved slightly.  She reports that Morphine is working well to control symptoms.  It also seems to be lasting long enough.  Continues to pass flatus per ostomy and pass stool per ostomy.  No nausea, vomiting, but is afraid to eat because it might make pain return.  O: BP 111/68 mmHg  Pulse 115  Temp(Src) 98.4 F (36.9 C) (Oral)  Resp 20  Ht 5\' 5"  (1.651 m)  Wt 164 lb 3.9 oz (74.5 kg)  BMI 27.33 kg/m2  SpO2 91%  Gen: awake, alert, elderly female, family at bedside Cardio: tachy Pulm: Normal WOB on room air GI: ostomy in place, green stool in bag but no blood in bag, mild TTP to LLQ below ostomy site, no surrounding erythema or evidence of infection, +BS  Dg Abd Portable 2v  06/11/2015  CLINICAL DATA:  Abdominal pain at the left lower surgical site this afternoon. Status post sigmoid colectomy with descending colon colostomy and drainage of a pelvic abscess. EXAM: PORTABLE ABDOMEN - 2 VIEW COMPARISON:  06/07/2015 FINDINGS: Mild dilation of loops of small bowel is evident, decreased when compared to the prior radiographs. There are air-fluid levels on the decubitus view. Findings are consistent with a diffuse adynamic ileus. No free air. Residual contrast is noted in the rectum. Clips are upper quadrant reflect a prior cholecystectomy, stable. Surgical skin staples lie along the low anterior abdominal wall, also stable. IMPRESSION: 1. Although there is less small bowel dilation on the prior study, there is still mild small bowel dilation with multiple air-fluid levels consistent with an adynamic ileus. No convincing obstruction. No free air. Electronically Signed   By: Lajean Manes M.D.   On: 06/11/2015 16:52   A/P: [name] is a 82 y.o. female s/p Hartmann's procedure on 5/5.  She is currently having abdominal  pain.  Abd xray with mild dilation of small bowel suggesting ileus but no obstruction or free air seen.  She is s/p transfusion 2 units pRBCs.  Post transfusion hemoglobin 9.2. - awaiting lactic acid result - Morphine 1mg  q3 prn while awake ordered.  Discussed monitoring and holding for excessive sedation/ hypotension with RN.  Janora Norlander, DO 06/11/2015, 10:09 PM PGY-2, [place] Medicine Service pager [phone]

## 2015-06-11 NOTE — Progress Notes (Signed)
Offered the Pt. A  Bath and pt. Stated she will see later if she would get her bath

## 2015-06-11 NOTE — Progress Notes (Signed)
Still having some pain, labs not done yet. She does not appear septic.  Getting blood now.  Had to get portable films, they won't let her go down while getting blood.  Film shows some mild dilatation of the small bowel, and some air fluid levels on the decubitus.  Contrast in rectum.  More of an adynamic ileus.  I will put her back on liquids and just see how she does.   Labs still not done.

## 2015-06-11 NOTE — Consult Note (Signed)
WOC ostomy follow up Stoma type/location: LLQ, end colostomy Stomal assessment/size: 1" x 1 1/8" oval shaped, pink, moist Patient just recently developed abdominal pain and surgery team removed her ostomy pouch at that time.  New 1pc placed with 2" barrier ring by bedside nurse.  Daughter and patient able to observe.  Peristomal assessment: NA Treatment options for stomal/peristomal skin: using 2" barrier ring to aid in seal Output none in pouch today Ostomy pouching: 1pc flat pouch with 2" barrier ring Education provided: patient is still having some abdominal pain, using heating pad on abdomen.  Enrolled patient in [place] Start Discharge program: Yes  WOC will follow along with you for continued support with ostomy teaching and care [facility] RN,CWOCN [id number]

## 2015-06-11 NOTE — Progress Notes (Signed)
Pt developed acute pain a short time ago.  She points to the ostomy as the site.  She had stool and gas that was drained before I came.  I removed the ostomy bag and examined the ostomy.  It looks fine.  i digitalized the ostomy also, it is open and also appears to be fine.  She has fluid coming out of the ostomy.  She is tender over the site, but no blood, swelling or erythema.  + BS.  I discussed with Resident and she is ordering KUB, cbc and BMP.  We will follow with you.   BP 107/59 mmHg  Pulse 103  Temp(Src) 98.2 F (36.8 C) (Oral)  Resp 18  Ht 5\' 5"  (1.651 m)  Wt 74.5 kg (164 lb 3.9 oz)  BMI 27.33 kg/m2  SpO2 94%    Will [facility] [id number]  06/11/2015 1:17 PM

## 2015-06-11 NOTE — Progress Notes (Signed)
Rehab admissions - I was notified by attending MD that patient is not ready for inpatient rehab admission today.  Call me for questions.  [phone]

## 2015-06-11 NOTE — Progress Notes (Signed)
11 Days Post-Op  Subjective: She feels a little better, she walked to the door, but wound not go further.  i told her to keep at it.  Not to wait till rehab. She isn't taking allot from her tray and she is on full liquids.  The ostomy is working well her site is healing nicely.  I think we can improve her diet, and have family bring stuff from home also.    Objective: Vital signs in last 24 hours: Temp:  [97.7 F (36.5 C)-98.1 F (36.7 C)] 97.7 F (36.5 C) (05/16 0559) Pulse Rate:  [85-115] 115 (05/16 0559) Resp:  [17-18] 17 (05/16 0559) BP: (99-112)/(63-78) 112/78 mmHg (05/16 0559) SpO2:  [93 %-97 %] 93 % (05/16 0559) Weight:  [74.5 kg (164 lb 3.9 oz)] 74.5 kg (164 lb 3.9 oz) (05/16 0427) Last BM Date: 06/10/15 1510 PO 1450 from ostomy 1250 urine  -314 for the day per I/O  Afebrile, VSS  BP in the 100 range Creatinine is better, H/H stable but still low   Intake/Output from previous day: 05/15 0701 - 05/16 0700 In: 2385.7 [P.O.:1510; I.V.:275.7; IV Piggyback:600] Out: 2700 [Urine:1250; [id number] Intake/Output this shift: Total I/O In: 120 [P.O.:120] Out: 350 [Urine:350]  General appearance: alert, cooperative and no distress GI: soft, + bS, ostomy working well, brown colored fluid in it today, some substance to fluid today.  Incision looks very good.  Lab Results:   Recent Labs  06/10/15 0500 06/10/15 1350 06/11/15 0430  WBC 10.9*  --  9.6  HGB 7.6* 7.9* 7.4*  HCT 23.8* 25.5* 24.3*  PLT 238  --  263    BMET  Recent Labs  06/10/15 0500 06/11/15 0430  NA 141 139  K 2.9* 3.5  CL 107 104  CO2 27 25  GLUCOSE 107* 139*  BUN 35* 25*  CREATININE 1.36* 1.18*  CALCIUM 8.4* 8.5*   PT/INR  Recent Labs  06/10/15 0500 06/11/15 0430  LABPROT 15.6* 15.1  INR 1.23 1.18     Recent Labs Lab 06/06/15 0344 06/10/15 0500 06/11/15 0430  AST 22 23 24   ALT 11* 14 16  ALKPHOS 71 100 103  BILITOT 0.6 0.7 0.5  PROT 4.9* 4.6* 4.8*  ALBUMIN 1.7* 1.6* 1.6*      Lipase     Component Value Date/Time   LIPASE 25 05/25/2015 1639     Studies/Results: No results found.  Medications: . antiseptic oral rinse  7 mL Mouth Rinse q12n4p  . chlorhexidine  15 mL Mouth Rinse BID  . feeding supplement (ENSURE ENLIVE)  237 mL Oral TID BM  . guaiFENesin  200 mg Oral BID  . insulin aspart  0-15 Units Subcutaneous Q4H  . lip balm   Topical BID  . pantoprazole  40 mg Oral Daily  . piperacillin-tazobactam (ZOSYN)  IV  3.375 g Intravenous Q8H  . saccharomyces boulardii  250 mg Oral BID  . sodium chloride flush  10-40 mL Intracatheter Q12H  . vancomycin  125 mg Oral Q6H  . verapamil  120 mg Oral Daily    Assessment/Plan Diverticulitis with perforation with enlarging abscess S/p Sigmoid colectomy, descending colostomy, small bowel resection, placement of pelvic abscess drain, 05/31/15, Dr. [name].  Post op ileus Post op GI Bleed, Off Anticoauglation  Hgb - 06/11/2015 - 7.4  C diff colitis on screen done 05/26/15 Chronic anticoagulation with atrial fibrillation  Tachy-brady syndrome with PTVP AODM Hypertension Hx of CVA Hypokalemia 2.9  Renal insuffiencey Leukocytosis  [id number] liquids ID:  day 19 antibiotics; day 9 diflucan completed 06/03/15, PO vancomycin day 11, back on Zosyn 05/29/15, day 13 VTE: anticoagulation on hold secondary to anemia-GI bleed/SCD    Plan:  Soft diet, I think Medicine is going to transfuse.  Continue to mobilize.  I am not sure why she is still on Zosyn.  She does not need it from a surgical standpoint.    H/H is stable.     LOS: 17 days    JENNINGS,WILLARD 06/11/2015 [id number]  Agree with above. Hard of hearing. Looks good.  Alphonsa Overall, MD, [facility] Pager: [phone] Office phone:  [phone]

## 2015-06-12 ENCOUNTER — Encounter: Payer: Self-pay | Admitting: Physician Assistant

## 2015-06-12 DIAGNOSIS — R1084 Generalized abdominal pain: Secondary | ICD-10-CM

## 2015-06-12 DIAGNOSIS — R109 Unspecified abdominal pain: Secondary | ICD-10-CM | POA: Insufficient documentation

## 2015-06-12 LAB — TYPE AND SCREEN
ABO/RH(D): AB POS
ANTIBODY SCREEN: NEGATIVE
Unit division: 0
Unit division: 0

## 2015-06-12 LAB — GLUCOSE, CAPILLARY
GLUCOSE-CAPILLARY: 105 mg/dL — AB (ref 65–99)
GLUCOSE-CAPILLARY: 113 mg/dL — AB (ref 65–99)
GLUCOSE-CAPILLARY: 117 mg/dL — AB (ref 65–99)
GLUCOSE-CAPILLARY: 125 mg/dL — AB (ref 65–99)
Glucose-Capillary: 140 mg/dL — ABNORMAL HIGH (ref 65–99)
Glucose-Capillary: 143 mg/dL — ABNORMAL HIGH (ref 65–99)

## 2015-06-12 LAB — CBC
HCT: 33.2 % — ABNORMAL LOW (ref 36.0–46.0)
HEMATOCRIT: 30.5 % — AB (ref 36.0–46.0)
HEMOGLOBIN: 10 g/dL — AB (ref 12.0–15.0)
Hemoglobin: 10.4 g/dL — ABNORMAL LOW (ref 12.0–15.0)
MCH: 26.7 pg (ref 26.0–34.0)
MCH: 27.9 pg (ref 26.0–34.0)
MCHC: 31.3 g/dL (ref 30.0–36.0)
MCHC: 32.8 g/dL (ref 30.0–36.0)
MCV: 85.3 fL (ref 78.0–100.0)
MCV: 85.2 fL (ref 78.0–100.0)
PLATELETS: 270 10*3/uL (ref 150–400)
Platelets: 278 10*3/uL (ref 150–400)
RBC: 3.89 MIL/uL (ref 3.87–5.11)
RBC: 3.58 MIL/uL — ABNORMAL LOW (ref 3.87–5.11)
RDW: 17.6 % — AB (ref 11.5–15.5)
RDW: 17.8 % — AB (ref 11.5–15.5)
WBC: 15.9 10*3/uL — AB (ref 4.0–10.5)
WBC: 16.1 10*3/uL — AB (ref 4.0–10.5)

## 2015-06-12 LAB — COMPREHENSIVE METABOLIC PANEL
ALK PHOS: 105 U/L (ref 38–126)
ALT: 15 U/L (ref 14–54)
ANION GAP: 9 (ref 5–15)
AST: 21 U/L (ref 15–41)
Albumin: 1.7 g/dL — ABNORMAL LOW (ref 3.5–5.0)
BUN: 18 mg/dL (ref 6–20)
CHLORIDE: 106 mmol/L (ref 101–111)
CO2: 23 mmol/L (ref 22–32)
Calcium: 8.5 mg/dL — ABNORMAL LOW (ref 8.9–10.3)
Creatinine, Ser: 1.03 mg/dL — ABNORMAL HIGH (ref 0.44–1.00)
GFR, EST AFRICAN AMERICAN: 54 mL/min — AB (ref >60)
GFR, EST NON AFRICAN AMERICAN: 46 mL/min — AB (ref >60)
Glucose, Bld: 120 mg/dL — ABNORMAL HIGH (ref 65–99)
Potassium: 3.6 mmol/L (ref 3.5–5.1)
SODIUM: 138 mmol/L (ref 135–145)
Total Bilirubin: 0.8 mg/dL (ref 0.3–1.2)
Total Protein: 4.8 g/dL — ABNORMAL LOW (ref 6.5–8.1)

## 2015-06-12 LAB — LACTIC ACID, PLASMA: LACTIC ACID, VENOUS: 1.1 mmol/L (ref 0.5–2.0)

## 2015-06-12 LAB — PROTIME-INR
INR: 1.16 (ref 0.00–1.49)
Prothrombin Time: 15 seconds (ref 11.6–15.2)

## 2015-06-12 MED ORDER — BOOST / RESOURCE BREEZE PO LIQD
1.0000 | Freq: Three times a day (TID) | ORAL | Status: DC
  Administered 2015-06-12 – 2015-06-13 (×3): 1 via ORAL

## 2015-06-12 MED ORDER — NYSTATIN 100000 UNIT/ML MT SUSP
5.0000 mL | Freq: Four times a day (QID) | OROMUCOSAL | Status: DC
  Administered 2015-06-12: 500000 [IU] via ORAL
  Filled 2015-06-12: qty 5

## 2015-06-12 MED ORDER — PIPERACILLIN-TAZOBACTAM 3.375 G IVPB
3.3750 g | Freq: Three times a day (TID) | INTRAVENOUS | Status: DC
  Administered 2015-06-12 – 2015-06-14 (×8): 3.375 g via INTRAVENOUS
  Filled 2015-06-12 (×10): qty 50

## 2015-06-12 MED ORDER — PIPERACILLIN-TAZOBACTAM 3.375 G IVPB: 3.3750 g | Freq: Three times a day (TID) | INTRAVENOUS | Status: DC

## 2015-06-12 NOTE — Progress Notes (Signed)
Family Medicine Teaching Service Daily Progress Note Intern Pager: [phone]  Patient name: [name] Medical record number: [id number] Date of birth: 08/03/1923 Age: 82 y.o. Gender: female  Primary Care Provider: [name], MD Consultants: ID, surgery  Code Status: Full   Pt Overview and Major Events to Date:  4/28: Readmit for Diverticulitis with possible perforation / abscess 5/5: Hartmann's procedure  Assessment and Plan: [name] is a 82 y.o. female presenting with LLQ abdominal pain. PMH is significant for atrial fibrillation, hx of CVA, hypertension, type 2 diabetes, tachycardia-bradycardia syndrome s/p pacemaker, prior pericardial effusion s/p pericardial window 12/16, sleep apnea (no CPAP), stroke (right frontal 2013), GI bleeding with hemorrhoids, and arthritis and recurrent diverticulitis.  # S/p Hartmann procedure on 5/5  following recurrent diverticulitis possibly perforation / abscess: Repeat Abdominal Xray with dynamic ileus. Patient put back on clear liquid diet. Lactic acid 1.1.  - repeat x-ray with ileus, back on clear liquid diet yesterday  - Antibiotics regiment per ID  2 weeks of zosyn post surgery (end 5-19) Vancomycin 125 mg PO q6hrs (end 5/19)  Then, at the end of 2 weeks prolonged taper of po vanco 125mg  bid for 1 week 125mg  qdy for 1 week 125mg  qoday for 2 weeks.  - Stop opioids due to ileus   - Surgery recs for now   # Anemia: Ostomy with yellow/red stool this AM.  Hgb 10.4 on admit (baseline 11).  - Surgery following not concerned about the bleeding - Slight drop in hgb from 9.8 >>9.0 >>8.6>> 7.6>7.9>7.4>2 units> 10.4 - WBC 9.6 > 15.9, this likely due to transfusion  - Follow CBC   #Resolved AoCKD Stage 3: Baseline Cr 1-1.2. Slight bump in Creatinine concern that this may be prerenal due to patient's decreases intake  - Continue BMETs, Scr 1.03 - Strict I/Os,800 - Consider Torsemide 80 mg PO ( home dose)   # C.difficule positive:  Ostomy in place.  - PO vancomycin 150 mg QID (5/1 > 5/5),  IV flagyl 5/6- 5/9,  - PO vancomycin 150 mg QID (5/10>>), regiment as above  - Contact precautions   # Chronic Atrial fibrillation: Currently in afib. Ventricular Pacemaker for AF. INR 1.18 currently. CHADVASC 7,  Stroke risk was 11.2% per year in >90,000 patients ([place] Atrial Fibrillation Cohort Study) and 15.7% risk of stroke/TIA/systemic embolism. - Continue Verapamil  - Holding Digoxin  - Hold Warfarin for now until bleeding stops, consider restarting once stable  - SCDs  # Upper Extremity Edema.  On Demadex 80 mg daily at home. Lower extremity edema improved. No diuretic for now.  - Start Torsemide 80 mg PO   # DM2: Diet controlled. A1c 6.2 (12/2014)> 6.3. Has required 13 units of Novolog over last 24 hours.  - monitor cbgs; SSI sensitive    # Hypokalemia: K of 2.9> 3.5>3.6  Magnesium 1.8  - BMET   FEN/GI: Soft diet, SLIV,  Prophylaxis: SCDs  Disposition: Med-surg  Subjective:  Patient with abdominal pain this AM. Yesterday had 10/10 abdominal pain, received morphine and heat pad. Pain improved.   Objective: Temp:  [97.7 F (36.5 C)-98.8 F (37.1 C)] 98.1 F (36.7 C) (05/17 0404) Pulse Rate:  [85-117] 113 (05/17 0404) Resp:  [18-20] 19 (05/17 0404) BP: (98-130)/(46-75) 109/61 mmHg (05/17 0404) SpO2:  [90 %-96 %] 92 % (05/17 0404) Weight:  [166 lb 3.2 oz (75.388 kg)] 166 lb 3.2 oz (75.388 kg) (05/17 0403) Physical Exam: General: NAD, lying in bed Cardiovascular: irregular  rate, with 2/6 murmur, paced rhythm  Respiratory: CTAB, no increased WOB  Abdomen: Soft, incision site C/D/I with staples in place, colostomy bag with yellow/orange liquid stool this AM, BS+  Ext: Upper extremity with 2+ pitting edema, lower extremities elevated no 2+ pitting edema only in feet   Laboratory:  Recent Labs Lab 06/11/15 0430 06/11/15 1955 06/12/15 0517  WBC 9.6 13.9* 15.9*  HGB 7.4* 9.2* 10.4*  HCT 24.3*  28.3* 33.2*  PLT 263 272 270    Recent Labs Lab 06/10/15 0500 06/11/15 0430 06/12/15 0517  NA 141 139 138  K 2.9* 3.5 3.6  CL 107 104 106  CO2 27 25 23   BUN 35* 25* 18  CREATININE 1.36* 1.18* 1.03*  CALCIUM 8.4* 8.5* 8.5*  PROT 4.6* 4.8* 4.8*  BILITOT 0.7 0.5 0.8  ALKPHOS 100 103 105  ALT 14 16 15   AST 23 24 21   GLUCOSE 107* 139* 120*    Imaging/Diagnostic Tests: Dg Abd Portable 2v  06/11/2015  CLINICAL DATA:  Abdominal pain at the left lower surgical site this afternoon. Status post sigmoid colectomy with descending colon colostomy and drainage of a pelvic abscess. EXAM: PORTABLE ABDOMEN - 2 VIEW COMPARISON:  06/07/2015 FINDINGS: Mild dilation of loops of small bowel is evident, decreased when compared to the prior radiographs. There are air-fluid levels on the decubitus view. Findings are consistent with a diffuse adynamic ileus. No free air. Residual contrast is noted in the rectum. Clips are upper quadrant reflect a prior cholecystectomy, stable. Surgical skin staples lie along the low anterior abdominal wall, also stable. IMPRESSION: 1. Although there is less small bowel dilation on the prior study, there is still mild small bowel dilation with multiple air-fluid levels consistent with an adynamic ileus. No convincing obstruction. No free air. Electronically Signed   By: Lajean Manes M.D.   On: 06/11/2015 16:52    [name] Harriott Cletis Media, MD 06/12/2015, 9:47 AM PGY-1, [place] Intern pager: [phone], text pages welcome

## 2015-06-12 NOTE — Progress Notes (Signed)
Physical Therapy Treatment Patient Details Name: [name] MRN: [id number] DOB: [birth year] Today's Date: 06/12/2015    History of Present Illness 41 F with known a fib/tachy-brady syndrome s/p pacemaker and other multiple comorbidities who has had several recurrent admission for diverticulitis and presents now with the same. States pain has never really abated since last discharge. Also with profound weakness and fatigue; Diverticulitis with possible perforation / abscess; s/p Sigmoid colectomy, descending colostomy, small bowel resection, placement of pelvic abscess drain    PT Comments    Pt required cues for encouragement to participate.  Pt noted with productive cough during tx and used yonker during supine therapeutic exercise and post ambulation.  Pt able to improve activity tolerance this tx.  Remains a good candidate for CIR to improve strength and functional mobility before return home.     Follow Up Recommendations  CIR;Supervision/Assistance - 24 hour;Supervision for mobility/OOB     Equipment Recommendations  None recommended by PT    Recommendations for Other Services Rehab consult     Precautions / Restrictions Precautions Precautions: Fall Precaution Comments: multiple lines, J-P drain Restrictions Weight Bearing Restrictions: No    Mobility  Bed Mobility Overal bed mobility: Needs Assistance Bed Mobility: Supine to Sit     Supine to sit: Min assist Sit to supine: Mod assist   General bed mobility comments: Pt required assistance to advance to edge of bed.  Pt required cues for sequencing and hand placement.  Pt required assist for trunk elevation and advancement of B LEs into and out of bed.    Transfers Overall transfer level: Needs assistance Equipment used: Rolling walker (2 wheeled) Transfers: Sit to/from Stand Sit to Stand: Min assist;Mod assist (Require mod assist for stand to sit as pt sit impulsively.  )         General transfer  comment: Pt required cues for hand placement, forward weight shifting and upright posture.  Pt require assist to boost into standing from seated surface.  pt sit impulsively back to bed and required mod assist with multiple bouts of scooting to improve safety sitting edge of bed.  Pt educated on backing completely to seated surface to improve safety and decreased risk for falls in the future.    Ambulation/Gait Ambulation/Gait assistance: Min assist;Mod assist Ambulation Distance (Feet): 22 Feet Assistive device: Rolling walker (2 wheeled) Gait Pattern/deviations: Step-to pattern;Decreased stride length;Trunk flexed;Decreased weight shift to left;Decreased weight shift to right Gait velocity: slow   General Gait Details: Pt required cues for increasing B stride length and upright stance in RW.     Stairs            Wheelchair Mobility    Modified Rankin (Stroke Patients Only)       Balance                                    Cognition Arousal/Alertness: Awake/alert Behavior During Therapy: WFL for tasks assessed/performed Overall Cognitive Status: Within Functional Limits for tasks assessed Area of Impairment: Awareness           Awareness: Anticipatory        Exercises General Exercises - Lower Extremity Ankle Circles/Pumps: AROM;Both;Supine;15 reps Quad Sets: AROM;Both;10 reps;Supine Heel Slides: AROM;Both;10 reps;Supine;AAROM Hip ABduction/ADduction: AROM;AAROM;Both;10 reps;Supine Straight Leg Raises: AROM;Both;10 reps;Supine;AAROM    General Comments        Pertinent Vitals/Pain Pain Assessment: 0-10 Faces Pain Scale: Hurts little  more Pain Descriptors / Indicators: Grimacing;Guarding Pain Intervention(s): Monitored during session;Repositioned    Home Living                      Prior Function            PT Goals (current goals can now be found in the care plan section) Acute Rehab PT Goals Patient Stated Goal: Return to  her home Potential to Achieve Goals: Good Progress towards PT goals: Progressing toward goals    Frequency  Min 3X/week    PT Plan Current plan remains appropriate    Co-evaluation             End of Session Equipment Utilized During Treatment: Gait belt Activity Tolerance: Patient limited by fatigue Patient left: in chair;with call bell/phone within reach;with chair alarm set     Time: 1407-1440 PT Time Calculation (min) (ACUTE ONLY): 33 min  Charges:  $Gait Training: 8-22 mins $Therapeutic Exercise: 8-22 mins                    G Codes:      Cristela Blue 06/22/2015, 2:51 PM  Governor Rooks, PTA pager [phone]

## 2015-06-12 NOTE — Progress Notes (Signed)
12 Days Post-Op  Subjective: States that she feels "fair" this morning. Transfused 2u PRBCs yesterday with response in hgb to 10.4 from 7.4.  Tolerated full liquids.  Ostomy continues to be productive. Inquiring as to possible pain medication that is not morphine given ileus.  Objective: Vital signs in last 24 hours: Temp:  [97.7 F (36.5 C)-98.8 F (37.1 C)] 98.1 F (36.7 C) (05/17 0404) Pulse Rate:  [85-117] 113 (05/17 0404) Resp:  [18-20] 19 (05/17 0404) BP: (98-130)/(46-75) 109/61 mmHg (05/17 0404) SpO2:  [90 %-96 %] 92 % (05/17 0404) Weight:  [75.388 kg (166 lb 3.2 oz)] 75.388 kg (166 lb 3.2 oz) (05/17 0403) Last BM Date: 06/11/15  Intake/Output from previous day: 05/16 0701 - 05/17 0700 In: 2023.5 [P.O.:1020; I.V.:233.5; Blood:670; IV Piggyback:100] Out: 1200 [Urine:800; Stool:400] Intake/Output this shift:    General appearance: alert, cooperative, appears stated age, no distress and laying comfortably in bed.  Daughter at bedside. Resp: Nonlabored respirations.  Bilateral chest wall expansion. GI: Soft, mild ttp in the llq.  incision clean dry and intact.  Ostomy in LLQ with air and liquid stool in appliance.  Stoma pink and viable.  Lab Results:   Recent Labs  06/11/15 1955 06/12/15 0517  WBC 13.9* 15.9*  HGB 9.2* 10.4*  HCT 28.3* 33.2*  PLT 272 270   BMET  Recent Labs  06/11/15 0430 06/12/15 0517  NA 139 138  K 3.5 3.6  CL 104 106  CO2 25 23  GLUCOSE 139* 120*  BUN 25* 18  CREATININE 1.18* 1.03*  CALCIUM 8.5* 8.5*   PT/INR  Recent Labs  06/11/15 0430 06/12/15 0517  LABPROT 15.1 15.0  INR 1.18 1.16   ABG No results for input(s): PHART, HCO3 in the last 72 hours.  Invalid input(s): PCO2, PO2  Studies/Results: Dg Abd Portable 2v  06/11/2015  CLINICAL DATA:  Abdominal pain at the left lower surgical site this afternoon. Status post sigmoid colectomy with descending colon colostomy and drainage of a pelvic abscess. EXAM: PORTABLE ABDOMEN -  2 VIEW COMPARISON:  06/07/2015 FINDINGS: Mild dilation of loops of small bowel is evident, decreased when compared to the prior radiographs. There are air-fluid levels on the decubitus view. Findings are consistent with a diffuse adynamic ileus. No free air. Residual contrast is noted in the rectum. Clips are upper quadrant reflect a prior cholecystectomy, stable. Surgical skin staples lie along the low anterior abdominal wall, also stable. IMPRESSION: 1. Although there is less small bowel dilation on the prior study, there is still mild small bowel dilation with multiple air-fluid levels consistent with an adynamic ileus. No convincing obstruction. No free air. Electronically Signed   By: Lajean Manes M.D.   On: 06/11/2015 16:52    Anti-infectives: Anti-infectives    Start     Dose/Rate Route Frequency Ordered Stop   06/14/15 0000  piperacillin-tazobactam (ZOSYN) IVPB 3.375 g  Status:  Discontinued     3.375 g 12.5 mL/hr over 240 Minutes Intravenous Every 8 hours 06/12/15 0721 06/12/15 0722   06/12/15 0722  piperacillin-tazobactam (ZOSYN) IVPB 3.375 g     3.375 g 12.5 mL/hr over 240 Minutes Intravenous Every 8 hours 06/12/15 0722     06/06/15 0000  piperacillin-tazobactam (ZOSYN) 3.375 GM/50ML IVPB     3.375 g 12.5 mL/hr over 240 Minutes Intravenous Every 8 hours 06/06/15 1338     06/06/15 0000  vancomycin (VANCOCIN) 50 mg/mL oral solution        06/06/15 1338  06/05/15 1200  vancomycin (VANCOCIN) 50 mg/mL oral solution 125 mg     125 mg Oral Every 6 hours 06/05/15 0943     05/31/15 2000  metroNIDAZOLE (FLAGYL) IVPB 500 mg  Status:  Discontinued     500 mg 100 mL/hr over 60 Minutes Intravenous Every 8 hours 05/31/15 1852 06/05/15 0932   05/29/15 1200  piperacillin-tazobactam (ZOSYN) IVPB 3.375 g  Status:  Discontinued     3.375 g 12.5 mL/hr over 240 Minutes Intravenous Every 8 hours 05/29/15 0943 06/12/15 0721   05/27/15 1200  piperacillin-tazobactam (ZOSYN) IVPB 2.25 g  Status:   Discontinued     2.25 g 100 mL/hr over 30 Minutes Intravenous Every 6 hours 05/27/15 0832 05/29/15 0943   05/27/15 1200  vancomycin (VANCOCIN) 50 mg/mL oral solution 125 mg  Status:  Discontinued     125 mg Oral Every 6 hours 05/27/15 0957 05/31/15 1852   05/27/15 1000  metroNIDAZOLE (FLAGYL) IVPB 500 mg  Status:  Discontinued     500 mg 100 mL/hr over 60 Minutes Intravenous Every 8 hours 05/27/15 0957 05/28/15 1617   05/27/15 0600  cefoTEtan (CEFOTAN) 2 g in dextrose 5 % 50 mL IVPB  Status:  Discontinued     2 g 100 mL/hr over 30 Minutes Intravenous To ShortStay [facility] 05/26/15 0832 05/27/15 1051   05/26/15 1300  metroNIDAZOLE (FLAGYL) tablet 500 mg  Status:  Discontinued     500 mg Oral 3 times per day on Sun 05/26/15 0928 05/26/15 0931   05/26/15 1300  neomycin (MYCIFRADIN) tablet 1,000 mg     1,000 mg Oral 3 times per day on Sun 05/26/15 0930 05/26/15 2139   05/26/15 1300  metroNIDAZOLE (FLAGYL) tablet 1,000 mg     1,000 mg Oral 3 times per day on Sun 05/26/15 0931 05/26/15 2139   05/26/15 0832  metroNIDAZOLE (FLAGYL) tablet 500 mg  Status:  Discontinued    Comments:  Take 2 pills (=1000mg ) by mouth at 1pm, 3pm, and 10pm the day before your colorectal operation   500 mg Oral As directed 05/26/15 [id number] 05/26/15 0925   05/26/15 0832  neomycin (MYCIFRADIN) tablet 500 mg  Status:  Discontinued     500 mg Oral As directed 05/26/15 [id number] 05/26/15 0930   05/26/15 0815  fluconazole (DIFLUCAN) IVPB 200 mg  Status:  Discontinued     200 mg 100 mL/hr over 60 Minutes Intravenous Every 24 hours 05/26/15 0811 06/03/15 1555   05/26/15 0600  piperacillin-tazobactam (ZOSYN) IVPB 3.375 g  Status:  Discontinued     3.375 g 12.5 mL/hr over 240 Minutes Intravenous Every 8 hours 05/25/15 2155 05/27/15 0832   05/25/15 1900  piperacillin-tazobactam (ZOSYN) IVPB 3.375 g     3.375 g 100 mL/hr over 30 Minutes Intravenous  Once 05/25/15 1851 05/25/15 2002      Assessment/Plan: s/p Procedure(s): SIGMOID  COLECTOMY (N/A) DESCENDING COLOSTOMY (N/A) SMALL BOWEL RESECTION (N/A) DRAINAGE OF PELVIC ABSCESS (N/A) 82 yo F s/p exlap for colonic obstruction and Hartmann's procedure  -Hgb stable this morning after transfusion.  Would continue to hold anticoagulation, while noting her elevated risk for stroke, to verify stability in hgb.  Will check cbc at 6pm.   If stable this pm, could restart hep gtt without bolus. -Continue on full liquids.  Imaging with evidence of ileus yesterday, would prefer to have further demonstration of bowel function before progressing diet.  Possible advancement in diet tomorrow.   -Continue to encourage ambulation with PT.  LOS: 18 days  Reino Kent 06/12/2015

## 2015-06-12 NOTE — Progress Notes (Signed)
Nystatin mouthwash ordered by MD for oral thrush

## 2015-06-12 NOTE — Progress Notes (Addendum)
Nutrition Follow-up  DOCUMENTATION CODES:   Non-severe (moderate) malnutrition in context of chronic illness  INTERVENTION:   -D/c Ensure Enlive due to poor acceptance -Boost Breeze po TID, each supplement provides 250 kcal and 9 grams of protein -Magic Cup TID with meals  NUTRITION DIAGNOSIS:   Inadequate oral intake related to altered GI function as evidenced by per patient/family report.  Ongoing  GOAL:   Patient will meet greater than or equal to 90% of their needs  Unmet  MONITOR:   PO intake, Supplement acceptance, Labs, Weight trends, Skin, I & O's  REASON FOR ASSESSMENT:   Consult Calorie Count  ASSESSMENT:   Pt readmitted for diverticulitis w/ microperforation and colovesical fistula. S/P sigmoid colectomy and descending colostomy on 5/5.   Pt sleeping in recliner at time of visit.   Chart reviewed. Per MD notes, KUB indicated ileus without obstruction. Pt remains on a full liquid diet. MD hesitant to advance diet until further demonstration of bowel sounds. Noted potential for diet advancement tomorrow.   Observed tray on bedside table; pt consumed only about 50% of grits. Pt has been refusing Ensure supplements per MAR. Per RN, nystatin has been ordered due to oral thrush.   Noted colostomy with 400 ml output within the past 24 hours.   CIR admissions team still following for medical stability for transfer.   Labs reviewed: CBGS: 117-140.   Diet Order:  Diet - low sodium heart healthy Diet full liquid Room service appropriate?: Yes; Fluid consistency:: Thin  Skin:  Reviewed, no issues  Last BM:  06/11/15  Height:   Ht Readings from Last 1 Encounters:  05/25/15 5\' 5"  (1.651 m)    Weight:   Wt Readings from Last 1 Encounters:  06/12/15 166 lb 3.2 oz (75.388 kg)    Ideal Body Weight:  56.8 kg  BMI:  Body mass index is 27.66 kg/(m^2).  Estimated Nutritional Needs:   Kcal:  1500-1700  Protein:  70-80 gm  Fluid:  1.5-1.7 L  EDUCATION  NEEDS:   No education needs identified at this time  Mollye Guinta A. Jimmye Norman, RD, LDN, CDE Pager: [phone] After hours Pager: [phone]

## 2015-06-13 LAB — COMPREHENSIVE METABOLIC PANEL
ALBUMIN: 1.4 g/dL — AB (ref 3.5–5.0)
ALK PHOS: 83 U/L (ref 38–126)
ALT: 12 U/L — AB (ref 14–54)
AST: 17 U/L (ref 15–41)
Anion gap: 9 (ref 5–15)
BILIRUBIN TOTAL: 0.8 mg/dL (ref 0.3–1.2)
BUN: 13 mg/dL (ref 6–20)
CO2: 26 mmol/L (ref 22–32)
CREATININE: 0.89 mg/dL (ref 0.44–1.00)
Calcium: 8.4 mg/dL — ABNORMAL LOW (ref 8.9–10.3)
Chloride: 105 mmol/L (ref 101–111)
GFR calc Af Amer: >60 mL/min (ref >60)
GFR calc non Af Amer: 55 mL/min — ABNORMAL LOW (ref >60)
GLUCOSE: 92 mg/dL (ref 65–99)
Potassium: 3.2 mmol/L — ABNORMAL LOW (ref 3.5–5.1)
SODIUM: 140 mmol/L (ref 135–145)
TOTAL PROTEIN: 4.6 g/dL — AB (ref 6.5–8.1)

## 2015-06-13 LAB — CBC
HEMATOCRIT: 29.6 % — AB (ref 36.0–46.0)
Hemoglobin: 9.5 g/dL — ABNORMAL LOW (ref 12.0–15.0)
MCH: 27.1 pg (ref 26.0–34.0)
MCHC: 32.1 g/dL (ref 30.0–36.0)
MCV: 84.3 fL (ref 78.0–100.0)
PLATELETS: 271 10*3/uL (ref 150–400)
RBC: 3.51 MIL/uL — AB (ref 3.87–5.11)
RDW: 18.4 % — ABNORMAL HIGH (ref 11.5–15.5)
WBC: 12.2 10*3/uL — AB (ref 4.0–10.5)

## 2015-06-13 LAB — GLUCOSE, CAPILLARY
GLUCOSE-CAPILLARY: 97 mg/dL (ref 65–99)
GLUCOSE-CAPILLARY: 96 mg/dL (ref 65–99)
GLUCOSE-CAPILLARY: 87 mg/dL (ref 65–99)
Glucose-Capillary: 112 mg/dL — ABNORMAL HIGH (ref 65–99)
Glucose-Capillary: 127 mg/dL — ABNORMAL HIGH (ref 65–99)
Glucose-Capillary: 141 mg/dL — ABNORMAL HIGH (ref 65–99)

## 2015-06-13 LAB — PROTIME-INR
INR: 1.22 (ref 0.00–1.49)
Prothrombin Time: 15.6 seconds — ABNORMAL HIGH (ref 11.6–15.2)

## 2015-06-13 MED ORDER — DOXYLAMINE SUCCINATE (SLEEP) 25 MG PO TABS
25.0000 mg | ORAL_TABLET | Freq: Every evening | ORAL | Status: DC | PRN
  Filled 2015-06-13: qty 1

## 2015-06-13 MED ORDER — POTASSIUM CHLORIDE 10 MEQ/100ML IV SOLN
10.0000 meq | INTRAVENOUS | Status: AC
  Administered 2015-06-13 (×3): 10 meq via INTRAVENOUS
  Filled 2015-06-13 (×3): qty 100

## 2015-06-13 MED ORDER — ZOLPIDEM TARTRATE 5 MG PO TABS
2.5000 mg | ORAL_TABLET | Freq: Every evening | ORAL | Status: DC | PRN
Start: 2015-06-13 — End: 2015-06-14
  Administered 2015-06-13: 2.5 mg via ORAL
  Filled 2015-06-13: qty 1

## 2015-06-13 MED ORDER — TORSEMIDE 20 MG PO TABS
80.0000 mg | ORAL_TABLET | Freq: Every day | ORAL | Status: DC
  Administered 2015-06-13 – 2015-06-14 (×2): 80 mg via ORAL
  Filled 2015-06-13 (×2): qty 4

## 2015-06-13 NOTE — Progress Notes (Signed)
Will remove catheter and do  Bladder scan every four hours and i/o  If needed Trenda Moots MD

## 2015-06-13 NOTE — Progress Notes (Signed)
Physical Therapy Treatment Patient Details Name: [name] MRN: [id number] DOB: 11/02/1923 Today's Date: 06/13/2015    History of Present Illness 23 F with known a fib/tachy-brady syndrome s/p pacemaker and other multiple comorbidities who has had several recurrent admission for diverticulitis and presents now with the same. States pain has never really abated since last discharge. Also with profound weakness and fatigue; Diverticulitis with possible perforation / abscess; s/p Sigmoid colectomy, descending colostomy, small bowel resection, placement of pelvic abscess drain    PT Comments    Pt performed increased mobility with max cues for encouragement.  Pt apprehensive at first but tolerated tx well.  Pt educated to use [facility] for toileting with help from staff.  Currently patient voiding in bed.    Follow Up Recommendations  CIR;Supervision/Assistance - 24 hour;Supervision for mobility/OOB     Equipment Recommendations  None recommended by PT    Recommendations for Other Services Rehab consult     Precautions / Restrictions Precautions Precautions: Fall Precaution Comments: IV lines and colostomy bag.   Restrictions Weight Bearing Restrictions: No    Mobility  Bed Mobility Overal bed mobility: Needs Assistance Bed Mobility: Supine to Sit     Supine to sit: Min assist     General bed mobility comments: Pt required assistance to advance to edge of bed.  Pt required cues for sequencing and hand placement.  Pt required assist for trunk elevation and advancement of B LEs out of bed.    Transfers Overall transfer level: Needs assistance Equipment used: Rolling walker (2 wheeled) Transfers: Sit to/from Stand Sit to Stand: Min assist Stand pivot transfers: Min assist       General transfer comment: Pt required cues for hand placement, forward weight shifting and upright posture.  Pt require assist to boost into standing from seated surface.    Ambulation/Gait Ambulation/Gait assistance: Min assist;Mod assist Ambulation Distance (Feet): 68 Feet Assistive device: Rolling walker (2 wheeled) Gait Pattern/deviations: Step-to pattern;Decreased stride length;Trunk flexed;Decreased step length - left;Decreased step length - right;Decreased weight shift to right Gait velocity: slow   General Gait Details: Pt required rest periods x2 in which patient reaches for front bar of RW to rest.  Pt required cues for safety to remove hand s from bar and re grab hand grips.  Pt required max cues for motivation to advance gait distance.     Stairs            Wheelchair Mobility    Modified Rankin (Stroke Patients Only)       Balance Overall balance assessment: Needs assistance Sitting-balance support: Feet supported Sitting balance-Leahy Scale: Good       Standing balance-Leahy Scale: Poor                      Cognition Arousal/Alertness: Awake/alert Behavior During Therapy: WFL for tasks assessed/performed Overall Cognitive Status: Within Functional Limits for tasks assessed Area of Impairment: Awareness           Awareness: Anticipatory   General Comments: Pt able to verbalize needing to go to the bathroom.    Exercises      General Comments        Pertinent Vitals/Pain Pain Assessment: Faces Faces Pain Scale: Hurts even more Pain Location: abdomen Pain Descriptors / Indicators: Grimacing;Guarding;Moaning Pain Intervention(s): Monitored during session;Repositioned    Home Living                      Prior Function  PT Goals (current goals can now be found in the care plan section) Acute Rehab PT Goals Patient Stated Goal: Return to her home Potential to Achieve Goals: Good Progress towards PT goals: Progressing toward goals    Frequency  Min 3X/week    PT Plan Current plan remains appropriate    Co-evaluation             End of Session Equipment Utilized  During Treatment: Gait belt Activity Tolerance: Patient limited by fatigue Patient left: in chair;with call bell/phone within reach;with chair alarm set     Time: [id number] PT Time Calculation (min) (ACUTE ONLY): 27 min  Charges:  $Gait Training: 8-22 mins $Therapeutic Activity: 8-22 mins                    G Codes:      Cristela Blue 07-06-2015, 3:12 PM  Governor Rooks, PTA pager [phone]

## 2015-06-13 NOTE — Progress Notes (Signed)
Patient incontinent x four and refuses to call staffing when she needs to use the bathroom. After last incontinence  Bladder scan was done as ordered and 435 cc of urine noted MD called for further orders.

## 2015-06-13 NOTE — Progress Notes (Signed)
Pt. Asked for chicken broth and daughter brought a can for her and she is drinking it now

## 2015-06-13 NOTE — Progress Notes (Signed)
Place foley as ordered and back 600 cc of clear yellow urine [place] Qua

## 2015-06-13 NOTE — Progress Notes (Signed)
13 Days Post-Op  Subjective: Complains of feeling tired  Objective: Vital signs in last 24 hours: Temp:  [98.1 F (36.7 C)-98.6 F (37 C)] 98.2 F (36.8 C) (05/18 0353) Pulse Rate:  [62-110] 70 (05/18 0353) Resp:  [18-19] 19 (05/18 0353) BP: (113-123)/(60-84) 113/84 mmHg (05/18 0353) SpO2:  [92 %-96 %] 96 % (05/18 0353) Weight:  [75.697 kg (166 lb 14.1 oz)] 75.697 kg (166 lb 14.1 oz) (05/18 0353) Last BM Date: 06/12/15  Intake/Output from previous day: 05/17 0701 - 05/18 0700 In: 969.8 [P.O.:720; I.V.:249.8] Out: 875 [Urine:875] Intake/Output this shift:    Resp: clear to auscultation bilaterally Cardio: regular rate and rhythm GI: soft, nontender. ostomy pink and productive. incision looks good  Lab Results:   Recent Labs  06/12/15 2020 06/13/15 0625  WBC 16.1* 12.2*  HGB 10.0* 9.5*  HCT 30.5* 29.6*  PLT 278 271   BMET  Recent Labs  06/12/15 0517 06/13/15 0625  NA 138 140  K 3.6 3.2*  CL 106 105  CO2 23 26  GLUCOSE 120* 92  BUN 18 13  CREATININE 1.03* 0.89  CALCIUM 8.5* 8.4*   PT/INR  Recent Labs  06/12/15 0517 06/13/15 0625  LABPROT 15.0 15.6*  INR 1.16 1.22   ABG No results for input(s): PHART, HCO3 in the last 72 hours.  Invalid input(s): PCO2, PO2  Studies/Results: Dg Abd Portable 2v  06/11/2015  CLINICAL DATA:  Abdominal pain at the left lower surgical site this afternoon. Status post sigmoid colectomy with descending colon colostomy and drainage of a pelvic abscess. EXAM: PORTABLE ABDOMEN - 2 VIEW COMPARISON:  06/07/2015 FINDINGS: Mild dilation of loops of small bowel is evident, decreased when compared to the prior radiographs. There are air-fluid levels on the decubitus view. Findings are consistent with a diffuse adynamic ileus. No free air. Residual contrast is noted in the rectum. Clips are upper quadrant reflect a prior cholecystectomy, stable. Surgical skin staples lie along the low anterior abdominal wall, also stable. IMPRESSION:  1. Although there is less small bowel dilation on the prior study, there is still mild small bowel dilation with multiple air-fluid levels consistent with an adynamic ileus. No convincing obstruction. No free air. Electronically Signed   By: Lajean Manes M.D.   On: 06/11/2015 16:52    Anti-infectives: Anti-infectives    Start     Dose/Rate Route Frequency Ordered Stop   06/14/15 0000  piperacillin-tazobactam (ZOSYN) IVPB 3.375 g  Status:  Discontinued     3.375 g 12.5 mL/hr over 240 Minutes Intravenous Every 8 hours 06/12/15 0721 06/12/15 0722   06/12/15 0722  piperacillin-tazobactam (ZOSYN) IVPB 3.375 g     3.375 g 12.5 mL/hr over 240 Minutes Intravenous Every 8 hours 06/12/15 0722     06/06/15 0000  piperacillin-tazobactam (ZOSYN) 3.375 GM/50ML IVPB     3.375 g 12.5 mL/hr over 240 Minutes Intravenous Every 8 hours 06/06/15 1338     06/06/15 0000  vancomycin (VANCOCIN) 50 mg/mL oral solution        06/06/15 1338     06/05/15 1200  vancomycin (VANCOCIN) 50 mg/mL oral solution 125 mg     125 mg Oral Every 6 hours 06/05/15 0943     05/31/15 2000  metroNIDAZOLE (FLAGYL) IVPB 500 mg  Status:  Discontinued     500 mg 100 mL/hr over 60 Minutes Intravenous Every 8 hours 05/31/15 1852 06/05/15 0932   05/29/15 1200  piperacillin-tazobactam (ZOSYN) IVPB 3.375 g  Status:  Discontinued  3.375 g 12.5 mL/hr over 240 Minutes Intravenous Every 8 hours 05/29/15 0943 06/12/15 0721   05/27/15 1200  piperacillin-tazobactam (ZOSYN) IVPB 2.25 g  Status:  Discontinued     2.25 g 100 mL/hr over 30 Minutes Intravenous Every 6 hours 05/27/15 0832 05/29/15 0943   05/27/15 1200  vancomycin (VANCOCIN) 50 mg/mL oral solution 125 mg  Status:  Discontinued     125 mg Oral Every 6 hours 05/27/15 0957 05/31/15 1852   05/27/15 1000  metroNIDAZOLE (FLAGYL) IVPB 500 mg  Status:  Discontinued     500 mg 100 mL/hr over 60 Minutes Intravenous Every 8 hours 05/27/15 0957 05/28/15 1617   05/27/15 0600  cefoTEtan  (CEFOTAN) 2 g in dextrose 5 % 50 mL IVPB  Status:  Discontinued     2 g 100 mL/hr over 30 Minutes Intravenous To [facility] 05/26/15 0832 05/27/15 1051   05/26/15 1300  metroNIDAZOLE (FLAGYL) tablet 500 mg  Status:  Discontinued     500 mg Oral 3 times per day on Sun 05/26/15 0928 05/26/15 0931   05/26/15 1300  neomycin (MYCIFRADIN) tablet 1,000 mg     1,000 mg Oral 3 times per day on Sun 05/26/15 0930 05/26/15 2139   05/26/15 1300  metroNIDAZOLE (FLAGYL) tablet 1,000 mg     1,000 mg Oral 3 times per day on Sun 05/26/15 0931 05/26/15 2139   05/26/15 0832  metroNIDAZOLE (FLAGYL) tablet 500 mg  Status:  Discontinued    Comments:  Take 2 pills (=1000mg ) by mouth at 1pm, 3pm, and 10pm the day before your colorectal operation   500 mg Oral As directed 05/26/15 [id number] 05/26/15 0925   05/26/15 0832  neomycin (MYCIFRADIN) tablet 500 mg  Status:  Discontinued     500 mg Oral As directed 05/26/15 [id number] 05/26/15 0930   05/26/15 0815  fluconazole (DIFLUCAN) IVPB 200 mg  Status:  Discontinued     200 mg 100 mL/hr over 60 Minutes Intravenous Every 24 hours 05/26/15 0811 06/03/15 1555   05/26/15 0600  piperacillin-tazobactam (ZOSYN) IVPB 3.375 g  Status:  Discontinued     3.375 g 12.5 mL/hr over 240 Minutes Intravenous Every 8 hours 05/25/15 2155 05/27/15 0832   05/25/15 1900  piperacillin-tazobactam (ZOSYN) IVPB 3.375 g     3.375 g 100 mL/hr over 30 Minutes Intravenous  Once 05/25/15 1851 05/25/15 2002      Assessment/Plan: s/p Procedure(s): SIGMOID COLECTOMY (N/A) DESCENDING COLOSTOMY (N/A) SMALL BOWEL RESECTION (N/A) DRAINAGE OF PELVIC ABSCESS (N/A) Advance diet. Start soft foods Will add abdominal binder for comfort Physical therapy Continue vanc/zosyn  LOS: 19 days    TOTH III,PAUL S 06/13/2015

## 2015-06-13 NOTE — Progress Notes (Signed)
Patient refused binder after it was ordered

## 2015-06-13 NOTE — Progress Notes (Signed)
Family Medicine Teaching Service Daily Progress Note Intern Pager: [phone]  Patient name: [name] Medical record number: [id number] Date of birth: [birth year] Age: 82 y.o. Gender: female  Primary Care Provider: [name], MD Consultants: ID, Surgery  Code Status: Full   Pt Overview and Major Events to Date:  4/28: Readmit for Diverticulitis with possible perforation / abscess 5/5: Hartmann's procedure  [name] is a 82 y.o. female presenting with LLQ abdominal pain. PMH is significant for atrial fibrillation, hx of CVA, hypertension, type 2 diabetes, tachycardia-bradycardia syndrome s/p pacemaker, prior pericardial effusion s/p pericardial window 12/16, sleep apnea (no CPAP), stroke (right frontal 2013), GI bleeding with hemorrhoids, and arthritis and recurrent diverticulitis.  # S/p Hartmann procedure on 5/5  following recurrent diverticulitis possibly perforation / abscess: Repeat Abdominal Xray with dynamic ileus. Patient put back on clear liquid diet. Lactic acid 1.1.  - repeat x-ray with ileus, full liquid - Antibiotics regiment per ID  2 weeks of zosyn post surgery (end 5-19) Vancomycin 125 mg PO q6hrs (end 5/19)  Then, at the end of 2 weeks prolonged taper of po vanco 125mg  bid for 1 week 125mg  qdy for 1 week 125mg  qoday for 2 weeks.  - Surgery recs for now   # Anemia: Ostomy with yellow/red stool this AM.  Hgb 10.4 on admit (baseline 11).  - Surgery following not concerned about the bleeding - Hgb trend 9.8 >>9.0 >>8.6>> 7.6>7.9>7.4>2 units> 10.4>9.5 - WBC 9.6 > 15.9>12.2 - Follow CBC    # Possible Incontinence, surgery of bladder for fistula  - Will get a bladder scan   #Resolved AoCKD Stage 3: Baseline Cr 1-1.2 - Continue BMETs, Scr 0.89 - Strict I/Os,800 - Consider Torsemide 80 mg PO ( home dose)   # C.difficule positive: Ostomy in place.  - PO vancomycin 150 mg QID (5/1 > 5/5),  IV flagyl 5/6- 5/9,  - PO vancomycin 150  mg QID (5/10>>), regiment as above  - Contact precautions   # Chronic Atrial fibrillation: Currently in afib. Ventricular Pacemaker for AF. INR 1.18 currently. CHADVASC 7,  Stroke risk was 11.2% per year in >90,000 patients ([place] Atrial Fibrillation Cohort Study) and 15.7% risk of stroke/TIA/systemic embolism. - Continue Verapamil  - Holding Digoxin  - Hold Warfarin for now until bleeding stops, consider restarting once stable  - SCDs  # Upper/Lower Extremity Edema.  On Demadex 80 mg daily at home. Lower extremity edema, 2+ pitting edema, upper extremity 2+ pitting edema  - Start Torsemide 80 mg PO   # DM2: Diet controlled. A1c 6.2 (12/2014)> 6.3. Has required 13 units of Novolog over last 24 hours.  - monitor cbgs; SSI sensitive    # Hypokalemia: K of 2.9> 3.5>3.6>3.2  Magnesium 1.8  - Replete per IV   FEN/GI: Soft diet, SLIV, PT/OT  Prophylaxis: SCDs  Disposition: Med-surg  Subjective:  Patient depressed this morning. States that she is frustrated with the recovery process. She didn't sleep last night with the Ambien, and is feel terrible. No nausea or vomiting.   Objective: Temp:  [98.1 F (36.7 C)-98.6 F (37 C)] 98.2 F (36.8 C) (05/18 0353) Pulse Rate:  [62-110] 70 (05/18 0353) Resp:  [18-19] 19 (05/18 0353) BP: (113-123)/(60-84) 113/84 mmHg (05/18 0353) SpO2:  [92 %-96 %] 96 % (05/18 0353) Weight:  [166 lb 14.1 oz (75.697 kg)] 166 lb 14.1 oz (75.697 kg) (05/18 0353) Physical Exam: General: NAD, lying in bed Cardiovascular: irregular rate, with 2/6  murmur, paced rhythm  Respiratory: CTAB, no increased WOB  Abdomen: Soft, incision site C/D/I with staples in place, colostomy bag with green/brown stool this AM, BS+  Ext: Upper extremity with 2+ pitting edema, lower extremitys  2+ pitting edema in legs.   Laboratory:  Recent Labs Lab 06/12/15 0517 06/12/15 2020 06/13/15 0625  WBC 15.9* 16.1* 12.2*  HGB 10.4* 10.0* 9.5*  HCT 33.2* 30.5* 29.6*  PLT 270  278 271    Recent Labs Lab 06/11/15 0430 06/12/15 0517 06/13/15 0625  NA 139 138 140  K 3.5 3.6 3.2*  CL 104 106 105  CO2 25 23 26   BUN 25* 18 13  CREATININE 1.18* 1.03* 0.89  CALCIUM 8.5* 8.5* 8.4*  PROT 4.8* 4.8* 4.6*  BILITOT 0.5 0.8 0.8  ALKPHOS 103 105 83  ALT 16 15 12*  AST 24 21 17   GLUCOSE 139* 120* 92    Imaging/Diagnostic Tests: Dg Abd Portable 2v  06/11/2015  CLINICAL DATA:  Abdominal pain at the left lower surgical site this afternoon. Status post sigmoid colectomy with descending colon colostomy and drainage of a pelvic abscess. EXAM: PORTABLE ABDOMEN - 2 VIEW COMPARISON:  06/07/2015 FINDINGS: Mild dilation of loops of small bowel is evident, decreased when compared to the prior radiographs. There are air-fluid levels on the decubitus view. Findings are consistent with a diffuse adynamic ileus. No free air. Residual contrast is noted in the rectum. Clips are upper quadrant reflect a prior cholecystectomy, stable. Surgical skin staples lie along the low anterior abdominal wall, also stable. IMPRESSION: 1. Although there is less small bowel dilation on the prior study, there is still mild small bowel dilation with multiple air-fluid levels consistent with an adynamic ileus. No convincing obstruction. No free air. Electronically Signed   By: Lajean Manes M.D.   On: 06/11/2015 16:52    Raja Caputi Cletis Media, MD 06/13/2015, 8:05 AM PGY-1, [place] Intern pager: [phone], text pages welcome

## 2015-06-13 NOTE — Progress Notes (Signed)
Patient finally agreed to get up to [facility] with help done very well with walker and one person to assist Needs a lot of encouragement to get up. Please see PT note . Up in chair today also.

## 2015-06-13 NOTE — Progress Notes (Signed)
Chaplain presented to the patient's room for spiritual care rounding.  The patient was sleeping at the time of this visit, Chaplain will follow up at a later time. Chaplain Yaakov Guthrie [phone]

## 2015-06-14 ENCOUNTER — Inpatient Hospital Stay (HOSPITAL_COMMUNITY)
Admission: AD | Admit: 2015-06-14 | Discharge: 2015-06-26 | DRG: 945 | Disposition: A | Discharge status: Home Health | Payer: Medicare Other | Source: Intra-hospital | Attending: Physical Medicine & Rehabilitation | Admitting: Physical Medicine & Rehabilitation

## 2015-06-14 DIAGNOSIS — Z9842 Cataract extraction status, left eye: Secondary | ICD-10-CM | POA: Diagnosis not present

## 2015-06-14 DIAGNOSIS — Z8673 Personal history of transient ischemic attack (TIA), and cerebral infarction without residual deficits: Secondary | ICD-10-CM

## 2015-06-14 DIAGNOSIS — E1122 Type 2 diabetes mellitus with diabetic chronic kidney disease: Secondary | ICD-10-CM | POA: Diagnosis present

## 2015-06-14 DIAGNOSIS — R609 Edema, unspecified: Secondary | ICD-10-CM

## 2015-06-14 DIAGNOSIS — Z950 Presence of cardiac pacemaker: Secondary | ICD-10-CM | POA: Diagnosis not present

## 2015-06-14 DIAGNOSIS — K921 Melena: Secondary | ICD-10-CM | POA: Insufficient documentation

## 2015-06-14 DIAGNOSIS — Z87898 Personal history of other specified conditions: Secondary | ICD-10-CM | POA: Diagnosis not present

## 2015-06-14 DIAGNOSIS — I482 Chronic atrial fibrillation, unspecified: Secondary | ICD-10-CM | POA: Diagnosis present

## 2015-06-14 DIAGNOSIS — A047 Enterocolitis due to Clostridium difficile: Secondary | ICD-10-CM | POA: Diagnosis present

## 2015-06-14 DIAGNOSIS — Z91041 Radiographic dye allergy status: Secondary | ICD-10-CM

## 2015-06-14 DIAGNOSIS — B9689 Other specified bacterial agents as the cause of diseases classified elsewhere: Secondary | ICD-10-CM | POA: Diagnosis present

## 2015-06-14 DIAGNOSIS — Z961 Presence of intraocular lens: Secondary | ICD-10-CM | POA: Diagnosis present

## 2015-06-14 DIAGNOSIS — Z9841 Cataract extraction status, right eye: Secondary | ICD-10-CM | POA: Diagnosis not present

## 2015-06-14 DIAGNOSIS — E876 Hypokalemia: Secondary | ICD-10-CM | POA: Diagnosis present

## 2015-06-14 DIAGNOSIS — Z881 Allergy status to other antibiotic agents status: Secondary | ICD-10-CM

## 2015-06-14 DIAGNOSIS — D62 Acute posthemorrhagic anemia: Secondary | ICD-10-CM | POA: Diagnosis present

## 2015-06-14 DIAGNOSIS — I5032 Chronic diastolic (congestive) heart failure: Secondary | ICD-10-CM | POA: Diagnosis present

## 2015-06-14 DIAGNOSIS — Z9109 Other allergy status, other than to drugs and biological substances: Secondary | ICD-10-CM | POA: Diagnosis not present

## 2015-06-14 DIAGNOSIS — Z933 Colostomy status: Secondary | ICD-10-CM | POA: Diagnosis not present

## 2015-06-14 DIAGNOSIS — Z7901 Long term (current) use of anticoagulants: Secondary | ICD-10-CM

## 2015-06-14 DIAGNOSIS — N183 Chronic kidney disease, stage 3 unspecified: Secondary | ICD-10-CM | POA: Diagnosis present

## 2015-06-14 DIAGNOSIS — I82621 Acute embolism and thrombosis of deep veins of right upper extremity: Secondary | ICD-10-CM | POA: Insufficient documentation

## 2015-06-14 DIAGNOSIS — I130 Hypertensive heart and chronic kidney disease with heart failure and stage 1 through stage 4 chronic kidney disease, or unspecified chronic kidney disease: Secondary | ICD-10-CM | POA: Diagnosis present

## 2015-06-14 DIAGNOSIS — R600 Localized edema: Secondary | ICD-10-CM | POA: Insufficient documentation

## 2015-06-14 DIAGNOSIS — E669 Obesity, unspecified: Secondary | ICD-10-CM | POA: Diagnosis present

## 2015-06-14 DIAGNOSIS — Z5181 Encounter for therapeutic drug level monitoring: Secondary | ICD-10-CM | POA: Insufficient documentation

## 2015-06-14 DIAGNOSIS — Z79899 Other long term (current) drug therapy: Secondary | ICD-10-CM | POA: Diagnosis not present

## 2015-06-14 DIAGNOSIS — K5720 Diverticulitis of large intestine with perforation and abscess without bleeding: Secondary | ICD-10-CM | POA: Diagnosis present

## 2015-06-14 DIAGNOSIS — R5381 Other malaise: Secondary | ICD-10-CM | POA: Diagnosis present

## 2015-06-14 DIAGNOSIS — Z939 Artificial opening status, unspecified: Secondary | ICD-10-CM

## 2015-06-14 DIAGNOSIS — D649 Anemia, unspecified: Secondary | ICD-10-CM | POA: Diagnosis present

## 2015-06-14 LAB — CBC
HEMATOCRIT: 30.2 % — AB (ref 36.0–46.0)
Hemoglobin: 9.5 g/dL — ABNORMAL LOW (ref 12.0–15.0)
MCH: 26.5 pg (ref 26.0–34.0)
MCHC: 31.5 g/dL (ref 30.0–36.0)
MCV: 84.1 fL (ref 78.0–100.0)
Platelets: 330 10*3/uL (ref 150–400)
RBC: 3.59 MIL/uL — AB (ref 3.87–5.11)
RDW: 18.3 % — AB (ref 11.5–15.5)
WBC: 9.4 10*3/uL (ref 4.0–10.5)

## 2015-06-14 LAB — COMPREHENSIVE METABOLIC PANEL
ALT: 14 U/L (ref 14–54)
AST: 19 U/L (ref 15–41)
Albumin: 1.4 g/dL — ABNORMAL LOW (ref 3.5–5.0)
Alkaline Phosphatase: 91 U/L (ref 38–126)
Anion gap: 10 (ref 5–15)
BUN: 11 mg/dL (ref 6–20)
CHLORIDE: 102 mmol/L (ref 101–111)
CO2: 29 mmol/L (ref 22–32)
Calcium: 8.1 mg/dL — ABNORMAL LOW (ref 8.9–10.3)
Creatinine, Ser: 1.11 mg/dL — ABNORMAL HIGH (ref 0.44–1.00)
GFR, EST AFRICAN AMERICAN: 49 mL/min — AB (ref >60)
GFR, EST NON AFRICAN AMERICAN: 42 mL/min — AB (ref >60)
Glucose, Bld: 95 mg/dL (ref 65–99)
POTASSIUM: 2.4 mmol/L — AB (ref 3.5–5.1)
Sodium: 141 mmol/L (ref 135–145)
Total Bilirubin: 0.7 mg/dL (ref 0.3–1.2)
Total Protein: 4.7 g/dL — ABNORMAL LOW (ref 6.5–8.1)

## 2015-06-14 LAB — MAGNESIUM: MAGNESIUM: 1.5 mg/dL — AB (ref 1.7–2.4)

## 2015-06-14 LAB — GLUCOSE, CAPILLARY
GLUCOSE-CAPILLARY: 90 mg/dL (ref 65–99)
GLUCOSE-CAPILLARY: 154 mg/dL — AB (ref 65–99)
GLUCOSE-CAPILLARY: 141 mg/dL — AB (ref 65–99)
Glucose-Capillary: 87 mg/dL (ref 65–99)
Glucose-Capillary: 109 mg/dL — ABNORMAL HIGH (ref 65–99)

## 2015-06-14 LAB — PROTIME-INR
INR: 1.3 (ref 0.00–1.49)
PROTHROMBIN TIME: 16.3 s — AB (ref 11.6–15.2)

## 2015-06-14 MED ORDER — SORBITOL 70 % SOLN: 30.0000 mL | Freq: Every day | Status: DC | PRN

## 2015-06-14 MED ORDER — SACCHAROMYCES BOULARDII 250 MG PO CAPS
250.0000 mg | ORAL_CAPSULE | Freq: Two times a day (BID) | ORAL | Status: DC
  Administered 2015-06-14 – 2015-06-26 (×24): 250 mg via ORAL
  Filled 2015-06-14 (×24): qty 1

## 2015-06-14 MED ORDER — VERAPAMIL HCL ER 120 MG PO TBCR
120.0000 mg | EXTENDED_RELEASE_TABLET | Freq: Every day | ORAL | Status: DC
  Administered 2015-06-14 – 2015-06-26 (×13): 120 mg via ORAL
  Filled 2015-06-14 (×16): qty 1

## 2015-06-14 MED ORDER — PIPERACILLIN-TAZOBACTAM 3.375 G IVPB: 3.3750 g | Freq: Three times a day (TID) | INTRAVENOUS | Status: DC

## 2015-06-14 MED ORDER — GUAIFENESIN 200 MG PO TABS
200.0000 mg | ORAL_TABLET | Freq: Two times a day (BID) | ORAL | Status: DC
  Administered 2015-06-14 – 2015-06-26 (×24): 200 mg via ORAL
  Filled 2015-06-14 (×25): qty 1

## 2015-06-14 MED ORDER — WARFARIN SODIUM 5 MG PO TABS: 2.5000 mg | ORAL_TABLET | ORAL | Status: DC

## 2015-06-14 MED ORDER — PANTOPRAZOLE SODIUM 40 MG PO TBEC
40.0000 mg | DELAYED_RELEASE_TABLET | Freq: Every day | ORAL | Status: DC
  Administered 2015-06-15 – 2015-06-26 (×11): 40 mg via ORAL
  Filled 2015-06-14 (×12): qty 1

## 2015-06-14 MED ORDER — POTASSIUM CHLORIDE CRYS ER 20 MEQ PO TBCR: 40.0000 meq | EXTENDED_RELEASE_TABLET | Freq: Once | ORAL | Status: DC

## 2015-06-14 MED ORDER — ONDANSETRON HCL 4 MG PO TABS: 4.0000 mg | ORAL_TABLET | Freq: Four times a day (QID) | ORAL | Status: DC | PRN

## 2015-06-14 MED ORDER — VANCOMYCIN 50 MG/ML ORAL SOLUTION
125.0000 mg | Freq: Four times a day (QID) | ORAL | Status: DC
  Administered 2015-06-14 – 2015-06-17 (×11): 125 mg via ORAL
  Filled 2015-06-14 (×13): qty 2.5

## 2015-06-14 MED ORDER — TORSEMIDE 20 MG PO TABS: 20.0000 mg | ORAL_TABLET | Freq: Every day | ORAL | Status: DC

## 2015-06-14 MED ORDER — POTASSIUM CHLORIDE CRYS ER 20 MEQ PO TBCR
40.0000 meq | EXTENDED_RELEASE_TABLET | Freq: Two times a day (BID) | ORAL | Status: DC
  Administered 2015-06-14: 40 meq via ORAL
  Filled 2015-06-14: qty 2

## 2015-06-14 MED ORDER — BOOST / RESOURCE BREEZE PO LIQD: 1.0000 | Freq: Three times a day (TID) | ORAL | Status: DC

## 2015-06-14 MED ORDER — PIPERACILLIN-TAZOBACTAM 3.375 G IVPB
3.3750 g | Freq: Three times a day (TID) | INTRAVENOUS | Status: DC
  Administered 2015-06-15 – 2015-06-17 (×8): 3.375 g via INTRAVENOUS
  Filled 2015-06-14 (×9): qty 50

## 2015-06-14 MED ORDER — TORSEMIDE 20 MG PO TABS
80.0000 mg | ORAL_TABLET | Freq: Every day | ORAL | Status: DC
  Administered 2015-06-15 – 2015-06-16 (×2): 80 mg via ORAL
  Filled 2015-06-14 (×2): qty 4

## 2015-06-14 MED ORDER — POTASSIUM CHLORIDE 10 MEQ/100ML IV SOLN
10.0000 meq | INTRAVENOUS | Status: AC
  Administered 2015-06-14 (×5): 10 meq via INTRAVENOUS
  Filled 2015-06-14 (×3): qty 100

## 2015-06-14 MED ORDER — SILVER SULFADIAZINE 1 % EX CREA
TOPICAL_CREAM | CUTANEOUS | Status: DC | PRN
  Filled 2015-06-14: qty 85

## 2015-06-14 MED ORDER — ONDANSETRON HCL 4 MG/2ML IJ SOLN: 4.0000 mg | Freq: Four times a day (QID) | INTRAMUSCULAR | Status: DC | PRN

## 2015-06-14 MED ORDER — MAGNESIUM SULFATE 2 GM/50ML IV SOLN
2.0000 g | Freq: Once | INTRAVENOUS | Status: AC
  Administered 2015-06-14: 2 g via INTRAVENOUS
  Filled 2015-06-14: qty 50

## 2015-06-14 NOTE — Progress Notes (Signed)
Retta Diones, RN Rehab Admission Coordinator Addendum Physical Medicine and Rehabilitation PMR Pre-admission 06/06/2015 2:17 PM  Related encounter: ED to Hosp-Admission (Current) from 05/25/2015 in [facility] Collapse All   PMR Admission Coordinator Pre-Admission Assessment  Patient: [name] is an 82 y.o., female MRN: [id number] DOB: Nov 15, 1923 Height: 5\' 5"  (165.1 cm) (05/25/15 @ 1352) Weight: 73.846 kg (162 lb 12.8 oz)  Insurance Information HMO: PPO: PCP: IPA: 80/20: OTHER:  PRIMARY: Medicare A/B Policy#: [id number] Subscriber: Gaylyn Cheers CM Name: Phone#: Fax#:  Pre-Cert#: Employer: Retired Benefits: Phone #: Name: Checked in [place]. Date: 01/26/90 Deduct: $1316 Out of Pocket Max: none Life Max: unlimited CIR: 100% SNF: 100 days Outpatient: 80% Co-Pay: 20% Home Health: 100% Co-Pay: none DME: 80% Co-Pay: 20% Providers: patient's choice  SECONDARY: BCBS supplement Policy#: [id number] Subscriber: Gaylyn Cheers CM Name: Phone#: Fax#:  Pre-Cert#: Employer: Retired Benefits: Phone #: [phone] Name:  Eff. Date: Deduct: Out of Pocket Max: Life Max:  CIR: SNF:  Outpatient: Co-Pay:  Home Health: Co-Pay:  DME: Co-Pay:   Emergency Contact Information Contact Information    Name Relation Home Work Mobile   Cathey,Donna Daughter   [phone]     Current Medical History  Patient Admitting Diagnosis: Deconditioning related diverticulitis/multi-medical   History of Present Illness: A 82 y.o. 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 Patient lives alone independent prior to admission using furniture at times for balance. Her daughter lives in the area and checks on her as needed. She has another daughter whom she may stay with, if needed. Presented 05/25/2015 of left lower quadrant abdominal pain. INR on admission of 3.01. CT of the abdomen showed diverticulitis involving the sigmoid colon with previously noted extraluminal collection of gas worrisome for abscess with signs of perforation. No change with conservative care and underwent sigmoid colectomy, descending colostomy, small bowel resection with placement of pelvic abscess drain 05/31/2015 per Dr.Tsuei. Hospital course pain management. C. difficile colitis on contact precautions. TPN initiated for nutritional supportAnd diet slowly advanced. Postop urinary retention advisement to continue Foley tube for 7 days postoperatively. Intravenous heparin Initiated ongoing and Coumadin Resumed 06/06/2015. Leukocytosis 28,300 Placed on intravenous Zosyn/vancomycin for recurrent diverticulitis/duration until 06/14/2015 then at the end of 2 weeks prolonged taper of by mouth vancomycin. White blood cell count has improved to 9.6. Patient developed coffee-ground output from her ostomy 06/06/2015 with hemoglobin 8.5-9.0 with latest hemoglobin 7.4 05/16/2017And transfused with hemoglobin 9.5 . KUB of the abdomen showed small bowel dilatation with air-fluid levels consistent with small bowel obstruction versus ileus. Her diet was downgraded to liquid and slowly advanced to regular consistency and tolerating well. Physical therapy evaluation completed 06/02/2015 with recommendations of physical  medicine rehabilitation consult. Patient to be admitted for a comprehensive inpatient rehabilitation program.  Past Medical History  Past Medical History  Diagnosis Date  . Pericardial effusion     a. s/p pericardial window 01/16/15  . History of GI bleed     a. secondary to AVM's. Treated with Fe infusion  . HTN (hypertension)   . Obesity   . LVH (left ventricular hypertrophy)   . Sleep apnea     mild-no cpap  . Positive TB test   . Anemia   .  Stroke [facility])     a. 2013: right frontal  . Arthritis   . Tachycardia-bradycardia syndrome [facility])     a. s/p Medtronic [place], model number [id number], serial number [id number] H 12/2013  . History of shingles   . Chronic diastolic (congestive) heart failure ([place])     a. Echo 01/2015 with EF of 55-60%.  . Chronic atrial fibrillation (HCC)     a. on Coumadin, Digoxin, and Verapamil    Family History  family history includes Colon cancer in her daughter and sister; Pneumonia in her father; Stroke in her father and mother.  Prior Rehab/Hospitalizations: No previous rehab admissions.  Has the patient had major surgery during 100 days prior to admission? Yes. Had an effusion around her heart in Dec. 2016.  Current Medications   Current facility-administered medications:  . alum & mag hydroxide-simeth (MAALOX/MYLANTA) 200-200-20 MG/5ML suspension 30 mL, 30 mL, Oral, Q6H PRN, Michael Boston, MD . antiseptic oral rinse (CPC / CETYLPYRIDINIUM CHLORIDE 0.05%) solution 7 mL, 7 mL, Mouth Rinse, q12n4p, [name], MD, 7 mL at 06/14/15 1108 . benzocaine-Menthol (DERMOPLAST) 20-0.5 % topical spray 1 application, 1 application, Topical, QID PRN, [name], MD . chlorhexidine (PERIDEX) 0.12 % solution 15 mL, 15 mL, Mouth Rinse, BID, [name], MD, 15 mL at 06/14/15 0839 . feeding supplement (BOOST / RESOURCE BREEZE) liquid 1 Container, 1 Container, Oral,  TID BM, Alveda Reasons, MD, 1 Container at 06/13/15 1932 . guaiFENesin tablet 200 mg, 200 mg, Oral, BID, Lupita Dawn, MD, 200 mg at 06/14/15 1058 . insulin aspart (novoLOG) injection 0-15 Units, 0-15 Units, Subcutaneous, Q4H, Jaquita Folds, RPH, 2 Units at 06/14/15 1204 . lactated ringers infusion, , Intravenous, Continuous, [name], MD, Last Rate: 10 mL/hr at 06/13/15 0926 . lip balm (BLISTEX) ointment, , Topical, BID, Alveda Reasons, MD . magic mouthwash, 15 mL, Oral, QID PRN, Michael Boston, MD . menthol-cetylpyridinium (CEPACOL) lozenge 3 mg, 1 lozenge, Oral, PRN, Michael Boston, MD . ondansetron [facility]) tablet 4 mg, 4 mg, Oral, Q6H PRN, 4 mg at 05/25/15 2330 **OR** ondansetron (ZOFRAN) injection 4 mg, 4 mg, Intravenous, Q6H PRN, Olam Idler, MD, 4 mg at 06/10/15 1649 . pantoprazole (PROTONIX) EC tablet 40 mg, 40 mg, Oral, Daily, [name], MD, 40 mg at 06/14/15 1106 . piperacillin-tazobactam (ZOSYN) IVPB 3.375 g, 3.375 g, Intravenous, Q8H, [name], MD, 3.375 g at 06/14/15 0839 . potassium chloride 10 mEq in 100 mL IVPB, 10 mEq, Intravenous, Q1 Hr x 5, [name], MD, 10 mEq at 06/14/15 1253 . saccharomyces boulardii (FLORASTOR) capsule 250 mg, 250 mg, Oral, BID, Emina Riebock, NP, 250 mg at 06/14/15 1107 . silver sulfADIAZINE (SILVADENE) 1 % cream, , Topical, PRN, [name], MD . sodium chloride flush (NS) 0.9 % injection 10-40 mL, 10-40 mL, Intracatheter, Q12H, Coralie Keens, MD, 10 mL at 06/14/15 1101 . sodium chloride flush (NS) 0.9 % injection 10-40 mL, 10-40 mL, Intracatheter, PRN, Coralie Keens, MD, 20 mL at 06/10/15 1406 . sodium chloride flush (NS) 0.9 % injection 10-40 mL, 10-40 mL, Intracatheter, PRN, [name], MD, 10 mL at 06/13/15 1646 . torsemide (DEMADEX) tablet 80 mg, 80 mg, Oral, Daily, [name], MD, 80 mg at 06/14/15 1058 . vancomycin (VANCOCIN) 50 mg/mL oral solution 125  mg, 125 mg, Oral, Q6H, [name], MD, 125 mg at 06/14/15 1203 . verapamil (CALAN-SR) CR tablet 120 mg, 120 mg, Oral, Daily, Michael Boston, MD, 120 mg  at 06/14/15 1059 . zolpidem (AMBIEN) tablet 2.5 mg, 2.5 mg, Oral, QHS PRN, Alveda Reasons, MD, 2.5 mg at 06/13/15 2151  Patients Current Diet: Diet - low sodium heart healthy Diet regular Room service appropriate?: Yes; Fluid consistency:: Thin  Precautions / Restrictions Precautions Precautions: Fall Precaution Comments: IV lines and colostomy bag.  Restrictions Weight Bearing Restrictions: No   Has the patient had 2 or more falls or a fall with injury in the past year?No. Reports 1 fall about 6-8 weeks ago resulting in a sore bottom.  Prior Activity Level Household: Was driving up until a month ago. Now daughter driving. Most recently homebound going to MD appointments.  Home Assistive Devices / Equipment Home Assistive Devices/Equipment: Eyeglasses Home Equipment: Walker - 2 wheels  Prior Device Use: Indicate devices/aids used by the patient prior to current illness, exacerbation or injury? None - holds onto furniture.  Prior Functional Level Prior Function Level of Independence: Independent  Self Care: Did the patient need help bathing, dressing, using the toilet or eating? Independent  Indoor Mobility: Did the patient need assistance with walking from room to room (with or without device)? Independent  Stairs: Did the patient need assistance with internal or external stairs (with or without device)? Independent  Functional Cognition: Did the patient need help planning regular tasks such as shopping or remembering to take medications? Independent  Current Functional Level Cognition  Overall Cognitive Status: Within Functional Limits for tasks assessed Orientation Level: Oriented X4 General Comments: Pt able to verbalize needing to go to the bathroom.   Extremity Assessment (includes  Sensation/Coordination)  Upper Extremity Assessment: Generalized weakness  Lower Extremity Assessment: Generalized weakness    ADLs       Mobility  Overal bed mobility: Needs Assistance Bed Mobility: Supine to Sit Supine to sit: Min assist Sit to supine: Mod assist General bed mobility comments: Pt required assistance to advance to edge of bed. Pt required cues for sequencing and hand placement. Pt required assist for trunk elevation and advancement of B LEs out of bed.     Transfers  Overall transfer level: Needs assistance Equipment used: Rolling walker (2 wheeled) Transfers: Sit to/from Stand Sit to Stand: Min assist Stand pivot transfers: Min assist General transfer comment: Pt required cues for hand placement, forward weight shifting and upright posture. Pt require assist to boost into standing from seated surface.     Ambulation / Gait / Stairs / Wheelchair Mobility  Ambulation/Gait Ambulation/Gait assistance: Min assist, Mod assist Ambulation Distance (Feet): 68 Feet Assistive device: Rolling walker (2 wheeled) Gait Pattern/deviations: Step-to pattern, Decreased stride length, Trunk flexed, Decreased step length - left, Decreased step length - right, Decreased weight shift to right General Gait Details: Pt required rest periods x2 in which patient reaches for front bar of RW to rest. Pt required cues for safety to remove hand s from bar and re grab hand grips. Pt required max cues for motivation to advance gait distance.  Gait velocity: slow    Posture / Balance Dynamic Sitting Balance Sitting balance - Comments: Very nice ability to scoot hips back toward center of bed while sitting EOB Balance Overall balance assessment: Needs assistance Sitting-balance support: Feet supported Sitting balance-Leahy Scale: Good Sitting balance - Comments: Very nice ability to scoot hips back toward center of bed while sitting EOB Standing balance support:  Bilateral upper extremity supported Standing balance-Leahy Scale: Poor Standing balance comment: UE support    Special needs/care consideration BiPAP/CPAP No CPM No Continuous Drip  IV No Dialysis No  Life Vest No Oxygen Yes, has 02 at home but does not use it much Special Bed No Trach Size No Wound Vac (area) No  Skin Large post op abdominal dressings, right side JP drain removed 06/06/15, has a colostomy left abdominal area  Bowel mgmt: Colostomy Bladder mgmt: Urinary catheter removed 06/06/15 Diabetic mgmt Currently on insulin now in the acute hospital, not a diabetic previously Enteric Precautions: Yes   Previous Home Environment Living Arrangements: Alone Available Help at Discharge: Family, Available PRN/intermittently Type of Home: House Home Layout: Two level Alternate Level Stairs-Rails: Left Alternate Level Stairs-Number of Steps: 12 Home Access: Stairs to enter Entrance Stairs-Rails: Left Entrance Stairs-Number of Steps: 2 Bathroom Shower/Tub: Tub/shower unit [place]: No  Discharge Living Setting Plans for Discharge Living Setting: Patient's home, Alone, House (Lived alone, but may go home with daughter.) Type of Home at Discharge: House Discharge Home Layout: Two level, Able to live on main level with bedroom/bathroom Alternate Level Stairs-Number of Steps: 1 Does the patient have any problems obtaining your medications?: No  Social/Family/Support Systems Patient Roles: Parent (Widow with 3 daughters.) Contact Information: Melinda Crutch - daughter Anticipated Caregiver: [name] Anticipated Caregiver's Contact Information: [name] - daughter [phone] Ability/Limitations of Caregiver: May go home with youngest daughter, [name]. Oldest daughter is [name] and she lives 1 mile away. Caregiver Availability: 24/7 Discharge Plan Discussed with Primary Caregiver: Yes Is Caregiver In Agreement with Plan?: Yes Does  Caregiver/Family have Issues with Lodging/Transportation while Pt is in Rehab?: No  Goals/Additional Needs Patient/Family Goal for Rehab: PT/OT mod I and supervision goals Expected length of stay: 7-10 days Cultural Considerations: None Dietary Needs: Regular diet, thin liquids Equipment Needs: TBD Pt/Family Agrees to Admission and willing to participate: Yes Program Orientation Provided & Reviewed with Pt/Caregiver Including Roles & Responsibilities: Yes  Decrease burden of Care through IP rehab admission: N/A  Possible need for SNF placement upon discharge: Not planned  Patient Condition: This patient's medical and functional status has changed since the consult dated: 06/03/15 in which the Rehabilitation Physician determined and documented that the patient's condition is appropriate for intensive rehabilitative care in an inpatient rehabilitation facility. See "History of Present Illness" (above) for medical update. Functional changes are: Currently requiring min/mod assist to ambulate 68 feet RW. Attempted to admit to acute inpatient rehab on 06/05/15 but rehab MD did not feel patient was ready medically. Patient has now been cleared by surgeon and medical team as stable for inpatient rehab. Patient's medical and functional status update has been discussed with the Rehabilitation physician and patient remains appropriate for inpatient rehabilitation. Will admit to inpatient rehab today.  Preadmission Screen Completed By: Retta Diones, 06/14/2015 1:26 PM ______________________________________________________________________  Discussed status with Dr. Naaman Plummer on 06/14/15 at 1330 and received telephone approval for admission today.  Admission Coordinator: Retta Diones, time1330/Date05/19/17          Cosigned by: Meredith Staggers, MD at 06/14/2015 1:33 PM  Revision History     Date/Time User Provider Type Action   06/14/2015 1:33 PM Meredith Staggers, MD Physician Cosign     06/14/2015 1:30 PM Retta Diones, RN Rehab Admission Coordinator Addend   06/11/2015 12:49 PM Retta Diones, RN Rehab Admission Coordinator Incomplete Revision   06/06/2015 2:32 PM Charlett Blake, MD Physician Cosign   06/06/2015 2:31 PM Retta Diones, RN Rehab Admission Coordinator Sign   View Details Report

## 2015-06-14 NOTE — Progress Notes (Signed)
CRITICAL VALUE ALERT  Critical value received: K+ 2.4  Date of notification: 06/14/15  Time of notification:0617  Critical value read back:yes  Nurse who received alert: L. Dionicia Abler  MD notified (1st page):   Time of first page:0617  MD notified (2nd page):  Time of second page:  Responding MD:C. Gambino  Time MD [id number]

## 2015-06-14 NOTE — Progress Notes (Signed)
Family Medicine Teaching Service Daily Progress Note Intern Pager: [phone]  Patient name: [name] Medical record number: [id number] Date of birth: Jan 03, 1924 Age: 82 y.o. Gender: female  Primary Care Provider: [name], MD Consultants: ID, Surgery  Code Status: Full   Pt Overview and Major Events to Date:  4/28: Readmit for Diverticulitis with possible perforation / abscess 5/5: Hartmann's procedure  Assessment and Plan: [name] is a 82 y.o. female presenting with LLQ abdominal pain. PMH is significant for atrial fibrillation, hx of CVA, hypertension, type 2 diabetes, tachycardia-bradycardia syndrome s/p pacemaker, prior pericardial effusion s/p pericardial window 12/16, sleep apnea (no CPAP), stroke (right frontal 2013), GI bleeding with hemorrhoids, and arthritis and recurrent diverticulitis.  # S/p Hartmann procedure on 5/5  following recurrent diverticulitis possibly perforation / abscess: Repeat Abdominal Xray with dynamic ileus. Lactic acid 1.1.  - Regular diet - Antibiotics regiment per ID  2 weeks of zosyn post surgery (end 5-19) Vancomycin 125 mg PO q6hrs (end 5/19)  Then, at the end of 2 weeks prolonged taper of po vanco 125mg  bid for 1 week 125mg  qdy for 1 week 125mg  qoday for 2 weeks.  - Surgery recs for now   # Anemia: Ostomy with Green/Yellow stool.  Hgb 10.4 on admit (baseline 11).  - Surgery following not concerned about the bleeding - Hgb trend 9.8 >>9.0 >>8.6>> 7.6>7.9>7.4>2 units> 10.4>9.5>9.5  - WBC 9.6 > 15.9>12.2 - Follow CBC    # Possible Incontinence, surgery of bladder for fistula. Bladder scan was done as ordered and 435 cc was found in bladder following voiding in bed.  - Bladder scan q4hrs, bladder scans  - bladder scan 163, 0, 93, 174   # CKD Stage 3: Baseline Cr 1-1.2 - Continue BMETs, Scr 0.89>1.11 - Strict I/Os,1.95 L  # C.difficule positive: OPO vancomycin 150 mg QID (5/1 > 5/5),  IV flagyl 5/6- 5/9, PO vancomycin  150 mg QID (5/10>>) - regiment as above  - Contact precautions   # Chronic Atrial fibrillation: Currently in afib. Ventricular Pacemaker for AF. INR 1.18 currently. CHADVASC 7,  Stroke risk was 11.2% per year in >90,000 patients ([place] Atrial Fibrillation Cohort Study) and 15.7% risk of stroke/TIA/systemic embolism. - Continue Verapamil  - Holding Digoxin  - Start warfarin tomorrow if hgb stable  - SCDs  # Upper/Lower Extremity Edema. Improved this AM. On Demadex 80 mg daily at home. Lower extremity edema, 2+ pitting edema, upper extremity 2+ pitting edema  -  Hold on  Torsemide 80 mg PO today, due low potassium   # DM2: Diet controlled. A1c 6.2 (12/2014)> 6.3. Has required 13 units of Novolog over last 24 hours.  - monitor cbgs; SSI sensitive    # Hypokalemia: K of 2.9> 3.5>3.6>3.2> 2.4  Magnesium 1.8  - Replete per IV x5 and KDUR today  - Recheck Magnesium   FEN/GI: Soft diet, SLIV, PT/OT  Prophylaxis: SCDs  Disposition: Med-surg  Subjective:  Patient doing better this morning, with improved mood. She would really like to go physical therapy. Patient has been eating food, however does not like it. No nausea or vomiting   Objective: Temp:  [97.5 F (36.4 C)-98.1 F (36.7 C)] 97.5 F (36.4 C) (05/19 0607) Pulse Rate:  [68-110] 110 (05/19 0607) Resp:  [18] 18 (05/19 0607) BP: (107-122)/(71-74) 107/71 mmHg (05/19 0607) SpO2:  [92 %-96 %] 92 % (05/19 0607) Weight:  [162 lb 12.8 oz (73.846 kg)] 162 lb 12.8 oz (73.846 kg) (05/19 0413)  Physical Exam: General: NAD, lying in bed Cardiovascular: irregular rate, with 2/6 murmur, paced rhythm  Respiratory: CTAB, no increased WOB  Abdomen: Soft, staples in place, colostomy bag with green/brown stool this AM, BS+, slight ttp in LLQ Ext: Upper extremity with 2+ pitting edema, lower extremitys  2+ pitting edema in legs.   Laboratory:  Recent Labs Lab 06/12/15 2020 06/13/15 0625 06/14/15 0504  WBC 16.1* 12.2* 9.4  HGB  10.0* 9.5* 9.5*  HCT 30.5* 29.6* 30.2*  PLT 278 271 330    Recent Labs Lab 06/12/15 0517 06/13/15 0625 06/14/15 0504  NA 138 140 141  K 3.6 3.2* 2.4*  CL 106 105 102  CO2 23 26 29   BUN 18 13 11   CREATININE 1.03* 0.89 1.11*  CALCIUM 8.5* 8.4* 8.1*  PROT 4.8* 4.6* 4.7*  BILITOT 0.8 0.8 0.7  ALKPHOS 105 83 91  ALT 15 12* 14  AST 21 17 19   GLUCOSE 120* 92 95    Imaging/Diagnostic Tests: No results found.  Bama Hanselman Cletis Media, MD 06/14/2015, 9:09 AM PGY-1, [facility] Intern pager: [phone], text pages welcome

## 2015-06-14 NOTE — Progress Notes (Signed)
Ankit Lorie Phenix, MD Physician Signed Physical Medicine and Rehabilitation Consult Note 06/03/2015 6:21 AM  Related encounter: ED to Hosp-Admission (Current) from 05/25/2015 in [place] Collapse All        Physical Medicine and Rehabilitation Consult Reason for Consult: Deconditioning related diverticulitis/multi-medical Referring Physician: Family medicine   HPI: [name] is a 82 y.o. 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 Patient lives alone independent prior to admission using furniture at times for balance. Her daughter lives in the area and checks on her as needed. She has another daughter whom she may stay with, if needed. Presented 05/25/2015 of left lower quadrant abdominal pain. INR on admission of 3.01. CT of the abdomen showed diverticulitis involving the sigmoid colon with previously noted extraluminal collection of gas worrisome for abscess with signs of perforation. No change with conservative care and underwent sigmoid colectomy, descending colostomy, small bowel resection with placement of pelvic abscess drain 05/31/2015 per Dr.Tsuei. Hospital course pain management. C. difficile colitis range obtained on contact precautions. TPN initiated for nutritional support. Postop urinary retention advisement to continue Foley tube for 7 days postoperatively. Intravenous heparin ongoing await plans resume chronic Coumadin. Leukocytosis 28,300 and monitored. Intravenous Zosyn ongoing postoperatively question duration of antibiotic care. Physical therapy evaluation completed 06/02/2015 with recommendations of physical  medicine rehabilitation consult.  Review of Systems  Constitutional: Negative for fever and chills.  HENT: Negative for hearing loss.  Eyes: Negative for double vision.  Respiratory: Positive for shortness of breath. Negative for cough.  Cardiovascular: Negative for chest pain.  Gastrointestinal: Positive for abdominal pain.  Genitourinary: Positive for urgency. Negative for dysuria and hematuria.  Musculoskeletal: Positive for joint pain.  Skin: Negative for rash.  Neurological: Positive for weakness. Negative for seizures and headaches.  All other systems reviewed and are negative.  Past Medical History  Diagnosis Date  . Pericardial effusion     a. s/p pericardial window 01/16/15  . History of GI bleed     a. secondary to AVM's. Treated with Fe infusion  . HTN (hypertension)   . Obesity   . LVH (left ventricular hypertrophy)   . Sleep apnea     mild-no cpap  . Positive TB test   . Anemia   . Stroke [facility])     a. 2013: right frontal  . Arthritis   . Tachycardia-bradycardia syndrome [facility])     a. s/p Med[employer]onic En[place], model number [id number], serial number [id number] H 12/2013  . History of shingles   . Chronic diastolic (congestive) heart failure ([place])     a. Echo 01/2015 with EF of 55-60%.  . Chronic atrial fibrillation (H[facility])     a. on Coumadin, Digoxin, and Verapamil   Past Surgical History  Procedure Laterality Date  . Breast lumpectomy Bilateral     negative for cancer  . Esophagogastroduodenoscopy  05/19/2011    Procedure: ESOPHAGOGASTRODUODENOSCOPY (EGD); Surgeon: Lear Ng, MD; Location: [facility] ENDOSCOPY; Service: Endoscopy; Laterality: N/A; doctor aware of inr will try to be here no later than 230  . Cholecystectomy  2005  . Esophagogastroduodenoscopy  06/22/2011    Procedure: ESOPHAGOGASTRODUODENOSCOPY (EGD); Surgeon: Arta Silence, MD; Location: [facility]  ENDOSCOPY; Service: Endoscopy; Laterality: N/A; Check PT/INR in am  . Freda Munro  capsule study  06/23/2011    Procedure: GIVENS CAPSULE STUDY; Surgeon: Arta Silence, MD; Location: [facility] ENDOSCOPY; Service: Endoscopy; Laterality: N/A;  . Colonoscopy  08/11/2011    Procedure: COLONOSCOPY; Surgeon: Jeryl Columbia, MD; Location: [facility] ENDOSCOPY; Service: Endoscopy; Laterality: N/A;  . Hot hemostasis  08/11/2011    Procedure: HOT HEMOSTASIS (ARGON PLASMA COAGULATION/BICAP); Surgeon: Jeryl Columbia, MD; Location: [facility] ENDOSCOPY; Service: Endoscopy; Laterality: N/A;  . Mass excision Left 09/14/2013    Procedure: EXCISION MASS LEFT WRIST; Surgeon: Leanora Cover, MD; Location: [place]; Service: Orthopedics; Laterality: Left;  . Tonsillectomy and adenoidectomy  1950  . Insert / replace / remove pacemaker  2001    Last generator in 2006; interrogated Dec 2012  . Cataract extraction w/ intraocular lens implant, bilateral Bilateral   . Lipoma excision Left 07/2013    wrist  . Pacemaker generator change N/A 01/02/2014    Procedure: PACEMAKER GENERATOR CHANGE; Surgeon: Sanda Klein, MD; Location: [facility] CATH LAB; Service: Cardiovascular; Laterality: N/A;  . Lead revision N/A 01/02/2014    Procedure: LEAD REVISION; Surgeon: Sanda Klein, MD; Location: [place] CATH LAB; Service: Cardiovascular; Laterality: N/A;  . Subxyphoid pericardial window N/A 01/16/2015    Procedure: SUBXYPHOID PERICARDIAL WINDOW; Surgeon: Grace Isaac, MD; Location: [place]; Service: Thoracic; Laterality: N/A;  . Tee without cardioversion N/A 01/16/2015    Procedure: TRANSESOPHAGEAL ECHOCARDIOGRAM (TEE); Surgeon: Grace Isaac, MD; Location: [facility] OR; Service: Thoracic; Laterality: N/A;   Family History  Problem Relation Age of Onset  . Stroke Mother   . Stroke Father   . Pneumonia Father   . Colon cancer  Sister   . Colon cancer Daughter    Social History:  reports that she has never smoked. She has never used smokeless tobacco. She reports that she drinks alcohol. She reports that she does not use illicit drugs. Allergies:  Allergies  Allergen Reactions  . Anti-Inflammatory Enzyme [Nutritional Supplements] Other (See Comments)    Retains fluids and headaches  . Iodinated Diagnostic Agents Hives    HIVES 15MIN S/P IV CONTRAST INJECTION,WILL NEED 13 HR PREP FOR FUTURE INJECTIONS, ok s/p 50mg  po benadryl//a.calhoun  . Spironolactone Other (See Comments)    Hair loss  . Arthrotec [Diclofenac-Misoprostol] Other (See Comments)    unknown  . Biaxin [Clarithromycin] Other (See Comments)    unknown  . Plavix [Clopidogrel Bisulfate] Other (See Comments)    unknown   Medications Prior to Admission  Medication Sig Dispense Refill  . Digoxin 62.5 MCG TABS Take 0.0625 mg by mouth daily. Take only 5 days a week; skip Wednesdays and Sundays 30 tablet 6  . oxyCODONE (ROXICODONE) 5 MG immediate release tablet Take one half tablet in the morning as needed for back pain. (Patient taking differently: Take 2.5 mg by mouth daily as needed for severe pain. Take one half tablet in the morning as needed for back pain.) 20 tablet 0  . verapamil (CALAN-SR) 180 MG CR tablet take 1 tablet by mouth at bedtime (Patient taking differently: take 1 tablet by mouth ONCE DAILY) 30 tablet 11  . warfarin (COUMADIN) 5 MG tablet Take 2.5-5 mg by mouth as directed. Takes 0.5 tablet (2.5 mg total) by mouth Monday, Wednesday, Friday, Saturday. Take 1 tablet (5 mg total) by mouth Sunday, Tuesday, Thursday.    Marland Kitchen amoxicillin-clavulanate (AUGMENTIN) 500-125 MG tablet Take 1 tablet (500 mg total) by mouth every 12 (twelve) hours. Start tonight. (Patient not taking: Reported on 05/25/2015) 10 tablet 0  . gabapentin (NEURONTIN) 100  MG capsule Take 1 capsule (100  mg total) by mouth 3 (three) times daily. 90 capsule 3  . metolazone (ZAROXOLYN) 2.5 MG tablet Take 1 tablet (2.5 mg total) by mouth daily. (Patient not taking: Reported on 05/25/2015) 30 tablet 0  . ondansetron (ZOFRAN ODT) 4 MG disintegrating tablet Take 1 tablet (4 mg total) by mouth every 8 (eight) hours as needed for nausea or vomiting. (Patient not taking: Reported on 05/25/2015) 20 tablet 0  . potassium chloride (K-DUR,KLOR-CON) 10 MEQ tablet Take 2 tablets (20 mEq total) by mouth 2 (two) times daily. Take with Torsemide 120 tablet 0  . torsemide (DEMADEX) 20 MG tablet Take 4 tablets (80 mg total) by mouth daily. (Patient not taking: Reported on 05/25/2015) 120 tablet 0    Home: Home Living Family/patient expects to be discharged to:: Private residence Living Arrangements: Alone Available Help at Discharge: Family, Available PRN/intermittently Type of Home: House Home Access: Stairs to enter Technical brewer of Steps: 2 Entrance Stairs-Rails: Left Home Layout: Two level Alternate Level Stairs-Number of Steps: 12 Alternate Level Stairs-Rails: Left Bathroom Shower/Tub: Tub/shower unit Home Equipment: Environmental consultant - 2 wheels  Functional History: Prior Function Level of Independence: Independent Functional Status:  Mobility: Bed Mobility Overal bed mobility: Needs Assistance Bed Mobility: Sit to Supine Sit to supine: +2 for safety/equipment, Mod assist General bed mobility comments: mod assist to lift LEs into bed; PT, tech, and RN present and helpful to untangle multiple lines and leads Transfers Overall transfer level: Needs assistance Equipment used: Rolling walker (2 wheeled) Transfers: Sit to/from Stand Sit to Stand: Mod assist General transfer comment: Cues for hand placement and safety; Pt does not want power up assist at axillae; gave support at elbow to encourage shoulder girdle stabilizing and use Ambulation/Gait Ambulation/Gait assistance: Min  assist Ambulation Distance (Feet): 3 Feet (pivot and sidesteps back t bed) Assistive device: Rolling walker (2 wheeled) Gait Pattern/deviations: Shuffle General Gait Details: modest instability, dependence on RW for safety at this time    ADL:    Cognition: Cognition Overall Cognitive Status: Impaired/Different from baseline Orientation Level: Oriented X4 Cognition Arousal/Alertness: Awake/alert Behavior During Therapy: WFL for tasks assessed/performed Overall Cognitive Status: Impaired/Different from baseline Area of Impairment: Awareness Awareness: Anticipatory  Blood pressure 130/62, pulse 76, temperature 97.5 F (36.4 C), temperature source Axillary, resp. rate 16, height 5\' 5"  (1.651 m), weight 68.947 kg (152 lb), SpO2 94 %. Physical Exam  Vitals reviewed. Constitutional: She appears well-developed and well-nourished.  HENT:  Head: Normocephalic and atraumatic.  Eyes: Conjunctivae and EOM are normal.  Neck: Normal range of motion. Neck supple. No thyromegaly present.  Cardiovascular:  Murmur heard. Irregularly irregular  Respiratory: Effort normal and breath sounds normal. No respiratory distress.  GI: Soft. Bowel sounds are normal.  + NG. Colostomy in place.  Musculoskeletal: She exhibits edema. She exhibits no tenderness.  Neurological: She is alert.  A&O2 HOH Sensation intact light touch neck sign DTRs symmetric Motor: Bilateral upper extremities 5/5 proximal to distal Left lower extremity: Hip flexion, knee extension 3 -/5, ankle dorsi/plantar flexion were +/5 (baseline) Right lower extremity: Hip flexion, knee extension 4/5, ankle dorsi/plantarflexion 5/5  Skin: Skin is warm and dry.  Psychiatric: She has a normal mood and affect. Her behavior is normal.     Lab Results Last 24 Hours    Results for orders placed or performed during the hospital encounter of 05/25/15 (from the past 24 hour(s))  Basic metabolic panel Status: Abnormal    Collection Time: 06/02/15 6:45  AM  Result Value Ref Range   Sodium 145 135 - 145 mmol/L   Potassium 4.2 3.5 - 5.1 mmol/L   Chloride 112 (H) 101 - 111 mmol/L   CO2 24 22 - 32 mmol/L   Glucose, Bld 194 (H) 65 - 99 mg/dL   BUN 36 (H) 6 - 20 mg/dL   Creatinine, Ser 1.10 (H) 0.44 - 1.00 mg/dL   Calcium 8.8 (L) 8.9 - 10.3 mg/dL   GFR calc non Af Amer 43 (L) >60 mL/min   GFR calc Af Amer 50 (L) >60 mL/min   Anion gap 9 5 - 15  Glucose, capillary Status: Abnormal   Collection Time: 06/02/15 7:54 AM  Result Value Ref Range   Glucose-Capillary 167 (H) 65 - 99 mg/dL  Heparin level (unfractionated) Status: None   Collection Time: 06/02/15 9:06 AM  Result Value Ref Range   Heparin Unfractionated 0.70 0.30 - 0.70 IU/mL  Glucose, capillary Status: Abnormal   Collection Time: 06/02/15 11:40 AM  Result Value Ref Range   Glucose-Capillary 167 (H) 65 - 99 mg/dL  Glucose, capillary Status: Abnormal   Collection Time: 06/02/15 4:01 PM  Result Value Ref Range   Glucose-Capillary 160 (H) 65 - 99 mg/dL  Heparin level (unfractionated) Status: None   Collection Time: 06/02/15 6:40 PM  Result Value Ref Range   Heparin Unfractionated 0.45 0.30 - 0.70 IU/mL  Glucose, capillary Status: Abnormal   Collection Time: 06/02/15 7:46 PM  Result Value Ref Range   Glucose-Capillary 139 (H) 65 - 99 mg/dL  Glucose, capillary Status: Abnormal   Collection Time: 06/02/15 11:50 PM  Result Value Ref Range   Glucose-Capillary 150 (H) 65 - 99 mg/dL  Glucose, capillary Status: Abnormal   Collection Time: 06/03/15 3:30 AM  Result Value Ref Range   Glucose-Capillary 133 (H) 65 - 99 mg/dL  Heparin level (unfractionated) Status: None   Collection Time: 06/03/15 5:00 AM  Result Value Ref Range   Heparin Unfractionated 0.37 0.30 - 0.70 IU/mL    Triglycerides Status: None   Collection Time: 06/03/15 5:12 AM  Result Value Ref Range   Triglycerides 75 <150 mg/dL  Comprehensive metabolic panel Status: Abnormal   Collection Time: 06/03/15 5:13 AM  Result Value Ref Range   Sodium 143 135 - 145 mmol/L   Potassium 3.8 3.5 - 5.1 mmol/L   Chloride 111 101 - 111 mmol/L   CO2 22 22 - 32 mmol/L   Glucose, Bld 152 (H) 65 - 99 mg/dL   BUN 47 (H) 6 - 20 mg/dL   Creatinine, Ser 1.27 (H) 0.44 - 1.00 mg/dL   Calcium 8.7 (L) 8.9 - 10.3 mg/dL   Total Protein 5.2 (L) 6.5 - 8.1 g/dL   Albumin 1.8 (L) 3.5 - 5.0 g/dL   AST 15 15 - 41 U/L   ALT 13 (L) 14 - 54 U/L   Alkaline Phosphatase 73 38 - 126 U/L   Total Bilirubin 0.4 0.3 - 1.2 mg/dL   GFR calc non Af Amer 36 (L) >60 mL/min   GFR calc Af Amer 42 (L) >60 mL/min   Anion gap 10 5 - 15  Magnesium Status: None   Collection Time: 06/03/15 5:13 AM  Result Value Ref Range   Magnesium 1.8 1.7 - 2.4 mg/dL  Phosphorus Status: None   Collection Time: 06/03/15 5:13 AM  Result Value Ref Range   Phosphorus 4.5 2.5 - 4.6 mg/dL  CBC Status: Abnormal   Collection Time: 06/03/15 5:13 AM  Result Value  Ref Range   WBC 15.1 (H) 4.0 - 10.5 K/uL   RBC 3.82 (L) 3.87 - 5.11 MIL/uL   Hemoglobin 9.8 (L) 12.0 - 15.0 g/dL   HCT 32.2 (L) 36.0 - 46.0 %   MCV 84.3 78.0 - 100.0 fL   MCH 25.7 (L) 26.0 - 34.0 pg   MCHC 30.4 30.0 - 36.0 g/dL   RDW 16.4 (H) 11.5 - 15.5 %   Platelets 283 150 - 400 K/uL  Differential Status: Abnormal   Collection Time: 06/03/15 5:13 AM  Result Value Ref Range   Neutrophils Relative % 87 %   Neutro Abs 13.1 (H) 1.7 - 7.7 K/uL   Lymphocytes Relative 7 %   Lymphs Abs 1.0 0.7 - 4.0 K/uL   Monocytes Relative 6 %   Monocytes Absolute 1.0 0.1 - 1.0 K/uL   Eosinophils Relative 0 %    Eosinophils Absolute 0.0 0.0 - 0.7 K/uL   Basophils Relative 0 %   Basophils Absolute 0.0 0.0 - 0.1 K/uL  Prealbumin Status: Abnormal   Collection Time: 06/03/15 5:13 AM  Result Value Ref Range   Prealbumin 17.1 (L) 18 - 38 mg/dL      Imaging Results (Last 48 hours)    No results found.    Assessment/Plan: Diagnosis: Deconditioning related diverticulitis/multi-medical Labs and images independently reviewed. Records reviewed and summated above.  1. Does the need for close, 24 hr/day medical supervision in concert with the patient's rehab needs make it unreasonable for this patient to be served in a less intensive setting? Yes  2. Co-Morbidities requiring supervision/potential complications: Diverticulitis (status post colostomy), chronic diastolic congestive heart failure (monitor weights, continue meds), diabetes mellitus (Monitor in accordance with exercise and adjust meds as necessary), atrial fibrillation with tachybrady syndrome (status post pacemaker,continue meds), HTN (monitor and provide prns in accordance with increased physical exertion and pain), posterior right frontal lobe with residual left-sided weakness , respiratory depression (wean narcotics as tolerated), pain management (Biofeedback training with therapies to help reduce reliance on opiate pain medications, monitor pain control during therapies, and sedation at rest and titrate to maximum efficacy to ensure participation and gains in therapies), C. Difficile (continue precautions), TPN (advance diet as tolerated), urinary retention (DC Foley when possible to limit chance of infection), leukocytosis (cont to monitor for signs and symptoms of infection, further workup if indicated), Tachycardia (monitor in accordance with pain and increasing activity), ABLA (transfuse if necessary to ensure appropriate perfusion for increased activity tolerance), AKI (avoid nephrotoxic meds) 3. Due to bladder  management, bowel management, safety, skin/wound care, disease management, medication administration, pain management and patient education, does the patient require 24 hr/day rehab nursing? Yes 4. Does the patient require coordinated care of a physician, rehab nurse, PT (1-2 hrs/day, 5 days/week), OT (1-2 hrs/day, 5 days/week) and SLP (1-2 hrs/day, 5 days/week) to address physical and functional deficits in the context of the above medical diagnosis(es)? Yes Addressing deficits in the following areas: balance, endurance, locomotion, strength, transferring, bowel/bladder control, bathing, dressing, toileting, cognition, swallowing and psychosocial support 5. Can the patient actively participate in an intensive therapy program of at least 3 hrs of therapy per day at least 5 days per week? Yes 6. The potential for patient to make measurable gains while on inpatient rehab is excellent 7. Anticipated functional outcomes upon discharge from inpatient rehab are modified independent and supervision with PT, modified independent and supervision with OT, independent and modified independent with SLP. 8. Estimated rehab length of stay to reach the  above functional goals is: 7-10 days. 9. Does the patient have adequate social supports and living environment to accommodate these discharge functional goals? Yes 10. Anticipated D/C setting: Home 11. Anticipated post D/C treatments: HH therapy and Home excercise program 12. Overall Rehab/Functional Prognosis: excellent  RECOMMENDATIONS: This patient's condition is appropriate for continued rehabilitative care in the following setting: CIR once medically appropriate. Patient has agreed to participate in recommended program. Yes Note that insurance prior authorization may be required for reimbursement for recommended care.  Comment: Rehab Admissions Coordinator to follow up.  Delice Lesch, MD 06/03/2015       Revision History     Date/Time User Provider  Type Action   06/03/2015 2:34 PM Ankit Lorie Phenix, MD Physician Sign   06/03/2015 6:46 AM Cathlyn Parsons, PA-C Physician Assistant [place]   View Details Report       Routing History     Date/Time From To Method   06/03/2015 2:34 PM Ankit Lorie Phenix, MD Darlyne Russian, MD In [place]

## 2015-06-14 NOTE — Clinical Social Work Note (Signed)
CSW received referral for SNF.  Patient has been admitted to inpatient rehab and plan is to discharge to CIR today.  CSW to sign off please re-consult if social work needs arise.  Jones Broom. [place], MSW, [place]

## 2015-06-14 NOTE — Progress Notes (Signed)
Talked to the nurse and he told me the patient had a BM, this in not unusual early on after an ostomy and was likely left over stool from before the surgery.  It had some blood in it, this is likely old, Hgb is stable from yesterday.  WBC normal today.  On soft diet and protein supplements.  Needs to mobilize more.  Potassium being supplemented.  Patient refuses abdominal binder.    Her follow up has been arranged, will sign off.  Call with questions/concerns.  Ready for CIR from our perspective.

## 2015-06-14 NOTE — Progress Notes (Signed)
Rehab admissions - I spoke with attending MD.  Patient is medically ready for inpatient rehab admission.  Rehab MD did see patient this am and has approved inpatient rehab admission for today.  Bed available and will admit to acute inpatient rehab today.  Call me for questions.  RC:[id number]

## 2015-06-14 NOTE — Interval H&P Note (Signed)
[name] was admitted today to Inpatient Rehabilitation with the diagnosis of debility.  The patient's history has been reviewed, patient examined, and there is no change in status.  Patient continues to be appropriate for intensive inpatient rehabilitation.  I have reviewed the patient's chart and labs.  Questions were answered to the patient's satisfaction. The PAPE has been reviewed and assessment remains appropriate.  Arshan Jabs T 06/14/2015, 5:16 PM

## 2015-06-14 NOTE — Progress Notes (Signed)
Nurse reviewed rehab booklet, rehab schedule, safety plan and expectation of patient and family.

## 2015-06-14 NOTE — Care Management Important Message (Signed)
Important Message  Patient Details  Name: [name] MRN: [id number] Date of Birth: 1923/12/13   Medicare Important Message Given:  Yes    Marilu Favre, RN 06/14/2015, 2:10 PM

## 2015-06-14 NOTE — H&P (View-Only) (Signed)
Physical Medicine and Rehabilitation Admission H&P    Chief Complaint  Patient presents with  . Abdominal Pain  : HPI: [name] is a 82 y.o. 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 Patient lives alone independent prior to admission using furniture at times for balance. Her daughter lives in the area and checks on her as needed. She has another daughter whom she may stay with, if needed. Presented 05/25/2015 of left lower quadrant abdominal pain. INR on admission of 3.01. CT of the abdomen showed diverticulitis involving the sigmoid colon with previously noted extraluminal collection of gas worrisome for abscess with signs of perforation. No change with conservative care and underwent sigmoid colectomy, descending colostomy, small bowel resection with placement of pelvic abscess drain 05/31/2015 per Dr.Tsuei. Hospital course pain management. C. difficile colitis   on contact precautions. TPN initiated for nutritional supportAnd diet slowly advanced. Postop urinary retention advisement to continue Foley tube for 7 days postoperatively. Intravenous heparin Initiated ongoing and  Coumadin Resumed 06/06/2015. Leukocytosis 28,300 Placed on intravenous Zosyn/vancomycin for recurrent diverticulitis/duration until 06/14/2015 then at the end of 2 weeks prolonged taper of by mouth vancomycin. White blood cell count has improved to 9.6. Patient developed coffee-ground output from her ostomy 06/06/2015 with hemoglobin 8.5-9.0 with latest hemoglobin 7.4 05/16/2017And transfused with hemoglobin 9.5 . KUB of the abdomen showed small bowel dilatation with air-fluid levels consistent with  small bowel obstruction versus ileus. Her diet was downgraded to liquid and slowly advanced to regular consistency and tolerating well. Physical therapy evaluation completed 06/02/2015 with recommendations of physical medicine rehabilitation consult.Patient was admitted for a comprehensive rehabilitation program  ROS Constitutional: Negative for fever and chills.  HENT: Negative for hearing loss.  Eyes: Negative for double vision.  Respiratory: Positive for shortness of breath. Negative for cough.  Cardiovascular: Negative for chest pain.  Gastrointestinal: Positive for abdominal pain.  Genitourinary: Positive for urgency. Negative for dysuria and hematuria.  Musculoskeletal: Positive for joint pain.  Skin: Negative for rash.  Neurological: Positive for weakness. Negative for seizures and headaches.  All other systems reviewed and are negative   Past Medical History  Diagnosis Date  . Pericardial effusion     a. s/p pericardial window 01/16/15  . History of GI bleed     a. secondary to AVM's. Treated with Fe infusion  . HTN (hypertension)   . Obesity   . LVH (left ventricular hypertrophy)   . Sleep apnea     mild-no cpap  . Positive TB test   . Anemia   . Stroke [facility])     a. 2013: right frontal  . Arthritis   . Tachycardia-bradycardia syndrome [facility])     a. s/p [employer]onic [place], model number [id number], serial number [id number] H 12/2013  . History of shingles   . Chronic diastolic (congestive) heart failure ([place])     a. Echo 01/2015 with EF of 55-60%.  . Chronic atrial fibrillation (HCC)     a. on Coumadin, Digoxin, and Verapamil   Past Surgical History  Procedure Laterality Date  . Breast lumpectomy Bilateral     negative for cancer  . Esophagogastroduodenoscopy  05/19/2011    Procedure: ESOPHAGOGASTRODUODENOSCOPY (EGD);  Surgeon: Lear Ng, MD;  Location: [facility] ENDOSCOPY;  Service: Endoscopy;  Laterality: N/A;  doctor  aware of inr   will try to be here no later than  230  . Cholecystectomy  2005  . Esophagogastroduodenoscopy  06/22/2011    Procedure: ESOPHAGOGASTRODUODENOSCOPY (EGD);  Surgeon: Arta Silence, MD;  Location: [facility] ENDOSCOPY;  Service: Endoscopy;  Laterality: N/A;  Check PT/INR in am  . Givens capsule study  06/23/2011    Procedure: GIVENS CAPSULE STUDY;  Surgeon: Arta Silence, MD;  Location: [facility] ENDOSCOPY;  Service: Endoscopy;  Laterality: N/A;  . Colonoscopy  08/11/2011    Procedure: COLONOSCOPY;  Surgeon: Jeryl Columbia, MD;  Location: [facility] ENDOSCOPY;  Service: Endoscopy;  Laterality: N/A;  . Hot hemostasis  08/11/2011    Procedure: HOT HEMOSTASIS (ARGON PLASMA COAGULATION/BICAP);  Surgeon: Jeryl Columbia, MD;  Location: [facility] ENDOSCOPY;  Service: Endoscopy;  Laterality: N/A;  . Mass excision Left 09/14/2013    Procedure: EXCISION MASS LEFT WRIST;  Surgeon: Leanora Cover, MD;  Location: [place];  Service: Orthopedics;  Laterality: Left;  . Tonsillectomy and adenoidectomy  1950  . Insert / replace / remove pacemaker  2001    Last generator in 2006; interrogated Dec 2012  . Cataract extraction w/ intraocular lens  implant, bilateral Bilateral   . Lipoma excision Left 07/2013    wrist  . Pacemaker generator change N/A 01/02/2014    Procedure: PACEMAKER GENERATOR CHANGE;  Surgeon: Sanda Klein, MD;  Location: [place] CATH LAB;  Service: Cardiovascular;  Laterality: N/A;  . Lead revision N/A 01/02/2014    Procedure: LEAD REVISION;  Surgeon: Sanda Klein, MD;  Location: [place] CATH LAB;  Service: Cardiovascular;  Laterality: N/A;  . Subxyphoid pericardial window N/A 01/16/2015    Procedure: SUBXYPHOID PERICARDIAL WINDOW;  Surgeon: Grace Isaac, MD;  Location: [place];  Service: Thoracic;  Laterality: N/A;  . Tee without cardioversion N/A 01/16/2015    Procedure: TRANSESOPHAGEAL ECHOCARDIOGRAM (TEE);  Surgeon: Grace Isaac, MD;  Location: [place];  Service: Thoracic;  Laterality: N/A;  . Colon resection N/A 05/31/2015    Procedure: SIGMOID  COLECTOMY;  Surgeon: Donnie Mesa, MD;  Location: [place];  Service: General;  Laterality: N/A;  . Colostomy N/A 05/31/2015    Procedure: DESCENDING COLOSTOMY;  Surgeon: Donnie Mesa, MD;  Location: [place];  Service: General;  Laterality: N/A;  . Bowel resection N/A 05/31/2015    Procedure: SMALL BOWEL RESECTION;  Surgeon: Donnie Mesa, MD;  Location: [place];  Service: General;  Laterality: N/A;  . Debridement of abdominal wall abscess N/A 05/31/2015    Procedure: DRAINAGE OF PELVIC ABSCESS;  Surgeon: Donnie Mesa, MD;  Location: [facility] OR;  Service: General;  Laterality: N/A;   Family History  Problem Relation Age of Onset  . Stroke Mother   . Stroke Father   . Pneumonia Father   . Colon cancer Sister   . Colon cancer Daughter    Social History:  reports that she has never smoked. She has never used smokeless tobacco. She reports that she drinks alcohol. She reports that she does not use illicit drugs. Allergies:  Allergies  Allergen Reactions  . Anti-Inflammatory Enzyme [Nutritional Supplements] Other (See Comments)    Retains fluids and headaches  . Iodinated Diagnostic Agents Hives    HIVES 15MIN S/P IV CONTRAST INJECTION,WILL NEED 13 HR PREP FOR FUTURE INJECTIONS, ok s/p 56m po benadryl//a.calhoun  . Spironolactone Other (See Comments)    Hair loss  . Arthrotec [Diclofenac-Misoprostol] Other (See Comments)    unknown  . Biaxin [Clarithromycin] Other (See Comments)    unknown  .  Plavix [Clopidogrel Bisulfate] Other (See Comments)    unknown   Medications Prior to Admission  Medication Sig Dispense Refill  . Digoxin 62.5 MCG TABS Take 0.0625 mg by mouth daily. Take only 5 days a week; skip Wednesdays and Sundays 30 tablet 6  . oxyCODONE (ROXICODONE) 5 MG immediate release tablet Take one half tablet in the morning as needed for back pain. (Patient taking differently: Take 2.5 mg by mouth daily as needed for severe pain. Take one half tablet in the morning as needed for back pain.) 20  tablet 0  . verapamil (CALAN-SR) 180 MG CR tablet take 1 tablet by mouth at bedtime (Patient taking differently: take 1 tablet by mouth ONCE DAILY) 30 tablet 11  . warfarin (COUMADIN) 5 MG tablet Take 2.5-5 mg by mouth as directed. Takes 0.5 tablet (2.5 mg total) by mouth Monday, Wednesday, Friday, Saturday. Take 1 tablet (5 mg total) by mouth Sunday, Tuesday, Thursday.    Marland Kitchen amoxicillin-clavulanate (AUGMENTIN) 500-125 MG tablet Take 1 tablet (500 mg total) by mouth every 12 (twelve) hours. Start tonight. (Patient not taking: Reported on 05/25/2015) 10 tablet 0  . gabapentin (NEURONTIN) 100 MG capsule Take 1 capsule (100 mg total) by mouth 3 (three) times daily. 90 capsule 3  . metolazone (ZAROXOLYN) 2.5 MG tablet Take 1 tablet (2.5 mg total) by mouth daily. (Patient not taking: Reported on 05/25/2015) 30 tablet 0  . ondansetron (ZOFRAN ODT) 4 MG disintegrating tablet Take 1 tablet (4 mg total) by mouth every 8 (eight) hours as needed for nausea or vomiting. (Patient not taking: Reported on 05/25/2015) 20 tablet 0  . potassium chloride (K-DUR,KLOR-CON) 10 MEQ tablet Take 2 tablets (20 mEq total) by mouth 2 (two) times daily. Take with Torsemide 120 tablet 0  . [DISCONTINUED] torsemide (DEMADEX) 20 MG tablet Take 4 tablets (80 mg total) by mouth daily. (Patient not taking: Reported on 05/25/2015) 120 tablet 0    Home: Home Living Family/patient expects to be discharged to:: Private residence Living Arrangements: Alone Available Help at Discharge: Family, Available PRN/intermittently Type of Home: House Home Access: Stairs to enter Technical brewer of Steps: 2 Entrance Stairs-Rails: Left Home Layout: Two level Alternate Level Stairs-Number of Steps: 12 Alternate Level Stairs-Rails: Left Bathroom Shower/Tub: Tub/shower unit Home Equipment: Walker - 2 wheels   Functional History: Prior Function Level of Independence: Independent  Functional Status:  Mobility: Bed Mobility Overal bed  mobility: Needs Assistance Bed Mobility: Supine to Sit Supine to sit: Min assist Sit to supine: Mod assist General bed mobility comments: Pt required assistance to advance to edge of bed.  Pt required cues for sequencing and hand placement.  Pt required assist for trunk elevation and advancement of B LEs out of bed.   Transfers Overall transfer level: Needs assistance Equipment used: Rolling walker (2 wheeled) Transfers: Sit to/from Stand Sit to Stand: Min assist Stand pivot transfers: Min assist General transfer comment: Pt required cues for hand placement, forward weight shifting and upright posture.  Pt require assist to boost into standing from seated surface.  Ambulation/Gait Ambulation/Gait assistance: Min assist, Mod assist Ambulation Distance (Feet): 68 Feet Assistive device: Rolling walker (2 wheeled) Gait Pattern/deviations: Step-to pattern, Decreased stride length, Trunk flexed, Decreased step length - left, Decreased step length - right, Decreased weight shift to right General Gait Details: Pt required rest periods x2 in which patient reaches for front bar of RW to rest.  Pt required cues for safety to remove hand s from bar and re grab  hand grips.  Pt required max cues for motivation to advance gait distance.   Gait velocity: slow    ADL:    Cognition: Cognition Overall Cognitive Status: Within Functional Limits for tasks assessed Orientation Level: Oriented X4 Cognition Arousal/Alertness: Awake/alert Behavior During Therapy: WFL for tasks assessed/performed Overall Cognitive Status: Within Functional Limits for tasks assessed Area of Impairment: Awareness Awareness: Anticipatory General Comments: Pt able to verbalize needing to go to the bathroom.  Physical Exam: Blood pressure 107/71, pulse 110, temperature 97.5 F (36.4 C), temperature source Oral, resp. rate 18, height _0  (1.651 m), weight 73.846 kg (162 lb 12.8 oz), SpO2 92 %.    Physical  Exam Constitutional: She appears well-developed and well-nourished.  HENT: dentition fair Head: Normocephalic and atraumatic.  Eyes: Conjunctivae and EOM are normal.  Neck: Normal range of motion. Neck supple. No thyromegaly present.  Cardiovascular:  Murmur. No rubs or gallops Irregularly irregular  Respiratory: Effort normal and breath sounds normal. No respiratory distress.  GI: Soft. Bowel sounds are normal.  Colostomy in place. Abdominal incision c/d/i. Pain in LLQ Musculoskeletal: She exhibits edema. She exhibits no tenderness.  Neurological: She is alert.  A&O2 HOH Sensation intact light touch neck sign DTRs symmetric Motor: Bilateral upper extremities 5/5 proximal to distal Left lower extremity: Hip flexion, knee extension 3 -/5, ankle dorsi/plantar flexion were 5/5 Right lower extremity: Hip flexion 3-/5, knee extension 3/5, ankle dorsi/plantarflexion 5/5  Skin: Skin is warm and dry.  Psychiatric: She has a normal mood and affect. Her behavior is normal   Results for orders placed or performed during the hospital encounter of 05/25/15 (from the past 48 hour(s))  Glucose, capillary     Status: Abnormal   Collection Time: 06/12/15  4:40 PM  Result Value Ref Range   Glucose-Capillary 113 (H) 65 - 99 mg/dL   Comment 1 Notify RN   CBC     Status: Abnormal   Collection Time: 06/12/15  8:20 PM  Result Value Ref Range   WBC 16.1 (H) 4.0 - 10.5 K/uL   RBC 3.58 (L) 3.87 - 5.11 MIL/uL   Hemoglobin 10.0 (L) 12.0 - 15.0 g/dL   HCT 30.5 (L) 36.0 - 46.0 %   MCV 85.2 78.0 - 100.0 fL   MCH 27.9 26.0 - 34.0 pg   MCHC 32.8 30.0 - 36.0 g/dL   RDW 17.8 (H) 11.5 - 15.5 %   Platelets 278 150 - 400 K/uL  Glucose, capillary     Status: Abnormal   Collection Time: 06/12/15  8:20 PM  Result Value Ref Range   Glucose-Capillary 143 (H) 65 - 99 mg/dL  Glucose, capillary     Status: Abnormal   Collection Time: 06/12/15 11:51 PM  Result Value Ref Range   Glucose-Capillary 105 (H) 65 - 99  mg/dL  Glucose, capillary     Status: None   Collection Time: 06/13/15  3:51 AM  Result Value Ref Range   Glucose-Capillary 97 65 - 99 mg/dL  CBC     Status: Abnormal   Collection Time: 06/13/15  6:25 AM  Result Value Ref Range   WBC 12.2 (H) 4.0 - 10.5 K/uL   RBC 3.51 (L) 3.87 - 5.11 MIL/uL   Hemoglobin 9.5 (L) 12.0 - 15.0 g/dL   HCT 29.6 (L) 36.0 - 46.0 %   MCV 84.3 78.0 - 100.0 fL   MCH 27.1 26.0 - 34.0 pg   MCHC 32.1 30.0 - 36.0 g/dL   RDW 18.4 (H) 11.5 -  15.5 %   Platelets 271 150 - 400 K/uL  Protime-INR     Status: Abnormal   Collection Time: 06/13/15  6:25 AM  Result Value Ref Range   Prothrombin Time 15.6 (H) 11.6 - 15.2 seconds   INR 1.22 0.00 - 1.49  Comprehensive metabolic panel     Status: Abnormal   Collection Time: 06/13/15  6:25 AM  Result Value Ref Range   Sodium 140 135 - 145 mmol/L   Potassium 3.2 (L) 3.5 - 5.1 mmol/L   Chloride 105 101 - 111 mmol/L   CO2 26 22 - 32 mmol/L   Glucose, Bld 92 65 - 99 mg/dL   BUN 13 6 - 20 mg/dL   Creatinine, Ser 0.89 0.44 - 1.00 mg/dL   Calcium 8.4 (L) 8.9 - 10.3 mg/dL   Total Protein 4.6 (L) 6.5 - 8.1 g/dL   Albumin 1.4 (L) 3.5 - 5.0 g/dL   AST 17 15 - 41 U/L   ALT 12 (L) 14 - 54 U/L   Alkaline Phosphatase 83 38 - 126 U/L   Total Bilirubin 0.8 0.3 - 1.2 mg/dL   GFR calc non Af Amer 55 (L) >60 mL/min   GFR calc Af Amer >60 >60 mL/min    Comment: (NOTE) The eGFR has been calculated using the CKD EPI equation. This calculation has not been validated in all clinical situations. eGFR's persistently <60 mL/min signify possible Chronic Kidney Disease.    Anion gap 9 5 - 15  Glucose, capillary     Status: None   Collection Time: 06/13/15  7:44 AM  Result Value Ref Range   Glucose-Capillary 96 65 - 99 mg/dL   Comment 1 Notify RN   Glucose, capillary     Status: Abnormal   Collection Time: 06/13/15 11:28 AM  Result Value Ref Range   Glucose-Capillary 141 (H) 65 - 99 mg/dL   Comment 1 Notify RN   Glucose, capillary      Status: Abnormal   Collection Time: 06/13/15  5:30 PM  Result Value Ref Range   Glucose-Capillary 127 (H) 65 - 99 mg/dL   Comment 1 Notify RN   Glucose, capillary     Status: None   Collection Time: 06/13/15  7:24 PM  Result Value Ref Range   Glucose-Capillary 87 65 - 99 mg/dL  Glucose, capillary     Status: Abnormal   Collection Time: 06/13/15 11:32 PM  Result Value Ref Range   Glucose-Capillary 112 (H) 65 - 99 mg/dL  Glucose, capillary     Status: None   Collection Time: 06/14/15  4:21 AM  Result Value Ref Range   Glucose-Capillary 90 65 - 99 mg/dL  CBC     Status: Abnormal   Collection Time: 06/14/15  5:04 AM  Result Value Ref Range   WBC 9.4 4.0 - 10.5 K/uL   RBC 3.59 (L) 3.87 - 5.11 MIL/uL   Hemoglobin 9.5 (L) 12.0 - 15.0 g/dL   HCT 30.2 (L) 36.0 - 46.0 %   MCV 84.1 78.0 - 100.0 fL   MCH 26.5 26.0 - 34.0 pg   MCHC 31.5 30.0 - 36.0 g/dL   RDW 18.3 (H) 11.5 - 15.5 %   Platelets 330 150 - 400 K/uL  Protime-INR     Status: Abnormal   Collection Time: 06/14/15  5:04 AM  Result Value Ref Range   Prothrombin Time 16.3 (H) 11.6 - 15.2 seconds   INR 1.30 0.00 - 1.49  Comprehensive metabolic panel  Status: Abnormal   Collection Time: 06/14/15  5:04 AM  Result Value Ref Range   Sodium 141 135 - 145 mmol/L   Potassium 2.4 (LL) 3.5 - 5.1 mmol/L    Comment: CRITICAL RESULT CALLED TO, READ BACK BY AND VERIFIED WITH: ABEIERA,L RN [id number] 5.19.17 MCADOO,G    Chloride 102 101 - 111 mmol/L   CO2 29 22 - 32 mmol/L   Glucose, Bld 95 65 - 99 mg/dL   BUN 11 6 - 20 mg/dL   Creatinine, Ser 1.11 (H) 0.44 - 1.00 mg/dL   Calcium 8.1 (L) 8.9 - 10.3 mg/dL   Total Protein 4.7 (L) 6.5 - 8.1 g/dL   Albumin 1.4 (L) 3.5 - 5.0 g/dL   AST 19 15 - 41 U/L   ALT 14 14 - 54 U/L   Alkaline Phosphatase 91 38 - 126 U/L   Total Bilirubin 0.7 0.3 - 1.2 mg/dL   GFR calc non Af Amer 42 (L) >60 mL/min   GFR calc Af Amer 49 (L) >60 mL/min    Comment: (NOTE) The eGFR has been calculated using the CKD EPI  equation. This calculation has not been validated in all clinical situations. eGFR's persistently <60 mL/min signify possible Chronic Kidney Disease.    Anion gap 10 5 - 15  Glucose, capillary     Status: Abnormal   Collection Time: 06/14/15  7:44 AM  Result Value Ref Range   Glucose-Capillary 154 (H) 65 - 99 mg/dL   Comment 1 Notify RN   Magnesium     Status: Abnormal   Collection Time: 06/14/15  8:40 AM  Result Value Ref Range   Magnesium 1.5 (L) 1.7 - 2.4 mg/dL  Glucose, capillary     Status: Abnormal   Collection Time: 06/14/15 11:55 AM  Result Value Ref Range   Glucose-Capillary 141 (H) 65 - 99 mg/dL   Comment 1 Notify RN    No results found.     Medical Problem List and Plan: 1. Debilitation  secondary to perforated diverticulitis status post sigmoid colectomy, descending colostomy with small bowel resection placement of pelvic abscess drain 05/05/2017Complicated by GI bleed and ileus  -admit to inpatient rehab 2.  DVT Prophylaxis/Anticoagulation: Coumadin discontinued due to GI bleed. 3. Pain Management: Tylenol as needed 4. Acute blood loss anemia/GI bleed. Follow-up CBC 5. Neuropsych: This patient is capable of making decisions on her own behalf. 6. Skin/Wound Care: Routine skin checks/colostomy education and skin care  -wounds clean/intact, ostomy is sealed 7. Fluids/Electrolytes/Nutrition: Routine I&O's with follow-up chemistries 8.ID. Continue vancomycin and Zosyn through 06/14/2015 and then PO taper with vancomycin as directed then began vancomycin 125 mg twice a day 1 week then 125 mg daily 1 week then 125 mg every other day for 2 weeks and stop  -contact precautions 9.HTN.Verapamil 120 mg daily. 10. Atrial fibrillation. Tachybradycardia syndrome with pacemaker. Cardiac rate control. Coumadin discontinued due to GI bleed. 11. Chronic renal insufficiency. Baseline creatinine 1.68. Follow-up chemistries 12. Chronic diastolic congestive heart failure.Demadex 80  mg daily. Monitor for any signs of fluid overload. Weigh patient daily 13.C. difficile positive. Contact precautions 14. Diabetes mellitus. Diet controlled. Hemoglobin A1c 6.2. Sliding scale insulin. Check blood sugars before meals and at bedtime   Post Admission Physician Evaluation: 1. Functional deficits secondary  to debility after colectomy and multiple medical. 2. Patient is admitted to receive collaborative, interdisciplinary care between the physiatrist, rehab nursing staff, and therapy team. 3. Patient's level of medical complexity and substantial therapy needs in context  of that medical necessity cannot be provided at a lesser intensity of care such as a SNF. 4. Patient has experienced substantial functional loss from his/her baseline which was documented above under the "Functional History" and "Functional Status" headings.  Judging by the patient's diagnosis, physical exam, and functional history, the patient has potential for functional progress which will result in measurable gains while on inpatient rehab.  These gains will be of substantial and practical use upon discharge  in facilitating mobility and self-care at the household level. 5. Physiatrist will provide 24 hour management of medical needs as well as oversight of the therapy plan/treatment and provide guidance as appropriate regarding the interaction of the two. 6. 24 hour rehab nursing will assist with bladder management, bowel management, safety, skin/wound care, disease management, medication administration, pain management and patient education  and help integrate therapy concepts, techniques,education, etc. 7. PT will assess and treat for/with: Lower extremity strength, range of motion, stamina, balance, functional mobility, safety, adaptive techniques and equipment, activity tolerance, wound care, ego support.   Goals are: supervision to mod I. 8. OT will assess and treat for/with: ADL's, functional mobility, safety, upper  extremity strength, adaptive techniques and equipment, activity tolerance, ego support, community reintegration.   Goals are: supervision to mod I. Therapy may not yet proceed with showering this patient. 9. SLP will assess and treat for/with: n/a.  Goals are: n/a. 10. Case Management and Social Worker will assess and treat for psychological issues and discharge planning. 11. Team conference will be held weekly to assess progress toward goals and to determine barriers to discharge. 12. Patient will receive at least 3 hours of therapy per day at least 5 days per week. 13. ELOS: 13-17 days       14. Prognosis:  excellent     Meredith Staggers, MD, [facility] 06/14/2015

## 2015-06-15 ENCOUNTER — Inpatient Hospital Stay (HOSPITAL_COMMUNITY): Payer: Medicare Other | Admitting: Occupational Therapy

## 2015-06-15 ENCOUNTER — Inpatient Hospital Stay (HOSPITAL_COMMUNITY): Payer: Medicare Other | Admitting: Physical Therapy

## 2015-06-15 DIAGNOSIS — I5032 Chronic diastolic (congestive) heart failure: Secondary | ICD-10-CM

## 2015-06-15 DIAGNOSIS — N183 Chronic kidney disease, stage 3 (moderate): Secondary | ICD-10-CM

## 2015-06-15 DIAGNOSIS — D62 Acute posthemorrhagic anemia: Secondary | ICD-10-CM

## 2015-06-15 DIAGNOSIS — R5381 Other malaise: Principal | ICD-10-CM

## 2015-06-15 LAB — GLUCOSE, CAPILLARY
GLUCOSE-CAPILLARY: 129 mg/dL — AB (ref 65–99)
GLUCOSE-CAPILLARY: 164 mg/dL — AB (ref 65–99)
Glucose-Capillary: 139 mg/dL — ABNORMAL HIGH (ref 65–99)

## 2015-06-15 MED ORDER — SODIUM CHLORIDE 0.9% FLUSH
10.0000 mL | INTRAVENOUS | Status: DC | PRN
  Administered 2015-06-17 – 2015-06-19 (×2): 20 mL
  Administered 2015-06-20: 10 mL
  Administered 2015-06-22 – 2015-06-24 (×2): 20 mL
  Filled 2015-06-15 (×5): qty 40

## 2015-06-15 MED ORDER — POTASSIUM CHLORIDE CRYS ER 20 MEQ PO TBCR
20.0000 meq | EXTENDED_RELEASE_TABLET | Freq: Two times a day (BID) | ORAL | Status: AC
  Administered 2015-06-15 (×2): 20 meq via ORAL
  Filled 2015-06-15 (×2): qty 1

## 2015-06-15 MED ORDER — ZOLPIDEM TARTRATE 5 MG PO TABS
2.5000 mg | ORAL_TABLET | Freq: Every day | ORAL | Status: DC
  Administered 2015-06-15 – 2015-06-25 (×12): 2.5 mg via ORAL
  Filled 2015-06-15 (×12): qty 1

## 2015-06-15 MED ORDER — TRAMADOL HCL 50 MG PO TABS
50.0000 mg | ORAL_TABLET | Freq: Four times a day (QID) | ORAL | Status: DC | PRN
  Administered 2015-06-15: 50 mg via ORAL
  Filled 2015-06-15: qty 1

## 2015-06-15 MED ORDER — SODIUM CHLORIDE 0.9% FLUSH
10.0000 mL | Freq: Two times a day (BID) | INTRAVENOUS | Status: DC
  Administered 2015-06-15 – 2015-06-25 (×4): 10 mL

## 2015-06-15 MED ORDER — HYDROCODONE-ACETAMINOPHEN 5-325 MG PO TABS
1.0000 | ORAL_TABLET | Freq: Four times a day (QID) | ORAL | Status: DC | PRN
  Administered 2015-06-15 – 2015-06-17 (×3): 1 via ORAL
  Filled 2015-06-15 (×4): qty 1

## 2015-06-15 NOTE — Consult Note (Signed)
WOC following patient inpatient, will continue to follow in rehab.  WOC will plan to see patient again on Monday May 22.   WOC will follow along with you for continued support with ostomy teaching and care [facility] RN,CWOCN [id number]

## 2015-06-15 NOTE — Evaluation (Signed)
Physical Therapy Assessment and Plan  Patient Details  Name: [name] MRN: [id number] Date of Birth: [birth year]  PT Diagnosis: Difficulty walking, Edema and Muscle weakness Rehab Potential: Good ELOS: 14-18 days   Today's Date: 06/15/2015 PT Individual Time: 1000-1100 1300-1345 PT Individual Time Calculation (min): 60 min   And 45 min   Problem List:  Patient Active Problem List   Diagnosis Date Noted  . Debilitated 06/14/2015  . History of creation of ostomy 06/14/2015  . Abdominal pain   . Chronic diastolic congestive heart failure ([place])   . History of CVA with residual deficit   . Respiratory depression   . Stool culture positive for Clostridium difficile   . On total parenteral nutrition (TPN)   . Urinary retention   . Leukocytosis   . Tachycardia   . Acute blood loss anemia   . AKI (acute kidney injury) ([place])   . Status post partial colectomy   . Ileus, postoperative   . Postoperative pain   . Diverticulitis of large intestine with perforation and abscess without bleeding   . CKD (chronic kidney disease) stage 3, GFR 30-59 ml/min 05/26/2015  . HOH (hard of hearing) 05/26/2015  . Protein-calorie malnutrition, moderate ([place]) 05/26/2015  . Diverticulitis of intestine with perforation and abscess 05/25/2015  . Diverticulosis   . Chronic thoracic back pain   . Acute on chronic diastolic heart failure ([place])   . Perforation of intestine due to diverticulitis of gastrointestinal tract ([place])   . SOB (shortness of breath) 05/01/2015  . Supratherapeutic INR   . Paroxysmal atrial fibrillation (H[facility])   . Essential hypertension, benign   . Left sided abdominal pain   . Diverticulitis 03/08/2015  . Obesity   . Chronic atrial fibrillation ([place])   . Upper airway cough syndrome   . Dyspnea 05/22/2014  . Tachycardia-bradycardia syndrome with symptomatic bradycardia 01/03/2014  . History of CVA (cerebrovascular accident) 10/25/2013  . Diabetes ([place]) 08/03/2013  .  Myofacial muscle pain 10/16/2011  . Back pain, Left Flank 07/01/2011  . Cerebral embolism with cerebral infarction ([place]) 06/12/2011  . CVA (cerebral infarction) 05/20/2011  . Weakness of left side of body 05/16/2011  . Elevated digoxin level 05/16/2011  . Pacemaker 05/21/2010  . Edema 04/24/2010  . Pericardial effusion, Large   . Chronic anticoagulation - Coumadin, CHADS2VASC=7   . HTN (hypertension)     Past Medical History:  Past Medical History  Diagnosis Date  . Pericardial effusion     a. s/p pericardial window 01/16/15  . History of GI bleed     a. secondary to AVM's. Treated with Fe infusion  . HTN (hypertension)   . Obesity   . LVH (left ventricular hypertrophy)   . Sleep apnea     mild-no cpap  . Positive TB test   . Anemia   . Stroke [facility])     a. 2013: right frontal  . Arthritis   . Tachycardia-bradycardia syndrome [facility])     a. s/p Medtronic [place], model number [id number], serial number [id number] H 12/2013  . History of shingles   . Chronic diastolic (congestive) heart failure ([place])     a. Echo 01/2015 with EF of 55-60%.  . Chronic atrial fibrillation (HCC)     a. on Coumadin, Digoxin, and Verapamil   Past Surgical History:  Past Surgical History  Procedure Laterality Date  . Breast lumpectomy Bilateral     negative for cancer  . Esophagogastroduodenoscopy  05/19/2011    Procedure: ESOPHAGOGASTRODUODENOSCOPY (EGD);  Surgeon:  Lear Ng, MD;  Location: [facility] ENDOSCOPY;  Service: Endoscopy;  Laterality: N/A;  doctor aware of inr   will try to be here no later than 230  . Cholecystectomy  2005  . Esophagogastroduodenoscopy  06/22/2011    Procedure: ESOPHAGOGASTRODUODENOSCOPY (EGD);  Surgeon: Arta Silence, MD;  Location: [facility] ENDOSCOPY;  Service: Endoscopy;  Laterality: N/A;  Check PT/INR in am  . Givens capsule study  06/23/2011    Procedure: GIVENS CAPSULE STUDY;  Surgeon: Arta Silence, MD;  Location: [facility] ENDOSCOPY;  Service: Endoscopy;  Laterality: N/A;  .  Colonoscopy  08/11/2011    Procedure: COLONOSCOPY;  Surgeon: Jeryl Columbia, MD;  Location: [facility] ENDOSCOPY;  Service: Endoscopy;  Laterality: N/A;  . Hot hemostasis  08/11/2011    Procedure: HOT HEMOSTASIS (ARGON PLASMA COAGULATION/BICAP);  Surgeon: Jeryl Columbia, MD;  Location: [facility] ENDOSCOPY;  Service: Endoscopy;  Laterality: N/A;  . Mass excision Left 09/14/2013    Procedure: EXCISION MASS LEFT WRIST;  Surgeon: Leanora Cover, MD;  Location: [place];  Service: Orthopedics;  Laterality: Left;  . Tonsillectomy and adenoidectomy  1950  . Insert / replace / remove pacemaker  2001    Last generator in 2006; interrogated Dec 2012  . Cataract extraction w/ intraocular lens  implant, bilateral Bilateral   . Lipoma excision Left 07/2013    wrist  . Pacemaker generator change N/A 01/02/2014    Procedure: PACEMAKER GENERATOR CHANGE;  Surgeon: Sanda Klein, MD;  Location: [place] CATH LAB;  Service: Cardiovascular;  Laterality: N/A;  . Lead revision N/A 01/02/2014    Procedure: LEAD REVISION;  Surgeon: Sanda Klein, MD;  Location: [place] CATH LAB;  Service: Cardiovascular;  Laterality: N/A;  . Subxyphoid pericardial window N/A 01/16/2015    Procedure: SUBXYPHOID PERICARDIAL WINDOW;  Surgeon: Grace Isaac, MD;  Location: [place];  Service: Thoracic;  Laterality: N/A;  . Tee without cardioversion N/A 01/16/2015    Procedure: TRANSESOPHAGEAL ECHOCARDIOGRAM (TEE);  Surgeon: Grace Isaac, MD;  Location: [place];  Service: Thoracic;  Laterality: N/A;  . Colon resection N/A 05/31/2015    Procedure: SIGMOID COLECTOMY;  Surgeon: Donnie Mesa, MD;  Location: [place];  Service: General;  Laterality: N/A;  . Colostomy N/A 05/31/2015    Procedure: DESCENDING COLOSTOMY;  Surgeon: Donnie Mesa, MD;  Location: [place];  Service: General;  Laterality: N/A;  . Bowel resection N/A 05/31/2015    Procedure: SMALL BOWEL RESECTION;  Surgeon: Donnie Mesa, MD;  Location: [place];  Service: General;  Laterality: N/A;  .  Debridement of abdominal wall abscess N/A 05/31/2015    Procedure: DRAINAGE OF PELVIC ABSCESS;  Surgeon: Donnie Mesa, MD;  Location: [place];  Service: General;  Laterality: N/A;    Assessment & Plan Clinical Impression: Patient is a 82 y.o. 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 Patient lives alone independent prior to admission using furniture at times for balance. Her daughter lives in the area and checks on her as needed. She has another daughter whom she may stay with, if needed. Presented 05/25/2015 of left lower quadrant abdominal pain. INR on admission of 3.01. CT of the abdomen showed diverticulitis involving the sigmoid colon with previously noted extraluminal collection of gas worrisome for abscess with signs of perforation. No change with conservative care and  underwent sigmoid colectomy, descending colostomy, small bowel resection with placement of pelvic abscess drain 05/31/2015 per Dr.Tsuei. Hospital course pain management. C. difficile colitis on contact precautions. TPN initiated for nutritional supportAnd diet slowly advanced. Postop urinary retention advisement to continue Foley tube for 7 days postoperatively. Intravenous heparin Initiated ongoing and Coumadin Resumed 06/06/2015. Leukocytosis 28,300 Placed on intravenous Zosyn/vancomycin for recurrent diverticulitis/duration until 06/14/2015 then at the end of 2 weeks prolonged taper of by mouth vancomycin. White blood cell count has improved to 9.6. Patient developed coffee-ground output from her ostomy 06/06/2015 with hemoglobin 8.5-9.0 with latest hemoglobin 7.4 05/16/2017And transfused with hemoglobin 9.5 . KUB of  the abdomen showed small bowel dilatation with air-fluid levels consistent with small bowel obstruction versus ileus. Her diet was downgraded to liquid and slowly advanced to regular consistency and tolerating well.  Patient transferred to CIR on 06/14/2015 .   Patient currently requires mod with mobility secondary to muscle weakness, decreased cardiorespiratoy endurance and decreased standing balance and decreased balance strategies.  Prior to hospitalization, patient was modified independent  with mobility and lived with Alone in a House home.  Home access is 2Stairs to enter.  Patient will benefit from skilled PT intervention to maximize safe functional mobility, minimize fall risk and decrease caregiver burden for planned discharge home with 24 hour supervision.  Anticipate patient will benefit from follow up HH at discharge.  PT - End of Session Activity Tolerance: Tolerates 10 - 20 min activity with multiple rests Endurance Deficit: Yes PT Assessment Rehab Potential (ACUTE/IP ONLY): Good Barriers to Discharge: Inaccessible home environment;Decreased caregiver support PT Patient demonstrates impairments in the following area(s): Balance;Endurance;Motor;Pain;Perception;Safety PT Transfers Functional Problem(s): Bed to Chair;Car;Bed Mobility;Furniture;Floor PT Locomotion Functional Problem(s): Ambulation;Wheelchair Mobility;Stairs PT Plan PT Intensity: Minimum of 1-2 x/day ,45 to 90 minutes PT Frequency: 5 out of 7 days PT Duration Estimated Length of Stay: 14-18 days PT Treatment/Interventions: Ambulation/gait training;Balance/vestibular training;Cognitive remediation/compensation;Community reintegration;Discharge planning;Disease management/prevention;DME/adaptive equipment instruction;Functional mobility training;Neuromuscular re-education;Pain management;Patient/family education;Skin care/wound management;Stair training;Psychosocial support;Therapeutic Activities;Therapeutic Exercise;UE/LE  Strength taining/ROM;UE/LE Coordination activities;Visual/perceptual remediation/compensation;Wheelchair propulsion/positioning PT Transfers Anticipated Outcome(s): supervision- mod I  PT Locomotion Anticipated Outcome(s): Supervision-mod I with with LRAd  PT Recommendation Follow Up Recommendations: Home health PT Patient destination: Home Equipment Recommended: Wheelchair (measurements);Wheelchair cushion (measurements);Rolling walker with 5" wheels  Skilled Therapeutic Intervention Session 1 PT performed eval and initiated treatment intervention. Car transfer training with stand pivot technique, Mod A from PT and heavy cues for UE positioning and gait pattern to improve safety of transfer. Stair training x 3 steps, 4 inches each with BLE support, min A-mod A from PT as well as mod cues for step to gait pattern and proper UE positioning. Stand pivot Transfer training with out AD and mod A from PT and cues for UE placement and improved control of descent. Stand pivot transfer x 2 with RW with min-modA and mod cues for UE placement for improved safety of transfers. Patient left sitting EOB with bed alarm on and lap table present for breakfast.   Session 2  Patient received sitting EOB with daughters present. Patient performed stand pivot transfer to [facility] with mod A from PT and min cues for UE placement. PT transported patient to gym with total A for time management. PT instructed patient in TUG with RW 72 seconds; >14 seconds indicates increased fall risk. PT also instructed patient in 5xSTS: 51 seconds with BUE support; >15 seconds indicated fall risk.    Gait training on uneven surface for 10 ft with RW  and mod A from PT to prevent posterior LOB. PT also provided mod cues to maintain RW close to COM to improve stability or RW.   Patient transferred to room with total A for time management. Transfer to [employer] with mod A and RW min A while standing to doff/don pants and brief; patient able to perform  self hygiene with set up. [facility] with mod A from PT and RW with min cue for  UE placement Sit>supine with mod A from PT For BLE and cues for improved sequencing to allow increased ease of transfer.     PT Evaluation Precautions/Restrictions Precautions Precautions: Fall Precaution Comments: IV lines and colostomy bag.   Restrictions Weight Bearing Restrictions: No General   Vital Signs Pain Pain Assessment Pain Assessment: No/denies pain Pain Score: 0-No pain Home Living/Prior Functioning Home Living Available Help at Discharge: Family;Friend(s) Type of Home: House Home Access: Stairs to enter [employer] of Steps: 2 Entrance Stairs-Rails: Left Home Layout: Two level Alternate Level Stairs-Rails: Left Bathroom Shower/Tub: Chiropodist: Standard Bathroom Accessibility: Yes Additional Comments: patient may D/c to daughter's 1 level home with 3 steps to enter and no rail.   Lives With: Alone Prior Function Level of Independence: Independent with basic ADLs;Independent with homemaking with ambulation;Independent with gait  Able to Take Stairs?: Yes Driving: Yes Vocation: Retired Tax adviser Overall Cognitive Status: Within Functional Limits for tasks assessed Orientation Level: Oriented X4 Memory: Appears intact Awareness: Appears intact Problem Solving: Impaired Problem Solving Impairment: Functional complex Safety/Judgment: Impaired Sensation Sensation Light Touch: Appears Intact Proprioception: Appears Intact Coordination Gross Motor Movements are Fluid and Coordinated: Yes Fine Motor Movements are Fluid and Coordinated: Yes Finger Nose Finger Test: decreased speed of movement  Motor  Motor Motor: Within Functional Limits;Hemiplegia Motor - Skilled Clinical Observations: generalized weakness mild hemiplegia in LLE due to prior CVA in 2013  Mobility Bed Mobility Bed Mobility: Rolling  Right;Rolling Left;Supine to Sit;Sit to Supine Rolling Right: 3: Mod assist Rolling Right Details: Verbal cues for sequencing;Verbal cues for technique;Verbal cues for precautions/safety;Tactile cues for placement Rolling Left: 4: Min assist Rolling Left Details: Verbal cues for sequencing;Verbal cues for technique;Verbal cues for precautions/safety;Tactile cues for placement Supine to Sit: 3: Mod assist Supine to Sit Details: Verbal cues for sequencing;Verbal cues for technique;Verbal cues for precautions/safety;Tactile cues for placement Sit to Supine: 3: Mod assist Sit to Supine - Details: Verbal cues for sequencing;Verbal cues for technique;Verbal cues for precautions/safety;Tactile cues for sequencing Transfers Transfers: Yes Sit to Stand: 3: Mod assist Sit to Stand Details: Verbal cues for sequencing;Verbal cues for technique;Verbal cues for precautions/safety;Tactile cues for weight shifting Stand to Sit: 3: Mod assist Stand to Sit Details (indicate cue type and reason): Verbal cues for sequencing;Verbal cues for technique;Verbal cues for precautions/safety;Tactile cues for placement Stand Pivot Transfers: 3: Mod assist Stand Pivot Transfer Details: Tactile cues for weight shifting;Tactile cues for placement;Verbal cues for sequencing;Verbal cues for technique;Verbal cues for precautions/safety;Verbal cues for gait pattern;Verbal cues for safe use of DME/AE Locomotion  Ambulation Ambulation: Yes Ambulation/Gait Assistance: 4: Min assist Ambulation Distance (Feet): 20 Feet Assistive device: Rolling walker Ambulation/Gait Assistance Details: Verbal cues for technique;Verbal cues for precautions/safety;Verbal cues for gait pattern;Verbal cues for safe use of DME/AE;Tactile cues for weight shifting Gait Gait: Yes Gait Pattern: Impaired Gait Pattern: Step-to pattern;Step-through pattern;Decreased step length - left;Decreased step length - right;Poor foot clearance - left;Left  steppage Gait velocity: decreased for age.  Stairs /  Additional Locomotion Stairs: Yes Stairs Assistance: 4: Min assist Stair Management Technique: Two rails Number of Stairs: 3 Height of Stairs: 4 Wheelchair Mobility Wheelchair Mobility: Yes Wheelchair Assistance: 4: Min Lexicographer: Both upper extremities Wheelchair Parts Management: Needs assistance Distance: 52f  Trunk/Postural Assessment  Cervical Assessment Cervical Assessment: Within Functional Limits Thoracic Assessment Thoracic Assessment: Within Functional Limits Lumbar Assessment Lumbar Assessment: Within Functional Limits Postural Control Postural Control: Within Functional Limits  Balance Balance Balance Assessed: Yes Static Sitting Balance Static Sitting - Level of Assistance: 6: Modified independent (Device/Increase time) Dynamic Sitting Balance Dynamic Sitting - Level of Assistance: 5: Stand by assistance Dynamic Sitting - Balance Activities: Reaching for objects;Reaching across midline Static Standing Balance Static Standing - Balance Support: No upper extremity supported Static Standing - Level of Assistance: 4: Min assist Dynamic Standing Balance Dynamic Standing - Balance Support: No upper extremity supported Dynamic Standing - Level of Assistance: 3: Mod assist Dynamic Standing - Balance Activities: Forward lean/weight shifting Dynamic Standing - Comments: Mod A with functional tasks including reaching for object and preparing for transfers.  Extremity Assessment      RLE Assessment RLE Assessment: Within Functional Limits LLE Assessment LLE Assessment: Exceptions to WFL LLE AROM (degrees) LLE Overall AROM Comments: lacking approx 25% hip flexion  LLE Strength LLE Overall Strength Comments: hip 4-/5. knee flexion 4/5 extension 4+/5 4+/5 ankle PF/DF   See Function Navigator for Current Functional Status.   Refer to Care Plan for Long Term Goals  Recommendations for other  services: None  Discharge Criteria: Patient will be discharged from PT if patient refuses treatment 3 consecutive times without medical reason, if treatment goals not met, if there is a change in medical status, if patient makes no progress towards goals or if patient is discharged from hospital.  The above assessment, treatment plan, treatment alternatives and goals were discussed and mutually agreed upon: by patient and by family  ALorie Phenix5/20/2017, 12:57 PM

## 2015-06-15 NOTE — Progress Notes (Signed)
At 230 pt had c/o insomnia, pt reported she usually takes Ambien at bedtime. MD notified, orders received. Ambien 2.5mg  given at 255. Will monitor for effectiveness. Ladashia Demarinis, Dione Plover

## 2015-06-15 NOTE — Evaluation (Signed)
Occupational Therapy Assessment and Plan  Patient Details  Name: [name] MRN: [id number] Date of Birth: August 24, 1923  OT Diagnosis: acute pain, muscle weakness (generalized), pain in joint and pain in thoracic spine Rehab Potential: Rehab Potential (ACUTE ONLY): Good ELOS: 2 to 2.5 weeks   Today's Date: 06/15/2015 OT Individual Time:  - 0730-0900   (90 min)       Problem List:  Patient Active Problem List   Diagnosis Date Noted  . Debilitated 06/14/2015  . History of creation of ostomy 06/14/2015  . Abdominal pain   . Chronic diastolic congestive heart failure ([place])   . History of CVA with residual deficit   . Respiratory depression   . Stool culture positive for Clostridium difficile   . On total parenteral nutrition (TPN)   . Urinary retention   . Leukocytosis   . Tachycardia   . Acute blood loss anemia   . AKI (acute kidney injury) ([place])   . Status post partial colectomy   . Ileus, postoperative   . Postoperative pain   . Diverticulitis of large intestine with perforation and abscess without bleeding   . CKD (chronic kidney disease) stage 3, GFR 30-59 ml/min 05/26/2015  . HOH (hard of hearing) 05/26/2015  . Protein-calorie malnutrition, moderate ([place]) 05/26/2015  . Diverticulitis of intestine with perforation and abscess 05/25/2015  . Diverticulosis   . Chronic thoracic back pain   . Acute on chronic diastolic heart failure ([place])   . Perforation of intestine due to diverticulitis of gastrointestinal tract ([place])   . SOB (shortness of breath) 05/01/2015  . Supratherapeutic INR   . Paroxysmal atrial fibrillation (HCC)   . Essential hypertension, benign   . Left sided abdominal pain   . Diverticulitis 03/08/2015  . Obesity   . Chronic atrial fibrillation ([place])   . Upper airway cough syndrome   . Dyspnea 05/22/2014  . Tachycardia-bradycardia syndrome with symptomatic bradycardia 01/03/2014  . History of CVA (cerebrovascular accident) 10/25/2013  . Diabetes  ([place]) 08/03/2013  . Myofacial muscle pain 10/16/2011  . Back pain, Left Flank 07/01/2011  . Cerebral embolism with cerebral infarction ([place]) 06/12/2011  . CVA (cerebral infarction) 05/20/2011  . Weakness of left side of body 05/16/2011  . Elevated digoxin level 05/16/2011  . Pacemaker 05/21/2010  . Edema 04/24/2010  . Pericardial effusion, Large   . Chronic anticoagulation - Coumadin, CHADS2VASC=7   . HTN (hypertension)     Past Medical History:  Past Medical History  Diagnosis Date  . Pericardial effusion     a. s/p pericardial window 01/16/15  . History of GI bleed     a. secondary to AVM's. Treated with Fe infusion  . HTN (hypertension)   . Obesity   . LVH (left ventricular hypertrophy)   . Sleep apnea     mild-no cpap  . Positive TB test   . Anemia   . Stroke [facility])     a. 2013: right frontal  . Arthritis   . Tachycardia-bradycardia syndrome [facility])     a. s/p Medtronic [place], model number [id number], serial number [id number] 12/2013  . History of shingles   . Chronic diastolic (congestive) heart failure ([place])     a. Echo 01/2015 with EF of 55-60%.  . Chronic atrial fibrillation (HCC)     a. on Coumadin, Digoxin, and Verapamil   Past Surgical History:  Past Surgical History  Procedure Laterality Date  . Breast lumpectomy Bilateral     negative for cancer  . Esophagogastroduodenoscopy  05/19/2011    Procedure: ESOPHAGOGASTRODUODENOSCOPY (EGD);  Surgeon: Shirley Friar, MD;  Location: [facility] ENDOSCOPY;  Service: Endoscopy;  Laterality: N/A;  doctor aware of inr   will try to be here no later than 230  . Cholecystectomy  2005  . Esophagogastroduodenoscopy  06/22/2011    Procedure: ESOPHAGOGASTRODUODENOSCOPY (EGD);  Surgeon: Willis Modena, MD;  Location: [facility] ENDOSCOPY;  Service: Endoscopy;  Laterality: N/A;  Check PT/INR in am  . Givens capsule study  06/23/2011    Procedure: GIVENS CAPSULE STUDY;  Surgeon: Willis Modena, MD;  Location: [facility] ENDOSCOPY;  Service: Endoscopy;   Laterality: N/A;  . Colonoscopy  08/11/2011    Procedure: COLONOSCOPY;  Surgeon: Petra Kuba, MD;  Location: [facility] ENDOSCOPY;  Service: Endoscopy;  Laterality: N/A;  . Hot hemostasis  08/11/2011    Procedure: HOT HEMOSTASIS (ARGON PLASMA COAGULATION/BICAP);  Surgeon: Petra Kuba, MD;  Location: [facility] ENDOSCOPY;  Service: Endoscopy;  Laterality: N/A;  . Mass excision Left 09/14/2013    Procedure: EXCISION MASS LEFT WRIST;  Surgeon: Betha Loa, MD;  Location: [facility];  Service: Orthopedics;  Laterality: Left;  . Tonsillectomy and adenoidectomy  1950  . Insert / replace / remove pacemaker  2001    Last generator in 2006; interrogated Dec 2012  . Cataract extraction w/ intraocular lens  implant, bilateral Bilateral   . Lipoma excision Left 07/2013    wrist  . Pacemaker generator change N/A 01/02/2014    Procedure: PACEMAKER GENERATOR CHANGE;  Surgeon: Thurmon Fair, MD;  Location: [facility] CATH LAB;  Service: Cardiovascular;  Laterality: N/A;  . Lead revision N/A 01/02/2014    Procedure: LEAD REVISION;  Surgeon: Thurmon Fair, MD;  Location: [facility] CATH LAB;  Service: Cardiovascular;  Laterality: N/A;  . Subxyphoid pericardial window N/A 01/16/2015    Procedure: SUBXYPHOID PERICARDIAL WINDOW;  Surgeon: Delight Ovens, MD;  Location: [facility] OR;  Service: Thoracic;  Laterality: N/A;  . Tee without cardioversion N/A 01/16/2015    Procedure: TRANSESOPHAGEAL ECHOCARDIOGRAM (TEE);  Surgeon: Delight Ovens, MD;  Location: [facility] OR;  Service: Thoracic;  Laterality: N/A;  . Colon resection N/A 05/31/2015    Procedure: SIGMOID COLECTOMY;  Surgeon: Manus Rudd, MD;  Location: [facility] OR;  Service: General;  Laterality: N/A;  . Colostomy N/A 05/31/2015    Procedure: DESCENDING COLOSTOMY;  Surgeon: Manus Rudd, MD;  Location: [facility] OR;  Service: General;  Laterality: N/A;  . Bowel resection N/A 05/31/2015    Procedure: SMALL BOWEL RESECTION;  Surgeon: Manus Rudd, MD;  Location: [facility] OR;  Service: General;   Laterality: N/A;  . Debridement of abdominal wall abscess N/A 05/31/2015    Procedure: DRAINAGE OF PELVIC ABSCESS;  Surgeon: Manus Rudd, MD;  Location: [facility] OR;  Service: General;  Laterality: N/A;    Assessment & Plan Clinical ImpressionMargaret P [name] is a 82 y.o. 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 Patient lives alone independent prior to admission using furniture at times for balance. Her daughter lives in the area and checks on her as needed. She has another daughter whom she may stay with, if needed. Presented 05/25/2015 of left lower quadrant abdominal pain. INR on admission of 3.01. CT of the abdomen showed diverticulitis involving the sigmoid colon with previously noted extraluminal collection of gas worrisome for abscess  with signs of perforation. No change with conservative care and underwent sigmoid colectomy, descending colostomy, small bowel resection with placement of pelvic abscess drain 05/31/2015 per Dr.Tsuei. Hospital course pain management. C. difficile colitis on contact precautions. TPN initiated for nutritional supportAnd diet slowly advanced. Postop urinary retention advisement to continue Foley tube for 7 days postoperatively. Intravenous heparin Initiated ongoing and Coumadin Resumed 06/06/2015. Leukocytosis 28,300 Placed on intravenous Zosyn/vancomycin for recurrent diverticulitis/duration until 06/14/2015 then at the end of 2 weeks prolonged taper of by mouth vancomycin. White blood cell count has improved to 9.6. Patient developed coffee-ground output from her ostomy 06/06/2015 with hemoglobin 8.5-9.0 with latest hemoglobin 7.4 05/16/2017And transfused  with hemoglobin 9.5 . KUB of the abdomen showed small bowel dilatation with air-fluid levels consistent with small bowel obstruction versus ileus. Her diet was downgraded to liquid and slowly advanced to regular consistency and tolerating well. Physical therapy evaluation completed 06/02/2015 with  Patient transferred to CIR on 06/14/2015 .    Patient currently requires mod with basic self-care skills and IADL secondary to muscle weakness.  Prior to hospitalization, patient could complete BADL with modified independent .  Patient will benefit from skilled intervention to increase independence with basic self-care skills prior to discharge home with care partner.  Anticipate patient will require intermittent supervision and follow up home health.  OT - End of Session Endurance Deficit: Yes OT Assessment Rehab Potential (ACUTE ONLY): Good Barriers to Discharge:  (none) OT Patient demonstrates impairments in the following area(s): Balance;Endurance;Motor;Pain OT Basic ADL's Functional Problem(s): Grooming;Bathing;Dressing;Toileting OT Advanced ADL's Functional Problem(s): Simple Meal Preparation OT Transfers Functional Problem(s): Toilet;Tub/Shower OT Plan OT Intensity: Minimum of 1-2 x/day, 45 to 90 minutes OT Frequency: 5 out of 7 days OT Duration/Estimated Length of Stay: 2 to 2.5 weeks OT Treatment/Interventions: Balance/vestibular training;Discharge planning;DME/adaptive equipment instruction;Functional mobility training;Pain management;Neuromuscular re-education;Patient/family education;Self Care/advanced ADL retraining;Therapeutic Activities;Therapeutic Exercise;UE/LE Strength taining/ROM OT Self Feeding Anticipated Outcome(s): independent OT Basic Self-Care Anticipated Outcome(s): supervision OT Toileting Anticipated Outcome(s): supervision OT Bathroom Transfers Anticipated Outcome(s): supervision OT Recommendation Recommendations for Other Services:  (none) Patient destination:  Home Follow Up Recommendations: Home health OT Equipment Recommended: To be determined   Skilled Therapeutic Intervention Pt eager to start therapy.  Transferred to wc>toilet>wc>shower with mod assist.   Pt performed sit to stand and standing balance for shower with mod to min assist.  Addressed OT POC, balance, strength, ROM, endurance, mobility during session.    OT Evaluation Precautions/Restrictions  Precautions Precautions: Fall Precaution Comments: IV lines and colostomy bag.   Restrictions Weight Bearing Restrictions: No   Pain Pain Assessment Pain Assessment: 0-10 Pain Score: 4  Pain Type: Acute pain Pain Location: Back Pain Orientation: Lower Pain Descriptors / Indicators: Aching Pain Frequency: Constant Pain Onset: On-going Patients Stated Pain Goal: 2 Pain Intervention(s): Medication (See eMAR) Home Living/Prior Functioning Home Living Available Help at Discharge: Family, Friend(s) Type of Home: House Home Access: Stairs to enter Technical brewer of Steps: 2 Entrance Stairs-Rails: Left Home Layout: Two level Alternate Level Stairs-Number of Steps: 12 Alternate Level Stairs-Rails: Left Bathroom Shower/Tub: Chiropodist: Standard Bathroom Accessibility: Yes Additional Comments: patient may D/c to daughter's 1 level home with 3 steps to enter and no rail.   Lives With: Alone IADL History Homemaking Responsibilities: Yes Meal Prep Responsibility:  (children assist) IADL Comments: sleeps alot; sedentary Prior Function Level of Independence: Independent with basic ADLs, Independent with homemaking with ambulation, Independent with gait  Able to Take Stairs?: Yes Driving: Yes Vocation: Retired  ADL   Vision/Perception  Vision- History Baseline Vision/History: Wears glasses Wears Glasses: Reading only Vision- Assessment Vision Assessment?: No apparent visual deficits  Cognition Overall Cognitive Status: Within Functional Limits  for tasks assessed Arousal/Alertness: Awake/alert Orientation Level: Person;Place;Situation Person: Oriented Place: Oriented Situation: Oriented Year: 2017 Month: May Day of Week: Correct Memory: Appears intact Immediate Memory Recall: Sock;Blue;Bed Memory Recall: Sock;Blue;Bed Memory Recall Sock:  (unable) Memory Recall Blue: Without Cue Memory Recall Bed: With Cue Attention: Selective Selective Attention: Appears intact Awareness: Appears intact Problem Solving: Impaired Problem Solving Impairment: Functional complex Safety/Judgment: Impaired Sensation Sensation Light Touch: Appears Intact Proprioception: Appears Intact Coordination Gross Motor Movements are Fluid and Coordinated: Yes Fine Motor Movements are Fluid and Coordinated: Yes Finger Nose Finger Test: decreased speed of movement  Motor  Motor Motor: Within Functional Limits;Hemiplegia Motor - Skilled Clinical Observations: generalized weakness mild hemiplegia in LLE due to prior CVA in 2013 Mobility  Bed Mobility Bed Mobility: Rolling Right;Rolling Left;Supine to Sit;Sit to Supine Rolling Right: 3: Mod assist Rolling Left: 4: Min assist Supine to Sit: 3: Mod assist Sit to Supine: 3: Mod assist  Trunk/Postural Assessment  Cervical Assessment Cervical Assessment: Within Functional Limits Thoracic Assessment Thoracic Assessment: Within Functional Limits Lumbar Assessment Lumbar Assessment: Within Functional Limits Postural Control Postural Control: Within Functional Limits  Balance Balance Balance Assessed: Yes Static Sitting Balance Static Sitting - Level of Assistance: 6: Modified independent (Device/Increase time) Dynamic Sitting Balance Dynamic Sitting - Level of Assistance: 5: Stand by assistance Dynamic Sitting - Balance Activities: Reaching for objects;Reaching across midline Sitting balance - Comments: Very nice ability to scoot hips back toward center of bed while sitting EOB Static Standing  Balance Static Standing - Balance Support: No upper extremity supported Static Standing - Level of Assistance: 4: Min assist Dynamic Standing Balance Dynamic Standing - Balance Support: No upper extremity supported Dynamic Standing - Level of Assistance: 3: Mod assist Dynamic Standing - Balance Activities: Forward lean/weight shifting Extremity/Trunk Assessment RUE Assessment RUE Assessment: Within Functional Limits LUE Assessment LUE Assessment: Within Functional Limits   See Function Navigator for Current Functional Status.   Refer to Care Plan for Long Term Goals  Recommendations for other services: None  Discharge Criteria: Patient will be discharged from OT if patient refuses treatment 3 consecutive times without medical reason, if treatment goals not met, if there is a change in medical status, if patient makes no progress towards goals or if patient is discharged from hospital.  The above assessment, treatment plan, treatment alternatives and goals were discussed and mutually agreed upon: by patient and by family  Lisa Roca 06/15/2015, 6:39 PM

## 2015-06-15 NOTE — Progress Notes (Addendum)
Patient ID: [name], female   DOB: February 24, 1923, 82 y.o.   MRN: [id number]   06/15/15.  82 y/o admit for CIR with Debilitation secondary to perforated diverticulitis status post sigmoid colectomy, descending colostomy with small bowel resection placement of pelvic abscess drain 05/05/2017Complicated by GI bleed and ileus.  Doing well; requesting to advance diet to soft which was well tolerated on inpatient/medical service  Past Medical History  Diagnosis Date  . Pericardial effusion     a. s/p pericardial window 01/16/15  . History of GI bleed     a. secondary to AVM's. Treated with Fe infusion  . HTN (hypertension)   . Obesity   . LVH (left ventricular hypertrophy)   . Sleep apnea     mild-no cpap  . Positive TB test   . Anemia   . Stroke [facility])     a. 2013: right frontal  . Arthritis   . Tachycardia-bradycardia syndrome [facility])     a. s/p Medtronic [place], model number [id number], serial number [id number] 12/2013  . History of shingles   . Chronic diastolic (congestive) heart failure ([place])     a. Echo 01/2015 with EF of 55-60%.  . Chronic atrial fibrillation (HCC)     a. on Coumadin, Digoxin, and Verapamil   Patient Vitals for the past 24 hrs:  BP Temp Temp src Pulse Resp SpO2 Weight  06/15/15 0521 (!) 104/59 mmHg 97.9 F (36.6 C) Oral (!) 105 17 99 % 150 lb 12.8 oz (68.402 kg)  06/14/15 2141 115/61 mmHg - - 88 - - -   CBG (last 3)   Recent Labs  06/14/15 1710 06/14/15 2155 06/15/15 0712  GLUCAP 87 109* 139*      Physical Exam: Blood pressure 107/71, pulse 110, temperature 97.5 F (36.4 C), temperature source Oral, resp. rate 18, height 5\' 5"  (1.651 m), weight 73.846 kg (162 lb 12.8 oz), SpO2 92 %.    Physical Exam Constitutional: She appears well-developed and well-nourished.  HENT: dentition fair Head: Normocephalic and atraumatic.  Eyes: Conjunctivae and EOM are normal.  Neck: Normal range of motion. Neck supple. No thyromegaly present.   Cardiovascular:  Murmur. No rubs or gallops Irregularly irregular HR 100 Respiratory: Effort normal and breath sounds normal. No respiratory distress.  GI: Soft. Bowel sounds are normal.  Colostomy in place. Abdominal incision  Musculoskeletal: She exhibits trace  edema.  Neurological: She is alert.  A&O2  Motor: Bilateral upper extremities 5/5 proximal to distal Left lower extremity: Hip flexion, knee extension 3 -/5, ankle dorsi/plantar flexion were 5/5 Right lower extremity: Hip flexion 3-/5, knee extension 3/5, ankle dorsi/plantarflexion 5/5  Skin: Skin is warm and dry.  Psychiatric: She has a normal mood and affect. Her behavior is normal    Medical Problem List and Plan: 1. Debilitation secondary to perforated diverticulitis status post sigmoid colectomy, descending colostomy with small bowel resection placement of pelvic abscess drain 05/05/2017Complicated by GI bleed and ileus continue CIR 2. DVT Prophylaxis/Anticoagulation: Coumadin discontinued due to GI bleed.  3. Acute blood loss anemia/GI bleed. Follow-up CBC  4.  Skin/Wound Care: Routine skin checks/colostomy education and skin care -wounds clean/intact, ostomy is sealed 5. Fluids/Electrolytes/Nutrition: Routine I&O's with follow-up chemistries.  Potassium 2.4.   Will supplement ane recheck in am 6. ID. Continue vancomycin and Zosyn through 06/14/2015 and then PO taper with vancomycin as directed then began vancomycin 125 mg twice a day 1 week then 125 mg daily 1 week then 125 mg every other day for  2 weeks and stop -contact precautions 7. HTN.Verapamil 120 mg daily. 8. Atrial fibrillation. Tachybradycardia syndrome with pacemaker. Cardiac rate control. Coumadin discontinued due to GI bleed. 9. Chronic renal insufficiency. Baseline creatinine 1.68. Follow-up chemistries 10. Chronic diastolic congestive heart failure.Demadex 80 mg daily. Monitor for any signs of fluid  overload. Weigh patient daily 11. C. difficile positive. Contact precautions 12 . Diabetes mellitus. Diet controlled. Hemoglobin A1c 6.2. Sliding scale insulin. Check blood sugars before meals and at bedtime

## 2015-06-15 NOTE — Plan of Care (Signed)
Problem: RH BOWEL ELIMINATION Goal: RH STG MANAGE BOWEL WITH ASSISTANCE STG Manage Bowel with Assistance.  Outcome: Not Progressing Ostomy -total care

## 2015-06-16 DIAGNOSIS — I482 Chronic atrial fibrillation: Secondary | ICD-10-CM

## 2015-06-16 LAB — PROTIME-INR
INR: 1.15 (ref 0.00–1.49)
PROTHROMBIN TIME: 14.9 s (ref 11.6–15.2)

## 2015-06-16 LAB — GLUCOSE, CAPILLARY
GLUCOSE-CAPILLARY: 112 mg/dL — AB (ref 65–99)
GLUCOSE-CAPILLARY: 111 mg/dL — AB (ref 65–99)
GLUCOSE-CAPILLARY: 143 mg/dL — AB (ref 65–99)
Glucose-Capillary: 151 mg/dL — ABNORMAL HIGH (ref 65–99)

## 2015-06-16 LAB — BASIC METABOLIC PANEL
ANION GAP: 9 (ref 5–15)
BUN: 9 mg/dL (ref 6–20)
CALCIUM: 7.7 mg/dL — AB (ref 8.9–10.3)
CO2: 29 mmol/L (ref 22–32)
Chloride: 100 mmol/L — ABNORMAL LOW (ref 101–111)
Creatinine, Ser: 1.12 mg/dL — ABNORMAL HIGH (ref 0.44–1.00)
GFR, EST AFRICAN AMERICAN: 49 mL/min — AB (ref >60)
GFR, EST NON AFRICAN AMERICAN: 42 mL/min — AB (ref >60)
Glucose, Bld: 141 mg/dL — ABNORMAL HIGH (ref 65–99)
POTASSIUM: 2.9 mmol/L — AB (ref 3.5–5.1)
SODIUM: 138 mmol/L (ref 135–145)

## 2015-06-16 MED ORDER — POTASSIUM CHLORIDE CRYS ER 20 MEQ PO TBCR
40.0000 meq | EXTENDED_RELEASE_TABLET | Freq: Two times a day (BID) | ORAL | Status: AC
  Administered 2015-06-16 (×2): 40 meq via ORAL
  Filled 2015-06-16 (×2): qty 2

## 2015-06-16 NOTE — Progress Notes (Signed)
Patient ID: [name], female   DOB: [birth year], 82 y.o.   MRN: [id number]  Patient ID: [name], female   DOB: Sep 27, 1923, 83 y.o.   MRN: [id number]   06/16/15.  82 y/o admit for CIR with d ebilitation secondary to perforated diverticulitis status post sigmoid colectomy, descending colostomy with small bowel resection placement of pelvic abscess drain [id number] complicated by GI bleed and ileus.  Doing well; has done well on soft diet.  Patient had considerable low back pain yesterday that responded to local heat.  Patient states she is pain-free this morning  Past Medical History  Diagnosis Date  . Pericardial effusion     a. s/p pericardial window 01/16/15  . History of GI bleed     a. secondary to AVM's. Treated with Fe infusion  . HTN (hypertension)   . Obesity   . LVH (left ventricular hypertrophy)   . Sleep apnea     mild-no cpap  . Positive TB test   . Anemia   . Stroke [facility])     a. 2013: right frontal  . Arthritis   . Tachycardia-bradycardia syndrome [facility])     a. s/p Medtronic [place], model number [id number], serial number [id number] 12/2013  . History of shingles   . Chronic diastolic (congestive) heart failure ([place])     a. Echo 01/2015 with EF of 55-60%.  . Chronic atrial fibrillation (HCC)     a. on Coumadin, Digoxin, and Verapamil   Patient Vitals for the past 24 hrs:  BP Temp Temp src Pulse Resp SpO2 Weight  06/16/15 0437 114/71 mmHg 98.6 F (37 C) Oral 100 17 97 % 153 lb 6.4 oz (69.582 kg)  06/15/15 1414 104/72 mmHg 98 F (36.7 C) Oral (!) 101 18 98 % -   CBG (last 3)   Recent Labs  06/15/15 1646 06/15/15 2154 06/16/15 0700  GLUCAP 129* 164* 112*      Physical Exam: Blood pressure 107/71, pulse 110, temperature 97.5 F (36.4 C), temperature source Oral, resp. rate 18, height 5\' 5"  (1.651 m), weight 73.846 kg (162 lb 12.8 oz), SpO2 92 %.    Physical Exam Constitutional: She appears well-developed and well-nourished.  HENT: dentition  fair Head: Normocephalic and atraumatic.  Eyes: Conjunctivae and EOM are normal.  Neck: Normal range of motion. Neck supple. No thyromegaly present.  Cardiovascular:  Murmur. No rubs or gallops Irregularly irregular HR 100 Respiratory: Effort normal and breath sounds normal. No respiratory distress.  GI: Soft. Bowel sounds are normal.  Colostomy in place. Abdominal incision  Musculoskeletal: She exhibits trace  edema.  Neurological: She is alert.  A&O2  Motor: Bilateral upper extremities 5/5 proximal to distal Left lower extremity: Hip flexion, knee extension 3 -/5, ankle dorsi/plantar flexion were 5/5 Right lower extremity: Hip flexion 3-/5, knee extension 3/5, ankle dorsi/plantarflexion 5/5  Skin: Skin is warm and dry.  Psychiatric: She has a normal mood and affect. Her behavior is normal    Medical Problem List and Plan: 1. Debilitation secondary to perforated diverticulitis status post sigmoid colectomy, descending colostomy with small bowel resection placement of pelvic abscess drain 05/05/2017Complicated by GI bleed and ileus continue CIR 2. DVT Prophylaxis/Anticoagulation: Coumadin discontinued due to GI bleed.  3. Acute blood loss anemia/GI bleed. Follow-up CBC:  9.5/30.2  4.  Skin/Wound Care: Routine skin checks/colostomy education and skin care -wounds clean/intact, ostomy is sealed 5. Fluids/Electrolytes/Nutrition: Routine I&O's with follow-up chemistries.  Potassium 2.4 yesterday and only increase to 2.9 today;  Will  continue to supplement and  recheck in am 6. ID. Continue vancomycin and Zosyn through 06/14/2015 and then PO taper with vancomycin as directed then began vancomycin 125 mg twice a day 1 week then 125 mg daily 1 week then 125 mg every other day for 2 weeks and stop -contact precautions 7. HTN.Verapamil 120 mg daily. 8. Atrial fibrillation. Tachybradycardia syndrome with pacemaker. Cardiac rate control.  Coumadin discontinued due to GI bleed. 9. Chronic renal insufficiency. Baseline creatinine 1.68. Follow-up chemistries 10. Chronic diastolic congestive heart failure.Demadex 80 mg daily. Monitor for any signs of fluid overload. Weigh patient daily 11. C. difficile positive. Contact precautions 12 . Diabetes mellitus. Diet controlled. Hemoglobin A1c 6.2. Sliding scale insulin. Check blood sugars before meals and at bedtime

## 2015-06-16 NOTE — Progress Notes (Signed)
Late entry 06/15/15 1430 nursing Patient c/o lower  back pain 10/10 ; pt claims this is new for her; heating pad with patient but unable to help; MD notified. New orders noted.

## 2015-06-17 ENCOUNTER — Inpatient Hospital Stay (HOSPITAL_COMMUNITY): Payer: Medicare Other | Admitting: Physical Therapy

## 2015-06-17 ENCOUNTER — Inpatient Hospital Stay (HOSPITAL_COMMUNITY): Payer: Medicare Other | Admitting: Occupational Therapy

## 2015-06-17 LAB — CBC WITH DIFFERENTIAL/PLATELET
BASOS ABS: 0 10*3/uL (ref 0.0–0.1)
BASOS PCT: 0 %
EOS ABS: 0.2 10*3/uL (ref 0.0–0.7)
EOS PCT: 4 %
HEMATOCRIT: 28.1 % — AB (ref 36.0–46.0)
Hemoglobin: 8.7 g/dL — ABNORMAL LOW (ref 12.0–15.0)
Lymphocytes Relative: 15 %
Lymphs Abs: 0.9 10*3/uL (ref 0.7–4.0)
MCH: 26.9 pg (ref 26.0–34.0)
MCHC: 31 g/dL (ref 30.0–36.0)
MCV: 87 fL (ref 78.0–100.0)
MONO ABS: 0.4 10*3/uL (ref 0.1–1.0)
Monocytes Relative: 8 %
NEUTROS ABS: 4.2 10*3/uL (ref 1.7–7.7)
Neutrophils Relative %: 73 %
PLATELETS: 320 10*3/uL (ref 150–400)
RBC: 3.23 MIL/uL — ABNORMAL LOW (ref 3.87–5.11)
RDW: 18.4 % — AB (ref 11.5–15.5)
WBC: 5.7 10*3/uL (ref 4.0–10.5)

## 2015-06-17 LAB — COMPREHENSIVE METABOLIC PANEL
ALBUMIN: 1.3 g/dL — AB (ref 3.5–5.0)
ALK PHOS: 110 U/L (ref 38–126)
ALT: 16 U/L (ref 14–54)
AST: 22 U/L (ref 15–41)
Anion gap: 8 (ref 5–15)
BILIRUBIN TOTAL: 0.5 mg/dL (ref 0.3–1.2)
BUN: 8 mg/dL (ref 6–20)
CALCIUM: 7.8 mg/dL — AB (ref 8.9–10.3)
CO2: 29 mmol/L (ref 22–32)
Chloride: 100 mmol/L — ABNORMAL LOW (ref 101–111)
Creatinine, Ser: 1.05 mg/dL — ABNORMAL HIGH (ref 0.44–1.00)
GFR calc Af Amer: 53 mL/min — ABNORMAL LOW (ref >60)
GFR calc non Af Amer: 45 mL/min — ABNORMAL LOW (ref >60)
GLUCOSE: 107 mg/dL — AB (ref 65–99)
Potassium: 3 mmol/L — ABNORMAL LOW (ref 3.5–5.1)
Sodium: 137 mmol/L (ref 135–145)
TOTAL PROTEIN: 4.6 g/dL — AB (ref 6.5–8.1)

## 2015-06-17 LAB — GLUCOSE, CAPILLARY
GLUCOSE-CAPILLARY: 106 mg/dL — AB (ref 65–99)
GLUCOSE-CAPILLARY: 147 mg/dL — AB (ref 65–99)
Glucose-Capillary: 123 mg/dL — ABNORMAL HIGH (ref 65–99)
Glucose-Capillary: 98 mg/dL (ref 65–99)
Glucose-Capillary: 95 mg/dL (ref 65–99)

## 2015-06-17 LAB — PROTIME-INR
INR: 1.17 (ref 0.00–1.49)
Prothrombin Time: 15 seconds (ref 11.6–15.2)

## 2015-06-17 MED ORDER — WARFARIN SODIUM 2.5 MG PO TABS
2.5000 mg | ORAL_TABLET | Freq: Once | ORAL | Status: DC
  Filled 2015-06-17: qty 1

## 2015-06-17 MED ORDER — WARFARIN - PHARMACIST DOSING INPATIENT
Freq: Every day | Status: DC
Start: 2015-06-17 — End: 2015-06-26
  Administered 2015-06-22 – 2015-06-23 (×2)

## 2015-06-17 MED ORDER — DIGOXIN 125 MCG PO TABS
0.0625 mg | ORAL_TABLET | Freq: Every day | ORAL | Status: DC
  Administered 2015-06-17 – 2015-06-26 (×10): 0.0625 mg via ORAL
  Filled 2015-06-17 (×10): qty 1

## 2015-06-17 MED ORDER — VANCOMYCIN 50 MG/ML ORAL SOLUTION
125.0000 mg | ORAL | Status: DC
Start: 2015-07-01 — End: 2015-06-26

## 2015-06-17 MED ORDER — VANCOMYCIN 50 MG/ML ORAL SOLUTION
125.0000 mg | Freq: Every day | ORAL | Status: DC
  Administered 2015-06-24 – 2015-06-26 (×3): 125 mg via ORAL
  Filled 2015-06-17 (×3): qty 2.5

## 2015-06-17 MED ORDER — HYDROCODONE-ACETAMINOPHEN 5-325 MG PO TABS
1.0000 | ORAL_TABLET | ORAL | Status: DC | PRN
  Administered 2015-06-17 – 2015-06-18 (×3): 1 via ORAL
  Filled 2015-06-17 (×4): qty 1

## 2015-06-17 MED ORDER — TORSEMIDE 20 MG PO TABS
40.0000 mg | ORAL_TABLET | Freq: Every day | ORAL | Status: DC
  Administered 2015-06-17 – 2015-06-26 (×10): 40 mg via ORAL
  Filled 2015-06-17 (×10): qty 2

## 2015-06-17 MED ORDER — VANCOMYCIN 50 MG/ML ORAL SOLUTION
125.0000 mg | Freq: Two times a day (BID) | ORAL | Status: AC
  Administered 2015-06-17 – 2015-06-23 (×13): 125 mg via ORAL
  Filled 2015-06-17 (×14): qty 2.5

## 2015-06-17 NOTE — Progress Notes (Signed)
Pts coumadin resumed today. Pt voided on BSC and had a moderate amount of blood in bedpan. RN assess hemorrhoids, and found them to be bleeding. Blood was dripping on the floor when she stood. Family in the room and said this was the normal for her. Brief applied, and Marlowe Sh[place], PA notified. New orders for CBC in the AM and sitz bath if patient would like.  Will continue to monitor. Kennieth Francois, RN

## 2015-06-17 NOTE — Progress Notes (Signed)
ANTICOAGULATION CONSULT NOTE - Initial Consult  Pharmacy Consult for coumadin Indication: atrial fibrillation  Allergies  Allergen Reactions  . Anti-Inflammatory Enzyme [Nutritional Supplements] Other (See Comments)    Retains fluids and headaches  . Iodinated Diagnostic Agents Hives    HIVES 15MIN S/P IV CONTRAST INJECTION,WILL NEED 13 HR PREP FOR FUTURE INJECTIONS, ok s/p 50mg  po benadryl//a.calhoun  . Spironolactone Other (See Comments)    Hair loss  . Arthrotec [Diclofenac-Misoprostol] Other (See Comments)    unknown  . Biaxin [Clarithromycin] Other (See Comments)    unknown  . Plavix [Clopidogrel Bisulfate] Other (See Comments)    unknown    Patient Measurements: Weight: 151 lb 14.4 oz (68.9 kg)   Vital Signs: Temp: 98.6 F (37 C) (05/22 0512) Temp Source: Oral (05/22 0512) BP: 107/64 mmHg (05/22 0512) Pulse Rate: 106 (05/22 0512)  Labs:  Recent Labs  06/16/15 0409 06/17/15 0630  HGB  --  8.7*  HCT  --  28.1*  PLT  --  320  LABPROT 14.9 15.0  INR 1.15 1.17  CREATININE 1.12* 1.05*    Estimated Creatinine Clearance: 34.7 mL/min (by C-G formula based on Cr of 1.05).   Medical History: Past Medical History  Diagnosis Date  . Pericardial effusion     a. s/p pericardial window 01/16/15  . History of GI bleed     a. secondary to AVM's. Treated with Fe infusion  . HTN (hypertension)   . Obesity   . LVH (left ventricular hypertrophy)   . Sleep apnea     mild-no cpap  . Positive TB test   . Anemia   . Stroke [facility])     a. 2013: right frontal  . Arthritis   . Tachycardia-bradycardia syndrome [facility])     a. s/p Medtronic Saxton, model number [id number], serial number [id number] H 12/2013  . History of shingles   . Chronic diastolic (congestive) heart failure ([place])     a. Echo 01/2015 with EF of 55-60%.  . Chronic atrial fibrillation (HCC)     a. on Coumadin, Digoxin, and Verapamil    Assessment: [name] on coumadin PTA for afib and hx of stroke, presented  4/29 with diverticulitis, required colectomy/colostomy. noted coffee-ground output from her ostomy with dropping hgb on 06/06/2015, anticoagulation had been on hold since then. Now bleeding resolved, hgb stable at 8.7, will restart coumadin. INR 1.17  PTA: 2.5 mg daily except 5 mg T/Th/Su (might require lower dose now)  Goal of Therapy:  INR 2-3 Monitor platelets by anticoagulation protocol: Yes   Plan:  Coumadin 2.5 mg po x 1 Daily PT/INR.   FYI: Note prior to transfer says zosyn through 5/19, and to start taper on 5/19 as well, but currently still on zosyn and vanc QID. Wbc wnl. BM x 3. Please consider d/c zosyn and start po vanc taper.   Pt's K level still low despite multiple po/iv supplements, Mg 1.5, received 2g IV mgSO4 on 5/19, not rechecked yet. Consider rechecking Mg?  Maryanna Shape, [name], BCPS  Clinical Pharmacist  Pager: [phone]   06/17/2015,11:14 AM

## 2015-06-17 NOTE — Consult Note (Signed)
WOC by to check in on patient, pouch changed twice over the weekend. Patient up with therapy now. Will reassess in the am. Marica Otter RN,CWOCN [id number]

## 2015-06-17 NOTE — Progress Notes (Signed)
Occupational Therapy Session Note  Patient Details  Name: [name] MRN: [id number] Date of Birth: 1923/12/02  Today's Date: 06/17/2015 OT Individual Time: 1300-1330 OT Individual Time Calculation (min): 30 min    Short Term Goals: Week 1:  OT Short Term Goal 1 (Week 1): Pt will groom in standing at sink for  minutes with SBA OT Short Term Goal 2 (Week 1): Pt will bathe self with SBA OT Short Term Goal 3 (Week 1): Pt will dress self with Min assist OT Short Term Goal 4 (Week 1): Pt will transfer to toilet with min assist OT Short Term Goal 5 (Week 1): Pt will transfer to shower with min assist  Skilled Therapeutic Interventions/Progress Updates:    Pt seen for skilled OT to facilitate activity tolerance, balance and generalized strength. Pt sleeping in bed, but awoke easily. Agreeable to therapy. Pt worked on standing for approximately 2 min for 5 x with 2 min rest breaks between each stand. During standing,she worked on wt shifts, forward and side taps, heel lifts.  Pt ambulated to toilet and completed toileting and transfer with steadying A.  She needed slightly more A to stand from toilet. Pt returned to bed to rest with family in the room with pt.  Therapy Documentation Precautions:  Precautions Precautions: Fall Precaution Comments: IV lines and colostomy bag.   Restrictions Weight Bearing Restrictions: No    Vital Signs: Therapy Vitals Temp: 97.5 F (36.4 C) Temp Source: Oral Pulse Rate: 70 Resp: 18 BP: (!) 100/55 mmHg Patient Position (if appropriate): Lying Oxygen Therapy SpO2: 96 % O2 Device: Not Delivered   Pain: Pain Assessment Pain Assessment: No/denies pain   ADL:  See Function Navigator for Current Functional Status.   Therapy/Group: Individual Therapy  [place] 06/17/2015, 3:50 PM

## 2015-06-17 NOTE — Plan of Care (Signed)
Problem: RH SKIN INTEGRITY Goal: RH STG ABLE TO PERFORM INCISION/WOUND CARE W/ASSISTANCE STG Able To Perform Incision/Wound Care With total Assistance.  Outcome: Not Progressing Colostomy care- total assist at this time

## 2015-06-17 NOTE — IPOC Note (Addendum)
Overall Plan of Care [facility]) Patient Details Name: [name] MRN: [id number] DOB: 05-05-1923  Admitting Diagnosis: DEBILITY  Hospital Problems: Principal Problem:   Debilitated Active Problems:   Chronic atrial fibrillation (HCC)   CKD (chronic kidney disease) stage 3, GFR 30-59 ml/min   Diverticulitis of large intestine with perforation and abscess without bleeding   Chronic diastolic congestive heart failure (HCC)   Acute blood loss anemia   History of creation of ostomy     Functional Problem List: Nursing Behavior, Bladder, Bowel, Medication Management, Safety, Nutrition, Skin Integrity, Pain  PT Balance, Endurance, Motor, Pain, Perception, Safety  OT Balance, Endurance, Motor, Pain  SLP    TR         Basic ADL's: OT Grooming, Bathing, Dressing, Toileting     Advanced  ADL's: OT Simple Meal Preparation     Transfers: PT Bed to Chair, Car, Bed Mobility, Furniture, Floor  OT Toilet, Metallurgist: PT Ambulation, Emergency planning/management officer, Stairs     Additional Impairments: OT    SLP        TR      Anticipated Outcomes Item Anticipated Outcome  Self Feeding independent  Swallowing      Basic self-care  supervision  Toileting  supervision   Bathroom Transfers supervision  Bowel/Bladder  colostomy,min assist  Transfers  supervision- mod I   Locomotion  Supervision-mod I with with LRAd   Communication     Cognition     Pain  no pain  Safety/Judgment  supervision   Therapy Plan: PT Intensity: Minimum of 1-2 x/day ,45 to 90 minutes PT Frequency: 5 out of 7 days PT Duration Estimated Length of Stay: 14-18 days OT Intensity: Minimum of 1-2 x/day, 45 to 90 minutes OT Frequency: 5 out of 7 days OT Duration/Estimated Length of Stay: 2 to 2.5 weeks         Team Interventions: Nursing Interventions Patient/Family Education, Bladder Management, Bowel Management, Pain Management, Skin Care/Wound Management, Discharge Planning,  Medication Management, Disease Management/Prevention, Psychosocial Support  PT interventions Ambulation/gait training, Training and development officer, Cognitive remediation/compensation, Community reintegration, Discharge planning, Disease management/prevention, DME/adaptive equipment instruction, Functional mobility training, Neuromuscular re-education, Pain management, Patient/family education, Skin care/wound management, Stair training, Psychosocial support, Therapeutic Activities, Therapeutic Exercise, UE/LE Strength taining/ROM, UE/LE Coordination activities, Visual/perceptual remediation/compensation, Wheelchair propulsion/positioning  OT Interventions Training and development officer, Discharge planning, DME/adaptive equipment instruction, Functional mobility training, Pain management, Neuromuscular re-education, Patient/family education, Self Care/advanced ADL retraining, Therapeutic Activities, Therapeutic Exercise, UE/LE Strength taining/ROM  SLP Interventions    TR Interventions    SW/CM Interventions Discharge Planning, Psychosocial Support, Patient/Family Education    Team Discharge Planning: Destination: PT-Home ,OT- Home , SLP-  Projected Follow-up: PT-Home health PT, OT-  Home health OT, SLP-  Projected Equipment Needs: PT-Wheelchair (measurements), Wheelchair cushion (measurements), Rolling walker with 5" wheels, OT- To be determined, SLP-  Equipment Details: PT- , OT-  Patient/family involved in discharge planning: PT- Patient, Family member/caregiver,  OT-Patient, Family member/caregiver, SLP-   MD ELOS: 13-17d Medical Rehab Prognosis:  Good Assessment: 82 y.o. right handed female with history of diverticulitis, chronic diastolic congestive heart failure, diabetes mellitus, atrial fibrillation with tachybrady syndrome status post pacemaker with chronic Coumadin, hypertension, chronic renal insufficiency baseline creatinine 1.68, posterior right frontal lobe infarction April 2013 received  inpatient rehabilitation services. Patient lives alone independent prior to admission using furniture at times for balance. Her daughter lives in the area and checks on her as needed. She has another daughter whom she  may stay with, if needed. Presented 05/25/2015 of left lower quadrant abdominal pain. INR on admission of 3.01. CT of the abdomen showed diverticulitis involving the sigmoid colon with previously noted extraluminal collection of gas worrisome for abscess with signs of perforation. No change with conservative care and underwent sigmoid colectomy, descending colostomy, small bowel resection with placement of pelvic abscess drain 05/31/2015 per Dr.Tsuei. Hospital course pain management. C. difficile colitis on contact precautions. TPN initiated for nutritional supportAnd diet slowly advanced. Postop urinary retention advisement to continue Foley tube for 7 days postoperatively. Intravenous heparin Initiated ongoing and Coumadin Resumed 06/06/2015. Leukocytosis 28,300 Placed on intravenous Zosyn/vancomycin for recurrent diverticulitis/duration until 06/14/2015 then at the end of 2 weeks prolonged taper of by mouth vancomycin. White blood cell count has improved to 9.6. Patient developed coffee-ground output from her ostomy 06/06/2015 with hemoglobin 8.5-9.0 with latest hemoglobin 7.4 05/16/2017And transfused with hemoglobin 9.5 . KUB of the abdomen showed small bowel dilatation with air-fluid levels consistent with small bowel obstruction versus ileus. Her diet was downgraded to liquid and slowly advanced to regular consistency and tolerating well   Now requiring 24/7 Rehab RN,MD, as well as CIR level PT, OT .  Treatment team will focus on ADLs and mobility with goals set at Supervision  See Team Conference Notes for weekly updates to the plan of care

## 2015-06-17 NOTE — Progress Notes (Signed)
Social Work Patient ID: [name], female   DOB: 1923/02/04, 82 y.o.   MRN: [id number]   Attempted to meet with pt to complete assessment interview.  Pt sleeping but awoke briefly to ask that I come back tomorrow.  Reports very fatigued from full day of therapy.  Will follow up tomorrow.  Ty Oshima, LCSW

## 2015-06-17 NOTE — Progress Notes (Signed)
Patient information reviewed and entered into eRehab system by Daiva Nakayama, RN, CRRN, [place] Coordinator.  Information including medical coding and functional independence measure will be reviewed and updated through discharge.     Per nursing patient was given "Data Collection Information Summary for Patients in Inpatient Rehabilitation Facilities with attached "Privacy Act [facility] Records" upon admission.  Handout present in education notebook.

## 2015-06-17 NOTE — Progress Notes (Signed)
Occupational Therapy Session Note  Patient Details  Name: [name] MRN: [id number] Date of Birth: 24-Nov-1923  Today's Date: 06/17/2015 OT Individual Time: [id number] OT Individual Time Calculation (min): 111 min    Short Term Goals: Week 1:  OT Short Term Goal 1 (Week 1): Pt will groom in standing at sink for  minutes with SBA OT Short Term Goal 2 (Week 1): Pt will bathe self with SBA OT Short Term Goal 3 (Week 1): Pt will dress self with Min assist OT Short Term Goal 4 (Week 1): Pt will transfer to toilet with min assist OT Short Term Goal 5 (Week 1): Pt will transfer to shower with min assist  Skilled Therapeutic Interventions/Progress Updates:  Though patient was in bed napping upon approach, and though she stated she did not rest well and slept on and off last night, she participated in skilled OT as follows:  Bed to w/c stand pivot transfer=close S  Educated patient and dtr on how to burp and empty ostomy into container=total assist  Sit to stand at sink x6 to wash bottom, don, panties and don pants=CGA  upperbody bathing and dressing= setup with many rest breaks  Lower body bathing sit to stand=close S and extra time  Lower body dressing sit to stand=moderate assist (patient with lower extremity weakness, thus, difficulty raising bilateral legs)  Patient c/o of great fatigue and required rest breaks approximately every 6 minutes of the session  Patient was left sitting in her w/c waiting for lunch with her dtr and son-in-law in the room and with her call bell and phone in place     Therapy Documentation Precautions:  Precautions Precautions: Fall Precaution Comments: IV lines and colostomy bag.   Restrictions Weight Bearing Restrictions: No  Pain:3/4 superior to her abdomenal surgical site "dull constant aggravating pain".   RN gave meds    See Function Navigator for Current Functional Status.   Therapy/Group: Individual Therapy  Alfredia Ferguson  [facility] 06/17/2015, 12:23 PM

## 2015-06-17 NOTE — Progress Notes (Signed)
Pt refusing sitz bath at this time. Will continue to monitor. Kennieth Francois, RN

## 2015-06-17 NOTE — Progress Notes (Signed)
Physical Therapy Session Note  Patient Details  Name: [name] MRN: [id number] Date of Birth: [birth year]  Today's Date: 06/17/2015 PT Individual Time: 1415-1520 PT Individual Time Calculation (min): 65 min   Short Term Goals: Week 1:  PT Short Term Goal 1 (Week 1): Patient will performed bed mobility with min A from PT PT Short Term Goal 2 (Week 1): Patient will perform WC<>bed transfer with min A and LRAD PT Short Term Goal 3 (Week 1): Patient will perform gait for 24f with RW and min A from PT   Skilled Therapeutic Interventions/Progress Updates:    Pt received sleeping in bed but easily awakes to voice. No c/o pain and agreeable to therapy session.  Session focus on overall endurance, LE strength, gait training, and transfer training.    Pt transfers supine>sit with steady assist, sit>supine with mod assist for LEs, and sit<>stand from a variety of surfaces with supervision and RW throughout session.  Gait training x75' +65' with RW and close supervision for safety.  Extended rest breaks provided between trials.   PT instructed pt in BLE therex 2x12 reps with resistance (2# or level 2 theraband) for LAQ with 3 sec hold, hip flexion (no resistance), isometric hip adduction with ball, and hip abduction with theraband.  Pt performed 5:40 +1:40 minutes on nustep at level 1 focus on endurance, pacing, and pursed lip breathing.  Rest breaks provided throughout therex and nustep as needed.    Pt returned to w/c and declined final 10 minutes of therapy due to fatigue.  Pt returned to bed at end of session and positioned to comfort with call bell in reach and needs met.    Therapy Documentation Precautions:  Precautions Precautions: Fall Precaution Comments: IV lines and colostomy bag.   Restrictions Weight Bearing Restrictions: No   See Function Navigator for Current Functional Status.   Therapy/Group: Individual Therapy  CEarnest ConroyPenven-Crew 06/17/2015, 2:48 PM

## 2015-06-17 NOTE — Progress Notes (Signed)
Social Work  Social Work Assessment and Plan  Patient Details  Name: [name] MRN: [id number] Date of Birth: 17-May-1923  Today's Date: 06/18/2015  Problem List:  Patient Active Problem List   Diagnosis Date Noted  . Hypokalemia   . Hypomagnesemia   . Debilitated 06/14/2015  . History of creation of ostomy 06/14/2015  . Abdominal pain   . Chronic diastolic congestive heart failure ([place])   . History of CVA with residual deficit   . Respiratory depression   . Stool culture positive for Clostridium difficile   . On total parenteral nutrition (TPN)   . Urinary retention   . Leukocytosis   . Tachycardia   . Acute blood loss anemia   . AKI (acute kidney injury) ([place])   . Status post partial colectomy   . Ileus, postoperative   . Postoperative pain   . Diverticulitis of large intestine with perforation and abscess without bleeding   . CKD (chronic kidney disease) stage 3, GFR 30-59 ml/min 05/26/2015  . HOH (hard of hearing) 05/26/2015  . Protein-calorie malnutrition, moderate ([place]) 05/26/2015  . Diverticulitis of intestine with perforation and abscess 05/25/2015  . Diverticulosis   . Chronic thoracic back pain   . Acute on chronic diastolic heart failure ([place])   . Perforation of intestine due to diverticulitis of gastrointestinal tract ([place])   . SOB (shortness of breath) 05/01/2015  . Supratherapeutic INR   . Paroxysmal atrial fibrillation (HCC)   . Essential hypertension, benign   . Left sided abdominal pain   . Diverticulitis 03/08/2015  . Obesity   . Chronic atrial fibrillation ([place])   . Upper airway cough syndrome   . Dyspnea 05/22/2014  . Tachycardia-bradycardia syndrome with symptomatic bradycardia 01/03/2014  . History of CVA (cerebrovascular accident) 10/25/2013  . Diabetes ([place]) 08/03/2013  . Myofacial muscle pain 10/16/2011  . Back pain, Left Flank 07/01/2011  . Cerebral embolism with cerebral infarction ([place]) 06/12/2011  . CVA (cerebral infarction)  05/20/2011  . Weakness of left side of body 05/16/2011  . Elevated digoxin level 05/16/2011  . Pacemaker 05/21/2010  . Edema 04/24/2010  . Pericardial effusion, Large   . Chronic anticoagulation - Coumadin, CHADS2VASC=7   . HTN (hypertension)    Past Medical History:  Past Medical History  Diagnosis Date  . Pericardial effusion     a. s/p pericardial window 01/16/15  . History of GI bleed     a. secondary to AVM's. Treated with Fe infusion  . HTN (hypertension)   . Obesity   . LVH (left ventricular hypertrophy)   . Sleep apnea     mild-no cpap  . Positive TB test   . Anemia   . Stroke [facility])     a. 2013: right frontal  . Arthritis   . Tachycardia-bradycardia syndrome [facility])     a. s/p Medtronic [place], model number [id number], serial number [id number] H 12/2013  . History of shingles   . Chronic diastolic (congestive) heart failure ([place])     a. Echo 01/2015 with EF of 55-60%.  . Chronic atrial fibrillation (HCC)     a. on Coumadin, Digoxin, and Verapamil   Past Surgical History:  Past Surgical History  Procedure Laterality Date  . Breast lumpectomy Bilateral     negative for cancer  . Esophagogastroduodenoscopy  05/19/2011    Procedure: ESOPHAGOGASTRODUODENOSCOPY (EGD);  Surgeon: Lear Ng, MD;  Location: [facility] ENDOSCOPY;  Service: Endoscopy;  Laterality: N/A;  doctor aware of inr   will try to  be here no later than 230  . Cholecystectomy  2005  . Esophagogastroduodenoscopy  06/22/2011    Procedure: ESOPHAGOGASTRODUODENOSCOPY (EGD);  Surgeon: Arta Silence, MD;  Location: [facility] ENDOSCOPY;  Service: Endoscopy;  Laterality: N/A;  Check PT/INR in am  . Givens capsule study  06/23/2011    Procedure: GIVENS CAPSULE STUDY;  Surgeon: Arta Silence, MD;  Location: [facility] ENDOSCOPY;  Service: Endoscopy;  Laterality: N/A;  . Colonoscopy  08/11/2011    Procedure: COLONOSCOPY;  Surgeon: Jeryl Columbia, MD;  Location: [facility] ENDOSCOPY;  Service: Endoscopy;  Laterality: N/A;  . Hot hemostasis   08/11/2011    Procedure: HOT HEMOSTASIS (ARGON PLASMA COAGULATION/BICAP);  Surgeon: Jeryl Columbia, MD;  Location: [facility] ENDOSCOPY;  Service: Endoscopy;  Laterality: N/A;  . Mass excision Left 09/14/2013    Procedure: EXCISION MASS LEFT WRIST;  Surgeon: Leanora Cover, MD;  Location: [place];  Service: Orthopedics;  Laterality: Left;  . Tonsillectomy and adenoidectomy  1950  . Insert / replace / remove pacemaker  2001    Last generator in 2006; interrogated Dec 2012  . Cataract extraction w/ intraocular lens  implant, bilateral Bilateral   . Lipoma excision Left 07/2013    wrist  . Pacemaker generator change N/A 01/02/2014    Procedure: PACEMAKER GENERATOR CHANGE;  Surgeon: Sanda Klein, MD;  Location: [place] CATH LAB;  Service: Cardiovascular;  Laterality: N/A;  . Lead revision N/A 01/02/2014    Procedure: LEAD REVISION;  Surgeon: Sanda Klein, MD;  Location: [place] CATH LAB;  Service: Cardiovascular;  Laterality: N/A;  . Subxyphoid pericardial window N/A 01/16/2015    Procedure: SUBXYPHOID PERICARDIAL WINDOW;  Surgeon: Grace Isaac, MD;  Location: [place];  Service: Thoracic;  Laterality: N/A;  . Tee without cardioversion N/A 01/16/2015    Procedure: TRANSESOPHAGEAL ECHOCARDIOGRAM (TEE);  Surgeon: Grace Isaac, MD;  Location: [place];  Service: Thoracic;  Laterality: N/A;  . Colon resection N/A 05/31/2015    Procedure: SIGMOID COLECTOMY;  Surgeon: Donnie Mesa, MD;  Location: [place];  Service: General;  Laterality: N/A;  . Colostomy N/A 05/31/2015    Procedure: DESCENDING COLOSTOMY;  Surgeon: Donnie Mesa, MD;  Location: [place];  Service: General;  Laterality: N/A;  . Bowel resection N/A 05/31/2015    Procedure: SMALL BOWEL RESECTION;  Surgeon: Donnie Mesa, MD;  Location: [place];  Service: General;  Laterality: N/A;  . Debridement of abdominal wall abscess N/A 05/31/2015    Procedure: DRAINAGE OF PELVIC ABSCESS;  Surgeon: Donnie Mesa, MD;  Location: [place];  Service: General;  Laterality:  N/A;   Social History:  reports that she has never smoked. She has never used smokeless tobacco. She reports that she drinks alcohol. She reports that she does not use illicit drugs.  Family / Support Systems Marital Status: Widow/Widower How Long?: 17 yrs Patient Roles: Parent Children: Daughters:  Melinda Crutch [facility]) @ [phone];  Beverly Milch [facility]) and Helene Kelp ([place]) Anticipated Caregiver: Pt reports that they do not yet have a definite plan of who might stay with pt.  [name] is most available to assist. Ability/Limitations of Caregiver: [name] is only one not working;  the other two are working p/t and [name] is going through chemo Caregiver Availability:  (TBD) Family Dynamics: Pt almost guarded with any questions about d/c support plan.  Daughters are supportive and involved and it appears that pt is reluctant to have anyone stay with her.  Social History Preferred language: English Religion: Baptist Cultural Background: NA Education: college  Read: Yes Write: Yes Employment Status: Retired Date Retired/Disabled/Unemployed: "in my 86's" - she is a retired Therapist, sports from [employer] Issues: None Guardian/Conservator: None - per MD, pt i capable of making decisions on her own behalf   Abuse/Neglect Physical Abuse: Denies Verbal Abuse: Denies Sexual Abuse: Denies Exploitation of patient/patient's resources: Denies Self-Neglect: Denies  Emotional Status Pt's affect, behavior adn adjustment status: Pt able to complete assessment interview without much difficulty except she is very HOH.  As noted, when questioned about d/c plans, she reports that it is "under discussion".  She denies any significant emotional distress.  She does admit much frustration with lengthy medical course beginning in Dec. She is frustrated with her loss of strength and need for therapies.  Will monitor throughout stay and refer for neuropsych as indicated. Recent Psychosocial Issues:  Pt reports ongoing medical problems since Dec that have frustrated her. Pyschiatric History: None Substance Abuse History: None  Patient / Family Perceptions, Expectations & Goals Pt/Family understanding of illness & functional limitations: Pt reports that she is a retired Therapist, sports and feels she has a very good understanding of her multiple medical issues.  She also feels she will be able to competently manage her colostomy and is open to teaching. Premorbid pt/family roles/activities: Pt was independent PTA.  Notes she has not really driven for the past couple of months "because my daughters won't let me." Anticipated changes in roles/activities/participation: dependent on goals reached, pt my need 24/7 supervision which indicates a daughter will have to assume primary caregiver role. Pt/family expectations/goals: "I just want to get home."  [employer]: None Premorbid Home Care/DME Agencies: None Transportation available at discharge: yes Resource referrals recommended: Neuropsychology  Discharge Planning Living Arrangements: Alone Support Systems: Children Type of Residence: Private residence Insurance Resources: [employer], Multimedia programmer (specify) Financial Resources: Social Security Financial Screen Referred: No Living Expenses: Own Money Management: Patient Does the patient have any problems obtaining your medications?: No Home Management: pt was independent Patient/Family Preliminary Plans: d/c plan still under discussion Social Work Anticipated Follow Up Needs: HH/OP  Clinical Impression Elderly, frail appearing woman here following multiple medical events and now with colostomy.  Three local daughters and all in discussion about d/c plan with consideration that pt may stay with one of them.  Pt denies any emotional distress beyond frustration with her current debility.  Will follow for support and d/c planning needs.  Maddux First 06/18/2015, 9:48  AM

## 2015-06-17 NOTE — Progress Notes (Signed)
[facility] PHYSICAL MEDICINE & REHABILITATION     PROGRESS NOTE    Subjective/Complaints: Had a resaonable weekend. Not "looking forward to all activities today". Heat helps back.   ROS: Pt denies fever, rash/itching, headache, blurred or double vision, nausea, vomiting, abdominal pain, diarrhea, chest pain, shortness of breath, palpitations, dysuria, dizziness, neck or back pain, bleeding, anxiety, or depression   Objective: Vital Signs: Blood pressure 107/64, pulse 106, temperature 98.6 F (37 C), temperature source Oral, resp. rate 18, weight 68.9 kg (151 lb 14.4 oz), SpO2 93 %. No results found.  Recent Labs  06/17/15 0630  WBC 5.7  HGB 8.7*  HCT 28.1*  PLT 320    Recent Labs  06/16/15 0409 06/17/15 0630  NA 138 137  K 2.9* 3.0*  CL 100* 100*  GLUCOSE 141* 107*  BUN 9 8  CREATININE 1.12* 1.05*  CALCIUM 7.7* 7.8*   CBG (last 3)   Recent Labs  06/16/15 1631 06/16/15 2121 06/17/15 0658  GLUCAP 143* 151* 98    Wt Readings from Last 3 Encounters:  06/17/15 68.9 kg (151 lb 14.4 oz)  06/14/15 73.846 kg (162 lb 12.8 oz)  05/25/15 56.518 kg (124 lb 9.6 oz)    Physical Exam:  Constitutional: She appears well-developed and well-nourished.  HENT: dentition fair Head: Normocephalic and atraumatic.  Eyes: Conjunctivae and EOM are normal.  Neck: Normal range of motion. Neck supple. No thyromegaly present.  Cardiovascular:  Murmur. No rubs or gallops Irregularly irregular, 90's to 100's Respiratory: Effort normal and breath sounds normal. No respiratory distress.  GI: Soft. Bowel sounds are normal.  Colostomy in place. Abdominal incision c/d/i. Pain in LLQ Musculoskeletal: She exhibits edema. She exhibits no tenderness.  Neurological: She is alert. Sitting EOB with good sitting balance A&O2 HOH Sensation intact light touch neck sign DTRs symmetric Motor: Bilateral upper extremities 5/5 proximal to distal Left lower extremity: Hip flexion, knee  extension 3 -/5, ankle dorsi/plantar flexion were 5/5 Right lower extremity: Hip flexion 3-/5, knee extension 3/5, ankle dorsi/plantarflexion 5/5  Skin: Skin is warm and dry.  Psychiatric: She has a normal mood and affect. Her behavior is normal  Assessment/Plan: 1. Debility secondary to perforated sigmoid colon/colectomy/colostomy which require 3+ hours per day of interdisciplinary therapy in a comprehensive inpatient rehab setting. Physiatrist is providing close team supervision and 24 hour management of active medical problems listed below. Physiatrist and rehab team continue to assess barriers to discharge/monitor patient progress toward functional and medical goals.  Function:  Bathing Bathing position   Position: Shower  Bathing parts Body parts bathed by patient: Right arm, Left arm, Chest, Abdomen, Front perineal area, Right upper leg, Left upper leg Body parts bathed by helper: Buttocks, Right lower leg, Left lower leg, Back  Bathing assist Assist Level: Touching or steadying assistance(Pt > 75%)      Upper Body Dressing/Undressing Upper body dressing   What is the patient wearing?: Pull over shirt/dress     Pull over shirt/dress - Perfomed by patient: Thread/unthread right sleeve, Thread/unthread left sleeve, Put head through opening Pull over shirt/dress - Perfomed by helper: Pull shirt over trunk        Upper body assist Assist Level: Touching or steadying assistance(Pt > 75%)      Lower Body Dressing/Undressing Lower body dressing   What is the patient wearing?: Pants, Non-skid slipper socks       Pants- Performed by helper: Thread/unthread right pants leg, Thread/unthread left pants leg, Pull pants up/down   Non-skid  slipper socks- Performed by helper: Don/doff right sock, Don/doff left sock                  Lower body assist Assist for lower body dressing: Touching or steadying assistance (Pt > 75%)      Toileting Toileting   Toileting steps  completed by patient: Performs perineal hygiene Toileting steps completed by helper: Adjust clothing prior to toileting, Adjust clothing after toileting Toileting Assistive Devices: Grab bar or rail, Other (comment) (BSC in use)  Toileting assist Assist level: Touching or steadying assistance (Pt.75%)   Transfers Chair/bed transfer   Chair/bed transfer method: Stand pivot Chair/bed transfer assist level: Moderate assist (Pt 50 - 74%/lift or lower) Chair/bed transfer assistive device: Armrests, Medical sales representative     Max distance: 36ft Assist level: Touching or steadying assistance (Pt > 75%)   Wheelchair   Type: Manual Max wheelchair distance: 5ft Assist Level: Supervision or verbal cues  Cognition Comprehension Comprehension assist level: Understands basic 90% of the time/cues < 10% of the time  Expression Expression assist level: Expresses basic 90% of the time/requires cueing < 10% of the time.  Social Interaction Social Interaction assist level: Interacts appropriately with others - No medications needed.  Problem Solving Problem solving assist level: Solves basic 90% of the time/requires cueing < 10% of the time  Memory Memory assist level: More than reasonable amount of time   Medical Problem List and Plan: 1. Debilitation secondary to perforated diverticulitis status post sigmoid colectomy, descending colostomy with small bowel resection placement of pelvic abscess drain 05/05/2017Complicated by GI bleed and ileus -continue with rehab---appears to be showing some improvement in stamina 2. DVT Prophylaxis/Anticoagulation: Coumadin to be resumed---start today 3. Pain Management: Tylenol as needed 4. Acute blood loss anemia/GI bleed. Follow-up CBC with hgb 8.7. No signs of blood loss  -I personally reviewed the patient's labs today.  5. Neuropsych: This patient is capable of making decisions on her own behalf. 6. Skin/Wound Care: Routine skin  checks/colostomy education and skin care -wounds clean/intact, ostomy is competent  7. Fluids/Electrolytes/Nutrition: Routine I&O's  -potassium still ow (3) but climbing---continue supplementation---recheck tomorrow 8.ID. Continue vancomycin and Zosyn through 06/14/2015 and then PO taper with vancomycin as directed then began vancomycin 125 mg twice a day 1 week then 125 mg daily 1 week then 125 mg every other day for 2 weeks and stop -contact precautions continue 9.HTN.Verapamil 120 mg daily. 10. Atrial fibrillation. Tachybradycardia syndrome with pacemaker. Cardiac rate control. Coumadin discontinued due to GI bleed. 11. Chronic renal insufficiency. Baseline creatinine 1.68. Follow-up chemistries 12. Chronic diastolic congestive heart failure.Demadex 80 mg daily. Monitor for any signs of fluid overload. Weighing patient daily   -weight 68kg today 13.C. difficile positive. Contact precautions 14. Diabetes mellitus. Diet controlled. Hemoglobin A1c 6.2. Sliding scale insulin. Check blood sugars before meals and at bedtime   LOS (Days) 3 A FACE TO FACE EVALUATION WAS PERFORMED  SWARTZ,ZACHARY T 06/17/2015 8:52 AM

## 2015-06-18 ENCOUNTER — Inpatient Hospital Stay (HOSPITAL_COMMUNITY): Payer: Medicare Other | Admitting: Physical Therapy

## 2015-06-18 ENCOUNTER — Inpatient Hospital Stay (HOSPITAL_COMMUNITY): Payer: Medicare Other | Admitting: Occupational Therapy

## 2015-06-18 DIAGNOSIS — E876 Hypokalemia: Secondary | ICD-10-CM

## 2015-06-18 LAB — BASIC METABOLIC PANEL
ANION GAP: 9 (ref 5–15)
BUN: 9 mg/dL (ref 6–20)
CO2: 30 mmol/L (ref 22–32)
Calcium: 7.8 mg/dL — ABNORMAL LOW (ref 8.9–10.3)
Chloride: 98 mmol/L — ABNORMAL LOW (ref 101–111)
Creatinine, Ser: 0.94 mg/dL (ref 0.44–1.00)
GFR calc Af Amer: >60 mL/min (ref >60)
GFR, EST NON AFRICAN AMERICAN: 52 mL/min — AB (ref >60)
GLUCOSE: 98 mg/dL (ref 65–99)
POTASSIUM: 2.6 mmol/L — AB (ref 3.5–5.1)
Sodium: 137 mmol/L (ref 135–145)

## 2015-06-18 LAB — PROTIME-INR
INR: 1.24 (ref 0.00–1.49)
Prothrombin Time: 15.7 seconds — ABNORMAL HIGH (ref 11.6–15.2)

## 2015-06-18 LAB — CBC
HEMATOCRIT: 28.3 % — AB (ref 36.0–46.0)
HEMOGLOBIN: 8.7 g/dL — AB (ref 12.0–15.0)
MCH: 26.9 pg (ref 26.0–34.0)
MCHC: 30.7 g/dL (ref 30.0–36.0)
MCV: 87.3 fL (ref 78.0–100.0)
Platelets: 330 10*3/uL (ref 150–400)
RBC: 3.24 MIL/uL — ABNORMAL LOW (ref 3.87–5.11)
RDW: 18.2 % — ABNORMAL HIGH (ref 11.5–15.5)
WBC: 6.5 10*3/uL (ref 4.0–10.5)

## 2015-06-18 LAB — GLUCOSE, CAPILLARY
GLUCOSE-CAPILLARY: 149 mg/dL — AB (ref 65–99)
Glucose-Capillary: 93 mg/dL (ref 65–99)

## 2015-06-18 LAB — MAGNESIUM: Magnesium: 1.4 mg/dL — ABNORMAL LOW (ref 1.7–2.4)

## 2015-06-18 MED ORDER — POTASSIUM CHLORIDE CRYS ER 20 MEQ PO TBCR
40.0000 meq | EXTENDED_RELEASE_TABLET | Freq: Two times a day (BID) | ORAL | Status: DC
  Administered 2015-06-18 – 2015-06-19 (×3): 40 meq via ORAL
  Filled 2015-06-18 (×4): qty 2

## 2015-06-18 MED ORDER — MAGNESIUM SULFATE 2 GM/50ML IV SOLN
2.0000 g | Freq: Once | INTRAVENOUS | Status: AC
  Administered 2015-06-18: 2 g via INTRAVENOUS
  Filled 2015-06-18: qty 50

## 2015-06-18 MED ORDER — WARFARIN SODIUM 2.5 MG PO TABS
2.5000 mg | ORAL_TABLET | Freq: Once | ORAL | Status: AC
  Administered 2015-06-18: 2.5 mg via ORAL
  Filled 2015-06-18: qty 1

## 2015-06-18 MED ORDER — INSULIN ASPART 100 UNIT/ML ~~LOC~~ SOLN
0.0000 [IU] | Freq: Three times a day (TID) | SUBCUTANEOUS | Status: DC
  Administered 2015-06-18 – 2015-06-19 (×2): 1 [IU] via SUBCUTANEOUS

## 2015-06-18 NOTE — Progress Notes (Signed)
Occupational Therapy Session Note  Patient Details  Name: [name] MRN: [id number] Date of Birth: October 06, 1923  Today's Date: 06/18/2015 OT Individual Time: [id number] OT Individual Time Calculation (min): 58 min    Short Term Goals: Week 1:  OT Short Term Goal 1 (Week 1): Pt will groom in standing at sink for  minutes with SBA OT Short Term Goal 2 (Week 1): Pt will bathe self with SBA OT Short Term Goal 3 (Week 1): Pt will dress self with Min assist OT Short Term Goal 4 (Week 1): Pt will transfer to toilet with min assist OT Short Term Goal 5 (Week 1): Pt will transfer to shower with min assist  Skilled Therapeutic Interventions/Progress Updates:   Upon entering the room, pt seated on EOB with RN present in the room giving medications. Pt eating breakfast and daughter also present in the room. Pt with no c/o pain this session. While eating, OT educating pt on energy conservation techniques and providing pt with paper handout to be discussed further. Pt refused to ambulate to bathroom for toileting. Pt transferred with mod A and use of RW onto [facility]. Pt transferred into wheelchair for dressing and bathing tasks at sink level this session. Pt required min A for sit <>stand during LB tasks. Pt also refuses to ambulate short distance from wheelchair to recliner chair. Pt transferred from wheelchair with stand pivot transfer and mod A for lifting into recliner chair. Call bell and all needed items within reach upon exiting the room. Her daughter remained present in the room.   Therapy Documentation Precautions:  Precautions Precautions: Fall Precaution Comments: IV lines and colostomy bag.   Restrictions Weight Bearing Restrictions: No   Pain: Pain Assessment Pain Score: 0-No pain Pain Location: Back Pain Intervention(s): Medication (See eMAR)  See Function Navigator for Current Functional Status.   Therapy/Group: Individual Therapy  Phineas Semen 06/18/2015, 12:54 PM

## 2015-06-18 NOTE — Progress Notes (Signed)
Physical Therapy Session Note  Patient Details  Name: [name] MRN: [id number] Date of Birth: [birth year]  Today's Date: 06/18/2015 PT Individual Time: 1530-1620 PT Individual Time Calculation (min): 50 min   Short Term Goals: Week 1:  PT Short Term Goal 1 (Week 1): Patient will performed bed mobility with min A from PT PT Short Term Goal 2 (Week 1): Patient will perform WC<>bed transfer with min A and LRAD PT Short Term Goal 3 (Week 1): Patient will perform gait for 17f with RW and min A from PT   Skilled Therapeutic Interventions/Progress Updates:    Pt received resting in bed with c/o discomfort in her abdomen but states it's not painful, agreeable to therapy session.  Pt requesting to use BSC, supine>sit supervision with bed rails and HOB elevated, [facility] and verbal cues for hand placement.  Pt continent of urine and PT provided total assist to empty ostomy pouch.  Pouch noted to have fair amount of gas, and pt states her abdomen felt better after pouch was emptied.  Pt performed squat/pivot from BSC>EOB>w/c with supervision and verbal cues for head/hips relationship.  PT instructed pt in w/c propulsion throughout unit with multiple rest breaks (max ~100') with supervision and occasional mod verbal cues for more efficient propulsion and turning.  Pt negotiated through doorways and up/down 3% grade with supervision but continues to require min assist to navigate within room.  PT instructed pt in car transfer at simulate small SUV height with steady assist and verbal cues for hand placement.  Pt returned to room at end of session and agreeable to sit in recliner, but declined further therapy.  Ambulatory transfer to recliner with RW and steady assist.  Pt left in recliner with call bell in reach and needs met.   Therapy Documentation Precautions:  Precautions Precautions: Fall Precaution Comments: IV lines and colostomy bag.   Restrictions Weight Bearing  Restrictions: No General: PT Amount of Missed Time (min): 10 Minutes PT Missed Treatment Reason: Patient fatigue   See Function Navigator for Current Functional Status.   Therapy/Group: Individual Therapy  CEarnest ConroyPenven-Crew 06/18/2015, 4:45 PM

## 2015-06-18 NOTE — Progress Notes (Signed)
CRITICAL VALUE ALERT  Critical value received:  Potassium 2.6  Date of notification:  06/18/15  Time of notification:  0610  Critical value read back:Yes.    Nurse who received alert:  Tyrone Apple, RN   MD notified (1st page):   N/A  Time of first page:  0612 face to face  MD notified (2nd page):  Time of second page:  Responding MD:  Marlowe Shores, PA on unit. New orders received.   Time MD responded:  [phone]

## 2015-06-18 NOTE — Care Management Note (Signed)
[facility] Individual Statement of Services  Patient Name:  [name]  Date:  06/18/2015  Welcome to the [facility].  Our goal is to provide you with an individualized program based on your diagnosis and situation, designed to meet your specific needs.  With this comprehensive rehabilitation program, you will be expected to participate in at least 3 hours of rehabilitation therapies Monday-Friday, with modified therapy programming on the weekends.  Your rehabilitation program will include the following services:  Physical Therapy (PT), Occupational Therapy (OT), 24 hour per day rehabilitation nursing, Therapeutic Recreaction (TR), Neuropsychology, Case Management (Social Worker), Rehabilitation Medicine, Nutrition Services and Pharmacy Services  Weekly team conferences will be held on Wednesdays to discuss your progress.  Your Social Worker will talk with you frequently to get your input and to update you on team discussions.  Team conferences with you and your family in attendance may also be held.  Expected length of stay: 2 weeks  Overall anticipated outcome: supervision  Depending on your progress and recovery, your program may change. Your Social Worker will coordinate services and will keep you informed of any changes. Your Social Worker's name and contact numbers are listed  below.  The following services may also be recommended but are not provided by the [facility] will be made to provide these services after discharge if needed.  Arrangements include referral to agencies that provide these services.  Your insurance has been verified to be:  Medicare and [place] Your primary doctor is:  Dr. Everlene Farrier  Pertinent information will be shared with your doctor and your insurance company.  Social Worker:  [place], [place] or ([phone]   Information discussed with and copy given to patient by: Lennart Pall, 06/18/2015, 9:52 AM

## 2015-06-18 NOTE — Progress Notes (Signed)
[facility] PHYSICAL MEDICINE & REHABILITATION     PROGRESS NOTE    Subjective/Complaints: Patient lying in bed this morning. She states he slept fairly well left overnight.  ROS: Denies CP, SOB, N/V/D.  Objective: Vital Signs: Blood pressure 110/66, pulse 101, temperature 98.5 F (36.9 C), temperature source Oral, resp. rate 18, weight 70.035 kg (154 lb 6.4 oz), SpO2 97 %. No results found.  Recent Labs  06/17/15 0630 06/18/15 0500  WBC 5.7 6.5  HGB 8.7* 8.7*  HCT 28.1* 28.3*  PLT 320 330    Recent Labs  06/17/15 0630 06/18/15 0500  NA 137 137  K 3.0* 2.6*  CL 100* 98*  GLUCOSE 107* 98  BUN 8 9  CREATININE 1.05* 0.94  CALCIUM 7.8* 7.8*   CBG (last 3)   Recent Labs  06/17/15 1644 06/17/15 2116 06/18/15 0652  GLUCAP 106* 147* 93    Wt Readings from Last 3 Encounters:  06/18/15 70.035 kg (154 lb 6.4 oz)  06/14/15 73.846 kg (162 lb 12.8 oz)  05/25/15 56.518 kg (124 lb 9.6 oz)    Physical Exam:  Constitutional: She appears well-developed and well-nourished.  HENT: dentition fair Head: Normocephalic and atraumatic.  Eyes: Conjunctivae and EOM are normal.  Neck: Normal range of motion. Neck supple. No thyromegaly present.  Cardiovascular: +Murmur. Irregularly irregular Respiratory: Effort normal and breath sounds normal. No respiratory distress.  GI: Soft. Bowel sounds are normal.  Colostomy in place. Abdominal incision c/d/i. Musculoskeletal: She exhibits edema. She exhibits no tenderness.  Neurological: She is alert. Sitting EOB with good sitting balance A&O2 HOH Motor: Bilateral upper extremities 5/5 proximal to distal Left lower extremity: Hip flexion, knee extension 4/5, ankle dorsi/plantar flexion were 5/5 Right lower extremity: Hip flexion knee extension 4/5, ankle dorsi/plantarflexion 5/5  Skin: Skin is warm and dry.  Psychiatric: She has a normal mood and affect. Her behavior is normal  Assessment/Plan: 1. Debility secondary to  perforated sigmoid colon/colectomy/colostomy which require 3+ hours per day of interdisciplinary therapy in a comprehensive inpatient rehab setting. Physiatrist is providing close team supervision and 24 hour management of active medical problems listed below. Physiatrist and rehab team continue to assess barriers to discharge/monitor patient progress toward functional and medical goals.  Function:  Bathing Bathing position   Position: Shower  Bathing parts Body parts bathed by patient: Right arm, Left arm, Chest, Abdomen, Front perineal area, Right upper leg, Left upper leg Body parts bathed by helper: Buttocks, Right lower leg, Left lower leg, Back  Bathing assist Assist Level: Touching or steadying assistance(Pt > 75%)      Upper Body Dressing/Undressing Upper body dressing   What is the patient wearing?: Pull over shirt/dress     Pull over shirt/dress - Perfomed by patient: Thread/unthread right sleeve, Thread/unthread left sleeve, Put head through opening Pull over shirt/dress - Perfomed by helper: Pull shirt over trunk        Upper body assist Assist Level: Touching or steadying assistance(Pt > 75%)      Lower Body Dressing/Undressing Lower body dressing   What is the patient wearing?: Pants, Non-skid slipper socks       Pants- Performed by helper: Thread/unthread right pants leg, Thread/unthread left pants leg, Pull pants up/down   Non-skid slipper socks- Performed by helper: Don/doff right sock, Don/doff left sock                  Lower body assist Assist for lower body dressing: Touching or steadying assistance (Pt > 75%)  Toileting Toileting   Toileting steps completed by patient: Adjust clothing prior to toileting, Performs perineal hygiene, Adjust clothing after toileting Toileting steps completed by helper: Adjust clothing prior to toileting, Adjust clothing after toileting Toileting Assistive Devices: Grab bar or rail  Toileting assist Assist  level: Touching or steadying assistance (Pt.75%)   Transfers Chair/bed transfer   Chair/bed transfer method: Squat pivot, Ambulatory Chair/bed transfer assist level: Touching or steadying assistance (Pt > 75%) Chair/bed transfer assistive device: Armrests, Medical sales representative     Max distance: 75 Assist level: Touching or steadying assistance (Pt > 75%)   Wheelchair   Type: Manual Max wheelchair distance: 7ft Assist Level: Supervision or verbal cues  Cognition Comprehension Comprehension assist level: Understands complex 90% of the time/cues 10% of the time  Expression Expression assist level: Expresses basic 90% of the time/requires cueing < 10% of the time.  Social Interaction Social Interaction assist level: Interacts appropriately with others - No medications needed.  Problem Solving Problem solving assist level: Solves basic 90% of the time/requires cueing < 10% of the time  Memory Memory assist level: More than reasonable amount of time   Medical Problem List and Plan: 1. Debilitation secondary to perforated diverticulitis status post sigmoid colectomy, descending colostomy with small bowel resection placement of pelvic abscess drain [id number] complicated by GI bleed and ileus -continue CIR 2. DVT Prophylaxis/Anticoagulation: Coumadin resumed 3. Pain Management: Tylenol as needed 4. Acute blood loss anemia/GI bleed.   hgb 8.7 on 5/23 5. Neuropsych: This patient is capable of making decisions on her own behalf. 6. Skin/Wound Care: Routine skin checks/colostomy education and skin care -wounds clean/intact, ostomy is competent  7. Fluids/Electrolytes/Nutrition: Routine I&O's  -Hypokalemia: 2.6 on 5/23, repleting  -Hypomagnesemia: 1.4 on 5/23, IV mag ordered for 5/23, will consider scheduling mag 8. C. Dif: Per ID - Continued vancomycin and Zosyn through 06/14/2015 and then PO taper with vancomycin as directed then began vancomycin 125  mg twice a day 1 week then 125 mg daily 1 week then 125 mg every other day for 2 weeks and stop -contact precautions continue 9.HTN.Verapamil 120 mg daily. 10. Atrial fibrillation. Tachybradycardia syndrome with pacemaker. Cardiac rate control. Coumadin discontinued due to GI bleed. 11. Chronic renal insufficiency. Baseline creatinine 1.68. Follow-up chemistries  -Cr. 0.94 on 5/23 12. Chronic diastolic congestive heart failure.Demadex 80 mg daily. Monitor for any signs of fluid overload. Weighing patient daily   -weight 70kg today, slightly elevated from 68 yesterday  -Will cont to monitor 13.C. difficile positive. Contact precautions 14. Diabetes mellitus. Diet controlled. Hemoglobin A1c 6.2. Sliding scale insulin. Check blood sugars before meals and at bedtime   LOS (Days) 4 A FACE TO FACE EVALUATION WAS PERFORMED  Ankit Lorie Phenix 06/18/2015 9:11 AM

## 2015-06-18 NOTE — Progress Notes (Signed)
ANTICOAGULATION CONSULT NOTE - Follow Up Consult  Pharmacy Consult for coumadin Indication: afib and stroke  Allergies  Allergen Reactions  . Anti-Inflammatory Enzyme [Nutritional Supplements] Other (See Comments)    Retains fluids and headaches  . Iodinated Diagnostic Agents Hives    HIVES 15MIN S/P IV CONTRAST INJECTION,WILL NEED 13 HR PREP FOR FUTURE INJECTIONS, ok s/p 50mg  po benadryl//a.calhoun  . Spironolactone Other (See Comments)    Hair loss  . Arthrotec [Diclofenac-Misoprostol] Other (See Comments)    unknown  . Biaxin [Clarithromycin] Other (See Comments)    unknown  . Plavix [Clopidogrel Bisulfate] Other (See Comments)    unknown    Patient Measurements: Weight: 154 lb 6.4 oz (70.035 kg) Heparin Dosing Weight:   Vital Signs: Temp: 98.5 F (36.9 C) (05/23 0518) Temp Source: Oral (05/23 0518) BP: 110/66 mmHg (05/23 0518) Pulse Rate: 101 (05/23 0518)  Labs:  Recent Labs  06/16/15 0409 06/17/15 0630 06/18/15 0500  HGB  --  8.7* 8.7*  HCT  --  28.1* 28.3*  PLT  --  320 330  LABPROT 14.9 15.0 15.7*  INR 1.15 1.17 1.24  CREATININE 1.12* 1.05* 0.94    Estimated Creatinine Clearance: 39.1 mL/min (by C-G formula based on Cr of 0.94).   Medications:  Scheduled:  . digoxin  0.0625 mg Oral Daily  . guaiFENesin  200 mg Oral BID  . pantoprazole  40 mg Oral Daily  . potassium chloride  40 mEq Oral BID  . saccharomyces boulardii  250 mg Oral BID  . sodium chloride flush  10-40 mL Intracatheter Q12H  . torsemide  40 mg Oral Daily  . vancomycin  125 mg Oral Q12H   Followed by  . [START ON 06/24/2015] vancomycin  125 mg Oral Daily   Followed by  . [START ON 07/01/2015] vancomycin  125 mg Oral Q48H  . verapamil  120 mg Oral Daily  . warfarin  2.5 mg Oral ONCE-1800  . Warfarin - Pharmacist Dosing Inpatient   Does not apply q1800  . zolpidem  2.5 mg Oral QHS   Infusions:    Assessment: 82 yo female with afib and stroke is currently on subtherapeutic coumadin.   INR today is 1.24.  Goal of Therapy:  INR 2-3 Monitor platelets by anticoagulation protocol: Yes   Plan:  Coumadin 2.5 mg po x 1 Daily PT/INR.   Exavior Kimmons, Tsz-Yin 06/18/2015,8:29 AM

## 2015-06-18 NOTE — Progress Notes (Signed)
Physical Therapy Session Note  Patient Details  Name: [name] MRN: [id number] Date of Birth: [birth year]  Today's Date: 06/18/2015 PT Individual Time: 8841-6606 PT Individual Time Calculation (min): 73 min   Short Term Goals: Week 1:  PT Short Term Goal 1 (Week 1): Patient will performed bed mobility with min A from PT PT Short Term Goal 2 (Week 1): Patient will perform WC<>bed transfer with min A and LRAD PT Short Term Goal 3 (Week 1): Patient will perform gait for 27f with RW and min A from PT   Skilled Therapeutic Interventions/Progress Updates:    Pt received resting in recliner with no c/o pain and agreeable to therapy session.  Session focus on activity tolerance, gait with RW, stair negotiation, and LE strengthening.    Gait training x75' + 75' +75' with RW and supervision with extended seated rest breaks between trials.  PT instructed pt in stair negotiation 2x4 steps with min assist for first step, fade to mod assist following due to fatigue.  Verbal cues for sequencing of LE advancement, hand placement, and upright posture.    PT instructed pt in seated and standing therex 8-10 reps bilaterally: LAQ with 2 sec hold with 2# weight, hip flexion in sitting, standing hip abd, minisquats, heel/toe raises, and hamstring curls with no weights.  PT provided seated rest breaks as needed and discussed d/c plans regarding home destination (pt thinks she will d/c to her home at this point), and car choice (will plan to use daughters Outback versus pt's large SUV).    Pt returned to room in w/c at end of session and performs stand/pivot back to bed with supervision and sit>supine with mod assist to lift LEs into bed and position to comfort.  Call bell in reach and needs met.   Therapy Documentation Precautions:  Precautions Precautions: Fall Precaution Comments: IV lines and colostomy bag.   Restrictions Weight Bearing Restrictions: No   See Function Navigator for Current  Functional Status.   Therapy/Group: Individual Therapy  CEarnest ConroyPenven-Crew 06/18/2015, 11:41 AM

## 2015-06-18 NOTE — Plan of Care (Signed)
Problem: RH BOWEL ELIMINATION Goal: RH STG MANAGE BOWEL W/EQUIPMENT W/ASSISTANCE STG Manage Bowel With Equipment With min Assistance  Outcome: Not Progressing Staff still caring for colostomy

## 2015-06-19 ENCOUNTER — Inpatient Hospital Stay (HOSPITAL_COMMUNITY): Payer: Medicare Other | Admitting: Physical Therapy

## 2015-06-19 ENCOUNTER — Inpatient Hospital Stay (HOSPITAL_COMMUNITY): Payer: Medicare Other

## 2015-06-19 ENCOUNTER — Inpatient Hospital Stay (HOSPITAL_COMMUNITY): Payer: Medicare Other | Admitting: Occupational Therapy

## 2015-06-19 DIAGNOSIS — I509 Heart failure, unspecified: Secondary | ICD-10-CM

## 2015-06-19 DIAGNOSIS — R600 Localized edema: Secondary | ICD-10-CM

## 2015-06-19 DIAGNOSIS — K5732 Diverticulitis of large intestine without perforation or abscess without bleeding: Secondary | ICD-10-CM

## 2015-06-19 DIAGNOSIS — R609 Edema, unspecified: Secondary | ICD-10-CM

## 2015-06-19 DIAGNOSIS — I4891 Unspecified atrial fibrillation: Secondary | ICD-10-CM

## 2015-06-19 LAB — BASIC METABOLIC PANEL
Anion gap: 6 (ref 5–15)
BUN: 9 mg/dL (ref 6–20)
CO2: 32 mmol/L (ref 22–32)
Calcium: 8 mg/dL — ABNORMAL LOW (ref 8.9–10.3)
Chloride: 99 mmol/L — ABNORMAL LOW (ref 101–111)
Creatinine, Ser: 0.9 mg/dL (ref 0.44–1.00)
GFR calc Af Amer: >60 mL/min (ref >60)
GFR calc non Af Amer: 55 mL/min — ABNORMAL LOW (ref >60)
Glucose, Bld: 103 mg/dL — ABNORMAL HIGH (ref 65–99)
Potassium: 3.4 mmol/L — ABNORMAL LOW (ref 3.5–5.1)
Sodium: 137 mmol/L (ref 135–145)

## 2015-06-19 LAB — PROTIME-INR
INR: 1.14 (ref 0.00–1.49)
Prothrombin Time: 14.8 seconds (ref 11.6–15.2)

## 2015-06-19 LAB — GLUCOSE, CAPILLARY
Glucose-Capillary: 92 mg/dL (ref 65–99)
Glucose-Capillary: 135 mg/dL — ABNORMAL HIGH (ref 65–99)

## 2015-06-19 LAB — MAGNESIUM: MAGNESIUM: 1.6 mg/dL — AB (ref 1.7–2.4)

## 2015-06-19 MED ORDER — MAGNESIUM GLUCONATE 500 MG PO TABS
500.0000 mg | ORAL_TABLET | Freq: Every day | ORAL | Status: DC
  Administered 2015-06-19 – 2015-06-26 (×8): 500 mg via ORAL
  Filled 2015-06-19 (×8): qty 1

## 2015-06-19 MED ORDER — BENEPROTEIN PO POWD
1.0000 | Freq: Three times a day (TID) | ORAL | Status: DC
  Administered 2015-06-19 – 2015-06-25 (×2): 6 g via ORAL
  Filled 2015-06-19: qty 227

## 2015-06-19 MED ORDER — POTASSIUM CHLORIDE CRYS ER 20 MEQ PO TBCR
40.0000 meq | EXTENDED_RELEASE_TABLET | Freq: Every day | ORAL | Status: DC
  Administered 2015-06-20: 40 meq via ORAL
  Filled 2015-06-19: qty 2

## 2015-06-19 MED ORDER — WARFARIN SODIUM 5 MG PO TABS
5.0000 mg | ORAL_TABLET | Freq: Once | ORAL | Status: AC
  Administered 2015-06-19: 5 mg via ORAL
  Filled 2015-06-19: qty 1

## 2015-06-19 NOTE — Progress Notes (Signed)
Occupational Therapy Session Note  Patient Details  Name: [name] MRN: [id number] Date of Birth: 01/23/1924  Today's Date: 06/19/2015 OT Individual Time: 0702-0800 OT Individual Time Calculation (min): 58 min    Short Term Goals: Week 1:  OT Short Term Goal 1 (Week 1): Pt will groom in standing at sink for  minutes with SBA OT Short Term Goal 2 (Week 1): Pt will bathe self with SBA OT Short Term Goal 3 (Week 1): Pt will dress self with Min assist OT Short Term Goal 4 (Week 1): Pt will transfer to toilet with min assist OT Short Term Goal 5 (Week 1): Pt will transfer to shower with min assist  Skilled Therapeutic Interventions/Progress Updates:    Upon entering the room, pt seated on EOB finishing breakfast with no c/o pain this session. Pt declined toileting but agreeable to OT intervention this session. When asked about discharge plans based on current supervision level goals pt verbalized , " I have some people who can help me." However, pt was unable to verbalize who would be assisting her at discharge. Education to continue on this topic. Pt ambulated 5' with RW to sit in wheelchair at sink for self care tasks at sink side. Pt refused to wear compression hose this session. MD notified. Pt required assist to don clothing onto B feet this session. AE will likely increase I for this task and decreased pt frustration. Pt needing min A for balance to pull clothing over B hips. Pt performed grooming at sink while seated with supervision. Pt transferred to recliner chair at end of session with steady assist and no use of RW for stand pivot. B LEs elevated and call bell placed within reach upon exiting the room.   Therapy Documentation Precautions:  Precautions Precautions: Fall Precaution Comments: IV lines and colostomy bag.   Restrictions Weight Bearing Restrictions: No Vital Signs: Therapy Vitals Temp: 98.6 F (37 C) Temp Source: Oral Pulse Rate: 95 Resp: 18 BP: 110/65  mmHg Patient Position (if appropriate): Lying Oxygen Therapy SpO2: 98 % O2 Device: Not Delivered  See Function Navigator for Current Functional Status.   Therapy/Group: Individual Therapy  Phineas Semen 06/19/2015, 4:59 PM

## 2015-06-19 NOTE — Progress Notes (Signed)
ANTICOAGULATION CONSULT NOTE - Follow Up Consult  Pharmacy Consult for coumadin Indication: atrial fibrillation and hx of stroke  Allergies  Allergen Reactions  . Anti-Inflammatory Enzyme [Nutritional Supplements] Other (See Comments)    Retains fluids and headaches  . Iodinated Diagnostic Agents Hives    HIVES 15MIN S/P IV CONTRAST INJECTION,WILL NEED 13 HR PREP FOR FUTURE INJECTIONS, ok s/p 50mg  po benadryl//a.calhoun  . Spironolactone Other (See Comments)    Hair loss  . Arthrotec [Diclofenac-Misoprostol] Other (See Comments)    unknown  . Biaxin [Clarithromycin] Other (See Comments)    unknown  . Plavix [Clopidogrel Bisulfate] Other (See Comments)    unknown    Patient Measurements: Weight: 149 lb 0.5 oz (67.6 kg) Heparin Dosing Weight:   Vital Signs: Temp: 98.6 F (37 C) (05/24 0628) Temp Source: Oral (05/24 0628) BP: 102/87 mmHg (05/24 0628) Pulse Rate: 92 (05/24 0628)  Labs:  Recent Labs  06/17/15 0630 06/18/15 0500 06/19/15 0500  HGB 8.7* 8.7*  --   HCT 28.1* 28.3*  --   PLT 320 330  --   LABPROT 15.0 15.7* 14.8  INR 1.17 1.24 1.14  CREATININE 1.05* 0.94 0.90    Estimated Creatinine Clearance: 37.4 mL/min (by C-G formula based on Cr of 0.9).   Medications:  Scheduled:  . digoxin  0.0625 mg Oral Daily  . guaiFENesin  200 mg Oral BID  . insulin aspart  0-9 Units Subcutaneous TID WC  . magnesium gluconate  500 mg Oral Daily  . pantoprazole  40 mg Oral Daily  . potassium chloride  40 mEq Oral BID  . saccharomyces boulardii  250 mg Oral BID  . sodium chloride flush  10-40 mL Intracatheter Q12H  . torsemide  40 mg Oral Daily  . vancomycin  125 mg Oral Q12H   Followed by  . [START ON 06/24/2015] vancomycin  125 mg Oral Daily   Followed by  . [START ON 07/01/2015] vancomycin  125 mg Oral Q48H  . verapamil  120 mg Oral Daily  . Warfarin - Pharmacist Dosing Inpatient   Does not apply q1800  . zolpidem  2.5 mg Oral QHS   Infusions:    Assessment: 82  yo female with afib and hx of stroke is currently on subtherapeutic coumadin.  INR today is down to 1.14.  Goal of Therapy:  INR 2-3 Monitor platelets by anticoagulation protocol: Yes   Plan:  Coumadin 5 mg po x 1 Daily PT/INR.   Victorio Creeden, Tsz-Yin 06/19/2015,8:41 AM

## 2015-06-19 NOTE — Consult Note (Addendum)
WOC ostomy follow up Stoma type/location: LLQ, end colostomy Stomal assessment/size: 1" x 1 1/8" oval shaped, located in a crease at 3:00 o'clock and 9:00 o'clock, 100% slough, stoma retracted below skin level. Daughter and patient able to observe pouch change demonstration and both assisted in the procedure.  Peristomal assessment: intact skin surrounding Treatment options for stomal/peristomal skin: using 2" barrier ring to aid in seal Output: Mod amt semiformed stool in pouch Ostomy pouching: 1pc flat pouch with 2" barrier ring to maintain seal Education provided: Discussed pouching routines and ordering supplies.  Pt and daughter are able to open and close velcro to empty. They deny further questions at this time. Enrolled patient in [place] Start Discharge program: Yes Extra supplies at the bedside for staff nurse use. Please re-consult if further assistance is needed.  Thank-you,  Julien Girt MSN, [place], [place], [place], [place]

## 2015-06-19 NOTE — Progress Notes (Signed)
*  PRELIMINARY RESULTS* Vascular Ultrasound Right upper extremity venous duplex has been completed.  Preliminary findings: Short segment, non-occlusive DVT noted in the right proximal brachial vein.  Called results to [place], RN    Landry Mellow, RDMS, RVT  06/19/2015, 2:44 PM

## 2015-06-19 NOTE — Progress Notes (Signed)
Physical Therapy Session Note  Patient Details  Name: [name] MRN: [id number] Date of Birth: 13-Sep-1923  Today's Date: 06/19/2015 PT Individual Time: 0900-1010 PT Individual Time Calculation (min): 70 min   Short Term Goals: Week 1:  PT Short Term Goal 1 (Week 1): Patient will performed bed mobility with min A from PT PT Short Term Goal 2 (Week 1): Patient will perform WC<>bed transfer with min A and LRAD PT Short Term Goal 3 (Week 1): Patient will perform gait for 64f with RW and min A from PT   Skilled Therapeutic Interventions/Progress Updates:    Pt received sleeping in recliner, awakes to touch, no c/o pain, and agreeable to therapy session.  Session focus on activity tolerance, transfers, balance, and gait with RW.   Pt transfers with supervision throughout session using RW for sit<>stand, pivot, and ambulatory transfers.  Dynamic standing balance training reaching R, L, and across midline to move horseshoes to/from basketball goal.  Static standing with average BOS, stagger stance, and with narrow BOS x2-8 minutes playing cards with rest breaks in between each stance.    Gait training x100' with RW and supervision with occasional verbal cues for upright posture.  Gait training x50' with RW and supervision at end of session with close supervision and verbal cues for walker positioning.  Pt requesting to sit and propel w/c remaining distance to room.  W/C propulsion x110' with BUEs and intermittent use of BLEs with supervision>mod I with pt requiring multiple short rest breaks.    Pt returned to room at end of session and left upright in w/c with call bell in reach and needs met.  Wound care RN in to provide ostomy education as PT leaving.   Therapy Documentation Precautions:  Precautions Precautions: Fall Precaution Comments: IV lines and colostomy bag.   Restrictions Weight Bearing Restrictions: No   See Function Navigator for Current Functional  Status.   Therapy/Group: Individual Therapy  Knoxx Boeding E Penven-Crew 06/19/2015, 10:11 AM

## 2015-06-19 NOTE — Progress Notes (Signed)
Physical Therapy Session Note  Patient Details  Name: [name] MRN: [id number] Date of Birth: 1923-04-11  Today's Date: 06/19/2015 PT Individual Time: 1300-1400 PT Individual Time Calculation (min): 60 min   Short Term Goals: Week 1:  PT Short Term Goal 1 (Week 1): Patient will performed bed mobility with min A from PT PT Short Term Goal 2 (Week 1): Patient will perform WC<>bed transfer with min A and LRAD PT Short Term Goal 3 (Week 1): Patient will perform gait for 67f with RW and min A from PT   Skilled Therapeutic Interventions/Progress Updates:    Pt received resting in recliner and agreeable to therapy session, no c/o pain.  Pt taken to day room for patient party focus on cardiovascular endurance, standing tolerance, standing balance, activity tolerance, and UE/LE therex.  Pt requires supervision for sit<>stand transfer with RW and occasional steady assist with verbal cues for turning.  Pt requesting to toilet prior to returning to room and performed toileting tasks, transfers, and hygiene with steady assist.  Pt returned to room at end of session and left sitting upright in recliner with call bell in reach and needs met.   Therapy Documentation Precautions:  Precautions Precautions: Fall Precaution Comments: IV lines and colostomy bag.   Restrictions Weight Bearing Restrictions: No   See Function Navigator for Current Functional Status.   Therapy/Group: Individual Therapy  Ciara Kagan E Penven-Crew 06/19/2015, 3:00 PM

## 2015-06-19 NOTE — Progress Notes (Signed)
[facility] PHYSICAL MEDICINE & REHABILITATION     PROGRESS NOTE    Subjective/Complaints: Pt sitting up in her chair, working with OT.  She states she did not sleep well overnight and believes it is because of her pain medications.  She would like her pain meds d/ced.    ROS: +TTP LLQ. Denies CP, SOB, N/V.  Objective: Vital Signs: Blood pressure 102/87, pulse 92, temperature 98.6 F (37 C), temperature source Oral, resp. rate 17, weight 67.6 kg (149 lb 0.5 oz), SpO2 90 %. No results found.  Recent Labs  06/17/15 0630 06/18/15 0500  WBC 5.7 6.5  HGB 8.7* 8.7*  HCT 28.1* 28.3*  PLT 320 330    Recent Labs  06/18/15 0500 06/19/15 0500  NA 137 137  K 2.6* 3.4*  CL 98* 99*  GLUCOSE 98 103*  BUN 9 9  CREATININE 0.94 0.90  CALCIUM 7.8* 8.0*   CBG (last 3)   Recent Labs  06/18/15 0652 06/18/15 1639 06/19/15 0624  GLUCAP 93 149* 92    Wt Readings from Last 3 Encounters:  06/19/15 67.6 kg (149 lb 0.5 oz)  06/14/15 73.846 kg (162 lb 12.8 oz)  05/25/15 56.518 kg (124 lb 9.6 oz)    Physical Exam:  Constitutional: She appears well-developed and well-nourished.  HENT: dentition fair Head: Normocephalic and atraumatic.  Eyes: Conjunctivae and EOM are normal.  Neck: Normal range of motion. Neck supple. No thyromegaly present.  Cardiovascular: +Murmur. Irregularly irregular Respiratory: Effort normal and breath sounds normal. No respiratory distress.  GI: Soft. Bowel sounds are normal.  Colostomy in place. Abdominal incision c/d/i. Musculoskeletal: She exhibits edema (improved is LUE, still presents in RUE and b/l LE). She exhibits no tenderness.  Neurological: She is alert. Sitting EOB with good sitting balance A&O2 HOH Motor: Bilateral upper extremities 5/5 proximal to distal Left lower extremity: Hip flexion, knee extension 4/5, ankle dorsi/plantar flexion were 5/5 Right lower extremity: Hip flexion knee extension 4/5, ankle dorsi/plantarflexion 5/5   Skin: Skin is warm and dry.  Psychiatric: She has a normal mood and affect. Her behavior is normal  Assessment/Plan: 1. Debility secondary to perforated sigmoid colon/colectomy/colostomy which require 3+ hours per day of interdisciplinary therapy in a comprehensive inpatient rehab setting. Physiatrist is providing close team supervision and 24 hour management of active medical problems listed below. Physiatrist and rehab team continue to assess barriers to discharge/monitor patient progress toward functional and medical goals.  Function:  Bathing Bathing position   Position: Wheelchair/chair at sink  Bathing parts Body parts bathed by patient: Right arm, Left arm, Chest, Abdomen, Front perineal area, Right upper leg, Left upper leg, Buttocks Body parts bathed by helper: Right lower leg, Left lower leg, Back  Bathing assist Assist Level: Touching or steadying assistance(Pt > 75%)      Upper Body Dressing/Undressing Upper body dressing   What is the patient wearing?: Pull over shirt/dress     Pull over shirt/dress - Perfomed by patient: Thread/unthread right sleeve, Thread/unthread left sleeve, Put head through opening, Pull shirt over trunk Pull over shirt/dress - Perfomed by helper: Pull shirt over trunk        Upper body assist Assist Level: Touching or steadying assistance(Pt > 75%)      Lower Body Dressing/Undressing Lower body dressing   What is the patient wearing?: Pants     Pants- Performed by patient: Thread/unthread right pants leg, Thread/unthread left pants leg, Pull pants up/down Pants- Performed by helper: Thread/unthread right pants leg, Thread/unthread left  pants leg, Pull pants up/down   Non-skid slipper socks- Performed by helper: Don/doff right sock, Don/doff left sock                  Lower body assist Assist for lower body dressing: Touching or steadying assistance (Pt > 75%)      Toileting Toileting   Toileting steps completed by patient:  Adjust clothing prior to toileting, Performs perineal hygiene, Adjust clothing after toileting Toileting steps completed by helper: Adjust clothing prior to toileting, Adjust clothing after toileting Toileting Assistive Devices: Grab bar or rail  Toileting assist Assist level: Supervision or verbal cues   Transfers Chair/bed transfer   Chair/bed transfer method: Stand pivot, Squat pivot Chair/bed transfer assist level: Touching or steadying assistance (Pt > 75%) Chair/bed transfer assistive device: Armrests, Medical sales representative     Max distance: 75 Assist level: Supervision or verbal cues   Wheelchair   Type: Manual Max wheelchair distance: 100 Assist Level: Supervision or verbal cues  Cognition Comprehension Comprehension assist level: Understands complex 90% of the time/cues 10% of the time  Expression Expression assist level: Expresses basic needs/ideas: With no assist  Social Interaction Social Interaction assist level: Interacts appropriately with others with medication or extra time (anti-anxiety, antidepressant).  Problem Solving Problem solving assist level: Solves basic 90% of the time/requires cueing < 10% of the time  Memory Memory assist level: More than reasonable amount of time   Medical Problem List and Plan: 1. Debilitation secondary to perforated diverticulitis status post sigmoid colectomy, descending colostomy with small bowel resection placement of pelvic abscess drain [id number] complicated by GI bleed and ileus -continue CIR 2. DVT Prophylaxis/Anticoagulation: Coumadin resumed  INR subtherapeutic, appreciate pharmacy assistance  Venous doppler ordered for RUE on 5/24 3. Pain Management: Tylenol as needed 4. Acute blood loss anemia/GI bleed.   hgb 8.7 on 5/23 5. Neuropsych: This patient is capable of making decisions on her own behalf. 6. Skin/Wound Care: Routine skin checks/colostomy education and skin care -wounds  clean/intact, ostomy is competent  7. Fluids/Electrolytes/Nutrition: Routine I&O's  -Hypokalemia: 3.4 on 5/24, repleting  -Hypomagnesemia: 1.6 on 5/24, IV mag ordered for 5/23, scheduled PO Mag on 5/24 8. C. Dif: Per ID - Continued vancomycin and Zosyn through 06/14/2015 and then PO taper with vancomycin as directed then began vancomycin 125 mg twice a day 1 week then 125 mg daily 1 week then 125 mg every other day for 2 weeks and stop -contact precautions continue 9.HTN.Verapamil 120 mg daily. 10. Atrial fibrillation. Tachybradycardia syndrome with pacemaker. Cardiac rate control. Coumadin discontinued due to GI bleed. 11. Chronic renal insufficiency. Baseline creatinine 1.68. Follow-up chemistries  -Cr. 0.90 on 5/24 12. Chronic diastolic congestive heart failure.Demadex 80 mg daily. Monitor for any signs of fluid overload. Weighing patient daily   -weight 67kg today, stable  -Will cont to monitor 13.C. difficile positive. Contact precautions 14. Diabetes mellitus. Diet controlled. Hemoglobin A1c 6.2. Sliding scale insulin. Check blood sugars before meals and at bedtime  LOS (Days) 5 A FACE TO FACE EVALUATION WAS PERFORMED  Ankit Lorie Phenix 06/19/2015 7:43 AM

## 2015-06-19 NOTE — Progress Notes (Signed)
Posey Pronto, MD notified of vascular ultrasound results to RUE.

## 2015-06-20 ENCOUNTER — Inpatient Hospital Stay (HOSPITAL_COMMUNITY): Payer: Medicare Other | Admitting: Occupational Therapy

## 2015-06-20 ENCOUNTER — Inpatient Hospital Stay (HOSPITAL_COMMUNITY): Payer: Medicare Other | Admitting: Physical Therapy

## 2015-06-20 DIAGNOSIS — I82621 Acute embolism and thrombosis of deep veins of right upper extremity: Secondary | ICD-10-CM

## 2015-06-20 DIAGNOSIS — E43 Unspecified severe protein-calorie malnutrition: Secondary | ICD-10-CM

## 2015-06-20 LAB — BASIC METABOLIC PANEL
ANION GAP: 7 (ref 5–15)
BUN: 11 mg/dL (ref 6–20)
CO2: 30 mmol/L (ref 22–32)
Calcium: 8.1 mg/dL — ABNORMAL LOW (ref 8.9–10.3)
Chloride: 101 mmol/L (ref 101–111)
Creatinine, Ser: 0.88 mg/dL (ref 0.44–1.00)
GFR, EST NON AFRICAN AMERICAN: 56 mL/min — AB (ref >60)
GLUCOSE: 105 mg/dL — AB (ref 65–99)
POTASSIUM: 3.2 mmol/L — AB (ref 3.5–5.1)
Sodium: 138 mmol/L (ref 135–145)

## 2015-06-20 LAB — PROTIME-INR
INR: 1.27 (ref 0.00–1.49)
Prothrombin Time: 16.1 seconds — ABNORMAL HIGH (ref 11.6–15.2)

## 2015-06-20 LAB — GLUCOSE, CAPILLARY
GLUCOSE-CAPILLARY: 106 mg/dL — AB (ref 65–99)
Glucose-Capillary: 101 mg/dL — ABNORMAL HIGH (ref 65–99)

## 2015-06-20 LAB — MAGNESIUM: Magnesium: 1.7 mg/dL (ref 1.7–2.4)

## 2015-06-20 MED ORDER — WARFARIN SODIUM 5 MG PO TABS
5.0000 mg | ORAL_TABLET | Freq: Once | ORAL | Status: AC
  Administered 2015-06-20: 5 mg via ORAL
  Filled 2015-06-20: qty 1

## 2015-06-20 MED ORDER — POTASSIUM CHLORIDE CRYS ER 20 MEQ PO TBCR
20.0000 meq | EXTENDED_RELEASE_TABLET | Freq: Two times a day (BID) | ORAL | Status: DC
  Administered 2015-06-20 – 2015-06-24 (×9): 20 meq via ORAL
  Filled 2015-06-20 (×9): qty 1

## 2015-06-20 NOTE — Progress Notes (Signed)
Occupational Therapy Session Note  Patient Details  Name: [name] MRN: [id number] Date of Birth: 17-Sep-1923  Today's Date: 06/20/2015 OT Individual Time: 1100-1157 and 1500-1530 OT Individual Time Calculation (min): 57 min and 30 min  30 missed minutes in afternoon session secondary to fatigue  Short Term Goals: Week 1:  OT Short Term Goal 1 (Week 1): Pt will groom in standing at sink for  minutes with SBA OT Short Term Goal 2 (Week 1): Pt will bathe self with SBA OT Short Term Goal 3 (Week 1): Pt will dress self with Min assist OT Short Term Goal 4 (Week 1): Pt will transfer to toilet with min assist OT Short Term Goal 5 (Week 1): Pt will transfer to shower with min assist  Skilled Therapeutic Interventions/Progress Updates:  Session 1: Upon entering the room, pt supine in bed with no c/o pain. Upon sitting up on EOB pt reports, "Bring me the potty. I can't walk there. I have to go." Pt transferred with supervision and stand pivot from bed >BSC. Pt performed hygiene with steady assist for balance and then transferred to wheelchair. Pt requesting to do bathing and dressing at sink side from wheelchair. OT demonstrated and educated use of LH reacher in order to increase I with LB dressing. Pt returned demonstration with min verbal cues for proper technique. Pt ambulated with RW and steady assist 10' in room to recliner chair. Call bell and all needed items within reach upon exiting the room.   Session 2: Upon entering the room, pt sleeping in recliner chair. Pt reports extreme fatigue but agrees to attempt therapy. OT educated and demonstrated use of sock aid in order to increase independence with LB dressing. Pt unable to fully pull sock onto foot secondary to increased swelling. Pt requesting to toilet with urgency again. Pt transferred onto [facility] with steady assist and performed clothing management and hygiene with steady assist. Pt returning to bed secondary to fatigue with steady  assistance and min A to get L LE into bed.  B LE's elevated secondary to increased edema. Bed alarm activated. Call bell and all needed items within reach upon exiting the room.   Therapy Documentation Precautions:  Precautions Precautions: Fall Precaution Comments: IV lines and colostomy bag.   Restrictions Weight Bearing Restrictions: No  See Function Navigator for Current Functional Status.   Therapy/Group: Individual Therapy  Phineas Semen 06/20/2015, 12:48 PM

## 2015-06-20 NOTE — Progress Notes (Signed)
Physical Therapy Session Note  Patient Details  Name: [name] MRN: [id number] Date of Birth: 02/24/1923  Today's Date: 06/20/2015 PT Individual Time: 1300-1408 PT Individual Time Calculation (min): 68 min   Short Term Goals: Week 1:  PT Short Term Goal 1 (Week 1): Patient will performed bed mobility with min A from PT PT Short Term Goal 2 (Week 1): Patient will perform WC<>bed transfer with min A and LRAD PT Short Term Goal 3 (Week 1): Patient will perform gait for 90f with RW and min A from PT   Skilled Therapeutic Interventions/Progress Updates:    Pt received resting in recliner with no c/o pain and agreeable to therapy session.  Session focus on strength, activity tolerance, transfers, and gait with RW.    Pt transfers throughout session sit<>stand, pivot, and ambulatory with RW with supervision overall.  Pt requesting to toilet and amb to/from bathroom with supervision and performs hygiene and clothing management with steady assist.  Pt amb to sink and performs hand hygiene with supervision.    Pt propels w/c to and from therapy gym with supervision and multiple rest breaks.  Gait training x110' + 30' with RW and supervision.    PT instructed pt in fall prevention HEP x10 reps bilat LEs for LAQ with 5 second hold, hip abd, hamstring curls, minisquats, and heel/toe raises.  PT instructed pt in UE diagonals with 1# ball seated at edge of therapy mat for UE and core strength.    Pt returned to room at end of session and positioned upright in recliner with call bell in reach and needs met.   Therapy Documentation Precautions:  Precautions Precautions: Fall Precaution Comments: IV lines and colostomy bag.   Restrictions Weight Bearing Restrictions: No   See Function Navigator for Current Functional Status.   Therapy/Group: Individual Therapy  CEarnest ConroyPenven-Crew 06/20/2015, 3:45 PM

## 2015-06-20 NOTE — Patient Care Conference (Signed)
Inpatient RehabilitationTeam Conference and Plan of Care Update Date: 06/19/2015   Time: 2:35 PM    Patient Name: [name]      Medical Record Number: [id number]  Date of Birth: February 25, 1923 Sex: Female         Room/Bed: 4W09C/4W09C-01 Payor Info: Payor: MEDICARE / Plan: MEDICARE PART A AND B / Product Type: *No Product type* /    Admitting Diagnosis: DEBILITY  Admit Date/Time:  06/14/2015  4:11 PM Admission Comments: No comment available   Primary Diagnosis:  Debilitated Principal Problem: Debilitated  Patient Active Problem List   Diagnosis Date Noted  . Severe malnutrition ([place]) 06/20/2015  . Acute deep vein thrombosis (DVT) of brachial vein of right upper extremity ([place])   . Localized edema   . Hypokalemia   . Hypomagnesemia   . Debilitated 06/14/2015  . History of creation of ostomy 06/14/2015  . Abdominal pain   . Chronic diastolic congestive heart failure ([place])   . History of CVA with residual deficit   . Respiratory depression   . Stool culture positive for Clostridium difficile   . On total parenteral nutrition (TPN)   . Urinary retention   . Leukocytosis   . Tachycardia   . Acute blood loss anemia   . AKI (acute kidney injury) ([place])   . Status post partial colectomy   . Ileus, postoperative   . Postoperative pain   . Diverticulitis of large intestine with perforation and abscess without bleeding   . CKD (chronic kidney disease) stage 3, GFR 30-59 ml/min 05/26/2015  . HOH (hard of hearing) 05/26/2015  . Protein-calorie malnutrition, moderate ([place]) 05/26/2015  . Diverticulitis of intestine with perforation and abscess 05/25/2015  . Diverticulosis   . Chronic thoracic back pain   . Acute on chronic diastolic heart failure ([place])   . Perforation of intestine due to diverticulitis of gastrointestinal tract ([place])   . SOB (shortness of breath) 05/01/2015  . Supratherapeutic INR   . Paroxysmal atrial fibrillation (H[facility])   . Essential hypertension, benign   .  Left sided abdominal pain   . Diverticulitis 03/08/2015  . Obesity   . Chronic atrial fibrillation ([place])   . Upper airway cough syndrome   . Dyspnea 05/22/2014  . Tachycardia-bradycardia syndrome with symptomatic bradycardia 01/03/2014  . History of CVA (cerebrovascular accident) 10/25/2013  . Diabetes ([place]) 08/03/2013  . Myofacial muscle pain 10/16/2011  . Back pain, Left Flank 07/01/2011  . Cerebral embolism with cerebral infarction ([place]) 06/12/2011  . CVA (cerebral infarction) 05/20/2011  . Weakness of left side of body 05/16/2011  . Elevated digoxin level 05/16/2011  . Pacemaker 05/21/2010  . Edema 04/24/2010  . Pericardial effusion, Large   . Chronic anticoagulation - Coumadin, CHADS2VASC=7   . HTN (hypertension)     Expected Discharge Date: Expected Discharge Date: 06/26/15  Team Members Present: Physician leading conference: Dr. Delice Lesch Social Worker Present: Lennart Pall, LCSW Nurse Present: Dorien Chihuahua, RN PT Present: Dwyane Dee, PT OT Present: Benay Pillow, OT SLP Present: Weston Anna, SLP PPS Coordinator present : Daiva Nakayama, RN, CRRN     Current Status/Progress Goal Weekly Team Focus  Medical   Debilitation secondary to perforated diverticulitis status post sigmoid colectomy, descending colostomy with small bowel resection placement of pelvic abscess drain complicated by GI bleed and ileus  Improve INR, electrolytes, edema  see above   Bowel/Bladder   colostomy- continent of bladder  management of colostomy min assist  educate colostomy care   Swallow/Nutrition/ Hydration  ADL's   min - mod A for functional transfers, min A bathing, min A toileting, LB dressing, set up UB dressing  supervision - min A overalll  pt/family education, self care retraining, functional transfers/mobility, balance   Mobility   min>supervision overall  supervision>mod I  activity tolerance, ambulation, stairs, transfers   Communication              Safety/Cognition/ Behavioral Observations            Pain   1 vicoden PRN q4hrs.   3 or less  assess pain and medicate as needed and prior to therapy   Skin   colostomy; scabbed area to right lower abdomen; staples CDI OTA to midline lower incision  free of skin breakdown and infection min assist  assess skin q shift and manage colostomy care prn    Rehab Goals Patient on target to meet rehab goals: Yes *See Care Plan and progress notes for long and short-term goals.  Barriers to Discharge: Subtherapeutic INR, hypokalemia, hypomagnesemia, diastolic CHF, edema    Possible Resolutions to Barriers:  Optimize anticoag, replete electrolytes, diuresis, UE doppler ordered    Discharge Planning/Teaching Needs:  Home with family to coordinate 24/7 supervision/ assistance  to be scheduled   Team Discussion:  New colostomy; monitoring labs; edema in UE.  Pt refuses ted hose - MD aware.  Currently min assist with supervision goals.  Pt always a little resistant to start of therapies but does participate.  Ongoing colostomy ed with pt/ family.  Revisions to Treatment Plan:  None   Continued Need for Acute Rehabilitation Level of Care: The patient requires daily medical management by a physician with specialized training in physical medicine and rehabilitation for the following conditions: Daily direction of a multidisciplinary physical rehabilitation program to ensure safe treatment while eliciting the highest outcome that is of practical value to the patient.: Yes Daily medical management of patient stability for increased activity during participation in an intensive rehabilitation regime.: Yes Daily analysis of laboratory values and/or radiology reports with any subsequent need for medication adjustment of medical intervention for : Cardiac problems;Other  [name] 06/20/2015, 3:04 PM

## 2015-06-20 NOTE — Progress Notes (Signed)
Social Work Patient ID: [name], female   DOB: 01/31/1923, 82 y.o.   MRN: [id number]   Lowella Curb, LCSW Social Worker Signed  Patient Care Conference 06/20/2015  3:04 PM    Expand All Collapse All   Inpatient RehabilitationTeam Conference and Plan of Care Update Date: 06/19/2015   Time: 2:35 PM     Patient Name: [name]       Medical Record Number: [id number]  Date of Birth: [birth year] Sex: Female         Room/Bed: 4W09C/4W09C-01 Payor Info: Payor: MEDICARE / Plan: MEDICARE PART A AND B / Product Type: *No Product type* /    Admitting Diagnosis: DEBILITY   Admit Date/Time:  06/14/2015  4:11 PM Admission Comments: No comment available   Primary Diagnosis:  Debilitated Principal Problem: Debilitated    Patient Active Problem List     Diagnosis  Date Noted   .  Severe malnutrition ([place])  06/20/2015   .  Acute deep vein thrombosis (DVT) of brachial vein of right upper extremity ([place])     .  Localized edema     .  Hypokalemia     .  Hypomagnesemia     .  Debilitated  06/14/2015   .  History of creation of ostomy  06/14/2015   .  Abdominal pain     .  Chronic diastolic congestive heart failure ([place])     .  History of CVA with residual deficit     .  Respiratory depression     .  Stool culture positive for Clostridium difficile     .  On total parenteral nutrition (TPN)     .  Urinary retention     .  Leukocytosis     .  Tachycardia     .  Acute blood loss anemia     .  AKI (acute kidney injury) ([place])     .  Status post partial colectomy     .  Ileus, postoperative     .  Postoperative pain     .  Diverticulitis of large intestine with perforation and abscess without bleeding     .  CKD (chronic kidney disease) stage 3, GFR 30-59 ml/min  05/26/2015   .  HOH (hard of hearing)  05/26/2015   .  Protein-calorie malnutrition, moderate ([place])  05/26/2015   .  Diverticulitis of intestine with perforation and abscess  05/25/2015   .  Diverticulosis     .  Chronic  thoracic back pain     .  Acute on chronic diastolic heart failure ([place])     .  Perforation of intestine due to diverticulitis of gastrointestinal tract ([place])     .  SOB (shortness of breath)  05/01/2015   .  Supratherapeutic INR     .  Paroxysmal atrial fibrillation ([facility])     .  Essential hypertension, benign     .  Left sided abdominal pain     .  Diverticulitis  03/08/2015   .  Obesity     .  Chronic atrial fibrillation ([place])     .  Upper airway cough syndrome     .  Dyspnea  05/22/2014   .  Tachycardia-bradycardia syndrome with symptomatic bradycardia  01/03/2014   .  History of CVA (cerebrovascular accident)  10/25/2013   .  Diabetes ([place])  08/03/2013   .  Myofacial muscle pain  10/16/2011   .  Back  pain, Left Flank  07/01/2011   .  Cerebral embolism with cerebral infarction ([place])  06/12/2011   .  CVA (cerebral infarction)  05/20/2011   .  Weakness of left side of body  05/16/2011   .  Elevated digoxin level  05/16/2011   .  Pacemaker  05/21/2010   .  Edema  04/24/2010   .  Pericardial effusion, Large     .  Chronic anticoagulation - Coumadin, CHADS2VASC=7     .  HTN (hypertension)       Expected Discharge Date: Expected Discharge Date: 06/26/15  Team Members Present: Physician leading conference: Dr. Delice Lesch Social Worker Present: Lennart Pall, LCSW Nurse Present: Dorien Chihuahua, RN PT Present: Dwyane Dee, PT OT Present: Benay Pillow, OT SLP Present: Weston Anna, SLP PPS Coordinator present : Daiva Nakayama, RN, CRRN        Current Status/Progress  Goal  Weekly Team Focus   Medical     Debilitation secondary to perforated diverticulitis status post sigmoid colectomy, descending colostomy with small bowel resection placement of pelvic abscess drain complicated by GI bleed and ileus   Improve INR, electrolytes, edema  see above   Bowel/Bladder     colostomy- continent of bladder  management of colostomy min assist   educate colostomy care    Swallow/Nutrition/ Hydration               ADL's     min - mod A for functional transfers, min A bathing, min A toileting, LB dressing, set up UB dressing  supervision - min A overalll  pt/family education, self care retraining, functional transfers/mobility, balance   Mobility     min>supervision overall  supervision>mod I  activity tolerance, ambulation, stairs, transfers    Communication               Safety/Cognition/ Behavioral Observations              Pain     1 vicoden PRN q4hrs.   3 or less  assess pain and medicate as needed and prior to therapy    Skin     colostomy; scabbed area to right lower abdomen; staples CDI OTA to midline lower incision  free of skin breakdown and infection min assist   assess skin q shift and manage colostomy care prn    Rehab Goals Patient on target to meet rehab goals: Yes *See Care Plan and progress notes for long and short-term goals.    Barriers to Discharge:  Subtherapeutic INR, hypokalemia, hypomagnesemia, diastolic CHF, edema     Possible Resolutions to Barriers:   Optimize anticoag, replete electrolytes, diuresis, UE doppler ordered     Discharge Planning/Teaching Needs:   Home with family to coordinate 24/7 supervision/ assistance   to be scheduled    Team Discussion:    New colostomy; monitoring labs; edema in UE.  Pt refuses ted hose - MD aware.  Currently min assist with supervision goals.  Pt always a little resistant to start of therapies but does participate.  Ongoing colostomy ed with pt/ family.   Revisions to Treatment Plan:    None    Continued Need for Acute Rehabilitation Level of Care: The patient requires daily medical management by a physician with specialized training in physical medicine and rehabilitation for the following conditions: Daily direction of a multidisciplinary physical rehabilitation program to ensure safe treatment while eliciting the highest outcome that is of practical value to the patient.:  Yes Daily medical management  of patient stability for increased activity during participation in an intensive rehabilitation regime.: Yes Daily analysis of laboratory values and/or radiology reports with any subsequent need for medication adjustment of medical intervention for : Cardiac problems;Other  Nyia Tsao 06/20/2015, 3:04 PM

## 2015-06-20 NOTE — Progress Notes (Signed)
[facility] PHYSICAL MEDICINE & REHABILITATION     PROGRESS NOTE    Subjective/Complaints: Pt with trouble sleeping overnight .  She states she has this issue at baseline as well. She would like to move the administration of her Lorrin Mais to a later time.  She denies pain after d/cing pain meds.  Family has questions about her WBCs, Hb, INR.   ROS: Denies CP, SOB, N/V.  Objective: Vital Signs: Blood pressure 110/60, pulse 104, temperature 98.2 F (36.8 C), temperature source Oral, resp. rate 17, weight 71.3 kg (157 lb 3 oz), SpO2 94 %. No results found.  Recent Labs  06/18/15 0500  WBC 6.5  HGB 8.7*  HCT 28.3*  PLT 330    Recent Labs  06/19/15 0500 06/20/15 0517  NA 137 138  K 3.4* 3.2*  CL 99* 101  GLUCOSE 103* 105*  BUN 9 11  CREATININE 0.90 0.88  CALCIUM 8.0* 8.1*   CBG (last 3)   Recent Labs  06/19/15 0624 06/19/15 1622 06/20/15 0730  GLUCAP 92 135* 106*    Wt Readings from Last 3 Encounters:  06/20/15 71.3 kg (157 lb 3 oz)  06/14/15 73.846 kg (162 lb 12.8 oz)  05/25/15 56.518 kg (124 lb 9.6 oz)    Physical Exam:  Constitutional: She appears well-developed and well-nourished.  HENT: dentition fair Head: Normocephalic and atraumatic.  Eyes: Conjunctivae and EOM are normal.  Neck: Normal range of motion. Neck supple. No thyromegaly present.  Cardiovascular: +Murmur. Irregularly irregular Respiratory: Effort normal and breath sounds normal. No respiratory distress.  GI: Soft. Bowel sounds are normal.  Colostomy in place. Abdominal incision c/d/i. Musculoskeletal: She exhibits edema (B/l LE, RUE). She exhibits no tenderness.  Neurological: She is alert. Sitting EOB with good sitting balance A&O2 HOH Motor: Bilateral upper extremities 5/5 proximal to distal Left lower extremity: Hip flexion, knee extension 4/5, ankle dorsi/plantar flexion were 5/5 Right lower extremity: Hip flexion knee extension 4/5, ankle dorsi/plantarflexion 5/5  Skin:  Skin is warm and dry.  Psychiatric: She has a normal mood and affect. Her behavior is normal  Assessment/Plan: 1. Debility secondary to perforated sigmoid colon/colectomy/colostomy which require 3+ hours per day of interdisciplinary therapy in a comprehensive inpatient rehab setting. Physiatrist is providing close team supervision and 24 hour management of active medical problems listed below. Physiatrist and rehab team continue to assess barriers to discharge/monitor patient progress toward functional and medical goals.  Function:  Bathing Bathing position   Position: Wheelchair/chair at sink  Bathing parts Body parts bathed by patient: Right arm, Left arm, Chest, Abdomen, Front perineal area, Right upper leg, Left upper leg, Buttocks Body parts bathed by helper: Right lower leg, Left lower leg, Back  Bathing assist Assist Level: Touching or steadying assistance(Pt > 75%)      Upper Body Dressing/Undressing Upper body dressing   What is the patient wearing?: Pull over shirt/dress     Pull over shirt/dress - Perfomed by patient: Thread/unthread right sleeve, Thread/unthread left sleeve, Put head through opening, Pull shirt over trunk Pull over shirt/dress - Perfomed by helper: Pull shirt over trunk        Upper body assist Assist Level: Set up      Lower Body Dressing/Undressing Lower body dressing   What is the patient wearing?: Pants, Underwear Underwear - Performed by patient: Pull underwear up/down Underwear - Performed by helper: Thread/unthread right underwear leg, Thread/unthread left underwear leg Pants- Performed by patient: Pull pants up/down Pants- Performed by helper: Thread/unthread right pants  leg, Thread/unthread left pants leg   Non-skid slipper socks- Performed by helper: Don/doff right sock, Don/doff left sock                  Lower body assist Assist for lower body dressing: Touching or steadying assistance (Pt > 75%)      Toileting Toileting    Toileting steps completed by patient: Adjust clothing prior to toileting, Performs perineal hygiene, Adjust clothing after toileting Toileting steps completed by helper: Adjust clothing prior to toileting, Adjust clothing after toileting Toileting Assistive Devices: Grab bar or rail  Toileting assist Assist level: Touching or steadying assistance (Pt.75%)   Transfers Chair/bed transfer   Chair/bed transfer method: Ambulatory Chair/bed transfer assist level: Touching or steadying assistance (Pt > 75%) Chair/bed transfer assistive device: Armrests, Medical sales representative     Max distance: 30 Assist level: Touching or steadying assistance (Pt > 75%)   Wheelchair   Type: Manual Max wheelchair distance: 100 Assist Level: Supervision or verbal cues  Cognition Comprehension Comprehension assist level: Understands complex 90% of the time/cues 10% of the time  Expression Expression assist level: Expresses basic needs/ideas: With no assist  Social Interaction Social Interaction assist level: Interacts appropriately with others with medication or extra time (anti-anxiety, antidepressant).  Problem Solving Problem solving assist level: Solves basic 90% of the time/requires cueing < 10% of the time  Memory Memory assist level: More than reasonable amount of time   Medical Problem List and Plan: 1. Debilitation secondary to perforated diverticulitis status post sigmoid colectomy, descending colostomy with small bowel resection placement of pelvic abscess drain 123XX123 complicated by GI bleed and ileus -continue CIR 2. DVT Prophylaxis/Anticoagulation: Coumadin resumed  INR subtherapeutic, appreciate pharmacy assistance  Venous doppler ordered for RUE showing DVT on 5/24 - on coumadin, will be cautious due to recent GI bleed 3. Pain Management: Tylenol as needed 4. Acute blood loss anemia/GI bleed.   hgb 8.7 on 5/23 5. Neuropsych: This patient is capable of making  decisions on her own behalf. 6. Skin/Wound Care: Routine skin checks/colostomy education and skin care -wounds clean/intact, ostomy is competent  7. Fluids/Electrolytes/Nutrition: Routine I&O's  -Hypokalemia: 3.2 on 5/25, repleting, will increase today.  -Hypomagnesemia: IV mag ordered for 5/23, scheduled PO Mag on 5/24, 1.7 on 5/25 8. C. Dif: Per ID - Continued vancomycin and Zosyn through 06/14/2015 and then PO taper with vancomycin as directed then began vancomycin 125 mg twice a day 1 week then 125 mg daily 1 week then 125 mg every other day for 2 weeks and stop -contact precautions continue 9.HTN.Verapamil 120 mg daily. 10. Atrial fibrillation. Tachybradycardia syndrome with pacemaker. Cardiac rate control. Coumadin discontinued due to GI bleed. 11. ?Chronic renal insufficiency. Baseline creatinine 1.68. Follow-up chemistries  -Cr. 0.88 on 5/25 12. Chronic diastolic congestive heart failure.Demadex 80 mg daily. Monitor for any signs of fluid overload. Weighing patient daily   -weight 67kg 5/24, 71 today, clinically stable  -Will cont to monitor 13.C. difficile positive. Contact precautions 14. Diabetes mellitus. Diet controlled. Hemoglobin A1c 6.2. Sliding scale insulin. Check blood sugars before meals and at bedtime  LOS (Days) 6 A FACE TO FACE EVALUATION WAS PERFORMED  Siria Calandro Lorie Phenix 06/20/2015 8:30 AM

## 2015-06-20 NOTE — Progress Notes (Signed)
ANTICOAGULATION CONSULT NOTE - Follow Up Consult  Pharmacy Consult for coumadin Indication: atrial fibrillation  and hx of stroke   Allergies  Allergen Reactions  . Anti-Inflammatory Enzyme [Nutritional Supplements] Other (See Comments)    Retains fluids and headaches  . Iodinated Diagnostic Agents Hives    HIVES 15MIN S/P IV CONTRAST INJECTION,WILL NEED 13 HR PREP FOR FUTURE INJECTIONS, ok s/p 50mg  po benadryl//a.calhoun  . Spironolactone Other (See Comments)    Hair loss  . Arthrotec [Diclofenac-Misoprostol] Other (See Comments)    unknown  . Biaxin [Clarithromycin] Other (See Comments)    unknown  . Plavix [Clopidogrel Bisulfate] Other (See Comments)    unknown    Patient Measurements: Weight: 157 lb 3 oz (71.3 kg) Heparin Dosing Weight:   Vital Signs: Temp: 98.2 F (36.8 C) (05/25 0601) Temp Source: Oral (05/25 0601) BP: 110/60 mmHg (05/25 0601) Pulse Rate: 104 (05/25 0601)  Labs:  Recent Labs  06/18/15 0500 06/19/15 0500 06/20/15 0517  HGB 8.7*  --   --   HCT 28.3*  --   --   PLT 330  --   --   LABPROT 15.7* 14.8 16.1*  INR 1.24 1.14 1.27  CREATININE 0.94 0.90 0.88    Estimated Creatinine Clearance: 42.1 mL/min (by C-G formula based on Cr of 0.88).   Medications:  Scheduled:  . digoxin  0.0625 mg Oral Daily  . guaiFENesin  200 mg Oral BID  . insulin aspart  0-9 Units Subcutaneous TID WC  . magnesium gluconate  500 mg Oral Daily  . pantoprazole  40 mg Oral Daily  . potassium chloride  40 mEq Oral Daily  . protein supplement  1 scoop Oral TID WC  . saccharomyces boulardii  250 mg Oral BID  . sodium chloride flush  10-40 mL Intracatheter Q12H  . torsemide  40 mg Oral Daily  . vancomycin  125 mg Oral Q12H   Followed by  . [START ON 06/24/2015] vancomycin  125 mg Oral Daily   Followed by  . [START ON 07/01/2015] vancomycin  125 mg Oral Q48H  . verapamil  120 mg Oral Daily  . Warfarin - Pharmacist Dosing Inpatient   Does not apply q1800  . zolpidem   2.5 mg Oral QHS   Infusions:    Assessment: 82 yo female with atrial fibrillation  and hx of stroke is currently on subtherapeutic coumadin.  INR today is 1.27.  Goal of Therapy:  INR 2-3 Monitor platelets by anticoagulation protocol: Yes   Plan: Coumadin 5 mg po x 1 Daily PT/INR.   Dewain Platz, Tsz-Yin 06/20/2015,8:25 AM

## 2015-06-21 ENCOUNTER — Inpatient Hospital Stay (HOSPITAL_COMMUNITY): Payer: Medicare Other | Admitting: Occupational Therapy

## 2015-06-21 ENCOUNTER — Inpatient Hospital Stay (HOSPITAL_COMMUNITY): Payer: Medicare Other | Admitting: Physical Therapy

## 2015-06-21 LAB — BASIC METABOLIC PANEL
ANION GAP: 5 (ref 5–15)
BUN: 12 mg/dL (ref 6–20)
CO2: 31 mmol/L (ref 22–32)
Calcium: 8.1 mg/dL — ABNORMAL LOW (ref 8.9–10.3)
Chloride: 102 mmol/L (ref 101–111)
Creatinine, Ser: 0.85 mg/dL (ref 0.44–1.00)
GFR calc Af Amer: >60 mL/min (ref >60)
GFR, EST NON AFRICAN AMERICAN: 59 mL/min — AB (ref >60)
GLUCOSE: 98 mg/dL (ref 65–99)
POTASSIUM: 3.6 mmol/L (ref 3.5–5.1)
Sodium: 138 mmol/L (ref 135–145)

## 2015-06-21 LAB — GLUCOSE, CAPILLARY
GLUCOSE-CAPILLARY: 120 mg/dL — AB (ref 65–99)
Glucose-Capillary: 82 mg/dL (ref 65–99)

## 2015-06-21 LAB — PROTIME-INR
INR: 1.25 (ref 0.00–1.49)
Prothrombin Time: 15.9 seconds — ABNORMAL HIGH (ref 11.6–15.2)

## 2015-06-21 LAB — MAGNESIUM: Magnesium: 1.6 mg/dL — ABNORMAL LOW (ref 1.7–2.4)

## 2015-06-21 MED ORDER — MAGNESIUM SULFATE 4 GM/100ML IV SOLN
4.0000 g | Freq: Once | INTRAVENOUS | Status: AC
  Administered 2015-06-21: 4 g via INTRAVENOUS
  Filled 2015-06-21: qty 100

## 2015-06-21 MED ORDER — WARFARIN SODIUM 5 MG PO TABS
5.0000 mg | ORAL_TABLET | Freq: Once | ORAL | Status: AC
  Administered 2015-06-21: 5 mg via ORAL
  Filled 2015-06-21: qty 1

## 2015-06-21 NOTE — Progress Notes (Signed)
Occupational Therapy Session Note  Patient Details  Name: SHREEYA GAVITT MRN: [id number] Date of Birth: 1924/01/20  Today's Date: 06/21/2015 OT Individual Time:  - 0945-1100  (75 min)      Short Term Goals: Week 1:  OT Short Term Goal 1 (Week 1): Pt will groom in standing at sink for  minutes with SBA OT Short Term Goal 2 (Week 1): Pt will bathe self with SBA OT Short Term Goal 3 (Week 1): Pt will dress self with Min assist OT Short Term Goal 4 (Week 1): Pt will transfer to toilet with min assist OT Short Term Goal 5 (Week 1): Pt will transfer to shower with min assist Week 2:    Week 3:     Skilled Therapeutic Interventions/Progress Updates:    Pt in recliner.  3/10 pain in her left lower quadrant below ostomy.  Pt agreed to particpate in therapy.  Afdressed  Functional mobility, transfers, balance, endurance, activity tolerance.  Ppt ambulated to toilet and transferred with grab bar and SBA.  She emptied ostomy bag with set up and min assist with cleaning.  She ambulated to sink and washed hands with cues for using sink for balance.    Ppt propelled wc to day room.  Ambulated with RW with min to SBA.  She watered plants with no LOB, but needed to rest after walking 10-12 feet.  She ambulated after 5 minutes rest and completed task and ambulated 50 feet before resting again.  Propelled wc back to room and ambulated to recliner and left with all needs in reach.    Therapy Documentation Precautions:  Precautions Precautions: Fall Precaution Comments: IV lines and colostomy bag.   Restrictions Weight Bearing Restrictions: No General:     Pain:  3/10  Left abdomen           See Function Navigator for Current Functional Status.   Therapy/Group: Individual Therapy  Lisa Roca 06/21/2015, 10:09 AM

## 2015-06-21 NOTE — Progress Notes (Signed)
Physical Therapy Session Note  Patient Details  Name: [name] MRN: [id number] Date of Birth: 02/26/1923  Today's Date: 06/21/2015 PT Individual Time: 0800-0900 PT Individual Time Calculation (min): 60 min   Short Term Goals: Week 1:  PT Short Term Goal 1 (Week 1): Patient will performed bed mobility with min A from PT PT Short Term Goal 2 (Week 1): Patient will perform WC<>bed transfer with min A and LRAD PT Short Term Goal 3 (Week 1): Patient will perform gait for 1ft with RW and min A from PT   Skilled Therapeutic Interventions/Progress Updates:    Patient received supine in bed and agreeable to PT.  Bed mobility for Roll to L as well as supine to sit and with supervision A and heavy use of bed features. PT provided min cues for improved LE positioning to improved use of momentum for increased ease of transfer  Gait training for 166ft, 94ft and 56ft with RW and supervision A. PT provided occasional cues to improve step length bilaterally and improve posture to allow RW to remain closer to COM.  Stair training on 8 steps, 6 inches each with BUE support and supervision A from PT. Mod verbal instruction for proper step to gait patten and UE placement to prevent anterior LOB.   [place] level A balance exercises x 10 BLE including hip abduction, knee flexion, sit<>stand, long arc quad set, mini sqaut and tandem stance with BUE support 2x 10 seconds.   Furniture transfer to [employer] with Supervision A from PT with use of RW. Patient left sitting in recliner with call bell within reach.    Therapy Documentation Precautions:  Precautions Precautions: Fall Precaution Comments: IV lines and colostomy bag.   Restrictions Weight Bearing Restrictions: No   See Function Navigator for Current Functional Status.   Therapy/Group: Individual Therapy  Lorie Phenix 06/21/2015, 10:01 AM

## 2015-06-21 NOTE — Progress Notes (Signed)
Physical Therapy Session Note  Patient Details  Name: [name] MRN: [id number] Date of Birth: September 07, 1923  Today's Date: 06/21/2015 PT Individual Time: 1400-1500 PT Individual Time Calculation (min): 60 min   Short Term Goals: Week 1:  PT Short Term Goal 1 (Week 1): Patient will performed bed mobility with min A from PT PT Short Term Goal 2 (Week 1): Patient will perform WC<>bed transfer with min A and LRAD PT Short Term Goal 3 (Week 1): Patient will perform gait for 52ft with RW and min A from PT   Skilled Therapeutic Interventions/Progress Updates:    Pt received seated in recliner, denies pain and agreeable to treatment. Gait to gym 1x125', 1x75' with seated rest break between trials due to fatigue. Standing balance with dynamic reaching to variable heights with min guard while performing card matching task. Requires several seated rest breaks due to LE fatigue. Stairs 2 trials x4 stairs on 6-inch height with B handrails and min guard. Pt declined to ambulate back to room due to excessive fatigue; transported [name] in w/c. Stand pivot transfer w/c >BSC at bedside with min guard and RW. Performed all clothing management and hygiene with S. Gait to recliner with RW and min guard. Remained seated in recliner with family present and all needs in reach at completion of session.   Therapy Documentation Precautions:  Precautions Precautions: Fall Precaution Comments: IV lines and colostomy bag.   Restrictions Weight Bearing Restrictions: No Pain: Pain Assessment Pain Assessment: No/denies pain   See Function Navigator for Current Functional Status.   Therapy/Group: Individual Therapy  Luberta Mutter 06/21/2015, 3:12 PM

## 2015-06-21 NOTE — Progress Notes (Signed)
[facility] PHYSICAL MEDICINE & REHABILITATION     PROGRESS NOTE    Subjective/Complaints: Patient initially getting dressed on initial evaluation, on reevaluation patient sitting in her chair about to work with physical therapy. She states she slept better last night with the change in timing of her Ambien. She notes discomfort with compression stockings. Her family again has questions about her WBCs and hemoglobin.  ROS: Denies CP, SOB, N/V.  Objective: Vital Signs: Blood pressure 100/53, pulse 90, temperature 98.3 F (36.8 C), temperature source Oral, resp. rate 17, weight 68.4 kg (150 lb 12.7 oz), SpO2 94 %. No results found. No results for input(s): WBC, HGB, HCT, PLT in the last 72 hours.  Recent Labs  06/20/15 0517 06/21/15 0500  NA 138 138  K 3.2* 3.6  CL 101 102  GLUCOSE 105* 98  BUN 11 12  CREATININE 0.88 0.85  CALCIUM 8.1* 8.1*   CBG (last 3)   Recent Labs  06/20/15 0730 06/20/15 1645 06/21/15 0649  GLUCAP 106* 101* 82    Wt Readings from Last 3 Encounters:  06/21/15 68.4 kg (150 lb 12.7 oz)  06/14/15 73.846 kg (162 lb 12.8 oz)  05/25/15 56.518 kg (124 lb 9.6 oz)    Physical Exam:  Constitutional: She appears well-developed and well-nourished.  HENT: dentition fair Head: Normocephalic and atraumatic.  Eyes: Conjunctivae and EOM are normal.  Neck: Normal range of motion. Neck supple. No thyromegaly present.  Cardiovascular: +Murmur. Irregularly irregular Respiratory: Effort normal and breath sounds normal. No respiratory distress.  GI: Soft. Bowel sounds are normal.  Colostomy in place. Abdominal incision c/d/i. Musculoskeletal: She exhibits edema (B/l LE, RUE). She exhibits no tenderness.  Neurological: She is alert. Sitting EOB with good sitting balance A&O2 HOH Motor: Bilateral upper extremities 5/5 proximal to distal Left lower extremity: Hip flexion, knee extension 4/5, ankle dorsi/plantar flexion were 5/5 Right lower extremity: Hip  flexion knee extension 4/5, ankle dorsi/plantarflexion 5/5  Skin: Skin is warm and dry.  Psychiatric: She has a normal mood and affect. Her behavior is normal  Assessment/Plan: 1. Debility secondary to perforated sigmoid colon/colectomy/colostomy which require 3+ hours per day of interdisciplinary therapy in a comprehensive inpatient rehab setting. Physiatrist is providing close team supervision and 24 hour management of active medical problems listed below. Physiatrist and rehab team continue to assess barriers to discharge/monitor patient progress toward functional and medical goals.  Function:  Bathing Bathing position   Position: Wheelchair/chair at sink  Bathing parts Body parts bathed by patient: Right arm, Left arm, Chest, Abdomen, Front perineal area, Right upper leg, Left upper leg, Buttocks Body parts bathed by helper: Right lower leg, Left lower leg  Bathing assist Assist Level: Touching or steadying assistance(Pt > 75%)      Upper Body Dressing/Undressing Upper body dressing   What is the patient wearing?: Pull over shirt/dress     Pull over shirt/dress - Perfomed by patient: Thread/unthread right sleeve, Thread/unthread left sleeve, Put head through opening, Pull shirt over trunk Pull over shirt/dress - Perfomed by helper: Pull shirt over trunk        Upper body assist Assist Level: Set up   Set up : To obtain clothing/put away  Lower Body Dressing/Undressing Lower body dressing   What is the patient wearing?: Pants, Underwear Underwear - Performed by patient: Pull underwear up/down, Thread/unthread right underwear leg, Thread/unthread left underwear leg Underwear - Performed by helper: Thread/unthread right underwear leg, Thread/unthread left underwear leg Pants- Performed by patient: Pull pants up/down  Pants- Performed by helper: Thread/unthread right pants leg, Thread/unthread left pants leg   Non-skid slipper socks- Performed by helper: Don/doff right sock,  Don/doff left sock                  Lower body assist Assist for lower body dressing: Touching or steadying assistance (Pt > 75%), Assistive device Assistive Device Comment: reacher    Toileting Toileting   Toileting steps completed by patient: Adjust clothing prior to toileting, Performs perineal hygiene, Adjust clothing after toileting Toileting steps completed by helper: Adjust clothing prior to toileting, Adjust clothing after toileting Toileting Assistive Devices: Other (comment) (BSC)  Toileting assist Assist level: Touching or steadying assistance (Pt.75%)   Transfers Chair/bed transfer   Chair/bed transfer method: Ambulatory Chair/bed transfer assist level: Supervision or verbal cues Chair/bed transfer assistive device: Armrests, Medical sales representative     Max distance: 110 Assist level: Supervision or verbal cues   Wheelchair   Type: Manual Max wheelchair distance: 100 Assist Level: Supervision or verbal cues  Cognition Comprehension Comprehension assist level: Understands complex 90% of the time/cues 10% of the time  Expression Expression assist level: Expresses basic needs/ideas: With no assist  Social Interaction Social Interaction assist level: Interacts appropriately with others with medication or extra time (anti-anxiety, antidepressant).  Problem Solving Problem solving assist level: Solves complex 90% of the time/cues < 10% of the time, Solves basic 90% of the time/requires cueing < 10% of the time  Memory Memory assist level: More than reasonable amount of time   Medical Problem List and Plan: 1. Debilitation secondary to perforated diverticulitis status post sigmoid colectomy, descending colostomy with small bowel resection placement of pelvic abscess drain 123XX123 complicated by GI bleed and ileus -continue CIR 2. DVT Prophylaxis/Anticoagulation: Coumadin resumed  INR subtherapeutic, appreciate pharmacy assistance  Venous  doppler ordered for RUE showing DVT on 5/24 - on coumadin, will be cautious due to recent GI bleed 3. Pain Management: Tylenol as needed 4. Acute blood loss anemia/GI bleed.   hgb 8.7 on 5/23 5. Neuropsych: This patient is capable of making decisions on her own behalf. 6. Skin/Wound Care: Routine skin checks/colostomy education and skin care -wounds clean/intact, ostomy is competent  7. Fluids/Electrolytes/Nutrition: Routine I&O's  -Hypokalemia:3.6 on 5/26, repleting.  -Hypomagnesemia: IV mag ordered for 5/23 and again on 5/26, scheduled PO Mag on 5/24, 1.6 on 5/26 8. C. Dif: Per ID - Continued vancomycin and Zosyn through 06/14/2015 and then PO taper with vancomycin as directed then began vancomycin 125 mg twice a day 1 week then 125 mg daily 1 week then 125 mg every other day for 2 weeks and stop -contact precautions continue 9.HTN.Verapamil 120 mg daily. 10. Atrial fibrillation. Tachybradycardia syndrome with pacemaker. Cardiac rate control. Coumadin discontinued due to GI bleed. 11. ?Chronic renal insufficiency, although this appears less likely given normal ranges of creatinine and hospital.  -Cr. 0.85 on 5/26 12. Chronic diastolic congestive heart failure.Demadex 80 mg daily. Monitor for any signs of fluid overload. Weighing patient daily   -Appears to be stable around 67-68 KG's, however slightly labile recordings  -Will cont to monitor 13.C. difficile positive. Contact precautions 14. Diabetes mellitus. Diet controlled. Hemoglobin A1c 6.2. Sliding scale insulin. Check blood sugars before meals and at bedtime  LOS (Days) 7 A FACE TO FACE EVALUATION WAS PERFORMED  Samella Lucchetti Lorie Phenix 06/21/2015 9:12 AM

## 2015-06-21 NOTE — Progress Notes (Signed)
Social Work Patient ID: [name], female   DOB: 14-Jan-1924, 82 y.o.   MRN: [id number]   Met yesterday and today with pt and with daughter, [name], present today.  Both aware and agreeable with targeted d/c date of 5/31 with supervision goals. Daughter reports that she will likely d/c to niece's home but that they are meeting to confirm this plan today.  Daughter assures that they will have 24/7 assist in place.  Pt reports that she began ostomy care yesterday and states, "no problem".  Will follow up on Monday.  Odin Mariani, LCSW

## 2015-06-21 NOTE — Progress Notes (Signed)
ANTICOAGULATION CONSULT NOTE - Follow Up Consult  Pharmacy Consult for coumadin Indication: hx of storke and afib   Allergies  Allergen Reactions  . Anti-Inflammatory Enzyme [Nutritional Supplements] Other (See Comments)    Retains fluids and headaches  . Iodinated Diagnostic Agents Hives    HIVES 15MIN S/P IV CONTRAST INJECTION,WILL NEED 13 HR PREP FOR FUTURE INJECTIONS, ok s/p 50mg  po benadryl//a.calhoun  . Spironolactone Other (See Comments)    Hair loss  . Arthrotec [Diclofenac-Misoprostol] Other (See Comments)    unknown  . Biaxin [Clarithromycin] Other (See Comments)    unknown  . Plavix [Clopidogrel Bisulfate] Other (See Comments)    unknown    Patient Measurements: Weight: 150 lb 12.7 oz (68.4 kg) Heparin Dosing Weight:   Vital Signs: Temp: 98.3 F (36.8 C) (05/26 0559) Temp Source: Oral (05/26 0559) BP: 100/53 mmHg (05/26 0559) Pulse Rate: 90 (05/26 0559)  Labs:  Recent Labs  06/19/15 0500 06/20/15 0517 06/21/15 0500  LABPROT 14.8 16.1* 15.9*  INR 1.14 1.27 1.25  CREATININE 0.90 0.88 0.85    Estimated Creatinine Clearance: 42.8 mL/min (by C-G formula based on Cr of 0.85).   Medications:  Scheduled:  . digoxin  0.0625 mg Oral Daily  . guaiFENesin  200 mg Oral BID  . insulin aspart  0-9 Units Subcutaneous TID WC  . magnesium gluconate  500 mg Oral Daily  . pantoprazole  40 mg Oral Daily  . potassium chloride  20 mEq Oral BID  . protein supplement  1 scoop Oral TID WC  . saccharomyces boulardii  250 mg Oral BID  . sodium chloride flush  10-40 mL Intracatheter Q12H  . torsemide  40 mg Oral Daily  . vancomycin  125 mg Oral Q12H   Followed by  . [START ON 06/24/2015] vancomycin  125 mg Oral Daily   Followed by  . [START ON 07/01/2015] vancomycin  125 mg Oral Q48H  . verapamil  120 mg Oral Daily  . Warfarin - Pharmacist Dosing Inpatient   Does not apply q1800  . zolpidem  2.5 mg Oral QHS   Infusions:    Assessment: 82 yo female with hx of stroke  and afib is currently on subtherapeutic coumadin.  INR today is 1.25.  Was previously on 2.5 mg daily except 5 mg on T/Th?su  Goal of Therapy:  INR 2-3 Monitor platelets by anticoagulation protocol: Yes   Plan:  - coumadin 5 mg po x1 - INR in am  Jakorey Mcconathy, Tsz-Yin 06/21/2015,8:17 AM

## 2015-06-22 ENCOUNTER — Inpatient Hospital Stay (HOSPITAL_COMMUNITY): Payer: Medicare Other | Admitting: Occupational Therapy

## 2015-06-22 LAB — GLUCOSE, CAPILLARY
Glucose-Capillary: 99 mg/dL (ref 65–99)
Glucose-Capillary: 109 mg/dL — ABNORMAL HIGH (ref 65–99)

## 2015-06-22 LAB — MAGNESIUM: MAGNESIUM: 2.3 mg/dL (ref 1.7–2.4)

## 2015-06-22 LAB — PROTIME-INR
INR: 1.42 (ref 0.00–1.49)
Prothrombin Time: 17.5 seconds — ABNORMAL HIGH (ref 11.6–15.2)

## 2015-06-22 MED ORDER — WARFARIN SODIUM 5 MG PO TABS
5.0000 mg | ORAL_TABLET | Freq: Once | ORAL | Status: AC
  Administered 2015-06-22: 5 mg via ORAL
  Filled 2015-06-22: qty 1

## 2015-06-22 NOTE — Progress Notes (Signed)
Occupational Therapy Session Note  Patient Details  Name: [name] MRN: [id number] Date of Birth: May 17, 1923  Today's Date: 06/22/2015 OT Individual Time: 0915-1000 OT Individual Time Calculation (min): 45 min    Short Term Goals: Week 1:  OT Short Term Goal 1 (Week 1): Pt will groom in standing at sink for  minutes with SBA OT Short Term Goal 2 (Week 1): Pt will bathe self with SBA OT Short Term Goal 3 (Week 1): Pt will dress self with Min assist OT Short Term Goal 4 (Week 1): Pt will transfer to toilet with min assist OT Short Term Goal 5 (Week 1): Pt will transfer to shower with min assist      Skilled Therapeutic Interventions/Progress Updates:    Pt seen this session to address activity tolerance and dynamic balance. Pt bathed at sink today and donned clothing with S.  She ambulated to toilet (BSC over toilet) with RW with S.   Pt agreeable to working on endurance with ambulation. Daughter [name] present pushing w/c behind pt.  Pt ambulated 20-25 ft at a time, 4x with short rest breaks in between. Daughter asking if she could walk pt in the room. Daughter practiced walking her mother with RW from day room back to room with one cue to walk less than an arms length from the pt.  Once in the room, daughter assisted pt to walk to recliner with RW to sit.  Pt does not have further therapies today, so instructed daughter if mom wants to do some walking in the hallway that is fine as long as her spouse is available to push w/c behind pt. Pt tends to fatigue quickly.  Safety plan updated to indicate daughter can walk with the patient to prepare for their upcoming discharge.   Therapy Documentation Precautions:  Precautions Precautions: Fall Precaution Comments: IV lines and colostomy bag.   Restrictions Weight Bearing Restrictions: No  Pain: Pain Assessment Pain Assessment: No/denies pain ADL:   See Function Navigator for Current Functional Status.   Therapy/Group: Individual  Therapy  SAGUIER,JULIA 06/22/2015, 12:56 PM

## 2015-06-22 NOTE — Progress Notes (Signed)
[place] PHYSICAL MEDICINE & REHABILITATION     PROGRESS NOTE    Subjective/Complaints: No issues overnight  ROS: Denies CP, SOB, N/V.  Objective: Vital Signs: Blood pressure 118/70, pulse 107, temperature 98 F (36.7 C), temperature source Oral, resp. rate 17, weight 67.4 kg (148 lb 9.4 oz), SpO2 94 %. No results found. No results for input(s): WBC, HGB, HCT, PLT in the last 72 hours.  Recent Labs  06/20/15 0517 06/21/15 0500  NA 138 138  K 3.2* 3.6  CL 101 102  GLUCOSE 105* 98  BUN 11 12  CREATININE 0.88 0.85  CALCIUM 8.1* 8.1*   CBG (last 3)   Recent Labs  06/21/15 0649 06/21/15 1643 06/22/15 0657  GLUCAP 82 120* 99    Wt Readings from Last 3 Encounters:  06/22/15 67.4 kg (148 lb 9.4 oz)  06/14/15 73.846 kg (162 lb 12.8 oz)  05/25/15 56.518 kg (124 lb 9.6 oz)    Physical Exam:  Constitutional: She appears well-developed and well-nourished.  HENT: dentition fair Head: Normocephalic and atraumatic.  Eyes: Conjunctivae and EOM are normal.  Neck: Normal range of motion. Neck supple. No thyromegaly present.  Cardiovascular: +Murmur. Irregularly irregular Respiratory: Effort normal and breath sounds normal. No respiratory distress.  GI: Soft. Bowel sounds are normal.  Colostomy in place. Abdominal incision c/d/i. Musculoskeletal: She exhibits edema (B/l LE, RUE). She exhibits no tenderness.  Neurological: She is alert. Sitting EOB with good sitting balance A&O2 HOH Motor: Bilateral upper extremities 5/5 proximal to distal Left lower extremity: Hip flexion, knee extension 4/5, ankle dorsi/plantar flexion were 5/5 Right lower extremity: Hip flexion knee extension 4/5, ankle dorsi/plantarflexion 5/5  Skin: Skin is warm and dry.  Psychiatric: She has a normal mood and affect. Her behavior is normal  Assessment/Plan: 1. Debility secondary to perforated sigmoid colon/colectomy/colostomy which require 3+ hours per day of interdisciplinary therapy in  a comprehensive inpatient rehab setting. Physiatrist is providing close team supervision and 24 hour management of active medical problems listed below. Physiatrist and rehab team continue to assess barriers to discharge/monitor patient progress toward functional and medical goals.  Function:  Bathing Bathing position   Position: Wheelchair/chair at sink  Bathing parts Body parts bathed by patient: Right arm, Left arm, Chest, Abdomen, Front perineal area, Right upper leg, Left upper leg, Buttocks Body parts bathed by helper: Right lower leg, Left lower leg  Bathing assist Assist Level: Touching or steadying assistance(Pt > 75%)      Upper Body Dressing/Undressing Upper body dressing   What is the patient wearing?: Pull over shirt/dress     Pull over shirt/dress - Perfomed by patient: Thread/unthread right sleeve, Thread/unthread left sleeve, Put head through opening, Pull shirt over trunk Pull over shirt/dress - Perfomed by helper: Pull shirt over trunk        Upper body assist Assist Level: Set up   Set up : To obtain clothing/put away  Lower Body Dressing/Undressing Lower body dressing   What is the patient wearing?: Pants, Underwear Underwear - Performed by patient: Pull underwear up/down, Thread/unthread right underwear leg, Thread/unthread left underwear leg Underwear - Performed by helper: Thread/unthread right underwear leg, Thread/unthread left underwear leg Pants- Performed by patient: Pull pants up/down Pants- Performed by helper: Thread/unthread right pants leg, Thread/unthread left pants leg   Non-skid slipper socks- Performed by helper: Don/doff right sock, Don/doff left sock                  Lower body assist Assist for  lower body dressing: Touching or steadying assistance (Pt > 75%), Assistive device Assistive Device Comment: Engineer, maintenance (IT)   Toileting steps completed by patient: Adjust clothing prior to toileting, Performs perineal  hygiene, Adjust clothing after toileting Toileting steps completed by helper: Adjust clothing prior to toileting, Adjust clothing after toileting Toileting Assistive Devices: Grab bar or rail  Toileting assist Assist level: Touching or steadying assistance (Pt.75%)   Transfers Chair/bed transfer   Chair/bed transfer method: Stand pivot Chair/bed transfer assist level: Supervision or verbal cues Chair/bed transfer assistive device: Walker, Air cabin crew     Max distance: 175ft Assist level: Supervision or verbal cues   Wheelchair   Type: Manual Max wheelchair distance: 100 Assist Level: Supervision or verbal cues  Cognition Comprehension Comprehension assist level: Understands complex 90% of the time/cues 10% of the time  Expression Expression assist level: Expresses basic needs/ideas: With no assist  Social Interaction Social Interaction assist level: Interacts appropriately with others with medication or extra time (anti-anxiety, antidepressant).  Problem Solving Problem solving assist level: Solves complex 90% of the time/cues < 10% of the time  Memory Memory assist level: More than reasonable amount of time   Medical Problem List and Plan: 1. Debilitation secondary to perforated diverticulitis status post sigmoid colectomy, descending colostomy with small bowel resection placement of pelvic abscess drain [id number] complicated by GI bleed and ileus -continue CIR 2. DVT Prophylaxis/Anticoagulation: Coumadin resumed  INR subtherapeutic, appreciate pharmacy assistance, INR 1.42 today  Venous doppler ordered for RUE showing DVT on 5/24 - on coumadin, will be cautious due to recent GI bleed 3. Pain Management: Tylenol as needed 4. Acute blood loss anemia/GI bleed.   hgb 8.7 on 5/23 5. Neuropsych: This patient is capable of making decisions on her own behalf. 6. Skin/Wound Care: Routine skin checks/colostomy education and skin  care -wounds clean/intact, ostomy is competent  7. Fluids/Electrolytes/Nutrition: Routine I&O's  -Hypokalemia:3.6 on 5/26, repleting.  -Hypomagnesemia: IV mag ordered for 5/23 and again on 5/26, , 1.6 on 5/26 8. C. Dif: Per ID - Continued vancomycin and Zosyn through 06/14/2015 and then PO taper with vancomycin as directed then began vancomycin 125 mg twice a day 1 week then 125 mg daily 1 week then 125 mg every other day for 2 weeks and stop -contact precautions continue 9.HTN.Verapamil 120 mg daily. 10. Atrial fibrillation. Tachybradycardia syndrome with pacemaker. Cardiac rate control. Coumadin discontinued due to GI bleed. 11. ?Chronic renal insufficiency, although this appears less likely given normal ranges of creatinine and hospital.  -Cr. 0.85 on 5/26 12. Chronic diastolic congestive heart failure.Demadex 80 mg daily. Monitor for any signs of fluid overload. Weighing patient daily   -Appears to be stable around 67-68 KG's, however slightly labile recordings  -Will cont to monitor 13.C. difficile positive. Contact precautions, on vancomycin taper 14. Diabetes mellitus. Diet controlled. Hemoglobin A1c 6.2. Sliding scale insulin. Check blood sugars before meals and at bedtime, controlled CBG (last 3)   Recent Labs  06/21/15 0649 06/21/15 1643 06/22/15 0657  GLUCAP 82 120* 99     LOS (Days) 8 A FACE TO FACE EVALUATION WAS PERFORMED  Charlett Blake 06/22/2015 9:59 AM

## 2015-06-22 NOTE — Progress Notes (Signed)
ANTICOAGULATION CONSULT NOTE - Follow Up Consult  Pharmacy Consult for coumadin Indication: hx of storke and afib   Allergies  Allergen Reactions  . Anti-Inflammatory Enzyme [Nutritional Supplements] Other (See Comments)    Retains fluids and headaches  . Iodinated Diagnostic Agents Hives    HIVES 15MIN S/P IV CONTRAST INJECTION,WILL NEED 13 HR PREP FOR FUTURE INJECTIONS, ok s/p 50mg  po benadryl//a.calhoun  . Spironolactone Other (See Comments)    Hair loss  . Arthrotec [Diclofenac-Misoprostol] Other (See Comments)    unknown  . Biaxin [Clarithromycin] Other (See Comments)    unknown  . Plavix [Clopidogrel Bisulfate] Other (See Comments)    unknown    Patient Measurements: Weight: 148 lb 9.4 oz (67.4 kg) Heparin Dosing Weight:   Vital Signs: Temp: 98 F (36.7 C) (05/27 0454) Temp Source: Oral (05/27 0454) BP: 118/70 mmHg (05/27 0816) Pulse Rate: 107 (05/27 0454)  Labs:  Recent Labs  06/20/15 0517 06/21/15 0500 06/22/15 0508  LABPROT 16.1* 15.9* 17.5*  INR 1.27 1.25 1.42  CREATININE 0.88 0.85  --     Estimated Creatinine Clearance: 39.6 mL/min (by C-G formula based on Cr of 0.85).   Medications:  Scheduled:  . digoxin  0.0625 mg Oral Daily  . guaiFENesin  200 mg Oral BID  . insulin aspart  0-9 Units Subcutaneous TID WC  . magnesium gluconate  500 mg Oral Daily  . pantoprazole  40 mg Oral Daily  . potassium chloride  20 mEq Oral BID  . protein supplement  1 scoop Oral TID WC  . saccharomyces boulardii  250 mg Oral BID  . sodium chloride flush  10-40 mL Intracatheter Q12H  . torsemide  40 mg Oral Daily  . vancomycin  125 mg Oral Q12H   Followed by  . [START ON 06/24/2015] vancomycin  125 mg Oral Daily   Followed by  . [START ON 07/01/2015] vancomycin  125 mg Oral Q48H  . verapamil  120 mg Oral Daily  . Warfarin - Pharmacist Dosing Inpatient   Does not apply q1800  . zolpidem  2.5 mg Oral QHS   Infusions:    Assessment: 82 yo female with hx of stroke  and afib is currently on subtherapeutic coumadin.  INR today is 1.41.  Was previously on 2.5 mg daily except 5 mg on T/Th/Sun  Goal of Therapy:  INR 2-3 Monitor platelets by anticoagulation protocol: Yes   Plan:  Warfarin po 5 mg x 1 tonight Daily INR Monitor for S&S of bleed   Angela Burke, [name] Pharmacy Resident Pager: [phone] 06/22/2015,2:06 PM

## 2015-06-23 ENCOUNTER — Inpatient Hospital Stay (HOSPITAL_COMMUNITY): Payer: Medicare Other | Admitting: Physical Therapy

## 2015-06-23 LAB — PROTIME-INR
INR: 1.62 — ABNORMAL HIGH (ref 0.00–1.49)
Prothrombin Time: 19.2 seconds — ABNORMAL HIGH (ref 11.6–15.2)

## 2015-06-23 LAB — GLUCOSE, CAPILLARY
GLUCOSE-CAPILLARY: 98 mg/dL (ref 65–99)
GLUCOSE-CAPILLARY: 128 mg/dL — AB (ref 65–99)

## 2015-06-23 LAB — MAGNESIUM: MAGNESIUM: 2 mg/dL (ref 1.7–2.4)

## 2015-06-23 MED ORDER — WARFARIN SODIUM 5 MG PO TABS
5.0000 mg | ORAL_TABLET | Freq: Once | ORAL | Status: AC
  Administered 2015-06-23: 5 mg via ORAL
  Filled 2015-06-23: qty 1

## 2015-06-23 NOTE — Progress Notes (Signed)
ANTICOAGULATION CONSULT NOTE - Follow Up Consult  Pharmacy Consult for coumadin Indication: hx of storke and afib   Allergies  Allergen Reactions  . Anti-Inflammatory Enzyme [Nutritional Supplements] Other (See Comments)    Retains fluids and headaches  . Iodinated Diagnostic Agents Hives    HIVES 15MIN S/P IV CONTRAST INJECTION,WILL NEED 13 HR PREP FOR FUTURE INJECTIONS, ok s/p 50mg  po benadryl//a.calhoun  . Spironolactone Other (See Comments)    Hair loss  . Arthrotec [Diclofenac-Misoprostol] Other (See Comments)    unknown  . Biaxin [Clarithromycin] Other (See Comments)    unknown  . Plavix [Clopidogrel Bisulfate] Other (See Comments)    unknown    Patient Measurements: Weight: 148 lb 5.9 oz (67.3 kg) Heparin Dosing Weight:   Vital Signs: Temp: 98.4 F (36.9 C) (05/28 0441) Temp Source: Oral (05/28 0441) BP: 124/57 mmHg (05/28 0901) Pulse Rate: 92 (05/28 0854)  Labs:  Recent Labs  06/21/15 0500 06/22/15 0508 06/23/15 0512  LABPROT 15.9* 17.5* 19.2*  INR 1.25 1.42 1.62*  CREATININE 0.85  --   --     Estimated Creatinine Clearance: 39.6 mL/min (by C-G formula based on Cr of 0.85).   Medications:  Scheduled:  . digoxin  0.0625 mg Oral Daily  . guaiFENesin  200 mg Oral BID  . magnesium gluconate  500 mg Oral Daily  . pantoprazole  40 mg Oral Daily  . potassium chloride  20 mEq Oral BID  . protein supplement  1 scoop Oral TID WC  . saccharomyces boulardii  250 mg Oral BID  . sodium chloride flush  10-40 mL Intracatheter Q12H  . torsemide  40 mg Oral Daily  . vancomycin  125 mg Oral Q12H   Followed by  . [START ON 06/24/2015] vancomycin  125 mg Oral Daily   Followed by  . [START ON 07/01/2015] vancomycin  125 mg Oral Q48H  . verapamil  120 mg Oral Daily  . Warfarin - Pharmacist Dosing Inpatient   Does not apply q1800  . zolpidem  2.5 mg Oral QHS   Infusions:    [name].   INR today is 1.62.  Was previously on 2.5 mg daily except 5 mg on T/Th/Sun  Goal of Therapy:  INR 2-3 Monitor platelets by anticoagulation protocol: Yes   Plan:  Warfarin po 5 mg x 1 tonight Daily INR Monitor for S&S of bleed   Angela Burke, [name] Pharmacy Resident Pager: [phone] 06/23/2015,10:45 AM

## 2015-06-23 NOTE — Progress Notes (Signed)
Physical Therapy Session Note  Patient Details  Name: [name] MRN: [id number] Date of Birth: 12-Jul-1923  Today's Date: 06/23/2015 PT Individual Time: 1115-1210 PT Individual Time Calculation (min): 55 min   Short Term Goals: Week 1:  PT Short Term Goal 1 (Week 1): Patient will performed bed mobility with min A from PT PT Short Term Goal 2 (Week 1): Patient will perform WC<>bed transfer with min A and LRAD PT Short Term Goal 3 (Week 1): Patient will perform gait for 109ft with RW and min A from PT   Skilled Therapeutic Interventions/Progress Updates:   Patient in recliner upon arrival, reports not expecting to have therapy "on Sunday!" and requiring encouragement to participate due c/o fatigue. Patient ambulated to/from bathroom using RW and performed toileting tasks with supervision. Daughter [name] present to bring patient clean clothes. Patient agreeable to perform bathing and dressing seated EOB including emptying ostomy bag with daughter [name] assisting with all aspects of care as needed. Patient performed grooming tasks from wheelchair at sink. Patient propelled wheelchair using BUE for endurance x 50 ft + 25 ft with supervision. Stair training up/down 4 (6") stairs using BUE support on L rail only with supervision and cues for step-to sequencing, family reporting plan to have 2nd rail installed for home entry prior to DC home and on second trial patient negotiated up/down 4 (6") stairs using 2 rails with [name] providing supervision. Gait training using RW x 90 ft + 75 ft with supervision. Patient required frequent rest breaks throughout session due to fatigue, Sp02 on room air 92% or greater following activity. Patient left sitting in wheelchair to return to room with family.   Therapy Documentation Precautions:  Precautions Precautions: Fall Precaution Comments: IV lines and colostomy bag.   Restrictions Weight Bearing Restrictions: No Pain: Pain Assessment Pain Assessment:  No/denies pain   See Function Navigator for Current Functional Status.   Therapy/Group: Individual Therapy  Laretta Alstrom 06/23/2015, 12:28 PM

## 2015-06-23 NOTE — Progress Notes (Signed)
[place] PHYSICAL MEDICINE & REHABILITATION     PROGRESS NOTE    Subjective/Complaints: Slept better, has some buttocks pain but better with silvadene cream  ROS: Denies CP, SOB, N/V.  Objective: Vital Signs: Blood pressure 108/45, pulse 86, temperature 98.4 F (36.9 C), temperature source Oral, resp. rate 16, weight 67.3 kg (148 lb 5.9 oz), SpO2 93 %. No results found. No results for input(s): WBC, HGB, HCT, PLT in the last 72 hours.  Recent Labs  06/21/15 0500  NA 138  K 3.6  CL 102  GLUCOSE 98  BUN 12  CREATININE 0.85  CALCIUM 8.1*   CBG (last 3)   Recent Labs  06/22/15 0657 06/22/15 1600 06/23/15 0631  GLUCAP 99 109* 98    Wt Readings from Last 3 Encounters:  06/23/15 67.3 kg (148 lb 5.9 oz)  06/14/15 73.846 kg (162 lb 12.8 oz)  05/25/15 56.518 kg (124 lb 9.6 oz)    Physical Exam:  Constitutional: She appears well-developed and well-nourished.  HENT: dentition fair Head: Normocephalic and atraumatic.  Eyes: Conjunctivae and EOM are normal.  Neck: Normal range of motion. Neck supple. No thyromegaly present.  Cardiovascular: +Murmur. Irregularly irregular Respiratory: Effort normal and breath sounds normal. No respiratory distress.  GI: Soft. Bowel sounds are normal.  Colostomy in place. Abdominal incision c/d/i. Musculoskeletal: She exhibits edema (B/l LE, RUE). She exhibits no tenderness.  Neurological: She is alert. Sitting EOB with good sitting balance A&O2 HOH Motor: Bilateral upper extremities 5/5 proximal to distal Left lower extremity: Hip flexion, knee extension 4/5, ankle dorsi/plantar flexion were 5/5 Right lower extremity: Hip flexion knee extension 4/5, ankle dorsi/plantarflexion 5/5  Skin: Skin is warm and dry.  Psychiatric: She has a normal mood and affect. Her behavior is normal  Assessment/Plan: 1. Debility secondary to perforated sigmoid colon/colectomy/colostomy which require 3+ hours per day of interdisciplinary  therapy in a comprehensive inpatient rehab setting. Physiatrist is providing close team supervision and 24 hour management of active medical problems listed below. Physiatrist and rehab team continue to assess barriers to discharge/monitor patient progress toward functional and medical goals.  Function:  Bathing Bathing position   Position: Wheelchair/chair at sink  Bathing parts Body parts bathed by patient: Right arm, Left arm, Chest, Abdomen, Front perineal area, Right upper leg, Left upper leg, Buttocks Body parts bathed by helper: Right lower leg, Left lower leg  Bathing assist Assist Level: Touching or steadying assistance(Pt > 75%)      Upper Body Dressing/Undressing Upper body dressing   What is the patient wearing?: Pull over shirt/dress     Pull over shirt/dress - Perfomed by patient: Thread/unthread right sleeve, Thread/unthread left sleeve, Put head through opening, Pull shirt over trunk Pull over shirt/dress - Perfomed by helper: Pull shirt over trunk        Upper body assist Assist Level: Set up   Set up : To obtain clothing/put away  Lower Body Dressing/Undressing Lower body dressing   What is the patient wearing?: Pants, Underwear Underwear - Performed by patient: Pull underwear up/down, Thread/unthread right underwear leg, Thread/unthread left underwear leg Underwear - Performed by helper: Thread/unthread right underwear leg, Thread/unthread left underwear leg Pants- Performed by patient: Pull pants up/down Pants- Performed by helper: Thread/unthread right pants leg, Thread/unthread left pants leg   Non-skid slipper socks- Performed by helper: Don/doff right sock, Don/doff left sock                  Lower body assist Assist for lower  body dressing: Touching or steadying assistance (Pt > 75%), Assistive device Assistive Device Comment: Engineer, maintenance (IT)   Toileting steps completed by patient: Adjust clothing prior to toileting, Performs  perineal hygiene, Adjust clothing after toileting Toileting steps completed by helper: Adjust clothing prior to toileting, Adjust clothing after toileting Toileting Assistive Devices: Grab bar or rail  Toileting assist Assist level: Supervision or verbal cues   Transfers Chair/bed transfer   Chair/bed transfer method: Ambulatory Chair/bed transfer assist level: Supervision or verbal cues Chair/bed transfer assistive device: Walker, Air cabin crew     Max distance: 168ft Assist level: Supervision or verbal cues   Wheelchair   Type: Manual Max wheelchair distance: 100 Assist Level: Supervision or verbal cues  Cognition Comprehension Comprehension assist level: Understands complex 90% of the time/cues 10% of the time  Expression Expression assist level: Expresses basic needs/ideas: With no assist  Social Interaction Social Interaction assist level: Interacts appropriately with others with medication or extra time (anti-anxiety, antidepressant).  Problem Solving Problem solving assist level: Solves complex 90% of the time/cues < 10% of the time  Memory Memory assist level: More than reasonable amount of time   Medical Problem List and Plan: 1. Debilitation secondary to perforated diverticulitis status post sigmoid colectomy, descending colostomy with small bowel resection placement of pelvic abscess drain [id number] complicated by GI bleed and ileus -continue CIR PT, OT 2. DVT Prophylaxis/Anticoagulation: Coumadin resumed  INR subtherapeutic, appreciate pharmacy assistance, INR 1.62 today  Venous doppler ordered for RUE showing DVT on 5/24 - on coumadin, will be cautious due to recent GI bleed  3. Pain Management: Tylenol as needed 4. Acute blood loss anemia/GI bleed.   hgb 8.7 on 5/23, Repeat on 5/29 5. Neuropsych: This patient is capable of making decisions on her own behalf. 6. Skin/Wound Care: Routine skin checks/colostomy education and skin  care -wounds clean/intact, ostomy is competent  7. Fluids/Electrolytes/Nutrition: Routine I&O's  -Hypokalemia:3.6 on 5/26, repleting.  -Hypomagnesemia: IV mag ordered for 5/23 and again on 5/26, , 2.0 on 5/28 8. C. Dif: Per ID - Continued vancomycin and Zosyn through 06/14/2015 and then PO taper with vancomycin as directed then began vancomycin 125 mg twice a day 1 week then 125 mg daily 1 week then 125 mg every other day for 2 weeks and stop -contact precautions continue 9.HTN.Verapamil 120 mg daily. 10. Atrial fibrillation. Tachybradycardia syndrome with pacemaker. Cardiac rate control. Coumadin discontinued due to GI bleed. 11. ?Chronic renal insufficiency, although this appears less likely given normal ranges of creatinine and hospital.  -Cr. 0.85 on 5/26 12. Chronic diastolic congestive heart failure.Demadex 80 mg daily. Monitor for any signs of fluid overload. Weighing patient daily   -Appears to be stable around 67-68 KG's, however slightly labile recordings  -Will cont to monitor 13.C. difficile positive. Contact precautions, on vancomycin taper, no diarrhea 14. Diabetes mellitus. Diet controlled. Hemoglobin A1c 6.2. Sliding scale insulin. Check blood sugars before meals and at bedtime, controlled CBG (last 3)   Recent Labs  06/22/15 0657 06/22/15 1600 06/23/15 0631  GLUCAP 99 109* 98     LOS (Days) 9 A FACE TO FACE EVALUATION WAS PERFORMED  Charlett Blake 06/23/2015 8:49 AM

## 2015-06-24 ENCOUNTER — Inpatient Hospital Stay (HOSPITAL_COMMUNITY): Payer: Medicare Other | Admitting: Physical Therapy

## 2015-06-24 ENCOUNTER — Inpatient Hospital Stay (HOSPITAL_COMMUNITY): Payer: Medicare Other | Admitting: Occupational Therapy

## 2015-06-24 DIAGNOSIS — Z7901 Long term (current) use of anticoagulants: Secondary | ICD-10-CM

## 2015-06-24 DIAGNOSIS — Z5181 Encounter for therapeutic drug level monitoring: Secondary | ICD-10-CM | POA: Insufficient documentation

## 2015-06-24 LAB — CBC
HCT: 26.8 % — ABNORMAL LOW (ref 36.0–46.0)
Hemoglobin: 8.3 g/dL — ABNORMAL LOW (ref 12.0–15.0)
MCH: 26.7 pg (ref 26.0–34.0)
MCHC: 31 g/dL (ref 30.0–36.0)
MCV: 86.2 fL (ref 78.0–100.0)
PLATELETS: 295 10*3/uL (ref 150–400)
RBC: 3.11 MIL/uL — AB (ref 3.87–5.11)
RDW: 18 % — AB (ref 11.5–15.5)
WBC: 4.9 10*3/uL (ref 4.0–10.5)

## 2015-06-24 LAB — PROTIME-INR
INR: 1.74 — ABNORMAL HIGH (ref 0.00–1.49)
Prothrombin Time: 20.3 seconds — ABNORMAL HIGH (ref 11.6–15.2)

## 2015-06-24 LAB — OCCULT BLOOD X 1 CARD TO LAB, STOOL: FECAL OCCULT BLD: POSITIVE — AB

## 2015-06-24 LAB — GLUCOSE, CAPILLARY
GLUCOSE-CAPILLARY: 87 mg/dL (ref 65–99)
GLUCOSE-CAPILLARY: 116 mg/dL — AB (ref 65–99)

## 2015-06-24 LAB — MAGNESIUM: MAGNESIUM: 1.9 mg/dL (ref 1.7–2.4)

## 2015-06-24 MED ORDER — WARFARIN SODIUM 5 MG PO TABS
5.0000 mg | ORAL_TABLET | Freq: Once | ORAL | Status: AC
  Administered 2015-06-24: 5 mg via ORAL
  Filled 2015-06-24: qty 1

## 2015-06-24 NOTE — Progress Notes (Signed)
ANTICOAGULATION CONSULT NOTE - Follow Up Consult  Pharmacy Consult for coumadin Indication: hx of storke and afib   Allergies  Allergen Reactions  . Anti-Inflammatory Enzyme [Nutritional Supplements] Other (See Comments)    Retains fluids and headaches  . Iodinated Diagnostic Agents Hives    HIVES 15MIN S/P IV CONTRAST INJECTION,WILL NEED 13 HR PREP FOR FUTURE INJECTIONS, ok s/p 50mg  po benadryl//a.calhoun  . Spironolactone Other (See Comments)    Hair loss  . Arthrotec [Diclofenac-Misoprostol] Other (See Comments)    unknown  . Biaxin [Clarithromycin] Other (See Comments)    unknown  . Plavix [Clopidogrel Bisulfate] Other (See Comments)    unknown    Patient Measurements: Weight: 149 lb 11.1 oz (67.9 kg) Heparin Dosing Weight:   Vital Signs: Temp: 98.6 F (37 C) (05/29 0455) Temp Source: Oral (05/29 0455) BP: 107/41 mmHg (05/29 0455) Pulse Rate: 96 (05/29 0455)  Labs:  Recent Labs  06/22/15 0508 06/23/15 0512 06/24/15 0410  HGB  --   --  8.3*  HCT  --   --  26.8*  PLT  --   --  295  LABPROT 17.5* 19.2* 20.3*  INR 1.42 1.62* 1.74*    Estimated Creatinine Clearance: 39.6 mL/min (by C-G formula based on Cr of 0.85).   Medications:  Scheduled:  . digoxin  0.0625 mg Oral Daily  . guaiFENesin  200 mg Oral BID  . magnesium gluconate  500 mg Oral Daily  . pantoprazole  40 mg Oral Daily  . potassium chloride  20 mEq Oral BID  . protein supplement  1 scoop Oral TID WC  . saccharomyces boulardii  250 mg Oral BID  . sodium chloride flush  10-40 mL Intracatheter Q12H  . torsemide  40 mg Oral Daily  . vancomycin  125 mg Oral Daily   Followed by  . [START ON 07/01/2015] vancomycin  125 mg Oral Q48H  . verapamil  120 mg Oral Daily  . warfarin  5 mg Oral ONCE-1800  . Warfarin - Pharmacist Dosing Inpatient   Does not apply q1800  . zolpidem  2.5 mg Oral QHS   Infusions:    [name],  [name]  INR today is 1.74.  Was previously on 2.5 mg daily except 5 mg on T/Th/Sun. Pt w/ hx of GI bleed.  Goal of Therapy:  INR 2-3 Monitor platelets by anticoagulation protocol: Yes   Plan:  Warfarin po 5 mg x 1 tonight Daily INR Monitor for S&S of bleed  [name], [name] Clinical Pharmacy Resident Pager # [phone] 06/24/2015 10:56 AM

## 2015-06-24 NOTE — Progress Notes (Signed)
Physical Therapy Session Note  Patient Details  Name: [name] MRN: [id number] Date of Birth: 09-11-1923  Today's Date: 06/24/2015 PT Individual Time: [id number] [id number] and [id number] PT Individual Time Calculation (min): 73 min and 72 min  Short Term Goals: Week 1:  PT Short Term Goal 1 (Week 1): Patient will performed bed mobility with min A from PT PT Short Term Goal 2 (Week 1): Patient will perform WC<>bed transfer with min A and LRAD PT Short Term Goal 3 (Week 1): Patient will perform gait for 65ft with RW and min A from PT   Skilled Therapeutic Interventions/Progress Updates:    Treatment 1: Pt received in bed & agreeable to PT, denying c/o pain. Pt strongly wishes to bathe even when offered to shower with OT in following session. Pt independently completed sponge bathing while sitting on EOB & was able to don new outfit with assistance for threading underwear & pants on BLE. Pt reported urgent need to use restroom & completed stand pivot bed>BSC, (+) void & independently completed peri-hygiene. Pt completed 10 sit<>stands for endurance training, mini squats with BUE support and long arc quads x 10 reps each. Gait training x 75 + 50 + 65 ft with RW & supervision A with pt demonstrating decreased gait speed. PT provided verbal cuing for pt to reach back for w/c & control descent with stand>sit for rest breaks. Pt negotiated 4 steps with B rails, reporting she is having a 2nd rail installed today & required steady A fade to supervision for task, x 2 trials. Pt able to independently recall & demonstrate step-to pattern without assistance from PT. Gait training an additional 25 ft from hallway>room; at end of session pt left in recliner with BLE elevated & all needs within reach. Pt continues to require maximum cuing to square up to sitting surface & reach back before transferring stand>sit.   Treatment 2: Pt received in recliner, with daughter present, & agreeable to PT. Pt noted "discomfort" in  buttocks when sitting but otherwise denied c/o pain; during session educated pt to stand up frequently while daughter present to help alleviate pressure on buttocks. Gait training from recliner>bathroom with RW & supervision A. Pt able to complete toilet transfer from low toilet with grab bar & supervision A; (+) void. Pt transported room>gym via w/c total A for time management. Pt completed Berg Balance Test & scored [phone]; educated pt on interpretation of score & need for RW & assistance when ambulating in order to prevent falls. Patient demonstrates increased fall risk as noted by score of 34/56 on Berg Balance Scale.  (<36= high risk for falls, close to 100%; 37-45 significant >80%; 46-51 moderate >50%; 52-55 lower >25%). Utilized nu-step for BLE strengthening & endurance training on level 3 x 10 minutes, with pt reporting 13 on BORG RPE scale. Gait training 25 ft hallway>room with RW & Supervision, pt requesting to stand to apply protective cream to bottom & did so with supervision A. At end of session pt left in recliner with all needs within reach, heating pad on abdomen, & daughter present to supervise.    Therapy Documentation Precautions:  Precautions Precautions: Fall Precaution Comments: IV lines and colostomy bag.   Restrictions Weight Bearing Restrictions: No  Pain: Pain Assessment Pain Assessment: No/denies pain  Balance: Balance Balance Assessed: Yes Standardized Balance Assessment Standardized Balance Assessment: Berg Balance Test Berg Balance Test Sit to Stand: Able to stand  independently using hands Standing Unsupported: Able to stand 2 minutes with supervision (  with encouragement & distraction) Sitting with Back Unsupported but Feet Supported on Floor or Stool: Able to sit safely and securely 2 minutes Stand to Sit: Sits safely with minimal use of hands Transfers: Able to transfer safely, definite need of hands Standing Unsupported with Eyes Closed: Able to stand 10  seconds safely Standing Ubsupported with Feet Together: Able to place feet together independently and stand for 1 minute with supervision From Standing, Reach Forward with Outstretched Arm: Reaches forward but needs supervision From Standing Position, Pick up Object from Floor: Able to pick up shoe, needs supervision From Standing Position, Turn to Look Behind Over each Shoulder: Looks behind one side only/other side shows less weight shift Turn 360 Degrees: Needs close supervision or verbal cueing (13 seconds to each side) Standing Unsupported, Alternately Place Feet on Step/Stool: Needs assistance to keep from falling or unable to try Standing Unsupported, One Foot in Front: Able to take small step independently and hold 30 seconds Standing on One Leg: Unable to try or needs assist to prevent fall Total Score: 34   See Function Navigator for Current Functional Status.   Therapy/Group: Individual Therapy  Waunita Schooner 06/24/2015, 7:53 AM

## 2015-06-24 NOTE — Progress Notes (Signed)
Occupational Therapy Session Note  Patient Details  Name: [name] MRN: [id number] Date of Birth: July 05, 1923  Today's Date: 06/24/2015 OT Individual Time:  -   0915-1000  (45 min)      Short Term Goals: Week 1:  OT Short Term Goal 1 (Week 1): Pt will groom in standing at sink for  minutes with SBA OT Short Term Goal 2 (Week 1): Pt will bathe self with SBA OT Short Term Goal 3 (Week 1): Pt will dress self with Min assist OT Short Term Goal 4 (Week 1): Pt will transfer to toilet with min assist OT Short Term Goal 5 (Week 1): Pt will transfer to shower with min assist :    :     Skilled Therapeutic Interventions/Progress Updates:    Addressed strengthening, endurance, balance, and functional mobility.  Pt received in recliner& agreeable to  OT after treatment plan explained.  Ambulated around room to clean flower leafs, make bed and straighten items.  PPt ambulated with RW for 60 feet with one sit rest break for 5 minutes.  Pt stood for 3-4 minutes at sink to perform hair grooming and teeth brushing with supervision using sink for support.  .  She ambulated to recliner and repositioned self with items and heating pad  In place.  Left with all needs in reach.    Therapy Documentation Precautions:  Precautions Precautions: Fall Precaution Comments: IV lines and colostomy bag.   Restrictions Weight Bearing Restrictions: No      Pain:  3/10  abdomen            See Function Navigator for Current Functional Status.   Therapy/Group: Individual Therapy  Lisa Roca 06/24/2015, 7:53 AM

## 2015-06-24 NOTE — Progress Notes (Signed)
[place] PHYSICAL MEDICINE & REHABILITATION     PROGRESS NOTE    Subjective/Complaints: Patient sitting up in her chair eating breakfast this morning. She notes a decrease in edema in right upper extremity. She also states she had a good weekend.  ROS: Denies CP, SOB, N/V.  Objective: Vital Signs: Blood pressure 107/41, pulse 96, temperature 98.6 F (37 C), temperature source Oral, resp. rate 16, weight 67.9 kg (149 lb 11.1 oz), SpO2 93 %. No results found.  Recent Labs  06/24/15 0410  WBC 4.9  HGB 8.3*  HCT 26.8*  PLT 295   No results for input(s): NA, K, CL, GLUCOSE, BUN, CREATININE, CALCIUM in the last 72 hours.  Invalid input(s): CO CBG (last 3)   Recent Labs  06/23/15 0631 06/23/15 1630 06/24/15 0637  GLUCAP 98 128* 87    Wt Readings from Last 3 Encounters:  06/24/15 67.9 kg (149 lb 11.1 oz)  06/14/15 73.846 kg (162 lb 12.8 oz)  05/25/15 56.518 kg (124 lb 9.6 oz)    Physical Exam:  Constitutional: She appears well-developed and well-nourished.  HENT: dentition fair Head: Normocephalic and atraumatic.  Eyes: Conjunctivae and EOM are normal.  Neck: Normal range of motion. Neck supple. No thyromegaly present.  Cardiovascular: +Murmur. Irregularly irregular Respiratory: Effort normal and breath sounds normal. No respiratory distress.  GI: Soft. Bowel sounds are normal.  Colostomy in place. Abdominal incision c/d/i. Musculoskeletal: She exhibits edema (B/l LE, RUE- improving). She exhibits no tenderness.  Neurological: She is alert. Sitting EOB with good sitting balance A&O2 HOH Motor: Bilateral upper extremities 5/5 proximal to distal Left lower extremity: Hip flexion, knee extension 4-/5, ankle dorsi/plantar flexion were 5/5 Right lower extremity: Hip flexion knee extension 4/5, ankle dorsi/plantarflexion 5/5  Skin: Skin is warm and dry.  Psychiatric: She has a normal mood and affect. Her behavior is normal  Assessment/Plan: 1. Debility  secondary to perforated sigmoid colon/colectomy/colostomy which require 3+ hours per day of interdisciplinary therapy in a comprehensive inpatient rehab setting. Physiatrist is providing close team supervision and 24 hour management of active medical problems listed below. Physiatrist and rehab team continue to assess barriers to discharge/monitor patient progress toward functional and medical goals.  Function:  Bathing Bathing position   Position: Sitting EOB  Bathing parts Body parts bathed by patient: Right arm, Left arm, Chest, Abdomen, Front perineal area, Right upper leg, Left upper leg Body parts bathed by helper: Back  Bathing assist Assist Level: Set up   Set up : To obtain items, To open containers, To adjust water temperature  Upper Body Dressing/Undressing Upper body dressing   What is the patient wearing?: Pull over shirt/dress     Pull over shirt/dress - Perfomed by patient: Thread/unthread right sleeve, Thread/unthread left sleeve, Put head through opening, Pull shirt over trunk Pull over shirt/dress - Perfomed by helper: Pull shirt over trunk        Upper body assist Assist Level: Set up   Set up : To obtain clothing/put away  Lower Body Dressing/Undressing Lower body dressing   What is the patient wearing?: Pants, Underwear Underwear - Performed by patient: Pull underwear up/down, Thread/unthread right underwear leg, Thread/unthread left underwear leg Underwear - Performed by helper: Thread/unthread right underwear leg, Thread/unthread left underwear leg Pants- Performed by patient: Pull pants up/down Pants- Performed by helper: Thread/unthread right pants leg, Thread/unthread left pants leg   Non-skid slipper socks- Performed by helper: Don/doff right sock, Don/doff left sock  Lower body assist Assist for lower body dressing: Supervision or verbal cues, Set up Assistive Device Comment: reacher Set up : To obtain clothing/put away   Toileting Toileting   Toileting steps completed by patient: Performs perineal hygiene Toileting steps completed by helper: Adjust clothing prior to toileting, Adjust clothing after toileting Toileting Assistive Devices: Grab bar or rail  Toileting assist Assist level: Touching or steadying assistance (Pt.75%)   Transfers Chair/bed transfer   Chair/bed transfer method: Ambulatory Chair/bed transfer assist level: Supervision or verbal cues Chair/bed transfer assistive device: Walker, Air cabin crew     Max distance: 90 Assist level: Supervision or verbal cues   Wheelchair   Type: Manual Max wheelchair distance: 50 Assist Level: Supervision or verbal cues  Cognition Comprehension Comprehension assist level: Understands complex 90% of the time/cues 10% of the time  Expression Expression assist level: Expresses complex 90% of the time/cues < 10% of the time  Social Interaction Social Interaction assist level: Interacts appropriately 90% of the time - Needs monitoring or encouragement for participation or interaction.  Problem Solving Problem solving assist level: Solves complex 90% of the time/cues < 10% of the time  Memory Memory assist level: Recognizes or recalls 90% of the time/requires cueing < 10% of the time   Medical Problem List and Plan: 1. Debilitation secondary to perforated diverticulitis status post sigmoid colectomy, descending colostomy with small bowel resection placement of pelvic abscess drain 123XX123 complicated by GI bleed and ileus -continue CIR PT, OT 2. DVT Prophylaxis/Anticoagulation: Coumadin resumed  INR subtherapeutic, appreciate pharmacy assistance  Venous doppler ordered for RUE showing DVT on 5/24 - on coumadin, will be cautious due to recent GI bleed 3. Pain Management: Tylenol as needed 4. Acute blood loss anemia/GI bleed.   hgb 8.3 on 5/29  Will Hemoccult stools 5. Neuropsych: This patient is capable of  making decisions on her own behalf. 6. Skin/Wound Care: Routine skin checks/colostomy education and skin care -wounds clean/intact, ostomy is competent  7. Fluids/Electrolytes/Nutrition: Routine I&O's  -Hypokalemia: 3.6 on 5/26, repleting.  -Hypomagnesemia: IV mag ordered for 5/23 and again on 5/26, , 2.0 on 5/28 8. C. Dif: Per ID - Continued vancomycin and Zosyn through 06/14/2015 and then PO taper with vancomycin as directed then began vancomycin 125 mg twice a day 1 week then 125 mg daily 1 week then 125 mg every other day for 2 weeks and stop -contact precautions continue 9.HTN.Verapamil 120 mg daily. 10. Atrial fibrillation. Tachybradycardia syndrome with pacemaker. Cardiac rate control. Coumadin discontinued due to GI bleed. 11. ?Chronic renal insufficiency, although this appears less likely given normal ranges of creatinine and hospital.  -Cr. 0.85 on 5/26 12. Chronic diastolic congestive heart failure.Demadex 80 mg daily. Monitor for any signs of fluid overload. Weighing patient daily   -Appears to be stable around 67-68 KG's, however slightly labile recordings  -Will cont to monitor 13.C. difficile positive. Contact precautions 14. Diabetes mellitus. Diet controlled. Hemoglobin A1c 6.2. Sliding scale insulin. Check blood sugars before meals and at bedtime, controlled CBG (last 3)   Recent Labs  06/23/15 0631 06/23/15 1630 06/24/15 0637  GLUCAP 98 128* 87     LOS (Days) 10 A FACE TO FACE EVALUATION WAS PERFORMED  Pau Banh Lorie Phenix 06/24/2015 8:59 AM

## 2015-06-25 ENCOUNTER — Inpatient Hospital Stay (HOSPITAL_COMMUNITY): Payer: Medicare Other | Admitting: Physical Therapy

## 2015-06-25 ENCOUNTER — Inpatient Hospital Stay (HOSPITAL_COMMUNITY): Payer: Medicare Other

## 2015-06-25 DIAGNOSIS — K921 Melena: Secondary | ICD-10-CM | POA: Insufficient documentation

## 2015-06-25 LAB — BASIC METABOLIC PANEL
ANION GAP: 7 (ref 5–15)
BUN: 13 mg/dL (ref 6–20)
CALCIUM: 8.2 mg/dL — AB (ref 8.9–10.3)
CO2: 29 mmol/L (ref 22–32)
Chloride: 103 mmol/L (ref 101–111)
Creatinine, Ser: 0.86 mg/dL (ref 0.44–1.00)
GFR calc Af Amer: >60 mL/min (ref >60)
GFR, EST NON AFRICAN AMERICAN: 58 mL/min — AB (ref >60)
GLUCOSE: 127 mg/dL — AB (ref 65–99)
Potassium: 3.1 mmol/L — ABNORMAL LOW (ref 3.5–5.1)
SODIUM: 139 mmol/L (ref 135–145)

## 2015-06-25 LAB — CBC WITH DIFFERENTIAL/PLATELET
BASOS ABS: 0 10*3/uL (ref 0.0–0.1)
BASOS PCT: 1 %
EOS PCT: 4 %
Eosinophils Absolute: 0.2 10*3/uL (ref 0.0–0.7)
HCT: 27 % — ABNORMAL LOW (ref 36.0–46.0)
Hemoglobin: 8.2 g/dL — ABNORMAL LOW (ref 12.0–15.0)
Lymphocytes Relative: 29 %
Lymphs Abs: 1.3 10*3/uL (ref 0.7–4.0)
MCH: 26.2 pg (ref 26.0–34.0)
MCHC: 30.4 g/dL (ref 30.0–36.0)
MCV: 86.3 fL (ref 78.0–100.0)
MONO ABS: 0.5 10*3/uL (ref 0.1–1.0)
Monocytes Relative: 13 %
Neutro Abs: 2.3 10*3/uL (ref 1.7–7.7)
Neutrophils Relative %: 53 %
PLATELETS: 300 10*3/uL (ref 150–400)
RBC: 3.13 MIL/uL — ABNORMAL LOW (ref 3.87–5.11)
RDW: 17.8 % — AB (ref 11.5–15.5)
WBC: 4.3 10*3/uL (ref 4.0–10.5)

## 2015-06-25 LAB — PROTIME-INR
INR: 1.96 — AB (ref 0.00–1.49)
Prothrombin Time: 22.2 seconds — ABNORMAL HIGH (ref 11.6–15.2)

## 2015-06-25 LAB — OCCULT BLOOD X 1 CARD TO LAB, STOOL: FECAL OCCULT BLD: POSITIVE — AB

## 2015-06-25 LAB — GLUCOSE, CAPILLARY: GLUCOSE-CAPILLARY: 95 mg/dL (ref 65–99)

## 2015-06-25 LAB — MAGNESIUM: MAGNESIUM: 2 mg/dL (ref 1.7–2.4)

## 2015-06-25 MED ORDER — WARFARIN SODIUM 5 MG PO TABS
5.0000 mg | ORAL_TABLET | Freq: Once | ORAL | Status: AC
  Administered 2015-06-25: 5 mg via ORAL
  Filled 2015-06-25: qty 1

## 2015-06-25 MED ORDER — ACETAMINOPHEN 325 MG PO TABS
650.0000 mg | ORAL_TABLET | Freq: Four times a day (QID) | ORAL | Status: DC | PRN
  Administered 2015-06-25 – 2015-06-26 (×2): 650 mg via ORAL
  Filled 2015-06-25 (×2): qty 2

## 2015-06-25 MED ORDER — POTASSIUM CHLORIDE CRYS ER 20 MEQ PO TBCR
40.0000 meq | EXTENDED_RELEASE_TABLET | Freq: Two times a day (BID) | ORAL | Status: DC
  Administered 2015-06-25 – 2015-06-26 (×2): 40 meq via ORAL
  Filled 2015-06-25 (×2): qty 2

## 2015-06-25 NOTE — Discharge Summary (Signed)
Discharge summary job # [id number]

## 2015-06-25 NOTE — Progress Notes (Signed)
Physical Therapy Session Note  Patient Details  Name: [name] MRN: [id number] Date of Birth: Nov 25, 1923  Today's Date: 06/25/2015 PT Individual Time: 1630-1700 PT Individual Time Calculation (min): 30 min   Short Term Goals: Week 1:  PT Short Term Goal 1 (Week 1): Patient will performed bed mobility with min A from PT PT Short Term Goal 2 (Week 1): Patient will perform WC<>bed transfer with min A and LRAD PT Short Term Goal 3 (Week 1): Patient will perform gait for 92ft with RW and min A from PT   Skilled Therapeutic Interventions/Progress Updates:    Pt received in bed & agreeable to PT, denying c/o pain. Pt completed sit>stand transfer with mod I & ambulated in room to bathroom with RW & supervision. Pt able to complete toilet transfers to low toilet with use of grab bars & distant supervision; (+) void & pt independently performed peri-hygiene. Pt performed hand washing at sink with supervision but leaning on sink with forearms, and gait training x 10 ft back to room with distant supervision where pt requested rest break 2/2 fatigue. Session focused on standing balance tasks; pt performed standing with narrow base of support eyes open/closed, single leg stance with 1 UE support with steady A, single leg stance without BUE support with Mod A, tandem stance without BUE support with rest breaks as needed. At end of session pt left in recliner with all needs within reach & set up with meal tray.   Therapy Documentation Precautions:  Precautions Precautions: Fall Precaution Comments: decreased endurance Restrictions Weight Bearing Restrictions: No  Pain: Pain Assessment Pain Assessment: No/denies pain   See Function Navigator for Current Functional Status.   Therapy/Group: [place] 06/25/2015, 4:34 PM

## 2015-06-25 NOTE — Progress Notes (Signed)
Patient ID: [name], female   DOB: 1923/05/02, 82 y.o.   MRN: [id number]     [facility]      [address] [place], [place] [address]    Phone: [phone] FAX: [phone]     Subjective: In good spirits.  Tolerating po's, but admits appetite is only fair. Had an episode of BEBPR, has not noticed any per ostomy.  Objective:  Vital signs:  Filed Vitals:   06/24/15 0455 06/24/15 1600 06/25/15 0423 06/25/15 0834  BP: 107/41 146/84 154/77   Pulse: 96 82 99 101  Temp: 98.6 F (37 C) 97.6 F (36.4 C) 98 F (36.7 C)   TempSrc: Oral Oral Oral   Resp: _0 Weight: 67.9 kg (149 lb 11.1 oz)  67.5 kg (148 lb 13 oz)   SpO2: 93% 96% 98%     Last BM Date: 06/25/15  Intake/Output   Yesterday:  05/29 0701 - 05/30 0700 In: 240 [P.O.:240] Out: 175 [Stool:175] This shift:  Total I/O In: 120 [P.O.:120] Out: -    Physical Exam: General: Pt awake/alert/oriented x4 in no acute distress  Abdomen: Soft.  Nondistended. Incision is healing well, staples removed.  llq stoma is viable, stool present.  No evidence of peritonitis.  No incarcerated hernias.   Problem List:   Principal Problem:   Debilitated Active Problems:   Chronic atrial fibrillation (HCC)   CKD (chronic kidney disease) stage 3, GFR 30-59 ml/min   Diverticulitis of large intestine with perforation and abscess without bleeding   Chronic diastolic congestive heart failure (HCC)   Acute blood loss anemia   History of creation of ostomy   Hypokalemia   Hypomagnesemia   Localized edema   Acute deep vein thrombosis (DVT) of brachial vein of right upper extremity (HCC)   Subtherapeutic anticoagulation   Gastrointestinal hemorrhage with melena    Results:   Labs: Results for orders placed or performed during the hospital encounter of 06/14/15 (from the past 48 hour(s))  Glucose, capillary     Status: Abnormal   Collection Time: 06/23/15  4:30 PM   Result Value Ref Range   Glucose-Capillary 128 (H) 65 - 99 mg/dL  Protime-INR     Status: Abnormal   Collection Time: 06/24/15  4:10 AM  Result Value Ref Range   Prothrombin Time 20.3 (H) 11.6 - 15.2 seconds   INR 1.74 (H) 0.00 - 1.49  Magnesium     Status: None   Collection Time: 06/24/15  4:10 AM  Result Value Ref Range   Magnesium 1.9 1.7 - 2.4 mg/dL  CBC     Status: Abnormal   Collection Time: 06/24/15  4:10 AM  Result Value Ref Range   WBC 4.9 4.0 - 10.5 K/uL   RBC 3.11 (L) 3.87 - 5.11 MIL/uL   Hemoglobin 8.3 (L) 12.0 - 15.0 g/dL   HCT 26.8 (L) 36.0 - 46.0 %   MCV 86.2 78.0 - 100.0 fL   MCH 26.7 26.0 - 34.0 pg   MCHC 31.0 30.0 - 36.0 g/dL   RDW 18.0 (H) 11.5 - 15.5 %   Platelets 295 150 - 400 K/uL  Glucose, capillary     Status: None   Collection Time: 06/24/15  6:37 AM  Result Value Ref Range   Glucose-Capillary 87 65 - 99 mg/dL   Comment 1 Notify RN   Occult blood card to lab, stool RN will collect     Status: Abnormal  Collection Time: 06/24/15 12:00 PM  Result Value Ref Range   Fecal Occult Bld POSITIVE (A) NEGATIVE  Glucose, capillary     Status: Abnormal   Collection Time: 06/24/15  5:02 PM  Result Value Ref Range   Glucose-Capillary 116 (H) 65 - 99 mg/dL   Comment 1 Notify RN   Occult blood card to lab, stool RN will collect     Status: Abnormal   Collection Time: 06/25/15  5:02 AM  Result Value Ref Range   Fecal Occult Bld POSITIVE (A) NEGATIVE  Protime-INR     Status: Abnormal   Collection Time: 06/25/15  5:10 AM  Result Value Ref Range   Prothrombin Time 22.2 (H) 11.6 - 15.2 seconds   INR 1.96 (H) 0.00 - 1.49  Magnesium     Status: None   Collection Time: 06/25/15  5:10 AM  Result Value Ref Range   Magnesium 2.0 1.7 - 2.4 mg/dL  Basic metabolic panel     Status: Abnormal   Collection Time: 06/25/15  5:10 AM  Result Value Ref Range   Sodium 139 135 - 145 mmol/L   Potassium 3.1 (L) 3.5 - 5.1 mmol/L   Chloride 103 101 - 111 mmol/L   CO2 29 22 -  32 mmol/L   Glucose, Bld 127 (H) 65 - 99 mg/dL   BUN 13 6 - 20 mg/dL   Creatinine, Ser 0.86 0.44 - 1.00 mg/dL   Calcium 8.2 (L) 8.9 - 10.3 mg/dL   GFR calc non Af Amer 58 (L) >60 mL/min   GFR calc Af Amer >60 >60 mL/min    Comment: (NOTE) The eGFR has been calculated using the CKD EPI equation. This calculation has not been validated in all clinical situations. eGFR's persistently <60 mL/min signify possible Chronic Kidney Disease.    Anion gap 7 5 - 15  CBC with Differential/Platelet     Status: Abnormal   Collection Time: 06/25/15  5:10 AM  Result Value Ref Range   WBC 4.3 4.0 - 10.5 K/uL   RBC 3.13 (L) 3.87 - 5.11 MIL/uL   Hemoglobin 8.2 (L) 12.0 - 15.0 g/dL   HCT 27.0 (L) 36.0 - 46.0 %   MCV 86.3 78.0 - 100.0 fL   MCH 26.2 26.0 - 34.0 pg   MCHC 30.4 30.0 - 36.0 g/dL   RDW 17.8 (H) 11.5 - 15.5 %   Platelets 300 150 - 400 K/uL   Neutrophils Relative % 53 %   Neutro Abs 2.3 1.7 - 7.7 K/uL   Lymphocytes Relative 29 %   Lymphs Abs 1.3 0.7 - 4.0 K/uL   Monocytes Relative 13 %   Monocytes Absolute 0.5 0.1 - 1.0 K/uL   Eosinophils Relative 4 %   Eosinophils Absolute 0.2 0.0 - 0.7 K/uL   Basophils Relative 1 %   Basophils Absolute 0.0 0.0 - 0.1 K/uL  Glucose, capillary     Status: None   Collection Time: 06/25/15  6:44 AM  Result Value Ref Range   Glucose-Capillary 95 65 - 99 mg/dL    Imaging / Studies: No results found.  Medications / Allergies:  Scheduled Meds: . digoxin  0.0625 mg Oral Daily  . guaiFENesin  200 mg Oral BID  . magnesium gluconate  500 mg Oral Daily  . pantoprazole  40 mg Oral Daily  . potassium chloride  40 mEq Oral BID  . protein supplement  1 scoop Oral TID WC  . saccharomyces boulardii  250 mg Oral BID  .  sodium chloride flush  10-40 mL Intracatheter Q12H  . torsemide  40 mg Oral Daily  . vancomycin  125 mg Oral Daily   Followed by  . [START ON 07/01/2015] vancomycin  125 mg Oral Q48H  . verapamil  120 mg Oral Daily  . warfarin  5 mg Oral  ONCE-1800  . Warfarin - Pharmacist Dosing Inpatient   Does not apply q1800  . zolpidem  2.5 mg Oral QHS   Continuous Infusions:  PRN Meds:.ondansetron **OR** ondansetron (ZOFRAN) IV, silver sulfADIAZINE, sodium chloride flush, sorbitol  Antibiotics: Anti-infectives    Start     Dose/Rate Route Frequency Ordered Stop   07/01/15 0800  vancomycin (VANCOCIN) 50 mg/mL oral solution 125 mg     125 mg Oral Every 48 hours 06/17/15 1443 07/15/15 0759   06/24/15 0800  vancomycin (VANCOCIN) 50 mg/mL oral solution 125 mg     125 mg Oral Daily 06/17/15 1443 07/01/15 0759   06/17/15 2000  vancomycin (VANCOCIN) 50 mg/mL oral solution 125 mg     125 mg Oral Every 12 hours 06/17/15 1443 06/23/15 1950   06/15/15 0000  piperacillin-tazobactam (ZOSYN) IVPB 3.375 g  Status:  Discontinued     3.375 g 12.5 mL/hr over 240 Minutes Intravenous Every 8 hours 06/14/15 1700 06/17/15 1430   06/14/15 1800  vancomycin (VANCOCIN) 50 mg/mL oral solution 125 mg  Status:  Discontinued     125 mg Oral Every 6 hours 06/14/15 1700 06/17/15 1440        Assessment/Plan Perforated sigmoid diverticulitis with pelvic abscess and possible colovesical fistula Sigmoid colectomy, descending colostomy, small bowel resection, placement of pelvic abscess drain--05/31/15 Dr. Georgette Dover -staples removed.  May shower.  Ostomy care GI bleeding-h&h stable, but had a episode of rectal bleeding this am, ostomy is without frank blood.  We discussed risks and benefits of continuing coumadin.  She wishes to continues stating, "I'd rather die from bleeding than have another stroke."  Further management per primary team.  Please call for further assistance.   Erby Pian, [facility] For consults and floor pages call [phone](7A-4:30P)  06/25/2015 1:19 PM

## 2015-06-25 NOTE — Progress Notes (Signed)
[facility]     PROGRESS NOTE    Subjective/Complaints: Patient lying in bed this morning. She states she feels "fine" to questions asked.  ROS: Denies CP, SOB, N/V.  Objective: Vital Signs: Blood pressure 154/77, pulse 99, temperature 98 F (36.7 C), temperature source Oral, resp. rate 18, weight 67.5 kg (148 lb 13 oz), SpO2 98 %. No results found.  Recent Labs  06/24/15 0410 06/25/15 0510  WBC 4.9 4.3  HGB 8.3* 8.2*  HCT 26.8* 27.0*  PLT 295 300    Recent Labs  06/25/15 0510  NA 139  K 3.1*  CL 103  GLUCOSE 127*  BUN 13  CREATININE 0.86  CALCIUM 8.2*   CBG (last 3)   Recent Labs  06/24/15 0637 06/24/15 1702 06/25/15 0644  GLUCAP 87 116* 95    Wt Readings from Last 3 Encounters:  06/25/15 67.5 kg (148 lb 13 oz)  06/14/15 73.846 kg (162 lb 12.8 oz)  05/25/15 56.518 kg (124 lb 9.6 oz)    Physical Exam:  Constitutional: She appears well-developed and well-nourished.  HENT: Normocephalic and atraumatic.  Eyes: Conjunctivae and EOM are normal.  Neck: Normal range of motion. Neck supple. No thyromegaly present.  Cardiovascular: +Murmur. Irregularly irregular Respiratory: Effort normal and breath sounds normal. No respiratory distress.  GI: Soft. Nondistended.  Colostomy in place. Abdominal incision c/d/i. Musculoskeletal: She exhibits edema (B/l LE, RUE- improving). She exhibits no tenderness.  Neurological: She is alert. Sitting EOB with good sitting balance A&O2 HOH Motor: Bilateral upper extremities 5/5 proximal to distal Left lower extremity: Hip flexion, knee extension 4-/5, ankle dorsi/plantar flexion were 5/5 Right lower extremity: Hip flexion knee extension 4/5, ankle dorsi/plantarflexion 5/5  Skin: Skin is warm and dry.  Psychiatric: She has a normal mood and affect. Her behavior is normal  Assessment/Plan: 1. Debility secondary to perforated sigmoid colon/colectomy/colostomy which require 3+ hours  per day of interdisciplinary therapy in a comprehensive inpatient rehab setting. Physiatrist is providing close team supervision and 24 hour management of active medical problems listed below. Physiatrist and rehab team continue to assess barriers to discharge/monitor patient progress toward functional and medical goals.  Function:  Bathing Bathing position   Position: Sitting EOB  Bathing parts Body parts bathed by patient: Right arm, Left arm, Chest, Abdomen, Front perineal area, Right upper leg, Left upper leg Body parts bathed by helper: Back  Bathing assist Assist Level: Set up   Set up : To obtain items, To open containers, To adjust water temperature  Upper Body Dressing/Undressing Upper body dressing   What is the patient wearing?: Pull over shirt/dress     Pull over shirt/dress - Perfomed by patient: Thread/unthread right sleeve, Thread/unthread left sleeve, Put head through opening, Pull shirt over trunk Pull over shirt/dress - Perfomed by helper: Pull shirt over trunk        Upper body assist Assist Level: Set up   Set up : To obtain clothing/put away  Lower Body Dressing/Undressing Lower body dressing   What is the patient wearing?: Pants, Underwear Underwear - Performed by patient: Pull underwear up/down, Thread/unthread right underwear leg, Thread/unthread left underwear leg Underwear - Performed by helper: Thread/unthread right underwear leg, Thread/unthread left underwear leg Pants- Performed by patient: Pull pants up/down Pants- Performed by helper: Thread/unthread right pants leg, Thread/unthread left pants leg   Non-skid slipper socks- Performed by helper: Don/doff right sock, Don/doff left sock  Lower body assist Assist for lower body dressing: Supervision or verbal cues, Set up Assistive Device Comment: reacher Set up : To obtain clothing/put away  Toileting Toileting   Toileting steps completed by patient: Adjust clothing prior to  toileting, Performs perineal hygiene, Adjust clothing after toileting Toileting steps completed by helper: Adjust clothing prior to toileting, Adjust clothing after toileting Toileting Assistive Devices: Grab bar or rail  Toileting assist Assist level: Supervision or verbal cues   Transfers Chair/bed transfer   Chair/bed transfer method: Ambulatory Chair/bed transfer assist level: Supervision or verbal cues Chair/bed transfer assistive device: Walker, Air cabin crew     Max distance: 25 ft Assist level: Supervision or verbal cues   Wheelchair   Type: Manual Max wheelchair distance: 50 Assist Level: Supervision or verbal cues  Cognition Comprehension Comprehension assist level: Understands complex 90% of the time/cues 10% of the time  Expression Expression assist level: Expresses complex 90% of the time/cues < 10% of the time  Social Interaction Social Interaction assist level: Interacts appropriately 90% of the time - Needs monitoring or encouragement for participation or interaction.  Problem Solving Problem solving assist level: Solves complex 90% of the time/cues < 10% of the time  Memory Memory assist level: Recognizes or recalls 90% of the time/requires cueing < 10% of the time   Medical Problem List and Plan: 1. Debilitation secondary to perforated diverticulitis status post sigmoid colectomy, descending colostomy with small bowel resection placement of pelvic abscess drain [id number] complicated by GI bleed and ileus -continue CIR PT, OT 2. DVT Prophylaxis/Anticoagulation: Coumadin resumed  INR subtherapeutic, appreciate pharmacy assistance  Venous doppler ordered for RUE showing DVT on 5/24 - on coumadin, will be cautious due to recent GI bleed  Will follow-up with GI 3. Pain Management: Tylenol as needed 4. Acute blood loss anemia/GI bleed.   hgb 8.2 on 5/30  Hemoccult stools positive on 5/30  Will follow-up with GI 5. Neuropsych:  This patient is capable of making decisions on her own behalf. 6. Skin/Wound Care: Routine skin checks/colostomy education and skin care -wounds clean/intact, ostomy is competent  7. Fluids/Electrolytes/Nutrition: Routine I&O's  -Hypokalemia: 3.1 on 5/30, repleting, increased.  -Hypomagnesemia: IV mag ordered for 5/23 and again on 5/26,  2.0 on 5/29 8. C. Dif: Per ID - Continued vancomycin and Zosyn through 06/14/2015 and then PO taper with vancomycin as directed then began vancomycin 125 mg twice a day 1 week then 125 mg daily 1 week then 125 mg every other day for 2 weeks and stop -contact precautions continue 9.HTN.Verapamil 120 mg daily. 10. Atrial fibrillation. Tachybradycardia syndrome with pacemaker. Cardiac rate control. Coumadin discontinued due to GI bleed. 11. ?Chronic renal insufficiency, although this appears less likely given normal ranges of creatinine and hospital.  -Cr. 0.86 on 5/30 12. Chronic diastolic congestive heart failure.Demadex 80 mg daily. Monitor for any signs of fluid overload. Weighing patient daily   -Appears to be stable around 67-68 KG's, however slightly labile recordings  -Will cont to monitor 13.C. difficile positive. Contact precautions 14. Diabetes mellitus. Diet controlled. Hemoglobin A1c 6.2. Sliding scale insulin. Check blood sugars before meals and at bedtime, controlled CBG (last 3)   Recent Labs  06/24/15 0637 06/24/15 1702 06/25/15 0644  GLUCAP 87 116* 95     LOS (Days) 11 A FACE TO FACE EVALUATION WAS PERFORMED  [name] Lorie Phenix 06/25/2015 8:02 AM

## 2015-06-25 NOTE — Plan of Care (Signed)
Problem: RH Ambulation Goal: LTG Patient will ambulate in controlled environment (PT) LTG: Patient will ambulate in a controlled environment, # of feet with assistance (PT).  Outcome: Not Met (add Reason) Continues to demonstrate deficits with endurance which limit gait distance. Pt does amb with supervision.  Goal: LTG Patient will ambulate in community environment (PT) LTG: Patient will ambulate in community environment, # of feet with assistance (PT).  Outcome: Not Met (add Reason) Pt limited in gait distance by endurance.  Pt amb with supervision

## 2015-06-25 NOTE — Progress Notes (Signed)
ANTICOAGULATION CONSULT NOTE - Follow Up Consult  Pharmacy Consult for coumadin Indication: hx of afib and storke  Allergies  Allergen Reactions  . Anti-Inflammatory Enzyme [Nutritional Supplements] Other (See Comments)    Retains fluids and headaches  . Iodinated Diagnostic Agents Hives    HIVES 15MIN S/P IV CONTRAST INJECTION,WILL NEED 13 HR PREP FOR FUTURE INJECTIONS, ok s/p 50mg  po benadryl//a.calhoun  . Spironolactone Other (See Comments)    Hair loss  . Arthrotec [Diclofenac-Misoprostol] Other (See Comments)    unknown  . Biaxin [Clarithromycin] Other (See Comments)    unknown  . Plavix [Clopidogrel Bisulfate] Other (See Comments)    unknown    Patient Measurements: Weight: 148 lb 13 oz (67.5 kg) Heparin Dosing Weight:   Vital Signs: Temp: 98 F (36.7 C) (05/30 0423) Temp Source: Oral (05/30 0423) BP: 154/77 mmHg (05/30 0423) Pulse Rate: 99 (05/30 0423)  Labs:  Recent Labs  06/23/15 0512 06/24/15 0410 06/25/15 0510  HGB  --  8.3* 8.2*  HCT  --  26.8* 27.0*  PLT  --  295 300  LABPROT 19.2* 20.3* 22.2*  INR 1.62* 1.74* 1.96*  CREATININE  --   --  0.86    Estimated Creatinine Clearance: 39.1 mL/min (by C-G formula based on Cr of 0.86).   Medications:  Scheduled:  . digoxin  0.0625 mg Oral Daily  . guaiFENesin  200 mg Oral BID  . magnesium gluconate  500 mg Oral Daily  . pantoprazole  40 mg Oral Daily  . potassium chloride  40 mEq Oral BID  . protein supplement  1 scoop Oral TID WC  . saccharomyces boulardii  250 mg Oral BID  . sodium chloride flush  10-40 mL Intracatheter Q12H  . torsemide  40 mg Oral Daily  . vancomycin  125 mg Oral Daily   Followed by  . [START ON 07/01/2015] vancomycin  125 mg Oral Q48H  . verapamil  120 mg Oral Daily  . Warfarin - Pharmacist Dosing Inpatient   Does not apply q1800  . zolpidem  2.5 mg Oral QHS   Infusions:    Assessment: 82 yo female with hx of afib and stroke is currently on slightly subtherapeutic coumadin.   INR today is up to 1.96.  Goal of Therapy:  INR 2-3 Monitor platelets by anticoagulation protocol: Yes   Plan:  Coumadin 5 mg po x 1 Daily PT/INR.   Zakery Normington, [facility] 06/25/2015,8:22 AM

## 2015-06-25 NOTE — Progress Notes (Signed)
Occupational Therapy Session Note  Patient Details  Name: [name] MRN: [id number] Date of Birth: 10-11-1923  Today's Date: 06/25/2015 OT Individual Time: 0800-0900 OT Individual Time Calculation (min): 60 min    Short Term Goals: Week 1:  OT Short Term Goal 1 (Week 1): Pt will groom in standing at sink for  minutes with SBA OT Short Term Goal 2 (Week 1): Pt will bathe self with SBA OT Short Term Goal 3 (Week 1): Pt will dress self with Min assist OT Short Term Goal 4 (Week 1): Pt will transfer to toilet with min assist OT Short Term Goal 5 (Week 1): Pt will transfer to shower with min assist  Skilled Therapeutic Interventions/Progress Updates:    Pt engaged in BADL retraining including bathing and dressing with sit<>stand from w/c at sink.  Pt amb with RW to bathroom to use toilet prior to bathing and dressing.  Pt did not shower this morning secondary to medical/safety concerns. Pt continues to exhibit signs of bleeding when using toilet, dripping blood when standing.  RN notified and hospital brief donned so pt could amb back into room to complete bathing and dressing tasks.  Pt completed all tasks this morning at supervision level, requiring more than a reasonable amount of time to complete tasks.  Pt practiced tub bench transfers to simulate home environment, requiring min A for lifting legs.  Pt amb with RW to recliner and remained in recliner with all needs within reach.    Therapy Documentation Precautions:  Precautions Precautions: Fall Precaution Comments: IV lines and colostomy bag.   Restrictions Weight Bearing Restrictions: No Pain:    See Function Navigator for Current Functional Status.   Therapy/Group: Individual Therapy  Leroy Libman 06/25/2015, 9:04 AM

## 2015-06-25 NOTE — Discharge Summary (Signed)
[name], [name]             ACCOUNT NO.:  [id number]  MEDICAL RECORD NO.:  [id number]  LOCATION:  4W09C                        FACILITY:  [facility]  PHYSICIAN:  [name] Lesch, MD        DATE OF BIRTH:  1923-08-26  DATE OF ADMISSION:  06/06/2015 DATE OF DISCHARGE:  06/26/2015                              DISCHARGE SUMMARY   DISCHARGE DIAGNOSES: 1. Debilitation secondary to perforated diverticulitis status post     sigmoid colectomy, descending colostomy with small-bowel resection,     placement of pelvic abscess drain on May 31, 2015. 2. Chronic Coumadin for atrial fibrillation as well as DVT non-     occlusive right proximal brachial vein. 3. Pain management. 4. Acute blood loss anemia. 5. Clostridium difficile. 6. Hypertension. 7. Chronic diastolic congestive heart failure. 8. Diabetes mellitus, diet controlled.  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  Lives alone, independent prior to admission, using her furniture for balance.  She has a daughter, who stays with her as needed.  Presented on May 25, 2015, with left lower quadrant abdominal pain.  INR on admission of 3.01.  CT of the abdomen showed diverticulitis involving the sigmoid colon with previously noted extraluminal collection of gas worrisome for abscess with signs of perforation.  No change with conservative care and underwent sigmoid colectomy, descending colostomy, small-bowel resection with placement of pelvic abscess drain on May 31, 2015, per [name].  Hospital course, pain management.  Clostridium difficile, contact precautions.  TPN for nutritional support, diet slowly advanced.   Intravenous heparin initiated, ongoing Coumadin resumed on Jun 06, 2015.  Leukocytosis 28,300, placed on intravenous Zosyn and vancomycin for recurrent diverticulitis, duration until Jun 14, 2015, and then 2-week prolonged taper course by mouth vancomycin.  White blood cell count improved to 9.6.  Developed coffee-ground output from her ostomy Jun 06, 2015, with hemoglobin 8.5 to 9, latest hemoglobin 7.4, she was transfused.  KUB of the abdomen showed small-bowel dilatation with air-fluid levels consistent with small-bowel obstruction versus ileus.  Her diet was slowly downgraded and advanced as tolerated.  Admitted for comprehensive rehab program.  PAST MEDICAL HISTORY:  See discharge diagnoses.  SOCIAL HISTORY:  Lives alone with good supportive family.  Functional status upon admission to rehab services:  Min to mod assist, 68 feet with rolling walker; minimal assist, sit to stand; min to mod assist for activities of daily living.  PHYSICAL EXAMINATION:  VITAL SIGNS:  Blood pressure 107/71, pulse 110, temperature 97, respirations 18. GENERAL:  This was an alert female, in no acute distress, oriented x3. LUNGS:  Clear to auscultation without wheeze. CARDIAC:  Irregularly irregular. ABDOMEN:  Soft, nontender.  Good bowel sounds.  Colostomy in place.  REHABILITATION HOSPITAL COURSE:  The patient was admitted to inpatient rehab services with therapies initiated on a 3-hour daily basis, consisting of physical therapy, occupational therapy, and rehabilitation nursing.  The following issues were addressed during the patient's rehabilitation  stay.  Pertaining to [name]'s debilitation secondary to perforated diverticulitis, had undergone sigmoid colectomy, colostomy, small-bowel resection.  She would follow up with General Surgery.  Followup on Jun 25, 2015 by General Surgery.  Noted H and H to be stable.  She did have some episode of rectal bleeding, felt to be possibly related to  hemorrhoids, ostomy without frank blood.  All risks and benefits of continuing Coumadin were discussed with the patient, and she requested to continue with close monitoring of hemoglobin.  Latest INR 1.96, Coumadin had been resumed with no bridging with heparin. During her hospital course, noted upper extremity Dopplers on Jun 19, 2015, did show nonocclusive DVT noted in the right proximal brachial vein.  In light of this, her Coumadin remained ongoing.  Blood pressure and heart rate well controlled and monitored.  She was completing a course of vancomycin by mouth for C. diff, diverticulitis, remaining afebrile.  She exhibited no other signs of fluid overload.  Noted history of chronic diastolic congestive heart failure.  She would continue on Demadex as advised.  Blood sugars well controlled, diet controlled, hemoglobin A1c of 6.2.  The patient received weekly collaborative interdisciplinary team conferences to discuss estimated length of stay, family teaching, and any barriers to her discharge. Ambulating 75 feet with rolling walker at supervision.  Negotiated steps with bilateral hand rails, steady supervision.  Able to independently recall and demonstrate step 2 pattern without assistance from physical therapy.  She could gather her belongings for activities of daily living and homemaking.  Strength and endurance greatly improved throughout her rehab course, she was encouraged with overall progress.  DISCHARGE MEDICATIONS: 1. Lanoxin 0.0625 mg p.o. daily. 2. Guaifenesin 200 mg p.o. b.i.d. 3. Magnesium gluconate 500 mg p.o. daily. 4. Protonix 40 mg p.o. daily. 5. Florastor 250 mg p.o. b.i.d. 6. Demadex 40 mg p.o. daily. 7. Potassium chloride 40 mEq p.o. daily. 8. Vancomycin taper as directed 125 mg daily for 7 doses, then 125 mg     every other day for 7 doses and stop. 9. Verapamil 120 mg p.o. daily. 10.Coumadin latest dose of 5 mg adjusted accordingly for INR of 2.0-      3.0.  DIET:  Regular.  SPECIAL INSTRUCTIONS:  The patient would follow up [name], Rehab Services as needed; [name], General Surgery, call for appointment; [name], Medical Management; Dr. Sallyanne Kuster, Cardiology Service, call for appointment.  Home health nurse arranged to check INR on June 28, 2015, results to Dr. Sallyanne Kuster at [phone], fax [phone].  The patient should also have weekly CBCs, colostomy care as directed.     Lauraine Rinne, P.A.   ______________________________ [name] Lesch, MD    DA/MEDQ  D:  06/25/2015  T:  06/25/2015  Job:  [id number]  cc:   Sanda Klein, MD Lina Sayre Everlene Farrier, M.D. Imogene Burn. Tsuei, M.D.

## 2015-06-25 NOTE — Progress Notes (Signed)
Physical Therapy Discharge Summary  Patient Details  Name: [name] MRN: [id number] Date of Birth: [birth year]  Today's Date: 06/25/2015 PT Individual Time: 1105-1200 and 2426-8341 PT Individual Time Calculation (min): 55 min and 53 min    Patient has met 8 of 10 long term goals due to improved activity tolerance, improved balance and increased strength.  Patient to discharge at an ambulatory level Supervision.   Patient's care partner is independent to provide the necessary supervision assistance at discharge.  Reasons goals not met: Pt continues to be limited by poor activity tolerance and thus did not meet the distance set for her ambulation goals in controlled and community settings.  Pt's furthest ambulation distance to date is 74' which is adequate for her to d/c home where she can continue to progress her activity tolerance with family and HHPT.   Recommendation:  Patient will benefit from ongoing skilled PT services in home health setting to continue to advance safe functional mobility, address ongoing impairments in activity tolerance, strength, and balance, and minimize fall risk.  Equipment: No equipment provided  Reasons for discharge: treatment goals met  Patient/family agrees with progress made and goals achieved: Yes   Skilled Therapeutic Intervention: AM session:  Pt received resting in recliner and agreeable to therapy session. No c/o pain.  Session focus on activity tolerance, strengthening, stair negotiation, transfers, and ambulation.    Pt transfers sit<>stand throughout session with RW mod I and is able to perform stand pivot/squat pivot transfers to and from w/c mod I.    Pt requesting to toilet and performed clothing management and hygiene mod I.  Pt requires min assist to steady cup for emptying her ostomy bag.  Mod assist to rise from low toilet.    Pt propelled w/c x150' with 3 short rest breaks (less than 30 seconds each) to therapy gym for UE  strength and overall activity tolerance.    PT instructed pt in stair negotiation x8 steps with 2 rails with supervision and verbal cues for safety.  Pt reports her rail is already installed, but daughter reports it is "being worked on" and that pt will d/c to aunt's home for 2 weeks prior to returning to her own home.  Pt's d/c location has a ramp to enter.    PT instructed pt in amb x65' +125' with prolonged rest break in between, distant supervision with RW for ambulation.   Pt performs bed mobility mod I.    Pt returned to room in w/c at end of session and performed ambulatory transfers with RW and distant supervision.  Pt left upright in recliner with call bell in reach and needs met.   PM session:  Pt received resting in recliner, no c/o pain and agreeable to therapy session.  Pt performed car transfer with set up assist only.  PT instructed pt in amb up/down ramp with RW and supervision.  Pt reports that her sister's ramp is over 100' long and she will likely not have to amb or propel her w/c on the ramp.  PT instructed pt in amb on compliant surfaces x10' with BUE on L railing with supervision.  PT instructed pt in BLE therex x10 reps of LAQ with 3 second hold and hip flexion, and BUE therex x10 reps with 3# weight for bicep curls and chest presses.  Pt agreeable to ambulation back towards room, 125' with RW and distant supervision.  Pt returned to recliner at end of session and positioned with call bell  in reach and needs met.   PT Discharge Precautions/Restrictions Precautions Precaution Comments: decreased endurance Vital Signs Therapy Vitals Pulse Rate: (!) 101 Pain Pain Assessment Pain Assessment: No/denies pain Vision/Perception     Cognition Overall Cognitive Status: Within Functional Limits for tasks assessed Arousal/Alertness: Awake/alert Orientation Level: Oriented X4 Sensation Sensation Light Touch: Appears Intact Coordination Gross Motor Movements are Fluid and  Coordinated: Yes Fine Motor Movements are Fluid and Coordinated: Yes Motor  Motor Motor: Within Functional Limits Motor - Discharge Observations: ongoing deficits with endurance, but improved since time of eval  Mobility Bed Mobility Bed Mobility: Left Sidelying to Sit;Sit to Sidelying Left Rolling Right: 6: Modified independent (Device/Increase time) Rolling Left: 6: Modified independent (Device/Increase time) Left Sidelying to Sit: 6: Modified independent (Device/Increase time) Sit to Sidelying Left: 6: Modified independent (Device/Increase time) Transfers Transfers: Yes Sit to Stand: 6: Modified independent (Device/Increase time) Stand to Sit: 6: Modified independent (Device/Increase time) Stand Pivot Transfers: 6: Modified independent (Device/Increase time) Locomotion  Ambulation Ambulation: Yes Ambulation/Gait Assistance: 5: Supervision Ambulation Distance (Feet): 125 Feet Assistive device: Rolling walker Gait Gait: Yes Gait Pattern: Step-through pattern;Trunk flexed;Poor foot clearance - left;Poor foot clearance - right Gait velocity: slow Stairs / Additional Locomotion Stairs: Yes Stairs Assistance: 5: Supervision Stair Management Technique: Two rails Number of Stairs: 8 Wheelchair Mobility Wheelchair Mobility: Yes Wheelchair Assistance: 6: Modified independent (Device/Increase time) Environmental health practitioner: Both upper extremities Wheelchair Parts Management: Independent Distance: 150  Trunk/Postural Assessment  Cervical Assessment Cervical Assessment: Within Functional Limits Thoracic Assessment Thoracic Assessment: Within Functional Limits Lumbar Assessment Lumbar Assessment: Within Functional Limits Postural Control Postural Control: Within Functional Limits  Balance Balance Balance Assessed: Yes Dynamic Sitting Balance Dynamic Sitting - Level of Assistance: 6: Modified independent (Device/Increase time) Static Standing Balance Static Standing - Balance  Support: No upper extremity supported Static Standing - Level of Assistance: 5: Stand by assistance Dynamic Standing Balance Dynamic Standing - Balance Support: Right upper extremity supported;Left upper extremity supported;No upper extremity supported;During functional activity Dynamic Standing - Level of Assistance: 5: Stand by assistance Extremity Assessment      RLE Assessment RLE Assessment: Within Functional Limits RLE AROM (degrees) RLE Overall AROM Comments: WFL assessed in sitting RLE Strength Right Hip Flexion: 3+/5 Right Knee Flexion: 4+/5 Right Knee Extension: 4+/5 Right Ankle Dorsiflexion: 4+/5 Right Ankle Plantar Flexion: 4+/5 LLE Assessment LLE Assessment: Exceptions to WFL LLE AROM (degrees) LLE Overall AROM Comments: decreased hip flexion 2/2 weakness LLE Strength Left Hip Flexion: 3/5 Left Knee Flexion: 4-/5 Left Knee Extension: 4+/5 Left Ankle Dorsiflexion: 4+/5 Left Ankle Plantar Flexion: 4+/5   See Function Navigator for Current Functional Status.  Ludger Bones E Penven-Crew 06/25/2015, 12:27 PM

## 2015-06-26 ENCOUNTER — Telehealth: Payer: Self-pay | Admitting: Cardiology

## 2015-06-26 ENCOUNTER — Encounter: Payer: Medicare Other | Admitting: *Deleted

## 2015-06-26 LAB — BASIC METABOLIC PANEL
Anion gap: 7 (ref 5–15)
BUN: 14 mg/dL (ref 6–20)
CALCIUM: 8.4 mg/dL — AB (ref 8.9–10.3)
CO2: 31 mmol/L (ref 22–32)
CREATININE: 0.98 mg/dL (ref 0.44–1.00)
Chloride: 102 mmol/L (ref 101–111)
GFR, EST AFRICAN AMERICAN: 57 mL/min — AB (ref >60)
GFR, EST NON AFRICAN AMERICAN: 49 mL/min — AB (ref >60)
GLUCOSE: 109 mg/dL — AB (ref 65–99)
Potassium: 3.5 mmol/L (ref 3.5–5.1)
Sodium: 140 mmol/L (ref 135–145)

## 2015-06-26 LAB — CBC WITH DIFFERENTIAL/PLATELET
BASOS PCT: 0 %
Basophils Absolute: 0 10*3/uL (ref 0.0–0.1)
EOS ABS: 0.2 10*3/uL (ref 0.0–0.7)
EOS PCT: 5 %
HCT: 28.1 % — ABNORMAL LOW (ref 36.0–46.0)
Hemoglobin: 8.6 g/dL — ABNORMAL LOW (ref 12.0–15.0)
Lymphocytes Relative: 36 %
Lymphs Abs: 1.7 10*3/uL (ref 0.7–4.0)
MCH: 26.5 pg (ref 26.0–34.0)
MCHC: 30.6 g/dL (ref 30.0–36.0)
MCV: 86.5 fL (ref 78.0–100.0)
MONO ABS: 0.6 10*3/uL (ref 0.1–1.0)
MONOS PCT: 12 %
Neutro Abs: 2.2 10*3/uL (ref 1.7–7.7)
Neutrophils Relative %: 47 %
PLATELETS: 315 10*3/uL (ref 150–400)
RBC: 3.25 MIL/uL — ABNORMAL LOW (ref 3.87–5.11)
RDW: 17.4 % — AB (ref 11.5–15.5)
WBC: 4.8 10*3/uL (ref 4.0–10.5)

## 2015-06-26 LAB — GLUCOSE, CAPILLARY: GLUCOSE-CAPILLARY: 90 mg/dL (ref 65–99)

## 2015-06-26 LAB — PROTIME-INR
INR: 1.98 — AB (ref 0.00–1.49)
Prothrombin Time: 22.4 seconds — ABNORMAL HIGH (ref 11.6–15.2)

## 2015-06-26 LAB — OCCULT BLOOD X 1 CARD TO LAB, STOOL: FECAL OCCULT BLD: NEGATIVE

## 2015-06-26 LAB — MAGNESIUM: MAGNESIUM: 1.8 mg/dL (ref 1.7–2.4)

## 2015-06-26 MED ORDER — POTASSIUM CHLORIDE CRYS ER 20 MEQ PO TBCR: 40.0000 meq | EXTENDED_RELEASE_TABLET | Freq: Every day | ORAL | Status: DC

## 2015-06-26 MED ORDER — VANCOMYCIN 50 MG/ML ORAL SOLUTION: ORAL | Status: DC

## 2015-06-26 MED ORDER — MAGNESIUM GLUCONATE 500 MG PO TABS: 500.0000 mg | ORAL_TABLET | Freq: Every day | ORAL | Status: DC

## 2015-06-26 MED ORDER — VERAPAMIL HCL ER 180 MG PO TBCR: 180.0000 mg | EXTENDED_RELEASE_TABLET | Freq: Every day | ORAL | Status: DC

## 2015-06-26 MED ORDER — VERAPAMIL HCL ER 120 MG PO TBCR: 120.0000 mg | EXTENDED_RELEASE_TABLET | Freq: Every day | ORAL | Status: DC

## 2015-06-26 MED ORDER — WARFARIN SODIUM 5 MG PO TABS: 5.0000 mg | ORAL_TABLET | Freq: Once | ORAL | Status: DC

## 2015-06-26 MED ORDER — SACCHAROMYCES BOULARDII 250 MG PO CAPS: 250.0000 mg | ORAL_CAPSULE | Freq: Two times a day (BID) | ORAL | Status: DC

## 2015-06-26 MED ORDER — TORSEMIDE 20 MG PO TABS: 40.0000 mg | ORAL_TABLET | Freq: Every day | ORAL | Status: DC

## 2015-06-26 MED ORDER — PANTOPRAZOLE SODIUM 40 MG PO TBEC: 40.0000 mg | DELAYED_RELEASE_TABLET | Freq: Every day | ORAL | Status: DC

## 2015-06-26 MED ORDER — DIGOXIN 62.5 MCG PO TABS: 0.0625 mg | ORAL_TABLET | Freq: Every day | ORAL | Status: DC

## 2015-06-26 MED ORDER — WARFARIN SODIUM 5 MG PO TABS: 5.0000 mg | ORAL_TABLET | Freq: Every day | ORAL | Status: DC

## 2015-06-26 MED FILL — DIGITEK 125 MCG TABLET: 125 | 30 days supply | Qty: 15 | Fill #0

## 2015-06-26 MED FILL — WARFARIN SODIUM 5 MG TABLET: 5 | 30 days supply | Qty: 30 | Fill #0

## 2015-06-26 MED FILL — PANTOPRAZOLE SOD DR 40 MG T: 40 | 30 days supply | Qty: 30 | Fill #0

## 2015-06-26 MED FILL — KLOR-CON M20 TABLET: 20 | 15 days supply | Qty: 30 | Fill #0

## 2015-06-26 MED FILL — VANCOMYCIN HCL 125 MG CAP: 125 | 19 days supply | Qty: 12 | Fill #0

## 2015-06-26 MED FILL — TORSEMIDE 20 MG TABLET: 20 | 15 days supply | Qty: 30 | Fill #0

## 2015-06-26 MED FILL — VERAPAMIL ER 120 MG TABLET: 120 | 30 days supply | Qty: 30 | Fill #0

## 2015-06-26 NOTE — Progress Notes (Signed)
ANTICOAGULATION CONSULT NOTE - Follow Up Consult  Pharmacy Consult for coumadin Indication: hx of afib and stroke  Allergies  Allergen Reactions  . Anti-Inflammatory Enzyme [Nutritional Supplements] Other (See Comments)    Retains fluids and headaches  . Iodinated Diagnostic Agents Hives    HIVES 15MIN S/P IV CONTRAST INJECTION,WILL NEED 13 HR PREP FOR FUTURE INJECTIONS, ok s/p 50mg  po benadryl//a.calhoun  . Spironolactone Other (See Comments)    Hair loss  . Arthrotec [Diclofenac-Misoprostol] Other (See Comments)    unknown  . Biaxin [Clarithromycin] Other (See Comments)    unknown  . Plavix [Clopidogrel Bisulfate] Other (See Comments)    unknown    Patient Measurements: Weight: 147 lb 0.8 oz (66.7 kg) Heparin Dosing Weight:   Vital Signs: Temp: 98.2 F (36.8 C) (05/31 0425) Temp Source: Oral (05/31 0425) BP: 104/51 mmHg (05/31 0425) Pulse Rate: 89 (05/31 0425)  Labs:  Recent Labs  06/24/15 0410 06/25/15 0510 06/26/15 0445  HGB 8.3* 8.2* 8.6*  HCT 26.8* 27.0* 28.1*  PLT 295 300 315  LABPROT 20.3* 22.2* 22.4*  INR 1.74* 1.96* 1.98*  CREATININE  --  0.86 0.98    Estimated Creatinine Clearance: 34.3 mL/min (by C-G formula based on Cr of 0.98).   Medications:  Scheduled:  . digoxin  0.0625 mg Oral Daily  . guaiFENesin  200 mg Oral BID  . magnesium gluconate  500 mg Oral Daily  . pantoprazole  40 mg Oral Daily  . potassium chloride  40 mEq Oral BID  . protein supplement  1 scoop Oral TID WC  . saccharomyces boulardii  250 mg Oral BID  . sodium chloride flush  10-40 mL Intracatheter Q12H  . torsemide  40 mg Oral Daily  . vancomycin  125 mg Oral Daily   Followed by  . [START ON 07/01/2015] vancomycin  125 mg Oral Q48H  . verapamil  120 mg Oral Daily  . Warfarin - Pharmacist Dosing Inpatient   Does not apply q1800  . zolpidem  2.5 mg Oral QHS   Infusions:    Assessment: 82 yo female with hx of afib and stroke is currently on slightly subtherapeutic  coumadin.  INR today is 1.98.  Goal of Therapy:  INR 2-3 Monitor platelets by anticoagulation protocol: Yes   Plan:  - coumadin 5 mg po x1 - can continue coumadin 5 mg po daily for now and rec to recheck INR on Friday at outpatient to reassess dosing  Shelanda Duvall, Tsz-Yin 06/26/2015,8:16 AM

## 2015-06-26 NOTE — Progress Notes (Signed)
Patient and family received discharge instructions from Dan Angiulli, PA-C with verbal understanding. Patient discharged to home with family and belongings. 

## 2015-06-26 NOTE — Progress Notes (Addendum)
Social Work  Discharge Note  The overall goal for the admission was met for:   Discharge location: Yes - Home with family, however, now to d/c to niece's home @ [facility]. [place].  Contact person: daughterMarland Kitchen Melinda Crutch @ [phone]  Length of Stay: Yes - 12 days  Discharge activity level: Yes - supervision/ min assist  Home/community participation: Yes  Services provided included: MD, RD, PT, OT, RN, TR, Pharmacy and SW  Financial Services: Medicare and Private Insurance: [employer]  Follow-up services arranged: Home Health: RN, PT via [facility], DME: hospital bed via [facility] and Patient/Family has no preference for HH/DME agencies  Comments (or additional information):  Patient/Family verbalized understanding of follow-up arrangements: Yes  Individual responsible for coordination of the follow-up plan: pt  Confirmed correct DME delivered: Linus Weckerly 06/26/2015    Ladainian Therien

## 2015-06-26 NOTE — Progress Notes (Signed)
[facility]ILIT[facility]     PROGRESS NOTE    Subjective/Complaints: Patient lying in bed this morning. She is easily arousable. She states that she is still here, when asked how she is doing. She is looking very forward to going home today.  ROS: Denies CP, SOB, N/V.  Objective: Vital Signs: Blood pressure 104/51, pulse 89, temperature 98.2 F (36.8 C), temperature source Oral, resp. rate 16, weight 66.7 kg (147 lb 0.8 oz), SpO2 93 %. No results found.  Recent Labs  06/25/15 0510 06/26/15 0445  WBC 4.3 4.8  HGB 8.2* 8.6*  HCT 27.0* 28.1*  PLT 300 315    Recent Labs  06/25/15 0510 06/26/15 0445  NA 139 140  K 3.1* 3.5  CL 103 102  GLUCOSE 127* 109*  BUN 13 14  CREATININE 0.86 0.98  CALCIUM 8.2* 8.4*   CBG (last 3)   Recent Labs  06/24/15 1702 06/25/15 0644 06/26/15 0642  GLUCAP 116* 95 90    Wt Readings from Last 3 Encounters:  06/26/15 66.7 kg (147 lb 0.8 oz)  06/14/15 73.846 kg (162 lb 12.8 oz)  05/25/15 56.518 kg (124 lb 9.6 oz)    Physical Exam:  Constitutional: She appears well-developed and well-nourished.  HENT: Normocephalic and atraumatic.  Eyes: Conjunctivae and EOM are normal.  Neck: Normal range of motion. Neck supple. No thyromegaly present.  Cardiovascular: +Murmur. Irregularly irregular Respiratory: Effort normal and breath sounds normal. No respiratory distress.  GI: Soft. Nondistended. Colostomy in place. Abdominal incision c/d/i. Musculoskeletal: She exhibits edema (B/l LE, RUE- improving). She exhibits no tenderness.  Neurological: She is alert.  A&O2 HOH Motor: Bilateral upper extremities 5/5 proximal to distal Left lower extremity: Hip flexion, knee extension 4+/5, ankle dorsi/plantar flexion were 5/5 Right lower extremity: Hip flexion knee extension 4+/5, ankle dorsi/plantarflexion 5/5  Skin: Skin is warm and dry. Staples removed  Psychiatric: She has a normal mood and affect. Her behavior is  normal  Assessment/Plan: 1. Debility secondary to perforated sigmoid colon/colectomy/colostomy which require 3+ hours per day of interdisciplinary therapy in a comprehensive inpatient rehab setting. Physiatrist is providing close team supervision and 24 hour management of active medical problems listed below. Physiatrist and rehab team continue to assess barriers to discharge/monitor patient progress toward functional and medical goals.  Function:  Bathing Bathing position   Position: Wheelchair/chair at sink  Bathing parts Body parts bathed by patient: Right arm, Left arm, Chest, Abdomen, Front perineal area, Right upper leg, Left upper leg, Buttocks, Right lower leg, Left lower leg Body parts bathed by helper: Back  Bathing assist Assist Level: Set up, Supervision or verbal cues   Set up : To obtain items  Upper Body Dressing/Undressing Upper body dressing   What is the patient wearing?: Pull over shirt/dress     Pull over shirt/dress - Perfomed by patient: Thread/unthread right sleeve, Thread/unthread left sleeve, Put head through opening, Pull shirt over trunk Pull over shirt/dress - Perfomed by helper: Pull shirt over trunk        Upper body assist Assist Level: Set up   Set up : To obtain clothing/put away  Lower Body Dressing/Undressing Lower body dressing   What is the patient wearing?: Pants, Underwear, Non-skid slipper socks Underwear - Performed by patient: Pull underwear up/down, Thread/unthread right underwear leg, Thread/unthread left underwear leg Underwear - Performed by helper: Thread/unthread right underwear leg, Thread/unthread left underwear leg Pants- Performed by patient: Thread/unthread right pants leg, Thread/unthread left pants leg, Pull pants  up/down Pants- Performed by helper: Thread/unthread right pants leg, Thread/unthread left pants leg Non-skid slipper socks- Performed by patient: Don/doff right sock, Don/doff left sock Non-skid slipper socks-  Performed by helper: Don/doff right sock, Don/doff left sock                  Lower body assist Assist for lower body dressing: Supervision or verbal cues, Set up Assistive Device Comment: reacher Set up : To obtain clothing/put away  Toileting Toileting   Toileting steps completed by patient: Adjust clothing prior to toileting, Performs perineal hygiene, Adjust clothing after toileting Toileting steps completed by helper: Adjust clothing prior to toileting, Adjust clothing after toileting Toileting Assistive Devices: Grab bar or rail  Toileting assist Assist level: Supervision or verbal cues   Transfers Chair/bed transfer   Chair/bed transfer method: Ambulatory, Stand pivot (mod I stand/pivot, supervision ambulatory) Chair/bed transfer assist level: Supervision or verbal cues Chair/bed transfer assistive device: Walker, Air cabin crew     Max distance: 15 ft Assist level: Supervision or verbal cues   Wheelchair   Type: Manual Max wheelchair distance: 150 Assist Level: No help, No cues, assistive device, takes more than reasonable amount of time  Cognition Comprehension Comprehension assist level: Understands complex 90% of the time/cues 10% of the time  Expression Expression assist level: Expresses complex 90% of the time/cues < 10% of the time  Social Interaction Social Interaction assist level: Interacts appropriately 90% of the time - Needs monitoring or encouragement for participation or interaction.  Problem Solving Problem solving assist level: Solves complex 90% of the time/cues < 10% of the time  Memory Memory assist level: Recognizes or recalls 90% of the time/requires cueing < 10% of the time   Medical Problem List and Plan: 1. Debilitation secondary to perforated diverticulitis status post sigmoid colectomy, descending colostomy with small bowel resection placement of pelvic abscess drain [id number] complicated by GI bleed and  ileus -DC today 2. DVT Prophylaxis/Anticoagulation: Coumadin resumed  INR subtherapeutic, appreciate pharmacy assistance  Venous doppler ordered for RUE showing DVT on 5/24 - on coumadin, will be cautious due to recent GI bleed  Will follow-up with GI 3. Pain Management: Tylenol as needed 4. Acute blood loss anemia/GI bleed.   hgb 8.6 on 5/31 (stable)  Hemoccult stools positive on 5/30, negative on 5/31  Appreciate surgery consult, had discussion with patient who decided patient would undergo risks of bleeding versus stroke. 5. Neuropsych: This patient is capable of making decisions on her own behalf. 6. Skin/Wound Care: Routine skin checks/colostomy education and skin care -wounds clean/intact, ostomy is competent  7. Fluids/Electrolytes/Nutrition: Routine I&O's  -Hypokalemia: 3.5 on 5/31, repleting, will need close follow-up as outpatient.  -Hypomagnesemia: IV mag ordered for 5/23 and again on 5/26,  1.8 on 5/31 8. C. Dif: Per ID - Continued vancomycin and Zosyn through 06/14/2015 and then PO taper with vancomycin as directed then began vancomycin 125 mg twice a day 1 week then 125 mg daily 1 week then 125 mg every other day for 2 weeks and stop -contact precautions continue 9.HTN.Verapamil 120 mg daily. 10. Atrial fibrillation. Tachybradycardia syndrome with pacemaker. Cardiac rate control. Coumadin discontinued due to GI bleed. 11. ?Chronic renal insufficiency, although this appears less likely given normal ranges of creatinine and hospital.  -Cr. 0.98 on 5/31 12. Chronic diastolic congestive heart failure.Demadex 80 mg daily. Monitor for any signs of fluid overload. Weighing patient daily   -Appears to be stable around [place],  however slightly labile recordings 13.C. difficile positive. Contact precautions 14. Diabetes mellitus. Diet controlled. Hemoglobin A1c 6.2. Sliding scale insulin. Check blood sugars before meals and at bedtime,  controlled CBG (last 3)   Recent Labs  06/24/15 1702 06/25/15 0644 06/26/15 0642  GLUCAP 116* 95 90     LOS (Days) 12 A FACE TO FACE EVALUATION WAS PERFORMED  Dorthey Depace Lorie Phenix 06/26/2015 7:41 AM

## 2015-06-26 NOTE — Progress Notes (Signed)
Occupational Therapy Discharge Summary  Patient Details  Name: [name] MRN: [id number] Date of Birth: Oct 10, 1923  Patient has met 9 of 10 long term goals due to improved activity tolerance, improved balance and ability to compensate for deficits.  Pt made steady progress with BADLs during this admission.  Pt continues to experinece decreased energy level necessitating frequent rest breaks during completion of BADLs. Pt completes all BADLs at the supervision level. Patient to discharge at overall Supervision level.  Patient's care partner is independent to provide the necessary physical assistance at discharge.    Reasons goals not met: Pt did not meet goal of simple meal prep due to low activity tolerance. 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  Recommendation:  Patient will benefit from ongoing skilled OT services in home health setting to continue to advance functional skills in the area of BADL.  Equipment: No equipment provided  Reasons for discharge: treatment goals met  Patient/family agrees with progress made and goals achieved: Yes  OT Discharge  Vision/Perception  Vision- History Baseline Vision/History: Wears glasses Wears Glasses: Reading only Vision- Assessment Vision Assessment?: No apparent visual deficits  Cognition Overall Cognitive Status: Within Functional Limits for tasks assessed Arousal/Alertness: Awake/alert Orientation Level: Oriented X4 Attention: Selective Selective Attention: Appears intact Memory: Appears intact Problem Solving: Appears intact Problem Solving Impairment: Functional basic;Verbal basic Safety/Judgment: Appears intact Sensation Sensation Light Touch: Appears Intact Hot/Cold: Appears Intact Proprioception: Appears Intact Coordination Gross Motor Movements are Fluid and Coordinated: Yes Fine Motor Movements are Fluid and Coordinated: Yes Motor  Motor Motor: Within Functional Limits Motor - Discharge Observations: ongoing  deficits with endurance, but improved since time of eval Trunk/Postural Assessment  Cervical Assessment Cervical Assessment: Within Functional Limits Thoracic Assessment Thoracic Assessment: Within Functional Limits Lumbar Assessment Lumbar Assessment: Within Functional Limits Postural Control Postural Control: Within Functional Limits  Balance Static Sitting Balance Static Sitting - Level of Assistance: 7: Independent Dynamic Sitting Balance Dynamic Sitting - Level of Assistance: 6: Modified independent (Device/Increase time) Extremity/Trunk Assessment RUE Assessment RUE Assessment: Within Functional Limits LUE Assessment LUE Assessment: Within Functional Limits   See Function Navigator for Current Functional Status.  [name] [facility] 06/26/2015, 6:40 AM

## 2015-06-26 NOTE — Consult Note (Signed)
WOC ostomy follow up Stoma type/location: LLQ, end colostomy Stomal assessment/size: 1" x 1 1/8" oval shaped, located in a crease at 3:00 o'clock and 9:00 o'clock, 50% red, 50% slough, stoma retracted below skin level. Peristomal assessment: intact skin surrounding Treatment options for stomal/peristomal skin: using 2" barrier ring to aid in seal Output: Large amt semiformed stool in pouch Ostomy pouching: 1pc flat pouch with 2" barrier ring to maintain seal Education provided: Reviewed pouching routines and ordering supplies. Pt assisted with pouch change and was able to apply barrier ring to back of pouch. Pt is able to open and close velcro to empty. She denies further questions at this time. Enrolled patient in [facility] Start Discharge program: Yes Extra supplies at the bedside for discharge today. Please re-consult if further assistance is needed. Thank-you,  Julien Girt MSN, [place], [place], [place], [place]

## 2015-06-26 NOTE — Telephone Encounter (Signed)
Attempted to confirm remote transmission with pt. No answer and was unable to leave a message.   

## 2015-06-26 NOTE — Plan of Care (Signed)
Problem: RH Simple Meal Prep Goal: LTG Patient will perform simple meal prep w/assist (OT) LTG: Patient will perform simple meal prep with assistance, with/without cues (OT).  Outcome: Not Met (add Reason) Goal not met secondary to ongoing fatigue and increased pain

## 2015-06-26 NOTE — Discharge Instructions (Signed)
Inpatient Rehab Discharge Instructions  GINAMARIE ALLBEE Discharge date and time: No discharge date for patient encounter.   Activities/Precautions/ Functional Status: Activity: activity as tolerated Diet: Soft Wound Care: keep wound clean and dry Functional status:  ___ No restrictions     ___ Walk up steps independently ___ 24/7 supervision/assistance   ___ Walk up steps with assistance ___ Intermittent supervision/assistance  ___ Bathe/dress independently ___ Walk with walker     _x__ Bathe/dress with assistance ___ Walk Independently    ___ Shower independently ___ Walk with assistance    ___ Shower with assistance ___ No alcohol     ___ Return to work/school ________   COMMUNITY REFERRALS UPON DISCHARGE:    Home Health:   PT     RN                    Agency:  [facility] Phone: [phone]   Medical Equipment/Items Ordered: hospital bed                                                     Agency/Supplier:  [facility] [id number]      Special Instructions: Colostomy care as directed   Home health nurse check INR 06/28/2015 results to [place] [id number] fax number [phone]         My questions have been answered and I understand these instructions. I will adhere to these goals and the provided educational materials after my discharge from the hospital.  Patient/Caregiver Signature _______________________________ Date __________  Clinician Signature _______________________________________ Date __________  Please bring this form and your medication list with you to all your follow-up doctor's appointments.

## 2015-06-28 ENCOUNTER — Encounter: Payer: Self-pay | Admitting: Cardiology

## 2015-07-02 ENCOUNTER — Telehealth: Payer: Self-pay | Admitting: Pharmacist

## 2015-07-02 NOTE — Telephone Encounter (Signed)
Called patient to follow-up with INR. Discharge summary states she should have been checked on 06/28/15, but we have not yet received results. Unable to leave a message. Will continue to attempt to track down results or homehealth company

## 2015-07-05 ENCOUNTER — Ambulatory Visit (INDEPENDENT_AMBULATORY_CARE_PROVIDER_SITE_OTHER): Payer: Medicare Other | Admitting: Pharmacist

## 2015-07-05 DIAGNOSIS — I6310 Cerebral infarction due to embolism of unspecified precerebral artery: Secondary | ICD-10-CM

## 2015-07-05 DIAGNOSIS — Z7901 Long term (current) use of anticoagulants: Secondary | ICD-10-CM

## 2015-07-05 LAB — POCT INR: INR: 2.9

## 2015-07-09 ENCOUNTER — Telehealth: Payer: Self-pay

## 2015-07-09 LAB — POCT INR: INR: 2.8

## 2015-07-09 NOTE — Telephone Encounter (Signed)
Patient request a medication called into her pharmacy for diarrhea. Patient is having discomfort. [facility] on [address]. [phone].

## 2015-07-10 ENCOUNTER — Ambulatory Visit (INDEPENDENT_AMBULATORY_CARE_PROVIDER_SITE_OTHER): Payer: Medicare Other | Admitting: Pharmacist

## 2015-07-10 DIAGNOSIS — I6310 Cerebral infarction due to embolism of unspecified precerebral artery: Secondary | ICD-10-CM

## 2015-07-10 DIAGNOSIS — Z7901 Long term (current) use of anticoagulants: Secondary | ICD-10-CM

## 2015-07-10 NOTE — Telephone Encounter (Signed)
Spoke with pt, I advised her to come in. She states she has an appt tomorrow and will discuss this then.

## 2015-07-11 ENCOUNTER — Encounter: Payer: Self-pay | Admitting: Emergency Medicine

## 2015-07-11 ENCOUNTER — Telehealth: Payer: Self-pay | Admitting: *Deleted

## 2015-07-11 ENCOUNTER — Ambulatory Visit (INDEPENDENT_AMBULATORY_CARE_PROVIDER_SITE_OTHER): Payer: Medicare Other | Admitting: Emergency Medicine

## 2015-07-11 VITALS — BP 106/60 | HR 64 | Temp 97.9°F | Resp 20 | Wt 144.2 lb

## 2015-07-11 DIAGNOSIS — I6310 Cerebral infarction due to embolism of unspecified precerebral artery: Secondary | ICD-10-CM

## 2015-07-11 DIAGNOSIS — K5732 Diverticulitis of large intestine without perforation or abscess without bleeding: Secondary | ICD-10-CM | POA: Diagnosis not present

## 2015-07-11 DIAGNOSIS — D500 Iron deficiency anemia secondary to blood loss (chronic): Secondary | ICD-10-CM | POA: Diagnosis not present

## 2015-07-11 DIAGNOSIS — Z433 Encounter for attention to colostomy: Secondary | ICD-10-CM

## 2015-07-11 DIAGNOSIS — R601 Generalized edema: Secondary | ICD-10-CM | POA: Diagnosis not present

## 2015-07-11 DIAGNOSIS — K5900 Constipation, unspecified: Secondary | ICD-10-CM

## 2015-07-11 NOTE — Patient Instructions (Signed)
     IF you received an x-ray today, you will receive an invoice from [facility]. Please contact [facility] at [phone] with questions or concerns regarding your invoice.   IF you received labwork today, you will receive an invoice from [facility]. Please contact [facility] at [phone] with questions or concerns regarding your invoice.   Our billing staff will not be able to assist you with questions regarding bills from these companies.  You will be contacted with the lab results as soon as they are available. The fastest way to get your results is to activate your My Chart account. Instructions are located on the last page of this paperwork. If you have not heard from us regarding the results in 2 weeks, please contact this office.      

## 2015-07-11 NOTE — Progress Notes (Addendum)
Patient ID: [name], female   DOB: [birth year], 82 y.o.   MRN: [id number]    By signing my name below I, [name], attest that this documentation has been prepared under the direction and in the presence of [name], MD. Electonically Signed. [name], Scribe 07/11/2015 at 11:04 AM   Chief Complaint:  Chief Complaint  Patient presents with  . Follow-up    hospital vs from 06/14/15  . colon resection    per pt "feel better"    HPI: [name] is a 82 y.o. female who reports to [facility] today for follow up visit after pt had sigmoid colectomy performed on 06/14/15.  Pt states that she is eating well and in no pain. Pt reports that she is changing her colostomy bag TID.   Pt had multiple ED visits and hospital admissions due to diverticulitis that would not clear up with antibiotics. Pt also had recurring C-diff due to antibiotic treatments.   Pt reports that she was using an ace-wrap on her lower legs to control her bilat lower extremity edema and states that it was working well.  Pt denies any SOB. Pt states that she has never used the home O2 that she was given and that she does not need it.  Pt is taking a half pill of her hydrocodone 2-3 times a week for her chronic back pain.  Past Medical History  Diagnosis Date  . Pericardial effusion     a. s/p pericardial window 01/16/15  . History of GI bleed     a. secondary to AVM's. Treated with Fe infusion  . HTN (hypertension)   . Obesity   . LVH (left ventricular hypertrophy)   . Sleep apnea     mild-no cpap  . Positive TB test   . Anemia   . Stroke [facility])     a. 2013: right frontal  . Arthritis   . Tachycardia-bradycardia syndrome [facility])     a. s/p Medtronic [place], model number [id number], serial number [id number] 12/2013  . History of shingles   . Chronic diastolic (congestive) heart failure ([place])     a. Echo 01/2015 with EF of 55-60%.  . Chronic atrial fibrillation (HCC)     a. on Coumadin, Digoxin,  and Verapamil   Past Surgical History  Procedure Laterality Date  . Breast lumpectomy Bilateral     negative for cancer  . Esophagogastroduodenoscopy  05/19/2011    Procedure: ESOPHAGOGASTRODUODENOSCOPY (EGD);  Surgeon: Lear Ng, MD;  Location: [facility] ENDOSCOPY;  Service: Endoscopy;  Laterality: N/A;  doctor aware of inr   will try to be here no later than 230  . Cholecystectomy  2005  . Esophagogastroduodenoscopy  06/22/2011    Procedure: ESOPHAGOGASTRODUODENOSCOPY (EGD);  Surgeon: Arta Silence, MD;  Location: [facility] ENDOSCOPY;  Service: Endoscopy;  Laterality: N/A;  Check PT/INR in am  . Givens capsule study  06/23/2011    Procedure: GIVENS CAPSULE STUDY;  Surgeon: Arta Silence, MD;  Location: [facility] ENDOSCOPY;  Service: Endoscopy;  Laterality: N/A;  . Colonoscopy  08/11/2011    Procedure: COLONOSCOPY;  Surgeon: Jeryl Columbia, MD;  Location: [facility] ENDOSCOPY;  Service: Endoscopy;  Laterality: N/A;  . Hot hemostasis  08/11/2011    Procedure: HOT HEMOSTASIS (ARGON PLASMA COAGULATION/BICAP);  Surgeon: Jeryl Columbia, MD;  Location: [facility] ENDOSCOPY;  Service: Endoscopy;  Laterality: N/A;  . Mass excision Left 09/14/2013    Procedure: EXCISION MASS LEFT WRIST;  Surgeon: Leanora Cover, MD;  Location: [place]  [facility];  Service: Orthopedics;  Laterality: Left;  . Tonsillectomy and adenoidectomy  1950  . Insert / replace / remove pacemaker  2001    Last generator in 2006; interrogated Dec 2012  . Cataract extraction w/ intraocular lens  implant, bilateral Bilateral   . Lipoma excision Left 07/2013    wrist  . Pacemaker generator change N/A 01/02/2014    Procedure: PACEMAKER GENERATOR CHANGE;  Surgeon: Sanda Klein, MD;  Location: [place] CATH LAB;  Service: Cardiovascular;  Laterality: N/A;  . Lead revision N/A 01/02/2014    Procedure: LEAD REVISION;  Surgeon: Sanda Klein, MD;  Location: [place] CATH LAB;  Service: Cardiovascular;  Laterality: N/A;  . Subxyphoid pericardial window N/A 01/16/2015     Procedure: SUBXYPHOID PERICARDIAL WINDOW;  Surgeon: Grace Isaac, MD;  Location: [place];  Service: Thoracic;  Laterality: N/A;  . Tee without cardioversion N/A 01/16/2015    Procedure: TRANSESOPHAGEAL ECHOCARDIOGRAM (TEE);  Surgeon: Grace Isaac, MD;  Location: [place];  Service: Thoracic;  Laterality: N/A;  . Colon resection N/A 05/31/2015    Procedure: SIGMOID COLECTOMY;  Surgeon: Donnie Mesa, MD;  Location: [place];  Service: General;  Laterality: N/A;  . Colostomy N/A 05/31/2015    Procedure: DESCENDING COLOSTOMY;  Surgeon: Donnie Mesa, MD;  Location: [place];  Service: General;  Laterality: N/A;  . Bowel resection N/A 05/31/2015    Procedure: SMALL BOWEL RESECTION;  Surgeon: Donnie Mesa, MD;  Location: [place];  Service: General;  Laterality: N/A;  . Debridement of abdominal wall abscess N/A 05/31/2015    Procedure: DRAINAGE OF PELVIC ABSCESS;  Surgeon: Donnie Mesa, MD;  Location: [place];  Service: General;  Laterality: N/A;   Social History   Social History  . Marital Status: Widowed    Spouse Name: N/A  . Number of Children: N/A  . Years of Education: N/A   Social History Main Topics  . Smoking status: Never Smoker   . Smokeless tobacco: Never Used  . Alcohol Use: 0.0 oz/week    0 Standard drinks or equivalent per week     Comment: 01/02/2014 "I'll have a drink a few times/year"  . Drug Use: No  . Sexual Activity: No   Other Topics Concern  . Not on file   Social History Narrative   Family History  Problem Relation Age of Onset  . Stroke Mother   . Stroke Father   . Pneumonia Father   . Colon cancer Sister   . Colon cancer Daughter    Allergies  Allergen Reactions  . Anti-Inflammatory Enzyme [Nutritional Supplements] Other (See Comments)    Retains fluids and headaches  . Iodinated Diagnostic Agents Hives    HIVES 15MIN S/P IV CONTRAST INJECTION,WILL NEED 13 HR PREP FOR FUTURE INJECTIONS, ok s/p 50mg  po benadryl//a.calhoun  . Spironolactone Other (See Comments)      Hair loss  . Arthrotec [Diclofenac-Misoprostol] Other (See Comments)    unknown  . Biaxin [Clarithromycin] Other (See Comments)    unknown  . Plavix [Clopidogrel Bisulfate] Other (See Comments)    unknown   Prior to Admission medications   Medication Sig Start Date End Date Taking? Authorizing Provider  digoxin 62.5 MCG TABS Take 0.0625 mg by mouth daily. 06/26/15   Lavon Paganini Angiulli, PA-C  feeding supplement (BOOST / RESOURCE BREEZE) LIQD Take 1 Container by mouth 3 (three) times daily between meals. 06/14/15   [name], MD  magnesium gluconate (MAGONATE) 500 MG tablet Take 1 tablet (500 mg total)  by mouth daily. 06/26/15   Lavon Paganini Angiulli, PA-C  pantoprazole (PROTONIX) 40 MG tablet Take 1 tablet (40 mg total) by mouth daily. 06/26/15   Lavon Paganini Angiulli, PA-C  potassium chloride SA (K-DUR,KLOR-CON) 20 MEQ tablet Take 2 tablets (40 mEq total) by mouth daily. 06/26/15   Lavon Paganini Angiulli, PA-C  saccharomyces boulardii (FLORASTOR) 250 MG capsule Take 1 capsule (250 mg total) by mouth 2 (two) times daily. 06/26/15   Lavon Paganini Angiulli, PA-C  torsemide (DEMADEX) 20 MG tablet Take 2 tablets (40 mg total) by mouth daily. 06/26/15   Lavon Paganini Angiulli, PA-C  vancomycin (VANCOCIN) 50 mg/mL oral solution 125 mg daily 5 days then 125 mg every other day 7 doses and stop 06/26/15   Lavon Paganini Angiulli, PA-C  verapamil (CALAN-SR) 120 MG CR tablet Take 1 tablet (120 mg total) by mouth daily. 06/26/15   Lavon Paganini Angiulli, PA-C  warfarin (COUMADIN) 5 MG tablet Take 1 tablet (5 mg total) by mouth daily at 6 PM. 06/26/15   Lavon Paganini Angiulli, PA-C     ROS: The patient denies fevers, chills, night sweats, unintentional weight loss, chest pain, palpitations, wheezing, dyspnea on exertion, nausea, vomiting, abdominal pain, dysuria, hematuria, melena, numbness, weakness, or tingling.   All other systems have been reviewed and were otherwise negative with the exception of those mentioned in the HPI and as  above.    PHYSICAL EXAM: Filed Vitals:   07/11/15 1021  BP: 106/60  Temp: 97.9 F (36.6 C)  Resp: 20   Body mass index is 24 kg/(m^2).   General: Alert, no acute distress HEENT:  Normocephalic, atraumatic, oropharynx patent. Eye: [facility]te M[facility] Cardiovascular:  Regular rate and rhythm, no rubs murmurs or gallops.  No Carotid bruits, radial pulse intact.   Respiratory: Clear to auscultation bilaterally.  No wheezes, rales, or rhonchi.  No cyanosis, no use of accessory musculature Abdominal: No organomegaly, abdomen is soft and non-tender, positive bowel sounds. Pt has a colostomy bag, fullness, with no masses. Musculoskeletal: Gait intact. No tenderness.Pt has bilat 4+ pitting edema from knees down. Skin: No rashes. Neurologic: Facial musculature symmetric. Psychiatric: Patient acts appropriately throughout our interaction. Lymphatic: No cervical or submandibular lymphadenopathy  LABS: Results for orders placed or performed in visit on 07/10/15  POCT INR  Result Value Ref Range   INR 2.8       EKG/XRAY:   Primary read interpreted by Dr. Everlene Farrier at [facility].   ASSESSMENT/PLAN: Patient overall is improving. She is starting DE better. She is more ambulatory. Physical therapy has helped with her strength. She does have some trouble with her colostomy with swelling around the colostomy I suspect is secondary to constipation. She was encouraged to use her MiraLAX daily and not use Imodium. Referral made to her general surgeon for reevaluation. Plan was to reanastomose her colostomy 6 months post procedure.I personally performed the services described in this documentation, which was scribed in my presence. The recorded information has been reviewed and is accurate.She has significant amount of edema on exam. I suspect this is secondary to her low albumin, nutritional status, and anemia. She is going to use Ace wraps to keep her legs wrapped. She cannot tolerate support stockings.   Gross  sideeffects, risk and benefits, and alternatives of medications d/w patient. Patient is aware that all medications have potential sideeffects and we are unable to predict every sideeffect or drug-drug interaction that may occur.  [name] MD 07/11/2015 10:24 AM

## 2015-07-11 NOTE — Telephone Encounter (Signed)
Spoke with the patient's daughter to verify that [facility] is where the patient receives her oxygen supplies. The patient had asked that the order for oxygen be discontinued by Dr. Everlene Farrier, order was faxed to [facility].

## 2015-07-12 LAB — CBC WITH DIFFERENTIAL/PLATELET
BASOS ABS: 54 {cells}/uL (ref 0–200)
BASOS PCT: 1 %
EOS ABS: 162 {cells}/uL (ref 15–500)
Eosinophils Relative: 3 %
HCT: 28.6 % — ABNORMAL LOW (ref 35.0–45.0)
HEMOGLOBIN: 9.1 g/dL — AB (ref 11.7–15.5)
LYMPHS ABS: 1728 {cells}/uL (ref 850–3900)
Lymphocytes Relative: 32 %
MCH: 26.2 pg — AB (ref 27.0–33.0)
MCHC: 31.8 g/dL — ABNORMAL LOW (ref 32.0–36.0)
MCV: 82.4 fL (ref 80.0–100.0)
MONOS PCT: 13 %
MPV: 10.2 fL (ref 7.5–12.5)
Monocytes Absolute: 702 cells/uL (ref 200–950)
NEUTROS ABS: 2754 {cells}/uL (ref 1500–7800)
Neutrophils Relative %: 51 %
PLATELETS: 408 10*3/uL — AB (ref 140–400)
RBC: 3.47 MIL/uL — ABNORMAL LOW (ref 3.80–5.10)
RDW: 15.6 % — ABNORMAL HIGH (ref 11.0–15.0)
WBC: 5.4 10*3/uL (ref 3.8–10.8)

## 2015-07-12 LAB — COMPLETE METABOLIC PANEL WITH GFR
ALBUMIN: 3.1 g/dL — AB (ref 3.6–5.1)
ALK PHOS: 194 U/L — AB (ref 33–130)
ALT: 39 U/L — AB (ref 6–29)
AST: 45 U/L — ABNORMAL HIGH (ref 10–35)
BILIRUBIN TOTAL: 0.3 mg/dL (ref 0.2–1.2)
BUN: 21 mg/dL (ref 7–25)
CO2: 30 mmol/L (ref 20–31)
CREATININE: 0.97 mg/dL — AB (ref 0.60–0.88)
Calcium: 8.8 mg/dL (ref 8.6–10.4)
Chloride: 102 mmol/L (ref 98–110)
GFR, EST AFRICAN AMERICAN: 59 mL/min — AB (ref >=60)
GFR, EST NON AFRICAN AMERICAN: 52 mL/min — AB (ref >=60)
GLUCOSE: 93 mg/dL (ref 65–99)
Potassium: 4.1 mmol/L (ref 3.5–5.3)
SODIUM: 141 mmol/L (ref 135–146)
TOTAL PROTEIN: 6.6 g/dL (ref 6.1–8.1)

## 2015-07-16 LAB — POCT INR: INR: 2.6

## 2015-07-17 ENCOUNTER — Encounter: Payer: Self-pay | Admitting: *Deleted

## 2015-07-18 ENCOUNTER — Ambulatory Visit (INDEPENDENT_AMBULATORY_CARE_PROVIDER_SITE_OTHER): Payer: Medicare Other | Admitting: Pharmacist Clinician (PhC)/ Clinical Pharmacy Specialist

## 2015-07-18 DIAGNOSIS — Z7901 Long term (current) use of anticoagulants: Secondary | ICD-10-CM

## 2015-07-18 DIAGNOSIS — I6310 Cerebral infarction due to embolism of unspecified precerebral artery: Secondary | ICD-10-CM

## 2015-07-23 ENCOUNTER — Telehealth: Payer: Self-pay

## 2015-07-23 DIAGNOSIS — R601 Generalized edema: Secondary | ICD-10-CM

## 2015-07-23 LAB — POCT INR: INR: 2.7

## 2015-07-23 NOTE — Telephone Encounter (Signed)
[name] with [employer] called stating that patient weight gain is from 140.8 to 145.0  besty [phone]

## 2015-07-23 NOTE — Telephone Encounter (Signed)
Thanks .Marland Kitchen Hilda Blades, can you check with Ms. Hunnicutt (Dr. Lurline Del patient) about her weight gain and what the time period was and whether she has any CHF symptoms and may need a diuretic adjustment or office visit?  Dr. Lemmie Evens (for Dr. Loletha Grayer)

## 2015-07-23 NOTE — Telephone Encounter (Signed)
Have her continue to check her weight regularly. Please share this information with her cardiologist.

## 2015-07-24 ENCOUNTER — Ambulatory Visit (INDEPENDENT_AMBULATORY_CARE_PROVIDER_SITE_OTHER): Payer: Medicare Other | Admitting: Pharmacist

## 2015-07-24 DIAGNOSIS — I6310 Cerebral infarction due to embolism of unspecified precerebral artery: Secondary | ICD-10-CM

## 2015-07-24 DIAGNOSIS — Z7901 Long term (current) use of anticoagulants: Secondary | ICD-10-CM

## 2015-07-24 NOTE — Telephone Encounter (Signed)
Spoke with pt, she reports edema in her feet and legs for a long time but over the last several weeks they have gotten worse. She reports a weight gain of 5 lbs over the last week. She has taken an extra torsmide for the last 3 days, total of 60 mg daily. She is down 1/2 lb today. She denies SOB or orthopnea. She elevates her legs and uses support hose. Salt and fluid restrictions discussed. She has a follow up appt with dr c first part of august. Will make dr hilty aware

## 2015-07-25 NOTE — Telephone Encounter (Signed)
She should stay on 60 mg torsemide (3 tablets daily) through the weekend and have a repeat BMET/BNP on Monday or Tuesday next week - touch base with Dr. Loletha Grayer at that time.  Thanks.  Dr. Lemmie Evens

## 2015-07-25 NOTE — Telephone Encounter (Signed)
Spoke with pt, aware of dr hilty's recommendation. Lab orders placed.

## 2015-07-30 LAB — PROTIME-INR

## 2015-07-30 LAB — BASIC METABOLIC PANEL
BUN: 21 mg/dL (ref 7–25)
CALCIUM: 9 mg/dL (ref 8.6–10.4)
CHLORIDE: 103 mmol/L (ref 98–110)
CO2: 30 mmol/L (ref 20–31)
Creat: 0.99 mg/dL — ABNORMAL HIGH (ref 0.60–0.88)
Glucose, Bld: 106 mg/dL — ABNORMAL HIGH (ref 65–99)
POTASSIUM: 3.6 mmol/L (ref 3.5–5.3)
SODIUM: 144 mmol/L (ref 135–146)

## 2015-07-30 LAB — BRAIN NATRIURETIC PEPTIDE: Brain Natriuretic Peptide: 426.4 pg/mL — ABNORMAL HIGH (ref <100)

## 2015-07-30 LAB — POCT INR: INR: 2.7

## 2015-07-31 NOTE — Telephone Encounter (Signed)
Spoke with pt, she states she is doing much better.

## 2015-07-31 NOTE — Telephone Encounter (Signed)
Please call and check on status. Be sure her swelling is improved. If she continues to have swelling and not improving please be sure she contacts Dr. Victorino December office

## 2015-08-01 ENCOUNTER — Ambulatory Visit (INDEPENDENT_AMBULATORY_CARE_PROVIDER_SITE_OTHER): Payer: Medicare Other | Admitting: Pharmacist

## 2015-08-01 DIAGNOSIS — Z7901 Long term (current) use of anticoagulants: Secondary | ICD-10-CM

## 2015-08-01 DIAGNOSIS — I6310 Cerebral infarction due to embolism of unspecified precerebral artery: Secondary | ICD-10-CM

## 2015-08-05 NOTE — Telephone Encounter (Signed)
Outpatient and tell her she needs to follow-up with me sometime towards the end of this month or first of August.

## 2015-08-06 ENCOUNTER — Ambulatory Visit (INDEPENDENT_AMBULATORY_CARE_PROVIDER_SITE_OTHER): Payer: Medicare Other | Admitting: Pharmacist

## 2015-08-06 DIAGNOSIS — I6310 Cerebral infarction due to embolism of unspecified precerebral artery: Secondary | ICD-10-CM

## 2015-08-06 DIAGNOSIS — Z7901 Long term (current) use of anticoagulants: Secondary | ICD-10-CM

## 2015-08-06 LAB — PROTIME-INR

## 2015-08-06 LAB — POCT INR: INR: 3

## 2015-08-12 ENCOUNTER — Other Ambulatory Visit: Payer: Self-pay | Admitting: Cardiovascular Disease

## 2015-08-12 NOTE — Telephone Encounter (Signed)
Rx(s) sent to pharmacy electronically.  

## 2015-08-14 ENCOUNTER — Other Ambulatory Visit: Payer: Self-pay | Admitting: Internal Medicine

## 2015-08-17 ENCOUNTER — Other Ambulatory Visit: Payer: Self-pay | Admitting: Student

## 2015-08-19 ENCOUNTER — Encounter (HOSPITAL_COMMUNITY): Payer: Self-pay

## 2015-08-20 ENCOUNTER — Telehealth: Payer: Self-pay

## 2015-08-20 NOTE — Telephone Encounter (Signed)
Called patient's home per Dr. Everlene Farrier and advised patient see him for a visit within the next two weeks.  Explained the new process for same day appointments and to call after 4pm the day before they want to be seen.  They verbalized understanding.

## 2015-08-21 ENCOUNTER — Encounter: Payer: Self-pay | Admitting: Cardiovascular Disease

## 2015-08-21 ENCOUNTER — Ambulatory Visit (INDEPENDENT_AMBULATORY_CARE_PROVIDER_SITE_OTHER): Payer: Medicare Other | Admitting: Pharmacist Clinician (PhC)/ Clinical Pharmacy Specialist

## 2015-08-21 DIAGNOSIS — Z7901 Long term (current) use of anticoagulants: Secondary | ICD-10-CM

## 2015-08-21 DIAGNOSIS — I6310 Cerebral infarction due to embolism of unspecified precerebral artery: Secondary | ICD-10-CM

## 2015-08-21 LAB — PROTIME-INR: INR: 4.1 — AB (ref 0.9–1.1)

## 2015-08-27 LAB — POCT INR: INR: 3.1

## 2015-08-28 ENCOUNTER — Ambulatory Visit (INDEPENDENT_AMBULATORY_CARE_PROVIDER_SITE_OTHER): Payer: Medicare Other | Admitting: Emergency Medicine

## 2015-08-28 ENCOUNTER — Ambulatory Visit (INDEPENDENT_AMBULATORY_CARE_PROVIDER_SITE_OTHER): Payer: Medicare Other

## 2015-08-28 VITALS — BP 132/66 | HR 85 | Temp 97.8°F | Resp 18 | Ht 63.5 in | Wt 153.2 lb

## 2015-08-28 DIAGNOSIS — D500 Iron deficiency anemia secondary to blood loss (chronic): Secondary | ICD-10-CM | POA: Diagnosis not present

## 2015-08-28 DIAGNOSIS — M546 Pain in thoracic spine: Secondary | ICD-10-CM

## 2015-08-28 DIAGNOSIS — I6310 Cerebral infarction due to embolism of unspecified precerebral artery: Secondary | ICD-10-CM

## 2015-08-28 DIAGNOSIS — K5732 Diverticulitis of large intestine without perforation or abscess without bleeding: Secondary | ICD-10-CM | POA: Diagnosis not present

## 2015-08-28 DIAGNOSIS — R93429 Abnormal radiologic findings on diagnostic imaging of unspecified kidney: Secondary | ICD-10-CM | POA: Diagnosis not present

## 2015-08-28 MED ORDER — OXYCODONE-ACETAMINOPHEN 5-325 MG PO TABS: ORAL_TABLET | ORAL | 0 refills | Status: DC

## 2015-08-28 MED ORDER — OXYCODONE HCL 5 MG PO TABS: ORAL_TABLET | ORAL | 0 refills | Status: DC

## 2015-08-28 NOTE — Patient Instructions (Signed)
     IF you received an x-ray today, you will receive an invoice from [facility]. Please contact [facility] at [phone] with questions or concerns regarding your invoice.   IF you received labwork today, you will receive an invoice from [facility]. Please contact [facility] at [phone] with questions or concerns regarding your invoice.   Our billing staff will not be able to assist you with questions regarding bills from these companies.  You will be contacted with the lab results as soon as they are available. The fastest way to get your results is to activate your My Chart account. Instructions are located on the last page of this paperwork. If you have not heard from us regarding the results in 2 weeks, please contact this office.      

## 2015-08-28 NOTE — Progress Notes (Signed)
Patient ID: [name], female   DOB: 1923-04-25, 82 y.o.   MRN: [id number]    By signing my name below, I, Essence Howell, attest that this documentation has been prepared under the direction and in the presence of Darlyne Russian, MD Electronically Signed: Ladene Artist, ED Scribe 08/28/2015 at 11:47 AM.  Chief Complaint:  Chief Complaint  Patient presents with  . Follow-up  . Back Pain    Pain in upper back for long time -NKI-   HPI: [name] is a 82 y.o. female who reports to [facility] today complaining of intermittent upper back pain for several years, worsened over several weeks. Pt states that she she initially noticed back pain after physical therapy and it has been more frequent ever since. She states that pain is exacerbated with palpation, going over small bumps on the roads in the car and even going around curves. She has tried a back brace and .5 previously prescribed Oxycodone tablets to manage pain with mild relief. Pt has an aid from 10:30 AM-3:30 PM daily.  Past Medical History:  Diagnosis Date  . Anemia   . Arthritis   . Chronic atrial fibrillation (HCC)    a. on Coumadin, Digoxin, and Verapamil  . Chronic diastolic (congestive) heart failure ([place])    a. Echo 01/2015 with EF of 55-60%.  . History of GI bleed    a. secondary to AVM's. Treated with Fe infusion  . History of shingles   . HTN (hypertension)   . LVH (left ventricular hypertrophy)   . Obesity   . Pericardial effusion    a. s/p pericardial window 01/16/15  . Positive TB test   . Sleep apnea    mild-no cpap  . Stroke [facility])    a. 2013: right frontal  . Tachycardia-bradycardia syndrome [facility])    a. s/p Medtronic [place], model number [id number], serial number [id number] 12/2013   Past Surgical History:  Procedure Laterality Date  . BOWEL RESECTION N/A 05/31/2015   Procedure: SMALL BOWEL RESECTION;  Surgeon: Donnie Mesa, MD;  Location: [place];  Service: General;  Laterality: N/A;  . BREAST LUMPECTOMY  Bilateral    negative for cancer  . CATARACT EXTRACTION W/ INTRAOCULAR LENS  IMPLANT, BILATERAL Bilateral   . CHOLECYSTECTOMY  2005  . COLON RESECTION N/A 05/31/2015   Procedure: SIGMOID COLECTOMY;  Surgeon: Donnie Mesa, MD;  Location: [place];  Service: General;  Laterality: N/A;  . COLONOSCOPY  08/11/2011   Procedure: COLONOSCOPY;  Surgeon: Jeryl Columbia, MD;  Location: [facility] ENDOSCOPY;  Service: Endoscopy;  Laterality: N/A;  . COLOSTOMY N/A 05/31/2015   Procedure: DESCENDING COLOSTOMY;  Surgeon: Donnie Mesa, MD;  Location: [place];  Service: General;  Laterality: N/A;  . DEBRIDEMENT OF ABDOMINAL WALL ABSCESS N/A 05/31/2015   Procedure: DRAINAGE OF PELVIC ABSCESS;  Surgeon: Donnie Mesa, MD;  Location: [place];  Service: General;  Laterality: N/A;  . ESOPHAGOGASTRODUODENOSCOPY  05/19/2011   Procedure: ESOPHAGOGASTRODUODENOSCOPY (EGD);  Surgeon: Lear Ng, MD;  Location[facility] ENDOSCOPY;  Service: Endoscopy;  Laterality: N/A;  doctor aware of inr   will try to be here no later than 230  . ESOPHAGOGASTRODUODENOSCOPY  06/22/2011   Procedure: ESOPHAGOGASTRODUODENOSCOPY (EGD);  Surgeon: Arta Silence, MD;  Location: [facility] ENDOSCOPY;  Service: Endoscopy;  Laterality: N/A;  Check PT/INR in am  . GIVENS CAPSULE STUDY  06/23/2011   Procedure: GIVENS CAPSULE STUDY;  Surgeon: Arta Silence, MD;  Location: [facility] ENDOSCOPY;  Service: Endoscopy;  Laterality: N/A;  .  HOT HEMOSTASIS  08/11/2011   Procedure: HOT HEMOSTASIS (ARGON PLASMA COAGULATION/BICAP);  Surgeon: Jeryl Columbia, MD;  Location: [facility];  Service: Endoscopy;  Laterality: N/A;  . INSERT / REPLACE / REMOVE PACEMAKER  2001   Last generator in 2006; interrogated Dec 2012  . LEAD REVISION N/A 01/02/2014   Procedure: LEAD REVISION;  Surgeon: Sanda Klein, MD;  Location: [place] CATH LAB;  Service: Cardiovascular;  Laterality: N/A;  . LIPOMA EXCISION Left 07/2013   wrist  . MASS EXCISION Left 09/14/2013   Procedure: EXCISION MASS LEFT WRIST;  Surgeon: Leanora Cover, MD;  [place] [place];  Service: Orthopedics;  Laterality: Left;  . PACEMAKER GENERATOR CHANGE N/A 01/02/2014   Procedure: PACEMAKER GENERATOR CHANGE;  Surgeon: Sanda Klein, MD;  Location: [place] CATH LAB;  Service: Cardiovascular;  Laterality: N/A;  . SUBXYPHOID PERICARDIAL WINDOW N/A 01/16/2015   Procedure: SUBXYPHOID PERICARDIAL WINDOW;  Surgeon: Grace Isaac, MD;  Location: [place];  Service: Thoracic;  Laterality: N/A;  . TEE WITHOUT CARDIOVERSION N/A 01/16/2015   Procedure: TRANSESOPHAGEAL ECHOCARDIOGRAM (TEE);  Surgeon: Grace Isaac, MD;  Location: [place];  Service: Thoracic;  Laterality: N/A;  . TONSILLECTOMY AND ADENOIDECTOMY  1950   Social History   Social History  . Marital status: Widowed    Spouse name: N/A  . Number of children: N/A  . Years of education: N/A   Social History Main Topics  . Smoking status: Never Smoker  . Smokeless tobacco: Never Used  . Alcohol use 0.0 oz/week     Comment: 01/02/2014 "I'll have a drink a few times/year"  . Drug use: No  . Sexual activity: No   Other Topics Concern  . None   Social History Narrative  . None   Family History  Problem Relation Age of Onset  . Stroke Mother   . Stroke Father   . Pneumonia Father   . Colon cancer Sister   . Colon cancer Daughter    Allergies  Allergen Reactions  . Anti-Inflammatory Enzyme [Nutritional Supplements] Other (See Comments)    Retains fluids and headaches  . Iodinated Diagnostic Agents Hives    HIVES 15MIN S/P IV CONTRAST INJECTION,WILL NEED 13 HR PREP FOR FUTURE INJECTIONS, ok s/p 50mg  po benadryl//a.calhoun  . Spironolactone Other (See Comments)    Hair loss  . Arthrotec [Diclofenac-Misoprostol] Other (See Comments)    unknown  . Biaxin [Clarithromycin] Other (See Comments)    unknown  . Plavix [Clopidogrel Bisulfate] Other (See Comments)    unknown   Prior to Admission medications   Medication Sig Start Date End Date Taking? Authorizing  Provider  digoxin 62.5 MCG TABS Take 0.0625 mg by mouth daily. 06/26/15  Yes Daniel J Angiulli, PA-C  feeding supplement (BOOST / RESOURCE BREEZE) LIQD Take 1 Container by mouth 3 (three) times daily between meals. 06/14/15  Yes [name], MD  magnesium gluconate (MAGONATE) 500 MG tablet Take 1 tablet (500 mg total) by mouth daily. 06/26/15  Yes Daniel J Angiulli, PA-C  potassium chloride (K-DUR) 10 MEQ tablet Take 2 tablets (20 mEq total) by mouth daily. With torsemide 08/12/15  Yes Mihai Croitoru, MD  verapamil (CALAN-SR) 120 MG CR tablet Take 1 tablet (120 mg total) by mouth daily. 06/26/15  Yes Daniel J Angiulli, PA-C  warfarin (COUMADIN) 5 MG tablet Take 1 tablet (5 mg total) by mouth daily at 6 PM. 06/26/15  Yes Daniel J Angiulli, PA-C  pantoprazole (PROTONIX) 40 MG tablet Take 1 tablet (40  mg total) by mouth daily. Patient not taking: Reported on 07/11/2015 06/26/15   Lavon Paganini Angiulli, PA-C  saccharomyces boulardii (FLORASTOR) 250 MG capsule Take 1 capsule (250 mg total) by mouth 2 (two) times daily. Patient not taking: Reported on 08/28/2015 06/26/15   Lavon Paganini Angiulli, PA-C   ROS: The patient denies fevers, chills, night sweats, unintentional weight loss, chest pain, palpitations, wheezing, dyspnea on exertion, nausea, vomiting, abdominal pain, dysuria, hematuria, melena, numbness, weakness, or tingling. +back pain  All other systems have been reviewed and were otherwise negative with the exception of those mentioned in the HPI and as above.    PHYSICAL EXAM: Vitals:   08/28/15 0947  BP: 132/66  Pulse: 85  Resp: 18  Temp: 97.8 F (36.6 C)   Body mass index is 26.71 kg/m.  General: Alert, no acute distress HEENT:  Normocephalic, atraumatic, oropharynx patent. Neck supple.  Eye: [facility] Cardiovascular: Irregular rate. Pacemaker in R upper anterior chest. No rubs murmurs or gallops. No Carotid bruits, radial pulse intact. No pedal edema.  Respiratory: Clear to  auscultation bilaterally. No wheezes, rales, or rhonchi. No cyanosis, no use of accessory musculature Abdominal: Colostomy left lower mid abdomen.  Musculoskeletal: Gait intact. No edema. Tender over T8-T10 midline  Skin: No rashes. Neurologic: Facial musculature symmetric. Psychiatric: Patient acts appropriately throughout our interaction. Lymphatic: No cervical or submandibular lymphadenopathy  LABS:  EKG/XRAY:   Primary read interpreted by Dr. Everlene Farrier at [facility]. Dg Thoracic Spine 2 View  Result Date: 08/28/2015 CLINICAL DATA:  Intermittent upper back pain several years worse over the past few weeks. EXAM: THORACIC SPINE 2 VIEWS COMPARISON:  Chest x-ray 06/06/2015 and 05/01/2015 FINDINGS: The vertebral body alignment, heights and disc space heights are within normal. There is mild spondylosis throughout the thoracic spine. There is calcified plaque over the thoracic aorta. There is stable cardiomegaly. IMPRESSION: No acute findings. Mild spondylosis throughout the thoracic spine unchanged. Aortic atherosclerosis. Electronically Signed   By: Marin Olp M.D.   On: 08/28/2015 11:09   ASSESSMENT/PLAN: No fracture seen on T-spine films. She was given 20 oxycodone 5 mg to take a half tablet every 12 hours as needed.  No other change in medication. She does have some lesions present on CT of the kidney and referral made to urology for this. She does have a follow-up office visit with her general surgeon.I personally performed the services described in this documentation, which was scribed in my presence. The recorded information has been reviewed and is accurate.   Gross sideeffects, risk and benefits, and alternatives of medications d/w patient. Patient is aware that all medications have potential sideeffects and we are unable to predict every sideeffect or drug-drug interaction that may occur.  [name] MD 08/28/2015 10:34 AM

## 2015-08-30 ENCOUNTER — Ambulatory Visit (INDEPENDENT_AMBULATORY_CARE_PROVIDER_SITE_OTHER): Payer: Medicare Other | Admitting: Pharmacist

## 2015-08-30 DIAGNOSIS — Z7901 Long term (current) use of anticoagulants: Secondary | ICD-10-CM

## 2015-08-30 DIAGNOSIS — I6310 Cerebral infarction due to embolism of unspecified precerebral artery: Secondary | ICD-10-CM

## 2015-09-02 ENCOUNTER — Encounter: Payer: Self-pay | Admitting: Cardiovascular Disease

## 2015-09-02 ENCOUNTER — Ambulatory Visit (INDEPENDENT_AMBULATORY_CARE_PROVIDER_SITE_OTHER): Payer: Medicare Other | Admitting: Cardiovascular Disease

## 2015-09-02 VITALS — BP 102/61 | HR 75 | Ht 63.75 in | Wt 149.4 lb

## 2015-09-02 DIAGNOSIS — I5032 Chronic diastolic (congestive) heart failure: Secondary | ICD-10-CM | POA: Diagnosis not present

## 2015-09-02 DIAGNOSIS — Z950 Presence of cardiac pacemaker: Secondary | ICD-10-CM | POA: Diagnosis not present

## 2015-09-02 DIAGNOSIS — I319 Disease of pericardium, unspecified: Secondary | ICD-10-CM

## 2015-09-02 DIAGNOSIS — I482 Chronic atrial fibrillation, unspecified: Secondary | ICD-10-CM

## 2015-09-02 DIAGNOSIS — I10 Essential (primary) hypertension: Secondary | ICD-10-CM

## 2015-09-02 DIAGNOSIS — Z8673 Personal history of transient ischemic attack (TIA), and cerebral infarction without residual deficits: Secondary | ICD-10-CM

## 2015-09-02 DIAGNOSIS — I6310 Cerebral infarction due to embolism of unspecified precerebral artery: Secondary | ICD-10-CM

## 2015-09-02 DIAGNOSIS — I313 Pericardial effusion (noninflammatory): Secondary | ICD-10-CM

## 2015-09-02 DIAGNOSIS — I3139 Other pericardial effusion (noninflammatory): Secondary | ICD-10-CM

## 2015-09-02 DIAGNOSIS — Z7901 Long term (current) use of anticoagulants: Secondary | ICD-10-CM

## 2015-09-02 LAB — BASIC METABOLIC PANEL
BUN: 36 mg/dL — ABNORMAL HIGH (ref 7–25)
CHLORIDE: 93 mmol/L — AB (ref 98–110)
CO2: 33 mmol/L — ABNORMAL HIGH (ref 20–31)
CREATININE: 1.16 mg/dL — AB (ref 0.60–0.88)
Calcium: 9.2 mg/dL (ref 8.6–10.4)
Glucose, Bld: 100 mg/dL — ABNORMAL HIGH (ref 65–99)
POTASSIUM: 3.1 mmol/L — AB (ref 3.5–5.3)
Sodium: 138 mmol/L (ref 135–146)

## 2015-09-02 MED ORDER — METOLAZONE 2.5 MG PO TABS: 2.5000 mg | ORAL_TABLET | ORAL | 11 refills | Status: DC

## 2015-09-02 NOTE — Progress Notes (Signed)
Patient ID: [name], female   DOB: 1923/07/15, 82 y.o.   MRN: [id number]     Cardiology Office Note    Date:  09/02/2015   ID:  [name], DOB 03/27/1923, MRN [id number]  PCP:  [name] Reichmann, MD  Cardiologist:   Sanda Klein, MD   Chief Complaint  Patient presents with  . Follow-up    MDT-G  pt c/o mild SOB; severe swelling in legs/feet/ankles; no other Sx.    History of Present Illness:  [name] is a 82 y.o. female with long-standing permanent atrial fibrillation with slow ventricular response, single chamber pacemaker, remote history of embolic stroke, hypertensive heart disease with left ventricular hypertrophy, s/p pericardial window for a very large pericardial effusion December 2016.   Continues her slow recovery. She denies syncope, but often feels unsteady on her feet and feels that her legs are weak, possible deconditioning from her lengthy recovery. Has difficulty remembering to use her walker (or is this just her expression of independence). She has not had any falls, injuries or bleeding problems. She does not have any new neurological complaints.   Her leg edema waxes and wanes. For the most part she is taking metolazone 2.5 mg on Tuesdays and Saturdays. She is consistently wearing her compression stockings. Her older sister, who recently passed away, had similar problems with severe leg edema as well as permanent atrial fibrillation.  Pacemaker check today shows normal device function. Lead parameters are normal. She has 53% paced rhythm and 47% ventricular sensed rhythm in permanent atrial fibrillation. There have been no episodes of high ventricular rate. The heart rate histogram appears favorable. Estimated generator longevity is 7.5 years ([employer]onic [place] implanted 2015)  Past Medical History:  Diagnosis Date  . Anemia   . Arthritis   . Chronic atrial fibrillation (HCC)    a. on Coumadin, Digoxin, and Verapamil  . Chronic diastolic  (congestive) heart failure ([place])    a. Echo 01/2015 with EF of 55-60%.  . History of GI bleed    a. secondary to AVM's. Treated with Fe infusion  . History of shingles   . HTN (hypertension)   . LVH (left ventricular hypertrophy)   . Obesity   . Pericardial effusion    a. s/p pericardial window 01/16/15  . Positive TB test   . Sleep apnea    mild-no cpap  . Stroke [facility])    a. 2013: right frontal  . Tachycardia-bradycardia syndrome [facility])    a. s/p Med[employer]onic [place], model number [id number], serial number [id number] 12/2013    Past Surgical History:  Procedure Laterality Date  . BOWEL RESECTION N/A 05/31/2015   Procedure: SMALL BOWEL RESECTION;  Surgeon: Donnie Mesa, MD;  Location: [place];  Service: General;  Laterality: N/A;  . BREAST LUMPECTOMY Bilateral    negative for cancer  . CATARACT EXTRACTION W/ INTRAOCULAR LENS  IMPLANT, BILATERAL Bilateral   . CHOLECYSTECTOMY  2005  . COLON RESECTION N/A 05/31/2015   Procedure: SIGMOID COLECTOMY;  Surgeon: Donnie Mesa, MD;  Location: [place];  Service: General;  Laterality: N/A;  . COLONOSCOPY  08/11/2011   Procedure: COLONOSCOPY;  Surgeon: Jeryl Columbia, MD;  Location: [facility] ENDOSCOPY;  Service: Endoscopy;  Laterality: N/A;  . COLOSTOMY N/A 05/31/2015   Procedure: DESCENDING COLOSTOMY;  Surgeon: Donnie Mesa, MD;  Location: [place];  Service: General;  Laterality: N/A;  . DEBRIDEMENT OF ABDOMINAL WALL ABSCESS N/A 05/31/2015   Procedure: DRAINAGE OF PELVIC ABSCESS;  Surgeon: Donnie Mesa,  MD;  Location: [place];  Service: General;  Laterality: N/A;  . ESOPHAGOGASTRODUODENOSCOPY  05/19/2011   Procedure: ESOPHAGOGASTRODUODENOSCOPY (EGD);  Surgeon: Lear Ng, MD;  Location: [facility] ENDOSCOPY;  Service: Endoscopy;  Laterality: N/A;  doctor aware of inr   will try to be here no later than 230  . ESOPHAGOGASTRODUODENOSCOPY  06/22/2011   Procedure: ESOPHAGOGASTRODUODENOSCOPY (EGD);  Surgeon: Arta Silence, MD;  Location: [facility] ENDOSCOPY;  Service: Endoscopy;   Laterality: N/A;  Check PT/INR in am  . GIVENS CAPSULE STUDY  06/23/2011   Procedure: GIVENS CAPSULE STUDY;  Surgeon: Arta Silence, MD;  Location: [facility] ENDOSCOPY;  Service: Endoscopy;  Laterality: N/A;  . HOT HEMOSTASIS  08/11/2011   Procedure: HOT HEMOSTASIS (ARGON PLASMA COAGULATION/BICAP);  Surgeon: Jeryl Columbia, MD;  Location: [facility];  Service: Endoscopy;  Laterality: N/A;  . INSERT / REPLACE / REMOVE PACEMAKER  2001   Last generator in 2006; interrogated Dec 2012  . LEAD REVISION N/A 01/02/2014   Procedure: LEAD REVISION;  Surgeon: Sanda Klein, MD;  Location: [place] CATH LAB;  Service: Cardiovascular;  Laterality: N/A;  . LIPOMA EXCISION Left 07/2013   wrist  . MASS EXCISION Left 09/14/2013   Procedure: EXCISION MASS LEFT WRIST;  Surgeon: Leanora Cover, MD;  Location: [place];  Service: Orthopedics;  Laterality: Left;  . PACEMAKER GENERATOR CHANGE N/A 01/02/2014   Procedure: PACEMAKER GENERATOR CHANGE;  Surgeon: Sanda Klein, MD;  Location: [place] CATH LAB;  Service: Cardiovascular;  Laterality: N/A;  . SUBXYPHOID PERICARDIAL WINDOW N/A 01/16/2015   Procedure: SUBXYPHOID PERICARDIAL WINDOW;  Surgeon: Grace Isaac, MD;  Location: [place];  Service: Thoracic;  Laterality: N/A;  . TEE WITHOUT CARDIOVERSION N/A 01/16/2015   Procedure: TRANSESOPHAGEAL ECHOCARDIOGRAM (TEE);  Surgeon: Grace Isaac, MD;  Location: [place];  Service: Thoracic;  Laterality: N/A;  . [place]    Outpatient Prescriptions Prior to Visit  Medication Sig Dispense Refill  . ciprofloxacin (CIPRO) 500 MG tablet Take 1 tablet (500 mg total) by mouth 2 (two) times daily. 27 tablet 0  . Digoxin 62.5 MCG TABS Take 0.0625 mg by mouth daily, 5 days a week, skip Wednesday and Sunday  30 tablet 6  . metolazone (ZAROXOLYN) 2.5 MG tablet TAKE 1 TABLET 30 MINUTES PRIOR TO TORSEMIDE DOSE ON Wednesday AND Saturday. 30 tablet 3  . ondansetron (ZOFRAN ODT) 4 MG disintegrating tablet Take  1 tablet (4 mg total) by mouth every 8 (eight) hours as needed for nausea or vomiting. 20 tablet 0  . polyethylene glycol (MIRALAX / GLYCOLAX) packet Take 17 g by mouth daily.    . potassium chloride (K-DUR,KLOR-CON) 10 MEQ tablet Take 20 mEq by mouth daily. Take with Torsemide    . torsemide (DEMADEX) 20 MG tablet Take 3 tablets (60 mg total) by mouth daily. 180 tablet 6  . verapamil (CALAN-SR) 180 MG CR tablet TAKE 1 TABLET(S) BY MOUTH EVERY NIGHT AT BEDTIME 30 tablet 6  . warfarin (COUMADIN) 5 MG tablet Take 1 tablet by mouth daily or as directed by coumadin clinic (Patient taking differently: 2.5-5 mg. Monday Wednesday Friday Saturday (2.5 mg ) 5 mg all other days) 90 tablet 1  . metroNIDAZOLE (FLAGYL) 500 MG tablet Take 1 tablet (500 mg total) by mouth 2 (two) times daily. 27 tablet 0   No facility-administered medications prior to visit.     Allergies:   Anti-inflammatory enzyme [nutritional supplements]; Iodinated diagnostic agents; Spironolactone; Arthrotec [diclofenac-misoprostol]; Biaxin [clarithromycin]; and Plavix [clopidogrel bisulfate]  Social History   Social History  . Marital status: Widowed    Spouse name: N/A  . Number of children: N/A  . Years of education: N/A   Social History Main Topics  . Smoking status: Never Smoker  . Smokeless tobacco: Never Used  . Alcohol use 0.0 oz/week     Comment: 01/02/2014 "I'll have a drink a few times/year"  . Drug use: No  . Sexual activity: No   Other Topics Concern  . None   Social History Narrative  . None     Family History:  The patient's family history includes Colon cancer in her daughter and sister; Pneumonia in her father; Stroke in her father and mother.   ROS:   Please see the history of present illness.    ROS All other systems reviewed and are negative.   PHYSICAL EXAM:   VS:  BP 102/61 (BP Location: Left Arm, Patient Position: Sitting, Cuff Size: Normal)   Pulse 75   Ht 5' 3.75" (1.619 m)   Wt 149 lb  6.4 oz (67.8 kg)   BMI 25.85 kg/m    GEN: Well nourished, well developed, in no acute distress  HEENT: normal  Neck: no JVD, carotid bruits, or masses Cardiac: irregular; no murmurs, rubs, or gallops, 2+ symmetrical pretibial edema ; healthy right subclavian device site Respiratory:  clear to auscultation bilaterally, normal work of breathing GI: soft, nontender, nondistended, + BS MS: no deformity or atrophy  Skin: warm and dry, no rash Neuro:  Alert and Oriented x 3, Strength and sensation are intact Psych: euthymic mood, full affect  Wt Readings from Last 3 Encounters:  09/02/15 149 lb 6.4 oz (67.8 kg)  08/28/15 153 lb 3.2 oz (69.5 kg)  07/11/15 144 lb 3.2 oz (65.4 kg)      Studies/Labs Reviewed:   EKG:  EKG is ordered today.  The ekg ordered today demonstrates 100% ventricular paced rhythm with a background of atrial fibrillation. The QTC is 533 ms  Recent Labs: 01/15/2015: TSH 1.989 06/26/2015: Magnesium 1.8 07/11/2015: ALT 39; Hemoglobin 9.1; Platelets 408 07/29/2015: Brain Natriuretic Peptide 426.4; BUN 21; Creat 0.99; Potassium 3.6; Sodium 144   Lipid Panel    Component Value Date/Time   CHOL 160 08/03/2013 1155   TRIG 147 06/10/2015 0500   HDL 37 (L) 08/03/2013 1155   CHOLHDL 4.3 08/03/2013 1155   VLDL 23 08/03/2013 1155   LDLCALC 100 (H) 08/03/2013 1155      ASSESSMENT:    1. Chronic atrial fibrillation ([place])   2. Chronic diastolic congestive heart failure ([place])   3. Pacemaker   4. History of CVA (cerebrovascular accident)   5. Chronic anticoagulation - Coumadin, CHADS2VASC=7   6. Essential hypertension, benign   7. Pericardial effusion, Large      PLAN:  In order of problems listed above:  1. AFib: Reasonable balance between rate control and trying to minimize ventricular pacing. Consider stopping the digoxin altogether in the future. For now seems to be doing a good job. Her grades are a little faster and ventricular pacing was occurring last night  that she is more physically active. Her blood pressure does not really allow use of higher doses of conventional AV blocking agent such as beta blockers or verapamil. 2. CHF: Okay to take a third weekly dose of metolazone on Thursdays if swelling is worse than usual 3. PPM: normal device function 4. CVA: High risk of recurrent embolic events, interruption of warfarin should be done with "bridging".  CHADSVasc 7 (age 31, stroke 2, gender, HTN, CHF) 5. Warfarin anticoagulation, without bleeding complications 6. HTN: Good blood pressure control 7. S/p her pericardial window December 2016, trivial residual effusion    Medication Adjustments/Labs and Tests Ordered: Current medicines are reviewed at length with the patient today.  Concerns regarding medicines are outlined above.  Medication changes, Labs and Tests ordered today are listed in the Patient Instructions below. Patient Instructions  Medication Instructions: Dr Sallyanne Kuster has recommended making the following medication changes: 1. TAKE Metolazone 2.5 mg - OK to take 3rd dose on Thursdays if weight is greater than 145 pounds  Labwork: Your physician recommends that you return for lab work TODAY.  Testing/Procedures: NONE ORDERED  Follow-up: Dr C recommends that you schedule a follow-up appointment in 3 months.  If you need a refill on your cardiac medications before your next appointment, please call your pharmacy.`      Signed, Sanda Klein, MD  09/02/2015 4:08 PM    [facility] [address], [place], [place]  [address] Phone: [phone]; Fax: [phone]

## 2015-09-02 NOTE — Patient Instructions (Signed)
Medication Instructions: Dr Sallyanne Kuster has recommended making the following medication changes: 1. TAKE Metolazone 2.5 mg - OK to take 3rd dose on Thursdays if weight is greater than 145 pounds  Labwork: Your physician recommends that you return for lab work TODAY.  Testing/Procedures: NONE ORDERED  Follow-up: Dr C recommends that you schedule a follow-up appointment in 3 months.  If you need a refill on your cardiac medications before your next appointment, please call your pharmacy.`

## 2015-09-03 ENCOUNTER — Telehealth: Payer: Self-pay

## 2015-09-03 DIAGNOSIS — E876 Hypokalemia: Secondary | ICD-10-CM

## 2015-09-03 LAB — POCT INR: INR: 2.2

## 2015-09-03 NOTE — Telephone Encounter (Signed)
-----   Message from Sanda Klein, MD sent at 09/03/2015 10:39 AM EDT ----- Potassium is low. Please increase potassium supplement to 20 mEq 3 times a day for 3 days, then have her take 20 mEq twice daily and recheck labs in 2 weeks.

## 2015-09-03 NOTE — Telephone Encounter (Signed)
Called patient with results. Patient verbalized understanding. Patient gave me permission to discuss results with caregiver as well. Caregiver verbalized understanding and agreed with plan. Order placed for repeat labs in 2 weeks.

## 2015-09-04 ENCOUNTER — Telehealth: Payer: Self-pay | Admitting: Cardiovascular Disease

## 2015-09-04 ENCOUNTER — Ambulatory Visit (INDEPENDENT_AMBULATORY_CARE_PROVIDER_SITE_OTHER): Payer: Medicare Other | Admitting: Pharmacist

## 2015-09-04 DIAGNOSIS — I6310 Cerebral infarction due to embolism of unspecified precerebral artery: Secondary | ICD-10-CM

## 2015-09-04 DIAGNOSIS — Z7901 Long term (current) use of anticoagulants: Secondary | ICD-10-CM

## 2015-09-04 NOTE — Telephone Encounter (Signed)
pt felt bad last night, couldn't really elaborate on her symptoms, other than stating "I felt like I couldn't hardly go".  Initially denied SOB, just felt very fatigued, but then states she couldn't be sure if she wasn't having more trouble catching her breath Denies syncope, lightheadedness, chest pain. O2 level in 90s when checked on pulse oximetry. States also that her pulse was in 30s and stayed this way for several minutes when checked. She became very concerned about this and knows her PM is set for min 60 bpm. Aware I will check w Dr. Sallyanne Kuster and device clinic for recommendations. - unsure if she can be checked from home, but she had recent PM interrogation which was apparently fine.

## 2015-09-04 NOTE — Telephone Encounter (Signed)
Called pt back and informed her that pulse oximetry readings are not always correct when a pt has an irregular heart rhythm such as a-fib. Assured pt that device would not let her heart rate go below 60bpm. Pt asked then why do I feel bad. I told pt that I was not sure and pt promptly hung up the phone.

## 2015-09-04 NOTE — Telephone Encounter (Signed)
Suspect she was having frequent PVCs and this can indeed confuse the pulse oximeter and also make her feel unwell. Pacemaker was working well just the other day.

## 2015-09-04 NOTE — Telephone Encounter (Signed)
Spoke w patient and relayed advice given by Dr. Sallyanne Kuster. Pt voiced understanding and thanks.

## 2015-09-04 NOTE — Telephone Encounter (Signed)
New message      Pt states her pacemaker is set on 60.  Last night she was feeling really bad and her heart rate went down to 37.  Now is is fine but should pt be concerned?

## 2015-09-05 ENCOUNTER — Encounter: Payer: Self-pay | Admitting: Cardiovascular Disease

## 2015-09-05 LAB — CUP PACEART INCLINIC DEVICE CHECK
Battery Impedance: 308 Ohm
Battery Voltage: 2.78 V
Brady Statistic RV Percent Paced: 53 %
Lead Channel Impedance Value: 0 Ohm
Lead Channel Impedance Value: 438 Ohm
Lead Channel Sensing Intrinsic Amplitude: 11.2 mV
Lead Channel Setting Pacing Amplitude: 2.5 V
Lead Channel Setting Pacing Pulse Width: 0.4 ms
Lead Channel Setting Sensing Sensitivity: 5.6 mV
MDC IDC LEAD IMPLANT DT: 20151208
MDC IDC LEAD LOCATION: 753860
MDC IDC MSMT BATTERY REMAINING LONGEVITY: 93 mo
MDC IDC MSMT LEADCHNL RV PACING THRESHOLD AMPLITUDE: 0.875 V
MDC IDC MSMT LEADCHNL RV PACING THRESHOLD PULSEWIDTH: 0.4 ms
MDC IDC SESS DTM: 20170807110542

## 2015-09-09 ENCOUNTER — Encounter: Payer: Self-pay | Admitting: Cardiovascular Disease

## 2015-09-09 ENCOUNTER — Other Ambulatory Visit: Payer: Self-pay | Admitting: Physician Assistant

## 2015-09-09 ENCOUNTER — Telehealth: Payer: Self-pay | Admitting: Cardiovascular Disease

## 2015-09-09 ENCOUNTER — Telehealth: Payer: Self-pay

## 2015-09-09 NOTE — Telephone Encounter (Signed)
Do you want her to have this.  I've looked back and it does not look like you have been treating her for chronic pain in that past. I will refill if you'd like.

## 2015-09-09 NOTE — Telephone Encounter (Signed)
No DPR on file for [name] - left msg for patient to call.

## 2015-09-09 NOTE — Telephone Encounter (Signed)
Please call asap today,he needs to ask some very important questions.

## 2015-09-09 NOTE — Telephone Encounter (Signed)
Dr. Everlene Farrier is on vacation, would someone be willing to do this for him?

## 2015-09-09 NOTE — Telephone Encounter (Signed)
Patient  Needs her oxyCODONE (ROXICODONE) 5 MG immediate release tablet refilled.  Her call back number is [phone]

## 2015-09-10 ENCOUNTER — Other Ambulatory Visit: Payer: Self-pay | Admitting: Physician Assistant

## 2015-09-10 LAB — POCT INR: INR: 2

## 2015-09-10 MED ORDER — OXYCODONE HCL 5 MG PO TABS: ORAL_TABLET | ORAL | 0 refills | Status: DC

## 2015-09-10 NOTE — Telephone Encounter (Signed)
I have completed this and the front staff will be calling the patient's mother for pickup. Philis Fendt, MS, PA-C 1:26 PM, 09/10/2015

## 2015-09-10 NOTE — Progress Notes (Signed)
Refilling at the request of Dr. Everlene Farrier.  Philis Fendt, MS, PA-C 1:11 PM, 09/10/2015

## 2015-09-10 NOTE — Telephone Encounter (Signed)
It is okay to do this. She is on this for chronic back pain that started with her recent multiple stays in the hospital. She takes a half tablet usually once a day.

## 2015-09-10 NOTE — Telephone Encounter (Signed)
Pt's daughter is calling to check status of Rx. I advised that Dr Sherlyn Lees this and a PA should be able to write the Rx today. We will call when ready.

## 2015-09-10 NOTE — Telephone Encounter (Signed)
Pt's daughter would like [place] to call her when ready at [phone] so she can p/up.

## 2015-09-11 ENCOUNTER — Telehealth: Payer: Self-pay

## 2015-09-11 NOTE — Telephone Encounter (Signed)
Left message for rick to call.

## 2015-09-11 NOTE — Telephone Encounter (Signed)
[place] faxed order for ostomy supplies. Completed form and placed in Dr Perfecto Kingdom box for signature.

## 2015-09-11 NOTE — Telephone Encounter (Signed)
Spoke with pt, she reports that rick is her nephew, he was calling about the swelling in her feet and legs. She reports since the addition of the metolazone the swelling has gotten some better. Her weight is stable and the SOB she has is her normal. She thinks he wants to know if the electronic SCD's would help with her swelling. She also reports having a lot of back pain and he is wanting to know if the muscle stimulator can be used with her pacemaker. The pt give me permission to talk to her nephew rick.

## 2015-09-11 NOTE — Telephone Encounter (Signed)
Mariana Arn calling about using TENS unit with Ms. Farino's PPM. Ms. Dutta is not dependent on her PPM, the TENS unit will be used on her mid-back. I advised him that if she used the TENS unit to remain lying down and discontinue use if she become dizzy or lightheaded. I have advised him to contact [employer] tech services for definitive answers regarding questions about interaction from TENS causing PPM malfunction. He got very frustrated that he would have to make another call for answers to his questions, I apologized and explained that for specific information regarding Ms. Spurr's device and interference with other equipment, engineers at [employer] would be best suited to ask.

## 2015-09-11 NOTE — Telephone Encounter (Signed)
Spoke with rick, aware the pt can use the SCD device per dr c but that it is usually used in an inpatient environment and medicare will not pay for them. Also made him aware discussing the TENS use with [employer] is what dr c recommended. He reports he spoke with[employer] and they do not recommend using those. Referred them to medical doctor for back pain.

## 2015-09-11 NOTE — Telephone Encounter (Signed)
F/u      Pts husbands states he is returning the doctors call. Please call.

## 2015-09-12 ENCOUNTER — Ambulatory Visit (INDEPENDENT_AMBULATORY_CARE_PROVIDER_SITE_OTHER): Payer: Medicare Other | Admitting: Pharmacist

## 2015-09-12 DIAGNOSIS — I6310 Cerebral infarction due to embolism of unspecified precerebral artery: Secondary | ICD-10-CM

## 2015-09-12 DIAGNOSIS — Z7901 Long term (current) use of anticoagulants: Secondary | ICD-10-CM

## 2015-09-16 ENCOUNTER — Other Ambulatory Visit: Payer: Self-pay | Admitting: Physician Assistant

## 2015-09-16 ENCOUNTER — Telehealth: Payer: Self-pay

## 2015-09-16 MED ORDER — OXYCODONE HCL 5 MG PO TABS: ORAL_TABLET | ORAL | 0 refills | Status: DC

## 2015-09-16 NOTE — Telephone Encounter (Signed)
Patient's daughter is calling to request a refill for oxycodone. Please call when ready! [phone]

## 2015-09-17 NOTE — Telephone Encounter (Signed)
Patients daughter called last night wanting to know why this RX had not been filled yet, after looking through the chart and checking the controlled substance log this medication had been printed and picked up on 09/10/15 by a Veverly Fells. Any question please see Urban Gibson about this. Thank you

## 2015-09-17 NOTE — Telephone Encounter (Signed)
Checked w/Caitlin who reported that she did advise daughter that the Rx has already been pick up. Daughter stated that the 1/2 tab is not enough for pt and Urban Gibson advised her that pt will need to come in to be seen for any changes to be considered on medication. Daughter agreed that pt will come in tomorrow.

## 2015-09-18 ENCOUNTER — Ambulatory Visit (INDEPENDENT_AMBULATORY_CARE_PROVIDER_SITE_OTHER): Payer: Medicare Other | Admitting: Emergency Medicine

## 2015-09-18 ENCOUNTER — Telehealth: Payer: Self-pay | Admitting: Cardiovascular Disease

## 2015-09-18 ENCOUNTER — Encounter: Payer: Self-pay | Admitting: Emergency Medicine

## 2015-09-18 VITALS — BP 110/58 | HR 85 | Temp 97.7°F | Resp 12 | Ht 63.75 in | Wt 146.0 lb

## 2015-09-18 DIAGNOSIS — I6310 Cerebral infarction due to embolism of unspecified precerebral artery: Secondary | ICD-10-CM

## 2015-09-18 DIAGNOSIS — G8929 Other chronic pain: Secondary | ICD-10-CM

## 2015-09-18 DIAGNOSIS — M546 Pain in thoracic spine: Secondary | ICD-10-CM

## 2015-09-18 DIAGNOSIS — N289 Disorder of kidney and ureter, unspecified: Secondary | ICD-10-CM

## 2015-09-18 DIAGNOSIS — K5732 Diverticulitis of large intestine without perforation or abscess without bleeding: Secondary | ICD-10-CM

## 2015-09-18 DIAGNOSIS — M542 Cervicalgia: Secondary | ICD-10-CM

## 2015-09-18 DIAGNOSIS — I482 Chronic atrial fibrillation, unspecified: Secondary | ICD-10-CM

## 2015-09-18 DIAGNOSIS — R1030 Lower abdominal pain, unspecified: Secondary | ICD-10-CM | POA: Diagnosis not present

## 2015-09-18 DIAGNOSIS — D500 Iron deficiency anemia secondary to blood loss (chronic): Secondary | ICD-10-CM | POA: Diagnosis not present

## 2015-09-18 LAB — POCT CBC
Granulocyte percent: 47.6 %G (ref 37–80)
HEMATOCRIT: 28 % — AB (ref 37.7–47.9)
Hemoglobin: 9.2 g/dL — AB (ref 12.2–16.2)
LYMPH, POC: 1.9 (ref 0.6–3.4)
MCH, POC: 23.2 pg — AB (ref 27–31.2)
MCHC: 33 g/dL (ref 31.8–35.4)
MCV: 70.5 fL — AB (ref 80–97)
MID (CBC): 0.3 (ref 0–0.9)
MPV: 7.2 fL (ref 0–99.8)
POC GRANULOCYTE: 2 (ref 2–6.9)
POC LYMPH %: 44.3 % (ref 10–50)
POC MID %: 8.1 % (ref 0–12)
Platelet Count, POC: 272 10*3/uL (ref 142–424)
RBC: 3.98 M/uL — AB (ref 4.04–5.48)
RDW, POC: 15.7 %
WBC: 4.2 10*3/uL — AB (ref 4.6–10.2)

## 2015-09-18 LAB — BASIC METABOLIC PANEL WITH GFR
BUN: 38 mg/dL — AB (ref 7–25)
CHLORIDE: 90 mmol/L — AB (ref 98–110)
CO2: 38 mmol/L — AB (ref 20–31)
CREATININE: 1.35 mg/dL — AB (ref 0.60–0.88)
Calcium: 9.4 mg/dL (ref 8.6–10.4)
GFR, Est African American: 40 mL/min — ABNORMAL LOW (ref >=60)
GFR, Est Non African American: 35 mL/min — ABNORMAL LOW (ref >=60)
Glucose, Bld: 115 mg/dL — ABNORMAL HIGH (ref 65–99)
Potassium: 2.8 mmol/L — ABNORMAL LOW (ref 3.5–5.3)
Sodium: 138 mmol/L (ref 135–146)

## 2015-09-18 LAB — MAGNESIUM: Magnesium: 2.2 mg/dL (ref 1.5–2.5)

## 2015-09-18 MED ORDER — OXYCODONE HCL 5 MG PO TABS: ORAL_TABLET | ORAL | 0 refills | Status: DC

## 2015-09-18 NOTE — Patient Instructions (Signed)
     IF you received an x-ray today, you will receive an invoice from [facility]. Please contact [facility] at [phone] with questions or concerns regarding your invoice.   IF you received labwork today, you will receive an invoice from [facility]. Please contact [facility] at [phone] with questions or concerns regarding your invoice.   Our billing staff will not be able to assist you with questions regarding bills from these companies.  You will be contacted with the lab results as soon as they are available. The fastest way to get your results is to activate your My Chart account. Instructions are located on the last page of this paperwork. If you have not heard from us regarding the results in 2 weeks, please contact this office.      

## 2015-09-18 NOTE — Telephone Encounter (Signed)
New Message  Pt son call requesting to speak with RN about pt pacer. Son wants to know the making serial Number and the pacemaker help line number. Please call back to discuss

## 2015-09-18 NOTE — Progress Notes (Signed)
By signing my name below, I, [name], attest that this documentation has been prepared under the direction and in the presence of [name], MD. Electronically Signed: [name], [place]. 09/18/2015 , 10:57 AM .  Patient was seen in room 5 .  Chief Complaint:  Chief Complaint  Patient presents with  . Medication Refill    oxycodone  . Back Pain  . Other    Oxygen level initially low, only reached 97% with ambulation    HPI: [name] is a 82 y.o. female who reports to [facility] today complaining of back pain, as well as medication refill of her oxycodone.   Patient states her back pain, which is located between left shoulder blade and back, is still throbbing. She had an xray of her thoracic spine 3 weeks ago without any acute findings. She had an injection 3~4 years ago done by Dr. Elnita Maxwell. She's been taking 1/2 tablet of oxycodone every 6 hours but she didn't have any relief. She also notes some constipation and has been taking miralax.   She plans to see her urologist tomorrow. She denies dysuria or hematuria.   Past Medical History:  Diagnosis Date  . Anemia   . Arthritis   . Chronic atrial fibrillation (HCC)    a. on Coumadin, Digoxin, and Verapamil  . Chronic diastolic (congestive) heart failure ([place])    a. Echo 01/2015 with EF of 55-60%.  . History of GI bleed    a. secondary to AVM's. Treated with Fe infusion  . History of shingles   . HTN (hypertension)   . LVH (left ventricular hypertrophy)   . Obesity   . Pericardial effusion    a. s/p pericardial window 01/16/15  . Positive TB test   . Sleep apnea    mild-no cpap  . Stroke [facility])    a. 2013: right frontal  . Tachycardia-bradycardia syndrome [facility])    a. s/p Medtronic Ciales, model number [id number], serial number [id number] H 12/2013   Past Surgical History:  Procedure Laterality Date  . BOWEL RESECTION N/A 05/31/2015   Procedure: SMALL BOWEL RESECTION;  Surgeon: Donnie Mesa, MD;  Location: [place];  Service: General;  Laterality: N/A;  . BREAST LUMPECTOMY Bilateral    negative for cancer  . CATARACT EXTRACTION W/ INTRAOCULAR LENS  IMPLANT, BILATERAL Bilateral   . CHOLECYSTECTOMY  2005  . COLON RESECTION N/A 05/31/2015   Procedure: SIGMOID COLECTOMY;  Surgeon: Donnie Mesa, MD;  Location: [place];  Service: General;  Laterality: N/A;  . COLONOSCOPY  08/11/2011   Procedure: COLONOSCOPY;  Surgeon: Jeryl Columbia, MD;  Location: [facility] ENDOSCOPY;  Service: Endoscopy;  Laterality: N/A;  . COLOSTOMY N/A 05/31/2015   Procedure: DESCENDING COLOSTOMY;  Surgeon: Donnie Mesa, MD;  Location: [place];  Service: General;  Laterality: N/A;  . DEBRIDEMENT OF ABDOMINAL WALL ABSCESS N/A 05/31/2015   Procedure: DRAINAGE OF PELVIC ABSCESS;  Surgeon: Donnie Mesa, MD;  Location: [place];  Service: General;  Laterality: N/A;  . ESOPHAGOGASTRODUODENOSCOPY  05/19/2011   Procedure: ESOPHAGOGASTRODUODENOSCOPY (EGD);  Surgeon: Lear Ng, MD;  Location: [facility] ENDOSCOPY;  Service: Endoscopy;  Laterality: N/A;  doctor aware of inr   will try to be here no later than 230  . ESOPHAGOGASTRODUODENOSCOPY  06/22/2011   Procedure: ESOPHAGOGASTRODUODENOSCOPY (EGD);  Surgeon: Arta Silence, MD;  Location: [facility] ENDOSCOPY;  Service: Endoscopy;  Laterality: N/A;  Check PT/INR in am  . GIVENS CAPSULE STUDY  06/23/2011   Procedure: GIVENS CAPSULE STUDY;  Surgeon: Arta Silence, MD;  Location: [facility] ENDOSCOPY;  Service: Endoscopy;  Laterality: N/A;  . HOT HEMOSTASIS  08/11/2011   Procedure: HOT HEMOSTASIS (ARGON PLASMA COAGULATION/BICAP);  Surgeon: Jeryl Columbia, MD;  Location: [facility] ENDOS[facility];  Service: Endoscopy;  Laterality: N/A;  . INSERT / REPLACE / REMOVE PACEMAKER  2001   Last generator in 2006; interrogated Dec 2012  . LEAD REVISION N/A 01/02/2014   Procedure: LEAD REVISION;  Surgeon: Sanda Klein, MD;  Location: [place] CATH LAB;  Service: Cardiovascular;  Laterality: N/A;  . LIPOMA EXCISION Left 07/2013   wrist  . MASS EXCISION Left  09/14/2013   Procedure: EXCISION MASS LEFT WRIST;  Surgeon: Leanora Cover, MD;  Location: [place];  Service: Orthopedics;  Laterality: Left;  . PACEMAKER GENERATOR CHANGE N/A 01/02/2014   Procedure: PACEMAKER GENERATOR CHANGE;  Surgeon: Sanda Klein, MD;  Location: [place] CATH LAB;  Service: Cardiovascular;  Laterality: N/A;  . SUBXYPHOID PERICARDIAL WINDOW N/A 01/16/2015   Procedure: SUBXYPHOID PERICARDIAL WINDOW;  Surgeon: Grace Isaac, MD;  Location: [place];  Service: Thoracic;  Laterality: N/A;  . TEE WITHOUT CARDIOVERSION N/A 01/16/2015   Procedure: TRANSESOPHAGEAL ECHOCARDIOGRAM (TEE);  Surgeon: Grace Isaac, MD;  Location: [place];  Service: Thoracic;  Laterality: N/A;  . TONSILLECTOMY AND ADENOIDECTOMY  1950   Social History   Social History  . Marital status: Widowed    Spouse name: N/A  . Number of children: N/A  . Years of education: N/A   Social History Main Topics  . Smoking status: Never Smoker  . Smokeless tobacco: Never Used  . Alcohol use 0.0 oz/week     Comment: 01/02/2014 "I'll have a drink a few times/year"  . Drug use: No  . Sexual activity: No   Other Topics Concern  . None   Social History Narrative  . None   Family History  Problem Relation Age of Onset  . Stroke Mother   . Stroke Father   . Pneumonia Father   . Colon cancer Sister   . Colon cancer Daughter    Allergies  Allergen Reactions  . Anti-Inflammatory Enzyme [Nutritional Supplements] Other (See Comments)    Retains fluids and headaches  . Iodinated Diagnostic Agents Hives    HIVES 15MIN S/P IV CONTRAST INJECTION,WILL NEED 13 HR PREP FOR FUTURE INJECTIONS, ok s/p 50mg  po benadryl//a.[place]  . Spironolactone Other (See Comments)    Hair loss  . Arthrotec [Diclofenac-Misoprostol] Other (See Comments)    unknown  . Biaxin [Clarithromycin] Other (See Comments)    unknown  . Plavix [Clopidogrel Bisulfate] Other (See Comments)    unknown   Prior to Admission  medications   Medication Sig Start Date End Date Taking? Authorizing Provider  DIGITEK 125 MCG tablet Take 0.5 tablets by mouth daily. 06/26/15   Historical Provider, MD  magnesium gluconate (MAGONATE) 500 MG tablet Take 1 tablet (500 mg total) by mouth daily. 06/26/15   Lavon Paganini Angiulli, PA-C  metolazone (ZAROXOLYN) 2.5 MG tablet Take 1 tablet (2.5 mg total) by mouth 2 (two) times a week. 09/02/15 10/26/16  Mihai Croitoru, MD  oxyCODONE (ROXICODONE) 5 MG immediate release tablet One half tablet every 12 hours for severe pain. 09/16/15   [name], PA-C  potassium chloride (K-DUR) 10 MEQ tablet Take 2 tablets (20 mEq total) by mouth daily. With torsemide 08/12/15   Mihai Croitoru, MD  saccharomyces boulardii (FLORASTOR) 250 MG capsule Take 1 capsule (250 mg total) by mouth 2 (two) times daily. 06/26/15  Lavon Paganini Angiulli, PA-C  torsemide (DEMADEX) 20 MG tablet Take 3 tablets by mouth daily. 08/19/15   Historical Provider, MD  verapamil (CALAN-SR) 120 MG CR tablet Take 1 tablet (120 mg total) by mouth daily. 06/26/15   Lavon Paganini Angiulli, PA-C  warfarin (COUMADIN) 5 MG tablet Take 1 tablet (5 mg total) by mouth daily at 6 PM. 06/26/15   Lavon Paganini Angiulli, PA-C     ROS:  Constitutional: negative for fever, chills, night sweats, weight changes, or fatigue  HEENT: negative for vision changes, hearing loss, congestion, rhinorrhea, ST, epistaxis, or sinus pressure Cardiovascular: negative for chest pain or palpitations Respiratory: negative for hemoptysis, wheezing, shortness of breath, or cough Abdominal: negative for abdominal pain, nausea, vomiting, diarrhea; positive for constipation Dermatological: negative for rash Musc: positive for back pain Neurologic: negative for headache, dizziness, or syncope All other systems reviewed and are otherwise negative with the exception to those above and in the HPI.  PHYSICAL EXAM: Vitals:   09/18/15 1032  BP: (!) 110/58  Pulse: 85  Resp: 12  Temp: 97.7  F (36.5 C)   Body mass index is 25.26 kg/m.   General: Elderly, alert and cooperative HEENT:  Normocephalic, atraumatic, oropharynx patent. Eye: Juliette Mangle [facility] Cardiovascular: S2 slightly irregular, pacemaker present, no rubs or gallops.  No Carotid bruits, radial pulse intact. No pedal edema.  Respiratory: Clear to auscultation bilaterally.  No wheezes, rales, or rhonchi.  No cyanosis, no use of accessory musculature Abdominal: colostomy, no tenderness Musculoskeletal: tenderness along mid-thoracic area Skin: No rashes. Neurologic: Facial musculature symmetric. Psychiatric: Patient acts appropriately throughout our interaction.  Lymphatic: No cervical or submandibular lymphadenopathy Genitourinary/Anorectal: No acute findings  LABS: Results for orders placed or performed in visit on 09/18/15  POCT CBC  Result Value Ref Range   WBC 4.2 (A) 4.6 - 10.2 K/uL   Lymph, poc 1.9 0.6 - 3.4   POC LYMPH PERCENT 44.3 10 - 50 %L   MID (cbc) 0.3 0 - 0.9   POC MID % 8.1 0 - 12 %M   POC Granulocyte 2.0 2 - 6.9   Granulocyte percent 47.6 37 - 80 %G   RBC 3.98 (A) 4.04 - 5.48 M/uL   Hemoglobin 9.2 (A) 12.2 - 16.2 g/dL   HCT, POC 28.0 (A) 37.7 - 47.9 %   MCV 70.5 (A) 80 - 97 fL   MCH, POC 23.2 (A) 27 - 31.2 pg   MCHC 33.0 31.8 - 35.4 g/dL   RDW, POC 15.7 %   Platelet Count, POC 272 142 - 424 K/uL   MPV 7.2 0 - 99.8 fL    EKG/XRAY:     ASSESSMENT/PLAN: She is having a horrible problem with her neck and upper back. I increased her pain medicine and she will be on oxycodone 5 mg every 8 hours. CT scan ordered of the cervical spine and thoracic spine. She understands she will need to sign a pain contract here at the office.I personally performed the services described in this documentation, which was scribed in my presence. The recorded information has been reviewed and is accurate. She is due to see the urologist tomorrow about her abnormal CT of the kidneys.  Gross sideeffects, risk and  benefits, and alternatives of medications d/w patient. Patient is aware that all medications have potential sideeffects and we are unable to predict every sideeffect or drug-drug interaction that may occur.  [name] MD 09/18/2015 11:05 AM

## 2015-09-19 ENCOUNTER — Telehealth: Payer: Self-pay

## 2015-09-19 NOTE — Telephone Encounter (Addendum)
[employer] faxed another order for ostomy supplies. I believe this is for a different supply ( ostomy deodorant) than prev order. I have completed and put in Dr Perfecto Kingdom box for signature.  Also faxed order today for drn pouches w/ext wear barrier (separate form). Will also put in Daub's box.

## 2015-09-19 NOTE — Telephone Encounter (Signed)
Spoke w/ pt son. Gave him device serial number and the number to [employer]. Informed him that he can order a ID card w/ [employer] and that card would provide all the information regarding her PPM. Pt son verbalized understanding.

## 2015-09-20 ENCOUNTER — Telehealth: Payer: Self-pay

## 2015-09-20 DIAGNOSIS — E876 Hypokalemia: Secondary | ICD-10-CM

## 2015-09-20 NOTE — Telephone Encounter (Signed)
-----   Message from Sanda Klein, MD sent at 09/19/2015  8:19 AM EDT ----- Enid Cutter, please ask Mrs. Cassy to stop both the metolazone and torsemide completely for 2 days while we replace her potassium. Increase potassium chloride 20 mEq 3 times a day. On Saturday she can restart the torsemide, but not the metolazone. Recheck basic metabolic panel on Monday please.

## 2015-09-20 NOTE — Telephone Encounter (Signed)
Notes Recorded by Dionne Bucy Truitt, CMA on 09/20/2015 at 3:35 PM EDT Returned call to Mrs [name] and her caretaker. Caretaker verbalized understanding and agreed with the following plan. plan. "Resume torsemide Sunday night and have repeat blood work on Tuesday."  Lab ordered. ------  Notes Recorded by Dionne Bucy Truitt, CMA on 09/20/2015 at 3:21 PM EDT Called patient with results. Patient gave permission to speak with her caretaker. Results reviewed with caretaker. Caretaker verbalized understanding. Patient took torsemide today.  Discussed with MCr, patient can resume torsemide Sunday night and have repeat blood work on Tuesday.

## 2015-09-23 ENCOUNTER — Telehealth: Payer: Self-pay | Admitting: Family Medicine

## 2015-09-23 NOTE — Telephone Encounter (Signed)
Called pt advised her to take the oxycodone 1 tab every 4-6 hours per Daub she also went to get thoracic Ct scanned done today at [facility] and referrals will look into her other CT of her cervical spine pt wants to go to [place] [facility]

## 2015-09-23 NOTE — Telephone Encounter (Signed)
Pt was calling to speak with Dr Everlene Farrier she is having severe back Pain and the oxycodone that she takes every 8 hours doesn't even last up to 3 hours and she want the doctor to call her as soon as possible

## 2015-09-23 NOTE — Telephone Encounter (Signed)
We will increase her pain medication to every 4-6 hours and proceed as quickly as possible to scan her back.

## 2015-09-24 ENCOUNTER — Inpatient Hospital Stay (HOSPITAL_COMMUNITY)
Admission: EM | Admit: 2015-09-24 | Discharge: 2015-09-30 | DRG: 682 | Disposition: A | Discharge status: Home Health | Payer: Medicare Other | Attending: Internal Medicine | Admitting: Internal Medicine

## 2015-09-24 ENCOUNTER — Telehealth: Payer: Self-pay | Admitting: *Deleted

## 2015-09-24 ENCOUNTER — Encounter: Payer: Self-pay | Admitting: Cardiovascular Disease

## 2015-09-24 ENCOUNTER — Emergency Department (HOSPITAL_COMMUNITY): Payer: Medicare Other

## 2015-09-24 ENCOUNTER — Encounter (HOSPITAL_COMMUNITY): Payer: Self-pay | Admitting: Emergency Medicine

## 2015-09-24 DIAGNOSIS — Z950 Presence of cardiac pacemaker: Secondary | ICD-10-CM

## 2015-09-24 DIAGNOSIS — Z91041 Radiographic dye allergy status: Secondary | ICD-10-CM

## 2015-09-24 DIAGNOSIS — G8929 Other chronic pain: Secondary | ICD-10-CM | POA: Diagnosis present

## 2015-09-24 DIAGNOSIS — E860 Dehydration: Secondary | ICD-10-CM | POA: Diagnosis present

## 2015-09-24 DIAGNOSIS — Z881 Allergy status to other antibiotic agents status: Secondary | ICD-10-CM

## 2015-09-24 DIAGNOSIS — M546 Pain in thoracic spine: Secondary | ICD-10-CM | POA: Diagnosis present

## 2015-09-24 DIAGNOSIS — Z800 Family history of malignant neoplasm of digestive organs: Secondary | ICD-10-CM

## 2015-09-24 DIAGNOSIS — I110 Hypertensive heart disease with heart failure: Secondary | ICD-10-CM | POA: Diagnosis present

## 2015-09-24 DIAGNOSIS — J690 Pneumonitis due to inhalation of food and vomit: Secondary | ICD-10-CM | POA: Diagnosis not present

## 2015-09-24 DIAGNOSIS — D649 Anemia, unspecified: Secondary | ICD-10-CM | POA: Diagnosis present

## 2015-09-24 DIAGNOSIS — I10 Essential (primary) hypertension: Secondary | ICD-10-CM | POA: Diagnosis present

## 2015-09-24 DIAGNOSIS — F419 Anxiety disorder, unspecified: Secondary | ICD-10-CM | POA: Diagnosis present

## 2015-09-24 DIAGNOSIS — I313 Pericardial effusion (noninflammatory): Secondary | ICD-10-CM | POA: Diagnosis present

## 2015-09-24 DIAGNOSIS — G4730 Sleep apnea, unspecified: Secondary | ICD-10-CM | POA: Diagnosis present

## 2015-09-24 DIAGNOSIS — Y95 Nosocomial condition: Secondary | ICD-10-CM | POA: Diagnosis present

## 2015-09-24 DIAGNOSIS — Z9841 Cataract extraction status, right eye: Secondary | ICD-10-CM | POA: Diagnosis not present

## 2015-09-24 DIAGNOSIS — I319 Disease of pericardium, unspecified: Secondary | ICD-10-CM | POA: Diagnosis not present

## 2015-09-24 DIAGNOSIS — I3139 Other pericardial effusion (noninflammatory): Secondary | ICD-10-CM | POA: Diagnosis present

## 2015-09-24 DIAGNOSIS — M549 Dorsalgia, unspecified: Secondary | ICD-10-CM | POA: Diagnosis present

## 2015-09-24 DIAGNOSIS — J9601 Acute respiratory failure with hypoxia: Secondary | ICD-10-CM | POA: Diagnosis present

## 2015-09-24 DIAGNOSIS — I482 Chronic atrial fibrillation, unspecified: Secondary | ICD-10-CM | POA: Diagnosis present

## 2015-09-24 DIAGNOSIS — Z888 Allergy status to other drugs, medicaments and biological substances status: Secondary | ICD-10-CM

## 2015-09-24 DIAGNOSIS — Z9049 Acquired absence of other specified parts of digestive tract: Secondary | ICD-10-CM | POA: Diagnosis not present

## 2015-09-24 DIAGNOSIS — N179 Acute kidney failure, unspecified: Principal | ICD-10-CM | POA: Diagnosis present

## 2015-09-24 DIAGNOSIS — R0600 Dyspnea, unspecified: Secondary | ICD-10-CM

## 2015-09-24 DIAGNOSIS — Z8619 Personal history of other infectious and parasitic diseases: Secondary | ICD-10-CM

## 2015-09-24 DIAGNOSIS — Z9842 Cataract extraction status, left eye: Secondary | ICD-10-CM

## 2015-09-24 DIAGNOSIS — Z933 Colostomy status: Secondary | ICD-10-CM

## 2015-09-24 DIAGNOSIS — K433 Parastomal hernia with obstruction, without gangrene: Secondary | ICD-10-CM | POA: Diagnosis present

## 2015-09-24 DIAGNOSIS — M5489 Other dorsalgia: Secondary | ICD-10-CM

## 2015-09-24 DIAGNOSIS — Z8673 Personal history of transient ischemic attack (TIA), and cerebral infarction without residual deficits: Secondary | ICD-10-CM | POA: Diagnosis not present

## 2015-09-24 DIAGNOSIS — K5780 Diverticulitis of intestine, part unspecified, with perforation and abscess without bleeding: Secondary | ICD-10-CM | POA: Diagnosis present

## 2015-09-24 DIAGNOSIS — Z79899 Other long term (current) drug therapy: Secondary | ICD-10-CM

## 2015-09-24 DIAGNOSIS — K5669 Other intestinal obstruction: Secondary | ICD-10-CM | POA: Diagnosis not present

## 2015-09-24 DIAGNOSIS — E440 Moderate protein-calorie malnutrition: Secondary | ICD-10-CM | POA: Diagnosis present

## 2015-09-24 DIAGNOSIS — Z961 Presence of intraocular lens: Secondary | ICD-10-CM | POA: Diagnosis present

## 2015-09-24 DIAGNOSIS — I5032 Chronic diastolic (congestive) heart failure: Secondary | ICD-10-CM | POA: Diagnosis not present

## 2015-09-24 DIAGNOSIS — E876 Hypokalemia: Secondary | ICD-10-CM | POA: Diagnosis present

## 2015-09-24 DIAGNOSIS — Z79891 Long term (current) use of opiate analgesic: Secondary | ICD-10-CM

## 2015-09-24 DIAGNOSIS — M542 Cervicalgia: Secondary | ICD-10-CM

## 2015-09-24 DIAGNOSIS — N189 Chronic kidney disease, unspecified: Secondary | ICD-10-CM | POA: Diagnosis present

## 2015-09-24 DIAGNOSIS — K56609 Unspecified intestinal obstruction, unspecified as to partial versus complete obstruction: Secondary | ICD-10-CM | POA: Diagnosis present

## 2015-09-24 DIAGNOSIS — Z823 Family history of stroke: Secondary | ICD-10-CM

## 2015-09-24 DIAGNOSIS — Z7901 Long term (current) use of anticoagulants: Secondary | ICD-10-CM

## 2015-09-24 LAB — LIPASE, BLOOD: Lipase: 53 U/L — ABNORMAL HIGH (ref 11–51)

## 2015-09-24 LAB — CBC WITH DIFFERENTIAL/PLATELET
BASOS PCT: 0 %
Basophils Absolute: 0 10*3/uL (ref 0.0–0.1)
EOS PCT: 0 %
Eosinophils Absolute: 0 10*3/uL (ref 0.0–0.7)
HEMATOCRIT: 33.5 % — AB (ref 36.0–46.0)
Hemoglobin: 10.1 g/dL — ABNORMAL LOW (ref 12.0–15.0)
Lymphocytes Relative: 8 %
Lymphs Abs: 1.3 10*3/uL (ref 0.7–4.0)
MCH: 22.3 pg — AB (ref 26.0–34.0)
MCHC: 30.1 g/dL (ref 30.0–36.0)
MCV: 74 fL — AB (ref 78.0–100.0)
MONO ABS: 1.3 10*3/uL — AB (ref 0.1–1.0)
MONOS PCT: 8 %
NEUTROS PCT: 84 %
Neutro Abs: 13.4 10*3/uL — ABNORMAL HIGH (ref 1.7–7.7)
PLATELETS: 358 10*3/uL (ref 150–400)
RBC: 4.53 MIL/uL (ref 3.87–5.11)
RDW: 15.7 % — ABNORMAL HIGH (ref 11.5–15.5)
WBC: 16 10*3/uL — AB (ref 4.0–10.5)

## 2015-09-24 LAB — COMPREHENSIVE METABOLIC PANEL
ALBUMIN: 4.2 g/dL (ref 3.5–5.0)
ALK PHOS: 102 U/L (ref 38–126)
ALT: 24 U/L (ref 14–54)
ANION GAP: 11 (ref 5–15)
AST: 34 U/L (ref 15–41)
BUN: 47 mg/dL — ABNORMAL HIGH (ref 6–20)
CALCIUM: 9.5 mg/dL (ref 8.9–10.3)
CHLORIDE: 93 mmol/L — AB (ref 101–111)
CO2: 35 mmol/L — AB (ref 22–32)
CREATININE: 1.68 mg/dL — AB (ref 0.44–1.00)
GFR calc Af Amer: 30 mL/min — ABNORMAL LOW (ref >60)
GFR calc non Af Amer: 26 mL/min — ABNORMAL LOW (ref >60)
GLUCOSE: 137 mg/dL — AB (ref 65–99)
Potassium: 3.1 mmol/L — ABNORMAL LOW (ref 3.5–5.1)
SODIUM: 139 mmol/L (ref 135–145)
Total Bilirubin: 0.6 mg/dL (ref 0.3–1.2)
Total Protein: 8.7 g/dL — ABNORMAL HIGH (ref 6.5–8.1)

## 2015-09-24 LAB — I-STAT CG4 LACTIC ACID, ED: Lactic Acid, Venous: 1.66 mmol/L (ref 0.5–1.9)

## 2015-09-24 LAB — URINALYSIS, ROUTINE W REFLEX MICROSCOPIC
Bilirubin Urine: NEGATIVE
GLUCOSE, UA: NEGATIVE mg/dL
Hgb urine dipstick: NEGATIVE
KETONES UR: NEGATIVE mg/dL
LEUKOCYTES UA: NEGATIVE
NITRITE: NEGATIVE
PH: 7 (ref 5.0–8.0)
Protein, ur: NEGATIVE mg/dL
SPECIFIC GRAVITY, URINE: 1.013 (ref 1.005–1.030)

## 2015-09-24 LAB — POCT INR: INR: 2.5

## 2015-09-24 LAB — PROTIME-INR
INR: 2.21
Prothrombin Time: 24.9 seconds — ABNORMAL HIGH (ref 11.4–15.2)

## 2015-09-24 LAB — BRAIN NATRIURETIC PEPTIDE: B NATRIURETIC PEPTIDE 5: 406 pg/mL — AB (ref 0.0–100.0)

## 2015-09-24 LAB — DIGOXIN LEVEL: DIGOXIN LVL: 0.6 ng/mL — AB (ref 0.8–2.0)

## 2015-09-24 LAB — MAGNESIUM: Magnesium: 2.4 mg/dL (ref 1.7–2.4)

## 2015-09-24 MED ORDER — ONDANSETRON HCL 4 MG PO TABS: 4.0000 mg | ORAL_TABLET | Freq: Four times a day (QID) | ORAL | Status: DC | PRN

## 2015-09-24 MED ORDER — POTASSIUM CHLORIDE CRYS ER 20 MEQ PO TBCR: 20.0000 meq | EXTENDED_RELEASE_TABLET | Freq: Every day | ORAL | Status: DC

## 2015-09-24 MED ORDER — ONDANSETRON HCL 4 MG/2ML IJ SOLN
4.0000 mg | Freq: Once | INTRAMUSCULAR | Status: AC
  Administered 2015-09-24: 4 mg via INTRAVENOUS
  Filled 2015-09-24: qty 2

## 2015-09-24 MED ORDER — HYDROMORPHONE HCL 1 MG/ML IJ SOLN
1.0000 mg | INTRAMUSCULAR | Status: DC | PRN
  Administered 2015-09-25 – 2015-09-27 (×7): 1 mg via INTRAVENOUS
  Filled 2015-09-24 (×7): qty 1

## 2015-09-24 MED ORDER — SODIUM CHLORIDE 0.9 % IV BOLUS (SEPSIS)
500.0000 mL | Freq: Once | INTRAVENOUS | Status: AC
  Administered 2015-09-24: 500 mL via INTRAVENOUS

## 2015-09-24 MED ORDER — POTASSIUM CHLORIDE IN NACL 20-0.9 MEQ/L-% IV SOLN
INTRAVENOUS | Status: DC
  Administered 2015-09-24: 21:00:00 via INTRAVENOUS
  Filled 2015-09-24 (×2): qty 1000

## 2015-09-24 MED ORDER — ONDANSETRON HCL 4 MG/2ML IJ SOLN
4.0000 mg | Freq: Four times a day (QID) | INTRAMUSCULAR | Status: DC | PRN
  Administered 2015-09-25: 4 mg via INTRAVENOUS
  Filled 2015-09-24: qty 2

## 2015-09-24 MED ORDER — MORPHINE SULFATE (PF) 4 MG/ML IV SOLN
4.0000 mg | Freq: Once | INTRAVENOUS | Status: AC
Start: 2015-09-24 — End: 2015-09-24
  Administered 2015-09-24: 4 mg via INTRAVENOUS
  Filled 2015-09-24: qty 1

## 2015-09-24 MED ORDER — MORPHINE SULFATE (PF) 2 MG/ML IV SOLN
2.0000 mg | Freq: Once | INTRAVENOUS | Status: AC
Start: 2015-09-24 — End: 2015-09-24
  Administered 2015-09-24: 2 mg via INTRAVENOUS
  Filled 2015-09-24: qty 1

## 2015-09-24 MED ORDER — METOPROLOL TARTRATE 5 MG/5ML IV SOLN
5.0000 mg | Freq: Four times a day (QID) | INTRAVENOUS | Status: DC
  Filled 2015-09-24: qty 5

## 2015-09-24 MED ORDER — SODIUM CHLORIDE 0.9 % IV BOLUS (SEPSIS): 500.0000 mL | Freq: Once | INTRAVENOUS | Status: DC

## 2015-09-24 NOTE — Consult Note (Signed)
Chief Complaint:  Partial small bowel obstructon  History of Present Illness:  [name] is an 82 y.o. female who presents to [facility] with nausea and vomiting and CT scan suggesting partial SBO.  Patient underwent sigmoid colectomy in May by Georgette Dover and Rickey for perforated diverticulitis with abscess.    Past Medical History:  Diagnosis Date  . Anemia   . Arthritis   . Chronic atrial fibrillation (HCC)    a. on Coumadin, Digoxin, and Verapamil  . Chronic diastolic (congestive) heart failure ([place])    a. Echo 01/2015 with EF of 55-60%.  . History of GI bleed    a. secondary to AVM's. Treated with Fe infusion  . History of shingles   . HTN (hypertension)   . LVH (left ventricular hypertrophy)   . Obesity   . Pericardial effusion    a. s/p pericardial window 01/16/15  . Positive TB test   . Sleep apnea    mild-no cpap  . Stroke [facility])    a. 2013: right frontal  . Tachycardia-bradycardia syndrome [facility])    a. s/p [employer]view, model number [id number], serial number [id number] 12/2013    Past Surgical History:  Procedure Laterality Date  . BOWEL RESECTION N/A 05/31/2015   Procedure: SMALL BOWEL RESECTION;  Surgeon: Donnie Mesa, MD;  Location: [place];  Service: General;  Laterality: N/A;  . BREAST LUMPECTOMY Bilateral    negative for cancer  . CATARACT EXTRACTION W/ INTRAOCULAR LENS  IMPLANT, BILATERAL Bilateral   . CHOLECYSTECTOMY  2005  . COLON RESECTION N/A 05/31/2015   Procedure: SIGMOID COLECTOMY;  Surgeon: Donnie Mesa, MD;  Location: [place];  Service: General;  Laterality: N/A;  . COLONOSCOPY  08/11/2011   Procedure: COLONOSCOPY;  Surgeon: Jeryl Columbia, MD;  Location: [facility] ENDOSCOPY;  Service: Endoscopy;  Laterality: N/A;  . COLOSTOMY N/A 05/31/2015   Procedure: DESCENDING COLOSTOMY;  Surgeon: Donnie Mesa, MD;  Location: [place];  Service: General;  Laterality: N/A;  . DEBRIDEMENT OF ABDOMINAL WALL ABSCESS N/A 05/31/2015   Procedure: DRAINAGE OF PELVIC ABSCESS;  Surgeon: Donnie Mesa, MD;  Location: [place];  Service: General;  Laterality: N/A;  . ESOPHAGOGASTRODUODENOSCOPY  05/19/2011   Procedure: ESOPHAGOGASTRODUODENOSCOPY (EGD);  Surgeon: Lear Ng, MD;  Location: [facility] ENDOSCOPY;  Service: Endoscopy;  Laterality: N/A;  doctor aware of inr   will try to be here no later than 230  . ESOPHAGOGASTRODUODENOSCOPY  06/22/2011   Procedure: ESOPHAGOGASTRODUODENOSCOPY (EGD);  Surgeon: Arta Silence, MD;  Location: [facility] ENDOSCOPY;  Service: Endoscopy;  Laterality: N/A;  Check PT/INR in am  . GIVENS CAPSULE STUDY  06/23/2011   Procedure: GIVENS CAPSULE STUDY;  Surgeon: Arta Silence, MD;  Location: [facility] ENDOSCOPY;  Service: Endoscopy;  Laterality: N/A;  . HOT HEMOSTASIS  08/11/2011   Procedure: HOT HEMOSTASIS (ARGON PLASMA COAGULATION/BICAP);  Surgeon: Jeryl Columbia, MD;  Location: [facility];  Service: Endoscopy;  Laterality: N/A;  . INSERT / REPLACE / REMOVE PACEMAKER  2001   Last generator in 2006; interrogated Dec 2012  . LEAD REVISION N/A 01/02/2014   Procedure: LEAD REVISION;  Surgeon: Sanda Klein, MD;  Location: [place] CATH LAB;  Service: Cardiovascular;  Laterality: N/A;  . LIPOMA EXCISION Left 07/2013   wrist  . MASS EXCISION Left 09/14/2013   Procedure: EXCISION MASS LEFT WRIST;  Surgeon: Leanora Cover, MD;  Location: [place];  Service: Orthopedics;  Laterality: Left;  . PACEMAKER GENERATOR CHANGE N/A 01/02/2014   Procedure: PACEMAKER GENERATOR CHANGE;  Surgeon:  Mihai Croitoru, MD;  Location: [facility] CATH LAB;  Service: Cardiovascular;  Laterality: N/A;  . SUBXYPHOID PERICARDIAL WINDOW N/A 01/16/2015   Procedure: SUBXYPHOID PERICARDIAL WINDOW;  Surgeon: Edward B Gerhardt, MD;  Location: [facility] OR;  Service: Thoracic;  Laterality: N/A;  . TEE WITHOUT CARDIOVERSION N/A 01/16/2015   Procedure: TRANSESOPHAGEAL ECHOCARDIOGRAM (TEE);  Surgeon: Edward B Gerhardt, MD;  Location: [facility] OR;  Service: Thoracic;  Laterality: N/A;  . TONSILLECTOMY AND ADENOIDECTOMY  1950     Current Facility-Administered Medications  Medication Dose Route Frequency Provider Last Rate Last Dose  . 0.9 % NaCl with KCl 20 mEq/ L  infusion   Intravenous Continuous Rachel Morgan Little, MD       Current Outpatient Prescriptions  Medication Sig Dispense Refill  . Cholecalciferol (VITAMIN D3) 5000 units CAPS Take 5,000 Units by mouth daily.    . DIGITEK 125 MCG tablet Take 0.5 tablets by mouth as directed. Take 0.5 tablet 5 days a week  1  . magnesium gluconate (MAGONATE) 500 MG tablet Take 1 tablet (500 mg total) by mouth daily. 30 tablet 1  . metolazone (ZAROXOLYN) 2.5 MG tablet Take 1 tablet (2.5 mg total) by mouth 2 (two) times a week. 15 tablet 11  . Multiple Vitamin (MULTIVITAMIN WITH MINERALS) TABS tablet Take 1 tablet by mouth daily.    . oxyCODONE (ROXICODONE) 5 MG immediate release tablet 1 tablet every 8 hours for severe neck and back pain (Patient taking differently: 5 mgs every 4 to 6 hours for severe neck and back pain) 90 tablet 0  . potassium chloride (K-DUR) 10 MEQ tablet Take 2 tablets (20 mEq total) by mouth daily. With torsemide 180 tablet 2  . saccharomyces boulardii (FLORASTOR) 250 MG capsule Take 1 capsule (250 mg total) by mouth 2 (two) times daily. 60 capsule 1  . torsemide (DEMADEX) 20 MG tablet Take 60 tablets by mouth daily.     . verapamil (CALAN-SR) 120 MG CR tablet Take 1 tablet (120 mg total) by mouth daily. 30 tablet 1  . warfarin (COUMADIN) 5 MG tablet Take 1 tablet (5 mg total) by mouth daily at 6 PM. (Patient taking differently: Take 5 mg by mouth daily at 6 PM. Take 2.5mgs on Monday, Wednesday, and Friday, take 5mgs on all other days) 30 tablet 1   Anti-inflammatory enzyme [nutritional supplements]; Iodinated diagnostic agents; Spironolactone; Arthrotec [diclofenac-misoprostol]; Biaxin [clarithromycin]; and Plavix [clopidogrel bisulfate] Family History  Problem Relation Age of Onset  . Stroke Mother   . Stroke Father   . Pneumonia Father   .  Colon cancer Sister   . Colon cancer Daughter    Social History:   reports that she has never smoked. She has never used smokeless tobacco. She reports that she drinks alcohol. She reports that she does not use drugs.   REVIEW OF SYSTEMS : Negative except for see problem list  Physical Exam:   Blood pressure 122/57, pulse 74, temperature 97.5 F (36.4 C), temperature source Oral, resp. rate 18, SpO2 96 %. There is no height or weight on file to calculate BMI.  Gen:  WDWN WF NAD-coherent and talkative  Neurological: Alert and oriented to person, place, and time. Motor and sensory function is grossly intact  Head: Normocephalic and atraumatic.  Eyes: Conjunctivae are normal. Pupils are equal, round, and reactive to light. No scleral icterus.  Neck: Normal range of motion. Neck supple. No tracheal deviation or thyromegaly present.  Cardiovascular:  SR without murmurs or gallops.  No carotid bruits   Breast:  Not examined Respiratory: Effort normal.  No respiratory distress. No chest wall tenderness. Breath sounds normal.  No wheezes, rales or rhonchi.  Abdomen:  Ostomy without stool;  Full and nontender around the colostomy site;  No rebound or guarding GU:  Not examined Musculoskeletal: Normal range of motion. Extremities are nontender. No cyanosis, edema or clubbing noted Lymphadenopathy: No cervical, preauricular, postauricular or axillary adenopathy is present Skin: Skin is warm and dry. No rash noted. No diaphoresis. No erythema. No pallor. Pscyh: Normal mood and affect. Behavior is normal. Judgment and thought content normal.   LABORATORY RESULTS: Results for orders placed or performed during the hospital encounter of 09/24/15 (from the past 48 hour(s))  Comprehensive metabolic panel     Status: Abnormal   Collection Time: 09/24/15  6:39 PM  Result Value Ref Range   Sodium 139 135 - 145 mmol/L   Potassium 3.1 (L) 3.5 - 5.1 mmol/L   Chloride 93 (L) 101 - 111 mmol/L   CO2 35 (H) 22  - 32 mmol/L   Glucose, Bld 137 (H) 65 - 99 mg/dL   BUN 47 (H) 6 - 20 mg/dL   Creatinine, Ser 1.68 (H) 0.44 - 1.00 mg/dL   Calcium 9.5 8.9 - 10.3 mg/dL   Total Protein 8.7 (H) 6.5 - 8.1 g/dL   Albumin 4.2 3.5 - 5.0 g/dL   AST 34 15 - 41 U/L   ALT 24 14 - 54 U/L   Alkaline Phosphatase 102 38 - 126 U/L   Total Bilirubin 0.6 0.3 - 1.2 mg/dL   GFR calc non Af Amer 26 (L) >60 mL/min   GFR calc Af Amer 30 (L) >60 mL/min    Comment: (NOTE) The eGFR has been calculated using the CKD EPI equation. This calculation has not been validated in all clinical situations. eGFR's persistently <60 mL/min signify possible Chronic Kidney Disease.    Anion gap 11 5 - 15  Lipase, blood     Status: Abnormal   Collection Time: 09/24/15  6:39 PM  Result Value Ref Range   Lipase 53 (H) 11 - 51 U/L  CBC with Differential     Status: Abnormal   Collection Time: 09/24/15  6:39 PM  Result Value Ref Range   WBC 16.0 (H) 4.0 - 10.5 K/uL   RBC 4.53 3.87 - 5.11 MIL/uL   Hemoglobin 10.1 (L) 12.0 - 15.0 g/dL   HCT 33.5 (L) 36.0 - 46.0 %   MCV 74.0 (L) 78.0 - 100.0 fL   MCH 22.3 (L) 26.0 - 34.0 pg   MCHC 30.1 30.0 - 36.0 g/dL   RDW 15.7 (H) 11.5 - 15.5 %   Platelets 358 150 - 400 K/uL   Neutrophils Relative % 84 %   Lymphocytes Relative 8 %   Monocytes Relative 8 %   Eosinophils Relative 0 %   Basophils Relative 0 %   Neutro Abs 13.4 (H) 1.7 - 7.7 K/uL   Lymphs Abs 1.3 0.7 - 4.0 K/uL   Monocytes Absolute 1.3 (H) 0.1 - 1.0 K/uL   Eosinophils Absolute 0.0 0.0 - 0.7 K/uL   Basophils Absolute 0.0 0.0 - 0.1 K/uL   RBC Morphology ELLIPTOCYTES   Protime-INR     Status: Abnormal   Collection Time: 09/24/15  6:39 PM  Result Value Ref Range   Prothrombin Time 24.9 (H) 11.4 - 15.2 seconds   INR 2.21   Digoxin level     Status: Abnormal   Collection Time: 09/24/15  6:39  PM  Result Value Ref Range   Digoxin Level 0.6 (L) 0.8 - 2.0 ng/mL  Brain natriuretic peptide     Status: Abnormal   Collection Time: 09/24/15   6:39 PM  Result Value Ref Range   B Natriuretic Peptide 406.0 (H) 0.0 - 100.0 pg/mL  I-Stat CG4 Lactic Acid, ED     Status: None   Collection Time: 09/24/15  6:56 PM  Result Value Ref Range   Lactic Acid, Venous 1.66 0.5 - 1.9 mmol/L     RADIOLOGY RESULTS: Ct Renal Stone Study  Result Date: 09/24/2015 CLINICAL DATA:  Acute on chronic back pain. Left flank pain. Leukocytosis. EXAM: CT ABDOMEN AND PELVIS WITHOUT CONTRAST TECHNIQUE: Multidetector CT imaging of the abdomen and pelvis was performed following the standard protocol without IV contrast. COMPARISON:  09/23/2015 FINDINGS: Lower chest: Large and possibly loculated pericardial effusion, similar to prior. Extensive coronary atherosclerosis. Calcified mitral valve. Pacer leads. Moderate cardiomegaly. Mild atelectasis in the right middle lobe and lingula. Mild atelectasis in both lower lobes. Hepatobiliary: Cholecystectomy Pancreas: Unremarkable Spleen: Unremarkable Adrenals/Urinary Tract: Abnormal appearance the kidneys with cortical contrast remaining on board, but some clearing of the medullary contrast. As far as I am aware the patient received contrast 33 hours prior to today's scan, the persistent contrast in the cortex is indicative of renal dysfunction. There is some excreted contrast in the collecting systems in urinary bladder is well. Scarring in the kidneys, right greater than left. Bilateral renal cysts. Stomach/Bowel: Small bowel obstruction due to a peristomal hernia of small bowel, about the junction of the jejunum and ileum, extending above the descending colostomy site. Small bowel loops up to 4.1 cm. Distended stomach. Appendix normal. Rectal stump unremarkable. Vascular/Lymphatic: Aortoiliac atherosclerotic vascular disease. Scattered small retroperitoneal lymph nodes. Reproductive: Unremarkable Other: Diffuse subcutaneous and mesenteric edema, query generalized third spacing of fluid. Edema is most notable along the lower  anterior pelvic wall and pubis region. Musculoskeletal: Lumbar spondylosis and degenerative disc disease. IMPRESSION: 1. Small bowel obstruction due to a peristomal herniation of small bowel just above the descending colostomy. Small bowel dilated up to 4.1 cm in the stomach is distended. 2. Abnormal appearance the kidneys, with retained contrast medium in the renal cortex bilaterally, cannot exclude acute renal failure or contrast nephropathy causing this appearance. The patient last received IV contrast about 33 hours prior to today's scan, as best I know. 3. Stable large and possibly loculated pericardial effusion with extensive coronary atherosclerosis, calcified mitral valve, and moderate cardiomegaly. 4.  Aortoiliac atherosclerotic vascular disease. 5. Third spacing of fluid with generalized mesenteric and subcutaneous edema. 6. I do not see any renal stones but sensitivity for nonobstructive stones is low due to the presence of contrast medium within the urinary tract left over from yesterday. Electronically Signed   By: Walter  Liebkemann M.D.   On: 09/24/2015 20:14    Problem List: Patient Active Problem List   Diagnosis Date Noted  . SBO (small bowel obstruction) (HCC) 09/24/2015  . Gastrointestinal hemorrhage with melena   . Subtherapeutic anticoagulation   . Severe malnutrition (HCC) 06/20/2015  . Acute deep vein thrombosis (DVT) of brachial vein of right upper extremity (HCC)   . Localized edema   . Hypokalemia   . Hypomagnesemia   . Debilitated 06/14/2015  . History of creation of ostomy 06/14/2015  . Abdominal pain   . Chronic diastolic congestive heart failure (HCC)   . History of CVA with residual deficit   . Respiratory depression   .   Stool culture positive for Clostridium difficile   . On total parenteral nutrition (TPN)   . Urinary retention   . Leukocytosis   . Tachycardia   . Normocytic anemia   . AKI (acute kidney injury) (HCC)   . Status post partial colectomy   .  Ileus, postoperative   . Postoperative pain   . Diverticulitis of large intestine with perforation and abscess without bleeding   . CKD (chronic kidney disease) stage 3, GFR 30-59 ml/min 05/26/2015  . HOH (hard of hearing) 05/26/2015  . Protein-calorie malnutrition, moderate (HCC) 05/26/2015  . Diverticulitis of intestine with perforation and abscess 05/25/2015  . Diverticulosis   . Chronic thoracic back pain   . Acute on chronic diastolic heart failure (HCC)   . Perforation of intestine due to diverticulitis of gastrointestinal tract (HCC)   . SOB (shortness of breath) 05/01/2015  . Supratherapeutic INR   . Paroxysmal atrial fibrillation (HCC)   . Essential hypertension, benign   . Left sided abdominal pain   . Diverticulitis 03/08/2015  . Obesity   . Chronic atrial fibrillation (HCC)   . Upper airway cough syndrome   . Dyspnea 05/22/2014  . Tachycardia-bradycardia syndrome with symptomatic bradycardia 01/03/2014  . History of CVA (cerebrovascular accident) 10/25/2013  . Diabetes (HCC) 08/03/2013  . Myofacial muscle pain 10/16/2011  . Back pain, Left Flank 07/01/2011  . Cerebral embolism with cerebral infarction (HCC) 06/12/2011  . CVA (cerebral infarction) 05/20/2011  . Weakness of left side of body 05/16/2011  . Elevated digoxin level 05/16/2011  . Pacemaker 05/21/2010  . Edema 04/24/2010  . Pericardial effusion, Large   . Chronic anticoagulation - Coumadin, CHADS2VASC=7   . HTN (hypertension)     Assessment & Plan: Partial small bowel obstruction in patient with ostomy and paracolostomy hernia.  IV fluids, NG observation    Matt B. , MD, FACS  [facility] [phone] beeper [phone]  09/24/2015 10:44 PM     

## 2015-09-24 NOTE — Telephone Encounter (Signed)
Dr.Daub spoke with pts caregiver called, pt was in so much pain and could not speak on phone.  Pain has not responded to oxycodone.  Pt caregiver advised to call [phone] for transport to hospital to get back pain evaluated.  Dr. Everlene Farrier will call daughter [name]

## 2015-09-24 NOTE — Progress Notes (Signed)
[name] is a 82 y.o. female patient admitted from ED awake, alert - oriented  X 4 - no acute distress noted besides pain.  VSS - Blood pressure 91/60, pulse 88, temperature 98 F (36.7 C), temperature source Oral, resp. rate 18, weight 67.1 kg (147 lb 14.9 oz), SpO2 97 %.    IV in place, occlusive dsg intact without redness.  Orientation to room, and floor completed with information packet given to patient/family.  Patient declined safety video at this time.  Admission INP armband ID verified with patient/family, and in place.   SR up x 2, fall assessment complete, with patient and family able to verbalize understanding of risk associated with falls, and verbalized understanding to call nsg before up out of bed.  Call light within reach, patient able to voice, and demonstrate understanding.  Skin, clean-dry- intact without evidence of bruising, or skin tears.   No evidence of skin break down noted on exam.     Will cont to eval and treat per MD orders.  Marcy Salvo, RN 09/24/2015 11:33 PM

## 2015-09-24 NOTE — ED Notes (Signed)
MD, Hassell Done at bedside.

## 2015-09-24 NOTE — ED Triage Notes (Signed)
Patient from home with chronic back pain which worsened today.  Her doctor had given her oxycodone 5mg . Which she took this afternoon but it has not helped. EMS patient was writhing in pain when they arrrived.  EMS gave patient 251mcg. Of Fentanyl IV and her pain is now a 5/10.

## 2015-09-24 NOTE — ED Notes (Signed)
Attempted blood draw with no success.     

## 2015-09-24 NOTE — H&P (Signed)
History and Physical  Patient Name: [name]     [id number]    DOB: [birth year]    DOA: 09/24/2015 PCP: [name], MD  Cardiology: Dr. Abelina Bachelor    Patient coming from: Home  Chief Complaint: Back pain  HPI: [name] is a 82 y.o. female with a past medical history significant for Afib on warfarin with pacer, chronic diastolic CHF, chronic pericardial effusion s/p window in 2016, and recent recurrent diverticulitis s/p colostomy in May 2017 who presents with back pain and vomiting and AKI.  The patient had a complicated diverticulitis this spring ultimately requiring colectomy with colostomy last May.  Since, then has been doing okay, back at home living with her sister, walking with a walker, recently dealing with a flare of severe acute on chronic neck and thoracic back pain, thought to be musculoskeletal and degenerative by her PCP.    Today, after a large meal at [employer] she developed what she thought was *further* worsening of her thoracic back pain, except now in the left flank, was severe and constant and was associated with abdominal pain and vomiting and so called 9-1-1.    ED course: -Afebrile, heart rate and respirations normal, blood pressure 120/50, pulse oximetry normal -Na 139, K 3.1, Cr 1.7 (baseline 0.8-1.0), WBC 16 K, Hgb 10.1 (baseline 8-9), lactic acid normal, lipase 53, INR 2.2, digoxin level low -She was vomiting recurrently in the ER, and so CT renal stone protocol was ordered to evaluate her left flank pain and this showed a new small bowel obstruction, mesenteric edema, as well as retained contrast in the kidneys from the CT scan yesterday -The case was discussed with General Surgery who recommended NG tube and conservative mgmt.  They evaluated the patient and were unable to appreciate much tenderness at her ostomy site nor to feel anything reducible there   -Morphine and ondansetron were given with some relief and TRH were asked to evaluate for  admission for AKI and SBO   Of note, yesterday the patient had a CT of the abdomen and pelvis with IV contrast at [facility] to follow-up on some incidental renal findings on the CT performed this spring during her episodes of diverticulitis.  This scan did not confirm a renal mass or findings suspicious of malignancy.  With regard to her neck and back pain (which are the patient's main concern) she has had new worsening of her chronic neck and upper back pain over about the last 3-4 weeks.  This was thought to be degenerative spine disease by her PCP, plain neck radiographs were unremarkable, and she was prescribed oxycodone.  At three week follow up one week ago, her symptoms were unchanged.  Tonight when her abdomen and flank pain started, she thought initially it was related to this neck pain.        ROS: Review of Systems  Constitutional: Negative for chills and fever.  Cardiovascular: Positive for leg swelling (chronic stable).  Gastrointestinal: Positive for abdominal pain, nausea and vomiting.  Musculoskeletal: Positive for back pain.  Neurological: Negative for weakness.  All other systems reviewed and are negative.       Past Medical History:  Diagnosis Date  . Anemia   . Arthritis   . Chronic atrial fibrillation (HCC)    a. on Coumadin, Digoxin, and Verapamil  . Chronic diastolic (congestive) heart failure ([place])    a. Echo 01/2015 with EF of 55-60%.  . History of GI bleed  a. secondary to AVM's. Treated with Fe infusion  . History of shingles   . HTN (hypertension)   . LVH (left ventricular hypertrophy)   . Obesity   . Pericardial effusion    a. s/p pericardial window 01/16/15  . Positive TB test   . Sleep apnea    mild-no cpap  . Stroke [facility])    a. 2013: right frontal  . Tachycardia-bradycardia syndrome [facility])    a. s/p Med[employer]onic [place], model number [id number], serial number [id number] H 12/2013    Past Surgical History:  Procedure Laterality Date  .  BOWEL RESECTION N/A 05/31/2015   Procedure: SMALL BOWEL RESECTION;  Surgeon: Donnie Mesa, MD;  Location: [place];  Service: General;  Laterality: N/A;  . BREAST LUMPECTOMY Bilateral    negative for cancer  . CATARACT EXTRACTION W/ INTRAOCULAR LENS  IMPLANT, BILATERAL Bilateral   . CHOLECYSTECTOMY  2005  . COLON RESECTION N/A 05/31/2015   Procedure: SIGMOID COLECTOMY;  Surgeon: Donnie Mesa, MD;  Location: [place];  Service: General;  Laterality: N/A;  . COLONOSCOPY  08/11/2011   Procedure: COLONOSCOPY;  Surgeon: Jeryl Columbia, MD;  Location: [facility];  Service: Endoscopy;  Laterality: N/A;  . COLOSTOMY N/A 05/31/2015   Procedure: DESCENDING COLOSTOMY;  Surgeon: Donnie Mesa, MD;  Location: [place];  Service: General;  Laterality: N/A;  . DEBRIDEMENT OF ABDOMINAL WALL ABSCESS N/A 05/31/2015   Procedure: DRAINAGE OF PELVIC ABSCESS;  Surgeon: Donnie Mesa, MD;  Location: [place];  Service: General;  Laterality: N/A;  . ESOPHAGOGASTRODUODENOSCOPY  05/19/2011   Procedure: ESOPHAGOGASTRODUODENOSCOPY (EGD);  Surgeon: Lear Ng, MD;  Location: [facility] ENDOSCOPY;  Service: Endoscopy;  Laterality: N/A;  doctor aware of inr   will try to be here no later than 230  . ESOPHAGOGASTRODUODENOSCOPY  06/22/2011   Procedure: ESOPHAGOGASTRODUODENOSCOPY (EGD);  Surgeon: Arta Silence, MD;  Location[facility]COPY;  Service: Endoscopy;  Laterality: N/A;  Check PT/INR in am  . GIVENS CAPSULE STUDY  06/23/2011   Procedure: GIVENS CAPSULE STUDY;  Surgeon: Arta Silence, MD;  Location: [facility] ENDOSCOPY;  Service: Endoscopy;  Laterality: N/A;  . HOT HEMOSTASIS  08/11/2011   Procedure: HOT HEMOSTASIS (ARGON PLASMA COAGULATION/BICAP);  Surgeon: Jeryl Columbia, MD;  Location: [facility];  Service: Endoscopy;  Laterality: N/A;  . INSERT / REPLACE / REMOVE PACEMAKER  2001   Last generator in 2006; interrogated Dec 2012  . LEAD REVISION N/A 01/02/2014   Procedure: LEAD REVISION;  Surgeon: Sanda Klein, MD;  Location: [place] CATH LAB;   Service: Cardiovascular;  Laterality: N/A;  . LIPOMA EXCISION Left 07/2013   wrist  . MASS EXCISION Left 09/14/2013   Procedure: EXCISION MASS LEFT WRIST;  Surgeon: Leanora Cover, MD;  Location: [place];  Service: Orthopedics;  Laterality: Left;  . PACEMAKER GENERATOR CHANGE N/A 01/02/2014   Procedure: PACEMAKER GENERATOR CHANGE;  Surgeon: Sanda Klein, MD;  Location: [place] CATH LAB;  Service: Cardiovascular;  Laterality: N/A;  . SUBXYPHOID PERICARDIAL WINDOW N/A 01/16/2015   Procedure: SUBXYPHOID PERICARDIAL WINDOW;  Surgeon: Grace Isaac, MD;  Location: [place];  Service: Thoracic;  Laterality: N/A;  . TEE WITHOUT CARDIOVERSION N/A 01/16/2015   Procedure: TRANSESOPHAGEAL ECHOCARDIOGRAM (TEE);  Surgeon: Grace Isaac, MD;  Location: [place];  Service: Thoracic;  Laterality: N/A;  . TONSILLECTOMY AND ADENOIDECTOMY  1950    Social History: Patient lives with her sister and niece.  The patient walks with a walker.  She can ambulate about 50 feet normally.  She is  a never smoker.  She was a Marine scientist.  She is from [place].    Allergies  Allergen Reactions  . Anti-Inflammatory Enzyme [Nutritional Supplements] Other (See Comments)    Retains fluids and headaches  . Iodinated Diagnostic Agents Hives    HIVES 15MIN S/P IV CONTRAST INJECTION,WILL NEED 13 HR PREP FOR FUTURE INJECTIONS, ok s/p 50mg  po benadryl//[place]  . Spironolactone Other (See Comments)    Hair loss  . Arthrotec [Diclofenac-Misoprostol] Other (See Comments)    unknown  . Biaxin [Clarithromycin] Other (See Comments)    unknown  . Plavix [Clopidogrel Bisulfate] Other (See Comments)    unknown    Family history: family history includes Colon cancer in her daughter and sister; Pneumonia in her father; Stroke in her father and mother.  Prior to Admission medications   Medication Sig Start Date End Date Taking? Authorizing Provider  DIGITEK 125 MCG tablet Take 0.5 tablets by mouth daily. 06/26/15   Historical  Provider, MD  magnesium gluconate (MAGONATE) 500 MG tablet Take 1 tablet (500 mg total) by mouth daily. 06/26/15   Lavon Paganini Angiulli, PA-C  metolazone (ZAROXOLYN) 2.5 MG tablet Take 1 tablet (2.5 mg total) by mouth 2 (two) times a week. 09/02/15 10/26/16  Mihai Croitoru, MD  oxyCODONE (ROXICODONE) 5 MG immediate release tablet 1 tablet every 8 hours for severe neck and back pain 09/18/15   Darlyne Russian, MD  potassium chloride (K-DUR) 10 MEQ tablet Take 2 tablets (20 mEq total) by mouth daily. With torsemide 08/12/15   Mihai Croitoru, MD  saccharomyces boulardii (FLORASTOR) 250 MG capsule Take 1 capsule (250 mg total) by mouth 2 (two) times daily. 06/26/15   Lavon Paganini Angiulli, PA-C  torsemide (DEMADEX) 20 MG tablet Take 3 tablets by mouth daily. 08/19/15   Historical Provider, MD  verapamil (CALAN-SR) 120 MG CR tablet Take 1 tablet (120 mg total) by mouth daily. 06/26/15   Lavon Paganini Angiulli, PA-C  warfarin (COUMADIN) 5 MG tablet Take 1 tablet (5 mg total) by mouth daily at 6 PM. 06/26/15   Cathlyn Parsons, PA-C       Physical Exam: BP 119/65 (BP Location: Right Arm)   Pulse 79   Temp 97.5 F (36.4 C) (Oral)   Resp 18   SpO2 93%  General appearance: Think frail appearing elderly adult female, alert and in severe discomfort from pain.   Eyes: Anicteric, conjunctiva pink, lids and lashes normal.     ENT: No nasal deformity, discharge, or epistaxis.  OP moist without lesions.   Lymph: No cervical or supraclavicular lymphadenopathy. Skin: Warm and dry.  No jaundice.  No suspicious rashes or lesions. Cardiac: RRR, nl [id number], loud systolic murmur.  Capillary refill is brisk.  JVP normal.  3+ bilateral LE edema to knees.  Radial and DP pulses 2+ and symmetric. Respiratory: Normal respiratory rate and rhythm.  CTAB without rales or wheezes. GI: Abdomen soft without rigidity.  Mild guarding.  No TTP around ostomy, moderate periumbilical pain.  Ostomy in place.  Large old scar.  No significant distension.     MSK: No deformities or effusions.  No clubbing/cyanosis. Neuro: Cranial nerves normal.  Sensorium intact and responding to questions, attention normal.  Speech is fluent.  Moves all extremities equally and with normal coordination.    Psych: Affect in pain.  Judgment and insight appear normal.       Labs on Admission:  I have personally reviewed following labs and imaging studies: CBC:  Recent Labs Lab 09/18/15  1121 09/24/15 1839  WBC 4.2* 16.0*  NEUTROABS  --  13.4*  HGB 9.2* 10.1*  HCT 28.0* 33.5*  MCV 70.5* 74.0*  PLT  --  123456   Basic Metabolic Panel:  Recent Labs Lab 09/18/15 1111 09/24/15 1839  NA 138 139  K 2.8* 3.1*  CL 90* 93*  CO2 38* 35*  GLUCOSE 115* 137*  BUN 38* 47*  CREATININE 1.35* 1.68*  CALCIUM 9.4 9.5  MG 2.2  --    GFR: Estimated Creatinine Clearance: 20.7 mL/min (by C-G formula based on SCr of 1.68 mg/dL).  Liver Function Tests:  Recent Labs Lab 09/24/15 1839  AST 34  ALT 24  ALKPHOS 102  BILITOT 0.6  PROT 8.7*  ALBUMIN 4.2    Recent Labs Lab 09/24/15 1839  LIPASE 53*   Coagulation Profile:  Recent Labs Lab 09/24/15 1839  INR 2.21   Sepsis Labs: Lactic acid normal      Radiological Exams on Admission: Personally reviewed and discussed with Radiology.  CT shows delayed nephrogram from contrast retained from yesterday.  Also, shows mesenteric edema.  Also shows SBO protruding into peristomal space, this SBO is new from scan yesterday.  Also, it shows pericardial effusion unchanged since at least February: Ct Renal Stone Study  Result Date: 09/24/2015 CLINICAL DATA:  Acute on chronic back pain. Left flank pain. Leukocytosis. EXAM: CT ABDOMEN AND PELVIS WITHOUT CONTRAST TECHNIQUE: Multidetector CT imaging of the abdomen and pelvis was performed following the standard protocol without IV contrast. COMPARISON:  09/23/2015 FINDINGS: Lower chest: Large and possibly loculated pericardial effusion, similar to prior. Extensive  coronary atherosclerosis. Calcified mitral valve. Pacer leads. Moderate cardiomegaly. Mild atelectasis in the right middle lobe and lingula. Mild atelectasis in both lower lobes. Hepatobiliary: Cholecystectomy Pancreas: Unremarkable Spleen: Unremarkable Adrenals/Urinary Tract: Abnormal appearance the kidneys with cortical contrast remaining on board, but some clearing of the medullary contrast. As far as I am aware the patient received contrast 33 hours prior to today's scan, the persistent contrast in the cortex is indicative of renal dysfunction. There is some excreted contrast in the collecting systems in urinary bladder is well. Scarring in the kidneys, right greater than left. Bilateral renal cysts. Stomach/Bowel: Small bowel obstruction due to a peristomal hernia of small bowel, about the junction of the jejunum and ileum, extending above the descending colostomy site. Small bowel loops up to 4.1 cm. Distended stomach. Appendix normal. Rectal stump unremarkable. Vascular/Lymphatic: Aortoiliac atherosclerotic vascular disease. Scattered small retroperitoneal lymph nodes. Reproductive: Unremarkable Other: Diffuse subcutaneous and mesenteric edema, query generalized third spacing of fluid. Edema is most notable along the lower anterior pelvic wall and pubis region. Musculoskeletal: Lumbar spondylosis and degenerative disc disease. IMPRESSION: 1. Small bowel obstruction due to a peristomal herniation of small bowel just above the descending colostomy. Small bowel dilated up to 4.1 cm in the stomach is distended. 2. Abnormal appearance the kidneys, with retained contrast medium in the renal cortex bilaterally, cannot exclude acute renal failure or contrast nephropathy causing this appearance. The patient last received IV contrast about 33 hours prior to today's scan, as best I know. 3. Stable large and possibly loculated pericardial effusion with extensive coronary atherosclerosis, calcified mitral valve, and  moderate cardiomegaly. 4.  Aortoiliac atherosclerotic vascular disease. 5. Third spacing of fluid with generalized mesenteric and subcutaneous edema. 6. I do not see any renal stones but sensitivity for nonobstructive stones is low due to the presence of contrast medium within the urinary tract left over from yesterday. Electronically  Signed   By: Van Clines M.D.   On: 09/24/2015 20:14    Echocardiogram Jan 2017: EF 55=60%, mild to moderate AR, small pericardial effusion.     Assessment/Plan 1. AKI:  Suspect contrast nephropathy.  Possibly pre-renal.  No evidence of obstruction or stone on CT. -Gentle fluids -Strict I/Os -Check UA -Check FeUrea -Daily BMP   2. SBO:  -NG tubed -MIVF with K -Hydromorphone for pain, ondansetron for nausea -Consult to General Surgery, appreciate cares  3. Chronic Afib:  CHADS2Vasc 7. -Continue digoxin as IV -Check dig level Friday -Metoprolol to replace verapamil -Hold warfarin while NPO -Lovenox dosed therapeutically per pharmacy  4. HTN:  -Hold home verapamil while NPO -Metoprolol IV scheduled while NPO  5. Chronic diastolic CHF:  Suspect she is euvolemic.  At baseline weight 147 lbs, without elevated JVP.  Getting fluids however for AKI, and so fluid balance will be a concern. -Strict I/Os, daily weights, daily BMP -Hold home torsemide and metolazone today, likely need to restart soon -Digoxin converted to IV while NPO  6. Hypokalemia:  -IVF with K -Check magnesium  7. Normocytic anemia:  Stable, at baseline.  8. Pericardial effusion: S/p window last Dec.  No evidence of tamponade or change per CT.  9. Neck and back pain: Presumed degenerative in origin.   -Continue Percocet PRN        DVT prophylaxis: On full dose anticoagulation for Afib  Code Status: FULL  Family Communication: Daughter and son-in-law at bedside.  Status discussed, an opportunity for quesitons was given and all questions were answered.  CODE  STATUS was confirmed with family.  Disposition Plan: Anticipate conservative management of SBO.  Hydration and close monitoring of renal function. Consults called: General Surgery, Dr. Hassell Done Admission status: INPATIENT, med surg    Medical decision making: Patient seen at 9:16 PM on 09/24/2015.  The patient was discussed with Dr. Rex Kras and Dr. Hassell Done and Dr. Janeece Fitting. What exists of the patient's chart was reviewed in depth.  Clinical condition: stable.        Edwin Dada [facility] Pager [phone]

## 2015-09-24 NOTE — ED Provider Notes (Signed)
[place] DEPT Provider Note   CSN: [id number] Arrival date & time: 09/24/15  1707     History   Chief Complaint Chief Complaint  Patient presents with  . Back Pain    HPI [name] is a 82 y.o. female.  82yo F w/ PMH including dCHF, A fib on coumadin, CVA, chronic back pain who p/w back pain. History limited because of the patient's distress and obtained primarily from daughter. The patient reports a history of chronic back pain for which she has been on chronic narcotics. Over the past few days she was given oxycodone by her PCP which has not helped the pain. Last dose of oxycodone was 5 mg earlier today. She states that over the past few weeks her back pain has been much worse than usual. The pain was originally between her shoulder blades in her thoracic back pain has now moved to her left flank. The pain is severe and constant. She began having nausea and vomiting after lunch today. She denies any abdominal pain, urinary symptoms, fevers.   LEVEL 5 CAVEAT DUE TO SEVERE DISTRESS AND VOMITING   The history is provided by the patient and a relative.  Back Pain      Past Medical History:  Diagnosis Date  . Anemia   . Arthritis   . Chronic atrial fibrillation (HCC)    a. on Coumadin, Digoxin, and Verapamil  . Chronic diastolic (congestive) heart failure ([place])    a. Echo 01/2015 with EF of 55-60%.  . History of GI bleed    a. secondary to AVM's. Treated with Fe infusion  . History of shingles   . HTN (hypertension)   . LVH (left ventricular hypertrophy)   . Obesity   . Pericardial effusion    a. s/p pericardial window 01/16/15  . Positive TB test   . Sleep apnea    mild-no cpap  . Stroke [facility])    a. 2013: right frontal  . Tachycardia-bradycardia syndrome [facility])    a. s/p [employer] [place], model number [id number], serial number [id number] H 12/2013    Patient Active Problem List   Diagnosis Date Noted  . SBO (small bowel obstruction) ([place]) 09/24/2015  .  Gastrointestinal hemorrhage with melena   . Subtherapeutic anticoagulation   . Severe malnutrition ([place]) 06/20/2015  . Acute deep vein thrombosis (DVT) of brachial vein of right upper extremity ([place])   . Localized edema   . Hypokalemia   . Hypomagnesemia   . Debilitated 06/14/2015  . History of creation of ostomy 06/14/2015  . Abdominal pain   . Chronic diastolic congestive heart failure ([place])   . History of CVA with residual deficit   . Respiratory depression   . Stool culture positive for Clostridium difficile   . On total parenteral nutrition (TPN)   . Urinary retention   . Leukocytosis   . Tachycardia   . Acute blood loss anemia   . AKI (acute kidney injury) ([place])   . Status post partial colectomy   . Ileus, postoperative   . Postoperative pain   . Diverticulitis of large intestine with perforation and abscess without bleeding   . CKD (chronic kidney disease) stage 3, GFR 30-59 ml/min 05/26/2015  . HOH (hard of hearing) 05/26/2015  . Protein-calorie malnutrition, moderate ([place]) 05/26/2015  . Diverticulitis of intestine with perforation and abscess 05/25/2015  . Diverticulosis   . Chronic thoracic back pain   . Acute on chronic diastolic heart failure ([place])   . Perforation of intestine  due to diverticulitis of gastrointestinal tract ([place])   . SOB (shortness of breath) 05/01/2015  . Supratherapeutic INR   . Paroxysmal atrial fibrillation (HCC)   . Essential hypertension, benign   . Left sided abdominal pain   . Diverticulitis 03/08/2015  . Obesity   . Chronic atrial fibrillation ([place])   . Upper airway cough syndrome   . Dyspnea 05/22/2014  . Tachycardia-bradycardia syndrome with symptomatic bradycardia 01/03/2014  . History of CVA (cerebrovascular accident) 10/25/2013  . Diabetes ([place]) 08/03/2013  . Myofacial muscle pain 10/16/2011  . Back pain, Left Flank 07/01/2011  . Cerebral embolism with cerebral infarction ([place]) 06/12/2011  . CVA (cerebral infarction)  05/20/2011  . Weakness of left side of body 05/16/2011  . Elevated digoxin level 05/16/2011  . Pacemaker 05/21/2010  . Edema 04/24/2010  . Pericardial effusion, Large   . Chronic anticoagulation - Coumadin, CHADS2VASC=7   . HTN (hypertension)     Past Surgical History:  Procedure Laterality Date  . BOWEL RESECTION N/A 05/31/2015   Procedure: SMALL BOWEL RESECTION;  Surgeon: Donnie Mesa, MD;  Location: [place];  Service: General;  Laterality: N/A;  . BREAST LUMPECTOMY Bilateral    negative for cancer  . CATARACT EXTRACTION W/ INTRAOCULAR LENS  IMPLANT, BILATERAL Bilateral   . CHOLECYSTECTOMY  2005  . COLON RESECTION N/A 05/31/2015   Procedure: SIGMOID COLECTOMY;  Surgeon: Donnie Mesa, MD;  Location: [place];  Service: General;  Laterality: N/A;  . COLONOSCOPY  08/11/2011   Procedure: COLONOSCOPY;  Surgeon: Jeryl Columbia, MD;  Location: [facility] ENDOSCOPY;  Service: Endoscopy;  Laterality: N/A;  . COLOSTOMY N/A 05/31/2015   Procedure: DESCENDING COLOSTOMY;  Surgeon: Donnie Mesa, MD;  Location: [place];  Service: General;  Laterality: N/A;  . DEBRIDEMENT OF ABDOMINAL WALL ABSCESS N/A 05/31/2015   Procedure: DRAINAGE OF PELVIC ABSCESS;  Surgeon: Donnie Mesa, MD;  Location: [place];  Service: General;  Laterality: N/A;  . ESOPHAGOGASTRODUODENOSCOPY  05/19/2011   Procedure: ESOPHAGOGASTRODUODENOSCOPY (EGD);  Surgeon: Lear Ng, MD;  Location: [facility] ENDOSCOPY;  Service: Endoscopy;  Laterality: N/A;  doctor aware of inr   will try to be here no later than 230  . ESOPHAGOGASTRODUODENOSCOPY  06/22/2011   Procedure: ESOPHAGOGASTRODUODENOSCOPY (EGD);  Surgeon: Arta Silence, MD;  Location: [facility] ENDOSCOPY;  Service: Endoscopy;  Laterality: N/A;  Check PT/INR in am  . GIVENS CAPSULE STUDY  06/23/2011   Procedure: GIVENS CAPSULE STUDY;  Surgeon: Arta Silence, MD;  Location: [facility] ENDOSCOPY;  Service: Endoscopy;  Laterality: N/A;  . HOT HEMOSTASIS  08/11/2011   Procedure: HOT HEMOSTASIS (ARGON PLASMA  COAGULATION/BICAP);  Surgeon: Jeryl Columbia, MD;  Location: [facility];  Service: Endoscopy;  Laterality: N/A;  . INSERT / REPLACE / REMOVE PACEMAKER  2001   Last generator in 2006; interrogated Dec 2012  . LEAD REVISION N/A 01/02/2014   Procedure: LEAD REVISION;  Surgeon: Sanda Klein, MD;  Location: [place] CATH LAB;  Service: Cardiovascular;  Laterality: N/A;  . LIPOMA EXCISION Left 07/2013   wrist  . MASS EXCISION Left 09/14/2013   Procedure: EXCISION MASS LEFT WRIST;  Surgeon: Leanora Cover, MD;  Location: [place];  Service: Orthopedics;  Laterality: Left;  . PACEMAKER GENERATOR CHANGE N/A 01/02/2014   Procedure: PACEMAKER GENERATOR CHANGE;  Surgeon: Sanda Klein, MD;  Location: [place] CATH LAB;  Service: Cardiovascular;  Laterality: N/A;  . SUBXYPHOID PERICARDIAL WINDOW N/A 01/16/2015   Procedure: SUBXYPHOID PERICARDIAL WINDOW;  Surgeon: Grace Isaac, MD;  Location: [place];  Service:  Thoracic;  Laterality: N/A;  . TEE WITHOUT CARDIOVERSION N/A 01/16/2015   Procedure: TRANSESOPHAGEAL ECHOCARDIOGRAM (TEE);  Surgeon: Grace Isaac, MD;  Location: [place];  Service: Thoracic;  Laterality: N/A;  . TONSILLECTOMY AND ADENOIDECTOMY  1950    OB History    No data available       Home Medications    Prior to Admission medications   Medication Sig Start Date End Date Taking? Authorizing Provider  DIGITEK 125 MCG tablet Take 0.5 tablets by mouth daily. 06/26/15   Historical Provider, MD  magnesium gluconate (MAGONATE) 500 MG tablet Take 1 tablet (500 mg total) by mouth daily. 06/26/15   Lavon Paganini Angiulli, PA-C  metolazone (ZAROXOLYN) 2.5 MG tablet Take 1 tablet (2.5 mg total) by mouth 2 (two) times a week. 09/02/15 10/26/16  Mihai Croitoru, MD  oxyCODONE (ROXICODONE) 5 MG immediate release tablet 1 tablet every 8 hours for severe neck and back pain 09/18/15   Darlyne Russian, MD  potassium chloride (K-DUR) 10 MEQ tablet Take 2 tablets (20 mEq total) by mouth daily. With torsemide  08/12/15   Mihai Croitoru, MD  saccharomyces boulardii (FLORASTOR) 250 MG capsule Take 1 capsule (250 mg total) by mouth 2 (two) times daily. 06/26/15   Lavon Paganini Angiulli, PA-C  torsemide (DEMADEX) 20 MG tablet Take 3 tablets by mouth daily. 08/19/15   Historical Provider, MD  verapamil (CALAN-SR) 120 MG CR tablet Take 1 tablet (120 mg total) by mouth daily. 06/26/15   Lavon Paganini Angiulli, PA-C  warfarin (COUMADIN) 5 MG tablet Take 1 tablet (5 mg total) by mouth daily at 6 PM. 06/26/15   Cathlyn Parsons, PA-C    Family History Family History  Problem Relation Age of Onset  . Stroke Mother   . Stroke Father   . Pneumonia Father   . Colon cancer Sister   . Colon cancer Daughter     Social History Social History  Substance Use Topics  . Smoking status: Never Smoker  . Smokeless tobacco: Never Used  . Alcohol use 0.0 oz/week     Comment: 01/02/2014 "I'll have a drink a few times/year"     Allergies   Anti-inflammatory enzyme [nutritional supplements]; Iodinated diagnostic agents; Spironolactone; Arthrotec [diclofenac-misoprostol]; Biaxin [clarithromycin]; and Plavix [clopidogrel bisulfate]   Review of Systems Review of Systems  Unable to perform ROS: Other  Musculoskeletal: Positive for back pain.  severe distress and active vomiting   Physical Exam Updated Vital Signs BP 119/65 (BP Location: Right Arm)   Pulse 79   Temp 97.5 F (36.4 C) (Oral)   Resp 18   SpO2 93%   Physical Exam  Constitutional: She is oriented to person, place, and time. She appears distressed.  Elderly woman sitting in chair, hunched over and actively vomiting  HENT:  Head: Normocephalic and atraumatic.  Moist mucous membranes  Eyes: Conjunctivae are normal.  Neck: Neck supple.  Cardiovascular: Normal rate and normal heart sounds.  An irregularly irregular rhythm present.  No murmur heard. Pulmonary/Chest: Effort normal and breath sounds normal.  Abdominal: Soft. Bowel sounds are normal. She  exhibits distension. There is no tenderness.  Musculoskeletal: She exhibits edema (2+ BLE).  Neurological: She is alert and oriented to person, place, and time.  Fluent speech  Skin: Skin is warm and dry.  Psychiatric:  distressed  Nursing note and vitals reviewed.    ED Treatments / Results  Labs (all labs ordered are listed, but only abnormal results are displayed) Labs Reviewed  COMPREHENSIVE  METABOLIC PANEL - Abnormal; Notable for the following:       Result Value   Potassium 3.1 (*)    Chloride 93 (*)    CO2 35 (*)    Glucose, Bld 137 (*)    BUN 47 (*)    Creatinine, Ser 1.68 (*)    Total Protein 8.7 (*)    GFR calc non Af Amer 26 (*)    GFR calc Af Amer 30 (*)    All other components within normal limits  LIPASE, BLOOD - Abnormal; Notable for the following:    Lipase 53 (*)    All other components within normal limits  CBC WITH DIFFERENTIAL/PLATELET - Abnormal; Notable for the following:    WBC 16.0 (*)    Hemoglobin 10.1 (*)    HCT 33.5 (*)    MCV 74.0 (*)    MCH 22.3 (*)    RDW 15.7 (*)    Neutro Abs 13.4 (*)    Monocytes Absolute 1.3 (*)    All other components within normal limits  PROTIME-INR - Abnormal; Notable for the following:    Prothrombin Time 24.9 (*)    All other components within normal limits  DIGOXIN LEVEL - Abnormal; Notable for the following:    Digoxin Level 0.6 (*)    All other components within normal limits  URINALYSIS, ROUTINE W REFLEX MICROSCOPIC (NOT AT [facility])  BRAIN NATRIURETIC PEPTIDE  I-STAT CG4 LACTIC ACID, ED    EKG  EKG Interpretation None       Radiology Ct Renal Stone Study  Result Date: 09/24/2015 CLINICAL DATA:  Acute on chronic back pain. Left flank pain. Leukocytosis. EXAM: CT ABDOMEN AND PELVIS WITHOUT CONTRAST TECHNIQUE: Multidetector CT imaging of the abdomen and pelvis was performed following the standard protocol without IV contrast. COMPARISON:  09/23/2015 FINDINGS: Lower chest: Large and possibly loculated  pericardial effusion, similar to prior. Extensive coronary atherosclerosis. Calcified mitral valve. Pacer leads. Moderate cardiomegaly. Mild atelectasis in the right middle lobe and lingula. Mild atelectasis in both lower lobes. Hepatobiliary: Cholecystectomy Pancreas: Unremarkable Spleen: Unremarkable Adrenals/Urinary Tract: Abnormal appearance the kidneys with cortical contrast remaining on board, but some clearing of the medullary contrast. As far as I am aware the patient received contrast 33 hours prior to today's scan, the persistent contrast in the cortex is indicative of renal dysfunction. There is some excreted contrast in the collecting systems in urinary bladder is well. Scarring in the kidneys, right greater than left. Bilateral renal cysts. Stomach/Bowel: Small bowel obstruction due to a peristomal hernia of small bowel, about the junction of the jejunum and ileum, extending above the descending colostomy site. Small bowel loops up to 4.1 cm. Distended stomach. Appendix normal. Rectal stump unremarkable. Vascular/Lymphatic: Aortoiliac atherosclerotic vascular disease. Scattered small retroperitoneal lymph nodes. Reproductive: Unremarkable Other: Diffuse subcutaneous and mesenteric edema, query generalized third spacing of fluid. Edema is most notable along the lower anterior pelvic wall and pubis region. Musculoskeletal: Lumbar spondylosis and degenerative disc disease. IMPRESSION: 1. Small bowel obstruction due to a peristomal herniation of small bowel just above the descending colostomy. Small bowel dilated up to 4.1 cm in the stomach is distended. 2. Abnormal appearance the kidneys, with retained contrast medium in the renal cortex bilaterally, cannot exclude acute renal failure or contrast nephropathy causing this appearance. The patient last received IV contrast about 33 hours prior to today's scan, as best I know. 3. Stable large and possibly loculated pericardial effusion with extensive coronary  atherosclerosis, calcified mitral valve, and  moderate cardiomegaly. 4.  Aortoiliac atherosclerotic vascular disease. 5. Third spacing of fluid with generalized mesenteric and subcutaneous edema. 6. I do not see any renal stones but sensitivity for nonobstructive stones is low due to the presence of contrast medium within the urinary tract left over from yesterday. Electronically Signed   By: Van Clines M.D.   On: 09/24/2015 20:14    Procedures Procedures (including critical care time)  Medications Ordered in ED Medications  0.9 % NaCl with KCl 20 mEq/ L  infusion (not administered)  morphine 2 MG/ML injection 2 mg (not administered)  ondansetron (ZOFRAN) injection 4 mg (not administered)  sodium chloride 0.9 % bolus 500 mL (500 mLs Intravenous New Bag/Given 09/24/15 1849)  morphine 4 MG/ML injection 4 mg (4 mg Intravenous Given 09/24/15 1850)  ondansetron (ZOFRAN) injection 4 mg (4 mg Intravenous Given 09/24/15 1850)  ondansetron (ZOFRAN) injection 4 mg (4 mg Intravenous Given 09/24/15 2034)     Initial Impression / Assessment and Plan / ED Course  I have reviewed the triage vital signs and the nursing notes.  Pertinent labs & imaging results that were available during my care of the patient were reviewed by me and considered in my medical decision making (see chart for details).  Clinical Course    PT w/ h/o chronic back pain w/ worsening back pain x a few weeks, now in left flank. Vomiting began today. She was in distress due to pain and actively vomiting on my examination. She received 200 g fentanyl by EMS. Denied any abdominal pain. Because of degree of vomiting, obtained labs to evaluate for other process aside from musculoskeletal etiology, such as kidney stone. Gave the patient morphine, Zofran, and small IV fluid bolus.  Lab work shows potassium 3.1, BUN 47 and creatinine 1.68 which is climbing from earlier this month, WBC 16,000. Normal lactate, INR 2.2. CT shows small  bowel obstruction due to peristomal herniation of small bowel near colostomy, as well as abnormal appearance of kidneys with contrast still in the kidneys. I went back to talk with the family and they later admitted that she had had a CT scan in the last day. I discussed CT findings with Dr. Hassell Done, general surgery, who would like to medically manage her bowel obstruction given her comorbidities and advanced age as she will likely be a poor surgical candidate. I have ordered an NG tube. Discussed admission with [facility] hospitalist, Dr. Loleta Books, and pt admitted for further treatment.  Final Clinical Impressions(s) / ED Diagnoses   Final diagnoses:  SBO (small bowel obstruction) (HCC)  Midline back pain, unspecified location  AKI (acute kidney injury) [facility])    New Prescriptions New Prescriptions   No medications on file     Sharlett Iles, MD 09/24/15 2118

## 2015-09-25 ENCOUNTER — Inpatient Hospital Stay (HOSPITAL_COMMUNITY): Payer: Medicare Other

## 2015-09-25 ENCOUNTER — Ambulatory Visit (INDEPENDENT_AMBULATORY_CARE_PROVIDER_SITE_OTHER): Payer: Medicare Other | Admitting: Pharmacist

## 2015-09-25 DIAGNOSIS — E876 Hypokalemia: Secondary | ICD-10-CM

## 2015-09-25 DIAGNOSIS — Z933 Colostomy status: Secondary | ICD-10-CM

## 2015-09-25 DIAGNOSIS — K5669 Other intestinal obstruction: Secondary | ICD-10-CM

## 2015-09-25 DIAGNOSIS — I6310 Cerebral infarction due to embolism of unspecified precerebral artery: Secondary | ICD-10-CM

## 2015-09-25 DIAGNOSIS — I482 Chronic atrial fibrillation: Secondary | ICD-10-CM

## 2015-09-25 DIAGNOSIS — Z7901 Long term (current) use of anticoagulants: Secondary | ICD-10-CM

## 2015-09-25 DIAGNOSIS — N179 Acute kidney failure, unspecified: Principal | ICD-10-CM

## 2015-09-25 LAB — PROTIME-INR
INR: 2.05
INR: 1.89
PROTHROMBIN TIME: 22 s — AB (ref 11.4–15.2)
Prothrombin Time: 23.4 seconds — ABNORMAL HIGH (ref 11.4–15.2)

## 2015-09-25 LAB — BASIC METABOLIC PANEL
ANION GAP: 9 (ref 5–15)
BUN: 41 mg/dL — AB (ref 7–25)
BUN: 48 mg/dL — AB (ref 6–20)
CALCIUM: 9.3 mg/dL (ref 8.6–10.4)
CALCIUM: 8.9 mg/dL (ref 8.9–10.3)
CHLORIDE: 95 mmol/L — AB (ref 98–110)
CO2: 33 mmol/L — ABNORMAL HIGH (ref 20–31)
CO2: 37 mmol/L — ABNORMAL HIGH (ref 22–32)
CREATININE: 1.61 mg/dL — AB (ref 0.60–0.88)
Chloride: 96 mmol/L — ABNORMAL LOW (ref 101–111)
Creatinine, Ser: 1.56 mg/dL — ABNORMAL HIGH (ref 0.44–1.00)
GFR calc Af Amer: 33 mL/min — ABNORMAL LOW (ref >60)
GFR, EST NON AFRICAN AMERICAN: 28 mL/min — AB (ref >60)
GLUCOSE: 132 mg/dL — AB (ref 65–99)
Glucose, Bld: 136 mg/dL — ABNORMAL HIGH (ref 65–99)
Potassium: 4 mmol/L (ref 3.5–5.3)
Potassium: 3.9 mmol/L (ref 3.5–5.1)
SODIUM: 142 mmol/L (ref 135–145)
Sodium: 139 mmol/L (ref 135–146)

## 2015-09-25 LAB — CREATININE, URINE, RANDOM: Creatinine, Urine: 28.82 mg/dL

## 2015-09-25 LAB — CBC
HCT: 31.8 % — ABNORMAL LOW (ref 36.0–46.0)
Hemoglobin: 9.7 g/dL — ABNORMAL LOW (ref 12.0–15.0)
MCH: 22.4 pg — AB (ref 26.0–34.0)
MCHC: 30.5 g/dL (ref 30.0–36.0)
MCV: 73.3 fL — AB (ref 78.0–100.0)
PLATELETS: 289 10*3/uL (ref 150–400)
RBC: 4.34 MIL/uL (ref 3.87–5.11)
RDW: 15.6 % — AB (ref 11.5–15.5)
WBC: 11.7 10*3/uL — AB (ref 4.0–10.5)

## 2015-09-25 LAB — SODIUM, URINE, RANDOM: Sodium, Ur: 61 mmol/L

## 2015-09-25 MED ORDER — DEXTROSE-NACL 5-0.9 % IV SOLN
INTRAVENOUS | Status: DC
Start: 2015-09-25 — End: 2015-09-26
  Administered 2015-09-25 (×2): via INTRAVENOUS

## 2015-09-25 MED ORDER — DIGOXIN 0.0625 MG HALF TABLET
0.0625 mg | ORAL_TABLET | Freq: Every day | ORAL | Status: DC
  Administered 2015-09-26 – 2015-09-30 (×5): 0.0625 mg via ORAL
  Filled 2015-09-25 (×5): qty 1

## 2015-09-25 MED ORDER — METOPROLOL TARTRATE 5 MG/5ML IV SOLN: 5.0000 mg | INTRAVENOUS | Status: DC | PRN

## 2015-09-25 MED ORDER — PANTOPRAZOLE SODIUM 40 MG IV SOLR: 40.0000 mg | Freq: Two times a day (BID) | INTRAVENOUS | Status: DC

## 2015-09-25 MED ORDER — DIGOXIN 0.25 MG/ML IJ SOLN
0.0500 mg | Freq: Every day | INTRAMUSCULAR | Status: DC
  Administered 2015-09-25: 0.05 mg via INTRAVENOUS
  Filled 2015-09-25: qty 0.5
  Filled 2015-09-25: qty 0.2

## 2015-09-25 MED ORDER — ENOXAPARIN SODIUM 60 MG/0.6ML ~~LOC~~ SOLN
60.0000 mg | Freq: Every day | SUBCUTANEOUS | Status: DC
Start: 2015-09-25 — End: 2015-09-26
  Administered 2015-09-25: 60 mg via SUBCUTANEOUS
  Filled 2015-09-25: qty 0.6

## 2015-09-25 MED ORDER — VERAPAMIL HCL ER 120 MG PO TBCR
120.0000 mg | EXTENDED_RELEASE_TABLET | Freq: Every day | ORAL | Status: DC
  Administered 2015-09-27 – 2015-09-30 (×4): 120 mg via ORAL
  Filled 2015-09-25 (×6): qty 1

## 2015-09-25 MED ORDER — POLYETHYLENE GLYCOL 3350 17 G PO PACK
17.0000 g | PACK | Freq: Every day | ORAL | Status: DC
  Administered 2015-09-25 – 2015-09-30 (×6): 17 g via ORAL
  Filled 2015-09-25 (×6): qty 1

## 2015-09-25 NOTE — Progress Notes (Signed)
PROGRESS NOTE    [name]  [id number] DOB: 02/27/1923 DOA: 09/24/2015 PCP: [name], MD    Brief Narrative:  Small bowel obstruction, 82 yo female with recent colectomy with colostomy due to diverticulitis. Presented with hypokalemia and dehydration. Responding well to conservative care. Surgery on board.   Assessment & Plan:   Principal Problem:   AKI (acute kidney injury) ([place]) Active Problems:   Pericardial effusion, Large   Chronic atrial fibrillation (HCC)   Essential hypertension, benign   Protein-calorie malnutrition, moderate (HCC)   Chronic diastolic congestive heart failure (HCC)   Normocytic anemia   Hypokalemia   SBO (small bowel obstruction) ([place])   1. Small bowel obstruction. Will continue supportive care, positive stool in the colostomy bag, patient did not tolerate NG tube. Will continue NPO, follow abdominal films and surgery recommendations. Continue IV fluids and antiemetics. Add antiacid therapy with pantoprazole.   2. AKI with Hypokalemia. K at 3,9 and CL at 96. Renal function with cr at 1, 56 from 1,68,. Will continue gentle hydration with saline at 75 cc/hr. Follow renal panel in am, avoid hypotension or nephrotoxic agents. Continue to hold on metolazone and torsemide. Check Mg in am.   3. Chronic atrial fibrillation. Will continue rate control with digoxin, will change to po, and resume verapamil, continue as needed metoprolol IV. Holding warfarin for now. INR still therapeutic at 2. Heart rate 70 to 80.  4. HTN. Blood pressure systolic 90 to [id number], will continue to hold on diuretics and antihypertensive agents.  6. Chronic diastolic heart failure. Old records personally reviewed echocardiogram from [id number] noted LV systolic function 55 to [id number], LVH pattern. Will decrease IV fluids to 75 cc/hr, will continue to hold on diuretic therapy.   7. Back pain. Continue pain control with IV opiates, out of bed as tolerated, physical therapy  evaluation.   DVT prophylaxis: lovenox Code Status: full  Family Communication: I spoke with patient's family at the bedside and all questions were addressed. Disposition Plan: Home  Consultants:   [place]  Procedures:   Antimicrobials:  Subjective: Patient abdominal pain improved with hydromorphone IV, no nausea or vomiting. Positive stool on colostomy bag. Pain dull in nature abdominal and lower back, improved with pain medications, no worsening factors, no radiation.  Objective: Vitals:   09/24/15 2148 09/24/15 2159 09/24/15 2314 09/25/15 0612  BP: 122/57 122/57 91/60 (!) 103/45  Pulse: 69 74 88 74  Resp:  18 18 18   Temp:   98 F (36.7 C) 98.8 F (37.1 C)  TempSrc:   Oral Oral  SpO2: (!) 87% 96% 97% 96%  Weight:   67.1 kg (147 lb 14.9 oz)     Intake/Output Summary (Last 24 hours) at 09/25/15 0841 Last data filed at 09/25/15 0600  Gross per 24 hour  Intake             1100 ml  Output             1000 ml  Net              100 ml   Filed Weights   09/24/15 2314  Weight: 67.1 kg (147 lb 14.9 oz)    Examination:  General exam: deconditioned and ill looking appearing. E ENT: mild conjunctival pallor, oral mucosa dry. Respiratory system: Clear to auscultation. Respiratory effort normal.Mild decreased breath sounds at bases due to poor inspiratory effort. Cardiovascular system: S1 & S2 heard, irregulary irregular. No JVD, murmurs, rubs, gallops or clicks. Positive  edema  +++., bilateral  (mild)pitting.  Gastrointestinal system: Abdomen is  mild distended, soft and nontender. No organomegaly or masses felt. Decreased bowel sounds. Positive stool in the colostomy bag. Central nervous system: Alert and oriented. No focal neurological deficits. Extremities: Symmetric 5 x 5 power. Skin: No rashes, lesions or ulcers Psychiatry: Judgement and insight appear normal. Mood & affect appropriate.     Data Reviewed: I have personally reviewed following labs and imaging  studies  CBC:  Recent Labs Lab 09/18/15 1121 09/24/15 1839 09/25/15 0511  WBC 4.2* 16.0* 11.7*  NEUTROABS  --  13.4*  --   HGB 9.2* 10.1* 9.7*  HCT 28.0* 33.5* 31.8*  MCV 70.5* 74.0* 73.3*  PLT  --  358 [id number]   Basic Metabolic Panel:  Recent Labs Lab 09/18/15 1111 09/24/15 1203 09/24/15 1839 09/24/15 2030 09/25/15 0511  NA 138 139 139  --  142  K 2.8* 4.0 3.1*  --  3.9  CL 90* 95* 93*  --  96*  CO2 38* 33* 35*  --  37*  GLUCOSE 115* 136* 137*  --  132*  BUN 38* 41* 47*  --  48*  CREATININE 1.35* 1.61* 1.68*  --  1.56*  CALCIUM 9.4 9.3 9.5  --  8.9  MG 2.2  --   --  2.4  --    GFR: Estimated Creatinine Clearance: 22.4 mL/min (by C-G formula based on SCr of 1.56 mg/dL). Liver Function Tests:  Recent Labs Lab 09/24/15 1839  AST 34  ALT 24  ALKPHOS 102  BILITOT 0.6  PROT 8.7*  ALBUMIN 4.2    Recent Labs Lab 09/24/15 1839  LIPASE 53*   No results for input(s): AMMONIA in the last 168 hours. Coagulation Profile:  Recent Labs Lab 09/24/15 1839 09/25/15 0511  INR 2.21 2.05   Cardiac Enzymes: No results for input(s): CKTOTAL, CKMB, CKMBINDEX, TROPONINI in the last 168 hours. BNP (last 3 results) No results for input(s): PROBNP in the last 8760 hours. HbA1C: No results for input(s): HGBA1C in the last 72 hours. CBG: No results for input(s): GLUCAP in the last 168 hours. Lipid Profile: No results for input(s): CHOL, HDL, LDLCALC, TRIG, CHOLHDL, LDLDIRECT in the last 72 hours. Thyroid Function Tests: No results for input(s): TSH, T4TOTAL, FREET4, T3FREE, THYROIDAB in the last 72 hours. Anemia Panel: No results for input(s): VITAMINB12, FOLATE, FERRITIN, TIBC, IRON, RETICCTPCT in the last 72 hours. Sepsis Labs:  Recent Labs Lab 09/24/15 1856  LATICACIDVEN 1.66    No results found for this or any previous visit (from the past 240 hour(s)).       Radiology Studies: Ct Renal Stone Study  Result Date: 09/24/2015 CLINICAL DATA:  Acute on  chronic back pain. Left flank pain. Leukocytosis. EXAM: CT ABDOMEN AND PELVIS WITHOUT CONTRAST TECHNIQUE: Multidetector CT imaging of the abdomen and pelvis was performed following the standard protocol without IV contrast. COMPARISON:  09/23/2015 FINDINGS: Lower chest: Large and possibly loculated pericardial effusion, similar to prior. Extensive coronary atherosclerosis. Calcified mitral valve. Pacer leads. Moderate cardiomegaly. Mild atelectasis in the right middle lobe and lingula. Mild atelectasis in both lower lobes. Hepatobiliary: Cholecystectomy Pancreas: Unremarkable Spleen: Unremarkable Adrenals/Urinary Tract: Abnormal appearance the kidneys with cortical contrast remaining on board, but some clearing of the medullary contrast. As far as I am aware the patient received contrast 33 hours prior to today's scan, the persistent contrast in the cortex is indicative of renal dysfunction. There is some excreted contrast in the collecting systems in  urinary bladder is well. Scarring in the kidneys, right greater than left. Bilateral renal cysts. Stomach/Bowel: Small bowel obstruction due to a peristomal hernia of small bowel, about the junction of the jejunum and ileum, extending above the descending colostomy site. Small bowel loops up to 4.1 cm. Distended stomach. Appendix normal. Rectal stump unremarkable. Vascular/Lymphatic: Aortoiliac atherosclerotic vascular disease. Scattered small retroperitoneal lymph nodes. Reproductive: Unremarkable Other: Diffuse subcutaneous and mesenteric edema, query generalized third spacing of fluid. Edema is most notable along the lower anterior pelvic wall and pubis region. Musculoskeletal: Lumbar spondylosis and degenerative disc disease. IMPRESSION: 1. Small bowel obstruction due to a peristomal herniation of small bowel just above the descending colostomy. Small bowel dilated up to 4.1 cm in the stomach is distended. 2. Abnormal appearance the kidneys, with retained contrast  medium in the renal cortex bilaterally, cannot exclude acute renal failure or contrast nephropathy causing this appearance. The patient last received IV contrast about 33 hours prior to today's scan, as best I know. 3. Stable large and possibly loculated pericardial effusion with extensive coronary atherosclerosis, calcified mitral valve, and moderate cardiomegaly. 4.  Aortoiliac atherosclerotic vascular disease. 5. Third spacing of fluid with generalized mesenteric and subcutaneous edema. 6. I do not see any renal stones but sensitivity for nonobstructive stones is low due to the presence of contrast medium within the urinary tract left over from yesterday. Electronically Signed   By: Van Clines M.D.   On: 09/24/2015 20:14        Scheduled Meds: . digoxin  0.05 mg Intravenous Daily  . metoprolol  5 mg Intravenous Q6H  . potassium chloride  20 mEq Oral Daily   Continuous Infusions: . 0.9 % NaCl with KCl 20 mEq / L 100 mL/hr at 09/25/15 0600     LOS: 1 day        Mauricio Gerome Apley, MD [facility] Pager [phone]  If 7PM-7AM, please contact night-coverage [email or web address] Password [email or web address] 09/25/2015, 8:41 AM

## 2015-09-25 NOTE — Consult Note (Signed)
[place] Nurse ostomy consult note Stoma type/location: LLQ Colostomy. Patient expresses relief that her blockage has passed.  She admits to having a large chopped BBQ plate and not drinking many fluids in the time preceding her admission and onset of abdominal pain. Stomal assessment/size: 1inch oval Peristomal assessment: intact Treatment options for stomal/peristomal skin: none indicated, occasionally patient uses a skin barrier ring, but not routinely. Output: Brown stool, softening "balls" of stool Ostomy pouching: 1pc.drainable, flat pouch with integrated gas filter and Lock and Roll closure Education provided: Patient and family (daughter and son in law) provided with contact information in the event her has further questions regarding her ostomy. Patient taught about the importance of chewing her food thoroughly and following meals with plenty of fluids in addition to drinking fluids throughout the day.  She indicates understanding. [place] nursing team will not follow, but will remain available to this patient, the nursing and medical teams.  Please re-consult if needed. Thanks, Maudie Flakes, MSN, RN, [place], Arther Abbott  Pager# [phone]

## 2015-09-25 NOTE — Progress Notes (Signed)
[place] for Lovenox bridge (while off Warfarin) Indication: atrial fibrillation   Allergies  Allergen Reactions  . Anti-Inflammatory Enzyme [Nutritional Supplements] Other (See Comments)    Retains fluids and headaches  . Iodinated Diagnostic Agents Hives    HIVES 15MIN S/P IV CONTRAST INJECTION,WILL NEED 13 HR PREP FOR FUTURE INJECTIONS, ok s/p 50mg  po benadryl//a.calhoun  . Spironolactone Other (See Comments)    Hair loss  . Arthrotec [Diclofenac-Misoprostol] Other (See Comments)    unknown  . Biaxin [Clarithromycin] Other (See Comments)    unknown  . Plavix [Clopidogrel Bisulfate] Other (See Comments)    unknown    Patient Measurements: Height: 5\' 4"  (162.6 cm) Weight: 147 lb 14.9 oz (67.1 kg) IBW/kg (Calculated) : 54.7   Vital Signs: Temp: 98.4 F (36.9 C) (08/30 1514) Temp Source: Oral (08/30 1514) BP: 106/55 (08/30 1514) Pulse Rate: 67 (08/30 1514)  Labs:  Recent Labs  09/24/15 1203 09/24/15 1839 09/25/15 0511 09/25/15 2125  HGB  --  10.1* 9.7*  --   HCT  --  33.5* 31.8*  --   PLT  --  358 289  --   LABPROT  --  24.9* 23.4* 22.0*  INR  --  2.21 2.05 1.89  CREATININE 1.61* 1.68* 1.56*  --     Estimated Creatinine Clearance: 22.6 mL/min (by C-G formula based on SCr of 1.56 mg/dL).   Medical History: Past Medical History:  Diagnosis Date  . Anemia   . Arthritis   . Chronic atrial fibrillation (HCC)    a. on Coumadin, Digoxin, and Verapamil  . Chronic diastolic (congestive) heart failure ([place])    a. Echo 01/2015 with EF of 55-60%.  . History of GI bleed    a. secondary to AVM's. Treated with Fe infusion  . History of shingles   . HTN (hypertension)   . LVH (left ventricular hypertrophy)   . Obesity   . Pericardial effusion    a. s/p pericardial window 01/16/15  . Positive TB test   . Sleep apnea    mild-no cpap  . Stroke [facility])    a. 2013: right frontal  . Tachycardia-bradycardia syndrome ([place])    a.  s/p Medtronic [place], model number [id number], serial number [id number] H 12/2013    Medications:  Scheduled:  . [START ON 09/26/2015] digoxin  0.0625 mg Oral Daily  . [START ON 09/28/2015] pantoprazole  40 mg Intravenous Q12H  . polyethylene glycol  17 g Oral Daily  . verapamil  120 mg Oral Daily   Infusions:  . dextrose 5 % and 0.9% NaCl 75 mL/hr at 09/25/15 0934    Assessment: 89 yoF on chronic warfarin for A-fib.  INR therapeutic on admission.  She is admitted with partial SBO.  Pharmacy consulted to dose Lovenox while warfarin on hold.   09/25/2015:  INR now <2  SBO resolved per xray today, cl liquid diet started  Goal of Therapy:  Anti-Xa level 0.6-1 units/ml 4hrs after LMWH dose given   Plan:  Start Lovenox 60mg  sq qhs  Monitor CBC, s/sx of bleeding F/U plans to resume Coumadin   Netta Cedars, [name], BCPS Pager: [phone] 09/25/2015,9:52 PM

## 2015-09-25 NOTE — Progress Notes (Signed)
Clarification from Dr. Hassell Done received regarding NG tube order. Since pt is resting comfortably and does not wish to have an NG tube placed we will wait and see if there are any more episodes of vomiting. If so, nursing will place NG tube.

## 2015-09-25 NOTE — Evaluation (Signed)
Physical Therapy Evaluation Patient Details Name: [name] MRN: [id number] DOB: 02-18-1923 Today's Date: 09/25/2015   History of Present Illness  [name] is a 82 y.o. female with a past medical history significant for Afib on warfarin with pacer, chronic diastolic CHF, chronic pericardial effusion s/p window in 2016, and recent recurrent diverticulitis s/p colostomy in May 2017 who presents with back pain and vomiting and AKI. S/P . Found to have partial bowel obstruction.  Clinical Impression  The  Patient is c/o increased pain of the  Back. Requested premedication prior to ambulation. Plans to return to  Home of sister with caregivers. Pt admitted with above diagnosis. Pt currently with functional limitations due to the deficits listed below (see PT Problem List). Pt will benefit from skilled PT to increase their independence and safety with mobility to allow discharge to the venue listed below.       Follow Up Recommendations Home health PT;Supervision/Assistance - 24 hour (if agrees)    Equipment Recommendations  None recommended by PT    Recommendations for Other Services       Precautions / Restrictions Precautions Precautions: Fall Precaution Comments: pain in back      Mobility  Bed Mobility Overal bed mobility: Modified Independent                Transfers Overall transfer level: Needs assistance Equipment used: None Transfers: Sit to/from [facility] Sit to Stand: Mod assist Stand pivot transfers: Mod assist       General transfer comment: from bed to [facility] and back, pain initiated in shoulder so did not ambulate  Ambulation/Gait                Stairs            Wheelchair Mobility    Modified Rankin (Stroke Patients Only)       Balance                                             Pertinent Vitals/Pain Pain Assessment: 0-10 Pain Score: 5  Pain Location: R shoulder    Home  Living Family/patient expects to be discharged to:: Private residence Living Arrangements: Other relatives Available Help at Discharge: Family;Friend(s);Personal care attendant Type of Home: House Home Access: Ramped entrance       Home Equipment: [place] - 2 wheels Additional Comments: patient stays with sister who is paralyzed, has caregivers 7 hours/5 days a week    Prior Function Level of Independence: Needs assistance      ADL's / Homemaking Assistance Needed: assist with bath        Hand Dominance   Dominant Hand: Right    Extremity/Trunk Assessment               Lower Extremity Assessment: Generalized weakness      Cervical / Trunk Assessment: Kyphotic  Communication   Communication: HOH  Cognition Arousal/Alertness: Awake/alert Behavior During Therapy: WFL for tasks assessed/performed Overall Cognitive Status: Within Functional Limits for tasks assessed                      General Comments      Exercises        Assessment/Plan    PT Assessment Patient needs continued PT services  PT Diagnosis Difficulty walking   PT Problem List Decreased strength;Decreased activity  tolerance;Decreased range of motion;Decreased mobility;Pain  PT Treatment Interventions DME instruction;Gait training;Functional mobility training;Therapeutic activities;Therapeutic exercise;Patient/family education   PT Goals (Current goals can be found in the Care Plan section) Acute Rehab PT Goals Patient Stated Goal: to go home PT Goal Formulation: With patient Time For Goal Achievement: 10/09/15 Potential to Achieve Goals: Good    Frequency Min 3X/week   Barriers to discharge        Co-evaluation               End of Session   Activity Tolerance: Patient limited by pain Patient left: in bed;with call bell/phone within reach;with bed alarm set;with nursing/sitter in room Nurse Communication: Mobility status         Time: [id number] PT Time  Calculation (min) (ACUTE ONLY): 26 min   Charges:   PT Evaluation $PT Eval Low Complexity: 1 Procedure PT Treatments $Therapeutic Activity: 8-22 mins   PT G Codes:        Claretha Cooper 09/25/2015, 3:26 PM Tresa Endo PT [phone]

## 2015-09-25 NOTE — Progress Notes (Signed)
ANTICOAGULATION CONSULT NOTE - Initial Consult  Pharmacy Consult for Lovenox while off Warfarin Indication: atrial fibrillation   Allergies  Allergen Reactions  . Anti-Inflammatory Enzyme [Nutritional Supplements] Other (See Comments)    Retains fluids and headaches  . Iodinated Diagnostic Agents Hives    HIVES 15MIN S/P IV CONTRAST INJECTION,WILL NEED 13 HR PREP FOR FUTURE INJECTIONS, ok s/p 50mg  po benadryl//a.calhoun  . Spironolactone Other (See Comments)    Hair loss  . Arthrotec [Diclofenac-Misoprostol] Other (See Comments)    unknown  . Biaxin [Clarithromycin] Other (See Comments)    unknown  . Plavix [Clopidogrel Bisulfate] Other (See Comments)    unknown    Patient Measurements: Weight: 147 lb 14.9 oz (67.1 kg)   Vital Signs: Temp: 98 F (36.7 C) (08/29 2314) Temp Source: Oral (08/29 2314) BP: 91/60 (08/29 2314) Pulse Rate: 88 (08/29 2314)  Labs:  Recent Labs  09/24/15 1203 09/24/15 1839  HGB  --  10.1*  HCT  --  33.5*  PLT  --  358  LABPROT  --  24.9*  INR  --  2.21  CREATININE 1.61* 1.68*    Estimated Creatinine Clearance: 20.8 mL/min (by C-G formula based on SCr of 1.68 mg/dL).   Medical History: Past Medical History:  Diagnosis Date  . Anemia   . Arthritis   . Chronic atrial fibrillation (HCC)    a. on Coumadin, Digoxin, and Verapamil  . Chronic diastolic (congestive) heart failure ([place])    a. Echo 01/2015 with EF of 55-60%.  . History of GI bleed    a. secondary to AVM's. Treated with Fe infusion  . History of shingles   . HTN (hypertension)   . LVH (left ventricular hypertrophy)   . Obesity   . Pericardial effusion    a. s/p pericardial window 01/16/15  . Positive TB test   . Sleep apnea    mild-no cpap  . Stroke [facility])    a. 2013: right frontal  . Tachycardia-bradycardia syndrome ([place])    a. s/p Medtronic [place], model number [id number], serial number [id number] H 12/2013    Medications:  Scheduled:  . metoprolol  5 mg Intravenous  Q6H  . potassium chloride  20 mEq Oral Daily   Infusions:  . 0.9 % NaCl with KCl 20 mEq / L 100 mL/hr at 09/24/15 2100    Assessment: 57 yoF c/o n/v on chronic warfarin for A-fib found to have partial SBO.  Lovenox per Rx for A-fib while off warfarin. INR=2.21 on admission , 0500 INR=2.05.   Goal of Therapy:  Anti-Xa level 0.6-1 units/ml 4hrs after LMWH dose given    Plan:   Will recheck PT/INR at 2100  Will start Lovenox if [place] Phy, Jermarion Poffenberger R 09/25/2015,2:09 AM

## 2015-09-25 NOTE — Progress Notes (Signed)
Patient ID: [name], female   DOB: 03/14/1923, 82 y.o.   MRN: [id number]  [facility] Surgery Progress Note     Subjective: Feeling better this morning. Denies any current abdominal pain. Reports stool passing into ostomy bag. Denies any flatus or n/v this morning.  Objective: Vital signs in last 24 hours: Temp:  [97.5 F (36.4 C)-98.8 F (37.1 C)] 98.8 F (37.1 C) (08/30 0612) Pulse Rate:  [59-88] 74 (08/30 0612) Resp:  [16-18] 18 (08/30 0612) BP: (91-123)/(45-65) 103/45 (08/30 0612) SpO2:  [87 %-97 %] 96 % (08/30 0612) Weight:  [147 lb 14.9 oz (67.1 kg)] 147 lb 14.9 oz (67.1 kg) (08/29 2314) Last BM Date: 09/25/15  Intake/Output from previous day: 08/29 0701 - 08/30 0700 In: 1100 [I.V.:1100] Out: 1000 [Urine:1000] Intake/Output this shift: No intake/output data recorded.  PE: Gen:  Alert, NAD, pleasant Card:  Irregularly irregular, regular rate Pulm:  CTAB Abd: Soft, minimally distended, NT, +BS, stool noted in ostomy bag  Lab Results:   Recent Labs  09/24/15 1839 09/25/15 0511  WBC 16.0* 11.7*  HGB 10.1* 9.7*  HCT 33.5* 31.8*  PLT 358 289   BMET  Recent Labs  09/24/15 1839 09/25/15 0511  NA 139 142  K 3.1* 3.9  CL 93* 96*  CO2 35* 37*  GLUCOSE 137* 132*  BUN 47* 48*  CREATININE 1.68* 1.56*  CALCIUM 9.5 8.9   PT/INR  Recent Labs  09/24/15 1839 09/25/15 0511  LABPROT 24.9* 23.4*  INR 2.21 2.05   CMP     Component Value Date/Time   NA 142 09/25/2015 0511   K 3.9 09/25/2015 0511   CL 96 (L) 09/25/2015 0511   CO2 37 (H) 09/25/2015 0511   GLUCOSE 132 (H) 09/25/2015 0511   BUN 48 (H) 09/25/2015 0511   CREATININE 1.56 (H) 09/25/2015 0511   CREATININE 1.61 (H) 09/24/2015 1203   CALCIUM 8.9 09/25/2015 0511   PROT 8.7 (H) 09/24/2015 1839   ALBUMIN 4.2 09/24/2015 1839   AST 34 09/24/2015 1839   ALT 24 09/24/2015 1839   ALKPHOS 102 09/24/2015 1839   BILITOT 0.6 09/24/2015 1839   GFRNONAA 28 (L) 09/25/2015 0511   GFRNONAA 35  (L) 09/18/2015 1111   GFRAA 33 (L) 09/25/2015 0511   GFRAA 40 (L) 09/18/2015 1111   Lipase     Component Value Date/Time   LIPASE 53 (H) 09/24/2015 1839       Studies/Results: Ct Renal Stone Study  Result Date: 09/24/2015 CLINICAL DATA:  Acute on chronic back pain. Left flank pain. Leukocytosis. EXAM: CT ABDOMEN AND PELVIS WITHOUT CONTRAST TECHNIQUE: Multidetector CT imaging of the abdomen and pelvis was performed following the standard protocol without IV contrast. COMPARISON:  09/23/2015 FINDINGS: Lower chest: Large and possibly loculated pericardial effusion, similar to prior. Extensive coronary atherosclerosis. Calcified mitral valve. Pacer leads. Moderate cardiomegaly. Mild atelectasis in the right middle lobe and lingula. Mild atelectasis in both lower lobes. Hepatobiliary: Cholecystectomy Pancreas: Unremarkable Spleen: Unremarkable Adrenals/Urinary Tract: Abnormal appearance the kidneys with cortical contrast remaining on board, but some clearing of the medullary contrast. As far as I am aware the patient received contrast 33 hours prior to today's scan, the persistent contrast in the cortex is indicative of renal dysfunction. There is some excreted contrast in the collecting systems in urinary bladder is well. Scarring in the kidneys, right greater than left. Bilateral renal cysts. Stomach/Bowel: Small bowel obstruction due to a peristomal hernia of small bowel, about the junction of the jejunum and  ileum, extending above the descending colostomy site. Small bowel loops up to 4.1 cm. Distended stomach. Appendix normal. Rectal stump unremarkable. Vascular/Lymphatic: Aortoiliac atherosclerotic vascular disease. Scattered small retroperitoneal lymph nodes. Reproductive: Unremarkable Other: Diffuse subcutaneous and mesenteric edema, query generalized third spacing of fluid. Edema is most notable along the lower anterior pelvic wall and pubis region. Musculoskeletal: Lumbar spondylosis and  degenerative disc disease. IMPRESSION: 1. Small bowel obstruction due to a peristomal herniation of small bowel just above the descending colostomy. Small bowel dilated up to 4.1 cm in the stomach is distended. 2. Abnormal appearance the kidneys, with retained contrast medium in the renal cortex bilaterally, cannot exclude acute renal failure or contrast nephropathy causing this appearance. The patient last received IV contrast about 33 hours prior to today's scan, as best I know. 3. Stable large and possibly loculated pericardial effusion with extensive coronary atherosclerosis, calcified mitral valve, and moderate cardiomegaly. 4.  Aortoiliac atherosclerotic vascular disease. 5. Third spacing of fluid with generalized mesenteric and subcutaneous edema. 6. I do not see any renal stones but sensitivity for nonobstructive stones is low due to the presence of contrast medium within the urinary tract left over from yesterday. Electronically Signed   By: Van Clines M.D.   On: 09/24/2015 20:14    Anti-infectives: Anti-infectives    None       Assessment/Plan Partial SBO - small bowel protocol - Xray this a.m. Showed resolution of small bowel obstruction - no NG at this time, no n/v S/p sigmoid colectomy for perforated diverticulitis with abscess 05/31/15 Dr. Max Sane hernia - WOC consulted Leukocytosis - trending down, 11.7 today from 16.0 AKI with hypokalemia - K repleted 3.9 today, BUN/Cr still elevated. Recheck tomorrow. Atrial fibrillation - on coumadin at home HTN  dHF   ID - none FEN - full liquids VTE - SCD's, lovenox Plan - stool in ostomy bag and XR showed resolution of SBO. Start full liquids. Will treat constipation with miralax. Encourage ambulation, PT consulted.   LOS: 1 day    Jerrye Beavers , [facility] 09/25/2015, 9:37 AM Pager: [phone] Consults: [phone] Mon-Fri 7:00 am-4:30 pm Sat-Sun 7:00 am-11:30 am

## 2015-09-26 LAB — PROTIME-INR
INR: 1.97
Prothrombin Time: 22.7 seconds — ABNORMAL HIGH (ref 11.4–15.2)

## 2015-09-26 LAB — CBC WITH DIFFERENTIAL/PLATELET
BASOS PCT: 0 %
Basophils Absolute: 0 10*3/uL (ref 0.0–0.1)
EOS PCT: 2 %
Eosinophils Absolute: 0.1 10*3/uL (ref 0.0–0.7)
HCT: 27.1 % — ABNORMAL LOW (ref 36.0–46.0)
HEMOGLOBIN: 8.1 g/dL — AB (ref 12.0–15.0)
LYMPHS PCT: 18 %
Lymphs Abs: 1 10*3/uL (ref 0.7–4.0)
MCH: 21.9 pg — AB (ref 26.0–34.0)
MCHC: 29.9 g/dL — ABNORMAL LOW (ref 30.0–36.0)
MCV: 73.2 fL — AB (ref 78.0–100.0)
Monocytes Absolute: 0.6 10*3/uL (ref 0.1–1.0)
Monocytes Relative: 11 %
NEUTROS ABS: 4.1 10*3/uL (ref 1.7–7.7)
NEUTROS PCT: 69 %
Platelets: 211 10*3/uL (ref 150–400)
RBC: 3.7 MIL/uL — ABNORMAL LOW (ref 3.87–5.11)
RDW: 15.7 % — ABNORMAL HIGH (ref 11.5–15.5)
WBC: 5.8 10*3/uL (ref 4.0–10.5)

## 2015-09-26 LAB — BASIC METABOLIC PANEL
ANION GAP: 5 (ref 5–15)
BUN: 38 mg/dL — ABNORMAL HIGH (ref 6–20)
CHLORIDE: 100 mmol/L — AB (ref 101–111)
CO2: 34 mmol/L — AB (ref 22–32)
CREATININE: 1.41 mg/dL — AB (ref 0.44–1.00)
Calcium: 8.2 mg/dL — ABNORMAL LOW (ref 8.9–10.3)
GFR calc Af Amer: 37 mL/min — ABNORMAL LOW (ref >60)
GFR calc non Af Amer: 32 mL/min — ABNORMAL LOW (ref >60)
GLUCOSE: 113 mg/dL — AB (ref 65–99)
Potassium: 2.8 mmol/L — ABNORMAL LOW (ref 3.5–5.1)
Sodium: 139 mmol/L (ref 135–145)

## 2015-09-26 LAB — MAGNESIUM: Magnesium: 2 mg/dL (ref 1.7–2.4)

## 2015-09-26 LAB — POTASSIUM: POTASSIUM: 3.2 mmol/L — AB (ref 3.5–5.1)

## 2015-09-26 LAB — UREA NITROGEN, URINE: UREA NITROGEN UR: 226 mg/dL

## 2015-09-26 MED ORDER — WARFARIN SODIUM 5 MG PO TABS: 5.0000 mg | ORAL_TABLET | Freq: Every day | ORAL | Status: DC

## 2015-09-26 MED ORDER — WARFARIN SODIUM 5 MG PO TABS
5.0000 mg | ORAL_TABLET | Freq: Once | ORAL | Status: AC
  Administered 2015-09-26: 5 mg via ORAL
  Filled 2015-09-26: qty 1

## 2015-09-26 MED ORDER — CYCLOBENZAPRINE HCL 5 MG PO TABS
5.0000 mg | ORAL_TABLET | Freq: Three times a day (TID) | ORAL | Status: DC | PRN
  Administered 2015-09-26 – 2015-09-30 (×4): 5 mg via ORAL
  Filled 2015-09-26 (×5): qty 1

## 2015-09-26 MED ORDER — POTASSIUM CHLORIDE 10 MEQ/100ML IV SOLN
10.0000 meq | INTRAVENOUS | Status: AC
  Administered 2015-09-26 (×4): 10 meq via INTRAVENOUS
  Filled 2015-09-26 (×4): qty 100

## 2015-09-26 MED ORDER — PRO-STAT SUGAR FREE PO LIQD
30.0000 mL | Freq: Two times a day (BID) | ORAL | Status: DC
  Administered 2015-09-27: 30 mL via ORAL
  Filled 2015-09-26 (×6): qty 30

## 2015-09-26 MED ORDER — WARFARIN - PHARMACIST DOSING INPATIENT: Freq: Every day | Status: DC

## 2015-09-26 MED ORDER — POTASSIUM CHLORIDE 10 MEQ/100ML IV SOLN
10.0000 meq | INTRAVENOUS | Status: AC
  Administered 2015-09-26 (×2): 10 meq via INTRAVENOUS
  Filled 2015-09-26 (×2): qty 100

## 2015-09-26 NOTE — Consult Note (Signed)
   [facility] CM Inpatient Consult   09/26/2015  SHANIKIA BADERTSCHER Jan 24, 1924 [id number]   Patient screened for [facility] Care Management services. Went to bedside to discuss [place] Management program. However, she was sleeping soundly. Did not awake to name being called. Will come back at a later time.     Marthenia Rolling, MSN-Ed, RN,BSN [facility] Liaison [phone]

## 2015-09-26 NOTE — Progress Notes (Signed)
Patient's O2 Sats 85-88% RA after waking up from nap. Denied SOB or difficulty breathing. 2L/[place] applied and O2 Sats increased to 94%. MD made aware- ordered to D/C IVF. Will continue to monitor.

## 2015-09-26 NOTE — Consult Note (Signed)
[place] Nurse ostomy follow up Patient seen yesterday and new order received today to offer suggestions to patient regarding constipation.  Yesterday's conversation regarding increasing fluids and movement are dismissed, patient has seen dietician today and also notes that she is now on Miralax, a bulking agent with may impact stool consistency.  She is advised that she may also cut aperture slightly larger to accommodate firmer stools. Patient is amenable to seeing how things "go" in the days ahead.  [place] nursing team will not follow, but will remain available to this patient, the nursing and medical teams.  Please re-consult if needed. Thanks, Maudie Flakes, MSN, RN, [place], Arther Abbott  Pager# [phone]

## 2015-09-26 NOTE — Progress Notes (Signed)
ANTICOAGULATION CONSULT NOTE - Initial Consult  Pharmacy Consult for warfarin Indication: atrial fibrillation  Allergies  Allergen Reactions  . Anti-Inflammatory Enzyme [Nutritional Supplements] Other (See Comments)    Retains fluids and headaches  . Iodinated Diagnostic Agents Hives    HIVES 15MIN S/P IV CONTRAST INJECTION,WILL NEED 13 HR PREP FOR FUTURE INJECTIONS, ok s/p 50mg  po benadryl//a.[place]  . Spironolactone Other (See Comments)    Hair loss  . Arthrotec [Diclofenac-Misoprostol] Other (See Comments)    unknown  . Biaxin [Clarithromycin] Other (See Comments)    unknown  . Plavix [Clopidogrel Bisulfate] Other (See Comments)    unknown    Patient Measurements: Height: 5\' 4"  (162.6 cm) Weight: 154 lb 15.7 oz (70.3 kg) IBW/kg (Calculated) : 54.7   Vital Signs: Temp: 98.7 F (37.1 C) (08/31 0607) Temp Source: Oral (08/31 0607) BP: 119/56 (08/31 0923) Pulse Rate: 80 (08/31 0923)  Labs:  Recent Labs  09/24/15 1839 09/25/15 0511 09/25/15 2125 09/26/15 0459  HGB 10.1* 9.7*  --  8.1*  HCT 33.5* 31.8*  --  27.1*  PLT 358 289  --  211  LABPROT 24.9* 23.4* 22.0* 22.7*  INR 2.21 2.05 1.89 1.97  CREATININE 1.68* 1.56*  --  1.41*    Estimated Creatinine Clearance: 25.5 mL/min (by C-G formula based on SCr of 1.41 mg/dL).   Medical History: Past Medical History:  Diagnosis Date  . Anemia   . Arthritis   . Chronic atrial fibrillation (HCC)    a. on Coumadin, Digoxin, and Verapamil  . Chronic diastolic (congestive) heart failure ([place])    a. Echo 01/2015 with EF of 55-60%.  . History of GI bleed    a. secondary to AVM's. Treated with Fe infusion  . History of shingles   . HTN (hypertension)   . LVH (left ventricular hypertrophy)   . Obesity   . Pericardial effusion    a. s/p pericardial window 01/16/15  . Positive TB test   . Sleep apnea    mild-no cpap  . Stroke [facility])    a. 2013: right frontal  . Tachycardia-bradycardia syndrome ([place])    a. s/p  Medtronic [place], model number [id number], serial number [id number] H 12/2013    Assessment: 29 yoF on chronic warfarin for A-fib.  INR therapeutic on admission.  She is admitted with partial SBO, lovenox started while warfarin on hold.  Pharmacy consulted to resume warfarin.   Home dose warfarin: 2.5mg  on Mon, Wed, Fri and 5mg  on all other days.  Last dose 8/28   09/26/2015 INR 1.97 H/H low plts WNL Scr 1.41, CrCl ~ 71mls/min Soft diet resumed today  Goal of Therapy:  INR 2-3   Plan:  Warfarin 5mg  x1 today Daily INR D/C enoxaparin  Dolly Rias RPh 09/26/2015, 1:04 PM Pager [phone]

## 2015-09-26 NOTE — Progress Notes (Signed)
PROGRESS NOTE    [name]  [id number] DOB: [birth year] DOA: 09/24/2015 PCP: [name], MD    Brief Narrative: Small bowel obstruction, 82 yo female with recent colectomy with colostomy due to diverticulitis. Presented with hypokalemia and dehydration. Responding well to conservative care. Surgery on board. Positive bowel movements. K repletion.   Assessment & Plan:   Principal Problem:   AKI (acute kidney injury) ([place]) Active Problems:   Pericardial effusion, Large   Chronic atrial fibrillation (HCC)   Essential hypertension, benign   Protein-calorie malnutrition, moderate (HCC)   Chronic diastolic congestive heart failure (HCC)   Normocytic anemia   Hypokalemia   SBO (small bowel obstruction) ([place])   Colostomy in place ([place])    1. Small bowel obstruction. Will continue supportive care, positive stool in the colostomy bag, diet has been advanced. No nausea, vomiting or abdominal pain. Abdominal films personally reviewed, no air-fluid levels noted, suggesting resolution of sbo.   2. AKI with Hypokalemia. K at 2,8 and Cl at 100. Will replete K with kcl IV 40 meq and will check K in pm, target level of 4. Renal function with cr at 1, 41 from 1,56. Patient tolerating po well, will hold on IV fluids, no signs of significant volume overload. Follow renal panel in am, avoid hypotension or nephrotoxic agents. Continue to hold on metolazone and torsemide. Magnesium at 2.0  3. Chronic atrial fibrillation. Continue rate control with digoxin, verapamil, continue as needed metoprolol IV. Heart rate persistent between 70 to 80 bpm. Will resume warfarin to target INR 2 to 3,   4. HTN. Blood pressure systolic [id number], will continue to hold on diuretics and antihypertensive agents.  6. Chronic diastolic heart failure. No signs of volume overload, patient tolerating po well, will hold on IV fluids, continue rate control for atrial fibrillation, will continue to hold on diuretics for  now.   7. Back pain. Continue pain control with IV hydromorphone, out of bed as tolerated, physical therapy evaluation. Plan for possible discharge in am.    DVT prophylaxis: warfarin Code Status: full  Family Communication: No family at the bedside Disposition Plan: Home .   Consultants:   Surgery   Procedures:  Antimicrobials:  Subjective: Patient with positive bowel movements, formed. No nausea or vomiting, tolerating po well. Positive edema on lower extremities. No chest pain or dyspnea.   Objective: Vitals:   09/25/15 1200 09/25/15 1514 09/25/15 2154 09/26/15 0607  BP:  (!) 106/55 (!) 114/48 (!) 102/55  Pulse:  67 73 75  Resp:  17 16 16   Temp:  98.4 F (36.9 C) 97.9 F (36.6 C) 98.7 F (37.1 C)  TempSrc:  Oral Oral Oral  SpO2:  93% 90% 90%  Weight:    70.3 kg (154 lb 15.7 oz)  Height: 5\' 4"  (1.626 m)       Intake/Output Summary (Last 24 hours) at 09/26/15 0901 Last data filed at 09/26/15 [id number]  Gross per 24 hour  Intake           1547.5 ml  Output              550 ml  Net            997.5 ml   Filed Weights   09/24/15 2314 09/26/15 0607  Weight: 67.1 kg (147 lb 14.9 oz) 70.3 kg (154 lb 15.7 oz)    Examination:  General exam: Mild deconditioned E ENT: Oral mucosa moist. No conjunctival pallor or icterus.  Respiratory  system: Clear to auscultation. Respiratory effort normal. Mild decreased breath sounds at bases, no wheezing, rales or rhonchi.  Cardiovascular system: S1 & S2 heard, RRR. No JVD, murmurs, rubs, gallops or clicks. Positive lower extremities edema, ++ to +++  Gastrointestinal system: Abdomen is mild distended, soft and nontender. No organomegaly or masses felt. Normal bowel sounds heard. Colostomy bag in place with formed brown stool.  Central nervous system: Alert and oriented. No focal neurological deficits. Extremities: Symmetric 5 x 5 power. Skin: No rashes, lesions or ulcers Psychiatry: Judgement and insight appear normal. Mood &  affect appropriate.     Data Reviewed: I have personally reviewed following labs and imaging studies  CBC:  Recent Labs Lab 09/24/15 1839 09/25/15 0511 09/26/15 0459  WBC 16.0* 11.7* 5.8  NEUTROABS 13.4*  --  4.1  HGB 10.1* 9.7* 8.1*  HCT 33.5* 31.8* 27.1*  MCV 74.0* 73.3* 73.2*  PLT 358 289 [id number]   Basic Metabolic Panel:  Recent Labs Lab 09/24/15 1203 09/24/15 1839 09/24/15 2030 09/25/15 0511 09/26/15 0459  NA 139 139  --  142 139  K 4.0 3.1*  --  3.9 2.8*  CL 95* 93*  --  96* 100*  CO2 33* 35*  --  37* 34*  GLUCOSE 136* 137*  --  132* 113*  BUN 41* 47*  --  48* 38*  CREATININE 1.61* 1.68*  --  1.56* 1.41*  CALCIUM 9.3 9.5  --  8.9 8.2*  MG  --   --  2.4  --  2.0   GFR: Estimated Creatinine Clearance: 25.5 mL/min (by C-G formula based on SCr of 1.41 mg/dL). Liver Function Tests:  Recent Labs Lab 09/24/15 1839  AST 34  ALT 24  ALKPHOS 102  BILITOT 0.6  PROT 8.7*  ALBUMIN 4.2    Recent Labs Lab 09/24/15 1839  LIPASE 53*   No results for input(s): AMMONIA in the last 168 hours. Coagulation Profile:  Recent Labs Lab 09/24/15 09/24/15 1839 09/25/15 0511 09/25/15 2125 09/26/15 0459  INR 2.5 2.21 2.05 1.89 1.97   Cardiac Enzymes: No results for input(s): CKTOTAL, CKMB, CKMBINDEX, TROPONINI in the last 168 hours. BNP (last 3 results) No results for input(s): PROBNP in the last 8760 hours. HbA1C: No results for input(s): HGBA1C in the last 72 hours. CBG: No results for input(s): GLUCAP in the last 168 hours. Lipid Profile: No results for input(s): CHOL, HDL, LDLCALC, TRIG, CHOLHDL, LDLDIRECT in the last 72 hours. Thyroid Function Tests: No results for input(s): TSH, T4TOTAL, FREET4, T3FREE, THYROIDAB in the last 72 hours. Anemia Panel: No results for input(s): VITAMINB12, FOLATE, FERRITIN, TIBC, IRON, RETICCTPCT in the last 72 hours. Sepsis Labs:  Recent Labs Lab 09/24/15 1856  LATICACIDVEN 1.66    No results found for this or any  previous visit (from the past 240 hour(s)).       Radiology Studies: Dg Abd 1 View  Result Date: 09/25/2015 CLINICAL DATA:  Bowel obstruction due to peristomal herniation of small bowel. EXAM: ABDOMEN - 1 VIEW COMPARISON:  CT scans dated 09/24/2015 and radiographs dated 06/11/2015 FINDINGS: The small bowel distention visible on the prior CT scan has resolved. The bowel gas pattern is normal. Colostomy in the left lower quadrant is noted. Aortic atherosclerosis.  Cholecystectomy. IMPRESSION: Resolution of small bowel obstruction. Aortic atherosclerosis. Electronically Signed   By: Lorriane Shire M.D.   On: 09/25/2015 09:39   Ct Renal Stone Study  Result Date: 09/24/2015 CLINICAL DATA:  Acute on chronic back pain.  Left flank pain. Leukocytosis. EXAM: CT ABDOMEN AND PELVIS WITHOUT CONTRAST TECHNIQUE: Multidetector CT imaging of the abdomen and pelvis was performed following the standard protocol without IV contrast. COMPARISON:  09/23/2015 FINDINGS: Lower chest: Large and possibly loculated pericardial effusion, similar to prior. Extensive coronary atherosclerosis. Calcified mitral valve. Pacer leads. Moderate cardiomegaly. Mild atelectasis in the right middle lobe and lingula. Mild atelectasis in both lower lobes. Hepatobiliary: Cholecystectomy Pancreas: Unremarkable Spleen: Unremarkable Adrenals/Urinary Tract: Abnormal appearance the kidneys with cortical contrast remaining on board, but some clearing of the medullary contrast. As far as I am aware the patient received contrast 33 hours prior to today's scan, the persistent contrast in the cortex is indicative of renal dysfunction. There is some excreted contrast in the collecting systems in urinary bladder is well. Scarring in the kidneys, right greater than left. Bilateral renal cysts. Stomach/Bowel: Small bowel obstruction due to a peristomal hernia of small bowel, about the junction of the jejunum and ileum, extending above the descending colostomy  site. Small bowel loops up to 4.1 cm. Distended stomach. Appendix normal. Rectal stump unremarkable. Vascular/Lymphatic: Aortoiliac atherosclerotic vascular disease. Scattered small retroperitoneal lymph nodes. Reproductive: Unremarkable Other: Diffuse subcutaneous and mesenteric edema, query generalized third spacing of fluid. Edema is most notable along the lower anterior pelvic wall and pubis region. Musculoskeletal: Lumbar spondylosis and degenerative disc disease. IMPRESSION: 1. Small bowel obstruction due to a peristomal herniation of small bowel just above the descending colostomy. Small bowel dilated up to 4.1 cm in the stomach is distended. 2. Abnormal appearance the kidneys, with retained contrast medium in the renal cortex bilaterally, cannot exclude acute renal failure or contrast nephropathy causing this appearance. The patient last received IV contrast about 33 hours prior to today's scan, as best I know. 3. Stable large and possibly loculated pericardial effusion with extensive coronary atherosclerosis, calcified mitral valve, and moderate cardiomegaly. 4.  Aortoiliac atherosclerotic vascular disease. 5. Third spacing of fluid with generalized mesenteric and subcutaneous edema. 6. I do not see any renal stones but sensitivity for nonobstructive stones is low due to the presence of contrast medium within the urinary tract left over from yesterday. Electronically Signed   By: Van Clines M.D.   On: 09/24/2015 20:14        Scheduled Meds: . digoxin  0.0625 mg Oral Daily  . enoxaparin (LOVENOX) injection  60 mg Subcutaneous QHS  . [START ON 09/28/2015] pantoprazole  40 mg Intravenous Q12H  . polyethylene glycol  17 g Oral Daily  . potassium chloride  10 mEq Intravenous Q1 Hr x 4  . verapamil  120 mg Oral Daily   Continuous Infusions: . dextrose 5 % and 0.9% NaCl 75 mL/hr at 09/25/15 2218     LOS: 2 days       Tawni Millers, MD [facility] Pager  [phone]  If 7PM-7AM, please contact night-coverage [email or web address] Password [email or web address] 09/26/2015, 9:01 AM

## 2015-09-26 NOTE — Progress Notes (Signed)
Subjective: She is having stool, and it is still formed and solid enough that it is dislodging the bag.  She has a peristomal hernia, but we will leave that alone as long as it is not causing obstruction.  He main issue is constipation.  She needs to stay on a bowel regime that keeps her stools soft.    Objective: Vital signs in last 24 hours: Temp:  [97.9 F (36.6 C)-98.7 F (37.1 C)] 98.7 F (37.1 C) (08/31 0607) Pulse Rate:  [67-80] 80 (08/31 0923) Resp:  [16-18] 18 (08/31 0923) BP: (102-119)/(45-56) 119/56 (08/31 0923) SpO2:  [90 %-93 %] 92 % (08/31 0923) Weight:  [70.3 kg (154 lb 15.7 oz)] 70.3 kg (154 lb 15.7 oz) (08/31 0607) Last BM Date: 09/25/15 No stool reported on I/O PO 240 recorded Urine 550 second shift recorded Afebrile, VSS K+ 2.8, mag 2.0 Creatinine is 1.41 H/H is down with hydration Film yesterday shows resolution of SBO  Intake/Output from previous day: 08/30 0701 - 08/31 0700 In: 1547.5 [P.O.:240; I.V.:1307.5] Out: 550 [Urine:550] Intake/Output this shift: No intake/output data recorded.  General appearance: alert, cooperative and no distress GI: soft nontender, parastomal hernia, ostomy OK.  + BS and BM   Lab Results:   Recent Labs  09/25/15 0511 09/26/15 0459  WBC 11.7* 5.8  HGB 9.7* 8.1*  HCT 31.8* 27.1*  PLT 289 211    BMET  Recent Labs  09/25/15 0511 09/26/15 0459  NA 142 139  K 3.9 2.8*  CL 96* 100*  CO2 37* 34*  GLUCOSE 132* 113*  BUN 48* 38*  CREATININE 1.56* 1.41*  CALCIUM 8.9 8.2*   PT/INR  Recent Labs  09/25/15 2125 09/26/15 0459  LABPROT 22.0* 22.7*  INR 1.89 1.97     Recent Labs Lab 09/24/15 1839  AST 34  ALT 24  ALKPHOS 102  BILITOT 0.6  PROT 8.7*  ALBUMIN 4.2     Lipase     Component Value Date/Time   LIPASE 53 (H) 09/24/2015 1839     Studies/Results: Dg Abd 1 View  Result Date: 09/25/2015 CLINICAL DATA:  Bowel obstruction due to peristomal herniation of small bowel. EXAM: ABDOMEN - 1  VIEW COMPARISON:  CT scans dated 09/24/2015 and radiographs dated 06/11/2015 FINDINGS: The small bowel distention visible on the prior CT scan has resolved. The bowel gas pattern is normal. Colostomy in the left lower quadrant is noted. Aortic atherosclerosis.  Cholecystectomy. IMPRESSION: Resolution of small bowel obstruction. Aortic atherosclerosis. Electronically Signed   By: Lorriane Shire M.D.   On: 09/25/2015 09:39   Ct Renal Stone Study  Result Date: 09/24/2015 CLINICAL DATA:  Acute on chronic back pain. Left flank pain. Leukocytosis. EXAM: CT ABDOMEN AND PELVIS WITHOUT CONTRAST TECHNIQUE: Multidetector CT imaging of the abdomen and pelvis was performed following the standard protocol without IV contrast. COMPARISON:  09/23/2015 FINDINGS: Lower chest: Large and possibly loculated pericardial effusion, similar to prior. Extensive coronary atherosclerosis. Calcified mitral valve. Pacer leads. Moderate cardiomegaly. Mild atelectasis in the right middle lobe and lingula. Mild atelectasis in both lower lobes. Hepatobiliary: Cholecystectomy Pancreas: Unremarkable Spleen: Unremarkable Adrenals/Urinary Tract: Abnormal appearance the kidneys with cortical contrast remaining on board, but some clearing of the medullary contrast. As far as I am aware the patient received contrast 33 hours prior to today's scan, the persistent contrast in the cortex is indicative of renal dysfunction. There is some excreted contrast in the collecting systems in urinary bladder is well. Scarring in the kidneys, right greater than  left. Bilateral renal cysts. Stomach/Bowel: Small bowel obstruction due to a peristomal hernia of small bowel, about the junction of the jejunum and ileum, extending above the descending colostomy site. Small bowel loops up to 4.1 cm. Distended stomach. Appendix normal. Rectal stump unremarkable. Vascular/Lymphatic: Aortoiliac atherosclerotic vascular disease. Scattered small retroperitoneal lymph nodes.  Reproductive: Unremarkable Other: Diffuse subcutaneous and mesenteric edema, query generalized third spacing of fluid. Edema is most notable along the lower anterior pelvic wall and pubis region. Musculoskeletal: Lumbar spondylosis and degenerative disc disease. IMPRESSION: 1. Small bowel obstruction due to a peristomal herniation of small bowel just above the descending colostomy. Small bowel dilated up to 4.1 cm in the stomach is distended. 2. Abnormal appearance the kidneys, with retained contrast medium in the renal cortex bilaterally, cannot exclude acute renal failure or contrast nephropathy causing this appearance. The patient last received IV contrast about 33 hours prior to today's scan, as best I know. 3. Stable large and possibly loculated pericardial effusion with extensive coronary atherosclerosis, calcified mitral valve, and moderate cardiomegaly. 4.  Aortoiliac atherosclerotic vascular disease. 5. Third spacing of fluid with generalized mesenteric and subcutaneous edema. 6. I do not see any renal stones but sensitivity for nonobstructive stones is low due to the presence of contrast medium within the urinary tract left over from yesterday. Electronically Signed   By: Van Clines M.D.   On: 09/24/2015 20:14   Prior to Admission medications   Medication Sig Start Date End Date Taking? Authorizing Provider  Cholecalciferol (VITAMIN D3) 5000 units CAPS Take 5,000 Units by mouth daily.   Yes Historical Provider, MD  DIGITEK 125 MCG tablet Take 0.5 tablets by mouth as directed. Take 0.5 tablet 5 days a week 06/26/15  Yes Historical Provider, MD  magnesium gluconate (MAGONATE) 500 MG tablet Take 1 tablet (500 mg total) by mouth daily. 06/26/15  Yes Daniel J Angiulli, PA-C  metolazone (ZAROXOLYN) 2.5 MG tablet Take 1 tablet (2.5 mg total) by mouth 2 (two) times a week. 09/02/15 10/26/16 Yes Mihai Croitoru, MD  Multiple Vitamin (MULTIVITAMIN WITH MINERALS) TABS tablet Take 1 tablet by mouth daily.    Yes Historical Provider, MD  oxyCODONE (ROXICODONE) 5 MG immediate release tablet 1 tablet every 8 hours for severe neck and back pain Patient taking differently: 5 mgs every 4 to 6 hours for severe neck and back pain 09/18/15  Yes Darlyne Russian, MD  potassium chloride (K-DUR) 10 MEQ tablet Take 2 tablets (20 mEq total) by mouth daily. With torsemide 08/12/15  Yes Mihai Croitoru, MD  saccharomyces boulardii (FLORASTOR) 250 MG capsule Take 1 capsule (250 mg total) by mouth 2 (two) times daily. 06/26/15  Yes Daniel J Angiulli, PA-C  torsemide (DEMADEX) 20 MG tablet Take 60 tablets by mouth daily.  08/19/15  Yes Historical Provider, MD  verapamil (CALAN-SR) 120 MG CR tablet Take 1 tablet (120 mg total) by mouth daily. 06/26/15  Yes Daniel J Angiulli, PA-C  warfarin (COUMADIN) 5 MG tablet Take 1 tablet (5 mg total) by mouth daily at 6 PM. Patient taking differently: Take 5 mg by mouth daily at 6 PM. Take 2.5mg s on Monday, Wednesday, and Friday, take 5mg s on all other days 06/26/15  Yes Daniel J Angiulli, PA-C     Medications: . digoxin  0.0625 mg Oral Daily  . enoxaparin (LOVENOX) injection  60 mg Subcutaneous QHS  . [START ON 09/28/2015] pantoprazole  40 mg Intravenous Q12H  . polyethylene glycol  17 g Oral Daily  . potassium chloride  10 mEq Intravenous Q1 Hr x 4  . verapamil  120 mg Oral Daily   . dextrose 5 % and 0.9% NaCl 75 mL/hr at 09/25/15 2218   Assessment/Plan Partial SBO s/p sigmoid colectomy for perforated diverticulitis with abscess 05/31/15 Dr. Georgette Dover Miralax x 2 recorded  So far Paracolostomy hernia - WOC consulted Leukocytosis - resolved Hypokalemia K+ 3.2 - 4 runs ordered for today  AKI with hypokalemia - K repleted 3.9 today, BUN/Cr still elevated. Recheck tomorrow. Atrial fibrillation - on coumadin at home Chronic pain on oxycodone HTN  dCHF  ID - none FEN - full liquids VTE - SCD's, lovenox   Plan:  I would replace K+, continue bowel regime and plan on some regime that  will keep her stools soft at home.  Oxycodone adds to he constipation issues.  Advance her diet and get her back on PO meds.  We will see again as needed.  I will ask wound care to look at her ostomy care and see if they have some advice.     LOS: 2 days    Paizley Ramella 09/26/2015 [phone]

## 2015-09-26 NOTE — Progress Notes (Addendum)
Initial Nutrition Assessment  DOCUMENTATION CODES:   Non-severe (moderate) malnutrition in context of chronic illness  INTERVENTION:   Provide Prostat liquid protein PO 30 ml BID with meals, each supplement provides 100 kcal, 15 grams protein. Encourage adequate fluid intake RD to continue to monitor  NUTRITION DIAGNOSIS:   Malnutrition related to chronic illness as evidenced by moderate depletion of body fat, moderate depletions of muscle mass.  GOAL:   Patient will meet greater than or equal to 90% of their needs  MONITOR:   PO intake, Supplement acceptance, Labs, Weight trends, I & O's  REASON FOR ASSESSMENT:   Malnutrition Screening Tool    ASSESSMENT:   82 y.o. female who presents to [facility] with nausea and vomiting and CT scan suggesting partial SBO.  Patient underwent sigmoid colectomy in May by Georgette Dover and Rickey for perforated diverticulitis with abscess.    Patient sleeping in room with no family at bedside. Unable to wake patient at this time. Pt with moderate fat and muscle depletion noted from partial NFPE performed. Per wound care note, pt reports eating a large chopped BBQ plate PTA and she was not drinking enough fluids. Pt experienced some N/V PTA.  Pt now on soft diet, no PO documented.  RD to order Prostat BID. Pt's weight is +7 lb d/t fluid accumulation. Pt with severe edema in LEs.  Medications: Miralax packet daily, D5 -.9% NaCl infusion at 75 ml /hr -provides 306 kcal Labs reviewed: Low K Mg WNL  Diet Order:  DIET SOFT Room service appropriate? Yes with Assist; Fluid consistency: Thin  Skin:  Reviewed, no issues  Last BM:  8/31  Height:   Ht Readings from Last 1 Encounters:  09/25/15 5\' 4"  (1.626 m)    Weight:   Wt Readings from Last 1 Encounters:  09/26/15 154 lb 15.7 oz (70.3 kg)    Ideal Body Weight:  54.5 kg  BMI:  Body mass index is 26.6 kg/m.  Estimated Nutritional Needs:   Kcal:  1500-1700  Protein:  70-80g  Fluid:   1.7L/day  EDUCATION NEEDS:   No education needs identified at this time  Clayton Bibles, MS, RD, LDN Pager: [phone] After Hours Pager: [phone]

## 2015-09-26 NOTE — Progress Notes (Signed)
Discharge planning, spoke with patient and daughter at beside. Discussed recommendation for HHPT, has had that before but not sure if she wants it again. She says she is agreeable if the doctor says she needs it, will talk with him about at next visit. Has used [facility] services in the past and would like to use them again, contacted Abby with [facility] for referral. Has RW, uses it for ambulation, does not need any other equipment. Plans to d/c to sisters house who has a full time caregiver that assist patient as well. Will await final orders and decision after discussing with attending.  [phone]

## 2015-09-27 ENCOUNTER — Inpatient Hospital Stay (HOSPITAL_COMMUNITY): Payer: Medicare Other

## 2015-09-27 ENCOUNTER — Encounter (HOSPITAL_COMMUNITY): Payer: Self-pay | Admitting: Radiology

## 2015-09-27 DIAGNOSIS — M5489 Other dorsalgia: Secondary | ICD-10-CM

## 2015-09-27 DIAGNOSIS — I319 Disease of pericardium, unspecified: Secondary | ICD-10-CM

## 2015-09-27 DIAGNOSIS — I10 Essential (primary) hypertension: Secondary | ICD-10-CM

## 2015-09-27 DIAGNOSIS — S72002A Fracture of unspecified part of neck of left femur, initial encounter for closed fracture: Secondary | ICD-10-CM

## 2015-09-27 HISTORY — DX: Fracture of unspecified part of neck of left femur, initial encounter for closed fracture: S72.002A

## 2015-09-27 LAB — CBC WITH DIFFERENTIAL/PLATELET
BASOS ABS: 0 10*3/uL (ref 0.0–0.1)
Basophils Relative: 0 %
EOS ABS: 0.2 10*3/uL (ref 0.0–0.7)
Eosinophils Relative: 3 %
HCT: 30.3 % — ABNORMAL LOW (ref 36.0–46.0)
HEMOGLOBIN: 9.1 g/dL — AB (ref 12.0–15.0)
LYMPHS PCT: 25 %
Lymphs Abs: 1.6 10*3/uL (ref 0.7–4.0)
MCH: 21.9 pg — ABNORMAL LOW (ref 26.0–34.0)
MCHC: 30 g/dL (ref 30.0–36.0)
MCV: 73 fL — ABNORMAL LOW (ref 78.0–100.0)
MONO ABS: 0.8 10*3/uL (ref 0.1–1.0)
Monocytes Relative: 12 %
NEUTROS ABS: 3.9 10*3/uL (ref 1.7–7.7)
Neutrophils Relative %: 60 %
PLATELETS: 256 10*3/uL (ref 150–400)
RBC: 4.15 MIL/uL (ref 3.87–5.11)
RDW: 15.4 % (ref 11.5–15.5)
WBC: 6.5 10*3/uL (ref 4.0–10.5)

## 2015-09-27 LAB — BASIC METABOLIC PANEL
ANION GAP: 7 (ref 5–15)
BUN: 29 mg/dL — AB (ref 6–20)
CO2: 31 mmol/L (ref 22–32)
Calcium: 8.9 mg/dL (ref 8.9–10.3)
Chloride: 97 mmol/L — ABNORMAL LOW (ref 101–111)
Creatinine, Ser: 1.2 mg/dL — ABNORMAL HIGH (ref 0.44–1.00)
GFR calc Af Amer: 45 mL/min — ABNORMAL LOW (ref >60)
GFR, EST NON AFRICAN AMERICAN: 39 mL/min — AB (ref >60)
Glucose, Bld: 105 mg/dL — ABNORMAL HIGH (ref 65–99)
POTASSIUM: 3.4 mmol/L — AB (ref 3.5–5.1)
SODIUM: 135 mmol/L (ref 135–145)

## 2015-09-27 LAB — ECHOCARDIOGRAM COMPLETE
Height: 64 in
Weight: 2479.73 oz

## 2015-09-27 LAB — PROTIME-INR
INR: 1.48
PROTHROMBIN TIME: 18 s — AB (ref 11.4–15.2)

## 2015-09-27 LAB — DIGOXIN LEVEL: DIGOXIN LVL: 0.6 ng/mL — AB (ref 0.8–2.0)

## 2015-09-27 MED ORDER — ALPRAZOLAM 0.25 MG PO TABS
0.2500 mg | ORAL_TABLET | Freq: Three times a day (TID) | ORAL | Status: DC | PRN
  Administered 2015-09-29 – 2015-09-30 (×2): 0.25 mg via ORAL
  Filled 2015-09-27 (×2): qty 1

## 2015-09-27 MED ORDER — WARFARIN SODIUM 5 MG PO TABS
5.0000 mg | ORAL_TABLET | Freq: Once | ORAL | Status: AC
  Administered 2015-09-27: 5 mg via ORAL
  Filled 2015-09-27: qty 1

## 2015-09-27 MED ORDER — PANTOPRAZOLE SODIUM 40 MG PO TBEC
40.0000 mg | DELAYED_RELEASE_TABLET | Freq: Two times a day (BID) | ORAL | Status: DC
  Administered 2015-09-27 – 2015-09-30 (×2): 40 mg via ORAL
  Filled 2015-09-27 (×5): qty 1

## 2015-09-27 MED ORDER — POTASSIUM CHLORIDE CRYS ER 20 MEQ PO TBCR: 20.0000 meq | EXTENDED_RELEASE_TABLET | Freq: Two times a day (BID) | ORAL | Status: DC

## 2015-09-27 MED ORDER — TORSEMIDE 20 MG PO TABS
20.0000 mg | ORAL_TABLET | Freq: Every day | ORAL | Status: DC
  Administered 2015-09-27 – 2015-09-28 (×2): 20 mg via ORAL
  Filled 2015-09-27 (×3): qty 1

## 2015-09-27 MED ORDER — HYDROMORPHONE HCL 1 MG/ML IJ SOLN
0.5000 mg | INTRAMUSCULAR | Status: DC | PRN
  Administered 2015-09-27 – 2015-09-29 (×8): 0.5 mg via INTRAVENOUS
  Filled 2015-09-27 (×8): qty 1

## 2015-09-27 MED ORDER — POTASSIUM CHLORIDE CRYS ER 20 MEQ PO TBCR: 20.0000 meq | EXTENDED_RELEASE_TABLET | Freq: Every day | ORAL | Status: DC

## 2015-09-27 MED ORDER — LIDOCAINE 5 % EX PTCH
1.0000 | MEDICATED_PATCH | CUTANEOUS | Status: DC
  Administered 2015-09-27 – 2015-09-30 (×4): 1 via TRANSDERMAL
  Filled 2015-09-27 (×4): qty 1

## 2015-09-27 MED ORDER — PIPERACILLIN-TAZOBACTAM 3.375 G IVPB
3.3750 g | Freq: Three times a day (TID) | INTRAVENOUS | Status: DC
  Administered 2015-09-27 – 2015-09-30 (×8): 3.375 g via INTRAVENOUS
  Filled 2015-09-27 (×9): qty 50

## 2015-09-27 NOTE — Progress Notes (Signed)
Pharmacy IV to PO conversion  The patient is receiving PANTOPRAZOLE by the intravenous route.  Based on criteria approved by the Pharmacy and [place], the medication is being converted to the equivalent oral dose form.   No active GI bleeding or impaired absorption  Not s/p esophagectomy  Documented ability to take oral medications for > 24 hr  Plan to continue treatment for at least 1 day  If you have any questions about this conversion, please contact the Pharmacy Department (ext [phone]).  Thank you.  Reuel Boom, [name] Pager: [phone] 09/27/2015, 11:38 AM

## 2015-09-27 NOTE — Progress Notes (Signed)
ANTICOAGULATION CONSULT NOTE - Initial Consult  Pharmacy Consult for warfarin Indication: atrial fibrillation  Allergies  Allergen Reactions  . Anti-Inflammatory Enzyme [Nutritional Supplements] Other (See Comments)    Retains fluids and headaches  . Iodinated Diagnostic Agents Hives    HIVES 15MIN S/P IV CONTRAST INJECTION,WILL NEED 13 HR PREP FOR FUTURE INJECTIONS, ok s/p 50mg  po benadryl//a.calhoun  . Spironolactone Other (See Comments)    Hair loss  . Arthrotec [Diclofenac-Misoprostol] Other (See Comments)    unknown  . Biaxin [Clarithromycin] Other (See Comments)    unknown  . Plavix [Clopidogrel Bisulfate] Other (See Comments)    unknown    Patient Measurements: Height: 5\' 4"  (162.6 cm) Weight: 154 lb 15.7 oz (70.3 kg) IBW/kg (Calculated) : 54.7   Vital Signs: Temp: 98.6 F (37 C) (09/01 0456) Temp Source: Oral (09/01 0456) BP: 140/65 (09/01 0456) Pulse Rate: 65 (09/01 0456)  Labs:  Recent Labs  09/25/15 0511 09/25/15 2125 09/26/15 0459 09/27/15 0449  HGB 9.7*  --  8.1* 9.1*  HCT 31.8*  --  27.1* 30.3*  PLT 289  --  211 256  LABPROT 23.4* 22.0* 22.7* 18.0*  INR 2.05 1.89 1.97 1.48  CREATININE 1.56*  --  1.41* 1.20*    Estimated Creatinine Clearance: 30 mL/min (by C-G formula based on SCr of 1.2 mg/dL).   Medical History: Past Medical History:  Diagnosis Date  . Anemia   . Arthritis   . Chronic atrial fibrillation (HCC)    a. on Coumadin, Digoxin, and Verapamil  . Chronic diastolic (congestive) heart failure ([place])    a. Echo 01/2015 with EF of 55-60%.  . History of GI bleed    a. secondary to AVM's. Treated with Fe infusion  . History of shingles   . HTN (hypertension)   . LVH (left ventricular hypertrophy)   . Obesity   . Pericardial effusion    a. s/p pericardial window 01/16/15  . Positive TB test   . Sleep apnea    mild-no cpap  . Stroke [facility])    a. 2013: right frontal  . Tachycardia-bradycardia syndrome ([place])    a. s/p Medtronic  [place], model number [id number], serial number [id number] H 12/2013    Assessment: 80 yoF on chronic warfarin for A-fib.  INR therapeutic on admission.  She is admitted with partial SBO, lovenox started while warfarin on hold.  Pharmacy consulted to resume warfarin.   Home dose warfarin: 2.5mg  on Mon, Wed, Fri and 5mg  on all other days.  Last dose 8/28  Today, 09/27/2015:  INR subtherapeutic and lower today after 2 missed doses and 5 mg x 1  H/H low but improved; plts WNL  Interacting medications: none major  Soft diet resumed today, eating 75% of meals  Goal of Therapy:  INR 2-3   Plan:   Repeat warfarin 5 mg PO x 1 tonight.  Despite INR remaining low, do not feel bridging is warranted due to AFib indication, being in NSR, and advanced age; MD in agreement   Daily INR  CBC q72 hr  F/u for S/Sx bleeding or thrombosis  Reuel Boom, [name], BCPS Pager: [phone] 09/27/2015, 11:16 AM

## 2015-09-27 NOTE — Progress Notes (Signed)
  Echocardiogram 2D Echocardiogram has been performed.  [name] 09/27/2015, 3:15 PM

## 2015-09-27 NOTE — Progress Notes (Signed)
PROGRESS NOTE    [name]  [id number] DOB: 02-19-1923 DOA: 09/24/2015 PCP: [name], MD    Brief Narrative: Small bowel obstruction, 82 yo female with recent colectomy with colostomy due to diverticulitis. Presented with hypokalemia and dehydration. Responding well to conservative care. Surgery on board. Positive bowel movements. K repletion.    Assessment & Plan:   Principal Problem:   AKI (acute kidney injury) ([place]) Active Problems:   Pericardial effusion, Large   Chronic atrial fibrillation (HCC)   Essential hypertension, benign   Protein-calorie malnutrition, moderate (HCC)   Chronic diastolic congestive heart failure (HCC)   Normocytic anemia   Hypokalemia   SBO (small bowel obstruction) ([place])   Colostomy in place ([place])   1.Small bowel obstruction. Resolved, patient tolerating po well, positive formed stool on colostomy bag, continue close observation of parastomal hernia, at this point no signs of incarceration.   2. AKI with Hypokalemia. Renal function with cr at 1,2 from 1,4, K at 3,4, Will continue to replete K with kcl and will resume patient on torsemide to keep negative fluid balance, follow on renal function and electrolytes in am.   3. Chronic atrial fibrillation. Continue rate control with digoxin, verapamil, continue as needed metoprolol IV. Heart rate persistent between 70 to 80 bpm.  Subtherapeutic INR down to 1,4, will continue warfarin, will hold on bridging for now.   4. HTN. Blood pressure systolic [id number] systolic.   6. Chronic diastolic heart failure. Will resume diuretic therapy with torsemide, with close follow up of renal function. Ct abdomen noted large loculated pericardial effusion, old records personally reviewed, echocardiogram on  01/17, with small pericardial effusion. Will follow on echocardiogram on this admission.   7. Back pain. Requiring  IV hydromorphone, out of bed as tolerated, physical therapy evaluation.   Persistent back pain, I call patient's primary care physician at [phone], Dr Wilber Bihari, patient has been developing worsening back pain, upper back, requesting higher doses of narcotics. Concerned for compression fractures. Will order CT cervical and thoracic. Will decrease dose of hydromorphone and add lidocaine patch. Intention to reduce use of narcotics. Per patient's request will start low dose of alprazolam.     DVT prophylaxis:warfarin Code Status:full  Family Communication:No family at the bedside Disposition Plan:Home   Consultants:     Procedures:    Antimicrobials:     Subjective: Patient with positive bowel movement. Persistent back pain, using hydromorphone for pain control. No nausea or vomiting, no chest pain or dyspnea. Yesterday had episode of desaturation.   Objective: Vitals:   09/26/15 1427 09/26/15 2144 09/27/15 0415 09/27/15 0456  BP: (!) 101/54 136/75  140/65  Pulse: 65 81  65  Resp: 18 18  16   Temp: 98.7 F (37.1 C) 97.3 F (36.3 C)  98.6 F (37 C)  TempSrc: Oral Oral  Oral  SpO2: 93% 93% (!) 89% 94%  Weight:      Height:        Intake/Output Summary (Last 24 hours) at 09/27/15 0954 Last data filed at 09/26/15 2204  Gross per 24 hour  Intake             2020 ml  Output              404 ml  Net             1616 ml   Filed Weights   09/24/15 2314 09/26/15 0607  Weight: 67.1 kg (147 lb 14.9 oz) 70.3 kg (154  lb 15.7 oz)    Examination:  General exam: deconditioned. E ENT; Mild conjunctival pallor, oral mucosa moist. Respiratory system: Clear to auscultation. Respiratory effort normal. Mild rales at bases. Mild expiratory wheezing.  Cardiovascular system: S1 & S2 heard, RRR. No JVD, murmurs, rubs, gallops or clicks. No pedal edema. Gastrointestinal system: Abdomen is nondistended, soft and nontender. No organomegaly or masses felt. Normal bowel sounds heard. Colostomy bag in place.  Central nervous system: Alert and oriented. No focal  neurological deficits. Extremities: Symmetric 5 x 5 power. No pain at back palpation (had analgesics before my exam) Skin: No rashes, lesions or ulcers Psychiatry: Judgement and insight appear normal. Mood & affect appropriate.     Data Reviewed: I have personally reviewed following labs and imaging studies  CBC:  Recent Labs Lab 09/24/15 1839 09/25/15 0511 09/26/15 0459 09/27/15 0449  WBC 16.0* 11.7* 5.8 6.5  NEUTROABS 13.4*  --  4.1 3.9  HGB 10.1* 9.7* 8.1* 9.1*  HCT 33.5* 31.8* 27.1* 30.3*  MCV 74.0* 73.3* 73.2* 73.0*  PLT 358 289 211 [id number]   Basic Metabolic Panel:  Recent Labs Lab 09/24/15 1203 09/24/15 1839 09/24/15 2030 09/25/15 0511 09/26/15 0459 09/26/15 1747 09/27/15 0449  NA 139 139  --  142 139  --  135  K 4.0 3.1*  --  3.9 2.8* 3.2* 3.4*  CL 95* 93*  --  96* 100*  --  97*  CO2 33* 35*  --  37* 34*  --  31  GLUCOSE 136* 137*  --  132* 113*  --  105*  BUN 41* 47*  --  48* 38*  --  29*  CREATININE 1.61* 1.68*  --  1.56* 1.41*  --  1.20*  CALCIUM 9.3 9.5  --  8.9 8.2*  --  8.9  MG  --   --  2.4  --  2.0  --   --    GFR: Estimated Creatinine Clearance: 30 mL/min (by C-G formula based on SCr of 1.2 mg/dL). Liver Function Tests:  Recent Labs Lab 09/24/15 1839  AST 34  ALT 24  ALKPHOS 102  BILITOT 0.6  PROT 8.7*  ALBUMIN 4.2    Recent Labs Lab 09/24/15 1839  LIPASE 53*   No results for input(s): AMMONIA in the last 168 hours. Coagulation Profile:  Recent Labs Lab 09/24/15 1839 09/25/15 0511 09/25/15 2125 09/26/15 0459 09/27/15 0449  INR 2.21 2.05 1.89 1.97 1.48   Cardiac Enzymes: No results for input(s): CKTOTAL, CKMB, CKMBINDEX, TROPONINI in the last 168 hours. BNP (last 3 results) No results for input(s): PROBNP in the last 8760 hours. HbA1C: No results for input(s): HGBA1C in the last 72 hours. CBG: No results for input(s): GLUCAP in the last 168 hours. Lipid Profile: No results for input(s): CHOL, HDL, LDLCALC, TRIG, CHOLHDL,  LDLDIRECT in the last 72 hours. Thyroid Function Tests: No results for input(s): TSH, T4TOTAL, FREET4, T3FREE, THYROIDAB in the last 72 hours. Anemia Panel: No results for input(s): VITAMINB12, FOLATE, FERRITIN, TIBC, IRON, RETICCTPCT in the last 72 hours. Sepsis Labs:  Recent Labs Lab 09/24/15 1856  LATICACIDVEN 1.66    No results found for this or any previous visit (from the past 240 hour(s)).       Radiology Studies: No results found.      Scheduled Meds: . digoxin  0.0625 mg Oral Daily  . feeding supplement (PRO-STAT SUGAR FREE 64)  30 mL Oral BID  . lidocaine  1 patch Transdermal Q24H  . [START  ON 09/28/2015] pantoprazole  40 mg Intravenous Q12H  . polyethylene glycol  17 g Oral Daily  . verapamil  120 mg Oral Daily  . Warfarin - Pharmacist Dosing Inpatient   Does not apply q1800   Continuous Infusions:    LOS: 3 days        Mauricio Gerome Apley, MD [facility] Pager [phone]  If 7PM-7AM, please contact night-coverage [email or web address] Password [email or web address] 09/27/2015, 9:54 AM

## 2015-09-27 NOTE — Progress Notes (Signed)
Physical Therapy Treatment Patient Details Name: [name] MRN: [id number] DOB: 1923/08/21 Today's Date: 09/27/2015    History of Present Illness [name] is a 82 y.o. female with a past medical history significant for Afib on warfarin with pacer, chronic diastolic CHF, chronic pericardial effusion s/p window in 2016, and recent recurrent diverticulitis s/p colostomy in May 2017 who presents with back pain and vomiting and AKI. S/P . Found to have partial bowel obstruction.    PT Comments    Pt feeling "better" and no c/o back pain.  Assisted OOB to bathroom then in hallway.  Slow but steady gait.  Returned to room and positioned in recliner with heating pad to mid back.  Follow Up Recommendations  Home health PT;Supervision/Assistance - 24 hour (lives with sister)     Equipment Recommendations  None recommended by PT  Has a walker  Recommendations for Other Services       Precautions / Restrictions Precautions Precautions: Fall Precaution Comments: pain in back    Mobility  Bed Mobility Overal bed mobility: Modified Independent             General bed mobility comments: increased time  Transfers Overall transfer level: Needs assistance Equipment used: None Transfers: Sit to/from [facility] Sit to Stand: Min guard;Min assist Stand pivot transfers: Min guard;Min assist       General transfer comment: <25% VC's on safety with turns and VC for hand placement esp with stand to sit to control decend.   Ambulation/Gait Ambulation/Gait assistance: Min assist Ambulation Distance (Feet): 52 Feet Assistive device: Rolling walker (2 wheeled) Gait Pattern/deviations: Step-through pattern;Decreased stride length Gait velocity: decreased   General Gait Details: good alternating gait with one VC safety with turn completion using walker.  Pt admits to holding furniture in her home.   Noted mild upper respiratory congestion.  RA 96%.      Stairs            Wheelchair Mobility    Modified Rankin (Stroke Patients Only)       Balance                                    Cognition Arousal/Alertness: Awake/alert Behavior During Therapy: WFL for tasks assessed/performed Overall Cognitive Status: Within Functional Limits for tasks assessed                      Exercises      General Comments        Pertinent Vitals/Pain      Home Living                      Prior Function            PT Goals (current goals can now be found in the care plan section) Progress towards PT goals: Progressing toward goals    Frequency  Min 3X/week    PT Plan Current plan remains appropriate    Co-evaluation             End of Session Equipment Utilized During Treatment: Gait belt Activity Tolerance: Patient tolerated treatment well Patient left: in chair;with call bell/phone within reach;with family/visitor present     Time: [id number] PT Time Calculation (min) (ACUTE ONLY): 31 min  Charges:  $Gait Training: 8-22 mins $Therapeutic Activity: 8-22 mins  G Codes:      Rica Koyanagi  PTA [facility]  Acute  Rehab Pager      [phone]

## 2015-09-27 NOTE — Progress Notes (Signed)
Pharmacy Antibiotic Note  [name] is a 82 y.o. female admitted on 09/24/2015 with 82 yo female with recent colectomy with colostomy due to diverticulitis. Presented with hypokalemia and dehydration, SBO.   Pharmacy has been consulted for zosyn dosing for aspiration pneumonia.   Plan: Zosyn 3.375g IV Q8H infused over 4hrs for CrCl > 23mls/min Follow renal function and clinical course   Height: 5\' 4"  (162.6 cm) Weight: 154 lb 15.7 oz (70.3 kg) IBW/kg (Calculated) : 54.7  Temp (24hrs), Avg:98.2 F (36.8 C), Min:97.3 F (36.3 C), Max:98.6 F (37 C)   Recent Labs Lab 09/24/15 1203 09/24/15 1839 09/24/15 1856 09/25/15 0511 09/26/15 0459 09/27/15 0449  WBC  --  16.0*  --  11.7* 5.8 6.5  CREATININE 1.61* 1.68*  --  1.56* 1.41* 1.20*  LATICACIDVEN  --   --  1.66  --   --   --     Estimated Creatinine Clearance: 30 mL/min (by C-G formula based on SCr of 1.2 mg/dL).    Allergies  Allergen Reactions  . Anti-Inflammatory Enzyme [Nutritional Supplements] Other (See Comments)    Retains fluids and headaches  . Iodinated Diagnostic Agents Hives    HIVES 15MIN S/P IV CONTRAST INJECTION,WILL NEED 13 HR PREP FOR FUTURE INJECTIONS, ok s/p 50mg  po benadryl//a.calhoun  . Spironolactone Other (See Comments)    Hair loss  . Arthrotec [Diclofenac-Misoprostol] Other (See Comments)    unknown  . Biaxin [Clarithromycin] Other (See Comments)    unknown  . Plavix [Clopidogrel Bisulfate] Other (See Comments)    unknown    Antimicrobials this admission: 9/1 zosyn >>  Microbiology results:   Thank you for allowing pharmacy to be a part of this patient's care.  Dolly Rias RPh 09/27/2015, 6:48 PM Pager [phone]

## 2015-09-28 ENCOUNTER — Inpatient Hospital Stay (HOSPITAL_COMMUNITY): Payer: Medicare Other

## 2015-09-28 DIAGNOSIS — J189 Pneumonia, unspecified organism: Secondary | ICD-10-CM

## 2015-09-28 LAB — CBC WITH DIFFERENTIAL/PLATELET
BASOS PCT: 0 %
Basophils Absolute: 0 10*3/uL (ref 0.0–0.1)
EOS PCT: 1 %
Eosinophils Absolute: 0.1 10*3/uL (ref 0.0–0.7)
HCT: 28.9 % — ABNORMAL LOW (ref 36.0–46.0)
Hemoglobin: 8.7 g/dL — ABNORMAL LOW (ref 12.0–15.0)
LYMPHS ABS: 1 10*3/uL (ref 0.7–4.0)
Lymphocytes Relative: 14 %
MCH: 22.1 pg — AB (ref 26.0–34.0)
MCHC: 30.1 g/dL (ref 30.0–36.0)
MCV: 73.5 fL — AB (ref 78.0–100.0)
MONO ABS: 1 10*3/uL (ref 0.1–1.0)
Monocytes Relative: 14 %
NEUTROS ABS: 4.8 10*3/uL (ref 1.7–7.7)
Neutrophils Relative %: 71 %
PLATELETS: 260 10*3/uL (ref 150–400)
RBC: 3.93 MIL/uL (ref 3.87–5.11)
RDW: 15.7 % — AB (ref 11.5–15.5)
WBC: 6.9 10*3/uL (ref 4.0–10.5)

## 2015-09-28 LAB — BASIC METABOLIC PANEL
Anion gap: 9 (ref 5–15)
BUN: 26 mg/dL — AB (ref 6–20)
CHLORIDE: 97 mmol/L — AB (ref 101–111)
CO2: 30 mmol/L (ref 22–32)
CREATININE: 1.06 mg/dL — AB (ref 0.44–1.00)
Calcium: 8.8 mg/dL — ABNORMAL LOW (ref 8.9–10.3)
GFR calc Af Amer: 52 mL/min — ABNORMAL LOW (ref >60)
GFR calc non Af Amer: 45 mL/min — ABNORMAL LOW (ref >60)
Glucose, Bld: 108 mg/dL — ABNORMAL HIGH (ref 65–99)
Potassium: 3.1 mmol/L — ABNORMAL LOW (ref 3.5–5.1)
SODIUM: 136 mmol/L (ref 135–145)

## 2015-09-28 LAB — PROTIME-INR
INR: 1.53
Prothrombin Time: 18.5 seconds — ABNORMAL HIGH (ref 11.4–15.2)

## 2015-09-28 MED ORDER — ACETAMINOPHEN 500 MG PO TABS
500.0000 mg | ORAL_TABLET | Freq: Four times a day (QID) | ORAL | Status: DC
  Administered 2015-09-28 – 2015-09-30 (×8): 500 mg via ORAL
  Filled 2015-09-28 (×8): qty 1

## 2015-09-28 MED ORDER — WARFARIN SODIUM 5 MG PO TABS
7.5000 mg | ORAL_TABLET | Freq: Once | ORAL | Status: AC
  Administered 2015-09-28: 7.5 mg via ORAL
  Filled 2015-09-28: qty 1

## 2015-09-28 MED ORDER — POTASSIUM CHLORIDE CRYS ER 20 MEQ PO TBCR
40.0000 meq | EXTENDED_RELEASE_TABLET | ORAL | Status: AC
  Administered 2015-09-28 (×2): 40 meq via ORAL
  Filled 2015-09-28 (×2): qty 2

## 2015-09-28 NOTE — Progress Notes (Signed)
PROGRESS NOTE    [name]  [id number] DOB: [birth year] DOA: 09/24/2015 PCP: [name], MD    Brief Narrative: Small bowel obstruction, 82 yo female with recent colectomy with colostomy due to diverticulitis. Presented with hypokalemia and dehydration. Responding well to conservative care. Surgery on board. Positive bowel movements. K repletion. Found right lower lobe pneumonia complicated by hypoxemia, follow echocardiogram with worsening pericardial effusion. Spine imaging with no bony acute changes.     Assessment & Plan:   Principal Problem:   AKI (acute kidney injury) ([place]) Active Problems:   Pericardial effusion, Large   Chronic atrial fibrillation (HCC)   Essential hypertension, benign   Protein-calorie malnutrition, moderate (HCC)   Chronic diastolic congestive heart failure (HCC)   Normocytic anemia   Hypokalemia   SBO (small bowel obstruction) ([place])   Colostomy in place ([place])   1.Small bowel obstruction. Resolved, patient tolerating po well, positive formed stool on colostomy bag, continue close observation of parastomal hernia, at this point no signs of incarceration and no further abdominal pain.   2. AKI with Hypokalemia.  Cr trending down, will continue torsemide at lower dose than home dose, K down to 3.1 will replete K with kcl po 40 meq x2 doses, follow renal panel in am. Noted persistent edema on lower extremities.   3. Chronic atrial fibrillation. Continue rate control with digoxin, verapamil, Heart rate persistent between60 to 90 bpm.  Subtherapeutic INR down to 1,5, will continue warfarin, will hold on bridging for now, follow INR in am.   4. HTN. Blood pressure systolic [id number], will continue diuresis with torsemide with lower dose than her home regimen.   6. Chronic diastolic heart failure. Tolerating well torsemide, blood pressure stable with systolic above [id number]. Follow echocardiogram noted moderate posterior pericardial effusion with normal LV  function and no compromise of right ventricle. Compared to 01/17 worsening in volume. Patient had pericardial window done on 12/16 with 1100 cc yellow fluid drained. Will call cardiology to inform findings.   7. Back pain. Requiring  IV hydromorphone,out of bed tid with meals, physical therapy evaluation. Imaging with no acute bony abnormalities, will continue lidocaine patch, will start on acetaminophen q 6 hours scheduled, and hydromorphone as needed for break through pain.   8. Pneumonia (HCAP). Patient with desaturations down to 86 % on room air, CT thoracic spine noted bibasilar infiltrates. Follow up chest film this am personally reviewed noted right lower to middle lobe infiltrate. Started on Zosyn IV, will need home oxygen screen before discharge. Will add incentive spirometer.   DVT prophylaxis:warfarin Code Status:full  Family Communication:No family at the bedside Disposition Plan:Home   Consultants:   surgery  Procedures:      Antimicrobials:   Zosyn #1    Subjective: Patient with persistent back pain, worse with movement and improved with IV narcotics, no nausea or vomiting, positive productive cough, no dyspnea or chest pain. Positive bowel movements and tolerating po well.   Objective: Vitals:   09/27/15 0456 09/27/15 1512 09/27/15 2020 09/28/15 0620  BP: 140/65 (!) 115/53 108/78 115/70  Pulse: 65 68 83 90  Resp: 16 16 20 18   Temp: 98.6 F (37 C) 98.6 F (37 C) 98.4 F (36.9 C) 98.6 F (37 C)  TempSrc: Oral Oral Oral Oral  SpO2: 94% (!) 86% 92% 90%  Weight:    74.5 kg (164 lb 3.9 oz)  Height:        Intake/Output Summary (Last 24 hours) at 09/28/15 [id number] Last data  filed at 09/28/15 0554  Gross per 24 hour  Intake              490 ml  Output                0 ml  Net              490 ml   Filed Weights   09/24/15 2314 09/26/15 0607 09/28/15 0620  Weight: 67.1 kg (147 lb 14.9 oz) 70.3 kg (154 lb 15.7 oz) 74.5 kg (164 lb 3.9 oz)     Examination:  General exam: Not in pain or dyspnea E EMT: no conjunctival pallor, oral mucosa dry.  Respiratory system: . Respiratory effort normal. Positive rales at bases, more right than left, no wheezing, rales or rhonchi. Cardiovascular system: S1 & S2 heard, RRR. No JVD, rubs, gallops or clicks. Positive lower extremity edema bilateral +++ pitting, symmetric. Gastrointestinal system: Abdomen is nondistended, soft and nontender. No organomegaly or masses felt. Normal bowel sounds heard. Colostomy bag in place.  Central nervous system: Alert and oriented. No focal neurological deficits. Extremities: Symmetric 5 x 5 power. Skin: No rashes, lesions or ulcers Psychiatry: Judgement and insight appear normal. Mood & affect appropriate.     Data Reviewed: I have personally reviewed following labs and imaging studies  CBC:  Recent Labs Lab 09/24/15 1839 09/25/15 0511 09/26/15 0459 09/27/15 0449 09/28/15 0457  WBC 16.0* 11.7* 5.8 6.5 6.9  NEUTROABS 13.4*  --  4.1 3.9 4.8  HGB 10.1* 9.7* 8.1* 9.1* 8.7*  HCT 33.5* 31.8* 27.1* 30.3* 28.9*  MCV 74.0* 73.3* 73.2* 73.0* 73.5*  PLT 358 289 211 256 [id number]   Basic Metabolic Panel:  Recent Labs Lab 09/24/15 1839 09/24/15 2030 09/25/15 0511 09/26/15 0459 09/26/15 1747 09/27/15 0449 09/28/15 0457  NA 139  --  142 139  --  135 136  K 3.1*  --  3.9 2.8* 3.2* 3.4* 3.1*  CL 93*  --  96* 100*  --  97* 97*  CO2 35*  --  37* 34*  --  31 30  GLUCOSE 137*  --  132* 113*  --  105* 108*  BUN 47*  --  48* 38*  --  29* 26*  CREATININE 1.68*  --  1.56* 1.41*  --  1.20* 1.06*  CALCIUM 9.5  --  8.9 8.2*  --  8.9 8.8*  MG  --  2.4  --  2.0  --   --   --    GFR: Estimated Creatinine Clearance: 34.9 mL/min (by C-G formula based on SCr of 1.06 mg/dL). Liver Function Tests:  Recent Labs Lab 09/24/15 1839  AST 34  ALT 24  ALKPHOS 102  BILITOT 0.6  PROT 8.7*  ALBUMIN 4.2    Recent Labs Lab 09/24/15 1839  LIPASE 53*   No results  for input(s): AMMONIA in the last 168 hours. Coagulation Profile:  Recent Labs Lab 09/25/15 0511 09/25/15 2125 09/26/15 0459 09/27/15 0449 09/28/15 0457  INR 2.05 1.89 1.97 1.48 1.53   Cardiac Enzymes: No results for input(s): CKTOTAL, CKMB, CKMBINDEX, TROPONINI in the last 168 hours. BNP (last 3 results) No results for input(s): PROBNP in the last 8760 hours. HbA1C: No results for input(s): HGBA1C in the last 72 hours. CBG: No results for input(s): GLUCAP in the last 168 hours. Lipid Profile: No results for input(s): CHOL, HDL, LDLCALC, TRIG, CHOLHDL, LDLDIRECT in the last 72 hours. Thyroid Function Tests: No results for input(s): TSH, T4TOTAL, FREET4,  T3FREE, THYROIDAB in the last 72 hours. Anemia Panel: No results for input(s): VITAMINB12, FOLATE, FERRITIN, TIBC, IRON, RETICCTPCT in the last 72 hours. Sepsis Labs:  Recent Labs Lab 09/24/15 1856  LATICACIDVEN 1.66    No results found for this or any previous visit (from the past 240 hour(s)).       Radiology Studies: Dg Chest 2 View  Result Date: 09/28/2015 CLINICAL DATA:  Shortness of breath. EXAM: CHEST  2 VIEW COMPARISON:  06/06/2015 FINDINGS: The pacer wires are stable. Stable cardiac enlargement and tortuous and calcified thoracic aorta. Chronic underlying lung disease with superimposed pleural effusions and bibasilar infiltrates. The bony thorax is intact. IMPRESSION: Stable cardiac enlargement. Bilateral pleural effusions and bibasilar infiltrates. Electronically Signed   By: Marijo Sanes M.D.   On: 09/28/2015 08:27   Ct Cervical Spine Wo Contrast  Result Date: 09/27/2015 CLINICAL DATA:  82 year old female with recent bowel surgery due to diverticulitis. Cervicothoracic pain radiating to the left shoulder. No known injury. Initial encounter. EXAM: CT CERVICAL SPINE WITHOUT CONTRAST TECHNIQUE: Multidetector CT imaging of the cervical spine was performed without intravenous contrast. Multiplanar CT image  reconstructions were also generated. COMPARISON:  Neck radiographs 10/17/2011. FINDINGS: Study is intermittently, up to moderately degraded by motion artifact despite repeated imaging attempts. Osteopenia. Visualized skull base is intact. No atlanto-occipital dissociation. Straightening and mild reversal of cervical lordosis. Cervicothoracic junction alignment is within normal limits. Bilateral posterior element alignment is within normal limits. No cervical spine fracture identified. Negative visualized posterior fossa. Calcified atherosclerosis at the skull base. Visible tympanic cavities and mastoids are clear. Mild for age calcified carotid atherosclerosis in the neck. Partially retropharyngeal course of the carotids. Otherwise negative noncontrast neck soft tissues. Negative lung apices. Chronic cervical spine disc and posterior element degeneration. Mild degenerative appearing anterolisthesis at C3-C4 and C4-C5. Still, there is only mild if any cervical spinal stenosis suspected, primarily at C5-C6. IMPRESSION: 1. Osteopenia. Motion degraded exam despite repeated imaging attempts. No acute osseous abnormality identified in the cervical spine. 2. Cervical spine degeneration, but only mild if any suspected cervical spinal stenosis. Electronically Signed   By: Genevie Ann M.D.   On: 09/27/2015 15:01   Ct Thoracic Spine Wo Contrast  Result Date: 09/27/2015 CLINICAL DATA:  82 year old female with recent bowel surgery due to diverticulitis. Cervicothoracic pain radiating to the left shoulder. No known injury. Initial encounter. EXAM: CT THORACIC SPINE WITHOUT CONTRAST TECHNIQUE: Multidetector CT imaging of the thoracic spine was performed without intravenous contrast administration. Multiplanar CT image reconstructions were also generated. COMPARISON:  Cervical spine CT from today reported separately. Thoracic spine radiographs 08/28/2015. Chest CT 07/04/2011. FINDINGS: Osteopenia. Normal thoracic segmentation.  Stable mild exaggeration of thoracic kyphosis since 2013. Thoracic vertebrae appear intact. Posterior ribs appear intact. Visible upper lumbar levels appear intact. No CT evidence of thoracic spinal stenosis. Mild for age thoracic spine degeneration. Calcified aortic atherosclerosis. Cardiomegaly. Pericardial effusion not well visualized today. Small to moderate right and small left layering pleural effusions. Negative visualized noncontrast upper abdominal viscera. Patchy and confluent left lower lobe pulmonary opacity, appears peribronchial and inflammatory in nature. IMPRESSION: 1. Osteopenia. No acute osseous abnormality identified. In the thoracic spine. Mild for age thoracic spine degeneration. 2. Confluent left lower lobe pulmonary opacity could reflect aspiration or pneumonia in this setting. 3. Moderate layering right and small layering left pleural effusions. 4.  Calcified aortic atherosclerosis. 5. Cardiomegaly. Large pericardial effusion better demonstrated on CT Abdomen and Pelvis 09/24/2015 (please see that report). Electronically Signed  By: Genevie Ann M.D.   On: 09/27/2015 15:07        Scheduled Meds: . digoxin  0.0625 mg Oral Daily  . feeding supplement (PRO-STAT SUGAR FREE 64)  30 mL Oral BID  . lidocaine  1 patch Transdermal Q24H  . pantoprazole  40 mg Oral BID  . piperacillin-tazobactam (ZOSYN)  IV  3.375 g Intravenous Q8H  . polyethylene glycol  17 g Oral Daily  . potassium chloride  20 mEq Oral Daily  . torsemide  20 mg Oral Daily  . verapamil  120 mg Oral Daily  . Warfarin - Pharmacist Dosing Inpatient   Does not apply q1800   Continuous Infusions:    LOS: 4 days       Lateka Rady Gerome Apley, MD [facility] Pager [phone]  If 7PM-7AM, please contact night-coverage [email or web address] Password [facility] 09/28/2015, 9:14 AM

## 2015-09-28 NOTE — Progress Notes (Signed)
[place] for warfarin Indication: atrial fibrillation  Allergies  Allergen Reactions  . Anti-Inflammatory Enzyme [Nutritional Supplements] Other (See Comments)    Retains fluids and headaches  . Iodinated Diagnostic Agents Hives    HIVES 15MIN S/P IV CONTRAST INJECTION,WILL NEED 13 HR PREP FOR FUTURE INJECTIONS, ok s/p 50mg  po benadryl//a.calhoun  . Spironolactone Other (See Comments)    Hair loss  . Arthrotec [Diclofenac-Misoprostol] Other (See Comments)    unknown  . Biaxin [Clarithromycin] Other (See Comments)    unknown  . Plavix [Clopidogrel Bisulfate] Other (See Comments)    unknown    Patient Measurements: Height: 5\' 4"  (162.6 cm) Weight: 164 lb 3.9 oz (74.5 kg) IBW/kg (Calculated) : 54.7   Vital Signs: Temp: 98.6 F (37 C) (09/02 0620) Temp Source: Oral (09/02 0620) BP: 115/70 (09/02 0620) Pulse Rate: 90 (09/02 0620)  Labs:  Recent Labs  09/26/15 0459 09/27/15 0449 09/28/15 0457  HGB 8.1* 9.1* 8.7*  HCT 27.1* 30.3* 28.9*  PLT 211 256 260  LABPROT 22.7* 18.0* 18.5*  INR 1.97 1.48 1.53  CREATININE 1.41* 1.20* 1.06*    Estimated Creatinine Clearance: 34.9 mL/min (by C-G formula based on SCr of 1.06 mg/dL).  Assessment: 80 yoF on chronic warfarin for A-fib.  INR therapeutic on admission, but warfarin was held for NPO status.  She is admitted with partial SBO, lovenox started while warfarin on hold.  Pharmacy is consulted to resume warfarin on 8/31. Home dose warfarin: 2.5mg  on Mon, Wed, Fri and 5mg  on all other days.  Last dose 8/28.  Today, 09/28/2015:  INR 1.53 remains subtherapeutic after missing 2 doses, then 5mg  x2 doses.  H/H low but improved; plts WNL  Interacting medications: none major  SBO resolved, diet resumed and she is eating up to 95% of meals  Goal of Therapy:  INR 2-3   Plan:   Boosted warfarin 7.5 mg PO x 1 tonight.    Despite INR remaining low, MD is not planning to bridge since AFib  indication, being in NSR, and advanced age.   Daily INR  CBC q72 hr  F/u for S/Sx bleeding or thrombosis  Gretta Arab [name], BCPS Pager [phone] 09/28/2015 11:20 AM

## 2015-09-29 LAB — CBC WITH DIFFERENTIAL/PLATELET
Basophils Absolute: 0 10*3/uL (ref 0.0–0.1)
Basophils Relative: 0 %
EOS ABS: 0.4 10*3/uL (ref 0.0–0.7)
Eosinophils Relative: 9 %
HCT: 28 % — ABNORMAL LOW (ref 36.0–46.0)
Hemoglobin: 8.4 g/dL — ABNORMAL LOW (ref 12.0–15.0)
LYMPHS PCT: 21 %
Lymphs Abs: 1 10*3/uL (ref 0.7–4.0)
MCH: 21.4 pg — ABNORMAL LOW (ref 26.0–34.0)
MCHC: 30 g/dL (ref 30.0–36.0)
MCV: 71.4 fL — ABNORMAL LOW (ref 78.0–100.0)
MONO ABS: 0.8 10*3/uL (ref 0.1–1.0)
Monocytes Relative: 18 %
NEUTROS PCT: 52 %
Neutro Abs: 2.5 10*3/uL (ref 1.7–7.7)
PLATELETS: 232 10*3/uL (ref 150–400)
RBC: 3.92 MIL/uL (ref 3.87–5.11)
RDW: 15.8 % — ABNORMAL HIGH (ref 11.5–15.5)
WBC: 4.7 10*3/uL (ref 4.0–10.5)

## 2015-09-29 LAB — BASIC METABOLIC PANEL
ANION GAP: 7 (ref 5–15)
BUN: 22 mg/dL — ABNORMAL HIGH (ref 6–20)
CO2: 30 mmol/L (ref 22–32)
Calcium: 8.7 mg/dL — ABNORMAL LOW (ref 8.9–10.3)
Chloride: 100 mmol/L — ABNORMAL LOW (ref 101–111)
Creatinine, Ser: 1.3 mg/dL — ABNORMAL HIGH (ref 0.44–1.00)
GFR calc Af Amer: 41 mL/min — ABNORMAL LOW (ref >60)
GFR, EST NON AFRICAN AMERICAN: 35 mL/min — AB (ref >60)
Glucose, Bld: 91 mg/dL (ref 65–99)
POTASSIUM: 3.9 mmol/L (ref 3.5–5.1)
SODIUM: 137 mmol/L (ref 135–145)

## 2015-09-29 LAB — PROTIME-INR
INR: 1.72
PROTHROMBIN TIME: 20.4 s — AB (ref 11.4–15.2)

## 2015-09-29 MED ORDER — HYDROMORPHONE HCL 1 MG/ML IJ SOLN
0.5000 mg | Freq: Four times a day (QID) | INTRAMUSCULAR | Status: DC | PRN
  Administered 2015-09-29 – 2015-09-30 (×2): 0.5 mg via INTRAVENOUS
  Filled 2015-09-29 (×2): qty 1

## 2015-09-29 MED ORDER — WARFARIN SODIUM 2.5 MG PO TABS
7.5000 mg | ORAL_TABLET | Freq: Once | ORAL | Status: AC
  Administered 2015-09-29: 7.5 mg via ORAL
  Filled 2015-09-29: qty 1

## 2015-09-29 MED ORDER — OXYCODONE HCL 5 MG PO TABS
5.0000 mg | ORAL_TABLET | Freq: Four times a day (QID) | ORAL | Status: DC | PRN
Start: 2015-09-29 — End: 2015-09-30
  Administered 2015-09-29 – 2015-09-30 (×3): 5 mg via ORAL
  Filled 2015-09-29 (×3): qty 1

## 2015-09-29 NOTE — Progress Notes (Signed)
[place] for warfarin Indication: atrial fibrillation  Allergies  Allergen Reactions  . Anti-Inflammatory Enzyme [Nutritional Supplements] Other (See Comments)    Retains fluids and headaches  . Iodinated Diagnostic Agents Hives    HIVES 15MIN S/P IV CONTRAST INJECTION,WILL NEED 13 HR PREP FOR FUTURE INJECTIONS, ok s/p 50mg  po benadryl//a.calhoun  . Spironolactone Other (See Comments)    Hair loss  . Arthrotec [Diclofenac-Misoprostol] Other (See Comments)    unknown  . Biaxin [Clarithromycin] Other (See Comments)    unknown  . Plavix [Clopidogrel Bisulfate] Other (See Comments)    unknown    Patient Measurements: Height: 5\' 4"  (162.6 cm) Weight: 164 lb 0.4 oz (74.4 kg) IBW/kg (Calculated) : 54.7   Vital Signs: Temp: 97.9 F (36.6 C) (09/03 0559) Temp Source: Oral (09/03 0559) BP: 121/71 (09/03 0559) Pulse Rate: 65 (09/03 0559)  Labs:  Recent Labs  09/27/15 0449 09/28/15 0457 09/29/15 0529  HGB 9.1* 8.7* 8.4*  HCT 30.3* 28.9* 28.0*  PLT 256 260 232  LABPROT 18.0* 18.5* 20.4*  INR 1.48 1.53 1.72  CREATININE 1.20* 1.06* 1.30*    Estimated Creatinine Clearance: 28.4 mL/min (by C-G formula based on SCr of 1.3 mg/dL).  Assessment: 73 yoF on chronic warfarin for A-fib.  INR therapeutic on admission, but warfarin was held for NPO status.  She is admitted with partial SBO, lovenox started while warfarin on hold.  Pharmacy is consulted to resume warfarin on 8/31. Home dose warfarin: 2.5mg  on Mon, Wed, Fri and 5mg  on all other days.  Last dose 8/28.  INR therapeutic at 2.2 on admission.  Today, 09/29/2015:  INR 1.72, increased but remains subtherapeutic  H/H low and slightly decreased, but appears fairly stable.  Plts WNL  No bleeding or complications reported.  Interacting medications: broad spectrum antibiotics, Zosyn, may increase INR.  SBO resolved, diet resumed and she is eating up to 100% of meals  Goal of Therapy:   INR 2-3   Plan:   Repeat boosted warfarin 7.5 mg PO x 1 more dose tonight.  MD is not planning to bridge for low INR since AFib indication, being in NSR, and advanced age.   Daily INR  CBC q72 hr  F/u for S/Sx bleeding or thrombosis  Gretta Arab [name], BCPS Pager [phone] 09/29/2015 9:55 AM

## 2015-09-29 NOTE — Progress Notes (Signed)
PROGRESS NOTE    [name]  [id number] DOB: 09/24/1923 DOA: 09/24/2015 PCP: [name], MD    Brief Narrative:  Small bowel obstruction, 82 yo female with recent colectomy with colostomy due to diverticulitis. Presented with hypokalemia and dehydration. Responding well to conservative care. Surgery on board. Positive bowel movements. K repletion. Found right lower lobe pneumonia complicated by hypoxemia, follow echocardiogram with worsening pericardial effusion. Spine imaging with no bony acute changes.      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 Patient tolerating po well. Continue antiacid therapy and as needed antiemetics.   2. AKI with Hypokalemia.  Will hold on torsemide for now due to worsening renal function per cr, up to 1.30. Will hold on IV fluids, patient tolerating po well. Will follow on renal function in am. K at 3,9 and Na at 137.   3. Chronic atrial fibrillation. Continue rate control with digoxin, verapamil, Heart rate persistent between60's bpm. Subtherapeutic INR up to 1,7 and rising, follow INR in am.   4. HTN. Blood pressure systolic [id number]. Will hold on torsemide due to worsening cr.   6. Chronic diastolic heart failure. Will plan to resume torsemide when grf stable. I called cardiology on call yesterday Dr Radford Pax. Echocardiogram images were reviewed, fluid collection located posteriorly, no hemodynamic compromise, plan to follow with primary cardiology in 7 days after discharge.    7. Back pain. Improved with scheduled acetaminophen, will decrease frequency of  hydromorphone, will add oxycodone, continue lidocaine patch, out of bed tid to chair with meals, continue physical therapy. Continue cyclobenzaprine and alprazolam.   8. Pneumonia ([facility]).  Improved dyspnea, will continue antibiotic therapy, oxymetry monitor and supplemental 02 per [place]. Persistent dyspnea on ambulation, will need oxymetry on room air before discharge for home 02 screen. Will continue duoneb for bronchodilator therapy. Continue zosyn IV.  Patient continue at moderate risk for worsening pain and hypoxemia.   DVT prophylaxis:warfarin Code Status:full  Family Communication:No family at the bedside Disposition Plan:Home   Consultants:   surgery   Procedures:     Antimicrobials:  Zosyn # 2  Subjective: Back pain has improved but still requiring IV narcotics, no chest pain. Dyspnea has improved, but is worse with ambulation, positive cough. No nausea or vomiting.   Objective: Vitals:   09/28/15 1153 09/28/15 1405 09/29/15 0559 09/29/15 1025  BP:  131/64 121/71 (!) 118/57  Pulse: 68 80 65 62  Resp:  20 20   Temp:  98 F (36.7 C) 97.9 F (36.6 C)   TempSrc:  Oral Oral   SpO2:  93% 96%   Weight:   74.4 kg (164 lb 0.4 oz)   Height:        Intake/Output Summary (Last 24 hours) at 09/29/15 1105 Last data filed at 09/29/15 0752  Gross per 24 hour  Intake             1110 ml  Output             1850 ml  Net             -740 ml   Filed  Weights   09/26/15 0607 09/28/15 0620 09/29/15 0559  Weight: 70.3 kg (154 lb 15.7 oz) 74.5 kg (164 lb 3.9 oz) 74.4 kg (164 lb 0.4 oz)    Examination:  General exam: deconditioned E ENT: mild conjunctial pallor, oral mucosa dry Respiratory system: . Respiratory effort normal. Scattered rhonchi and rales at bases. No significant wheezing. Cardiovascular system: S1 & S2 heard, RRR. No JVD, murmurs, rubs, gallops or clicks. Positive edema +++ lower extremities bilateral. Pitting.  Gastrointestinal system: Abdomen is  nondistended, soft and nontender. No organomegaly or masses felt. Normal bowel sounds heard. Colostomy bag in place.  Central nervous system: Alert and oriented. No focal neurological deficits. Extremities: Symmetric 5 x 5 power. Skin: No rashes, lesions or ulcers Psychiatry: Judgement and insight appear normal. Mood & affect appropriate.     Data Reviewed: I have personally reviewed following labs and imaging studies  CBC:  Recent Labs Lab 09/24/15 1839 09/25/15 0511 09/26/15 0459 09/27/15 0449 09/28/15 0457 09/29/15 0529  WBC 16.0* 11.7* 5.8 6.5 6.9 4.7  NEUTROABS 13.4*  --  4.1 3.9 4.8 2.5  HGB 10.1* 9.7* 8.1* 9.1* 8.7* 8.4*  HCT 33.5* 31.8* 27.1* 30.3* 28.9* 28.0*  MCV 74.0* 73.3* 73.2* 73.0* 73.5* 71.4*  PLT 358 289 211 256 260 [id number]   Basic Metabolic Panel:  Recent Labs Lab 09/24/15 2030 09/25/15 0511 09/26/15 0459 09/26/15 1747 09/27/15 0449 09/28/15 0457 09/29/15 0529  NA  --  142 139  --  135 136 137  K  --  3.9 2.8* 3.2* 3.4* 3.1* 3.9  CL  --  96* 100*  --  97* 97* 100*  CO2  --  37* 34*  --  31 30 30   GLUCOSE  --  132* 113*  --  105* 108* 91  BUN  --  48* 38*  --  29* 26* 22*  CREATININE  --  1.56* 1.41*  --  1.20* 1.06* 1.30*  CALCIUM  --  8.9 8.2*  --  8.9 8.8* 8.7*  MG 2.4  --  2.0  --   --   --   --    GFR: Estimated Creatinine Clearance: 28.4 mL/min (by C-G formula based on SCr of 1.3 mg/dL). Liver Function Tests:  Recent Labs Lab 09/24/15 1839  AST 34  ALT 24  ALKPHOS 102  BILITOT 0.6  PROT 8.7*  ALBUMIN 4.2    Recent Labs Lab 09/24/15 1839  LIPASE 53*   No results for input(s): AMMONIA in the last 168 hours. Coagulation Profile:  Recent Labs Lab 09/25/15 2125 09/26/15 0459 09/27/15 0449 09/28/15 0457 09/29/15 0529  INR 1.89 1.97 1.48 1.53 1.72   Cardiac Enzymes: No results for input(s): CKTOTAL, CKMB, CKMBINDEX, TROPONINI in the last 168 hours. BNP (last 3 results) No results for input(s): PROBNP in the last 8760  hours. HbA1C: No results for input(s): HGBA1C in the last 72 hours. CBG: No results for input(s): GLUCAP in the last 168 hours. Lipid Profile: No results for input(s): CHOL, HDL, LDLCALC, TRIG, CHOLHDL, LDLDIRECT in the last 72 hours. Thyroid Function Tests: No results for input(s): TSH, T4TOTAL, FREET4, T3FREE, THYROIDAB in the last 72 hours. Anemia Panel: No results for input(s): VITAMINB12, FOLATE, FERRITIN, TIBC, IRON, RETICCTPCT in the last 72 hours. Sepsis Labs:  Recent Labs Lab 09/24/15 1856  LATICACIDVEN 1.66    No results found for this or any previous visit (from the past 240 hour(s)).       Radiology Studies: Dg Chest 2 View  Result Date: 09/28/2015 CLINICAL DATA:  Shortness of breath. EXAM: CHEST  2 VIEW COMPARISON:  06/06/2015 FINDINGS: The pacer wires are stable. Stable cardiac enlargement and tortuous and calcified thoracic aorta. Chronic underlying lung disease with superimposed pleural effusions and bibasilar infiltrates. The bony thorax is intact. IMPRESSION: Stable cardiac enlargement. Bilateral pleural effusions and bibasilar infiltrates. Electronically Signed   By: Marijo Sanes M.D.   On: 09/28/2015 08:27   Ct Cervical Spine Wo Contrast  Result Date: 09/27/2015 CLINICAL DATA:  82 year old female with recent bowel surgery due to diverticulitis. Cervicothoracic pain radiating to the left shoulder. No known injury. Initial encounter. EXAM: CT CERVICAL SPINE WITHOUT CONTRAST TECHNIQUE: Multidetector CT imaging of the cervical spine was performed without intravenous contrast. Multiplanar CT image reconstructions were also generated. COMPARISON:  Neck radiographs 10/17/2011. FINDINGS: Study is intermittently, up to moderately degraded by motion artifact despite repeated imaging attempts. Osteopenia. Visualized skull base is intact. No atlanto-occipital dissociation. Straightening and mild reversal of cervical lordosis. Cervicothoracic junction alignment is within normal  limits. Bilateral posterior element alignment is within normal limits. No cervical spine fracture identified. Negative visualized posterior fossa. Calcified atherosclerosis at the skull base. Visible tympanic cavities and mastoids are clear. Mild for age calcified carotid atherosclerosis in the neck. Partially retropharyngeal course of the carotids. Otherwise negative noncontrast neck soft tissues. Negative lung apices. Chronic cervical spine disc and posterior element degeneration. Mild degenerative appearing anterolisthesis at C3-C4 and C4-C5. Still, there is only mild if any cervical spinal stenosis suspected, primarily at C5-C6. IMPRESSION: 1. Osteopenia. Motion degraded exam despite repeated imaging attempts. No acute osseous abnormality identified in the cervical spine. 2. Cervical spine degeneration, but only mild if any suspected cervical spinal stenosis. Electronically Signed   By: Genevie Ann M.D.   On: 09/27/2015 15:01   Ct Thoracic Spine Wo Contrast  Result Date: 09/27/2015 CLINICAL DATA:  82 year old female with recent bowel surgery due to diverticulitis. Cervicothoracic pain radiating to the left shoulder. No known injury. Initial encounter. EXAM: CT THORACIC SPINE WITHOUT CONTRAST TECHNIQUE: Multidetector CT imaging of the thoracic spine was performed without intravenous contrast administration. Multiplanar CT image reconstructions were also generated. COMPARISON:  Cervical spine CT from today reported separately. Thoracic spine radiographs 08/28/2015. Chest CT 07/04/2011. FINDINGS: Osteopenia. Normal thoracic segmentation. Stable mild exaggeration of thoracic kyphosis since 2013. Thoracic vertebrae appear intact. Posterior ribs appear intact. Visible upper lumbar levels appear intact. No CT evidence of thoracic spinal stenosis. Mild for age thoracic spine degeneration. Calcified aortic atherosclerosis. Cardiomegaly. Pericardial effusion not well visualized today. Small to moderate right and small left  layering pleural effusions. Negative visualized noncontrast upper abdominal viscera. Patchy and confluent left lower lobe pulmonary opacity, appears peribronchial and inflammatory in nature. IMPRESSION: 1. Osteopenia. No acute osseous abnormality identified. In the thoracic spine. Mild for age thoracic spine degeneration. 2. Confluent left lower lobe pulmonary opacity could reflect aspiration or pneumonia in this setting. 3. Moderate layering right and small layering left pleural effusions. 4.  Calcified aortic atherosclerosis. 5. Cardiomegaly. Large pericardial effusion better demonstrated on CT Abdomen and Pelvis 09/24/2015 (please see that report). Electronically Signed   By: Genevie Ann M.D.   On: 09/27/2015 15:07        Scheduled Meds: . acetaminophen  500 mg Oral Q6H  . digoxin  0.0625 mg Oral Daily  . feeding supplement (PRO-STAT SUGAR FREE 64)  30 mL Oral BID  . lidocaine  1 patch Transdermal Q24H  . pantoprazole  40 mg Oral BID  .  piperacillin-tazobactam (ZOSYN)  IV  3.375 g Intravenous Q8H  . polyethylene glycol  17 g Oral Daily  . verapamil  120 mg Oral Daily  . warfarin  7.5 mg Oral ONCE-1800  . Warfarin - Pharmacist Dosing Inpatient   Does not apply q1800   Continuous Infusions:    LOS: 5 days        Mikyla Schachter Gerome Apley, MD [facility] Pager [phone]  If 7PM-7AM, please contact night-coverage [email or web address] Password [email or web address] 09/29/2015, 11:05 AM

## 2015-09-30 LAB — CBC
HCT: 28.3 % — ABNORMAL LOW (ref 36.0–46.0)
Hemoglobin: 8.6 g/dL — ABNORMAL LOW (ref 12.0–15.0)
MCH: 21.9 pg — AB (ref 26.0–34.0)
MCHC: 30.4 g/dL (ref 30.0–36.0)
MCV: 72.2 fL — ABNORMAL LOW (ref 78.0–100.0)
PLATELETS: 241 10*3/uL (ref 150–400)
RBC: 3.92 MIL/uL (ref 3.87–5.11)
RDW: 15.9 % — ABNORMAL HIGH (ref 11.5–15.5)
WBC: 4.8 10*3/uL (ref 4.0–10.5)

## 2015-09-30 LAB — BASIC METABOLIC PANEL
ANION GAP: 6 (ref 5–15)
BUN: 17 mg/dL (ref 6–20)
CO2: 32 mmol/L (ref 22–32)
Calcium: 8.8 mg/dL — ABNORMAL LOW (ref 8.9–10.3)
Chloride: 102 mmol/L (ref 101–111)
Creatinine, Ser: 1.2 mg/dL — ABNORMAL HIGH (ref 0.44–1.00)
GFR calc Af Amer: 45 mL/min — ABNORMAL LOW (ref >60)
GFR, EST NON AFRICAN AMERICAN: 39 mL/min — AB (ref >60)
Glucose, Bld: 93 mg/dL (ref 65–99)
POTASSIUM: 3.8 mmol/L (ref 3.5–5.1)
SODIUM: 140 mmol/L (ref 135–145)

## 2015-09-30 LAB — PROTIME-INR
INR: 2.01
PROTHROMBIN TIME: 23.1 s — AB (ref 11.4–15.2)

## 2015-09-30 MED ORDER — WARFARIN SODIUM 2.5 MG PO TABS
2.5000 mg | ORAL_TABLET | Freq: Once | ORAL | Status: AC
  Administered 2015-09-30: 2.5 mg via ORAL
  Filled 2015-09-30: qty 1

## 2015-09-30 MED ORDER — ACETAMINOPHEN 500 MG PO TABS: 500.0000 mg | ORAL_TABLET | Freq: Three times a day (TID) | ORAL | 0 refills | Status: AC | PRN

## 2015-09-30 MED ORDER — PRO-STAT SUGAR FREE PO LIQD: 30.0000 mL | Freq: Two times a day (BID) | ORAL | 0 refills | Status: DC

## 2015-09-30 MED ORDER — AMOXICILLIN-POT CLAVULANATE 875-125 MG PO TABS: 1.0000 | ORAL_TABLET | Freq: Two times a day (BID) | ORAL | 0 refills | Status: DC

## 2015-09-30 MED ORDER — ALPRAZOLAM 0.25 MG PO TABS: 0.2500 mg | ORAL_TABLET | Freq: Three times a day (TID) | ORAL | 0 refills | Status: DC | PRN

## 2015-09-30 MED ORDER — CYCLOBENZAPRINE HCL 5 MG PO TABS: 5.0000 mg | ORAL_TABLET | Freq: Three times a day (TID) | ORAL | 0 refills | Status: DC | PRN

## 2015-09-30 MED ORDER — TORSEMIDE 20 MG PO TABS: 20.0000 mg | ORAL_TABLET | Freq: Every day | ORAL | 0 refills | Status: DC

## 2015-09-30 MED ORDER — WARFARIN SODIUM 2.5 MG PO TABS: 2.5000 mg | ORAL_TABLET | Freq: Once | ORAL | Status: DC

## 2015-09-30 MED ORDER — OXYCODONE HCL 5 MG PO TABS: 5.0000 mg | ORAL_TABLET | Freq: Four times a day (QID) | ORAL | 0 refills | Status: DC | PRN

## 2015-09-30 MED ORDER — LIDOCAINE 5 % EX PTCH: 1.0000 | MEDICATED_PATCH | CUTANEOUS | 0 refills | Status: AC

## 2015-09-30 NOTE — Progress Notes (Signed)
Pharmacy Antibiotic Note  [name] is a 82 y.o. female admitted on 09/24/2015 with 82 yo female with recent colectomy with colostomy due to diverticulitis. Presented with hypokalemia and dehydration, SBO.   Pharmacy has been consulted for zosyn dosing for aspiration pneumonia.   Plan:  Zosyn 3.375g IV Q8H infused over 4hrs for CrCl > 72mls/min  Follow renal function and clinical course  Recommend transition to Augmentin 500mg  PO q12h since tolerating POs to complete course   Height: 5\' 4"  (162.6 cm) Weight: 168 lb 14 oz (76.6 kg) IBW/kg (Calculated) : 54.7  Temp (24hrs), Avg:98.1 F (36.7 C), Min:97.9 F (36.6 C), Max:98.2 F (36.8 C)   Recent Labs Lab 09/24/15 1856  09/26/15 0459 09/27/15 0449 09/28/15 0457 09/29/15 0529 09/30/15 0429  WBC  --   < > 5.8 6.5 6.9 4.7 4.8  CREATININE  --   < > 1.41* 1.20* 1.06* 1.30* 1.20*  LATICACIDVEN 1.66  --   --   --   --   --   --   < > = values in this interval not displayed.  Estimated Creatinine Clearance: 31.2 mL/min (by C-G formula based on SCr of 1.2 mg/dL).    Allergies  Allergen Reactions  . Anti-Inflammatory Enzyme [Nutritional Supplements] Other (See Comments)    Retains fluids and headaches  . Iodinated Diagnostic Agents Hives    HIVES 15MIN S/P IV CONTRAST INJECTION,WILL NEED 13 HR PREP FOR FUTURE INJECTIONS, ok s/p 50mg  po benadryl//a.[place]  . Spironolactone Other (See Comments)    Hair loss  . Arthrotec [Diclofenac-Misoprostol] Other (See Comments)    unknown  . Biaxin [Clarithromycin] Other (See Comments)    unknown  . Plavix [Clopidogrel Bisulfate] Other (See Comments)    unknown    Antimicrobials this admission: 9/1 zosyn >>  Microbiology results: none  Thank you for allowing pharmacy to be a part of this patient's care.  Peggyann Juba, [name], BCPS Pager: [phone] 09/30/2015, 8:29 AM

## 2015-09-30 NOTE — Discharge Summary (Signed)
Physician Discharge Summary  COBIE PIZER [id number] DOB: 07/19/1923 DOA: 09/24/2015  PCP: [name], MD  Admit date: 09/24/2015 Discharge date: 09/30/2015  Admitted From:  Home Disposition:  Home   Recommendations for Outpatient Follow-up:  1. Follow up with PCP in 1-2 weeks 2. Please obtain BMP and INR in 72 hours.  Home Health: Yes   Equipment/Devices: No  Discharge Condition:Satble   CODE STATUS: Full Diet recommendation: Heart Healthy  Brief/Interim Summary: This is a 82 year old female who recently had a diverticulitis episode that required colostomy in May 2017. She did develop  acute abdominal pain, located in left flank severe in intensity associated with vomiting. She has been suffering from chronic neck and thoracic back pain being followed by her primary care physician Dr. Everlene Farrier. On the initial physical examination, she was afebrile, blood pressure 119/65, heart rate 79, temperature 97.5, respiratory rate 18, her lungs were clear to auscultation bilaterally without wheezing or rhonchi, heart S1-S2 present, with a positive systolic murmur 3/6, her abdomen had mild guarding, colostomy in place and no significant distention. She did have 3+ bilateral lower extremity edema up to knees. Her sodium was 139, potassium 4.0, creatinine 1.61, Bun  41, white count was 16.0, hemoglobin 10.1, hematocrit 3.5, platelet count 358. Her CT of the abdomen and pelvis shows small bowel obstruction due to peristomal herniation of small bowel just about the descending colostomy. Small bowel dilated abdomen to 4.1 cm, with stomach distention.   Patient was admitted to the hospital with working diagnosis of: Small bowel obstruction, complicated by acute kidney injury.  1. Small bowel obstruction. Patient was treated with conservative supportive care. NG tube was placed, surgery was consulted. She received IV fluids, analgesics and antiemetics. She responded well, she was able to move her bowels  appropriately. Surgery recommended not aggressive treatment to the parastomal hernia due to patient's advanced age and comorbidities unless it complicates into incarceration or strangulation.   2. Persistent neck and thoracic pain. Patient had further workup with CT of the cervical and thoracic spine, no acute osseous findings. Patient was treated with acetaminophen, oxycodone, lidocaine patch and flexeril. A very low-dose of alprazolam for anxiety. Patient was seen by physical therapy with recommendations for home health and home PT. By the time of discharge her pain seems to be more controlled. I did spoke with Dr. Everlene Farrier, about patient's back pain, he will follow-up as an outpatient.  3. Aspiration pneumonia (not present on admission). Patient developed hypoxic respiratory failure, further imaging suggested a right lower lobe infiltrate. She was placed on Zosyn intravenously with good response, she will be transitioned to Augmentin for 5 more days. She will have a ambulatory oximetry on room air before discharge.  4. Acute kidney injury and hypokalemia. Suspected to be prerenal. Patient had her diuretics held and received gentle hydration with IV fluids. She responded well with improvement of creatinine, her potassium was repleted. Her discharge creatinine is 1.20. Will recommend to continue diuretic therapy, torsemide has been decreased to 20 mg. Patient will need a basic metabolic panel within the next 3 days, to  follow-up on creatinine and electrolytes.  5. Pericardial effusion. Thoracic imaging reported large pericardial effusion, repeat echocardiogram showed a ejection fraction 50-55%, moderate posterior pericardial effusion, right ventricle with moderately dilated cavity and similarly dilated right atrium. I spoke with Dr. Radford Pax from cardiology, after reviewing the images of the echocardiogram, recommendations to follow-up with her primary care cardiologist in 1 week.   6. Diastolic heart failure.  Diuretics will be resumed by time of discharge. She'll need a close follow-up on kidney function and electrolytes. Continue digoxin and verapamil. Will hold further beta blockade and ACE inhibitors, until patient is more stable.  7. Chronic atrial fibrillation. Patient apparently has remained on a paced rhythm. Patient warfarin was dosed per pharmacy protocol. Discharge INR is 2.0. Will recommend follow-up INR in 72 hours.   Discharge Diagnoses:  Principal Problem:   AKI (acute kidney injury) ([place]) Active Problems:   Pericardial effusion, Large   Chronic atrial fibrillation (HCC)   Essential hypertension, benign   Protein-calorie malnutrition, moderate (HCC)   Chronic diastolic congestive heart failure (HCC)   Normocytic anemia   Hypokalemia   SBO (small bowel obstruction) (HCC)   Colostomy in place [facility])    Discharge Instructions  Discharge Instructions    Diet - low sodium heart healthy    Complete by:  As directed   Discharge instructions    Complete by:  As directed   Please follow next week with primary care, need to check renal function in 3 days.   Increase activity slowly    Complete by:  As directed       Medication List    TAKE these medications   acetaminophen 500 MG tablet Commonly known as:  TYLENOL Take 1 tablet (500 mg total) by mouth every 8 (eight) hours as needed for moderate pain.   ALPRAZolam 0.25 MG tablet Commonly known as:  XANAX Take 1 tablet (0.25 mg total) by mouth 3 (three) times daily as needed for anxiety.   amoxicillin-clavulanate 875-125 MG tablet Commonly known as:  AUGMENTIN Take 1 tablet by mouth 2 (two) times daily.   cyclobenzaprine 5 MG tablet Commonly known as:  FLEXERIL Take 1 tablet (5 mg total) by mouth 3 (three) times daily as needed for muscle spasms.   DIGITEK 0.125 MG tablet Generic drug:  digoxin Take 0.5 tablets by mouth as directed. Take 0.5 tablet 5 days a week   feeding supplement (PRO-STAT SUGAR FREE 64)  Liqd Take 30 mLs by mouth 2 (two) times daily.   lidocaine 5 % Commonly known as:  LIDODERM Place 1 patch onto the skin daily. Remove & Discard patch within 12 hours or as directed by MD   magnesium gluconate 500 MG tablet Commonly known as:  MAGONATE Take 1 tablet (500 mg total) by mouth daily.   metolazone 2.5 MG tablet Commonly known as:  ZAROXOLYN Take 1 tablet (2.5 mg total) by mouth 2 (two) times a week.   multivitamin with minerals Tabs tablet Take 1 tablet by mouth daily.   oxyCODONE 5 MG immediate release tablet Commonly known as:  Oxy IR/ROXICODONE Take 1 tablet (5 mg total) by mouth every 6 (six) hours as needed for breakthrough pain. What changed:  how much to take  how to take this  when to take this  reasons to take this  additional instructions   potassium chloride 10 MEQ tablet Commonly known as:  K-DUR Take 2 tablets (20 mEq total) by mouth daily. With torsemide   saccharomyces boulardii 250 MG capsule Commonly known as:  FLORASTOR Take 1 capsule (250 mg total) by mouth 2 (two) times daily.   torsemide 20 MG tablet Commonly known as:  DEMADEX Take 1 tablet (20 mg total) by mouth daily. What changed:  how much to take   verapamil 120 MG CR tablet Commonly known as:  CALAN-SR Take 1 tablet (120 mg total) by mouth daily.   Vitamin  D3 5000 units Caps Take 5,000 Units by mouth daily.   warfarin 5 MG tablet Commonly known as:  COUMADIN Take 1 tablet (5 mg total) by mouth daily at 6 PM. What changed:  additional instructions      Follow-up Information    [facility] .   Specialty:  [place] Why:  physical therapy Contact information: [address] [place] [id number] [phone]        DAUB, Richardson Landry A, MD Follow up in 1 week(s).   Specialty:  Family Medicine Contact information: [address] [place] [id number] [phone]          Allergies  Allergen Reactions  . Anti-Inflammatory Enzyme  [Nutritional Supplements] Other (See Comments)    Retains fluids and headaches  . Iodinated Diagnostic Agents Hives    HIVES 15MIN S/P IV CONTRAST INJECTION,WILL NEED 13 HR PREP FOR FUTURE INJECTIONS, ok s/p 50mg  po benadryl//a.calhoun  . Spironolactone Other (See Comments)    Hair loss  . Arthrotec [Diclofenac-Misoprostol] Other (See Comments)    unknown  . Biaxin [Clarithromycin] Other (See Comments)    unknown  . Plavix [Clopidogrel Bisulfate] Other (See Comments)    unknown    Consultations:  Surgery   Procedures/Studies: Dg Chest 2 View  Result Date: 09/28/2015 CLINICAL DATA:  Shortness of breath. EXAM: CHEST  2 VIEW COMPARISON:  06/06/2015 FINDINGS: The pacer wires are stable. Stable cardiac enlargement and tortuous and calcified thoracic aorta. Chronic underlying lung disease with superimposed pleural effusions and bibasilar infiltrates. The bony thorax is intact. IMPRESSION: Stable cardiac enlargement. Bilateral pleural effusions and bibasilar infiltrates. Electronically Signed   By: Marijo Sanes M.D.   On: 09/28/2015 08:27   Dg Abd 1 View  Result Date: 09/25/2015 CLINICAL DATA:  Bowel obstruction due to peristomal herniation of small bowel. EXAM: ABDOMEN - 1 VIEW COMPARISON:  CT scans dated 09/24/2015 and radiographs dated 06/11/2015 FINDINGS: The small bowel distention visible on the prior CT scan has resolved. The bowel gas pattern is normal. Colostomy in the left lower quadrant is noted. Aortic atherosclerosis.  Cholecystectomy. IMPRESSION: Resolution of small bowel obstruction. Aortic atherosclerosis. Electronically Signed   By: Lorriane Shire M.D.   On: 09/25/2015 09:39   Ct Cervical Spine Wo Contrast  Result Date: 09/27/2015 CLINICAL DATA:  82 year old female with recent bowel surgery due to diverticulitis. Cervicothoracic pain radiating to the left shoulder. No known injury. Initial encounter. EXAM: CT CERVICAL SPINE WITHOUT CONTRAST TECHNIQUE: Multidetector CT imaging of  the cervical spine was performed without intravenous contrast. Multiplanar CT image reconstructions were also generated. COMPARISON:  Neck radiographs 10/17/2011. FINDINGS: Study is intermittently, up to moderately degraded by motion artifact despite repeated imaging attempts. Osteopenia. Visualized skull base is intact. No atlanto-occipital dissociation. Straightening and mild reversal of cervical lordosis. Cervicothoracic junction alignment is within normal limits. Bilateral posterior element alignment is within normal limits. No cervical spine fracture identified. Negative visualized posterior fossa. Calcified atherosclerosis at the skull base. Visible tympanic cavities and mastoids are clear. Mild for age calcified carotid atherosclerosis in the neck. Partially retropharyngeal course of the carotids. Otherwise negative noncontrast neck soft tissues. Negative lung apices. Chronic cervical spine disc and posterior element degeneration. Mild degenerative appearing anterolisthesis at C3-C4 and C4-C5. Still, there is only mild if any cervical spinal stenosis suspected, primarily at C5-C6. IMPRESSION: 1. Osteopenia. Motion degraded exam despite repeated imaging attempts. No acute osseous abnormality identified in the cervical spine. 2. Cervical spine degeneration, but only mild if any suspected  cervical spinal stenosis. Electronically Signed   By: Genevie Ann M.D.   On: 09/27/2015 15:01   Ct Thoracic Spine Wo Contrast  Result Date: 09/27/2015 CLINICAL DATA:  82 year old female with recent bowel surgery due to diverticulitis. Cervicothoracic pain radiating to the left shoulder. No known injury. Initial encounter. EXAM: CT THORACIC SPINE WITHOUT CONTRAST TECHNIQUE: Multidetector CT imaging of the thoracic spine was performed without intravenous contrast administration. Multiplanar CT image reconstructions were also generated. COMPARISON:  Cervical spine CT from today reported separately. Thoracic spine radiographs  08/28/2015. Chest CT 07/04/2011. FINDINGS: Osteopenia. Normal thoracic segmentation. Stable mild exaggeration of thoracic kyphosis since 2013. Thoracic vertebrae appear intact. Posterior ribs appear intact. Visible upper lumbar levels appear intact. No CT evidence of thoracic spinal stenosis. Mild for age thoracic spine degeneration. Calcified aortic atherosclerosis. Cardiomegaly. Pericardial effusion not well visualized today. Small to moderate right and small left layering pleural effusions. Negative visualized noncontrast upper abdominal viscera. Patchy and confluent left lower lobe pulmonary opacity, appears peribronchial and inflammatory in nature. IMPRESSION: 1. Osteopenia. No acute osseous abnormality identified. In the thoracic spine. Mild for age thoracic spine degeneration. 2. Confluent left lower lobe pulmonary opacity could reflect aspiration or pneumonia in this setting. 3. Moderate layering right and small layering left pleural effusions. 4.  Calcified aortic atherosclerosis. 5. Cardiomegaly. Large pericardial effusion better demonstrated on CT Abdomen and Pelvis 09/24/2015 (please see that report). Electronically Signed   By: Genevie Ann M.D.   On: 09/27/2015 15:07   Ct Renal Stone Study  Result Date: 09/24/2015 CLINICAL DATA:  Acute on chronic back pain. Left flank pain. Leukocytosis. EXAM: CT ABDOMEN AND PELVIS WITHOUT CONTRAST TECHNIQUE: Multidetector CT imaging of the abdomen and pelvis was performed following the standard protocol without IV contrast. COMPARISON:  09/23/2015 FINDINGS: Lower chest: Large and possibly loculated pericardial effusion, similar to prior. Extensive coronary atherosclerosis. Calcified mitral valve. Pacer leads. Moderate cardiomegaly. Mild atelectasis in the right middle lobe and lingula. Mild atelectasis in both lower lobes. Hepatobiliary: Cholecystectomy Pancreas: Unremarkable Spleen: Unremarkable Adrenals/Urinary Tract: Abnormal appearance the kidneys with cortical  contrast remaining on board, but some clearing of the medullary contrast. As far as I am aware the patient received contrast 33 hours prior to today's scan, the persistent contrast in the cortex is indicative of renal dysfunction. There is some excreted contrast in the collecting systems in urinary bladder is well. Scarring in the kidneys, right greater than left. Bilateral renal cysts. Stomach/Bowel: Small bowel obstruction due to a peristomal hernia of small bowel, about the junction of the jejunum and ileum, extending above the descending colostomy site. Small bowel loops up to 4.1 cm. Distended stomach. Appendix normal. Rectal stump unremarkable. Vascular/Lymphatic: Aortoiliac atherosclerotic vascular disease. Scattered small retroperitoneal lymph nodes. Reproductive: Unremarkable Other: Diffuse subcutaneous and mesenteric edema, query generalized third spacing of fluid. Edema is most notable along the lower anterior pelvic wall and pubis region. Musculoskeletal: Lumbar spondylosis and degenerative disc disease. IMPRESSION: 1. Small bowel obstruction due to a peristomal herniation of small bowel just above the descending colostomy. Small bowel dilated up to 4.1 cm in the stomach is distended. 2. Abnormal appearance the kidneys, with retained contrast medium in the renal cortex bilaterally, cannot exclude acute renal failure or contrast nephropathy causing this appearance. The patient last received IV contrast about 33 hours prior to today's scan, as best I know. 3. Stable large and possibly loculated pericardial effusion with extensive coronary atherosclerosis, calcified mitral valve, and moderate cardiomegaly. 4.  Aortoiliac atherosclerotic vascular disease.  5. Third spacing of fluid with generalized mesenteric and subcutaneous edema. 6. I do not see any renal stones but sensitivity for nonobstructive stones is low due to the presence of contrast medium within the urinary tract left over from yesterday.  Electronically Signed   By: Van Clines M.D.   On: 09/24/2015 20:14       Subjective: Patient is feeling much better, her back pain is significantly improved, denies any chest pain or significant dyspnea. Positive bowel movements.  Discharge Exam: Vitals:   09/29/15 2200 09/30/15 0520  BP: (!) 134/59 (!) 123/58  Pulse: 61 62  Resp: 18 18  Temp: 98.2 F (36.8 C) 97.9 F (36.6 C)   Vitals:   09/29/15 1025 09/29/15 1414 09/29/15 2200 09/30/15 0520  BP: (!) 118/57 (!) 126/51 (!) 134/59 (!) 123/58  Pulse: 62 66 61 62  Resp:  20 18 18   Temp:  98.2 F (36.8 C) 98.2 F (36.8 C) 97.9 F (36.6 C)  TempSrc:  Oral Oral Oral  SpO2:  100% 99% 100%  Weight:    76.6 kg (168 lb 14 oz)  Height:        General: Pt is alert, awake, not in acute distress Cardiovascular: RRR, S1/S2 +, no rubs, no gallops, positive systolic murmur at the left parasternal region, 3/6.  Respiratory: CTA bilaterally, mild scattered wheezing, no significant rails Abdominal: Soft, NT, ND, bowel sounds + Extremities: Positive edema 3+ pitting up to the knees, no cyanosis    The results of significant diagnostics from this hospitalization (including imaging, microbiology, ancillary and laboratory) are listed below for reference.     Microbiology: No results found for this or any previous visit (from the past 240 hour(s)).   Labs: BNP (last 3 results)  Recent Labs  05/01/15 1545 07/29/15 1107 09/24/15 1839  BNP 398.4* 426.4* [id number]*   Basic Metabolic Panel:  Recent Labs Lab 09/24/15 2030  09/26/15 0459 09/26/15 1747 09/27/15 0449 09/28/15 0457 09/29/15 0529 09/30/15 0429  NA  --   < > 139  --  135 136 137 140  K  --   < > 2.8* 3.2* 3.4* 3.1* 3.9 3.8  CL  --   < > 100*  --  97* 97* 100* 102  CO2  --   < > 34*  --  31 30 30  32  GLUCOSE  --   < > 113*  --  105* 108* 91 93  BUN  --   < > 38*  --  29* 26* 22* 17  CREATININE  --   < > 1.41*  --  1.20* 1.06* 1.30* 1.20*  CALCIUM  --   < >  8.2*  --  8.9 8.8* 8.7* 8.8*  MG 2.4  --  2.0  --   --   --   --   --   < > = values in this interval not displayed. Liver Function Tests:  Recent Labs Lab 09/24/15 1839  AST 34  ALT 24  ALKPHOS 102  BILITOT 0.6  PROT 8.7*  ALBUMIN 4.2    Recent Labs Lab 09/24/15 1839  LIPASE 53*   No results for input(s): AMMONIA in the last 168 hours. CBC:  Recent Labs Lab 09/24/15 1839  09/26/15 0459 09/27/15 0449 09/28/15 0457 09/29/15 0529 09/30/15 0429  WBC 16.0*  < > 5.8 6.5 6.9 4.7 4.8  NEUTROABS 13.4*  --  4.1 3.9 4.8 2.5  --   HGB 10.1*  < > 8.1* 9.1*  8.7* 8.4* 8.6*  HCT 33.5*  < > 27.1* 30.3* 28.9* 28.0* 28.3*  MCV 74.0*  < > 73.2* 73.0* 73.5* 71.4* 72.2*  PLT 358  < > 211 256 260 232 241  < > = values in this interval not displayed. Cardiac Enzymes: No results for input(s): CKTOTAL, CKMB, CKMBINDEX, TROPONINI in the last 168 hours. BNP: Invalid input(s): POCBNP CBG: No results for input(s): GLUCAP in the last 168 hours. D-Dimer No results for input(s): DDIMER in the last 72 hours. Hgb A1c No results for input(s): HGBA1C in the last 72 hours. Lipid Profile No results for input(s): CHOL, HDL, LDLCALC, TRIG, CHOLHDL, LDLDIRECT in the last 72 hours. Thyroid function studies No results for input(s): TSH, T4TOTAL, T3FREE, THYROIDAB in the last 72 hours.  Invalid input(s): FREET3 Anemia work up No results for input(s): VITAMINB12, FOLATE, FERRITIN, TIBC, IRON, RETICCTPCT in the last 72 hours. Urinalysis    Component Value Date/Time   COLORURINE YELLOW 09/24/2015 2303   APPEARANCEUR CLEAR 09/24/2015 2303   LABSPEC 1.013 09/24/2015 2303   PHURINE 7.0 09/24/2015 2303   GLUCOSEU NEGATIVE 09/24/2015 2303   HGBUR NEGATIVE 09/24/2015 2303   BILIRUBINUR NEGATIVE 09/24/2015 2303   BILIRUBINUR negative 02/21/2015 1203   BILIRUBINUR Negative 07/17/2014 1207   KETONESUR NEGATIVE 09/24/2015 2303   PROTEINUR NEGATIVE 09/24/2015 2303   UROBILINOGEN 0.2 02/21/2015 1203    UROBILINOGEN 0.2 07/02/2011 0443   NITRITE NEGATIVE 09/24/2015 2303   LEUKOCYTESUR NEGATIVE 09/24/2015 2303   Sepsis Labs Invalid input(s): PROCALCITONIN,  WBC,  LACTICIDVEN Microbiology No results found for this or any previous visit (from the past 240 hour(s)).   Time coordinating discharge: 45 minutes  SIGNED:   Tawni Millers, MD  [facility] 09/30/2015, 7:54 AM Pager   If 7PM-7AM, please contact night-coverage [email or web address] Password [email or web address]

## 2015-09-30 NOTE — Progress Notes (Addendum)
Date: September 30, 2015 Discharge orders checked for needs. [email or web address]- to please start services of RN,HHA, and PT, spoke with [place]. [phone] faxed to cindy at [phone] Velva Harman, [place], [place], CCM   [phone]

## 2015-09-30 NOTE — Progress Notes (Signed)
[place] for warfarin Indication: atrial fibrillation  Allergies  Allergen Reactions  . Anti-Inflammatory Enzyme [Nutritional Supplements] Other (See Comments)    Retains fluids and headaches  . Iodinated Diagnostic Agents Hives    HIVES 15MIN S/P IV CONTRAST INJECTION,WILL NEED 13 HR PREP FOR FUTURE INJECTIONS, ok s/p 50mg  po benadryl//a.calhoun  . Spironolactone Other (See Comments)    Hair loss  . Arthrotec [Diclofenac-Misoprostol] Other (See Comments)    unknown  . Biaxin [Clarithromycin] Other (See Comments)    unknown  . Plavix [Clopidogrel Bisulfate] Other (See Comments)    unknown    Patient Measurements: Height: 5\' 4"  (162.6 cm) Weight: 168 lb 14 oz (76.6 kg) IBW/kg (Calculated) : 54.7   Vital Signs: Temp: 97.9 F (36.6 C) (09/04 0520) Temp Source: Oral (09/04 0520) BP: 123/58 (09/04 0520) Pulse Rate: 62 (09/04 0520)  Labs:  Recent Labs  09/28/15 0457 09/29/15 0529 09/30/15 0429  HGB 8.7* 8.4* 8.6*  HCT 28.9* 28.0* 28.3*  PLT 260 232 241  LABPROT 18.5* 20.4* 23.1*  INR 1.53 1.72 2.01  CREATININE 1.06* 1.30* 1.20*    Estimated Creatinine Clearance: 31.2 mL/min (by C-G formula based on SCr of 1.2 mg/dL).  Assessment: 76 yoF on chronic warfarin for A-fib.  INR therapeutic on admission, but warfarin was held for NPO status.  She is admitted with partial SBO, lovenox started while warfarin on hold.  Pharmacy is consulted to resume warfarin on 8/31. Home dose warfarin: 2.5mg  on Mon, Wed, Fri and 5mg  on all other days.  Last dose 8/28.  INR therapeutic at 2.2 on admission.  Today, 09/30/2015:  INR 2.01, now therapeutic  H/H low but stable.  Plts WNL  No bleeding or complications reported.  Interacting medications: broad spectrum antibiotics, Zosyn, may increase INR.  SBO resolved, tolerating ~50% of meals  Goal of Therapy:  INR 2-3   Plan:   Warfarin 2.5 mg PO x 1 per home regimen.  Daily INR  F/u for  S/Sx bleeding or thrombosis  Peggyann Juba, [name], BCPS Pager: [phone] 09/30/2015 8:25 AM

## 2015-10-01 ENCOUNTER — Telehealth: Payer: Self-pay

## 2015-10-01 NOTE — Telephone Encounter (Signed)
Patient's daughter is calling because the patient was in the hospital and will need [place] to send oxygen to [facility]. [name] stated that the patient has lots of edema and trouble breathing. Please advise!  [phone]

## 2015-10-02 NOTE — Telephone Encounter (Signed)
Call patient and tell her I would advise she call the cardiologist and see if she can get in in the next 48 hours. If not we are happy to see her at our office. I will be back in the office Friday. If she calls between 2 and 4:00 they will give her an appointment time for Friday. Please be sure oxygen is sent in.

## 2015-10-02 NOTE — Telephone Encounter (Signed)
Called [facility] and asked if they supply O2, which they don't, and if anything is needed from [place] for this pt. I was advised that the start of care RN was out to see the pt today. They will have the RN call if anything is needed at this time. I called daughter who reported that the pt's O2 sats were 88% yesterday, but they have improved. She would not qualify for O2 at this point. I gave her Dr Perfecto Kingdom inst's in case pt worsens again. Daughter agreed.

## 2015-10-07 ENCOUNTER — Telehealth: Payer: Self-pay | Admitting: Cardiovascular Disease

## 2015-10-07 ENCOUNTER — Ambulatory Visit: Payer: Self-pay | Admitting: Surgery

## 2015-10-07 NOTE — Telephone Encounter (Signed)
New message    Dr's office calling wanting an earlier appt than offered in Oct due to surgical clearance.   Request for surgical clearance:  1. What type of surgery is being performed?parastomel   2. When is this surgery scheduled? In 1-2 weeks  3. Are there any medications that need to be held prior to surgery and how long? Not sure  4. Name of physician performing surgery? Dr. Georgette Dover  5. What is your office phone and fax number?[phone], [phone]

## 2015-10-07 NOTE — H&P (Signed)
History of Present Illness [name]. [name] Prindiville MD; 10/07/2015 12:30 PM) The patient is a 82 year old female presenting for a post-operative visit. Status post urgent sigmoid colectomy with Hartman's procedure as well as a small bowel resection on 05/31/15. The patient had a pelvic abscess and a possible colovesical fistula. The patient was hospitalized for a week and then was transferred to inpatient rehabilitation. She has now transitioned to living with her sister but hopes to go home soon. She has caregivers that help her each day. Her appetite is much improved. She had decreased appetite for many months prior to her hospitalization, likely due to the smoldering diverticulitis. She has minimal pain. The only pain that she feels is around her colostomy site. Her midline incision is well-healed and does not seem to be bothering her at all. Her stools alternate from runny to firm depending on her diet. Her energy is much improved since before surgery.  Couple of weeks ago, she developed some swelling around her left-sided colostomy. She was hospitalized for a few days last week with what appeared to be obstruction secondary to a parastomal hernia. She resolved quickly and is back on a regular diet. She still has some mild discomfort around her colostomy. Otherwise she is doing quite well. She would like to have her colostomy reversed.   CLINICAL DATA: Acute on chronic back pain. Left flank pain. Leukocytosis.  EXAM: CT ABDOMEN AND PELVIS WITHOUT CONTRAST  TECHNIQUE: Multidetector CT imaging of the abdomen and pelvis was performed following the standard protocol without IV contrast.  COMPARISON: 09/23/2015  FINDINGS: Lower chest: Large and possibly loculated pericardial effusion, similar to prior. Extensive coronary atherosclerosis. Calcified mitral valve. Pacer leads. Moderate cardiomegaly. Mild atelectasis in the right middle lobe and lingula. Mild atelectasis in both  lower lobes.  Hepatobiliary: Cholecystectomy  Pancreas: Unremarkable  Spleen: Unremarkable  Adrenals/Urinary Tract: Abnormal appearance the kidneys with cortical contrast remaining on board, but some clearing of the medullary contrast. As far as I am aware the patient received contrast 33 hours prior to today's scan, the persistent contrast in the cortex is indicative of renal dysfunction. There is some excreted contrast in the collecting systems in urinary bladder is well.  Scarring in the kidneys, right greater than left. Bilateral renal cysts.  Stomach/Bowel: Small bowel obstruction due to a peristomal hernia of small bowel, about the junction of the jejunum and ileum, extending above the descending colostomy site. Small bowel loops up to 4.1 cm. Distended stomach. Appendix normal. Rectal stump unremarkable.  Vascular/Lymphatic: Aortoiliac atherosclerotic vascular disease. Scattered small retroperitoneal lymph nodes.  Reproductive: Unremarkable  Other: Diffuse subcutaneous and mesenteric edema, query generalized third spacing of fluid. Edema is most notable along the lower anterior pelvic wall and pubis region.  Musculoskeletal: Lumbar spondylosis and degenerative disc disease.  IMPRESSION: 1. Small bowel obstruction due to a peristomal herniation of small bowel just above the descending colostomy. Small bowel dilated up to 4.1 cm in the stomach is distended. 2. Abnormal appearance the kidneys, with retained contrast medium in the renal cortex bilaterally, cannot exclude acute renal failure or contrast nephropathy causing this appearance. The patient last received IV contrast about 33 hours prior to today's scan, as best I know. 3. Stable large and possibly loculated pericardial effusion with extensive coronary atherosclerosis, calcified mitral valve, and moderate cardiomegaly. 4. Aortoiliac atherosclerotic vascular disease. 5. Third spacing of fluid with  generalized mesenteric and subcutaneous edema. 6. I do not see any renal stones but sensitivity for nonobstructive stones  is low due to the presence of contrast medium within the urinary tract left over from yesterday.   Electronically Signed By: Van Clines M.D. On: 09/24/2015 20:14    Problem List/Past Medical [name] Petties, MD; 10/07/2015 12:30 PM) PARASTOMAL HERNIA (K43.5) PROLAPSED INTERNAL HEMORRHOIDS, GRADE 2 (K64.1) Patient Information following hemorrhoid banding Hemorrhoid banding is a procedure that places a small rubber band around a hemorrhoid, causing it to clot and then break off and be passed in the stool. This is a minor procedure, but problems may develop in rare cases. The following are warning symptoms and signs that should alert you to a possible complication. Please contact the office if any of these should occur: Increased pain with bowel movements or sitting Temperature over 100.4 F (oral) Bleeding that is excessive (over one cup of clots or blood) Redness or irritation outside the anus Difficulty urinating For your comfort please follow these instructions: Maintain a high fiber diet so that your bowel movements will be soft Take a fiber supplement twice a day (such as Metamucil, Benefiber, Citracel or Fibercon) Sit in a tub of warm water 2-3 times a day for the first 2-3 days, as needed to soothe the area. You may expect some pressure sensations in the anal area for 1-2 days If you need pain medication, take Tylenol not aspirin or Ibuprofen products. In 7-10 days, the banded tissue and rubber band will pass with your stool. Occasionally there is some bleeding after this. If this bleeding seems excessive, call the office immediately or go to the Emergency Room. Make an appointment to see me in 2-3 weeks after the procedure PERFORATION OF SIGMOID COLON DUE TO DIVERTICULITIS [id number])  Other Problems [name] Petties, MD; 10/07/2015 12:30 PM) Transfusion  history Hemorrhoids Heart murmur Pulmonary Embolism / Blood Clot in Legs Kidney Stone Atrial Fibrillation Diverticulosis Cerebrovascular Accident  Past Surgical History [name] Petties, MD; 10/07/2015 12:30 PM) Tonsillectomy Gallbladder Surgery - Laparoscopic Breast Biopsy Bilateral.  Diagnostic Studies History [name] Petties, MD; 10/07/2015 12:30 PM) Pap Smear 1-5 years ago Mammogram 1-3 years ago  Allergies Elbert Ewings, CMA; 10/07/2015 9:09 AM) Iodinated Glycerol *COUGH/COLD/ALLERGY* Anti-Inflammatory Enzyme *DIETARY PRODUCTS/DIETARY MANAGEMENT PRODUCTS* Arthrotec *ANALGESICS - ANTI-INFLAMMATORY* Biaxin *MACROLIDES* Plavix *HEMATOLOGICAL AGENTS - MISC.* Spironolactone Plus *DIURETICS*  Medication History [name] Petties, MD; 10/07/2015 12:30 PM) Tylenol (500MG  Capsule, Oral) Active. ALPRAZolam (0.25MG  Tablet, Oral three times daily) Active. Amoxicillin-Pot Clavulanate (875-125MG  Tablet, Oral) Active. Cyclobenzaprine HCl (5MG  Tablet, Oral three times daily) Active. Digoxin (125MCG Tablet, Oral) Active. Lidocaine (5% Ointment, External) Active. Magnesium (500MG  Tablet, Oral) Active. MetOLazone (2.5MG  Tablet, Oral) Active. Lanoxin (62.5MCG Tablet, Oral) Active. OxyCODONE HCl (5MG  Tablet, Oral) Active. Potassium Chloride Crys ER (10MEQ Tablet ER, Oral) Active. Florastor (250MG  Capsule, Oral) Active. Torsemide (20MG  Tablet, Oral) Active. Verapamil HCl ER (180MG  Tablet ER, Oral) Active. Warfarin Sodium (5MG  Tablet, Oral) Active. Medications Reconciled Pantoprazole Sodium (40MG  Tablet DR, Oral as needed) Active. Klor-Con M20 [facility] Tablet ER, Oral) Active. Verapamil HCl ER (120MG  Tablet ER, Oral) Active. Codeine Sulfate (15MG  Tablet, Oral) Active.  Social History [name] Petties, MD; 10/07/2015 12:30 PM) No drug use Alcohol use Occasional alcohol use. Tobacco use Never smoker.  Family History [name] Petties, MD; 10/07/2015  12:30 PM) Breast Cancer Mother, Sister. Colon Polyps Sister. Cerebrovascular Accident Mother. Heart Disease Mother, Sister. Hypertension Father.  Pregnancy / Birth History [name] Petties, MD; 10/07/2015 12:30 PM) Durenda Age 9 Age of menopause 43-50 Para 36 Maternal age 29-25 Age at menarche 89 years.  Vitals Elbert Ewings CMA; 10/07/2015 9:12 AM) 10/07/2015 9:12 AM Weight: 152 lb Height: 65in Body Surface Area: 1.76 m Body Mass Index: 25.29 kg/m  Temp.: 97.21F(Temporal)  Pulse: 64 (Regular)  BP: 116/72 (Sitting, Left Arm, Standard)      Physical Exam Rodman Key K. Anicka Stuckert MD; 10/07/2015 12:31 PM)  The physical exam findings are as follows: Note:WDWN in NAD Eyes: Pupils equal, round; sclera anicteric HENT: Oral mucosa moist; good dentition Neck: No masses palpated, no thyromegaly Lungs: CTA bilaterally; normal respiratory effort CV: Regular rate and rhythm; no murmurs; extremities well-perfused with no edema Abd: +bowel sounds, soft, non-tender, no palpable organomegaly; LLQ ostomy with slight palpable parastomal hernia - stool in bag; no prolapse Skin: Warm, dry; no sign of jaundice Psychiatric - alert and oriented x 4; calm mood and affect    Assessment & Plan Rodman Key K. Aneta Hendershott MD; 10/07/2015 9:42 AM)  PERFORATION OF SIGMOID COLON DUE TO DIVERTICULITIS (K57.20)  PARASTOMAL HERNIA (K43.5)  Current Plans Schedule for Surgery - colostomy reversal/ repair of parastomal hernia. The surgical procedure has been discussed with the patient. Potential risks, benefits, alternative treatments, and expected outcomes have been explained. All of the patient's questions at this time have been answered. The likelihood of reaching the patient's treatment goal is good. The patient understand the proposed surgical procedure and wishes to proceed.   Between now and the time of surgery, I would encourage the patient to eat plenty of protein. She should try to increase  her level of exercise with daily walks. She should try to keep her bowel movements are relatively loose. She can massage the area around the stoma to make sure that there is not a protruding parastomal hernia. If this area begins to swell, please call us and let us know.  Hold your Coumadin 5 days before surgery. The day before surgery, you will need to follow the instructions for the MiraLAX bowel prep.  [name]. Georgette Dover, MD, [facility]  General/ Trauma Surgery  10/07/2015 12:31 PM

## 2015-10-08 ENCOUNTER — Inpatient Hospital Stay (HOSPITAL_COMMUNITY)
Admission: EM | Admit: 2015-10-08 | Discharge: 2015-10-21 | DRG: 480 | Disposition: A | Discharge status: Hospice | Payer: Medicare Other | Attending: Internal Medicine | Admitting: Internal Medicine

## 2015-10-08 ENCOUNTER — Emergency Department (HOSPITAL_COMMUNITY): Payer: Medicare Other

## 2015-10-08 ENCOUNTER — Encounter (HOSPITAL_COMMUNITY): Payer: Self-pay | Admitting: Emergency Medicine

## 2015-10-08 DIAGNOSIS — G9340 Encephalopathy, unspecified: Secondary | ICD-10-CM

## 2015-10-08 DIAGNOSIS — N179 Acute kidney failure, unspecified: Secondary | ICD-10-CM | POA: Diagnosis present

## 2015-10-08 DIAGNOSIS — I130 Hypertensive heart and chronic kidney disease with heart failure and stage 1 through stage 4 chronic kidney disease, or unspecified chronic kidney disease: Secondary | ICD-10-CM | POA: Diagnosis present

## 2015-10-08 DIAGNOSIS — W19XXXS Unspecified fall, sequela: Secondary | ICD-10-CM | POA: Diagnosis not present

## 2015-10-08 DIAGNOSIS — R0602 Shortness of breath: Secondary | ICD-10-CM

## 2015-10-08 DIAGNOSIS — J9621 Acute and chronic respiratory failure with hypoxia: Secondary | ICD-10-CM | POA: Diagnosis not present

## 2015-10-08 DIAGNOSIS — E876 Hypokalemia: Secondary | ICD-10-CM | POA: Diagnosis present

## 2015-10-08 DIAGNOSIS — I4891 Unspecified atrial fibrillation: Secondary | ICD-10-CM

## 2015-10-08 DIAGNOSIS — S72002A Fracture of unspecified part of neck of left femur, initial encounter for closed fracture: Secondary | ICD-10-CM | POA: Diagnosis not present

## 2015-10-08 DIAGNOSIS — Z86718 Personal history of other venous thrombosis and embolism: Secondary | ICD-10-CM

## 2015-10-08 DIAGNOSIS — I480 Paroxysmal atrial fibrillation: Secondary | ICD-10-CM | POA: Diagnosis not present

## 2015-10-08 DIAGNOSIS — Z66 Do not resuscitate: Secondary | ICD-10-CM | POA: Diagnosis not present

## 2015-10-08 DIAGNOSIS — I272 Other secondary pulmonary hypertension: Secondary | ICD-10-CM | POA: Diagnosis present

## 2015-10-08 DIAGNOSIS — S72142A Displaced intertrochanteric fracture of left femur, initial encounter for closed fracture: Secondary | ICD-10-CM | POA: Diagnosis present

## 2015-10-08 DIAGNOSIS — R262 Difficulty in walking, not elsewhere classified: Secondary | ICD-10-CM | POA: Diagnosis not present

## 2015-10-08 DIAGNOSIS — G9341 Metabolic encephalopathy: Secondary | ICD-10-CM | POA: Diagnosis not present

## 2015-10-08 DIAGNOSIS — Z7901 Long term (current) use of anticoagulants: Secondary | ICD-10-CM

## 2015-10-08 DIAGNOSIS — I639 Cerebral infarction, unspecified: Secondary | ICD-10-CM | POA: Diagnosis not present

## 2015-10-08 DIAGNOSIS — D631 Anemia in chronic kidney disease: Secondary | ICD-10-CM | POA: Diagnosis present

## 2015-10-08 DIAGNOSIS — I509 Heart failure, unspecified: Secondary | ICD-10-CM

## 2015-10-08 DIAGNOSIS — I482 Chronic atrial fibrillation: Secondary | ICD-10-CM | POA: Diagnosis not present

## 2015-10-08 DIAGNOSIS — M79602 Pain in left arm: Secondary | ICD-10-CM | POA: Diagnosis not present

## 2015-10-08 DIAGNOSIS — S72002S Fracture of unspecified part of neck of left femur, sequela: Secondary | ICD-10-CM | POA: Diagnosis not present

## 2015-10-08 DIAGNOSIS — Z79899 Other long term (current) drug therapy: Secondary | ICD-10-CM

## 2015-10-08 DIAGNOSIS — M25552 Pain in left hip: Secondary | ICD-10-CM | POA: Diagnosis present

## 2015-10-08 DIAGNOSIS — F411 Generalized anxiety disorder: Secondary | ICD-10-CM | POA: Diagnosis not present

## 2015-10-08 DIAGNOSIS — Z8673 Personal history of transient ischemic attack (TIA), and cerebral infarction without residual deficits: Secondary | ICD-10-CM

## 2015-10-08 DIAGNOSIS — Z7189 Other specified counseling: Secondary | ICD-10-CM | POA: Diagnosis not present

## 2015-10-08 DIAGNOSIS — M79605 Pain in left leg: Secondary | ICD-10-CM

## 2015-10-08 DIAGNOSIS — D649 Anemia, unspecified: Secondary | ICD-10-CM | POA: Diagnosis not present

## 2015-10-08 DIAGNOSIS — I071 Rheumatic tricuspid insufficiency: Secondary | ICD-10-CM | POA: Diagnosis present

## 2015-10-08 DIAGNOSIS — Z933 Colostomy status: Secondary | ICD-10-CM | POA: Diagnosis not present

## 2015-10-08 DIAGNOSIS — I350 Nonrheumatic aortic (valve) stenosis: Secondary | ICD-10-CM | POA: Diagnosis present

## 2015-10-08 DIAGNOSIS — W19XXXA Unspecified fall, initial encounter: Secondary | ICD-10-CM | POA: Diagnosis not present

## 2015-10-08 DIAGNOSIS — F419 Anxiety disorder, unspecified: Secondary | ICD-10-CM | POA: Diagnosis not present

## 2015-10-08 DIAGNOSIS — I5033 Acute on chronic diastolic (congestive) heart failure: Secondary | ICD-10-CM | POA: Diagnosis not present

## 2015-10-08 DIAGNOSIS — N183 Chronic kidney disease, stage 3 (moderate): Secondary | ICD-10-CM | POA: Diagnosis present

## 2015-10-08 DIAGNOSIS — G8194 Hemiplegia, unspecified affecting left nondominant side: Secondary | ICD-10-CM | POA: Diagnosis present

## 2015-10-08 DIAGNOSIS — W010XXA Fall on same level from slipping, tripping and stumbling without subsequent striking against object, initial encounter: Secondary | ICD-10-CM | POA: Diagnosis present

## 2015-10-08 DIAGNOSIS — Z950 Presence of cardiac pacemaker: Secondary | ICD-10-CM | POA: Diagnosis present

## 2015-10-08 DIAGNOSIS — Z515 Encounter for palliative care: Secondary | ICD-10-CM | POA: Diagnosis not present

## 2015-10-08 DIAGNOSIS — S72002D Fracture of unspecified part of neck of left femur, subsequent encounter for closed fracture with routine healing: Secondary | ICD-10-CM | POA: Diagnosis not present

## 2015-10-08 DIAGNOSIS — I5032 Chronic diastolic (congestive) heart failure: Secondary | ICD-10-CM | POA: Diagnosis not present

## 2015-10-08 DIAGNOSIS — D638 Anemia in other chronic diseases classified elsewhere: Secondary | ICD-10-CM | POA: Diagnosis not present

## 2015-10-08 DIAGNOSIS — Z419 Encounter for procedure for purposes other than remedying health state, unspecified: Secondary | ICD-10-CM

## 2015-10-08 DIAGNOSIS — M79606 Pain in leg, unspecified: Secondary | ICD-10-CM

## 2015-10-08 DIAGNOSIS — Z23 Encounter for immunization: Secondary | ICD-10-CM

## 2015-10-08 DIAGNOSIS — Z8669 Personal history of other diseases of the nervous system and sense organs: Secondary | ICD-10-CM

## 2015-10-08 DIAGNOSIS — L98429 Non-pressure chronic ulcer of back with unspecified severity: Secondary | ICD-10-CM | POA: Diagnosis not present

## 2015-10-08 HISTORY — DX: Fracture of unspecified part of neck of left femur, initial encounter for closed fracture: S72.002A

## 2015-10-08 LAB — CBC WITH DIFFERENTIAL/PLATELET
BASOS ABS: 0.1 10*3/uL (ref 0.0–0.1)
Basophils Relative: 1 %
EOS ABS: 0.1 10*3/uL (ref 0.0–0.7)
Eosinophils Relative: 2 %
HCT: 30.6 % — ABNORMAL LOW (ref 36.0–46.0)
HEMOGLOBIN: 9.4 g/dL — AB (ref 12.0–15.0)
LYMPHS PCT: 18 %
Lymphs Abs: 1 10*3/uL (ref 0.7–4.0)
MCH: 21.6 pg — ABNORMAL LOW (ref 26.0–34.0)
MCHC: 30.7 g/dL (ref 30.0–36.0)
MCV: 70.3 fL — ABNORMAL LOW (ref 78.0–100.0)
Monocytes Absolute: 0.5 10*3/uL (ref 0.1–1.0)
Monocytes Relative: 9 %
NEUTROS ABS: 4 10*3/uL (ref 1.7–7.7)
NEUTROS PCT: 70 %
Platelets: 253 10*3/uL (ref 150–400)
RBC: 4.35 MIL/uL (ref 3.87–5.11)
RDW: 16.4 % — ABNORMAL HIGH (ref 11.5–15.5)
WBC: 5.7 10*3/uL (ref 4.0–10.5)

## 2015-10-08 LAB — I-STAT CHEM 8, ED
BUN: 21 mg/dL — ABNORMAL HIGH (ref 6–20)
CALCIUM ION: 1.05 mmol/L — AB (ref 1.15–1.40)
CHLORIDE: 91 mmol/L — AB (ref 101–111)
Creatinine, Ser: 1.5 mg/dL — ABNORMAL HIGH (ref 0.44–1.00)
Glucose, Bld: 123 mg/dL — ABNORMAL HIGH (ref 65–99)
HEMATOCRIT: 31 % — AB (ref 36.0–46.0)
Hemoglobin: 10.5 g/dL — ABNORMAL LOW (ref 12.0–15.0)
Potassium: 2.3 mmol/L — CL (ref 3.5–5.1)
SODIUM: 139 mmol/L (ref 135–145)
TCO2: 32 mmol/L (ref 0–100)

## 2015-10-08 LAB — MAGNESIUM
Magnesium: 1.7 mg/dL (ref 1.7–2.4)
Magnesium: 1.9 mg/dL (ref 1.7–2.4)

## 2015-10-08 LAB — BASIC METABOLIC PANEL
Anion gap: 10 (ref 5–15)
BUN: 18 mg/dL (ref 6–20)
CO2: 33 mmol/L — ABNORMAL HIGH (ref 22–32)
Calcium: 8.9 mg/dL (ref 8.9–10.3)
Chloride: 93 mmol/L — ABNORMAL LOW (ref 101–111)
Creatinine, Ser: 1.39 mg/dL — ABNORMAL HIGH (ref 0.44–1.00)
GFR calc Af Amer: 37 mL/min — ABNORMAL LOW (ref >60)
GFR, EST NON AFRICAN AMERICAN: 32 mL/min — AB (ref >60)
GLUCOSE: 160 mg/dL — AB (ref 65–99)
POTASSIUM: 2.6 mmol/L — AB (ref 3.5–5.1)
Sodium: 136 mmol/L (ref 135–145)

## 2015-10-08 LAB — PROTIME-INR
INR: 2.9
PROTHROMBIN TIME: 31 s — AB (ref 11.4–15.2)

## 2015-10-08 LAB — MRSA PCR SCREENING: MRSA BY PCR: POSITIVE — AB

## 2015-10-08 LAB — POTASSIUM: Potassium: 2.2 mmol/L — CL (ref 3.5–5.1)

## 2015-10-08 MED ORDER — ALPRAZOLAM 0.25 MG PO TABS
0.2500 mg | ORAL_TABLET | Freq: Three times a day (TID) | ORAL | Status: DC | PRN
  Administered 2015-10-10 – 2015-10-15 (×3): 0.25 mg via ORAL
  Filled 2015-10-08 (×5): qty 1

## 2015-10-08 MED ORDER — SODIUM CHLORIDE 0.9 % IV SOLN
Freq: Once | INTRAVENOUS | Status: AC
  Administered 2015-10-08: 11:00:00 via INTRAVENOUS

## 2015-10-08 MED ORDER — CEFAZOLIN SODIUM-DEXTROSE 2-4 GM/100ML-% IV SOLN
2.0000 g | INTRAVENOUS | Status: DC
  Filled 2015-10-08: qty 100

## 2015-10-08 MED ORDER — INFLUENZA VAC SPLIT QUAD 0.5 ML IM SUSY
0.5000 mL | PREFILLED_SYRINGE | INTRAMUSCULAR | Status: AC
  Administered 2015-10-10: 0.5 mL via INTRAMUSCULAR
  Filled 2015-10-08 (×2): qty 0.5

## 2015-10-08 MED ORDER — POTASSIUM CHLORIDE 10 MEQ/100ML IV SOLN
10.0000 meq | INTRAVENOUS | Status: AC
  Administered 2015-10-08 (×3): 10 meq via INTRAVENOUS
  Filled 2015-10-08 (×3): qty 100

## 2015-10-08 MED ORDER — HYDROMORPHONE HCL 1 MG/ML IJ SOLN
0.5000 mg | INTRAMUSCULAR | Status: DC | PRN
  Administered 2015-10-08 – 2015-10-15 (×20): 0.5 mg via INTRAVENOUS
  Filled 2015-10-08 (×21): qty 1

## 2015-10-08 MED ORDER — VERAPAMIL HCL ER 120 MG PO TBCR
120.0000 mg | EXTENDED_RELEASE_TABLET | Freq: Every day | ORAL | Status: DC
  Administered 2015-10-08 – 2015-10-11 (×4): 120 mg via ORAL
  Filled 2015-10-08 (×5): qty 1

## 2015-10-08 MED ORDER — POTASSIUM CHLORIDE CRYS ER 20 MEQ PO TBCR
40.0000 meq | EXTENDED_RELEASE_TABLET | Freq: Once | ORAL | Status: AC
  Administered 2015-10-08: 40 meq via ORAL
  Filled 2015-10-08: qty 2

## 2015-10-08 MED ORDER — METOLAZONE 2.5 MG PO TABS
2.5000 mg | ORAL_TABLET | ORAL | Status: DC
  Administered 2015-10-10: 2.5 mg via ORAL
  Filled 2015-10-08: qty 1

## 2015-10-08 MED ORDER — FENTANYL CITRATE (PF) 100 MCG/2ML IJ SOLN
50.0000 ug | INTRAMUSCULAR | Status: DC | PRN
  Administered 2015-10-08: 50 ug via INTRAVENOUS
  Filled 2015-10-08: qty 2

## 2015-10-08 MED ORDER — POTASSIUM CHLORIDE 10 MEQ/100ML IV SOLN
10.0000 meq | INTRAVENOUS | Status: AC
  Administered 2015-10-08 – 2015-10-09 (×2): 10 meq via INTRAVENOUS
  Filled 2015-10-08 (×2): qty 100

## 2015-10-08 MED ORDER — CYCLOBENZAPRINE HCL 5 MG PO TABS
5.0000 mg | ORAL_TABLET | Freq: Three times a day (TID) | ORAL | Status: DC | PRN
  Administered 2015-10-14 – 2015-10-15 (×2): 5 mg via ORAL
  Filled 2015-10-08 (×3): qty 1

## 2015-10-08 MED ORDER — CALCIUM GLUCONATE 10 % IV SOLN
1.0000 g | Freq: Once | INTRAVENOUS | Status: AC
  Administered 2015-10-08: 1 g via INTRAVENOUS
  Filled 2015-10-08 (×2): qty 10

## 2015-10-08 MED ORDER — POTASSIUM CHLORIDE 10 MEQ/100ML IV SOLN
10.0000 meq | INTRAVENOUS | Status: AC
  Administered 2015-10-08 (×2): 10 meq via INTRAVENOUS
  Filled 2015-10-08: qty 100

## 2015-10-08 MED ORDER — FENTANYL CITRATE (PF) 100 MCG/2ML IJ SOLN
50.0000 ug | Freq: Once | INTRAMUSCULAR | Status: AC
  Administered 2015-10-08: 50 ug via INTRAVENOUS
  Filled 2015-10-08: qty 2

## 2015-10-08 MED ORDER — SODIUM CHLORIDE 0.9 % IV SOLN
INTRAVENOUS | Status: DC
  Administered 2015-10-08: 09:00:00 via INTRAVENOUS

## 2015-10-08 MED ORDER — SACCHAROMYCES BOULARDII 250 MG PO CAPS
250.0000 mg | ORAL_CAPSULE | Freq: Two times a day (BID) | ORAL | Status: DC
  Administered 2015-10-08 – 2015-10-15 (×6): 250 mg via ORAL
  Filled 2015-10-08 (×8): qty 1

## 2015-10-08 MED ORDER — DIGOXIN 0.0625 MG HALF TABLET
62.5000 ug | ORAL_TABLET | ORAL | Status: DC
  Filled 2015-10-08: qty 1

## 2015-10-08 MED ORDER — POVIDONE-IODINE 10 % EX SWAB
2.0000 "application " | Freq: Once | CUTANEOUS | Status: AC
  Administered 2015-10-09: 2 via TOPICAL

## 2015-10-08 MED ORDER — VITAMIN K1 10 MG/ML IJ SOLN
10.0000 mg | Freq: Two times a day (BID) | INTRAVENOUS | Status: AC
  Administered 2015-10-08 (×2): 10 mg via INTRAVENOUS
  Filled 2015-10-08 (×2): qty 1

## 2015-10-08 MED ORDER — MAGNESIUM SULFATE IN D5W 1-5 GM/100ML-% IV SOLN
1.0000 g | Freq: Once | INTRAVENOUS | Status: AC
  Administered 2015-10-08: 1 g via INTRAVENOUS
  Filled 2015-10-08: qty 100

## 2015-10-08 MED ORDER — POTASSIUM CHLORIDE CRYS ER 20 MEQ PO TBCR
80.0000 meq | EXTENDED_RELEASE_TABLET | Freq: Once | ORAL | Status: AC
  Administered 2015-10-08: 80 meq via ORAL
  Filled 2015-10-08: qty 4

## 2015-10-08 MED ORDER — MAGNESIUM GLUCONATE 500 MG PO TABS
500.0000 mg | ORAL_TABLET | Freq: Every day | ORAL | Status: DC
  Administered 2015-10-08 – 2015-10-15 (×4): 500 mg via ORAL
  Filled 2015-10-08 (×10): qty 1

## 2015-10-08 MED ORDER — LIDOCAINE 5 % EX PTCH
1.0000 | MEDICATED_PATCH | CUTANEOUS | Status: DC
  Administered 2015-10-09 – 2015-10-21 (×13): 1 via TRANSDERMAL
  Filled 2015-10-08 (×14): qty 1

## 2015-10-08 MED ORDER — OXYCODONE HCL 5 MG PO TABS
5.0000 mg | ORAL_TABLET | Freq: Four times a day (QID) | ORAL | Status: DC | PRN
  Administered 2015-10-08 – 2015-10-09 (×3): 5 mg via ORAL
  Filled 2015-10-08 (×3): qty 1

## 2015-10-08 MED ORDER — POTASSIUM CHLORIDE CRYS ER 10 MEQ PO TBCR
20.0000 meq | EXTENDED_RELEASE_TABLET | Freq: Every day | ORAL | Status: DC
  Administered 2015-10-08 – 2015-10-10 (×3): 20 meq via ORAL
  Filled 2015-10-08 (×12): qty 2

## 2015-10-08 MED ORDER — DIGOXIN 125 MCG PO TABS
0.0625 mg | ORAL_TABLET | ORAL | Status: DC
  Administered 2015-10-08 – 2015-10-10 (×3): 0.0625 mg via ORAL
  Filled 2015-10-08 (×2): qty 1

## 2015-10-08 NOTE — Telephone Encounter (Signed)
I called the surgery office to clarify what procedure is being performed. I'm not sure what "parastomel" means. I spoke with the nurse there and left a message, I have not heard back from them. Could try one more time to find out what they're planning to do? Maybe get Dr. Vonna Kotyk last note? Thank you MCr

## 2015-10-08 NOTE — ED Triage Notes (Signed)
Patient here from home with complaints of fall today. Left hip pain and left arm pain. 200 Fent given.

## 2015-10-08 NOTE — Consult Note (Signed)
ORTHOPAEDIC CONSULTATION  REQUESTING PHYSICIAN: Modena Jansky, MD  Chief Complaint: Left hip fracture  HPI: [name] is a 82 y.o. female who presents with left hip fracture s/p mechanical fall PTA.  The patient endorses severe pain in the left hip, that does not radiate, grinding in quality, worse with any movement, better with immobilization.  Denies LOC/fever/chills/nausea/vomiting.  Walks with assistive devices (walker, cane, wheelchair).  Does live independently with family member.  Denies LOC, neck pain, abd pain.  Past Medical History:  Diagnosis Date  . Anemia   . Arthritis   . Chronic atrial fibrillation (HCC)    a. on Coumadin, Digoxin, and Verapamil  . Chronic diastolic (congestive) heart failure ([place])    a. Echo 01/2015 with EF of 55-60%.  . History of GI bleed    a. secondary to AVM's. Treated with Fe infusion  . History of shingles   . HTN (hypertension)   . LVH (left ventricular hypertrophy)   . Obesity   . Pericardial effusion    a. s/p pericardial window 01/16/15  . Positive TB test   . Sleep apnea    mild-no cpap  . Stroke [facility])    a. 2013: right frontal  . Tachycardia-bradycardia syndrome [facility])    a. s/p Medtronic [place], model number [id number], serial number [id number] H 12/2013   Past Surgical History:  Procedure Laterality Date  . BOWEL RESECTION N/A 05/31/2015   Procedure: SMALL BOWEL RESECTION;  Surgeon: Donnie Mesa, MD;  Location: [place];  Service: General;  Laterality: N/A;  . BREAST LUMPECTOMY Bilateral    negative for cancer  . CATARACT EXTRACTION W/ INTRAOCULAR LENS  IMPLANT, BILATERAL Bilateral   . CHOLECYSTECTOMY  2005  . COLON RESECTION N/A 05/31/2015   Procedure: SIGMOID COLECTOMY;  Surgeon: Donnie Mesa, MD;  Location: [place];  Service: General;  Laterality: N/A;  . COLONOSCOPY  08/11/2011   Procedure: COLONOSCOPY;  Surgeon: Jeryl Columbia, MD;  Location: [facility] ENDOSCOPY;  Service: Endoscopy;  Laterality: N/A;  . COLOSTOMY N/A 05/31/2015     Procedure: DESCENDING COLOSTOMY;  Surgeon: Donnie Mesa, MD;  Location: [place];  Service: General;  Laterality: N/A;  . DEBRIDEMENT OF ABDOMINAL WALL ABSCESS N/A 05/31/2015   Procedure: DRAINAGE OF PELVIC ABSCESS;  Surgeon: Donnie Mesa, MD;  Location: [place];  Service: General;  Laterality: N/A;  . ESOPHAGOGASTRODUODENOSCOPY  05/19/2011   Procedure: ESOPHAGOGASTRODUODENOSCOPY (EGD);  Surgeon: Lear Ng, MD;  Location: [facility] ENDOSCOPY;  Service: Endoscopy;  Laterality: N/A;  doctor aware of inr   will try to be here no later than 230  . ESOPHAGOGASTRODUODENOSCOPY  06/22/2011   Procedure: ESOPHAGOGASTRODUODENOSCOPY (EGD);  Surgeon: Arta Silence, MD;  [facility] ENDOSCOPY;  Service: Endoscopy;  Laterality: N/A;  Check PT/INR in am  . GIVENS CAPSULE STUDY  06/23/2011   Procedure: GIVENS CAPSULE STUDY;  Surgeon: Arta Silence, MD;  Location: [facility] ENDOSCOPY;  Service: Endoscopy;  Laterality: N/A;  . HOT HEMOSTASIS  08/11/2011   Procedure: HOT HEMOSTASIS (ARGON PLASMA COAGULATION/BICAP);  Surgeon: Jeryl Columbia, MD;  Location: [facility] END[facility];  Service: Endoscopy;  Laterality: N/A;  . INSERT / REPLACE / REMOVE PACEMAKER  2001   Last generator in 2006; interrogated Dec 2012  . LEAD REVISION N/A 01/02/2014   Procedure: LEAD REVISION;  Surgeon: Sanda Klein, MD;  Location: [place] CATH LAB;  Service: Cardiovascular;  Laterality: N/A;  . LIPOMA EXCISION Left 07/2013   wrist  . MASS EXCISION Left 09/14/2013   Procedure: EXCISION MASS LEFT WRIST;  Surgeon: Leanora Cover, MD;  Location: [place];  Service: Orthopedics;  Laterality: Left;  . PACEMAKER GENERATOR CHANGE N/A 01/02/2014   Procedure: PACEMAKER GENERATOR CHANGE;  Surgeon: Sanda Klein, MD;  Location: [place] CATH LAB;  Service: Cardiovascular;  Laterality: N/A;  . SUBXYPHOID PERICARDIAL WINDOW N/A 01/16/2015   Procedure: SUBXYPHOID PERICARDIAL WINDOW;  Surgeon: Grace Isaac, MD;  Location: [place];  Service: Thoracic;  Laterality: N/A;   . TEE WITHOUT CARDIOVERSION N/A 01/16/2015   Procedure: TRANSESOPHAGEAL ECHOCARDIOGRAM (TEE);  Surgeon: Grace Isaac, MD;  Location: [place];  Service: Thoracic;  Laterality: N/A;  . TONSILLECTOMY AND ADENOIDECTOMY  1950   Social History   Social History  . Marital status: Widowed    Spouse name: N/A  . Number of children: N/A  . Years of education: N/A   Social History Main Topics  . Smoking status: Never Smoker  . Smokeless tobacco: Never Used  . Alcohol use 0.0 oz/week     Comment: 01/02/2014 "I'll have a drink a few times/year"  . Drug use: No  . Sexual activity: No   Other Topics Concern  . None   Social History Narrative  . None   Family History  Problem Relation Age of Onset  . Stroke Mother   . Stroke Father   . Pneumonia Father   . Colon cancer Sister   . Colon cancer Daughter    Allergies  Allergen Reactions  . Anti-Inflammatory Enzyme [Nutritional Supplements] Other (See Comments)    Retains fluids and headaches  . Iodinated Diagnostic Agents Hives    HIVES 15MIN S/P IV CONTRAST INJECTION,WILL NEED 13 HR PREP FOR FUTURE INJECTIONS, ok s/p '50mg'$  po benadryl//a.[place]oun  . Spironolactone Other (See Comments)    Hair loss  . Arthrotec [Diclofenac-Misoprostol] Other (See Comments)    unknown  . Biaxin [Clarithromycin] Other (See Comments)    unknown  . Plavix [Clopidogrel Bisulfate] Other (See Comments)    unknown   Prior to Admission medications   Medication Sig Start Date End Date Taking? Authorizing Provider  acetaminophen (TYLENOL) 500 MG tablet Take 1 tablet (500 mg total) by mouth every 8 (eight) hours as needed for moderate pain. 09/30/15  Yes Mauricio Gerome Apley, MD  ALPRAZolam Duanne Moron) 0.25 MG tablet Take 1 tablet (0.25 mg total) by mouth 3 (three) times daily as needed for anxiety. 09/30/15  Yes Mauricio Gerome Apley, MD  Amino Acids-Protein Hydrolys (FEEDING SUPPLEMENT, PRO-STAT SUGAR FREE 64,) LIQD Take 30 mLs by mouth 2 (two) times daily.  09/30/15  Yes Mauricio Gerome Apley, MD  Cholecalciferol (VITAMIN D3) 5000 units CAPS Take 5,000 Units by mouth daily.   Yes Historical Provider, MD  cyclobenzaprine (FLEXERIL) 5 MG tablet Take 1 tablet (5 mg total) by mouth 3 (three) times daily as needed for muscle spasms. 09/30/15  Yes Mauricio Gerome Apley, MD  DIGITEK 125 MCG tablet Take 0.5 tablets by mouth as directed. Take 0.5 tablet 5 days a week 06/26/15  Yes Historical Provider, MD  lidocaine (LIDODERM) 5 % Place 1 patch onto the skin daily. Remove & Discard patch within 12 hours or as directed by MD 09/30/15  Yes Mauricio Gerome Apley, MD  magnesium gluconate (MAGONATE) 500 MG tablet Take 1 tablet (500 mg total) by mouth daily. 06/26/15  Yes Daniel J Angiulli, PA-C  metolazone (ZAROXOLYN) 2.5 MG tablet Take 1 tablet (2.5 mg total) by mouth 2 (two) times a week. 09/02/15 10/26/16 Yes Mihai Croitoru, MD  Multiple Vitamin (MULTIVITAMIN WITH MINERALS) TABS  tablet Take 1 tablet by mouth daily.   Yes Historical Provider, MD  oxyCODONE (OXY IR/ROXICODONE) 5 MG immediate release tablet Take 1 tablet (5 mg total) by mouth every 6 (six) hours as needed for breakthrough pain. 09/30/15  Yes Mauricio Gerome Apley, MD  potassium chloride (K-DUR) 10 MEQ tablet Take 2 tablets (20 mEq total) by mouth daily. With torsemide 08/12/15  Yes Mihai Croitoru, MD  saccharomyces boulardii (FLORASTOR) 250 MG capsule Take 1 capsule (250 mg total) by mouth 2 (two) times daily. 06/26/15  Yes Daniel J Angiulli, PA-C  torsemide (DEMADEX) 20 MG tablet Take 1 tablet (20 mg total) by mouth daily. 09/30/15  Yes Mauricio Gerome Apley, MD  verapamil (CALAN-SR) 120 MG CR tablet Take 1 tablet (120 mg total) by mouth daily. 06/26/15  Yes Daniel J Angiulli, PA-C  warfarin (COUMADIN) 5 MG tablet Take 1 tablet (5 mg total) by mouth daily at 6 PM. Patient taking differently: Take 5 mg by mouth daily at 6 PM. Take 2.'5mg'$ s on Monday, Wednesday, and Friday, take '5mg'$ s on all other days 06/26/15  Yes  Daniel J Angiulli, PA-C  amoxicillin-clavulanate (AUGMENTIN) 875-125 MG tablet Take 1 tablet by mouth 2 (two) times daily. Patient not taking: Reported on 10/08/2015 09/30/15   Mauricio Gerome Apley, MD   Dg Chest 2 View  Result Date: 10/08/2015 CLINICAL DATA:  Fall tonight, on anticoagulation. EXAM: CHEST  2 VIEW COMPARISON:  Chest radiograph 09/28/2015, included lung bases from CT abdomen/pelvis 09/24/2015. FINDINGS: Multi lead right-sided pacemaker. Larger into the cardiac silhouette. Abnormal 1 density abutting the right heart border likely secondary to large pericardial effusion as seen on recent CT. Small bilateral pleural effusions. There is mild vascular congestion. No pneumothorax or new parenchymal opacity. No acute osseous abnormalities are seen, thoracic spine obscured on the lateral view. IMPRESSION: 1. Development of vascular congestion. Stable enlarged cardiac silhouette, pericardial effusion noted on recent prior abdominal CTs. 2. Small bilateral pleural effusions. Electronically Signed   By: Jeb Levering M.D.   On: 10/08/2015 02:32   Dg Forearm Left  Result Date: 10/08/2015 CLINICAL DATA:  [place] tonight EXAM: LEFT FOREARM - 2 VIEW COMPARISON:  None. FINDINGS: There is no evidence of fracture or other focal bone lesions. Soft tissues are unremarkable. IMPRESSION: Negative. Electronically Signed   By: Andreas Newport M.D.   On: 10/08/2015 02:28   Dg Wrist Complete Right  Result Date: 10/08/2015 CLINICAL DATA:  [place] tonight EXAM: RIGHT WRIST - COMPLETE 3+ VIEW COMPARISON:  None. FINDINGS: There is no evidence of fracture or dislocation. There is no evidence of arthropathy or other focal bone abnormality. Soft tissues are unremarkable. IMPRESSION: Negative. Electronically Signed   By: Andreas Newport M.D.   On: 10/08/2015 02:29   Ct Head Wo Contrast  Result Date: 10/08/2015 CLINICAL DATA:  [place] today. EXAM: CT HEAD WITHOUT CONTRAST TECHNIQUE: Contiguous axial images were obtained  from the base of the skull through the vertex without intravenous contrast. COMPARISON:  05/18/2011 FINDINGS: Brain: There is no intracranial hemorrhage, mass or evidence of acute infarction. Remote infarction in the posterior right frontal convexity. There is mild generalized atrophy. There is mild chronic microvascular ischemic change. There is no significant extra-axial fluid collection. No acute intracranial findings are evident. Vascular: No hyperdense vessel or unexpected calcification. Skull: Normal. Negative for fracture or focal lesion. Sinuses/Orbits: No acute finding. IMPRESSION: No acute intracranial findings. There is mild generalized atrophy and chronic appearing white matter hypodensities which likely represent small vessel ischemic disease. Small remote  infarction in the posterior right frontal convexity. Electronically Signed   By: Andreas Newport M.D.   On: 10/08/2015 02:35   Dg Hip Unilat W Or Wo Pelvis 2-3 Views Left  Result Date: 10/08/2015 CLINICAL DATA:  [place] fall tonight. EXAM: DG HIP (WITH OR WITHOUT PELVIS) 2-3V LEFT COMPARISON:  None. FINDINGS: There is an acute intertrochanteric left hip fracture with mild varus angulation. No dislocation. No radiographic findings to suggest a pathologic basis for the fracture. IMPRESSION: Intertrochanteric left hip fracture. Electronically Signed   By: Andreas Newport M.D.   On: 10/08/2015 02:28    All pertinent xrays, MRI, CT independently reviewed and interpreted  Positive ROS: All other systems have been reviewed and were otherwise negative with the exception of those mentioned in the HPI and as above.  Physical Exam: General: Alert, no acute distress Cardiovascular: No pedal edema Respiratory: No cyanosis, no use of accessory musculature GI: No organomegaly, abdomen is soft and non-tender Skin: No lesions in the area of chief complaint Neurologic: Sensation intact distally Psychiatric: Patient is competent for consent  with normal mood and affect Lymphatic: No axillary or cervical lymphadenopathy  MUSCULOSKELETAL:  - pain with movement of the hip and extremity - skin intact - NVI distally - compartments soft  Assessment: Left intertroch hip fracture  Plan: - surgery is recommended, patient and family are aware of r/b/a and wish to proceed - consent obtained - medical optimization per primary team - hypokalemia per primary team - 2 doses of vit K ordered, 1 unit of FFP ordered - hold coumadin - surgery is planned for wednesday - Based on history and fracture pattern this likely represents a fragility fracture. - Fragility fractures affect up to one half of women and one third of men after age 77 years and occur in the setting of bone disorder such as osteoporosis or osteopenia and warrant appropriate work-up. - The following are general recommendations that may serve as an outline for an appropriate work-up:  1.) Obtain bone density measurement to confirm presumptive diagnosis, assess severity of osteoporosis and risk of future fracture, and use as baseline for monitoring treatment  2.) Obtain laboratory tests: CBC, ESR, serum calcium, creatinine, albumin,phosphate, alkaline phosphatase, liver transaminases, protein electrophoresis, urinalysis, 25-hydroxyvitamin D.  3.) Exclude secondary causes of low bone mass and skeletal fragility (eg,multiple myeloma, lymphoma) as indicated.  4.) Obtain radiograph of thoracic and lumbar spine, particularly among individuals with back pain or height loss to assess presence of vertebral fractures  5.) Intermittent administration of recombinant human parathyroid hormone  6.) Optimize nutritional status using nutritional supplementation.  7.) Patient/family education to prevent future falls.  8.) Early mobilization and exercise program - exercise decreases the rate of bone loss and has been associated with decreased rate of fragility fractures   Thank you  for the consult and the opportunity to see Ms. Ireta Pullman. Eduard Roux, MD [place] [phone] 8:18 AM

## 2015-10-08 NOTE — H&P (Signed)
History and Physical    EDEE [name] [id number] DOB: 1923-10-04 DOA: 10/08/2015  Referring MD/NP/PA: Dr. Randal Buba PCP: [name] Reichmann, MD  Patient coming from: Home  Chief Complaint: Fall   HPI: [name] is a 82 y.o. female with medical history significant of anemia, A. fib on chronic anticoagulation of Coumadin, chronic diastolic CHF (last echo in 01/2015 with EF of 55-60%), chronic pericardial effusion, recurrent diverticulitis s/p colostomy; who presents after having a fall with complaints of left hip pain. She reports that she was weighing herself and accidentally stepped off the scale losing her balance and falling backwards onto her left hip. She reports having Associated symptoms include right wrist and left forearm pain. She is unsure of any head trauma or loss of consciousness. Patient just was just hospitalized here at [facility] from 8/29 - 9/4 after being found to have a small bowel obstruction that resolved with IV fluids, analgesics, and antiemetics.  ED Course: On admission to the emergency department patient patient was evaluated with imaging studies which revealed an intratrochanteric left hip fracture. Lab work revealed potassium 2.2, ionized calcium 1.05, and mag 1.7. Patient was given 80 mEq of potassium chloride oral, 1 g of magnesium sulfate IV, and fentanyl for pain while in the ED. Orthopedics Dr. Erlinda Hong consulted who recommended transfer to [facility]. [facility] called to admit.  Review of Systems: As per HPI otherwise 10 point review of systems negative.   Past Medical History:  Diagnosis Date  . Anemia   . Arthritis   . Chronic atrial fibrillation (HCC)    a. on Coumadin, Digoxin, and Verapamil  . Chronic diastolic (congestive) heart failure ([place])    a. Echo 01/2015 with EF of 55-60%.  . History of GI bleed    a. secondary to AVM's. Treated with Fe infusion  . History of shingles   . HTN (hypertension)   . LVH (left ventricular hypertrophy)   . Obesity     . Pericardial effusion    a. s/p pericardial window 01/16/15  . Positive TB test   . Sleep apnea    mild-no cpap  . Stroke [facility])    a. 2013: right frontal  . Tachycardia-bradycardia syndrome [facility])    a. s/p Med[employer]onic [place], model number [id number], serial number [id number] H 12/2013    Past Surgical History:  Procedure Laterality Date  . BOWEL RESECTION N/A 05/31/2015   Procedure: SMALL BOWEL RESECTION;  Surgeon: Donnie Mesa, MD;  Location: [place];  Service: General;  Laterality: N/A;  . BREAST LUMPECTOMY Bilateral    negative for cancer  . CATARACT EXTRACTION W/ INTRAOCULAR LENS  IMPLANT, BILATERAL Bilateral   . CHOLECYSTECTOMY  2005  . COLON RESECTION N/A 05/31/2015   Procedure: SIGMOID COLECTOMY;  Surgeon: Donnie Mesa, MD;  Location: [place];  Service: General;  Laterality: N/A;  . COLONOSCOPY  08/11/2011   Procedure: COLONOSCOPY;  Surgeon: Jeryl Columbia, MD;  Location: [facility] ENDOSCOPY;  Service: Endoscopy;  Laterality: N/A;  . COLOSTOMY N/A 05/31/2015   Procedure: DESCENDING COLOSTOMY;  Surgeon: Donnie Mesa, MD;  Location: [place];  Service: General;  Laterality: N/A;  . DEBRIDEMENT OF ABDOMINAL WALL ABSCESS N/A 05/31/2015   Procedure: DRAINAGE OF PELVIC ABSCESS;  Surgeon: Donnie Mesa, MD;  Location: [place];  Service: General;  Laterality: N/A;  . ESOPHAGOGASTRODUODENOSCOPY  05/19/2011   Procedure: ESOPHAGOGASTRODUODENOSCOPY (EGD);  Surgeon: Lear Ng, MD;  Location: [facility] ENDOSCOPY;  Service: Endoscopy;  Laterality: N/A;  doctor aware of inr  will try to be here no later than 230  . ESOPHAGOGASTRODUODENOSCOPY  06/22/2011   Procedure: ESOPHAGOGASTRODUODENOSCOPY (EGD);  Surgeon: Arta Silence, MD;  Location: [facility] ENDOSCOPY;  Service: Endoscopy;  Laterality: N/A;  Check PT/INR in am  . GIVENS CAPSULE STUDY  06/23/2011   Procedure: GIVENS CAPSULE STUDY;  Surgeon: Arta Silence, MD;  Location: [facility] ENDOSCOPY;  Service: Endoscopy;  Laterality: N/A;  . HOT HEMOSTASIS  08/11/2011   Procedure: HOT  HEMOSTASIS (ARGON PLASMA COAGULATION/BICAP);  Surgeon: Jeryl Columbia, MD;  Location: [facility] END[facility];  Service: Endoscopy;  Laterality: N/A;  . INSERT / REPLACE / REMOVE PACEMAKER  2001   Last generator in 2006; interrogated Dec 2012  . LEAD REVISION N/A 01/02/2014   Procedure: LEAD REVISION;  Surgeon: Sanda Klein, MD;  Location: [place] CATH LAB;  Service: Cardiovascular;  Laterality: N/A;  . LIPOMA EXCISION Left 07/2013   wrist  . MASS EXCISION Left 09/14/2013   Procedure: EXCISION MASS LEFT WRIST;  Surgeon: Leanora Cover, MD;  Location: [place];  Service: Orthopedics;  Laterality: Left;  . PACEMAKER GENERATOR CHANGE N/A 01/02/2014   Procedure: PACEMAKER GENERATOR CHANGE;  Surgeon: Sanda Klein, MD;  Location: [place] CATH LAB;  Service: Cardiovascular;  Laterality: N/A;  . SUBXYPHOID PERICARDIAL WINDOW N/A 01/16/2015   Procedure: SUBXYPHOID PERICARDIAL WINDOW;  Surgeon: Grace Isaac, MD;  Location: [place];  Service: Thoracic;  Laterality: N/A;  . TEE WITHOUT CARDIOVERSION N/A 01/16/2015   Procedure: TRANSESOPHAGEAL ECHOCARDIOGRAM (TEE);  Surgeon: Grace Isaac, MD;  Location: [place];  Service: Thoracic;  Laterality: N/A;  . [place]     reports that she has never smoked. She has never used smokeless tobacco. She reports that she drinks alcohol. She reports that she does not use drugs.  Allergies  Allergen Reactions  . Anti-Inflammatory Enzyme [Nutritional Supplements] Other (See Comments)    Retains fluids and headaches  . Iodinated Diagnostic Agents Hives    HIVES 15MIN S/P IV CONTRAST INJECTION,WILL NEED 13 HR PREP FOR FUTURE INJECTIONS, ok s/p 50mg  po benadryl//a.calhoun  . Spironolactone Other (See Comments)    Hair loss  . Arthrotec [Diclofenac-Misoprostol] Other (See Comments)    unknown  . Biaxin [Clarithromycin] Other (See Comments)    unknown  . Plavix [Clopidogrel Bisulfate] Other (See Comments)    unknown    Family History    Problem Relation Age of Onset  . Stroke Mother   . Stroke Father   . Pneumonia Father   . Colon cancer Sister   . Colon cancer Daughter     Prior to Admission medications   Medication Sig Start Date End Date Taking? Authorizing Provider  acetaminophen (TYLENOL) 500 MG tablet Take 1 tablet (500 mg total) by mouth every 8 (eight) hours as needed for moderate pain. 09/30/15  Yes Mauricio Gerome Apley, MD  ALPRAZolam Duanne Moron) 0.25 MG tablet Take 1 tablet (0.25 mg total) by mouth 3 (three) times daily as needed for anxiety. 09/30/15  Yes Mauricio Gerome Apley, MD  Amino Acids-Protein Hydrolys (FEEDING SUPPLEMENT, PRO-STAT SUGAR FREE 64,) LIQD Take 30 mLs by mouth 2 (two) times daily. 09/30/15  Yes Mauricio Gerome Apley, MD  Cholecalciferol (VITAMIN D3) 5000 units CAPS Take 5,000 Units by mouth daily.   Yes Historical Provider, MD  cyclobenzaprine (FLEXERIL) 5 MG tablet Take 1 tablet (5 mg total) by mouth 3 (three) times daily as needed for muscle spasms. 09/30/15  Yes Mauricio Gerome Apley, MD  DIGITEK 125 MCG tablet Take 0.5  tablets by mouth as directed. Take 0.5 tablet 5 days a week 06/26/15  Yes Historical Provider, MD  lidocaine (LIDODERM) 5 % Place 1 patch onto the skin daily. Remove & Discard patch within 12 hours or as directed by MD 09/30/15  Yes Mauricio Gerome Apley, MD  magnesium gluconate (MAGONATE) 500 MG tablet Take 1 tablet (500 mg total) by mouth daily. 06/26/15  Yes Daniel J Angiulli, PA-C  metolazone (ZAROXOLYN) 2.5 MG tablet Take 1 tablet (2.5 mg total) by mouth 2 (two) times a week. 09/02/15 10/26/16 Yes Mihai Croitoru, MD  Multiple Vitamin (MULTIVITAMIN WITH MINERALS) TABS tablet Take 1 tablet by mouth daily.   Yes Historical Provider, MD  oxyCODONE (OXY IR/ROXICODONE) 5 MG immediate release tablet Take 1 tablet (5 mg total) by mouth every 6 (six) hours as needed for breakthrough pain. 09/30/15  Yes Mauricio Gerome Apley, MD  potassium chloride (K-DUR) 10 MEQ tablet Take 2 tablets (20 mEq  total) by mouth daily. With torsemide 08/12/15  Yes Mihai Croitoru, MD  saccharomyces boulardii (FLORASTOR) 250 MG capsule Take 1 capsule (250 mg total) by mouth 2 (two) times daily. 06/26/15  Yes Daniel J Angiulli, PA-C  torsemide (DEMADEX) 20 MG tablet Take 1 tablet (20 mg total) by mouth daily. 09/30/15  Yes Mauricio Gerome Apley, MD  verapamil (CALAN-SR) 120 MG CR tablet Take 1 tablet (120 mg total) by mouth daily. 06/26/15  Yes Daniel J Angiulli, PA-C  warfarin (COUMADIN) 5 MG tablet Take 1 tablet (5 mg total) by mouth daily at 6 PM. Patient taking differently: Take 5 mg by mouth daily at 6 PM. Take 2.5mg s on Monday, Wednesday, and Friday, take 5mg s on all other days 06/26/15  Yes Daniel J Angiulli, PA-C  amoxicillin-clavulanate (AUGMENTIN) 875-125 MG tablet Take 1 tablet by mouth 2 (two) times daily. Patient not taking: Reported on 10/08/2015 09/30/15   Tawni Millers, MD    Physical Exam: Constitutional: Elderly female in moderate distress  Vitals:   10/08/15 0121 10/08/15 0227 10/08/15 0300 10/08/15 0400  BP: 122/59 125/55 (!) 123/50 (!) 123/49  Pulse: 65 60 (!) 39 (!) 33  Resp: 19 20 20 19   Temp:      TempSrc:      SpO2: 94% 94% 93% 91%   Eyes: PERRL, lids and conjunctivae normal ENMT: Mucous membranes are moist. Posterior pharynx clear of any exudate or lesions.Normal dentition.  Neck: normal, supple, no masses, no thyromegaly Respiratory: clear to auscultation bilaterally, no wheezing, no crackles. Normal respiratory effort. No accessory muscle use.  Cardiovascular: Regular rate and rhythm, no murmurs / rubs / gallops. No extremity edema. 2+ pedal pulses. No carotid bruits.  Abdomen: no tenderness, no masses palpated. No hepatosplenomegaly. Bowel sounds positive.  Musculoskeletal: no clubbing / cyanosis. No joint deformity upper and lower extremities. Good ROM, no contractures. Normal muscle tone.  Skin: no rashes, lesions, ulcers. No induration Neurologic: CN 2-12 grossly  intact. Sensation intact, DTR normal. Strength 5/5 in all 4.  Psychiatric: Normal judgment and insight. Alert and oriented x 3. Normal mood.     Labs on Admission: I have personally reviewed following labs and imaging studies  CBC:  Recent Labs Lab 10/08/15 0230 10/08/15 0241  WBC 5.7  --   NEUTROABS 4.0  --   HGB 9.4* 10.5*  HCT 30.6* 31.0*  MCV 70.3*  --   PLT 253  --    Basic Metabolic Panel:  Recent Labs Lab 10/08/15 0241 10/08/15 0334  NA 139  --  K 2.3* 2.2*  CL 91*  --   GLUCOSE 123*  --   BUN 21*  --   CREATININE 1.50*  --   MG  --  1.7   GFR: Estimated Creatinine Clearance: 25 mL/min (by C-G formula based on SCr of 1.5 mg/dL). Liver Function Tests: No results for input(s): AST, ALT, ALKPHOS, BILITOT, PROT, ALBUMIN in the last 168 hours. No results for input(s): LIPASE, AMYLASE in the last 168 hours. No results for input(s): AMMONIA in the last 168 hours. Coagulation Profile:  Recent Labs Lab 10/08/15 0230  INR 2.90   Cardiac Enzymes: No results for input(s): CKTOTAL, CKMB, CKMBINDEX, TROPONINI in the last 168 hours. BNP (last 3 results) No results for input(s): PROBNP in the last 8760 hours. HbA1C: No results for input(s): HGBA1C in the last 72 hours. CBG: No results for input(s): GLUCAP in the last 168 hours. Lipid Profile: No results for input(s): CHOL, HDL, LDLCALC, TRIG, CHOLHDL, LDLDIRECT in the last 72 hours. Thyroid Function Tests: No results for input(s): TSH, T4TOTAL, FREET4, T3FREE, THYROIDAB in the last 72 hours. Anemia Panel: No results for input(s): VITAMINB12, FOLATE, FERRITIN, TIBC, IRON, RETICCTPCT in the last 72 hours. Urine analysis:    Component Value Date/Time   COLORURINE YELLOW 09/24/2015 2303   APPEARANCEUR CLEAR 09/24/2015 2303   LABSPEC 1.013 09/24/2015 2303   PHURINE 7.0 09/24/2015 2303   GLUCOSEU NEGATIVE 09/24/2015 2303   HGBUR NEGATIVE 09/24/2015 2303   BILIRUBINUR NEGATIVE 09/24/2015 2303   BILIRUBINUR  negative 02/21/2015 1203   BILIRUBINUR Negative 07/17/2014 1207   KETONESUR NEGATIVE 09/24/2015 2303   PROTEINUR NEGATIVE 09/24/2015 2303   UROBILINOGEN 0.2 02/21/2015 1203   UROBILINOGEN 0.2 07/02/2011 0443   NITRITE NEGATIVE 09/24/2015 2303   LEUKOCYTESUR NEGATIVE 09/24/2015 2303   Sepsis Labs: No results found for this or any previous visit (from the past 240 hour(s)).   Radiological Exams on Admission: Dg Chest 2 View  Result Date: 10/08/2015 CLINICAL DATA:  Fall tonight, on anticoagulation. EXAM: CHEST  2 VIEW COMPARISON:  Chest radiograph 09/28/2015, included lung bases from CT abdomen/pelvis 09/24/2015. FINDINGS: Multi lead right-sided pacemaker. Larger into the cardiac silhouette. Abnormal 1 density abutting the right heart border likely secondary to large pericardial effusion as seen on recent CT. Small bilateral pleural effusions. There is mild vascular congestion. No pneumothorax or new parenchymal opacity. No acute osseous abnormalities are seen, thoracic spine obscured on the lateral view. IMPRESSION: 1. Development of vascular congestion. Stable enlarged cardiac silhouette, pericardial effusion noted on recent prior abdominal CTs. 2. Small bilateral pleural effusions. Electronically Signed   By: Jeb Levering M.D.   On: 10/08/2015 02:32   Dg Forearm Left  Result Date: 10/08/2015 CLINICAL DATA:  [place] tonight EXAM: LEFT FOREARM - 2 VIEW COMPARISON:  None. FINDINGS: There is no evidence of fracture or other focal bone lesions. Soft tissues are unremarkable. IMPRESSION: Negative. Electronically Signed   By: Andreas Newport M.D.   On: 10/08/2015 02:28   Dg Wrist Complete Right  Result Date: 10/08/2015 CLINICAL DATA:  [place] tonight EXAM: RIGHT WRIST - COMPLETE 3+ VIEW COMPARISON:  None. FINDINGS: There is no evidence of fracture or dislocation. There is no evidence of arthropathy or other focal bone abnormality. Soft tissues are unremarkable. IMPRESSION: Negative. Electronically  Signed   By: Andreas Newport M.D.   On: 10/08/2015 02:29   Ct Head Wo Contrast  Result Date: 10/08/2015 CLINICAL DATA:  [place] today. EXAM: CT HEAD WITHOUT CONTRAST TECHNIQUE: Contiguous axial images were obtained  from the base of the skull through the vertex without intravenous contrast. COMPARISON:  05/18/2011 FINDINGS: Brain: There is no intracranial hemorrhage, mass or evidence of acute infarction. Remote infarction in the posterior right frontal convexity. There is mild generalized atrophy. There is mild chronic microvascular ischemic change. There is no significant extra-axial fluid collection. No acute intracranial findings are evident. Vascular: No hyperdense vessel or unexpected calcification. Skull: Normal. Negative for fracture or focal lesion. Sinuses/Orbits: No acute finding. IMPRESSION: No acute intracranial findings. There is mild generalized atrophy and chronic appearing white matter hypodensities which likely represent small vessel ischemic disease. Small remote infarction in the posterior right frontal convexity. Electronically Signed   By: Andreas Newport M.D.   On: 10/08/2015 02:35   Dg Hip Unilat W Or Wo Pelvis 2-3 Views Left  Result Date: 10/08/2015 CLINICAL DATA:  [place] fall tonight. EXAM: DG HIP (WITH OR WITHOUT PELVIS) 2-3V LEFT COMPARISON:  None. FINDINGS: There is an acute intertrochanteric left hip fracture with mild varus angulation. No dislocation. No radiographic findings to suggest a pathologic basis for the fracture. IMPRESSION: Intertrochanteric left hip fracture. Electronically Signed   By: Andreas Newport M.D.   On: 10/08/2015 02:28    EKG: Independently reviewed. Paced rhythm  Assessment/Plan Hip fracture (HCC) - Admit to telemetry bed - Hip fracture protocol initiated - Follow-up with orthopedics for further recommendations    Paroxysmal atrial fibrillation on chronic anticoagulation therapy s/p pacemaker: CHADS2Vasc 7. INR therapeutic at 2.9. -  Continue digoxin and verapamil - Hold warfarin  Acute kidney injury on chronic kidney disease stage III: Prior to discharge last on 9/4, creatinine had trended down to 1.2 . On presentation creatinine elevated at 1.5 with a BUN of 21. - Gentle IV fluids with electrolyte replacement - Continue to monitor  Hypokalemia: Initial potassium 2.2 on admission. Given 80 mEq of potassium chloride in ED -  continue potassium chloride 50 mEq IV -  continue to monitor placed as needed   Essential hypertension - Continue home verapamil  Chronic diastolic congestive heart failure: Stable at this time. - Strict intake and output - Continue metolazone - Restart torosemide when appropriate  Microcytic microchromic anemia: Hemoglobin 9.4 admission  - Continue to monitor   DVT prophylaxis:  Patient on warfarin with therapeutic INR Code Status: Full Family Communication: discussed plan with patient and daughter who was present at bedside Disposition Plan: TBD Consults called: Orthopedic surgery Admission status: Inpatient telemetry bed  Norval Morton MD [facility] Pager [phone]  If 7PM-7AM, please contact night-coverage [email or web address] Password [facility]  10/08/2015, 4:39 AM

## 2015-10-08 NOTE — ED Notes (Signed)
Report called  

## 2015-10-08 NOTE — Consult Note (Signed)
Reason for Consult: Cardiovascular preoperative risk assessment  Requesting Physician: Hongalgi  Cardiologist: Arnulfo Batson  HPI: This is a 82 y.o. female with a past medical history significant for long-standing permanent atrial fibrillation with previous embolic stroke (during interruption of anticoagulation), hypertensive heart disease with LVH, chronic diastolic heart failure, large pericardial effusion status post pericardial window (December 2016), history of GI bleeding secondary to arteriovenous malformations, permanent pacemaker for slow ventricular response, chronic bilateral leg edema, colostomy following partial colectomy for severe diverticulosis, is admitted with a closed left hip fracture following a mechanical fall (without loss of consciousness or head injury).   She has severe hypokalemia, at least in part related to treatment with diuretics. She was recently hospitalized with a small bowel obstruction that resolved with conservative management. She has been planning take down of her colostomy due to the presence of a parastomal hernia.  She is currently receiving fresh frozen plasma and has received vitamin K, with plans for surgery tomorrow. She is also receiving aggressive potassium replacement.  She has not had any recent problems with exertional dyspnea and denies angina. She has not had syncope, dizziness or palpitations. She does have muscle cramps. She lives independently, with help from her daughter. After her most recent hospital discharge she has moved in with her sister.  Her pacemaker was last checked on August 9 and has normal device function. She is not pacemaker dependent and lead parameters are normal. The Medtronic sensia generator was implanted in 2015 and still has >7 years estimated longevity. She has roughly 50% ventricular sensed and 50% ventricular paced rhythm and does not have episodes of high ventricular rate.  An echocardiogram performed on  September 1 showed some recurrence of her pericardial effusion which is loculated posteriorly and does not appear to be hemodynamically significant. She has normal left ventricular systolic function. She has severe tricuspid insufficiency and at least moderate pulmonary artery hypertension. She has mild aortic stenosis that is not likely hemodynamically important.  PMHx:  Past Medical History:  Diagnosis Date  . Anemia   . Arthritis   . Chronic atrial fibrillation (HCC)    a. on Coumadin, Digoxin, and Verapamil  . Chronic diastolic (congestive) heart failure ([place])    a. Echo 01/2015 with EF of 55-60%.  . Hip fracture, left ([place]) 09/2015   CLOSED  . History of GI bleed    a. secondary to AVM's. Treated with Fe infusion  . History of shingles   . HTN (hypertension)   . LVH (left ventricular hypertrophy)   . Obesity   . Pericardial effusion    a. s/p pericardial window 01/16/15  . Positive TB test   . Sleep apnea    mild-no cpap  . Stroke [facility])    a. 2013: right frontal  . Tachycardia-bradycardia syndrome [facility])    a. s/p Medtronic [place], model number [id number], serial number [id number] H 12/2013   Past Surgical History:  Procedure Laterality Date  . BOWEL RESECTION N/A 05/31/2015   Procedure: SMALL BOWEL RESECTION;  Surgeon: Donnie Mesa, MD;  Location: [place];  Service: General;  Laterality: N/A;  . BREAST LUMPECTOMY Bilateral    negative for cancer  . CATARACT EXTRACTION W/ INTRAOCULAR LENS  IMPLANT, BILATERAL Bilateral   . CHOLECYSTECTOMY  2005  . COLON RESECTION N/A 05/31/2015   Procedure: SIGMOID COLECTOMY;  Surgeon: Donnie Mesa, MD;  Location: [place];  Service: General;  Laterality: N/A;  . COLONOSCOPY  08/11/2011   Procedure: COLONOSCOPY;  Surgeon: Altamese Dilling  Drema Balzarine, MD;  Location: [facility] ENDOSCOPY;  Service: Endoscopy;  Laterality: N/A;  . COLOSTOMY N/A 05/31/2015   Procedure: DESCENDING COLOSTOMY;  Surgeon: Donnie Mesa, MD;  Location: [place];  Service: General;  Laterality: N/A;  .  DEBRIDEMENT OF ABDOMINAL WALL ABSCESS N/A 05/31/2015   Procedure: DRAINAGE OF PELVIC ABSCESS;  Surgeon: Donnie Mesa, MD;  Location: [place];  Service: General;  Laterality: N/A;  . ESOPHAGOGASTRODUODENOSCOPY  05/19/2011   Procedure: ESOPHAGOGASTRODUODENOSCOPY (EGD);  Surgeon: Lear Ng, MD;  Location: [facility] ENDOSCOPY;  Service: Endoscopy;  Laterality: N/A;  doctor aware of inr   will try to be here no later than 230  . ESOPHAGOGASTRODUODENOSCOPY  06/22/2011   Procedure: ESOPHAGOGASTRODUODENOSCOPY (EGD);  Surgeon: Arta Silence, MD;  Location: [facility] ENDOSCOPY;  Service: Endoscopy;  Laterality: N/A;  Check PT/INR in am  . GIVENS CAPSULE STUDY  06/23/2011   Procedure: GIVENS CAPSULE STUDY;  Surgeon: Arta Silence, MD;  Location: [facility] ENDOSCOPY;  Service: Endoscopy;  Laterality: N/A;  . HOT HEMOSTASIS  08/11/2011   Procedure: HOT HEMOSTASIS (ARGON PLASMA COAGULATION/BICAP);  Surgeon: Jeryl Columbia, MD;  Location: [facility];  Service: Endoscopy;  Laterality: N/A;  . INSERT / REPLACE / REMOVE PACEMAKER  2001   Last generator in 2006; interrogated Dec 2012  . LEAD REVISION N/A 01/02/2014   Procedure: LEAD REVISION;  Surgeon: Sanda Klein, MD;  Location: [place] CATH LAB;  Service: Cardiovascular;  Laterality: N/A;  . LIPOMA EXCISION Left 07/2013   wrist  . MASS EXCISION Left 09/14/2013   Procedure: EXCISION MASS LEFT WRIST;  Surgeon: Leanora Cover, MD;  Location: [place];  Service: Orthopedics;  Laterality: Left;  . PACEMAKER GENERATOR CHANGE N/A 01/02/2014   Procedure: PACEMAKER GENERATOR CHANGE;  Surgeon: Sanda Klein, MD;  Location: [place] CATH LAB;  Service: Cardiovascular;  Laterality: N/A;  . SUBXYPHOID PERICARDIAL WINDOW N/A 01/16/2015   Procedure: SUBXYPHOID PERICARDIAL WINDOW;  Surgeon: Grace Isaac, MD;  Location: [place];  Service: Thoracic;  Laterality: N/A;  . TEE WITHOUT CARDIOVERSION N/A 01/16/2015   Procedure: TRANSESOPHAGEAL ECHOCARDIOGRAM (TEE);  Surgeon: Grace Isaac, MD;  Location: [facility] OR;  Service: Thoracic;  Laterality: N/A;  . TONSILLECTOMY AND ADENOIDECTOMY  1950    FAMHx: Family History  Problem Relation Age of Onset  . Stroke Mother   . Stroke Father   . Pneumonia Father   . Colon cancer Sister   . Colon cancer Daughter     SOCHx:  reports that she has never smoked. She has never used smokeless tobacco. She reports that she drinks alcohol. She reports that she does not use drugs.  ALLERGIES: Allergies  Allergen Reactions  . Anti-Inflammatory Enzyme [Nutritional Supplements] Other (See Comments)    Retains fluids and headaches  . Iodinated Diagnostic Agents Hives    HIVES 15MIN S/P IV CONTRAST INJECTION,WILL NEED 13 HR PREP FOR FUTURE INJECTIONS, ok s/p 50mg  po benadryl//a.calhoun  . Spironolactone Other (See Comments)    Hair loss  . Arthrotec [Diclofenac-Misoprostol] Other (See Comments)    unknown  . Biaxin [Clarithromycin] Other (See Comments)    unknown  . Plavix [Clopidogrel Bisulfate] Other (See Comments)    unknown    ROS: Pertinent items noted in HPI and remainder of comprehensive ROS otherwise negative.  HOME MEDICATIONS: Prescriptions Prior to Admission  Medication Sig Dispense Refill Last Dose  . acetaminophen (TYLENOL) 500 MG tablet Take 1 tablet (500 mg total) by mouth every 8 (eight) hours as needed for moderate pain. 30 tablet  0 10/07/2015 at Unknown time  . ALPRAZolam (XANAX) 0.25 MG tablet Take 1 tablet (0.25 mg total) by mouth 3 (three) times daily as needed for anxiety. 10 tablet 0 10/07/2015 at Unknown time  . Amino Acids-Protein Hydrolys (FEEDING SUPPLEMENT, PRO-STAT SUGAR FREE 64,) LIQD Take 30 mLs by mouth 2 (two) times daily. 900 mL 0 10/07/2015 at Unknown time  . Cholecalciferol (VITAMIN D3) 5000 units CAPS Take 5,000 Units by mouth daily.   10/07/2015 at Unknown time  . cyclobenzaprine (FLEXERIL) 5 MG tablet Take 1 tablet (5 mg total) by mouth 3 (three) times daily as needed for muscle spasms. 15  tablet 0 unknown  . DIGITEK 125 MCG tablet Take 0.5 tablets by mouth as directed. Take 0.5 tablet 5 days a week  1 Past Week at Unknown time  . lidocaine (LIDODERM) 5 % Place 1 patch onto the skin daily. Remove & Discard patch within 12 hours or as directed by MD 10 patch 0 10/07/2015 at Unknown time  . magnesium gluconate (MAGONATE) 500 MG tablet Take 1 tablet (500 mg total) by mouth daily. 30 tablet 1 10/07/2015 at Unknown time  . metolazone (ZAROXOLYN) 2.5 MG tablet Take 1 tablet (2.5 mg total) by mouth 2 (two) times a week. 15 tablet 11 Past Week at Unknown time  . Multiple Vitamin (MULTIVITAMIN WITH MINERALS) TABS tablet Take 1 tablet by mouth daily.   10/07/2015 at Unknown time  . oxyCODONE (OXY IR/ROXICODONE) 5 MG immediate release tablet Take 1 tablet (5 mg total) by mouth every 6 (six) hours as needed for breakthrough pain. 10 tablet 0 10/07/2015 at 2200  . potassium chloride (K-DUR) 10 MEQ tablet Take 2 tablets (20 mEq total) by mouth daily. With torsemide 180 tablet 2 10/07/2015 at Unknown time  . saccharomyces boulardii (FLORASTOR) 250 MG capsule Take 1 capsule (250 mg total) by mouth 2 (two) times daily. 60 capsule 1 10/07/2015 at Unknown time  . torsemide (DEMADEX) 20 MG tablet Take 1 tablet (20 mg total) by mouth daily. 30 tablet 0 10/07/2015 at Unknown time  . verapamil (CALAN-SR) 120 MG CR tablet Take 1 tablet (120 mg total) by mouth daily. 30 tablet 1 10/07/2015 at Unknown time  . warfarin (COUMADIN) 5 MG tablet Take 1 tablet (5 mg total) by mouth daily at 6 PM. (Patient taking differently: Take 5 mg by mouth daily at 6 PM. Take 2.5mg s on Monday, Wednesday, and Friday, take 5mg s on all other days) 30 tablet 1 10/07/2015 at 1730  . amoxicillin-clavulanate (AUGMENTIN) 875-125 MG tablet Take 1 tablet by mouth 2 (two) times daily. (Patient not taking: Reported on 10/08/2015) 10 tablet 0 Completed Course at 10/06/15    HOSPITAL MEDICATIONS: I have reviewed the patient's current  medications. Scheduled: . [START ON 10/09/2015]  ceFAZolin (ANCEF) IV  2 g Intravenous On Call to OR  . digoxin  0.0625 mg Oral Once per day on Mon Tue Wed Thu Fri  . [START ON 10/09/2015] Influenza vac split quadrivalent PF  0.5 mL Intramuscular Tomorrow-1000  . lidocaine  1 patch Transdermal Q24H  . magnesium gluconate  500 mg Oral Daily  . [START ON 10/10/2015] metolazone  2.5 mg Oral Once per day on Mon Thu  . phytonadione (VITAMIN K) IV  10 mg Intravenous BID  . potassium chloride  20 mEq Oral Daily  . povidone-iodine  2 application Topical Once  . saccharomyces boulardii  250 mg Oral BID  . verapamil  120 mg Oral Daily   Continuous:  VITALS: Blood pressure (!) 114/50, pulse 61, temperature 98.9 F (37.2 C), temperature source Oral, resp. rate 18, height 5\' 4"  (1.626 m), weight 155 lb 1.6 oz (70.4 kg), SpO2 94 %.  PHYSICAL EXAM:  General: Alert, oriented x3, no distress Head: no evidence of trauma, PERRL, EOMI, no exophtalmos or lid lag, no myxedema, no xanthelasma; normal ears, nose and oropharynx Neck: Minimal elevation in jugular venous pulsations and no hepatojugular reflux, but with very prominent V waves; brisk carotid pulses without delay and no carotid bruits Chest: clear to auscultation, no signs of consolidation by percussion or palpation, normal fremitus, symmetrical and full respiratory excursions Cardiovascular: normal position and quality of the apical impulse, regular rhythm, normal first heart sound and paradoxically split second heart sound, no rubs or gallops, 3/6 holosystolic murmur at the left lower sternal border Abdomen: no tenderness or distention, no masses by palpation, no abnormal pulsatility or arterial bruits, normal bowel sounds, no hepatosplenomegaly Extremities: no clubbing, cyanosis;  symmetrical 1-2 plus pretibial edema; 2+ radial, ulnar and brachial pulses bilaterally; 2+ right femoral, posterior tibial and dorsalis pedis pulses; 2+ left femoral,  posterior tibial and dorsalis pedis pulses; no subclavian or femoral bruits Neurological: grossly nonfocal   LABS  CBC  Recent Labs  10/08/15 0230 10/08/15 0241  WBC 5.7  --   NEUTROABS 4.0  --   HGB 9.4* 10.5*  HCT 30.6* 31.0*  MCV 70.3*  --   PLT 253  --    Basic Metabolic Panel  Recent Labs  10/08/15 0241 10/08/15 0334  NA 139  --   K 2.3* 2.2*  CL 91*  --   GLUCOSE 123*  --   BUN 21*  --   CREATININE 1.50*  --   MG  --  1.7      IMAGING: Dg Chest 2 View  Result Date: 10/08/2015 CLINICAL DATA:  Fall tonight, on anticoagulation. EXAM: CHEST  2 VIEW COMPARISON:  Chest radiograph 09/28/2015, included lung bases from CT abdomen/pelvis 09/24/2015. FINDINGS: Multi lead right-sided pacemaker. Larger into the cardiac silhouette. Abnormal 1 density abutting the right heart border likely secondary to large pericardial effusion as seen on recent CT. Small bilateral pleural effusions. There is mild vascular congestion. No pneumothorax or new parenchymal opacity. No acute osseous abnormalities are seen, thoracic spine obscured on the lateral view. IMPRESSION: 1. Development of vascular congestion. Stable enlarged cardiac silhouette, pericardial effusion noted on recent prior abdominal CTs. 2. Small bilateral pleural effusions. Electronically Signed   By: Jeb Levering M.D.   On: 10/08/2015 02:32   Dg Forearm Left  Result Date: 10/08/2015 CLINICAL DATA:  [place] tonight EXAM: LEFT FOREARM - 2 VIEW COMPARISON:  None. FINDINGS: There is no evidence of fracture or other focal bone lesions. Soft tissues are unremarkable. IMPRESSION: Negative. Electronically Signed   By: Andreas Newport M.D.   On: 10/08/2015 02:28   Dg Wrist Complete Right  Result Date: 10/08/2015 CLINICAL DATA:  [place] tonight EXAM: RIGHT WRIST - COMPLETE 3+ VIEW COMPARISON:  None. FINDINGS: There is no evidence of fracture or dislocation. There is no evidence of arthropathy or other focal bone abnormality. Soft  tissues are unremarkable. IMPRESSION: Negative. Electronically Signed   By: Andreas Newport M.D.   On: 10/08/2015 02:29   Ct Head Wo Contrast  Result Date: 10/08/2015 CLINICAL DATA:  [place] today. EXAM: CT HEAD WITHOUT CONTRAST TECHNIQUE: Contiguous axial images were obtained from the base of the skull through the vertex without intravenous contrast. COMPARISON:  05/18/2011 FINDINGS:  Brain: There is no intracranial hemorrhage, mass or evidence of acute infarction. Remote infarction in the posterior right frontal convexity. There is mild generalized atrophy. There is mild chronic microvascular ischemic change. There is no significant extra-axial fluid collection. No acute intracranial findings are evident. Vascular: No hyperdense vessel or unexpected calcification. Skull: Normal. Negative for fracture or focal lesion. Sinuses/Orbits: No acute finding. IMPRESSION: No acute intracranial findings. There is mild generalized atrophy and chronic appearing white matter hypodensities which likely represent small vessel ischemic disease. Small remote infarction in the posterior right frontal convexity. Electronically Signed   By: Andreas Newport M.D.   On: 10/08/2015 02:35   Dg Hip Unilat W Or Wo Pelvis 2-3 Views Left  Result Date: 10/08/2015 CLINICAL DATA:  [place] fall tonight. EXAM: DG HIP (WITH OR WITHOUT PELVIS) 2-3V LEFT COMPARISON:  None. FINDINGS: There is an acute intertrochanteric left hip fracture with mild varus angulation. No dislocation. No radiographic findings to suggest a pathologic basis for the fracture. IMPRESSION: Intertrochanteric left hip fracture. Electronically Signed   By: Andreas Newport M.D.   On: 10/08/2015 02:28    ECG: Atrial fibrillation with 100% ventricular paced rhythm; the QT appears severely prolonged but is hard to measure with accuracy due to baseline artifact  TELEMETRY:  ventricular paced rhythm  IMPRESSION: 1. Preop risk evaluation: She is at moderate-high  risk of perioperative cardiovascular complications due to her numerous comorbid conditions, advanced age and poor overall functional status. She is at particular risk for development of congestive heart failure and embolic stroke with interruption and reversal of anticoagulation. Having said that, the risks of not repairing her hip fracture greatly outweigh the risks of surgery itself. She understands this.  2. AFib, permanent: Following surgery it will likely be impossible to quickly resume warfarin anticoagulation since she has received large doses of vitamin K. I think she'll be best suited for postoperative DVT and stroke prophylaxis with a direct oral anticoagulant such as Eliquis or Xarelto. This would also allow more versatility for management of anticoagulation around the time of future possible surgery for colostomy takedown, which she was planning. 3. Diastolic heart failure: She does not appear to be overtly hypervolemic. Usually her leg edema is disproportionate compared with overall degree of hypervolemia, possibly due to a component of lymphedema. Notes that her chest x-ray did suggest some early volume overload though. Cannot exclude underlying senile cardiac amyloidosis. 4. Pericardial effusion: This is located exclusively posterior to the heart and is relatively small in overall volume. He does not appear to be of any hemodynamic consequence at this time. 5. AS: Mild and unlikely to be clinically relevant in the near future. 6. TR: She has severe tricuspid regurgitation and at least moderate pulmonary artery hypertension. Most likely this is related to chronic diastolic heart failure, but may make her sensitive to aggressive volume shifts (for example hypotension with rapid  Diuresis). 7. Severe hypokalemia: This has to be corrected before she can safely undergo surgery. 8. Pacemaker: Normal device function and recent check. Hip surgery is unlikely to interfere with pacemaker function. She is  not pacemaker dependent.    RECOMMENDATION: 1. Proceed with surgery for hip fracture after potassium level is normalized 2. Start anticoagulation for prophylaxis of venous embolic and arterial embolic events as soon as deemed safe by her surgeon; prefer direct oral anticoagulant rather than warfarin 3. As much as possible, avoid rapid volume shifts in either direction.  Prognosis is guarded  Time Spent Directly with Patient: 35  minutes  Sanda Klein, MD, [facility] [phone] office [phone] pager   10/08/2015, 5:50 PM

## 2015-10-08 NOTE — Progress Notes (Addendum)
PROGRESS NOTE  [name]  [id number] DOB: 1923/12/29  DOA: 10/08/2015 PCP: [name], MD  Primary Cardiologist: Dr. Sanda Klein  Brief Narrative:  82 year old female with a PMH of chronic A. fib on Coumadin, chronic diastolic CHF, 2-D echo [id number] with LVEF 55-60 percent, HTN, pericardial effusion status post pericardial window 01/16/15, stroke, tachybradycardia syndrome status post pacemaker, anemia, status post recent hospitalization 09/24/15-09/30/15 for urgent sigmoid colectomy with Hartmann's procedure and small bowel resection on 05/31/15 following which transferred to inpatient rehabilitation and then transitioned home, awaiting colostomy reversal/repair of parastomal hernia, presented to ED on 10/08/15 following a mechanical fall at home and sustained left hip pain, right wrist and left forearm pain. Workup revealed left intertrochanteric hip fracture, potassium 2.2. Orthopedics/Dr. Erlinda Hong was consulted and plan for surgery on 10/09/15 after medical optimization.   Assessment & Plan:   Principal Problem:   Closed left hip fracture [facility]) Active Problems:   Pacemaker   Paroxysmal atrial fibrillation (HCC)   Hypokalemia   Anemia   Fall   Left intertrochanteric hip fracture - Sustained following mechanical fall - Orthopedic consultation appreciated. Plan for surgery on 10/09/15. - Holding Coumadin and reversing INR with vitamin K and FFP ordered by orthopedics. -As per Dr. Erlinda Hong, this appears to be a fragility fracture and needs further evaluation as per his recommendations which can be carried out as outpatient. - Based on available data, patient is at moderate risk for perioperative CV events but may proceed with indicated surgery without any further cardiac evaluation with usual close perioperative monitoring and even in and out fluid balance. - Pre op clearance for elective Surgery was being considered. I have consulted Cardiology for pre op clearance for the hip surgery as  well.  Chronic atrial fibrillation/tachybradycardia syndrome status post pacemaker - CHADS2Vasc 7. INR therapeutic at 2.9 on admission. - Continue digoxin and verapamil - Warfarin held and INR being reversed for surgery.   Chronic diastolic CHF - Clinically appears euvolemic  Acute on stage III chronic kidney disease - Creatinine at recent discharge 09/30/15:1.2. Now presented with creatinine of 1.5.  - Briefly hydrated with IV fluids. Chest x-ray suggests pulmonary congestion. DC IV fluids.  Hypokalemia - Potassium 2.2 on admission. Mg: 1.7. Replacing aggressively. Follow BMP and magnesium. - Discussed with RN at this time and apparently IV runs of potassium is still ongoing and labs will be drawn once completed.  Essential hypertension - Controlled.  Anemia - Stable. Follow CBCs postoperatively.   DVT prophylaxis: SCD's Code Status: Full Family Communication: None at bedside. Discussed with patient. Disposition Plan: Likely SNF when medically ready.   Consultants:   Orthopedics  Procedures:   Foley  Antimicrobials:   None    Subjective: Significant left hip pain, worsened by minimal activity. Difficulty voiding due to pain and hesitant to use bedpan or move. Denies dyspnea, chest pain, palpitations, dizziness or lightheadedness prior to follow-up since then. No bleeding reported.   Objective:  Vitals:   10/08/15 0905 10/08/15 1325 10/08/15 1504 10/08/15 1530  BP: (!) 128/49 (!) 106/59 (!) 116/51 (!) 114/50  Pulse: (!) 120 61 65 61  Resp: 20  18 18   Temp: 98.3 F (36.8 C) 98.6 F (37 C) 98.9 F (37.2 C) 98.9 F (37.2 C)  TempSrc: Oral Oral Oral Oral  SpO2: 92% 94% 94%   Weight:      Height:        Intake/Output Summary (Last 24 hours) at 10/08/15 1620 Last data filed at 10/08/15 1600  Gross per 24 hour  Intake              976 ml  Output              850 ml  Net              126 ml   Filed Weights   10/08/15 0600  Weight: 70.4 kg (155 lb 1.6  oz)    Examination:  General exam: Pleasant elderly frail female lying comfortably supine in bed.  Respiratory system: Clear to auscultation. Respiratory effort normal. Cardiovascular system: S1 & S2 heard, Irregularly irregular. No JVD, murmurs, rubs, gallops or clicks. No pedal edema. V paced.  Gastrointestinal system: Abdomen is nondistended, soft and nontender. No organomegaly or masses felt. Normal bowel sounds heard. Central nervous system: Alert and oriented. No focal neurological deficits. Extremities: Symmetric 5 x 5 power except left lower extremity where movements are restricted due to pain.. Skin: No rashes, lesions or ulcers Psychiatry: Judgement and insight appear normal. Mood & affect appropriate.     Data Reviewed: I have personally reviewed following labs and imaging studies  CBC:  Recent Labs Lab 10/08/15 0230 10/08/15 0241  WBC 5.7  --   NEUTROABS 4.0  --   HGB 9.4* 10.5*  HCT 30.6* 31.0*  MCV 70.3*  --   PLT 253  --    Basic Metabolic Panel:  Recent Labs Lab 10/08/15 0241 10/08/15 0334  NA 139  --   K 2.3* 2.2*  CL 91*  --   GLUCOSE 123*  --   BUN 21*  --   CREATININE 1.50*  --   MG  --  1.7   GFR: Estimated Creatinine Clearance: 24 mL/min (by C-G formula based on SCr of 1.5 mg/dL). Liver Function Tests: No results for input(s): AST, ALT, ALKPHOS, BILITOT, PROT, ALBUMIN in the last 168 hours. No results for input(s): LIPASE, AMYLASE in the last 168 hours. No results for input(s): AMMONIA in the last 168 hours. Coagulation Profile:  Recent Labs Lab 10/08/15 0230  INR 2.90   Cardiac Enzymes: No results for input(s): CKTOTAL, CKMB, CKMBINDEX, TROPONINI in the last 168 hours. BNP (last 3 results) No results for input(s): PROBNP in the last 8760 hours. HbA1C: No results for input(s): HGBA1C in the last 72 hours. CBG: No results for input(s): GLUCAP in the last 168 hours. Lipid Profile: No results for input(s): CHOL, HDL, LDLCALC, TRIG,  CHOLHDL, LDLDIRECT in the last 72 hours. Thyroid Function Tests: No results for input(s): TSH, T4TOTAL, FREET4, T3FREE, THYROIDAB in the last 72 hours. Anemia Panel: No results for input(s): VITAMINB12, FOLATE, FERRITIN, TIBC, IRON, RETICCTPCT in the last 72 hours.  Sepsis Labs: No results for input(s): PROCALCITON, LATICACIDVEN in the last 168 hours.  No results found for this or any previous visit (from the past 240 hour(s)).       Radiology Studies: Dg Chest 2 View  Result Date: 10/08/2015 CLINICAL DATA:  Fall tonight, on anticoagulation. EXAM: CHEST  2 VIEW COMPARISON:  Chest radiograph 09/28/2015, included lung bases from CT abdomen/pelvis 09/24/2015. FINDINGS: Multi lead right-sided pacemaker. Larger into the cardiac silhouette. Abnormal 1 density abutting the right heart border likely secondary to large pericardial effusion as seen on recent CT. Small bilateral pleural effusions. There is mild vascular congestion. No pneumothorax or new parenchymal opacity. No acute osseous abnormalities are seen, thoracic spine obscured on the lateral view. IMPRESSION: 1. Development of vascular congestion. Stable enlarged cardiac silhouette, pericardial effusion noted on  recent prior abdominal CTs. 2. Small bilateral pleural effusions. Electronically Signed   By: Jeb Levering M.D.   On: 10/08/2015 02:32   Dg Forearm Left  Result Date: 10/08/2015 CLINICAL DATA:  [place] tonight EXAM: LEFT FOREARM - 2 VIEW COMPARISON:  None. FINDINGS: There is no evidence of fracture or other focal bone lesions. Soft tissues are unremarkable. IMPRESSION: Negative. Electronically Signed   By: Andreas Newport M.D.   On: 10/08/2015 02:28   Dg Wrist Complete Right  Result Date: 10/08/2015 CLINICAL DATA:  [place] tonight EXAM: RIGHT WRIST - COMPLETE 3+ VIEW COMPARISON:  None. FINDINGS: There is no evidence of fracture or dislocation. There is no evidence of arthropathy or other focal bone abnormality. Soft tissues are  unremarkable. IMPRESSION: Negative. Electronically Signed   By: Andreas Newport M.D.   On: 10/08/2015 02:29   Ct Head Wo Contrast  Result Date: 10/08/2015 CLINICAL DATA:  [place] today. EXAM: CT HEAD WITHOUT CONTRAST TECHNIQUE: Contiguous axial images were obtained from the base of the skull through the vertex without intravenous contrast. COMPARISON:  05/18/2011 FINDINGS: Brain: There is no intracranial hemorrhage, mass or evidence of acute infarction. Remote infarction in the posterior right frontal convexity. There is mild generalized atrophy. There is mild chronic microvascular ischemic change. There is no significant extra-axial fluid collection. No acute intracranial findings are evident. Vascular: No hyperdense vessel or unexpected calcification. Skull: Normal. Negative for fracture or focal lesion. Sinuses/Orbits: No acute finding. IMPRESSION: No acute intracranial findings. There is mild generalized atrophy and chronic appearing white matter hypodensities which likely represent small vessel ischemic disease. Small remote infarction in the posterior right frontal convexity. Electronically Signed   By: Andreas Newport M.D.   On: 10/08/2015 02:35   Dg Hip Unilat W Or Wo Pelvis 2-3 Views Left  Result Date: 10/08/2015 CLINICAL DATA:  [place] fall tonight. EXAM: DG HIP (WITH OR WITHOUT PELVIS) 2-3V LEFT COMPARISON:  None. FINDINGS: There is an acute intertrochanteric left hip fracture with mild varus angulation. No dislocation. No radiographic findings to suggest a pathologic basis for the fracture. IMPRESSION: Intertrochanteric left hip fracture. Electronically Signed   By: Andreas Newport M.D.   On: 10/08/2015 02:28        Scheduled Meds: . [START ON 10/09/2015]  ceFAZolin (ANCEF) IV  2 g Intravenous On Call to OR  . digoxin  0.0625 mg Oral Once per day on Mon Tue Wed Thu Fri  . lidocaine  1 patch Transdermal Q24H  . magnesium gluconate  500 mg Oral Daily  . [START ON 10/10/2015]  metolazone  2.5 mg Oral Once per day on Mon Thu  . phytonadione (VITAMIN K) IV  10 mg Intravenous BID  . potassium chloride  20 mEq Oral Daily  . potassium chloride  10 mEq Intravenous Q2H  . povidone-iodine  2 application Topical Once  . saccharomyces boulardii  250 mg Oral BID  . verapamil  120 mg Oral Daily   Continuous Infusions: . sodium chloride 75 mL/hr at 10/08/15 0900     LOS: 0 days    Time spent: 45 minutes.    [facility] [place], MD [facility] Pager [phone]  If 7PM-7AM, please contact night-coverage [email or web address] Password [email or web address] 10/08/2015, 4:20 PM

## 2015-10-08 NOTE — ED Notes (Signed)
Bed: [id number] Expected date:  Expected time:  Means of arrival:  Comments: 82 yo F  Fall, left hip pain

## 2015-10-08 NOTE — Progress Notes (Signed)
CRITICAL VALUE ALERT  Critical value received:  Potassium 2.6  Date of notification:  10/08/15  Time of notification:  21:00  Critical value read back:Yes.    Nurse who received alert: Rafael Bihari  MD notified (1st page): Schorr, MD  Time of first page:  21:04  MD notified (2nd page):  Time of second page:  Responding MD: Hilbert Bible, MD  Time MD responded:  [id number]

## 2015-10-08 NOTE — ED Notes (Signed)
Patient transported to X-ray 

## 2015-10-08 NOTE — ED Provider Notes (Signed)
[place] DEPT Provider Note   CSN: [id number] Arrival date & time: 10/08/15  0107  By signing my name below, I, Dyke Brackett, attest that this documentation has been prepared under the direction and in the presence of Apolinar Bero, MD . Electronically Signed: Dyke Brackett, Scribe. 10/08/2015. 1:37 AM.   History   Chief Complaint Chief Complaint  Patient presents with  . Fall  . Hip Pain   HPI [name] is a 82 y.o. female with hx of CHF, HTN,  who presents to the Emergency Department complaining of sudden onset left hip pain s/p fall tonight PTA. Pt stepped off a scale and fell backwards. She is unsure of any head injury. Pt also notes associated right wrist, left forearm, and left hand pain. No alleviating or modifying factors noted.  The history is provided by the patient. No language interpreter was used.  Fall  This is a new problem. The current episode started less than 1 hour ago. The problem occurs constantly. The problem has not changed since onset.Pertinent negatives include no chest pain. Nothing aggravates the symptoms. Nothing relieves the symptoms. She has tried nothing for the symptoms. The treatment provided no relief.   Past Medical History:  Diagnosis Date  . Anemia   . Arthritis   . Chronic atrial fibrillation (HCC)    a. on Coumadin, Digoxin, and Verapamil  . Chronic diastolic (congestive) heart failure ([place])    a. Echo 01/2015 with EF of 55-60%.  . History of GI bleed    a. secondary to AVM's. Treated with Fe infusion  . History of shingles   . HTN (hypertension)   . LVH (left ventricular hypertrophy)   . Obesity   . Pericardial effusion    a. s/p pericardial window 01/16/15  . Positive TB test   . Sleep apnea    mild-no cpap  . Stroke [facility])    a. 2013: right frontal  . Tachycardia-bradycardia syndrome [facility])    a. s/p Medtronic [place], model number [id number], serial number [id number] H 12/2013    Patient Active Problem List   Diagnosis  Date Noted  . Colostomy in place [facility]) 09/25/2015  . SBO (small bowel obstruction) ([place]) 09/24/2015  . Gastrointestinal hemorrhage with melena   . Subtherapeutic anticoagulation   . Severe malnutrition ([place]) 06/20/2015  . Acute deep vein thrombosis (DVT) of brachial vein of right upper extremity ([place])   . Localized edema   . Hypokalemia   . Hypomagnesemia   . Debilitated 06/14/2015  . History of creation of ostomy 06/14/2015  . Abdominal pain   . Chronic diastolic congestive heart failure ([place])   . History of CVA with residual deficit   . Respiratory depression   . Stool culture positive for Clostridium difficile   . On total parenteral nutrition (TPN)   . Urinary retention   . Leukocytosis   . Tachycardia   . Normocytic anemia   . AKI (acute kidney injury) ([place])   . Status post partial colectomy   . Ileus, postoperative   . Postoperative pain   . Diverticulitis of large intestine with perforation and abscess without bleeding   . CKD (chronic kidney disease) stage 3, GFR 30-59 ml/min 05/26/2015  . HOH (hard of hearing) 05/26/2015  . Protein-calorie malnutrition, moderate ([place]) 05/26/2015  . Diverticulitis of intestine with perforation and abscess 05/25/2015  . Diverticulosis   . Chronic thoracic back pain   . Acute on chronic diastolic heart failure ([place])   . Perforation of intestine due  to diverticulitis of gastrointestinal tract ([place])   . SOB (shortness of breath) 05/01/2015  . Supratherapeutic INR   . Paroxysmal atrial fibrillation (HCC)   . Essential hypertension, benign   . Left sided abdominal pain   . Diverticulitis 03/08/2015  . Obesity   . Chronic atrial fibrillation ([place])   . Upper airway cough syndrome   . Dyspnea 05/22/2014  . Tachycardia-bradycardia syndrome with symptomatic bradycardia 01/03/2014  . History of CVA (cerebrovascular accident) 10/25/2013  . Diabetes ([place]) 08/03/2013  . Myofacial muscle pain 10/16/2011  . Back pain, Left Flank 07/01/2011  .  Cerebral embolism with cerebral infarction ([place]) 06/12/2011  . CVA (cerebral infarction) 05/20/2011  . Weakness of left side of body 05/16/2011  . Elevated digoxin level 05/16/2011  . Pacemaker 05/21/2010  . Edema 04/24/2010  . Pericardial effusion, Large   . Chronic anticoagulation - Coumadin, CHADS2VASC=7   . HTN (hypertension)     Past Surgical History:  Procedure Laterality Date  . BOWEL RESECTION N/A 05/31/2015   Procedure: SMALL BOWEL RESECTION;  Surgeon: Donnie Mesa, MD;  Location: [place];  Service: General;  Laterality: N/A;  . BREAST LUMPECTOMY Bilateral    negative for cancer  . CATARACT EXTRACTION W/ INTRAOCULAR LENS  IMPLANT, BILATERAL Bilateral   . CHOLECYSTECTOMY  2005  . COLON RESECTION N/A 05/31/2015   Procedure: SIGMOID COLECTOMY;  Surgeon: Donnie Mesa, MD;  Location: [place];  Service: General;  Laterality: N/A;  . COLONOSCOPY  08/11/2011   Procedure: COLONOSCOPY;  Surgeon: Jeryl Columbia, MD;  Location: [facility] ENDOSCOPY;  Service: Endoscopy;  Laterality: N/A;  . COLOSTOMY N/A 05/31/2015   Procedure: DESCENDING COLOSTOMY;  Surgeon: Donnie Mesa, MD;  Location: [place];  Service: General;  Laterality: N/A;  . DEBRIDEMENT OF ABDOMINAL WALL ABSCESS N/A 05/31/2015   Procedure: DRAINAGE OF PELVIC ABSCESS;  Surgeon: Donnie Mesa, MD;  Location: [place];  Service: General;  Laterality: N/A;  . ESOPHAGOGASTRODUODENOSCOPY  05/19/2011   Procedure: ESOPHAGOGASTRODUODENOSCOPY (EGD);  Surgeon: Lear Ng, MD;  Location: [facility] ENDOSCOPY;  Service: Endoscopy;  Laterality: N/A;  doctor aware of inr   will try to be here no later than 230  . ESOPHAGOGASTRODUODENOSCOPY  06/22/2011   Procedure: ESOPHAGOGASTRODUODENOSCOPY (EGD);  Surgeon: Arta Silence, MD;  Location: [facility] ENDOSCOPY;  Service: Endoscopy;  Laterality: N/A;  Check PT/INR in am  . GIVENS CAPSULE STUDY  06/23/2011   Procedure: GIVENS CAPSULE STUDY;  Surgeon: Arta Silence, MD;  Location: [facility] ENDOSCOPY;  Service: Endoscopy;  Laterality:  N/A;  . HOT HEMOSTASIS  08/11/2011   Procedure: HOT HEMOSTASIS (ARGON PLASMA COAGULATION/BICAP);  Surgeon: Jeryl Columbia, MD;  Location: [facility] ENDOS[facility];  Service: Endoscopy;  Laterality: N/A;  . INSERT / REPLACE / REMOVE PACEMAKER  2001   Last generator in 2006; interrogated Dec 2012  . LEAD REVISION N/A 01/02/2014   Procedure: LEAD REVISION;  Surgeon: Sanda Klein, MD;  Location: [place] CATH LAB;  Service: Cardiovascular;  Laterality: N/A;  . LIPOMA EXCISION Left 07/2013   wrist  . MASS EXCISION Left 09/14/2013   Procedure: EXCISION MASS LEFT WRIST;  Surgeon: Leanora Cover, MD;  Location: [place];  Service: Orthopedics;  Laterality: Left;  . PACEMAKER GENERATOR CHANGE N/A 01/02/2014   Procedure: PACEMAKER GENERATOR CHANGE;  Surgeon: Sanda Klein, MD;  Location: [place] CATH LAB;  Service: Cardiovascular;  Laterality: N/A;  . SUBXYPHOID PERICARDIAL WINDOW N/A 01/16/2015   Procedure: SUBXYPHOID PERICARDIAL WINDOW;  Surgeon: Grace Isaac, MD;  Location: [place];  Service: Thoracic;  Laterality: N/A;  . TEE WITHOUT CARDIOVERSION N/A 01/16/2015   Procedure: TRANSESOPHAGEAL ECHOCARDIOGRAM (TEE);  Surgeon: Grace Isaac, MD;  Location: [place];  Service: Thoracic;  Laterality: N/A;  . TONSILLECTOMY AND ADENOIDECTOMY  1950    OB History    No data available      Home Medications    Prior to Admission medications   Medication Sig Start Date End Date Taking? Authorizing Provider  acetaminophen (TYLENOL) 500 MG tablet Take 1 tablet (500 mg total) by mouth every 8 (eight) hours as needed for moderate pain. 09/30/15   Mauricio Gerome Apley, MD  ALPRAZolam Duanne Moron) 0.25 MG tablet Take 1 tablet (0.25 mg total) by mouth 3 (three) times daily as needed for anxiety. 09/30/15   Mauricio Gerome Apley, MD  Amino Acids-Protein Hydrolys (FEEDING SUPPLEMENT, PRO-STAT SUGAR FREE 64,) LIQD Take 30 mLs by mouth 2 (two) times daily. 09/30/15   Mauricio Gerome Apley, MD  amoxicillin-clavulanate (AUGMENTIN)  875-125 MG tablet Take 1 tablet by mouth 2 (two) times daily. 09/30/15   Mauricio Gerome Apley, MD  Cholecalciferol (VITAMIN D3) 5000 units CAPS Take 5,000 Units by mouth daily.    Historical Provider, MD  cyclobenzaprine (FLEXERIL) 5 MG tablet Take 1 tablet (5 mg total) by mouth 3 (three) times daily as needed for muscle spasms. 09/30/15   Mauricio Gerome Apley, MD  DIGITEK 125 MCG tablet Take 0.5 tablets by mouth as directed. Take 0.5 tablet 5 days a week 06/26/15   Historical Provider, MD  lidocaine (LIDODERM) 5 % Place 1 patch onto the skin daily. Remove & Discard patch within 12 hours or as directed by MD 09/30/15   Tawni Millers, MD  magnesium gluconate (MAGONATE) 500 MG tablet Take 1 tablet (500 mg total) by mouth daily. 06/26/15   Lavon Paganini Angiulli, PA-C  metolazone (ZAROXOLYN) 2.5 MG tablet Take 1 tablet (2.5 mg total) by mouth 2 (two) times a week. 09/02/15 10/26/16  Mihai Croitoru, MD  Multiple Vitamin (MULTIVITAMIN WITH MINERALS) TABS tablet Take 1 tablet by mouth daily.    Historical Provider, MD  oxyCODONE (OXY IR/ROXICODONE) 5 MG immediate release tablet Take 1 tablet (5 mg total) by mouth every 6 (six) hours as needed for breakthrough pain. 09/30/15   Mauricio Gerome Apley, MD  potassium chloride (K-DUR) 10 MEQ tablet Take 2 tablets (20 mEq total) by mouth daily. With torsemide 08/12/15   Mihai Croitoru, MD  saccharomyces boulardii (FLORASTOR) 250 MG capsule Take 1 capsule (250 mg total) by mouth 2 (two) times daily. 06/26/15   Lavon Paganini Angiulli, PA-C  torsemide (DEMADEX) 20 MG tablet Take 1 tablet (20 mg total) by mouth daily. 09/30/15   Mauricio Gerome Apley, MD  verapamil (CALAN-SR) 120 MG CR tablet Take 1 tablet (120 mg total) by mouth daily. 06/26/15   Lavon Paganini Angiulli, PA-C  warfarin (COUMADIN) 5 MG tablet Take 1 tablet (5 mg total) by mouth daily at 6 PM. Patient taking differently: Take 5 mg by mouth daily at 6 PM. Take 2.5mg s on Monday, Wednesday, and Friday, take 5mg s on all other  days 06/26/15   Cathlyn Parsons, PA-C    Family History Family History  Problem Relation Age of Onset  . Stroke Mother   . Stroke Father   . Pneumonia Father   . Colon cancer Sister   . Colon cancer Daughter     Social History Social History  Substance Use Topics  . Smoking status: Never Smoker  . Smokeless tobacco: Never Used  .  Alcohol use 0.0 oz/week     Comment: 01/02/2014 "I'll have a drink a few times/year"     Allergies   Anti-inflammatory enzyme [nutritional supplements]; Iodinated diagnostic agents; Spironolactone; Arthrotec [diclofenac-misoprostol]; Biaxin [clarithromycin]; and Plavix [clopidogrel bisulfate]   Review of Systems Review of Systems  Cardiovascular: Negative for chest pain.  Musculoskeletal: Positive for arthralgias.  Skin: Positive for color change.  Neurological: Negative for syncope.  Hematological: Bruises/bleeds easily.  All other systems reviewed and are negative.  Physical Exam Updated Vital Signs BP 125/57 (BP Location: Left Arm)   Pulse 64   Temp 97.6 F (36.4 C) (Oral)   Resp 19   SpO2 95%   Physical Exam  Constitutional: She is oriented to person, place, and time. She appears well-developed and well-nourished. No distress.  HENT:  Head: Normocephalic and atraumatic.  Mouth/Throat: Oropharynx is clear and moist. No oropharyngeal exudate.  Moist mucous membranes   Eyes: Conjunctivae are normal. Pupils are equal, round, and reactive to light.  Neck: No JVD present.  Trachea midline No bruit  Cardiovascular: Normal rate, regular rhythm and normal heart sounds.   Pulmonary/Chest: Effort normal and breath sounds normal. No stridor. No respiratory distress. She has no wheezes. She has no rales.  Abdominal: Soft. Bowel sounds are normal. She exhibits no distension and no mass. There is no tenderness. There is no rebound and no guarding.  Musculoskeletal: She exhibits tenderness and deformity.  Pedal edema  Deformity of left hip,  pelvis otherwise stable.   Neurological: She is alert and oriented to person, place, and time. She has normal reflexes.  Skin: Skin is warm and dry. Capillary refill takes less than 2 seconds.  Bruise on volar aspect of right wrist  Psychiatric: She has a normal mood and affect. Her behavior is normal.  Nursing note and vitals reviewed.  ED Treatments / Results  DIAGNOSTIC STUDIES: Vitals:   10/10/2015 0121 Oct 10, 2015 0227  BP: 122/59 125/55  Pulse: 65 60  Resp: 19 20  Temp:     Results for orders placed or performed during the hospital encounter of 10/10/15  CBC with Differential/Platelet  Result Value Ref Range   WBC 5.7 4.0 - 10.5 K/uL   RBC 4.35 3.87 - 5.11 MIL/uL   Hemoglobin 9.4 (L) 12.0 - 15.0 g/dL   HCT 30.6 (L) 36.0 - 46.0 %   MCV 70.3 (L) 78.0 - 100.0 fL   MCH 21.6 (L) 26.0 - 34.0 pg   MCHC 30.7 30.0 - 36.0 g/dL   RDW 16.4 (H) 11.5 - 15.5 %   Platelets 253 150 - 400 K/uL   Neutrophils Relative % PENDING %   Neutro Abs PENDING 1.7 - 7.7 K/uL   Band Neutrophils PENDING %   Lymphocytes Relative PENDING %   Lymphs Abs PENDING 0.7 - 4.0 K/uL   Monocytes Relative PENDING %   Monocytes Absolute PENDING 0.1 - 1.0 K/uL   Eosinophils Relative PENDING %   Eosinophils Absolute PENDING 0.0 - 0.7 K/uL   Basophils Relative PENDING %   Basophils Absolute PENDING 0.0 - 0.1 K/uL   WBC Morphology PENDING    RBC Morphology PENDING    Smear Review PENDING    nRBC PENDING 0 /100 WBC   Metamyelocytes Relative PENDING %   Myelocytes PENDING %   Promyelocytes Absolute PENDING %   Blasts PENDING %  Protime-INR  Result Value Ref Range   Prothrombin Time 31.0 (H) 11.4 - 15.2 seconds   INR 2.90   I-Stat Chem 8,  ED  Result Value Ref Range   Sodium 139 135 - 145 mmol/L   Potassium 2.3 (LL) 3.5 - 5.1 mmol/L   Chloride 91 (L) 101 - 111 mmol/L   BUN 21 (H) 6 - 20 mg/dL   Creatinine, Ser 1.50 (H) 0.44 - 1.00 mg/dL   Glucose, Bld 123 (H) 65 - 99 mg/dL   Calcium, Ion 1.05 (L) 1.15 - 1.40  mmol/L   TCO2 32 0 - 100 mmol/L   Hemoglobin 10.5 (L) 12.0 - 15.0 g/dL   HCT 31.0 (L) 36.0 - 46.0 %   Comment NOTIFIED PHYSICIAN    Dg Chest 2 View  Result Date: 10/08/2015 CLINICAL DATA:  Fall tonight, on anticoagulation. EXAM: CHEST  2 VIEW COMPARISON:  Chest radiograph 09/28/2015, included lung bases from CT abdomen/pelvis 09/24/2015. FINDINGS: Multi lead right-sided pacemaker. Larger into the cardiac silhouette. Abnormal 1 density abutting the right heart border likely secondary to large pericardial effusion as seen on recent CT. Small bilateral pleural effusions. There is mild vascular congestion. No pneumothorax or new parenchymal opacity. No acute osseous abnormalities are seen, thoracic spine obscured on the lateral view. IMPRESSION: 1. Development of vascular congestion. Stable enlarged cardiac silhouette, pericardial effusion noted on recent prior abdominal CTs. 2. Small bilateral pleural effusions. Electronically Signed   By: Jeb Levering M.D.   On: 10/08/2015 02:32   Dg Chest 2 View  Result Date: 09/28/2015 CLINICAL DATA:  Shortness of breath. EXAM: CHEST  2 VIEW COMPARISON:  06/06/2015 FINDINGS: The pacer wires are stable. Stable cardiac enlargement and tortuous and calcified thoracic aorta. Chronic underlying lung disease with superimposed pleural effusions and bibasilar infiltrates. The bony thorax is intact. IMPRESSION: Stable cardiac enlargement. Bilateral pleural effusions and bibasilar infiltrates. Electronically Signed   By: Marijo Sanes M.D.   On: 09/28/2015 08:27   Dg Forearm Left  Result Date: 10/08/2015 CLINICAL DATA:  [place] tonight EXAM: LEFT FOREARM - 2 VIEW COMPARISON:  None. FINDINGS: There is no evidence of fracture or other focal bone lesions. Soft tissues are unremarkable. IMPRESSION: Negative. Electronically Signed   By: Andreas Newport M.D.   On: 10/08/2015 02:28   Dg Wrist Complete Right  Result Date: 10/08/2015 CLINICAL DATA:  [place] tonight EXAM: RIGHT WRIST  - COMPLETE 3+ VIEW COMPARISON:  None. FINDINGS: There is no evidence of fracture or dislocation. There is no evidence of arthropathy or other focal bone abnormality. Soft tissues are unremarkable. IMPRESSION: Negative. Electronically Signed   By: Andreas Newport M.D.   On: 10/08/2015 02:29   Dg Abd 1 View  Result Date: 09/25/2015 CLINICAL DATA:  Bowel obstruction due to peristomal herniation of small bowel. EXAM: ABDOMEN - 1 VIEW COMPARISON:  CT scans dated 09/24/2015 and radiographs dated 06/11/2015 FINDINGS: The small bowel distention visible on the prior CT scan has resolved. The bowel gas pattern is normal. Colostomy in the left lower quadrant is noted. Aortic atherosclerosis.  Cholecystectomy. IMPRESSION: Resolution of small bowel obstruction. Aortic atherosclerosis. Electronically Signed   By: Lorriane Shire M.D.   On: 09/25/2015 09:39   Ct Head Wo Contrast  Result Date: 10/08/2015 CLINICAL DATA:  [place] today. EXAM: CT HEAD WITHOUT CONTRAST TECHNIQUE: Contiguous axial images were obtained from the base of the skull through the vertex without intravenous contrast. COMPARISON:  05/18/2011 FINDINGS: Brain: There is no intracranial hemorrhage, mass or evidence of acute infarction. Remote infarction in the posterior right frontal convexity. There is mild generalized atrophy. There is mild chronic microvascular ischemic change. There is no significant  extra-axial fluid collection. No acute intracranial findings are evident. Vascular: No hyperdense vessel or unexpected calcification. Skull: Normal. Negative for fracture or focal lesion. Sinuses/Orbits: No acute finding. IMPRESSION: No acute intracranial findings. There is mild generalized atrophy and chronic appearing white matter hypodensities which likely represent small vessel ischemic disease. Small remote infarction in the posterior right frontal convexity. Electronically Signed   By: Andreas Newport M.D.   On: 10/08/2015 02:35   Ct Cervical Spine  Wo Contrast  Result Date: 09/27/2015 CLINICAL DATA:  82 year old female with recent bowel surgery due to diverticulitis. Cervicothoracic pain radiating to the left shoulder. No known injury. Initial encounter. EXAM: CT CERVICAL SPINE WITHOUT CONTRAST TECHNIQUE: Multidetector CT imaging of the cervical spine was performed without intravenous contrast. Multiplanar CT image reconstructions were also generated. COMPARISON:  Neck radiographs 10/17/2011. FINDINGS: Study is intermittently, up to moderately degraded by motion artifact despite repeated imaging attempts. Osteopenia. Visualized skull base is intact. No atlanto-occipital dissociation. Straightening and mild reversal of cervical lordosis. Cervicothoracic junction alignment is within normal limits. Bilateral posterior element alignment is within normal limits. No cervical spine fracture identified. Negative visualized posterior fossa. Calcified atherosclerosis at the skull base. Visible tympanic cavities and mastoids are clear. Mild for age calcified carotid atherosclerosis in the neck. Partially retropharyngeal course of the carotids. Otherwise negative noncontrast neck soft tissues. Negative lung apices. Chronic cervical spine disc and posterior element degeneration. Mild degenerative appearing anterolisthesis at C3-C4 and C4-C5. Still, there is only mild if any cervical spinal stenosis suspected, primarily at C5-C6. IMPRESSION: 1. Osteopenia. Motion degraded exam despite repeated imaging attempts. No acute osseous abnormality identified in the cervical spine. 2. Cervical spine degeneration, but only mild if any suspected cervical spinal stenosis. Electronically Signed   By: Genevie Ann M.D.   On: 09/27/2015 15:01   Ct Thoracic Spine Wo Contrast  Result Date: 09/27/2015 CLINICAL DATA:  82 year old female with recent bowel surgery due to diverticulitis. Cervicothoracic pain radiating to the left shoulder. No known injury. Initial encounter. EXAM: CT THORACIC  SPINE WITHOUT CONTRAST TECHNIQUE: Multidetector CT imaging of the thoracic spine was performed without intravenous contrast administration. Multiplanar CT image reconstructions were also generated. COMPARISON:  Cervical spine CT from today reported separately. Thoracic spine radiographs 08/28/2015. Chest CT 07/04/2011. FINDINGS: Osteopenia. Normal thoracic segmentation. Stable mild exaggeration of thoracic kyphosis since 2013. Thoracic vertebrae appear intact. Posterior ribs appear intact. Visible upper lumbar levels appear intact. No CT evidence of thoracic spinal stenosis. Mild for age thoracic spine degeneration. Calcified aortic atherosclerosis. Cardiomegaly. Pericardial effusion not well visualized today. Small to moderate right and small left layering pleural effusions. Negative visualized noncontrast upper abdominal viscera. Patchy and confluent left lower lobe pulmonary opacity, appears peribronchial and inflammatory in nature. IMPRESSION: 1. Osteopenia. No acute osseous abnormality identified. In the thoracic spine. Mild for age thoracic spine degeneration. 2. Confluent left lower lobe pulmonary opacity could reflect aspiration or pneumonia in this setting. 3. Moderate layering right and small layering left pleural effusions. 4.  Calcified aortic atherosclerosis. 5. Cardiomegaly. Large pericardial effusion better demonstrated on CT Abdomen and Pelvis 09/24/2015 (please see that report). Electronically Signed   By: Genevie Ann M.D.   On: 09/27/2015 15:07   Ct Renal Stone Study  Result Date: 09/24/2015 CLINICAL DATA:  Acute on chronic back pain. Left flank pain. Leukocytosis. EXAM: CT ABDOMEN AND PELVIS WITHOUT CONTRAST TECHNIQUE: Multidetector CT imaging of the abdomen and pelvis was performed following the standard protocol without IV contrast. COMPARISON:  09/23/2015 FINDINGS: Lower chest:  Large and possibly loculated pericardial effusion, similar to prior. Extensive coronary atherosclerosis. Calcified  mitral valve. Pacer leads. Moderate cardiomegaly. Mild atelectasis in the right middle lobe and lingula. Mild atelectasis in both lower lobes. Hepatobiliary: Cholecystectomy Pancreas: Unremarkable Spleen: Unremarkable Adrenals/Urinary Tract: Abnormal appearance the kidneys with cortical contrast remaining on board, but some clearing of the medullary contrast. As far as I am aware the patient received contrast 33 hours prior to today's scan, the persistent contrast in the cortex is indicative of renal dysfunction. There is some excreted contrast in the collecting systems in urinary bladder is well. Scarring in the kidneys, right greater than left. Bilateral renal cysts. Stomach/Bowel: Small bowel obstruction due to a peristomal hernia of small bowel, about the junction of the jejunum and ileum, extending above the descending colostomy site. Small bowel loops up to 4.1 cm. Distended stomach. Appendix normal. Rectal stump unremarkable. Vascular/Lymphatic: Aortoiliac atherosclerotic vascular disease. Scattered small retroperitoneal lymph nodes. Reproductive: Unremarkable Other: Diffuse subcutaneous and mesenteric edema, query generalized third spacing of fluid. Edema is most notable along the lower anterior pelvic wall and pubis region. Musculoskeletal: Lumbar spondylosis and degenerative disc disease. IMPRESSION: 1. Small bowel obstruction due to a peristomal herniation of small bowel just above the descending colostomy. Small bowel dilated up to 4.1 cm in the stomach is distended. 2. Abnormal appearance the kidneys, with retained contrast medium in the renal cortex bilaterally, cannot exclude acute renal failure or contrast nephropathy causing this appearance. The patient last received IV contrast about 33 hours prior to today's scan, as best I know. 3. Stable large and possibly loculated pericardial effusion with extensive coronary atherosclerosis, calcified mitral valve, and moderate cardiomegaly. 4.  Aortoiliac  atherosclerotic vascular disease. 5. Third spacing of fluid with generalized mesenteric and subcutaneous edema. 6. I do not see any renal stones but sensitivity for nonobstructive stones is low due to the presence of contrast medium within the urinary tract left over from yesterday. Electronically Signed   By: Van Clines M.D.   On: 09/24/2015 20:14   Dg Hip Unilat W Or Wo Pelvis 2-3 Views Left  Result Date: 10/08/2015 CLINICAL DATA:  [place] tonight. EXAM: DG HIP (WITH OR WITHOUT PELVIS) 2-3V LEFT COMPARISON:  None. FINDINGS: There is an acute intertrochanteric left hip fracture with mild varus angulation. No dislocation. No radiographic findings to suggest a pathologic basis for the fracture. IMPRESSION: Intertrochanteric left hip fracture. Electronically Signed   By: Andreas [place] M.D.   On: 10/08/2015 02:28   Case d/w Dr. Tomie China admit to [facility]    Medications  magnesium sulfate IVPB 1 g 100 mL (not administered)  potassium chloride SA (K-DUR,KLOR-CON) CR tablet 80 mEq (not administered)  potassium chloride 10 mEq in 100 mL IVPB (not administered)  fentaNYL (SUBLIMAZE) injection 50 mcg (not administered)  fentaNYL (SUBLIMAZE) injection 50 mcg (50 mcg Intravenous Given 10/08/15 0228)    COORDINATION OF CARE:  1:34 AM Discussed treatment plan with pt at bedside and pt agreed to plan.   Labs (all labs ordered are listed, but only abnormal results are displayed) Labs Reviewed - No data to display   Radiology No results found.  Procedures Procedures (including critical care time)  Medications Ordered in ED Medications - No data to display   EKG Interpretation  Date/Time:  Tuesday October 08 2015 01:20:48 EDT Ventricular Rate:  64 PR Interval:    QRS Duration: 167 QT Interval:  503 QTC Calculation: 519 R Axis:   -87 Text Interpretation:  Atrial-sensed  ventricular-paced rhythm No further analysis attempted due to paced rhythm Confirmed by [facility]  MD,  Kamren Heintzelman ([id number]) on 10/08/2015 3:41:52 AM     MDM Reviewed: previous chart, nursing note and vitals Interpretation: labs, ECG and x-ray (potassium low mag is low end of normal elevated INR broken hip by me on xray) Total time providing critical care: 30-74 minutes. This excludes time spent performing separately reportable procedures and services. Consults: admitting MD and orthopedics  CRITICAL CARE Performed by: Carlisle Beers Total critical care time: 61 minutes Critical care time was exclusive of separately billable procedures and treating other patients. Critical care was necessary to treat or prevent imminent or life-threatening deterioration. Critical care was time spent personally by me on the following activities: development of treatment plan with patient and/or surrogate as well as nursing, discussions with consultants, evaluation of patient's response to treatment, examination of patient, obtaining history from patient or surrogate, ordering and performing treatments and interventions, ordering and review of laboratory studies, ordering and review of radiographic studies, pulse oximetry and re-evaluation of patient's condition.   Initial Impression / Assessment and Plan / ED Course  I have reviewed the triage vital signs and the nursing notes.  Pertinent labs & imaging results that were available during my care of the patient were reviewed by me and considered in my medical decision making (see chart for details).   Final Clinical Impressions(s) / ED Diagnoses   Final diagnoses:  None   I personally performed the services described in this documentation, which was scribed in my presence. The recorded information has been reviewed and is accurate.   New Prescriptions New Prescriptions   No medications on file     Talal Fritchman, MD 10/08/15 [phone]

## 2015-10-08 NOTE — ED Notes (Signed)
[facility] called. 

## 2015-10-08 NOTE — Progress Notes (Signed)
A foley catheter 41fr was attempted at 1345. Patient was in minor-moderate pain due to shifting her left fractured hip, but she tolerated the procedure well.

## 2015-10-08 NOTE — Progress Notes (Signed)
Patient arrive in the unit at 05:50 am accompanied by three [facility]  personnel. Orientation to the unit given. Patient  verbalizes understanding.

## 2015-10-08 NOTE — Care Management Note (Signed)
Case Management Note  Patient Details  Name: [name] MRN: [id number] Date of Birth: 12-May-1923  Subjective/Objective:     CM following for progression and d/c planning.                Action/Plan: 10/08/2015 Following for d/c planning.  Await surgery and PT/OT eval to determine d/c needs.   Expected Discharge Date:                  Expected Discharge Plan:   (Unknown)  In-House Referral:  Clinical Social Work  Discharge planning Services  CM Consult  Post Acute Care Choice:  NA Choice offered to:  NA  DME Arranged:    DME Agency:     HH Arranged:    HH Agency:     Status of Service:  In process, will continue to follow  If discussed at Long Length of Stay Meetings, dates discussed:    Additional Comments:  Adron Bene, RN 10/08/2015, 2:36 PM

## 2015-10-09 ENCOUNTER — Encounter (HOSPITAL_COMMUNITY): Payer: Self-pay | Admitting: Surgery

## 2015-10-09 ENCOUNTER — Encounter (HOSPITAL_COMMUNITY)
Admission: EM | Disposition: A | Discharge status: Hospice | Payer: Self-pay | Source: Home / Self Care | Attending: Internal Medicine

## 2015-10-09 ENCOUNTER — Inpatient Hospital Stay (HOSPITAL_COMMUNITY): Payer: Medicare Other

## 2015-10-09 ENCOUNTER — Inpatient Hospital Stay (HOSPITAL_COMMUNITY): Payer: Medicare Other | Admitting: Anesthesiology

## 2015-10-09 ENCOUNTER — Other Ambulatory Visit: Payer: Medicare Other

## 2015-10-09 HISTORY — PX: INTRAMEDULLARY (IM) NAIL INTERTROCHANTERIC: SHX5875

## 2015-10-09 LAB — PREPARE FRESH FROZEN PLASMA: UNIT DIVISION: 0

## 2015-10-09 LAB — URINALYSIS, ROUTINE W REFLEX MICROSCOPIC
BILIRUBIN URINE: NEGATIVE
Glucose, UA: NEGATIVE mg/dL
Ketones, ur: NEGATIVE mg/dL
NITRITE: NEGATIVE
Protein, ur: 100 mg/dL — AB
SPECIFIC GRAVITY, URINE: 1.013 (ref 1.005–1.030)
pH: 5.5 (ref 5.0–8.0)

## 2015-10-09 LAB — CBC
HEMATOCRIT: 29 % — AB (ref 36.0–46.0)
Hemoglobin: 8.3 g/dL — ABNORMAL LOW (ref 12.0–15.0)
MCH: 20.9 pg — ABNORMAL LOW (ref 26.0–34.0)
MCHC: 28.6 g/dL — AB (ref 30.0–36.0)
MCV: 72.9 fL — ABNORMAL LOW (ref 78.0–100.0)
Platelets: 225 10*3/uL (ref 150–400)
RBC: 3.98 MIL/uL (ref 3.87–5.11)
RDW: 16.6 % — AB (ref 11.5–15.5)
WBC: 7.9 10*3/uL (ref 4.0–10.5)

## 2015-10-09 LAB — BASIC METABOLIC PANEL
Anion gap: 8 (ref 5–15)
BUN: 18 mg/dL (ref 6–20)
CALCIUM: 8.9 mg/dL (ref 8.9–10.3)
CO2: 32 mmol/L (ref 22–32)
CREATININE: 1.35 mg/dL — AB (ref 0.44–1.00)
Chloride: 95 mmol/L — ABNORMAL LOW (ref 101–111)
GFR calc Af Amer: 39 mL/min — ABNORMAL LOW (ref >60)
GFR, EST NON AFRICAN AMERICAN: 33 mL/min — AB (ref >60)
GLUCOSE: 110 mg/dL — AB (ref 65–99)
Potassium: 3.5 mmol/L (ref 3.5–5.1)
Sodium: 135 mmol/L (ref 135–145)

## 2015-10-09 LAB — URINE MICROSCOPIC-ADD ON: SQUAMOUS EPITHELIAL / LPF: NONE SEEN

## 2015-10-09 LAB — PROTIME-INR
INR: 1.51
PROTHROMBIN TIME: 18.3 s — AB (ref 11.4–15.2)

## 2015-10-09 LAB — DIGOXIN LEVEL: DIGOXIN LVL: 0.8 ng/mL (ref 0.8–2.0)

## 2015-10-09 SURGERY — FIXATION, FRACTURE, INTERTROCHANTERIC, WITH INTRAMEDULLARY ROD
Anesthesia: General | Laterality: Left

## 2015-10-09 MED ORDER — CHLORHEXIDINE GLUCONATE CLOTH 2 % EX PADS
6.0000 | MEDICATED_PAD | Freq: Once | CUTANEOUS | Status: AC
  Administered 2015-10-09: 6 via TOPICAL

## 2015-10-09 MED ORDER — VANCOMYCIN HCL IN DEXTROSE 1-5 GM/200ML-% IV SOLN
INTRAVENOUS | Status: AC
  Filled 2015-10-09: qty 200

## 2015-10-09 MED ORDER — LIDOCAINE HCL (CARDIAC) 20 MG/ML IV SOLN
INTRAVENOUS | Status: DC | PRN
  Administered 2015-10-09: 40 mg via INTRAVENOUS

## 2015-10-09 MED ORDER — ALBUTEROL SULFATE (2.5 MG/3ML) 0.083% IN NEBU
5.0000 mg | INHALATION_SOLUTION | Freq: Once | RESPIRATORY_TRACT | Status: AC
  Administered 2015-10-09: 5 mg via RESPIRATORY_TRACT

## 2015-10-09 MED ORDER — EPHEDRINE 5 MG/ML INJ
INTRAVENOUS | Status: AC
Start: 2015-10-09 — End: 2015-10-09
  Filled 2015-10-09: qty 10

## 2015-10-09 MED ORDER — OXYCODONE HCL 5 MG PO TABS
5.0000 mg | ORAL_TABLET | ORAL | Status: DC | PRN
  Administered 2015-10-10: 10 mg via ORAL
  Administered 2015-10-11: 5 mg via ORAL
  Filled 2015-10-09: qty 2
  Filled 2015-10-09: qty 1
  Filled 2015-10-09: qty 2

## 2015-10-09 MED ORDER — ONDANSETRON HCL 4 MG/2ML IJ SOLN: 4.0000 mg | Freq: Four times a day (QID) | INTRAMUSCULAR | Status: DC | PRN

## 2015-10-09 MED ORDER — GLYCOPYRROLATE 0.2 MG/ML IJ SOLN
INTRAMUSCULAR | Status: DC | PRN
  Administered 2015-10-09: 0.6 mg via INTRAVENOUS

## 2015-10-09 MED ORDER — METHOCARBAMOL 1000 MG/10ML IJ SOLN: 500.0000 mg | Freq: Once | INTRAVENOUS | Status: DC

## 2015-10-09 MED ORDER — PHENYLEPHRINE HCL 10 MG/ML IJ SOLN
INTRAMUSCULAR | Status: DC | PRN
  Administered 2015-10-09: 40 ug/min via INTRAVENOUS

## 2015-10-09 MED ORDER — LORAZEPAM 2 MG/ML IJ SOLN
0.5000 mg | Freq: Four times a day (QID) | INTRAMUSCULAR | Status: DC | PRN
  Administered 2015-10-09 – 2015-10-18 (×14): 0.5 mg via INTRAVENOUS
  Filled 2015-10-09 (×15): qty 1

## 2015-10-09 MED ORDER — HYDROCODONE-ACETAMINOPHEN 5-325 MG PO TABS
1.0000 | ORAL_TABLET | Freq: Four times a day (QID) | ORAL | Status: DC | PRN
  Administered 2015-10-10: 2 via ORAL
  Administered 2015-10-10: 1 via ORAL
  Administered 2015-10-10: 2 via ORAL
  Filled 2015-10-09 (×2): qty 2
  Filled 2015-10-09: qty 1

## 2015-10-09 MED ORDER — CEFAZOLIN SODIUM-DEXTROSE 2-4 GM/100ML-% IV SOLN
2.0000 g | Freq: Two times a day (BID) | INTRAVENOUS | Status: AC
  Administered 2015-10-10 (×2): 2 g via INTRAVENOUS
  Filled 2015-10-09 (×2): qty 100

## 2015-10-09 MED ORDER — POTASSIUM CHLORIDE 10 MEQ/100ML IV SOLN
10.0000 meq | Freq: Once | INTRAVENOUS | Status: AC
  Administered 2015-10-09: 10 meq via INTRAVENOUS
  Filled 2015-10-09: qty 100

## 2015-10-09 MED ORDER — GLYCOPYRROLATE 0.2 MG/ML IV SOSY
PREFILLED_SYRINGE | INTRAVENOUS | Status: AC
  Filled 2015-10-09: qty 3

## 2015-10-09 MED ORDER — ALBUTEROL SULFATE (2.5 MG/3ML) 0.083% IN NEBU
INHALATION_SOLUTION | RESPIRATORY_TRACT | Status: AC
  Filled 2015-10-09: qty 6

## 2015-10-09 MED ORDER — FENTANYL CITRATE (PF) 100 MCG/2ML IJ SOLN
INTRAMUSCULAR | Status: DC | PRN
  Administered 2015-10-09: 25 ug via INTRAVENOUS

## 2015-10-09 MED ORDER — MENTHOL 3 MG MT LOZG: 1.0000 | LOZENGE | OROMUCOSAL | Status: DC | PRN

## 2015-10-09 MED ORDER — METOCLOPRAMIDE HCL 5 MG/ML IJ SOLN: 5.0000 mg | Freq: Three times a day (TID) | INTRAMUSCULAR | Status: DC | PRN

## 2015-10-09 MED ORDER — VITAMIN D 1000 UNITS PO TABS
5000.0000 [IU] | ORAL_TABLET | Freq: Every day | ORAL | Status: DC
  Administered 2015-10-10 – 2015-10-15 (×2): 5000 [IU] via ORAL
  Filled 2015-10-09 (×4): qty 5

## 2015-10-09 MED ORDER — PROPOFOL 10 MG/ML IV BOLUS
INTRAVENOUS | Status: DC | PRN
  Administered 2015-10-09: 80 mg via INTRAVENOUS

## 2015-10-09 MED ORDER — ACETAMINOPHEN 500 MG PO TABS: 500.0000 mg | ORAL_TABLET | Freq: Three times a day (TID) | ORAL | Status: DC | PRN

## 2015-10-09 MED ORDER — ROCURONIUM BROMIDE 100 MG/10ML IV SOLN
INTRAVENOUS | Status: DC | PRN
  Administered 2015-10-09: 50 mg via INTRAVENOUS

## 2015-10-09 MED ORDER — ALUM & MAG HYDROXIDE-SIMETH 200-200-20 MG/5ML PO SUSP: 30.0000 mL | ORAL | Status: DC | PRN

## 2015-10-09 MED ORDER — 0.9 % SODIUM CHLORIDE (POUR BTL) OPTIME
TOPICAL | Status: DC | PRN
  Administered 2015-10-09: 1000 mL

## 2015-10-09 MED ORDER — NEOSTIGMINE METHYLSULFATE 10 MG/10ML IV SOLN
INTRAVENOUS | Status: DC | PRN
  Administered 2015-10-09: 4 mg via INTRAVENOUS

## 2015-10-09 MED ORDER — PRO-STAT SUGAR FREE PO LIQD
30.0000 mL | Freq: Two times a day (BID) | ORAL | Status: DC
  Administered 2015-10-10 – 2015-10-14 (×2): 30 mL via ORAL
  Filled 2015-10-09 (×5): qty 30

## 2015-10-09 MED ORDER — ACETAMINOPHEN 10 MG/ML IV SOLN
INTRAVENOUS | Status: AC
  Filled 2015-10-09: qty 100

## 2015-10-09 MED ORDER — ADULT MULTIVITAMIN W/MINERALS CH
1.0000 | ORAL_TABLET | Freq: Every day | ORAL | Status: DC
  Administered 2015-10-10 – 2015-10-15 (×2): 1 via ORAL
  Filled 2015-10-09 (×3): qty 1

## 2015-10-09 MED ORDER — METHOCARBAMOL 500 MG PO TABS
500.0000 mg | ORAL_TABLET | Freq: Four times a day (QID) | ORAL | Status: DC | PRN
  Administered 2015-10-10 – 2015-10-15 (×3): 500 mg via ORAL
  Filled 2015-10-09 (×4): qty 1

## 2015-10-09 MED ORDER — TORSEMIDE 20 MG PO TABS: 20.0000 mg | ORAL_TABLET | Freq: Every day | ORAL | Status: DC

## 2015-10-09 MED ORDER — PHENYLEPHRINE HCL 10 MG/ML IJ SOLN
INTRAMUSCULAR | Status: DC | PRN
  Administered 2015-10-09 (×2): 80 ug via INTRAVENOUS

## 2015-10-09 MED ORDER — ACETAMINOPHEN 10 MG/ML IV SOLN
1000.0000 mg | Freq: Once | INTRAVENOUS | Status: AC
Start: 2015-10-09 — End: 2015-10-09
  Administered 2015-10-09: 1000 mg via INTRAVENOUS

## 2015-10-09 MED ORDER — VANCOMYCIN HCL 1000 MG IV SOLR
INTRAVENOUS | Status: DC | PRN
  Administered 2015-10-09: 1000 mg via INTRAVENOUS

## 2015-10-09 MED ORDER — POTASSIUM CHLORIDE 10 MEQ/100ML IV SOLN
10.0000 meq | INTRAVENOUS | Status: AC
  Administered 2015-10-09 (×4): 10 meq via INTRAVENOUS
  Filled 2015-10-09 (×4): qty 100

## 2015-10-09 MED ORDER — MORPHINE SULFATE (PF) 2 MG/ML IV SOLN
0.5000 mg | INTRAVENOUS | Status: DC | PRN
  Administered 2015-10-10 – 2015-10-13 (×10): 0.5 mg via INTRAVENOUS
  Filled 2015-10-09 (×10): qty 1

## 2015-10-09 MED ORDER — METHOCARBAMOL 1000 MG/10ML IJ SOLN
500.0000 mg | Freq: Four times a day (QID) | INTRAVENOUS | Status: DC | PRN
  Administered 2015-10-09 – 2015-10-20 (×13): 500 mg via INTRAVENOUS
  Filled 2015-10-09 (×19): qty 5

## 2015-10-09 MED ORDER — SODIUM CHLORIDE 0.9 % IV SOLN
INTRAVENOUS | Status: DC
  Administered 2015-10-09 – 2015-10-10 (×2): via INTRAVENOUS

## 2015-10-09 MED ORDER — PHENOL 1.4 % MT LIQD: 1.0000 | OROMUCOSAL | Status: DC | PRN

## 2015-10-09 MED ORDER — FENTANYL CITRATE (PF) 100 MCG/2ML IJ SOLN
INTRAMUSCULAR | Status: AC
  Filled 2015-10-09: qty 2

## 2015-10-09 MED ORDER — PROPOFOL 10 MG/ML IV BOLUS
INTRAVENOUS | Status: AC
  Filled 2015-10-09: qty 20

## 2015-10-09 MED ORDER — DEXTROSE 5 % IV SOLN: 2.0000 g | INTRAVENOUS | Status: DC

## 2015-10-09 MED ORDER — ROCURONIUM BROMIDE 10 MG/ML (PF) SYRINGE
PREFILLED_SYRINGE | INTRAVENOUS | Status: AC
  Filled 2015-10-09: qty 10

## 2015-10-09 MED ORDER — ONDANSETRON HCL 4 MG/2ML IJ SOLN
INTRAMUSCULAR | Status: DC | PRN
  Administered 2015-10-09: 4 mg via INTRAVENOUS

## 2015-10-09 MED ORDER — ACETAMINOPHEN 650 MG RE SUPP: 650.0000 mg | Freq: Four times a day (QID) | RECTAL | Status: DC | PRN

## 2015-10-09 MED ORDER — CHLORHEXIDINE GLUCONATE CLOTH 2 % EX PADS: 6.0000 | MEDICATED_PAD | Freq: Once | CUTANEOUS | Status: DC

## 2015-10-09 MED ORDER — ENOXAPARIN SODIUM 40 MG/0.4ML ~~LOC~~ SOLN
40.0000 mg | SUBCUTANEOUS | Status: DC
  Administered 2015-10-10: 40 mg via SUBCUTANEOUS
  Filled 2015-10-09: qty 0.4

## 2015-10-09 MED ORDER — PHENYLEPHRINE 40 MCG/ML (10ML) SYRINGE FOR IV PUSH (FOR BLOOD PRESSURE SUPPORT)
PREFILLED_SYRINGE | INTRAVENOUS | Status: AC
Start: 2015-10-09 — End: 2015-10-09
  Filled 2015-10-09: qty 20

## 2015-10-09 MED ORDER — NEOSTIGMINE METHYLSULFATE 5 MG/5ML IV SOSY
PREFILLED_SYRINGE | INTRAVENOUS | Status: AC
  Filled 2015-10-09: qty 5

## 2015-10-09 MED ORDER — METOCLOPRAMIDE HCL 5 MG PO TABS: 5.0000 mg | ORAL_TABLET | Freq: Three times a day (TID) | ORAL | Status: DC | PRN

## 2015-10-09 MED ORDER — LACTATED RINGERS IV SOLN
INTRAVENOUS | Status: DC
  Administered 2015-10-09 (×2): via INTRAVENOUS

## 2015-10-09 MED ORDER — ALVIMOPAN 12 MG PO CAPS
12.0000 mg | ORAL_CAPSULE | Freq: Once | ORAL | Status: DC
  Filled 2015-10-09: qty 1

## 2015-10-09 MED ORDER — ENOXAPARIN SODIUM 40 MG/0.4ML ~~LOC~~ SOLN: 40.0000 mg | Freq: Every day | SUBCUTANEOUS | 0 refills | Status: DC

## 2015-10-09 MED ORDER — ONDANSETRON HCL 4 MG PO TABS: 4.0000 mg | ORAL_TABLET | Freq: Four times a day (QID) | ORAL | Status: DC | PRN

## 2015-10-09 MED ORDER — ACETAMINOPHEN 325 MG PO TABS: 650.0000 mg | ORAL_TABLET | Freq: Four times a day (QID) | ORAL | Status: DC | PRN

## 2015-10-09 MED ORDER — EPHEDRINE SULFATE 50 MG/ML IJ SOLN
INTRAMUSCULAR | Status: DC | PRN
  Administered 2015-10-09: 10 mg via INTRAVENOUS

## 2015-10-09 MED ORDER — LIDOCAINE 2% (20 MG/ML) 5 ML SYRINGE
INTRAMUSCULAR | Status: AC
  Filled 2015-10-09: qty 5

## 2015-10-09 MED ORDER — HYDROCODONE-ACETAMINOPHEN 7.5-325 MG PO TABS: 1.0000 | ORAL_TABLET | Freq: Four times a day (QID) | ORAL | 0 refills | Status: DC | PRN

## 2015-10-09 SURGICAL SUPPLY — 44 items
BLADE SURG 15 STRL LF DISP TIS (BLADE) ×1 IMPLANT
BLADE SURG 15 STRL SS (BLADE) ×3
BNDG COHESIVE 4X5 TAN NS LF (GAUZE/BANDAGES/DRESSINGS) ×3 IMPLANT
BNDG COHESIVE 6X5 TAN STRL LF (GAUZE/BANDAGES/DRESSINGS) IMPLANT
BNDG GAUZE ELAST 4 BULKY (GAUZE/BANDAGES/DRESSINGS) ×3 IMPLANT
COVER PERINEAL POST (MISCELLANEOUS) ×3 IMPLANT
COVER SURGICAL LIGHT HANDLE (MISCELLANEOUS) ×3 IMPLANT
DRAPE PROXIMA HALF (DRAPES) IMPLANT
DRAPE STERI IOBAN 125X83 (DRAPES) ×3 IMPLANT
DRSG MEPILEX BORDER 4X4 (GAUZE/BANDAGES/DRESSINGS) ×5 IMPLANT
DRSG MEPILEX BORDER 4X8 (GAUZE/BANDAGES/DRESSINGS) ×3 IMPLANT
DRSG PAD ABDOMINAL 8X10 ST (GAUZE/BANDAGES/DRESSINGS) ×6 IMPLANT
DURAPREP 26ML APPLICATOR (WOUND CARE) ×3 IMPLANT
ELECT CAUTERY BLADE 6.4 (BLADE) ×3 IMPLANT
ELECT REM PT RETURN 9FT ADLT (ELECTROSURGICAL) ×3
ELECTRODE REM PT RTRN 9FT ADLT (ELECTROSURGICAL) ×1 IMPLANT
FACESHIELD WRAPAROUND (MASK) ×3 IMPLANT
FACESHIELD WRAPAROUND OR TEAM (MASK) ×1 IMPLANT
GAUZE XEROFORM 5X9 LF (GAUZE/BANDAGES/DRESSINGS) ×3 IMPLANT
GLOVE SKINSENSE NS SZ7.5 (GLOVE) ×4
GLOVE SKINSENSE STRL SZ7.5 (GLOVE) ×2 IMPLANT
GOWN STRL REIN XL XLG (GOWN DISPOSABLE) ×3 IMPLANT
GUIDE PIN 3.2X343 (PIN) ×1
GUIDE PIN 3.2X343MM (PIN) ×3
KIT BASIN OR (CUSTOM PROCEDURE TRAY) ×3 IMPLANT
KIT ROOM TURNOVER OR (KITS) ×3 IMPLANT
LINER BOOT UNIVERSAL DISP (MISCELLANEOUS) ×3 IMPLANT
MANIFOLD NEPTUNE II (INSTRUMENTS) ×3 IMPLANT
NAIL TRIGEN LEFT 10X38-125 (Nail) ×2 IMPLANT
NS IRRIG 1000ML POUR BTL (IV SOLUTION) ×3 IMPLANT
PACK GENERAL/GYN (CUSTOM PROCEDURE TRAY) ×3 IMPLANT
PAD ARMBOARD 7.5X6 YLW CONV (MISCELLANEOUS) ×6 IMPLANT
PAD CAST 4YDX4 CTTN HI CHSV (CAST SUPPLIES) ×2 IMPLANT
PADDING CAST COTTON 4X4 STRL (CAST SUPPLIES) ×6
PIN GUIDE 3.2X343MM (PIN) IMPLANT
SCREW LAG COMPR KIT 90/85 (Screw) ×2 IMPLANT
STAPLER VISISTAT 35W (STAPLE) ×3 IMPLANT
SUT VIC AB 0 CT1 27 (SUTURE) ×6
SUT VIC AB 0 CT1 27XBRD ANBCTR (SUTURE) ×2 IMPLANT
SUT VIC AB 2-0 CT1 27 (SUTURE) ×6
SUT VIC AB 2-0 CT1 TAPERPNT 27 (SUTURE) ×2 IMPLANT
TOWEL OR 17X24 6PK STRL BLUE (TOWEL DISPOSABLE) ×3 IMPLANT
TOWEL OR 17X26 10 PK STRL BLUE (TOWEL DISPOSABLE) ×3 IMPLANT
WATER STERILE IRR 1000ML POUR (IV SOLUTION) ×3 IMPLANT

## 2015-10-09 NOTE — Clinical Social Work Note (Signed)
CSW acknowledges hip fracture consult. Patient in need of PT evaluation before disposition needs can be determined. CSW will continue to follow.  [name], [place]

## 2015-10-09 NOTE — Anesthesia Preprocedure Evaluation (Addendum)
Anesthesia Evaluation  Patient identified by MRN, date of birth, ID band Patient awake    Reviewed: Allergy & Precautions, NPO status , Patient's Chart, lab work & pertinent test results  Airway Mallampati: II  TM Distance: >3 FB Neck ROM: Full    Dental  (+) Dental Advisory Given, Missing   Pulmonary sleep apnea ,     + decreased breath sounds      Cardiovascular hypertension, + Peripheral Vascular Disease and +CHF  + dysrhythmias Atrial Fibrillation + pacemaker  Rhythm:Irregular Rate:Normal     Neuro/Psych  Neuromuscular disease CVA negative psych ROS   GI/Hepatic negative GI ROS, Neg liver ROS,   Endo/Other  diabetes  Renal/GU Renal disease  negative genitourinary   Musculoskeletal  (+) Arthritis ,   Abdominal Normal abdominal exam  (+)   Peds  Hematology   Anesthesia Other Findings   Reproductive/Obstetrics negative OB ROS                           Anesthesia Physical Anesthesia Plan  ASA: III  Anesthesia Plan: General   Post-op Pain Management:    Induction: Intravenous  Airway Management Planned: Oral ETT  Additional Equipment:   Intra-op Plan:   Post-operative Plan: Extubation in OR  Informed Consent: I have reviewed the patients History and Physical, chart, labs and discussed the procedure including the risks, benefits and alternatives for the proposed anesthesia with the patient or authorized representative who has indicated his/her understanding and acceptance.   Dental advisory given  Plan Discussed with: CRNA  Anesthesia Plan Comments:         Anesthesia Quick Evaluation

## 2015-10-09 NOTE — Progress Notes (Signed)
PROGRESS NOTE  [name] PARDE  [id number] DOB: Feb 22, 1923  DOA: 10/08/2015 PCP: [name], MD  Primary Cardiologist: Dr. Sanda Klein  Subjective: Seen with her 2 daughters at bedside, they were trying to clean her up as her colostomy bag was leaking.  Brief Narrative:  82 year old female with a PMH of chronic A. fib on Coumadin, chronic diastolic CHF, 2-D echo [id number] with LVEF 55-60 percent, HTN, pericardial effusion status post pericardial window 01/16/15, stroke, tachybradycardia syndrome status post pacemaker, anemia, status post recent hospitalization 09/24/15-09/30/15 for urgent sigmoid colectomy with Hartmann's procedure and small bowel resection on 05/31/15 following which transferred to inpatient rehabilitation and then transitioned home, awaiting colostomy reversal/repair of parastomal hernia, presented to ED on 10/08/15 following a mechanical fall at home and sustained left hip pain, right wrist and left forearm pain. Workup revealed left intertrochanteric hip fracture, potassium 2.2. Orthopedics/Dr. Erlinda Hong was consulted and plan for surgery on 10/09/15 after medical optimization.   Assessment & Plan:   Principal Problem:   Closed left hip fracture [facility]) Active Problems:   Pacemaker   Paroxysmal atrial fibrillation (HCC)   Hypokalemia   Anemia   Fall   Left intertrochanteric hip fracture - Sustained following mechanical fall - Orthopedic consultation appreciated. Plan for surgery on 10/09/15. - Holding Coumadin and reversing INR with vitamin K and FFP ordered by orthopedics. -As per Dr. Erlinda Hong, this appears to be a fragility fracture and needs further evaluation as per his recommendations which can be carried out as outpatient. - Based on available data, patient is at moderate risk for perioperative CV events but may proceed with indicated surgery without any further cardiac evaluation with usual close perioperative monitoring and even in and out fluid balance. - Patient is going  for surgery today.  Chronic atrial fibrillation/tachybradycardia syndrome status post pacemaker - CHADS2Vasc 7. INR therapeutic at 2.9 on admission. - Continue digoxin and verapamil - Warfarin held and INR is 1.5.  Chronic diastolic CHF - Clinically appears euvolemic, continue current medications.  Acute on stage III chronic kidney disease - Creatinine at recent discharge 09/30/15:1.2. Now presented with creatinine of 1.5.  - Briefly hydrated with IV fluids. Chest x-ray suggests pulmonary congestion. DC IV fluids.  Hypokalemia - Potassium 2.2 on admission. Mg: 1.7. Replacing aggressively. Follow BMP and magnesium. - Potassium improved to 3.5 with multiple rounds of oral and IV potassium. Repeat oral potassium and check BMP in a.m.  Essential hypertension - Controlled.  Anemia - Stable. Follow CBCs postoperatively.   DVT prophylaxis: SCD's Code Status: Full Family Communication: None at bedside. Discussed with patient. Disposition Plan: Likely SNF when medically ready.   Consultants:   Orthopedics  Procedures:   Foley  Antimicrobials:   None     Objective:  Vitals:   10/08/15 1745 10/08/15 2021 10/09/15 0629 10/09/15 0932  BP: (!) 114/48 (!) 115/58 (!) 108/59 125/70  Pulse: (!) 59 64 65 71  Resp: 19 18 18  (!) 23  Temp: 98.9 F (37.2 C) 99 F (37.2 C) 99 F (37.2 C) 99 F (37.2 C)  TempSrc: Oral Oral Oral Oral  SpO2: 97% 98% 93% 98%  Weight:   71.7 kg (158 lb)   Height:        Intake/Output Summary (Last 24 hours) at 10/09/15 1225 Last data filed at 10/09/15 1045  Gross per 24 hour  Intake             1586 ml  Output  1430 ml  Net              156 ml   Filed Weights   10/08/15 0600 10/09/15 0629  Weight: 70.4 kg (155 lb 1.6 oz) 71.7 kg (158 lb)    Examination:  General exam: Pleasant elderly frail female lying comfortably supine in bed.  Respiratory system: Clear to auscultation. Respiratory effort normal. Cardiovascular system: S1  & S2 heard, Irregularly irregular. No JVD, murmurs, rubs, gallops or clicks. No pedal edema. V paced.  Gastrointestinal system: Abdomen is nondistended, soft and nontender. No organomegaly or masses felt. Normal bowel sounds heard. Central nervous system: Alert and oriented. No focal neurological deficits. Extremities: Symmetric 5 x 5 power except left lower extremity where movements are restricted due to pain.. Skin: No rashes, lesions or ulcers Psychiatry: Judgement and insight appear normal. Mood & affect appropriate.     Data Reviewed: I have personally reviewed following labs and imaging studies  CBC:  Recent Labs Lab 10/08/15 0230 10/08/15 0241 10/09/15 0754  WBC 5.7  --  7.9  NEUTROABS 4.0  --   --   HGB 9.4* 10.5* 8.3*  HCT 30.6* 31.0* 29.0*  MCV 70.3*  --  72.9*  PLT 253  --  123456   Basic Metabolic Panel:  Recent Labs Lab 10/08/15 0241 10/08/15 0334 10/08/15 1956 10/09/15 0754  NA 139  --  136 135  K 2.3* 2.2* 2.6* 3.5  CL 91*  --  93* 95*  CO2  --   --  33* 32  GLUCOSE 123*  --  160* 110*  BUN 21*  --  18 18  CREATININE 1.50*  --  1.39* 1.35*  CALCIUM  --   --  8.9 8.9  MG  --  1.7 1.9  --    GFR: Estimated Creatinine Clearance: 26.9 mL/min (by C-G formula based on SCr of 1.35 mg/dL (H)). Liver Function Tests: No results for input(s): AST, ALT, ALKPHOS, BILITOT, PROT, ALBUMIN in the last 168 hours. No results for input(s): LIPASE, AMYLASE in the last 168 hours. No results for input(s): AMMONIA in the last 168 hours. Coagulation Profile:  Recent Labs Lab 10/08/15 0230 10/09/15 0754  INR 2.90 1.51   Cardiac Enzymes: No results for input(s): CKTOTAL, CKMB, CKMBINDEX, TROPONINI in the last 168 hours. BNP (last 3 results) No results for input(s): PROBNP in the last 8760 hours. HbA1C: No results for input(s): HGBA1C in the last 72 hours. CBG: No results for input(s): GLUCAP in the last 168 hours. Lipid Profile: No results for input(s): CHOL, HDL,  LDLCALC, TRIG, CHOLHDL, LDLDIRECT in the last 72 hours. Thyroid Function Tests: No results for input(s): TSH, T4TOTAL, FREET4, T3FREE, THYROIDAB in the last 72 hours. Anemia Panel: No results for input(s): VITAMINB12, FOLATE, FERRITIN, TIBC, IRON, RETICCTPCT in the last 72 hours.  Sepsis Labs: No results for input(s): PROCALCITON, LATICACIDVEN in the last 168 hours.  Recent Results (from the past 240 hour(s))  MRSA PCR Screening     Status: Abnormal   Collection Time: 10/08/15  3:59 PM  Result Value Ref Range Status   MRSA by PCR POSITIVE (A) NEGATIVE Final    Comment:        The GeneXpert MRSA Assay (FDA approved for NASAL specimens only), is one component of a comprehensive MRSA colonization surveillance program. It is not intended to diagnose MRSA infection nor to guide or monitor treatment for MRSA infections. CRITICAL RESULT CALLED TO, READ BACK BY AND VERIFIED WITH: Stasia Cavalier, RN  AT 1957 ON 10/08/15 BY C. Lehrke, MLT.          Radiology Studies: Dg Chest 2 View  Result Date: 10/08/2015 CLINICAL DATA:  Fall tonight, on anticoagulation. EXAM: CHEST  2 VIEW COMPARISON:  Chest radiograph 09/28/2015, included lung bases from CT abdomen/pelvis 09/24/2015. FINDINGS: Multi lead right-sided pacemaker. Larger into the cardiac silhouette. Abnormal 1 density abutting the right heart border likely secondary to large pericardial effusion as seen on recent CT. Small bilateral pleural effusions. There is mild vascular congestion. No pneumothorax or new parenchymal opacity. No acute osseous abnormalities are seen, thoracic spine obscured on the lateral view. IMPRESSION: 1. Development of vascular congestion. Stable enlarged cardiac silhouette, pericardial effusion noted on recent prior abdominal CTs. 2. Small bilateral pleural effusions. Electronically Signed   By: Jeb Levering M.D.   On: 10/08/2015 02:32   Dg Forearm Left  Result Date: 10/08/2015 CLINICAL DATA:  [place] tonight  EXAM: LEFT FOREARM - 2 VIEW COMPARISON:  None. FINDINGS: There is no evidence of fracture or other focal bone lesions. Soft tissues are unremarkable. IMPRESSION: Negative. Electronically Signed   By: Andreas Newport M.D.   On: 10/08/2015 02:28   Dg Wrist Complete Right  Result Date: 10/08/2015 CLINICAL DATA:  [place] tonight EXAM: RIGHT WRIST - COMPLETE 3+ VIEW COMPARISON:  None. FINDINGS: There is no evidence of fracture or dislocation. There is no evidence of arthropathy or other focal bone abnormality. Soft tissues are unremarkable. IMPRESSION: Negative. Electronically Signed   By: Andreas [place] M.D.   On: 10/08/2015 02:29   Ct Head Wo Contrast  Result Date: 10/08/2015 CLINICAL DATA:  [place] today. EXAM: CT HEAD WITHOUT CONTRAST TECHNIQUE: Contiguous axial images were obtained from the base of the skull through the vertex without intravenous contrast. COMPARISON:  05/18/2011 FINDINGS: Brain: There is no intracranial hemorrhage, mass or evidence of acute infarction. Remote infarction in the posterior right frontal convexity. There is mild generalized atrophy. There is mild chronic microvascular ischemic change. There is no significant extra-axial fluid collection. No acute intracranial findings are evident. Vascular: No hyperdense vessel or unexpected calcification. Skull: Normal. Negative for fracture or focal lesion. Sinuses/Orbits: No acute finding. IMPRESSION: No acute intracranial findings. There is mild generalized atrophy and chronic appearing white matter hypodensities which likely represent small vessel ischemic disease. Small remote infarction in the posterior right frontal convexity. Electronically Signed   By: Andreas Newport M.D.   On: 10/08/2015 02:35   Dg Hip Unilat W Or Wo Pelvis 2-3 Views Left  Result Date: 10/08/2015 CLINICAL DATA:  [place] fall tonight. EXAM: DG HIP (WITH OR WITHOUT PELVIS) 2-3V LEFT COMPARISON:  None. FINDINGS: There is an acute intertrochanteric left hip  fracture with mild varus angulation. No dislocation. No radiographic findings to suggest a pathologic basis for the fracture. IMPRESSION: Intertrochanteric left hip fracture. Electronically Signed   By: Andreas [place] M.D.   On: 10/08/2015 02:28        Scheduled Meds: . alvimopan  12 mg Oral Once  .  ceFAZolin (ANCEF) IV  2 g Intravenous On Call to OR  . cefoTEtan (CEFOTAN) IV  2 g Intravenous On Call to OR  . Chlorhexidine Gluconate Cloth  6 each Topical Once  . digoxin  0.0625 mg Oral Once per day on Mon Tue Wed Thu Fri  . Influenza vac split quadrivalent PF  0.5 mL Intramuscular Tomorrow-1000  . lidocaine  1 patch Transdermal Q24H  . magnesium gluconate  500 mg Oral Daily  . [START  ON 10/10/2015] metolazone  2.5 mg Oral Once per day on Mon Thu  . potassium chloride  20 mEq Oral Daily  . potassium chloride  10 mEq Intravenous Q1 Hr x 6  . saccharomyces boulardii  250 mg Oral BID  . verapamil  120 mg Oral Daily   Continuous Infusions:     LOS: 1 day    Time spent: 45 minutes.    Birdie Hopes, MD [facility] Pager [phone]  If 7PM-7AM, please contact night-coverage [email or web address] Password [facility] 10/09/2015, 12:25 PM

## 2015-10-09 NOTE — Transfer of Care (Signed)
Immediate Anesthesia Transfer of Care Note  Patient: [name]  Procedure(s) Performed: Procedure(s): INTRAMEDULLARY (IM) NAIL INTERTROCHANTRIC (Left)  Patient Location: PACU  Anesthesia Type:General  Level of Consciousness: awake, alert , oriented and patient cooperative  Airway & Oxygen Therapy: Bipap  Post-op Assessment: Post -op Vital signs reviewed and stable, Post -op Vital signs reviewed and unstable, Anesthesiologist notified and Dr Smith Robert at bedside along with respiratory therapy.   Post vital signs: Reviewed and unstable  Last Vitals:  Vitals:   10/09/15 0932 10/09/15 1821  BP: 125/70 (!) 135/53  Pulse: 71 65  Resp: (!) 23 (!) 24  Temp: 37.2 C     Last Pain:  Vitals:   10/09/15 1821  TempSrc:   PainSc: Asleep         Complications: respiratory complications and Pt not saturating or taking adequate VT. Dr Smith Robert at bedside for extubation in [place]. Decided to give pt trial extubation . Placed on BiPap in PACU.

## 2015-10-09 NOTE — Progress Notes (Signed)
Pt urine red color, change in color from yellow yesterday. Paged Dr. Hartford Poli to make aware. Will continue to monitor.

## 2015-10-09 NOTE — Op Note (Signed)
   Date of Surgery: 10/09/2015  INDICATIONS: [name] is a 82 y.o.-year-old female who sustained a left hip fracture. The risks and benefits of the procedure discussed with the patient prior to the procedure and all questions were answered; consent was obtained.  PREOPERATIVE DIAGNOSIS: left hip fracture   POSTOPERATIVE DIAGNOSIS: Same   PROCEDURE: Treatment of intertrochanteric fracture with intramedullary implant. CPT [phone]   SURGEON: N. Eduard Roux, M.D.   ANESTHESIA: general   IV FLUIDS AND URINE: See anesthesia record   ESTIMATED BLOOD LOSS: 200 cc  IMPLANTS: [employer] InterTAN 10 x 38, 90/85  DRAINS: None.   COMPLICATIONS: None.   DESCRIPTION OF PROCEDURE: The patient was brought to the operating room and placed supine on the operating table. The patient's leg had been signed prior to the procedure. The patient had the anesthesia placed by the anesthesiologist. The prep verification and incision time-outs were performed to confirm that this was the correct patient, site, side and location. The patient had an SCD on the opposite lower extremity. The patient did receive antibiotics prior to the incision and was re-dosed during the procedure as needed at indicated intervals. The patient was positioned on the fracture table with the table in traction and internal rotation to reduce the hip. The well leg was placed in a scissor position and all bony prominences were well-padded. The patient had the lower extremity prepped and draped in the standard surgical fashion. The incision was made 4 finger breadths superior to the greater trochanter. A guide pin was inserted into the tip of the greater trochanter under fluoroscopic guidance. An opening reamer was used to gain access to the femoral canal. The nail length was measured and inserted down the femoral canal to its proper depth. The appropriate version of insertion for the lag screw was found under fluoroscopy. A pin was inserted up  the femoral neck through the jig. Then, a second antirotation pin was inserted inferior to the first pin. The length of the lag screw was then measured. The lag screw was inserted as near to center-center in the head as possible. The antirotation pin was then taken out and an interdigitating compression screw was placed in its place. The leg was taken out of traction, then the interdigitating compression screw was used to compress across the fracture. Compression was visualized on serial xrays. The wound was copiously irrigated with saline and the subcutaneous layer closed with 2.0 vicryl and the skin was reapproximated with staples. The wounds were cleaned and dried a final time and a sterile dressing was placed. The hip was taken through a range of motion at the end of the case under fluoroscopic imaging to visualize the approach-withdraw phenomenon and confirm implant length in the head. The patient was then awakened from anesthesia and taken to the recovery room in stable condition. All counts were correct at the end of the case.   POSTOPERATIVE PLAN: The patient will be weight bearing as tolerated and will return in 2 weeks for staple removal and the patient will receive DVT prophylaxis based on other medications, activity level, and risk ratio of bleeding to thrombosis.   [name] Cecil, MD [place] 5:52 PM

## 2015-10-09 NOTE — Consult Note (Signed)
   [facility] CM Inpatient Consult   10/09/2015  [name] 1923-12-21 [id number]   Patient screened for potential restart of [facility] services.  Patient has had 6 hospitaIzations in the past 6 months. Patient is eligible for [facility] Care Management services under patient's Medicare  plan. Chart review reveals the patient  is a  82 y.o. female who presents with left hip fracture s/p mechanical fall. Her past medical history significant of anemia, A. fib on chronic anticoagulation of Coumadin, chronic diastolic HF,  chronic pericardial effusion, recurrent diverticulitis s/p colostomy.  Patient had adamantly declined ongoing services with [facility] in the past.  Currently following for patient's progress with care needs as she may discharge to a skilled facility at discharge. For questions contact:   Natividad Brood, RN BSN [facility] Liaison  [phone] business mobile phone Toll free office [phone]

## 2015-10-09 NOTE — Progress Notes (Signed)
RT Note: RT was called to PACU to place patient on cpap for sleep apnea.  Patient came out of the OR with a saturation in the 60's and it quickly declined down to 50%. Patient was then placed on Bipap at 100% and gradually came up to between 78-80%. Rt continued to adjust settings to try to improve her oxygenation but was unable to get it to come up any higher than 80%. Md, CRNA, and patients RN was at bedside the entire time. Patient is now on Bipap with Ipap-16, Epap-8, 100%. Patient does not appear to be in any distress despite her low oxygen saturation. MD wanted to continue trying Bipap to avoid intubation. Two Rt's spent the last 45 minutes at the patients bedside trying to get her oxygen saturation up. Night shift RT is now at bedside and will take over with the patient and assist with intubation if necessary. Rt will continue to monitor.

## 2015-10-09 NOTE — Progress Notes (Signed)
Patient arrived to PACU on 10L simple mask with CPAP waiting at bedside. RT at bedside and unable to obtain sats great than 81% on the CPAP. Switched patient to 100% on bipap. Sats ranging from 84%-94& on 100% bipap. Dr Smith Robert at bedside. Pre-op sats were in the 80's on nasal cannula. Goal to keep patient on bipap until sats are maintained and to avoid re-intubation. Patient alert, responding to commands, verbalizing that she is having pain in her hip. VSS -patient paced in the 60's, BP ranging [id number] systolic/ [id number] diastolic. Will keep patient on bipap and transfer to stepdown unit when bed available.Marland Kitchen

## 2015-10-09 NOTE — Anesthesia Postprocedure Evaluation (Signed)
Anesthesia Post Note  Patient: LEYSHA DRILL  Procedure(s) Performed: Procedure(s) (LRB): INTRAMEDULLARY (IM) NAIL INTERTROCHANTRIC (Left)  Patient location during evaluation: PACU Anesthesia Type: General Level of consciousness: awake and alert Pain management: pain level controlled Vital Signs Assessment: post-procedure vital signs reviewed and stable Respiratory status: spontaneous breathing, nonlabored ventilation and respiratory function stable (BiPAP) Cardiovascular status: blood pressure returned to baseline and stable Postop Assessment: no signs of nausea or vomiting Anesthetic complications: no    Last Vitals:  Vitals:   10/09/15 2012 10/09/15 2028  BP: (!) 109/50 (!) 107/49  Pulse: (!) 58 60  Resp: (!) 28 13  Temp:  36.2 C    Last Pain:  Vitals:   10/09/15 2028  TempSrc:   PainSc: Asleep                 Lionardo Haze,W. EDMOND

## 2015-10-09 NOTE — Progress Notes (Signed)
Witnessed all medications administered by Azucena Cecil Student RN with [employer].

## 2015-10-10 ENCOUNTER — Encounter (HOSPITAL_COMMUNITY): Payer: Self-pay | Admitting: Orthopaedic Surgery

## 2015-10-10 ENCOUNTER — Inpatient Hospital Stay (HOSPITAL_COMMUNITY): Payer: Medicare Other

## 2015-10-10 DIAGNOSIS — R0602 Shortness of breath: Secondary | ICD-10-CM

## 2015-10-10 DIAGNOSIS — I480 Paroxysmal atrial fibrillation: Secondary | ICD-10-CM

## 2015-10-10 DIAGNOSIS — J9621 Acute and chronic respiratory failure with hypoxia: Secondary | ICD-10-CM | POA: Diagnosis not present

## 2015-10-10 LAB — CBC
HCT: 28.1 % — ABNORMAL LOW (ref 36.0–46.0)
Hemoglobin: 8.2 g/dL — ABNORMAL LOW (ref 12.0–15.0)
MCH: 21.6 pg — ABNORMAL LOW (ref 26.0–34.0)
MCHC: 29.2 g/dL — ABNORMAL LOW (ref 30.0–36.0)
MCV: 73.9 fL — ABNORMAL LOW (ref 78.0–100.0)
Platelets: 214 10*3/uL (ref 150–400)
RBC: 3.8 MIL/uL — ABNORMAL LOW (ref 3.87–5.11)
RDW: 16.6 % — ABNORMAL HIGH (ref 11.5–15.5)
WBC: 8 10*3/uL (ref 4.0–10.5)

## 2015-10-10 LAB — BASIC METABOLIC PANEL
Anion gap: 9 (ref 5–15)
BUN: 23 mg/dL — ABNORMAL HIGH (ref 6–20)
CO2: 27 mmol/L (ref 22–32)
Calcium: 8.6 mg/dL — ABNORMAL LOW (ref 8.9–10.3)
Chloride: 97 mmol/L — ABNORMAL LOW (ref 101–111)
Creatinine, Ser: 1.68 mg/dL — ABNORMAL HIGH (ref 0.44–1.00)
GFR calc Af Amer: 30 mL/min — ABNORMAL LOW (ref >60)
GFR calc non Af Amer: 26 mL/min — ABNORMAL LOW (ref >60)
Glucose, Bld: 118 mg/dL — ABNORMAL HIGH (ref 65–99)
Potassium: 4.2 mmol/L (ref 3.5–5.1)
Sodium: 133 mmol/L — ABNORMAL LOW (ref 135–145)

## 2015-10-10 MED ORDER — CHLORHEXIDINE GLUCONATE 0.12 % MT SOLN
15.0000 mL | Freq: Two times a day (BID) | OROMUCOSAL | Status: DC
  Administered 2015-10-10 – 2015-10-20 (×17): 15 mL via OROMUCOSAL
  Filled 2015-10-10 (×11): qty 15

## 2015-10-10 MED ORDER — FUROSEMIDE 10 MG/ML IJ SOLN
80.0000 mg | Freq: Two times a day (BID) | INTRAMUSCULAR | Status: DC
  Administered 2015-10-10 – 2015-10-12 (×4): 80 mg via INTRAVENOUS
  Filled 2015-10-10 (×4): qty 8

## 2015-10-10 MED ORDER — WARFARIN - PHARMACIST DOSING INPATIENT: Freq: Every day | Status: DC

## 2015-10-10 MED ORDER — WARFARIN SODIUM 5 MG PO TABS: 5.0000 mg | ORAL_TABLET | Freq: Once | ORAL | Status: DC

## 2015-10-10 MED ORDER — APIXABAN 2.5 MG PO TABS
2.5000 mg | ORAL_TABLET | Freq: Two times a day (BID) | ORAL | Status: DC
  Administered 2015-10-11 – 2015-10-15 (×4): 2.5 mg via ORAL
  Filled 2015-10-10 (×4): qty 1

## 2015-10-10 MED ORDER — MUPIROCIN 2 % EX OINT
TOPICAL_OINTMENT | Freq: Two times a day (BID) | CUTANEOUS | Status: DC
  Administered 2015-10-10 – 2015-10-16 (×9): via NASAL
  Administered 2015-10-17: 1 via NASAL
  Administered 2015-10-17: 11:00:00 via NASAL
  Administered 2015-10-18 (×2): 1 via NASAL
  Administered 2015-10-19: 12:00:00 via NASAL
  Administered 2015-10-19: 1 via NASAL
  Administered 2015-10-20 (×2): via NASAL
  Filled 2015-10-10 (×2): qty 22

## 2015-10-10 MED ORDER — FUROSEMIDE 10 MG/ML IJ SOLN
80.0000 mg | Freq: Once | INTRAMUSCULAR | Status: AC
  Administered 2015-10-10: 80 mg via INTRAVENOUS
  Filled 2015-10-10: qty 8

## 2015-10-10 MED ORDER — ORAL CARE MOUTH RINSE
15.0000 mL | Freq: Two times a day (BID) | OROMUCOSAL | Status: DC
  Administered 2015-10-10 – 2015-10-20 (×16): 15 mL via OROMUCOSAL

## 2015-10-10 NOTE — Progress Notes (Signed)
   Subjective:  Patient reports pain as mild.  Resting comfortably  Objective:   VITALS:   Vitals:   10/09/15 2045 10/09/15 2354 10/10/15 0010 10/10/15 0312  BP:  (!) 100/53  117/63  Pulse:  62 60 72  Resp:  14 18 (!) 21  Temp: 98.4 F (36.9 C) 97.3 F (36.3 C)  97.5 F (36.4 C)  TempSrc: Axillary Axillary  Axillary  SpO2:  99% 100% 100%  Weight:      Height:        Neurovascular intact Sensation intact distally Intact pulses distally Dorsiflexion/Plantar flexion intact Incision: dressing C/D/I and no drainage No cellulitis present Compartment soft   Lab Results  Component Value Date   WBC 8.0 10/10/2015   HGB 8.2 (L) 10/10/2015   HCT 28.1 (L) 10/10/2015   MCV 73.9 (L) 10/10/2015   PLT 214 10/10/2015     Assessment/Plan:  1 Day Post-Op   - Expected postop acute blood loss anemia - will monitor for symptoms - Up with PT/OT - DVT ppx - SCDs, ambulation, lovenox bridge to coumadin - WBAT operative extremity  Marianna Payment 10/10/2015, 7:50 AM [phone]

## 2015-10-10 NOTE — Care Management Note (Signed)
Case Management Note  Patient Details  Name: [name] MRN: [id number] Date of Birth: 09-Feb-1923  Subjective/Objective:    Patient with left hip fx, per pt recs SNF, CSW referral.                Action/Plan:   Expected Discharge Date:                  Expected Discharge Plan:  [place] (Unknown)  In-House Referral:  Clinical Social Work  Discharge planning Services  CM Consult  Post Acute Care Choice:  NA Choice offered to:  NA  DME Arranged:    DME Agency:     HH Arranged:    [place] Agency:     Status of Service:  In process, will continue to follow  If discussed at Long Length of Stay Meetings, dates discussed:    Additional Comments:  Zenon Mayo, RN 10/10/2015, 4:25 PM

## 2015-10-10 NOTE — Progress Notes (Signed)
SUBJECTIVE:  No complaints - sleeping  OBJECTIVE:   Vitals:   Vitals:   10/10/15 0010 10/10/15 0312 10/10/15 0731 10/10/15 1133  BP:  117/63 (!) 117/58 (!) 101/58  Pulse: 60 72 (!) 55 60  Resp: 18 (!) 21 (!) 21 14  Temp:  97.5 F (36.4 C) (!) 96.5 F (35.8 C) (!) 96.8 F (36 C)  TempSrc:  Axillary Axillary Axillary  SpO2: 100% 100% 92% 92%  Weight:      Height:       I&O's:   Intake/Output Summary (Last 24 hours) at 10/10/15 1141 Last data filed at 10/10/15 0805  Gross per 24 hour  Intake          2471.25 ml  Output              450 ml  Net          2021.25 ml   TELEMETRY: Reviewed telemetry pt in atrial fibrillation with V paced rhythm at 60bpm     PHYSICAL EXAM General: Well developed, well nourished, in no acute distress Head: Eyes PERRLA, No xanthomas.   Normal cephalic and atramatic  Lungs:   Clear bilaterally to auscultation anteriorly Heart:   HRRR S1 S2 Pulses are 2+ & equal. Abdomen: Bowel sounds are positive, abdomen soft and non-tender without masses Msk:  Back normal, normal gait. Normal strength and tone for age. Extremities:   No clubbing, cyanosis or edema.  DP +1 Neuro: Alert and oriented X 3. Psych:  Good affect, responds appropriately   LABS: Basic Metabolic Panel:  Recent Labs  10/08/15 0334  10/08/15 1956 10/09/15 0754 10/10/15 0517  NA  --   --  136 135 133*  K 2.2*  --  2.6* 3.5 4.2  CL  --   --  93* 95* 97*  CO2  --   < > 33* 32 27  GLUCOSE  --   --  160* 110* 118*  BUN  --   --  18 18 23*  CREATININE  --   --  1.39* 1.35* 1.68*  CALCIUM  --   < > 8.9 8.9 8.6*  MG 1.7  --  1.9  --   --   < > = values in this interval not displayed. Liver Function Tests: No results for input(s): AST, ALT, ALKPHOS, BILITOT, PROT, ALBUMIN in the last 72 hours. No results for input(s): LIPASE, AMYLASE in the last 72 hours. CBC:  Recent Labs  10/08/15 0230  10/09/15 0754 10/10/15 0517  WBC 5.7  --  7.9 8.0  NEUTROABS 4.0  --   --   --     HGB 9.4*  < > 8.3* 8.2*  HCT 30.6*  < > 29.0* 28.1*  MCV 70.3*  --  72.9* 73.9*  PLT 253  --  225 214  < > = values in this interval not displayed. Cardiac Enzymes: No results for input(s): CKTOTAL, CKMB, CKMBINDEX, TROPONINI in the last 72 hours. BNP: Invalid input(s): POCBNP D-Dimer: No results for input(s): DDIMER in the last 72 hours. Hemoglobin A1C: No results for input(s): HGBA1C in the last 72 hours. Fasting Lipid Panel: No results for input(s): CHOL, HDL, LDLCALC, TRIG, CHOLHDL, LDLDIRECT in the last 72 hours. Thyroid Function Tests: No results for input(s): TSH, T4TOTAL, T3FREE, THYROIDAB in the last 72 hours.  Invalid input(s): FREET3 Anemia Panel: No results for input(s): VITAMINB12, FOLATE, FERRITIN, TIBC, IRON, RETICCTPCT in the last 72 hours. Coag Panel:   Lab Results  Component Value Date   INR 1.51 10/09/2015   INR 2.90 10/08/2015   INR 2.01 09/30/2015    RADIOLOGY: Dg Chest 2 View  Result Date: 10/08/2015 CLINICAL DATA:  Fall tonight, on anticoagulation. EXAM: CHEST  2 VIEW COMPARISON:  Chest radiograph 09/28/2015, included lung bases from CT abdomen/pelvis 09/24/2015. FINDINGS: Multi lead right-sided pacemaker. Larger into the cardiac silhouette. Abnormal 1 density abutting the right heart border likely secondary to large pericardial effusion as seen on recent CT. Small bilateral pleural effusions. There is mild vascular congestion. No pneumothorax or new parenchymal opacity. No acute osseous abnormalities are seen, thoracic spine obscured on the lateral view. IMPRESSION: 1. Development of vascular congestion. Stable enlarged cardiac silhouette, pericardial effusion noted on recent prior abdominal CTs. 2. Small bilateral pleural effusions. Electronically Signed   By: Jeb Levering M.D.   On: 10/08/2015 02:32   Dg Chest 2 View  Result Date: 09/28/2015 CLINICAL DATA:  Shortness of breath. EXAM: CHEST  2 VIEW COMPARISON:  06/06/2015 FINDINGS: The pacer wires are  stable. Stable cardiac enlargement and tortuous and calcified thoracic aorta. Chronic underlying lung disease with superimposed pleural effusions and bibasilar infiltrates. The bony thorax is intact. IMPRESSION: Stable cardiac enlargement. Bilateral pleural effusions and bibasilar infiltrates. Electronically Signed   By: Marijo Sanes M.D.   On: 09/28/2015 08:27   Dg Forearm Left  Result Date: 10/08/2015 CLINICAL DATA:  [place] tonight EXAM: LEFT FOREARM - 2 VIEW COMPARISON:  None. FINDINGS: There is no evidence of fracture or other focal bone lesions. Soft tissues are unremarkable. IMPRESSION: Negative. Electronically Signed   By: Andreas Newport M.D.   On: 10/08/2015 02:28   Dg Wrist Complete Right  Result Date: 10/08/2015 CLINICAL DATA:  [place] tonight EXAM: RIGHT WRIST - COMPLETE 3+ VIEW COMPARISON:  None. FINDINGS: There is no evidence of fracture or dislocation. There is no evidence of arthropathy or other focal bone abnormality. Soft tissues are unremarkable. IMPRESSION: Negative. Electronically Signed   By: Andreas Newport M.D.   On: 10/08/2015 02:29   Dg Abd 1 View  Result Date: 09/25/2015 CLINICAL DATA:  Bowel obstruction due to peristomal herniation of small bowel. EXAM: ABDOMEN - 1 VIEW COMPARISON:  CT scans dated 09/24/2015 and radiographs dated 06/11/2015 FINDINGS: The small bowel distention visible on the prior CT scan has resolved. The bowel gas pattern is normal. Colostomy in the left lower quadrant is noted. Aortic atherosclerosis.  Cholecystectomy. IMPRESSION: Resolution of small bowel obstruction. Aortic atherosclerosis. Electronically Signed   By: Lorriane Shire M.D.   On: 09/25/2015 09:39   Ct Head Wo Contrast  Result Date: 10/08/2015 CLINICAL DATA:  [place] today. EXAM: CT HEAD WITHOUT CONTRAST TECHNIQUE: Contiguous axial images were obtained from the base of the skull through the vertex without intravenous contrast. COMPARISON:  05/18/2011 FINDINGS: Brain: There is no intracranial  hemorrhage, mass or evidence of acute infarction. Remote infarction in the posterior right frontal convexity. There is mild generalized atrophy. There is mild chronic microvascular ischemic change. There is no significant extra-axial fluid collection. No acute intracranial findings are evident. Vascular: No hyperdense vessel or unexpected calcification. Skull: Normal. Negative for fracture or focal lesion. Sinuses/Orbits: No acute finding. IMPRESSION: No acute intracranial findings. There is mild generalized atrophy and chronic appearing white matter hypodensities which likely represent small vessel ischemic disease. Small remote infarction in the posterior right frontal convexity. Electronically Signed   By: Andreas Newport M.D.   On: 10/08/2015 02:35   Ct Cervical Spine Wo Contrast  Result  Date: 09/27/2015 CLINICAL DATA:  82 year old female with recent bowel surgery due to diverticulitis. Cervicothoracic pain radiating to the left shoulder. No known injury. Initial encounter. EXAM: CT CERVICAL SPINE WITHOUT CONTRAST TECHNIQUE: Multidetector CT imaging of the cervical spine was performed without intravenous contrast. Multiplanar CT image reconstructions were also generated. COMPARISON:  Neck radiographs 10/17/2011. FINDINGS: Study is intermittently, up to moderately degraded by motion artifact despite repeated imaging attempts. Osteopenia. Visualized skull base is intact. No atlanto-occipital dissociation. Straightening and mild reversal of cervical lordosis. Cervicothoracic junction alignment is within normal limits. Bilateral posterior element alignment is within normal limits. No cervical spine fracture identified. Negative visualized posterior fossa. Calcified atherosclerosis at the skull base. Visible tympanic cavities and mastoids are clear. Mild for age calcified carotid atherosclerosis in the neck. Partially retropharyngeal course of the carotids. Otherwise negative noncontrast neck soft tissues.  Negative lung apices. Chronic cervical spine disc and posterior element degeneration. Mild degenerative appearing anterolisthesis at C3-C4 and C4-C5. Still, there is only mild if any cervical spinal stenosis suspected, primarily at C5-C6. IMPRESSION: 1. Osteopenia. Motion degraded exam despite repeated imaging attempts. No acute osseous abnormality identified in the cervical spine. 2. Cervical spine degeneration, but only mild if any suspected cervical spinal stenosis. Electronically Signed   By: Genevie Ann M.D.   On: 09/27/2015 15:01   Ct Thoracic Spine Wo Contrast  Result Date: 09/27/2015 CLINICAL DATA:  82 year old female with recent bowel surgery due to diverticulitis. Cervicothoracic pain radiating to the left shoulder. No known injury. Initial encounter. EXAM: CT THORACIC SPINE WITHOUT CONTRAST TECHNIQUE: Multidetector CT imaging of the thoracic spine was performed without intravenous contrast administration. Multiplanar CT image reconstructions were also generated. COMPARISON:  Cervical spine CT from today reported separately. Thoracic spine radiographs 08/28/2015. Chest CT 07/04/2011. FINDINGS: Osteopenia. Normal thoracic segmentation. Stable mild exaggeration of thoracic kyphosis since 2013. Thoracic vertebrae appear intact. Posterior ribs appear intact. Visible upper lumbar levels appear intact. No CT evidence of thoracic spinal stenosis. Mild for age thoracic spine degeneration. Calcified aortic atherosclerosis. Cardiomegaly. Pericardial effusion not well visualized today. Small to moderate right and small left layering pleural effusions. Negative visualized noncontrast upper abdominal viscera. Patchy and confluent left lower lobe pulmonary opacity, appears peribronchial and inflammatory in nature. IMPRESSION: 1. Osteopenia. No acute osseous abnormality identified. In the thoracic spine. Mild for age thoracic spine degeneration. 2. Confluent left lower lobe pulmonary opacity could reflect aspiration or  pneumonia in this setting. 3. Moderate layering right and small layering left pleural effusions. 4.  Calcified aortic atherosclerosis. 5. Cardiomegaly. Large pericardial effusion better demonstrated on CT Abdomen and Pelvis 09/24/2015 (please see that report). Electronically Signed   By: Genevie Ann M.D.   On: 09/27/2015 15:07   Dg Chest Port 1v Same Day  Result Date: 10/10/2015 CLINICAL DATA:  Shortness of breath, history of chronic CHF, pericardial effusion, previous CVA, recent ORIF for left hip fracture. EXAM: PORTABLE CHEST 1 VIEW COMPARISON:  Chest x-ray of October 08, 2015 FINDINGS: The cardiac silhouette remains enlarged. The pulmonary vascularity remains engorged. The left hemidiaphragm is now obscured and there is partial obscuration of the right hemidiaphragm. The permanent pacemaker is in stable position. There is calcification in the wall of the aortic arch. There is no pneumothorax. The bony thorax exhibits no acute abnormality. IMPRESSION: CHF with pulmonary vascular congestion and interstitial edema more conspicuous today. Probable small bilateral pleural effusions. Aortic atherosclerosis. Electronically Signed   By: David  Martinique M.D.   On: 10/10/2015 07:45   Dg C-arm  1-60 Min  Result Date: 10/09/2015 CLINICAL DATA:  Left femur IM nail for intertrochanteric femur fracture. EXAM: DG C-ARM 61-120 MIN; OPERATIVE LEFT HIP WITH PELVIS COMPARISON:  10/08/2015 FINDINGS: Two intraoperative spot fluoro films are submitted. The patient is status post ORIF for intertrochanteric left hip fracture. Two proximal cannulated screws are evident with a long intra great intra medullary femoral nail, incompletely visualized. Bony alignment is improved compared to the pre reduction study. IMPRESSION: Status post ORIF for intertrochanteric left femur fracture. No evidence for immediate hardware complications within the visualized portions of the femur. Electronically Signed   By: Misty Stanley M.D.   On: 10/09/2015  17:57   Ct Renal Stone Study  Result Date: 09/24/2015 CLINICAL DATA:  Acute on chronic back pain. Left flank pain. Leukocytosis. EXAM: CT ABDOMEN AND PELVIS WITHOUT CONTRAST TECHNIQUE: Multidetector CT imaging of the abdomen and pelvis was performed following the standard protocol without IV contrast. COMPARISON:  09/23/2015 FINDINGS: Lower chest: Large and possibly loculated pericardial effusion, similar to prior. Extensive coronary atherosclerosis. Calcified mitral valve. Pacer leads. Moderate cardiomegaly. Mild atelectasis in the right middle lobe and lingula. Mild atelectasis in both lower lobes. Hepatobiliary: Cholecystectomy Pancreas: Unremarkable Spleen: Unremarkable Adrenals/Urinary Tract: Abnormal appearance the kidneys with cortical contrast remaining on board, but some clearing of the medullary contrast. As far as I am aware the patient received contrast 33 hours prior to today's scan, the persistent contrast in the cortex is indicative of renal dysfunction. There is some excreted contrast in the collecting systems in urinary bladder is well. Scarring in the kidneys, right greater than left. Bilateral renal cysts. Stomach/Bowel: Small bowel obstruction due to a peristomal hernia of small bowel, about the junction of the jejunum and ileum, extending above the descending colostomy site. Small bowel loops up to 4.1 cm. Distended stomach. Appendix normal. Rectal stump unremarkable. Vascular/Lymphatic: Aortoiliac atherosclerotic vascular disease. Scattered small retroperitoneal lymph nodes. Reproductive: Unremarkable Other: Diffuse subcutaneous and mesenteric edema, query generalized third spacing of fluid. Edema is most notable along the lower anterior pelvic wall and pubis region. Musculoskeletal: Lumbar spondylosis and degenerative disc disease. IMPRESSION: 1. Small bowel obstruction due to a peristomal herniation of small bowel just above the descending colostomy. Small bowel dilated up to 4.1 cm in  the stomach is distended. 2. Abnormal appearance the kidneys, with retained contrast medium in the renal cortex bilaterally, cannot exclude acute renal failure or contrast nephropathy causing this appearance. The patient last received IV contrast about 33 hours prior to today's scan, as best I know. 3. Stable large and possibly loculated pericardial effusion with extensive coronary atherosclerosis, calcified mitral valve, and moderate cardiomegaly. 4.  Aortoiliac atherosclerotic vascular disease. 5. Third spacing of fluid with generalized mesenteric and subcutaneous edema. 6. I do not see any renal stones but sensitivity for nonobstructive stones is low due to the presence of contrast medium within the urinary tract left over from yesterday. Electronically Signed   By: Van Clines M.D.   On: 09/24/2015 20:14   Dg Hip Operative Unilat W Or W/o Pelvis Left  Result Date: 10/09/2015 CLINICAL DATA:  Left femur IM nail for intertrochanteric femur fracture. EXAM: DG C-ARM 61-120 MIN; OPERATIVE LEFT HIP WITH PELVIS COMPARISON:  10/08/2015 FINDINGS: Two intraoperative spot fluoro films are submitted. The patient is status post ORIF for intertrochanteric left hip fracture. Two proximal cannulated screws are evident with a long intra great intra medullary femoral nail, incompletely visualized. Bony alignment is improved compared to the pre reduction study. IMPRESSION: Status  post ORIF for intertrochanteric left femur fracture. No evidence for immediate hardware complications within the visualized portions of the femur. Electronically Signed   By: Misty Stanley M.D.   On: 10/09/2015 17:57   Dg Hip Unilat W Or Wo Pelvis 2-3 Views Left  Result Date: 10/08/2015 CLINICAL DATA:  [place]ound level fall tonight. EXAM: DG HIP (WITH OR WITHOUT PELVIS) 2-3V LEFT COMPARISON:  None. FINDINGS: There is an acute intertrochanteric left hip fracture with mild varus angulation. No dislocation. No radiographic findings to suggest a  pathologic basis for the fracture. IMPRESSION: Intertrochanteric left hip fracture. Electronically Signed   By: Andreas Newport M.D.   On: 10/08/2015 02:28    IMPRESSION: 1. Hip fracture - s/p repair 2. AFib, permanent:Rate controlled on verapamil and dig.  Dig level < 1.   It will likely be impossible to quickly resume warfarin anticoagulation since she has received large doses of vitamin K. I think she is best suited for postoperative DVT and stroke prophylaxis with a direct oral anticoagulant such as Eliquis. This would also allow more versatility for management of anticoagulation around the time of future possible surgery for colostomy takedown, which she was planning. When ok with surgery would start Eliquis 2.5mg  BID.  Full anticoagulant effect will be present within 24 hours of first dose. 3. Diastolic heart failure:she appears euvolemic on exam.   4. Pericardial effusion: This is located exclusively posterior to the heart and is relatively small in overall volume. She does not appear to be of any hemodynamic consequence at this time. 5. AS: Mild and unlikely to be clinically relevant in the near future. 6. TR: She has severe tricuspid regurgitation and at least moderate pulmonary artery hypertension. Most likely this is related to chronic diastolic heart failure, but may make her sensitive to aggressive volume shifts (for example hypotension with rapid  Diuresis). 7. Severe hypokalemia: repleted 8. Pacemaker: Normal device function and recent check. Hip surgery is unlikely to interfere with pacemaker function. She is not pacemaker dependent.   Fransico Him, MD  10/10/2015  11:41 AM

## 2015-10-10 NOTE — Progress Notes (Signed)
Pt transferred to 3s12 from PACU due to Bipap need.  Pt is A&Ox4 but restless and continuously taking off bipap.  NP notified of events and needs of patient.  Orders given.  Family updated on pt status.  Will continue to monitor.

## 2015-10-10 NOTE — Progress Notes (Signed)
Pt output is low even it lasix and foley in place. Bladder scan showed 18 mls. Unable to wean oxygen levels at this time. Little reserve of o2 levels when NRB comes off.  Notified Dr. Hartford Poli. Bipap order to be placed. Notified RT to come by.  Daughter at bedside, will continue to monitor for changes.

## 2015-10-10 NOTE — Procedures (Signed)
BiPAP removed from pt by RN and placed on [place].  Pt is resting comfortably at this time.

## 2015-10-10 NOTE — Evaluation (Signed)
Physical Therapy Evaluation Patient Details Name: [name] MRN: [id number] DOB: March 07, 1923 Today's Date: 10/10/2015   History of Present Illness  y.o. female with medical history significant of anemia, A. fib on chronic anticoagulation of Coumadin, chronic diastolic CHF (last echo in 01/2015 with EF of 55-60%), chronic pericardial effusion, recurrent diverticulitis s/p colostomy; who presented to the ED after having a fall. She sustained a L hip fx and underwent IM nailing 10/09/15.  Clinical Impression  Pt admitted with above diagnosis. Pt currently with functional limitations due to the deficits listed below (see PT Problem List). On eval, pt required max to total assist with bed mobility. Pt unable to progress to EOB sitting due to desaturation and tachycardia. Pt will benefit from skilled PT to increase their independence and safety with mobility to allow discharge to the venue listed below.       Follow Up Recommendations SNF;Supervision/Assistance - 24 hour    Equipment Recommendations  None recommended by PT    Recommendations for Other Services       Precautions / Restrictions Precautions Precautions: Fall Restrictions Weight Bearing Restrictions: Yes LLE Weight Bearing: Weight bearing as tolerated      Mobility  Bed Mobility Overal bed mobility: Needs Assistance Bed Mobility: Rolling;Supine to Sit;Sit to Supine Rolling: Max assist   Supine to sit: Total assist Sit to supine: Total assist   General bed mobility comments: multimodal cues for sequencing, increased time to complete with pt participation. O2 sats remaining in the 80s throughout eval on O2 via [place]. Attempted supine to sit but pt unable to tolerate requiring return to supine. HR increased to 127.  Transfers                 General transfer comment: Unable to progress beyond bed mobility.  Ambulation/Gait                Stairs            Wheelchair Mobility    Modified Rankin  (Stroke Patients Only)       Balance                                             Pertinent Vitals/Pain Pain Assessment: Faces Faces Pain Scale: Hurts even more Pain Location: LLE with mobility Pain Descriptors / Indicators: Grimacing;Guarding Pain Intervention(s): Limited activity within patient's tolerance;Monitored during session    Home Living Family/patient expects to be discharged to:: Private residence Living Arrangements: Other relatives Available Help at Discharge: Family;Friend(s);Personal care attendant;Available 24 hours/day Type of Home: House Home Access: Ramped entrance     Home Layout: One level Home Equipment: Walker - 2 wheels Additional Comments: patient stays with sister who is paralyzed, has caregivers 7 hours/5 days a week. Other family lives close by and often stay.    Prior Function Level of Independence: Needs assistance   Gait / Transfers Assistance Needed: mod I with RW  ADL's / Homemaking Assistance Needed: assist with bath        Hand Dominance   Dominant Hand: Right    Extremity/Trunk Assessment   Upper Extremity Assessment: Generalized weakness           Lower Extremity Assessment: Generalized weakness;LLE deficits/detail      Cervical / Trunk Assessment: Kyphotic  Communication   Communication: HOH  Cognition Arousal/Alertness: Lethargic;Suspect due to medications Behavior During Therapy: Flat  affect Overall Cognitive Status: Difficult to assess                      General Comments      Exercises        Assessment/Plan    PT Assessment Patient needs continued PT services  PT Diagnosis Generalized weakness;Acute pain;Difficulty walking   PT Problem List Decreased strength;Decreased range of motion;Decreased activity tolerance;Decreased balance;Decreased mobility;Cardiopulmonary status limiting activity;Pain;Decreased cognition  PT Treatment Interventions DME instruction;Gait  training;Functional mobility training;Therapeutic activities;Therapeutic exercise;Balance training;Patient/family education   PT Goals (Current goals can be found in the Care Plan section) Acute Rehab PT Goals Patient Stated Goal: not stated PT Goal Formulation: With family Time For Goal Achievement: 10/24/15 Potential to Achieve Goals: Fair    Frequency Min 3X/week   Barriers to discharge        Co-evaluation               End of Session   Activity Tolerance: Treatment limited secondary to medical complications (Comment) (desat, tachy) Patient left: in bed;with family/visitor present;with call bell/phone within reach;with SCD's reapplied Nurse Communication: Mobility status         Time: 1037-1050 PT Time Calculation (min) (ACUTE ONLY): 13 min   Charges:   PT Evaluation $PT Eval Moderate Complexity: 1 Procedure     PT G CodesLorriane Shire 10/10/2015, 12:06 PM

## 2015-10-10 NOTE — Progress Notes (Addendum)
ANTICOAGULATION CONSULT NOTE - Initial Consult  Pharmacy Consult for warfarin Indication: atrial fibrillation and recent hip surgery   Assessment: 82 year old female with a PMH of chronic A. fib on Coumadin, chronic diastolic CHF, 2-D echo 123XX123 with LVEF 55-60 percent and HTN.  INR on admission was 2.9 on 9/12, s/p hip repair on 9/13. INR not done this am but was 1.5 yesterday after 2 units and 20mg  total of vitamin k. SCDs and sq lovenox (40mg ) was ordered post-op, orders to add back warfarin tonight. Hgb stable in 8s overnight.  PTA warfarin dose: 2.5mg  daily except 5mg  on Monday and Thursday from last visit on 09/25/15  Goal of Therapy:  INR 2-3 Monitor platelets by anticoagulation protocol: Yes   Plan:  Warfarin 5mg  tonight - likely to be resistant given vitamin k usage earlier this week Daily INR Continue lovenox until INR closer to 2  Addendum: Transitioning to Eliquis due to amount of vitamin given prior to procedure and length for warfarin to become therapeutic again. Will initiate Eliquis at reduced dose of 2.5mg  bid due to patient's aged and reduced renal function, starting tomorrow morning per consult. Will education patient/family on Eliquis when able. Pharmacy will sign off, but continue to monitor CBC and s/sx's of infection.    Allergies  Allergen Reactions  . Anti-Inflammatory Enzyme [Nutritional Supplements] Other (See Comments)    Retains fluids and headaches  . Iodinated Diagnostic Agents Hives    HIVES 15MIN S/P IV CONTRAST INJECTION,WILL NEED 13 HR PREP FOR FUTURE INJECTIONS, ok s/p 50mg  po benadryl//a.calhoun  . Spironolactone Other (See Comments)    Hair loss  . Arthrotec [Diclofenac-Misoprostol] Other (See Comments)    unknown  . Biaxin [Clarithromycin] Other (See Comments)    unknown  . Plavix [Clopidogrel Bisulfate] Other (See Comments)    unknown    Patient Measurements: Height: 5\' 4"  (162.6 cm) Weight: 158 lb (71.7 kg) (bedscale) IBW/kg  (Calculated) : 54.7  Vital Signs: Temp: 96.5 F (35.8 C) (09/14 0731) Temp Source: Axillary (09/14 0731) BP: 117/58 (09/14 0731) Pulse Rate: 55 (09/14 0731)  Labs:  Recent Labs  10/08/15 0230  10/08/15 0241 10/08/15 1956 10/09/15 0754 10/10/15 0517  HGB 9.4*  --  10.5*  --  8.3* 8.2*  HCT 30.6*  --  31.0*  --  29.0* 28.1*  PLT 253  --   --   --  225 214  LABPROT 31.0*  --   --   --  18.3*  --   INR 2.90  --   --   --  1.51  --   CREATININE  --   < > 1.50* 1.39* 1.35* 1.68*  < > = values in this interval not displayed.  Estimated Creatinine Clearance: 21.6 mL/min (by C-G formula based on SCr of 1.68 mg/dL (H)).   Medical History: Past Medical History:  Diagnosis Date  . Anemia   . Arthritis   . Chronic atrial fibrillation (HCC)    a. on Coumadin, Digoxin, and Verapamil  . Chronic diastolic (congestive) heart failure ([place])    a. Echo 01/2015 with EF of 55-60%.  . Hip fracture, left ([place]) 09/2015   CLOSED  . History of GI bleed    a. secondary to AVM's. Treated with Fe infusion  . History of shingles   . HTN (hypertension)   . LVH (left ventricular hypertrophy)   . Obesity   . Pericardial effusion    a. s/p pericardial window 01/16/15  . Positive TB test   .  Sleep apnea    mild-no cpap  . Stroke [facility])    a. 2013: right frontal  . Tachycardia-bradycardia syndrome ([place])    a. s/p Med[employer]onic [place], model number [id number], serial number [id number] 12/2013     Erin Hearing [name]., BCPS Clinical Pharmacist Pager [phone] 10/10/2015 10:34 AM

## 2015-10-10 NOTE — Progress Notes (Signed)
PROGRESS NOTE  [name]  [id number] DOB: Jan 24, 1924  DOA: 10/08/2015 PCP: [name], MD  Primary Cardiologist: Dr. Sanda Klein  Subjective: Seen with her 2 daughters at bedside, they were trying to clean her up as her colostomy bag was leaking.  Brief Narrative:  82 year old female with a PMH of chronic A. fib on Coumadin, chronic diastolic CHF, 2-D echo 123XX123 with LVEF 55-60 percent, HTN, pericardial effusion status post pericardial window 01/16/15, stroke, tachybradycardia syndrome status post pacemaker, anemia, status post recent hospitalization 09/24/15-09/30/15 for urgent sigmoid colectomy with Hartmann's procedure and small bowel resection on 05/31/15 following which transferred to inpatient rehabilitation and then transitioned home, awaiting colostomy reversal/repair of parastomal hernia, presented to ED on 10/08/15 following a mechanical fall at home and sustained left hip pain, right wrist and left forearm pain. Workup revealed left intertrochanteric hip fracture, potassium 2.2. Orthopedics/Dr. Erlinda Hong was consulted and plan for surgery on 10/09/15 after medical optimization.   Assessment & Plan:   Principal Problem:   Closed left hip fracture [facility]) Active Problems:   Pacemaker   Paroxysmal atrial fibrillation (HCC)   Hypokalemia   Anemia   Fall   Acute and chronic respiratory failure with hypoxia (HCC)   Left intertrochanteric hip fracture -Left intratrochanteric hip fracture after mechanical fall, orthopedics consulted status post repair on 10/09/2015 -Coumadin held, INR was 1.5 for the time of surgery. -Cardiology was consulted prior to surgery because of fragility of the patient and previous cardiovascular events. -Post surgery developed mild fluid overload, treated with aggressive diuresis.  Acute respiratory failure with hypoxia -After the surgery patient spent the night in the step down and required BiPAP and nonrebreather mask transiently -Currently on 2 L of  oxygen, repeat chest x-ray showed more fluids. -Patient is on oral Lasix, the Lasix given earlier today  Acute on chronic diastolic CHF -She appeared euvolemic on admission, he developed acute respiratory failure with hypoxia, CXR showed more congestion. -Is on Lasix, this is switched to IV Lasix, follow intake and output.  Chronic atrial fibrillation/tachybradycardia syndrome status post pacemaker - CHADS2Vasc 7. INR therapeutic at 2.9 on admission. - Rate is controlled with verapamil and digoxin. Patient was on warfarin for anticoagulation. -Cardiology recommended Eliquis 2.5 mg for anticoagulation when it's okay with general surgery.  Acute on stage III chronic kidney disease - Creatinine at recent discharge 09/30/15:1.2. Now presented with creatinine of 1.5.  - Briefly hydrated with IV fluids. Chest x-ray suggests pulmonary congestion. DC IV fluids.  Hypokalemia - Potassium 2.2 on admission. Mg: 1.7. Replacing aggressively. Follow BMP and magnesium. - Potassium improved to 3.5 with multiple rounds of oral and IV potassium. Repeat oral potassium and check BMP in a.m.  Essential hypertension - Controlled.  Anemia - Stable. Follow CBCs postoperatively.   DVT prophylaxis: SCD's Code Status: Full Family Communication: None at bedside. Discussed with patient. Disposition Plan: Likely SNF when medically ready.   Consultants:   Orthopedics  Procedures:   Foley  Antimicrobials:   None     Objective:  Vitals:   10/10/15 0010 10/10/15 0312 10/10/15 0731 10/10/15 1133  BP:  117/63 (!) 117/58 (!) 101/58  Pulse: 60 72 (!) 55 60  Resp: 18 (!) 21 (!) 21 14  Temp:  97.5 F (36.4 C) (!) 96.5 F (35.8 C) (!) 96.8 F (36 C)  TempSrc:  Axillary Axillary Axillary  SpO2: 100% 100% 92% 92%  Weight:      Height:        Intake/Output Summary (Last  24 hours) at 10/10/15 1149 Last data filed at 10/10/15 0805  Gross per 24 hour  Intake          2471.25 ml  Output               450 ml  Net          2021.25 ml   Filed Weights   10/08/15 0600 10/09/15 0629  Weight: 70.4 kg (155 lb 1.6 oz) 71.7 kg (158 lb)    Examination:  General exam: Pleasant elderly frail female lying comfortably supine in bed.  Respiratory system: Clear to auscultation. Respiratory effort normal. Cardiovascular system: S1 & S2 heard, Irregularly irregular. No JVD, murmurs, rubs, gallops or clicks. No pedal edema. V paced.  Gastrointestinal system: Abdomen is nondistended, soft and nontender. No organomegaly or masses felt. Normal bowel sounds heard. Central nervous system: Alert and oriented. No focal neurological deficits. Extremities: Symmetric 5 x 5 power except left lower extremity where movements are restricted due to pain.. Skin: No rashes, lesions or ulcers Psychiatry: Judgement and insight appear normal. Mood & affect appropriate.     Data Reviewed: I have personally reviewed following labs and imaging studies  CBC:  Recent Labs Lab 10/08/15 0230 10/08/15 0241 10/09/15 0754 10/10/15 0517  WBC 5.7  --  7.9 8.0  NEUTROABS 4.0  --   --   --   HGB 9.4* 10.5* 8.3* 8.2*  HCT 30.6* 31.0* 29.0* 28.1*  MCV 70.3*  --  72.9* 73.9*  PLT 253  --  225 [id number]   Basic Metabolic Panel:  Recent Labs Lab 10/08/15 0241 10/08/15 0334 10/08/15 1956 10/09/15 0754 10/10/15 0517  NA 139  --  136 135 133*  K 2.3* 2.2* 2.6* 3.5 4.2  CL 91*  --  93* 95* 97*  CO2  --   --  33* 32 27  GLUCOSE 123*  --  160* 110* 118*  BUN 21*  --  18 18 23*  CREATININE 1.50*  --  1.39* 1.35* 1.68*  CALCIUM  --   --  8.9 8.9 8.6*  MG  --  1.7 1.9  --   --    GFR: Estimated Creatinine Clearance: 21.6 mL/min (by C-G formula based on SCr of 1.68 mg/dL (H)). Liver Function Tests: No results for input(s): AST, ALT, ALKPHOS, BILITOT, PROT, ALBUMIN in the last 168 hours. No results for input(s): LIPASE, AMYLASE in the last 168 hours. No results for input(s): AMMONIA in the last 168 hours. Coagulation  Profile:  Recent Labs Lab 10/08/15 0230 10/09/15 0754  INR 2.90 1.51   Cardiac Enzymes: No results for input(s): CKTOTAL, CKMB, CKMBINDEX, TROPONINI in the last 168 hours. BNP (last 3 results) No results for input(s): PROBNP in the last 8760 hours. HbA1C: No results for input(s): HGBA1C in the last 72 hours. CBG: No results for input(s): GLUCAP in the last 168 hours. Lipid Profile: No results for input(s): CHOL, HDL, LDLCALC, TRIG, CHOLHDL, LDLDIRECT in the last 72 hours. Thyroid Function Tests: No results for input(s): TSH, T4TOTAL, FREET4, T3FREE, THYROIDAB in the last 72 hours. Anemia Panel: No results for input(s): VITAMINB12, FOLATE, FERRITIN, TIBC, IRON, RETICCTPCT in the last 72 hours.  Sepsis Labs: No results for input(s): PROCALCITON, LATICACIDVEN in the last 168 hours.  Recent Results (from the past 240 hour(s))  MRSA PCR Screening     Status: Abnormal   Collection Time: 10/08/15  3:59 PM  Result Value Ref Range Status   MRSA by PCR POSITIVE (A)  NEGATIVE Final    Comment:        The GeneXpert MRSA Assay (FDA approved for NASAL specimens only), is one component of a comprehensive MRSA colonization surveillance program. It is not intended to diagnose MRSA infection nor to guide or monitor treatment for MRSA infections. CRITICAL RESULT CALLED TO, READ BACK BY AND VERIFIED WITH: Stasia Cavalier, RN AT [facility] ON 10/08/15 BY C. Cooks, MLT.          Radiology Studies: Dg Chest Port 1v Same Day  Result Date: 10/10/2015 CLINICAL DATA:  Shortness of breath, history of chronic CHF, pericardial effusion, previous CVA, recent ORIF for left hip fracture. EXAM: PORTABLE CHEST 1 VIEW COMPARISON:  Chest x-ray of October 08, 2015 FINDINGS: The cardiac silhouette remains enlarged. The pulmonary vascularity remains engorged. The left hemidiaphragm is now obscured and there is partial obscuration of the right hemidiaphragm. The permanent pacemaker is in stable position. There  is calcification in the wall of the aortic arch. There is no pneumothorax. The bony thorax exhibits no acute abnormality. IMPRESSION: CHF with pulmonary vascular congestion and interstitial edema more conspicuous today. Probable small bilateral pleural effusions. Aortic atherosclerosis. Electronically Signed   By: David  Martinique M.D.   On: 10/10/2015 07:45   Dg C-arm 1-60 Min  Result Date: 10/09/2015 CLINICAL DATA:  Left femur IM nail for intertrochanteric femur fracture. EXAM: DG C-ARM 61-120 MIN; OPERATIVE LEFT HIP WITH PELVIS COMPARISON:  10/08/2015 FINDINGS: Two intraoperative spot fluoro films are submitted. The patient is status post ORIF for intertrochanteric left hip fracture. Two proximal cannulated screws are evident with a long intra great intra medullary femoral nail, incompletely visualized. Bony alignment is improved compared to the pre reduction study. IMPRESSION: Status post ORIF for intertrochanteric left femur fracture. No evidence for immediate hardware complications within the visualized portions of the femur. Electronically Signed   By: Misty Stanley M.D.   On: 10/09/2015 17:57   Dg Hip Operative Unilat W Or W/o Pelvis Left  Result Date: 10/09/2015 CLINICAL DATA:  Left femur IM nail for intertrochanteric femur fracture. EXAM: DG C-ARM 61-120 MIN; OPERATIVE LEFT HIP WITH PELVIS COMPARISON:  10/08/2015 FINDINGS: Two intraoperative spot fluoro films are submitted. The patient is status post ORIF for intertrochanteric left hip fracture. Two proximal cannulated screws are evident with a long intra great intra medullary femoral nail, incompletely visualized. Bony alignment is improved compared to the pre reduction study. IMPRESSION: Status post ORIF for intertrochanteric left femur fracture. No evidence for immediate hardware complications within the visualized portions of the femur. Electronically Signed   By: Misty Stanley M.D.   On: 10/09/2015 17:57        Scheduled Meds: .   ceFAZolin (ANCEF) IV  2 g Intravenous Q12H  . chlorhexidine  15 mL Mouth Rinse BID  . Chlorhexidine Gluconate Cloth  6 each Topical Once  . cholecalciferol  5,000 Units Oral Daily  . digoxin  0.0625 mg Oral Once per day on Mon Tue Wed Thu Fri  . enoxaparin (LOVENOX) injection  40 mg Subcutaneous Q24H  . feeding supplement (PRO-STAT SUGAR FREE 64)  30 mL Oral BID  . lidocaine  1 patch Transdermal Q24H  . magnesium gluconate  500 mg Oral Daily  . mouth rinse  15 mL Mouth Rinse q12n4p  . metolazone  2.5 mg Oral Once per day on Mon Thu  . multivitamin with minerals  1 tablet Oral Daily  . potassium chloride  20 mEq Oral Daily  . saccharomyces boulardii  250 mg Oral BID  . torsemide  20 mg Oral Daily  . verapamil  120 mg Oral Daily  . warfarin  5 mg Oral ONCE-1800  . Warfarin - Pharmacist Dosing Inpatient   Does not apply q1800   Continuous Infusions: . sodium chloride 125 mL/hr at 10/10/15 0700  . lactated ringers 10 mL/hr at 10/09/15 1450     LOS: 2 days    Time spent: 45 minutes.    Birdie Hopes, MD [facility] Pager [phone]  If 7PM-7AM, please contact night-coverage [email or web address] Password [facility] 10/10/2015, 11:49 AM

## 2015-10-11 ENCOUNTER — Inpatient Hospital Stay (HOSPITAL_COMMUNITY): Payer: Medicare Other

## 2015-10-11 LAB — CBC
HCT: 29.3 % — ABNORMAL LOW (ref 36.0–46.0)
Hemoglobin: 8.7 g/dL — ABNORMAL LOW (ref 12.0–15.0)
MCH: 21.9 pg — ABNORMAL LOW (ref 26.0–34.0)
MCHC: 29.7 g/dL — ABNORMAL LOW (ref 30.0–36.0)
MCV: 73.6 fL — ABNORMAL LOW (ref 78.0–100.0)
Platelets: 241 10*3/uL (ref 150–400)
RBC: 3.98 MIL/uL (ref 3.87–5.11)
RDW: 16.6 % — ABNORMAL HIGH (ref 11.5–15.5)
WBC: 6.3 10*3/uL (ref 4.0–10.5)

## 2015-10-11 LAB — BASIC METABOLIC PANEL
Anion gap: 11 (ref 5–15)
BUN: 30 mg/dL — ABNORMAL HIGH (ref 6–20)
CO2: 27 mmol/L (ref 22–32)
Calcium: 8.8 mg/dL — ABNORMAL LOW (ref 8.9–10.3)
Chloride: 94 mmol/L — ABNORMAL LOW (ref 101–111)
Creatinine, Ser: 1.79 mg/dL — ABNORMAL HIGH (ref 0.44–1.00)
GFR calc Af Amer: 28 mL/min — ABNORMAL LOW (ref >60)
GFR calc non Af Amer: 24 mL/min — ABNORMAL LOW (ref >60)
Glucose, Bld: 94 mg/dL (ref 65–99)
Potassium: 4.4 mmol/L (ref 3.5–5.1)
Sodium: 132 mmol/L — ABNORMAL LOW (ref 135–145)

## 2015-10-11 LAB — BRAIN NATRIURETIC PEPTIDE: B Natriuretic Peptide: 582.3 pg/mL — ABNORMAL HIGH (ref 0.0–100.0)

## 2015-10-11 LAB — MAGNESIUM: MAGNESIUM: 1.9 mg/dL (ref 1.7–2.4)

## 2015-10-11 NOTE — Progress Notes (Signed)
Pt taken off Bipap and placed on partial rebreather by RN.

## 2015-10-11 NOTE — Evaluation (Signed)
Occupational Therapy Evaluation Patient Details Name: [name] MRN: [id number] DOB: 11/30/1923 Today's Date: 10/11/2015    History of Present Illness 82 y.o. female with medical history significant of anemia, A. fib on chronic anticoagulation of Coumadin, chronic diastolic CHF (last echo in 01/2015 with EF of 55-60%), chronic pericardial effusion, recurrent diverticulitis s/p colostomy; who presented to the ED after having a fall. She sustained a L hip fx and underwent IM nailing 10/09/15.   Clinical Impression   Patient is s/p L IM NAIL hip surgery resulting in functional limitations due to the deficits listed below (see OT problem list). PTA was living with sister and 24/7 caregiver available to (A) with adls. Pt has had 7 admissions this year.  Patient will benefit from skilled OT acutely to increase independence and safety with ADLS to allow discharge SNF.     Follow Up Recommendations  SNF;Supervision/Assistance - 24 hour    Equipment Recommendations  3 in 1 bedside comode;Wheelchair (measurements OT);Wheelchair cushion (measurements OT);Hospital bed    Recommendations for Other Services       Precautions / Restrictions Precautions Precautions: Fall Precaution Comments: pain in back Restrictions Weight Bearing Restrictions: No LLE Weight Bearing: Weight bearing as tolerated      Mobility Bed Mobility Overal bed mobility: Needs Assistance Bed Mobility: Rolling;Supine to Sit;Sit to Supine Rolling: Max assist   Supine to sit: Max assist;+2 for physical assistance Sit to supine: Total assist;+2 for physical assistance   General bed mobility comments: initiated scooting using R LE and able to lift buttock briefly. Pt requires (A) with pad due to pad and sequence task  Transfers                 General transfer comment: NT due to pain, pt with eyes closed throughout session; respiratory status    Balance Overall balance assessment: Needs  assistance Sitting-balance support: Feet supported Sitting balance-Leahy Scale: Poor Sitting balance - Comments: initially max A for sitting balance, then progressed to min A; sat EOB about 10 minutes with eyes closed all but about 10 seconds,  Did participate in some UE movements, but weaker on L UE kept head tipped and rotated to R, Was able to take some ice chips in sitting, but was unable to use a straw for getting water                                    ADL Overall ADL's : Needs assistance/impaired Eating/Feeding: Maximal assistance Eating/Feeding Details (indicate cue type and reason): pt eating several ice chips at EOB pt requesting ice cream Grooming: Wash/dry face;Maximal assistance   Upper Body Bathing: Maximal assistance   Lower Body Bathing: Total assistance   Upper Body Dressing : Maximal assistance   Lower Body Dressing: Total assistance                 General ADL Comments: Pt sitting EOB during session. Due to arousal and pain level only EOB this session. pt was not appropriate at this time for out of bed     Vision     Perception     Praxis      Pertinent Vitals/Pain Pain Assessment: Faces Faces Pain Scale: Hurts whole lot Pain Location: L hip with mobility Pain Descriptors / Indicators: Grimacing;Operative site guarding Pain Intervention(s): Limited activity within patient's tolerance;Monitored during session;Repositioned;Premedicated before session     Hand Dominance Right  Extremity/Trunk Assessment Upper Extremity Assessment Upper Extremity Assessment: LUE deficits/detail LUE Deficits / Details: pt with limited movement to L Ue. pt able to move hand but unable to hold against gravity. pt not following commands with L UE.   Lower Extremity Assessment Lower Extremity Assessment: Defer to PT evaluation   Cervical / Trunk Assessment Cervical / Trunk Assessment: Kyphotic   Communication Communication Communication: HOH    Cognition Arousal/Alertness: Lethargic Behavior During Therapy: Flat affect Overall Cognitive Status: Difficult to assess                     General Comments       Exercises       Shoulder Instructions      Home Living Family/patient expects to be discharged to:: Skilled nursing facility Living Arrangements: Other relatives Available Help at Discharge: Family;Friend(s);Personal care attendant;Available 24 hours/day Type of Home: House Home Access: Ramped entrance Entrance Stairs-Number of Steps: 2 Entrance Stairs-Rails: Left Home Layout: One level Alternate Level Stairs-Number of Steps: 12 Alternate Level Stairs-Rails: Left Bathroom Shower/Tub: Teacher, early years/pre: Standard Bathroom Accessibility: Yes How Accessible: Accessible via walker Home Equipment: Walker - 2 wheels   Additional Comments: patient stays with sister who is paralyzed, has caregivers 7 hours/5 days a week. Other family lives close by and often stay.      Prior Functioning/Environment Level of Independence: Needs assistance  Gait / Transfers Assistance Needed: mod I with RW ADL's / Homemaking Assistance Needed: assist with bath            OT Problem List: Decreased strength;Decreased activity tolerance;Impaired balance (sitting and/or standing);Decreased knowledge of use of DME or AE;Decreased knowledge of precautions;Cardiopulmonary status limiting activity;Pain;Decreased safety awareness;Decreased cognition   OT Treatment/Interventions: Self-care/ADL training;Therapeutic exercise;DME and/or AE instruction;Therapeutic activities;Cognitive remediation/compensation;Patient/family education;Balance training    OT Goals(Current goals can be found in the care plan section) Acute Rehab OT Goals Patient Stated Goal: not stated OT Goal Formulation: Patient unable to participate in goal setting Time For Goal Achievement: 10/25/15 Potential to Achieve Goals: Good  OT Frequency:  Min 2X/week   Barriers to D/C: Decreased caregiver support          Co-evaluation PT/OT/SLP Co-Evaluation/Treatment: Yes Reason for Co-Treatment: Complexity of the patient's impairments (multi-system involvement);For patient/therapist safety PT goals addressed during session: Mobility/safety with mobility OT goals addressed during session: ADL's and self-care;Strengthening/ROM      End of Session Equipment Utilized During Treatment: Oxygen Nurse Communication: Mobility status;Precautions  Activity Tolerance: Patient tolerated treatment well Patient left: in bed;with call bell/phone within reach;with family/visitor present   Time: 1205 (1205)-1235 OT Time Calculation (min): 30 min Charges:  OT General Charges $OT Visit: 1 Procedure OT Evaluation $OT Eval Moderate Complexity: 1 Procedure G-Codes:    Parke Poisson B 31-Oct-2015, 3:48 PM   Jeri Modena   OTR/L PagerOH:[phone] Office: [phone] .

## 2015-10-11 NOTE — Progress Notes (Signed)
   Subjective:  Patient on bipap.  Objective:   VITALS:   Vitals:   10/11/15 0350 10/11/15 0431 10/11/15 0844 10/11/15 1141  BP:  (!) 99/55  (!) 109/50  Pulse: 60 61 62 61  Resp:  (!) 21 14 14   Temp:  97.7 F (36.5 C)  (!) 95.9 F (35.5 C)  TempSrc:  Axillary  Axillary  SpO2:  100% 100% 100%  Weight:      Height:       Ortho exam stable   Lab Results  Component Value Date   WBC 6.3 10/11/2015   HGB 8.7 (L) 10/11/2015   HCT 29.3 (L) 10/11/2015   MCV 73.6 (L) 10/11/2015   PLT 241 10/11/2015     Assessment/Plan:  2 Days Post-Op   - having exacerbation of CHF - stable from ortho stand point  Marianna Payment 10/11/2015, 12:18 PM [phone]

## 2015-10-11 NOTE — Progress Notes (Signed)
Physical Therapy Treatment Patient Details Name: [name] MRN: [id number] DOB: 09-05-1923 Today's Date: 10/11/2015    History of Present Illness 82 y.o. female with medical history significant of anemia, A. fib on chronic anticoagulation of Coumadin, chronic diastolic CHF (last echo in 01/2015 with EF of 55-60%), chronic pericardial effusion, recurrent diverticulitis s/p colostomy; who presented to the ED after having a fall. She sustained a L hip fx and underwent IM nailing 10/09/15.    PT Comments    Patient with limited arousal and tolerance due to pain and on 100% NRB.  Still able to improve her sitting balance and participate with some UE movement during session, though L UE weaker than R today.  Also with oral motor strength unable to suck up a straw.  Would recommend SLP for swallow eval.  Continue to recommend SNF level rehab at d/c.  PT to follow acutely  Follow Up Recommendations  SNF;Supervision/Assistance - 24 hour     Equipment Recommendations  None recommended by PT    Recommendations for Other Services       Precautions / Restrictions Precautions Precautions: Fall Precaution Comments: pain in back Restrictions Weight Bearing Restrictions: No LLE Weight Bearing: Weight bearing as tolerated    Mobility  Bed Mobility Overal bed mobility: Needs Assistance Bed Mobility: Rolling;Supine to Sit;Sit to Supine Rolling: Max assist   Supine to sit: Max assist;+2 for physical assistance Sit to supine: Total assist;+2 for physical assistance   General bed mobility comments: initiated scooting using R LE and able to lift buttock briefly. Pt requires (A) with pad due to pad and sequence task  Transfers                 General transfer comment: NT due to pain, pt with eyes closed throughout session; respiratory status  Ambulation/Gait                 Stairs            Wheelchair Mobility    Modified Rankin (Stroke Patients Only)        Balance Overall balance assessment: Needs assistance Sitting-balance support: Feet supported Sitting balance-Leahy Scale: Poor Sitting balance - Comments: initially max A for sitting balance, then progressed to min A; sat EOB about 10 minutes with eyes closed all but about 10 seconds,  Did participate in some UE movements, but weaker on L UE kept head tipped and rotated to R, Was able to take some ice chips in sitting, but was unable to use a straw for getting water                            Cognition Arousal/Alertness: Lethargic Behavior During Therapy: Flat affect Overall Cognitive Status: Difficult to assess                      Exercises General Exercises - Upper Extremity Shoulder Flexion: AAROM;Both;5 reps;Seated    General Comments General comments (skin integrity, edema, etc.): dressing intact. drainage present on bed pad      Pertinent Vitals/Pain Pain Assessment: Faces Faces Pain Scale: Hurts whole lot Pain Location: L hip with mobility Pain Descriptors / Indicators: Grimacing;Operative site guarding Pain Intervention(s): Limited activity within patient's tolerance;Monitored during session;Repositioned;Premedicated before session    Home Living Family/patient expects to be discharged to:: Skilled nursing facility Living Arrangements: Other relatives Available Help at Discharge: Family;Friend(s);Personal care attendant;Available 24 hours/day Type of Home: [place]  Access: Ramped entrance Entrance Stairs-Rails: Left Home Layout: One level Home Equipment: Walker - 2 wheels Additional Comments: patient stays with sister who is paralyzed, has caregivers 7 hours/5 days a week. Other family lives close by and often stay.    Prior Function Level of Independence: Needs assistance  Gait / Transfers Assistance Needed: mod I with RW ADL's / Homemaking Assistance Needed: assist with bath     PT Goals (current goals can now be found in the care plan  section) Acute Rehab PT Goals Patient Stated Goal: not stated Progress towards PT goals: Progressing toward goals    Frequency    Min 3X/week      PT Plan Current plan remains appropriate    Co-evaluation PT/OT/SLP Co-Evaluation/Treatment: Yes Reason for Co-Treatment: Complexity of the patient's impairments (multi-system involvement);For patient/therapist safety PT goals addressed during session: Mobility/safety with mobility OT goals addressed during session: ADL's and self-care;Strengthening/ROM     End of Session   Activity Tolerance: Patient limited by fatigue;Patient limited by lethargy;Patient limited by pain Patient left: in bed;with call bell/phone within reach     Time: 1208-1241 PT Time Calculation (min) (ACUTE ONLY): 33 min  Charges:  $Therapeutic Activity: 8-22 mins                    G Codes:      [name] 10/28/15, 3:49 PM

## 2015-10-11 NOTE — Progress Notes (Signed)
PROGRESS NOTE  [name]  [id number] DOB: Jul 16, 1923  DOA: 10/08/2015 PCP: [name], MD  Primary Cardiologist: Dr. Sanda Klein  Subjective: Seeing with nursing staff at bedside, open her eyes to verbal stimuli but very weak. Had respiratory distress required BiPAP overnight, CXR and BNP suggesting fluid overload/acute CHF. Continue IV diuresis, follow renal function closely.  Brief Narrative:  82 year old female with a PMH of chronic A. fib on Coumadin, chronic diastolic CHF, 2-D echo 123XX123 with LVEF 55-60 percent, HTN, pericardial effusion status post pericardial window 01/16/15, stroke, tachybradycardia syndrome status post pacemaker, anemia, status post recent hospitalization 09/24/15-09/30/15 for urgent sigmoid colectomy with Hartmann's procedure and small bowel resection on 05/31/15 following which transferred to inpatient rehabilitation and then transitioned home, awaiting colostomy reversal/repair of parastomal hernia, presented to ED on 10/08/15 following a mechanical fall at home and sustained left hip pain, right wrist and left forearm pain. Workup revealed left intertrochanteric hip fracture, potassium 2.2. Orthopedics/Dr. Erlinda Hong was consulted and plan for surgery on 10/09/15 after medical optimization.   Assessment & Plan:   Principal Problem:   Closed left hip fracture [facility]) Active Problems:   Pacemaker   Paroxysmal atrial fibrillation (HCC)   Hypokalemia   Anemia   Fall   Acute and chronic respiratory failure with hypoxia (HCC)   Left intertrochanteric hip fracture -Left intratrochanteric hip fracture after mechanical fall, orthopedics consulted status post repair on 10/09/2015 -Coumadin held, INR was 1.5 for the time of surgery. -Cardiology was consulted prior to surgery because of fragility of the patient and previous cardiovascular events. -Post surgery developed mild fluid overload, treated with aggressive diuresis.  Acute respiratory failure with  hypoxia -After the surgery patient spent the night in the step down and required BiPAP and nonrebreather mask transiently -Still shuffling between BiPAP and her breather mask secondary to pulmonary edema. -Started in diuresis not much of response.  Acute on chronic diastolic CHF -She appeared euvolemic on admission, he developed acute respiratory failure with hypoxia, CXR showed more congestion. -Elevated BNP, CXR consistent with acute CHF as well as lower extremity edema. -Started on high-dose Lasix, creatinine worsens  Chronic atrial fibrillation/tachybradycardia syndrome status post pacemaker - CHADS2Vasc 7. INR therapeutic at 2.9 on admission. - Rate is controlled with verapamil and digoxin. Patient was on warfarin for anticoagulation. -Cardiology recommended Eliquis 2.5 mg for anticoagulation when it's okay with general surgery.  Acute on stage III chronic kidney disease - Creatinine at recent discharge 09/30/15:1.2. Now presented with creatinine of 1.5.  - Briefly hydrated with IV fluids. Chest x-ray suggests pulmonary congestion. DC IV fluids.  Hypokalemia - Potassium 2.2 on admission. Mg: 1.7. Replacing aggressively. Follow BMP and magnesium. - Potassium improved to 3.5 with multiple rounds of oral and IV potassium. Repeat oral potassium and check BMP in a.m.  Essential hypertension - Controlled.  Anemia - Stable. Follow CBCs postoperatively.   DVT prophylaxis: SCD's Code Status: Full Family Communication: None at bedside. Discussed with patient. Disposition Plan: Likely SNF when medically ready.   Consultants:   Orthopedics  Procedures:   Foley  Antimicrobials:   None     Objective:  Vitals:   10/10/15 2341 10/11/15 0350 10/11/15 0431 10/11/15 0844  BP: (!) 108/56  (!) 99/55   Pulse: (!) 59 60 61 62  Resp: 19  (!) 21 14  Temp: 97.7 F (36.5 C)  97.7 F (36.5 C)   TempSrc: Axillary  Axillary   SpO2: 99%  100% 100%  Weight:  Height:         Intake/Output Summary (Last 24 hours) at 10/11/15 1106 Last data filed at 10/11/15 0800  Gross per 24 hour  Intake              200 ml  Output              700 ml  Net             -500 ml   Filed Weights   10/08/15 0600 10/09/15 0629  Weight: 70.4 kg (155 lb 1.6 oz) 71.7 kg (158 lb)    Examination:  General exam: Pleasant elderly frail female lying comfortably supine in bed.  Respiratory system: Clear to auscultation. Respiratory effort normal. Cardiovascular system: S1 & S2 heard, Irregularly irregular. No JVD, murmurs, rubs, gallops or clicks. No pedal edema. V paced.  Gastrointestinal system: Abdomen is nondistended, soft and nontender. No organomegaly or masses felt. Normal bowel sounds heard. Central nervous system: Alert and oriented. No focal neurological deficits. Extremities: Symmetric 5 x 5 power except left lower extremity where movements are restricted due to pain.. Skin: No rashes, lesions or ulcers Psychiatry: Judgement and insight appear normal. Mood & affect appropriate.     Data Reviewed: I have personally reviewed following labs and imaging studies  CBC:  Recent Labs Lab 10/08/15 0230 10/08/15 0241 10/09/15 0754 10/10/15 0517 10/11/15 0335  WBC 5.7  --  7.9 8.0 6.3  NEUTROABS 4.0  --   --   --   --   HGB 9.4* 10.5* 8.3* 8.2* 8.7*  HCT 30.6* 31.0* 29.0* 28.1* 29.3*  MCV 70.3*  --  72.9* 73.9* 73.6*  PLT 253  --  225 214 [id number]   Basic Metabolic Panel:  Recent Labs Lab 10/08/15 0241 10/08/15 0334 10/08/15 1956 10/09/15 0754 10/10/15 0517 10/11/15 0335  NA 139  --  136 135 133* 132*  K 2.3* 2.2* 2.6* 3.5 4.2 4.4  CL 91*  --  93* 95* 97* 94*  CO2  --   --  33* 32 27 27  GLUCOSE 123*  --  160* 110* 118* 94  BUN 21*  --  18 18 23* 30*  CREATININE 1.50*  --  1.39* 1.35* 1.68* 1.79*  CALCIUM  --   --  8.9 8.9 8.6* 8.8*  MG  --  1.7 1.9  --   --  1.9   GFR: Estimated Creatinine Clearance: 20.3 mL/min (by C-G formula based on SCr of 1.79  mg/dL (H)). Liver Function Tests: No results for input(s): AST, ALT, ALKPHOS, BILITOT, PROT, ALBUMIN in the last 168 hours. No results for input(s): LIPASE, AMYLASE in the last 168 hours. No results for input(s): AMMONIA in the last 168 hours. Coagulation Profile:  Recent Labs Lab 10/08/15 0230 10/09/15 0754  INR 2.90 1.51   Cardiac Enzymes: No results for input(s): CKTOTAL, CKMB, CKMBINDEX, TROPONINI in the last 168 hours. BNP (last 3 results) No results for input(s): PROBNP in the last 8760 hours. HbA1C: No results for input(s): HGBA1C in the last 72 hours. CBG: No results for input(s): GLUCAP in the last 168 hours. Lipid Profile: No results for input(s): CHOL, HDL, LDLCALC, TRIG, CHOLHDL, LDLDIRECT in the last 72 hours. Thyroid Function Tests: No results for input(s): TSH, T4TOTAL, FREET4, T3FREE, THYROIDAB in the last 72 hours. Anemia Panel: No results for input(s): VITAMINB12, FOLATE, FERRITIN, TIBC, IRON, RETICCTPCT in the last 72 hours.  Sepsis Labs: No results for input(s): PROCALCITON, LATICACIDVEN in the last 168 hours.  Recent Results (from the past 240 hour(s))  MRSA PCR Screening     Status: Abnormal   Collection Time: 10/08/15  3:59 PM  Result Value Ref Range Status   MRSA by PCR POSITIVE (A) NEGATIVE Final    Comment:        The GeneXpert MRSA Assay (FDA approved for NASAL specimens only), is one component of a comprehensive MRSA colonization surveillance program. It is not intended to diagnose MRSA infection nor to guide or monitor treatment for MRSA infections. CRITICAL RESULT CALLED TO, READ BACK BY AND VERIFIED WITH: Stasia Cavalier, RN AT [place] ON 10/08/15 BY C. Sermon, MLT.          Radiology Studies: Dg Chest Port 1 View  Result Date: 10/11/2015 CLINICAL DATA:  CHF EXAM: PORTABLE CHEST 1 VIEW COMPARISON:  10/10/2015, 10/08/2015 FINDINGS: Multiple support leads over the patient obscure the chest. Right-sided pacing device is similar compared  to previous. Marked enlargement of the cardiomediastinal silhouette with globular cardiac configuration. Small bilateral pleural effusions are slightly decreased. Persistent dense airspace consolidation within the bilateral lung bases. Diffuse interstitial edema appears slightly decreased. Atherosclerosis of the aorta. No pneumothorax. IMPRESSION: 1. Marked enlargement of the cardiomediastinal silhouette with globular cardiac configuration, pericardial effusion versus multi chamber enlargement. This does not appear significantly changed in the interim. 2. Slight decrease in small bilateral pleural effusions. Aeration within the lower lung zones is slightly improved. 3. Continued vascular congestion, mildly decreased. Diffuse background interstitial edema also appears slightly decreased. 4. Atherosclerotic vascular disease of the aorta Electronically Signed   By: Donavan Foil M.D.   On: 10/11/2015 10:11   Dg Chest Port 1v Same Day  Result Date: 10/10/2015 CLINICAL DATA:  Shortness of breath, history of chronic CHF, pericardial effusion, previous CVA, recent ORIF for left hip fracture. EXAM: PORTABLE CHEST 1 VIEW COMPARISON:  Chest x-ray of October 08, 2015 FINDINGS: The cardiac silhouette remains enlarged. The pulmonary vascularity remains engorged. The left hemidiaphragm is now obscured and there is partial obscuration of the right hemidiaphragm. The permanent pacemaker is in stable position. There is calcification in the wall of the aortic arch. There is no pneumothorax. The bony thorax exhibits no acute abnormality. IMPRESSION: CHF with pulmonary vascular congestion and interstitial edema more conspicuous today. Probable small bilateral pleural effusions. Aortic atherosclerosis. Electronically Signed   By: David  Martinique M.D.   On: 10/10/2015 07:45   Dg C-arm 1-60 Min  Result Date: 10/09/2015 CLINICAL DATA:  Left femur IM nail for intertrochanteric femur fracture. EXAM: DG C-ARM 61-120 MIN; OPERATIVE  LEFT HIP WITH PELVIS COMPARISON:  10/08/2015 FINDINGS: Two intraoperative spot fluoro films are submitted. The patient is status post ORIF for intertrochanteric left hip fracture. Two proximal cannulated screws are evident with a long intra great intra medullary femoral nail, incompletely visualized. Bony alignment is improved compared to the pre reduction study. IMPRESSION: Status post ORIF for intertrochanteric left femur fracture. No evidence for immediate hardware complications within the visualized portions of the femur. Electronically Signed   By: Misty Stanley M.D.   On: 10/09/2015 17:57   Dg Hip Operative Unilat W Or W/o Pelvis Left  Result Date: 10/09/2015 CLINICAL DATA:  Left femur IM nail for intertrochanteric femur fracture. EXAM: DG C-ARM 61-120 MIN; OPERATIVE LEFT HIP WITH PELVIS COMPARISON:  10/08/2015 FINDINGS: Two intraoperative spot fluoro films are submitted. The patient is status post ORIF for intertrochanteric left hip fracture. Two proximal cannulated screws are evident with a long intra great intra medullary femoral nail,  incompletely visualized. Bony alignment is improved compared to the pre reduction study. IMPRESSION: Status post ORIF for intertrochanteric left femur fracture. No evidence for immediate hardware complications within the visualized portions of the femur. Electronically Signed   By: Misty Stanley M.D.   On: 10/09/2015 17:57        Scheduled Meds: . apixaban  2.5 mg Oral BID  . chlorhexidine  15 mL Mouth Rinse BID  . Chlorhexidine Gluconate Cloth  6 each Topical Once  . cholecalciferol  5,000 Units Oral Daily  . feeding supplement (PRO-STAT SUGAR FREE 64)  30 mL Oral BID  . furosemide  80 mg Intravenous BID  . lidocaine  1 patch Transdermal Q24H  . magnesium gluconate  500 mg Oral Daily  . mouth rinse  15 mL Mouth Rinse q12n4p  . metolazone  2.5 mg Oral Once per day on Mon Thu  . multivitamin with minerals  1 tablet Oral Daily  . mupirocin ointment    Nasal BID  . potassium chloride  20 mEq Oral Daily  . saccharomyces boulardii  250 mg Oral BID  . verapamil  120 mg Oral Daily   Continuous Infusions: . lactated ringers 10 mL/hr at 10/09/15 1450     LOS: 3 days    Time spent: 45 minutes.    Birdie Hopes, MD [facility] Pager [phone]  If 7PM-7AM, please contact night-coverage [email or web address] Password [email or web address] 10/11/2015, 11:06 AM

## 2015-10-11 NOTE — Clinical Social Work Note (Addendum)
CSW met with patient who was sleeping. Grandson at bedside who stated that she had just received some pain medication. Per RN, patient answered all orientation questions correctly but then started asking about a jar on the wall. CSW called and left voicemail for patient's daughter/POA, Helene Kelp [phone]). Per RN, staff have had difficulty getting in touch with her.  Dayton Scrape, [place]  4:30 pm Helene Kelp returned call and stated that she is not sure what her mother's reaction would be to SNF and to call her sister, Melinda Crutch, regarding placement.  Dayton Scrape, [place]

## 2015-10-11 NOTE — Progress Notes (Signed)
Patient is tolerating 15L partial rebreather well at this time, BiPAP not placed on patient. SpO2 96%, HR 60 and RR 11. RT will continue to monitor as needed.

## 2015-10-11 NOTE — Significant Event (Signed)
Spoke with POA Bartholome Bill, over the phone, patient is DNR/DNI. Updated the nursing staff.  Birdie Hopes Pager: [phone] 10/11/2015, 4:54 PM

## 2015-10-11 NOTE — Progress Notes (Signed)
SUBJECTIVE:  Had respiratory distress last night requiring BiPAP  OBJECTIVE:   Vitals:   Vitals:   10/10/15 2100 10/10/15 2341 10/11/15 0350 10/11/15 0431  BP: (!) 89/53 (!) 108/56  (!) 99/55  Pulse: 60 (!) 59 60 61  Resp: 16 19  (!) 21  Temp: 98.5 F (36.9 C) 97.7 F (36.5 C)  97.7 F (36.5 C)  TempSrc: Axillary Axillary  Axillary  SpO2: 100% 99%  100%  Weight:      Height:       I&O's:   Intake/Output Summary (Last 24 hours) at 10/11/15 0844 Last data filed at 10/11/15 0432  Gross per 24 hour  Intake              200 ml  Output              525 ml  Net             -325 ml   TELEMETRY: Reviewed telemetry pt in Atrial fibrillation with V paced rhythm:     PHYSICAL EXAM General: Well developed, well nourished, in no acute distress Head: Eyes PERRLA, No xanthomas.   Normal cephalic and atramatic  Lungs: decreased BS throughout from poor inspiratory effort Heart:   HRRR S1 S2 Pulses are 2+ & equal. Abdomen: Bowel sounds are positive, abdomen soft and non-tender without masses Msk:  Back normal, normal gait. Normal strength and tone for age. Extremities:   Trace edema Neuro: Alert and oriented X 3. Psych:  sleepy   LABS: Basic Metabolic Panel:  Recent Labs  10/08/15 1956  10/10/15 0517 10/11/15 0335  NA 136  < > 133* 132*  K 2.6*  < > 4.2 4.4  CL 93*  < > 97* 94*  CO2 33*  < > 27 27  GLUCOSE 160*  < > 118* 94  BUN 18  < > 23* 30*  CREATININE 1.39*  < > 1.68* 1.79*  CALCIUM 8.9  < > 8.6* 8.8*  MG 1.9  --   --  1.9  < > = values in this interval not displayed. Liver Function Tests: No results for input(s): AST, ALT, ALKPHOS, BILITOT, PROT, ALBUMIN in the last 72 hours. No results for input(s): LIPASE, AMYLASE in the last 72 hours. CBC:  Recent Labs  10/10/15 0517 10/11/15 0335  WBC 8.0 6.3  HGB 8.2* 8.7*  HCT 28.1* 29.3*  MCV 73.9* 73.6*  PLT 214 241   Cardiac Enzymes: No results for input(s): CKTOTAL, CKMB, CKMBINDEX, TROPONINI in the last  72 hours. BNP: Invalid input(s): POCBNP D-Dimer: No results for input(s): DDIMER in the last 72 hours. Hemoglobin A1C: No results for input(s): HGBA1C in the last 72 hours. Fasting Lipid Panel: No results for input(s): CHOL, HDL, LDLCALC, TRIG, CHOLHDL, LDLDIRECT in the last 72 hours. Thyroid Function Tests: No results for input(s): TSH, T4TOTAL, T3FREE, THYROIDAB in the last 72 hours.  Invalid input(s): FREET3 Anemia Panel: No results for input(s): VITAMINB12, FOLATE, FERRITIN, TIBC, IRON, RETICCTPCT in the last 72 hours. Coag Panel:   Lab Results  Component Value Date   INR 1.51 10/09/2015   INR 2.90 10/08/2015   INR 2.01 09/30/2015    RADIOLOGY: Dg Chest 2 View  Result Date: 10/08/2015 CLINICAL DATA:  Fall tonight, on anticoagulation. EXAM: CHEST  2 VIEW COMPARISON:  Chest radiograph 09/28/2015, included lung bases from CT abdomen/pelvis 09/24/2015. FINDINGS: Multi lead right-sided pacemaker. Larger into the cardiac silhouette. Abnormal 1 density abutting the right heart border likely secondary  to large pericardial effusion as seen on recent CT. Small bilateral pleural effusions. There is mild vascular congestion. No pneumothorax or new parenchymal opacity. No acute osseous abnormalities are seen, thoracic spine obscured on the lateral view. IMPRESSION: 1. Development of vascular congestion. Stable enlarged cardiac silhouette, pericardial effusion noted on recent prior abdominal CTs. 2. Small bilateral pleural effusions. Electronically Signed   By: Jeb Levering M.D.   On: 10/08/2015 02:32   Dg Chest 2 View  Result Date: 09/28/2015 CLINICAL DATA:  Shortness of breath. EXAM: CHEST  2 VIEW COMPARISON:  06/06/2015 FINDINGS: The pacer wires are stable. Stable cardiac enlargement and tortuous and calcified thoracic aorta. Chronic underlying lung disease with superimposed pleural effusions and bibasilar infiltrates. The bony thorax is intact. IMPRESSION: Stable cardiac enlargement.  Bilateral pleural effusions and bibasilar infiltrates. Electronically Signed   By: Marijo Sanes M.D.   On: 09/28/2015 08:27   Dg Forearm Left  Result Date: 10/08/2015 CLINICAL DATA:  [place] tonight EXAM: LEFT FOREARM - 2 VIEW COMPARISON:  None. FINDINGS: There is no evidence of fracture or other focal bone lesions. Soft tissues are unremarkable. IMPRESSION: Negative. Electronically Signed   By: Andreas Newport M.D.   On: 10/08/2015 02:28   Dg Wrist Complete Right  Result Date: 10/08/2015 CLINICAL DATA:  [place] tonight EXAM: RIGHT WRIST - COMPLETE 3+ VIEW COMPARISON:  None. FINDINGS: There is no evidence of fracture or dislocation. There is no evidence of arthropathy or other focal bone abnormality. Soft tissues are unremarkable. IMPRESSION: Negative. Electronically Signed   By: Andreas Newport M.D.   On: 10/08/2015 02:29   Dg Abd 1 View  Result Date: 09/25/2015 CLINICAL DATA:  Bowel obstruction due to peristomal herniation of small bowel. EXAM: ABDOMEN - 1 VIEW COMPARISON:  CT scans dated 09/24/2015 and radiographs dated 06/11/2015 FINDINGS: The small bowel distention visible on the prior CT scan has resolved. The bowel gas pattern is normal. Colostomy in the left lower quadrant is noted. Aortic atherosclerosis.  Cholecystectomy. IMPRESSION: Resolution of small bowel obstruction. Aortic atherosclerosis. Electronically Signed   By: Lorriane Shire M.D.   On: 09/25/2015 09:39   Ct Head Wo Contrast  Result Date: 10/08/2015 CLINICAL DATA:  [place] today. EXAM: CT HEAD WITHOUT CONTRAST TECHNIQUE: Contiguous axial images were obtained from the base of the skull through the vertex without intravenous contrast. COMPARISON:  05/18/2011 FINDINGS: Brain: There is no intracranial hemorrhage, mass or evidence of acute infarction. Remote infarction in the posterior right frontal convexity. There is mild generalized atrophy. There is mild chronic microvascular ischemic change. There is no significant extra-axial fluid  collection. No acute intracranial findings are evident. Vascular: No hyperdense vessel or unexpected calcification. Skull: Normal. Negative for fracture or focal lesion. Sinuses/Orbits: No acute finding. IMPRESSION: No acute intracranial findings. There is mild generalized atrophy and chronic appearing white matter hypodensities which likely represent small vessel ischemic disease. Small remote infarction in the posterior right frontal convexity. Electronically Signed   By: Andreas Newport M.D.   On: 10/08/2015 02:35   Ct Cervical Spine Wo Contrast  Result Date: 09/27/2015 CLINICAL DATA:  82 year old female with recent bowel surgery due to diverticulitis. Cervicothoracic pain radiating to the left shoulder. No known injury. Initial encounter. EXAM: CT CERVICAL SPINE WITHOUT CONTRAST TECHNIQUE: Multidetector CT imaging of the cervical spine was performed without intravenous contrast. Multiplanar CT image reconstructions were also generated. COMPARISON:  Neck radiographs 10/17/2011. FINDINGS: Study is intermittently, up to moderately degraded by motion artifact despite repeated imaging attempts. Osteopenia. Visualized  skull base is intact. No atlanto-occipital dissociation. Straightening and mild reversal of cervical lordosis. Cervicothoracic junction alignment is within normal limits. Bilateral posterior element alignment is within normal limits. No cervical spine fracture identified. Negative visualized posterior fossa. Calcified atherosclerosis at the skull base. Visible tympanic cavities and mastoids are clear. Mild for age calcified carotid atherosclerosis in the neck. Partially retropharyngeal course of the carotids. Otherwise negative noncontrast neck soft tissues. Negative lung apices. Chronic cervical spine disc and posterior element degeneration. Mild degenerative appearing anterolisthesis at C3-C4 and C4-C5. Still, there is only mild if any cervical spinal stenosis suspected, primarily at C5-C6.  IMPRESSION: 1. Osteopenia. Motion degraded exam despite repeated imaging attempts. No acute osseous abnormality identified in the cervical spine. 2. Cervical spine degeneration, but only mild if any suspected cervical spinal stenosis. Electronically Signed   By: Genevie Ann M.D.   On: 09/27/2015 15:01   Ct Thoracic Spine Wo Contrast  Result Date: 09/27/2015 CLINICAL DATA:  82 year old female with recent bowel surgery due to diverticulitis. Cervicothoracic pain radiating to the left shoulder. No known injury. Initial encounter. EXAM: CT THORACIC SPINE WITHOUT CONTRAST TECHNIQUE: Multidetector CT imaging of the thoracic spine was performed without intravenous contrast administration. Multiplanar CT image reconstructions were also generated. COMPARISON:  Cervical spine CT from today reported separately. Thoracic spine radiographs 08/28/2015. Chest CT 07/04/2011. FINDINGS: Osteopenia. Normal thoracic segmentation. Stable mild exaggeration of thoracic kyphosis since 2013. Thoracic vertebrae appear intact. Posterior ribs appear intact. Visible upper lumbar levels appear intact. No CT evidence of thoracic spinal stenosis. Mild for age thoracic spine degeneration. Calcified aortic atherosclerosis. Cardiomegaly. Pericardial effusion not well visualized today. Small to moderate right and small left layering pleural effusions. Negative visualized noncontrast upper abdominal viscera. Patchy and confluent left lower lobe pulmonary opacity, appears peribronchial and inflammatory in nature. IMPRESSION: 1. Osteopenia. No acute osseous abnormality identified. In the thoracic spine. Mild for age thoracic spine degeneration. 2. Confluent left lower lobe pulmonary opacity could reflect aspiration or pneumonia in this setting. 3. Moderate layering right and small layering left pleural effusions. 4.  Calcified aortic atherosclerosis. 5. Cardiomegaly. Large pericardial effusion better demonstrated on CT Abdomen and Pelvis 09/24/2015  (please see that report). Electronically Signed   By: Genevie Ann M.D.   On: 09/27/2015 15:07   Dg Chest Port 1v Same Day  Result Date: 10/10/2015 CLINICAL DATA:  Shortness of breath, history of chronic CHF, pericardial effusion, previous CVA, recent ORIF for left hip fracture. EXAM: PORTABLE CHEST 1 VIEW COMPARISON:  Chest x-ray of October 08, 2015 FINDINGS: The cardiac silhouette remains enlarged. The pulmonary vascularity remains engorged. The left hemidiaphragm is now obscured and there is partial obscuration of the right hemidiaphragm. The permanent pacemaker is in stable position. There is calcification in the wall of the aortic arch. There is no pneumothorax. The bony thorax exhibits no acute abnormality. IMPRESSION: CHF with pulmonary vascular congestion and interstitial edema more conspicuous today. Probable small bilateral pleural effusions. Aortic atherosclerosis. Electronically Signed   By: David  Martinique M.D.   On: 10/10/2015 07:45   Dg C-arm 1-60 Min  Result Date: 10/09/2015 CLINICAL DATA:  Left femur IM nail for intertrochanteric femur fracture. EXAM: DG C-ARM 61-120 MIN; OPERATIVE LEFT HIP WITH PELVIS COMPARISON:  10/08/2015 FINDINGS: Two intraoperative spot fluoro films are submitted. The patient is status post ORIF for intertrochanteric left hip fracture. Two proximal cannulated screws are evident with a long intra great intra medullary femoral nail, incompletely visualized. Bony alignment is improved compared to the pre  reduction study. IMPRESSION: Status post ORIF for intertrochanteric left femur fracture. No evidence for immediate hardware complications within the visualized portions of the femur. Electronically Signed   By: Misty Stanley M.D.   On: 10/09/2015 17:57   Ct Renal Stone Study  Result Date: 09/24/2015 CLINICAL DATA:  Acute on chronic back pain. Left flank pain. Leukocytosis. EXAM: CT ABDOMEN AND PELVIS WITHOUT CONTRAST TECHNIQUE: Multidetector CT imaging of the abdomen and  pelvis was performed following the standard protocol without IV contrast. COMPARISON:  09/23/2015 FINDINGS: Lower chest: Large and possibly loculated pericardial effusion, similar to prior. Extensive coronary atherosclerosis. Calcified mitral valve. Pacer leads. Moderate cardiomegaly. Mild atelectasis in the right middle lobe and lingula. Mild atelectasis in both lower lobes. Hepatobiliary: Cholecystectomy Pancreas: Unremarkable Spleen: Unremarkable Adrenals/Urinary Tract: Abnormal appearance the kidneys with cortical contrast remaining on board, but some clearing of the medullary contrast. As far as I am aware the patient received contrast 33 hours prior to today's scan, the persistent contrast in the cortex is indicative of renal dysfunction. There is some excreted contrast in the collecting systems in urinary bladder is well. Scarring in the kidneys, right greater than left. Bilateral renal cysts. Stomach/Bowel: Small bowel obstruction due to a peristomal hernia of small bowel, about the junction of the jejunum and ileum, extending above the descending colostomy site. Small bowel loops up to 4.1 cm. Distended stomach. Appendix normal. Rectal stump unremarkable. Vascular/Lymphatic: Aortoiliac atherosclerotic vascular disease. Scattered small retroperitoneal lymph nodes. Reproductive: Unremarkable Other: Diffuse subcutaneous and mesenteric edema, query generalized third spacing of fluid. Edema is most notable along the lower anterior pelvic wall and pubis region. Musculoskeletal: Lumbar spondylosis and degenerative disc disease. IMPRESSION: 1. Small bowel obstruction due to a peristomal herniation of small bowel just above the descending colostomy. Small bowel dilated up to 4.1 cm in the stomach is distended. 2. Abnormal appearance the kidneys, with retained contrast medium in the renal cortex bilaterally, cannot exclude acute renal failure or contrast nephropathy causing this appearance. The patient last received  IV contrast about 33 hours prior to today's scan, as best I know. 3. Stable large and possibly loculated pericardial effusion with extensive coronary atherosclerosis, calcified mitral valve, and moderate cardiomegaly. 4.  Aortoiliac atherosclerotic vascular disease. 5. Third spacing of fluid with generalized mesenteric and subcutaneous edema. 6. I do not see any renal stones but sensitivity for nonobstructive stones is low due to the presence of contrast medium within the urinary tract left over from yesterday. Electronically Signed   By: Van Clines M.D.   On: 09/24/2015 20:14   Dg Hip Operative Unilat W Or W/o Pelvis Left  Result Date: 10/09/2015 CLINICAL DATA:  Left femur IM nail for intertrochanteric femur fracture. EXAM: DG C-ARM 61-120 MIN; OPERATIVE LEFT HIP WITH PELVIS COMPARISON:  10/08/2015 FINDINGS: Two intraoperative spot fluoro films are submitted. The patient is status post ORIF for intertrochanteric left hip fracture. Two proximal cannulated screws are evident with a long intra great intra medullary femoral nail, incompletely visualized. Bony alignment is improved compared to the pre reduction study. IMPRESSION: Status post ORIF for intertrochanteric left femur fracture. No evidence for immediate hardware complications within the visualized portions of the femur. Electronically Signed   By: Misty Stanley M.D.   On: 10/09/2015 17:57   Dg Hip Unilat W Or Wo Pelvis 2-3 Views Left  Result Date: 10/08/2015 CLINICAL DATA:  Ground level fall tonight. EXAM: DG HIP (WITH OR WITHOUT PELVIS) 2-3V LEFT COMPARISON:  None. FINDINGS: There is an acute  intertrochanteric left hip fracture with mild varus angulation. No dislocation. No radiographic findings to suggest a pathologic basis for the fracture. IMPRESSION: Intertrochanteric left hip fracture. Electronically Signed   By: Andreas Newport M.D.   On: 10/08/2015 02:28    IMPRESSION/PLAN: 1.  Hip fracture - s/p repair 2.  AFib, permanent:Rate  controlled on verapamil and dig.  Dig level < 1.   It will likely be impossible to quickly resume warfarin anticoagulation since she has received large doses of vitamin K. I think she is best suited for postoperative DVT and stroke prophylaxis with a direct oral anticoagulant such as Eliquis. This would also allow more versatility for management of anticoagulation around the time of future possible surgery for colostomy takedown, which she was planning. Started on Eliquis this am. Will hold dig today given worsening renal function. 3.  Acute on chronic Diastolic heart failure:had increased edema yesterday and developed respiratory distress with increased O2 demands.  Cxray showed CHF and started on high dose IV diuretics.  Her creatinine has bumped again today but in looking back it started to trend upward yesterday in the setting of acute CHF exacerbation so suspect worsening renal function more from CHF and should get better with diuresis.  Continue IV Lasix for now and repeat BMET in am. .  UOP only 525cc total from yesterday. Will repeat cxray this am.   4.  Pericardial effusion: This is located exclusively posterior to the heart and is relatively small in overall volume. She does not appear to be of any hemodynamic consequence at this time. 5.  AS: Mild and unlikely to be clinically relevant in the near future. 6.  TR: She has severe tricuspid regurgitation and at least moderate pulmonary artery hypertension. Most likely this is related to chronic diastolic heart failure, but may make her sensitive to aggressive volume shifts (for example hypotension with rapid Diuresis). 7.  Severe hypokalemia: repleted 8.  Pacemaker: Normal device function and recent check. Hip surgery is unlikely to interfere with pacemaker function. She is not pacemaker dependent.    Fransico Him, MD  10/11/2015  8:44 AM

## 2015-10-12 DIAGNOSIS — I482 Chronic atrial fibrillation: Secondary | ICD-10-CM

## 2015-10-12 DIAGNOSIS — I5033 Acute on chronic diastolic (congestive) heart failure: Secondary | ICD-10-CM

## 2015-10-12 DIAGNOSIS — I509 Heart failure, unspecified: Secondary | ICD-10-CM

## 2015-10-12 DIAGNOSIS — J9621 Acute and chronic respiratory failure with hypoxia: Secondary | ICD-10-CM

## 2015-10-12 LAB — CBC
HCT: 27 % — ABNORMAL LOW (ref 36.0–46.0)
Hemoglobin: 7.7 g/dL — ABNORMAL LOW (ref 12.0–15.0)
MCH: 21.1 pg — ABNORMAL LOW (ref 26.0–34.0)
MCHC: 28.5 g/dL — ABNORMAL LOW (ref 30.0–36.0)
MCV: 74 fL — ABNORMAL LOW (ref 78.0–100.0)
Platelets: 289 10*3/uL (ref 150–400)
RBC: 3.65 MIL/uL — ABNORMAL LOW (ref 3.87–5.11)
RDW: 16.6 % — ABNORMAL HIGH (ref 11.5–15.5)
WBC: 7.4 10*3/uL (ref 4.0–10.5)

## 2015-10-12 LAB — PREPARE RBC (CROSSMATCH)

## 2015-10-12 MED ORDER — METOLAZONE 2.5 MG PO TABS: 2.5000 mg | ORAL_TABLET | Freq: Once | ORAL | Status: DC

## 2015-10-12 MED ORDER — METOLAZONE 2.5 MG PO TABS: 2.5000 mg | ORAL_TABLET | Freq: Every day | ORAL | Status: DC

## 2015-10-12 MED ORDER — FUROSEMIDE 10 MG/ML IJ SOLN: 80.0000 mg | Freq: Three times a day (TID) | INTRAMUSCULAR | Status: DC

## 2015-10-12 MED ORDER — SODIUM CHLORIDE 0.9 % IV SOLN
Freq: Once | INTRAVENOUS | Status: AC
  Administered 2015-10-12: 10 mL/h via INTRAVENOUS

## 2015-10-12 NOTE — Progress Notes (Signed)
PROGRESS NOTE  [name]  [id number] DOB: [birth year]  DOA: 10/08/2015 PCP: [name], MD  Primary Cardiologist: Dr. Sanda Klein  Subjective: Seen with her POA daughter Helene Kelp at bedside, discussed her status. DNR/DNI, palliative medicine team to help with goals of care.  Brief Narrative:  82 year old female with a PMH of chronic A. fib on Coumadin, chronic diastolic CHF, 2-D echo [id number] with LVEF 55-60 percent, HTN, pericardial effusion status post pericardial window 01/16/15, stroke, tachybradycardia syndrome status post pacemaker, anemia, status post recent hospitalization 09/24/15-09/30/15 for urgent sigmoid colectomy with Hartmann's procedure and small bowel resection on 05/31/15 following which transferred to inpatient rehabilitation and then transitioned home, awaiting colostomy reversal/repair of parastomal hernia, presented to ED on 10/08/15 following a mechanical fall at home and sustained left hip pain, right wrist and left forearm pain. Workup revealed left intertrochanteric hip fracture, potassium 2.2. Orthopedics/Dr. Erlinda Hong was consulted and plan for surgery on 10/09/15 after medical optimization.   Assessment & Plan:   Principal Problem:   Closed left hip fracture [facility]) Active Problems:   Pacemaker   Paroxysmal atrial fibrillation (HCC)   Hypokalemia   Anemia   Fall   Acute and chronic respiratory failure with hypoxia (HCC)   Left intertrochanteric hip fracture -Left intratrochanteric hip fracture after mechanical fall, orthopedics consulted status post repair on 10/09/2015 -Coumadin held, INR was 1.5 for the time of surgery. -Cardiology was consulted prior to surgery because of fragility of the patient and previous cardiovascular events. -Post surgery developed mild fluid overload, treated with aggressive diuresis with suboptimal response.  Acute respiratory failure with hypoxia -After the surgery patient spent the night in the step down and required BiPAP and  nonrebreather mask transiently -Still shuffling between BiPAP and her breather mask secondary to pulmonary edema. -Continue to diurese with IV Lasix, urine output is not that great, will give extra dose of Lasix. -Still on partial rebreather mask, I will ask palliative medicine team to evaluate for goals of care.  Acute on chronic diastolic CHF -She appeared euvolemic on admission, he developed acute respiratory failure with hypoxia, CXR showed more congestion. -Elevated BNP, CXR consistent with acute CHF as well as lower extremity edema. -Started on high-dose Lasix, creatinine worsens  Chronic atrial fibrillation/tachybradycardia syndrome status post pacemaker - CHADS2Vasc 7. INR therapeutic at 2.9 on admission. - Rate is controlled with verapamil and digoxin. Patient was on warfarin for anticoagulation. -Cardiology recommended Eliquis 2.5 mg for anticoagulation when it's okay with general surgery.  Acute on stage III chronic kidney disease - Creatinine at recent discharge 09/30/15:1.2. Now presented with creatinine of 1.5.  - Briefly hydrated with IV fluids. Chest x-ray suggests pulmonary congestion. DC IV fluids.  Hypokalemia - Potassium 2.2 on admission. Mg: 1.7. Replacing aggressively. Follow BMP and magnesium. - Potassium improved to 3.5 with multiple rounds of oral and IV potassium. Repeat oral potassium and check BMP in a.m.  Essential hypertension - Controlled.  Anemia -Hemoglobin dropped to 7.7, I'm not sure with the fluid overload she will benefit that much from transfusion. -Monitor closely, if dropped less than 7.5 might need transfusion.   DVT prophylaxis: SCD's Code Status: Full Family Communication: Daughters Helene Kelp and [place] Disposition Plan: TBA   Consultants:   Orthopedics.  Cardiology  Procedures:   Foley  Antimicrobials:   None     Objective:  Vitals:   10/11/15 1959 10/12/15 0013 10/12/15 0343 10/12/15 0734  BP: (!) 93/46 (!) 85/44 (!)  105/48   Pulse: 60 60 67  Resp: 15 11 15    Temp: 97.7 F (36.5 C) 97.7 F (36.5 C) 97.4 F (36.3 C) 97.9 F (36.6 C)  TempSrc: Axillary Axillary Axillary Axillary  SpO2: 95% 95% 92%   Weight:      Height:        Intake/Output Summary (Last 24 hours) at 10/12/15 1136 Last data filed at 10/12/15 0800  Gross per 24 hour  Intake              120 ml  Output              375 ml  Net             -255 ml   Filed Weights   10/08/15 0600 10/09/15 0629  Weight: 70.4 kg (155 lb 1.6 oz) 71.7 kg (158 lb)    Examination:  General exam: Pleasant elderly frail female lying comfortably supine in bed.  Respiratory system: Clear to auscultation. Respiratory effort normal. Cardiovascular system: S1 & S2 heard, Irregularly irregular. No JVD, murmurs, rubs, gallops or clicks. No pedal edema. V paced.  Gastrointestinal system: Abdomen is nondistended, soft and nontender. No organomegaly or masses felt. Normal bowel sounds heard. Central nervous system: Alert and oriented. No focal neurological deficits. Extremities: Symmetric 5 x 5 power except left lower extremity where movements are restricted due to pain.. Skin: No rashes, lesions or ulcers Psychiatry: Judgement and insight appear normal. Mood & affect appropriate.     Data Reviewed: I have personally reviewed following labs and imaging studies  CBC:  Recent Labs Lab 10/08/15 0230 10/08/15 0241 10/09/15 0754 10/10/15 0517 10/11/15 0335 10/12/15 0306  WBC 5.7  --  7.9 8.0 6.3 7.4  NEUTROABS 4.0  --   --   --   --   --   HGB 9.4* 10.5* 8.3* 8.2* 8.7* 7.7*  HCT 30.6* 31.0* 29.0* 28.1* 29.3* 27.0*  MCV 70.3*  --  72.9* 73.9* 73.6* 74.0*  PLT 253  --  225 214 241 [id number]   Basic Metabolic Panel:  Recent Labs Lab 10/08/15 0241 10/08/15 0334 10/08/15 1956 10/09/15 0754 10/10/15 0517 10/11/15 0335  NA 139  --  136 135 133* 132*  K 2.3* 2.2* 2.6* 3.5 4.2 4.4  CL 91*  --  93* 95* 97* 94*  CO2  --   --  33* 32 27 27  GLUCOSE 123*   --  160* 110* 118* 94  BUN 21*  --  18 18 23* 30*  CREATININE 1.50*  --  1.39* 1.35* 1.68* 1.79*  CALCIUM  --   --  8.9 8.9 8.6* 8.8*  MG  --  1.7 1.9  --   --  1.9   GFR: Estimated Creatinine Clearance: 20.3 mL/min (by C-G formula based on SCr of 1.79 mg/dL (H)). Liver Function Tests: No results for input(s): AST, ALT, ALKPHOS, BILITOT, PROT, ALBUMIN in the last 168 hours. No results for input(s): LIPASE, AMYLASE in the last 168 hours. No results for input(s): AMMONIA in the last 168 hours. Coagulation Profile:  Recent Labs Lab 10/08/15 0230 10/09/15 0754  INR 2.90 1.51   Cardiac Enzymes: No results for input(s): CKTOTAL, CKMB, CKMBINDEX, TROPONINI in the last 168 hours. BNP (last 3 results) No results for input(s): PROBNP in the last 8760 hours. HbA1C: No results for input(s): HGBA1C in the last 72 hours. CBG: No results for input(s): GLUCAP in the last 168 hours. Lipid Profile: No results for input(s): CHOL, HDL, LDLCALC, TRIG, CHOLHDL, LDLDIRECT in the  last 72 hours. Thyroid Function Tests: No results for input(s): TSH, T4TOTAL, FREET4, T3FREE, THYROIDAB in the last 72 hours. Anemia Panel: No results for input(s): VITAMINB12, FOLATE, FERRITIN, TIBC, IRON, RETICCTPCT in the last 72 hours.  Sepsis Labs: No results for input(s): PROCALCITON, LATICACIDVEN in the last 168 hours.  Recent Results (from the past 240 hour(s))  MRSA PCR Screening     Status: Abnormal   Collection Time: 10/08/15  3:59 PM  Result Value Ref Range Status   MRSA by PCR POSITIVE (A) NEGATIVE Final    Comment:        The GeneXpert MRSA Assay (FDA approved for NASAL specimens only), is one component of a comprehensive MRSA colonization surveillance program. It is not intended to diagnose MRSA infection nor to guide or monitor treatment for MRSA infections. CRITICAL RESULT CALLED TO, READ BACK BY AND VERIFIED WITH: Stasia Cavalier, RN AT [place] ON 10/08/15 BY C. Barillas, MLT.           Radiology Studies: Dg Chest Port 1 View  Result Date: 10/11/2015 CLINICAL DATA:  CHF EXAM: PORTABLE CHEST 1 VIEW COMPARISON:  10/10/2015, 10/08/2015 FINDINGS: Multiple support leads over the patient obscure the chest. Right-sided pacing device is similar compared to previous. Marked enlargement of the cardiomediastinal silhouette with globular cardiac configuration. Small bilateral pleural effusions are slightly decreased. Persistent dense airspace consolidation within the bilateral lung bases. Diffuse interstitial edema appears slightly decreased. Atherosclerosis of the aorta. No pneumothorax. IMPRESSION: 1. Marked enlargement of the cardiomediastinal silhouette with globular cardiac configuration, pericardial effusion versus multi chamber enlargement. This does not appear significantly changed in the interim. 2. Slight decrease in small bilateral pleural effusions. Aeration within the lower lung zones is slightly improved. 3. Continued vascular congestion, mildly decreased. Diffuse background interstitial edema also appears slightly decreased. 4. Atherosclerotic vascular disease of the aorta Electronically Signed   By: Donavan Foil M.D.   On: 10/11/2015 10:11        Scheduled Meds: . apixaban  2.5 mg Oral BID  . chlorhexidine  15 mL Mouth Rinse BID  . Chlorhexidine Gluconate Cloth  6 each Topical Once  . cholecalciferol  5,000 Units Oral Daily  . feeding supplement (PRO-STAT SUGAR FREE 64)  30 mL Oral BID  . furosemide  80 mg Intravenous BID  . lidocaine  1 patch Transdermal Q24H  . magnesium gluconate  500 mg Oral Daily  . mouth rinse  15 mL Mouth Rinse q12n4p  . metolazone  2.5 mg Oral Once per day on Mon Thu  . multivitamin with minerals  1 tablet Oral Daily  . mupirocin ointment   Nasal BID  . potassium chloride  20 mEq Oral Daily  . saccharomyces boulardii  250 mg Oral BID  . verapamil  120 mg Oral Daily   Continuous Infusions: . lactated ringers 10 mL/hr at 10/09/15  1450     LOS: 4 days    Time spent: 45 minutes.    Birdie Hopes, MD [facility] Pager [phone]  If 7PM-7AM, please contact night-coverage [email or web address] Password [facility] 10/12/2015, 11:36 AM

## 2015-10-12 NOTE — Progress Notes (Signed)
Care assumed from PM RN and pt assessed together with pt's daughter in room [place] MD discussed with 2/3 pt's daughter the importance of asking palliative care to offer insight to plan of care after dialoguing with RN that I am uncomfortable to offer PO to pt her her decreased LOC/ GCS, inability to follow commands, and low urine output after several doses of iv lasix (reported to me). Pt remained on the partial non- re breather due to an inability to wean. Pt's O2 sats dropped to low 80s both times she was attempted on [place] at 2/4/6 l/min.  Pt's O2 sats did improve to low 90s with the PNRB mask reapplied. Pt was medicated several times per family request that RN assess for pain/ aggitation with success in [place] her comfort AEB improvement in resp status and general comfort/ appearance

## 2015-10-12 NOTE — Discharge Instructions (Signed)
1. Change dressings as needed 2. May shower but keep incisions covered and dry 3. Take lovenox to prevent blood clots 4. Take stool softeners as needed 5. Take pain meds as needed  Apixaban oral tablets What is this medicine? APIXABAN (a PIX a ban) is an anticoagulant (blood thinner). It is used to lower the chance of stroke in people with a medical condition called atrial fibrillation. It is also used to treat or prevent blood clots in the lungs or in the veins. This medicine may be used for other purposes; ask your health care provider or pharmacist if you have questions. What should I tell my health care provider before I take this medicine? They need to know if you have any of these conditions: -bleeding disorders -bleeding in the brain -blood in your stools (black or tarry stools) or if you have blood in your vomit -history of stomach bleeding -kidney disease -liver disease -mechanical heart valve -an unusual or allergic reaction to apixaban, other medicines, foods, dyes, or preservatives -pregnant or trying to get pregnant -breast-feeding How should I use this medicine? Take this medicine by mouth with a glass of water. Follow the directions on the prescription label. You can take it with or without food. If it upsets your stomach, take it with food. Take your medicine at regular intervals. Do not take it more often than directed. Do not stop taking except on your doctor's advice. Stopping this medicine may increase your risk of a blot clot. Be sure to refill your prescription before you run out of medicine. Talk to your pediatrician regarding the use of this medicine in children. Special care may be needed. Overdosage: If you think you have taken too much of this medicine contact a poison control center or emergency room at once. NOTE: This medicine is only for you. Do not share this medicine with others. What if I miss a dose? If you miss a dose, take it as soon as you can.  If it is almost time for your next dose, take only that dose. Do not take double or extra doses. What may interact with this medicine? This medicine may interact with the following: -aspirin and aspirin-like medicines -certain medicines for fungal infections like ketoconazole and itraconazole -certain medicines for seizures like carbamazepine and phenytoin -certain medicines that treat or prevent blood clots like warfarin, enoxaparin, and dalteparin -clarithromycin -NSAIDs, medicines for pain and inflammation, like ibuprofen or naproxen -rifampin -ritonavir -St. John's wort This list may not describe all possible interactions. Give your health care provider a list of all the medicines, herbs, non-prescription drugs, or dietary supplements you use. Also tell them if you smoke, drink alcohol, or use illegal drugs. Some items may interact with your medicine. What should I watch for while using this medicine? Notify your doctor or health care professional and seek emergency treatment if you develop breathing problems; changes in vision; chest pain; severe, sudden headache; pain, swelling, warmth in the leg; trouble speaking; sudden numbness or weakness of the face, arm, or leg. These can be signs that your condition has gotten worse. If you are going to have surgery, tell your doctor or health care professional that you are taking this medicine. Tell your health care professional that you use this medicine before you have a spinal or epidural procedure. Sometimes people who take this medicine have bleeding problems around the spine when they have a spinal or epidural procedure. This bleeding is very rare. If you have a spinal  or epidural procedure while on this medicine, call your health care professional immediately if you have back pain, numbness or tingling (especially in your legs and feet), muscle weakness, paralysis, or loss of bladder or bowel control. Avoid sports and activities that might cause  injury while you are using this medicine. Severe falls or injuries can cause unseen bleeding. Be careful when using sharp tools or knives. Consider using an Copy. Take special care brushing or flossing your teeth. Report any injuries, bruising, or red spots on the skin to your doctor or health care professional. What side effects may I notice from receiving this medicine? Side effects that you should report to your doctor or health care professional as soon as possible: -allergic reactions like skin rash, itching or hives, swelling of the face, lips, or tongue -signs and symptoms of bleeding such as bloody or black, tarry stools; red or dark-brown urine; spitting up blood or brown material that looks like coffee grounds; red spots on the skin; unusual bruising or bleeding from the eye, gums, or nose This list may not describe all possible side effects. Call your doctor for medical advice about side effects. You may report side effects to FDA at 1-800-FDA-1088. Where should I keep my medicine? Keep out of the reach of children. Store at room temperature between 20 and 25 degrees C (68 and 77 degrees F). Throw away any unused medicine after the expiration date. NOTE: This sheet is a summary. It may not cover all possible information. If you have questions about this medicine, talk to your doctor, pharmacist, or health care provider.    2016, Elsevier/Gold Standard. (2012-09-16 11:59:24)

## 2015-10-12 NOTE — Progress Notes (Addendum)
Patient Name: [name] Date of Encounter: 10/12/2015  Principal Problem:   Closed left hip fracture [facility]) Active Problems:   Pacemaker   Paroxysmal atrial fibrillation (HCC)   Hypokalemia   Anemia   Fall   Acute and chronic respiratory failure with hypoxia ([place])   Length of Stay: 4  SUBJECTIVE  She is delirious, moderately sedated, pulling off O2 mask. Very restless. O2 sat 91-92% with mask on, drops to low 80s with nasal cannula. Hemoglobin has dropped further. She was already moderately anemic preop. No overt bleeding. UO is poor despite furosemide. No PO intake. CXR shows congestion.  CURRENT MEDS . apixaban  2.5 mg Oral BID  . chlorhexidine  15 mL Mouth Rinse BID  . Chlorhexidine Gluconate Cloth  6 each Topical Once  . cholecalciferol  5,000 Units Oral Daily  . feeding supplement (PRO-STAT SUGAR FREE 64)  30 mL Oral BID  . furosemide  80 mg Intravenous TID  . lidocaine  1 patch Transdermal Q24H  . magnesium gluconate  500 mg Oral Daily  . mouth rinse  15 mL Mouth Rinse q12n4p  . [START ON 10/13/2015] metolazone  2.5 mg Oral Daily  . multivitamin with minerals  1 tablet Oral Daily  . mupirocin ointment   Nasal BID  . potassium chloride  20 mEq Oral Daily  . saccharomyces boulardii  250 mg Oral BID  . verapamil  120 mg Oral Daily    OBJECTIVE   Intake/Output Summary (Last 24 hours) at 10/12/15 1702 Last data filed at 10/12/15 1600  Gross per 24 hour  Intake                0 ml  Output              350 ml  Net             -350 ml   Filed Weights   10/08/15 0600 10/09/15 0629  Weight: 70.4 kg (155 lb 1.6 oz) 71.7 kg (158 lb)    PHYSICAL EXAM Vitals:   10/12/15 1300 10/12/15 1400 10/12/15 1500 10/12/15 1600  BP:      Pulse: (!) 59 (!) 59 (!) 59 (!) 59  Resp: 14 17 15 14   Temp:    97.3 F (36.3 C)  TempSrc:    Axillary  SpO2: 98% 93% 97% 97%  Weight:      Height:       General: Alert, oriented x3, no distress Head: no evidence of trauma,  PERRL, EOMI, no exophtalmos or lid lag, no myxedema, no xanthelasma; normal ears, nose and oropharynx Neck: normal jugular venous pulsations and no hepatojugular reflux; brisk carotid pulses without delay and no carotid bruits Chest: clear to auscultation, no signs of consolidation by percussion or palpation, normal fremitus, symmetrical and full respiratory excursions Cardiovascular: normal position and quality of the apical impulse, regular rhythm, normal first and paradoxically split second heart sounds, no rubs or gallops, 3/6 aortic ejection murmur Abdomen: no tenderness or distention, no masses by palpation, no abnormal pulsatility or arterial bruits, normal bowel sounds, no hepatosplenomegaly Extremities: no clubbing, cyanosis or edema; 2+ radial, ulnar and brachial pulses bilaterally; 2+ right femoral, posterior tibial and dorsalis pedis pulses; 2+ left femoral, posterior tibial and dorsalis pedis pulses; no subclavian or femoral bruits Neurological: grossly nonfocal  LABS  CBC  Recent Labs  10/11/15 0335 10/12/15 0306  WBC 6.3 7.4  HGB 8.7* 7.7*  HCT 29.3* 27.0*  MCV 73.6* 74.0*  PLT 241  [id number]   Basic Metabolic Panel  Recent Labs  10/10/15 0517 10/11/15 0335  NA 133* 132*  K 4.2 4.4  CL 97* 94*  CO2 27 27  GLUCOSE 118* 94  BUN 23* 30*  CREATININE 1.68* 1.79*  CALCIUM 8.6* 8.8*  MG  --  1.9   Radiology Studies Imaging results have been reviewed and Dg Chest Port 1 View  Result Date: 10/11/2015 CLINICAL DATA:  CHF EXAM: PORTABLE CHEST 1 VIEW COMPARISON:  10/10/2015, 10/08/2015 FINDINGS: Multiple support leads over the patient obscure the chest. Right-sided pacing device is similar compared to previous. Marked enlargement of the cardiomediastinal silhouette with globular cardiac configuration. Small bilateral pleural effusions are slightly decreased. Persistent dense airspace consolidation within the bilateral lung bases. Diffuse interstitial edema appears slightly  decreased. Atherosclerosis of the aorta. No pneumothorax. IMPRESSION: 1. Marked enlargement of the cardiomediastinal silhouette with globular cardiac configuration, pericardial effusion versus multi chamber enlargement. This does not appear significantly changed in the interim. 2. Slight decrease in small bilateral pleural effusions. Aeration within the lower lung zones is slightly improved. 3. Continued vascular congestion, mildly decreased. Diffuse background interstitial edema also appears slightly decreased. 4. Atherosclerotic vascular disease of the aorta Electronically Signed   By: Donavan Foil M.D.   On: 10/11/2015 10:11    TELE AFib and V pacing  ASSESSMENT AND PLAN    1. Acute on chronic diastolic heart failure: will try diuretics together with transfusion of one unit of PRBCs 2. AFib, permanent: now on Eliquis. Surprisingly has 100% V pacing despite clear distress and CHf and anemia. Will stop the verapamil. She may do better with a faster heart rate and may have more effective LV performance with native AV conduction. 3. Pericardial effusion: This is located exclusively posterior to the heart and is relatively small in overall volume. It does not appear to be of any hemodynamic consequence at this time. 4. AS: Mild and unlikely to be clinically relevant in the near future. 5. TR: She has severe tricuspid regurgitation and at least moderate pulmonary artery hypertension. Most likely this is related to chronic diastolic heart failure, but may make her sensitive to aggressive volume shifts (for example hypotension with rapid  Diuresis). 6. Pacemaker: Normal device function at recent check. She is not pacemaker dependent.  Prognosis is very guarded. Now has DNR status.   Sanda Klein, MD, [facility] [phone] office [phone] pager 10/12/2015 5:02 PM

## 2015-10-13 ENCOUNTER — Inpatient Hospital Stay (HOSPITAL_COMMUNITY): Payer: Medicare Other

## 2015-10-13 DIAGNOSIS — D649 Anemia, unspecified: Secondary | ICD-10-CM

## 2015-10-13 DIAGNOSIS — S72002A Fracture of unspecified part of neck of left femur, initial encounter for closed fracture: Secondary | ICD-10-CM

## 2015-10-13 DIAGNOSIS — S72002D Fracture of unspecified part of neck of left femur, subsequent encounter for closed fracture with routine healing: Secondary | ICD-10-CM

## 2015-10-13 LAB — TYPE AND SCREEN
ABO/RH(D): AB POS
Antibody Screen: NEGATIVE
Unit division: 0

## 2015-10-13 LAB — BASIC METABOLIC PANEL
ANION GAP: 13 (ref 5–15)
BUN: 51 mg/dL — ABNORMAL HIGH (ref 6–20)
CO2: 27 mmol/L (ref 22–32)
Calcium: 8.9 mg/dL (ref 8.9–10.3)
Chloride: 95 mmol/L — ABNORMAL LOW (ref 101–111)
Creatinine, Ser: 2.2 mg/dL — ABNORMAL HIGH (ref 0.44–1.00)
GFR calc Af Amer: 21 mL/min — ABNORMAL LOW (ref >60)
GFR, EST NON AFRICAN AMERICAN: 19 mL/min — AB (ref >60)
Glucose, Bld: 86 mg/dL (ref 65–99)
POTASSIUM: 4.9 mmol/L (ref 3.5–5.1)
SODIUM: 135 mmol/L (ref 135–145)

## 2015-10-13 LAB — CBC
HEMATOCRIT: 31.7 % — AB (ref 36.0–46.0)
HEMOGLOBIN: 9.5 g/dL — AB (ref 12.0–15.0)
MCH: 22.2 pg — ABNORMAL LOW (ref 26.0–34.0)
MCHC: 30 g/dL (ref 30.0–36.0)
MCV: 74.2 fL — ABNORMAL LOW (ref 78.0–100.0)
Platelets: 349 10*3/uL (ref 150–400)
RBC: 4.27 MIL/uL (ref 3.87–5.11)
RDW: 17.3 % — ABNORMAL HIGH (ref 11.5–15.5)
WBC: 6.4 10*3/uL (ref 4.0–10.5)

## 2015-10-13 NOTE — Progress Notes (Signed)
PROGRESS NOTE  [name]  [id number] DOB: October 27, 1923  DOA: 10/08/2015 PCP: [name], MD  Primary Cardiologist: Dr. Dani Gobble Croitoru  Subjective: Respond to visual stimuli by opening her eyes and then drifts back into sleep. Seen with her POA daughter [name] at bedside, await palliative medicine team recommendation.  Brief Narrative:  82 year old female with a PMH of chronic A. fib on Coumadin, chronic diastolic CHF, 2-D echo [id number] with LVEF 55-60 percent, HTN, pericardial effusion status post pericardial window 01/16/15, stroke, tachybradycardia syndrome status post pacemaker, anemia, status post recent hospitalization 09/24/15-09/30/15 for urgent sigmoid colectomy with Hartmann's procedure and small bowel resection on 05/31/15 following which transferred to inpatient rehabilitation and then transitioned home, awaiting colostomy reversal/repair of parastomal hernia, presented to ED on 10/08/15 following a mechanical fall at home and sustained left hip pain, right wrist and left forearm pain. Workup revealed left intertrochanteric hip fracture, potassium 2.2. Orthopedics/Dr. Erlinda Hong was consulted and plan for surgery on 10/09/15 after medical optimization.   Assessment & Plan:   Principal Problem:   Closed left hip fracture [facility]) Active Problems:   Pacemaker   Paroxysmal atrial fibrillation (HCC)   Hypokalemia   Anemia   Fall   Acute and chronic respiratory failure with hypoxia (HCC)   CHF (congestive heart failure) (HCC)   Left intertrochanteric hip fracture -Left intratrochanteric hip fracture after mechanical fall, orthopedics consulted status post repair on 10/09/2015 -Coumadin held, INR was 1.5 for the time of surgery. -Cardiology was consulted prior to surgery because of fragility of the patient and previous cardiovascular events. -Post surgery developed mild fluid overload, treated with aggressive diuresis with suboptimal response.  Acute respiratory failure with  hypoxia -After the surgery patient spent the night in the step down and required BiPAP and nonrebreather mask transiently -Still shuffling between BiPAP and her breather mask secondary to pulmonary edema. -Continue IV Lasix not having much of urine output. -Not responding very well to diuresis, palliative care consulted  Acute on chronic diastolic CHF -She appeared euvolemic on admission, he developed acute respiratory failure with hypoxia, CXR showed more congestion. -Elevated BNP, CXR consistent with acute CHF as well as lower extremity edema. -Started on high-dose Lasix, creatinine continues to worsen.  Chronic atrial fibrillation/tachybradycardia syndrome status post pacemaker - CHADS2Vasc 7. INR therapeutic at 2.9 on admission. - Rate is controlled with verapamil and digoxin. Patient was on warfarin for anticoagulation. -Cardiology recommended Eliquis 2.5 mg for anticoagulation when it's okay with general surgery.  Acute on stage III chronic kidney disease - Creatinine at recent discharge 09/30/15:1.2. Now presented with creatinine of 1.5.  - Briefly hydrated with IV fluids. Chest x-ray suggests pulmonary congestion. DC IV fluids.  Hypokalemia - Potassium 2.2 on admission. Mg: 1.7. Replacing aggressively. Follow BMP and magnesium. - Potassium improved to 3.5 with multiple rounds of oral and IV potassium. Repeat oral potassium and check BMP in a.m.  Essential hypertension - Controlled.  Anemia -Hemoglobin dropped to 7.7, I'm not sure with the fluid overload she will benefit that much from transfusion. -Monitor closely, if dropped less than 7.5 might need transfusion.   DVT prophylaxis: SCD's Code Status: Full Family Communication: Daughters Veterinary surgeon  Disposition Plan: TBA   Consultants:   Orthopedics.  Cardiology  Procedures:   Foley  Antimicrobials:   None     Objective:  Vitals:   10/13/15 0202 10/13/15 0440 10/13/15 0800 10/13/15 1100  BP: (!) 103/52 (!)  115/52 (!) 120/51 (!) 166/43  Pulse: 60 60 (!) 59 (!) 59  Resp: 12 16 16 14   Temp: 98 F (36.7 C) 97.4 F (36.3 C) 97.4 F (36.3 C) 97.5 F (36.4 C)  TempSrc:  Axillary Axillary Axillary  SpO2: 99% 95% 91% 95%  Weight:      Height:        Intake/Output Summary (Last 24 hours) at 10/13/15 1159 Last data filed at 10/13/15 0440  Gross per 24 hour  Intake              335 ml  Output              425 ml  Net              -90 ml   Filed Weights   10/08/15 0600 10/09/15 0629  Weight: 70.4 kg (155 lb 1.6 oz) 71.7 kg (158 lb)    Examination:  General exam: Pleasant elderly frail female lying comfortably supine in bed.  Respiratory system: Clear to auscultation. Respiratory effort normal. Cardiovascular system: S1 & S2 heard, Irregularly irregular. No JVD, murmurs, rubs, gallops or clicks. No pedal edema. V paced.  Gastrointestinal system: Abdomen is nondistended, soft and nontender. No organomegaly or masses felt. Normal bowel sounds heard. Central nervous system: Alert and oriented. No focal neurological deficits. Extremities: Symmetric 5 x 5 power except left lower extremity where movements are restricted due to pain.. Skin: No rashes, lesions or ulcers Psychiatry: Judgement and insight appear normal. Mood & affect appropriate.     Data Reviewed: I have personally reviewed following labs and imaging studies  CBC:  Recent Labs Lab 10/08/15 0230  10/09/15 0754 10/10/15 0517 10/11/15 0335 10/12/15 0306 10/13/15 0738  WBC 5.7  --  7.9 8.0 6.3 7.4 6.4  NEUTROABS 4.0  --   --   --   --   --   --   HGB 9.4*  < > 8.3* 8.2* 8.7* 7.7* 9.5*  HCT 30.6*  < > 29.0* 28.1* 29.3* 27.0* 31.7*  MCV 70.3*  --  72.9* 73.9* 73.6* 74.0* 74.2*  PLT 253  --  225 214 241 289 349  < > = values in this interval not displayed. Basic Metabolic Panel:  Recent Labs Lab 10/08/15 0334 10/08/15 1956 10/09/15 0754 10/10/15 0517 10/11/15 0335 10/13/15 0738  NA  --  136 135 133* 132* 135  K  2.2* 2.6* 3.5 4.2 4.4 4.9  CL  --  93* 95* 97* 94* 95*  CO2  --  33* 32 27 27 27   GLUCOSE  --  160* 110* 118* 94 86  BUN  --  18 18 23* 30* 51*  CREATININE  --  1.39* 1.35* 1.68* 1.79* 2.20*  CALCIUM  --  8.9 8.9 8.6* 8.8* 8.9  MG 1.7 1.9  --   --  1.9  --    GFR: Estimated Creatinine Clearance: 16.5 mL/min (by C-G formula based on SCr of 2.2 mg/dL (H)). Liver Function Tests: No results for input(s): AST, ALT, ALKPHOS, BILITOT, PROT, ALBUMIN in the last 168 hours. No results for input(s): LIPASE, AMYLASE in the last 168 hours. No results for input(s): AMMONIA in the last 168 hours. Coagulation Profile:  Recent Labs Lab 10/08/15 0230 10/09/15 0754  INR 2.90 1.51   Cardiac Enzymes: No results for input(s): CKTOTAL, CKMB, CKMBINDEX, TROPONINI in the last 168 hours. BNP (last 3 results) No results for input(s): PROBNP in the last 8760 hours. HbA1C: No results for input(s): HGBA1C in the last 72 hours. CBG: No results for input(s): GLUCAP in  the last 168 hours. Lipid Profile: No results for input(s): CHOL, HDL, LDLCALC, TRIG, CHOLHDL, LDLDIRECT in the last 72 hours. Thyroid Function Tests: No results for input(s): TSH, T4TOTAL, FREET4, T3FREE, THYROIDAB in the last 72 hours. Anemia Panel: No results for input(s): VITAMINB12, FOLATE, FERRITIN, TIBC, IRON, RETICCTPCT in the last 72 hours.  Sepsis Labs: No results for input(s): PROCALCITON, LATICACIDVEN in the last 168 hours.  Recent Results (from the past 240 hour(s))  MRSA PCR Screening     Status: Abnormal   Collection Time: 10/08/15  3:59 PM  Result Value Ref Range Status   MRSA by PCR POSITIVE (A) NEGATIVE Final    Comment:        The GeneXpert MRSA Assay (FDA approved for NASAL specimens only), is one component of a comprehensive MRSA colonization surveillance program. It is not intended to diagnose MRSA infection nor to guide or monitor treatment for MRSA infections. CRITICAL RESULT CALLED TO, READ BACK BY AND  VERIFIED WITH: Stasia Cavalier, RN AT [place] ON 10/08/15 BY C. Havlicek, MLT.          Radiology Studies: Dg Chest Port 1 View  Result Date: 10/13/2015 CLINICAL DATA:  CHF. EXAM: PORTABLE CHEST 1 VIEW COMPARISON:  10/11/2015 FINDINGS: Marked cardiomegaly again noted with pulmonary vascular congestion. Bilateral lower lung atelectasis/ consolidation again noted, increased on the left. A right-sided pacemaker is again identified. There is no evidence of pneumothorax or other significant change. IMPRESSION: Bilateral lower lung atelectasis/ consolidation, increased on the left. Cardiomegaly and pulmonary vascular congestion again noted. Electronically Signed   By: Margarette Canada M.D.   On: 10/13/2015 09:57        Scheduled Meds: . apixaban  2.5 mg Oral BID  . chlorhexidine  15 mL Mouth Rinse BID  . Chlorhexidine Gluconate Cloth  6 each Topical Once  . cholecalciferol  5,000 Units Oral Daily  . feeding supplement (PRO-STAT SUGAR FREE 64)  30 mL Oral BID  . furosemide  80 mg Intravenous TID  . lidocaine  1 patch Transdermal Q24H  . magnesium gluconate  500 mg Oral Daily  . mouth rinse  15 mL Mouth Rinse q12n4p  . metolazone  2.5 mg Oral Daily  . metolazone  2.5 mg Oral Once  . multivitamin with minerals  1 tablet Oral Daily  . mupirocin ointment   Nasal BID  . potassium chloride  20 mEq Oral Daily  . saccharomyces boulardii  250 mg Oral BID   Continuous Infusions:     LOS: 5 days    Time spent: 45 minutes.    Birdie Hopes, MD [facility] Pager [phone]  If 7PM-7AM, please contact night-coverage [email or web address] Password [email or web address] 10/13/2015, 11:59 AM

## 2015-10-13 NOTE — Progress Notes (Signed)
Palliative Medicine Team consult was received. We have scheduled a meeting with family tomorrow at [place].  If there are urgent needs or questions please call [phone]. Thank you for consulting out team to assist with this patients care.  Micheline Rough, MD [place] Team [phone]

## 2015-10-13 NOTE — NC FL2 (Signed)
[place] LEVEL OF CARE SCREENING TOOL     IDENTIFICATION  Patient Name: [name] Birthdate: September 23, 1923 Sex: female Admission Date (Current Location): 10/08/2015  [facility] and [place] Number:  Herbalist and Address:  [facility], [place] [address], [place], [place] [address]      Provider Number: [id number]  Attending Physician Name and Address:  Verlee Monte, MD  Relative Name and Phone Number:       Current Level of Care: Hospital Recommended Level of Care: [place] Prior Approval Number:    Date Approved/Denied:   PASRR Number:  ([id number])  Discharge Plan: SNF    Current Diagnoses: Patient Active Problem List   Diagnosis Date Noted  . CHF (congestive heart failure) ([place])   . Acute and chronic respiratory failure with hypoxia ([place]) 10/10/2015  . Hip fracture, left ([place]) 10/08/2015  . Closed left hip fracture ([place]) 10/08/2015  . Anemia 10/08/2015  . Fall 10/08/2015  . Colostomy in place [facility]) 09/25/2015  . SBO (small bowel obstruction) ([place]) 09/24/2015  . Gastrointestinal hemorrhage with melena   . Subtherapeutic anticoagulation   . Severe malnutrition ([place]) 06/20/2015  . Acute deep vein thrombosis (DVT) of brachial vein of right upper extremity ([place])   . Localized edema   . Hypokalemia   . Hypomagnesemia   . Debilitated 06/14/2015  . History of creation of ostomy 06/14/2015  . Abdominal pain   . Chronic diastolic congestive heart failure ([place])   . History of CVA with residual deficit   . Respiratory depression   . Stool culture positive for Clostridium difficile   . On total parenteral nutrition (TPN)   . Urinary retention   . Leukocytosis   . Tachycardia   . Normocytic anemia   . Acute kidney injury superimposed on chronic kidney disease ([place])   . Status post partial colectomy   . Ileus, postoperative   . Postoperative pain   . Diverticulitis of large intestine with perforation  and abscess without bleeding   . CKD (chronic kidney disease) stage 3, GFR 30-59 ml/min 05/26/2015  . HOH (hard of hearing) 05/26/2015  . Protein-calorie malnutrition, moderate ([place]) 05/26/2015  . Diverticulitis of intestine with perforation and abscess 05/25/2015  . Diverticulosis   . Chronic thoracic back pain   . Acute on chronic diastolic heart failure ([place])   . Perforation of intestine due to diverticulitis of gastrointestinal tract ([place])   . SOB (shortness of breath) 05/01/2015  . Supratherapeutic INR   . Paroxysmal atrial fibrillation (HCC)   . Essential hypertension, benign   . Left sided abdominal pain   . Diverticulitis 03/08/2015  . Obesity   . Chronic atrial fibrillation ([place])   . Upper airway cough syndrome   . Dyspnea 05/22/2014  . Tachycardia-bradycardia syndrome with symptomatic bradycardia 01/03/2014  . History of CVA (cerebrovascular accident) 10/25/2013  . Diabetes ([place]) 08/03/2013  . Myofacial muscle pain 10/16/2011  . Back pain, Left Flank 07/01/2011  . Cerebral embolism with cerebral infarction ([place]) 06/12/2011  . CVA (cerebral infarction) 05/20/2011  . Weakness of left side of body 05/16/2011  . Elevated digoxin level 05/16/2011  . Pacemaker 05/21/2010  . Edema 04/24/2010  . Pericardial effusion, Large   . Chronic anticoagulation - Coumadin, CHADS2VASC=7   . HTN (hypertension)   . Permanent atrial fibrillation ([place]) 04/14/2010    Orientation RESPIRATION BLADDER Height & Weight     Self, Time, Situation, Place  Normal Incontinent Weight: 158 lb (  71.7 kg) (bedscale) Height:  5\' 4"  (162.6 cm)  BEHAVIORAL SYMPTOMS/MOOD NEUROLOGICAL BOWEL NUTRITION STATUS   (None)  (Stroke History) Continent  (Regular)  AMBULATORY STATUS COMMUNICATION OF NEEDS Skin   Extensive Assist Verbally Surgical wounds                       Personal Care Assistance Level of Assistance  Bathing, Feeding, Dressing Bathing Assistance: Limited assistance Feeding assistance:  Independent Dressing Assistance: Limited assistance     Functional Limitations Info  Sight, Hearing, Speech Sight Info: Adequate Hearing Info: Adequate Speech Info: Adequate    SPECIAL CARE FACTORS FREQUENCY  PT (By licensed PT), OT (By licensed OT)     PT Frequency:  (5x/week) OT Frequency:  (5x/week)            Contractures Contractures Info: Not present    Additional Factors Info  Code Status, Allergies Code Status Info:  (DNR) Allergies Info:  (Anti-inflammatory Enzyme Nutritional Supplements, Iodinated Diagnostic Agents, Spironolactone, Arthrotec Diclofenac-misoprostol, Biaxin Clarithromycin, Plavix Clopidogrel Bisulfate)           Current Medications (10/13/2015):  This is the current hospital active medication list Current Facility-Administered Medications  Medication Dose Route Frequency Provider Last Rate Last Dose  . acetaminophen (TYLENOL) tablet 650 mg  650 mg Oral Q6H PRN Naiping Ephriam Jenkins, MD       Or  . acetaminophen (TYLENOL) suppository 650 mg  650 mg Rectal Q6H PRN Leandrew Koyanagi, MD      . ALPRAZolam Duanne Moron) tablet 0.25 mg  0.25 mg Oral TID PRN Norval Morton, MD   0.25 mg at 10/10/15 0331  . alum & mag hydroxide-simeth (MAALOX/MYLANTA) 200-200-20 MG/5ML suspension 30 mL  30 mL Oral Q4H PRN Naiping Ephriam Jenkins, MD      . apixaban Arne Cleveland) tablet 2.5 mg  2.5 mg Oral BID Verlee Monte, MD   2.5 mg at 10/11/15 1044  . chlorhexidine (PERIDEX) 0.12 % solution 15 mL  15 mL Mouth Rinse BID Verlee Monte, MD   15 mL at 10/13/15 1200  . Chlorhexidine Gluconate Cloth 2 % PADS 6 each  6 each Topical Once Donnie [place]a, MD      . cholecalciferol (VITAMIN D) tablet 5,000 Units  5,000 Units Oral Daily Naiping Ephriam Jenkins, MD   5,000 Units at 10/10/15 1003  . cyclobenzaprine (FLEXERIL) tablet 5 mg  5 mg Oral TID PRN Norval Morton, MD      . feeding supplement (PRO-STAT SUGAR FREE 64) liquid 30 mL  30 mL Oral BID Naiping Ephriam Jenkins, MD   30 mL at 10/10/15 1007  . HYDROcodone-acetaminophen  (NORCO/VICODIN) 5-325 MG per tablet 1-2 tablet  1-2 tablet Oral Q6H PRN Leandrew Koyanagi, MD   1 tablet at 10/10/15 1817  . HYDROmorphone (DILAUDID) injection 0.5 mg  0.5 mg Intravenous Q2H PRN Norval Morton, MD   0.5 mg at 10/13/15 0812  . lidocaine (LIDODERM) 5 % 1 patch  1 patch Transdermal Q24H Norval Morton, MD   1 patch at 10/13/15 0452  . LORazepam (ATIVAN) injection 0.5 mg  0.5 mg Intravenous Q6H PRN Rhetta Mura Schorr, NP   0.5 mg at 10/13/15 0452  . magnesium gluconate (MAGONATE) tablet 500 mg  500 mg Oral Daily Norval Morton, MD   500 mg at 10/10/15 1007  . MEDLINE mouth rinse  15 mL Mouth Rinse q12n4p Verlee Monte, MD   15 mL at 10/13/15 1210  . menthol-cetylpyridinium (CEPACOL)  lozenge 3 mg  1 lozenge Oral PRN Naiping Ephriam Jenkins, MD       Or  . phenol (CHLORASEPTIC) mouth spray 1 spray  1 spray Mouth/Throat PRN Naiping Ephriam Jenkins, MD      . methocarbamol (ROBAXIN) tablet 500 mg  500 mg Oral Q6H PRN Leandrew Koyanagi, MD   500 mg at 10/10/15 1817   Or  . methocarbamol (ROBAXIN) 500 mg in dextrose 5 % 50 mL IVPB  500 mg Intravenous Q6H PRN Leandrew Koyanagi, MD   500 mg at 10/09/15 1943  . metoCLOPramide (REGLAN) tablet 5-10 mg  5-10 mg Oral Q8H PRN Naiping Ephriam Jenkins, MD       Or  . metoCLOPramide (REGLAN) injection 5-10 mg  5-10 mg Intravenous Q8H PRN Naiping Ephriam Jenkins, MD      . metolazone (ZAROXOLYN) tablet 2.5 mg  2.5 mg Oral Daily Verlee Monte, MD      . metolazone (ZAROXOLYN) tablet 2.5 mg  2.5 mg Oral Once Mihai Croitoru, MD      . morphine 2 MG/ML injection 0.5 mg  0.5 mg Intravenous Q2H PRN Leandrew Koyanagi, MD   0.5 mg at 10/13/15 0224  . multivitamin with minerals tablet 1 tablet  1 tablet Oral Daily Naiping Ephriam Jenkins, MD   1 tablet at 10/10/15 1003  . mupirocin ointment (BACTROBAN) 2 %   Nasal BID Verlee Monte, MD      . ondansetron (ZOFRAN) tablet 4 mg  4 mg Oral Q6H PRN Naiping Ephriam Jenkins, MD       Or  . ondansetron [facility]) injection 4 mg  4 mg Intravenous Q6H PRN Naiping Ephriam Jenkins, MD      . oxyCODONE (Oxy  IR/ROXICODONE) immediate release tablet 5-10 mg  5-10 mg Oral Q4H PRN Leandrew Koyanagi, MD   5 mg at 10/11/15 1115  . potassium chloride (K-DUR) CR tablet 20 mEq  20 mEq Oral Daily Norval Morton, MD   20 mEq at 10/10/15 1003  . saccharomyces boulardii (FLORASTOR) capsule 250 mg  250 mg Oral BID Norval Morton, MD   250 mg at 10/10/15 1001     Discharge Medications: Please see discharge summary for a list of discharge medications.  Relevant Imaging Results:  Relevant Lab Results:   Additional Information SS#: [id number]  Junie Spencer, LCSW

## 2015-10-13 NOTE — Progress Notes (Signed)
Pt continues to remove non rebreather and O2 sats drop to 78%, mit placed on right hand but removed by daughter while she is at bedside. Consuelo Pandy RN

## 2015-10-13 NOTE — Progress Notes (Signed)
Pt is too lethargic to take any medications PO or any food or water at this time. Pt will not follow commands or answer any questions. Pt will not open her eyes. Dr Hartford Poli at bedside earlier and aware that pt is very sleepy. Consuelo Pandy RN

## 2015-10-13 NOTE — Progress Notes (Signed)
Notified Dr Hartford Poli that pt is still too lethargic to take medications or eat. Consuelo Pandy RN

## 2015-10-13 NOTE — Progress Notes (Signed)
Patient Name: [name] Date of Encounter: 10/13/2015  Principal Problem:   Closed left hip fracture [facility]) Active Problems:   Pacemaker   Paroxysmal atrial fibrillation (HCC)   Hypokalemia   Anemia   Fall   Acute and chronic respiratory failure with hypoxia (HCC)   CHF (congestive heart failure) ([place])   Length of Stay: 5  SUBJECTIVE  Lethargic, hard to awaken. O2 sat 98% on NRB mask, promptly drops when removed. UO very poor and creatinine has increased substantially, now 2.2 Hgb increased to 9.7 after transfusion.  CURRENT MEDS . apixaban  2.5 mg Oral BID  . chlorhexidine  15 mL Mouth Rinse BID  . Chlorhexidine Gluconate Cloth  6 each Topical Once  . cholecalciferol  5,000 Units Oral Daily  . feeding supplement (PRO-STAT SUGAR FREE 64)  30 mL Oral BID  . lidocaine  1 patch Transdermal Q24H  . magnesium gluconate  500 mg Oral Daily  . mouth rinse  15 mL Mouth Rinse q12n4p  . metolazone  2.5 mg Oral Daily  . metolazone  2.5 mg Oral Once  . multivitamin with minerals  1 tablet Oral Daily  . mupirocin ointment   Nasal BID  . potassium chloride  20 mEq Oral Daily  . saccharomyces boulardii  250 mg Oral BID    OBJECTIVE   Intake/Output Summary (Last 24 hours) at 10/13/15 1426 Last data filed at 10/13/15 0440  Gross per 24 hour  Intake              335 ml  Output              425 ml  Net              -90 ml   Filed Weights   10/08/15 0600 10/09/15 0629  Weight: 70.4 kg (155 lb 1.6 oz) 71.7 kg (158 lb)    PHYSICAL EXAM Vitals:   10/13/15 0202 10/13/15 0440 10/13/15 0800 10/13/15 1100  BP: (!) 103/52 (!) 115/52 (!) 120/51 (!) 166/43  Pulse: 60 60 (!) 59 (!) 59  Resp: 12 16 16 14   Temp: 98 F (36.7 C) 97.4 F (36.3 C) 97.4 F (36.3 C) 97.5 F (36.4 C)  TempSrc:  Axillary Axillary Axillary  SpO2: 99% 95% 91% 95%  Weight:      Height:       General: Asleep, difficult to arouse Head: no evidence of trauma, PERRL, EOMI, no exophtalmos or lid lag, no  myxedema, no xanthelasma; normal ears, nose and oropharynx Neck: 6-8 cm jugular venous pulsations and prominent V waves; brisk carotid pulses without delay and no carotid bruits Chest: clear to auscultation anteriorly Cardiovascular: normal position and quality of the apical impulse, regular rhythm, normal first and second heart sounds, no rubs or gallops, 3/6 holosystolic LLSB murmur Abdomen: no tenderness or distention, no masses by palpation, no abnormal pulsatility or arterial bruits, normal bowel sounds, no hepatosplenomegaly Extremities: no clubbing, cyanosis or edema; SCDs on Neurological: grossly nonfocal  LABS  CBC  Recent Labs  10/12/15 0306 10/13/15 0738  WBC 7.4 6.4  HGB 7.7* 9.5*  HCT 27.0* 31.7*  MCV 74.0* 74.2*  PLT 289 0000000   Basic Metabolic Panel  Recent Labs  10/11/15 0335 10/13/15 0738  NA 132* 135  K 4.4 4.9  CL 94* 95*  CO2 27 27  GLUCOSE 94 86  BUN 30* 51*  CREATININE 1.79* 2.20*  CALCIUM 8.8* 8.9  MG 1.9  --    Liver Function  Tests  Radiology Studies Imaging results have been reviewed and Dg Chest Port 1 View  Result Date: 10/13/2015 CLINICAL DATA:  CHF. EXAM: PORTABLE CHEST 1 VIEW COMPARISON:  10/11/2015 FINDINGS: Marked cardiomegaly again noted with pulmonary vascular congestion. Bilateral lower lung atelectasis/ consolidation again noted, increased on the left. A right-sided pacemaker is again identified. There is no evidence of pneumothorax or other significant change. IMPRESSION: Bilateral lower lung atelectasis/ consolidation, increased on the left. Cardiomegaly and pulmonary vascular congestion again noted. Electronically Signed   By: Margarette Canada M.D.   On: 10/13/2015 09:57    TELE 100% V paced, background AFib    ASSESSMENT AND PLAN   1. Acute on chronic diastolic heart failure: meager response to high dose combination loop and thiazide diuretics. Stopped diuretics today. Her intake is essentially zero. 2. Acute renal failure,  oliguric:  Poor prognosis, I doubt she is a dialysis candidate. Eliquis is already low dose 3. AFib, permanent: now on Eliquis. Still 100% V pacing despite stopping verapamil. Really no true clinical data at Creat Cl under 15 mL/min for Eliquis (or any other DOAC). May soon have to discuss stopping it (although she had an embolic stroke when warfarin was subtherapeutic in the past). 4. Pericardial effusion: This is located exclusively posterior to the heart and is relatively small in overall volume. It does not appear to be of any hemodynamic consequence at this time. 5. Anemia: improved after transfusion 6. AS: Mild and unlikely to be clinically relevant in the near future. 7. TR: She has severe tricuspid regurgitation and at least moderate pulmonary artery hypertension. Most likely this is related to chronic diastolic heart failure, but may make her sensitive to aggressive volume shifts (for example hypotension with rapid diuresis). 8. Pacemaker: Normal device function at recent check. She was not pacemaker dependent at last check.  Overall prognosis seems very poor. DNR status. Palliative care consultation.   Sanda Klein, MD, [facility] [phone] office [phone] pager 10/13/2015 2:26 PM

## 2015-10-14 DIAGNOSIS — Z7189 Other specified counseling: Secondary | ICD-10-CM

## 2015-10-14 DIAGNOSIS — M79602 Pain in left arm: Secondary | ICD-10-CM

## 2015-10-14 DIAGNOSIS — M79605 Pain in left leg: Secondary | ICD-10-CM

## 2015-10-14 DIAGNOSIS — N179 Acute kidney failure, unspecified: Secondary | ICD-10-CM

## 2015-10-14 DIAGNOSIS — R262 Difficulty in walking, not elsewhere classified: Secondary | ICD-10-CM

## 2015-10-14 DIAGNOSIS — D62 Acute posthemorrhagic anemia: Secondary | ICD-10-CM

## 2015-10-14 DIAGNOSIS — Z515 Encounter for palliative care: Secondary | ICD-10-CM

## 2015-10-14 DIAGNOSIS — M79606 Pain in leg, unspecified: Secondary | ICD-10-CM

## 2015-10-14 LAB — CBC
HCT: 30.6 % — ABNORMAL LOW (ref 36.0–46.0)
HEMOGLOBIN: 8.9 g/dL — AB (ref 12.0–15.0)
MCH: 21.8 pg — AB (ref 26.0–34.0)
MCHC: 29.1 g/dL — AB (ref 30.0–36.0)
MCV: 74.8 fL — AB (ref 78.0–100.0)
PLATELETS: 331 10*3/uL (ref 150–400)
RBC: 4.09 MIL/uL (ref 3.87–5.11)
RDW: 17.4 % — ABNORMAL HIGH (ref 11.5–15.5)
WBC: 5.8 10*3/uL (ref 4.0–10.5)

## 2015-10-14 LAB — BASIC METABOLIC PANEL
Anion gap: 8 (ref 5–15)
BUN: 61 mg/dL — ABNORMAL HIGH (ref 6–20)
CALCIUM: 8.5 mg/dL — AB (ref 8.9–10.3)
CHLORIDE: 99 mmol/L — AB (ref 101–111)
CO2: 28 mmol/L (ref 22–32)
CREATININE: 2.07 mg/dL — AB (ref 0.44–1.00)
GFR calc non Af Amer: 20 mL/min — ABNORMAL LOW (ref >60)
GFR, EST AFRICAN AMERICAN: 23 mL/min — AB (ref >60)
GLUCOSE: 83 mg/dL (ref 65–99)
Potassium: 4.6 mmol/L (ref 3.5–5.1)
Sodium: 135 mmol/L (ref 135–145)

## 2015-10-14 MED ORDER — HALOPERIDOL LACTATE 5 MG/ML IJ SOLN
0.5000 mg | Freq: Four times a day (QID) | INTRAMUSCULAR | Status: DC | PRN
  Administered 2015-10-14 – 2015-10-15 (×2): 0.5 mg via INTRAVENOUS
  Filled 2015-10-14 (×2): qty 1

## 2015-10-14 MED ORDER — CYCLOBENZAPRINE HCL 5 MG PO TABS: 5.0000 mg | ORAL_TABLET | Freq: Three times a day (TID) | ORAL | Status: DC | PRN

## 2015-10-14 NOTE — Progress Notes (Signed)
PROGRESS NOTE  [name]  [id number] DOB: October 12, 1923  DOA: 10/08/2015 PCP: [name], MD  Primary Cardiologist: Dr. Sanda Klein  Subjective: Seen with her daughter and grandson at bedside. Had 650 mL output yesterday. According family more responsive today.  Brief Narrative:  82 year old female with a PMH of chronic A. fib on Coumadin, chronic diastolic CHF, 2-D echo 123XX123 with LVEF 55-60 percent, HTN, pericardial effusion status post pericardial window 01/16/15, stroke, tachybradycardia syndrome status post pacemaker, anemia, status post recent hospitalization 09/24/15-09/30/15 for urgent sigmoid colectomy with Hartmann's procedure and small bowel resection on 05/31/15 following which transferred to inpatient rehabilitation and then transitioned home, awaiting colostomy reversal/repair of parastomal hernia, presented to ED on 10/08/15 following a mechanical fall at home and sustained left hip pain, right wrist and left forearm pain. Workup revealed left intertrochanteric hip fracture, potassium 2.2. Orthopedics/Dr. Erlinda Hong was consulted and plan for surgery on 10/09/15 after medical optimization.  Assessment & Plan:   Principal Problem:   Closed left hip fracture [facility]) Active Problems:   Pacemaker   Paroxysmal atrial fibrillation (HCC)   Hypokalemia   Anemia   Fall   Acute and chronic respiratory failure with hypoxia (HCC)   CHF (congestive heart failure) (HCC)   Left intertrochanteric hip fracture -Left intratrochanteric hip fracture after mechanical fall, orthopedics consulted status post repair on 10/09/2015 -Coumadin held, INR was 1.5 for the time of surgery. -Cardiology was consulted prior to surgery because of fragility of the patient and previous cardiovascular events. -Is still struggling to recover, still on her breather mask.  Acute respiratory failure with hypoxia -After the surgery patient spent the night in the step down and required BiPAP and nonrebreather mask  transiently -Still shuffling between BiPAP and her breather mask secondary to pulmonary edema. -I'll follow IV fluids, leading kidney to recover, 650 mL urine output from yesterday.  Acute on chronic diastolic CHF -She appeared euvolemic on admission, he developed acute respiratory failure with hypoxia, CXR showed more congestion. -Elevated BNP, CXR consistent with acute CHF as well as lower extremity edema. -Started on high-dose Lasix, creatinine continues to worsen.  Chronic atrial fibrillation/tachybradycardia syndrome status post pacemaker - CHADS2Vasc 7. INR therapeutic at 2.9 on admission. - Rate is controlled with verapamil and digoxin. Patient was on warfarin for anticoagulation. -Cardiology recommended Eliquis 2.5 mg for anticoagulation when it's okay with general surgery.  Acute on stage III chronic kidney disease - Creatinine at recent discharge 09/30/15:1.2. Now presented with creatinine of 1.5.  - Briefly hydrated with IV fluids. Chest x-ray suggests pulmonary congestion. DC IV fluids.  Hypokalemia - Potassium 2.2 on admission. Mg: 1.7. Replacing aggressively. Follow BMP and magnesium. - Potassium improved to 3.5 with multiple rounds of oral and IV potassium. Repeat oral potassium and check BMP in a.m.  Essential hypertension - Controlled.  Anemia -Hemoglobin dropped to 7.7, I'm not sure with the fluid overload she will benefit that much from transfusion. -Monitor closely, if dropped less than 7.5 might need transfusion.  Goals of care -Palliative care evaluated the patient, -Currently DNR/DNI, family wanted her to eat something and then understand the risk of aspiration. -Patient does not like the BiPAP is not wanted to be back on that.   DVT prophylaxis: SCD's Code Status: Full Family Communication: Daughters Veterinary surgeon  Disposition Plan: TBA   Consultants:   Orthopedics.  Cardiology  Procedures:   Foley  Antimicrobials:   None      Objective:  Vitals:   10/13/15 2327 10/14/15 0424 10/14/15  0706 10/14/15 1202  BP: (!) 122/56 (!) 111/50 (!) 120/100 (!) 110/51  Pulse: (!) 59 (!) 59 62 60  Resp: 13 15 12 15   Temp: 97.6 F (36.4 C) 97.7 F (36.5 C) 97.8 F (36.6 C) 97.8 F (36.6 C)  TempSrc: Axillary Axillary Axillary Axillary  SpO2: 98% 98% 94% 100%  Weight:      Height:        Intake/Output Summary (Last 24 hours) at 10/14/15 1313 Last data filed at 10/14/15 1213  Gross per 24 hour  Intake                0 ml  Output              850 ml  Net             -850 ml   Filed Weights   10/08/15 0600 10/09/15 0629  Weight: 70.4 kg (155 lb 1.6 oz) 71.7 kg (158 lb)    Examination:  General exam: Pleasant elderly frail female lying comfortably supine in bed.  Respiratory system: Clear to auscultation. Respiratory effort normal. Cardiovascular system: S1 & S2 heard, Irregularly irregular. No JVD, murmurs, rubs, gallops or clicks. No pedal edema. V paced.  Gastrointestinal system: Abdomen is nondistended, soft and nontender. No organomegaly or masses felt. Normal bowel sounds heard. Central nervous system: Alert and oriented. No focal neurological deficits. Extremities: Symmetric 5 x 5 power except left lower extremity where movements are restricted due to pain.. Skin: No rashes, lesions or ulcers Psychiatry: Judgement and insight appear normal. Mood & affect appropriate.     Data Reviewed: I have personally reviewed following labs and imaging studies  CBC:  Recent Labs Lab 10/08/15 0230  10/10/15 0517 10/11/15 0335 10/12/15 0306 10/13/15 0738 10/14/15 0205  WBC 5.7  < > 8.0 6.3 7.4 6.4 5.8  NEUTROABS 4.0  --   --   --   --   --   --   HGB 9.4*  < > 8.2* 8.7* 7.7* 9.5* 8.9*  HCT 30.6*  < > 28.1* 29.3* 27.0* 31.7* 30.6*  MCV 70.3*  < > 73.9* 73.6* 74.0* 74.2* 74.8*  PLT 253  < > 214 241 289 349 331  < > = values in this interval not displayed. Basic Metabolic Panel:  Recent Labs Lab  10/08/15 0334  10/08/15 1956 10/09/15 0754 10/10/15 0517 10/11/15 0335 10/13/15 0738 10/14/15 0205  NA  --   --  136 135 133* 132* 135 135  K 2.2*  --  2.6* 3.5 4.2 4.4 4.9 4.6  CL  --   --  93* 95* 97* 94* 95* 99*  CO2  --   < > 33* 32 27 27 27 28   GLUCOSE  --   --  160* 110* 118* 94 86 83  BUN  --   --  18 18 23* 30* 51* 61*  CREATININE  --   --  1.39* 1.35* 1.68* 1.79* 2.20* 2.07*  CALCIUM  --   < > 8.9 8.9 8.6* 8.8* 8.9 8.5*  MG 1.7  --  1.9  --   --  1.9  --   --   < > = values in this interval not displayed. GFR: Estimated Creatinine Clearance: 17.5 mL/min (by C-G formula based on SCr of 2.07 mg/dL (H)). Liver Function Tests: No results for input(s): AST, ALT, ALKPHOS, BILITOT, PROT, ALBUMIN in the last 168 hours. No results for input(s): LIPASE, AMYLASE in the last 168 hours. No  results for input(s): AMMONIA in the last 168 hours. Coagulation Profile:  Recent Labs Lab 10/08/15 0230 10/09/15 0754  INR 2.90 1.51   Cardiac Enzymes: No results for input(s): CKTOTAL, CKMB, CKMBINDEX, TROPONINI in the last 168 hours. BNP (last 3 results) No results for input(s): PROBNP in the last 8760 hours. HbA1C: No results for input(s): HGBA1C in the last 72 hours. CBG: No results for input(s): GLUCAP in the last 168 hours. Lipid Profile: No results for input(s): CHOL, HDL, LDLCALC, TRIG, CHOLHDL, LDLDIRECT in the last 72 hours. Thyroid Function Tests: No results for input(s): TSH, T4TOTAL, FREET4, T3FREE, THYROIDAB in the last 72 hours. Anemia Panel: No results for input(s): VITAMINB12, FOLATE, FERRITIN, TIBC, IRON, RETICCTPCT in the last 72 hours.  Sepsis Labs: No results for input(s): PROCALCITON, LATICACIDVEN in the last 168 hours.  Recent Results (from the past 240 hour(s))  MRSA PCR Screening     Status: Abnormal   Collection Time: 10/08/15  3:59 PM  Result Value Ref Range Status   MRSA by PCR POSITIVE (A) NEGATIVE Final    Comment:        The GeneXpert MRSA Assay  (FDA approved for NASAL specimens only), is one component of a comprehensive MRSA colonization surveillance program. It is not intended to diagnose MRSA infection nor to guide or monitor treatment for MRSA infections. CRITICAL RESULT CALLED TO, READ BACK BY AND VERIFIED WITH: Stasia Cavalier, RN AT [place] ON 10/08/15 BY C. Vaness, MLT.          Radiology Studies: Dg Chest Port 1 View  Result Date: 10/13/2015 CLINICAL DATA:  CHF. EXAM: PORTABLE CHEST 1 VIEW COMPARISON:  10/11/2015 FINDINGS: Marked cardiomegaly again noted with pulmonary vascular congestion. Bilateral lower lung atelectasis/ consolidation again noted, increased on the left. A right-sided pacemaker is again identified. There is no evidence of pneumothorax or other significant change. IMPRESSION: Bilateral lower lung atelectasis/ consolidation, increased on the left. Cardiomegaly and pulmonary vascular congestion again noted. Electronically Signed   By: Margarette Canada M.D.   On: 10/13/2015 09:57        Scheduled Meds: . apixaban  2.5 mg Oral BID  . chlorhexidine  15 mL Mouth Rinse BID  . cholecalciferol  5,000 Units Oral Daily  . feeding supplement (PRO-STAT SUGAR FREE 64)  30 mL Oral BID  . lidocaine  1 patch Transdermal Q24H  . magnesium gluconate  500 mg Oral Daily  . mouth rinse  15 mL Mouth Rinse q12n4p  . metolazone  2.5 mg Oral Daily  . metolazone  2.5 mg Oral Once  . multivitamin with minerals  1 tablet Oral Daily  . mupirocin ointment   Nasal BID  . potassium chloride  20 mEq Oral Daily  . saccharomyces boulardii  250 mg Oral BID   Continuous Infusions:     LOS: 6 days    Time spent: 45 minutes.    Birdie Hopes, MD [facility] Pager [phone]  If 7PM-7AM, please contact night-coverage [email or web address] Password [email or web address] 10/14/2015, 1:13 PM

## 2015-10-14 NOTE — Care Management Note (Addendum)
Case Management Note  Patient Details  Name: [name] MRN: [id number] Date of Birth: [birth year]  Subjective/Objective:   Patient with left hip fx, per pt recs SNF, CSW referral.  Per CSW note family leaning towards comfort care.   NCM will cont to follow for dc needs.     9/19- will monitor for another 24 hrs to see if any improvement.              Action/Plan:   Expected Discharge Date:                  Expected Discharge Plan:  [place] (Unknown)  In-House Referral:  Clinical Social Work  Discharge planning Services  CM Consult  Post Acute Care Choice:  NA Choice offered to:  NA  DME Arranged:    DME Agency:     HH Arranged:    [place] Agency:     Status of Service:  In process, will continue to follow  If discussed at Long Length of Stay Meetings, dates discussed:    Additional Comments:  Zenon Mayo, RN 10/14/2015, 6:07 PM

## 2015-10-14 NOTE — Evaluation (Signed)
Clinical/Bedside Swallow Evaluation Patient Details  Name: [name] MRN: [id number] Date of Birth: [birth year]  Today's Date: 10/14/2015 Time: SLP Start Time (ACUTE ONLY): 1004 SLP Stop Time (ACUTE ONLY): 1030 SLP Time Calculation (min) (ACUTE ONLY): 26 min  Past Medical History:  Past Medical History:  Diagnosis Date  . Anemia   . Arthritis   . Chronic atrial fibrillation (HCC)    a. on Coumadin, Digoxin, and Verapamil  . Chronic diastolic (congestive) heart failure ([place])    a. Echo 01/2015 with EF of 55-60%.  . Hip fracture, left ([place]) 09/2015   CLOSED  . History of GI bleed    a. secondary to AVM's. Treated with Fe infusion  . History of shingles   . HTN (hypertension)   . LVH (left ventricular hypertrophy)   . Obesity   . Pericardial effusion    a. s/p pericardial window 01/16/15  . Positive TB test   . Sleep apnea    mild-no cpap  . Stroke [facility])    a. 2013: right frontal  . Tachycardia-bradycardia syndrome [facility])    a. s/p Med[employer]onic [place], model number [id number], serial number [id number] H 12/2013   Past Surgical History:  Past Surgical History:  Procedure Laterality Date  . BOWEL RESECTION N/A 05/31/2015   Procedure: SMALL BOWEL RESECTION;  Surgeon: Donnie Mesa, MD;  Location: [place];  Service: General;  Laterality: N/A;  . BREAST LUMPECTOMY Bilateral    negative for cancer  . CATARACT EXTRACTION W/ INTRAOCULAR LENS  IMPLANT, BILATERAL Bilateral   . CHOLECYSTECTOMY  2005  . COLON RESECTION N/A 05/31/2015   Procedure: SIGMOID COLECTOMY;  Surgeon: Donnie Mesa, MD;  Location: [address];  Service: General;  Laterality: N/A;  . COLONOSCOPY  08/11/2011   Procedure: COLONOSCOPY;  Surgeon: Jeryl Columbia, MD;  Location: [facility] ENDOSCOPY;  Service: Endoscopy;  Laterality: N/A;  . COLOSTOMY N/A 05/31/2015   Procedure: DESCENDING COLOSTOMY;  Surgeon: Donnie Mesa, MD;  Location: [place];  Service: General;  Laterality: N/A;  . DEBRIDEMENT OF ABDOMINAL WALL ABSCESS N/A 05/31/2015    Procedure: DRAINAGE OF PELVIC ABSCESS;  Surgeon: Donnie Mesa, MD;  Location: [place];  Service: General;  Laterality: N/A;  . ESOPHAGOGASTRODUODENOSCOPY  05/19/2011   Procedure: ESOPHAGOGASTRODUODENOSCOPY (EGD);  Surgeon: Lear Ng, MD;  Location: [facility] ENDOSCOPY;  Service: Endoscopy;  Laterality: N/A;  doctor aware of inr   will try to be here no later than 230  . ESOPHAGOGASTRODUODENOSCOPY  06/22/2011   Procedure: ESOPHAGOGASTRODUODENOSCOPY (EGD);  Surgeon: Arta Silence, MD;  Location: [facility] ENDOSCOPY;  Service: Endoscopy;  Laterality: N/A;  Check PT/INR in am  . GIVENS CAPSULE STUDY  06/23/2011   Procedure: GIVENS CAPSULE STUDY;  Surgeon: Arta Silence, MD;  Location: [facility] ENDOSCOPY;  Service: Endoscopy;  Laterality: N/A;  . HOT HEMOSTASIS  08/11/2011   Procedure: HOT HEMOSTASIS (ARGON PLASMA COAGULATION/BICAP);  Surgeon: Jeryl Columbia, MD;  Location: [facility] END[facility];  Service: Endoscopy;  Laterality: N/A;  . INSERT / REPLACE / REMOVE PACEMAKER  2001   Last generator in 2006; interrogated Dec 2012  . INTRAMEDULLARY (IM) NAIL INTERTROCHANTERIC Left 10/09/2015   Procedure: INTRAMEDULLARY (IM) NAIL INTERTROCHANTRIC;  Surgeon: Leandrew Koyanagi, MD;  Location: [place];  Service: Orthopedics;  Laterality: Left;  . LEAD REVISION N/A 01/02/2014   Procedure: LEAD REVISION;  Surgeon: Sanda Klein, MD;  Location: [place] CATH LAB;  Service: Cardiovascular;  Laterality: N/A;  . LIPOMA EXCISION Left 07/2013   wrist  . MASS EXCISION Left 09/14/2013   Procedure:  EXCISION MASS LEFT WRIST;  Surgeon: Leanora Cover, MD;  Location: [place];  Service: Orthopedics;  Laterality: Left;  . PACEMAKER GENERATOR CHANGE N/A 01/02/2014   Procedure: PACEMAKER GENERATOR CHANGE;  Surgeon: Sanda Klein, MD;  Location: [place] CATH LAB;  Service: Cardiovascular;  Laterality: N/A;  . SUBXYPHOID PERICARDIAL WINDOW N/A 01/16/2015   Procedure: SUBXYPHOID PERICARDIAL WINDOW;  Surgeon: Grace Isaac, MD;  Location: [place];  Service:  Thoracic;  Laterality: N/A;  . TEE WITHOUT CARDIOVERSION N/A 01/16/2015   Procedure: TRANSESOPHAGEAL ECHOCARDIOGRAM (TEE);  Surgeon: Grace Isaac, MD;  Location: [place];  Service: Thoracic;  Laterality: N/A;  . TONSILLECTOMY AND ADENOIDECTOMY  1957   HPI:  82 year old female with a PMH of chronic A. fib on Coumadin, chronic diastolic CHF, 2-D echo 123XX123 with LVEF 55-60 percent, HTN, pericardial effusion status post pericardial window 01/16/15, stroke, tachybradycardia syndrome status post pacemaker, anemia, status post recent hospitalization 09/24/15-09/30/15 for urgent sigmoid colectomy with Hartmann's procedure and small bowel resection on 05/31/15 following which transferred to inpatient rehabilitation and then transitioned home, awaiting colostomy reversal/repair of parastomal hernia, presented to ED on 10/08/15 following a mechanical fall at home and sustained left hip pain, right wrist and left forearm pain. Workup revealed left intertrochanteric hip fracture, potassium 2.2. Orthopedics/Dr. Erlinda Hong was consulted and plan for surgery on 10/09/15 after medical optimization. Pt CXR showed bilateral lower lung atelectasis/consolidation   Assessment / Plan / Recommendation Clinical Impression  Pt had normal vocal quality and a strong cough upon SLP arrival. Pt was drowsy and required cueing for following commands throughout swallowing evaluation given poor mentation. Pt did show oral residue following regular solids, but this cleared given thin liquid. No overt s/s of aspiration were observed at bedside; however, pt's family was educated on the risk for aspiration given pt's age, recent hip fracture, decreased respiratory status, and mentation. SLP recommended considering an instrumental swallowing test for further analysis of pt's swallow. Pt's family is awaiting palliative care consult before moving forward with dysphagia tx. Recommend ice chips only and meds crushed in puree until then.    Aspiration Risk   Moderate aspiration risk    Diet Recommendation Other (Comment);Ice chips PRN after oral care (Diet TBD following palliative care consult)   Medication Administration: Crushed with puree Supervision: Full supervision/cueing for compensatory strategies Postural Changes: Seated upright at 90 degrees    Other  Recommendations Oral Care Recommendations: Oral care QID   Follow up Recommendations 24 hour supervision/assistance      Frequency and Duration            Prognosis Prognosis for Safe Diet Advancement: Good Barriers to Reach Goals: Cognitive deficits;Severity of deficits      Swallow Study   General HPI: 82 year old female with a PMH of chronic A. fib on Coumadin, chronic diastolic CHF, 2-D echo 123XX123 with LVEF 55-60 percent, HTN, pericardial effusion status post pericardial window 01/16/15, stroke, tachybradycardia syndrome status post pacemaker, anemia, status post recent hospitalization 09/24/15-09/30/15 for urgent sigmoid colectomy with Hartmann's procedure and small bowel resection on 05/31/15 following which transferred to inpatient rehabilitation and then transitioned home, awaiting colostomy reversal/repair of parastomal hernia, presented to ED on 10/08/15 following a mechanical fall at home and sustained left hip pain, right wrist and left forearm pain. Workup revealed left intertrochanteric hip fracture, potassium 2.2. Orthopedics/Dr. Erlinda Hong was consulted and plan for surgery on 10/09/15 after medical optimization. Pt CXR showed bilateral lower lung atelectasis/consolidation Type of Study: Bedside Swallow Evaluation Diet Prior to  this Study: Regular;Thin liquids Temperature Spikes Noted: No Respiratory Status: Non-rebreather History of Recent Intubation: No Behavior/Cognition: Cooperative;Requires cueing;Lethargic/Drowsy Oral Cavity Assessment: Within Functional Limits Oral Care Completed by SLP: No Oral Cavity - Dentition: Adequate natural dentition;Poor condition Self-Feeding  Abilities: Total assist Patient Positioning: Upright in bed Baseline Vocal Quality: Normal Volitional Cough: Strong Volitional Swallow: Able to elicit    Oral/Motor/Sensory Function     Ice Chips Ice chips: Within functional limits Presentation: Spoon   Thin Liquid Thin Liquid: Within functional limits Presentation: Cup;Spoon;Straw    Nectar Thick Nectar Thick Liquid: Not tested   Honey Thick Honey Thick Liquid: Not tested   Puree Puree: Within functional limits Presentation: Spoon   Solid   GO    Solid: Impaired Oral Phase Functional Implications: Oral residue       [name], Student SLP  Shela Leff 10/14/2015,1:21 PM

## 2015-10-14 NOTE — Progress Notes (Signed)
Pt will not follow commands. She will ask to get up and why she is here but I can not get her to open her eyes of squeeze my hands. Pt would open her mouth some so that I could do mouth care. Pt will not answer me when I ask her if she is in any pain. Will continue to use the painad  scale for pain. Waiting for speech therapy for swallow evaluation. Consuelo Pandy RN

## 2015-10-14 NOTE — Progress Notes (Signed)
As I entered pt's room for assessment grandson was giving pt water. I asked him to stop because we are worried she will aspirate at this time and we are waiting for a swallow evaluation from speech therapy to evaluate her further. He did stop but began to argue with me that he felt she was swallowing fine. I did some education about silent aspiration and pneumonia and he agreed to stop. Consuelo Pandy RN

## 2015-10-14 NOTE — Progress Notes (Signed)
Physical Therapy Treatment Patient Details Name: [name] MRN: [id number] DOB: [birth year] Today's Date: 10/14/2015    History of Present Illness 82 y.o. female with medical history significant of anemia, A. fib on chronic anticoagulation of Coumadin, chronic diastolic CHF (last echo in 01/2015 with EF of 55-60%), chronic pericardial effusion, recurrent diverticulitis s/p colostomy; who presented to the ED after having a fall. She sustained a L hip fx and underwent IM nailing 10/09/15.    PT Comments    Patient continues with limited arousal and not following commands.  Feel she continued skilled PT in the acute setting for mobility and to progress to SNF level of care at d/c.   Follow Up Recommendations  SNF;Supervision/Assistance - 24 hour     Equipment Recommendations  None recommended by PT    Recommendations for Other Services       Precautions / Restrictions Precautions Precautions: Fall Restrictions LLE Weight Bearing: Weight bearing as tolerated    Mobility  Bed Mobility Overal bed mobility: Needs Assistance Bed Mobility: Supine to Sit;Sit to Supine     Supine to sit: Total assist Sit to supine: Total assist   General bed mobility comments: assist to bring legs off bed and to lift trunk upright; assist for legs into bed, lowering trunk and for scooting up in bed  Transfers                 General transfer comment: unable at this time  Ambulation/Gait                 Stairs            Wheelchair Mobility    Modified Rankin (Stroke Patients Only)       Balance       Sitting balance - Comments: sat EOB x 8 minutes with work on balance, to bring forward and to R, head control and on AAROM of neck and trunk. PT with eyes closed entire session and not really responding to questions.  Did note R shoulder pain, but unable to rate; family in room and supportive throughout                            Cognition  Arousal/Alertness: Lethargic Behavior During Therapy: Restless Overall Cognitive Status: Difficult to assess                      Exercises General Exercises - Upper Extremity Shoulder Flexion: AAROM;Both;5 reps;Supine General Exercises - Lower Extremity Heel Slides: AAROM;Both;5 reps;Supine    General Comments        Pertinent Vitals/Pain Faces Pain Scale: Hurts even more Pain Location: R shoulder/back Pain Descriptors / Indicators: Aching Pain Intervention(s): Repositioned;Monitored during session    Home Living                      Prior Function            PT Goals (current goals can now be found in the care plan section) Progress towards PT goals: Not progressing toward goals - comment (level of arousal)    Frequency    Min 3X/week      PT Plan Current plan remains appropriate    Co-evaluation             End of Session   Activity Tolerance: Patient limited by lethargy Patient left: in bed;with family/visitor present     Time: [id number]  PT Time Calculation (min) (ACUTE ONLY): 24 min  Charges:  $Therapeutic Exercise: 8-22 mins $Therapeutic Activity: 8-22 mins                    G Codes:      [name] 2015-11-11, 4:45 PM  [name], [place] 11-11-15

## 2015-10-14 NOTE — Plan of Care (Signed)
Problem: Pain Managment: Goal: General experience of comfort will improve Outcome: Progressing Patient's pain management has been monitored efficiently. Repositioning and medication are given PRN when patient is in discomfort.   Problem: Activity: Goal: Risk for activity intolerance will decrease Outcome: Progressing Patient is currently on bedrest due to Hip surgery. Patient wants to get out of bed, but reinforced the reason why she cannot at the moment. Continue to turn Q2 and do ROM exercises.

## 2015-10-14 NOTE — Consult Note (Signed)
Consultation Note Date: 10/14/2015   Patient Name: [name]  DOB: 1923/11/24  MRN: [id number]  Age / Sex: 82 y.o., female  PCP: Darlyne Russian, MD Referring Physician: Verlee Monte, MD  Reason for Consultation: Establishing goals of care  HPI/Patient Profile: 82 year old female with a PMH of chronic A. fib on Coumadin, chronic diastolic CHF, 2-D echo 09/8919 with LVEF 55-60 percent, HTN, pericardial effusion status post pericardial window 01/16/15, stroke, tachybradycardia syndrome status post pacemaker, anemia, status post recent hospitalization 09/24/15-09/30/15 for urgent sigmoid colectomy with Hartmann's procedure and small bowel resection on 05/31/15 following which transferred to inpatient rehabilitation and then transitioned home, awaiting colostomy reversal/repair of parastomal hernia, presented to ED on 10/08/15 following a mechanical fall at home and now s/p hip fx repair.  Following surgery, she has had likely CHF exacerbation with poor urine output and increasing Cr with attempted diuresis.  Palliative consulted for [place].  Clinical Assessment and Goals of Care: I met today with patient, 2 daughters, and 2 family friends.  The patient was awake at times, however she reported being tired and deferred majority of conversation to her daughters.  We did review plan with patient at end of encounter and she reports agreement.  Her family reports that the patient is an independent woman who values family. They report understanding the current situation, but also reports being confused on what to do moving forward as she has now had more urine output over the last 12 hours.  We discussed pathways moving forward including continuing with current therapies vs more comfort based approach.    SUMMARY OF RECOMMENDATIONS   - Patient is DNR/DNI - She has had increase in urine output and is more alert today.  Family  understands severity of condition but wants to continue current therapy for additional 24 hours before making any further decisions regarding goals moving forward.  They are very open to discussion about plan for comfort in the event that she does not continue to improve. - We also discussed concern regarding nutrition and patient desire to have PO intake.  Discussed recommendation for instrumentation study vs remain NPO while monitor for another 24 hours vs accept risk of aspiration and proceed with initiation of diet for comfort.  Family in agreement that she would want diet for comfort.  Discussed with SLP.  Will start dysphagia 2 diet as tolerated for comfort. - PMT to f/u tomorrow.  Code Status/Advance Care Planning:  DNR   Symptom Management:   Hydromorphone as needed for dyspnea due to renal impairment.  Palliative Prophylaxis:   Aspiration, Bowel Regimen and Frequent Pain Assessment  Psycho-social/Spiritual:   Desire for further Chaplaincy support:Did not address today  Additional Recommendations: Education on Hospice  Prognosis:   Unable to determine  Discharge Planning: To Be Determined      Primary Diagnoses: Present on Admission: . Closed left hip fracture ([place]) . Pacemaker . Paroxysmal atrial fibrillation (HCC) . Hypokalemia . Anemia . Fall   I have reviewed the medical record, interviewed the patient  and family, and examined the patient. The following aspects are pertinent.  Past Medical History:  Diagnosis Date  . Anemia   . Arthritis   . Chronic atrial fibrillation (HCC)    a. on Coumadin, Digoxin, and Verapamil  . Chronic diastolic (congestive) heart failure ([place])    a. Echo 01/2015 with EF of 55-60%.  . Hip fracture, left ([place]) 09/2015   CLOSED  . History of GI bleed    a. secondary to AVM's. Treated with Fe infusion  . History of shingles   . HTN (hypertension)   . LVH (left ventricular hypertrophy)   . Obesity   . Pericardial effusion     a. s/p pericardial window 01/16/15  . Positive TB test   . Sleep apnea    mild-no cpap  . Stroke [facility])    a. 2013: right frontal  . Tachycardia-bradycardia syndrome [facility])    a. s/p Medtronic [place], model number [id number], serial number [id number] H 12/2013   Social History   Social History  . Marital status: Widowed    Spouse name: N/A  . Number of children: N/A  . Years of education: N/A   Social History Main Topics  . Smoking status: Never Smoker  . Smokeless tobacco: Never Used  . Alcohol use 0.0 oz/week     Comment: 01/02/2014 "I'll have a drink a few times/year"  . Drug use: No  . Sexual activity: No   Other Topics Concern  . None   Social History Narrative  . None   Family History  Problem Relation Age of Onset  . Stroke Mother   . Stroke Father   . Pneumonia Father   . Colon cancer Sister   . Colon cancer Daughter    Scheduled Meds: . apixaban  2.5 mg Oral BID  . chlorhexidine  15 mL Mouth Rinse BID  . cholecalciferol  5,000 Units Oral Daily  . feeding supplement (PRO-STAT SUGAR FREE 64)  30 mL Oral BID  . lidocaine  1 patch Transdermal Q24H  . magnesium gluconate  500 mg Oral Daily  . mouth rinse  15 mL Mouth Rinse q12n4p  . multivitamin with minerals  1 tablet Oral Daily  . mupirocin ointment   Nasal BID  . potassium chloride  20 mEq Oral Daily  . saccharomyces boulardii  250 mg Oral BID   Continuous Infusions:  PRN Meds:.acetaminophen **OR** acetaminophen, ALPRAZolam, alum & mag hydroxide-simeth, cyclobenzaprine, haloperidol lactate, HYDROmorphone (DILAUDID) injection, LORazepam, menthol-cetylpyridinium **OR** phenol, methocarbamol **OR** methocarbamol (ROBAXIN)  IV, metoCLOPramide **OR** metoCLOPramide (REGLAN) injection, ondansetron **OR** ondansetron (ZOFRAN) IV Medications Prior to Admission:  Prior to Admission medications   Medication Sig Start Date End Date Taking? Authorizing Provider  acetaminophen (TYLENOL) 500 MG tablet Take 1 tablet (500 mg  total) by mouth every 8 (eight) hours as needed for moderate pain. 09/30/15  Yes Mauricio Gerome Apley, MD  ALPRAZolam Duanne Moron) 0.25 MG tablet Take 1 tablet (0.25 mg total) by mouth 3 (three) times daily as needed for anxiety. 09/30/15  Yes Mauricio Gerome Apley, MD  Amino Acids-Protein Hydrolys (FEEDING SUPPLEMENT, PRO-STAT SUGAR FREE 64,) LIQD Take 30 mLs by mouth 2 (two) times daily. 09/30/15  Yes Mauricio Gerome Apley, MD  Cholecalciferol (VITAMIN D3) 5000 units CAPS Take 5,000 Units by mouth daily.   Yes Historical Provider, MD  cyclobenzaprine (FLEXERIL) 5 MG tablet Take 1 tablet (5 mg total) by mouth 3 (three) times daily as needed for muscle spasms. 09/30/15  Yes Mauricio Gerome Apley, MD  DIGITEK 125  MCG tablet Take 0.5 tablets by mouth as directed. Take 0.5 tablet 5 days a week 06/26/15  Yes Historical Provider, MD  lidocaine (LIDODERM) 5 % Place 1 patch onto the skin daily. Remove & Discard patch within 12 hours or as directed by MD 09/30/15  Yes Mauricio Gerome Apley, MD  magnesium gluconate (MAGONATE) 500 MG tablet Take 1 tablet (500 mg total) by mouth daily. 06/26/15  Yes Daniel J Angiulli, PA-C  metolazone (ZAROXOLYN) 2.5 MG tablet Take 1 tablet (2.5 mg total) by mouth 2 (two) times a week. 09/02/15 10/26/16 Yes Mihai Croitoru, MD  Multiple Vitamin (MULTIVITAMIN WITH MINERALS) TABS tablet Take 1 tablet by mouth daily.   Yes Historical Provider, MD  oxyCODONE (OXY IR/ROXICODONE) 5 MG immediate release tablet Take 1 tablet (5 mg total) by mouth every 6 (six) hours as needed for breakthrough pain. 09/30/15  Yes Mauricio Gerome Apley, MD  potassium chloride (K-DUR) 10 MEQ tablet Take 2 tablets (20 mEq total) by mouth daily. With torsemide 08/12/15  Yes Mihai Croitoru, MD  saccharomyces boulardii (FLORASTOR) 250 MG capsule Take 1 capsule (250 mg total) by mouth 2 (two) times daily. 06/26/15  Yes Daniel J Angiulli, PA-C  torsemide (DEMADEX) 20 MG tablet Take 1 tablet (20 mg total) by mouth daily. 09/30/15  Yes  Mauricio Gerome Apley, MD  verapamil (CALAN-SR) 120 MG CR tablet Take 1 tablet (120 mg total) by mouth daily. 06/26/15  Yes Daniel J Angiulli, PA-C  warfarin (COUMADIN) 5 MG tablet Take 1 tablet (5 mg total) by mouth daily at 6 PM. Patient taking differently: Take 5 mg by mouth daily at 6 PM. Take 2.'5mg'$ s on Monday, Wednesday, and Friday, take '5mg'$ s on all other days 06/26/15  Yes Daniel J Angiulli, PA-C  amoxicillin-clavulanate (AUGMENTIN) 875-125 MG tablet Take 1 tablet by mouth 2 (two) times daily. Patient not taking: Reported on 10/08/2015 09/30/15   Tawni Millers, MD  enoxaparin (LOVENOX) 40 MG/0.4ML injection Inject 0.4 mLs (40 mg total) into the skin daily. 10/09/15   [place]ip[place] Ephriam Jenkins, MD  HYDROcodone-acetaminophen (NORCO) 7.5-325 MG tablet Take 1-2 tablets by mouth every 6 (six) hours as needed for moderate pain. 10/09/15   Leandrew Koyanagi, MD   Allergies  Allergen Reactions  . Anti-Inflammatory Enzyme [Nutritional Supplements] Other (See Comments)    Retains fluids and headaches  . Iodinated Diagnostic Agents Hives    HIVES 15MIN S/P IV CONTRAST INJECTION,WILL NEED 13 HR PREP FOR FUTURE INJECTIONS, ok s/p '50mg'$  po benadryl//a.calhoun  . Spironolactone Other (See Comments)    Hair loss  . Arthrotec [Diclofenac-Misoprostol] Other (See Comments)    unknown  . Biaxin [Clarithromycin] Other (See Comments)    unknown  . Plavix [Clopidogrel Bisulfate] Other (See Comments)    unknown   Review of Systems UNABLE TO OBTAIN  Physical Exam  General: Elderly female in bed wearing facemask, Sleepy but arouses easily, in no acute distress.  HEENT: No bruits, no goiter, + JVD Heart: Regular rate and rhythm. Murmur appreciated. Lungs: Fair air movement, clear Abdomen: Soft, nontender, nondistended, positive bowel sounds.  Ext: Some edema Skin: Warm and dry Neuro: limited by mental status   Vital Signs: BP 137/86 (BP Location: Left Arm)   Pulse 63   Temp 97.6 F (36.4 C) (Axillary)   Resp  17   Ht '5\' 4"'$  (1.626 m)   Wt 71.7 kg (158 lb) Comment: bedscale  SpO2 93%   BMI 27.12 kg/m  Pain Assessment: PAINAD POSS *See Group Information*: S-Acceptable,Sleep, easy  to arouse Pain Score: Asleep   SpO2: SpO2: 93 % O2 Device:SpO2: 93 % O2 Flow Rate: .O2 Flow Rate (L/min): 8 L/min  IO: Intake/output summary:  Intake/Output Summary (Last 24 hours) at 10/14/15 2344 Last data filed at 10/14/15 2339  Gross per 24 hour  Intake              240 ml  Output             1550 ml  Net            -1310 ml    LBM: Last BM Date: 10/14/15 Baseline Weight: Weight: 70.4 kg (155 lb 1.6 oz) Most recent weight: Weight: 71.7 kg (158 lb) (bedscale)     Palliative Assessment/Data: 20%   Flowsheet Rows   Flowsheet Row Most Recent Value  Intake Tab  Referral Department  Hospitalist  Unit at Time of Referral  Intermediate Care Unit  Palliative Care Primary Diagnosis  Cardiac  Date Notified  10/12/15  Palliative Care Type  New Palliative care  Reason for referral  Clarify Goals of Care  Date of Admission  10/08/15  Date first seen by Palliative Care  10/14/15  # of days Palliative referral response time  2 Day(s)  # of days IP prior to Palliative referral  4  Clinical Assessment  Palliative Performance Scale Score  20%  Pain Max last 24 hours  Not able to report  Pain Min Last 24 hours  Not able to report  Psychosocial & Spiritual Assessment  Palliative Care Outcomes  Patient/Family meeting held?  Yes  Who was at the meeting?  Patient, 2 daughters  Palliative Care Outcomes  Clarified goals of care      Time In: 1100 Time Out: 1215 Time Total: 75 Greater than 50%  of this time was spent counseling and coordinating care related to the above assessment and plan.  Signed by: Micheline Rough, MD   Please contact Palliative Medicine Team phone at [phone] for questions and concerns.  For individual provider: See Shea Evans

## 2015-10-14 NOTE — Progress Notes (Signed)
Speech Language Pathology Note  SLP was contacted by RN and palliative care MD. Pt's family leaning towards comfort care. For now family wants to resume diet with no instrumental testing. SLP recommended Dys 2 diet and thin liquids with family accepting the risks of aspiration. SLP is s/o but please re-consult if needed.  Ezekiel Slocumb, Student SLP

## 2015-10-14 NOTE — Clinical Social Work Note (Signed)
CSW continues to follow for disposition needs. Per SLP, she received a call from RN and palliative MD who stated that patient's family "leaning towards comfort care."  Dayton Scrape, [place] [phone]

## 2015-10-14 NOTE — Progress Notes (Signed)
Speech therapy at bedside. Consuelo Pandy RN

## 2015-10-14 NOTE — Telephone Encounter (Signed)
Pt current admission, being followed by MD Croitoru in hospital at this time.

## 2015-10-14 NOTE — Progress Notes (Signed)
Patient had no UOP during shift. Nurse bladder scanned and revealed 738mL in bladder. In and Out Cath was performed and removed 694mL. Patient tolerated procedure well. Was in pain due to the movement. PRN pain med given promptly after. Will continue to monitor.  Paraskevi Pyle, RN

## 2015-10-14 NOTE — Progress Notes (Signed)
Patient Name: [name] Date of Encounter: 10/14/2015  Principal Problem:   Closed left hip fracture [facility]) Active Problems:   Pacemaker   Paroxysmal atrial fibrillation (HCC)   Hypokalemia   Anemia   Fall   Acute and chronic respiratory failure with hypoxia (HCC)   CHF (congestive heart failure) ([place])   Length of Stay: 6  SUBJECTIVE  Remrkrably, showing some improvement. More alert, at least partly oriented. UO a little better and creatinine improving, Some concern re: aspiration and barium swallow recommended.  CURRENT MEDS . apixaban  2.5 mg Oral BID  . chlorhexidine  15 mL Mouth Rinse BID  . cholecalciferol  5,000 Units Oral Daily  . feeding supplement (PRO-STAT SUGAR FREE 64)  30 mL Oral BID  . lidocaine  1 patch Transdermal Q24H  . magnesium gluconate  500 mg Oral Daily  . mouth rinse  15 mL Mouth Rinse q12n4p  . metolazone  2.5 mg Oral Daily  . metolazone  2.5 mg Oral Once  . multivitamin with minerals  1 tablet Oral Daily  . mupirocin ointment   Nasal BID  . potassium chloride  20 mEq Oral Daily  . saccharomyces boulardii  250 mg Oral BID    OBJECTIVE   Intake/Output Summary (Last 24 hours) at 10/14/15 1113 Last data filed at 10/14/15 0430  Gross per 24 hour  Intake                0 ml  Output              650 ml  Net             -650 ml   Filed Weights   10/08/15 0600 10/09/15 0629  Weight: 70.4 kg (155 lb 1.6 oz) 71.7 kg (158 lb)    PHYSICAL EXAM Vitals:   10/13/15 1504 10/13/15 2327 10/14/15 0424 10/14/15 0706  BP: (!) 117/59 (!) 122/56 (!) 111/50 (!) 120/100  Pulse: 60 (!) 59 (!) 59 62  Resp: 15 13 15 12   Temp: 97.5 F (36.4 C) 97.6 F (36.4 C) 97.7 F (36.5 C) 97.8 F (36.6 C)  TempSrc: Axillary Axillary Axillary Axillary  SpO2: 95% 98% 98% 94%  Weight:      Height:       General: Groggy, oriented to person, not location or time, no acute distress Head: no evidence of trauma, PERRL, EOMI, no exophtalmos or lid lag, no  myxedema, no xanthelasma; normal ears, nose and oropharynx Neck: 6-7 cm jugular venous pulsations and big V waves; brisk carotid pulses without delay and no carotid bruits Chest: clear to auscultation anteriorly Cardiovascular: normal position and quality of the apical impulse, regular rhythm, normal first and paradoxically split second heart sounds, no rubs or gallops, 3/6 holosystolic murmur Abdomen: no tenderness or distention, no masses by palpation, no abnormal pulsatility or arterial bruits, normal bowel sounds, no hepatosplenomegaly. Colostomy without blood Extremities: no clubbing, cyanosis or edema;  Neurological: limited exam, grossly OK  LABS  CBC  Recent Labs  10/13/15 0738 10/14/15 0205  WBC 6.4 5.8  HGB 9.5* 8.9*  HCT 31.7* 30.6*  MCV 74.2* 74.8*  PLT 349 [id number]   Basic Metabolic Panel  Recent Labs  10/13/15 0738 10/14/15 0205  NA 135 135  K 4.9 4.6  CL 95* 99*  CO2 27 28  GLUCOSE 86 83  BUN 51* 61*  CREATININE 2.20* 2.07*  CALCIUM 8.9 8.5*    Radiology Studies Imaging results have been reviewed and Dg  Chest Port 1 View  Result Date: 10/13/2015 CLINICAL DATA:  CHF. EXAM: PORTABLE CHEST 1 VIEW COMPARISON:  10/11/2015 FINDINGS: Marked cardiomegaly again noted with pulmonary vascular congestion. Bilateral lower lung atelectasis/ consolidation again noted, increased on the left. A right-sided pacemaker is again identified. There is no evidence of pneumothorax or other significant change. IMPRESSION: Bilateral lower lung atelectasis/ consolidation, increased on the left. Cardiomegaly and pulmonary vascular congestion again noted. Electronically Signed   By: Margarette Canada M.D.   On: 10/13/2015 09:57    TELE 100% V paced, afib    ASSESSMENT AND PLAN   1. Acute on chronic diastolic heart failure: currently off diuretics. Allowing renal function to recover. Hypoxia may be in large part atelectasis related, not just CHF 2. Acute renal failure, oliguric:  Improving.  Eliquis is already low dose 3. AFib, permanent: Still 100% V pacing despite stopping verapamil. She had an embolic stroke when warfarin was subtherapeutic in the past. 4. Pericardial effusion: by recent echo located exclusively posterior to the heart and relatively small in overall volume. Itdoes not appear to be of any hemodynamic consequence at this time. 5. Anemia: improved after transfusion, trending down again 6. AS: Mild and unlikely to be clinically relevant in the near future. 7. TR: She has severe tricuspid regurgitation and at least moderate pulmonary artery hypertension. Most likely this is related to chronic diastolic heart failure, but may make her sensitive to aggressive volume shifts (for example hypotension with rapid diuresis). 8. Pacemaker: Normal device function atrecent check. She was not pacemaker dependent at last check, but is now pacing 100% even off rate control meds.  Overall prognosis remains very guarded. DNR status. Palliative care consultation in process.   Sanda Klein, MD, [facility] [phone] office [phone] pager 10/14/2015 11:13 AM

## 2015-10-15 DIAGNOSIS — W19XXXA Unspecified fall, initial encounter: Secondary | ICD-10-CM

## 2015-10-15 DIAGNOSIS — Z515 Encounter for palliative care: Secondary | ICD-10-CM

## 2015-10-15 DIAGNOSIS — F411 Generalized anxiety disorder: Secondary | ICD-10-CM

## 2015-10-15 LAB — CBC
HCT: 30.3 % — ABNORMAL LOW (ref 36.0–46.0)
HEMOGLOBIN: 9 g/dL — AB (ref 12.0–15.0)
MCH: 22 pg — ABNORMAL LOW (ref 26.0–34.0)
MCHC: 29.7 g/dL — ABNORMAL LOW (ref 30.0–36.0)
MCV: 73.9 fL — ABNORMAL LOW (ref 78.0–100.0)
PLATELETS: 349 10*3/uL (ref 150–400)
RBC: 4.1 MIL/uL (ref 3.87–5.11)
RDW: 18 % — ABNORMAL HIGH (ref 11.5–15.5)
WBC: 5.7 10*3/uL (ref 4.0–10.5)

## 2015-10-15 LAB — BASIC METABOLIC PANEL
ANION GAP: 10 (ref 5–15)
BUN: 65 mg/dL — ABNORMAL HIGH (ref 6–20)
CALCIUM: 8.9 mg/dL (ref 8.9–10.3)
CHLORIDE: 98 mmol/L — AB (ref 101–111)
CO2: 29 mmol/L (ref 22–32)
CREATININE: 1.63 mg/dL — AB (ref 0.44–1.00)
GFR calc non Af Amer: 27 mL/min — ABNORMAL LOW (ref >60)
GFR, EST AFRICAN AMERICAN: 31 mL/min — AB (ref >60)
Glucose, Bld: 92 mg/dL (ref 65–99)
Potassium: 4.4 mmol/L (ref 3.5–5.1)
SODIUM: 137 mmol/L (ref 135–145)

## 2015-10-15 MED ORDER — HYDROMORPHONE HCL 1 MG/ML IJ SOLN
0.5000 mg | INTRAMUSCULAR | Status: DC | PRN
  Administered 2015-10-15 – 2015-10-18 (×14): 1 mg via INTRAVENOUS
  Administered 2015-10-18: 0.5 mg via INTRAVENOUS
  Filled 2015-10-15 (×14): qty 1

## 2015-10-15 MED ORDER — HYDROMORPHONE HCL 1 MG/ML IJ SOLN
INTRAMUSCULAR | Status: AC
  Administered 2015-10-15: 1 mg via INTRAVENOUS
  Filled 2015-10-15: qty 1

## 2015-10-15 NOTE — Progress Notes (Signed)
PROGRESS NOTE  [name]  [id number] DOB: 03/10/1923  DOA: 10/08/2015 PCP: [name], MD  Primary Cardiologist: Dr. Sanda Klein  Subjective: Seen with PT trying to get her out of bed. Patient very sleepy but much better than yesterday. Try to wean off of oxygen. Renal function improving.  Brief Narrative:  82 year old female with a PMH of chronic A. fib on Coumadin, chronic diastolic CHF, 2-D echo [id number] with LVEF 55-60 percent, HTN, pericardial effusion status post pericardial window 01/16/15, stroke, tachybradycardia syndrome status post pacemaker, anemia, status post recent hospitalization 09/24/15-09/30/15 for urgent sigmoid colectomy with Hartmann's procedure and small bowel resection on 05/31/15 following which transferred to inpatient rehabilitation and then transitioned home, awaiting colostomy reversal/repair of parastomal hernia, presented to ED on 10/08/15 following a mechanical fall at home and sustained left hip pain, right wrist and left forearm pain. Workup revealed left intertrochanteric hip fracture, potassium 2.2. Orthopedics/Dr. Erlinda Hong was consulted and plan for surgery on 10/09/15 after medical optimization.  Assessment & Plan:   Principal Problem:   Closed left hip fracture [facility]) Active Problems:   Pacemaker   Paroxysmal atrial fibrillation (HCC)   Hypokalemia   Anemia   Fall   Acute and chronic respiratory failure with hypoxia (HCC)   CHF (congestive heart failure) (HCC)   Acute pain of lower extremity   Difficulty walking   Left intertrochanteric hip fracture -Left intratrochanteric hip fracture after mechanical fall, orthopedics consulted status post repair on 10/09/2015 -Coumadin held, INR was 1.5 for the time of surgery. -Cardiology was consulted prior to surgery because of fragility of the patient and previous cardiovascular events. -On nonrebreather mask that creatinine improved and had 1 L of urine output yesterday.  Acute respiratory failure with  hypoxia -After the surgery patient spent the night in the step down and required BiPAP and nonrebreather mask transiently -Was shuffling between BiPAP and NRBM secondary to pulmonary edema. -Off of IV diuretics, appears that kidneys are recovering, 1000 mL urine output from yesterday.  Acute on chronic diastolic CHF -She appeared euvolemic on admission, he developed acute respiratory failure with hypoxia, CXR showed more congestion. -Elevated BNP, CXR consistent with acute CHF as well as lower extremity edema. -Started on high-dose Lasix, creatinine continues to worsen.  Chronic atrial fibrillation/tachybradycardia syndrome status post pacemaker - CHADS2Vasc 7. INR therapeutic at 2.9 on admission. - Rate is controlled with verapamil and digoxin. Patient was on warfarin for anticoagulation. -Cardiology recommended Eliquis 2.5 mg for anticoagulation when it's okay with general surgery.  Acute on stage III chronic kidney disease - Creatinine at recent discharge 09/30/15:1.2. Now presented with creatinine of 1.5.  - Creatinine is 1.63 today, close baseline of 1.2.  Hypokalemia - Potassium 2.2 on admission. Mg: 1.7. Replacing aggressively. Follow BMP and magnesium. - Potassium improved to 3.5 with multiple rounds of oral and IV potassium. Repeat oral potassium and check BMP in a.m.  Essential hypertension - Controlled.  Anemia -Hemoglobin dropped to 7.7, I'm not sure with the fluid overload she will benefit that much from transfusion. -Monitor closely, if dropped less than 7.5 might need transfusion.  Goals of care -Palliative care evaluated the patient, -Currently DNR/DNI, family wanted her to eat something and then understand the risk of aspiration. -Patient does not like the BiPAP is not wanted to be back on that.   DVT prophylaxis: SCD's Code Status: Full Family Communication: Daughters Veterinary surgeon  Disposition Plan: TBA   Consultants:   Orthopedics.  Cardiology  Procedures:     Foley  Antimicrobials:   None     Objective:  Vitals:   10/15/15 0746 10/15/15 0950 10/15/15 1023 10/15/15 1034  BP: 137/70     Pulse: (!) 111 (!) 112 60 60  Resp: 19 16 (!) 5 13  Temp: 97.4 F (36.3 C)     TempSrc: Axillary     SpO2: 100% 99% (!) 88% 96%  Weight:      Height:        Intake/Output Summary (Last 24 hours) at 10/15/15 1207 Last data filed at 10/15/15 0753  Gross per 24 hour  Intake              240 ml  Output             1450 ml  Net            -1210 ml   Filed Weights   10/08/15 0600 10/09/15 0629  Weight: 70.4 kg (155 lb 1.6 oz) 71.7 kg (158 lb)    Examination:  General exam: Pleasant elderly frail female lying comfortably supine in bed.  Respiratory system: Clear to auscultation. Respiratory effort normal. Cardiovascular system: S1 & S2 heard, Irregularly irregular. No JVD, murmurs, rubs, gallops or clicks. No pedal edema. V paced.  Gastrointestinal system: Abdomen is nondistended, soft and nontender. No organomegaly or masses felt. Normal bowel sounds heard. Central nervous system: Alert and oriented. No focal neurological deficits. Extremities: Symmetric 5 x 5 power except left lower extremity where movements are restricted due to pain.. Skin: No rashes, lesions or ulcers Psychiatry: Judgement and insight appear normal. Mood & affect appropriate.     Data Reviewed: I have personally reviewed following labs and imaging studies  CBC:  Recent Labs Lab 10/11/15 0335 10/12/15 0306 10/13/15 0738 10/14/15 0205 10/15/15 0529  WBC 6.3 7.4 6.4 5.8 5.7  HGB 8.7* 7.7* 9.5* 8.9* 9.0*  HCT 29.3* 27.0* 31.7* 30.6* 30.3*  MCV 73.6* 74.0* 74.2* 74.8* 73.9*  PLT 241 289 349 331 0000000   Basic Metabolic Panel:  Recent Labs Lab 10/08/15 1956  10/10/15 0517 10/11/15 0335 10/13/15 0738 10/14/15 0205 10/15/15 0529  NA 136  < > 133* 132* 135 135 137  K 2.6*  < > 4.2 4.4 4.9 4.6 4.4  CL 93*  < > 97* 94* 95* 99* 98*  CO2 33*  < > 27 27 27 28 29    GLUCOSE 160*  < > 118* 94 86 83 92  BUN 18  < > 23* 30* 51* 61* 65*  CREATININE 1.39*  < > 1.68* 1.79* 2.20* 2.07* 1.63*  CALCIUM 8.9  < > 8.6* 8.8* 8.9 8.5* 8.9  MG 1.9  --   --  1.9  --   --   --   < > = values in this interval not displayed. GFR: Estimated Creatinine Clearance: 22.3 mL/min (by C-G formula based on SCr of 1.63 mg/dL (H)). Liver Function Tests: No results for input(s): AST, ALT, ALKPHOS, BILITOT, PROT, ALBUMIN in the last 168 hours. No results for input(s): LIPASE, AMYLASE in the last 168 hours. No results for input(s): AMMONIA in the last 168 hours. Coagulation Profile:  Recent Labs Lab 10/09/15 0754  INR 1.51   Cardiac Enzymes: No results for input(s): CKTOTAL, CKMB, CKMBINDEX, TROPONINI in the last 168 hours. BNP (last 3 results) No results for input(s): PROBNP in the last 8760 hours. HbA1C: No results for input(s): HGBA1C in the last 72 hours. CBG: No results for input(s): GLUCAP in the last 168 hours. Lipid  Profile: No results for input(s): CHOL, HDL, LDLCALC, TRIG, CHOLHDL, LDLDIRECT in the last 72 hours. Thyroid Function Tests: No results for input(s): TSH, T4TOTAL, FREET4, T3FREE, THYROIDAB in the last 72 hours. Anemia Panel: No results for input(s): VITAMINB12, FOLATE, FERRITIN, TIBC, IRON, RETICCTPCT in the last 72 hours.  Sepsis Labs: No results for input(s): PROCALCITON, LATICACIDVEN in the last 168 hours.  Recent Results (from the past 240 hour(s))  MRSA PCR Screening     Status: Abnormal   Collection Time: 10/08/15  3:59 PM  Result Value Ref Range Status   MRSA by PCR POSITIVE (A) NEGATIVE Final    Comment:        The GeneXpert MRSA Assay (FDA approved for NASAL specimens only), is one component of a comprehensive MRSA colonization surveillance program. It is not intended to diagnose MRSA infection nor to guide or monitor treatment for MRSA infections. CRITICAL RESULT CALLED TO, READ BACK BY AND VERIFIED WITH: Stasia Cavalier, RN AT  [place] ON 10/08/15 BY C. Ricciardi, MLT.          Radiology Studies: No results found.      Scheduled Meds: . apixaban  2.5 mg Oral BID  . chlorhexidine  15 mL Mouth Rinse BID  . cholecalciferol  5,000 Units Oral Daily  . feeding supplement (PRO-STAT SUGAR FREE 64)  30 mL Oral BID  . lidocaine  1 patch Transdermal Q24H  . magnesium gluconate  500 mg Oral Daily  . mouth rinse  15 mL Mouth Rinse q12n4p  . multivitamin with minerals  1 tablet Oral Daily  . mupirocin ointment   Nasal BID  . potassium chloride  20 mEq Oral Daily  . saccharomyces boulardii  250 mg Oral BID   Continuous Infusions:     LOS: 7 days    Time spent: 45 minutes.    Birdie Hopes, MD [facility] Pager [phone]  If 7PM-7AM, please contact night-coverage [email or web address] Password [facility] 10/15/2015, 12:07 PM

## 2015-10-15 NOTE — Progress Notes (Signed)
Patient Name: [name] Date of Encounter: 10/15/2015  Principal Problem:   Closed left hip fracture [facility]) Active Problems:   Pacemaker   Paroxysmal atrial fibrillation (HCC)   Hypokalemia   Anemia   Fall   Acute and chronic respiratory failure with hypoxia (HCC)   CHF (congestive heart failure) (HCC)   Acute pain of lower extremity   Difficulty walking   Length of Stay: 7  SUBJECTIVE  Still quite ill, but multiple signs of improvement. More awake, occasionally agitated. Speech is sometimes coherent and I did hear her say "just let me die". Seems to recognize and identify familiar people by name.  Often just groaning. Marked improvement in ventilation and oxygenation - now on [place] O2. Urine output has picked up and creatinine has improved.  Unfortunately seems to have severe weakness in her left upper and lower extremities. This is the same area affected by her stroke in April (R posterior frontal ischemic CVA), but the deficit appears to be much denser (hard to know if her delirium is impeding her ability to comply with the exam).  CURRENT MEDS . apixaban  2.5 mg Oral BID  . chlorhexidine  15 mL Mouth Rinse BID  . cholecalciferol  5,000 Units Oral Daily  . feeding supplement (PRO-STAT SUGAR FREE 64)  30 mL Oral BID  . lidocaine  1 patch Transdermal Q24H  . magnesium gluconate  500 mg Oral Daily  . mouth rinse  15 mL Mouth Rinse q12n4p  . multivitamin with minerals  1 tablet Oral Daily  . mupirocin ointment   Nasal BID  . potassium chloride  20 mEq Oral Daily  . saccharomyces boulardii  250 mg Oral BID    OBJECTIVE   Intake/Output Summary (Last 24 hours) at 10/15/15 1222 Last data filed at 10/15/15 0753  Gross per 24 hour  Intake              120 ml  Output             1250 ml  Net            -1130 ml   Filed Weights   10/08/15 0600 10/09/15 0629  Weight: 70.4 kg (155 lb 1.6 oz) 71.7 kg (158 lb)    PHYSICAL EXAM Vitals:   10/15/15 0746 10/15/15 0950  10/15/15 1023 10/15/15 1034  BP: 137/70     Pulse: (!) 111 (!) 112 60 60  Resp: 19 16 (!) 5 13  Temp: 97.4 F (36.3 C)     TempSrc: Axillary     SpO2: 100% 99% (!) 88% 96%  Weight:      Height:       General: Groggy, oriented to person, not location or time, no acute distress Head: no evidence of trauma, PERRL, EOMI, no exophtalmos or lid lag, no myxedema, no xanthelasma; normal ears, nose and oropharynx Neck: 6-7 cm jugular venous pulsations and big V waves; brisk carotid pulses without delay and no carotid bruits Chest: clear to auscultation anteriorly Cardiovascular: normal position and quality of the apical impulse, regular rhythm, normal first and paradoxically split second heart sounds, no rubs or gallops, 3/6 holosystolic murmur Abdomen: no tenderness or distention, no masses by palpation, no abnormal pulsatility or arterial bruits, normal bowel sounds, no hepatosplenomegaly. Colostomy without blood Extremities: no clubbing, cyanosis or edema;  Neurological: limited exam, seems to have worsened left upper and lower extremity paresis. Exam limited by decreased level of consciousness and recent left hip fracture and surgery.  LABS  CBC  Recent Labs  10/14/15 0205 10/15/15 0529  WBC 5.8 5.7  HGB 8.9* 9.0*  HCT 30.6* 30.3*  MCV 74.8* 73.9*  PLT 331 0000000   Basic Metabolic Panel  Recent Labs  10/14/15 0205 10/15/15 0529  NA 135 137  K 4.6 4.4  CL 99* 98*  CO2 28 29  GLUCOSE 83 92  BUN 61* 65*  CREATININE 2.07* 1.63*  CALCIUM 8.5* 8.9   Liver Function Tests No results for input(s): AST, ALT, ALKPHOS, BILITOT, PROT, ALBUMIN in the last 72 hours.  Radiology Studies Imaging results have been reviewed and No results found.  TELE AFib, intermittent V sensing/V pacing   ASSESSMENT AND PLAN  1. Acute on chronic diastolic heart failure: improving despite being off diureticsas renal function continues to recover. Hypoxia may have also been due to atelectasis, not  just CHF 2. Acute renal failure, oliguric: Improving. Eliquis is already low dose 3. AFib, permanent: Finally seeing less V pacing afterstoppingverapamil. She had an embolic stroke when warfarin was subtherapeutic in the past. Her warfarin was reversed to allow hip surgery, now on Eliquis. She have had another stroke in the same distribution? 4. Pericardial effusion: by recent echo located exclusively posterior to the heart and relatively small in overall volume. Itdoes not appear to be of any hemodynamic consequence at this time. 5. Anemia: improved after transfusion,now steady Hgb level 6. AS: Mild and unlikely to be clinically relevant in the near future. 7. TR: She has severe tricuspid regurgitation and at least moderate pulmonary artery hypertension. Most likely this is related to chronic diastolic heart failure, but may make her sensitive to aggressive volume shifts (for example hypotension with rapid diuresis). 8. Pacemaker: Normal device function atrecent check. She is not pacemaker dependent.  It looks like she may have had another stroke which will have a major negative impact on her ability to rehabilitate and on her quality of life. On the other hand, she is showing several signs of improvement in renal, cardiac and pulmonary status and overall level of consciousness. Not sure that there is a lot of benefit in repeating CT scan of brain or a full Neurology evaluation, except to the degree that these would help [place] understand prognosis and guide family in the decision process for her care. Probably would not alter the treatment plan.    Sanda Klein, MD, [facility] [phone] office [phone] pager 10/15/2015 12:22 PM

## 2015-10-15 NOTE — Progress Notes (Signed)
Occupational Therapy Treatment Patient Details Name: [name] MRN: [id number] DOB: 06/03/1923 Today's Date: 10/15/2015    History of present illness 82 y.o. female with medical history significant of anemia, A. fib on chronic anticoagulation of Coumadin, chronic diastolic CHF (last echo in 01/2015 with EF of 55-60%), chronic pericardial effusion, recurrent diverticulitis s/p colostomy; who presented to the ED after having a fall. She sustained a L hip fx and underwent IM nailing 10/09/15.   OT comments  Pt with arousal for brief < 1 minute and then lethargic throughout session. Pt attempting to answer questions but responses are repetitive in nature or not appropriate for question asked. Pt opening eyes with mobility and moaning at times during session. Pt does not report pain to any body part but rather just a moan at times. Pt transferred to chair at this time. RN and family aware. Pt does not move L side of the body UE or LE during session. Pt noted to have subluxation to L shoulder.    Follow Up Recommendations  SNF;Supervision/Assistance - 24 hour    Equipment Recommendations  3 in 1 bedside comode;Wheelchair (measurements OT);Wheelchair cushion (measurements OT);Hospital bed    Recommendations for Other Services      Precautions / Restrictions Precautions Precautions: Fall Precaution Comments: pain in back Restrictions Weight Bearing Restrictions: No LLE Weight Bearing: Weight bearing as tolerated       Mobility Bed Mobility Overal bed mobility: Needs Assistance Bed Mobility: Supine to Sit Rolling: Max assist   Supine to sit: +2 for physical assistance;Max assist     General bed mobility comments: pt initiated with R UE and trunk. Pt requires (A) due to generalized weakness and inability to sustain initiation.  Transfers Overall transfer level: Needs assistance   Transfers: Sit to/from Stand Sit to Stand: +2 physical assistance;Total assist;From elevated  surface Stand pivot transfers: Total assist;+2 physical assistance;From elevated surface       General transfer comment: Required use of pad and rocking momentum to complete transfer. RECOMMEND HOYER LIFT FOR RN STAFF    Balance Overall balance assessment: Needs assistance Sitting-balance support: Single extremity supported;Feet supported Sitting balance-Leahy Scale: Zero Sitting balance - Comments: pt with posterior lean and incr posterior lean with prolonged sitting Postural control: Posterior lean                         ADL Overall ADL's : Needs assistance/impaired                                       General ADL Comments: Pt currently total (A) for all adls. pt agreeable to OOB. pt with arousal levels varying throughout session. pt repeating "what happen?" "donna" Pt lacks awareness to person place or time. pt tranfered supine to sit and then EOB to chair. Pt requires total +2 (A) total. Pt able to bear weight bil LE but unable to sustain. Pad used to transfer. Recommend RN staff use hoyer lift for transfers at this time.       Vision                     Perception     Praxis      Cognition   Behavior During Therapy: Flat affect Overall Cognitive Status: Difficult to assess  Extremity/Trunk Assessment               Exercises     Shoulder Instructions       General Comments      Pertinent Vitals/ Pain       Pain Assessment: Faces Faces Pain Scale: Hurts even more Pain Location: moaning but no location provided Pain Descriptors / Indicators: Moaning Pain Intervention(s): Limited activity within patient's tolerance;Monitored during session;Repositioned;Premedicated before session  Home Living                                          Prior Functioning/Environment              Frequency  Min 2X/week        Progress Toward Goals  OT Goals(current goals can now  be found in the care plan section)  Progress towards OT goals: Progressing toward goals  Acute Rehab OT Goals Patient Stated Goal: not stated OT Goal Formulation: Patient unable to participate in goal setting Time For Goal Achievement: 10/25/15 Potential to Achieve Goals: Good ADL Goals Pt Will Perform Grooming: with supervision;sitting Pt Will Perform Upper Body Bathing: with supervision;sitting Pt Will Transfer to Toilet: with supervision;bedside commode;stand pivot transfer Additional ADL Goal #1: Pt will complete bed mobility min (A)  level as precursor to adls.   Plan Discharge plan remains appropriate    Co-evaluation                 End of Session Equipment Utilized During Treatment: Oxygen   Activity Tolerance Patient tolerated treatment well   Patient Left in chair;with call bell/phone within reach;with chair alarm set   Nurse Communication Mobility status;Precautions        Time: [id number] OT Time Calculation (min): 25 min  Charges: OT General Charges $OT Visit: 1 Procedure OT Treatments $Self Care/Home Management : 8-22 mins $Therapeutic Activity: 8-22 mins  Parke Poisson B 10/15/2015, 9:25 AM  Jeri Modena   OTR/L Pager: [phone] Office: [phone] .

## 2015-10-15 NOTE — Progress Notes (Signed)
Daily Progress Note   Patient Name: [name]       Date: 10/15/2015 DOB: [birth year]  Age: 82 y.o. MRN#: [id number] Attending Physician: Verlee Monte, MD Primary Care Physician: [name], MD Admit Date: 10/08/2015  Reason for Consultation/Follow-up: Establishing goals of care  Subjective: Patient uncomfortable and moaning upon arrival to room. She is opens eyes to voice but not following commands. She is frequently moving her right arm. Daughter [name]), Donna's husband, and friend at bedside. Donna's husband tells Dr. Rowe Pavy and I that the previous concerns (heart and kidneys) seem to be improving but now they are worried she has had another stroke because she has not been moving her left arm. History of CVA in April 2013, but patient did not have deficits from this. After recent rehab, she went back home and was using a walker. Son-in-law tells [place] that they are still hopeful that she will recover but understand that imaging the head would most likely not change management. Family would like to see her comfortable and continue watchful waiting. Offered support.  Length of Stay: 7  Current Medications: Scheduled Meds:  . apixaban  2.5 mg Oral BID  . chlorhexidine  15 mL Mouth Rinse BID  . cholecalciferol  5,000 Units Oral Daily  . feeding supplement (PRO-STAT SUGAR FREE 64)  30 mL Oral BID  . lidocaine  1 patch Transdermal Q24H  . magnesium gluconate  500 mg Oral Daily  . mouth rinse  15 mL Mouth Rinse q12n4p  . multivitamin with minerals  1 tablet Oral Daily  . mupirocin ointment   Nasal BID  . potassium chloride  20 mEq Oral Daily  . saccharomyces boulardii  250 mg Oral BID    Continuous Infusions:    PRN Meds: acetaminophen **OR** acetaminophen, ALPRAZolam, alum & mag  hydroxide-simeth, cyclobenzaprine, haloperidol lactate, HYDROmorphone (DILAUDID) injection, LORazepam, menthol-cetylpyridinium **OR** phenol, methocarbamol **OR** methocarbamol (ROBAXIN)  IV, metoCLOPramide **OR** metoCLOPramide (REGLAN) injection, ondansetron **OR** ondansetron (ZOFRAN) IV  Physical Exam  Constitutional: She is uncooperative.  Opens eyes to voice but not following commands.   Cardiovascular: Regular rhythm and normal heart sounds.   Pulmonary/Chest: Effort normal. No accessory muscle usage. No tachypnea. No respiratory distress. She has decreased breath sounds.  Abdominal: Soft. Bowel sounds are normal. She exhibits no distension. There is no  tenderness.  Musculoskeletal: She exhibits edema (BLE).  Neurological: She is alert.  Skin: Skin is warm and dry.  Psychiatric: She is agitated. Cognition and memory are impaired. She is noncommunicative.  Constant moaning She is inattentive.  Nursing note and vitals reviewed.           Vital Signs: BP 121/62 (BP Location: Left Arm)   Pulse 63   Temp 97.4 F (36.3 C) (Axillary)   Resp 12   Ht 5\' 4"  (1.626 m)   Wt 71.7 kg (158 lb) Comment: bedscale  SpO2 95%   BMI 27.12 kg/m  SpO2: SpO2: 95 % O2 Device: O2 Device: Nasal Cannula O2 Flow Rate: O2 Flow Rate (L/min): 4 L/min  Intake/output summary:  Intake/Output Summary (Last 24 hours) at 10/15/15 1714 Last data filed at 10/15/15 1254  Gross per 24 hour  Intake                0 ml  Output             1150 ml  Net            -1150 ml   LBM: Last BM Date: 10/15/15 (ostomy is putting out stool) Baseline Weight: Weight: 70.4 kg (155 lb 1.6 oz) Most recent weight: Weight: 71.7 kg (158 lb) (bedscale)       Palliative Assessment/Data: PPS 20%   Flowsheet Rows   Flowsheet Row Most Recent Value  Intake Tab  Referral Department  Hospitalist  Unit at Time of Referral  Intermediate Care Unit  Palliative Care Primary Diagnosis  Cardiac  Date Notified  10/12/15  Palliative  Care Type  New Palliative care  Reason for referral  Clarify Goals of Care  Date of Admission  10/08/15  Date first seen by Palliative Care  10/14/15  # of days Palliative referral response time  2 Day(s)  # of days IP prior to Palliative referral  4  Clinical Assessment  Palliative Performance Scale Score  20%  Pain Max last 24 hours  Not able to report  Pain Min Last 24 hours  Not able to report  Psychosocial & Spiritual Assessment  Palliative Care Outcomes  Patient/Family meeting held?  Yes  Who was at the meeting?  Patient, 2 daughters  Palliative Care Outcomes  Clarified goals of care      Patient Active Problem List   Diagnosis Date Noted  . Acute pain of lower extremity   . Difficulty walking   . CHF (congestive heart failure) ([place])   . Acute and chronic respiratory failure with hypoxia ([place]) 10/10/2015  . Hip fracture, left ([place]) 10/08/2015  . Closed left hip fracture ([place]) 10/08/2015  . Anemia 10/08/2015  . Fall 10/08/2015  . Colostomy in place [facility]) 09/25/2015  . SBO (small bowel obstruction) ([place]) 09/24/2015  . Gastrointestinal hemorrhage with melena   . Subtherapeutic anticoagulation   . Severe malnutrition ([place]) 06/20/2015  . Acute deep vein thrombosis (DVT) of brachial vein of right upper extremity ([place])   . Localized edema   . Hypokalemia   . Hypomagnesemia   . Debilitated 06/14/2015  . History of creation of ostomy 06/14/2015  . Abdominal pain   . Chronic diastolic congestive heart failure ([place])   . History of CVA with residual deficit   . Respiratory depression   . Stool culture positive for Clostridium difficile   . On total parenteral nutrition (TPN)   . Urinary retention   . Leukocytosis   . Tachycardia   .  Normocytic anemia   . Acute kidney injury superimposed on chronic kidney disease ([place])   . Status post partial colectomy   . Ileus, postoperative   . Postoperative pain   . Diverticulitis of large intestine with perforation and abscess  without bleeding   . CKD (chronic kidney disease) stage 3, GFR 30-59 ml/min 05/26/2015  . HOH (hard of hearing) 05/26/2015  . Protein-calorie malnutrition, moderate ([place]) 05/26/2015  . Diverticulitis of intestine with perforation and abscess 05/25/2015  . Diverticulosis   . Chronic thoracic back pain   . Acute on chronic diastolic (congestive) heart failure ([place])   . Perforation of intestine due to diverticulitis of gastrointestinal tract ([place])   . SOB (shortness of breath) 05/01/2015  . Supratherapeutic INR   . Paroxysmal atrial fibrillation (HCC)   . Essential hypertension, benign   . Left sided abdominal pain   . Diverticulitis 03/08/2015  . Obesity   . Chronic atrial fibrillation ([place])   . Upper airway cough syndrome   . Dyspnea 05/22/2014  . Tachycardia-bradycardia syndrome with symptomatic bradycardia 01/03/2014  . History of CVA (cerebrovascular accident) 10/25/2013  . Diabetes ([place]) 08/03/2013  . Myofacial muscle pain 10/16/2011  . Back pain, Left Flank 07/01/2011  . Cerebral embolism with cerebral infarction ([place]) 06/12/2011  . CVA (cerebral infarction) 05/20/2011  . Weakness of left side of body 05/16/2011  . Elevated digoxin level 05/16/2011  . Pacemaker 05/21/2010  . Edema 04/24/2010  . Pericardial effusion, Large   . Chronic anticoagulation - Coumadin, CHADS2VASC=7   . HTN (hypertension)   . Permanent atrial fibrillation ([place]) 04/14/2010    Palliative Care Assessment & Plan   Patient Profile: 82 year old female with a PMH of chronic A. fib on Coumadin, chronic diastolic CHF, 2-D echo 123XX123 with LVEF 55-60 percent, HTN, pericardial effusion status post pericardial window 01/16/15, stroke, tachybradycardia syndrome status post pacemaker, anemia, status post recent hospitalization 09/24/15-09/30/15 for urgent sigmoid colectomy with Hartmann's procedure and small bowel resection on 05/31/15 following which transferred to inpatient rehabilitation and then transitioned home,  awaiting colostomy reversal/repair of parastomal hernia, presented to ED on 10/08/15 following a mechanical fall at home and now s/p hip fx repair.  Following surgery, she has had likely CHF exacerbation with poor urine output and increasing Cr with attempted diuresis.  Palliative consulted for [place]. New onset agitation. Patient may have had another stroke-no function or motor strength on left side per NP assessment and family.   Assessment: Fall Closed left hip fracture Acute respiratory failure with hypoxia Acute on chronic diastolic CHF Paroxysmal atrial fibrillation Sick sinus syndrome s/p pacemaker Acute on chronic kidney disease  Recommendations/Plan:  DNR/DNI  Family understands severity of condition but would like to continue watchful waiting. Patient has shown improvement in urine output and kidney function, but displaying change in mental status and new onset left hemiplegia (? CVA). She would most likely not benefit from full stroke workup at this point.   Patient uncomfortable with constant moaning. Increased Dilaudid 0.5-1mg  IV q2h prn.   Continue other prn's (xanax, flexeril, robaxin) as needed and if patient awake/alert enough to swallow.   PMT will continue to follow up with patient and family regarding goals of care during hospitalization.   Goals of Care and Additional Recommendations:  Limitations on Scope of Treatment: Full Scope Treatment  Code Status: DNR   Code Status Orders        Start     Ordered   10/11/15 [id number]  Do not attempt resuscitation (DNR)  Continuous    Question Answer Comment  In the event of cardiac or respiratory ARREST Do not call a "code blue"   In the event of cardiac or respiratory ARREST Do not perform Intubation, CPR, defibrillation or ACLS   In the event of cardiac or respiratory ARREST Use medication by any route, position, wound care, and other measures to relive pain and suffering. May use oxygen, suction and manual treatment of  airway obstruction as needed for comfort.      10/11/15 1653    Code Status History    Date Active Date Inactive Code Status Order ID Comments User Context   10/08/2015  5:09 AM 10/11/2015  4:53 PM Full Code [id number]  Norval Morton, MD ED   09/24/2015 11:05 PM 09/30/2015  2:41 PM Full Code [id number]  Edwin Dada, MD Inpatient   06/14/2015  5:00 PM 06/26/2015  1:42 PM Full Code [id number]  Lavon Paganini [place], PA-C Inpatient   05/25/2015  9:27 PM 06/14/2015  5:00 PM Full Code [id number]  Olam Idler, MD Inpatient   05/01/2015  8:05 PM 05/08/2015  7:13 PM Full Code [id number]  Mercy Riding, MD Inpatient   04/03/2015  7:31 PM 04/11/2015  4:53 PM Full Code [id number]  Rogue Bussing, MD ED   03/08/2015  8:51 PM 03/09/2015  8:06 PM Full Code [id number]  Frazier Richards, MD Inpatient   01/16/2015 11:49 AM 01/26/2015  6:19 PM Full Code [id number]  Nani Skillern, PA-C Inpatient   01/15/2015 11:13 AM 01/16/2015 11:49 AM Full Code [id number]  Lonn Georgia, PA-C Inpatient   01/02/2014  4:24 PM 01/03/2014  1:35 PM Full Code [id number]  Sanda Klein, MD Inpatient   06/21/2011  8:59 AM 06/24/2011  4:38 PM Partial Code [id number]  Carolin Guernsey, MD Inpatient   06/20/2011  4:45 PM 06/21/2011  8:59 AM Partial Code [id number]  Angelica Ran, MD Inpatient   05/16/2011  5:59 PM 05/20/2011  5:57 PM Partial Code [id number]  Carlynn Spry, RN Inpatient    Advance Directive Documentation   Flowsheet Row Most Recent Value  Type of Advance Directive  Healthcare Power of Attorney, Living will  Pre-existing out of facility DNR order (yellow form or pink MOST form)  No data  "MOST" Form in Place?  No data       Prognosis:   Unable to determine-guarded. High risk for ongoing decline. Cautiously monitoring trajectory.   Discharge Planning:  To Be Determined  Care plan was discussed with Daughter [name], Son-in-law, friend at bedside, RN, and Dr. Rowe Pavy  Thank you for allowing the Palliative Medicine  Team to assist in the care of this patient.   Time In: 1415 Time Out: 1445 Total Time 32min Prolonged Time Billed  no       Greater than 50%  of this time was spent counseling and coordinating care related to the above assessment and plan.  Ihor Dow, FNP-C Palliative Medicine Team  Phone: [phone] Fax: [phone]  Addendum: Agree with above note, patient seen and examined, detailed discussions with family as outlined above Loistine Chance MD [facility] health palliative medicine [phone]   Please contact Palliative Medicine Team phone at [phone] for questions and concerns.

## 2015-10-16 DIAGNOSIS — N179 Acute kidney failure, unspecified: Secondary | ICD-10-CM

## 2015-10-16 DIAGNOSIS — G9340 Encephalopathy, unspecified: Secondary | ICD-10-CM

## 2015-10-16 LAB — BASIC METABOLIC PANEL
ANION GAP: 9 (ref 5–15)
BUN: 53 mg/dL — AB (ref 6–20)
CALCIUM: 8.9 mg/dL (ref 8.9–10.3)
CO2: 28 mmol/L (ref 22–32)
Chloride: 101 mmol/L (ref 101–111)
Creatinine, Ser: 1.28 mg/dL — ABNORMAL HIGH (ref 0.44–1.00)
GFR calc Af Amer: 41 mL/min — ABNORMAL LOW (ref >60)
GFR calc non Af Amer: 36 mL/min — ABNORMAL LOW (ref >60)
GLUCOSE: 107 mg/dL — AB (ref 65–99)
Potassium: 3.8 mmol/L (ref 3.5–5.1)
Sodium: 138 mmol/L (ref 135–145)

## 2015-10-16 LAB — CBC
HEMATOCRIT: 28.5 % — AB (ref 36.0–46.0)
HEMOGLOBIN: 8.3 g/dL — AB (ref 12.0–15.0)
MCH: 21.7 pg — AB (ref 26.0–34.0)
MCHC: 29.1 g/dL — AB (ref 30.0–36.0)
MCV: 74.6 fL — ABNORMAL LOW (ref 78.0–100.0)
Platelets: 316 10*3/uL (ref 150–400)
RBC: 3.82 MIL/uL — ABNORMAL LOW (ref 3.87–5.11)
RDW: 18.3 % — ABNORMAL HIGH (ref 11.5–15.5)
WBC: 5.8 10*3/uL (ref 4.0–10.5)

## 2015-10-16 MED ORDER — DEXTROSE-NACL 5-0.45 % IV SOLN
INTRAVENOUS | Status: DC
  Administered 2015-10-16 – 2015-10-20 (×5): via INTRAVENOUS
  Filled 2015-10-16 (×7): qty 1000

## 2015-10-16 NOTE — Progress Notes (Signed)
Family educated about contact precautions and importance of using hand gel, gowns/gloves and washing hands when leaving room. Consuelo Pandy RN

## 2015-10-16 NOTE — Progress Notes (Signed)
Patient is less alert and responsive today. Pt stills appears to have pain by moaning frequently and calling out "Mama." Patient did not follow my commands or answer any questions this morning. Will continue to monitor.

## 2015-10-16 NOTE — Progress Notes (Signed)
Pt very agitated, she is moaning and pushing me away as Im trying to assess her. Pt will not follow commands at this time. 1mg  dilaudid given. Consuelo Pandy RN

## 2015-10-16 NOTE — Progress Notes (Addendum)
PROGRESS NOTE  [name]  [id number] DOB: 1923-05-08  DOA: 10/08/2015 PCP: [name], MD  Primary Cardiologist: Dr. Sanda Klein  Subjective: Pt nearly unresponsive this morning, shakes head yes/no appropriately, nonverbal for me. Family reports gradual worsening of mental state over past 24 hours. She's not eating, in fact she spat out jello that was fed to her.  Brief Narrative:  82 year old female with a PMH of chronic A. fib on Coumadin, chronic diastolic CHF, 2-D echo [id number] with LVEF 55-60 percent, HTN, pericardial effusion status post pericardial window 01/16/15, stroke, tachybradycardia syndrome status post pacemaker, anemia, status post recent hospitalization 09/24/15-09/30/15 for urgent sigmoid colectomy with Hartmann's procedure and small bowel resection on 05/31/15 following which transferred to inpatient rehabilitation and then transitioned home, awaiting colostomy reversal/repair of parastomal hernia, presented to ED on 10/08/15 following a mechanical fall at home and sustained left hip pain. Workup revealed left intertrochanteric hip fracture. She underwent ORIF by Dr. Erlinda Hong [id number] without complications. Unfortunately she experienced volume overload, renal failure and acute respiratory failure that required BiPAP. These conditions have improved (creatine trending down, weaning oxygen slowly) though mental status has waxed and waned following an overall declining trajectory.   Assessment & Plan:   Principal Problem:   Closed left hip fracture [facility]) Active Problems:   Pacemaker   Paroxysmal atrial fibrillation (HCC)   Hypokalemia   Anemia   Fall   Acute and chronic respiratory failure with hypoxia (HCC)   CHF (congestive heart failure) (HCC)   Acute pain of lower extremity   Difficulty walking   Palliative care encounter   Anxiety state  Acute encephalopathy: Family denies h/o any cognitive impairment. Multifactorial, worsening postoperatively most recently  significantly worsened despite overall physiologic improvement in cardiorespiratory and renal function. Delirium likely contributing, ischemic CVA also likely contributing. Most recently worsened following uptitration of analgesics, though these are prn and not being given unless agitated. - Dense left hemiparesis consistent with new cerebrovascular insult. No change in management would likely come from diagnostic imaging, so this is being deferred. - Poor po (none x24hrs) concerning family. Will start D51/2NS at 50cc/hr to avoid recurrent overload and prerenal insult. - Will continue observing her  Goals of care -Palliative care assistance appreciated. Family worried about poor po, interested in discussing enteral feeding options. -Currently DNR/DNI. -Patient does not like the BiPAP, requested not to be put back  Acute respiratory failure with hypoxia: After the surgery patient spent the night in the step down and required BiPAP and nonrebreather mask transiently - Has not needed NRB x24 hrs. - Improving with diuresis due to renal recovery.   Acute on chronic diastolic CHF: Elevated BNP, CXR with vascular congestion. + pitting dependent edema.  - Was not responsive to IV diuretics which were stopped due to renal failure, but diuresis has begun with renal recovery despite taking little po.  Permanent atrial fibrillation/tachybradycardia syndrome s/p pacemaker: Recent check of PPM showed normal function, not dependent. CHADS2Vasc 7. INR therapeutic at 2.9 on admission. - Switched to eliquis low dose per cardiology.  - Rate is controlled without verapamil and digoxin.  Acute on stage III chronic kidney disease: Oliguric, resolving. - Creatinine at recent discharge 09/30/15:1.2. Now returned to this presumed baseline. Good UOP (6L 9/18 and 7L 9/19 documented) - Continue to monitor  Left intertrochanteric hip fracture: due to fall at home. s/p ORIF 9/13 by Dr. Erlinda Hong.  - Pain control as  ordered  Hypokalemia: Resolved. - Monitor daily  Essential hypertension - Controlled.  Anemia: Presumed acute, slowly declining since transfusion without evidence of bleeding. -Monitor daily  DVT prophylaxis: SCD's Code Status: DNR Family Communication: Daughter, Helene Kelp and her husband Ellik (sp?) Disposition Plan: Transfer to floor likely 9/21. Family averse to transfer this morning.  Consultants:   Orthopedics, Dr. Erlinda Hong  Cardiology, Dr. Sallyanne Kuster  Procedures:   Foley  Antimicrobials:   None   Objective:  Vitals:   10/16/15 0545 10/16/15 0710 10/16/15 1136 10/16/15 1438  BP: (!) 95/45 (!) 123/56 (!) 129/55 (!) 95/56  Pulse: (!) 59 67 66 62  Resp: 15 20 (!) 21 (!) 21  Temp:  97.5 F (36.4 C) 98 F (36.7 C) 98 F (36.7 C)  TempSrc:  Oral Axillary Axillary  SpO2: 95% 90% 92% 96%  Weight:      Height:        Intake/Output Summary (Last 24 hours) at 10/16/15 1815 Last data filed at 10/16/15 1442  Gross per 24 hour  Intake                0 ml  Output             1495 ml  Net            -1495 ml   Filed Weights   10/08/15 0600 10/09/15 0629  Weight: 70.4 kg (155 lb 1.6 oz) 71.7 kg (158 lb)    Examination: General exam: Frail elderly female lying in bed with mouth open, unaware and unresponsive to surroundings. Respiratory system: Clear to auscultation. Respiratory effort normal on 4L by [place]. Cardiovascular system: S1 & S2 heard, Irregularly irregular. No JVD, II/VI SEM, rubs, gallops or clicks. No pedal edema. V paced on monitor.  Gastrointestinal system: Abdomen is nondistended, soft and nontender. No organomegaly or masses felt. Normal bowel sounds heard. GU: Foley in place. Central nervous system: Not oriented, poorly responsive, not following commands. Extremities: Left hemiparesis, though pt not cooperative with exam. Moving RUE spontaneously. Skin: No rashes, lesions or ulcers   Data Reviewed: I have personally reviewed following labs and imaging  studies  CBC:  Recent Labs Lab 10/12/15 0306 10/13/15 0738 10/14/15 0205 10/15/15 0529 10/16/15 0405  WBC 7.4 6.4 5.8 5.7 5.8  HGB 7.7* 9.5* 8.9* 9.0* 8.3*  HCT 27.0* 31.7* 30.6* 30.3* 28.5*  MCV 74.0* 74.2* 74.8* 73.9* 74.6*  PLT 289 349 331 349 [id number]   Basic Metabolic Panel:  Recent Labs Lab 10/11/15 0335 10/13/15 0738 10/14/15 0205 10/15/15 0529 10/16/15 0405  NA 132* 135 135 137 138  K 4.4 4.9 4.6 4.4 3.8  CL 94* 95* 99* 98* 101  CO2 27 27 28 29 28   GLUCOSE 94 86 83 92 107*  BUN 30* 51* 61* 65* 53*  CREATININE 1.79* 2.20* 2.07* 1.63* 1.28*  CALCIUM 8.8* 8.9 8.5* 8.9 8.9  MG 1.9  --   --   --   --    GFR: Estimated Creatinine Clearance: 28.4 mL/min (by C-G formula based on SCr of 1.28 mg/dL (H)). Liver Function Tests: No results for input(s): AST, ALT, ALKPHOS, BILITOT, PROT, ALBUMIN in the last 168 hours. No results for input(s): LIPASE, AMYLASE in the last 168 hours. No results for input(s): AMMONIA in the last 168 hours. Coagulation Profile: No results for input(s): INR, PROTIME in the last 168 hours. Cardiac Enzymes: No results for input(s): CKTOTAL, CKMB, CKMBINDEX, TROPONINI in the last 168 hours. BNP (last 3 results) No results for input(s): PROBNP in the last 8760 hours. HbA1C: No results for input(s):  HGBA1C in the last 72 hours. CBG: No results for input(s): GLUCAP in the last 168 hours. Lipid Profile: No results for input(s): CHOL, HDL, LDLCALC, TRIG, CHOLHDL, LDLDIRECT in the last 72 hours. Thyroid Function Tests: No results for input(s): TSH, T4TOTAL, FREET4, T3FREE, THYROIDAB in the last 72 hours. Anemia Panel: No results for input(s): VITAMINB12, FOLATE, FERRITIN, TIBC, IRON, RETICCTPCT in the last 72 hours.  Sepsis Labs: No results for input(s): PROCALCITON, LATICACIDVEN in the last 168 hours.  Recent Results (from the past 240 hour(s))  MRSA PCR Screening     Status: Abnormal   Collection Time: 10/08/15  3:59 PM  Result Value Ref  Range Status   MRSA by PCR POSITIVE (A) NEGATIVE Final    Comment:        The GeneXpert MRSA Assay (FDA approved for NASAL specimens only), is one component of a comprehensive MRSA colonization surveillance program. It is not intended to diagnose MRSA infection nor to guide or monitor treatment for MRSA infections. CRITICAL RESULT CALLED TO, READ BACK BY AND VERIFIED WITH: Stasia Cavalier, RN AT [facility] ON 10/08/15 BY C. Stepanian, MLT.      Radiology Studies: No results found.  Scheduled Meds: . apixaban  2.5 mg Oral BID  . chlorhexidine  15 mL Mouth Rinse BID  . cholecalciferol  5,000 Units Oral Daily  . feeding supplement (PRO-STAT SUGAR FREE 64)  30 mL Oral BID  . lidocaine  1 patch Transdermal Q24H  . magnesium gluconate  500 mg Oral Daily  . mouth rinse  15 mL Mouth Rinse q12n4p  . multivitamin with minerals  1 tablet Oral Daily  . mupirocin ointment   Nasal BID  . potassium chloride  20 mEq Oral Daily  . saccharomyces boulardii  250 mg Oral BID   Continuous Infusions:    LOS: 8 days   Time spent: 35 minutes.  Vance Gather, MD [facility] Pager [phone]  If 7PM-7AM, please contact night-coverage [email or web address] Password [facility] 10/16/2015, 6:15 PM

## 2015-10-16 NOTE — Progress Notes (Signed)
Patient Name: [name] Date of Encounter: 10/16/2015  Principal Problem:   Closed left hip fracture [facility] Active Problems:   Pacemaker   Paroxysmal atrial fibrillation (HCC)   Hypokalemia   Anemia   Fall   Acute and chronic respiratory failure with hypoxia (HCC)   CHF (congestive heart failure) (HCC)   Acute pain of lower extremity   Difficulty walking   Palliative care encounter   Anxiety state   Length of Stay: 8  SUBJECTIVE  Unfortunately seems to have declined today, only moans and ask for her mother. Renal function has continued to improve, but Hgb has dropped. Currently being bathed at the time of assessment.    CURRENT MEDS . apixaban  2.5 mg Oral BID  . chlorhexidine  15 mL Mouth Rinse BID  . cholecalciferol  5,000 Units Oral Daily  . feeding supplement (PRO-STAT SUGAR FREE 64)  30 mL Oral BID  . lidocaine  1 patch Transdermal Q24H  . magnesium gluconate  500 mg Oral Daily  . mouth rinse  15 mL Mouth Rinse q12n4p  . multivitamin with minerals  1 tablet Oral Daily  . mupirocin ointment   Nasal BID  . potassium chloride  20 mEq Oral Daily  . saccharomyces boulardii  250 mg Oral BID    OBJECTIVE   Intake/Output Summary (Last 24 hours) at 10/16/15 1118 Last data filed at 10/16/15 0831  Gross per 24 hour  Intake                0 ml  Output             1395 ml  Net            -1395 ml   Filed Weights   10/08/15 0600 10/09/15 0629  Weight: 155 lb 1.6 oz (70.4 kg) 158 lb (71.7 kg)    PHYSICAL EXAM Vitals:   10/16/15 0425 10/16/15 0450 10/16/15 0545 10/16/15 0710  BP: (!) 87/43 (!) 91/42 (!) 95/45 (!) 123/56  Pulse: (!) 59 60 (!) 59 67  Resp: 15 16 15 20   Temp:    97.5 F (36.4 C)  TempSrc:    Oral  SpO2: 96% 94% 95% 90%  Weight:      Height:       General: Groggy, only moans and calls out for her mother. PERRL, EOMI, no exophtalmos or lid lag, no myxedema, no xanthelasma; normal ears, nose and oropharynx Neck: 6-7 cm jugular venous  pulsations and big V waves; brisk carotid pulses without delay and no carotid bruits Chest: clear to auscultation anteriorly Cardiovascular: normal position and quality of the apical impulse, regular rhythm, normal first and paradoxically split second heart sounds, no rubs or gallops, 3/6 holosystolic murmur Abdomen: no tenderness or distention, no masses by palpation, no abnormal pulsatility or arterial bruits, normal bowel sounds, no hepatosplenomegaly. Colostomy without blood Extremities: no clubbing, cyanosis or edema;  Neurological: limited exam, seems to have worsened left upper and lower extremity paresis. Exam limited by decreased level of consciousness and unable to follow commands.  LABS  CBC  Recent Labs  10/15/15 0529 10/16/15 0405  WBC 5.7 5.8  HGB 9.0* 8.3*  HCT 30.3* 28.5*  MCV 73.9* 74.6*  PLT 349 123XX123   Basic Metabolic Panel  Recent Labs  10/15/15 0529 10/16/15 0405  NA 137 138  K 4.4 3.8  CL 98* 101  CO2 29 28  GLUCOSE 92 107*  BUN 65* 53*  CREATININE 1.63* 1.28*  CALCIUM  8.9 8.9   Liver Function Tests No results for input(s): AST, ALT, ALKPHOS, BILITOT, PROT, ALBUMIN in the last 72 hours.  Radiology Studies Imaging results have been reviewed and No results found.  TELE AFib, intermittent V sensing/V pacing   ASSESSMENT AND PLAN  1. Acute on chronic diastolic heart failure: improving despite being off diureticsas renal function continues to recover. Hypoxia may have also been due to atelectasis, not just CHF 2. Acute renal failure, oliguric: Improving. Eliquis is already low dose 3. AFib, permanent: less V pacing afterstoppingverapamil. She had an embolic stroke when warfarin was subtherapeutic in the past. Her warfarin was reversed to allow hip surgery, now on Eliquis. She have had another stroke in the same distribution, appears no further testing will be done regarding this as will likely not alter the plan of care. 4. Pericardial effusion: by  recent echo located exclusively posterior to the heart and relatively small in overall volume. Itdoes not appear to be of any hemodynamic consequence at this time. 5. Anemia: drop again today 9.0>>8.3 6. AS: Mild and unlikely to be clinically relevant in the near future. 7. TR: She has severe tricuspid regurgitation and at least moderate pulmonary artery hypertension. Most likely this is related to chronic diastolic heart failure, but may make her sensitive to aggressive volume shifts (for example hypotension with rapid diuresis). 8. Pacemaker: Normal device function atrecent check. She is not pacemaker dependent.  Appears she may have had another stroke which will have a major negative impact on her ability to rehabilitate and on her quality of life. Does not appear that further testing regarding this clinical finding will be pursued at this time. Palliative was consulted yesterday and are following with the patient.   Reino Bellis, NP-C 10/16/2015 11:18 AM  I have seen and examined the patient along with Reino Bellis, NP-C.  I have reviewed the chart, notes and new data.  I agree with NP's note.  Key new complaints: delirium with sometimes coherent speech, mostly moans. She is not eating. Key examination changes: no overt hypervolemia (SCDs on legs), prominent v waves of TR, still with dense left hemiplegia. Currently AF with native AV conduction, controlled V rate. Key new findings / data: renal function continues to improve, as does UO, despite no fluid intake and no diuretics. Suggests recovering ATN.  PLAN:  Palliation of symptoms seems to be the priority. If family prefers, she may actually do better outside ICU setting.  Sanda Klein, MD, [place] [phone] 10/16/2015, 12:54 PM

## 2015-10-16 NOTE — Progress Notes (Signed)
@  6 Paged Lurlean Leyden of [facility] regarding pt's soft BPs (SBP 80s-90s). Pt resting comfortably post Ativan and Robaxin administration. No orders received. Will continue to monitor.

## 2015-10-17 LAB — BASIC METABOLIC PANEL
Anion gap: 9 (ref 5–15)
BUN: 41 mg/dL — ABNORMAL HIGH (ref 6–20)
CO2: 30 mmol/L (ref 22–32)
CREATININE: 1.01 mg/dL — AB (ref 0.44–1.00)
Calcium: 9.2 mg/dL (ref 8.9–10.3)
Chloride: 102 mmol/L (ref 101–111)
GFR calc Af Amer: 55 mL/min — ABNORMAL LOW (ref >60)
GFR, EST NON AFRICAN AMERICAN: 48 mL/min — AB (ref >60)
GLUCOSE: 110 mg/dL — AB (ref 65–99)
Potassium: 3.5 mmol/L (ref 3.5–5.1)
Sodium: 141 mmol/L (ref 135–145)

## 2015-10-17 LAB — CBC
HCT: 31.2 % — ABNORMAL LOW (ref 36.0–46.0)
HEMOGLOBIN: 9.1 g/dL — AB (ref 12.0–15.0)
MCH: 21.9 pg — AB (ref 26.0–34.0)
MCHC: 29.2 g/dL — AB (ref 30.0–36.0)
MCV: 75.2 fL — ABNORMAL LOW (ref 78.0–100.0)
PLATELETS: 316 10*3/uL (ref 150–400)
RBC: 4.15 MIL/uL (ref 3.87–5.11)
RDW: 18.5 % — ABNORMAL HIGH (ref 11.5–15.5)
WBC: 6.1 10*3/uL (ref 4.0–10.5)

## 2015-10-17 MED ORDER — ENOXAPARIN SODIUM 80 MG/0.8ML ~~LOC~~ SOLN
1.0000 mg/kg | Freq: Two times a day (BID) | SUBCUTANEOUS | Status: DC
  Administered 2015-10-17 – 2015-10-18 (×2): 70 mg via SUBCUTANEOUS
  Filled 2015-10-17 (×2): qty 0.8

## 2015-10-17 MED ORDER — HEPARIN (PORCINE) IN NACL 100-0.45 UNIT/ML-% IJ SOLN
800.0000 [IU]/h | INTRAMUSCULAR | Status: DC
  Filled 2015-10-17: qty 250

## 2015-10-17 NOTE — Consult Note (Signed)
[facility] Nurse wound consult note Reason for Consult: Worsening sacral ulcer  Wound type: Combination of factors including pressure, moisture, fungal rash to buttocks, inner thighs and perineal area, and inadequate nutritional intake, end stage of life.  Pressure Ulcer POA: No Measurement: 3.5 cm x 4 cm Stage II primarily to right buttock. Wound bed: 100% pink, moist Drainage (amount, consistency, odor) minimal drainage on foam dressing.  No odor detected. Periwound: Fragile, peeling skin with redness and satellite lesions consistent with fungal involvement to buttocks, perneal area and inner thighs. Dressing procedure/placement/frequency: Foam Sacral dressing q 4 days and prn soiling.  Additional recommendations include:  Air mattress; turning to right or left and avoiding supine to greatest extent possible; use of antifungal powder 4 times daily.  Discussed POC with patient and bedside nurse.  Re consult if needed, will not follow at this time. Thanks Val Riles MSN, RN, CNS-BC, [employer]

## 2015-10-17 NOTE — Progress Notes (Signed)
[facility] Interim Progress Note  Paged by Burman Nieves, RN stating pt had increased hypoxemia requiring reversion to non-rebreather to maintain saturations > 90%. On arrival for evaluation, pt appears unchanged from how I've seen her, minimally responsive to verbal or tactile stimuli, occasionally groaning. RN reported stabilization of respiratory status since page. The monitor shows 100% oxygen saturation, exam reveals descreased bibasilar breath sounds without crackles, and her work of breathing is at her baseline. Her daughter, [name], and son-in-law, Bonney Roussel, are at the bedside. They benefited from discussion with Palliative team earlier today. Alec asked if her condition was "stable." I shared my assessment that she is not protecting her airway likely due to deficits from a stroke and that, as a result, her respiratory status is precarious at this time. He asked about the importance of tube feeding, which I told him I believe is currently an insignificant issue. I further reiterated that we could increase administration of medications for pain to provide more comfort, though at this time they feel that the risk of respiratory depression would not outweigh this benefit. All other questions and concerns were addressed.  Of note, HCPOA documentation (copied in paper chart) states that the [facility] hold power of attorney. Helene Kelp will arrive into town tomorrow.   Ryan B. Bonner Puna, MD 10/17/2015 6:11 PM

## 2015-10-17 NOTE — Progress Notes (Signed)
PROGRESS NOTE  [name]  [id number] DOB: 1923-06-06  DOA: 10/08/2015 PCP: [name], MD  Primary Cardiologist: Dr. Dani Gobble Croitoru  Subjective: Pt unchanged from yesterday, groaning and mumbling with stimulus. Last food was eggs 2 mornings ago. Becomes agitated "when pain meds wear off," otherwise sleeps.  Brief Narrative:  82 year old female with a PMH of chronic A. fib on Coumadin, chronic diastolic CHF, 2-D echo [id number] with LVEF 55-60 percent, HTN, pericardial effusion status post pericardial window 01/16/15, stroke, tachybradycardia syndrome status post pacemaker, anemia, status post recent hospitalization 09/24/15-09/30/15 for urgent sigmoid colectomy with Hartmann's procedure and small bowel resection on 05/31/15 following which transferred to inpatient rehabilitation and then transitioned home, awaiting colostomy reversal/repair of parastomal hernia, presented to ED on 10/08/15 following a mechanical fall at home and sustained left hip pain. Workup revealed left intertrochanteric hip fracture. She underwent ORIF by Dr. Erlinda Hong [id number] without complications. Unfortunately she experienced volume overload, renal failure and acute respiratory failure that required BiPAP. These conditions have improved (creatine trending down, weaning oxygen slowly) though mental status has waxed and waned following an overall declining trajectory.   Assessment & Plan:   Principal Problem:   Closed left hip fracture (HCC) Active Problems:   Pacemaker   Paroxysmal atrial fibrillation (HCC)   Hypokalemia   Anemia   Fall   Acute and chronic respiratory failure with hypoxia (HCC)   CHF (congestive heart failure) (HCC)   Acute pain of lower extremity   Difficulty walking   Palliative care encounter   Anxiety state   Acute renal failure (HCC)   Acute encephalopathy  Acute encephalopathy: Stable, not making expected improvements indicating ischemic CVA is likely contributing. Still some waxing/waning  consistent with delirium for which she has many risk factors (post-op, hospitalized, pain, analgesics, monitors/foley, . Family denies h/o any cognitive impairment.  - Dense left hemiparesis consistent with new cerebrovascular insult. No change in management would likely come from diagnostic imaging, so this is being deferred. - Poor po x48 hrs, started D5 1/2NS at 50cc/hr to avoid recurrent overload, but will need decision regarding direction of feeding from here.  - Stable for transfer to med-surg.  Goals of care: Despite recovery from volume overload, significant improvement in respiratory failure, and resolution of renal failure, mental status has worsened. Plan is to minimize risk factors for delirium and observe.  - HCPOA is Daughter, Helene Kelp - Palliative care assistance appreciated. Family worried about poor po, interested in discussing enteral feeding options. Consider NGT. - Currently DNR/DNI. - Patient does not like the BiPAP, requested not to be put back  Acute respiratory failure with hypoxia: After the surgery patient spent the night in the step down and required BiPAP and nonrebreather mask transiently - Still on oxygen, 4L, not terribly concerned with weaning - Improving with diuresis due to renal recovery.   Acute on chronic diastolic CHF: Elevated BNP, CXR with vascular congestion. + pitting dependent edema.  - Was not responsive to IV diuretics which were stopped due to renal failure, but diuresis has begun with renal recovery despite taking little po.  Permanent atrial fibrillation/tachybradycardia syndrome s/p pacemaker: Stable. Recent check of PPM showed normal function, not dependent. CHADS2Vasc 7. INR therapeutic at 2.9 on admission. - Switched to eliquis low dose per cardiology. If poor responsiveness continues, will need to decide on IV/[place] anticoagulation. - Rate is controlled without verapamil and digoxin. - Discontinue telemetry monitoring  Acute on stage III chronic  kidney disease: Oliguric, resolving, now improved from presumptive  baseline (cr 1.2 at recent admission) - Good UOP continues despite limited fluids/po - Continue to monitor  Stage II sacral ulcer: I am unaware of presence on arrival. Noninfected - WOC consult - Turn pt as much as possible, though would minimize turning due to significant pain if comfort care is chosen.  Hypokalemia, hypomagnesemia: Acute on chronic, currently resolved, was repleting orally but po has declined so this is discontinued.  - Continue monitoring and replete as needed  Left intertrochanteric hip fracture: due to fall at home. s/p ORIF 9/13 by Dr. Erlinda Hong.  - Pain control as ordered  Essential hypertension - Controlled.  Anemia: Unknown chronicity, but with CKD, likely AOCD in absence of bleeding. Hgb stable since transfusion 9/16. -Monitor daily  DVT prophylaxis: SCD's Code Status: DNR Family Communication: Discussed with Trellis Paganini and caretaker. HCPOA Helene Kelp unreachable by phone this morning. Disposition Plan: Transfer to floor, ongoing observation of mental status.   Consultants:   Orthopedics, Dr. Erlinda Hong  Cardiology, Dr. Sallyanne Kuster  Palliative care team  Procedures:   Foley 9/18  Antimicrobials:   None   Objective:  Vitals:   10/16/15 2342 10/17/15 0359 10/17/15 0454 10/17/15 0801  BP: (!) 128/56 (!) 100/47 (!) 117/51 (!) 135/49  Pulse: (!) 55 (!) 59 62 64  Resp: 18 15 18 15   Temp: 97.8 F (36.6 C) 97.8 F (36.6 C)  (!) 96.9 F (36.1 C)  TempSrc: Axillary Axillary  Axillary  SpO2: 94% 95% 91% 96%  Weight:      Height:        Intake/Output Summary (Last 24 hours) at 10/17/15 1019 Last data filed at 10/17/15 [id number]  Gross per 24 hour  Intake            562.5 ml  Output             1160 ml  Net           -597.5 ml   Filed Weights   10/08/15 0600 10/09/15 0629  Weight: 70.4 kg (155 lb 1.6 oz) 71.7 kg (158 lb)    Examination: General exam: Frail elderly female lying in bed with  mouth open, unaware and unresponsive to surroundings, intermittently mumbling "mama, mama." Respiratory system: Clear to auscultation. Respiratory effort normal on 4L by [place]. Cardiovascular system: S1 & S2 heard, Irregularly irregular. No JVD, II/VI SEM, rubs, gallops or clicks. No pedal edema. V paced on monitor.  Gastrointestinal system: Abdomen is nondistended, soft and nontender. No organomegaly or masses felt. Normal bowel sounds heard. GU: Foley in place. Central nervous system: Not oriented, poorly responsive, not following commands. Extremities: Left hemiparesis, though pt not cooperative with exam. Moving RUE spontaneously. Skin: Sacrum examined showing right buttock shallow wound without significant drainage or odor.  Data Reviewed: I have personally reviewed following labs and imaging studies  CBC:  Recent Labs Lab 10/13/15 0738 10/14/15 0205 10/15/15 0529 10/16/15 0405 10/17/15 0407  WBC 6.4 5.8 5.7 5.8 6.1  HGB 9.5* 8.9* 9.0* 8.3* 9.1*  HCT 31.7* 30.6* 30.3* 28.5* 31.2*  MCV 74.2* 74.8* 73.9* 74.6* 75.2*  PLT 349 331 349 316 [id number]   Basic Metabolic Panel:  Recent Labs Lab 10/11/15 0335 10/13/15 0738 10/14/15 0205 10/15/15 0529 10/16/15 0405 10/17/15 0407  NA 132* 135 135 137 138 141  K 4.4 4.9 4.6 4.4 3.8 3.5  CL 94* 95* 99* 98* 101 102  CO2 27 27 28 29 28 30   GLUCOSE 94 86 83 92 107* 110*  BUN 30* 51* 61*  65* 53* 41*  CREATININE 1.79* 2.20* 2.07* 1.63* 1.28* 1.01*  CALCIUM 8.8* 8.9 8.5* 8.9 8.9 9.2  MG 1.9  --   --   --   --   --    GFR: Estimated Creatinine Clearance: 35.9 mL/min (by C-G formula based on SCr of 1.01 mg/dL (H)). Liver Function Tests: No results for input(s): AST, ALT, ALKPHOS, BILITOT, PROT, ALBUMIN in the last 168 hours. No results for input(s): LIPASE, AMYLASE in the last 168 hours. No results for input(s): AMMONIA in the last 168 hours. Coagulation Profile: No results for input(s): INR, PROTIME in the last 168 hours. Cardiac  Enzymes: No results for input(s): CKTOTAL, CKMB, CKMBINDEX, TROPONINI in the last 168 hours. BNP (last 3 results) No results for input(s): PROBNP in the last 8760 hours. HbA1C: No results for input(s): HGBA1C in the last 72 hours. CBG: No results for input(s): GLUCAP in the last 168 hours. Lipid Profile: No results for input(s): CHOL, HDL, LDLCALC, TRIG, CHOLHDL, LDLDIRECT in the last 72 hours. Thyroid Function Tests: No results for input(s): TSH, T4TOTAL, FREET4, T3FREE, THYROIDAB in the last 72 hours. Anemia Panel: No results for input(s): VITAMINB12, FOLATE, FERRITIN, TIBC, IRON, RETICCTPCT in the last 72 hours.  Sepsis Labs: No results for input(s): PROCALCITON, LATICACIDVEN in the last 168 hours.  Recent Results (from the past 240 hour(s))  MRSA PCR Screening     Status: Abnormal   Collection Time: 10/08/15  3:59 PM  Result Value Ref Range Status   MRSA by PCR POSITIVE (A) NEGATIVE Final    Comment:        The GeneXpert MRSA Assay (FDA approved for NASAL specimens only), is one component of a comprehensive MRSA colonization surveillance program. It is not intended to diagnose MRSA infection nor to guide or monitor treatment for MRSA infections. CRITICAL RESULT CALLED TO, READ BACK BY AND VERIFIED WITH: Stasia Cavalier, RN AT [place] ON 10/08/15 BY C. Griebel, MLT.      Radiology Studies: No results found.  Scheduled Meds: . apixaban  2.5 mg Oral BID  . chlorhexidine  15 mL Mouth Rinse BID  . feeding supplement (PRO-STAT SUGAR FREE 64)  30 mL Oral BID  . lidocaine  1 patch Transdermal Q24H  . mouth rinse  15 mL Mouth Rinse q12n4p  . mupirocin ointment   Nasal BID   Continuous Infusions: . dextrose 5 % and 0.45% NaCl 1,000 mL infusion 50 mL/hr at 10/16/15 2100    LOS: 9 days   Time spent: 35 minutes.  Vance Gather, MD [facility] Pager [phone]  If 7PM-7AM, please contact night-coverage [email or web address] Password [email or web address] 10/17/2015, 10:19 AM

## 2015-10-17 NOTE — Progress Notes (Signed)
Daily Progress Note   Patient Name: [name]       Date: 10/17/2015 DOB: December 17, 1923  Age: 82 y.o. MRN#: [id number] Attending Physician: Patrecia Pour, MD Primary Care Physician: [name], MD Admit Date: 10/08/2015  Reason for Consultation/Follow-up: Establishing goals of care  Subjective: Patient appears lethargic, she does not open eyes, does not follow commands PT is in the room Patient moves her RUE and RLE some at times.  She does not appear to be in distress Son in law at the bedside, detailed discussions ensued, see below:   Length of Stay: 9  Current Medications: Scheduled Meds:  . chlorhexidine  15 mL Mouth Rinse BID  . feeding supplement (PRO-STAT SUGAR FREE 64)  30 mL Oral BID  . lidocaine  1 patch Transdermal Q24H  . mouth rinse  15 mL Mouth Rinse q12n4p  . mupirocin ointment   Nasal BID    Continuous Infusions: . dextrose 5 % and 0.45% NaCl 1,000 mL infusion 50 mL/hr at 10/16/15 2100    PRN Meds: acetaminophen **OR** acetaminophen, alum & mag hydroxide-simeth, haloperidol lactate, HYDROmorphone (DILAUDID) injection, LORazepam, menthol-cetylpyridinium **OR** phenol, methocarbamol **OR** methocarbamol (ROBAXIN)  IV, metoCLOPramide **OR** metoCLOPramide (REGLAN) injection, ondansetron **OR** ondansetron (ZOFRAN) IV  Physical Exam  Constitutional: She is uncooperative.  Does not open eyes to voice command  Cardiovascular: Regular rhythm and normal heart sounds.   Pulmonary/Chest: Effort normal. No accessory muscle usage. No tachypnea. No respiratory distress. She has decreased breath sounds.  Abdominal: Soft. Bowel sounds are normal. She exhibits no distension. There is no tenderness.  Musculoskeletal: She exhibits edema (BLE).  Neurological: She is alert.    Skin: Skin is warm and dry.  Psychiatric: She is agitated. Cognition and memory are impaired. She is noncommunicative.  Constant moaning She is inattentive.  Nursing note and vitals reviewed.           Vital Signs: BP (!) 95/45 (BP Location: Left Arm)   Pulse 60   Temp 97.3 F (36.3 C) (Axillary)   Resp 15   Ht 5\' 4"  (1.626 m)   Wt 158 lb (71.7 kg) Comment: bedscale  SpO2 96%   BMI 27.12 kg/m  SpO2: SpO2: 96 % O2 Device: O2 Device: Nasal Cannula O2 Flow Rate: O2 Flow Rate (L/min): 5 L/min  Intake/output summary:  Intake/Output Summary (Last 24 hours) at 10/17/15 1400 Last data filed at 10/17/15 1308  Gross per 24 hour  Intake            562.5 ml  Output             1010 ml  Net           -447.5 ml   LBM: Last BM Date: 10/17/15 (colostomy putting out) Baseline Weight: Weight: 155 lb 1.6 oz (70.4 kg) Most recent weight: Weight: 158 lb (71.7 kg) (bedscale)       Palliative Assessment/Data: PPS 20%   Flowsheet Rows   Flowsheet Row Most Recent Value  Intake Tab  Referral Department  Hospitalist  Unit at Time of Referral  Intermediate Care Unit  Palliative Care Primary Diagnosis  Cardiac  Date Notified  10/12/15  Palliative Care Type  New Palliative care  Reason for referral  Clarify Goals of Care  Date of Admission  10/08/15  Date first seen by Palliative Care  10/14/15  # of days Palliative referral response time  2 Day(s)  # of days IP prior to Palliative referral  4  Clinical Assessment  Palliative Performance Scale Score  20%  Pain Max last 24 hours  Not able to report  Pain Min Last 24 hours  Not able to report  Psychosocial & Spiritual Assessment  Palliative Care Outcomes  Patient/Family meeting held?  Yes  Who was at the meeting?  Patient, 2 daughters  Palliative Care Outcomes  Clarified goals of care      Patient Active Problem List   Diagnosis Date Noted  . Acute renal failure ([place])   . Acute encephalopathy   . Palliative care encounter   .  Anxiety state   . Acute pain of lower extremity   . Difficulty walking   . CHF (congestive heart failure) ([place])   . Acute and chronic respiratory failure with hypoxia ([place]) 10/10/2015  . Hip fracture, left ([place]) 10/08/2015  . Closed left hip fracture ([place]) 10/08/2015  . Anemia 10/08/2015  . Fall 10/08/2015  . Colostomy in place [facility]) 09/25/2015  . SBO (small bowel obstruction) ([place]) 09/24/2015  . Gastrointestinal hemorrhage with melena   . Subtherapeutic anticoagulation   . Severe malnutrition ([place]) 06/20/2015  . Acute deep vein thrombosis (DVT) of brachial vein of right upper extremity ([place])   . Localized edema   . Hypokalemia   . Hypomagnesemia   . Debilitated 06/14/2015  . History of creation of ostomy 06/14/2015  . Abdominal pain   . Chronic diastolic congestive heart failure ([place])   . History of CVA with residual deficit   . Respiratory depression   . Stool culture positive for Clostridium difficile   . On total parenteral nutrition (TPN)   . Urinary retention   . Leukocytosis   . Tachycardia   . Normocytic anemia   . Acute kidney injury superimposed on chronic kidney disease ([place])   . Status post partial colectomy   . Ileus, postoperative   . Postoperative pain   . Diverticulitis of large intestine with perforation and abscess without bleeding   . CKD (chronic kidney disease) stage 3, GFR 30-59 ml/min 05/26/2015  . HOH (hard of hearing) 05/26/2015  . Protein-calorie malnutrition, moderate ([place]) 05/26/2015  . Diverticulitis of intestine with perforation and abscess 05/25/2015  . Diverticulosis   . Chronic thoracic back pain   . Acute on chronic diastolic (congestive) heart failure ([place])   . Perforation of intestine due to  diverticulitis of gastrointestinal tract ([place])   . SOB (shortness of breath) 05/01/2015  . Supratherapeutic INR   . Paroxysmal atrial fibrillation (HCC)   . Essential hypertension, benign   . Left sided abdominal pain   . Diverticulitis  03/08/2015  . Obesity   . Chronic atrial fibrillation ([place])   . Upper airway cough syndrome   . Dyspnea 05/22/2014  . Tachycardia-bradycardia syndrome with symptomatic bradycardia 01/03/2014  . Goals of care, counseling/discussion 12/28/2013  . History of CVA (cerebrovascular accident) 10/25/2013  . Diabetes ([place]) 08/03/2013  . Myofacial muscle pain 10/16/2011  . Back pain, Left Flank 07/01/2011  . Cerebral embolism with cerebral infarction ([place]) 06/12/2011  . CVA (cerebral infarction) 05/20/2011  . Weakness of left side of body 05/16/2011  . Elevated digoxin level 05/16/2011  . Pacemaker 05/21/2010  . Edema 04/24/2010  . Pericardial effusion, Large   . Chronic anticoagulation - Coumadin, CHADS2VASC=7   . HTN (hypertension)   . Permanent atrial fibrillation ([place]) 04/14/2010    Palliative Care Assessment & Plan   Patient Profile: 82 year old female with a PMH of chronic A. fib on Coumadin, chronic diastolic CHF, 2-D echo [id number] with LVEF 55-60 percent, HTN, pericardial effusion status post pericardial window 01/16/15, stroke, tachybradycardia syndrome status post pacemaker, anemia, status post recent hospitalization 09/24/15-09/30/15 for urgent sigmoid colectomy with Hartmann's procedure and small bowel resection on 05/31/15 following which transferred to inpatient rehabilitation and then transitioned home, awaiting colostomy reversal/repair of parastomal hernia, presented to ED on 10/08/15 following a mechanical fall at home and now s/p hip fx repair.  Following surgery, she has had likely CHF exacerbation with poor urine output and increasing Cr with attempted diuresis.  Palliative consulted for [place]. New onset agitation. Patient may have had another stroke-no function or motor strength on left side per NP assessment and family.   Assessment: Fall Closed left hip fracture Acute respiratory failure with hypoxia Acute on chronic diastolic CHF Paroxysmal atrial fibrillation Sick sinus  syndrome s/p pacemaker Acute on chronic kidney disease  Recommendations/Plan:  Family wishes to continue with current mode of care, they don't want her to have NGT, no tube feeds. Family does not think the patient will tolerate NGT placement, discussed about careful comfort feeds and IVF for now. Agree with transfer out of 3S if possible today.   DNR/DNI  Family understands severity of condition but would like to continue watchful waiting. Patient has shown improvement in urine output and kidney function, but displaying change in mental status and new onset left hemiplegia (? CVA). She would most likely not benefit from full stroke workup at this point.   Continue other prn's (xanax, flexeril, robaxin) as needed and if patient awake/alert enough to swallow.   PMT will continue to follow up with patient and family regarding goals of care during hospitalization. Discussed frankly with son in law present in the room regarding the patient's multiple acute and underlying conditions that place her at high risk for lack of meaningful improvement and even death.   Goals of Care and Additional Recommendations:  Limitations on Scope of Treatment: Full Scope Treatment  Code Status: DNR   Code Status Orders        Start     Ordered   10/11/15 [id number]  Do not attempt resuscitation (DNR)  Continuous    Question Answer Comment  In the event of cardiac or respiratory ARREST Do not call a "code blue"   In the event of cardiac or respiratory ARREST Do not perform  Intubation, CPR, defibrillation or ACLS   In the event of cardiac or respiratory ARREST Use medication by any route, position, wound care, and other measures to relive pain and suffering. May use oxygen, suction and manual treatment of airway obstruction as needed for comfort.      10/11/15 1653    Code Status History    Date Active Date Inactive Code Status Order ID Comments User Context   10/08/2015  5:09 AM 10/11/2015  4:53 PM Full Code  [id number]  Norval Morton, MD ED   09/24/2015 11:05 PM 09/30/2015  2:41 PM Full Code [id number]  Edwin Dada, MD Inpatient   06/14/2015  5:00 PM 06/26/2015  1:42 PM Full Code [id number]  Lavon Paganini Socorro, PA-C Inpatient   05/25/2015  9:27 PM 06/14/2015  5:00 PM Full Code [id number]  Olam Idler, MD Inpatient   05/01/2015  8:05 PM 05/08/2015  7:13 PM Full Code [id number]  Mercy Riding, MD Inpatient   04/03/2015  7:31 PM 04/11/2015  4:53 PM Full Code [id number]  Rogue Bussing, MD ED   03/08/2015  8:51 PM 03/09/2015  8:06 PM Full Code [id number]  Frazier Richards, MD Inpatient   01/16/2015 11:49 AM 01/26/2015  6:19 PM Full Code [id number]  Nani Skillern, PA-C Inpatient   01/15/2015 11:13 AM 01/16/2015 11:49 AM Full Code [id number]  Lonn Georgia, PA-C Inpatient   01/02/2014  4:24 PM 01/03/2014  1:35 PM Full Code [id number]  Sanda Klein, MD Inpatient   06/21/2011  8:59 AM 06/24/2011  4:38 PM Partial Code [id number]  Carolin Guernsey, MD Inpatient   06/20/2011  4:45 PM 06/21/2011  8:59 AM Partial Code [id number]  Angelica Ran, MD Inpatient   05/16/2011  5:59 PM 05/20/2011  5:57 PM Partial Code [id number]  Carlynn Spry, RN Inpatient    Advance Directive Documentation   Flowsheet Row Most Recent Value  Type of Advance Directive  Healthcare Power of Attorney, Living will  Pre-existing out of facility DNR order (yellow form or pink MOST form)  No data  "MOST" Form in Place?  No data       Prognosis:   Unable to determine-guarded. High risk for ongoing decline. Cautiously monitoring trajectory.   Discharge Planning:  To Be Determined  Care plan was discussed with   Son-in-law,PT Thank you for allowing the Palliative Medicine Team to assist in the care of this patient.   Time In: 1300 Time Out: 1335 Total Time 88min Prolonged Time Billed  no       Greater than 50%  of this time was spent counseling and coordinating care related to the above assessment and plan.   Loistine Chance  MD [place] [facility] [phone]   Please contact Palliative Medicine Team phone at [phone] for questions and concerns.

## 2015-10-17 NOTE — Progress Notes (Addendum)
ANTICOAGULATION CONSULT NOTE - Initial Consult  Pharmacy Consult for heparin Indication: atrial fibrillation  Allergies  Allergen Reactions  . Anti-Inflammatory Enzyme [Nutritional Supplements] Other (See Comments)    Retains fluids and headaches  . Iodinated Diagnostic Agents Hives    HIVES 15MIN S/P IV CONTRAST INJECTION,WILL NEED 13 HR PREP FOR FUTURE INJECTIONS, ok s/p 50mg  po benadryl//a.calhoun  . Spironolactone Other (See Comments)    Hair loss  . Arthrotec [Diclofenac-Misoprostol] Other (See Comments)    unknown  . Biaxin [Clarithromycin] Other (See Comments)    unknown  . Plavix [Clopidogrel Bisulfate] Other (See Comments)    unknown    Patient Measurements: Height: 5\' 4"  (162.6 cm) Weight: 158 lb (71.7 kg) (bedscale) IBW/kg (Calculated) : 54.7 Heparin Dosing Weight:  69kg  Vital Signs: Temp: 97.2 F (36.2 C) (09/21 1107) Temp Source: Axillary (09/21 1107) BP: 135/49 (09/21 0801) Pulse Rate: 64 (09/21 0801)  Labs:  Recent Labs  10/15/15 0529 10/16/15 0405 10/17/15 0407  HGB 9.0* 8.3* 9.1*  HCT 30.3* 28.5* 31.2*  PLT 349 316 316  CREATININE 1.63* 1.28* 1.01*    Estimated Creatinine Clearance: 35.9 mL/min (by C-G formula based on SCr of 1.01 mg/dL (H)).   Medical History: Past Medical History:  Diagnosis Date  . Anemia   . Arthritis   . Chronic atrial fibrillation (HCC)    a. on Coumadin, Digoxin, and Verapamil  . Chronic diastolic (congestive) heart failure ([place])    a. Echo 01/2015 with EF of 55-60%.  . Hip fracture, left ([place]) 09/2015   CLOSED  . History of GI bleed    a. secondary to AVM's. Treated with Fe infusion  . History of shingles   . HTN (hypertension)   . LVH (left ventricular hypertrophy)   . Obesity   . Pericardial effusion    a. s/p pericardial window 01/16/15  . Positive TB test   . Sleep apnea    mild-no cpap  . Stroke [facility])    a. 2013: right frontal  . Tachycardia-bradycardia syndrome ([place])    a. s/p Medtronic [place],  model number [id number], serial number [id number] H 12/2013    Medications:  Scheduled:  . chlorhexidine  15 mL Mouth Rinse BID  . feeding supplement (PRO-STAT SUGAR FREE 64)  30 mL Oral BID  . lidocaine  1 patch Transdermal Q24H  . mouth rinse  15 mL Mouth Rinse q12n4p  . mupirocin ointment   Nasal BID    Assessment: 82 yo female with afib and CVA with acute encephalopathy. She has been on apixiban but has not been able to take po medications (last apixiban dose was 9/19).  Pharmacy consulted to dose heparin.  -Hg= 9.1, plt= 316  Goal of Therapy:  Heparin level 0.3-0.5 units/ml aPTT 66-85 seconds Monitor platelets by anticoagulation protocol: Yes   Plan:  -Heparin level now to determine if there is need for monitoring with aPTTs -Once heparin level is collected, begin heparin at 800 units/hr -heparin level in 8 hrs and daily with CBC daily  Hildred Laser, Pharm D 10/17/2015 12:49 PM  Addendum -heparin level has not been collected  Plan -Begin heparin now  Hildred Laser, Pharm D 10/17/2015 3:37 PM

## 2015-10-17 NOTE — Progress Notes (Signed)
Patient Name: [name] Date of Encounter: 10/17/2015  Principal Problem:   Closed left hip fracture [facility]) Active Problems:   Pacemaker   Paroxysmal atrial fibrillation (HCC)   Hypokalemia   Anemia   Fall   Acute and chronic respiratory failure with hypoxia (HCC)   CHF (congestive heart failure) (HCC)   Acute pain of lower extremity   Difficulty walking   Palliative care encounter   Anxiety state   Acute renal failure ([place])   Acute encephalopathy   Length of Stay: 9  SUBJECTIVE  More comprehensible speech today, c/o dry mouth.   CURRENT MEDS . apixaban  2.5 mg Oral BID  . chlorhexidine  15 mL Mouth Rinse BID  . feeding supplement (PRO-STAT SUGAR FREE 64)  30 mL Oral BID  . lidocaine  1 patch Transdermal Q24H  . mouth rinse  15 mL Mouth Rinse q12n4p  . mupirocin ointment   Nasal BID    OBJECTIVE   Intake/Output Summary (Last 24 hours) at 10/17/15 1059 Last data filed at 10/17/15 0803  Gross per 24 hour  Intake            562.5 ml  Output             1160 ml  Net           -597.5 ml   Filed Weights   10/08/15 0600 10/09/15 0629  Weight: 155 lb 1.6 oz (70.4 kg) 158 lb (71.7 kg)    PHYSICAL EXAM Vitals:   10/16/15 2342 10/17/15 0359 10/17/15 0454 10/17/15 0801  BP: (!) 128/56 (!) 100/47 (!) 117/51 (!) 135/49  Pulse: (!) 55 (!) 59 62 64  Resp: 18 15 18 15   Temp: 97.8 F (36.6 C) 97.8 F (36.6 C)  (!) 96.9 F (36.1 C)  TempSrc: Axillary Axillary  Axillary  SpO2: 94% 95% 91% 96%  Weight:      Height:       General: Groggy, but more comprehensible speech. PERRL, EOMI, no exophtalmos or lid lag, no myxedema, no xanthelasma; normal ears, nose and oropharynx Neck: 6-7 cm jugular venous pulsations and big V waves; brisk carotid pulses without delay and no carotid bruits Chest: clear to auscultation anteriorly Cardiovascular: normal position and quality of the apical impulse, regular rhythm, normal first and paradoxically split second heart sounds, no  rubs or gallops, 3/6 holosystolic murmur Abdomen: no tenderness or distention, no masses by palpation, no abnormal pulsatility or arterial bruits, normal bowel sounds, no hepatosplenomegaly. Colostomy without blood Extremities: no clubbing, cyanosis or edema;  Neurological: limited exam, seems to have worsened left upper and lower extremity paresis. Exam limited by decreased level of consciousness and unable to follow commands.  LABS  CBC  Recent Labs  10/16/15 0405 10/17/15 0407  WBC 5.8 6.1  HGB 8.3* 9.1*  HCT 28.5* 31.2*  MCV 74.6* 75.2*  PLT 316 [id number]   Basic Metabolic Panel  Recent Labs  10/16/15 0405 10/17/15 0407  NA 138 141  K 3.8 3.5  CL 101 102  CO2 28 30  GLUCOSE 107* 110*  BUN 53* 41*  CREATININE 1.28* 1.01*  CALCIUM 8.9 9.2   Liver Function Tests No results for input(s): AST, ALT, ALKPHOS, BILITOT, PROT, ALBUMIN in the last 72 hours.  Radiology Studies Imaging results have been reviewed and No results found.  TELE AFib, intermittent V sensing/V pacing   ASSESSMENT AND PLAN  1. Acute on chronic diastolic heart failure: improving despite being off diuretics as renal function  continues to recover. Appears to be euvolemic, no overt edema noted.  2. Acute renal failure, oliguric: Now stable. Eliquis is currently at low dose b/c of her initial elevated Cr. Could increase now, but does not appear to taking in POs at this time.  3. AFib, permanent: less V pacing afterstoppingverapamil. She had an embolic stroke when warfarin was subtherapeutic in the past. Her warfarin was reversed to allow hip surgery, now on Eliquis. Of note, does not appear that she taking in POs at this time. May need to transition to IV or SQ anticoagulation? 4. Pericardial effusion: by recent echo located exclusively posterior to the heart and relatively small in overall volume. Itdoes not appear to be of any hemodynamic consequence at this time. 5. Anemia: Improved today 6. AS: Mild and  unlikely to be clinically relevant in the near future. 7. TR: She has severe tricuspid regurgitation and at least moderate pulmonary artery hypertension. Most likely this is related to chronic diastolic heart failure, but may make her sensitive to aggressive volume shifts (for example hypotension with rapid diuresis). 8. Pacemaker: Normal device function atrecent check. She is not pacemaker dependent.  -- Appears plan will be to transfer out to med-surg unit today.   Reino Bellis, NP-C 10/17/2015 10:59 AM   * Discussed with Attending regarding patient not taking in PO meds, Will place a consult to pharmacy for IV heparin given she risk factors.   I have seen and examined the patient along with Reino Bellis, NP-C.  I have reviewed the chart, notes and new data.  I agree with NP's note.  Key new complaints: poorly responsive by the time of my exam Key examination changes: deteriorating oxygenation. Nurse suctioned food pooled in her throat from much earlier in the day. Still with severe weakness on left side Key new findings / data: normalized renal function  PLAN: Family asking about a feeding tube. I think her prognosis is dismal. Even if she survives the acute illness, her rehab potential is very poor. I do not think she will ever be able to walk again. As I have come to know her, she would be against living with a feeding tube. She was unhappy with a colostomy and was planning surgerical reversal. I have recommended that they consider full palliative care. The family is waiting for her daughter Helene Kelp to return from [place]. Two of her daughters share medical POA. Unable to swallow - will switch to [place] enoxaparin. She would need more IV access to start IV heparin.  Sanda Klein, MD, [place] [phone] 10/17/2015, 4:11 PM'

## 2015-10-17 NOTE — Care Management Important Message (Signed)
Important Message  Patient Details  Name: [name] MRN: [id number] Date of Birth: 19-Dec-1923   Medicare Important Message Given:  Yes    Zenon Mayo, RN 10/17/2015, 12:47 [place] Message  Patient Details  Name: [name] MRN: [id number] Date of Birth: Feb 23, 1923   Medicare Important Message Given:  Yes    Zenon Mayo, RN 10/17/2015, 12:47 PM

## 2015-10-17 NOTE — Progress Notes (Signed)
ANTICOAGULATION CONSULT NOTE - Initial Consult  Pharmacy Consult for lovenox Indication: atrial fibrillation  Allergies  Allergen Reactions  . Anti-Inflammatory Enzyme [Nutritional Supplements] Other (See Comments)    Retains fluids and headaches  . Iodinated Diagnostic Agents Hives    HIVES 15MIN S/P IV CONTRAST INJECTION,WILL NEED 13 HR PREP FOR FUTURE INJECTIONS, ok s/p 50mg  po benadryl//a.calhoun  . Spironolactone Other (See Comments)    Hair loss  . Arthrotec [Diclofenac-Misoprostol] Other (See Comments)    unknown  . Biaxin [Clarithromycin] Other (See Comments)    unknown  . Plavix [Clopidogrel Bisulfate] Other (See Comments)    unknown    Patient Measurements: Height: 5\' 4"  (162.6 cm) Weight: 158 lb (71.7 kg) (bedscale) IBW/kg (Calculated) : 54.7 Heparin Dosing Weight:  69kg  Vital Signs: Temp: 97.3 F (36.3 C) (09/21 1450) Temp Source: Axillary (09/21 1450) BP: 108/44 (09/21 1450) Pulse Rate: 50 (09/21 1512)  Labs:  Recent Labs  10/15/15 0529 10/16/15 0405 10/17/15 0407  HGB 9.0* 8.3* 9.1*  HCT 30.3* 28.5* 31.2*  PLT 349 316 316  CREATININE 1.63* 1.28* 1.01*    Estimated Creatinine Clearance: 35.9 mL/min (by C-G formula based on SCr of 1.01 mg/dL (H)).   Medical History: Past Medical History:  Diagnosis Date  . Anemia   . Arthritis   . Chronic atrial fibrillation (HCC)    a. on Coumadin, Digoxin, and Verapamil  . Chronic diastolic (congestive) heart failure ([place])    a. Echo 01/2015 with EF of 55-60%.  . Hip fracture, left ([place]) 09/2015   CLOSED  . History of GI bleed    a. secondary to AVM's. Treated with Fe infusion  . History of shingles   . HTN (hypertension)   . LVH (left ventricular hypertrophy)   . Obesity   . Pericardial effusion    a. s/p pericardial window 01/16/15  . Positive TB test   . Sleep apnea    mild-no cpap  . Stroke [facility])    a. 2013: right frontal  . Tachycardia-bradycardia syndrome ([place])    a. s/p Medtronic [place],  model number [id number], serial number [id number] 12/2013    Medications:  Scheduled:  . chlorhexidine  15 mL Mouth Rinse BID  . enoxaparin (LOVENOX) injection  1 mg/kg Subcutaneous Q12H  . feeding supplement (PRO-STAT SUGAR FREE 64)  30 mL Oral BID  . lidocaine  1 patch Transdermal Q24H  . mouth rinse  15 mL Mouth Rinse q12n4p  . mupirocin ointment   Nasal BID    Assessment: 82 yo female with afib and CVA with acute encephalopathy. Apixaban PTA however, unable to take PO medications (received 3 doses 9/18-9/19). Heparin ordered, but not administered - to now transition to lovenox. H/H low stable - 9.1/31.2. Plt WNL. SCr trending down - CrCl ~35 mL/min.    Plan:  -Lovenox 70mg  q12h  -Monitor renal fcn closely; may need adjustment to q24h -Monitor CBC, s/sx bleeding   Stephens November, [name] Clinical Pharmacist 4:32 PM, 10/17/2015

## 2015-10-17 NOTE — Progress Notes (Signed)
Physical Therapy Treatment Patient Details Name: [name] MRN: [id number] DOB: [birth year] Today's Date: 10/17/2015    History of Present Illness 82 y.o. female with medical history significant of anemia, A. fib on chronic anticoagulation of Coumadin, chronic diastolic CHF (last echo in 01/2015 with EF of 55-60%), chronic pericardial effusion, recurrent diverticulitis s/p colostomy; who presented to the ED after having a fall. She sustained a L hip fx and underwent IM nailing 10/09/15.  Suspected post op CVA with increased left side hemiperesis.  Palliative care involved.      PT Comments    Pt was able to tolerate sitting EOB for ~10 mins with VSS on 5 L O2 [place].  Pt kept eyes closed throughout the session and despite actively moving right arm and leg, she would not do so to command.  She does seem to understand when her son in law asks her if she wants ice chips she nods yes and then chews them up and swallows.  I educated son in law on appropriate ROM activities he can do with the legs. These will need to be reviewed.  BP supine: 126/53, seated: 136/57, seated at 10 mins: 133/57 HR 60 bpm and O2 sats 95% on 5 L O2 .   Follow Up Recommendations  SNF;Supervision/Assistance - 24 hour     Equipment Recommendations  Wheelchair (measurements PT);Wheelchair cushion (measurements PT);Hospital bed;Other (comment) (hoyer lift)    Recommendations for Other Services   NA      Precautions / Restrictions Precautions Precautions: Fall Precaution Comments: pain in back, left sided weakness.     Mobility  Bed Mobility Overal bed mobility: +2 for physical assistance;Needs Assistance Bed Mobility: Supine to Sit;Sit to Supine;Rolling Rolling: +2 for safety/equipment;Total assist   Supine to sit: HOB elevated;+2 for physical assistance;Total assist Sit to supine: +2 for physical assistance;Total assist   General bed mobility comments: Two person total assist to get to EOB and back into bed  assisting with progression of bil legs over EOB, and transition of trunk up and down.  Despite some active movement observed in right arm and leg, pt was not able to help with transitions.   Transfers                 General transfer comment: unable at this time.   Modified Rankin (Stroke Patients Only) Modified Rankin (Stroke Patients Only) Pre-Morbid Rankin Score: Severe disability (due to recent L hip fx) Modified Rankin: Severe disability     Balance Overall balance assessment: Needs assistance Sitting-balance support: Feet supported;Single extremity supported Sitting balance-Leahy Scale: Zero Sitting balance - Comments: up to total assist EOB at times pt pushing with right arm back  Postural control: Posterior lean                          Cognition Arousal/Alertness: Lethargic Behavior During Therapy: Flat affect;Restless Overall Cognitive Status: Difficult to assess Area of Impairment: Following commands       Following Commands: Follows one step commands inconsistently       General Comments: Pt not following commands at all, however, when asked by son in law if she wants ice chips she nods and seems to say yes, then chews and swallows the ice chip.      Exercises General Exercises - Lower Extremity Ankle Circles/Pumps: PROM;Both;10 reps Long Arc Quad: PROM;Both;10 reps;Seated Heel Slides: PROM;Both;10 reps Hip ABduction/ADduction: PROM;Both;10 reps        Pertinent Vitals/Pain Pain  Assessment: Faces Faces Pain Scale: Hurts even more Pain Location: low back, left leg? difficult to assess due to lack of verbalization Pain Descriptors / Indicators: Grimacing;Restless           PT Goals (current goals can now be found in the care plan section) Acute Rehab PT Goals Patient Stated Goal: not stated, family would like for PT to continue.  Progress towards PT goals: Not progressing toward goals - comment (pt continues to have limited  participation)    Frequency    Min 3X/week      PT Plan Current plan remains appropriate       End of Session Equipment Utilized During Treatment: Oxygen (5 L O2 [place]) Activity Tolerance: Patient limited by lethargy;Patient limited by pain Patient left: in bed;with call bell/phone within reach;with family/visitor present     Time: [id number] PT Time Calculation (min) (ACUTE ONLY): 52 min  Charges:  $Therapeutic Exercise: 8-22 mins $Therapeutic Activity: 23-37 mins            Mayrani Khamis B. Juncal, [place], DPT [phone]   10/17/2015, 2:11 PM

## 2015-10-18 DIAGNOSIS — D638 Anemia in other chronic diseases classified elsewhere: Secondary | ICD-10-CM

## 2015-10-18 LAB — BASIC METABOLIC PANEL
ANION GAP: 10 (ref 5–15)
BUN: 31 mg/dL — AB (ref 6–20)
CHLORIDE: 101 mmol/L (ref 101–111)
CO2: 31 mmol/L (ref 22–32)
Calcium: 9.1 mg/dL (ref 8.9–10.3)
Creatinine, Ser: 0.98 mg/dL (ref 0.44–1.00)
GFR calc Af Amer: 57 mL/min — ABNORMAL LOW (ref >60)
GFR, EST NON AFRICAN AMERICAN: 49 mL/min — AB (ref >60)
GLUCOSE: 114 mg/dL — AB (ref 65–99)
POTASSIUM: 3.2 mmol/L — AB (ref 3.5–5.1)
Sodium: 142 mmol/L (ref 135–145)

## 2015-10-18 LAB — CBC
HEMATOCRIT: 33 % — AB (ref 36.0–46.0)
HEMOGLOBIN: 9.5 g/dL — AB (ref 12.0–15.0)
MCH: 21.8 pg — AB (ref 26.0–34.0)
MCHC: 28.8 g/dL — AB (ref 30.0–36.0)
MCV: 75.9 fL — ABNORMAL LOW (ref 78.0–100.0)
Platelets: 332 10*3/uL (ref 150–400)
RBC: 4.35 MIL/uL (ref 3.87–5.11)
RDW: 18.6 % — ABNORMAL HIGH (ref 11.5–15.5)
WBC: 7.1 10*3/uL (ref 4.0–10.5)

## 2015-10-18 LAB — MAGNESIUM: Magnesium: 2.6 mg/dL — ABNORMAL HIGH (ref 1.7–2.4)

## 2015-10-18 MED ORDER — LORAZEPAM 2 MG/ML IJ SOLN
1.0000 mg | INTRAMUSCULAR | Status: DC | PRN
  Administered 2015-10-18 – 2015-10-21 (×9): 1 mg via INTRAVENOUS
  Filled 2015-10-18 (×9): qty 1

## 2015-10-18 MED ORDER — SODIUM CHLORIDE 0.9 % IV SOLN
0.5000 mg/h | INTRAVENOUS | Status: DC
  Administered 2015-10-18 (×2): 0.5 mg/h via INTRAVENOUS
  Filled 2015-10-18: qty 5

## 2015-10-18 MED ORDER — POTASSIUM CHLORIDE 10 MEQ/100ML IV SOLN
10.0000 meq | INTRAVENOUS | Status: AC
  Administered 2015-10-18 (×4): 10 meq via INTRAVENOUS
  Filled 2015-10-18: qty 100

## 2015-10-18 MED ORDER — HYDROMORPHONE BOLUS VIA INFUSION
1.0000 mg | INTRAVENOUS | Status: DC | PRN
  Administered 2015-10-18 – 2015-10-19 (×4): 1 mg via INTRAVENOUS
  Administered 2015-10-19: 1 mg/h via INTRAVENOUS
  Administered 2015-10-19 – 2015-10-21 (×12): 1 mg via INTRAVENOUS
  Filled 2015-10-18: qty 1

## 2015-10-18 NOTE — Progress Notes (Signed)
Transitioning to full palliative care. Anticoagulation is not contributing to symptom relief and will be discontinued.

## 2015-10-18 NOTE — Progress Notes (Addendum)
Patient ID: [name], female   DOB: 11/16/1923, 82 y.o.   MRN: [id number]  PROGRESS NOTE    [name]  [id number] DOB: 1923/04/26 DOA: 10/08/2015  PCP: [name], MD   Brief Narrative:  82 year old female with past medical history significant for chronic atrial fibrillation, was on Coumadin but currently on Lovenox subcutaneous, chronic diastolic congestive heart failure with 2-D echo in January 2017 with ejection fraction of 55%, hypertension, pericardial effusion status post pericardial window 01/16/2015, history of CVA, tachycardia bradycardia syndrome status post pacemaker, recent hospitalization 09/24/15-09/30/15 for urgent sigmoid colectomy with Hartmann's procedure and small bowel resection on 05/31/15 following which she was transferred to inpatient rehabilitation and then transitioned home, awaiting colostomy reversal/repair of parastomal hernia.  Patient presents to [facility] 10/08/2015 following a mechanical fall at home and sustaining left hip pain. She was found to have left intertrochanteric hip fracture. She underwent ORIF by Dr. Erlinda Hong [id number] without complications. Unfortunately her hospital course was complicated with worsening respiratory status requiring BiPAP. Her mental status has slowly been declining. Palliative care was consulted for goals of care.  Assessment & Plan:   Acute metabolic encephalopathy - No significant changes in mental status. The thought is that she probably has had ischemic stroke but we can't do MRI because of pacemaker - Palliative care consulted for goals of care and we appreciated their assistance - Continue supportive care   Acute respiratory failure with hypoxia /Acute on chronic diastolic CHF - Thought to be secondary to volume overload - Patient was not responsive to IV diuretics and eventually there were stopped because of renal failure. Diuresis then reinitiated with renal recovery - Not on any diuretics at this time    Permanent atrial fibrillation / tachybradycardia syndrome s/p pacemaker - CHADS2Vasc 7. - On Lovenox subQ  - Rate controlled without CCB or BB  Acute on chronic stage III chronic kidney disease - Cr now WNL  Stage II sacral ulcer - Per RN care   Hypokalemia, hypomagnesemia - Supplemented - Potassium still low this am so supplemented again - Magnesium 2.6 today   Left intertrochanteric hip fracture - Ss/p ORIF 9/13 by Dr. Erlinda Hong.   Essential hypertension - BP 127/48; at goal   Anemia of chronic disease - Due to CKD - Hgb stable at 9.5    DVT prophylaxis: SCDs bilaterally; Lovenox subcutaneous therapeutic dose Code Status: DNR/DNI Family Communication: Caregiver at bedside Disposition Plan: Depending on palliative care recommendations, skilled nursing versus hospice placement   Consultants:   Orthopedics, Dr. Erlinda Hong  Cardiology, Dr. Sallyanne Kuster  Palliative care team  Procedures:   Foley catheter 10/14/2015  Antimicrobials:   None   Subjective: No overnight events.  Objective: Vitals:   10/17/15 2335 10/18/15 0406 10/18/15 0555 10/18/15 0732  BP: (!) 126/52 (!) 106/50 (!) 126/50 (!) 127/48  Pulse: (!) 58 60 60   Resp: 17 16 18 16   Temp: 97.7 F (36.5 C) 97.4 F (36.3 C)  97.9 F (36.6 C)  TempSrc: Axillary Axillary  Axillary  SpO2: 93% 96% 95%   Weight:      Height:        Intake/Output Summary (Last 24 hours) at 10/18/15 0937 Last data filed at 10/18/15 0733  Gross per 24 hour  Intake              550 ml  Output             1085 ml  Net             -  535 ml   Filed Weights   10/08/15 0600 10/09/15 0629  Weight: 70.4 kg (155 lb 1.6 oz) 71.7 kg (158 lb)    Examination:  General exam: Appears calm, does not respond to verbal stimuli  Respiratory system: Diminished breath sounds, no wheezing  Cardiovascular system: S1 & S2 heard, Rate controlled  Gastrointestinal system: Abdomen is nondistended, soft and nontender. No organomegaly or  masses felt. Normal bowel sounds heard. Central nervous system: Not responding to verbal stimuli  Extremities: RUE more swollen than left; palpable pulses  Skin: No rashes, lesions or ulcers Psychiatry: Unable to assess mental status due to altered mental status  Data Reviewed: I have personally reviewed following labs and imaging studies  CBC:  Recent Labs Lab 10/14/15 0205 10/15/15 0529 10/16/15 0405 10/17/15 0407 10/18/15 0505  WBC 5.8 5.7 5.8 6.1 7.1  HGB 8.9* 9.0* 8.3* 9.1* 9.5*  HCT 30.6* 30.3* 28.5* 31.2* 33.0*  MCV 74.8* 73.9* 74.6* 75.2* 75.9*  PLT 331 349 316 316 [id number]   Basic Metabolic Panel:  Recent Labs Lab 10/14/15 0205 10/15/15 0529 10/16/15 0405 10/17/15 0407 10/18/15 0505  NA 135 137 138 141 142  K 4.6 4.4 3.8 3.5 3.2*  CL 99* 98* 101 102 101  CO2 28 29 28 30 31   GLUCOSE 83 92 107* 110* 114*  BUN 61* 65* 53* 41* 31*  CREATININE 2.07* 1.63* 1.28* 1.01* 0.98  CALCIUM 8.5* 8.9 8.9 9.2 9.1  MG  --   --   --   --  2.6*   GFR: Estimated Creatinine Clearance: 37 mL/min (by C-G formula based on SCr of 0.98 mg/dL). Liver Function Tests: No results for input(s): AST, ALT, ALKPHOS, BILITOT, PROT, ALBUMIN in the last 168 hours. No results for input(s): LIPASE, AMYLASE in the last 168 hours. No results for input(s): AMMONIA in the last 168 hours. Coagulation Profile: No results for input(s): INR, PROTIME in the last 168 hours. Cardiac Enzymes: No results for input(s): CKTOTAL, CKMB, CKMBINDEX, TROPONINI in the last 168 hours. BNP (last 3 results) No results for input(s): PROBNP in the last 8760 hours. HbA1C: No results for input(s): HGBA1C in the last 72 hours. CBG: No results for input(s): GLUCAP in the last 168 hours. Lipid Profile: No results for input(s): CHOL, HDL, LDLCALC, TRIG, CHOLHDL, LDLDIRECT in the last 72 hours. Thyroid Function Tests: No results for input(s): TSH, T4TOTAL, FREET4, T3FREE, THYROIDAB in the last 72 hours. Anemia Panel: No  results for input(s): VITAMINB12, FOLATE, FERRITIN, TIBC, IRON, RETICCTPCT in the last 72 hours. Urine analysis:    Component Value Date/Time   COLORURINE RED (A) 10/09/2015 1141   APPEARANCEUR CLOUDY (A) 10/09/2015 1141   LABSPEC 1.013 10/09/2015 1141   PHURINE 5.5 10/09/2015 1141   GLUCOSEU NEGATIVE 10/09/2015 1141   HGBUR LARGE (A) 10/09/2015 1141   BILIRUBINUR NEGATIVE 10/09/2015 1141   BILIRUBINUR negative 02/21/2015 1203   BILIRUBINUR Negative 07/17/2014 1207   KETONESUR NEGATIVE 10/09/2015 1141   PROTEINUR 100 (A) 10/09/2015 1141   UROBILINOGEN 0.2 02/21/2015 1203   UROBILINOGEN 0.2 07/02/2011 0443   NITRITE NEGATIVE 10/09/2015 1141   LEUKOCYTESUR SMALL (A) 10/09/2015 1141   Sepsis Labs: @LABRCNTIP (procalcitonin:4,lacticidven:4)    Recent Results (from the past 240 hour(s))  MRSA PCR Screening     Status: Abnormal   Collection Time: 10/08/15  3:59 PM  Result Value Ref Range Status   MRSA by PCR POSITIVE (A) NEGATIVE Final      Radiology Studies: No results found.    Scheduled  Meds: . chlorhexidine  15 mL Mouth Rinse BID  . enoxaparin (LOVENOX) injection  1 mg/kg Subcutaneous Q12H  . feeding supplement (PRO-STAT SUGAR FREE 64)  30 mL Oral BID  . lidocaine  1 patch Transdermal Q24H  . mouth rinse  15 mL Mouth Rinse q12n4p  . mupirocin ointment   Nasal BID   Continuous Infusions: . dextrose 5 % and 0.45% NaCl 1,000 mL infusion 50 mL/hr at 10/18/15 0751     LOS: 10 days    Time spent: 25 minutes  Greater than 50% of the time spent on counseling and coordinating the care.   Leisa Lenz, MD [facility] Pager [phone]  If 7PM-7AM, please contact night-coverage [email or web address] Password [facility] 10/18/2015, 9:37 AM

## 2015-10-18 NOTE — Progress Notes (Addendum)
Pt son in low asking for letter stating that pt is incapacitated to leave the hospital and this to be notarized so he can take it to the bank and write check in Mrs Monestime's name. I asked him who is the POA and he said his wife is but she is not in town today and he needs it today as he needs to pay her bills. I explained to him I take care of the pt health issues and would not be able to sign and notarize such letter and recommended either SW involvement. I explained to him to ask POA for such authorization but e insisted he needed it today. He went onto saying "you are running away from your responsibility and trying to make me go through a lawyer which may take weeks and I needs this today". I again explained to him I cannot write such a letter and that he should talk to [facility] about what to do.  Leisa Lenz [facility] [phone]

## 2015-10-18 NOTE — Clinical Social Work Note (Signed)
CSW continues to follow for discharge needs. Per palliative's note today, prognosis is <2 weeks and patient is being transitioned to comfort care.  [name], [place]

## 2015-10-18 NOTE — Progress Notes (Signed)
Daily Progress Note   Patient Name: [name]       Date: 10/18/2015 DOB: [birth year]  Age: 82 y.o. MRN#: [id number] Attending Physician: Robbie Lis, MD Primary Care Physician: [name], MD Admit Date: 10/08/2015  Reason for Consultation/Follow-up: Establishing goals of care  Subjective:  patient moans and groans, appears with non verbal gestures of distress and discomfort, wincing, grimacing. Some furrowing of the brow, and some jaw clenching also evident. Still not moving her L side at all.  See discussions below:   Length of Stay: 10  Current Medications: Scheduled Meds:  . chlorhexidine  15 mL Mouth Rinse BID  . enoxaparin (LOVENOX) injection  1 mg/kg Subcutaneous Q12H  . lidocaine  1 patch Transdermal Q24H  . mouth rinse  15 mL Mouth Rinse q12n4p  . mupirocin ointment   Nasal BID  . potassium chloride  10 mEq Intravenous Q1 Hr x 4    Continuous Infusions: . dextrose 5 % and 0.45% NaCl 1,000 mL infusion 50 mL/hr at 10/18/15 0751  . HYDROmorphone      PRN Meds: [DISCONTINUED] acetaminophen **OR** acetaminophen, haloperidol lactate, HYDROmorphone, LORazepam, [DISCONTINUED] methocarbamol **OR** methocarbamol (ROBAXIN)  IV, [DISCONTINUED] metoCLOPramide **OR** metoCLOPramide (REGLAN) injection, ondansetron **OR** ondansetron (ZOFRAN) IV, [DISCONTINUED] menthol-cetylpyridinium **OR** phenol  Physical Exam         Essentially unresponsive S1 S2 Coarse shallow breath sounds Abdomen soft Worsening peripheral edema both UE and LE  Vital Signs: BP (!) 127/48 (BP Location: Left Arm)   Pulse 60   Temp 97.9 F (36.6 C) (Axillary)   Resp 16   Ht 5\' 4"  (1.626 m)   Wt 71.7 kg (158 lb) Comment: bedscale  SpO2 95%   BMI 27.12 kg/m  SpO2: SpO2: 95 % O2 Device: O2  Device: High Flow Nasal Cannula O2 Flow Rate: O2 Flow Rate (L/min): 2 L/min  Intake/output summary:  Intake/Output Summary (Last 24 hours) at 10/18/15 1039 Last data filed at 10/18/15 0733  Gross per 24 hour  Intake              550 ml  Output             1085 ml  Net             -535 ml   LBM: Last BM Date: 10/17/15 (  colostomy putting out) Baseline Weight: Weight: 70.4 kg (155 lb 1.6 oz) Most recent weight: Weight: 71.7 kg (158 lb) (bedscale)       Palliative Assessment/Data:    Flowsheet Rows   Flowsheet Row Most Recent Value  Intake Tab  Referral Department  Hospitalist  Unit at Time of Referral  Intermediate Care Unit  Palliative Care Primary Diagnosis  Cardiac  Date Notified  10/12/15  Palliative Care Type  New Palliative care  Reason for referral  Clarify Goals of Care  Date of Admission  10/08/15  Date first seen by Palliative Care  10/14/15  # of days Palliative referral response time  2 Day(s)  # of days IP prior to Palliative referral  4  Clinical Assessment  Palliative Performance Scale Score  10%  Pain Max last 24 hours  Not able to report  Pain Min Last 24 hours  Not able to report  Psychosocial & Spiritual Assessment  Palliative Care Outcomes  Patient/Family meeting held?  Yes  Who was at the meeting?  Patient, 2 daughters on initial encounter   Palliative Care Outcomes  Clarified goals of care      Patient Active Problem List   Diagnosis Date Noted  . Acute renal failure ([place])   . Acute encephalopathy   . Palliative care encounter   . Anxiety state   . Acute pain of lower extremity   . Difficulty walking   . CHF (congestive heart failure) ([place])   . Acute and chronic respiratory failure with hypoxia ([place]) 10/10/2015  . Hip fracture, left ([place]) 10/08/2015  . Closed left hip fracture ([place]) 10/08/2015  . Anemia 10/08/2015  . Fall 10/08/2015  . Colostomy in place [facility]) 09/25/2015  . SBO (small bowel obstruction) ([place]) 09/24/2015  .  Gastrointestinal hemorrhage with melena   . Subtherapeutic anticoagulation   . Severe malnutrition ([place]) 06/20/2015  . Acute deep vein thrombosis (DVT) of brachial vein of right upper extremity ([place])   . Localized edema   . Hypokalemia   . Hypomagnesemia   . Debilitated 06/14/2015  . History of creation of ostomy 06/14/2015  . Abdominal pain   . Chronic diastolic congestive heart failure ([place])   . History of CVA with residual deficit   . Respiratory depression   . Stool culture positive for Clostridium difficile   . On total parenteral nutrition (TPN)   . Urinary retention   . Leukocytosis   . Tachycardia   . Normocytic anemia   . Acute kidney injury superimposed on chronic kidney disease ([place])   . Status post partial colectomy   . Ileus, postoperative   . Postoperative pain   . Diverticulitis of large intestine with perforation and abscess without bleeding   . CKD (chronic kidney disease) stage 3, GFR 30-59 ml/min 05/26/2015  . HOH (hard of hearing) 05/26/2015  . Protein-calorie malnutrition, moderate ([place]) 05/26/2015  . Diverticulitis of intestine with perforation and abscess 05/25/2015  . Diverticulosis   . Chronic thoracic back pain   . Acute on chronic diastolic (congestive) heart failure ([place])   . Perforation of intestine due to diverticulitis of gastrointestinal tract ([place])   . SOB (shortness of breath) 05/01/2015  . Supratherapeutic INR   . Paroxysmal atrial fibrillation ([facility])   . Essential hypertension, benign   . Left sided abdominal pain   . Diverticulitis 03/08/2015  . Obesity   . Chronic atrial fibrillation ([place])   . Upper airway cough syndrome   . Dyspnea 05/22/2014  . Tachycardia-bradycardia syndrome  with symptomatic bradycardia 01/03/2014  . Goals of care, counseling/discussion 12/28/2013  . History of CVA (cerebrovascular accident) 10/25/2013  . Diabetes ([place]) 08/03/2013  . Myofacial muscle pain 10/16/2011  . Back pain, Left Flank 07/01/2011  .  Cerebral embolism with cerebral infarction ([place]) 06/12/2011  . CVA (cerebral infarction) 05/20/2011  . Weakness of left side of body 05/16/2011  . Elevated digoxin level 05/16/2011  . Pacemaker 05/21/2010  . Edema 04/24/2010  . Pericardial effusion, Large   . Chronic anticoagulation - Coumadin, CHADS2VASC=7   . HTN (hypertension)   . Permanent atrial fibrillation ([place]) 04/14/2010    Palliative Care Assessment & Plan   Patient Profile:    Assessment:  Acute metabolic encephalopathy High suspicion for stroke Acute on chronic diastolic CHF Shallow breathing Generalized restless ness/pain, end of life.   Recommendations/Plan:  Discussed with bedside RN Judeen Hammans, discussed with patient's daughter [name] and son in law.  Family has agreed to continuous Dilaudid low dose infusion for comfort measures.  D/c Po meds Ativan IV PRN Bolus IV Dilaudid PRN  Code Status:    Code Status Orders        Start     Ordered   10/11/15 [id number]  Do not attempt resuscitation (DNR)  Continuous    Question Answer Comment  In the event of cardiac or respiratory ARREST Do not call a "code blue"   In the event of cardiac or respiratory ARREST Do not perform Intubation, CPR, defibrillation or ACLS   In the event of cardiac or respiratory ARREST Use medication by any route, position, wound care, and other measures to relive pain and suffering. May use oxygen, suction and manual treatment of airway obstruction as needed for comfort.      10/11/15 1653    Code Status History    Date Active Date Inactive Code Status Order ID Comments User Context   10/08/2015  5:09 AM 10/11/2015  4:53 PM Full Code [id number]  Norval Morton, MD ED   09/24/2015 11:05 PM 09/30/2015  2:41 PM Full Code [id number]  Edwin Dada, MD Inpatient   06/14/2015  5:00 PM 06/26/2015  1:42 PM Full Code [id number]  Cathlyn Parsons, PA-C Inpatient   05/25/2015  9:27 PM 06/14/2015  5:00 PM Full Code [id number]  Olam Idler, MD Inpatient     05/01/2015  8:05 PM 05/08/2015  7:13 PM Full Code [id number]  Mercy Riding, MD Inpatient   04/03/2015  7:31 PM 04/11/2015  4:53 PM Full Code [id number]  Rogue Bussing, MD ED   03/08/2015  8:51 PM 03/09/2015  8:06 PM Full Code [id number]  Frazier Richards, MD Inpatient   01/16/2015 11:49 AM 01/26/2015  6:19 PM Full Code [id number]  Nani Skillern, PA-C Inpatient   01/15/2015 11:13 AM 01/16/2015 11:49 AM Full Code [id number]  Lonn Georgia, PA-C Inpatient   01/02/2014  4:24 PM 01/03/2014  1:35 PM Full Code [id number]  Sanda Klein, MD Inpatient   06/21/2011  8:59 AM 06/24/2011  4:38 PM Partial Code [id number]  Carolin Guernsey, MD Inpatient   06/20/2011  4:45 PM 06/21/2011  8:59 AM Partial Code [id number]  Angelica Ran, MD Inpatient   05/16/2011  5:59 PM 05/20/2011  5:57 PM Partial Code [id number]  Carlynn Spry, RN Inpatient    Advance Directive Documentation   Flowsheet Row Most Recent Value  Type of Advance Directive  Healthcare Power of Attorney, Living will  Pre-existing out of facility DNR order (yellow form  or pink MOST form)  No data  "MOST" Form in Place?  No data       Prognosis:   < 2 weeks  Discharge Planning:  Anticipated Hospital Death is highly likely.   Care plan was discussed with  Daughter [name] who is one of the HCPOA agents and (another daughter co HCPOA agent Helene Kelp arriving into the hospital from out of town) and son in law present at the bedside   Thank you for allowing the Palliative Medicine Team to assist in the care of this patient.   Time In:  10 Time Out: 1035 Total Time 35 Prolonged Time Billed  no       Greater than 50%  of this time was spent counseling and coordinating care related to the above assessment and plan.  Loistine Chance MD [place] palliative medicine team [phone]   Please contact Palliative Medicine Team phone at [phone] for questions and concerns.

## 2015-10-18 NOTE — Progress Notes (Signed)
Nutrition Brief Note  Chart reviewed due to low Braden score. Pt now transitioning to comfort care.  No nutrition interventions warranted at this time.  Please consult as needed.   Falisha Osment, RD, LDN, CNSC Pager [phone] After Hours Pager [phone]    

## 2015-10-18 NOTE — Progress Notes (Signed)
Occupational Therapy Discharge Patient Details Name: [name] MRN: [id number] DOB: 1923/10/22 Today's Date: 10/18/2015 Time:  -     Patient discharged from OT services secondary Pt now transitioning to full comfort care.   Please see latest therapy progress note for current level of functioning and progress toward goals.    Progress and discharge plan discussed with patient and/or caregiver:  Per palliative care note, pt is now full comfort care.  OT no longer appropriate.   [place], [place], OTR/L [id number]  10/18/2015, 8:25 PM

## 2015-10-18 NOTE — Progress Notes (Signed)
Palliative Care MD and PA paged asking them to call on patient.  Family is in agreement to have a continuous drip for pain control, which would greatly help patient.

## 2015-10-19 MED ORDER — POTASSIUM CHLORIDE 10 MEQ/100ML IV SOLN: 10.0000 meq | INTRAVENOUS | Status: DC

## 2015-10-19 NOTE — Progress Notes (Addendum)
Patient ID: [name], female   DOB: 06-30-1923, 82 y.o.   MRN: [id number]  PROGRESS NOTE    MARGARY ATTALLA  [id number] DOB: [birth year] DOA: 10/08/2015  PCP: [name], MD   Brief Narrative:  82 year old female with past medical history significant for chronic atrial fibrillation, was on Coumadin but currently on Lovenox subcutaneous, chronic diastolic congestive heart failure with 2-D echo in January 2017 with ejection fraction of 55%, hypertension, pericardial effusion status post pericardial window 01/16/2015, history of CVA, tachycardia bradycardia syndrome status post pacemaker, recent hospitalization 09/24/15-09/30/15 for urgent sigmoid colectomy with Hartmann's procedure and small bowel resection on 05/31/15 following which she was transferred to inpatient rehabilitation and then transitioned home, awaiting colostomy reversal/repair of parastomal hernia.  Patient presents to [facility] 10/08/2015 following a mechanical fall at home and sustaining left hip pain. She was found to have left intertrochanteric hip fracture. She underwent ORIF by Dr. Erlinda Hong [id number] without complications. Unfortunately her hospital course was complicated with worsening respiratory status requiring BiPAP. Her mental status has slowly been declining. Palliative care was consulted for goals of care.  Assessment & Plan:   Acute metabolic encephalopathy - No significant changes in mental status. The thought is that she probably has had ischemic stroke but we can't do MRI because of pacemaker - Palliative care consulted for goals of care  - Family wishes to proceed with comfort care - Continue supportive measures   Acute respiratory failure with hypoxia /Acute on chronic diastolic CHF - Thought to be secondary to volume overload - Patient was not responsive to IV diuretics and eventually there were stopped because of renal failure. Diuresis then reinitiated with renal recovery - Not on any diuretics at this  time   Permanent atrial fibrillation / tachybradycardia syndrome s/p pacemaker - CHADS2Vasc 7. - On Lovenox subQ  - Rate controlled without CCB or BB  Acute on chronic stage III chronic kidney disease - Cr WNL  Stage II sacral ulcer - Per RN care   Hypokalemia, hypomagnesemia - Supplemented - no further blood work to ensure comfort   Left intertrochanteric hip fracture - Ss/p ORIF 9/13 by Dr. Erlinda Hong.   Essential hypertension - Stable   Anemia of chronic disease - Due to CKD - Hgb stable at 9.5    DVT prophylaxis: SCDs bilaterally; Lovenox subcutaneous therapeutic dose Code Status: DNR/DNI Family Communication: son in law at the bedside this am  Disposition Plan: Comfort care, unable to transport anywhere at this point due to instability   Consultants:   Orthopedics, Dr. Erlinda Hong  Cardiology, Dr. Sallyanne Kuster  Palliative care team  Procedures:   Foley catheter 10/14/2015  Antimicrobials:   None   Subjective: No overnight events.  Objective: Vitals:   10/18/15 2152 10/18/15 2354 10/19/15 0431 10/19/15 0642  BP:  (!) 103/57 (!) 85/45 (!) 95/45  Pulse: 66 60 60 63  Resp: 16 11 10 12   Temp:  97.8 F (36.6 C) 97.6 F (36.4 C) 97.1 F (36.2 C)  TempSrc:  Axillary Axillary Axillary  SpO2: 92% 94% 98% 99%  Weight:      Height:        Intake/Output Summary (Last 24 hours) at 10/19/15 0916 Last data filed at 10/19/15 0724  Gross per 24 hour  Intake          1281.25 ml  Output              650 ml  Net  631.25 ml   Filed Weights   10/08/15 0600 10/09/15 0629  Weight: 70.4 kg (155 lb 1.6 oz) 71.7 kg (158 lb)    Examination:  General exam: Calm, no distress  Respiratory system: Diminished breath sounds, no wheezing  Cardiovascular system: S1 & S2 heard, RRR Gastrointestinal system: (+) BS, non tender  Central nervous system: Not responding to verbal or painful stimuli  Extremities: RUE more swollen than left; palpable pulses  Skin: warm, dry   Psychiatry: Unable to assess mental status due to altered mental status  Data Reviewed: I have personally reviewed following labs and imaging studies  CBC:  Recent Labs Lab 10/14/15 0205 10/15/15 0529 10/16/15 0405 10/17/15 0407 10/18/15 0505  WBC 5.8 5.7 5.8 6.1 7.1  HGB 8.9* 9.0* 8.3* 9.1* 9.5*  HCT 30.6* 30.3* 28.5* 31.2* 33.0*  MCV 74.8* 73.9* 74.6* 75.2* 75.9*  PLT 331 349 316 316 [id number]   Basic Metabolic Panel:  Recent Labs Lab 10/14/15 0205 10/15/15 0529 10/16/15 0405 10/17/15 0407 10/18/15 0505  NA 135 137 138 141 142  K 4.6 4.4 3.8 3.5 3.2*  CL 99* 98* 101 102 101  CO2 28 29 28 30 31   GLUCOSE 83 92 107* 110* 114*  BUN 61* 65* 53* 41* 31*  CREATININE 2.07* 1.63* 1.28* 1.01* 0.98  CALCIUM 8.5* 8.9 8.9 9.2 9.1  MG  --   --   --   --  2.6*   GFR: Estimated Creatinine Clearance: 37 mL/min (by C-G formula based on SCr of 0.98 mg/dL). Liver Function Tests: No results for input(s): AST, ALT, ALKPHOS, BILITOT, PROT, ALBUMIN in the last 168 hours. No results for input(s): LIPASE, AMYLASE in the last 168 hours. No results for input(s): AMMONIA in the last 168 hours. Coagulation Profile: No results for input(s): INR, PROTIME in the last 168 hours. Cardiac Enzymes: No results for input(s): CKTOTAL, CKMB, CKMBINDEX, TROPONINI in the last 168 hours. BNP (last 3 results) No results for input(s): PROBNP in the last 8760 hours. HbA1C: No results for input(s): HGBA1C in the last 72 hours. CBG: No results for input(s): GLUCAP in the last 168 hours. Lipid Profile: No results for input(s): CHOL, HDL, LDLCALC, TRIG, CHOLHDL, LDLDIRECT in the last 72 hours. Thyroid Function Tests: No results for input(s): TSH, T4TOTAL, FREET4, T3FREE, THYROIDAB in the last 72 hours. Anemia Panel: No results for input(s): VITAMINB12, FOLATE, FERRITIN, TIBC, IRON, RETICCTPCT in the last 72 hours. Urine analysis:    Component Value Date/Time   COLORURINE RED (A) 10/09/2015 1141    APPEARANCEUR CLOUDY (A) 10/09/2015 1141   LABSPEC 1.013 10/09/2015 1141   PHURINE 5.5 10/09/2015 1141   GLUCOSEU NEGATIVE 10/09/2015 1141   HGBUR LARGE (A) 10/09/2015 1141   BILIRUBINUR NEGATIVE 10/09/2015 1141   BILIRUBINUR negative 02/21/2015 1203   BILIRUBINUR Negative 07/17/2014 1207   KETONESUR NEGATIVE 10/09/2015 1141   PROTEINUR 100 (A) 10/09/2015 1141   UROBILINOGEN 0.2 02/21/2015 1203   UROBILINOGEN 0.2 07/02/2011 0443   NITRITE NEGATIVE 10/09/2015 1141   LEUKOCYTESUR SMALL (A) 10/09/2015 1141   Sepsis Labs: @LABRCNTIP (procalcitonin:4,lacticidven:4)    Recent Results (from the past 240 hour(s))  MRSA PCR Screening     Status: Abnormal   Collection Time: 10/08/15  3:59 PM  Result Value Ref Range Status   MRSA by PCR POSITIVE (A) NEGATIVE Final      Radiology Studies: No results found.    Scheduled Meds: . chlorhexidine  15 mL Mouth Rinse BID  . lidocaine  1 patch Transdermal Q24H  .  mouth rinse  15 mL Mouth Rinse q12n4p  . mupirocin ointment   Nasal BID   Continuous Infusions: . dextrose 5 % and 0.45% NaCl 1,000 mL infusion 50 mL/hr at 10/19/15 0724  . HYDROmorphone 0.5 mg/hr (10/18/15 1119)     LOS: 11 days    Time spent: 15 minutes  Greater than 50% of the time spent on counseling and coordinating the care.   Leisa Lenz, MD [facility] Pager [phone]  If 7PM-7AM, please contact night-coverage [email or web address] Password [facility] 10/19/2015, 9:16 AM

## 2015-10-19 NOTE — Progress Notes (Signed)
Daily Progress Note   Patient Name: [name]       Date: 10/19/2015 DOB: 06-Aug-1923  Age: 82 y.o. MRN#: [id number] Attending Physician: Robbie Lis, MD Primary Care Physician: [name], MD Admit Date: 10/08/2015  Reason for Consultation/Follow-up: Establishing goals of care  Subjective:  patient essentially unresponsive now. Does not appear to have non verbal gestures of distress or discomfort     Family meeting with HCPOA daughter Helene Kelp, daughter [name] and son in law and several other people in the room this morning.   See discussions below:   Length of Stay: 11  Current Medications: Scheduled Meds:  . chlorhexidine  15 mL Mouth Rinse BID  . lidocaine  1 patch Transdermal Q24H  . mouth rinse  15 mL Mouth Rinse q12n4p  . mupirocin ointment   Nasal BID    Continuous Infusions: . dextrose 5 % and 0.45% NaCl 1,000 mL infusion 50 mL/hr at 10/19/15 1000  . HYDROmorphone 0.5 mg/hr (10/18/15 1119)    PRN Meds: [DISCONTINUED] acetaminophen **OR** acetaminophen, haloperidol lactate, HYDROmorphone, LORazepam, [DISCONTINUED] methocarbamol **OR** methocarbamol (ROBAXIN)  IV, [DISCONTINUED] metoCLOPramide **OR** metoCLOPramide (REGLAN) injection, ondansetron **OR** ondansetron (ZOFRAN) IV, [DISCONTINUED] menthol-cetylpyridinium **OR** phenol  Physical Exam         Essentially unresponsive S1 S2 paced rhythm on monitor Coarse shallow breath sounds Abdomen soft Worsening peripheral edema both UE and LE  Vital Signs: BP (!) 95/45 (BP Location: Left Arm)   Pulse 63   Temp 97.1 F (36.2 C) (Axillary)   Resp 12   Ht 5\' 4"  (1.626 m)   Wt 71.7 kg (158 lb) Comment: bedscale  SpO2 99%   BMI 27.12 kg/m  SpO2: SpO2: 99 % O2 Device: O2 Device: Nasal Cannula O2 Flow Rate: O2  Flow Rate (L/min): 3 L/min  Intake/output summary:   Intake/Output Summary (Last 24 hours) at 10/19/15 1113 Last data filed at 10/19/15 1000  Gross per 24 hour  Intake          1411.25 ml  Output              775 ml  Net           636.25 ml   LBM: Last BM Date: 10/19/15 Baseline Weight: Weight: 70.4 kg (155 lb 1.6 oz) Most recent weight: Weight: 71.7  kg (158 lb) (bedscale)       Palliative Assessment/Data:    Flowsheet Rows   Flowsheet Row Most Recent Value  Intake Tab  Referral Department  Hospitalist  Unit at Time of Referral  Intermediate Care Unit  Palliative Care Primary Diagnosis  Cardiac  Date Notified  10/12/15  Palliative Care Type  New Palliative care  Reason for referral  Clarify Goals of Care  Date of Admission  10/08/15  Date first seen by Palliative Care  10/14/15  # of days Palliative referral response time  2 Day(s)  # of days IP prior to Palliative referral  4  Clinical Assessment  Palliative Performance Scale Score  10%  Pain Max last 24 hours  Not able to report  Pain Min Last 24 hours  Not able to report  Psychosocial & Spiritual Assessment  Palliative Care Outcomes  Patient/Family meeting held?  Yes  Who was at the meeting?  Patient, 2 daughters on initial encounter   Palliative Care Outcomes  Clarified goals of care      Patient Active Problem List   Diagnosis Date Noted  . Acute renal failure ([place])   . Acute encephalopathy   . Palliative care encounter   . Anxiety state   . Acute pain of lower extremity   . Difficulty walking   . CHF (congestive heart failure) ([place])   . Acute and chronic respiratory failure with hypoxia ([place]) 10/10/2015  . Hip fracture, left ([place]) 10/08/2015  . Closed left hip fracture ([place]) 10/08/2015  . Anemia 10/08/2015  . Fall 10/08/2015  . Colostomy in place [facility]) 09/25/2015  . SBO (small bowel obstruction) ([place]) 09/24/2015  . Gastrointestinal hemorrhage with melena   . Subtherapeutic anticoagulation   .  Severe malnutrition ([place]) 06/20/2015  . Acute deep vein thrombosis (DVT) of brachial vein of right upper extremity ([place])   . Localized edema   . Hypokalemia   . Hypomagnesemia   . Debilitated 06/14/2015  . History of creation of ostomy 06/14/2015  . Abdominal pain   . Chronic diastolic congestive heart failure ([place])   . History of CVA with residual deficit   . Respiratory depression   . Stool culture positive for Clostridium difficile   . On total parenteral nutrition (TPN)   . Urinary retention   . Leukocytosis   . Tachycardia   . Normocytic anemia   . Acute kidney injury superimposed on chronic kidney disease ([place])   . Status post partial colectomy   . Ileus, postoperative   . Postoperative pain   . Diverticulitis of large intestine with perforation and abscess without bleeding   . CKD (chronic kidney disease) stage 3, GFR 30-59 ml/min 05/26/2015  . HOH (hard of hearing) 05/26/2015  . Protein-calorie malnutrition, moderate ([place]) 05/26/2015  . Diverticulitis of intestine with perforation and abscess 05/25/2015  . Diverticulosis   . Chronic thoracic back pain   . Acute on chronic diastolic (congestive) heart failure ([place])   . Perforation of intestine due to diverticulitis of gastrointestinal tract ([place])   . SOB (shortness of breath) 05/01/2015  . Supratherapeutic INR   . Paroxysmal atrial fibrillation ([facility])   . Essential hypertension, benign   . Left sided abdominal pain   . Diverticulitis 03/08/2015  . Obesity   . Chronic atrial fibrillation ([place])   . Upper airway cough syndrome   . Dyspnea 05/22/2014  . Tachycardia-bradycardia syndrome with symptomatic bradycardia 01/03/2014  . Goals of care, counseling/discussion 12/28/2013  . History of CVA (cerebrovascular  accident) 10/25/2013  . Diabetes ([place]) 08/03/2013  . Myofacial muscle pain 10/16/2011  . Back pain, Left Flank 07/01/2011  . Cerebral embolism with cerebral infarction ([place]) 06/12/2011  . CVA (cerebral  infarction) 05/20/2011  . Weakness of left side of body 05/16/2011  . Elevated digoxin level 05/16/2011  . Pacemaker 05/21/2010  . Edema 04/24/2010  . Pericardial effusion, Large   . Chronic anticoagulation - Coumadin, CHADS2VASC=7   . HTN (hypertension)   . Permanent atrial fibrillation ([place]) 04/14/2010    Palliative Care Assessment & Plan   Patient Profile:    Assessment:  Acute metabolic encephalopathy High suspicion for stroke Acute on chronic diastolic CHF Shallow breathing Generalized restless ness/pain, end of life.   Recommendations/Plan: Family meeting: Reviewed hospital course extensively with family at the bedside All questions answered to the best of my ability Continue Dilaudid drip for now  Code Status:    Code Status Orders        Start     Ordered   10/11/15 1654  Do not attempt resuscitation (DNR)  Continuous    Question Answer Comment  In the event of cardiac or respiratory ARREST Do not call a "code blue"   In the event of cardiac or respiratory ARREST Do not perform Intubation, CPR, defibrillation or ACLS   In the event of cardiac or respiratory ARREST Use medication by any route, position, wound care, and other measures to relive pain and suffering. May use oxygen, suction and manual treatment of airway obstruction as needed for comfort.      10/11/15 1653    Code Status History    Date Active Date Inactive Code Status Order ID Comments User Context   10/08/2015  5:09 AM 10/11/2015  4:53 PM Full Code [id number]  Norval Morton, MD ED   09/24/2015 11:05 PM 09/30/2015  2:41 PM Full Code [id number]  Edwin Dada, MD Inpatient   06/14/2015  5:00 PM 06/26/2015  1:42 PM Full Code [id number]  Lavon Paganini [place], PA-C Inpatient   05/25/2015  9:27 PM 06/14/2015  5:00 PM Full Code [id number]  Olam Idler, MD Inpatient   05/01/2015  8:05 PM 05/08/2015  7:13 PM Full Code [id number]  Mercy Riding, MD Inpatient   04/03/2015  7:31 PM 04/11/2015  4:53 PM Full Code  [id number]  Rogue Bussing, MD ED   03/08/2015  8:51 PM 03/09/2015  8:06 PM Full Code [id number]  Frazier Richards, MD Inpatient   01/16/2015 11:49 AM 01/26/2015  6:19 PM Full Code [id number]  Nani Skillern, PA-C Inpatient   01/15/2015 11:13 AM 01/16/2015 11:49 AM Full Code [id number]  Lonn Georgia, PA-C Inpatient   01/02/2014  4:24 PM 01/03/2014  1:35 PM Full Code [id number]  Sanda Klein, MD Inpatient   06/21/2011  8:59 AM 06/24/2011  4:38 PM Partial Code [id number]  Carolin Guernsey, MD Inpatient   06/20/2011  4:45 PM 06/21/2011  8:59 AM Partial Code [id number]  Angelica Ran, MD Inpatient   05/16/2011  5:59 PM 05/20/2011  5:57 PM Partial Code [id number]  Carlynn Spry, RN Inpatient    Advance Directive Documentation   Flowsheet Row Most Recent Value  Type of Advance Directive  Healthcare Power of Attorney, Living will  Pre-existing out of facility DNR order (yellow form or pink MOST form)  No data  "MOST" Form in Place?  No data       Prognosis:   < 2 weeks  Discharge Planning:  Anticipated  Hospital Death is highly likely.   Care plan was discussed with  Daughters [name] and Helene Kelp, son in law and bedside RN   Thank you for allowing the Palliative Medicine Team to assist in the care of this patient.   Time In:  10 Time Out: 1035 Total Time 35 Prolonged Time Billed  no       Greater than 50%  of this time was spent counseling and coordinating care related to the above assessment and plan.  Loistine Chance MD [place] palliative medicine team [phone]   Please contact Palliative Medicine Team phone at [phone] for questions and concerns.

## 2015-10-19 NOTE — Progress Notes (Signed)
Dr Rowe Pavy at bedside with family and pt. Family debating having monitor in room turned off so they will not spend time staring at it. They stated they would let me know if they wanted it turned off. Also re-educated about gowning and gloving and using hand gel. Consuelo Pandy RN

## 2015-10-20 DIAGNOSIS — W19XXXS Unspecified fall, sequela: Secondary | ICD-10-CM

## 2015-10-20 NOTE — Progress Notes (Addendum)
Patient ID: [name], female   DOB: 22-Oct-1923, 82 y.o.   MRN: [id number]  PROGRESS NOTE    [name]  [id number] DOB: 11-03-1923 DOA: 10/08/2015  PCP: [name], MD   Brief Narrative:  82 year old female with past medical history significant for chronic atrial fibrillation, was on Coumadin but currently on Lovenox subcutaneous, chronic diastolic congestive heart failure with 2-D echo in January 2017 with ejection fraction of 55%, hypertension, pericardial effusion status post pericardial window 01/16/2015, history of CVA, tachycardia bradycardia syndrome status post pacemaker, recent hospitalization 09/24/15-09/30/15 for urgent sigmoid colectomy with Hartmann's procedure and small bowel resection on 05/31/15 following which she was transferred to inpatient rehabilitation and then transitioned home, awaiting colostomy reversal/repair of parastomal hernia.  Patient presents to [facility] 10/08/2015 following a mechanical fall at home and sustaining left hip pain. She was found to have left intertrochanteric hip fracture. She underwent ORIF by Dr. Erlinda Hong [id number] without complications. Unfortunately her hospital course was complicated with worsening respiratory status requiring BiPAP. Her mental status has slowly been declining. Palliative care was consulted for goals of care.  Assessment & Plan:   Acute metabolic encephalopathy - No significant changes in mental status. The thought is that she probably has had ischemic stroke but we can't do MRI because of pacemaker - continue comfort care - Continue supportive measures   Acute respiratory failure with hypoxia /Acute on chronic diastolic CHF - Thought to be secondary to volume overload - Patient was not responsive to IV diuretics and eventually there were stopped because of renal failure. Diuresis then reinitiated with renal recovery - Not on any diuretics at this time   Permanent atrial fibrillation / tachybradycardia syndrome s/p  pacemaker - CHADS2Vasc 7. - On Lovenox subQ  - Rate controlled without CCB or BB  Acute on chronic stage III chronic kidney disease - Cr WNL  Stage II sacral ulcer - Per RN care   Hypokalemia, hypomagnesemia - Supplemented - No further blood work to ensure comfort   Left intertrochanteric hip fracture - Ss/p ORIF 9/13 by Dr. Erlinda Hong.   Essential hypertension - Stable   Anemia of chronic disease - Due to CKD - Hgb stable at 9.5    DVT prophylaxis: SCDs bilaterally; Lovenox subcutaneous therapeutic dose Code Status: DNR/DNI Family Communication: family not at the bedside this am Disposition Plan: Comfort care, unable to transport anywhere at this point due to instability   Consultants:   Orthopedics, Dr. Erlinda Hong  Cardiology, Dr. Sallyanne Kuster  Palliative care team  Procedures:   Foley catheter 10/14/2015  Antimicrobials:   None   Subjective: No overnight events.  Objective: Vitals:   10/19/15 1925 10/20/15 0001 10/20/15 0440 10/20/15 0714  BP: (!) 111/54 (!) 112/91 (!) 110/51 (!) 106/54  Pulse: 63 67 60 64  Resp: 14 20 13 16   Temp: 98.4 F (36.9 C) 97.6 F (36.4 C) 97.5 F (36.4 C) 97.5 F (36.4 C)  TempSrc: Axillary Axillary Axillary Axillary  SpO2: 96%  92% (!) 89%  Weight:      Height:        Intake/Output Summary (Last 24 hours) at 10/20/15 1130 Last data filed at 10/20/15 1000  Gross per 24 hour  Intake           925.48 ml  Output              450 ml  Net           475.48 ml   [employer]  10/08/15 0600 10/09/15 0629  Weight: 70.4 kg (155 lb 1.6 oz) 71.7 kg (158 lb)    Examination:  General exam: No distress  Respiratory system: Diminished breath sounds, no wheezing  Cardiovascular system: S1 & S2 heard, rate controlled  Gastrointestinal system: (+) BS, non tender  Central nervous system: Not responding to verbal or painful stimuli  Extremities: RUE more swollen than left; palpable pulses  Skin: warm, dry  Psychiatry: Unable to  assess mental status due to altered mental status  Data Reviewed: I have personally reviewed following labs and imaging studies  CBC:  Recent Labs Lab 10/14/15 0205 10/15/15 0529 10/16/15 0405 10/17/15 0407 10/18/15 0505  WBC 5.8 5.7 5.8 6.1 7.1  HGB 8.9* 9.0* 8.3* 9.1* 9.5*  HCT 30.6* 30.3* 28.5* 31.2* 33.0*  MCV 74.8* 73.9* 74.6* 75.2* 75.9*  PLT 331 349 316 316 [id number]   Basic Metabolic Panel:  Recent Labs Lab 10/14/15 0205 10/15/15 0529 10/16/15 0405 10/17/15 0407 10/18/15 0505  NA 135 137 138 141 142  K 4.6 4.4 3.8 3.5 3.2*  CL 99* 98* 101 102 101  CO2 28 29 28 30 31   GLUCOSE 83 92 107* 110* 114*  BUN 61* 65* 53* 41* 31*  CREATININE 2.07* 1.63* 1.28* 1.01* 0.98  CALCIUM 8.5* 8.9 8.9 9.2 9.1  MG  --   --   --   --  2.6*   GFR: Estimated Creatinine Clearance: 37 mL/min (by C-G formula based on SCr of 0.98 mg/dL). Liver Function Tests: No results for input(s): AST, ALT, ALKPHOS, BILITOT, PROT, ALBUMIN in the last 168 hours. No results for input(s): LIPASE, AMYLASE in the last 168 hours. No results for input(s): AMMONIA in the last 168 hours. Coagulation Profile: No results for input(s): INR, PROTIME in the last 168 hours. Cardiac Enzymes: No results for input(s): CKTOTAL, CKMB, CKMBINDEX, TROPONINI in the last 168 hours. BNP (last 3 results) No results for input(s): PROBNP in the last 8760 hours. HbA1C: No results for input(s): HGBA1C in the last 72 hours. CBG: No results for input(s): GLUCAP in the last 168 hours. Lipid Profile: No results for input(s): CHOL, HDL, LDLCALC, TRIG, CHOLHDL, LDLDIRECT in the last 72 hours. Thyroid Function Tests: No results for input(s): TSH, T4TOTAL, FREET4, T3FREE, THYROIDAB in the last 72 hours. Anemia Panel: No results for input(s): VITAMINB12, FOLATE, FERRITIN, TIBC, IRON, RETICCTPCT in the last 72 hours. Urine analysis:    Component Value Date/Time   COLORURINE RED (A) 10/09/2015 1141   APPEARANCEUR CLOUDY (A)  10/09/2015 1141   LABSPEC 1.013 10/09/2015 1141   PHURINE 5.5 10/09/2015 1141   GLUCOSEU NEGATIVE 10/09/2015 1141   HGBUR LARGE (A) 10/09/2015 1141   BILIRUBINUR NEGATIVE 10/09/2015 1141   BILIRUBINUR negative 02/21/2015 1203   BILIRUBINUR Negative 07/17/2014 1207   KETONESUR NEGATIVE 10/09/2015 1141   PROTEINUR 100 (A) 10/09/2015 1141   UROBILINOGEN 0.2 02/21/2015 1203   UROBILINOGEN 0.2 07/02/2011 0443   NITRITE NEGATIVE 10/09/2015 1141   LEUKOCYTESUR SMALL (A) 10/09/2015 1141   Sepsis Labs: @LABRCNTIP (procalcitonin:4,lacticidven:4)    Recent Results (from the past 240 hour(s))  MRSA PCR Screening     Status: Abnormal   Collection Time: 10/08/15  3:59 PM  Result Value Ref Range Status   MRSA by PCR POSITIVE (A) NEGATIVE Final      Radiology Studies: No results found.    Scheduled Meds: . chlorhexidine  15 mL Mouth Rinse BID  . lidocaine  1 patch Transdermal Q24H  . mouth rinse  15 mL  Mouth Rinse q12n4p  . mupirocin ointment   Nasal BID   Continuous Infusions: . dextrose 5 % and 0.45% NaCl 1,000 mL infusion 50 mL/hr at 10/20/15 1000  . HYDROmorphone 0.5 mg/hr (10/20/15 0714)     LOS: 12 days    Time spent: 15 minutes  Greater than 50% of the time spent on counseling and coordinating the care.   Leisa Lenz, MD [facility] Pager [phone]  If 7PM-7AM, please contact night-coverage [email or web address] Password [email or web address] 10/20/2015, 11:30 AM

## 2015-10-21 DIAGNOSIS — S72002S Fracture of unspecified part of neck of left femur, sequela: Secondary | ICD-10-CM

## 2015-10-21 MED ORDER — LORAZEPAM 2 MG/ML IJ SOLN: 1.0000 mg | INTRAMUSCULAR | 0 refills | Status: AC | PRN

## 2015-10-21 MED ORDER — HYDROMORPHONE BOLUS VIA INFUSION: 1.0000 mg | INTRAVENOUS | 0 refills | Status: AC | PRN

## 2015-10-21 NOTE — Clinical Social Work Note (Signed)
CSW facilitated patient discharge including contacting patient family and facility to confirm patient discharge plans. Clinical information faxed to facility and family agreeable with plan. CSW arranged ambulance transport via [facility] to [facility] around 4:00 pm. RN to call report prior to discharge [phone]).  CSW will sign off for now as social work intervention is no longer needed. Please consult [place] again if new needs arise.  Dayton Scrape, [place]

## 2015-10-21 NOTE — Progress Notes (Signed)
Witnessed waste on IV dilaudid drip with primary RN, Lavada Mesi, 35 ml wasted in sink.

## 2015-10-21 NOTE — Discharge Summary (Signed)
Physician Discharge Summary  [name] [id number] DOB: [birth year] DOA: 10/08/2015  PCP: [name], MD  Admit date: 10/08/2015 Discharge date: 10/21/2015  Time spent: 45 minutes  Recommendations for Outpatient Follow-up:  1. [place] for End of life care   Discharge Diagnoses:   Acute on chronic resp failure   Closed left hip fracture (HCC)   Metabolic encephalopathy   Pacemaker   Paroxysmal atrial fibrillation (HCC)   Hypokalemia   Anemia   Fall   Acute and chronic respiratory failure with hypoxia (HCC)   CHF (congestive heart failure) (HCC)   Acute pain of lower extremity   Difficulty walking   Palliative care encounter   Anxiety state   Acute renal failure ([place])   Acute encephalopathy   Discharge Condition: poor  Diet recommendation: comfort feeds  Filed Weights   10/08/15 0600 10/09/15 0629  Weight: 70.4 kg (155 lb 1.6 oz) 71.7 kg (158 lb)    History of present illness:  [name] is a 82 y.o. female with medical history significant of anemia, A. fib on chronic anticoagulation of Coumadin, chronic diastolic CHF (last echo in 01/2015 with EF of 55-60%), chronic pericardial effusion, recurrent diverticulitis s/p colostomy; who presents after having a fall with complaints of left hip pain. She reports that she was weighing herself and accidentally stepped off the scale losing her balance and falling backwards onto her left hip,  imaging studies revealed an intratrochanteric left hip fracture and several lab abnormalities  Hospital Course:  82 year old female with past medical history significant for chronic atrial fibrillation, was on Coumadin but currently on Lovenox subcutaneous, chronic diastolic congestive heart failure with 2-D echo in January 2017 with ejection fraction of 55%, hypertension, pericardial effusion status post pericardial window 01/16/2015, history of CVA, tachycardia bradycardia syndrome status post pacemaker, recent  hospitalization 09/24/15-09/30/15 for urgent sigmoid colectomy with Hartmann's procedure and small bowel resection on 05/31/15 following which she was transferred to inpatient rehabilitation and then transitioned home, awaiting colostomy reversal/repair of parastomal hernia. Patient [facility] to [facility] 10/08/2015 following a mechanical fall at home and sustaining left hip pain. She was found to have left intertrochanteric hip fracture. She underwent ORIF by Dr. Erlinda Hong 9/13.  Unfortunately her hospital course was complicated by worsening respiratory status, Acute respiratory failure requiring BiPAP, renal failure and worsening mentation and encephalopathy. -Subsequently Palliative care was consulted for goals of care and after discussion with family she was made DNR and full comfort care abd started on a Dilaudid drip and Ativan for comfort. Being discharged to [facility] for end of life care  Procedures:9/13 PROCEDURE: Treatment of intertrochanteric fracture with intramedullary implant. CPT [phone]   Consultations:  Palliative medicine  Ortho dr.Xu  Discharge Exam: Vitals:   10/21/15 0342 10/21/15 0801  BP: (!) 98/51 (!) 88/44  Pulse: (!) 59 60  Resp: 13 12  Temp: 97 F (36.1 C) 97.1 F (36.2 C)    General: obtunded Discharge Instructions   Discharge Instructions    Discharge instructions    Complete by:  As directed    Full Comfort Measures   Weight bearing as tolerated    Complete by:  As directed      Current Discharge Medication List    START taking these medications   Details  HYDROmorphone (DILAUDID) 2 mg/mL SOLN Inject 1-2 mg into the vein every 2 (two) hours as needed (dyspnea). Qty: 10 mg, Refills: 0    LORazepam (ATIVAN) 2 MG/ML injection Inject 0.5 mLs (  1 mg total) into the vein every 2 (two) hours as needed for anxiety (agitation). Qty: 1 mL, Refills: 0      CONTINUE these medications which have NOT CHANGED   Details  acetaminophen (TYLENOL) 500 MG tablet  Take 1 tablet (500 mg total) by mouth every 8 (eight) hours as needed for moderate pain. Qty: 30 tablet, Refills: 0    lidocaine (LIDODERM) 5 % Place 1 patch onto the skin daily. Remove & Discard patch within 12 hours or as directed by MD Qty: 10 patch, Refills: 0      STOP taking these medications     ALPRAZolam (XANAX) 0.25 MG tablet      Amino Acids-Protein Hydrolys (FEEDING SUPPLEMENT, PRO-STAT SUGAR FREE 64,) LIQD      Cholecalciferol (VITAMIN D3) 5000 units CAPS      cyclobenzaprine (FLEXERIL) 5 MG tablet      DIGITEK 125 MCG tablet      magnesium gluconate (MAGONATE) 500 MG tablet      metolazone (ZAROXOLYN) 2.5 MG tablet      Multiple Vitamin (MULTIVITAMIN WITH MINERALS) TABS tablet      oxyCODONE (OXY IR/ROXICODONE) 5 MG immediate release tablet      potassium chloride (K-DUR) 10 MEQ tablet      saccharomyces boulardii (FLORASTOR) 250 MG capsule      torsemide (DEMADEX) 20 MG tablet      verapamil (CALAN-SR) 120 MG CR tablet      warfarin (COUMADIN) 5 MG tablet      amoxicillin-clavulanate (AUGMENTIN) 875-125 MG tablet        Allergies  Allergen Reactions  . Anti-Inflammatory Enzyme [Nutritional Supplements] Other (See Comments)    Retains fluids and headaches  . Iodinated Diagnostic Agents Hives    HIVES 15MIN S/P IV CONTRAST INJECTION,WILL NEED 13 HR PREP FOR FUTURE INJECTIONS, ok s/p 50mg  po benadryl//a.calhoun  . Spironolactone Other (See Comments)    Hair loss  . Arthrotec [Diclofenac-Misoprostol] Other (See Comments)    unknown  . Biaxin [Clarithromycin] Other (See Comments)    unknown  . Plavix [Clopidogrel Bisulfate] Other (See Comments)    unknown      The results of significant diagnostics from this hospitalization (including imaging, microbiology, ancillary and laboratory) are listed below for reference.    Significant Diagnostic Studies: Dg Chest 2 View  Result Date: 10/08/2015 CLINICAL DATA:  Fall tonight, on anticoagulation. EXAM:  CHEST  2 VIEW COMPARISON:  Chest radiograph 09/28/2015, included lung bases from CT abdomen/pelvis 09/24/2015. FINDINGS: Multi lead right-sided pacemaker. Larger into the cardiac silhouette. Abnormal 1 density abutting the right heart border likely secondary to large pericardial effusion as seen on recent CT. Small bilateral pleural effusions. There is mild vascular congestion. No pneumothorax or new parenchymal opacity. No acute osseous abnormalities are seen, thoracic spine obscured on the lateral view. IMPRESSION: 1. Development of vascular congestion. Stable enlarged cardiac silhouette, pericardial effusion noted on recent prior abdominal CTs. 2. Small bilateral pleural effusions. Electronically Signed   By: Jeb Levering M.D.   On: 10/08/2015 02:32   Dg Chest 2 View  Result Date: 09/28/2015 CLINICAL DATA:  Shortness of breath. EXAM: CHEST  2 VIEW COMPARISON:  06/06/2015 FINDINGS: The pacer wires are stable. Stable cardiac enlargement and tortuous and calcified thoracic aorta. Chronic underlying lung disease with superimposed pleural effusions and bibasilar infiltrates. The bony thorax is intact. IMPRESSION: Stable cardiac enlargement. Bilateral pleural effusions and bibasilar infiltrates. Electronically Signed   By: Marijo Sanes M.D.   On:  09/28/2015 08:27   Dg Forearm Left  Result Date: 10/08/2015 CLINICAL DATA:  [place] tonight EXAM: LEFT FOREARM - 2 VIEW COMPARISON:  None. FINDINGS: There is no evidence of fracture or other focal bone lesions. Soft tissues are unremarkable. IMPRESSION: Negative. Electronically Signed   By: Andreas Newport M.D.   On: 10/08/2015 02:28   Dg Wrist Complete Right  Result Date: 10/08/2015 CLINICAL DATA:  [place] tonight EXAM: RIGHT WRIST - COMPLETE 3+ VIEW COMPARISON:  None. FINDINGS: There is no evidence of fracture or dislocation. There is no evidence of arthropathy or other focal bone abnormality. Soft tissues are unremarkable. IMPRESSION: Negative. Electronically  Signed   By: Andreas Newport M.D.   On: 10/08/2015 02:29   Dg Abd 1 View  Result Date: 09/25/2015 CLINICAL DATA:  Bowel obstruction due to peristomal herniation of small bowel. EXAM: ABDOMEN - 1 VIEW COMPARISON:  CT scans dated 09/24/2015 and radiographs dated 06/11/2015 FINDINGS: The small bowel distention visible on the prior CT scan has resolved. The bowel gas pattern is normal. Colostomy in the left lower quadrant is noted. Aortic atherosclerosis.  Cholecystectomy. IMPRESSION: Resolution of small bowel obstruction. Aortic atherosclerosis. Electronically Signed   By: Lorriane Shire M.D.   On: 09/25/2015 09:39   Ct Head Wo Contrast  Result Date: 10/08/2015 CLINICAL DATA:  [place] today. EXAM: CT HEAD WITHOUT CONTRAST TECHNIQUE: Contiguous axial images were obtained from the base of the skull through the vertex without intravenous contrast. COMPARISON:  05/18/2011 FINDINGS: Brain: There is no intracranial hemorrhage, mass or evidence of acute infarction. Remote infarction in the posterior right frontal convexity. There is mild generalized atrophy. There is mild chronic microvascular ischemic change. There is no significant extra-axial fluid collection. No acute intracranial findings are evident. Vascular: No hyperdense vessel or unexpected calcification. Skull: Normal. Negative for fracture or focal lesion. Sinuses/Orbits: No acute finding. IMPRESSION: No acute intracranial findings. There is mild generalized atrophy and chronic appearing white matter hypodensities which likely represent small vessel ischemic disease. Small remote infarction in the posterior right frontal convexity. Electronically Signed   By: Andreas Newport M.D.   On: 10/08/2015 02:35   Ct Cervical Spine Wo Contrast  Result Date: 09/27/2015 CLINICAL DATA:  82 year old female with recent bowel surgery due to diverticulitis. Cervicothoracic pain radiating to the left shoulder. No known injury. Initial encounter. EXAM: CT CERVICAL SPINE  WITHOUT CONTRAST TECHNIQUE: Multidetector CT imaging of the cervical spine was performed without intravenous contrast. Multiplanar CT image reconstructions were also generated. COMPARISON:  Neck radiographs 10/17/2011. FINDINGS: Study is intermittently, up to moderately degraded by motion artifact despite repeated imaging attempts. Osteopenia. Visualized skull base is intact. No atlanto-occipital dissociation. Straightening and mild reversal of cervical lordosis. Cervicothoracic junction alignment is within normal limits. Bilateral posterior element alignment is within normal limits. No cervical spine fracture identified. Negative visualized posterior fossa. Calcified atherosclerosis at the skull base. Visible tympanic cavities and mastoids are clear. Mild for age calcified carotid atherosclerosis in the neck. Partially retropharyngeal course of the carotids. Otherwise negative noncontrast neck soft tissues. Negative lung apices. Chronic cervical spine disc and posterior element degeneration. Mild degenerative appearing anterolisthesis at C3-C4 and C4-C5. Still, there is only mild if any cervical spinal stenosis suspected, primarily at C5-C6. IMPRESSION: 1. Osteopenia. Motion degraded exam despite repeated imaging attempts. No acute osseous abnormality identified in the cervical spine. 2. Cervical spine degeneration, but only mild if any suspected cervical spinal stenosis. Electronically Signed   By: Genevie Ann M.D.   On: 09/27/2015 15:01  Ct Thoracic Spine Wo Contrast  Result Date: 09/27/2015 CLINICAL DATA:  82 year old female with recent bowel surgery due to diverticulitis. Cervicothoracic pain radiating to the left shoulder. No known injury. Initial encounter. EXAM: CT THORACIC SPINE WITHOUT CONTRAST TECHNIQUE: Multidetector CT imaging of the thoracic spine was performed without intravenous contrast administration. Multiplanar CT image reconstructions were also generated. COMPARISON:  Cervical spine CT from  today reported separately. Thoracic spine radiographs 08/28/2015. Chest CT 07/04/2011. FINDINGS: Osteopenia. Normal thoracic segmentation. Stable mild exaggeration of thoracic kyphosis since 2013. Thoracic vertebrae appear intact. Posterior ribs appear intact. Visible upper lumbar levels appear intact. No CT evidence of thoracic spinal stenosis. Mild for age thoracic spine degeneration. Calcified aortic atherosclerosis. Cardiomegaly. Pericardial effusion not well visualized today. Small to moderate right and small left layering pleural effusions. Negative visualized noncontrast upper abdominal viscera. Patchy and confluent left lower lobe pulmonary opacity, appears peribronchial and inflammatory in nature. IMPRESSION: 1. Osteopenia. No acute osseous abnormality identified. In the thoracic spine. Mild for age thoracic spine degeneration. 2. Confluent left lower lobe pulmonary opacity could reflect aspiration or pneumonia in this setting. 3. Moderate layering right and small layering left pleural effusions. 4.  Calcified aortic atherosclerosis. 5. Cardiomegaly. Large pericardial effusion better demonstrated on CT Abdomen and Pelvis 09/24/2015 (please see that report). Electronically Signed   By: Genevie Ann M.D.   On: 09/27/2015 15:07   Dg Chest Port 1 View  Result Date: 10/13/2015 CLINICAL DATA:  CHF. EXAM: PORTABLE CHEST 1 VIEW COMPARISON:  10/11/2015 FINDINGS: Marked cardiomegaly again noted with pulmonary vascular congestion. Bilateral lower lung atelectasis/ consolidation again noted, increased on the left. A right-sided pacemaker is again identified. There is no evidence of pneumothorax or other significant change. IMPRESSION: Bilateral lower lung atelectasis/ consolidation, increased on the left. Cardiomegaly and pulmonary vascular congestion again noted. Electronically Signed   By: Margarette Canada M.D.   On: 10/13/2015 09:57   Dg Chest Port 1 View  Result Date: 10/11/2015 CLINICAL DATA:  CHF EXAM: PORTABLE  CHEST 1 VIEW COMPARISON:  10/10/2015, 10/08/2015 FINDINGS: Multiple support leads over the patient obscure the chest. Right-sided pacing device is similar compared to previous. Marked enlargement of the cardiomediastinal silhouette with globular cardiac configuration. Small bilateral pleural effusions are slightly decreased. Persistent dense airspace consolidation within the bilateral lung bases. Diffuse interstitial edema appears slightly decreased. Atherosclerosis of the aorta. No pneumothorax. IMPRESSION: 1. Marked enlargement of the cardiomediastinal silhouette with globular cardiac configuration, pericardial effusion versus multi chamber enlargement. This does not appear significantly changed in the interim. 2. Slight decrease in small bilateral pleural effusions. Aeration within the lower lung zones is slightly improved. 3. Continued vascular congestion, mildly decreased. Diffuse background interstitial edema also appears slightly decreased. 4. Atherosclerotic vascular disease of the aorta Electronically Signed   By: Donavan Foil M.D.   On: 10/11/2015 10:11   Dg Chest Port 1v Same Day  Result Date: 10/10/2015 CLINICAL DATA:  Shortness of breath, history of chronic CHF, pericardial effusion, previous CVA, recent ORIF for left hip fracture. EXAM: PORTABLE CHEST 1 VIEW COMPARISON:  Chest x-ray of October 08, 2015 FINDINGS: The cardiac silhouette remains enlarged. The pulmonary vascularity remains engorged. The left hemidiaphragm is now obscured and there is partial obscuration of the right hemidiaphragm. The permanent pacemaker is in stable position. There is calcification in the wall of the aortic arch. There is no pneumothorax. The bony thorax exhibits no acute abnormality. IMPRESSION: CHF with pulmonary vascular congestion and interstitial edema more conspicuous today. Probable small bilateral  pleural effusions. Aortic atherosclerosis. Electronically Signed   By: David  Martinique M.D.   On: 10/10/2015  07:45   Dg C-arm 1-60 Min  Result Date: 10/09/2015 CLINICAL DATA:  Left femur IM nail for intertrochanteric femur fracture. EXAM: DG C-ARM 61-120 MIN; OPERATIVE LEFT HIP WITH PELVIS COMPARISON:  10/08/2015 FINDINGS: Two intraoperative spot fluoro films are submitted. The patient is status post ORIF for intertrochanteric left hip fracture. Two proximal cannulated screws are evident with a long intra great intra medullary femoral nail, incompletely visualized. Bony alignment is improved compared to the pre reduction study. IMPRESSION: Status post ORIF for intertrochanteric left femur fracture. No evidence for immediate hardware complications within the visualized portions of the femur. Electronically Signed   By: Misty Stanley M.D.   On: 10/09/2015 17:57   Ct Renal Stone Study  Result Date: 09/24/2015 CLINICAL DATA:  Acute on chronic back pain. Left flank pain. Leukocytosis. EXAM: CT ABDOMEN AND PELVIS WITHOUT CONTRAST TECHNIQUE: Multidetector CT imaging of the abdomen and pelvis was performed following the standard protocol without IV contrast. COMPARISON:  09/23/2015 FINDINGS: Lower chest: Large and possibly loculated pericardial effusion, similar to prior. Extensive coronary atherosclerosis. Calcified mitral valve. Pacer leads. Moderate cardiomegaly. Mild atelectasis in the right middle lobe and lingula. Mild atelectasis in both lower lobes. Hepatobiliary: Cholecystectomy Pancreas: Unremarkable Spleen: Unremarkable Adrenals/Urinary Tract: Abnormal appearance the kidneys with cortical contrast remaining on board, but some clearing of the medullary contrast. As far as I am aware the patient received contrast 33 hours prior to today's scan, the persistent contrast in the cortex is indicative of renal dysfunction. There is some excreted contrast in the collecting systems in urinary bladder is well. Scarring in the kidneys, right greater than left. Bilateral renal cysts. Stomach/Bowel: Small bowel obstruction  due to a peristomal hernia of small bowel, about the junction of the jejunum and ileum, extending above the descending colostomy site. Small bowel loops up to 4.1 cm. Distended stomach. Appendix normal. Rectal stump unremarkable. Vascular/Lymphatic: Aortoiliac atherosclerotic vascular disease. Scattered small retroperitoneal lymph nodes. Reproductive: Unremarkable Other: Diffuse subcutaneous and mesenteric edema, query generalized third spacing of fluid. Edema is most notable along the lower anterior pelvic wall and pubis region. Musculoskeletal: Lumbar spondylosis and degenerative disc disease. IMPRESSION: 1. Small bowel obstruction due to a peristomal herniation of small bowel just above the descending colostomy. Small bowel dilated up to 4.1 cm in the stomach is distended. 2. Abnormal appearance the kidneys, with retained contrast medium in the renal cortex bilaterally, cannot exclude acute renal failure or contrast nephropathy causing this appearance. The patient last received IV contrast about 33 hours prior to today's scan, as best I know. 3. Stable large and possibly loculated pericardial effusion with extensive coronary atherosclerosis, calcified mitral valve, and moderate cardiomegaly. 4.  Aortoiliac atherosclerotic vascular disease. 5. Third spacing of fluid with generalized mesenteric and subcutaneous edema. 6. I do not see any renal stones but sensitivity for nonobstructive stones is low due to the presence of contrast medium within the urinary tract left over from yesterday. Electronically Signed   By: Van Clines M.D.   On: 09/24/2015 20:14   Dg Hip Operative Unilat W Or W/o Pelvis Left  Result Date: 10/09/2015 CLINICAL DATA:  Left femur IM nail for intertrochanteric femur fracture. EXAM: DG C-ARM 61-120 MIN; OPERATIVE LEFT HIP WITH PELVIS COMPARISON:  10/08/2015 FINDINGS: Two intraoperative spot fluoro films are submitted. The patient is status post ORIF for intertrochanteric left hip  fracture. Two proximal cannulated screws are evident  with a long intra great intra medullary femoral nail, incompletely visualized. Bony alignment is improved compared to the pre reduction study. IMPRESSION: Status post ORIF for intertrochanteric left femur fracture. No evidence for immediate hardware complications within the visualized portions of the femur. Electronically Signed   By: Misty Stanley M.D.   On: 10/09/2015 17:57   Dg Hip Unilat W Or Wo Pelvis 2-3 Views Left  Result Date: 10/08/2015 CLINICAL DATA:  [place] fall tonight. EXAM: DG HIP (WITH OR WITHOUT PELVIS) 2-3V LEFT COMPARISON:  None. FINDINGS: There is an acute intertrochanteric left hip fracture with mild varus angulation. No dislocation. No radiographic findings to suggest a pathologic basis for the fracture. IMPRESSION: Intertrochanteric left hip fracture. Electronically Signed   By: Andreas Newport M.D.   On: 10/08/2015 02:28    Microbiology: No results found for this or any previous visit (from the past 240 hour(s)).   Labs: Basic Metabolic Panel:  Recent Labs Lab 10/15/15 0529 10/16/15 0405 10/17/15 0407 10/18/15 0505  NA 137 138 141 142  K 4.4 3.8 3.5 3.2*  CL 98* 101 102 101  CO2 29 28 30 31   GLUCOSE 92 107* 110* 114*  BUN 65* 53* 41* 31*  CREATININE 1.63* 1.28* 1.01* 0.98  CALCIUM 8.9 8.9 9.2 9.1  MG  --   --   --  2.6*   Liver Function Tests: No results for input(s): AST, ALT, ALKPHOS, BILITOT, PROT, ALBUMIN in the last 168 hours. No results for input(s): LIPASE, AMYLASE in the last 168 hours. No results for input(s): AMMONIA in the last 168 hours. CBC:  Recent Labs Lab 10/15/15 0529 10/16/15 0405 10/17/15 0407 10/18/15 0505  WBC 5.7 5.8 6.1 7.1  HGB 9.0* 8.3* 9.1* 9.5*  HCT 30.3* 28.5* 31.2* 33.0*  MCV 73.9* 74.6* 75.2* 75.9*  PLT 349 316 316 332   Cardiac Enzymes: No results for input(s): CKTOTAL, CKMB, CKMBINDEX, TROPONINI in the last 168 hours. BNP: BNP (last 3 results)  Recent  Labs  07/29/15 1107 09/24/15 1839 10/11/15 0335  BNP 426.4* 406.0* 582.3*    ProBNP (last 3 results) No results for input(s): PROBNP in the last 8760 hours.  CBG: No results for input(s): GLUCAP in the last 168 hours.     SignedDomenic Polite MD.  [facility] 10/21/2015, 12:00 PM

## 2015-10-21 NOTE — Care Management Important Message (Signed)
Important Message  Patient Details  Name: [name] MRN: [id number] Date of Birth: [birth year]   Medicare Important Message Given:  Yes    Zenon Mayo, RN 10/21/2015, 12:25 [place] Message  Patient Details  Name: [name] MRN: [id number] Date of Birth: 04-24-1923   Medicare Important Message Given:  Yes    Zenon Mayo, RN 10/21/2015, 12:25 PM

## 2015-10-21 NOTE — Clinical Social Work Note (Signed)
Per RNCM, patient's daughter/POA, Clarene Critchley interested in residential hospice. First preference [facility]. CSW met with Clarene Critchley in patient's room and confirmed information. Per MD via telephone conversation, patient can discharge when bed available. CSW made referral to [facility].  Dayton Scrape, [place]

## 2015-10-21 NOTE — Progress Notes (Signed)
Late Entry: Wasted 35 mL of Dilaudid in sink from gtt. Second verification by Marlaine Hind. Leanne Chang, RN

## 2015-10-21 NOTE — Progress Notes (Signed)
Pt being discharged to [facility] via transport. Report called to nurse. Pt alert and oriented x1. VSS. Pt c/o no pain at this time. No signs of respiratory distress.On 2L/[place]. Education complete and care plans resolved. IV removed with catheter intact and pt tolerated well. No further issues at this time. Leanne Chang, RN

## 2015-10-21 NOTE — Progress Notes (Signed)
PT Cancellation Note  Patient Details Name: [name] MRN: [id number] DOB: 24-Jan-1924   Cancelled Treatment:    Reason Eval/Treat Not Completed: Other (comment).  Looks like pt is due to d/c to [facility] today for end of life care.  PT to sign off and defer any further attempts at mobility to [facility].    Thanks,    Barbarann Ehlers. [place], [place], DPT [phone]   10/21/2015, 12:26 PM

## 2015-10-21 NOTE — Care Management Note (Addendum)
Case Management Note  Patient Details  Name: AVANNAH GUERRIERO MRN: [id number] Date of Birth: Mar 28, 1923  Subjective/Objective:   NCM  Spoke with Palliative MD Rowe Pavy and they are in agreement with making patient GIP and transferring to 6N if attending is in agreement.  NCM notified attending.   Attending is in agreement as well.  NCM spoke to daughter about residential hospice, daughter is interested in residential hospice for her mother. NCM notified Dr. Broadus John and Palliative MD Dr. Waynard Reeds and CSW Clarise Cruz.  NCM spoke with Collie Siad with [facility] ,she states they can du dilaudid drips at residential.  CSW referral.         Action/Plan:   Expected Discharge Date:                  Expected Discharge Plan:  [place] (Unknown)  In-House Referral:  Clinical Social Work  Discharge planning Services  CM Consult  Post Acute Care Choice:  NA Choice offered to:  NA  DME Arranged:    DME Agency:     HH Arranged:    Justice Agency:     Status of Service:  In process, will continue to follow  If discussed at Long Length of Stay Meetings, dates discussed:    Additional Comments:  Zenon Mayo, RN 10/21/2015, 10:07 AM

## 2015-10-21 NOTE — Consult Note (Addendum)
[facility] [employer]  Received request from [place] for family interest in [facility]. Chart reviewed and met with daughter Bartholome Bill at bedside to explain services and confirm interest. Helene Kelp completed paper work for transfer today. Dr. Orpah Melter to assume care per family preference. Discharge summary has been faxed. CSW Judson Roch is aware and arranging transport.   RN please call report to [phone].  Thank you.  Erling Conte, [place]

## 2015-10-21 NOTE — Progress Notes (Signed)
Patient is resting comfortably.  She has been made comfort care and plan is for hospital death per palliative care.  Ortho available as needed.  Azucena Cecil, MD [place] 8:09 AM

## 2015-10-23 ENCOUNTER — Ambulatory Visit: Payer: Medicare Other | Admitting: Cardiovascular Disease

## 2015-10-25 ENCOUNTER — Ambulatory Visit: Payer: Self-pay | Admitting: Pharmacist Clinician (PhC)/ Clinical Pharmacy Specialist

## 2015-10-25 DIAGNOSIS — I6310 Cerebral infarction due to embolism of unspecified precerebral artery: Secondary | ICD-10-CM

## 2015-10-25 DIAGNOSIS — I4821 Permanent atrial fibrillation: Secondary | ICD-10-CM

## 2015-10-25 DIAGNOSIS — Z7901 Long term (current) use of anticoagulants: Secondary | ICD-10-CM

## 2015-10-27 DEATH — deceased

## 2015-12-04 ENCOUNTER — Ambulatory Visit: Payer: Medicare Other | Admitting: Cardiovascular Disease

## 2018-05-10 IMAGING — DX DG ABDOMEN 2V
2 series · 2 of 2 positions shown · non-contrast
Comparison: 05/30/2015

CLINICAL DATA: Emesis.

EXAM:
ABDOMEN - 2 VIEW

[abdomen supine]
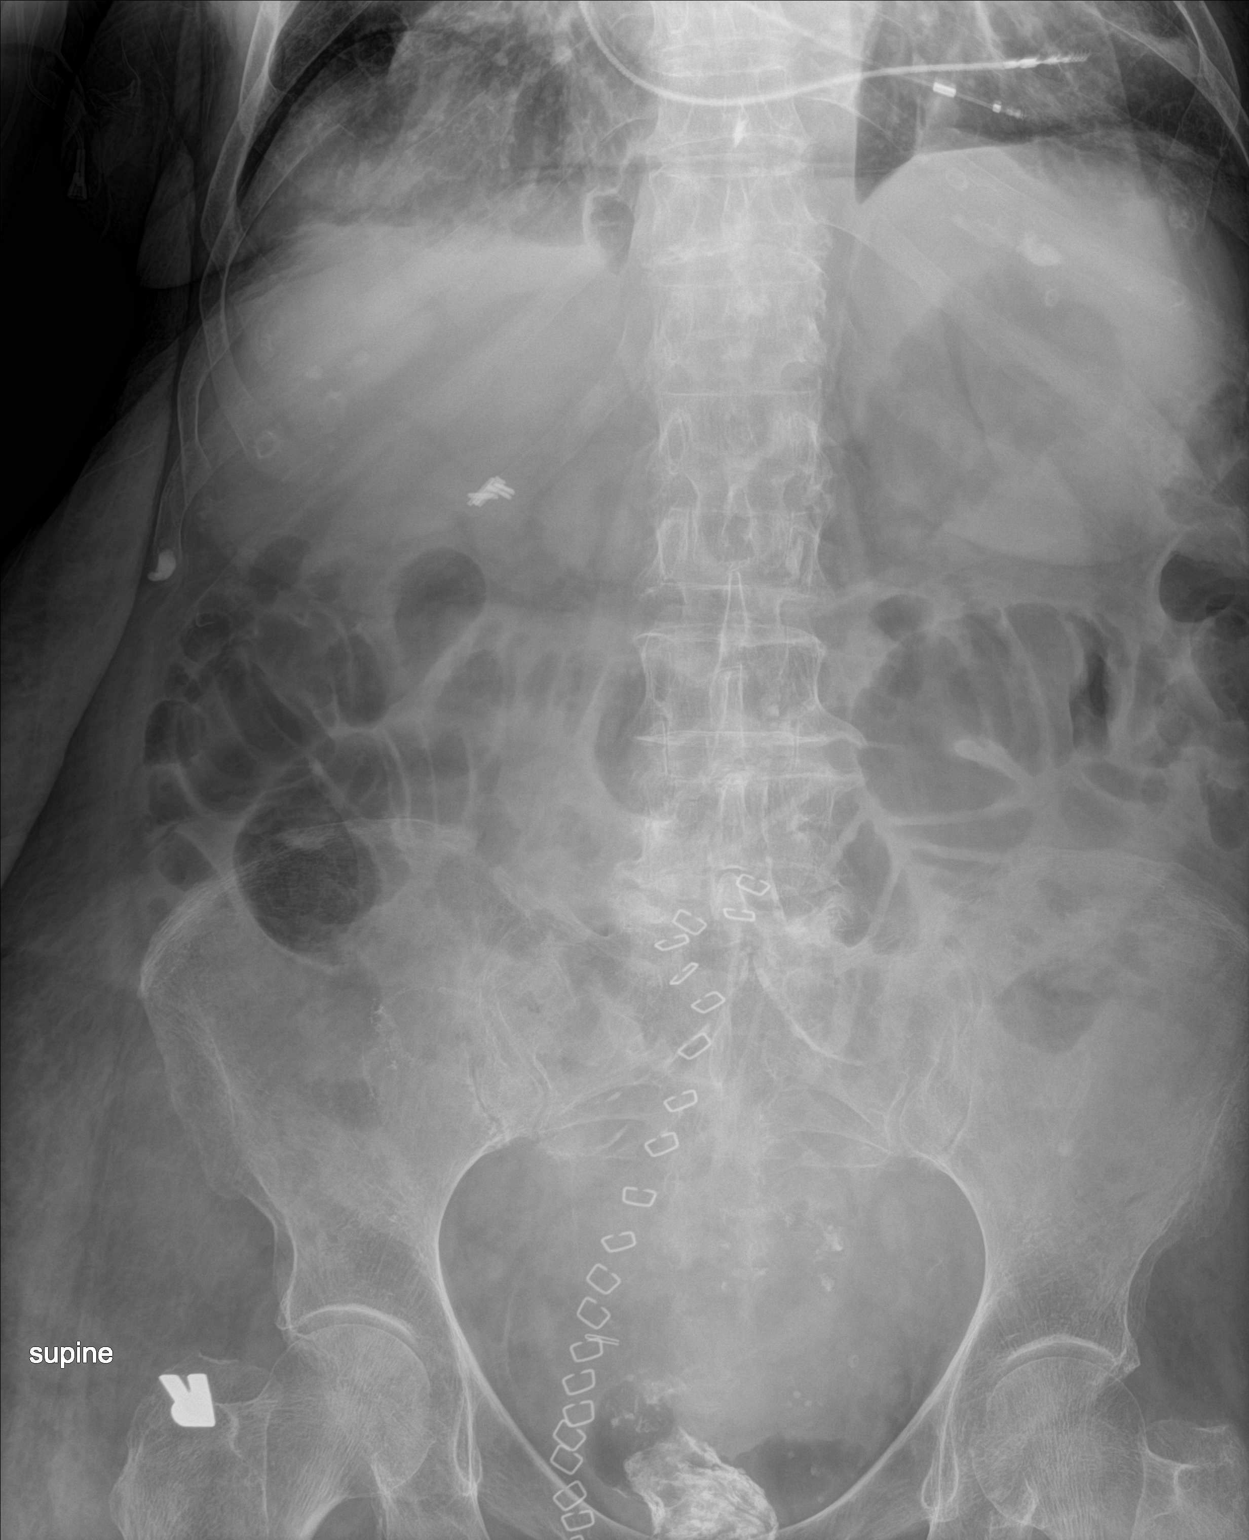

[abdomen decu]
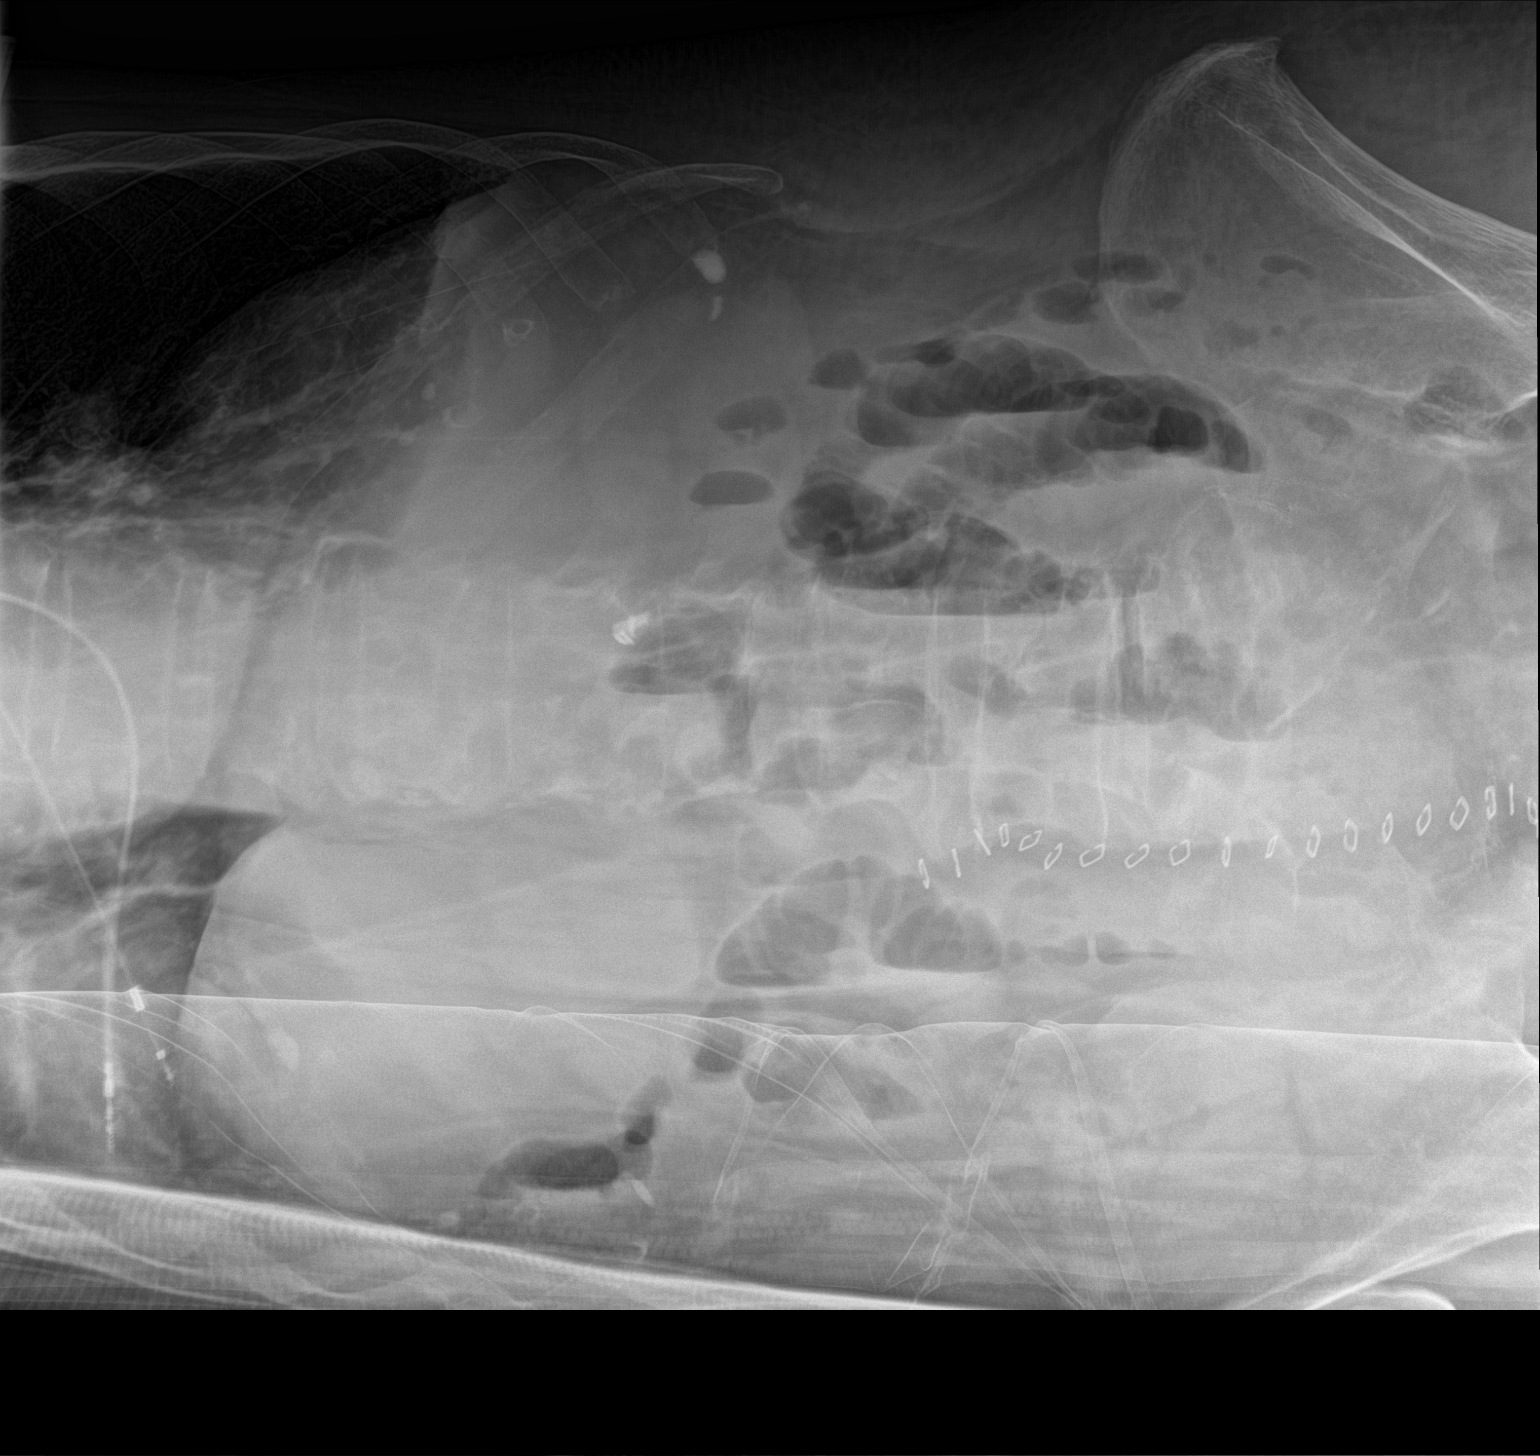

[2 of 2 positions shown; findings below may reference images not displayed]

FINDINGS: Gaseous distension of the bowel loops are identified. This
predominantly involves the small bowel loops which measure up to
cm. Numerous air-fluid levels are identified on the left lateral
decubitus radiograph.
IMPRESSION: 1. Small bowel dilatation with air-fluid levels. Findings may
reflect small bowel obstruction versus ileus.

## 2018-05-14 IMAGING — CR DG ABD PORTABLE 2V
3 series · 3 of 3 positions shown · non-contrast
Comparison: 06/07/2015

CLINICAL DATA: Abdominal pain at the left lower surgical site this
afternoon. Status post sigmoid colectomy with descending colon
colostomy and drainage of a pelvic abscess.

EXAM:
PORTABLE ABDOMEN - 2 VIEW

[AP]
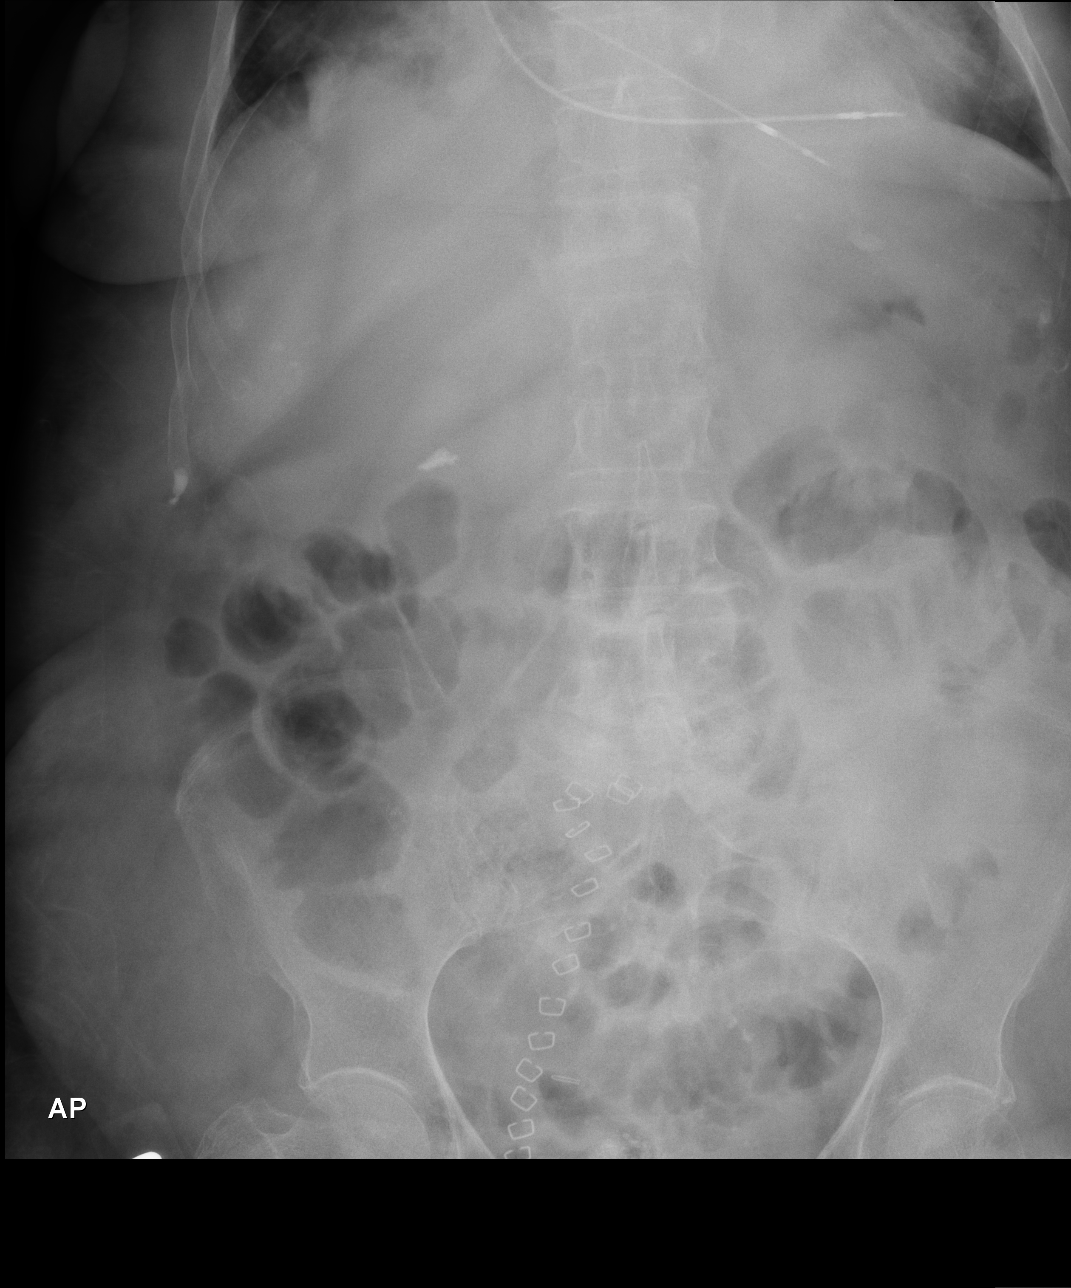

[pa lld]
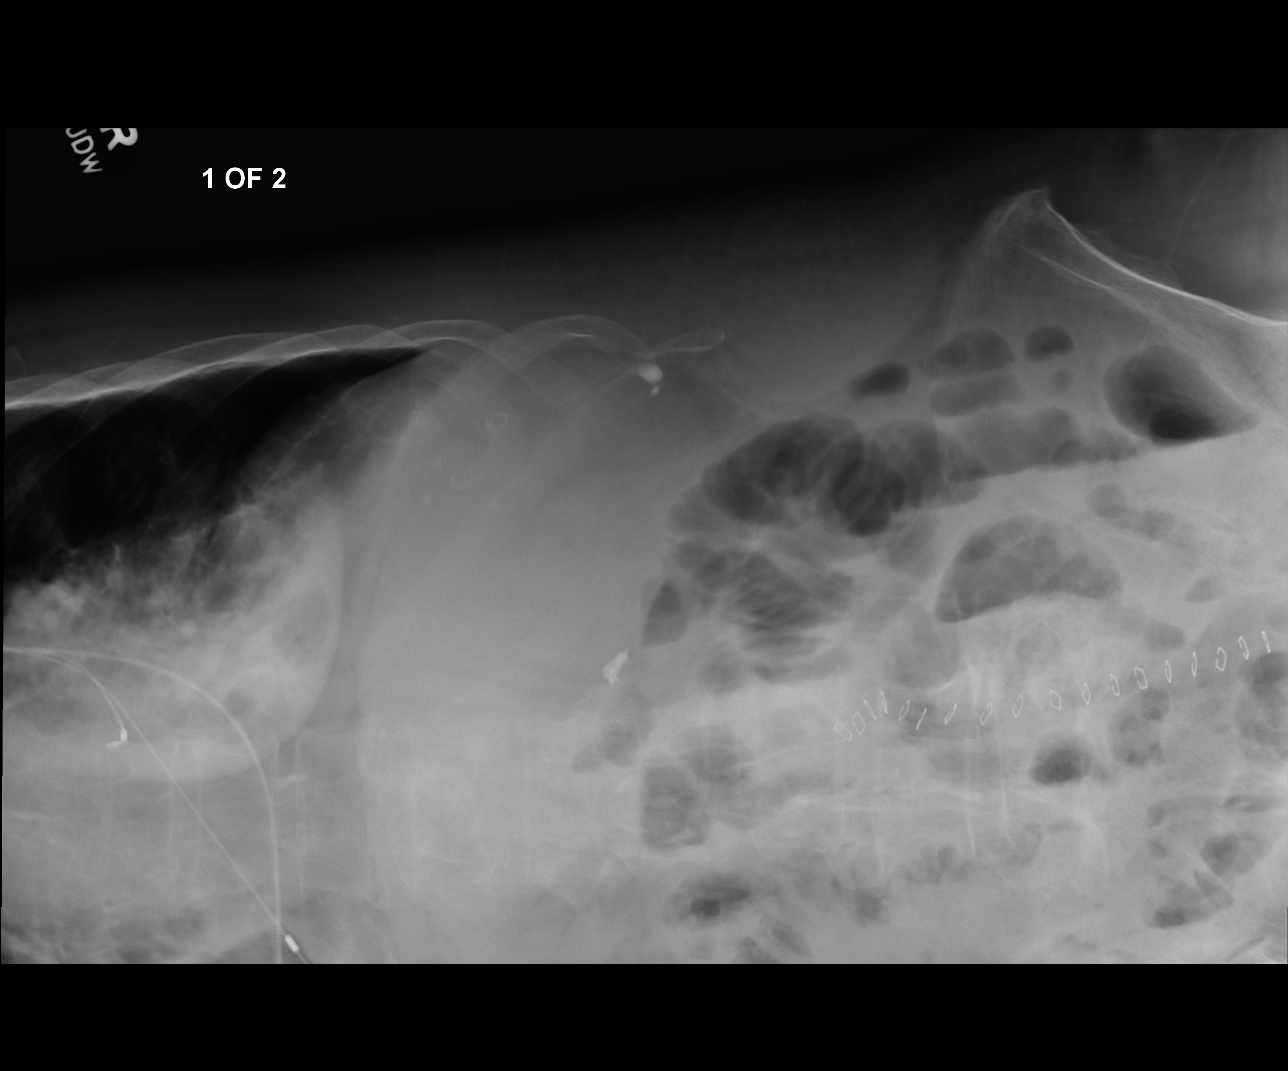

[ap lld]
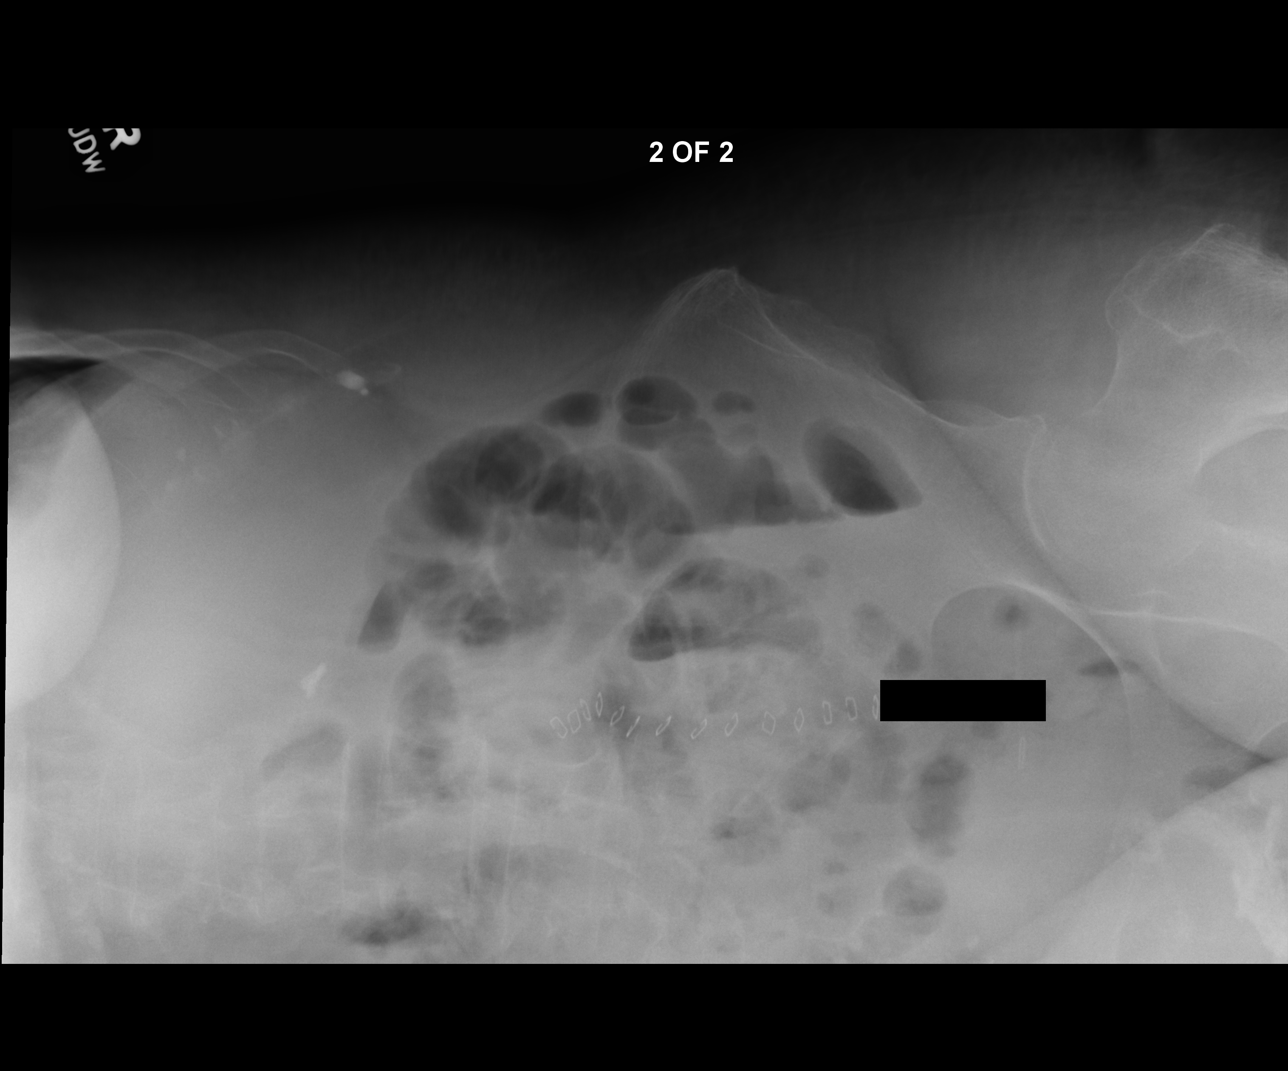

[3 of 3 positions shown; findings below may reference images not displayed]

FINDINGS: Mild dilation of loops of small bowel is evident, decreased when
compared to the prior radiographs. There are air-fluid levels on the
decubitus view. Findings are consistent with a diffuse adynamic
ileus. No free air.

Residual contrast is noted in the rectum.

Clips are upper quadrant reflect a prior cholecystectomy, stable.
Surgical skin staples lie along the low anterior abdominal wall,
also stable.
IMPRESSION: 1. Although there is less small bowel dilation on the prior study,
there is still mild small bowel dilation with multiple air-fluid
levels consistent with an adynamic ileus. No convincing obstruction.
No free air.
